# Patient Record
Sex: Female | Born: 1960
Health system: Southern US, Community
[De-identification: ages and names within clinical notes are randomized; demographics above are authoritative.]

## PROBLEM LIST (undated history)

## (undated) ENCOUNTER — Ambulatory Visit (HOSPITAL_COMMUNITY): Admission: EM | Payer: Medicare Other | Source: Home / Self Care

## (undated) DIAGNOSIS — N189 Chronic kidney disease, unspecified: Secondary | ICD-10-CM

## (undated) DIAGNOSIS — I509 Heart failure, unspecified: Secondary | ICD-10-CM

## (undated) DIAGNOSIS — I209 Angina pectoris, unspecified: Secondary | ICD-10-CM

## (undated) DIAGNOSIS — K219 Gastro-esophageal reflux disease without esophagitis: Secondary | ICD-10-CM

## (undated) DIAGNOSIS — A412 Sepsis due to unspecified staphylococcus: Secondary | ICD-10-CM

## (undated) DIAGNOSIS — R194 Change in bowel habit: Secondary | ICD-10-CM

## (undated) DIAGNOSIS — R768 Other specified abnormal immunological findings in serum: Secondary | ICD-10-CM

## (undated) DIAGNOSIS — R0600 Dyspnea, unspecified: Secondary | ICD-10-CM

## (undated) DIAGNOSIS — I4891 Unspecified atrial fibrillation: Secondary | ICD-10-CM

## (undated) DIAGNOSIS — J45909 Unspecified asthma, uncomplicated: Secondary | ICD-10-CM

## (undated) DIAGNOSIS — I10 Essential (primary) hypertension: Secondary | ICD-10-CM

## (undated) HISTORY — PX: ABDOMINAL HYSTERECTOMY: SHX81

---

## 2007-06-05 ENCOUNTER — Emergency Department (HOSPITAL_COMMUNITY): Admission: EM | Admit: 2007-06-05 | Discharge: 2007-06-05 | Payer: Self-pay | Admitting: *Deleted

## 2008-03-02 ENCOUNTER — Emergency Department (HOSPITAL_COMMUNITY): Admission: EM | Admit: 2008-03-02 | Discharge: 2008-03-02 | Payer: Self-pay | Admitting: Emergency Medicine

## 2008-03-07 ENCOUNTER — Emergency Department (HOSPITAL_COMMUNITY): Admission: EM | Admit: 2008-03-07 | Discharge: 2008-03-07 | Payer: Self-pay | Admitting: Emergency Medicine

## 2009-11-13 ENCOUNTER — Inpatient Hospital Stay (HOSPITAL_COMMUNITY): Admission: EM | Admit: 2009-11-13 | Discharge: 2009-11-16 | Payer: Self-pay | Admitting: Emergency Medicine

## 2010-08-16 LAB — CBC
HCT: 36 % (ref 36.0–46.0)
HCT: 37.2 % (ref 36.0–46.0)
HCT: 42.5 % (ref 36.0–46.0)
Hemoglobin: 12.5 g/dL (ref 12.0–15.0)
Hemoglobin: 12.6 g/dL (ref 12.0–15.0)
Hemoglobin: 14.6 g/dL (ref 12.0–15.0)
MCHC: 33.8 g/dL (ref 30.0–36.0)
MCHC: 34.3 g/dL (ref 30.0–36.0)
MCHC: 34.7 g/dL (ref 30.0–36.0)
MCV: 100.5 fL — ABNORMAL HIGH (ref 78.0–100.0)
MCV: 101.5 fL — ABNORMAL HIGH (ref 78.0–100.0)
MCV: 102.5 fL — ABNORMAL HIGH (ref 78.0–100.0)
Platelets: 102 10*3/uL — ABNORMAL LOW (ref 150–400)
Platelets: 107 10*3/uL — ABNORMAL LOW (ref 150–400)
Platelets: 99 10*3/uL — ABNORMAL LOW (ref 150–400)
RBC: 3.54 MIL/uL — ABNORMAL LOW (ref 3.87–5.11)
RBC: 3.63 MIL/uL — ABNORMAL LOW (ref 3.87–5.11)
RBC: 4.23 MIL/uL (ref 3.87–5.11)
RDW: 13 % (ref 11.5–15.5)
RDW: 13.1 % (ref 11.5–15.5)
RDW: 13.3 % (ref 11.5–15.5)
WBC: 10.6 10*3/uL — ABNORMAL HIGH (ref 4.0–10.5)
WBC: 10.6 10*3/uL — ABNORMAL HIGH (ref 4.0–10.5)
WBC: 8.9 10*3/uL (ref 4.0–10.5)

## 2010-08-16 LAB — COMPREHENSIVE METABOLIC PANEL
ALT: 81 U/L — ABNORMAL HIGH (ref 0–35)
AST: 65 U/L — ABNORMAL HIGH (ref 0–37)
Albumin: 2.8 g/dL — ABNORMAL LOW (ref 3.5–5.2)
Alkaline Phosphatase: 60 U/L (ref 39–117)
BUN: 5 mg/dL — ABNORMAL LOW (ref 6–23)
CO2: 22 mEq/L (ref 19–32)
Calcium: 8.1 mg/dL — ABNORMAL LOW (ref 8.4–10.5)
Chloride: 105 mEq/L (ref 96–112)
Creatinine, Ser: 0.9 mg/dL (ref 0.4–1.2)
GFR calc Af Amer: >60 mL/min (ref >60)
GFR calc non Af Amer: >60 mL/min (ref >60)
Glucose, Bld: 150 mg/dL — ABNORMAL HIGH (ref 70–99)
Potassium: 3.5 mEq/L (ref 3.5–5.1)
Sodium: 134 mEq/L — ABNORMAL LOW (ref 135–145)
Total Bilirubin: 0.8 mg/dL (ref 0.3–1.2)
Total Protein: 6 g/dL (ref 6.0–8.3)

## 2010-08-16 LAB — URINE MICROSCOPIC-ADD ON

## 2010-08-16 LAB — BASIC METABOLIC PANEL
BUN: 3 mg/dL — ABNORMAL LOW (ref 6–23)
CO2: 23 mEq/L (ref 19–32)
Calcium: 7.9 mg/dL — ABNORMAL LOW (ref 8.4–10.5)
Chloride: 106 mEq/L (ref 96–112)
Creatinine, Ser: 0.72 mg/dL (ref 0.4–1.2)
GFR calc Af Amer: >60 mL/min (ref >60)
GFR calc non Af Amer: >60 mL/min (ref >60)
Glucose, Bld: 92 mg/dL (ref 70–99)
Potassium: 3.7 mEq/L (ref 3.5–5.1)
Sodium: 132 mEq/L — ABNORMAL LOW (ref 135–145)

## 2010-08-16 LAB — URINALYSIS, ROUTINE W REFLEX MICROSCOPIC
Bilirubin Urine: NEGATIVE
Glucose, UA: NEGATIVE mg/dL
Ketones, ur: 15 mg/dL — AB
Nitrite: POSITIVE — AB
Protein, ur: 100 mg/dL — AB
Specific Gravity, Urine: 1.012 (ref 1.005–1.030)
Urobilinogen, UA: 1 mg/dL (ref 0.0–1.0)
pH: 6 (ref 5.0–8.0)

## 2010-08-16 LAB — URINE CULTURE: Colony Count: >=100000

## 2010-08-16 LAB — POCT I-STAT, CHEM 8
BUN: 4 mg/dL — ABNORMAL LOW (ref 6–23)
Calcium, Ion: 1.09 mmol/L — ABNORMAL LOW (ref 1.12–1.32)
Chloride: 102 mEq/L (ref 96–112)
Creatinine, Ser: 0.9 mg/dL (ref 0.4–1.2)
Glucose, Bld: 108 mg/dL — ABNORMAL HIGH (ref 70–99)
HCT: 45 % (ref 36.0–46.0)
Hemoglobin: 15.3 g/dL — ABNORMAL HIGH (ref 12.0–15.0)
Potassium: 3.1 mEq/L — ABNORMAL LOW (ref 3.5–5.1)
Sodium: 138 mEq/L (ref 135–145)
TCO2: 26 mmol/L (ref 0–100)

## 2010-08-16 LAB — CULTURE, BLOOD (ROUTINE X 2)
Culture: NO GROWTH
Culture: NO GROWTH

## 2010-08-16 LAB — HEPATITIS PANEL, ACUTE
HCV Ab: REACTIVE — AB
Hep A IgM: NEGATIVE
Hep B C IgM: NEGATIVE
Hepatitis B Surface Ag: NEGATIVE

## 2010-08-16 LAB — HEPATIC FUNCTION PANEL
ALT: 104 U/L — ABNORMAL HIGH (ref 0–35)
AST: 90 U/L — ABNORMAL HIGH (ref 0–37)
Albumin: 3.5 g/dL (ref 3.5–5.2)
Alkaline Phosphatase: 65 U/L (ref 39–117)
Bilirubin, Direct: 0.3 mg/dL (ref 0.0–0.3)
Indirect Bilirubin: 0.8 mg/dL (ref 0.3–0.9)
Total Bilirubin: 1.1 mg/dL (ref 0.3–1.2)
Total Protein: 7.2 g/dL (ref 6.0–8.3)

## 2010-08-16 LAB — DIFFERENTIAL
Basophils Absolute: 0 10*3/uL (ref 0.0–0.1)
Basophils Relative: 0 % (ref 0–1)
Eosinophils Absolute: 0 10*3/uL (ref 0.0–0.7)
Eosinophils Relative: 0 % (ref 0–5)
Lymphocytes Relative: 10 % — ABNORMAL LOW (ref 12–46)
Lymphs Abs: 1.1 10*3/uL (ref 0.7–4.0)
Monocytes Absolute: 0.9 10*3/uL (ref 0.1–1.0)
Monocytes Relative: 9 % (ref 3–12)
Neutro Abs: 8.5 10*3/uL — ABNORMAL HIGH (ref 1.7–7.7)
Neutrophils Relative %: 81 % — ABNORMAL HIGH (ref 43–77)

## 2010-08-16 LAB — PROTIME-INR
INR: 0.97 (ref 0.00–1.49)
Prothrombin Time: 12.8 seconds (ref 11.6–15.2)

## 2010-08-16 LAB — MAGNESIUM: Magnesium: 1.5 mg/dL (ref 1.5–2.5)

## 2010-08-16 LAB — LIPASE, BLOOD: Lipase: 20 U/L (ref 11–59)

## 2011-02-18 LAB — URINE MICROSCOPIC-ADD ON

## 2011-02-18 LAB — POCT PREGNANCY, URINE
Operator id: 26520
Preg Test, Ur: NEGATIVE

## 2011-02-18 LAB — URINALYSIS, ROUTINE W REFLEX MICROSCOPIC
Bilirubin Urine: NEGATIVE
Glucose, UA: NEGATIVE
Hgb urine dipstick: NEGATIVE
Ketones, ur: NEGATIVE
Leukocytes, UA: NEGATIVE
Nitrite: NEGATIVE
Protein, ur: 30 — AB
Specific Gravity, Urine: 1.013
Urobilinogen, UA: 1
pH: 6.5

## 2012-11-03 ENCOUNTER — Encounter (HOSPITAL_COMMUNITY): Payer: Self-pay | Admitting: *Deleted

## 2012-11-03 ENCOUNTER — Inpatient Hospital Stay (HOSPITAL_COMMUNITY): Payer: BC Managed Care – PPO

## 2012-11-03 ENCOUNTER — Inpatient Hospital Stay (HOSPITAL_COMMUNITY)
Admission: EM | Admit: 2012-11-03 | Discharge: 2012-11-05 | DRG: 551 | Disposition: A | Discharge status: Home / Self Care | Payer: BC Managed Care – PPO | Attending: Internal Medicine | Admitting: Internal Medicine

## 2012-11-03 ENCOUNTER — Emergency Department (HOSPITAL_COMMUNITY): Payer: BC Managed Care – PPO

## 2012-11-03 DIAGNOSIS — D696 Thrombocytopenia, unspecified: Secondary | ICD-10-CM | POA: Diagnosis present

## 2012-11-03 DIAGNOSIS — R894 Abnormal immunological findings in specimens from other organs, systems and tissues: Secondary | ICD-10-CM | POA: Diagnosis present

## 2012-11-03 DIAGNOSIS — D649 Anemia, unspecified: Secondary | ICD-10-CM

## 2012-11-03 DIAGNOSIS — K921 Melena: Secondary | ICD-10-CM

## 2012-11-03 DIAGNOSIS — K319 Disease of stomach and duodenum, unspecified: Secondary | ICD-10-CM | POA: Diagnosis present

## 2012-11-03 DIAGNOSIS — E860 Dehydration: Secondary | ICD-10-CM | POA: Diagnosis present

## 2012-11-03 DIAGNOSIS — N281 Cyst of kidney, acquired: Secondary | ICD-10-CM | POA: Diagnosis present

## 2012-11-03 DIAGNOSIS — R079 Chest pain, unspecified: Secondary | ICD-10-CM | POA: Diagnosis present

## 2012-11-03 DIAGNOSIS — I10 Essential (primary) hypertension: Secondary | ICD-10-CM

## 2012-11-03 DIAGNOSIS — K279 Peptic ulcer, site unspecified, unspecified as acute or chronic, without hemorrhage or perforation: Secondary | ICD-10-CM | POA: Diagnosis present

## 2012-11-03 DIAGNOSIS — K859 Acute pancreatitis without necrosis or infection, unspecified: Secondary | ICD-10-CM

## 2012-11-03 DIAGNOSIS — D500 Iron deficiency anemia secondary to blood loss (chronic): Secondary | ICD-10-CM | POA: Diagnosis present

## 2012-11-03 DIAGNOSIS — F17200 Nicotine dependence, unspecified, uncomplicated: Secondary | ICD-10-CM | POA: Diagnosis present

## 2012-11-03 DIAGNOSIS — K922 Gastrointestinal hemorrhage, unspecified: Secondary | ICD-10-CM

## 2012-11-03 DIAGNOSIS — R109 Unspecified abdominal pain: Secondary | ICD-10-CM

## 2012-11-03 DIAGNOSIS — R740 Nonspecific elevation of levels of transaminase and lactic acid dehydrogenase [LDH]: Secondary | ICD-10-CM

## 2012-11-03 DIAGNOSIS — K7460 Unspecified cirrhosis of liver: Secondary | ICD-10-CM

## 2012-11-03 DIAGNOSIS — K766 Portal hypertension: Secondary | ICD-10-CM | POA: Diagnosis present

## 2012-11-03 DIAGNOSIS — A498 Other bacterial infections of unspecified site: Secondary | ICD-10-CM | POA: Diagnosis present

## 2012-11-03 DIAGNOSIS — N179 Acute kidney failure, unspecified: Secondary | ICD-10-CM

## 2012-11-03 DIAGNOSIS — R7401 Elevation of levels of liver transaminase levels: Secondary | ICD-10-CM

## 2012-11-03 DIAGNOSIS — R7689 Other specified abnormal immunological findings in serum: Secondary | ICD-10-CM | POA: Diagnosis present

## 2012-11-03 DIAGNOSIS — D638 Anemia in other chronic diseases classified elsewhere: Secondary | ICD-10-CM | POA: Diagnosis present

## 2012-11-03 DIAGNOSIS — B1920 Unspecified viral hepatitis C without hepatic coma: Secondary | ICD-10-CM | POA: Diagnosis present

## 2012-11-03 DIAGNOSIS — R7402 Elevation of levels of lactic acid dehydrogenase (LDH): Secondary | ICD-10-CM | POA: Diagnosis present

## 2012-11-03 DIAGNOSIS — R768 Other specified abnormal immunological findings in serum: Secondary | ICD-10-CM | POA: Diagnosis present

## 2012-11-03 DIAGNOSIS — N390 Urinary tract infection, site not specified: Secondary | ICD-10-CM | POA: Diagnosis present

## 2012-11-03 HISTORY — DX: Angina pectoris, unspecified: I20.9

## 2012-11-03 HISTORY — DX: Unspecified asthma, uncomplicated: J45.909

## 2012-11-03 HISTORY — DX: Essential (primary) hypertension: I10

## 2012-11-03 HISTORY — DX: Other specified abnormal immunological findings in serum: R76.8

## 2012-11-03 LAB — COMPREHENSIVE METABOLIC PANEL
ALT: 113 U/L — ABNORMAL HIGH (ref 0–35)
ALT: 108 U/L — ABNORMAL HIGH (ref 0–35)
AST: 126 U/L — ABNORMAL HIGH (ref 0–37)
AST: 109 U/L — ABNORMAL HIGH (ref 0–37)
Albumin: 3.1 g/dL — ABNORMAL LOW (ref 3.5–5.2)
Albumin: 3 g/dL — ABNORMAL LOW (ref 3.5–5.2)
Alkaline Phosphatase: 97 U/L (ref 39–117)
Alkaline Phosphatase: 97 U/L (ref 39–117)
BUN: 27 mg/dL — ABNORMAL HIGH (ref 6–23)
BUN: 24 mg/dL — ABNORMAL HIGH (ref 6–23)
CO2: 22 mEq/L (ref 19–32)
CO2: 23 mEq/L (ref 19–32)
Calcium: 9.3 mg/dL (ref 8.4–10.5)
Calcium: 9 mg/dL (ref 8.4–10.5)
Chloride: 105 mEq/L (ref 96–112)
Chloride: 106 mEq/L (ref 96–112)
Creatinine, Ser: 2.51 mg/dL — ABNORMAL HIGH (ref 0.50–1.10)
Creatinine, Ser: 2.29 mg/dL — ABNORMAL HIGH (ref 0.50–1.10)
GFR calc Af Amer: 24 mL/min — ABNORMAL LOW (ref >90)
GFR calc Af Amer: 27 mL/min — ABNORMAL LOW (ref >90)
GFR calc non Af Amer: 21 mL/min — ABNORMAL LOW (ref >90)
GFR calc non Af Amer: 23 mL/min — ABNORMAL LOW (ref >90)
Glucose, Bld: 93 mg/dL (ref 70–99)
Glucose, Bld: 84 mg/dL (ref 70–99)
Potassium: 3.7 mEq/L (ref 3.5–5.1)
Potassium: 3.7 mEq/L (ref 3.5–5.1)
Sodium: 136 mEq/L (ref 135–145)
Sodium: 138 mEq/L (ref 135–145)
Total Bilirubin: 0.5 mg/dL (ref 0.3–1.2)
Total Bilirubin: 0.4 mg/dL (ref 0.3–1.2)
Total Protein: 7.1 g/dL (ref 6.0–8.3)
Total Protein: 6.9 g/dL (ref 6.0–8.3)

## 2012-11-03 LAB — CBC WITH DIFFERENTIAL/PLATELET
Basophils Absolute: 0 10*3/uL (ref 0.0–0.1)
Basophils Absolute: 0 10*3/uL (ref 0.0–0.1)
Basophils Relative: 1 % (ref 0–1)
Basophils Relative: 0 % (ref 0–1)
Eosinophils Absolute: 0.3 10*3/uL (ref 0.0–0.7)
Eosinophils Absolute: 0.3 10*3/uL (ref 0.0–0.7)
Eosinophils Relative: 5 % (ref 0–5)
Eosinophils Relative: 5 % (ref 0–5)
HCT: 32.5 % — ABNORMAL LOW (ref 36.0–46.0)
HCT: 33.4 % — ABNORMAL LOW (ref 36.0–46.0)
Hemoglobin: 11.6 g/dL — ABNORMAL LOW (ref 12.0–15.0)
Hemoglobin: 11.7 g/dL — ABNORMAL LOW (ref 12.0–15.0)
Lymphocytes Relative: 33 % (ref 12–46)
Lymphocytes Relative: 32 % (ref 12–46)
Lymphs Abs: 2.3 10*3/uL (ref 0.7–4.0)
Lymphs Abs: 2 10*3/uL (ref 0.7–4.0)
MCH: 33.8 pg (ref 26.0–34.0)
MCH: 33.4 pg (ref 26.0–34.0)
MCHC: 35.7 g/dL (ref 30.0–36.0)
MCHC: 35 g/dL (ref 30.0–36.0)
MCV: 94.8 fL (ref 78.0–100.0)
MCV: 95.4 fL (ref 78.0–100.0)
Monocytes Absolute: 0.4 10*3/uL (ref 0.1–1.0)
Monocytes Absolute: 0.3 10*3/uL (ref 0.1–1.0)
Monocytes Relative: 6 % (ref 3–12)
Monocytes Relative: 5 % (ref 3–12)
Neutro Abs: 3.9 10*3/uL (ref 1.7–7.7)
Neutro Abs: 3.7 10*3/uL (ref 1.7–7.7)
Neutrophils Relative %: 56 % (ref 43–77)
Neutrophils Relative %: 58 % (ref 43–77)
Platelets: 118 10*3/uL — ABNORMAL LOW (ref 150–400)
Platelets: 121 10*3/uL — ABNORMAL LOW (ref 150–400)
RBC: 3.43 MIL/uL — ABNORMAL LOW (ref 3.87–5.11)
RBC: 3.5 MIL/uL — ABNORMAL LOW (ref 3.87–5.11)
RDW: 13.3 % (ref 11.5–15.5)
RDW: 13.4 % (ref 11.5–15.5)
WBC: 7 10*3/uL (ref 4.0–10.5)
WBC: 6.3 10*3/uL (ref 4.0–10.5)

## 2012-11-03 LAB — PROTEIN / CREATININE RATIO, URINE
Creatinine, Urine: 54.64 mg/dL
Protein Creatinine Ratio: 2.06 — ABNORMAL HIGH (ref 0.00–0.15)
Total Protein, Urine: 112.7 mg/dL

## 2012-11-03 LAB — PROTIME-INR
INR: 0.98 (ref 0.00–1.49)
Prothrombin Time: 12.9 seconds (ref 11.6–15.2)

## 2012-11-03 LAB — URINALYSIS, ROUTINE W REFLEX MICROSCOPIC
Bilirubin Urine: NEGATIVE
Glucose, UA: NEGATIVE mg/dL
Ketones, ur: NEGATIVE mg/dL
Nitrite: NEGATIVE
Protein, ur: 100 mg/dL — AB
Specific Gravity, Urine: 1.011 (ref 1.005–1.030)
Urobilinogen, UA: 0.2 mg/dL (ref 0.0–1.0)
pH: 5.5 (ref 5.0–8.0)

## 2012-11-03 LAB — URINE MICROSCOPIC-ADD ON

## 2012-11-03 LAB — TROPONIN I
Troponin I: <0.3 ng/mL (ref <0.30)
Troponin I: <0.3 ng/mL (ref <0.30)
Troponin I: <0.3 ng/mL (ref <0.30)

## 2012-11-03 LAB — ETHANOL: Alcohol, Ethyl (B): <11 mg/dL (ref 0–11)

## 2012-11-03 LAB — APTT: aPTT: 31 seconds (ref 24–37)

## 2012-11-03 LAB — MAGNESIUM: Magnesium: 1.8 mg/dL (ref 1.5–2.5)

## 2012-11-03 LAB — LIPASE, BLOOD: Lipase: 68 U/L — ABNORMAL HIGH (ref 11–59)

## 2012-11-03 LAB — POCT I-STAT TROPONIN I: Troponin i, poc: 0 ng/mL (ref 0.00–0.08)

## 2012-11-03 LAB — PHOSPHORUS: Phosphorus: 3.3 mg/dL (ref 2.3–4.6)

## 2012-11-03 LAB — HIV ANTIBODY (ROUTINE TESTING W REFLEX): HIV: NONREACTIVE

## 2012-11-03 MED ORDER — IOHEXOL 300 MG/ML  SOLN: 25.0000 mL | INTRAMUSCULAR | Status: AC

## 2012-11-03 MED ORDER — ACETAMINOPHEN 325 MG PO TABS: 650.0000 mg | ORAL_TABLET | Freq: Four times a day (QID) | ORAL | Status: DC | PRN

## 2012-11-03 MED ORDER — SODIUM CHLORIDE 0.9 % IV SOLN
INTRAVENOUS | Status: AC
  Administered 2012-11-03: 1000 mL via INTRAVENOUS

## 2012-11-03 MED ORDER — PANTOPRAZOLE SODIUM 40 MG IV SOLR
40.0000 mg | Freq: Two times a day (BID) | INTRAVENOUS | Status: DC
  Administered 2012-11-03 – 2012-11-04 (×4): 40 mg via INTRAVENOUS
  Filled 2012-11-03 (×6): qty 40

## 2012-11-03 MED ORDER — DEXTROSE 5 % IV SOLN
1.0000 g | Freq: Once | INTRAVENOUS | Status: AC
  Administered 2012-11-03: 1 g via INTRAVENOUS
  Filled 2012-11-03: qty 10

## 2012-11-03 MED ORDER — HEPARIN SODIUM (PORCINE) 5000 UNIT/ML IJ SOLN
5000.0000 [IU] | Freq: Three times a day (TID) | INTRAMUSCULAR | Status: DC
  Administered 2012-11-03 – 2012-11-04 (×3): 5000 [IU] via SUBCUTANEOUS
  Filled 2012-11-03 (×6): qty 1

## 2012-11-03 MED ORDER — HYDROMORPHONE HCL PF 1 MG/ML IJ SOLN
1.0000 mg | Freq: Once | INTRAMUSCULAR | Status: AC
  Administered 2012-11-03: 1 mg via INTRAVENOUS
  Filled 2012-11-03: qty 1

## 2012-11-03 MED ORDER — MORPHINE SULFATE 4 MG/ML IJ SOLN: 4.0000 mg | INTRAMUSCULAR | Status: DC | PRN

## 2012-11-03 MED ORDER — DEXTROSE 5 % IV SOLN
1.0000 g | INTRAVENOUS | Status: DC
  Administered 2012-11-04 – 2012-11-05 (×2): 1 g via INTRAVENOUS
  Filled 2012-11-03 (×2): qty 10

## 2012-11-03 MED ORDER — METOPROLOL TARTRATE 25 MG PO TABS
50.0000 mg | ORAL_TABLET | Freq: Once | ORAL | Status: AC
  Administered 2012-11-03: 50 mg via ORAL
  Filled 2012-11-03: qty 2

## 2012-11-03 MED ORDER — MORPHINE SULFATE 4 MG/ML IJ SOLN
4.0000 mg | Freq: Once | INTRAMUSCULAR | Status: AC
  Administered 2012-11-03: 4 mg via INTRAVENOUS
  Filled 2012-11-03: qty 1

## 2012-11-03 MED ORDER — ONDANSETRON HCL 4 MG PO TABS: 4.0000 mg | ORAL_TABLET | Freq: Four times a day (QID) | ORAL | Status: DC | PRN

## 2012-11-03 MED ORDER — ONDANSETRON HCL 4 MG/2ML IJ SOLN
4.0000 mg | Freq: Once | INTRAMUSCULAR | Status: AC
  Administered 2012-11-03: 4 mg via INTRAVENOUS
  Filled 2012-11-03: qty 2

## 2012-11-03 MED ORDER — SODIUM CHLORIDE 0.9 % IJ SOLN
3.0000 mL | Freq: Two times a day (BID) | INTRAMUSCULAR | Status: DC
  Administered 2012-11-03 – 2012-11-04 (×2): 3 mL via INTRAVENOUS

## 2012-11-03 MED ORDER — NITROGLYCERIN 0.4 MG SL SUBL
0.4000 mg | SUBLINGUAL_TABLET | SUBLINGUAL | Status: DC | PRN
Start: 2012-11-03 — End: 2012-11-05

## 2012-11-03 MED ORDER — ONDANSETRON HCL 4 MG/2ML IJ SOLN: 4.0000 mg | Freq: Four times a day (QID) | INTRAMUSCULAR | Status: DC | PRN

## 2012-11-03 MED ORDER — LISINOPRIL 20 MG PO TABS
20.0000 mg | ORAL_TABLET | Freq: Once | ORAL | Status: DC
  Filled 2012-11-03: qty 1

## 2012-11-03 MED ORDER — SUCRALFATE 1 GM/10ML PO SUSP
1.0000 g | Freq: Three times a day (TID) | ORAL | Status: DC
  Administered 2012-11-03 – 2012-11-05 (×7): 1 g via ORAL
  Filled 2012-11-03 (×11): qty 10

## 2012-11-03 MED ORDER — HYDROCHLOROTHIAZIDE 25 MG PO TABS
25.0000 mg | ORAL_TABLET | Freq: Once | ORAL | Status: DC
  Filled 2012-11-03: qty 1

## 2012-11-03 MED ORDER — LABETALOL HCL 200 MG PO TABS
200.0000 mg | ORAL_TABLET | Freq: Two times a day (BID) | ORAL | Status: DC
  Administered 2012-11-03 – 2012-11-04 (×4): 200 mg via ORAL
  Filled 2012-11-03 (×6): qty 1

## 2012-11-03 MED ORDER — METOPROLOL TARTRATE 50 MG PO TABS
50.0000 mg | ORAL_TABLET | Freq: Two times a day (BID) | ORAL | Status: DC
  Administered 2012-11-03: 50 mg via ORAL
  Filled 2012-11-03 (×2): qty 1

## 2012-11-03 MED ORDER — ACETAMINOPHEN 650 MG RE SUPP: 650.0000 mg | Freq: Four times a day (QID) | RECTAL | Status: DC | PRN

## 2012-11-03 MED ORDER — NITROGLYCERIN 0.4 MG SL SUBL
0.4000 mg | SUBLINGUAL_TABLET | Freq: Once | SUBLINGUAL | Status: AC
  Administered 2012-11-03: 0.4 mg via SUBLINGUAL
  Filled 2012-11-03: qty 25

## 2012-11-03 MED ORDER — HYDRALAZINE HCL 20 MG/ML IJ SOLN: 10.0000 mg | INTRAMUSCULAR | Status: DC | PRN

## 2012-11-03 MED ORDER — SODIUM CHLORIDE 0.9 % IV BOLUS (SEPSIS)
1000.0000 mL | Freq: Once | INTRAVENOUS | Status: AC
  Administered 2012-11-03: 1000 mL via INTRAVENOUS

## 2012-11-03 MED ORDER — HYDROCODONE-ACETAMINOPHEN 5-325 MG PO TABS
1.0000 | ORAL_TABLET | ORAL | Status: DC | PRN
  Administered 2012-11-03 – 2012-11-05 (×4): 2 via ORAL
  Filled 2012-11-03 (×4): qty 2

## 2012-11-03 NOTE — Progress Notes (Signed)
[facility] PROGRESS NOTE  Karen Morrow [id number] DOB: November 16, 1960 DOA: 11/03/2012 PCP: No primary provider on file.  Assessment/Plan  Abdominal pain, possibly due to acute pancreatitis however the lipase level was only minimally elevated.  CT scan demonstrates antero-pyloric wall thickening of the stomach suggestive of gastritis or peptic ulcer disease.  Fullness of the head of the pancreas may be suggestive of pancreatitis. There was no discrete mass demonstrated on CT or on ultrasound. -  Start PPI and Carafate -  Advance diet as tolerated -  antiemetics -  analgesia with morphine 4 mg Q 3 hours IV PRN   Chest pain, likely referred pain from abdominal pain; rule out ACS  - troponin neg x 1 - EKG pending - morphine for pain control  - nitroglycerin for pain control   Elevated transaminases trending down. May have been due to severe dehydration but patient may also have chronic hepatitis C. - ethanol level negative  -  Liver US demonstrated heterogeneous hepatic parenchyma and a slightly lobular contour suggestive of cirrhosis. -  Hepatitis C antibody test from 2011 was positive -  Send qualitative PCR for Hep C RNA -  Patient consented to HIV testing so will order for next blood draw  UTI suggested by 11-20 WBCs and many bacteria on urinalysis - follow up urine culture results  - continue rocephin 1 gm daily IV   Accelerated hypertension, with labile blood pressures ranging from 0000000 systolic -  Change metoprolol to labetalol and add when necessary hydralazine -  Control pain  Anemia, hemoglobin 11.6  - continue to monitor CBC   Acute renal failure, likely due to dehydration and improving with IV fluids.  Patient may also have some underlying chronic kidney disease as her last creatinine was done in 2011 and despite elevated creatinine she does not have any evidence of acidosis or hyperkalemia. - continue IV fluids -  Urine protein to creatinine ratio due to  elevated protein on UA  Thrombocytopenia, likely due to acute illness. Resolving - Repeat in the morning  Left renal cyst, stable from prior Right-sided vaginal prolapse, stable from prior  Diet:  Clear liquid, advance as tolerated Access:  PIV IVF:  Normal saline at 75 mL per hour Proph:  Heparin  Code Status: Full code Family Communication: Spoke with patient alone Disposition Plan: Pending improvement in blood pressure, creatinine   Consultants:  None  Procedures:  CT abdomen and pelvis  Ultrasound abdomen complete  Chest x-ray  Antibiotics:  None   HPI/Subjective:  The patient states that her periumbilical pain resolved but is now slowly returning.n  she is otherwise feeling well and would like to eat.  When asked if she had ever been diagnosed with hepatitis she said no. She agrees to testing for HIV. She states she drinks 2 or 3 beers occasionally on the weekends.  Objective: Filed Vitals:   11/03/12 0630 11/03/12 0645 11/03/12 0830 11/03/12 1000  BP: 139/102 154/113 168/113 182/93  Pulse: 70 69 66 72  Temp:    97.2 F (36.2 C)  TempSrc:    Oral  Resp: 15 14 17 18   Height:    5\' 7"  (1.702 m)  Weight:    61.8 kg (136 lb 3.9 oz)  SpO2: 98% 99% 98% 93%    Intake/Output Summary (Last 24 hours) at 11/03/12 1324 Last data filed at 11/03/12 0849  Gross per 24 hour  Intake   1000 ml  Output      0 ml  Net   1000 ml   Filed Weights   11/03/12 1000  Weight: 61.8 kg (136 lb 3.9 oz)    Exam:   General:  African American female,  No acute distress  HEENT:  NCAT, MMM  Cardiovascular:  RRR, nl S1, S2 no mrg, 2+ pulses, warm extremities  Respiratory:  CTAB, no increased WOB  Abdomen:  NABS, soft, NT/ND  MSK:   Normal tone and bulk, no LEE  Neuro:  Grossly intact  Data Reviewed: Basic Metabolic Panel:  Recent Labs Lab 11/03/12 0324 11/03/12 1028  NA 136 138  K 3.7 3.7  CL 105 106  CO2 22 23  GLUCOSE 93 84  BUN 27* 24*  CREATININE  2.51* 2.29*  CALCIUM 9.3 9.0  MG  --  1.8  PHOS  --  3.3   Liver Function Tests:  Recent Labs Lab 11/03/12 0324 11/03/12 1028  AST 126* 109*  ALT 113* 108*  ALKPHOS 97 97  BILITOT 0.5 0.4  PROT 7.1 6.9  ALBUMIN 3.1* 3.0*    Recent Labs Lab 11/03/12 0324  LIPASE 68*   No results found for this basename: AMMONIA,  in the last 168 hours CBC:  Recent Labs Lab 11/03/12 0324 11/03/12 1028  WBC 7.0 6.3  NEUTROABS 3.9 3.7  HGB 11.6* 11.7*  HCT 32.5* 33.4*  MCV 94.8 95.4  PLT 118* 121*   Cardiac Enzymes:  Recent Labs Lab 11/03/12 1028  TROPONINI <0.30   BNP (last 3 results) No results found for this basename: PROBNP,  in the last 8760 hours CBG: No results found for this basename: GLUCAP,  in the last 168 hours  No results found for this or any previous visit (from the past 240 hour(s)).   Studies: Ct Abdomen Pelvis Wo Contrast  11/03/2012   *RADIOLOGY REPORT*  Clinical Data: Left lower quadrant abdominal pain.  CT ABDOMEN AND PELVIS WITHOUT CONTRAST  Technique:  Multidetector CT imaging of the abdomen and pelvis was performed following the standard protocol without intravenous contrast.  Comparison: 11/13/2009.  Findings: The lung bases are clear except for dependent left basilar atelectasis.  The liver is unremarkable.  No focal hepatic lesions or intrahepatic ductal dilatation.  The spleen is normal.  The pancreas is unremarkable.  No biliary dilatation.  The gallbladder is normal. The adrenal glands and kidneys are unremarkable except for a hyperdense/hemorrhagic left renal cyst  The stomach demonstrates antral pyloric wall thickening.  Cannot exclude gastritis or peptic ulcer disease.  The duodenum, small bowel and colon are unremarkable.  No inflammatory changes or mass lesions.  The appendix is normal.  No mesenteric or retroperitoneal mass or adenopathy.  Advanced aortic calcifications are stable.  The bladder is unremarkable.  The uterus is surgically absent.  There appears to be  vaginal prolapse on the right versus a large Bartholin gland cyst, unchanged since 2011.  Moderate swelling and edema involving the labia are noted.  The bony structures are unremarkable.  Advanced progressive degenerative disc disease is noted at L5-S1 with discogenic sclerosis.  IMPRESSION:  1. Antropyloric wall thickening of the stomach could reflect changes of gastritis or peptic ulcer disease. 2.  Stable hyperdense/hemorrhagic left renal cyst. 3. Stable right-sided vaginal prolapse versus large Bartholin gland cyst. 4.  Marked soft tissue swelling/edema involving the labia. 5.  Significant and progressive degenerative disc disease at L5-S1.   Original Report Authenticated By: Marijo Sanes, M.D.   Dg Chest 2 View  11/03/2012   *RADIOLOGY REPORT*  Clinical Data:  Chest pain and epigastric abdominal pain.  History of asthma and smoking.  CHEST - 2 VIEW  Comparison: None.  Findings: The lungs are well-aerated and clear.  There is no evidence of focal opacification, pleural effusion or pneumothorax.  The heart is normal in size; the mediastinal contour is within normal limits.  No acute osseous abnormalities are seen.  IMPRESSION: No acute cardiopulmonary process seen.   Original Report Authenticated By: Santa Lighter, M.D.   US Abdomen Complete  11/03/2012   *RADIOLOGY REPORT*  Clinical Data:  History given of abnormal liver function tests. History of abdominal pain.  History of elevated creatinine level of 2.5.  ABDOMINAL ULTRASOUND COMPLETE  Comparison:  CT 11/13/2009.  Findings:  Gallbladder: No shadowing gallstones or echogenic sludge. No gallbladder wall thickening or pericholecystic fluid. The gallbladder wall thickness measured 1.9  mm. No sonographic Murphy's sign according to the ultrasound technologist. There is a 4 mm gallbladder polyp.  CBD: Normal in caliber measuring 2.4 mm. No choledocholithiasis is evident.  Liver:  Normal size cysts with mildly heterogeneous parenchymal  echotexture without evidence of hepatic mass.  There is slight surface lobular contour on some images. Portal vein is patent with hepatopetal flow.  Hepatic veins appear patent.  IVC:  Patent throughout its visualized course in the abdomen.  Pancreas:  Although the pancreas is difficult to visualize in its entirety, no focal pancreatic abnormality is identified.  There is slight fullness of the head of the pancreas without discrete mass by ultrasound.  Spleen:  Normal size and echotexture without focal abnormality. Length is 7 cm.  Right kidney:  No hydronephrosis.  Well-preserved cortex.  Slightly increased echogenicity of the renal parenchymal echotexture without focal abnormalities.  Right renal length is 10.2 cm.  Left kidney:  No hydronephrosis.  Well-preserved cortex. Increased echogenicity of the renal parenchymal echotexture. There is a hypoechoic area consistent with a small cyst which measures 1.8 x 1.4 x 1.4 cm.  It appears simple.  Left renal length is 9.6 cm.  Aorta:  Maximum diameter is 2.5 cm.  No aneurysm is evident.  Ascites:  None.  IMPRESSION: No cholelithiasis or evidence of cholecystitis is seen. Bile ducts appeared normal.  There is a small gallbladder polyp.  Hepatic parenchyma is somewhat heterogeneous.  No hepatic mass is evident.  There is a slightly lobular contour of the surface of the liver on some images.  This can be associated with cirrhosis.  Fullness of the head of the pancreas without discrete mass identified.  Increased echogenicity of the parenchyma of both kidneys.  This may be associated with chronic medical renal disease.  Left renal cyst.   Original Report Authenticated By: Shanon Brow Call    Scheduled Meds: . cefTRIAXone (ROCEPHIN)  IV  1 g Intravenous Q24H  . heparin subcutaneous  5,000 Units Subcutaneous Q8H  . labetalol  200 mg Oral BID  . pantoprazole (PROTONIX) IV  40 mg Intravenous Q12H  . sodium chloride  3 mL Intravenous Q12H  . sucralfate  1 g Oral TID WC & HS    Continuous Infusions: . 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 If 7PM-7AM, please contact night-coverage at [email or web address], password [facility] 11/03/2012, 1:24 PM  LOS: 0 days

## 2012-11-03 NOTE — Evaluation (Signed)
Physical Therapy Evaluation Patient Details Name: Karen Morrow MRN: [id number] DOB: 1961-02-06 Today's Date: 11/03/2012 Time: [id number] PT Time Calculation (min): 14 min  PT Assessment / Plan / Recommendation Clinical Impression  Patient is a 52 yo female admitted with abdominal and chest pain.  Patient is independent with all mobility and gait.  Patient with good balance.  No acute PT needs identified - PT will sign off.  Encouraged ambulation in hallway with nursing.    PT Assessment  Patent does not need any further PT services    Follow Up Recommendations  No PT follow up;Supervision - Intermittent    Does the patient have the potential to tolerate intense rehabilitation      Barriers to Discharge        Equipment Recommendations  None recommended by PT    Recommendations for Other Services     Frequency      Precautions / Restrictions Precautions Precautions: None Restrictions Weight Bearing Restrictions: No   Pertinent Vitals/Pain       Mobility  Bed Mobility Bed Mobility: Supine to Sit;Sit to Supine Supine to Sit: 7: Independent;HOB flat Sit to Supine: 7: Independent;HOB flat Details for Bed Mobility Assistance: No cues or assist needed Transfers Transfers: Sit to Stand;Stand to Sit Sit to Stand: 7: Independent;From bed Stand to Sit: 7: Independent;To bed Details for Transfer Assistance: No cues or assist needed. Ambulation/Gait Ambulation/Gait Assistance: 7: Independent Ambulation Distance (Feet): 220 Feet Assistive device: None Ambulation/Gait Assistance Details: No cues or assist needed.  Patient with good gait pattern and balance. Gait Pattern: Within Functional Limits Gait velocity: WFL      PT Goals  N/A  Visit Information  Last PT Received On: 11/03/12 Assistance Needed: +1    Subjective Data  Subjective: "I want to take a shower tonight" Patient Stated Goal: To go home soon   Prior [place] Lives With:  Spouse Available Help at Discharge: Family;Available 24 hours/day Type of Home: Apartment Home Access: Level entry Home Layout: One level Home Adaptive Equipment: None Prior Function Level of Independence: Independent Able to Take Stairs?: Yes Driving: No Vocation: Unemployed Communication Communication: No difficulties    Cognition  Cognition Arousal/Alertness: Awake/alert Behavior During Therapy: WFL for tasks assessed/performed Overall Cognitive Status: Within Functional Limits for tasks assessed (Decreased insight and safety - pta as well)    Extremity/Trunk Assessment Right Upper Extremity Assessment RUE ROM/Strength/Tone: WFL for tasks assessed RUE Sensation: WFL - Light Touch Left Upper Extremity Assessment LUE ROM/Strength/Tone: WFL for tasks assessed LUE Sensation: WFL - Light Touch Right Lower Extremity Assessment RLE ROM/Strength/Tone: WFL for tasks assessed RLE Sensation: WFL - Light Touch Left Lower Extremity Assessment LLE ROM/Strength/Tone: WFL for tasks assessed LLE Sensation: WFL - Light Touch Trunk Assessment Trunk Assessment: Normal   Balance Balance Balance Assessed: Yes High Level Balance High Level Balance Activites: Direction changes;Turns;Sudden stops;Head turns (Stepping over obstacles) High Level Balance Comments: No loss of balance with high level balance activities.  End of Session PT - End of Session Activity Tolerance: Patient tolerated treatment well Patient left: in bed;with call bell/phone within reach Nurse Communication: Mobility status (Encouraged ambulation with nursing in hallway)  GP     Despina Pole 11/03/2012, 6:59 PM  Carita Pian. Sanjuana Kava, [place] Pager [phone]

## 2012-11-03 NOTE — Discharge Summary (Signed)
Physician Discharge Summary  Karen Morrow [id number] DOB: Jan 16, 1961 DOA: 11/03/2012  PCP: No primary provider on file.  Admit date: 11/03/2012 Discharge date: 11/05/2012  Recommendations for Outpatient Follow-up:  1. Followup with [facility] gastroenterology within one week of discharge for ongoing melena. Please also followup on qualitative PCR for hepatitis C RNA.  2. Followup with primary care doctor within one week of discharge.   Please check blood pressure and repeat CBC and BMP to followup on anemia, thrombocytopenia, acute kidney injury now likely chronic kidney disease.    Discharge Diagnoses:  Principal Problem:   Abdominal pain Active Problems:   Chest pain   Accelerated hypertension   Anemia   Acute renal failure   Thrombocytopenia   UTI (lower urinary tract infection)   Elevated transaminase level   Hepatitis C antibody test positive   Cirrhosis of liver without mention of alcohol, suggested by abdominal US   Upper GI bleed   Melena   Discharge Condition: Stable, improved  Diet recommendation: Low-sodium healthy  Wt Readings from Last 3 Encounters:  11/05/12 61.689 kg (136 lb)    History of present illness:  52 year old female with on significant past medical history who presented to [facility] for evaluation of persistent mid abdominal pain for past few days prior to this admission, 7-8/10 in intensity, occasionally radiating to back. Patient reported no associated vomiting but has some nausea intermittently. She also had complaints of retrosternal chest pain for the same amount of time and about same intensity, 6-7/10 in intensity, non radiating. Pain was relieved with SL nitro given in ED. No complaints of shortness of breath, palpitations. No fever or chills. No diarrhea or constipation. No blood in stool or urine.   In ED, her vital signs were as follows: BP 148/104 then 207/124 which subsequently came down with PO metoprolol and SL nitro to 148/104. HR ranges 80 -83, O2  saturation is 98% while breathing ambient air. Tmax = 97.9 F. Her CBC revealed hemoglobin of 11.6 and thrombocytopenia of 118. BMP showed creatinine elevated at 2.51 ( prior values WNL).  Hospital Course:  Abdominal pain, probably due to gastritis or peptic ulcer disease although given the suggestion of cirrhosis on her abdominal ultrasound, she may have some portal hypertensive gastropathy.  Her CT of abdomen and pelvis demonstrated antropyloric wall thickening of the stomach suggestive of gastritis or peptic ulcer disease. Fullness of the head of the pancreas may be suggestive of pancreatitis however her lipase was not very elevated. There was no discrete mass demonstrated on CT or on ultrasound.  The possibility of gastritis or peptic ulcer disease is also supported by them mild to moderate amount of melanoma the patient has described over the last week.  Hemoccult was positive.  Her hemoglobin remains around 10-11 mg/dL.  With the addition of protonix and Carafate, her melena appeared to be decrease as did her abdominal pain.   She will need close followup with gastroenterology. I spoke with the on-call gastroenterologist from [facility] on 6/7 to see if she could get an early appointment for sometime this week.  The patient is aware to call the clinic on Monday to schedule an appointment for soon as possible.    Chest pain, likely referred pain from abdominal pain.  Troponin neg x 3, Telemetry:  NSR  Elevated transaminases trending down. May have been due to severe dehydration but patient may also have chronic hepatitis C.   -  Ethanol level negative  - Liver US  demonstrated heterogeneous hepatic parenchyma and a slightly lobular contour suggestive of cirrhosis.  - Hepatitis C antibody test from 2011 was positive  - Qualitative PCR for Hep C RNA  - HIV nonreactive   E. Coli UTI, completed 3 days of ceftriaxone.  Accelerated hypertension, with labile blood pressures ranging from 0000000 systolic.   Her blood pressures were well controlled on labetalol.  Anemia, likely due to iron deficiency from GI bleed.  Will defer initiation of iron supplementation for now until she follows up with gastroenterology.    Acute versus chronic renal failure.  Patient may also have some underlying chronic kidney disease as her last creatinine was done in 2011.  Despite elevated creatinine she does not have any evidence of acidosis or hyperkalemia and her creatinine did not improve with IV fluids.  She denies dehydration, but does use NSAIDs.  She may have HCV nephropathy.  She had an elevated protein to creatinine ratio of 2.06.   She only followup with her primary care doctor for referral to nephrology.  No evidence of uremia or hypervolemia.  Repeat BMP in one week.    Thrombocytopenia, likely due to acute illness versus cirrhosis. Resolving.  INR was 0.98.  Followup CBC as an outpatient in one week  Left renal cyst, stable from prior  Right-sided vaginal prolapse, stable from prior  Consultants:  None Procedures:  CT abdomen and pelvis  Ultrasound abdomen complete  Chest x-ray Antibiotics:  None    Discharge Exam: Filed Vitals:   11/05/12 0708  BP: 131/90  Pulse: 69  Temp: 97.4 F (36.3 C)  Resp: 18   Filed Vitals:   11/04/12 0639 11/04/12 1453 11/04/12 2046 11/05/12 0708  BP: 135/103 124/89 136/88 131/90  Pulse:  80 70 69  Temp:  97.9 F (36.6 C) 97 F (36.1 C) 97.4 F (36.3 C)  TempSrc:  Oral Oral Oral  Resp:  18 19 18   Height:      Weight:    61.689 kg (136 lb)  SpO2: 99% 100% 99% 96%   Patient states that her last stool was a mixture of greenish brown stool and black stool.  She denies abdominal pain and was able to eat well.  She denies lightheadedness, chest pain, shortness of breath  General: African American female, No acute distress  HEENT: NCAT, MMM  Cardiovascular: RRR, nl S1, S2 no mrg, 2+ pulses, warm extremities  Respiratory: CTAB, no increased WOB  Abdomen:  NABS, soft, NT/ND  MSK: Normal tone and bulk, no LEE  Neuro: Grossly intact   Discharge Instructions      Discharge Orders   Future Orders Complete By Expires     Call MD for:  difficulty breathing, headache or visual disturbances  As directed     Call MD for:  extreme fatigue  As directed     Call MD for:  hives  As directed     Call MD for:  persistant dizziness or light-headedness  As directed     Call MD for:  persistant nausea and vomiting  As directed     Call MD for:  severe uncontrolled pain  As directed     Call MD for:  temperature >100.4  As directed     Diet - low sodium heart healthy  As directed     Discharge instructions  As directed     Comments:      You were hospitalized with abdominal pain, and you were found to have probably gastritis or  peptic ulcer disease. Please stop taking ibuprofen, and Naprosyn, Aleve.  You may use rare doses of Tylenol as needed for pain until you followup with a gastroenterologist. Please use protonic twice a day and Carafate 4 times a day until you followup with gastroenterology. Please review the instructions regarding peptic ulcer disease and food appropriate for gastritis carefully.  You also had some evidence of mild kidney failure.  Despite hydration, and your kidney function did not appear to improve. He will need to followup with the kidney specialists, the nephrologists, within the next 2-4 weeks. Your primary care doctor may give you a referral, however I have included the phone number for [facility] in her discharge instructions.  Please stay hydrated and again avoid ibuprofen and the other medications listed above.  You had high blood pressure, and were started on a medication called labetalol. Please take this medication twice a day. Her primary care doctor may make adjustments to your blood pressure medications based on your blood pressures in clinic. You will need to have blood checked in one week by her primary care  doctor or by gastroenterology. There are some pending blood tests that will need to be followed up on.  Please bring your discharge paperwork to your followup appointments so the physicians know what blood tests we need to check.  You completed treatment for a urinary tract infection while in the hospital.    Increase activity slowly  As directed         Medication List    STOP taking these medications       ADVIL PM 200-38 MG Tabs  Generic drug:  Ibuprofen-Diphenhydramine Cit     ibuprofen 200 MG tablet  Commonly known as:  ADVIL,MOTRIN      TAKE these medications       labetalol 200 MG tablet  Commonly known as:  NORMODYNE  Take 1 tablet (200 mg total) by mouth 2 (two) times daily.     pantoprazole 40 MG tablet  Commonly known as:  PROTONIX  Take 1 tablet (40 mg total) by mouth 2 (two) times daily.     sucralfate 1 G tablet  Commonly known as:  CARAFATE  Take 1 tablet (1 g total) by mouth 4 (four) times daily.     TYLENOL PM EXTRA STRENGTH 25-500 MG Tabs  Generic drug:  diphenhydramine-acetaminophen  Take 2 tablets by mouth at bedtime as needed.       Follow-up Information   Follow up with Primary care doctor.      Follow up with [facility]. Schedule an appointment as soon as possible for a visit in 1 week.   Contact information:   [place] [address] [phone]      Follow up with [facility]. Schedule an appointment as soon as possible for a visit in 1 month.   Contact information:   [place] [phone]       The results of significant diagnostics from this hospitalization (including imaging, microbiology, ancillary and laboratory) are listed below for reference.    Significant Diagnostic Studies: Ct Abdomen Pelvis Wo Contrast  11/03/2012   *RADIOLOGY REPORT*  Clinical Data: Left lower quadrant abdominal pain.  CT ABDOMEN AND PELVIS WITHOUT CONTRAST  Technique:   Multidetector CT imaging of the abdomen and pelvis was performed following the standard protocol without intravenous contrast.  Comparison: 11/13/2009.  Findings: The lung bases are clear except for dependent left  basilar atelectasis.  The liver is unremarkable.  No focal hepatic lesions or intrahepatic ductal dilatation.  The spleen is normal.  The pancreas is unremarkable.  No biliary dilatation.  The gallbladder is normal. The adrenal glands and kidneys are unremarkable except for a hyperdense/hemorrhagic left renal cyst  The stomach demonstrates antral pyloric wall thickening.  Cannot exclude gastritis or peptic ulcer disease.  The duodenum, small bowel and colon are unremarkable.  No inflammatory changes or mass lesions.  The appendix is normal.  No mesenteric or retroperitoneal mass or adenopathy.  Advanced aortic calcifications are stable.  The bladder is unremarkable.  The uterus is surgically absent. There appears to be  vaginal prolapse on the right versus a large Bartholin gland cyst, unchanged since 2011.  Moderate swelling and edema involving the labia are noted.  The bony structures are unremarkable.  Advanced progressive degenerative disc disease is noted at L5-S1 with discogenic sclerosis.  IMPRESSION:  1. Antropyloric wall thickening of the stomach could reflect changes of gastritis or peptic ulcer disease. 2.  Stable hyperdense/hemorrhagic left renal cyst. 3. Stable right-sided vaginal prolapse versus large Bartholin gland cyst. 4.  Marked soft tissue swelling/edema involving the labia. 5.  Significant and progressive degenerative disc disease at L5-S1.   Original Report Authenticated By: Marijo Sanes, M.D.   Dg Chest 2 View  11/03/2012   *RADIOLOGY REPORT*  Clinical Data: Chest pain and epigastric abdominal pain.  History of asthma and smoking.  CHEST - 2 VIEW  Comparison: None.  Findings: The lungs are well-aerated and clear.  There is no evidence of focal opacification, pleural effusion or  pneumothorax.  The heart is normal in size; the mediastinal contour is within normal limits.  No acute osseous abnormalities are seen.  IMPRESSION: No acute cardiopulmonary process seen.   Original Report Authenticated By: Santa Lighter, M.D.   US Abdomen Complete  11/03/2012   *RADIOLOGY REPORT*  Clinical Data:  History given of abnormal liver function tests. History of abdominal pain.  History of elevated creatinine level of 2.5.  ABDOMINAL ULTRASOUND COMPLETE  Comparison:  CT 11/13/2009.  Findings:  Gallbladder: No shadowing gallstones or echogenic sludge. No gallbladder wall thickening or pericholecystic fluid. The gallbladder wall thickness measured 1.9  mm. No sonographic Murphy's sign according to the ultrasound technologist. There is a 4 mm gallbladder polyp.  CBD: Normal in caliber measuring 2.4 mm. No choledocholithiasis is evident.  Liver:  Normal size cysts with mildly heterogeneous parenchymal echotexture without evidence of hepatic mass.  There is slight surface lobular contour on some images. Portal vein is patent with hepatopetal flow.  Hepatic veins appear patent.  IVC:  Patent throughout its visualized course in the abdomen.  Pancreas:  Although the pancreas is difficult to visualize in its entirety, no focal pancreatic abnormality is identified.  There is slight fullness of the head of the pancreas without discrete mass by ultrasound.  Spleen:  Normal size and echotexture without focal abnormality. Length is 7 cm.  Right kidney:  No hydronephrosis.  Well-preserved cortex.  Slightly increased echogenicity of the renal parenchymal echotexture without focal abnormalities.  Right renal length is 10.2 cm.  Left kidney:  No hydronephrosis.  Well-preserved cortex. Increased echogenicity of the renal parenchymal echotexture. There is a hypoechoic area consistent with a small cyst which measures 1.8 x 1.4 x 1.4 cm.  It appears simple.  Left renal length is 9.6 cm.  Aorta:  Maximum diameter is 2.5 cm.  No  aneurysm is evident.  Ascites:  None.  IMPRESSION: No cholelithiasis or evidence of cholecystitis is seen. Bile ducts appeared normal.  There is a small gallbladder polyp.  Hepatic parenchyma is somewhat heterogeneous.  No hepatic mass is evident.  There is a slightly lobular contour of the surface of the liver on some images.  This can be associated with cirrhosis.  Fullness of the head of the pancreas without discrete mass identified.  Increased echogenicity of the parenchyma of both kidneys.  This may be associated with chronic medical renal disease.  Left renal cyst.   Original Report Authenticated By: Shanon Brow Call    Microbiology: Recent Results (from the past 240 hour(s))  URINE CULTURE     Status: None   Collection Time    11/03/12  3:25 AM      Result Value Range Status   Specimen Description URINE, CLEAN CATCH   Final   Special Requests CX ADDED AT 0411 ON [id number]   Final   Culture  Setup Time 11/03/2012 03:25   Final   Colony Count >=100,000 COLONIES/ML   Final   Culture ESCHERICHIA COLI   Final   Report Status 11/04/2012 FINAL   Final   Organism ID, Bacteria ESCHERICHIA COLI   Final     Labs: Basic Metabolic Panel:  Recent Labs Lab 11/03/12 0324 11/03/12 1028 11/04/12 0420  NA 136 138 139  K 3.7 3.7 3.7  CL 105 106 108  CO2 22 23 22   GLUCOSE 93 84 87  BUN 27* 24* 24*  CREATININE 2.51* 2.29* 2.69*  CALCIUM 9.3 9.0 8.9  MG  --  1.8  --   PHOS  --  3.3  --    Liver Function Tests:  Recent Labs Lab 11/03/12 0324 11/03/12 1028 11/04/12 0420  AST 126* 109* 111*  ALT 113* 108* 106*  ALKPHOS 97 97 90  BILITOT 0.5 0.4 0.3  PROT 7.1 6.9 6.2  ALBUMIN 3.1* 3.0* 2.7*    Recent Labs Lab 11/03/12 0324  LIPASE 68*   No results found for this basename: AMMONIA,  in the last 168 hours CBC:  Recent Labs Lab 11/03/12 0324 11/03/12 1028 11/04/12 0420 11/04/12 1353 11/05/12 0505  WBC 7.0 6.3 6.6 6.8 6.0  NEUTROABS 3.9 3.7  --   --   --   HGB 11.6* 11.7* 10.7*  11.6* 10.9*  HCT 32.5* 33.4* 30.1* 32.9* 29.8*  MCV 94.8 95.4 94.4 95.6 93.7  PLT 118* 121* 118* 118* 112*   Cardiac Enzymes:  Recent Labs Lab 11/03/12 1028 11/03/12 1509 11/03/12 2203  TROPONINI <0.30 <0.30 <0.30   BNP: BNP (last 3 results) No results found for this basename: PROBNP,  in the last 8760 hours CBG:  Recent Labs Lab 11/04/12 0630  GLUCAP 79    Time coordinating discharge: 45 minutes  Signed:  Marce Charlesworth  [facility] 11/05/2012, 9:22 AM

## 2012-11-03 NOTE — Progress Notes (Signed)
Utilization  Review completed.    [place] [facility].

## 2012-11-03 NOTE — H&P (Addendum)
[facility] History and Physical  Karen Morrow [id number] DOB: 07-01-1960 DOA: 11/03/2012  Referring physician: ER physician PCP: No primary provider on file.   Chief Complaint: abdominal pain 52 year old female with on significant past medical history who presented to [facility] for evaluation of persistent mid abdominal pain for past few days prior to this admission, 7-8/10 in intensity, occasionally radiating to back. Patient reported no associated vomiting but has some nausea intermittently. She also had complaints of retrosternal chest pain for the same amount of time and about same intensity, 6-7/10 in intensity, non radiating. Pain was relieved with SL nitro given in ED. No complaints of shortness of breath, palpitations. No fever or chills. No diarrhea or constipation. No blood in stool or urine. In ED, her vital signs were as follows: BP 148/104 then 207/124 which subsequently came down with PO metoprolol and SL nitro to 148/104. HR ranges 80 -83, O2 saturation is 98% while breathing ambient air. Tmax = 97.9 F. Her CBC revealed hemoglobin of 11.6 and thrombocytopenia of 118. BMP showed creatinine elevated at 2.51 ( prior values WNL).  Assessment and Plan:  Principal Problem:   Abdominal pain - possibly due to acute pancreatitis - lipase level is elevated at 68 on this admission - keep NPO for now and continue supportive care with IV fluids, antiemetics and analgesia with morphine 4 mg Q 3 hours IV PRN Active Problems:   Chest pain - likely referred pain from abdominal pain; rule out ACS - cycle cardiac enzyme; obtain 12 lead EKG - oxygen support via nasal canula as needed - morphine for pain control - nitroglycerin for pain control   UTI - follow up urine culture results - continue rocephin 1 gm daily IV   Acute pancreatitis - lipase elevated at 68; management as above with NPO, antiemetics and analgesia - continue IV fluids   Accelerated hypertension - now BP 159/96;  down with metoprolol 50 mg so we will continue this regimen, metoprolol 50 mg PO BID and nitroglycerin as needed to help with chest pain   Anemia - hemoglobin 11.6 - continue to monitor CBC   Acute renal failure - likely dehydration - continue IV fluids and follow up BMP in am   Thrombocytopenia - unclear etiology - no active bleed   Elevated transaminases - unclear etiology - obtain ethanol level and liver US  Karen Morrow [facility] [id number]  Review of Systems:  Constitutional: Negative for fever, chills and malaise/fatigue. Negative for diaphoresis.  HENT: Negative for hearing loss, ear pain, nosebleeds, congestion, sore throat, neck pain, tinnitus and ear discharge.   Eyes: Negative for blurred vision, double vision, photophobia, pain, discharge and redness.  Respiratory: Negative for cough, hemoptysis, sputum production, shortness of breath, wheezing and stridor.   Cardiovascular: positive for chest pain, no palpitations, orthopnea, claudication and leg swelling.  Gastrointestinal: Negative for nausea, vomiting and positive for abdominal pain. Negative for heartburn, constipation, blood in stool and melena.  Genitourinary: Negative for dysuria, urgency, frequency, hematuria and flank pain.  Musculoskeletal: Negative for myalgias, back pain, joint pain and falls.  Skin: Negative for itching and rash.  Neurological: Negative for dizziness and weakness. Negative for tingling, tremors, sensory change, speech change, focal weakness, loss of consciousness and headaches.  Endo/Heme/Allergies: Negative for environmental allergies and polydipsia. Does not bruise/bleed easily.  Psychiatric/Behavioral: Negative for suicidal ideas. The patient is not nervous/anxious.      Past Medical History  Diagnosis Date  . Asthma    Past Surgical  History  Procedure Laterality Date  . Abdominal hysterectomy     Social History:  reports that she has been smoking.  She does not have any smokeless tobacco  history on file. She reports that  drinks alcohol. She reports that she uses illicit drugs (Marijuana).  No Known Allergies  Family History: Family medical history significant for HTN, HLD   Prior to Admission medications   Medication Sig Start Date End Date Taking? Authorizing Provider  diphenhydramine-acetaminophen (TYLENOL PM EXTRA STRENGTH) 25-500 MG TABS Take 2 tablets by mouth at bedtime as needed.   Yes Historical Provider, MD  ibuprofen (ADVIL,MOTRIN) 200 MG tablet Take 400 mg by mouth once.   Yes Historical Provider, MD  Ibuprofen-Diphenhydramine Cit (ADVIL PM) 200-38 MG TABS Take 2 tablets by mouth at bedtime as needed (for sleep).   Yes Historical Provider, MD   Physical Exam: Filed Vitals:   11/03/12 0437 11/03/12 0445 11/03/12 0500 11/03/12 0515  BP: 176/117 184/117 151/110 148/104  Pulse: 83 81 81 82  Temp:      TempSrc:      Resp:  22 18 22   SpO2:  100% 99% 100%    Physical Exam  Constitutional: Appears well-developed and well-nourished. No distress.  HENT: Normocephalic. External right and left ear normal. Oropharynx is clear and moist.  Eyes: Conjunctivae and EOM are normal. PERRLA, no scleral icterus.  Neck: Normal ROM. Neck supple. No JVD. No tracheal deviation. No thyromegaly.  CVS: RRR, S1/S2 +, no murmurs, no gallops, no carotid bruit.  Pulmonary: Effort and breath sounds normal, no stridor, rhonchi, wheezes, rales.  Abdominal: Soft. BS +,  no distension, tenderness across mid abdomen,no  rebound or guarding.  Musculoskeletal: Normal range of motion. No edema and no tenderness.  Lymphadenopathy: No lymphadenopathy noted, cervical, inguinal. Neuro: Alert. Normal reflexes, muscle tone coordination. No cranial nerve deficit. Skin: Skin is warm and dry. No rash noted. Not diaphoretic. No erythema. No pallor.  Psychiatric: Normal mood and affect. Behavior, judgment, thought content normal.   Labs on Admission:  Basic Metabolic Panel:  Recent Labs Lab  11/03/12 0324  NA 136  K 3.7  CL 105  CO2 22  GLUCOSE 93  BUN 27*  CREATININE 2.51*  CALCIUM 9.3   Liver Function Tests:  Recent Labs Lab 11/03/12 0324  AST 126*  ALT 113*  ALKPHOS 97  BILITOT 0.5  PROT 7.1  ALBUMIN 3.1*    Recent Labs Lab 11/03/12 0324  LIPASE 68*   No results found for this basename: AMMONIA,  in the last 168 hours CBC:  Recent Labs Lab 11/03/12 0324  WBC 7.0  NEUTROABS 3.9  HGB 11.6*  HCT 32.5*  MCV 94.8  PLT 118*   Cardiac Enzymes: No results found for this basename: CKTOTAL, CKMB, CKMBINDEX, TROPONINI,  in the last 168 hours BNP: No components found with this basename: POCBNP,  CBG: No results found for this basename: GLUCAP,  in the last 168 hours  Radiological Exams on Admission: Dg Chest 2 View 11/03/2012   *  IMPRESSION: No acute cardiopulmonary process seen.   Original Report Authenticated By: Santa Lighter, M.D.    EKG: Normal sinus rhythm, no ST/T wave changes  Code Status: Full Family Communication: Pt at bedside Disposition Plan: Admit for further evaluation  Karen Lenz, MD  [facility] Pager [phone]  If 7PM-7AM, please contact night-coverage [email or web address] Password [email or web address] 11/03/2012, 6:05 AM

## 2012-11-03 NOTE — Progress Notes (Signed)
UR Completed.  

## 2012-11-03 NOTE — ED Notes (Signed)
Per [facility] pt c/o cp, abd pain X 3 days. En route BP 230/130, 2 nitro given 167/132. Pt reported symptom relief w/ nitro. No diaphoresis, no sob, pain does not radiate. 324 aspirin given en route. Per pt she has had cp between her breasts and upper quadrant abd pain intermitten for 1 week. Pt states pain occasionally radiates around rt side to her back. Denies n/v/d/sob.

## 2012-11-03 NOTE — ED Provider Notes (Addendum)
History     CSN: [id number]  Arrival date & time 11/03/12  0223   First MD Initiated Contact with Patient 11/03/12 [phone]      Chief Complaint  Patient presents with  . Chest Pain  . Abdominal Pain    (Consider location/radiation/quality/duration/timing/severity/associated sxs/prior treatment) The history is provided by the patient.  CLARINA MARCONE is a 52 y.o. female hx of asthma here with abdominal pain and chest pain. Left-sided abdominal pain for the last week or so that is intermittent. It occasionally radiates to her chest. Today she said that her abdominal pain and chest pain got worse. Denies any nausea or vomiting. He denies any dysuria but has some frequency. She has not seen a doctor for years. As per EMS she was hypertensive with BP 230/130. She was given nitro and ASA by EMS and chest pain improved.   Past Medical History  Diagnosis Date  . Asthma     Past Surgical History  Procedure Laterality Date  . Abdominal hysterectomy      No family history on file.  History  Substance Use Topics  . Smoking status: Current Every Day Smoker  . Smokeless tobacco: Not on file  . Alcohol Use: Yes    OB History   Grav Para Term Preterm Abortions TAB SAB Ect Mult Living                  Review of Systems  Cardiovascular: Positive for chest pain.  Gastrointestinal: Positive for abdominal pain.  All other systems reviewed and are negative.    Allergies  Review of patient's allergies indicates no known allergies.  Home Medications   Current Outpatient Rx  Name  Route  Sig  Dispense  Refill  . diphenhydramine-acetaminophen (TYLENOL PM EXTRA STRENGTH) 25-500 MG TABS   Oral   Take 2 tablets by mouth at bedtime as needed.         Marland Kitchen ibuprofen (ADVIL,MOTRIN) 200 MG tablet   Oral   Take 400 mg by mouth once.         . Ibuprofen-Diphenhydramine Cit (ADVIL PM) 200-38 MG TABS   Oral   Take 2 tablets by mouth at bedtime as needed (for sleep).           BP  148/104  Pulse 82  Temp(Src) 97.9 F (36.6 C) (Oral)  Resp 22  SpO2 100%  Physical Exam  Nursing note and vitals reviewed. Constitutional: She is oriented to person, place, and time. She appears well-developed and well-nourished.  HENT:  Head: Normocephalic.  Mouth/Throat: Oropharynx is clear and moist.  Eyes: Conjunctivae are normal. Pupils are equal, round, and reactive to light.  Neck: Normal range of motion. Neck supple.  Cardiovascular: Normal rate, regular rhythm and normal heart sounds.   Pulmonary/Chest: Effort normal and breath sounds normal. No respiratory distress. She has no wheezes. She has no rales.  Abdominal: Soft.  + mild LLQ tenderness   Musculoskeletal: Normal range of motion.  Neurological: She is alert and oriented to person, place, and time.  Skin: Skin is warm and dry.  Psychiatric: She has a normal mood and affect. Her behavior is normal. Judgment and thought content normal.    ED Course  Procedures (including critical care time)  Labs Reviewed  CBC WITH DIFFERENTIAL - Abnormal; Notable for the following:    RBC 3.43 (*)    Hemoglobin 11.6 (*)    HCT 32.5 (*)    Platelets 118 (*)    All other  components within normal limits  COMPREHENSIVE METABOLIC PANEL - Abnormal; Notable for the following:    BUN 27 (*)    Creatinine, Ser 2.51 (*)    Albumin 3.1 (*)    AST 126 (*)    ALT 113 (*)    GFR calc non Af Amer 21 (*)    GFR calc Af Amer 24 (*)    All other components within normal limits  LIPASE, BLOOD - Abnormal; Notable for the following:    Lipase 68 (*)    All other components within normal limits  URINALYSIS, ROUTINE W REFLEX MICROSCOPIC - Abnormal; Notable for the following:    APPearance CLOUDY (*)    Hgb urine dipstick TRACE (*)    Protein, ur 100 (*)    Leukocytes, UA TRACE (*)    All other components within normal limits  URINE MICROSCOPIC-ADD ON - Abnormal; Notable for the following:    Bacteria, UA MANY (*)    All other components  within normal limits  URINE CULTURE  POCT I-STAT TROPONIN I   Dg Chest 2 View  11/03/2012   *RADIOLOGY REPORT*  Clinical Data: Chest pain and epigastric abdominal pain.  History of asthma and smoking.  CHEST - 2 VIEW  Comparison: None.  Findings: The lungs are well-aerated and clear.  There is no evidence of focal opacification, pleural effusion or pneumothorax.  The heart is normal in size; the mediastinal contour is within normal limits.  No acute osseous abnormalities are seen.  IMPRESSION: No acute cardiopulmonary process seen.   Original Report Authenticated By: Santa Lighter, M.D.     No diagnosis found.   Date: 11/03/2012  Rate: 79  Rhythm: normal sinus rhythm  QRS Axis: normal  Intervals: normal  ST/T Wave abnormalities: J point elevation V4  Conduction Disutrbances:none  Narrative Interpretation:   Old EKG Reviewed: none available    MDM  DALASIA JARVI is a 52 y.o. female here with abdominal pain, chest pain. EKG showed J point, trop neg x 1. UA + UTI and she has acute renal failure and elevated LFTs and lipase. CT ordered. Her BP improved with pain meds and PO metoprolol. I think she may have pyelonephritis causing her acute renal failure. Will need admission to monitor kidney function.          Wandra Arthurs, MD 11/03/12 [id number]  Wandra Arthurs, MD 11/03/12 [phone]

## 2012-11-03 NOTE — ED Notes (Signed)
Patient transported to X-ray 

## 2012-11-03 NOTE — Progress Notes (Signed)
Pt arrived to unit from ED, alert and oriented, B/P elevated, Md aware, new orders given throughout the day for elevated blood pressure and blood pressure improved. Patient NPO at the beginning of admission, but throughout the day progressed from clear liquids to fat free diet due to abdominal pain and tolerated it well. Pain medicine given once today for abdominal pain, no further complaints afterwards. Walked with physical therapy and did very well per Physical therapy. Will continue to monitor.

## 2012-11-04 DIAGNOSIS — K921 Melena: Secondary | ICD-10-CM

## 2012-11-04 DIAGNOSIS — D649 Anemia, unspecified: Secondary | ICD-10-CM

## 2012-11-04 DIAGNOSIS — R894 Abnormal immunological findings in specimens from other organs, systems and tissues: Secondary | ICD-10-CM

## 2012-11-04 DIAGNOSIS — K7460 Unspecified cirrhosis of liver: Secondary | ICD-10-CM

## 2012-11-04 DIAGNOSIS — K922 Gastrointestinal hemorrhage, unspecified: Secondary | ICD-10-CM

## 2012-11-04 LAB — COMPREHENSIVE METABOLIC PANEL
ALT: 106 U/L — ABNORMAL HIGH (ref 0–35)
AST: 111 U/L — ABNORMAL HIGH (ref 0–37)
Albumin: 2.7 g/dL — ABNORMAL LOW (ref 3.5–5.2)
Alkaline Phosphatase: 90 U/L (ref 39–117)
BUN: 24 mg/dL — ABNORMAL HIGH (ref 6–23)
CO2: 22 mEq/L (ref 19–32)
Calcium: 8.9 mg/dL (ref 8.4–10.5)
Chloride: 108 mEq/L (ref 96–112)
Creatinine, Ser: 2.69 mg/dL — ABNORMAL HIGH (ref 0.50–1.10)
GFR calc Af Amer: 22 mL/min — ABNORMAL LOW (ref >90)
GFR calc non Af Amer: 19 mL/min — ABNORMAL LOW (ref >90)
Glucose, Bld: 87 mg/dL (ref 70–99)
Potassium: 3.7 mEq/L (ref 3.5–5.1)
Sodium: 139 mEq/L (ref 135–145)
Total Bilirubin: 0.3 mg/dL (ref 0.3–1.2)
Total Protein: 6.2 g/dL (ref 6.0–8.3)

## 2012-11-04 LAB — CBC
HCT: 30.1 % — ABNORMAL LOW (ref 36.0–46.0)
HCT: 32.9 % — ABNORMAL LOW (ref 36.0–46.0)
Hemoglobin: 10.7 g/dL — ABNORMAL LOW (ref 12.0–15.0)
Hemoglobin: 11.6 g/dL — ABNORMAL LOW (ref 12.0–15.0)
MCH: 33.5 pg (ref 26.0–34.0)
MCH: 33.7 pg (ref 26.0–34.0)
MCHC: 35.5 g/dL (ref 30.0–36.0)
MCHC: 35.3 g/dL (ref 30.0–36.0)
MCV: 94.4 fL (ref 78.0–100.0)
MCV: 95.6 fL (ref 78.0–100.0)
Platelets: 118 10*3/uL — ABNORMAL LOW (ref 150–400)
Platelets: 118 10*3/uL — ABNORMAL LOW (ref 150–400)
RBC: 3.19 MIL/uL — ABNORMAL LOW (ref 3.87–5.11)
RBC: 3.44 MIL/uL — ABNORMAL LOW (ref 3.87–5.11)
RDW: 13.1 % (ref 11.5–15.5)
RDW: 13.4 % (ref 11.5–15.5)
WBC: 6.6 10*3/uL (ref 4.0–10.5)
WBC: 6.8 10*3/uL (ref 4.0–10.5)

## 2012-11-04 LAB — URINE CULTURE: Colony Count: >=100000

## 2012-11-04 LAB — GLUCOSE, CAPILLARY: Glucose-Capillary: 79 mg/dL (ref 70–99)

## 2012-11-04 LAB — OCCULT BLOOD X 1 CARD TO LAB, STOOL: Fecal Occult Bld: POSITIVE — AB

## 2012-11-04 MED ORDER — NICOTINE POLACRILEX 2 MG MT GUM
2.0000 mg | CHEWING_GUM | OROMUCOSAL | Status: DC | PRN
  Filled 2012-11-04: qty 1

## 2012-11-04 MED ORDER — SODIUM CHLORIDE 0.9 % IV SOLN
INTRAVENOUS | Status: DC
  Administered 2012-11-04: 19:00:00 via INTRAVENOUS

## 2012-11-04 MED ORDER — LORAZEPAM 0.5 MG PO TABS
1.0000 mg | ORAL_TABLET | Freq: Four times a day (QID) | ORAL | Status: DC | PRN
  Administered 2012-11-04 – 2012-11-05 (×2): 1 mg via ORAL
  Filled 2012-11-04 (×2): qty 2

## 2012-11-04 MED ORDER — AMLODIPINE BESYLATE 5 MG PO TABS
5.0000 mg | ORAL_TABLET | Freq: Every day | ORAL | Status: DC
  Administered 2012-11-04: 5 mg via ORAL
  Filled 2012-11-04 (×2): qty 1

## 2012-11-04 NOTE — Progress Notes (Addendum)
[facility] PROGRESS NOTE  Karen Morrow [id number] DOB: 03-May-1961 DOA: 11/03/2012 PCP: No primary provider on file.  Assessment/Plan  Abdominal pain, probably due to gastritis or peptic ulcer disease although given the suggestion of cirrhosis on her abdominal ultrasound, she may have some portal hypertensive gastropathy.  Fullness of the head of the pancreas may be suggestive of pancreatitis however her lipase was not very elevated. There was no discrete mass demonstrated on CT or on ultrasound.  Patient endorses worsening melena this morning. -  Continue PPI and Carafate -  Clear liquid diet -  antiemetics -  analgesia with morphine 4 mg Q 3 hours IV PRN  -  Hemoccult stool:  Positive -  Repeat CBC:  hgb stable -   will repeat CBC tomorrow morning and if hgb stable and melena improving, patient may be discharged  -  Will need close GI followup -  I will touch base with the on-call gastroenterologists to make sure that it she can get a close follow up appointment  Chest pain, likely referred pain from abdominal pain; rule out ACS  - troponin neg x 1 - EKG pending - morphine for pain control  - nitroglycerin for pain control   Elevated transaminases trending down. May have been due to severe dehydration but patient may also have chronic hepatitis C. -  Ethanol level negative  -  Liver US demonstrated heterogeneous hepatic parenchyma and a slightly lobular contour suggestive of cirrhosis. -  Hepatitis C antibody test from 2011 was positive -  Qualitative PCR for Hep C RNA -  HIV nonreactive  E. Coli UTI  - continue rocephin 1 gm daily IV   Accelerated hypertension, with labile blood pressures ranging from 0000000 systolic -  Change metoprolol to labetalol and add when necessary hydralazine -  Control pain  Anemia, hemoglobin 11.6  - continue to monitor CBC   Acute renal failure, likely due to dehydration and improving with IV fluids.  Patient may also have some  underlying chronic kidney disease as her last creatinine was done in 2011 and despite elevated creatinine she does not have any evidence of acidosis or hyperkalemia. - continue IV fluids -  Urine protein to creatinine ratio due to elevated protein on UA  Thrombocytopenia, likely due to acute illness. Resolving - Repeat in the morning  Left renal cyst, stable from prior Right-sided vaginal prolapse, stable from prior  Diet:  Clear liquid Access:  PIV IVF:  Normal saline at 75 mL per hour Proph:  Heparin  Code Status: Full code Family Communication: Spoke with patient alone Disposition Plan: Pending ensuring that melena is improving and hemoglobin is stable, possibly tomorrow   Consultants:  None  Procedures:  CT abdomen and pelvis  Ultrasound abdomen complete  Chest x-ray  Antibiotics:  None   HPI/Subjective:  The patient states that her periumbilical pain resolved but is now slowly returning.n  she is otherwise feeling well and would like to eat.  When asked if she had ever been diagnosed with hepatitis she said no. She agrees to testing for HIV. She states she drinks 2 or 3 beers occasionally on the weekends.  Objective: Filed Vitals:   11/03/12 1917 11/03/12 2142 11/04/12 0636 11/04/12 0639  BP: 134/89 136/88 155/115 135/103  Pulse: 70 75 73   Temp:  97.8 F (36.6 C) 97.2 F (36.2 C)   TempSrc:  Oral Oral   Resp: 18 18 20    Height:      Weight:  61.6 kg (135 lb 12.9 oz)   SpO2: 100% 100% 100% 99%    Intake/Output Summary (Last 24 hours) at 11/04/12 0951 Last data filed at 11/03/12 2013  Gross per 24 hour  Intake 1527.5 ml  Output    720 ml  Net  807.5 ml   Filed Weights   11/03/12 1000 11/04/12 0636  Weight: 61.8 kg (136 lb 3.9 oz) 61.6 kg (135 lb 12.9 oz)    Exam:   General:  African American female,  No acute distress  HEENT:  NCAT, MMM  Cardiovascular:  RRR, nl S1, S2 no mrg, 2+ pulses, warm extremities  Respiratory:  CTAB, no  increased WOB  Abdomen:  NABS, soft, NT/ND  MSK:   Normal tone and bulk, no LEE  Neuro:  Grossly intact  Data Reviewed: Basic Metabolic Panel:  Recent Labs Lab 11/03/12 0324 11/03/12 1028 11/04/12 0420  NA 136 138 139  K 3.7 3.7 3.7  CL 105 106 108  CO2 22 23 22   GLUCOSE 93 84 87  BUN 27* 24* 24*  CREATININE 2.51* 2.29* 2.69*  CALCIUM 9.3 9.0 8.9  MG  --  1.8  --   PHOS  --  3.3  --    Liver Function Tests:  Recent Labs Lab 11/03/12 0324 11/03/12 1028 11/04/12 0420  AST 126* 109* 111*  ALT 113* 108* 106*  ALKPHOS 97 97 90  BILITOT 0.5 0.4 0.3  PROT 7.1 6.9 6.2  ALBUMIN 3.1* 3.0* 2.7*    Recent Labs Lab 11/03/12 0324  LIPASE 68*   No results found for this basename: AMMONIA,  in the last 168 hours CBC:  Recent Labs Lab 11/03/12 0324 11/03/12 1028 11/04/12 0420  WBC 7.0 6.3 6.6  NEUTROABS 3.9 3.7  --   HGB 11.6* 11.7* 10.7*  HCT 32.5* 33.4* 30.1*  MCV 94.8 95.4 94.4  PLT 118* 121* 118*   Cardiac Enzymes:  Recent Labs Lab 11/03/12 1028 11/03/12 1509 11/03/12 2203  TROPONINI <0.30 <0.30 <0.30   BNP (last 3 results) No results found for this basename: PROBNP,  in the last 8760 hours CBG: No results found for this basename: GLUCAP,  in the last 168 hours  Recent Results (from the past 240 hour(s))  URINE CULTURE     Status: None   Collection Time    11/03/12  3:25 AM      Result Value Range Status   Specimen Description URINE, CLEAN CATCH   Final   Special Requests CX ADDED AT 0411 ON [id number]   Final   Culture  Setup Time 11/03/2012 03:25   Final   Colony Count >=100,000 COLONIES/ML   Final   Culture ESCHERICHIA COLI   Final   Report Status PENDING   Incomplete     Studies: Ct Abdomen Pelvis Wo Contrast  11/03/2012   *RADIOLOGY REPORT*  Clinical Data: Left lower quadrant abdominal pain.  CT ABDOMEN AND PELVIS WITHOUT CONTRAST  Technique:  Multidetector CT imaging of the abdomen and pelvis was performed following the standard protocol  without intravenous contrast.  Comparison: 11/13/2009.  Findings: The lung bases are clear except for dependent left basilar atelectasis.  The liver is unremarkable.  No focal hepatic lesions or intrahepatic ductal dilatation.  The spleen is normal.  The pancreas is unremarkable.  No biliary dilatation.  The gallbladder is normal. The adrenal glands and kidneys are unremarkable except for a hyperdense/hemorrhagic left renal cyst  The stomach demonstrates antral pyloric wall thickening.  Cannot exclude gastritis  or peptic ulcer disease.  The duodenum, small bowel and colon are unremarkable.  No inflammatory changes or mass lesions.  The appendix is normal.  No mesenteric or retroperitoneal mass or adenopathy.  Advanced aortic calcifications are stable.  The bladder is unremarkable.  The uterus is surgically absent. There appears to be  vaginal prolapse on the right versus a large Bartholin gland cyst, unchanged since 2011.  Moderate swelling and edema involving the labia are noted.  The bony structures are unremarkable.  Advanced progressive degenerative disc disease is noted at L5-S1 with discogenic sclerosis.  IMPRESSION:  1. Antropyloric wall thickening of the stomach could reflect changes of gastritis or peptic ulcer disease. 2.  Stable hyperdense/hemorrhagic left renal cyst. 3. Stable right-sided vaginal prolapse versus large Bartholin gland cyst. 4.  Marked soft tissue swelling/edema involving the labia. 5.  Significant and progressive degenerative disc disease at L5-S1.   Original Report Authenticated By: Marijo Sanes, M.D.   Dg Chest 2 View  11/03/2012   *RADIOLOGY REPORT*  Clinical Data: Chest pain and epigastric abdominal pain.  History of asthma and smoking.  CHEST - 2 VIEW  Comparison: None.  Findings: The lungs are well-aerated and clear.  There is no evidence of focal opacification, pleural effusion or pneumothorax.  The heart is normal in size; the mediastinal contour is within normal limits.  No  acute osseous abnormalities are seen.  IMPRESSION: No acute cardiopulmonary process seen.   Original Report Authenticated By: Santa Lighter, M.D.   US Abdomen Complete  11/03/2012   *RADIOLOGY REPORT*  Clinical Data:  History given of abnormal liver function tests. History of abdominal pain.  History of elevated creatinine level of 2.5.  ABDOMINAL ULTRASOUND COMPLETE  Comparison:  CT 11/13/2009.  Findings:  Gallbladder: No shadowing gallstones or echogenic sludge. No gallbladder wall thickening or pericholecystic fluid. The gallbladder wall thickness measured 1.9  mm. No sonographic Murphy's sign according to the ultrasound technologist. There is a 4 mm gallbladder polyp.  CBD: Normal in caliber measuring 2.4 mm. No choledocholithiasis is evident.  Liver:  Normal size cysts with mildly heterogeneous parenchymal echotexture without evidence of hepatic mass.  There is slight surface lobular contour on some images. Portal vein is patent with hepatopetal flow.  Hepatic veins appear patent.  IVC:  Patent throughout its visualized course in the abdomen.  Pancreas:  Although the pancreas is difficult to visualize in its entirety, no focal pancreatic abnormality is identified.  There is slight fullness of the head of the pancreas without discrete mass by ultrasound.  Spleen:  Normal size and echotexture without focal abnormality. Length is 7 cm.  Right kidney:  No hydronephrosis.  Well-preserved cortex.  Slightly increased echogenicity of the renal parenchymal echotexture without focal abnormalities.  Right renal length is 10.2 cm.  Left kidney:  No hydronephrosis.  Well-preserved cortex. Increased echogenicity of the renal parenchymal echotexture. There is a hypoechoic area consistent with a small cyst which measures 1.8 x 1.4 x 1.4 cm.  It appears simple.  Left renal length is 9.6 cm.  Aorta:  Maximum diameter is 2.5 cm.  No aneurysm is evident.  Ascites:  None.  IMPRESSION: No cholelithiasis or evidence of  cholecystitis is seen. Bile ducts appeared normal.  There is a small gallbladder polyp.  Hepatic parenchyma is somewhat heterogeneous.  No hepatic mass is evident.  There is a slightly lobular contour of the surface of the liver on some images.  This can be associated with cirrhosis.  Fullness of the  head of the pancreas without discrete mass identified.  Increased echogenicity of the parenchyma of both kidneys.  This may be associated with chronic medical renal disease.  Left renal cyst.   Original Report Authenticated By: Shanon Brow Call    Scheduled Meds: . amLODipine  5 mg Oral Daily  . cefTRIAXone (ROCEPHIN)  IV  1 g Intravenous Q24H  . labetalol  200 mg Oral BID  . pantoprazole (PROTONIX) IV  40 mg Intravenous Q12H  . sodium chloride  3 mL Intravenous Q12H  . sucralfate  1 g Oral TID WC & HS   Continuous Infusions: . sodium chloride      Principal Problem:   Abdominal pain Active Problems:   Chest pain   Acute pancreatitis   Accelerated hypertension   Anemia   Acute renal failure   Thrombocytopenia   UTI (lower urinary tract infection)   Elevated transaminase level   Hepatitis C antibody test positive   Cirrhosis of liver without mention of alcohol, suggested by abdominal US    Time spent: 30 min    Sameria Morss, [facility] Pager [phone]. If 7PM-7AM, please contact night-coverage at [email or web address], password [facility] 11/04/2012, 9:51 AM  LOS: 1 day

## 2012-11-04 NOTE — Progress Notes (Signed)
Pt in bed resting comfortably watching TV, assessment done, no c/o pain at this time.

## 2012-11-04 NOTE — Progress Notes (Signed)
Pt resting quietly in bed no c/o noted .

## 2012-11-05 DIAGNOSIS — R7401 Elevation of levels of liver transaminase levels: Secondary | ICD-10-CM

## 2012-11-05 DIAGNOSIS — K921 Melena: Secondary | ICD-10-CM

## 2012-11-05 DIAGNOSIS — R7402 Elevation of levels of lactic acid dehydrogenase (LDH): Secondary | ICD-10-CM

## 2012-11-05 LAB — CBC
HCT: 29.8 % — ABNORMAL LOW (ref 36.0–46.0)
Hemoglobin: 10.9 g/dL — ABNORMAL LOW (ref 12.0–15.0)
MCH: 34.3 pg — ABNORMAL HIGH (ref 26.0–34.0)
MCHC: 36.6 g/dL — ABNORMAL HIGH (ref 30.0–36.0)
MCV: 93.7 fL (ref 78.0–100.0)
Platelets: 112 10*3/uL — ABNORMAL LOW (ref 150–400)
RBC: 3.18 MIL/uL — ABNORMAL LOW (ref 3.87–5.11)
RDW: 13 % (ref 11.5–15.5)
WBC: 6 10*3/uL (ref 4.0–10.5)

## 2012-11-05 MED ORDER — SUCRALFATE 1 G PO TABS: 1.0000 g | ORAL_TABLET | Freq: Four times a day (QID) | ORAL | Status: DC

## 2012-11-05 MED ORDER — PANTOPRAZOLE SODIUM 40 MG PO TBEC: 40.0000 mg | DELAYED_RELEASE_TABLET | Freq: Two times a day (BID) | ORAL | Status: DC

## 2012-11-05 MED ORDER — LABETALOL HCL 200 MG PO TABS: 200.0000 mg | ORAL_TABLET | Freq: Two times a day (BID) | ORAL | Status: DC

## 2012-11-06 LAB — GLUCOSE, CAPILLARY: Glucose-Capillary: 98 mg/dL (ref 70–99)

## 2012-11-06 LAB — HEPATITIS C VRS RNA DETECT BY PCR-QUAL: Hepatitis C Vrs RNA by PCR-Qual: POSITIVE — AB

## 2012-11-07 ENCOUNTER — Telehealth: Payer: Self-pay | Admitting: Internal Medicine

## 2012-11-07 NOTE — Telephone Encounter (Signed)
Patient has a positive viral load for hepatitis C so has chronic ongoing infection which likely explains her elevated LFTs.  Left message for her to call back. She was supposed to followup with gastroenterology and I want to verify the she has scheduled that appointment.

## 2012-11-15 ENCOUNTER — Telehealth: Payer: Self-pay | Admitting: Internal Medicine

## 2012-11-15 NOTE — Telephone Encounter (Signed)
Left message for patient to call back to the office;  

## 2013-01-25 ENCOUNTER — Encounter (HOSPITAL_COMMUNITY): Payer: Self-pay | Admitting: *Deleted

## 2013-01-25 ENCOUNTER — Emergency Department (HOSPITAL_COMMUNITY)
Admission: EM | Admit: 2013-01-25 | Discharge: 2013-01-25 | Disposition: A | Discharge status: Home / Self Care | Payer: Self-pay | Source: Home / Self Care

## 2013-01-25 ENCOUNTER — Emergency Department (INDEPENDENT_AMBULATORY_CARE_PROVIDER_SITE_OTHER): Payer: Self-pay

## 2013-01-25 DIAGNOSIS — M549 Dorsalgia, unspecified: Secondary | ICD-10-CM

## 2013-01-25 MED ORDER — TRAMADOL HCL 50 MG PO TABS: 50.0000 mg | ORAL_TABLET | Freq: Three times a day (TID) | ORAL | Status: DC | PRN

## 2013-01-25 NOTE — ED Notes (Signed)
Pt c/o back pain in the middle of her back x 3 weeks. She states she took OTC meds, ibuprofen and goody powder, with no relief. Jan Ranson, SMA

## 2013-01-25 NOTE — ED Notes (Signed)
Pt needs blood pressure med refilled. todays BP 168/102

## 2013-01-25 NOTE — ED Provider Notes (Signed)
Karen Morrow is a 52 y.o. female who presents to Urgent Care today for back pain for the last 3 weeks. 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 She notes considerable lumbar pain. The pain is full bilaterally and does not radiate. The pain is worse with standing and back extension. The pain is better with rest. She denies any radiating pain weakness or numbness difficulty walking or bowel or bladder dysfunction. She's tried multiple over-the-counter pain medications which have not been effective. No nausea vomiting diarrhea fevers or chill.    PMH reviewed. Hypertension and hepatitis C, history of upper GI bleed History  Substance Use Topics  . Smoking status: Current Every Day Smoker -- 0.50 packs/day for 20 years    Types: Cigarettes  . Smokeless tobacco: Never Used  . Alcohol Use: 27.6 oz/week    46 Cans of beer per week     Comment: 11/03/2012 "1-2 40oz beers/day maybe"   ROS as above Medications reviewed. No current facility-administered medications for this encounter.   Current Outpatient Prescriptions  Medication Sig Dispense Refill  . diphenhydramine-acetaminophen (TYLENOL PM EXTRA STRENGTH) 25-500 MG TABS Take 2 tablets by mouth at bedtime as needed.      . labetalol (NORMODYNE) 200 MG tablet Take 1 tablet (200 mg total) by mouth 2 (two) times daily.  60 tablet  0  . pantoprazole (PROTONIX) 40 MG tablet Take 1 tablet (40 mg total) by mouth 2 (two) times daily.  60 tablet  0  . sucralfate (CARAFATE) 1 G tablet Take 1 tablet (1 g total) by mouth 4 (four) times daily.  60 tablet  0  . traMADol (ULTRAM) 50 MG tablet Take 1 tablet (50 mg total) by mouth every 8 (eight) hours as needed for pain.  30 tablet  0    Exam:  BP 168/102  Temp(Src) 98.1 F (36.7 C) (Oral)  Resp 16  SpO2 100% Gen: Well NAD HEENT: EOMI,  MMM Lungs: CTABL Nl WOB Heart: RRR no MRG Abd: NABS, NT, ND Exts: Non edematous BL   LE, warm and well perfused.  MSK: Nontender to spinal midline. Tender bilateral lumbar paraspinal muscles  Normal flexion and extension however some pain with extension. Normal rotation range of motion. Negative straight leg raising Faber test.    No results found for this or any previous visit (from the past 24 hour(s)). Dg Lumbar Spine Complete  01/25/2013   *RADIOLOGY REPORT*  Clinical Data: Back pain  LUMBAR SPINE - COMPLETE 4+ VIEW  Comparison: 11/03/2012  Findings: Five lumbar type vertebral bodies are well visualized. Vertebral body height.  Mild facet hypertrophic changes are noted. Disc space narrowing is noted of a chronic nature of L5 S1. Vascular calcifications are noted within the aorta.  No aneurysmal dilatation is seen.  IMPRESSION: Degenerative change without acute abnormality.   Original Report Authenticated By: Inez Catalina, M.D.    Assessment and Plan: 52 y.o. female with lumbago likely due to combination of degenerative changes in lumbar spine associated with a demanding job.  Plan to treat pain temporarily with rest from work for 2 or 3 days followed by tramadol when not at work and Tylenol for pain. Avoid NSAIDs and aspirin due to history of upper GI bleed. Attempt to find a new job soon as possible Discussed warning signs or symptoms. Please see discharge instructions. Patient expresses understanding.      Gregor Hams, MD 01/25/13  1504 

## 2013-06-05 ENCOUNTER — Inpatient Hospital Stay (HOSPITAL_COMMUNITY)
Admission: EM | Admit: 2013-06-05 | Discharge: 2013-06-07 | DRG: 378 | Disposition: A | Discharge status: Home / Self Care | Payer: Self-pay | Attending: Family Medicine | Admitting: Family Medicine

## 2013-06-05 ENCOUNTER — Encounter (HOSPITAL_COMMUNITY)
Admission: EM | Disposition: A | Discharge status: Home / Self Care | Payer: Self-pay | Source: Home / Self Care | Attending: Family Medicine

## 2013-06-05 ENCOUNTER — Encounter (HOSPITAL_COMMUNITY): Payer: Self-pay | Admitting: Emergency Medicine

## 2013-06-05 ENCOUNTER — Ambulatory Visit (HOSPITAL_COMMUNITY): Admit: 2013-06-05 | Payer: Self-pay | Admitting: Gastroenterology

## 2013-06-05 DIAGNOSIS — K254 Chronic or unspecified gastric ulcer with hemorrhage: Principal | ICD-10-CM | POA: Diagnosis present

## 2013-06-05 DIAGNOSIS — K259 Gastric ulcer, unspecified as acute or chronic, without hemorrhage or perforation: Secondary | ICD-10-CM

## 2013-06-05 DIAGNOSIS — T3995XA Adverse effect of unspecified nonopioid analgesic, antipyretic and antirheumatic, initial encounter: Secondary | ICD-10-CM | POA: Diagnosis present

## 2013-06-05 DIAGNOSIS — B1920 Unspecified viral hepatitis C without hepatic coma: Secondary | ICD-10-CM | POA: Diagnosis present

## 2013-06-05 DIAGNOSIS — R894 Abnormal immunological findings in specimens from other organs, systems and tissues: Secondary | ICD-10-CM

## 2013-06-05 DIAGNOSIS — K5710 Diverticulosis of small intestine without perforation or abscess without bleeding: Secondary | ICD-10-CM | POA: Diagnosis present

## 2013-06-05 DIAGNOSIS — R740 Nonspecific elevation of levels of transaminase and lactic acid dehydrogenase [LDH]: Secondary | ICD-10-CM

## 2013-06-05 DIAGNOSIS — K922 Gastrointestinal hemorrhage, unspecified: Secondary | ICD-10-CM | POA: Diagnosis present

## 2013-06-05 DIAGNOSIS — F1210 Cannabis abuse, uncomplicated: Secondary | ICD-10-CM | POA: Diagnosis present

## 2013-06-05 DIAGNOSIS — R079 Chest pain, unspecified: Secondary | ICD-10-CM

## 2013-06-05 DIAGNOSIS — N390 Urinary tract infection, site not specified: Secondary | ICD-10-CM

## 2013-06-05 DIAGNOSIS — I10 Essential (primary) hypertension: Secondary | ICD-10-CM

## 2013-06-05 DIAGNOSIS — J45909 Unspecified asthma, uncomplicated: Secondary | ICD-10-CM | POA: Diagnosis present

## 2013-06-05 DIAGNOSIS — R7401 Elevation of levels of liver transaminase levels: Secondary | ICD-10-CM

## 2013-06-05 DIAGNOSIS — N179 Acute kidney failure, unspecified: Secondary | ICD-10-CM

## 2013-06-05 DIAGNOSIS — R768 Other specified abnormal immunological findings in serum: Secondary | ICD-10-CM

## 2013-06-05 DIAGNOSIS — D649 Anemia, unspecified: Secondary | ICD-10-CM

## 2013-06-05 DIAGNOSIS — D696 Thrombocytopenia, unspecified: Secondary | ICD-10-CM

## 2013-06-05 DIAGNOSIS — F17200 Nicotine dependence, unspecified, uncomplicated: Secondary | ICD-10-CM | POA: Diagnosis present

## 2013-06-05 DIAGNOSIS — K920 Hematemesis: Secondary | ICD-10-CM

## 2013-06-05 DIAGNOSIS — R000 Tachycardia, unspecified: Secondary | ICD-10-CM | POA: Diagnosis present

## 2013-06-05 DIAGNOSIS — K7460 Unspecified cirrhosis of liver: Secondary | ICD-10-CM

## 2013-06-05 DIAGNOSIS — R109 Unspecified abdominal pain: Secondary | ICD-10-CM

## 2013-06-05 DIAGNOSIS — Z791 Long term (current) use of non-steroidal anti-inflammatories (NSAID): Secondary | ICD-10-CM

## 2013-06-05 DIAGNOSIS — K921 Melena: Secondary | ICD-10-CM

## 2013-06-05 HISTORY — PX: ESOPHAGOGASTRODUODENOSCOPY: SHX5428

## 2013-06-05 LAB — CBC WITH DIFFERENTIAL/PLATELET
Basophils Absolute: 0 10*3/uL (ref 0.0–0.1)
Basophils Relative: 0 % (ref 0–1)
Eosinophils Absolute: 0.2 10*3/uL (ref 0.0–0.7)
Eosinophils Relative: 2 % (ref 0–5)
HCT: 29.7 % — ABNORMAL LOW (ref 36.0–46.0)
Hemoglobin: 10.4 g/dL — ABNORMAL LOW (ref 12.0–15.0)
Lymphocytes Relative: 21 % (ref 12–46)
Lymphs Abs: 2.6 10*3/uL (ref 0.7–4.0)
MCH: 32.6 pg (ref 26.0–34.0)
MCHC: 35 g/dL (ref 30.0–36.0)
MCV: 93.1 fL (ref 78.0–100.0)
Monocytes Absolute: 0.7 10*3/uL (ref 0.1–1.0)
Monocytes Relative: 6 % (ref 3–12)
Neutro Abs: 8.8 10*3/uL — ABNORMAL HIGH (ref 1.7–7.7)
Neutrophils Relative %: 71 % (ref 43–77)
Platelets: 195 10*3/uL (ref 150–400)
RBC: 3.19 MIL/uL — ABNORMAL LOW (ref 3.87–5.11)
RDW: 13.5 % (ref 11.5–15.5)
WBC: 12.4 10*3/uL — ABNORMAL HIGH (ref 4.0–10.5)

## 2013-06-05 LAB — COMPREHENSIVE METABOLIC PANEL
ALT: 49 U/L — ABNORMAL HIGH (ref 0–35)
AST: 50 U/L — ABNORMAL HIGH (ref 0–37)
Albumin: 3.3 g/dL — ABNORMAL LOW (ref 3.5–5.2)
Alkaline Phosphatase: 73 U/L (ref 39–117)
BUN: 49 mg/dL — ABNORMAL HIGH (ref 6–23)
CO2: 22 mEq/L (ref 19–32)
Calcium: 9.1 mg/dL (ref 8.4–10.5)
Chloride: 103 mEq/L (ref 96–112)
Creatinine, Ser: 2.96 mg/dL — ABNORMAL HIGH (ref 0.50–1.10)
GFR calc Af Amer: 20 mL/min — ABNORMAL LOW (ref >90)
GFR calc non Af Amer: 17 mL/min — ABNORMAL LOW (ref >90)
Glucose, Bld: 107 mg/dL — ABNORMAL HIGH (ref 70–99)
Potassium: 4.9 mEq/L (ref 3.7–5.3)
Sodium: 138 mEq/L (ref 137–147)
Total Bilirubin: 0.5 mg/dL (ref 0.3–1.2)
Total Protein: 7 g/dL (ref 6.0–8.3)

## 2013-06-05 LAB — PROTIME-INR
INR: 1.02 (ref 0.00–1.49)
Prothrombin Time: 13.2 seconds (ref 11.6–15.2)

## 2013-06-05 LAB — APTT: aPTT: 29 seconds (ref 24–37)

## 2013-06-05 LAB — OCCULT BLOOD, POC DEVICE: Fecal Occult Bld: POSITIVE — AB

## 2013-06-05 LAB — ABO/RH: ABO/RH(D): O POS

## 2013-06-05 SURGERY — EGD (ESOPHAGOGASTRODUODENOSCOPY)
Anesthesia: Moderate Sedation

## 2013-06-05 MED ORDER — MIDAZOLAM HCL 10 MG/2ML IJ SOLN
INTRAMUSCULAR | Status: DC | PRN
  Administered 2013-06-05 (×3): 2 mg via INTRAVENOUS

## 2013-06-05 MED ORDER — ONDANSETRON HCL 4 MG/2ML IJ SOLN
4.0000 mg | Freq: Once | INTRAMUSCULAR | Status: AC
  Administered 2013-06-05: 4 mg via INTRAVENOUS
  Filled 2013-06-05: qty 2

## 2013-06-05 MED ORDER — ONDANSETRON HCL 4 MG PO TABS: 4.0000 mg | ORAL_TABLET | Freq: Four times a day (QID) | ORAL | Status: DC | PRN

## 2013-06-05 MED ORDER — ONDANSETRON HCL 4 MG/2ML IJ SOLN: 4.0000 mg | Freq: Three times a day (TID) | INTRAMUSCULAR | Status: DC | PRN

## 2013-06-05 MED ORDER — MORPHINE SULFATE 2 MG/ML IJ SOLN: 1.0000 mg | INTRAMUSCULAR | Status: DC | PRN

## 2013-06-05 MED ORDER — FENTANYL CITRATE 0.05 MG/ML IJ SOLN
INTRAMUSCULAR | Status: DC | PRN
  Administered 2013-06-05 (×3): 25 ug via INTRAVENOUS

## 2013-06-05 MED ORDER — SODIUM CHLORIDE 0.9 % IJ SOLN
3.0000 mL | Freq: Two times a day (BID) | INTRAMUSCULAR | Status: DC
  Administered 2013-06-06: 3 mL via INTRAVENOUS

## 2013-06-05 MED ORDER — SODIUM CHLORIDE 0.9 % IV SOLN
Freq: Once | INTRAVENOUS | Status: AC
  Administered 2013-06-05: 09:00:00 via INTRAVENOUS

## 2013-06-05 MED ORDER — DIPHENHYDRAMINE HCL 50 MG/ML IJ SOLN
INTRAMUSCULAR | Status: DC | PRN
  Administered 2013-06-05: 12.5 mg via INTRAVENOUS

## 2013-06-05 MED ORDER — SODIUM CHLORIDE 0.9 % IV SOLN: INTRAVENOUS | Status: DC

## 2013-06-05 MED ORDER — ONDANSETRON HCL 4 MG/2ML IJ SOLN: 4.0000 mg | Freq: Four times a day (QID) | INTRAMUSCULAR | Status: DC | PRN

## 2013-06-05 MED ORDER — MIDAZOLAM HCL 10 MG/2ML IJ SOLN
INTRAMUSCULAR | Status: AC
  Filled 2013-06-05: qty 2

## 2013-06-05 MED ORDER — SODIUM CHLORIDE 0.9 % IV BOLUS (SEPSIS)
1000.0000 mL | Freq: Once | INTRAVENOUS | Status: AC
  Administered 2013-06-05: 1000 mL via INTRAVENOUS

## 2013-06-05 MED ORDER — PANTOPRAZOLE SODIUM 40 MG IV SOLR
8.0000 mg/h | INTRAVENOUS | Status: DC
  Administered 2013-06-05 – 2013-06-07 (×5): 8 mg/h via INTRAVENOUS
  Filled 2013-06-05 (×11): qty 80

## 2013-06-05 MED ORDER — FENTANYL CITRATE 0.05 MG/ML IJ SOLN
INTRAMUSCULAR | Status: AC
  Filled 2013-06-05: qty 2

## 2013-06-05 MED ORDER — PANTOPRAZOLE SODIUM 40 MG IV SOLR
80.0000 mg | Freq: Once | INTRAVENOUS | Status: AC
  Administered 2013-06-05: 80 mg via INTRAVENOUS
  Filled 2013-06-05: qty 80

## 2013-06-05 MED ORDER — BUTAMBEN-TETRACAINE-BENZOCAINE 2-2-14 % EX AERO
INHALATION_SPRAY | CUTANEOUS | Status: DC | PRN
  Administered 2013-06-05: 2 via TOPICAL

## 2013-06-05 MED ORDER — DIPHENHYDRAMINE HCL 50 MG/ML IJ SOLN
INTRAMUSCULAR | Status: AC
  Filled 2013-06-05: qty 1

## 2013-06-05 MED ORDER — KCL IN DEXTROSE-NACL 20-5-0.45 MEQ/L-%-% IV SOLN
INTRAVENOUS | Status: DC
  Administered 2013-06-05 – 2013-06-06 (×2): 100 mL/h via INTRAVENOUS
  Administered 2013-06-06 (×2): via INTRAVENOUS
  Filled 2013-06-05 (×6): qty 1000

## 2013-06-05 NOTE — ED Notes (Signed)
Pt c/o vomiting dark blood with large clots x3 since 4a, normal BM this am that was dark in color, states hasn't taken b/p meds in over 29months

## 2013-06-05 NOTE — Op Note (Signed)
[facility] [place], [id number]   ENDOSCOPY PROCEDURE REPORT  PATIENT: Karen, Morrow  MR#: [id number] BIRTHDATE: 02/02/1961 , 52  yrs. old GENDER: Female ENDOSCOPIST:Prabhjot Maddux Oletta Lamas, MD REFERRED BY:  ER. No PCP PROCEDURE DATE:  06/05/2013 PROCEDURE:    EGD and biopsy ASA CLASS:   class 2 INDICATIONS:  hematemesis MEDICATION:  Benadryl 25 mg, versed 6 mg, and fentanyl 75 mcg IV TOPICAL ANESTHETIC:    cetacaine spray  DESCRIPTION OF PROCEDURE: The Pentax adult scope was attempted to be passed both blindly as well  as under direct visualization. The patient was quite agitated and we were unable to successfully pass the scope. We then passed the Pentax pediatric endoscope with swallowing easily. The esophagus was examined. There was no active bleeding in no signs of esophageal varices, ulceration, or Barrett's esophagus. The stomach was entered. There was no active bleeding scene of the stomach. In the antrum was marked inflammation in a large deep gastric ulcer that was not actively bleeding. Biopsies around the ulcer were taken. The pyloric channel appeared normal as did the duodenal bulb. The patient had a relatively large duodenal diverticula with the ampulla on the edge of the diverticula making somewhat difficult to pass into the 2nd duodenum. The 2nd duodenum was seen well and did not reveal any abnormalities. The scope was withdrawn in the initial findings were confirmed. The patient tolerated procedure well after the scope had been passed.     COMPLICATIONS: None  ENDOSCOPIC IMPRESSION: 1. Large Gastric Ulcer. Not actively bleeding probably due to NSAIDs. Biopsies were obtained to evaluate for malignancy and  for H pylori. 2. Duodenal Diverticula. Moderate size seen the beginning of the 2nd duodenum.  RECOMMENDATIONS: 1.we'll check results of biopsies. 2. We will need to follow this ulcer with repeat EGD or upper G.I. to make  sure that is healed 3. Would treat with PPI 4. We'll began liquid diet    _______________________________ eSignedLaurence Spates, MD 06/05/2013 1:27 PM    PATIENT NAME:  Karen, Morrow MR#: [id number]

## 2013-06-05 NOTE — Interval H&P Note (Signed)
History and Physical Interval Note:  06/05/2013 12:47 PM  Karen Morrow  has presented today for surgery, with the diagnosis of hematemesis  The various methods of treatment have been discussed with the patient and family. After consideration of risks, benefits and other options for treatment, the patient has consented to  Procedure(s): ESOPHAGOGASTRODUODENOSCOPY (EGD) (N/A) as a surgical intervention .  The patient's history has been reviewed, patient examined, no change in status, stable for surgery.  I have reviewed the patient's chart and labs.  Questions were answered to the patient's satisfaction.     Kati Riggenbach JR,Heinz Eckert L

## 2013-06-05 NOTE — Consult Note (Signed)
[facility] CON[facility] Reason for consult: Hematemesis Referring Physician: ER. No PCP. Patient is unassigned.  Karen Morrow is an 53 y.o. female.  HPI: she denies any history of prior G.I. bleeding. She denies history of liver disease and in spite of the fact that she carries a diagnosis of positive hepatitis C antibody denies ever having hepatitis C or knowledge of this positive antibody. She is not in any chronic medications. She does not see a doctor regularly. She has had previous partial hysterectomy and for the past 2 weeks has been having lower pelvic pain and was taking ibuprofen for this pain. Prior to this time, she denies taking any NSAIDs on a regular basis. She states that she took to ibuprofen at night to sleep because of this pain. The pain has improved. She woke up this morning with hematemesis of on a large quantity of clots and blood. She has had no indigestion heartburn or upper pain. She has had melenic stools for several days. Her hemoglobin in the ER is 10.4  Past Medical History  Diagnosis Date  . Asthma   . Hepatitis C antibody test positive   . Hypertension     "just dx'd today" (11/03/2012)  . Anginal pain     Past Surgical History  Procedure Laterality Date  . Abdominal hysterectomy      "partial" (11/03/2012)    No family history on file.  Social History:  reports that she has been smoking Cigarettes.  She has a 10 pack-year smoking history. She has never used smokeless tobacco. She reports that she drinks about 27.6 ounces of alcohol per week. She reports that she uses illicit drugs (Marijuana).  Allergies: No Known Allergies  Medications; Prior to Admission medications   Medication Sig Start Date End Date Taking? Authorizing Provider  Ibuprofen-Diphenhydramine HCl (ADVIL PM) 200-25 MG CAPS Take 2 capsules by mouth at bedtime as needed (sleep/pain).   Yes Historical Provider, MD     PRN Meds  Results for orders placed during the hospital  encounter of 06/05/13 (from the past 48 hour(s))  CBC WITH DIFFERENTIAL     Status: Abnormal   Collection Time    06/05/13  8:10 AM      Result Value Range   WBC 12.4 (*) 4.0 - 10.5 K/uL   RBC 3.19 (*) 3.87 - 5.11 MIL/uL   Hemoglobin 10.4 (*) 12.0 - 15.0 g/dL   HCT 29.7 (*) 36.0 - 46.0 %   MCV 93.1  78.0 - 100.0 fL   MCH 32.6  26.0 - 34.0 pg   MCHC 35.0  30.0 - 36.0 g/dL   RDW 13.5  11.5 - 15.5 %   Platelets 195  150 - 400 K/uL   Neutrophils Relative % 71  43 - 77 %   Neutro Abs 8.8 (*) 1.7 - 7.7 K/uL   Lymphocytes Relative 21  12 - 46 %   Lymphs Abs 2.6  0.7 - 4.0 K/uL   Monocytes Relative 6  3 - 12 %   Monocytes Absolute 0.7  0.1 - 1.0 K/uL   Eosinophils Relative 2  0 - 5 %   Eosinophils Absolute 0.2  0.0 - 0.7 K/uL   Basophils Relative 0  0 - 1 %   Basophils Absolute 0.0  0.0 - 0.1 K/uL  COMPREHENSIVE METABOLIC PANEL     Status: Abnormal   Collection Time    06/05/13  8:10 AM      Result Value Range   Sodium 138  137 - 147 mEq/L   Potassium 4.9  3.7 - 5.3 mEq/L   Chloride 103  96 - 112 mEq/L   CO2 22  19 - 32 mEq/L   Glucose, Bld 107 (*) 70 - 99 mg/dL   BUN 49 (*) 6 - 23 mg/dL   Creatinine, Ser 2.96 (*) 0.50 - 1.10 mg/dL   Calcium 9.1  8.4 - 10.5 mg/dL   Total Protein 7.0  6.0 - 8.3 g/dL   Albumin 3.3 (*) 3.5 - 5.2 g/dL   AST 50 (*) 0 - 37 U/L   ALT 49 (*) 0 - 35 U/L   Alkaline Phosphatase 73  39 - 117 U/L   Total Bilirubin 0.5  0.3 - 1.2 mg/dL   GFR calc non Af Amer 17 (*) >90 mL/min   GFR calc Af Amer 20 (*) >90 mL/min   Comment: (NOTE)     The eGFR has been calculated using the CKD EPI equation.     This calculation has not been validated in all clinical situations.     eGFR's persistently <90 mL/min signify possible Chronic Kidney     Disease.  PROTIME-INR     Status: None   Collection Time    06/05/13  8:10 AM      Result Value Range   Prothrombin Time 13.2  11.6 - 15.2 seconds   INR 1.02  0.00 - 1.49  APTT     Status: None   Collection Time     06/05/13  8:10 AM      Result Value Range   aPTT 29  24 - 37 seconds  TYPE AND SCREEN     Status: None   Collection Time    06/05/13  8:10 AM      Result Value Range   ABO/RH(D) O POS     Antibody Screen NEG     Sample Expiration 06/08/2013    ABO/RH     Status: None   Collection Time    06/05/13  8:10 AM      Result Value Range   ABO/RH(D) O POS      No results found.             Blood pressure 116/82, pulse 115, temperature 98.4 F (36.9 C), temperature source Oral, resp. rate 16, SpO2 100.00%.  Physical exam:   General-- an African-American female in no acute distress. She is nonicteric. No stigmata of chronic liver disease. Heart-- regular rate and rhythm without murmurs are gallops Lungs--clear Abdomen-- not distended soft and nontender. Unable to palpate liver. Specifically there's no significant tenderness in the lower abdomen.   Assessment: 1. Hematemesis. The patient's liver tests are normal and she has been taking ibuprofen. Hopefully this is just an NSAID induced ulcer. 2. Positive hepatitis C antibody. This apparently was discovered during her partial hysterectomy 6/14. HIV antibody was negative at that time. The patient does not clearly have any signs of chronic liver disease but we probably should obtain hepatitis C quantitative RNA in genotype.  Plan: 1. Will proceed with EGD later this afternoon. We have discuss that with the patient and she is agreeable. 2. We will go ahead and obtain quantitative hepatitis C in genotype: the hospital.   Deaunte Dente JR,Pennie Vanblarcom L 06/05/2013, 10:12 AM

## 2013-06-05 NOTE — Progress Notes (Signed)
UR completed 

## 2013-06-05 NOTE — H&P (Addendum)
[facility] History and Physical  Karen Morrow [id number] DOB: September 27, 1960 DOA: 06/05/2013  Referring physician: Dr. Kathrynn Humble PCP: No PCP Per Patient   Chief Complaint: hematemesis this am and dark stools  HPI: Karen Morrow is a 53 y.o. female  With history of positive hepatitis C antibodies but otherwise generally healthy per patient. She presents to the ED complaining of the above complaints. The dark stools started per patient 2 days ago. She had some abdominal discomfort and reports taking ibuprofen and yesterday but otherwise denies chronic use. She had 3 bouts of bloody emesis this morning and as a result presented to the ED for further evaluation. Patient denies any fevers, diarrhea, or any sick contacts. Currently denies any nausea or abdominal discomfort.  In the ED gastroenterology was consult at and plans have been set for EGD this afternoon. Protonix drip started while in the ED at initial hemoglobin level 10.4.   Review of Systems:  Constitutional:  No weight loss, night sweats, Fevers, chills, fatigue.  HEENT:  No headaches, Difficulty swallowing,Tooth/dental problems,Sore throat,  No sneezing, itching, ear ache, nasal congestion, post nasal drip,  Cardio-vascular:  No chest pain, Orthopnea, PND, swelling in lower extremities, anasarca, dizziness, palpitations  GI:  No heartburn, indigestion, abdominal pain (non currently), nausea, vomiting, diarrhea, change in bowel habits, loss of appetite, + dark stools recently Resp:  No shortness of breath with exertion or at rest. No excess mucus, no productive cough, No non-productive cough, No coughing up of blood.No change in color of mucus.No wheezing.No chest wall deformity  Skin:  no rash or lesions.  GU:  no dysuria, change in color of urine, no urgency or frequency. No flank pain.  Musculoskeletal:  No joint pain or swelling. No decreased range of motion. No back pain.  Psych:  No change in mood or  affect. No depression or anxiety. No memory loss.   Past Medical History  Diagnosis Date  . Asthma   . Hepatitis C antibody test positive   . Hypertension     "just dx'd today" (11/03/2012)  . Anginal pain    Past Surgical History  Procedure Laterality Date  . Abdominal hysterectomy      "partial" (11/03/2012)   Social History:  reports that she has been smoking Cigarettes.  She has a 10 pack-year smoking history. She has never used smokeless tobacco. She reports that she drinks about 27.6 ounces of alcohol per week. She reports that she uses illicit drugs (Marijuana).  No Known Allergies  History reviewed. No pertinent family history.   Prior to Admission medications   Medication Sig Start Date End Date Taking? Authorizing Provider  Ibuprofen-Diphenhydramine HCl (ADVIL PM) 200-25 MG CAPS Take 2 capsules by mouth at bedtime as needed (sleep/pain).   Yes Historical Provider, MD   Physical Exam: Filed Vitals:   06/05/13 0845  BP: 116/82  Pulse: 115  Temp:   Resp:     BP 116/82  Pulse 115  Temp(Src) 98.4 F (36.9 C) (Oral)  Resp 16  SpO2 100%  General:  Appears calm and comfortable Eyes: PERRL, normal lids, irises & conjunctiva ENT: grossly normal hearing, lips & tongue, dry mucous membranes Neck: no LAD, masses or thyromegaly Cardiovascular: RRR, no m/r/g. No LE edema. Telemetry: SR, no arrhythmias  Respiratory: CTA bilaterally, no w/r/r. Normal respiratory effort. Abdomen: soft, nt, nd Skin: no rash or induration seen on limited exam Musculoskeletal: grossly normal tone BUE/BLE Psychiatric: grossly normal mood and affect, speech fluent and appropriate  Neurologic: grossly non-focal.          Labs on Admission:  Basic Metabolic Panel:  Recent Labs Lab 06/05/13 0810  NA 138  K 4.9  CL 103  CO2 22  GLUCOSE 107*  BUN 49*  CREATININE 2.96*  CALCIUM 9.1   Liver Function Tests:  Recent Labs Lab 06/05/13 0810  AST 50*  ALT 49*  ALKPHOS 73  BILITOT 0.5   PROT 7.0  ALBUMIN 3.3*   No results found for this basename: LIPASE, AMYLASE,  in the last 168 hours No results found for this basename: AMMONIA,  in the last 168 hours CBC:  Recent Labs Lab 06/05/13 0810  WBC 12.4*  NEUTROABS 8.8*  HGB 10.4*  HCT 29.7*  MCV 93.1  PLT 195   Cardiac Enzymes: No results found for this basename: CKTOTAL, CKMB, CKMBINDEX, TROPONINI,  in the last 168 hours  BNP (last 3 results) No results found for this basename: PROBNP,  in the last 8760 hours CBG: No results found for this basename: GLUCAP,  in the last 168 hours  Radiological Exams on Admission: No results found.   Assessment/Plan Active Problems:   Upper GI bleeding   Hematemesis Tachycardia Positive hepatitis C antibody Acute renal failure   1. Upper GI bleed/Hematemesis - Will continue protonic drip - Gastroenterology consult by emergency room physician and plans are for her EGD this afternoon - Maintain patient n.p.o. we'll defer diet advancement to GI - Telemetry monitoring and CBC next a.m. should patient's hemoglobin level dropped low enough she is okay with getting transfusion of packed red blood cells  2. Tachycardia - most likely due to poor oral intake 2ary to # 1 as well as intermittent abdominal discomfort. - will monitor in telemetry - Place on MIVF's  Addendum  3. Positive Hepatitis C antibody - Plans are to check quantitative hepatitis C and genotype while in the hospital  4. Acute renal failure -Most likely due to prerenal causes given #1 - At this point we'll treat with maintenance IV fluids and reassess serum creatinine next a.m. should serum creatinine remaining elevated next a.m. we'll we'll plan on working patient up further.  Code Status: full code Family Communication: Discussed with husband Disposition Plan: Pending further recommendations from gastroenterologist after EGD evaluation and cessation of hematemesis and/or dark stools  Time spent: >  55 minutes  Velvet Bathe [facility] Pager [phone]

## 2013-06-05 NOTE — ED Notes (Signed)
Bed: [id number] Expected date:  Expected time:  Means of arrival:  Comments: Noreen- pt in endo, waiting for admit bed

## 2013-06-05 NOTE — Progress Notes (Signed)
[facility] provided pt with a list of primary care resources, [employer], and [facility] information.

## 2013-06-05 NOTE — ED Provider Notes (Addendum)
CSN: [id number]     Arrival date & time 06/05/13  0744 History   First MD Initiated Contact with Patient 06/05/13 [phone]     Chief Complaint  Patient presents with  . GI Bleeding   (Consider location/radiation/quality/duration/timing/severity/associated sxs/prior Treatment) HPI Comments: Pt with hx of HTN, not on regular ASAS, NSAIDS, anticoagulants comes in with cc of hematemesis. Pt has had 3 episodes of hematemesis, since 4 am, with passage of clots. Pt also has had dark stools x 2 since yday. No BRBPR. No abd pain or discomfort and no hx of GERD. No chest pain, dyspnea, or dizziness.   The history is provided by the patient.    Past Medical History  Diagnosis Date  . Asthma   . Hepatitis C antibody test positive   . Hypertension     "just dx'd today" (11/03/2012)  . Anginal pain    Past Surgical History  Procedure Laterality Date  . Abdominal hysterectomy      "partial" (11/03/2012)   No family history on file. History  Substance Use Topics  . Smoking status: Current Every Day Smoker -- 0.50 packs/day for 20 years    Types: Cigarettes  . Smokeless tobacco: Never Used  . Alcohol Use: 27.6 oz/week    46 Cans of beer per week     Comment: 11/03/2012 "1-2 40oz beers/day maybe"   OB History   Grav Para Term Preterm Abortions TAB SAB Ect Mult Living                 Review of Systems  Constitutional: Negative for activity change.  HENT: Negative for trouble swallowing.   Respiratory: Negative for cough, shortness of breath and wheezing.   Cardiovascular: Negative for chest pain.  Gastrointestinal: Positive for vomiting and blood in stool. Negative for nausea, abdominal pain, diarrhea, constipation and abdominal distention.  Genitourinary: Negative for hematuria and difficulty urinating.  Musculoskeletal: Negative for neck pain.  Skin: Negative for color change.  Neurological: Negative for dizziness and speech difficulty.  Hematological: Does not bruise/bleed easily.   Psychiatric/Behavioral: Negative for confusion.    Allergies  Review of patient's allergies indicates no known allergies.  Home Medications   Current Outpatient Rx  Name  Route  Sig  Dispense  Refill  . Ibuprofen-Diphenhydramine HCl (ADVIL PM) 200-25 MG CAPS   Oral   Take 2 capsules by mouth at bedtime as needed (sleep/pain).          BP 116/82  Pulse 115  Temp(Src) 98.4 F (36.9 C) (Oral)  Resp 16  SpO2 100% Physical Exam  Nursing note and vitals reviewed. Constitutional: She is oriented to person, place, and time. She appears well-developed and well-nourished.  HENT:  Head: Normocephalic and atraumatic.  Eyes: EOM are normal.  Neck: Neck supple.  Cardiovascular: Normal rate, regular rhythm and normal heart sounds.   No murmur heard. Pulmonary/Chest: Effort normal. No respiratory distress.  Abdominal: Soft. She exhibits no distension. There is no tenderness. There is no rebound and no guarding.  Neurological: She is alert and oriented to person, place, and time.  Skin: Skin is warm and dry.    ED Course  Procedures (including critical care time) Labs Review Labs Reviewed  CBC WITH DIFFERENTIAL - Abnormal; Notable for the following:    WBC 12.4 (*)    RBC 3.19 (*)    Hemoglobin 10.4 (*)    HCT 29.7 (*)    Neutro Abs 8.8 (*)    All other components within normal limits  PROTIME-INR  APTT  COMPREHENSIVE METABOLIC PANEL  TYPE AND SCREEN   Imaging Review No results found.  EKG Interpretation   None       MDM  No diagnosis found.  DDx includes: Esophagitis Mallory Weiss tear Variceal bleeding PUD/Gastritis/ulcers   Pt comes in with cc of hematemesis. 3 episodes so far, with dark tarry stools that are guaic positive in the ED.  Hb is 10.4, she has no signs of hypovolemia - except tachycardic (arrival - HR was 90, now 115). Likely at the early stages of bleeding - so no transfusion, we will type and screen and start protonix bolus and iv drip, as  UGIB suspected.  Pt has no liver dz, heavy nsaids use, asa use.  Spoke with Dr. Oletta Lamas, GI - he wants patient to stay npo, he will likely get an endoscopy at 1 pm. Medicine admission requested - spoke with Dr. Wendee Beavers, who wants patient to be admitted to tele. He is aware of tachycardia, no other hemodynamic instability.    CRITICAL CARE Performed by: Varney Biles   Total critical care time: 45 minutes - SUSPECTED Upper GI bleed with protonix drip and urgent endoscopy  Critical care time was exclusive of separately billable procedures and treating other patients.  Critical care was necessary to treat or prevent imminent or life-threatening deterioration.  Critical care was time spent personally by me on the following activities: development of treatment plan with patient and/or surrogate as well as nursing, discussions with consultants, evaluation of patient's response to treatment, examination of patient, obtaining history from patient or surrogate, ordering and performing treatments and interventions, ordering and review of laboratory studies, ordering and review of radiographic studies, pulse oximetry and re-evaluation of patient's condition.      Varney Biles, MD 06/05/13 [id number]  Varney Biles, MD 06/05/13 [phone]

## 2013-06-05 NOTE — H&P (View-Only) (Signed)
[facility] Reason for consult: Hematemesis Referring Physician: ER. No PCP. Patient is unassigned.  Karen Morrow is an 53 y.o. female.  HPI: she denies any history of prior G.I. bleeding. She denies history of liver disease and in spite of the fact that she carries a diagnosis of positive hepatitis C antibody denies ever having hepatitis C or knowledge of this positive antibody. She is not in any chronic medications. She does not see a doctor regularly. She has had previous partial hysterectomy and for the past 2 weeks has been having lower pelvic pain and was taking ibuprofen for this pain. Prior to this time, she denies taking any NSAIDs on a regular basis. She states that she took to ibuprofen at night to sleep because of this pain. The pain has improved. She woke up this morning with hematemesis of on a large quantity of clots and blood. She has had no indigestion heartburn or upper pain. She has had melenic stools for several days. Her hemoglobin in the ER is 10.4  Past Medical History  Diagnosis Date  . Asthma   . Hepatitis C antibody test positive   . Hypertension     "just dx'd today" (11/03/2012)  . Anginal pain     Past Surgical History  Procedure Laterality Date  . Abdominal hysterectomy      "partial" (11/03/2012)    No family history on file.  Social History:  reports that she has been smoking Cigarettes.  She has a 10 pack-year smoking history. She has never used smokeless tobacco. She reports that she drinks about 27.6 ounces of alcohol per week. She reports that she uses illicit drugs (Marijuana).  Allergies: No Known Allergies  Medications; Prior to Admission medications   Medication Sig Start Date End Date Taking? Authorizing Provider  Ibuprofen-Diphenhydramine HCl (ADVIL PM) 200-25 MG CAPS Take 2 capsules by mouth at bedtime as needed (sleep/pain).   Yes Historical Provider, MD     PRN Meds  Results for orders placed during the hospital  encounter of 06/05/13 (from the past 48 hour(s))  CBC WITH DIFFERENTIAL     Status: Abnormal   Collection Time    06/05/13  8:10 AM      Result Value Range   WBC 12.4 (*) 4.0 - 10.5 K/uL   RBC 3.19 (*) 3.87 - 5.11 MIL/uL   Hemoglobin 10.4 (*) 12.0 - 15.0 g/dL   HCT 29.7 (*) 36.0 - 46.0 %   MCV 93.1  78.0 - 100.0 fL   MCH 32.6  26.0 - 34.0 pg   MCHC 35.0  30.0 - 36.0 g/dL   RDW 13.5  11.5 - 15.5 %   Platelets 195  150 - 400 K/uL   Neutrophils Relative % 71  43 - 77 %   Neutro Abs 8.8 (*) 1.7 - 7.7 K/uL   Lymphocytes Relative 21  12 - 46 %   Lymphs Abs 2.6  0.7 - 4.0 K/uL   Monocytes Relative 6  3 - 12 %   Monocytes Absolute 0.7  0.1 - 1.0 K/uL   Eosinophils Relative 2  0 - 5 %   Eosinophils Absolute 0.2  0.0 - 0.7 K/uL   Basophils Relative 0  0 - 1 %   Basophils Absolute 0.0  0.0 - 0.1 K/uL  COMPREHENSIVE METABOLIC PANEL     Status: Abnormal   Collection Time    06/05/13  8:10 AM      Result Value Range   Sodium 138    137 - 147 mEq/L   Potassium 4.9  3.7 - 5.3 mEq/L   Chloride 103  96 - 112 mEq/L   CO2 22  19 - 32 mEq/L   Glucose, Bld 107 (*) 70 - 99 mg/dL   BUN 49 (*) 6 - 23 mg/dL   Creatinine, Ser 2.96 (*) 0.50 - 1.10 mg/dL   Calcium 9.1  8.4 - 10.5 mg/dL   Total Protein 7.0  6.0 - 8.3 g/dL   Albumin 3.3 (*) 3.5 - 5.2 g/dL   AST 50 (*) 0 - 37 U/L   ALT 49 (*) 0 - 35 U/L   Alkaline Phosphatase 73  39 - 117 U/L   Total Bilirubin 0.5  0.3 - 1.2 mg/dL   GFR calc non Af Amer 17 (*) >90 mL/min   GFR calc Af Amer 20 (*) >90 mL/min   Comment: (NOTE)     The eGFR has been calculated using the CKD EPI equation.     This calculation has not been validated in all clinical situations.     eGFR's persistently <90 mL/min signify possible Chronic Kidney     Disease.  PROTIME-INR     Status: None   Collection Time    06/05/13  8:10 AM      Result Value Range   Prothrombin Time 13.2  11.6 - 15.2 seconds   INR 1.02  0.00 - 1.49  APTT     Status: None   Collection Time     06/05/13  8:10 AM      Result Value Range   aPTT 29  24 - 37 seconds  TYPE AND SCREEN     Status: None   Collection Time    06/05/13  8:10 AM      Result Value Range   ABO/RH(D) O POS     Antibody Screen NEG     Sample Expiration 06/08/2013    ABO/RH     Status: None   Collection Time    06/05/13  8:10 AM      Result Value Range   ABO/RH(D) O POS      No results found.             Blood pressure 116/82, pulse 115, temperature 98.4 F (36.9 C), temperature source Oral, resp. rate 16, SpO2 100.00%.  Physical exam:   General-- an African-American female in no acute distress. She is nonicteric. No stigmata of chronic liver disease. Heart-- regular rate and rhythm without murmurs are gallops Lungs--clear Abdomen-- not distended soft and nontender. Unable to palpate liver. Specifically there's no significant tenderness in the lower abdomen.   Assessment: 1. Hematemesis. The patient's liver tests are normal and she has been taking ibuprofen. Hopefully this is just an NSAID induced ulcer. 2. Positive hepatitis C antibody. This apparently was discovered during her partial hysterectomy 6/14. HIV antibody was negative at that time. The patient does not clearly have any signs of chronic liver disease but we probably should obtain hepatitis C quantitative RNA in genotype.  Plan: 1. Will proceed with EGD later this afternoon. We have discuss that with the patient and she is agreeable. 2. We will go ahead and obtain quantitative hepatitis C in genotype: the hospital.   Neldon Shepard JR,Jonetta Dagley L 06/05/2013, 10:12 AM      

## 2013-06-05 NOTE — ED Notes (Signed)
Bed: [id number] Expected date:  Expected time:  Means of arrival:  Comments: EMS GI Bleed

## 2013-06-06 ENCOUNTER — Encounter (HOSPITAL_COMMUNITY): Payer: Self-pay | Admitting: Gastroenterology

## 2013-06-06 DIAGNOSIS — I10 Essential (primary) hypertension: Secondary | ICD-10-CM

## 2013-06-06 LAB — CBC
HCT: 22.7 % — ABNORMAL LOW (ref 36.0–46.0)
HCT: 23.7 % — ABNORMAL LOW (ref 36.0–46.0)
Hemoglobin: 7.9 g/dL — ABNORMAL LOW (ref 12.0–15.0)
Hemoglobin: 8.5 g/dL — ABNORMAL LOW (ref 12.0–15.0)
MCH: 32.5 pg (ref 26.0–34.0)
MCH: 32.1 pg (ref 26.0–34.0)
MCHC: 34.8 g/dL (ref 30.0–36.0)
MCHC: 35.9 g/dL (ref 30.0–36.0)
MCV: 93.4 fL (ref 78.0–100.0)
MCV: 89.4 fL (ref 78.0–100.0)
Platelets: 137 10*3/uL — ABNORMAL LOW (ref 150–400)
Platelets: 117 10*3/uL — ABNORMAL LOW (ref 150–400)
RBC: 2.43 MIL/uL — ABNORMAL LOW (ref 3.87–5.11)
RBC: 2.65 MIL/uL — ABNORMAL LOW (ref 3.87–5.11)
RDW: 13.8 % (ref 11.5–15.5)
RDW: 15.4 % (ref 11.5–15.5)
WBC: 9 10*3/uL (ref 4.0–10.5)
WBC: 6.8 10*3/uL (ref 4.0–10.5)

## 2013-06-06 LAB — BASIC METABOLIC PANEL
BUN: 45 mg/dL — ABNORMAL HIGH (ref 6–23)
CO2: 19 mEq/L (ref 19–32)
Calcium: 8.7 mg/dL (ref 8.4–10.5)
Chloride: 107 mEq/L (ref 96–112)
Creatinine, Ser: 2.93 mg/dL — ABNORMAL HIGH (ref 0.50–1.10)
GFR calc Af Amer: 20 mL/min — ABNORMAL LOW (ref >90)
GFR calc non Af Amer: 17 mL/min — ABNORMAL LOW (ref >90)
Glucose, Bld: 100 mg/dL — ABNORMAL HIGH (ref 70–99)
Potassium: 4.2 mEq/L (ref 3.7–5.3)
Sodium: 137 mEq/L (ref 137–147)

## 2013-06-06 LAB — PREPARE RBC (CROSSMATCH)

## 2013-06-06 MED ORDER — LORAZEPAM 0.5 MG PO TABS
0.5000 mg | ORAL_TABLET | Freq: Once | ORAL | Status: AC
  Administered 2013-06-06: 0.5 mg via ORAL
  Filled 2013-06-06: qty 1

## 2013-06-06 NOTE — Progress Notes (Signed)
Pt c/o SOB, chest tightness, and HR went up to 172 bpm after walking from bathroom back to bed. Attempted to place 2 L of Oxygen via nasal cannula for SOB, but she refused. After 5 min of laying down in bed, Pt stated that her symptoms went away. Patient HR is now in NSR. According to H&P patient has a history of anginal pain. NP on call notified via text page. No response to text page. No new orders placed. Will continue to monitor.

## 2013-06-06 NOTE — Care Management Note (Addendum)
    Page 1 of 1   06/07/2013     12:13:06 PM   CARE MANAGEMENT NOTE 06/07/2013  Patient:  Karen Morrow, Karen Morrow   Account Number:  [id number]  Date Initiated:  06/06/2013  Documentation initiated by:  Dessa Phi  Subjective/Objective Assessment:   53 Y/O F ADMITTED W/HEMATEMESIS.     Action/Plan:   FROM HOME.NO INSURANCE.NO PCP.   Anticipated DC Date:  06/07/2013   Anticipated DC Plan:  HOME/SELF CARE  In-house referral  [facility]  Medication Assistance      Choice offered to / List presented to:             Status of service:  Completed, signed off Medicare Important Message given?   (If response is "NO", the following Medicare IM given date fields will be blank) Date Medicare IM given:   Date Additional Medicare IM given:    Discharge Disposition:  HOME/SELF CARE  Per UR Regulation:  Reviewed for med. necessity/level of care/duration of stay  If discussed at [place] of Stay Meetings, dates discussed:    Comments:  06/07/13 Demetreus Lothamer RN,BSN NCM [place] $14.NO MED ASSIST NEEDED.PCP APPT SET W/COMMUNITY & [place], SEE D/C F/U SECTION.  06/05/13 Zamire Whitehurst RN,BSN NCM [phone] PROVIDED W/PCP [place].WILL SET UP APPT.$4 [facility] MED LIST GIVEN.CAN AFFORD $4 MEDS  IF ON [facility] MED LIST.OTHER COMMUNITY RESOURCES GIVEN.

## 2013-06-06 NOTE — Progress Notes (Signed)
[facility] PROGRESS NOTE Subjective No gross bleeding getting blood Hg 7.9 today ? dilutional  Objective: Vital signs in last 24 hours: Temp:  [97.8 F (36.6 C)-98.7 F (37.1 C)] 98.3 F (36.8 C) (01/07 1130) Pulse Rate:  [74-120] 74 (01/07 1130) Resp:  [12-71] 18 (01/07 1130) BP: (101-190)/(57-104) 156/100 mmHg (01/07 1130) SpO2:  [97 %-100 %] 100 % (01/07 1130) Weight:  [58.7 kg (129 lb 6.6 oz)] 58.7 kg (129 lb 6.6 oz) (01/06 1549) Last BM Date: 06/05/13  Intake/Output from previous day: 01/06 0701 - 01/07 0700 In: 2173.8 [P.O.:240; I.V.:1933.8] Out: -  Intake/Output this shift: Total I/O In: 232.5 [P.O.:120; Blood:112.5] Out: -   PE: - Abdomen--soft nontender  Lab Results:  Recent Labs  06/05/13 0810 06/06/13 0530  WBC 12.4* 9.0  HGB 10.4* 7.9*  HCT 29.7* 22.7*  PLT 195 137*   BMET  Recent Labs  06/05/13 0810 06/06/13 0530  NA 138 137  K 4.9 4.2  CL 103 107  CO2 22 19  CREATININE 2.96* 2.93*   LFT  Recent Labs  06/05/13 0810  PROT 7.0  AST 50*  ALT 49*  ALKPHOS 73  BILITOT 0.5   PT/INR  Recent Labs  06/05/13 0810  LABPROT 13.2  INR 1.02   PANCREAS No results found for this basename: LIPASE,  in the last 72 hours       Studies/Results: No results found.  Medications: I have reviewed the patient's current medications.  Assessment/Plan: 1. Gastric Ulcer . Grossly stable will try soft foods   Sashia Campas JR,Lott Seelbach L 06/06/2013, 12:02 PM

## 2013-06-07 DIAGNOSIS — D649 Anemia, unspecified: Secondary | ICD-10-CM

## 2013-06-07 DIAGNOSIS — K7460 Unspecified cirrhosis of liver: Secondary | ICD-10-CM

## 2013-06-07 DIAGNOSIS — K259 Gastric ulcer, unspecified as acute or chronic, without hemorrhage or perforation: Secondary | ICD-10-CM

## 2013-06-07 DIAGNOSIS — K920 Hematemesis: Secondary | ICD-10-CM

## 2013-06-07 LAB — BASIC METABOLIC PANEL
BUN: 31 mg/dL — ABNORMAL HIGH (ref 6–23)
CO2: 19 mEq/L (ref 19–32)
Calcium: 9 mg/dL (ref 8.4–10.5)
Chloride: 106 mEq/L (ref 96–112)
Creatinine, Ser: 2.73 mg/dL — ABNORMAL HIGH (ref 0.50–1.10)
GFR calc Af Amer: 22 mL/min — ABNORMAL LOW (ref >90)
GFR calc non Af Amer: 19 mL/min — ABNORMAL LOW (ref >90)
Glucose, Bld: 101 mg/dL — ABNORMAL HIGH (ref 70–99)
Potassium: 4.5 mEq/L (ref 3.7–5.3)
Sodium: 137 mEq/L (ref 137–147)

## 2013-06-07 LAB — TYPE AND SCREEN
ABO/RH(D): O POS
Antibody Screen: NEGATIVE
Unit division: 0

## 2013-06-07 LAB — CBC
HCT: 27.1 % — ABNORMAL LOW (ref 36.0–46.0)
Hemoglobin: 9.2 g/dL — ABNORMAL LOW (ref 12.0–15.0)
MCH: 30.8 pg (ref 26.0–34.0)
MCHC: 33.9 g/dL (ref 30.0–36.0)
MCV: 90.6 fL (ref 78.0–100.0)
Platelets: 119 10*3/uL — ABNORMAL LOW (ref 150–400)
RBC: 2.99 MIL/uL — ABNORMAL LOW (ref 3.87–5.11)
RDW: 16.1 % — ABNORMAL HIGH (ref 11.5–15.5)
WBC: 6.4 10*3/uL (ref 4.0–10.5)

## 2013-06-07 LAB — HCV RNA QUANT RFLX ULTRA OR GENOTYP
HCV Quantitative Log: 6.19 {Log} — ABNORMAL HIGH (ref <1.18)
HCV Quantitative: 1559574 IU/mL — ABNORMAL HIGH (ref <15)

## 2013-06-07 MED ORDER — HYDRALAZINE HCL 20 MG/ML IJ SOLN
5.0000 mg | INTRAMUSCULAR | Status: DC | PRN
  Administered 2013-06-07: 5 mg via INTRAVENOUS
  Filled 2013-06-07: qty 1

## 2013-06-07 MED ORDER — PANTOPRAZOLE SODIUM 40 MG PO TBEC: 40.0000 mg | DELAYED_RELEASE_TABLET | Freq: Two times a day (BID) | ORAL | Status: DC

## 2013-06-07 NOTE — Progress Notes (Signed)
[facility] PROGRESS NOTE Subjective No gross bleeding   Objective: Vital signs in last 24 hours: Temp:  [97.7 F (36.5 C)-98.4 F (36.9 C)] 97.7 F (36.5 C) (01/07 2104) Pulse Rate:  [74-84] 75 (01/07 2104) Resp:  [18-20] 18 (01/07 2104) BP: (126-171)/(87-115) 148/94 mmHg (01/08 0604) SpO2:  [100 %] 100 % (01/07 2104) Last BM Date: 06/05/13  Intake/Output from previous day: 01/07 0701 - 01/08 0700 In: 4224.6 [P.O.:1020; I.V.:2992.1; Blood:212.5] Out: -  Intake/Output this shift: Total I/O In: 120 [P.O.:120] Out: -   PE:  Abdomen--soft nontender  Lab Results:  Recent Labs  06/05/13 0810 06/06/13 0530 06/06/13 1810 06/07/13 0456  WBC 12.4* 9.0 6.8 6.4  HGB 10.4* 7.9* 8.5* 9.2*  HCT 29.7* 22.7* 23.7* 27.1*  PLT 195 137* 117* 119*   BMET  Recent Labs  06/05/13 0810 06/06/13 0530  NA 138 137  K 4.9 4.2  CL 103 107  CO2 22 19  CREATININE 2.96* 2.93*   LFT  Recent Labs  06/05/13 0810  PROT 7.0  AST 50*  ALT 49*  ALKPHOS 73  BILITOT 0.5   PT/INR  Recent Labs  06/05/13 0810  LABPROT 13.2  INR 1.02   PANCREAS No results found for this basename: LIPASE,  in the last 72 hours       Studies/Results: No results found.  Medications: I have reviewed the patient's current medications.  Assessment/Plan: 1. UGI Bleed due to GUs. Stable ok to discharge home on PPI BID and follow up with me in 3-4 weeks no NASAIDS   Fritz Cauthon JR,Dorsel Flinn L 06/07/2013, 9:07 AM

## 2013-06-07 NOTE — Discharge Summary (Signed)
Physician Discharge Summary  Karen Morrow [id number] DOB: [birth year] DOA: 06/05/2013  PCP: No PCP Per Patient  Admit date: 06/05/2013 Discharge date: 06/07/2013  Time spent: > 35  minutes  Recommendations for Outpatient Follow-up:  1. Continue to encourage tobacco cessation 2. Patient will need followup with gastroenterologist in 3-4 weeks from hospital discharge. 3. Patient is to avoid nonsteroidal anti-inflammatories. 4. Reassess serum creatinine as outpatient to continue to ensure that it is trending down 5. If it has not already been done please work patient up for elevated serum creatinine which has been a chronic problem based on review of EMR  Discharge Diagnoses:  Principal Problem:   Upper GI bleeding Active Problems:   Acute renal failure   Hepatitis C antibody test positive   Hematemesis   Tachycardia   Gastric ulcer   Discharge Condition: Stable  Diet recommendation: Regular diet  Filed Weights   06/05/13 1132 06/05/13 1549  Weight: 58.968 kg (130 lb) 58.7 kg (129 lb 6.6 oz)    History of present illness:  Patient is a 53 year old African American female with positive hepatitis C antibodies. Who presented with hematemesis and melena. On evaluation was found to have a gastric ulcer.  Hospital Course:  Principal Problem:  Upper GI bleeding  - EGD would reveal gastric ulcer as cause. Next currently bleeding and biopsies obtained for etiology. At this point suspect NSAIDs as contributing to her problem as such patient is to avoid nonsteroidal anti-inflammatories and I have discussed this with her. She verbalizes agreement and understanding.  Gastric ulcer - As mentioned above workup and biopsies obtained. Patient is to avoid NSAIDs as well as alcohol or tobacco  - Gastroenterologist recommended followup within 3-4 weeks  Active Problems:  Acute renal failure  - Most likely due to poor oral intake initially we'll plan on reassessing serum creatinine prior to  discharge  - Patient denies any problems with voiding and upon review of chart patient's serum creatinine level at baseline elevated with baseline near 2.5  Hepatitis C antibody test positive  - Gastroenterologist on board and following. Plan will be for patient to followup with gastroenterologist as outpatient for further evaluation and recommendations.   Hematemesis  - Resolved further workup underway by gastroenterologist  -  Discharge date patient denies any nausea   Procedures:  Upper EGD  Consultations:  GI: Dr. Oletta Lamas  Discharge Exam: Filed Vitals:   06/07/13 0604  BP: 148/94  Pulse:   Temp:   Resp:     General: Pt in NAD, alert and awake Cardiovascular: RRR, no MRG Respiratory: CTA BL, no wheezes Abdomen: soft, NT, ND  Discharge Instructions  Discharge Orders   Future Orders Complete By Expires   Call MD for:  persistant dizziness or light-headedness  As directed    Call MD for:  severe uncontrolled pain  As directed    Call MD for:  temperature >100.4  As directed    Diet - low sodium heart healthy  As directed    Discharge instructions  As directed    Comments:     Please be sure to avoid ibuprofen, Motrin, Advil, or any nonsteroidal anti-inflammatories. Also should avoid smoking tobacco or alcohol intake. You're to follow up with your gastroenterologist in 3-4 weeks.   Increase activity slowly  As directed        Medication List    STOP taking these medications       ADVIL PM 200-25 MG Caps  Generic drug:  Ibuprofen-Diphenhydramine HCl  TAKE these medications       pantoprazole 40 MG tablet  Commonly known as:  PROTONIX  Take 1 tablet (40 mg total) by mouth 2 (two) times daily.       No Known Allergies    The results of significant diagnostics from this hospitalization (including imaging, microbiology, ancillary and laboratory) are listed below for reference.    Significant Diagnostic Studies: No results  found.  Microbiology: No results found for this or any previous visit (from the past 240 hour(s)).   Labs: Basic Metabolic Panel:  Recent Labs Lab 06/05/13 0810 06/06/13 0530  NA 138 137  K 4.9 4.2  CL 103 107  CO2 22 19  GLUCOSE 107* 100*  BUN 49* 45*  CREATININE 2.96* 2.93*  CALCIUM 9.1 8.7   Liver Function Tests:  Recent Labs Lab 06/05/13 0810  AST 50*  ALT 49*  ALKPHOS 73  BILITOT 0.5  PROT 7.0  ALBUMIN 3.3*   No results found for this basename: LIPASE, AMYLASE,  in the last 168 hours No results found for this basename: AMMONIA,  in the last 168 hours CBC:  Recent Labs Lab 06/05/13 0810 06/06/13 0530 06/06/13 1810 06/07/13 0456  WBC 12.4* 9.0 6.8 6.4  NEUTROABS 8.8*  --   --   --   HGB 10.4* 7.9* 8.5* 9.2*  HCT 29.7* 22.7* 23.7* 27.1*  MCV 93.1 93.4 89.4 90.6  PLT 195 137* 117* 119*   Cardiac Enzymes: No results found for this basename: CKTOTAL, CKMB, CKMBINDEX, TROPONINI,  in the last 168 hours BNP: BNP (last 3 results) No results found for this basename: PROBNP,  in the last 8760 hours CBG: No results found for this basename: GLUCAP,  in the last 168 hours     Signed:  Velvet Bathe  [facility] 06/07/2013, 10:44 AM

## 2013-06-07 NOTE — Progress Notes (Signed)
[facility] PROGRESS NOTE  Karen Morrow [id number] DOB: 11/02/1960 DOA: 06/05/2013 PCP: No PCP Per Patient  Assessment/Plan: Principal Problem:   Upper GI bleeding - Workup underway patient to be evaluated by gastroenterologist with plans for EGD  Active Problems:   Acute renal failure - Most likely due to poor oral intake initially we'll plan on reassessing serum creatinine prior to discharge    Hepatitis C antibody test positive - Gastroenterologist on board and following. Plan will be for patient to followup with gastroenterologist as outpatient for further evaluation and recommendations.    Hematemesis - Resolved further workup underway by gastroenterologist    Tachycardia - has resolved and most likely secondary to principal problem and low intravascular volume secondary to poor oral intake   Code Status: Full Family Communication: No family at bedside  Disposition Plan: Pending further evaluation by gastroenterologist.   Consultants:  Gastroenterology: Dr. Oletta Lamas  Procedures:  Upper EGD  Antibiotics:  None  HPI/Subjective: Patient denies any abdominal discomfort or any nausea or hematemesis  Objective: Filed Vitals:   06/07/13 0604  BP: 148/94  Pulse:   Temp:   Resp:     Intake/Output Summary (Last 24 hours) at 06/07/13 1052 Last data filed at 06/07/13 0825  Gross per 24 hour  Intake 4224.58 ml  Output      0 ml  Net 4224.58 ml   Filed Weights   06/05/13 1132 06/05/13 1549  Weight: 58.968 kg (130 lb) 58.7 kg (129 lb 6.6 oz)    Exam:   General:  Pt in nAD, alert and awake  Cardiovascular: RRR, no MRG  Respiratory: CTA BL, no wheezes  Abdomen: soft, NT, ND  Musculoskeletal: no cyanosis or clubbing   Data Reviewed: Basic Metabolic Panel:  Recent Labs Lab 06/05/13 0810 06/06/13 0530  NA 138 137  K 4.9 4.2  CL 103 107  CO2 22 19  GLUCOSE 107* 100*  BUN 49* 45*  CREATININE 2.96* 2.93*  CALCIUM 9.1 8.7   Liver  Function Tests:  Recent Labs Lab 06/05/13 0810  AST 50*  ALT 49*  ALKPHOS 73  BILITOT 0.5  PROT 7.0  ALBUMIN 3.3*   No results found for this basename: LIPASE, AMYLASE,  in the last 168 hours No results found for this basename: AMMONIA,  in the last 168 hours CBC:  Recent Labs Lab 06/05/13 0810 06/06/13 0530 06/06/13 1810 06/07/13 0456  WBC 12.4* 9.0 6.8 6.4  NEUTROABS 8.8*  --   --   --   HGB 10.4* 7.9* 8.5* 9.2*  HCT 29.7* 22.7* 23.7* 27.1*  MCV 93.1 93.4 89.4 90.6  PLT 195 137* 117* 119*   Cardiac Enzymes: No results found for this basename: CKTOTAL, CKMB, CKMBINDEX, TROPONINI,  in the last 168 hours BNP (last 3 results) No results found for this basename: PROBNP,  in the last 8760 hours CBG: No results found for this basename: GLUCAP,  in the last 168 hours  No results found for this or any previous visit (from the past 240 hour(s)).   Studies: No results found.  Scheduled Meds: . sodium chloride  3 mL Intravenous Q12H   Continuous Infusions: . dextrose 5 % and 0.45 % NaCl with KCl 20 mEq/L 100 mL/hr at 06/06/13 2300  . pantoprozole (PROTONIX) infusion 8 mg/hr (06/07/13 0641)     Time spent: > 35 minutes    Velvet Bathe  [facility] Pager [phone] If 7PM-7AM, please contact night-coverage at [email or web address], password [facility] 06/07/2013, 10:52 AM  LOS: 2 days

## 2013-06-08 LAB — HEPATITIS C GENOTYPE

## 2013-07-12 ENCOUNTER — Inpatient Hospital Stay: Payer: Self-pay | Admitting: Internal Medicine

## 2013-07-27 ENCOUNTER — Inpatient Hospital Stay: Payer: Self-pay

## 2013-08-03 ENCOUNTER — Inpatient Hospital Stay: Payer: Self-pay

## 2013-08-09 ENCOUNTER — Encounter: Payer: Self-pay | Admitting: Internal Medicine

## 2013-08-09 ENCOUNTER — Ambulatory Visit: Payer: Self-pay | Attending: Internal Medicine | Admitting: Internal Medicine

## 2013-08-09 VITALS — BP 188/119 | HR 86 | Temp 98.4°F | Resp 18 | Ht 67.0 in | Wt 138.0 lb

## 2013-08-09 DIAGNOSIS — I129 Hypertensive chronic kidney disease with stage 1 through stage 4 chronic kidney disease, or unspecified chronic kidney disease: Secondary | ICD-10-CM | POA: Insufficient documentation

## 2013-08-09 DIAGNOSIS — N183 Chronic kidney disease, stage 3 unspecified: Secondary | ICD-10-CM | POA: Insufficient documentation

## 2013-08-09 DIAGNOSIS — Z09 Encounter for follow-up examination after completed treatment for conditions other than malignant neoplasm: Secondary | ICD-10-CM | POA: Insufficient documentation

## 2013-08-09 DIAGNOSIS — F17200 Nicotine dependence, unspecified, uncomplicated: Secondary | ICD-10-CM | POA: Insufficient documentation

## 2013-08-09 DIAGNOSIS — I10 Essential (primary) hypertension: Secondary | ICD-10-CM

## 2013-08-09 LAB — BASIC METABOLIC PANEL
BUN: 33 mg/dL — ABNORMAL HIGH (ref 6–23)
CO2: 20 mEq/L (ref 19–32)
Calcium: 8.6 mg/dL (ref 8.4–10.5)
Chloride: 110 mEq/L (ref 96–112)
Creat: 3.61 mg/dL — ABNORMAL HIGH (ref 0.50–1.10)
Glucose, Bld: 93 mg/dL (ref 70–99)
Potassium: 4 mEq/L (ref 3.5–5.3)
Sodium: 138 mEq/L (ref 135–145)

## 2013-08-09 LAB — CBC WITH DIFFERENTIAL/PLATELET
Basophils Absolute: 0.1 10*3/uL (ref 0.0–0.1)
Basophils Relative: 1 % (ref 0–1)
Eosinophils Absolute: 0.3 10*3/uL (ref 0.0–0.7)
Eosinophils Relative: 4 % (ref 0–5)
HCT: 32.3 % — ABNORMAL LOW (ref 36.0–46.0)
Hemoglobin: 11.1 g/dL — ABNORMAL LOW (ref 12.0–15.0)
Lymphocytes Relative: 30 % (ref 12–46)
Lymphs Abs: 2 10*3/uL (ref 0.7–4.0)
MCH: 32.3 pg (ref 26.0–34.0)
MCHC: 34.4 g/dL (ref 30.0–36.0)
MCV: 93.9 fL (ref 78.0–100.0)
Monocytes Absolute: 0.5 10*3/uL (ref 0.1–1.0)
Monocytes Relative: 7 % (ref 3–12)
Neutro Abs: 3.9 10*3/uL (ref 1.7–7.7)
Neutrophils Relative %: 58 % (ref 43–77)
Platelets: 179 10*3/uL (ref 150–400)
RBC: 3.44 MIL/uL — ABNORMAL LOW (ref 3.87–5.11)
RDW: 14 % (ref 11.5–15.5)
WBC: 6.8 10*3/uL (ref 4.0–10.5)

## 2013-08-09 LAB — LIPID PANEL
Cholesterol: 149 mg/dL (ref 0–200)
HDL: 70 mg/dL (ref >39)
LDL Cholesterol: 62 mg/dL (ref 0–99)
Total CHOL/HDL Ratio: 2.1 Ratio
Triglycerides: 84 mg/dL (ref <150)
VLDL: 17 mg/dL (ref 0–40)

## 2013-08-09 LAB — TSH: TSH: 1.132 u[IU]/mL (ref 0.350–4.500)

## 2013-08-09 MED ORDER — ZOLPIDEM TARTRATE 10 MG PO TABS: 10.0000 mg | ORAL_TABLET | Freq: Every evening | ORAL | Status: DC | PRN

## 2013-08-09 MED ORDER — PANTOPRAZOLE SODIUM 40 MG PO TBEC: 40.0000 mg | DELAYED_RELEASE_TABLET | Freq: Two times a day (BID) | ORAL | Status: DC

## 2013-08-09 MED ORDER — HYDRALAZINE HCL 25 MG PO TABS: 25.0000 mg | ORAL_TABLET | Freq: Three times a day (TID) | ORAL | Status: DC

## 2013-08-09 MED ORDER — CLONIDINE HCL 0.1 MG PO TABS
0.2000 mg | ORAL_TABLET | Freq: Once | ORAL | Status: AC
  Administered 2013-08-09: 0.2 mg via ORAL

## 2013-08-09 NOTE — Patient Instructions (Signed)

## 2013-08-09 NOTE — Progress Notes (Signed)
HFU- s/p upper gi bleed with GI referral HTN- BP 188/119 86 Clonidine 0.2 mg given Denies bleeding this visit

## 2013-08-09 NOTE — Progress Notes (Signed)
Patient ID: Karen Morrow, female   DOB: 02-03-1961, 53 y.o.   MRN: [id number]   CC: follow up and BP check   HPI: Patient is 53 year old female who presents for followup. She was recently hospitalized and found to have gastric ulcer secondary to NSAID use. She says she's feeling better, denies any abdominal or urinary concerns, no chest pain. She has been checking her blood pressure and it is consistently greater than 140/90 but she does not have paper where she documented the numbers.  No Known Allergies Past Medical History  Diagnosis Date  . Asthma   . Hepatitis C antibody test positive   . Hypertension     "just dx'd today" (11/03/2012)  . Anginal pain    No current outpatient prescriptions on file prior to visit.   No current facility-administered medications on file prior to visit.   No known family medical history History   Social History  . Marital Status: Married    Spouse Name: N/A    Number of Children: N/A  . Years of Education: N/A   Occupational History  . Not on file.   Social History Main Topics  . Smoking status: Current Every Day Smoker -- 0.50 packs/day for 20 years    Types: Cigarettes  . Smokeless tobacco: Never Used  . Alcohol Use: 27.6 oz/week    46 Cans of beer per week     Comment: 11/03/2012 "1-2 40oz beers/day maybe"  . Drug Use: Yes    Special: Marijuana     Comment: 11/03/2012 "maybe once or twice/wk"  . Sexual Activity: Not Currently   Other Topics Concern  . Not on file   Social History Narrative  . No narrative on file    Review of Systems  Constitutional: Negative for fever, chills, diaphoresis, activity change, appetite change and fatigue.  HENT: Negative for ear pain, nosebleeds, congestion, facial swelling, rhinorrhea, neck pain, neck stiffness and ear discharge.   Eyes: Negative for pain, discharge, redness, itching and visual disturbance.  Respiratory: Negative for cough, choking, chest tightness, shortness of breath, wheezing  and stridor.   Cardiovascular: Negative for chest pain, palpitations and leg swelling.  Gastrointestinal: Negative for abdominal distention.  Genitourinary: Negative for dysuria, urgency, frequency, hematuria, flank pain, decreased urine volume, difficulty urinating and dyspareunia.  Musculoskeletal: Negative for back pain, joint swelling, arthralgias and gait problem.  Neurological: Negative for dizziness, tremors, seizures, syncope, facial asymmetry, speech difficulty, weakness, light-headedness, numbness and headaches.  Hematological: Negative for adenopathy. Does not bruise/bleed easily.  Psychiatric/Behavioral: Negative for hallucinations, behavioral problems, confusion, dysphoric mood, decreased concentration and agitation.    Objective:   Filed Vitals:   08/09/13 1509  BP: 188/119  Pulse: 86  Temp: 98.4 F (36.9 C)  Resp: 18    Physical Exam  Constitutional: Appears well-developed and well-nourished. No distress.  HENT: Normocephalic. External right and left ear normal. Oropharynx is clear and moist.  Eyes: Conjunctivae and EOM are normal. PERRLA, no scleral icterus.  Neck: Normal ROM. Neck supple. No JVD. No tracheal deviation. No thyromegaly.  CVS: RRR, S1/S2 +, no murmurs, no gallops, no carotid bruit.  Pulmonary: Effort and breath sounds normal, no stridor, rhonchi, wheezes, rales.  Abdominal: Soft. BS +,  no distension, tenderness, rebound or guarding.  Musculoskeletal: Normal range of motion. No edema and no tenderness.  Lymphadenopathy: No lymphadenopathy noted, cervical, inguinal. Neuro: Alert. Normal reflexes, muscle tone coordination. No cranial nerve deficit. Skin: Skin is warm and dry. No rash noted. Not diaphoretic.  No erythema. No pallor.  Psychiatric: Normal mood and affect. Behavior, judgment, thought content normal.   Lab Results  Component Value Date   WBC 6.4 06/07/2013   HGB 9.2* 06/07/2013   HCT 27.1* 06/07/2013   MCV 90.6 06/07/2013   PLT 119* 06/07/2013    Lab Results  Component Value Date   CREATININE 2.73* 06/07/2013   BUN 31* 06/07/2013   NA 137 06/07/2013   K 4.5 06/07/2013   CL 106 06/07/2013   CO2 19 06/07/2013    No results found for this basename: HGBA1C   Lipid Panel  No results found for this basename: chol, trig, hdl, cholhdl, vldl, ldlcalc   Assessment and plan:   Patient Active Problem List   Diagnosis Date Noted  . Gastric ulcer - stable, continue protonic 40 mg by mouth twice a day  06/07/2013  . Upper GI bleeding - secondary to gastric ulcer in the setting of NSAID use, discussed complete avoidance of NSAIDs. Biopsy negative for malignancy.  06/05/2013  .  accelerated hypertension  - will start hydralazine 25 mg by mouth 3 times a day. Avoid ACE inhibitors/ARB's due to chronic renal failure stage III. Repeat electrolyte panel today, check lipid panel, CBC, TSH. Patient advised to check her blood pressure regularly and to call us back if the numbers are persistently higher than 140/90. She needs to come back in one week for reevaluation.

## 2013-09-11 ENCOUNTER — Ambulatory Visit: Payer: Self-pay | Admitting: Internal Medicine

## 2013-09-13 ENCOUNTER — Emergency Department (HOSPITAL_COMMUNITY)
Admission: EM | Admit: 2013-09-13 | Discharge: 2013-09-13 | Disposition: A | Discharge status: Home / Self Care | Payer: Medicaid Other | Attending: Emergency Medicine | Admitting: Emergency Medicine

## 2013-09-13 ENCOUNTER — Encounter (HOSPITAL_COMMUNITY): Payer: Self-pay | Admitting: Emergency Medicine

## 2013-09-13 DIAGNOSIS — F17200 Nicotine dependence, unspecified, uncomplicated: Secondary | ICD-10-CM | POA: Insufficient documentation

## 2013-09-13 DIAGNOSIS — Z79899 Other long term (current) drug therapy: Secondary | ICD-10-CM | POA: Insufficient documentation

## 2013-09-13 DIAGNOSIS — R21 Rash and other nonspecific skin eruption: Secondary | ICD-10-CM | POA: Insufficient documentation

## 2013-09-13 DIAGNOSIS — J45909 Unspecified asthma, uncomplicated: Secondary | ICD-10-CM | POA: Insufficient documentation

## 2013-09-13 DIAGNOSIS — Z8619 Personal history of other infectious and parasitic diseases: Secondary | ICD-10-CM | POA: Insufficient documentation

## 2013-09-13 DIAGNOSIS — I10 Essential (primary) hypertension: Secondary | ICD-10-CM | POA: Insufficient documentation

## 2013-09-13 MED ORDER — PERMETHRIN 5 % EX CREA: TOPICAL_CREAM | CUTANEOUS | Status: DC

## 2013-09-13 MED ORDER — HYDROXYZINE HCL 25 MG PO TABS: 25.0000 mg | ORAL_TABLET | Freq: Four times a day (QID) | ORAL | Status: DC

## 2013-09-13 NOTE — ED Notes (Signed)
Started 2 weeks ago with "small bites" to arms which then increased to itching rash entire body. States was living in a motel at the time.

## 2013-09-13 NOTE — Discharge Instructions (Signed)
Use permethrin cream as directed. Take Atarax as needed for itching. Refer to attached documents for more information. Return to the ED with worsening or concerning symptoms.

## 2013-09-13 NOTE — ED Provider Notes (Signed)
CSN: [id number]     Arrival date & time 09/13/13  [id number] History  This chart was scribed for non-physician practitioner, Alvina Chou, PA-C working with Alfonzo Feller, DO by Frederich Balding, ED scribe. This patient was seen in room TR08C/TR08C and the patient's care was started at 9:54 AM.    Chief Complaint  Patient presents with  . Rash   The history is provided by the patient. No language interpreter was used.   HPI Comments: Karen Morrow is a 53 y.o. female who presents to the Emergency Department complaining of an itchy rash to her arms, legs, back and torso that started 2 weeks ago. Pt states no one at home has a similar rash. She recently stayed in a hotel for 9 months and started noticing bites towards the end of her stay. Denies history of psoriasis or eczema.   Past Medical History  Diagnosis Date  . Asthma   . Hepatitis C antibody test positive   . Hypertension     "just dx'd today" (11/03/2012)  . Anginal pain    Past Surgical History  Procedure Laterality Date  . Abdominal hysterectomy      "partial" (11/03/2012)  . Esophagogastroduodenoscopy N/A 06/05/2013    Procedure: ESOPHAGOGASTRODUODENOSCOPY (EGD);  Surgeon: Winfield Cunas., MD;  Location: [facility];  Service: Endoscopy;  Laterality: N/A;   No family history on file. History  Substance Use Topics  . Smoking status: Current Every Day Smoker -- 0.50 packs/day for 20 years    Types: Cigarettes  . Smokeless tobacco: Never Used  . Alcohol Use: 27.6 oz/week    46 Cans of beer per week     Comment: 11/03/2012 "1-2 40oz beers/day maybe"   OB History   Grav Para Term Preterm Abortions TAB SAB Ect Mult Living                 Review of Systems  Skin: Positive for rash.  All other systems reviewed and are negative.  Allergies  Review of patient's allergies indicates no known allergies.  Home Medications   Prior to Admission medications   Medication Sig Start Date End Date Taking? Authorizing  Provider  hydrALAZINE (APRESOLINE) 25 MG tablet Take 1 tablet (25 mg total) by mouth 3 (three) times daily. 08/09/13   Theodis Blaze, MD  pantoprazole (PROTONIX) 40 MG tablet Take 1 tablet (40 mg total) by mouth 2 (two) times daily. 08/09/13   Theodis Blaze, MD  zolpidem (AMBIEN) 10 MG tablet Take 1 tablet (10 mg total) by mouth at bedtime as needed for sleep. 08/09/13 09/08/13  Theodis Blaze, MD   BP 157/112  Pulse 96  Temp(Src) 97.2 F (36.2 C) (Oral)  SpO2 100%  Physical Exam  Nursing note and vitals reviewed. Constitutional: She is oriented to person, place, and time. She appears well-developed and well-nourished. No distress.  HENT:  Head: Normocephalic and atraumatic.  Eyes: EOM are normal.  Neck: Neck supple. No tracheal deviation present.  Cardiovascular: Normal rate.   Pulmonary/Chest: Effort normal. No respiratory distress.  Musculoskeletal: Normal range of motion.  Neurological: She is alert and oriented to person, place, and time.  Skin: Skin is warm and dry.  Scattered papules to flexor surfaces of arms, bilateral wrists, generalized torso and back and generalized legs with overlying excoriations.   Psychiatric: She has a normal mood and affect. Her behavior is normal.    ED Course  Procedures (including critical care time)  DIAGNOSTIC STUDIES: Oxygen Saturation  is 100% on RA, normal by my interpretation.    COORDINATION OF CARE: 9:56 AM-Discussed treatment plan which includes atarax and prometherin cream with pt at bedside and pt agreed to plan.   Labs Review Labs Reviewed - No data to display  Imaging Review No results found.   EKG Interpretation None      MDM   Final diagnoses:  Rash    10:01 AM Patient will be treated for bed bugs with permethrin cream given her history. Patient will have atarax as needed itching. Vitals stable and patient afebrile. Patient instructed to return to the ED with worsening or concerning symptoms.   I personally  performed the services described in this documentation, which was scribed in my presence. The recorded information has been reviewed and is accurate.  Alvina Chou, PA-C 09/13/13 1003

## 2013-09-14 ENCOUNTER — Ambulatory Visit: Payer: Self-pay

## 2013-09-14 NOTE — ED Provider Notes (Signed)
Medical screening examination/treatment/procedure(s) were performed by non-physician practitioner and as supervising physician I was immediately available for consultation/collaboration.   EKG Interpretation None        Alfonzo Feller, DO 09/14/13 [phone]

## 2013-09-26 ENCOUNTER — Ambulatory Visit: Payer: Self-pay

## 2013-11-26 ENCOUNTER — Inpatient Hospital Stay (HOSPITAL_COMMUNITY): Payer: Medicaid Other

## 2013-11-26 ENCOUNTER — Emergency Department (HOSPITAL_COMMUNITY): Payer: Medicaid Other

## 2013-11-26 ENCOUNTER — Inpatient Hospital Stay (HOSPITAL_COMMUNITY)
Admission: EM | Admit: 2013-11-26 | Discharge: 2013-12-07 | DRG: 673 | Disposition: A | Discharge status: Home / Self Care | Payer: Medicaid Other | Attending: Internal Medicine | Admitting: Internal Medicine

## 2013-11-26 ENCOUNTER — Encounter (HOSPITAL_COMMUNITY): Payer: Self-pay | Admitting: Emergency Medicine

## 2013-11-26 DIAGNOSIS — R7401 Elevation of levels of liver transaminase levels: Secondary | ICD-10-CM

## 2013-11-26 DIAGNOSIS — E872 Acidosis, unspecified: Secondary | ICD-10-CM | POA: Diagnosis present

## 2013-11-26 DIAGNOSIS — K219 Gastro-esophageal reflux disease without esophagitis: Secondary | ICD-10-CM | POA: Diagnosis present

## 2013-11-26 DIAGNOSIS — N2581 Secondary hyperparathyroidism of renal origin: Secondary | ICD-10-CM | POA: Diagnosis present

## 2013-11-26 DIAGNOSIS — D649 Anemia, unspecified: Secondary | ICD-10-CM

## 2013-11-26 DIAGNOSIS — Z79899 Other long term (current) drug therapy: Secondary | ICD-10-CM

## 2013-11-26 DIAGNOSIS — D631 Anemia in chronic kidney disease: Secondary | ICD-10-CM | POA: Diagnosis present

## 2013-11-26 DIAGNOSIS — F17200 Nicotine dependence, unspecified, uncomplicated: Secondary | ICD-10-CM | POA: Diagnosis present

## 2013-11-26 DIAGNOSIS — K254 Chronic or unspecified gastric ulcer with hemorrhage: Secondary | ICD-10-CM | POA: Diagnosis present

## 2013-11-26 DIAGNOSIS — R630 Anorexia: Secondary | ICD-10-CM | POA: Diagnosis present

## 2013-11-26 DIAGNOSIS — D696 Thrombocytopenia, unspecified: Secondary | ICD-10-CM | POA: Diagnosis present

## 2013-11-26 DIAGNOSIS — R1110 Vomiting, unspecified: Secondary | ICD-10-CM

## 2013-11-26 DIAGNOSIS — E440 Moderate protein-calorie malnutrition: Secondary | ICD-10-CM | POA: Diagnosis present

## 2013-11-26 DIAGNOSIS — I10 Essential (primary) hypertension: Secondary | ICD-10-CM | POA: Diagnosis present

## 2013-11-26 DIAGNOSIS — E8729 Other acidosis: Secondary | ICD-10-CM

## 2013-11-26 DIAGNOSIS — K7460 Unspecified cirrhosis of liver: Secondary | ICD-10-CM | POA: Diagnosis present

## 2013-11-26 DIAGNOSIS — N039 Chronic nephritic syndrome with unspecified morphologic changes: Secondary | ICD-10-CM | POA: Diagnosis present

## 2013-11-26 DIAGNOSIS — N179 Acute kidney failure, unspecified: Principal | ICD-10-CM | POA: Diagnosis present

## 2013-11-26 DIAGNOSIS — F1210 Cannabis abuse, uncomplicated: Secondary | ICD-10-CM | POA: Diagnosis present

## 2013-11-26 DIAGNOSIS — N390 Urinary tract infection, site not specified: Secondary | ICD-10-CM

## 2013-11-26 DIAGNOSIS — N19 Unspecified kidney failure: Secondary | ICD-10-CM

## 2013-11-26 DIAGNOSIS — K5900 Constipation, unspecified: Secondary | ICD-10-CM | POA: Diagnosis not present

## 2013-11-26 DIAGNOSIS — F1410 Cocaine abuse, uncomplicated: Secondary | ICD-10-CM | POA: Diagnosis present

## 2013-11-26 DIAGNOSIS — K257 Chronic gastric ulcer without hemorrhage or perforation: Secondary | ICD-10-CM

## 2013-11-26 DIAGNOSIS — I120 Hypertensive chronic kidney disease with stage 5 chronic kidney disease or end stage renal disease: Secondary | ICD-10-CM | POA: Diagnosis present

## 2013-11-26 DIAGNOSIS — N186 End stage renal disease: Secondary | ICD-10-CM | POA: Diagnosis present

## 2013-11-26 DIAGNOSIS — B1920 Unspecified viral hepatitis C without hepatic coma: Secondary | ICD-10-CM | POA: Diagnosis present

## 2013-11-26 DIAGNOSIS — E8779 Other fluid overload: Secondary | ICD-10-CM | POA: Diagnosis present

## 2013-11-26 DIAGNOSIS — IMO0002 Reserved for concepts with insufficient information to code with codable children: Secondary | ICD-10-CM | POA: Diagnosis not present

## 2013-11-26 DIAGNOSIS — R000 Tachycardia, unspecified: Secondary | ICD-10-CM | POA: Diagnosis not present

## 2013-11-26 DIAGNOSIS — R21 Rash and other nonspecific skin eruption: Secondary | ICD-10-CM | POA: Diagnosis present

## 2013-11-26 DIAGNOSIS — R768 Other specified abnormal immunological findings in serum: Secondary | ICD-10-CM | POA: Diagnosis present

## 2013-11-26 DIAGNOSIS — J438 Other emphysema: Secondary | ICD-10-CM | POA: Diagnosis present

## 2013-11-26 DIAGNOSIS — R197 Diarrhea, unspecified: Secondary | ICD-10-CM

## 2013-11-26 DIAGNOSIS — R740 Nonspecific elevation of levels of transaminase and lactic acid dehydrogenase [LDH]: Secondary | ICD-10-CM

## 2013-11-26 DIAGNOSIS — D638 Anemia in other chronic diseases classified elsewhere: Secondary | ICD-10-CM | POA: Diagnosis present

## 2013-11-26 DIAGNOSIS — K922 Gastrointestinal hemorrhage, unspecified: Secondary | ICD-10-CM

## 2013-11-26 LAB — CBC
HCT: 20.8 % — ABNORMAL LOW (ref 36.0–46.0)
Hemoglobin: 7.3 g/dL — ABNORMAL LOW (ref 12.0–15.0)
MCH: 30.9 pg (ref 26.0–34.0)
MCHC: 35.1 g/dL (ref 30.0–36.0)
MCV: 88.1 fL (ref 78.0–100.0)
Platelets: 205 10*3/uL (ref 150–400)
RBC: 2.36 MIL/uL — ABNORMAL LOW (ref 3.87–5.11)
RDW: 14.5 % (ref 11.5–15.5)
WBC: 9.7 10*3/uL (ref 4.0–10.5)

## 2013-11-26 LAB — HEPATIC FUNCTION PANEL
ALT: 16 U/L (ref 0–35)
AST: 19 U/L (ref 0–37)
Albumin: 2.5 g/dL — ABNORMAL LOW (ref 3.5–5.2)
Alkaline Phosphatase: 81 U/L (ref 39–117)
Bilirubin, Direct: <0.2 mg/dL (ref 0.0–0.3)
Total Bilirubin: 0.2 mg/dL — ABNORMAL LOW (ref 0.3–1.2)
Total Protein: 6.8 g/dL (ref 6.0–8.3)

## 2013-11-26 LAB — I-STAT ARTERIAL BLOOD GAS, ED
Acid-base deficit: 17 mmol/L — ABNORMAL HIGH (ref 0.0–2.0)
Bicarbonate: 8.9 mEq/L — ABNORMAL LOW (ref 20.0–24.0)
O2 Saturation: 98 %
Patient temperature: 97.6
TCO2: 10 mmol/L (ref 0–100)
pCO2 arterial: 20.2 mmHg — ABNORMAL LOW (ref 35.0–45.0)
pH, Arterial: 7.25 — ABNORMAL LOW (ref 7.350–7.450)
pO2, Arterial: 112 mmHg — ABNORMAL HIGH (ref 80.0–100.0)

## 2013-11-26 LAB — PRO B NATRIURETIC PEPTIDE: Pro B Natriuretic peptide (BNP): 11948 pg/mL — ABNORMAL HIGH (ref 0–125)

## 2013-11-26 LAB — BASIC METABOLIC PANEL
BUN: 82 mg/dL — ABNORMAL HIGH (ref 6–23)
CO2: 9 mEq/L — CL (ref 19–32)
Calcium: 7.1 mg/dL — ABNORMAL LOW (ref 8.4–10.5)
Chloride: 104 mEq/L (ref 96–112)
Creatinine, Ser: 14.19 mg/dL — ABNORMAL HIGH (ref 0.50–1.10)
GFR calc Af Amer: 3 mL/min — ABNORMAL LOW (ref >90)
GFR calc non Af Amer: 3 mL/min — ABNORMAL LOW (ref >90)
Glucose, Bld: 78 mg/dL (ref 70–99)
Potassium: 4 mEq/L (ref 3.7–5.3)
Sodium: 138 mEq/L (ref 137–147)

## 2013-11-26 LAB — SALICYLATE LEVEL: Salicylate Lvl: <2 mg/dL — ABNORMAL LOW (ref 2.8–20.0)

## 2013-11-26 LAB — I-STAT TROPONIN, ED: Troponin i, poc: 0 ng/mL (ref 0.00–0.08)

## 2013-11-26 LAB — URINE MICROSCOPIC-ADD ON

## 2013-11-26 LAB — RAPID URINE DRUG SCREEN, HOSP PERFORMED
Amphetamines: NOT DETECTED
Barbiturates: NOT DETECTED
Benzodiazepines: NOT DETECTED
Cocaine: NOT DETECTED
Opiates: NOT DETECTED
Tetrahydrocannabinol: POSITIVE — AB

## 2013-11-26 LAB — SEDIMENTATION RATE: Sed Rate: 74 mm/hr — ABNORMAL HIGH (ref 0–22)

## 2013-11-26 LAB — URINALYSIS, ROUTINE W REFLEX MICROSCOPIC
Bilirubin Urine: NEGATIVE
Glucose, UA: NEGATIVE mg/dL
Ketones, ur: NEGATIVE mg/dL
Nitrite: POSITIVE — AB
Protein, ur: >300 mg/dL — AB
Specific Gravity, Urine: 1.021 (ref 1.005–1.030)
Urobilinogen, UA: 0.2 mg/dL (ref 0.0–1.0)
pH: 5.5 (ref 5.0–8.0)

## 2013-11-26 LAB — TSH: TSH: 1.9 u[IU]/mL (ref 0.350–4.500)

## 2013-11-26 LAB — GLUCOSE, CAPILLARY: Glucose-Capillary: 93 mg/dL (ref 70–99)

## 2013-11-26 LAB — TROPONIN I: Troponin I: <0.3 ng/mL (ref <0.30)

## 2013-11-26 LAB — LACTIC ACID, PLASMA: Lactic Acid, Venous: 0.5 mmol/L (ref 0.5–2.2)

## 2013-11-26 MED ORDER — HYDRALAZINE HCL 25 MG PO TABS
25.0000 mg | ORAL_TABLET | Freq: Three times a day (TID) | ORAL | Status: DC
  Administered 2013-11-26 – 2013-11-28 (×5): 25 mg via ORAL
  Filled 2013-11-26 (×10): qty 1

## 2013-11-26 MED ORDER — PANTOPRAZOLE SODIUM 40 MG IV SOLR
40.0000 mg | Freq: Two times a day (BID) | INTRAVENOUS | Status: DC
  Administered 2013-11-26 – 2013-11-29 (×7): 40 mg via INTRAVENOUS
  Filled 2013-11-26 (×11): qty 40

## 2013-11-26 MED ORDER — HYDROXYZINE HCL 10 MG PO TABS
10.0000 mg | ORAL_TABLET | ORAL | Status: DC | PRN
  Administered 2013-11-26 – 2013-11-28 (×3): 10 mg via ORAL
  Filled 2013-11-26 (×3): qty 1

## 2013-11-26 MED ORDER — PANTOPRAZOLE SODIUM 40 MG PO TBEC
40.0000 mg | DELAYED_RELEASE_TABLET | Freq: Two times a day (BID) | ORAL | Status: DC
Start: 2013-11-26 — End: 2013-11-26

## 2013-11-26 MED ORDER — SODIUM CHLORIDE 0.9 % IV BOLUS (SEPSIS)
1000.0000 mL | Freq: Once | INTRAVENOUS | Status: AC
  Administered 2013-11-26: 1000 mL via INTRAVENOUS

## 2013-11-26 MED ORDER — SODIUM CHLORIDE 0.9 % IJ SOLN
3.0000 mL | Freq: Two times a day (BID) | INTRAMUSCULAR | Status: DC
  Administered 2013-11-27 – 2013-12-06 (×16): 3 mL via INTRAVENOUS

## 2013-11-26 MED ORDER — ZOLPIDEM TARTRATE 5 MG PO TABS
10.0000 mg | ORAL_TABLET | Freq: Every evening | ORAL | Status: DC | PRN
  Administered 2013-11-26 – 2013-12-05 (×7): 10 mg via ORAL
  Filled 2013-11-26 (×7): qty 2

## 2013-11-26 MED ORDER — METHYLPREDNISOLONE SODIUM SUCC 125 MG IJ SOLR
125.0000 mg | Freq: Two times a day (BID) | INTRAMUSCULAR | Status: DC
  Administered 2013-11-26: 125 mg via INTRAVENOUS
  Filled 2013-11-26 (×4): qty 2

## 2013-11-26 NOTE — H&P (Addendum)
Hospitalist Admission History and Physical  Patient name: Karen Morrow Medical record number: [id number] Date of birth: 1960-08-13 Age: 53 y.o. Gender: female  Primary Care Provider: No PCP Per Patient  Chief Complaint: AKI, diarrhea, dyspnea, swelling, anemia   History of Present Illness:This is a 53 y.o. year old femaleemale with known prior history of upper GI bleeding, gastric ulcer, chronic kidney disease w/ baseline Cr around 2, tobacco abuse, remote history of drug abuse presenting with acute kidney injury, and I doubt metabolic acidosis, diarrhea, dyspnea, swelling, anemia. Patient states she still generally sick over the past 2 or 3 months. However over the past month she's had progressive diarrhea as well as dyspnea. Has also developed a diffuse pruritic rash over the skin. Has been using Benadryl for this with minimal to mild improvement. Denies any fevers or chills. Patient states she has at least 4-5 bowel movements per day that are runny. Sometimes melanotic. Noted to have been dated back in January for an upper GI bleed associated with NSAID use. Patient denies taking any ibuprofen, Motrin, Aleve, ASA,Goody powders. Remote history of cocaine abuse greater than 15 years ago. Patient denies using. States she's had progressive dyspnea on exertion with wheezing. No chest pains or diaphoresis. Has also noticed worsening lower extremity swelling. Is still making urine. On presentation to the ER, hemodynamically stable. Blood pressures in the 90s to 160s. 7 GERD 100% on room air. Heart rate in the 90s to 130s, but mainly in the 90s. White blood cell count 9.7, hemoglobin 7.3, creatinine 14.19, BUN 82, bicarbonate of 9, calcium of 7. ABG pending. Hemoccult pending. Chest x-ray does not show any acute infiltrate but does show borderline heart size as well as COPD and emphysematous changes. Renal consult pending.  Assessment and Plan: Karen Morrow is a 53 y.o. year old femaleemale presenting with  acute kidney injury, metabolic acidosis, dyspnea, anemia, rash  AKI: Noted markedly elevated creatinine with metabolic acidosis. Suspect progression of CKD. ? Prerenal etiology in setting of diarrhea. Renal consult pending. Does seem to be mildly dry on presentation. Defer IV fluids and IV bicarbonate to renal recommendations. Renal ultrasound. Followup on nephrology recommendations.  Metabolic acidosis: Anion gap of around 25 on presentation. Suspect uremic acidosis in the setting of #1. Check lactate salicylate level. Check alcohol level. ABG pending in the setting of COPD. May benefit from bicarbonate. Defer to renal. Continue to follow.  Dyspnea: Depression diagnosis includes cardiopulmonary and renal sources. Note baseline renal insufficiency. Also with COPD this likely undiagnosed. Check 2-D echo. Does not seem to be clinically volume overloaded on presentation. Start on IV Solu-Medrol. When necessary albuterol. Satting well on room air currently.  Anemia: Suspect acute blood loss anemia in the setting of subjective melanotic diarrhea and history of upper GI bleeding. We'll type and screen patient. Step down bed. Avoid anticoagulation and antiplatelet. High dose PPI. GI consult in the morning.  Rash: Fairly broad differential her symptoms in the setting of current presentation. Differential diagnosis includes infectious and autoimmune sources. Check sedimentation rate. IV Solu-Medrol. Should help with pruritic component. When necessary Atarax.  Diarrhea: History of GI bleeding. Hemoccult pending. Check stool studies including C. difficile, stool culture, fecal lactoferrin, ova and parasites. No leukocytosis or fever on presentation. Followup one Hemoccult. If negative consider start IV Levaquin and IV Flagyl.   Hypertension: Poorly controlled outpatient. Continue outpatient hydralazine. Titrate as clinically indicated.  FEN/GI: N.p.o. after midnight Prophylaxis: SCDs Disposition: pendnig  further evaluation  Code Status:Full Code  Patient Active Problem List   Diagnosis Date Noted  . HTN (hypertension) 11/26/2013  . AKI (acute kidney injury) 11/26/2013  . Gastric ulcer 06/07/2013  . Upper GI bleed 11/04/2012  . Anemia 11/03/2012  . Thrombocytopenia 11/03/2012  . UTI (lower urinary tract infection) 11/03/2012  . Elevated transaminase level 11/03/2012  . 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 Hepatitis C antibody test positive    Past Medical History: Past Medical History  Diagnosis Date  . Asthma   . Hepatitis C antibody test positive   . Hypertension     "just dx'd today" (11/03/2012)  . Anginal pain     Past Surgical History: Past Surgical History  Procedure Laterality Date  . Abdominal hysterectomy      "partial" (11/03/2012)  . Esophagogastroduodenoscopy N/A 06/05/2013    Procedure: ESOPHAGOGASTRODUODENOSCOPY (EGD);  Surgeon: Winfield Cunas., MD;  Location: [facility];  Service: Endoscopy;  Laterality: N/A;    Social History: History   Social History  . Marital Status: Married    Spouse Name: N/A    Number of Children: N/A  . Years of Education: N/A   Social History Main Topics  . Smoking status: Current Every Day Smoker -- 0.50 packs/day for 20 years    Types: Cigarettes  . Smokeless tobacco: Never Used  . Alcohol Use: 27.6 oz/week    46 Cans of beer per week     Comment: 11/03/2012 "1-2 40oz beers/day maybe"  . Drug Use: Yes    Special: Marijuana     Comment: 11/03/2012 "maybe once or twice/wk"  . Sexual Activity: Not Currently   Other Topics Concern  . None   Social History Narrative  . None    Family History: History reviewed. No pertinent family history.  Allergies: No Known Allergies  Current Facility-Administered Medications  Medication Dose Route Frequency Provider Last Rate Last Dose  . hydrALAZINE (APRESOLINE) tablet 25 mg  25 mg Oral TID Shanda Howells, MD      .  methylPREDNISolone sodium succinate (SOLU-MEDROL) 125 mg/2 mL injection 125 mg  125 mg Intravenous Q12H Shanda Howells, MD      . pantoprazole (PROTONIX) EC tablet 40 mg  40 mg Oral BID Shanda Howells, MD      . sodium chloride 0.9 % bolus 1,000 mL  1,000 mL Intravenous Once Kristen N Ward, DO      . sodium chloride 0.9 % injection 3 mL  3 mL Intravenous Q12H Shanda Howells, MD      . zolpidem (AMBIEN) tablet 10 mg  10 mg Oral QHS PRN Shanda Howells, MD       Current Outpatient Prescriptions  Medication Sig Dispense Refill  . diphenhydrAMINE (SOMINEX) 25 MG tablet Take 25 mg by mouth at bedtime as needed for itching or sleep.      . hydrALAZINE (APRESOLINE) 25 MG tablet Take 1 tablet (25 mg total) by mouth 3 (three) times daily.  90 tablet  5  . pantoprazole (PROTONIX) 40 MG tablet Take 1 tablet (40 mg total) by mouth 2 (two) times daily.  60 tablet  5  . zolpidem (AMBIEN) 10 MG tablet Take 1 tablet (10 mg total) by mouth at bedtime as needed for sleep.  30 tablet  3   Review Of Systems: 12 point ROS negative except as noted above in HPI.  Physical Exam: Filed Vitals:   11/26/13 1745  BP: 95/53  Pulse: 96  Temp:   Resp: 15  General: alert and cooperative HEENT: PERRLA and extra ocular movement intact Heart: S1, S2 normal, no murmur, rub or gallop, regular rate and rhythm Lungs: clear to auscultation, no wheezes or rales and unlabored breathing Abdomen: abdomen is soft without significant tenderness, masses, organomegaly or guarding Extremities: 2+ peripheral pulses, trace edema bilaterally Skin: Positive generalized excoriations on skin diffusely Neurology: normal without focal findings  Labs and Imaging: Lab Results  Component Value Date/Time   NA 138 11/26/2013  2:52 PM   K 4.0 11/26/2013  2:52 PM   CL 104 11/26/2013  2:52 PM   CO2 9* 11/26/2013  2:52 PM   BUN 82* 11/26/2013  2:52 PM   CREATININE 14.19* 11/26/2013  2:52 PM   CREATININE 3.61* 08/09/2013  3:28 PM   GLUCOSE 78  11/26/2013  2:52 PM   Lab Results  Component Value Date   WBC 9.7 11/26/2013   HGB 7.3* 11/26/2013   HCT 20.8* 11/26/2013   MCV 88.1 11/26/2013   PLT 205 11/26/2013    Dg Chest 2 View  11/26/2013   CLINICAL DATA:  Two week history of shortness of breath, rash, and swelling of the legs and face. Smoker with current history of hypertension.  EXAM: CHEST  2 VIEW  COMPARISON:  11/03/2012.  FINDINGS: Cardiac silhouette upper normal in size to slightly enlarged. Hilar and mediastinal contours otherwise unremarkable. Moderate to marked hyperinflation, more so than on the prior examination, now with likely air cysts or blebs in the right middle lobe. Lungs clear. Bronchovascular markings normal. Pulmonary vascularity normal. No visible pleural effusions. No pneumothorax. Visualized bony thorax intact. Nipple shadows account for apparent nodular opacities in the lung bases on the AP view.  IMPRESSION: Borderline heart size. COPD/emphysema. No acute cardiopulmonary disease.   Electronically Signed   By: Evangeline Dakin M.D.   On: 11/26/2013 15:32           Shanda Howells MD  Pager: [phone]

## 2013-11-26 NOTE — ED Notes (Signed)
Water given to pt per K. Ward

## 2013-11-26 NOTE — ED Provider Notes (Signed)
TIME SEEN: 5:30 PM  CHIEF COMPLAINT: Swelling, shortness of breath, vomiting and diarrhea  HPI: Patient is a 54 y.o. F with history of CKD on on HD, hypertension does poorly controlled on hydralazine, asthma, hepatitis C who presents to the emergency department with 2 weeks of intermittent episodes of vomiting and diarrhea. She states that she is also had swelling in her legs and face for the past several days. She states she's also had a diffuse rash that itches for several days. No new exposures. No fevers, chest pain. She has had shortness of breath with exertion. No abdominal pain. She denies any bloody stool or melena. No vaginal bleeding.  She states she does not have a primary care physician and that she gets her medications refilled by urgent care.  ROS: See HPI Constitutional: no fever  Eyes: no drainage  ENT: no runny nose   Cardiovascular:  no chest pain  Resp:  SOB  GI:  vomiting GU: no dysuria Integumentary: no rash  Allergy: no hives  Musculoskeletal: no leg swelling  Neurological: no slurred speech ROS otherwise negative  PAST MEDICAL HISTORY/PAST SURGICAL HISTORY:  Past Medical History  Diagnosis Date  . Asthma   . Hepatitis C antibody test positive   . Hypertension     "just dx'd today" (11/03/2012)  . Anginal pain     MEDICATIONS:  Prior to Admission medications   Medication Sig Start Date End Date Taking? Authorizing Provider  diphenhydrAMINE (SOMINEX) 25 MG tablet Take 25 mg by mouth at bedtime as needed for itching or sleep.   Yes Historical Provider, MD  hydrALAZINE (APRESOLINE) 25 MG tablet Take 1 tablet (25 mg total) by mouth 3 (three) times daily. 08/09/13  Yes Theodis Blaze, MD  pantoprazole (PROTONIX) 40 MG tablet Take 1 tablet (40 mg total) by mouth 2 (two) times daily. 08/09/13  Yes Theodis Blaze, MD  zolpidem (AMBIEN) 10 MG tablet Take 1 tablet (10 mg total) by mouth at bedtime as needed for sleep. 08/09/13 09/08/13  Theodis Blaze, MD    ALLERGIES:   No Known Allergies  SOCIAL HISTORY:  History  Substance Use Topics  . Smoking status: Current Every Day Smoker -- 0.50 packs/day for 20 years    Types: Cigarettes  . Smokeless tobacco: Never Used  . Alcohol Use: 27.6 oz/week    46 Cans of beer per week     Comment: 11/03/2012 "1-2 40oz beers/day maybe"    FAMILY HISTORY: History reviewed. No pertinent family history.  EXAM: BP 167/114  Pulse 130  Temp(Src) 97.6 F (36.4 C) (Oral)  Resp 18  Ht 5\' 7"  (1.702 m)  SpO2 100% CONSTITUTIONAL: Alert and oriented and responds appropriately to questions. Well-appearing; well-nourished, patient is nontoxic appearing and in no distress HEAD: Normocephalic EYES: Conjunctivae clear, PERRL ENT: normal nose; no rhinorrhea; dry mucous membranes; pharynx without lesions noted NECK: Supple, no meningismus, no LAD  CARD: Regular and tachycardic; S1 and S2 appreciated; no murmurs, no clicks, no rubs, no gallops RESP: Normal chest excursion without splinting or tachypnea; breath sounds clear and equal bilaterally; no wheezes, no rhonchi, no rales, no hypoxia or respiratory distress ABD/GI: Normal bowel sounds; non-distended; soft, non-tender, no rebound, no guarding BACK:  The back appears normal and is non-tender to palpation, there is no CVA tenderness EXT: Normal ROM in all joints; non-tender to palpation; no edema; normal capillary refill; no cyanosis    SKIN: Normal color for age and race; warm, diffuse macular rash is not  erythematous or warm and without drainage to her extremities and trunk NEURO: Moves all extremities equally sensation to light touch intact diffusely, cranial nerves II through XII intact PSYCH: The patient's mood and manner are appropriate. Grooming and personal hygiene are appropriate.  MEDICAL DECISION MAKING: Patient here with vomiting and diarrhea for the past 2 weeks and now with swelling and signs of acute renal failure. Her creatinine is normally between 2 and 3. She  does not have a nephrologist. Today her creatinine is greater than 14 she is uremic. Her uremia is causing a anion gap metabolic acidosis. Discussed with Dr. Jonnie Finner with nephrology who will see the patient in consult. He recommends giving patient IV fluids given her history of vomiting, diarrhea and tachycardia. Discussed with hospitalist service for admission. She does not have a primary care physician.  Chest x-ray is clear. No hypoxia. No signs of volume overload currently. She still states she is making urine normally.  Given her history of diarrhea, will obtain C diff PCR, stool cultures. We'll also obtain Hemoccult given her anemia but this is likely due to renal failure.  CRITICAL CARE Performed by: Nyra Jabs   Total critical care time: 35 minutes  Critical care time was exclusive of separately billable procedures and treating other patients.  Critical care was necessary to treat or prevent imminent or life-threatening deterioration.  Critical care was time spent personally by me on the following activities: development of treatment plan with patient and/or surrogate as well as nursing, discussions with consultants, evaluation of patient's response to treatment, examination of patient, obtaining history from patient or surrogate, ordering and performing treatments and interventions, ordering and review of laboratory studies, ordering and review of radiographic studies, pulse oximetry and re-evaluation of patient's condition.       EKG Interpretation  Date/Time:  Monday November 26 2013 14:43:08 EDT Ventricular Rate:  124 PR Interval:    QRS Duration: 76 QT Interval:  336 QTC Calculation: 482 R Axis:   31 Text Interpretation:  Sinus tachycardia Moderate voltage criteria for LVH, may be normal variant Borderline ECG Confirmed by WARD,  DO, KRISTEN [id number]) on 11/26/2013 5:30:38 PM          [place], DO 11/26/13 2313

## 2013-11-26 NOTE — Consult Note (Addendum)
Renal Service Consult Note [facility]  Karen Morrow 11/26/2013 [place] Requesting Physician:  Dr Leonides Schanz  Reason for Consult:  Acute on CRF HPI: The patient is a 53 y.o. year-old patient with hx of CKD, hep C and HTN. She presents with progressive anorexia, loss of taste, leg and facial swelling, loss of energy, cold "all the time" and diarrhea over the past several months.  She went to an urgent care recently and the ER once but does not have insurance or a PCP and otherwise hasn't seen a doctor in "a long time".   She says she knows that something is wrong but was afraid to find out.  In ED today creat is 14. In 2011 it was 0.72.  In June last year is was 2.5, in Jan this year creat was 2.96 and in March creat was up to 3.61.  She denies use of NSAID's. On BP meds for about a year, taking hydralazine.  Has never seen a kidney specialist.   Date  Creat    eGFR  CKD 2011  0.7- 0.9  >60 June 2014 2.29- 2.69  19- 21  Stage IV Jan 2015 2.73- 2.96   17- 19  Stage IV Mar 2015 3.61   Today  14.19   3  Stage V  Chart review: Jun 16-19, 2011-- admitted with fever, chills and R flank pain.  US showed swollen R kidney, EColi grew in urine and bc's were negative.  Treated with Rocephin then po Cipro.  AST/ALT were up and platelets low-- hepatitis panel showed +hep C with high viral load. Pt was d/c'd before hep C result came back and primary MD was going to contact pt to let her know.  Jun 6-8, 2014-- admitted with abd pain x a few days.  CT abd showed liver suggestive of cirrhosis, and thickening of stomach wall suggesting gastritis or PUD.   Rx'd w PPI and Carafate.  She had melena also which improved.  Not seen by GI, but OP GI appt was made. Ecoli UTI treated with abx.  Low plts d/t cirrhosis vs acute illness Jan 6-8, 2015-- hematemesis from nsaids and or portal gastropathy from hep- C related cirrhosis. Seen by GI and quant hep C testing was done. EGD showed gastric ulcer,  bleeding stopped. Acute vs CKD with creat 2.9  ROS  no voiding problems or hematuria  no fevers  emesis last 1 week  no abd pain  no jt pain  rash upper chest and back  Past Medical History  Past Medical History  Diagnosis Date  . Asthma   . Hepatitis C antibody test positive   . Hypertension     "just dx'd today" (11/03/2012)  . Anginal pain    Past Surgical History  Past Surgical History  Procedure Laterality Date  . Abdominal hysterectomy      "partial" (11/03/2012)  . Esophagogastroduodenoscopy N/A 06/05/2013    Procedure: ESOPHAGOGASTRODUODENOSCOPY (EGD);  Surgeon: Winfield Cunas., MD;  Location: [facility];  Service: Endoscopy;  Laterality: N/A;   Family History History reviewed. No pertinent family history. Social History  reports that she has been smoking Cigarettes.  She has a 10 pack-year smoking history. She has never used smokeless tobacco. She reports that she drinks about 27.6 ounces of alcohol per week. She reports that she uses illicit drugs (Marijuana). Allergies No Known Allergies Home medications Prior to Admission medications   Medication Sig Start Date End Date Taking? Authorizing Randale Carvalho  diphenhydrAMINE (  SOMINEX) 25 MG tablet Take 25 mg by mouth at bedtime as needed for itching or sleep.   Yes Historical Lexton Hidalgo, MD  hydrALAZINE (APRESOLINE) 25 MG tablet Take 1 tablet (25 mg total) by mouth 3 (three) times daily. 08/09/13  Yes Theodis Blaze, MD  pantoprazole (PROTONIX) 40 MG tablet Take 1 tablet (40 mg total) by mouth 2 (two) times daily. 08/09/13  Yes Theodis Blaze, MD  zolpidem (AMBIEN) 10 MG tablet Take 1 tablet (10 mg total) by mouth at bedtime as needed for sleep. 08/09/13 09/08/13  Theodis Blaze, MD   Liver Function Tests No results found for this basename: AST, ALT, ALKPHOS, BILITOT, PROT, ALBUMIN,  in the last 168 hours No results found for this basename: LIPASE, AMYLASE,  in the last 168 hours CBC  Recent Labs Lab 11/26/13 1452  WBC 9.7   HGB 7.3*  HCT 20.8*  MCV 88.1  PLT 233   Basic Metabolic Panel  Recent Labs Lab 11/26/13 1452  NA 138  K 4.0  CL 104  CO2 9*  GLUCOSE 78  BUN 82*  CREATININE 14.19*  CALCIUM 7.1*    Filed Vitals:   11/26/13 1439 11/26/13 1745  BP: 167/114 95/53  Pulse: 130 96  Temp: 97.6 F (36.4 C)   TempSrc: Oral   Resp: 18 15  Height: '5\' 7"'  (1.702 m)   SpO2: 100% 100%   Exam Thin AAF no distress No rash, cyanosis or gangrene Sclera anicteric, throat clear Mild JVD Chest fine basilar crackles 1/3 up on R, L mostly clear RRR 2/6 SEM RUSB Abd soft, NTND, no mass, no ascites Ext 1-2+ pitting pretib edema bilat, symmetrical Neuro is nf, Ox 3  UA  >300 protein, tntc WBC, 3-6 rbc, many bact CXR COPD , no acute disease  Assessment: 1 CKD stage V- pt has end-stage renal failure with severe uremic symptoms for several months.  I doubt very much she has any recoverable renal function.  She was at stage IV CKD last summer and in Jan this year.  She may have hep C related renal failure, she had very active hep C disease when tested this year in January and has not had any Rx for this that I can see.  Will get a renal US in the morning. She is volume overloaded and BP is high and no neprhrotoxins , ACEi , nsaid's are implicated.  I think this is ESRD.  Will prob need HD in the next day or two.  I have d/w patient who agrees to whatever needs to be done.   2 HTN on hydralazine x 1 year 3 Hep C diagnosed in 2011 4 Hx GIB x 2 (2014, Jan 2015) 5 Tobacco / COPD by cxr 6 Volume excess 7 Diarrhea- suspect this is uremic GI dysfunction 8 Metabolic acidosis- she is vol overloaded so would avoid bicarb IVF's for now, this is from advance CKD and will prob need HD to correct, she is tolerating it well though for now   Plan- renal US in am, if no obstruction will order placement of tunneled HD cath for initiation of HD.    Kelly Splinter MD (pgr) [phone]    ([phone] 11/26/2013, 6:08  PM

## 2013-11-26 NOTE — ED Notes (Signed)
Pt has multiple complaints. Reports bilateral ankle swelling, numbness sensation to both legs for extended amount of time. Having frequent stools and no appetite, swelling to face. Also has itching and rash to body.

## 2013-11-27 ENCOUNTER — Inpatient Hospital Stay (HOSPITAL_COMMUNITY): Payer: Medicaid Other

## 2013-11-27 ENCOUNTER — Encounter (HOSPITAL_COMMUNITY): Payer: Self-pay | Admitting: Radiology

## 2013-11-27 DIAGNOSIS — E872 Acidosis, unspecified: Secondary | ICD-10-CM

## 2013-11-27 DIAGNOSIS — D696 Thrombocytopenia, unspecified: Secondary | ICD-10-CM

## 2013-11-27 DIAGNOSIS — R894 Abnormal immunological findings in specimens from other organs, systems and tissues: Secondary | ICD-10-CM

## 2013-11-27 DIAGNOSIS — K7460 Unspecified cirrhosis of liver: Secondary | ICD-10-CM

## 2013-11-27 DIAGNOSIS — D638 Anemia in other chronic diseases classified elsewhere: Secondary | ICD-10-CM

## 2013-11-27 DIAGNOSIS — I10 Essential (primary) hypertension: Secondary | ICD-10-CM

## 2013-11-27 DIAGNOSIS — I517 Cardiomegaly: Secondary | ICD-10-CM

## 2013-11-27 LAB — CBC
HCT: 19.6 % — ABNORMAL LOW (ref 36.0–46.0)
HCT: 20.9 % — ABNORMAL LOW (ref 36.0–46.0)
Hemoglobin: 6.9 g/dL — CL (ref 12.0–15.0)
Hemoglobin: 7.3 g/dL — ABNORMAL LOW (ref 12.0–15.0)
MCH: 31.4 pg (ref 26.0–34.0)
MCH: 31.3 pg (ref 26.0–34.0)
MCHC: 35.2 g/dL (ref 30.0–36.0)
MCHC: 34.9 g/dL (ref 30.0–36.0)
MCV: 89.1 fL (ref 78.0–100.0)
MCV: 89.7 fL (ref 78.0–100.0)
Platelets: 164 10*3/uL (ref 150–400)
Platelets: 171 10*3/uL (ref 150–400)
RBC: 2.2 MIL/uL — ABNORMAL LOW (ref 3.87–5.11)
RBC: 2.33 MIL/uL — ABNORMAL LOW (ref 3.87–5.11)
RDW: 14.8 % (ref 11.5–15.5)
RDW: 14.9 % (ref 11.5–15.5)
WBC: 7.9 10*3/uL (ref 4.0–10.5)
WBC: 10.2 10*3/uL (ref 4.0–10.5)

## 2013-11-27 LAB — COMPREHENSIVE METABOLIC PANEL
ALT: 15 U/L (ref 0–35)
AST: 17 U/L (ref 0–37)
Albumin: 2.4 g/dL — ABNORMAL LOW (ref 3.5–5.2)
Alkaline Phosphatase: 109 U/L (ref 39–117)
BUN: 82 mg/dL — ABNORMAL HIGH (ref 6–23)
CO2: 9 mEq/L — CL (ref 19–32)
Calcium: 6.7 mg/dL — ABNORMAL LOW (ref 8.4–10.5)
Chloride: 109 mEq/L (ref 96–112)
Creatinine, Ser: 14.16 mg/dL — ABNORMAL HIGH (ref 0.50–1.10)
GFR calc Af Amer: 3 mL/min — ABNORMAL LOW (ref >90)
GFR calc non Af Amer: 3 mL/min — ABNORMAL LOW (ref >90)
Glucose, Bld: 122 mg/dL — ABNORMAL HIGH (ref 70–99)
Potassium: 4.5 mEq/L (ref 3.7–5.3)
Sodium: 141 mEq/L (ref 137–147)
Total Bilirubin: 0.2 mg/dL — ABNORMAL LOW (ref 0.3–1.2)
Total Protein: 6.2 g/dL (ref 6.0–8.3)

## 2013-11-27 LAB — PHOSPHORUS: Phosphorus: 9.9 mg/dL — ABNORMAL HIGH (ref 2.3–4.6)

## 2013-11-27 LAB — PROTIME-INR
INR: 1.02 (ref 0.00–1.49)
Prothrombin Time: 13.4 seconds (ref 11.6–15.2)

## 2013-11-27 LAB — HEMOGLOBIN A1C
Hgb A1c MFr Bld: 4.7 % (ref <5.7)
Mean Plasma Glucose: 88 mg/dL (ref <117)

## 2013-11-27 LAB — PROTEIN / CREATININE RATIO, URINE
Creatinine, Urine: 96.85 mg/dL
Protein Creatinine Ratio: 11.98 — ABNORMAL HIGH (ref 0.00–0.15)
Total Protein, Urine: 1160 mg/dL

## 2013-11-27 LAB — TROPONIN I: Troponin I: <0.3 ng/mL (ref <0.30)

## 2013-11-27 LAB — MRSA PCR SCREENING: MRSA by PCR: NEGATIVE

## 2013-11-27 LAB — ABO/RH: ABO/RH(D): O POS

## 2013-11-27 MED ORDER — PENTAFLUOROPROP-TETRAFLUOROETH EX AERO: 1.0000 "application " | INHALATION_SPRAY | CUTANEOUS | Status: DC | PRN

## 2013-11-27 MED ORDER — GELATIN ABSORBABLE 12-7 MM EX MISC
CUTANEOUS | Status: AC
  Filled 2013-11-27: qty 1

## 2013-11-27 MED ORDER — ALTEPLASE 2 MG IJ SOLR
2.0000 mg | Freq: Once | INTRAMUSCULAR | Status: DC | PRN
Start: 2013-11-27 — End: 2013-11-27
  Filled 2013-11-27: qty 2

## 2013-11-27 MED ORDER — ACETAMINOPHEN 325 MG PO TABS
ORAL_TABLET | ORAL | Status: AC
  Filled 2013-11-27: qty 2

## 2013-11-27 MED ORDER — MIDAZOLAM HCL 2 MG/2ML IJ SOLN
INTRAMUSCULAR | Status: AC
  Filled 2013-11-27: qty 4

## 2013-11-27 MED ORDER — HEPARIN SODIUM (PORCINE) 1000 UNIT/ML DIALYSIS: 1000.0000 [IU] | INTRAMUSCULAR | Status: DC | PRN

## 2013-11-27 MED ORDER — ACETAMINOPHEN 325 MG PO TABS
650.0000 mg | ORAL_TABLET | Freq: Four times a day (QID) | ORAL | Status: DC | PRN
  Administered 2013-11-27: 650 mg via ORAL

## 2013-11-27 MED ORDER — MIDAZOLAM HCL 2 MG/2ML IJ SOLN
INTRAMUSCULAR | Status: AC | PRN
  Administered 2013-11-27: 2 mg via INTRAVENOUS

## 2013-11-27 MED ORDER — OXYCODONE HCL 5 MG PO TABS
5.0000 mg | ORAL_TABLET | ORAL | Status: AC | PRN
  Administered 2013-11-27 – 2013-11-29 (×4): 5 mg via ORAL
  Filled 2013-11-27 (×3): qty 1

## 2013-11-27 MED ORDER — SODIUM CHLORIDE 0.9 % IV SOLN: 100.0000 mL | INTRAVENOUS | Status: DC | PRN

## 2013-11-27 MED ORDER — CEFAZOLIN (ANCEF) 1 G IV SOLR
2.0000 g | INTRAVENOUS | Status: AC
  Administered 2013-11-27: 2 g

## 2013-11-27 MED ORDER — LIDOCAINE-PRILOCAINE 2.5-2.5 % EX CREA
1.0000 | TOPICAL_CREAM | CUTANEOUS | Status: DC | PRN
Start: 2013-11-27 — End: 2013-11-27
  Filled 2013-11-27: qty 5

## 2013-11-27 MED ORDER — LIDOCAINE HCL (PF) 1 % IJ SOLN: 5.0000 mL | INTRAMUSCULAR | Status: DC | PRN

## 2013-11-27 MED ORDER — NEPRO/CARBSTEADY PO LIQD
237.0000 mL | ORAL | Status: DC | PRN
  Filled 2013-11-27: qty 237

## 2013-11-27 MED ORDER — FENTANYL CITRATE 0.05 MG/ML IJ SOLN
INTRAMUSCULAR | Status: AC
  Filled 2013-11-27: qty 2

## 2013-11-27 MED ORDER — HEPARIN SODIUM (PORCINE) 1000 UNIT/ML DIALYSIS: 2000.0000 [IU] | INTRAMUSCULAR | Status: DC | PRN

## 2013-11-27 MED ORDER — CEFAZOLIN SODIUM-DEXTROSE 2-3 GM-% IV SOLR
INTRAVENOUS | Status: AC
  Filled 2013-11-27: qty 50

## 2013-11-27 MED ORDER — FENTANYL CITRATE 0.05 MG/ML IJ SOLN
INTRAMUSCULAR | Status: AC | PRN
  Administered 2013-11-27 (×3): 50 ug via INTRAVENOUS

## 2013-11-27 MED ORDER — HEPARIN SODIUM (PORCINE) 1000 UNIT/ML IJ SOLN
INTRAMUSCULAR | Status: AC
  Filled 2013-11-27: qty 1

## 2013-11-27 MED ORDER — NEPRO/CARBSTEADY PO LIQD
237.0000 mL | Freq: Two times a day (BID) | ORAL | Status: DC
  Administered 2013-11-28 – 2013-12-03 (×5): 237 mL via ORAL

## 2013-11-27 MED ORDER — LIDOCAINE-PRILOCAINE 2.5-2.5 % EX CREA
1.0000 "application " | TOPICAL_CREAM | CUTANEOUS | Status: DC | PRN
  Filled 2013-11-27: qty 5

## 2013-11-27 MED ORDER — ALTEPLASE 2 MG IJ SOLR
2.0000 mg | Freq: Once | INTRAMUSCULAR | Status: DC | PRN
  Filled 2013-11-27: qty 2

## 2013-11-27 MED ORDER — MORPHINE SULFATE 4 MG/ML IJ SOLN
INTRAMUSCULAR | Status: AC
  Administered 2013-11-27: 4 mg
  Filled 2013-11-27: qty 1

## 2013-11-27 NOTE — Procedures (Signed)
I was present at this dialysis session, have reviewed the session itself and made  appropriate changes  Kelly Splinter MD (pgr) [phone]    ([phone] 11/27/2013, 4:15 PM

## 2013-11-27 NOTE — Sedation Documentation (Signed)
Notified Dr. Anselm Pancoast of change in heart rate from 90s to 135-141 sustained regular appearing rhythm.  Pt responded to her name w/ no complaints.

## 2013-11-27 NOTE — Progress Notes (Signed)
CRITICAL VALUE ALERT  Critical value received:  CO2 9  Date of notification:  11/27/13  Time of notification:  [id number]  Critical value read back:Yes.     Nurse who received alert: Tasia Catchings  MD notified (1st page):  Kathaleen Bury  Time of first page:  [phone]  MD notified (2nd page):Kirby, NP  Time of second page: [phone]  Responding MD:  Baltazar Najjar, NP  Time MD responded:  0500

## 2013-11-27 NOTE — Progress Notes (Signed)
[facility] PROGRESS NOTE Interim History: 53 y.o. year-old patient with hx of CKD, hep C and HTN. She presents with progressive anorexia, loss of taste, leg and facial swelling, loss of energy,ED today creat is 14. In 2011 it was 0.72. In June last year is was 2.5, in Jan this year creat was 2.96 and in March creat was up to 3.6, no NSAID's due to PUD. Renal US showed Chronic medical renal disease. no neprhrotoxins , ACEi or contrast.  Assessment/Plan: AKI (acute kidney injury) - renal on board, IR to place Diatek cath. Will need HD   - Unclear etiology of AKI there are no nephrotoxin drugs, ? Due Hep C. -   HTN (hypertension): - Bp high, cont hydralazine.  Anemia of chronic disease: - type and screen, transfuse 2 units.  Cirrhosis of liver without mention of alcohol, suggested by abdominal US/ Hepatitis C antibody test positive - not on treatment.   Rash/itching: - d/c solumedrol, cont atarax. - cont atarax for itching, rash most likely from scrathing.  Code Status: full Family Communication: none  Disposition Plan: inpatient   Consultants:  Renal  Procedures:  Hd cath 6.30.2015  Antibiotics:  None  HPI/Subjective: Tired sleepy  Objective: Filed Vitals:   11/26/13 2115 11/26/13 2354 11/27/13 0403 11/27/13 0722  BP: 136/59 159/89 118/86 136/89  Pulse: 85 80  96  Temp:  97.5 F (36.4 C) 97.5 F (36.4 C) 98.1 F (36.7 C)  TempSrc:  Oral Oral Oral  Resp: 17 16  17   Height:      Weight:   58.3 kg (128 lb 8.5 oz)   SpO2: 100% 100%  100%   No intake or output data in the 24 hours ending 11/27/13 0735 Filed Weights   11/26/13 2057 11/27/13 0403  Weight: 58.3 kg (128 lb 8.5 oz) 58.3 kg (128 lb 8.5 oz)    Exam:  General: Alert, awake, oriented x3, in no acute distress.  HEENT: No bruits, no goiter. +JVD Heart: Regular rate and rhythm,  Lungs: Good air movement,clear.  Abdomen: Soft, nontender, nondistended, positive bowel sounds.  Neuro:  Grossly intact, nonfocal.   Data Reviewed: Basic Metabolic Panel:  Recent Labs Lab 11/26/13 1452 11/27/13 0300  NA 138 141  K 4.0 4.5  CL 104 109  CO2 9* 9*  GLUCOSE 78 122*  BUN 82* 82*  CREATININE 14.19* 14.16*  CALCIUM 7.1* 6.7*  PHOS  --  9.9*   Liver Function Tests:  Recent Labs Lab 11/26/13 1452 11/27/13 0300  AST 19 17  ALT 16 15  ALKPHOS 81 109  BILITOT 0.2* 0.2*  PROT 6.8 6.2  ALBUMIN 2.5* 2.4*   No results found for this basename: LIPASE, AMYLASE,  in the last 168 hours No results found for this basename: AMMONIA,  in the last 168 hours CBC:  Recent Labs Lab 11/26/13 1452 11/27/13 0300  WBC 9.7 7.9  HGB 7.3* 6.9*  HCT 20.8* 19.6*  MCV 88.1 89.1  PLT 205 164   Cardiac Enzymes:  Recent Labs Lab 11/26/13 2005  TROPONINI <0.30   BNP (last 3 results)  Recent Labs  11/26/13 1732  PROBNP 11948.0*   CBG:  Recent Labs Lab 11/26/13 2056  GLUCAP 93    Recent Results (from the past 240 hour(s))  MRSA PCR SCREENING     Status: None   Collection Time    11/26/13  9:20 PM      Result Value Ref Range Status   MRSA by PCR NEGATIVE  NEGATIVE Final   Comment:            The GeneXpert MRSA Assay (FDA     approved for NASAL specimens     only), is one component of a     comprehensive MRSA colonization     surveillance program. It is not     intended to diagnose MRSA     infection nor to guide or     monitor treatment for     MRSA infections.     Studies: Dg Chest 2 View  11/26/2013   CLINICAL DATA:  Two week history of shortness of breath, rash, and swelling of the legs and face. Smoker with current history of hypertension.  EXAM: CHEST  2 VIEW  COMPARISON:  11/03/2012.  FINDINGS: Cardiac silhouette upper normal in size to slightly enlarged. Hilar and mediastinal contours otherwise unremarkable. Moderate to marked hyperinflation, more so than on the prior examination, now with likely air cysts or blebs in the right middle lobe. Lungs  clear. Bronchovascular markings normal. Pulmonary vascularity normal. No visible pleural effusions. No pneumothorax. Visualized bony thorax intact. Nipple shadows account for apparent nodular opacities in the lung bases on the AP view.  IMPRESSION: Borderline heart size. COPD/emphysema. No acute cardiopulmonary disease.   Electronically Signed   By: Evangeline Dakin M.D.   On: 11/26/2013 15:32   US Renal  11/27/2013   CLINICAL DATA:  Severe renal failure.  EXAM: RENAL/URINARY TRACT ULTRASOUND COMPLETE  COMPARISON:  11/03/2012  FINDINGS: Right Kidney:  Length: 9 cm. There is markedly increased echogenicity of the cortex, similar to prior. No hydronephrosis or solid mass. There may be trace edema around the right kidney, versus trace hepatorenal fossa ascites.  Left Kidney:  Length: 9 cm. Markedly increased echogenicity of the cortex, similar to prior. No hydronephrosis. There is an 18 mm simple appearing cyst in the interpolar region.  Bladder:  Appears normal for degree of bladder distention. Bilateral ureteral jets are noted.  IMPRESSION: Chronic medical renal disease.  No hydronephrosis.   Electronically Signed   By: Jorje Guild M.D.   On: 11/27/2013 05:35    Scheduled Meds: . hydrALAZINE  25 mg Oral TID  . methylPREDNISolone (SOLU-MEDROL) injection  125 mg Intravenous Q12H  . pantoprazole (PROTONIX) IV  40 mg Intravenous Q12H  . sodium chloride  3 mL Intravenous Q12H   Continuous Infusions:    Charlynne Cousins  [facility] Pager [phone]. If 8PM-8AM, please contact night-coverage at [email or web address], password [facility] 11/27/2013, 7:35 AM  LOS: 1 day      **Disclaimer: This note may have been dictated with voice recognition software. Similar sounding words can inadvertently be transcribed and this note may contain transcription errors which may not have been corrected upon publication of note.**

## 2013-11-27 NOTE — Progress Notes (Signed)
INITIAL NUTRITION ASSESSMENT  DOCUMENTATION CODES Per approved criteria  -Non-severe (moderate) malnutrition in the context of chronic illness   INTERVENTION: Nepro Shake po BID, each supplement provides 425 kcal and 19 grams protein RD to follow for nutrition care plan  NUTRITION DIAGNOSIS: Increased nutrient needs related to CKD, need for HD as evidenced by estimated nutrition needs  Goal: Pt to meet >/= 90% of their estimated nutrition needs   Monitor:  PO & supplemental intake, weight, labs, I/O's  Reason for Assessment: Malnutrition Screening Tool Report  53 y.o. female  Admitting Dx: AKI, swelling, anemia  ASSESSMENT: 53 y.o. year-old patient with hx of CKD, hep C and HTN. She presents with progressive anorexia, loss of taste, leg and facial swelling, loss of energy, cold "all the time" and diarrhea over the past several months; found to be in end-stage renal failure.  Patient s/p procedure 6/30: IR FLUORO GUIDE CV LINE RIGHT   Patient reports she's had a poor appetite for the past several weeks and hasn't had a "taste" for any food; also endorses a 40 lb weight loss, however, per wt readings, pt has had a 10 lb weight loss since March 2015 -- significant for time frame; amenable to oral nutrition supplements -- RD to order.  No muscle or subcutaneous fat depletion noticed.  Patient meets criteria for non-severe (moderate) malnutrition in the context of chronic illness as evidenced by < 75% intake of estimated energy requirement for > 1 month and 7% weight loss x 3 months.  Height: Ht Readings from Last 1 Encounters:  11/26/13 5\' 7"  (1.702 m)    Weight: Wt Readings from Last 1 Encounters:  11/27/13 128 lb 8.5 oz (58.3 kg)    Ideal Body Weight: 135 lb  % Ideal Body Weight: 95%  Wt Readings from Last 20 Encounters:  11/27/13 128 lb 8.5 oz (58.3 kg)  08/09/13 138 lb (62.596 kg)  06/05/13 129 lb 6.6 oz (58.7 kg)  06/05/13 129 lb 6.6 oz (58.7 kg)  06/05/13 129  lb 6.6 oz (58.7 kg)  11/05/12 136 lb (61.689 kg)    Usual Body Weight: 138 lb -- March 2015  % Usual Body Weight: 93%  BMI:  Body mass index is 20.13 kg/(m^2).  Estimated Nutritional Needs: Kcal: 1900-2100 Protein: 90-100 gm  Fluid: 1200 ml  Skin: Intact  Diet Order: Renal/Carbohydrate Modified with 1200 ml fluid restriction   EDUCATION NEEDS: -No education needs identified at this time   Intake/Output Summary (Last 24 hours) at 11/27/13 1433 Last data filed at 11/27/13 1423  Gross per 24 hour  Intake    240 ml  Output      0 ml  Net    240 ml    Labs:   Recent Labs Lab 11/26/13 1452 11/27/13 0300  NA 138 141  K 4.0 4.5  CL 104 109  CO2 9* 9*  BUN 82* 82*  CREATININE 14.19* 14.16*  CALCIUM 7.1* 6.7*  PHOS  --  9.9*  GLUCOSE 78 122*    CBG (last 3)   Recent Labs  11/26/13 2056  GLUCAP 93    Scheduled Meds: . ceFAZolin      . fentaNYL      . gelatin adsorbable      . heparin      . hydrALAZINE  25 mg Oral TID  . midazolam      . pantoprazole (PROTONIX) IV  40 mg Intravenous Q12H  . sodium chloride  3 mL Intravenous Q12H  Continuous Infusions:   Past Medical History  Diagnosis Date  . Asthma   . Hepatitis C antibody test positive   . Hypertension     "just dx'd today" (11/03/2012)  . Anginal pain     Past Surgical History  Procedure Laterality Date  . Abdominal hysterectomy      "partial" (11/03/2012)  . Esophagogastroduodenoscopy N/A 06/05/2013    Procedure: ESOPHAGOGASTRODUODENOSCOPY (EGD);  Surgeon: Winfield Cunas., MD;  Location: [facility];  Service: Endoscopy;  Laterality: N/A;    Arthur Holms, RD, LDN Pager #: [phone] After-Hours Pager #: [phone]

## 2013-11-27 NOTE — H&P (Signed)
Referring Physician: Dr. Jonnie Finner HPI: Karen Morrow is an 53 y.o. female with PMHx of CKD, hepatitis C, HTN who presented with loss of appetite, loss of energy, facial and LE swelling. Cr 14 and IR received request for tunneled HD catheter placement for dialysis today followed by blood transfusion. She denies any active bleeding, she does admit to some bright blood on the toilet paper with BM, but denies any dark or tarry stools. She denies any chest pain or palpitations. She denies any recent fever or chills. The patient denies any history of sleep apnea or chronic oxygen use. She denies any known complications to sedation.    Past Medical History:  Past Medical History  Diagnosis Date  . Asthma   . Hepatitis C antibody test positive   . Hypertension     "just dx'd today" (11/03/2012)  . Anginal pain     Past Surgical History:  Past Surgical History  Procedure Laterality Date  . Abdominal hysterectomy      "partial" (11/03/2012)  . Esophagogastroduodenoscopy N/A 06/05/2013    Procedure: ESOPHAGOGASTRODUODENOSCOPY (EGD);  Surgeon: Winfield Cunas., MD;  Location: [facility];  Service: Endoscopy;  Laterality: N/A;    Family History: History reviewed. No pertinent family history.  Social History:  reports that she has been smoking Cigarettes.  She has a 10 pack-year smoking history. She has never used smokeless tobacco. She reports that she drinks about 27.6 ounces of alcohol per week. She reports that she uses illicit drugs (Marijuana).  Allergies: No Known Allergies  Medications:   Medication List    ASK your doctor about these medications       diphenhydrAMINE 25 MG tablet  Commonly known as:  SOMINEX  Take 25 mg by mouth at bedtime as needed for itching or sleep.     hydrALAZINE 25 MG tablet  Commonly known as:  APRESOLINE  Take 1 tablet (25 mg total) by mouth 3 (three) times daily.     pantoprazole 40 MG tablet  Commonly known as:  PROTONIX  Take 1 tablet (40 mg  total) by mouth 2 (two) times daily.     zolpidem 10 MG tablet  Commonly known as:  AMBIEN  Take 1 tablet (10 mg total) by mouth at bedtime as needed for sleep.       Please HPI for pertinent positives, otherwise complete 10 system ROS negative.  Physical Exam: BP 136/89  Pulse 96  Temp(Src) 98.1 F (36.7 C) (Oral)  Resp 17  Ht '5\' 7"'  (1.702 m)  Wt 128 lb 8.5 oz (58.3 kg)  BMI 20.13 kg/m2  SpO2 100% Body mass index is 20.13 kg/(m^2).  General Appearance:  Alert, cooperative, no distress, facial edema  Head:  Normocephalic, without obvious abnormality, atraumatic  Neck: Supple, symmetrical, trachea midline  Lungs:   Clear to auscultation bilaterally, no w/r/r, respirations unlabored without use of accessory muscles.  Chest Wall:  No tenderness or deformity  Heart:  Regular rate and rhythm, S1, S2 normal, no murmur, rub or gallop.  Abdomen:   Soft, non-tender, non distended.  Extremities: Extremities normal, atraumatic, trace edema B/L  Neurologic: Normal affect, no gross deficits.   Results for orders placed during the hospital encounter of 11/26/13 (from the past 48 hour(s))  CBC     Status: Abnormal   Collection Time    11/26/13  2:52 PM      Result Value Ref Range   WBC 9.7  4.0 - 10.5 K/uL   RBC 2.36 (*)  3.87 - 5.11 MIL/uL   Hemoglobin 7.3 (*) 12.0 - 15.0 g/dL   HCT 20.8 (*) 36.0 - 46.0 %   MCV 88.1  78.0 - 100.0 fL   MCH 30.9  26.0 - 34.0 pg   MCHC 35.1  30.0 - 36.0 g/dL   RDW 14.5  11.5 - 15.5 %   Platelets 205  150 - 400 K/uL  BASIC METABOLIC PANEL     Status: Abnormal   Collection Time    11/26/13  2:52 PM      Result Value Ref Range   Sodium 138  137 - 147 mEq/L   Potassium 4.0  3.7 - 5.3 mEq/L   Chloride 104  96 - 112 mEq/L   CO2 9 (*) 19 - 32 mEq/L   Comment: CRITICAL RESULT CALLED TO, READ BACK BY AND VERIFIED WITH:     EMILY BIVENS,RN 11/26/13 1612 SHIPMAN M   Glucose, Bld 78  70 - 99 mg/dL   BUN 82 (*) 6 - 23 mg/dL   Creatinine, Ser 14.19 (*) 0.50  - 1.10 mg/dL   Calcium 7.1 (*) 8.4 - 10.5 mg/dL   GFR calc non Af Amer 3 (*) >90 mL/min   GFR calc Af Amer 3 (*) >90 mL/min   Comment: (NOTE)     The eGFR has been calculated using the CKD EPI equation.     This calculation has not been validated in all clinical situations.     eGFR's persistently <90 mL/min signify possible Chronic Kidney     Disease.  HEPATIC FUNCTION PANEL     Status: Abnormal   Collection Time    11/26/13  2:52 PM      Result Value Ref Range   Total Protein 6.8  6.0 - 8.3 g/dL   Albumin 2.5 (*) 3.5 - 5.2 g/dL   AST 19  0 - 37 U/L   ALT 16  0 - 35 U/L   Alkaline Phosphatase 81  39 - 117 U/L   Total Bilirubin 0.2 (*) 0.3 - 1.2 mg/dL   Bilirubin, Direct <0.2  0.0 - 0.3 mg/dL   Indirect Bilirubin NOT CALCULATED  0.3 - 0.9 mg/dL  Randolm Idol, ED     Status: None   Collection Time    11/26/13  3:03 PM      Result Value Ref Range   Troponin i, poc 0.00  0.00 - 0.08 ng/mL   Comment 3            Comment: Due to the release kinetics of cTnI,     a negative result within the first hours     of the onset of symptoms does not rule out     myocardial infarction with certainty.     If myocardial infarction is still suspected,     repeat the test at appropriate intervals.  PRO B NATRIURETIC PEPTIDE     Status: Abnormal   Collection Time    11/26/13  5:32 PM      Result Value Ref Range   Pro B Natriuretic peptide (BNP) 11948.0 (*) 0 - 125 pg/mL  URINALYSIS, ROUTINE W REFLEX MICROSCOPIC     Status: Abnormal   Collection Time    11/26/13  6:19 PM      Result Value Ref Range   Color, Urine YELLOW  YELLOW   APPearance TURBID (*) CLEAR   Specific Gravity, Urine 1.021  1.005 - 1.030   pH 5.5  5.0 - 8.0   Glucose, UA  NEGATIVE  NEGATIVE mg/dL   Hgb urine dipstick SMALL (*) NEGATIVE   Bilirubin Urine NEGATIVE  NEGATIVE   Ketones, ur NEGATIVE  NEGATIVE mg/dL   Protein, ur >300 (*) NEGATIVE mg/dL   Urobilinogen, UA 0.2  0.0 - 1.0 mg/dL   Nitrite POSITIVE (*) NEGATIVE    Leukocytes, UA MODERATE (*) NEGATIVE  URINE MICROSCOPIC-ADD ON     Status: Abnormal   Collection Time    11/26/13  6:19 PM      Result Value Ref Range   Squamous Epithelial / LPF FEW (*) RARE   WBC, UA TOO NUMEROUS TO COUNT  <3 WBC/hpf   RBC / HPF 3-6  <3 RBC/hpf   Bacteria, UA MANY (*) RARE  I-STAT ARTERIAL BLOOD GAS, ED     Status: Abnormal   Collection Time    11/26/13  6:43 PM      Result Value Ref Range   pH, Arterial 7.250 (*) 7.350 - 7.450   pCO2 arterial 20.2 (*) 35.0 - 45.0 mmHg   pO2, Arterial 112.0 (*) 80.0 - 100.0 mmHg   Bicarbonate 8.9 (*) 20.0 - 24.0 mEq/L   TCO2 10  0 - 100 mmol/L   O2 Saturation 98.0     Acid-base deficit 17.0 (*) 0.0 - 2.0 mmol/L   Patient temperature 97.6 F     Collection site RADIAL, ALLEN'S TEST ACCEPTABLE     Drawn by Operator     Sample type ARTERIAL     Comment NOTIFIED PHYSICIAN    TROPONIN I     Status: None   Collection Time    11/26/13  8:05 PM      Result Value Ref Range   Troponin I <0.30  <0.30 ng/mL   Comment:            Due to the release kinetics of cTnI,     a negative result within the first hours     of the onset of symptoms does not rule out     myocardial infarction with certainty.     If myocardial infarction is still suspected,     repeat the test at appropriate intervals.  TSH     Status: None   Collection Time    11/26/13  8:05 PM      Result Value Ref Range   TSH 1.900  0.350 - 4.500 uIU/mL  SEDIMENTATION RATE     Status: Abnormal   Collection Time    11/26/13  8:05 PM      Result Value Ref Range   Sed Rate 74 (*) 0 - 22 mm/hr  SALICYLATE LEVEL     Status: Abnormal   Collection Time    11/26/13  8:05 PM      Result Value Ref Range   Salicylate Lvl <1.6 (*) 2.8 - 20.0 mg/dL  TYPE AND SCREEN     Status: None   Collection Time    11/26/13  8:05 PM      Result Value Ref Range   ABO/RH(D) O POS     Antibody Screen NEG     Sample Expiration 11/29/2013    ABO/RH     Status: None   Collection Time     11/26/13  8:05 PM      Result Value Ref Range   ABO/RH(D) O POS    URINE RAPID DRUG SCREEN (HOSP PERFORMED)     Status: Abnormal   Collection Time    11/26/13  8:21 PM  Result Value Ref Range   Opiates NONE DETECTED  NONE DETECTED   Cocaine NONE DETECTED  NONE DETECTED   Benzodiazepines NONE DETECTED  NONE DETECTED   Amphetamines NONE DETECTED  NONE DETECTED   Tetrahydrocannabinol POSITIVE (*) NONE DETECTED   Barbiturates NONE DETECTED  NONE DETECTED   Comment:            DRUG SCREEN FOR MEDICAL PURPOSES     ONLY.  IF CONFIRMATION IS NEEDED     FOR ANY PURPOSE, NOTIFY LAB     WITHIN 5 DAYS.                LOWEST DETECTABLE LIMITS     FOR URINE DRUG SCREEN     Drug Class       Cutoff (ng/mL)     Amphetamine      1000     Barbiturate      200     Benzodiazepine   732     Tricyclics       202     Opiates          300     Cocaine          300     THC              50  GLUCOSE, CAPILLARY     Status: None   Collection Time    11/26/13  8:56 PM      Result Value Ref Range   Glucose-Capillary 93  70 - 99 mg/dL  MRSA PCR SCREENING     Status: None   Collection Time    11/26/13  9:20 PM      Result Value Ref Range   MRSA by PCR NEGATIVE  NEGATIVE   Comment:            The GeneXpert MRSA Assay (FDA     approved for NASAL specimens     only), is one component of a     comprehensive MRSA colonization     surveillance program. It is not     intended to diagnose MRSA     infection nor to guide or     monitor treatment for     MRSA infections.  LACTIC ACID, PLASMA     Status: None   Collection Time    11/26/13 10:04 PM      Result Value Ref Range   Lactic Acid, Venous 0.5  0.5 - 2.2 mmol/L  COMPREHENSIVE METABOLIC PANEL     Status: Abnormal   Collection Time    11/27/13  3:00 AM      Result Value Ref Range   Sodium 141  137 - 147 mEq/L   Potassium 4.5  3.7 - 5.3 mEq/L   Chloride 109  96 - 112 mEq/L   CO2 9 (*) 19 - 32 mEq/L   Comment: CRITICAL RESULT CALLED TO, READ  BACK BY AND VERIFIED WITH:     TChriss Czar RN [phone] 0414 GREEN R   Glucose, Bld 122 (*) 70 - 99 mg/dL   BUN 82 (*) 6 - 23 mg/dL   Creatinine, Ser 14.16 (*) 0.50 - 1.10 mg/dL   Calcium 6.7 (*) 8.4 - 10.5 mg/dL   Total Protein 6.2  6.0 - 8.3 g/dL   Albumin 2.4 (*) 3.5 - 5.2 g/dL   AST 17  0 - 37 U/L   ALT 15  0 - 35 U/L   Alkaline Phosphatase 109  39 -  117 U/L   Total Bilirubin 0.2 (*) 0.3 - 1.2 mg/dL   GFR calc non Af Amer 3 (*) >90 mL/min   GFR calc Af Amer 3 (*) >90 mL/min   Comment: (NOTE)     The eGFR has been calculated using the CKD EPI equation.     This calculation has not been validated in all clinical situations.     eGFR's persistently <90 mL/min signify possible Chronic Kidney     Disease.  PROTIME-INR     Status: None   Collection Time    11/27/13  3:00 AM      Result Value Ref Range   Prothrombin Time 13.4  11.6 - 15.2 seconds   INR 1.02  0.00 - 1.49  PHOSPHORUS     Status: Abnormal   Collection Time    11/27/13  3:00 AM      Result Value Ref Range   Phosphorus 9.9 (*) 2.3 - 4.6 mg/dL  CBC     Status: Abnormal   Collection Time    11/27/13  3:00 AM      Result Value Ref Range   WBC 7.9  4.0 - 10.5 K/uL   RBC 2.20 (*) 3.87 - 5.11 MIL/uL   Hemoglobin 6.9 (*) 12.0 - 15.0 g/dL   Comment: CRITICAL RESULT CALLED TO, READ BACK BY AND VERIFIED WITH:     REPEATED TO VERIFY     E.OFORI,RN 0347 11/27/13 M.CAMPBELL   HCT 19.6 (*) 36.0 - 46.0 %   MCV 89.1  78.0 - 100.0 fL   MCH 31.4  26.0 - 34.0 pg   MCHC 35.2  30.0 - 36.0 g/dL   RDW 14.8  11.5 - 15.5 %   Platelets 164  150 - 400 K/uL  TROPONIN I     Status: None   Collection Time    11/27/13  6:54 AM      Result Value Ref Range   Troponin I <0.30  <0.30 ng/mL   Comment:            Due to the release kinetics of cTnI,     a negative result within the first hours     of the onset of symptoms does not rule out     myocardial infarction with certainty.     If myocardial infarction is still suspected,     repeat  the test at appropriate intervals.   Dg Chest 2 View  11/26/2013   CLINICAL DATA:  Two week history of shortness of breath, rash, and swelling of the legs and face. Smoker with current history of hypertension.  EXAM: CHEST  2 VIEW  COMPARISON:  11/03/2012.  FINDINGS: Cardiac silhouette upper normal in size to slightly enlarged. Hilar and mediastinal contours otherwise unremarkable. Moderate to marked hyperinflation, more so than on the prior examination, now with likely air cysts or blebs in the right middle lobe. Lungs clear. Bronchovascular markings normal. Pulmonary vascularity normal. No visible pleural effusions. No pneumothorax. Visualized bony thorax intact. Nipple shadows account for apparent nodular opacities in the lung bases on the AP view.  IMPRESSION: Borderline heart size. COPD/emphysema. No acute cardiopulmonary disease.   Electronically Signed   By: Evangeline Dakin M.D.   On: 11/26/2013 15:32   US Renal  11/27/2013   CLINICAL DATA:  Severe renal failure.  EXAM: RENAL/URINARY TRACT ULTRASOUND COMPLETE  COMPARISON:  11/03/2012  FINDINGS: Right Kidney:  Length: 9 cm. There is markedly increased echogenicity of the cortex, similar to prior. No  hydronephrosis or solid mass. There may be trace edema around the right kidney, versus trace hepatorenal fossa ascites.  Left Kidney:  Length: 9 cm. Markedly increased echogenicity of the cortex, similar to prior. No hydronephrosis. There is an 18 mm simple appearing cyst in the interpolar region.  Bladder:  Appears normal for degree of bladder distention. Bilateral ureteral jets are noted.  IMPRESSION: Chronic medical renal disease.  No hydronephrosis.   Electronically Signed   By: Jorje Guild M.D.   On: 11/27/2013 05:35    Assessment/Plan Acute on chronic kidney disease Hepatitis C HTN History of GI bleed Request for image guided tunneled hemodialysis catheter with moderate sedation Anemia, plan to transfuse with dialysis today Patient has  been NPO, no blood thinners given, afebrile, wbc wnl, labs reviewed. Risks and Benefits discussed with the patient. All of the patient's questions were answered, patient is agreeable to proceed. Consent signed and in chart.   Tsosie Billing D PA-C 11/27/2013, 9:17 AM

## 2013-11-27 NOTE — Progress Notes (Signed)
Echocardiogram 2D Echocardiogram has been performed.  Karen Morrow 11/27/2013, 2:38 PM

## 2013-11-27 NOTE — Progress Notes (Signed)
  [facility] Progress Note   Subjective: no new problems, US shows severely echogenic kidneys  Filed Vitals:   11/27/13 1115 11/27/13 1125 11/27/13 1200 11/27/13 1230  BP: 126/80 126/80 154/89 141/112  Pulse: 95 94 83 82  Temp:      TempSrc:      Resp: 15 15 17 14   Height:      Weight:      SpO2: 100% 100% 100% 100%   Exam: Thin AAF no distress  Mild JVD  Chest fine basilar crackles 1/3 up on R, L mostly clear  RRR 2/6 SEM RUSB  Abd soft, NTND, no mass, no ascites  Ext 1-2+ pitting pretib edema bilat Neuro is nf, Ox 3   UA >300 protein, tntc WBC, 3-6 rbc, many bact  CXR COPD , no acute disease Renal US 9 cm kidneys, markedly echogenic  Assessment:  1 CKD stage V- severe uremic symptoms and end-stage kidneys by [place].  HD cath is in and planning first HD today. Prob has hep C related renal disease.   2 HTN on hydralazine 3 Hep C diagnosed in 2011  4 Hx GIB  5 Tobacco / COPD by cxr  6 Volume excess 8 Metabolic acidosis  Plan- HD today and tomorrow, dialysis education    Kelly Splinter MD  pager [phone]    cell [phone]  11/27/2013, 2:38 PM     Recent Labs Lab 11/26/13 1452 11/27/13 0300  NA 138 141  K 4.0 4.5  CL 104 109  CO2 9* 9*  GLUCOSE 78 122*  BUN 82* 82*  CREATININE 14.19* 14.16*  CALCIUM 7.1* 6.7*  PHOS  --  9.9*    Recent Labs Lab 11/26/13 1452 11/27/13 0300  AST 19 17  ALT 16 15  ALKPHOS 81 109  BILITOT 0.2* 0.2*  PROT 6.8 6.2  ALBUMIN 2.5* 2.4*    Recent Labs Lab 11/26/13 1452 11/27/13 0300 11/27/13 1353  WBC 9.7 7.9 10.2  HGB 7.3* 6.9* 7.3*  HCT 20.8* 19.6* 20.9*  MCV 88.1 89.1 89.7  PLT 205 164 171   . ceFAZolin      . fentaNYL      . gelatin adsorbable      . heparin      . hydrALAZINE  25 mg Oral TID  . midazolam      . pantoprazole (PROTONIX) IV  40 mg Intravenous Q12H  . sodium chloride  3 mL Intravenous Q12H     hydrOXYzine, zolpidem

## 2013-11-27 NOTE — Sedation Documentation (Signed)
Pt spontaneously reverted back to NSR w/ no ectopy noted.  Other vitals stable, pt awakens to name.

## 2013-11-27 NOTE — Procedures (Signed)
Placement of right jugular Perm Cath.  Tip in lower SVC and ready to use.  No immediate complication.

## 2013-11-28 DIAGNOSIS — Z992 Dependence on renal dialysis: Secondary | ICD-10-CM

## 2013-11-28 DIAGNOSIS — E440 Moderate protein-calorie malnutrition: Secondary | ICD-10-CM | POA: Insufficient documentation

## 2013-11-28 DIAGNOSIS — N185 Chronic kidney disease, stage 5: Secondary | ICD-10-CM

## 2013-11-28 LAB — HEPATITIS B SURFACE ANTIGEN: Hepatitis B Surface Ag: NEGATIVE

## 2013-11-28 LAB — CBC
HCT: 17.9 % — ABNORMAL LOW (ref 36.0–46.0)
HCT: 25.2 % — ABNORMAL LOW (ref 36.0–46.0)
Hemoglobin: 6.2 g/dL — CL (ref 12.0–15.0)
Hemoglobin: 9 g/dL — ABNORMAL LOW (ref 12.0–15.0)
MCH: 30.4 pg (ref 26.0–34.0)
MCH: 31.4 pg (ref 26.0–34.0)
MCHC: 34.6 g/dL (ref 30.0–36.0)
MCHC: 35.7 g/dL (ref 30.0–36.0)
MCV: 87.7 fL (ref 78.0–100.0)
MCV: 87.8 fL (ref 78.0–100.0)
Platelets: 81 10*3/uL — ABNORMAL LOW (ref 150–400)
Platelets: 30 10*3/uL — ABNORMAL LOW (ref 150–400)
RBC: 2.04 MIL/uL — ABNORMAL LOW (ref 3.87–5.11)
RBC: 2.87 MIL/uL — ABNORMAL LOW (ref 3.87–5.11)
RDW: 14.4 % (ref 11.5–15.5)
RDW: 14 % (ref 11.5–15.5)
WBC: 10.4 10*3/uL (ref 4.0–10.5)
WBC: 12.7 10*3/uL — ABNORMAL HIGH (ref 4.0–10.5)

## 2013-11-28 LAB — COMPREHENSIVE METABOLIC PANEL
ALT: 10 U/L (ref 0–35)
AST: 12 U/L (ref 0–37)
Albumin: 2 g/dL — ABNORMAL LOW (ref 3.5–5.2)
Alkaline Phosphatase: 64 U/L (ref 39–117)
BUN: 56 mg/dL — ABNORMAL HIGH (ref 6–23)
CO2: 17 mEq/L — ABNORMAL LOW (ref 19–32)
Calcium: 6.8 mg/dL — ABNORMAL LOW (ref 8.4–10.5)
Chloride: 101 mEq/L (ref 96–112)
Creatinine, Ser: 9.8 mg/dL — ABNORMAL HIGH (ref 0.50–1.10)
GFR calc Af Amer: 5 mL/min — ABNORMAL LOW (ref >90)
GFR calc non Af Amer: 4 mL/min — ABNORMAL LOW (ref >90)
Glucose, Bld: 101 mg/dL — ABNORMAL HIGH (ref 70–99)
Potassium: 3.2 mEq/L — ABNORMAL LOW (ref 3.7–5.3)
Sodium: 138 mEq/L (ref 137–147)
Total Bilirubin: 0.2 mg/dL — ABNORMAL LOW (ref 0.3–1.2)
Total Protein: 5.5 g/dL — ABNORMAL LOW (ref 6.0–8.3)

## 2013-11-28 LAB — RETICULOCYTES
RBC.: 2.9 MIL/uL — ABNORMAL LOW (ref 3.87–5.11)
Retic Count, Absolute: 46.4 10*3/uL (ref 19.0–186.0)
Retic Ct Pct: 1.6 % (ref 0.4–3.1)

## 2013-11-28 LAB — HEPATITIS B SURFACE ANTIBODY,QUALITATIVE: Hep B S Ab: POSITIVE — AB

## 2013-11-28 LAB — PREPARE RBC (CROSSMATCH)

## 2013-11-28 LAB — HEPATITIS B CORE ANTIBODY, TOTAL: Hep B Core Total Ab: NONREACTIVE

## 2013-11-28 MED ORDER — METOPROLOL TARTRATE 1 MG/ML IV SOLN
5.0000 mg | INTRAVENOUS | Status: DC | PRN
Start: 2013-11-28 — End: 2013-12-07
  Filled 2013-11-28: qty 5

## 2013-11-28 MED ORDER — DARBEPOETIN ALFA-POLYSORBATE 40 MCG/0.4ML IJ SOLN
40.0000 ug | INTRAMUSCULAR | Status: DC
  Filled 2013-11-28: qty 0.4

## 2013-11-28 MED ORDER — HEPARIN SODIUM (PORCINE) 1000 UNIT/ML DIALYSIS: 2000.0000 [IU] | INTRAMUSCULAR | Status: DC | PRN

## 2013-11-28 MED ORDER — ONDANSETRON HCL 4 MG/2ML IJ SOLN
4.0000 mg | Freq: Four times a day (QID) | INTRAMUSCULAR | Status: DC | PRN
  Administered 2013-11-28: 4 mg via INTRAVENOUS
  Filled 2013-11-28: qty 2

## 2013-11-28 MED ORDER — SODIUM CHLORIDE 0.9 % IV SOLN: 100.0000 mL | INTRAVENOUS | Status: DC | PRN

## 2013-11-28 MED ORDER — NEPRO/CARBSTEADY PO LIQD
237.0000 mL | ORAL | Status: DC | PRN
  Filled 2013-11-28: qty 237

## 2013-11-28 MED ORDER — POTASSIUM CHLORIDE CRYS ER 20 MEQ PO TBCR
40.0000 meq | EXTENDED_RELEASE_TABLET | Freq: Once | ORAL | Status: AC
  Administered 2013-11-28: 40 meq via ORAL
  Filled 2013-11-28: qty 2

## 2013-11-28 MED ORDER — BISACODYL 10 MG RE SUPP
10.0000 mg | Freq: Every day | RECTAL | Status: DC
  Administered 2013-11-29: 10 mg via RECTAL
  Filled 2013-11-28 (×2): qty 1

## 2013-11-28 MED ORDER — PENTAFLUOROPROP-TETRAFLUOROETH EX AERO: 1.0000 "application " | INHALATION_SPRAY | CUTANEOUS | Status: DC | PRN

## 2013-11-28 MED ORDER — HEPARIN SODIUM (PORCINE) 1000 UNIT/ML DIALYSIS: 1000.0000 [IU] | INTRAMUSCULAR | Status: DC | PRN

## 2013-11-28 MED ORDER — ALTEPLASE 2 MG IJ SOLR
2.0000 mg | Freq: Once | INTRAMUSCULAR | Status: DC | PRN
  Filled 2013-11-28: qty 2

## 2013-11-28 MED ORDER — DOCUSATE SODIUM 100 MG PO CAPS
200.0000 mg | ORAL_CAPSULE | Freq: Two times a day (BID) | ORAL | Status: DC
  Administered 2013-11-28 – 2013-11-29 (×3): 200 mg via ORAL
  Filled 2013-11-28 (×6): qty 2

## 2013-11-28 MED ORDER — LIDOCAINE HCL (PF) 1 % IJ SOLN: 5.0000 mL | INTRAMUSCULAR | Status: DC | PRN

## 2013-11-28 MED ORDER — LIDOCAINE-PRILOCAINE 2.5-2.5 % EX CREA
1.0000 "application " | TOPICAL_CREAM | CUTANEOUS | Status: DC | PRN
  Filled 2013-11-28: qty 5

## 2013-11-28 MED ORDER — POLYETHYLENE GLYCOL 3350 17 G PO PACK
17.0000 g | PACK | Freq: Two times a day (BID) | ORAL | Status: DC
  Administered 2013-11-28 – 2013-11-29 (×3): 17 g via ORAL
  Filled 2013-11-28 (×6): qty 1

## 2013-11-28 NOTE — Progress Notes (Addendum)
[facility] PROGRESS NOTE  Interim History: 53 y.o. year-old patient with hx of CKD, hep C and HTN. She presents with progressive anorexia, loss of taste, leg and facial swelling, loss of energy,ED today creat is 14. In 2011 it was 0.72. In June last year is was 2.5, in Jan this year creat was 2.96 and in March creat was up to 3.6, no NSAID's due to PUD. Renal US showed Chronic medical renal disease. no neprhrotoxins , ACEi or contrast.     Assessment/Plan:   AKI (acute kidney injury) - renal on board, has right subclavian dialysis catheter placed by IR on 11/27/2013, started on dialysis by renal. - Unclear etiology of AKI there are no nephrotoxin drugs, ? Due Hep C. - Renal following.    HTN (hypertension): - Bp high, cont hydralazine.     Anemia of chronic disease:  - type and screen, transfuse 2 units on 11-28-13, she refused transfusion last night has been counseled, An.Panel, monitor H&H, place on twice a day PPI and check occult blood in stool.     Cirrhosis of liver without mention of alcohol, suggested by abdominal US/ Hepatitis C antibody test positive - not on treatment.      Rash/itching: - d/c solumedrol, cont atarax. - cont atarax for itching, rash most likely from scrathing.    Persistent tachycardia.  Due to severe anemia, patient refused transfusion counseled. Receiving transition now. We'll monitor. No chest pain or shortness of breath. TSH stable.    Constipation.  Commence bowel regimen.     Code Status: full Family Communication: none  Disposition Plan: inpatient   Consultants:  Renal  Procedures:  [place] cath 6.30.2015, echogram with EF 60% and LVH.   Echo  - Mild LVH with LVEF [id number], grade 1 diastolic dysfunction. Trivial mitral and aortic regurgitation. Mildly dilated left atrium. Unable to assess PASP.    Antibiotics:  None  HPI/Subjective: Tired sleepy  Objective: Filed Vitals:   11/28/13 0800  11/28/13 0830 11/28/13 0833 11/28/13 0847  BP: 119/90 128/96  127/94  Pulse: 92 102 100 123  Temp:   98 F (36.7 C) 98.2 F (36.8 C)  TempSrc:   Oral   Resp: 13 15 15 15   Height:      Weight:      SpO2: 100% 100% 100% 100%    Intake/Output Summary (Last 24 hours) at 11/28/13 0857 Last data filed at 11/28/13 0847  Gross per 24 hour  Intake  372.5 ml  Output   2300 ml  Net -1927.5 ml   Filed Weights   11/27/13 1547 11/27/13 1816 11/28/13 0405  Weight: 58.6 kg (129 lb 3 oz) 56.7 kg (125 lb) 56.7 kg (125 lb)    Exam:  General: Alert, awake, oriented x3, in no acute distress.  HEENT: No bruits, no goiter. +JVD Heart: Regular rate and rhythm,  Lungs: Good air movement,clear.  Abdomen: Soft, nontender, nondistended, positive bowel sounds.  Neuro: Grossly intact, nonfocal.   Data Reviewed: Basic Metabolic Panel:  Recent Labs Lab 11/26/13 1452 11/27/13 0300 11/28/13 0135  NA 138 141 138  K 4.0 4.5 3.2*  CL 104 109 101  CO2 9* 9* 17*  GLUCOSE 78 122* 101*  BUN 82* 82* 56*  CREATININE 14.19* 14.16* 9.80*  CALCIUM 7.1* 6.7* 6.8*  PHOS  --  9.9*  --    Liver Function Tests:  Recent Labs Lab 11/26/13 1452 11/27/13 0300 11/28/13 0135  AST 19 17 12   ALT 16 15 10  ALKPHOS 81 109 64  BILITOT 0.2* 0.2* 0.2*  PROT 6.8 6.2 5.5*  ALBUMIN 2.5* 2.4* 2.0*   No results found for this basename: LIPASE, AMYLASE,  in the last 168 hours No results found for this basename: AMMONIA,  in the last 168 hours CBC:  Recent Labs Lab 11/26/13 1452 11/27/13 0300 11/27/13 1353 11/28/13 0135  WBC 9.7 7.9 10.2 10.4  HGB 7.3* 6.9* 7.3* 6.2*  HCT 20.8* 19.6* 20.9* 17.9*  MCV 88.1 89.1 89.7 87.7  PLT 205 164 171 81*   Cardiac Enzymes:  Recent Labs Lab 11/26/13 2005 11/27/13 0654  TROPONINI <0.30 <0.30   BNP (last 3 results)  Recent Labs  11/26/13 1732  PROBNP 11948.0*   CBG:  Recent Labs Lab 11/26/13 2056  GLUCAP 93    Recent Results (from the past 240  hour(s))  MRSA PCR SCREENING     Status: None   Collection Time    11/26/13  9:20 PM      Result Value Ref Range Status   MRSA by PCR NEGATIVE  NEGATIVE Final   Comment:            The GeneXpert MRSA Assay (FDA     approved for NASAL specimens     only), is one component of a     comprehensive MRSA colonization     surveillance program. It is not     intended to diagnose MRSA     infection nor to guide or     monitor treatment for     MRSA infections.     Studies: Dg Chest 2 View  11/26/2013   CLINICAL DATA:  Two week history of shortness of breath, rash, and swelling of the legs and face. Smoker with current history of hypertension.  EXAM: CHEST  2 VIEW  COMPARISON:  11/03/2012.  FINDINGS: Cardiac silhouette upper normal in size to slightly enlarged. Hilar and mediastinal contours otherwise unremarkable. Moderate to marked hyperinflation, more so than on the prior examination, now with likely air cysts or blebs in the right middle lobe. Lungs clear. Bronchovascular markings normal. Pulmonary vascularity normal. No visible pleural effusions. No pneumothorax. Visualized bony thorax intact. Nipple shadows account for apparent nodular opacities in the lung bases on the AP view.  IMPRESSION: Borderline heart size. COPD/emphysema. No acute cardiopulmonary disease.   Electronically Signed   By: Evangeline Dakin M.D.   On: 11/26/2013 15:32   US Renal  11/27/2013   CLINICAL DATA:  Severe renal failure.  EXAM: RENAL/URINARY TRACT ULTRASOUND COMPLETE  COMPARISON:  11/03/2012  FINDINGS: Right Kidney:  Length: 9 cm. There is markedly increased echogenicity of the cortex, similar to prior. No hydronephrosis or solid mass. There may be trace edema around the right kidney, versus trace hepatorenal fossa ascites.  Left Kidney:  Length: 9 cm. Markedly increased echogenicity of the cortex, similar to prior. No hydronephrosis. There is an 18 mm simple appearing cyst in the interpolar region.  Bladder:  Appears  normal for degree of bladder distention. Bilateral ureteral jets are noted.  IMPRESSION: Chronic medical renal disease.  No hydronephrosis.   Electronically Signed   By: Jorje Guild M.D.   On: 11/27/2013 05:35   Ir Fluoro Guide Cv Line Right  11/27/2013   INDICATION: Acute on chronic kidney disease.  Patient is starting dialysis.  EXAM: FLUOROSCOPIC AND ULTRASOUND GUIDED PLACEMENT OF A TUNNELED DIALYSIS CATHETER  Physician: Stephan Minister. Henn, MD  FLUOROSCOPY TIME:  36 seconds  MEDICATIONS: 2 g Ancef, 2  mg versed, 100 mcg fentanyl. A radiology nurse monitored the patient for moderate sedation. As antibiotic prophylaxis, Ancef was ordered pre-procedure and administered intravenously within one hour of incision.  ANESTHESIA/SEDATION: Moderate sedation time: 25 minutes  PROCEDURE: Informed consent was obtained for placement of a tunneled dialysis catheter. The patient was placed supine on the interventional table. Ultrasound confirmed a patent right internal jugularvein. Ultrasound images were obtained for documentation. The right side of the neck was prepped and draped in a sterile fashion. The right side of the neck was anesthetized with 1% lidocaine. Maximal barrier sterile technique was utilized including caps, mask, sterile gowns, sterile gloves, sterile drape, hand hygiene and skin antiseptic. A small incision was made with #11 blade scalpel. A 21 gauge needle directed into the right internal jugular vein with ultrasound guidance. A micropuncture dilator set was placed. A 19 cm tip to cuff HemoSplit catheter was selected. The skin below the right clavicle was anesthetized and a small incision was made with an #11 blade scalpel. A subcutaneous tunnel was formed to the vein dermatotomy site. The catheter was brought through the tunnel. The vein dermatotomy site was dilated to accommodate a peel-away sheath. The catheter was placed through the peel-away sheath and directed into the central venous structures. The  tip of the catheter was placed in the lower SVC with fluoroscopy. Fluoroscopic images were obtained for documentation. Both lumens were found to aspirate and flush well. The proper amount of heparin was flushed in both lumens. The vein dermatotomy site was closed using a single layer of absorbable suture and Dermabond. The catheter was secured to the skin using Prolene suture. Gel-Foam was placed within the subcutaneous tract.  FINDINGS: Catheter tip in the lower SVC. Patient developed transient tachycardia following the catheter placement. The heart rhythm appeared to be regular with a rate between 130-140 bpm. The tachycardia lasted a couple of minutes then the heart rate returned to baseline. Patient was asymptomatic during this transient episode of tachycardia.  COMPLICATIONS: None  IMPRESSION: Successful placement of a right internal jugular tunneled dialysis catheter using ultrasound and fluoroscopic guidance.   Electronically Signed   By: Markus Daft M.D.   On: 11/27/2013 11:46   Ir US Guide Vasc Access Right  11/27/2013   INDICATION: Acute on chronic kidney disease.  Patient is starting dialysis.  EXAM: FLUOROSCOPIC AND ULTRASOUND GUIDED PLACEMENT OF A TUNNELED DIALYSIS CATHETER  Physician: Stephan Minister. Henn, MD  FLUOROSCOPY TIME:  36 seconds  MEDICATIONS: 2 g Ancef, 2 mg versed, 100 mcg fentanyl. A radiology nurse monitored the patient for moderate sedation. As antibiotic prophylaxis, Ancef was ordered pre-procedure and administered intravenously within one hour of incision.  ANESTHESIA/SEDATION: Moderate sedation time: 25 minutes  PROCEDURE: Informed consent was obtained for placement of a tunneled dialysis catheter. The patient was placed supine on the interventional table. Ultrasound confirmed a patent right internal jugularvein. Ultrasound images were obtained for documentation. The right side of the neck was prepped and draped in a sterile fashion. The right side of the neck was anesthetized with 1%  lidocaine. Maximal barrier sterile technique was utilized including caps, mask, sterile gowns, sterile gloves, sterile drape, hand hygiene and skin antiseptic. A small incision was made with #11 blade scalpel. A 21 gauge needle directed into the right internal jugular vein with ultrasound guidance. A micropuncture dilator set was placed. A 19 cm tip to cuff HemoSplit catheter was selected. The skin below the right clavicle was anesthetized and a small incision was made with  an #11 blade scalpel. A subcutaneous tunnel was formed to the vein dermatotomy site. The catheter was brought through the tunnel. The vein dermatotomy site was dilated to accommodate a peel-away sheath. The catheter was placed through the peel-away sheath and directed into the central venous structures. The tip of the catheter was placed in the lower SVC with fluoroscopy. Fluoroscopic images were obtained for documentation. Both lumens were found to aspirate and flush well. The proper amount of heparin was flushed in both lumens. The vein dermatotomy site was closed using a single layer of absorbable suture and Dermabond. The catheter was secured to the skin using Prolene suture. Gel-Foam was placed within the subcutaneous tract.  FINDINGS: Catheter tip in the lower SVC. Patient developed transient tachycardia following the catheter placement. The heart rhythm appeared to be regular with a rate between 130-140 bpm. The tachycardia lasted a couple of minutes then the heart rate returned to baseline. Patient was asymptomatic during this transient episode of tachycardia.  COMPLICATIONS: None  IMPRESSION: Successful placement of a right internal jugular tunneled dialysis catheter using ultrasound and fluoroscopic guidance.   Electronically Signed   By: Markus Daft M.D.   On: 11/27/2013 11:46    Scheduled Meds: . bisacodyl  10 mg Rectal Daily  . docusate sodium  200 mg Oral BID  . feeding supplement (NEPRO CARB STEADY)  237 mL Oral BID BM  .  hydrALAZINE  25 mg Oral TID  . pantoprazole (PROTONIX) IV  40 mg Intravenous Q12H  . polyethylene glycol  17 g Oral BID  . sodium chloride  3 mL Intravenous Q12H   Continuous Infusions:    [facility] Pager [phone]. If 8PM-8AM, please contact night-coverage at [email or web address], password [facility] 11/28/2013, 8:57 AM  LOS: 2 days      **Disclaimer: This note may have been dictated with voice recognition software. Similar sounding words can inadvertently be transcribed and this note may contain transcription errors which may not have been corrected upon publication of note.**

## 2013-11-28 NOTE — Progress Notes (Signed)
  [facility] Progress Note   Subjective: HD yesterday with ^'d HR, BP's soft, 2kg removed. No new complaints  Filed Vitals:   11/28/13 0830 11/28/13 0833 11/28/13 0847 11/28/13 0900  BP: 128/96  127/94 145/93  Pulse: 102 100 123 87  Temp:  98 F (36.7 C) 98.2 F (36.8 C)   TempSrc:  Oral    Resp: 15 15 15    Height:      Weight:      SpO2: 100% 100% 100% 100%   Exam: Thin AAF no distress  Mild JVD  Chest is clear bilat RRR 2/6 SEM RUSB  Abd soft, NTND, no mass, no ascites  1+ LE edema bilat Neuro is nf, Ox 3   UA >300 protein, tntc WBC, 3-6 rbc, many bact  CXR COPD , no acute disease Renal US 9 cm kidneys, markedly echogenic  Assessment:  1 CKD stage V- prob due to hepatitis C. 2nd HD today   2 HTN on hydralazine 3 Hep C diagnosed in 2011  4 Hx GIBs  5 Tobacco / COPD by cxr 6 Metabolic acidosis- improving 7 Anemia getting transfusion today, start aranesp, check Fe / tibc  Plan- HD today and tomorrow, dialysis education, aranesp, consult VVS for perm access, vein map ordered, move IV L arm    Kelly Splinter MD  pager [phone]    cell [phone]  11/28/2013, 10:45 AM     Recent Labs Lab 11/26/13 1452 11/27/13 0300 11/28/13 0135  NA 138 141 138  K 4.0 4.5 3.2*  CL 104 109 101  CO2 9* 9* 17*  GLUCOSE 78 122* 101*  BUN 82* 82* 56*  CREATININE 14.19* 14.16* 9.80*  CALCIUM 7.1* 6.7* 6.8*  PHOS  --  9.9*  --     Recent Labs Lab 11/26/13 1452 11/27/13 0300 11/28/13 0135  AST 19 17 12   ALT 16 15 10   ALKPHOS 81 109 64  BILITOT 0.2* 0.2* 0.2*  PROT 6.8 6.2 5.5*  ALBUMIN 2.5* 2.4* 2.0*    Recent Labs Lab 11/27/13 0300 11/27/13 1353 11/28/13 0135  WBC 7.9 10.2 10.4  HGB 6.9* 7.3* 6.2*  HCT 19.6* 20.9* 17.9*  MCV 89.1 89.7 87.7  PLT 164 171 81*   . bisacodyl  10 mg Rectal Daily  . docusate sodium  200 mg Oral BID  . feeding supplement (NEPRO CARB STEADY)  237 mL Oral BID BM  . hydrALAZINE  25 mg Oral TID  . pantoprazole  (PROTONIX) IV  40 mg Intravenous Q12H  . polyethylene glycol  17 g Oral BID  . sodium chloride  3 mL Intravenous Q12H     acetaminophen, hydrOXYzine, metoprolol, ondansetron (ZOFRAN) IV, oxyCODONE, zolpidem

## 2013-11-28 NOTE — Progress Notes (Signed)
CRITICAL VALUE ALERT  Critical value received: HGB 6.2  Date of notification:  11/28/13  Time of notification: 0256  Critical value read back: yes  Nurse who received alert: Leary Roca, RN  MD notified (1st page): Baltazar Najjar, NP  Time of first page:  0320  MD notified (2nd page):  Time of second page:  Responding MD: Baltazar Najjar, NP  Time MD responded:  [phone]

## 2013-11-28 NOTE — Consult Note (Signed)
[facility] NOTE   MRN : [id number]  Reason for Consult: CKD stage 5 Referring Physician: Sol Blazing, MD   History of Present Illness: 53 y/o female with hx of CKD, hep C and HTN.  At admission her creatinine 14.19, BUN 82  She has a temporary HD cath placed by IR on 11/27/2013.  We are consulted to provide long term dialysis access.  She is not on beta blockers, statins, or Asprin.  She has not seen a physician in a long time.       Current Facility-Administered Medications  Medication Dose Route Frequency Provider Last Rate Last Dose  . acetaminophen (TYLENOL) tablet 650 mg  650 mg Oral Q6H PRN Sol Blazing, MD   650 mg at 11/27/13 1751  . bisacodyl (DULCOLAX) suppository 10 mg  10 mg Rectal Daily Thurnell Lose, MD      . darbepoetin (ARANESP) injection 40 mcg  40 mcg Intravenous Q Wed-HD Sol Blazing, MD      . docusate sodium (COLACE) capsule 200 mg  200 mg Oral BID Thurnell Lose, MD   200 mg at 11/28/13 1019  . feeding supplement (NEPRO CARB STEADY) liquid 237 mL  237 mL Oral BID BM Rogue Bussing, RD   237 mL at 11/28/13 1025  . hydrALAZINE (APRESOLINE) tablet 25 mg  25 mg Oral TID Shanda Howells, MD   25 mg at 11/28/13 1019  . hydrOXYzine (ATARAX/VISTARIL) tablet 10 mg  10 mg Oral Q4H PRN Shanda Howells, MD   10 mg at 11/27/13 2251  . metoprolol (LOPRESSOR) injection 5 mg  5 mg Intravenous Q4H PRN Thurnell Lose, MD      . ondansetron [facility]) injection 4 mg  4 mg Intravenous Q6H PRN Barton Dubois, MD   4 mg at 11/28/13 0841  . oxyCODONE (Oxy IR/ROXICODONE) immediate release tablet 5 mg  5 mg Oral Q4H PRN Gardiner Barefoot, NP   5 mg at 11/28/13 0841  . pantoprazole (PROTONIX) injection 40 mg  40 mg Intravenous Q12H Shanda Howells, MD   40 mg at 11/28/13 1017  . polyethylene glycol (MIRALAX / GLYCOLAX) packet 17 g  17 g Oral BID Thurnell Lose, MD   17 g at 11/28/13 1018  . sodium chloride 0.9 % injection 3 mL  3  mL Intravenous Q12H Shanda Howells, MD   3 mL at 11/27/13 2155  . zolpidem (AMBIEN) tablet 10 mg  10 mg Oral QHS PRN Shanda Howells, MD   10 mg at 11/26/13 2313    Pt meds include: Statin :No Betablocker: No ASA: No Other anticoagulants/antiplatelets:   Past Medical History  Diagnosis Date  . Asthma   . Hepatitis C antibody test positive   . Hypertension     "just dx'd today" (11/03/2012)  . Anginal pain     Past Surgical History  Procedure Laterality Date  . Abdominal hysterectomy      "partial" (11/03/2012)  . Esophagogastroduodenoscopy N/A 06/05/2013    Procedure: ESOPHAGOGASTRODUODENOSCOPY (EGD);  Surgeon: Winfield Cunas., MD;  Location: [facility];  Service: Endoscopy;  Laterality: N/A;    Social History History  Substance Use Topics  . Smoking status: Current Every Day Smoker -- 0.50 packs/day for 20 years    Types: Cigarettes  . Smokeless tobacco: Never Used  . Alcohol Use: 27.6 oz/week    46 Cans of beer per week     Comment: 11/03/2012 "1-2 40oz beers/day maybe"  Family History Both parents DM, HTN  No Known Allergies   REVIEW OF SYSTEMS  General: [ ]  Weight loss, [ ]  Fever, [ ]  chills Neurologic: [ ]  Dizziness, [ ]  Blackouts, [ ]  Seizure [ ]  Stroke, [ ]  "Mini stroke", [ ]  Slurred speech, [ ]  Temporary blindness; [ ]  weakness in arms or legs, [ ]  Hoarseness [ ]  Dysphagia Cardiac: [ ]  Chest pain/pressure, [ ]  Shortness of breath at rest [x ] Shortness of breath with exertion, [ ]  Atrial fibrillation or irregular heartbeat  Vascular: [ ]  Pain in legs with walking, [ ]  Pain in legs at rest, [ ]  Pain in legs at night,  [ ]  Non-healing ulcer, [ ]  Blood clot in vein/DVT,   Pulmonary: [ ]  Home oxygen, [ ]  Productive cough, [ ]  Coughing up blood, [ ]  Asthma,  [ ]  Wheezing [ ]  COPD Musculoskeletal:  [ ]  Arthritis, [ ]  Low back pain, [ ]  Joint pain Hematologic: [ ]  Easy Bruising, [x ] Anemia; [ ]  Hepatitis Gastrointestinal: [ ]  Blood in stool, [ ]   Gastroesophageal Reflux/heartburn, Urinary: [x ] chronic Kidney disease, [ ]  on HD - [ ]  MWF or [ ]  TTHS, [ ]  Burning with urination, [ ]  Difficulty urinating Skin: [ ]  Rashes, [ ]  Wounds Psychological: [ ]  Anxiety, [ ]  Depression  Physical Examination Filed Vitals:   11/28/13 1000 11/28/13 1030 11/28/13 1100 11/28/13 1130  BP: 131/91 127/81 126/80 127/78  Pulse: 125 84 84 136  Temp:  98.1 F (36.7 C)  98.1 F (36.7 C)  TempSrc:      Resp: 10 14 13 13   Height:      Weight:      SpO2: 100% 100% 100% 100%   Body mass index is 19.57 kg/(m^2).  General:  WDWN in NAD HENT: WNL Eyes: Pupils equal Pulmonary: normal non-labored breathing , without Rales, rhonchi,  wheezing Cardiac: RRR, without  Murmurs, rubs or gallops; No carotid bruits Abdomen: soft, NT, no masses Skin: no rashes, ulcers noted;  no Gangrene , no cellulitis; no open wounds;   Vascular Exam/Pulses:palpable radial pulses bil.  Right has IV access at antecubital space, left brachial palpable.  She is left handed.   Musculoskeletal: no muscle wasting or atrophy; no edema  Neurologic: A&O X 3; Appropriate Affect ;  SENSATION: normal; MOTOR FUNCTION: 5/5 Symmetric Speech is fluent/normal   Significant Diagnostic Studies: CBC Lab Results  Component Value Date   WBC 10.4 11/28/2013   HGB 6.2* 11/28/2013   HCT 17.9* 11/28/2013   MCV 87.7 11/28/2013   PLT 81* 11/28/2013    BMET    Component Value Date/Time   NA 138 11/28/2013 0135   K 3.2* 11/28/2013 0135   CL 101 11/28/2013 0135   CO2 17* 11/28/2013 0135   GLUCOSE 101* 11/28/2013 0135   BUN 56* 11/28/2013 0135   CREATININE 9.80* 11/28/2013 0135   CREATININE 3.61* 08/09/2013 1528   CALCIUM 6.8* 11/28/2013 0135   GFRNONAA 4* 11/28/2013 0135   GFRAA 5* 11/28/2013 0135   Estimated Creatinine Clearance: 5.9 ml/min (by C-G formula based on Cr of 9.8).  COAG Lab Results  Component Value Date   INR 1.02 11/27/2013   INR 1.02 06/05/2013   INR 0.98 11/03/2012     Non-Invasive  Vascular Imaging: Pending vein mapping   ASSESSMENT: AKI on CKD Metabolic acidosis Anemia due to CKD Hypertension poorly controlled in the past  PLAN: AV fistula verses graft pending vein mapping Exchange temporary IJ catheter  for diatek tunnel catheter.  Laurence Slate [facility] 11/28/2013 12:18 PM    Agree with above.  Pt has tunneled catheter right IJ placed by IR several days ago She is left handed and would prefer right arm access Vein map shows poor quality veins bilaterally 2+ brachial and radial pulse bilaterally  Most likely right arm graft Will schedule for Monday July 6 with my partner Dr Donnetta Hutching Procedure details risk and benefit discussed with pt Need all IVs removed from right arm Restricted arm band right arm  Ruta Hinds, MD [facility] [place]: [phone] Pager: [phone]

## 2013-11-28 NOTE — Progress Notes (Signed)
Pt is refusing to receive blood at this time. Pt stated "It's to early, let me go back to sleep and I'll do it at 7:00 in the morning". Pt is A&Ox4 and understands the importance of receiving blood. Will report to day shift RN. Will continue to monitor.

## 2013-11-28 NOTE — Procedures (Signed)
I was present at this dialysis session, have reviewed the session itself and made  appropriate changes  Kelly Splinter MD (pgr) [phone]    ([phone] 11/28/2013, 2:46 PM

## 2013-11-28 NOTE — Progress Notes (Signed)
VASCULAR LAB PRELIMINARY  PRELIMINARY  PRELIMINARY  PRELIMINARY  Right  Upper Extremity Vein Map    Cephalic  Segment Diameter Depth Comment  1. Axilla 0.96 mm mm   2. Mid upper arm 0.41 mm mm   3. Above AC 0.53 mm mm   4. In AC mm mm IV in AC  5. Below AC mm mm   6. Mid forearm 0.53 mm mm   7. Wrist 0.68 mm mm    mm mm    mm mm    mm mm    Basilic  Segment Diameter Depth Comment  1. Axilla mm mm   2. Mid upper arm 2.83 mm 7.7 mm   3. Above AC mm mm   4. In AC 1.64 mm 5.9 mm   5. Below AC mm mm   6. Mid forearm 0.71 mm mm   7. Wrist mm mm    mm mm    mm mm    mm mm    Left Upper Extremity Vein Map    Cephalic  Segment Diameter Depth Comment  1. Axilla mm mm   2. Mid upper arm 0.62 mm mm   3. Above AC 1.19 mm 6.44 mm Thrombosis   4. In [facility] 1 mm mm Thrombosis  5. Below AC 0.69mm mm   6. Mid forearm mm mm   7. Wrist mm mm    mm mm    mm mm    mm mm    Basilic  Segment Diameter Depth Comment  1. Axilla mm mm   2. Mid upper arm  mm mm   3. Above AC 1.19 mm 6.44 mm   4. In AC mm mm   5. Below AC  mm mm   6. Mid forearm 0.56 mm mm   7. Wrist mm mm    mm mm    mm mm    mm mm     Quaran Kedzierski, RVT 11/28/2013, 12:18 PM

## 2013-11-29 DIAGNOSIS — B1920 Unspecified viral hepatitis C without hepatic coma: Secondary | ICD-10-CM

## 2013-11-29 LAB — CBC
HCT: 22.9 % — ABNORMAL LOW (ref 36.0–46.0)
HCT: 23.3 % — ABNORMAL LOW (ref 36.0–46.0)
HCT: 26.3 % — ABNORMAL LOW (ref 36.0–46.0)
Hemoglobin: 8.2 g/dL — ABNORMAL LOW (ref 12.0–15.0)
Hemoglobin: 8.3 g/dL — ABNORMAL LOW (ref 12.0–15.0)
Hemoglobin: 9.2 g/dL — ABNORMAL LOW (ref 12.0–15.0)
MCH: 30.8 pg (ref 26.0–34.0)
MCH: 30.6 pg (ref 26.0–34.0)
MCH: 30.3 pg (ref 26.0–34.0)
MCHC: 35.8 g/dL (ref 30.0–36.0)
MCHC: 35.6 g/dL (ref 30.0–36.0)
MCHC: 35 g/dL (ref 30.0–36.0)
MCV: 86.1 fL (ref 78.0–100.0)
MCV: 86 fL (ref 78.0–100.0)
MCV: 86.5 fL (ref 78.0–100.0)
Platelets: 29 10*3/uL — CL (ref 150–400)
Platelets: 12 10*3/uL — CL (ref 150–400)
Platelets: 15 10*3/uL — CL (ref 150–400)
RBC: 2.66 MIL/uL — ABNORMAL LOW (ref 3.87–5.11)
RBC: 2.71 MIL/uL — ABNORMAL LOW (ref 3.87–5.11)
RBC: 3.04 MIL/uL — ABNORMAL LOW (ref 3.87–5.11)
RDW: 13.8 % (ref 11.5–15.5)
RDW: 14.2 % (ref 11.5–15.5)
RDW: 14.3 % (ref 11.5–15.5)
WBC: 11.8 10*3/uL — ABNORMAL HIGH (ref 4.0–10.5)
WBC: 9.3 10*3/uL (ref 4.0–10.5)
WBC: 15.4 10*3/uL — ABNORMAL HIGH (ref 4.0–10.5)

## 2013-11-29 LAB — IRON AND TIBC
Iron: 61 ug/dL (ref 42–135)
Saturation Ratios: 29 % (ref 20–55)
TIBC: 211 ug/dL — ABNORMAL LOW (ref 250–470)
UIBC: 150 ug/dL (ref 125–400)

## 2013-11-29 LAB — FERRITIN: Ferritin: 357 ng/mL — ABNORMAL HIGH (ref 10–291)

## 2013-11-29 LAB — HEMOGLOBIN AND HEMATOCRIT, BLOOD
HCT: 26.6 % — ABNORMAL LOW (ref 36.0–46.0)
Hemoglobin: 9.3 g/dL — ABNORMAL LOW (ref 12.0–15.0)

## 2013-11-29 LAB — OCCULT BLOOD X 1 CARD TO LAB, STOOL: Fecal Occult Bld: NEGATIVE

## 2013-11-29 LAB — TYPE AND SCREEN
ABO/RH(D): O POS
Antibody Screen: NEGATIVE
Unit division: 0
Unit division: 0

## 2013-11-29 LAB — COMPREHENSIVE METABOLIC PANEL
ALT: 10 U/L (ref 0–35)
AST: 19 U/L (ref 0–37)
Albumin: 2.2 g/dL — ABNORMAL LOW (ref 3.5–5.2)
Alkaline Phosphatase: 66 U/L (ref 39–117)
Anion gap: 17 — ABNORMAL HIGH (ref 5–15)
BUN: 35 mg/dL — ABNORMAL HIGH (ref 6–23)
CO2: 21 mEq/L (ref 19–32)
Calcium: 7.1 mg/dL — ABNORMAL LOW (ref 8.4–10.5)
Chloride: 101 mEq/L (ref 96–112)
Creatinine, Ser: 6.62 mg/dL — ABNORMAL HIGH (ref 0.50–1.10)
GFR calc Af Amer: 7 mL/min — ABNORMAL LOW (ref >90)
GFR calc non Af Amer: 6 mL/min — ABNORMAL LOW (ref >90)
Glucose, Bld: 91 mg/dL (ref 70–99)
Potassium: 4.2 mEq/L (ref 3.7–5.3)
Sodium: 139 mEq/L (ref 137–147)
Total Bilirubin: 0.4 mg/dL (ref 0.3–1.2)
Total Protein: 6.1 g/dL (ref 6.0–8.3)

## 2013-11-29 LAB — LACTATE DEHYDROGENASE: LDH: 251 U/L — ABNORMAL HIGH (ref 94–250)

## 2013-11-29 LAB — FOLATE: Folate: 4.4 ng/mL

## 2013-11-29 LAB — VITAMIN B12: Vitamin B-12: 314 pg/mL (ref 211–911)

## 2013-11-29 MED ORDER — HEPARIN SODIUM (PORCINE) 1000 UNIT/ML IJ SOLN
2000.0000 [IU] | Freq: Once | INTRAMUSCULAR | Status: AC
  Administered 2013-11-29: 2000 [IU] via INTRAVENOUS

## 2013-11-29 MED ORDER — OXYCODONE HCL 5 MG PO TABS
ORAL_TABLET | ORAL | Status: AC
  Filled 2013-11-29: qty 1

## 2013-11-29 MED ORDER — ANTICOAGULANT SODIUM CITRATE 4% (200MG/5ML) IV SOLN
5.0000 mL | Freq: Once | Status: AC
  Administered 2013-12-04: 5 mL via INTRAVENOUS
  Filled 2013-11-29 (×2): qty 250

## 2013-11-29 MED ORDER — DEXTROSE 5 % IV SOLN
1.5000 g | INTRAVENOUS | Status: AC
  Administered 2013-12-03: 1.5 g via INTRAVENOUS
  Filled 2013-11-29 (×2): qty 1.5

## 2013-11-29 MED ORDER — DARBEPOETIN ALFA-POLYSORBATE 40 MCG/0.4ML IJ SOLN
40.0000 ug | INTRAMUSCULAR | Status: DC
  Administered 2013-12-01: 40 ug via INTRAVENOUS
  Filled 2013-11-29: qty 0.4

## 2013-11-29 MED ORDER — METHYLPREDNISOLONE SODIUM SUCC 125 MG IJ SOLR
60.0000 mg | Freq: Every day | INTRAMUSCULAR | Status: DC
  Administered 2013-11-29 – 2013-12-02 (×4): 60 mg via INTRAVENOUS
  Filled 2013-11-29 (×5): qty 0.96
  Filled 2013-11-29: qty 2
  Filled 2013-11-29 (×2): qty 0.96

## 2013-11-29 NOTE — Progress Notes (Addendum)
[facility] PROGRESS NOTE  Interim History: 53 y.o. year-old patient with hx of CKD, hep C and HTN. She presents with progressive anorexia, loss of taste, leg and facial swelling, loss of energy,ED today creat is 14. In 2011 it was 0.72. In June last year is was 2.5, in Jan this year creat was 2.96 and in March creat was up to 3.6, no NSAID's due to PUD. Renal US showed Chronic medical renal disease. no neprhrotoxins , ACEi or contrast.     Assessment/Plan:   AKI (acute kidney injury) - renal on board, has right subclavian dialysis catheter placed by IR on 11/27/2013, started on dialysis by renal. - Unclear etiology of AKI there are no nephrotoxin drugs, ? Due Hep C. - Renal following.     HTN (hypertension): - Stable off meds monitor     Severe thrombocytopenia happened early morning 11/29/2013 baseline platelet level was above 182 two days ago.   Etiology unclear, not on any heparin/Lovenox for prophylaxis, clinical picture does not look consistent with TTP or H. [place], however cannot completely rule out, has some heparin exposure possibly during dialysis for the last 2 days hence have ordered HIT panel although too soon for that, have ordered LDH-heptoglobin and peripheral smear. Will request hematology to evaluate. Currently no signs of bleeding.      Anemia of chronic disease:  No signs of overt bleeding, stool occult blood pending as patient is constipated without any p.m., anemia panel is inconclusive, she status post 2 units of packed RBC transfusion on 11/28/2013 with stable H&H post transfusion. Have also ordered LDH, haptoglobin, peripheral smear. We'll continue to monitor H&H. On PPI.       Cirrhosis of liver without mention of alcohol, suggested by abdominal US/ Hepatitis C antibody test positive - not on treatment. We'll have her follow with GI and ID post discharge.      Rash/itching: - Likely due to uremia much improved with supportive  care. - cont atarax for itching, rash most likely from scrathing.    Persistent tachycardia.  Due to severe anemia, TSH stable, no shortness of breath, tachycardia resolved after transfusion.    Constipation (had diarrhea upon admission which is completely resolved)  On bowel regimen.     Code Status: full Family Communication: none  Disposition Plan: inpatient   Consultants:  Renal, hematology  Procedures:  Hd cath 6.30.2015, echogram with EF 60% and LVH.   Echo  - Mild LVH with LVEF [id number], grade 1 diastolic dysfunction. Trivial mitral and aortic regurgitation. Mildly dilated left atrium. Unable to assess PASP.    Antibiotics:  None  HPI/Subjective: Tired sleepy  Objective: Filed Vitals:   11/29/13 0730 11/29/13 0800 11/29/13 0810 11/29/13 0830  BP: 120/92 89/60 139/58 96/66  Pulse: 102 110 110 94  Temp:      TempSrc:      Resp:      Height:      Weight:      SpO2:        Intake/Output Summary (Last 24 hours) at 11/29/13 0857 Last data filed at 11/28/13 2030  Gross per 24 hour  Intake 1442.5 ml  Output   -148 ml  Net 1590.5 ml   Filed Weights   11/28/13 1350 11/29/13 0416 11/29/13 0707  Weight: 55.4 kg (122 lb 2.2 oz) 56.2 kg (123 lb 14.4 oz) 57.1 kg (125 lb 14.1 oz)    Exam:  General: Alert, awake, oriented x3, in no acute distress.  HEENT: No  bruits, no goiter. +JVD Heart: Regular rate and rhythm,  Lungs: Good air movement,clear.  Abdomen: Soft, nontender, nondistended, positive bowel sounds.  Neuro: Grossly intact, nonfocal.   Data Reviewed: Basic Metabolic Panel:  Recent Labs Lab 11/26/13 1452 11/27/13 0300 11/28/13 0135 11/29/13 0300  NA 138 141 138 139  K 4.0 4.5 3.2* 4.2  CL 104 109 101 101  CO2 9* 9* 17* 21  GLUCOSE 78 122* 101* 91  BUN 82* 82* 56* 35*  CREATININE 14.19* 14.16* 9.80* 6.62*  CALCIUM 7.1* 6.7* 6.8* 7.1*  PHOS  --  9.9*  --   --    Liver Function Tests:  Recent Labs Lab 11/26/13 1452  11/27/13 0300 11/28/13 0135 11/29/13 0300  AST 19 17 12 19   ALT 16 15 10 10   ALKPHOS 81 109 64 66  BILITOT 0.2* 0.2* 0.2* 0.4  PROT 6.8 6.2 5.5* 6.1  ALBUMIN 2.5* 2.4* 2.0* 2.2*   No results found for this basename: LIPASE, AMYLASE,  in the last 168 hours No results found for this basename: AMMONIA,  in the last 168 hours CBC:  Recent Labs Lab 11/27/13 0300 11/27/13 1353 11/28/13 0135 11/28/13 2200 11/29/13 0110 11/29/13 0300  WBC 7.9 10.2 10.4 12.7* 11.8*  --   HGB 6.9* 7.3* 6.2* 9.0* 8.2* 9.3*  HCT 19.6* 20.9* 17.9* 25.2* 22.9* 26.6*  MCV 89.1 89.7 87.7 87.8 86.1  --   PLT 164 171 81* 30* 29*  --    Cardiac Enzymes:  Recent Labs Lab 11/26/13 2005 11/27/13 0654  TROPONINI <0.30 <0.30   BNP (last 3 results)  Recent Labs  11/26/13 1732  PROBNP 11948.0*   CBG:  Recent Labs Lab 11/26/13 2056  GLUCAP 93    Recent Results (from the past 240 hour(s))  MRSA PCR SCREENING     Status: None   Collection Time    11/26/13  9:20 PM      Result Value Ref Range Status   MRSA by PCR NEGATIVE  NEGATIVE Final   Comment:            The GeneXpert MRSA Assay (FDA     approved for NASAL specimens     only), is one component of a     comprehensive MRSA colonization     surveillance program. It is not     intended to diagnose MRSA     infection nor to guide or     monitor treatment for     MRSA infections.     Studies: Ir Fluoro Guide Cv Line Right  11/27/2013   INDICATION: Acute on chronic kidney disease.  Patient is starting dialysis.  EXAM: FLUOROSCOPIC AND ULTRASOUND GUIDED PLACEMENT OF A TUNNELED DIALYSIS CATHETER  Physician: Stephan Minister. Henn, MD  FLUOROSCOPY TIME:  36 seconds  MEDICATIONS: 2 g Ancef, 2 mg versed, 100 mcg fentanyl. A radiology nurse monitored the patient for moderate sedation. As antibiotic prophylaxis, Ancef was ordered pre-procedure and administered intravenously within one hour of incision.  ANESTHESIA/SEDATION: Moderate sedation time: 25 minutes   PROCEDURE: Informed consent was obtained for placement of a tunneled dialysis catheter. The patient was placed supine on the interventional table. Ultrasound confirmed a patent right internal jugularvein. Ultrasound images were obtained for documentation. The right side of the neck was prepped and draped in a sterile fashion. The right side of the neck was anesthetized with 1% lidocaine. Maximal barrier sterile technique was utilized including caps, mask, sterile gowns, sterile gloves, sterile drape, hand hygiene and skin  antiseptic. A small incision was made with #11 blade scalpel. A 21 gauge needle directed into the right internal jugular vein with ultrasound guidance. A micropuncture dilator set was placed. A 19 cm tip to cuff HemoSplit catheter was selected. The skin below the right clavicle was anesthetized and a small incision was made with an #11 blade scalpel. A subcutaneous tunnel was formed to the vein dermatotomy site. The catheter was brought through the tunnel. The vein dermatotomy site was dilated to accommodate a peel-away sheath. The catheter was placed through the peel-away sheath and directed into the central venous structures. The tip of the catheter was placed in the lower SVC with fluoroscopy. Fluoroscopic images were obtained for documentation. Both lumens were found to aspirate and flush well. The proper amount of heparin was flushed in both lumens. The vein dermatotomy site was closed using a single layer of absorbable suture and Dermabond. The catheter was secured to the skin using Prolene suture. Gel-Foam was placed within the subcutaneous tract.  FINDINGS: Catheter tip in the lower SVC. Patient developed transient tachycardia following the catheter placement. The heart rhythm appeared to be regular with a rate between 130-140 bpm. The tachycardia lasted a couple of minutes then the heart rate returned to baseline. Patient was asymptomatic during this transient episode of tachycardia.   COMPLICATIONS: None  IMPRESSION: Successful placement of a right internal jugular tunneled dialysis catheter using ultrasound and fluoroscopic guidance.   Electronically Signed   By: Markus Daft M.D.   On: 11/27/2013 11:46   Ir US Guide Vasc Access Right  11/27/2013   INDICATION: Acute on chronic kidney disease.  Patient is starting dialysis.  EXAM: FLUOROSCOPIC AND ULTRASOUND GUIDED PLACEMENT OF A TUNNELED DIALYSIS CATHETER  Physician: Stephan Minister. Henn, MD  FLUOROSCOPY TIME:  36 seconds  MEDICATIONS: 2 g Ancef, 2 mg versed, 100 mcg fentanyl. A radiology nurse monitored the patient for moderate sedation. As antibiotic prophylaxis, Ancef was ordered pre-procedure and administered intravenously within one hour of incision.  ANESTHESIA/SEDATION: Moderate sedation time: 25 minutes  PROCEDURE: Informed consent was obtained for placement of a tunneled dialysis catheter. The patient was placed supine on the interventional table. Ultrasound confirmed a patent right internal jugularvein. Ultrasound images were obtained for documentation. The right side of the neck was prepped and draped in a sterile fashion. The right side of the neck was anesthetized with 1% lidocaine. Maximal barrier sterile technique was utilized including caps, mask, sterile gowns, sterile gloves, sterile drape, hand hygiene and skin antiseptic. A small incision was made with #11 blade scalpel. A 21 gauge needle directed into the right internal jugular vein with ultrasound guidance. A micropuncture dilator set was placed. A 19 cm tip to cuff HemoSplit catheter was selected. The skin below the right clavicle was anesthetized and a small incision was made with an #11 blade scalpel. A subcutaneous tunnel was formed to the vein dermatotomy site. The catheter was brought through the tunnel. The vein dermatotomy site was dilated to accommodate a peel-away sheath. The catheter was placed through the peel-away sheath and directed into the central venous  structures. The tip of the catheter was placed in the lower SVC with fluoroscopy. Fluoroscopic images were obtained for documentation. Both lumens were found to aspirate and flush well. The proper amount of heparin was flushed in both lumens. The vein dermatotomy site was closed using a single layer of absorbable suture and Dermabond. The catheter was secured to the skin using Prolene suture. Gel-Foam was placed within the  subcutaneous tract.  FINDINGS: Catheter tip in the lower SVC. Patient developed transient tachycardia following the catheter placement. The heart rhythm appeared to be regular with a rate between 130-140 bpm. The tachycardia lasted a couple of minutes then the heart rate returned to baseline. Patient was asymptomatic during this transient episode of tachycardia.  COMPLICATIONS: None  IMPRESSION: Successful placement of a right internal jugular tunneled dialysis catheter using ultrasound and fluoroscopic guidance.   Electronically Signed   By: Markus Daft M.D.   On: 11/27/2013 11:46    Scheduled Meds: . bisacodyl  10 mg Rectal Daily  . darbepoetin (ARANESP) injection - DIALYSIS  40 mcg Intravenous Q Wed-HD  . docusate sodium  200 mg Oral BID  . feeding supplement (NEPRO CARB STEADY)  237 mL Oral BID BM  . pantoprazole (PROTONIX) IV  40 mg Intravenous Q12H  . polyethylene glycol  17 g Oral BID  . sodium chloride  3 mL Intravenous Q12H   Continuous Infusions:    Thurnell Lose  [facility] Pager [phone]. If 8PM-8AM, please contact night-coverage at [email or web address], password [facility] 11/29/2013, 8:57 AM  LOS: 3 days      **Disclaimer: This note may have been dictated with voice recognition software. Similar sounding words can inadvertently be transcribed and this note may contain transcription errors which may not have been corrected upon publication of note.**

## 2013-11-29 NOTE — Progress Notes (Signed)
Platelet count is dropping rapidly.  Patient did receive IV heparin 2000 units with HD on 6/30 and 7/1, unfortunately this is not recorded on the [facility] due to a procedural issue which we will correct.    Kelly Splinter MD (pgr) [phone]    ([phone] 11/29/2013, 9:04 AM

## 2013-11-29 NOTE — Progress Notes (Signed)
CCMD notified RN regarding pt's HR.  HR in 130's to 140's for about a minute.  No complaints of chest pain, no shortness of breath.  Notified MD on call.  No new orders.  Will continue to monitor.

## 2013-11-29 NOTE — Progress Notes (Signed)
Pt had a Platelet count of 29. Provider on call notified. Awaiting response. Day Shift RN aware. Will to monitor.

## 2013-11-29 NOTE — Progress Notes (Signed)
[facility] Progress Note   Subjective: No fluid off w HD today, did not tolerate.  Sharp drop in platelet count, seen by Hematology. No bleeding. States LE and facial edema improving.  Filed Vitals:   11/29/13 0930 11/29/13 1000 11/29/13 1010 11/29/13 1200  BP: 92/64 107/73 104/75 124/93  Pulse: 84 95 86 115  Temp:   98 F (36.7 C) 98.1 F (36.7 C)  TempSrc:   Oral Oral  Resp:   18 17  Height:      Weight:   57 kg (125 lb 10.6 oz)   SpO2:   100% 100%   Exam: Thin AAF no distress  Mild JVD  Coarse rhonchi L >R RRR 2/6 SEM RUSB  Abd soft, NTND, no mass, no ascites  SCDs present. 2+ pitting edema to LLE/ 1+ on right Neuro is nf, Ox 3   UA >300 protein, tntc WBC, 3-6 rbc, many bact  CXR COPD , no acute disease Renal US 9 cm kidneys, markedly echogenic  Assessment:  1. 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 Next HD Saturday. 2. Thrombocytopenia - acute process, unclear cause; will hold all heparin products and remove heparin block from catheter and replace with citrate. Hematology has seen. Avoiding unnecessary medications. HIT panel and platelet transfusion pending. 3. HTN/Volume - SBPs 110s-120s on hydralazine. LE edema and rhonchi on exam. Monitor volume status 4. Anemia - Hgb 9.2. Aranesp 40 ordered q Sat. Tsat 29%. Ferritin 357. Transfusion planned 5. Sec HPT - Ca 7.1 (8.5 corrected). Phos 9.9. Check iPTH next HD. 6. Hep C diagnosed in 2011  7. Hx GIBs  8. Tobacco / COPD by cxr 9. HD access - Using Rt TDC. Vein mapping complete per VVS. Poor UE veins bilaterally.  Plan Rt AVG on 7/6. 10. Dispo - CLIP process initiated. No heparin HD and no heparin cath block for now.   Sonnie Alamo PA-C [facility]   11/29/2013, 1:34 PM   Pt seen, examined and agree w A/P as above.  Kelly Splinter MD pager [phone]    cell [phone] 11/29/2013, 5:04 PM     Recent Labs Lab 11/27/13 0300  11/28/13 0135 11/29/13 0300  NA 141 138 139  K 4.5 3.2* 4.2  CL 109 101 101  CO2 9* 17* 21  GLUCOSE 122* 101* 91  BUN 82* 56* 35*  CREATININE 14.16* 9.80* 6.62*  CALCIUM 6.7* 6.8* 7.1*  PHOS 9.9*  --   --     Recent Labs Lab 11/27/13 0300 11/28/13 0135 11/29/13 0300  AST 17 12 19   ALT 15 10 10   ALKPHOS 109 64 66  BILITOT 0.2* 0.2* 0.4  PROT 6.2 5.5* 6.1  ALBUMIN 2.4* 2.0* 2.2*    Recent Labs Lab 11/29/13 0110 11/29/13 0300 11/29/13 0847 11/29/13 1245  WBC 11.8*  --  9.3 15.4*  HGB 8.2* 9.3* 8.3* 9.2*  HCT 22.9* 26.6* 23.3* 26.3*  MCV 86.1  --  86.0 86.5  PLT 29*  --  12* 15*   . bisacodyl  10 mg Rectal Daily  . darbepoetin (ARANESP) injection - DIALYSIS  40 mcg Intravenous Q Wed-HD  . docusate sodium  200 mg Oral BID  . feeding supplement (NEPRO CARB STEADY)  237 mL Oral BID BM  . methylPREDNISolone (SOLU-MEDROL) injection  60 mg Intravenous Daily  . pantoprazole (PROTONIX) IV  40 mg Intravenous Q12H  . polyethylene glycol  17 g Oral BID  .  sodium chloride  3 mL Intravenous Q12H     acetaminophen, hydrOXYzine, metoprolol, ondansetron (ZOFRAN) IV, zolpidem

## 2013-11-29 NOTE — Consult Note (Signed)
Referring MD:   PCP:  No PCP Per Patient   Reason for Referral: Rapidly progressive thrombocytopenia   Chief Complaint  Patient presents with  . Rash  . Leg Swelling  . Facial Swelling    HPI: Pleasant 53 year old woman with essential hypertension who has been on minimal medications. She moved here from [place] a few years ago. She has been hospitalized 3 times prior to the current admission. Initial admission in June 2011 for an Escherichia coli urinary tract infection treated with Rocephin and then oral Cipro. Hemoglobin 14.6, MCV 100, post hydration hemoglobin 12.5. Platelet count recorded as low as 99,000 serum creatinine 0.7-0.9. She was hospitalized again in June of 2014 with acute abdominal pain. Rule out pancreatitis. CT scan of the abdomen showed changes in the liver compatible with cirrhosis. Gastric wall thickening. Platelet count as low as 99,000. Diagnosis of hepatitis C genotype 1B made at that time with high viral load 1.6 million. She was admitted again in January of this year with hematemesis and findings of a gastric ulcer felt to be related to nonsteroidal anti-inflammatory use. Creatinine 2.9. Hemoglobin 11.1. Lowest platelet count 119,000 on January 8. Recovered to 179,000  recorded on 08/09/2013.  She was started on hydralazine to control blood pressure. She was specifically told to avoid nonsteroidal anti-inflammatories and ACE inhibitors back in January.  She now presents with a one month history of nonbloody diarrhea up to 5 times daily without any abdominal pain, nausea, vomiting, or fever. No dysuria, urgency, frequency, or hematuria. Over the last 2 weeks she has developed progressive weakness, dyspnea on mild exertion, facial and leg swelling, and a pruritic rash on her anterior and posterior chest. She presented to the emergency department on June 29 with these complaints and is now found to have marked deterioration of her renal function with a creatinine  of 14, BUN 82, bicarbonate 9, anion gap 15. Not unexpectedly, hemoglobin decreased to 7.3 g. Initial platelet count 171,000. There has been a rapid fall over the last 48 hours down to current value of 12,000.  She has been afebrile. She has received minimal in the way of medications. She received a dose of cefazolin following placement of a vascular dialysis catheter, a dose of Aranesp, one dose of hydralazine which has been stopped, one dose of Solu-Medrol, some sedatives at time of vascular catheter placement fentanyl and Versed, Atarax for pruritus. She is currently on Lopressor, and Protonix. She received a few small doses of unfractionated heparin. She has had no recent heparin exposure.  She denies any prior blood transfusions, she denied any intravenous drug use or tattoos. She was told about 10 years ago that she might have lupus. Her mother had lupus. Her father died at age 68 of an MI. No other family members with known blood disorder. She has 2 sisters and a brother who are healthy. 2 daughters and a son who are healthy. She has had no recent infectious exposures. She started no new medications.         Past Medical History  Diagnosis Date  . Asthma   . Hepatitis C antibody test positive   . Hypertension     "just dx'd today" (11/03/2012)  . Anginal pain   : gastric ulcer nonsteroidal related January 2015. No diabetes. History of a positive PPD. Negative chest x-ray. No clinical hepatitis. She denies yellow jaundice or mononucleosis. No history of seizure, stroke, thyroid disease. She has small joint arthritis in her hands.   Past  Surgical History  Procedure Laterality Date  . Abdominal hysterectomy      "partial" (11/03/2012)  . Esophagogastroduodenoscopy N/A 06/05/2013    Procedure: ESOPHAGOGASTRODUODENOSCOPY (EGD);  Surgeon: Winfield Cunas., MD;  Location: [facility];  Service: Endoscopy;  Laterality: N/A;  :  . bisacodyl  10 mg Rectal Daily  . darbepoetin (ARANESP)  injection - DIALYSIS  40 mcg Intravenous Q Wed-HD  . docusate sodium  200 mg Oral BID  . feeding supplement (NEPRO CARB STEADY)  237 mL Oral BID BM  . pantoprazole (PROTONIX) IV  40 mg Intravenous Q12H  . polyethylene glycol  17 g Oral BID  . sodium chloride  3 mL Intravenous Q12H  :  No Known Allergies:   family history. See history of present illness.:  History   Social History  . Marital Status: Married    Spouse Name: N/A    Number of Children:  3   . Years of Education: N/A   Occupational History  . Not on file.   Social History Main Topics  . Smoking status: Current Every Day Smoker -- 0.50 packs/day for 20 years    Types: Cigarettes  . Smokeless tobacco: Never Used  . Alcohol Use: 27.6 oz/week    46 Cans of beer per week     Comment: 11/03/2012 "1-2 40oz beers/day maybe"  . Drug Use: Yes    Special: Marijuana     Comment: 11/03/2012 "maybe once or twice/wk"  . Sexual Activity:    Other Topics Concern  . Not on file   Social History Narrative  . No narrative on file  :  ROS: Eyes:She denies any double vision or blurred vision  Throat: No sore throat  Neck:No stiff neck  Resp:  No cough.  Cardio: Positive dyspnea on mild exertion. Intermittent chest pain and palpitations.  GI:No abdominal pain. Diarrhea as noted above  Extremities: Extremity edema as noted above  Lymph nodes: No swollen glands  Neurologic:  No headache. She does get some intermittent paresthesias of her hands.  Skin: .She's noted some bumps on the posterior scalp  Genitourinary:  see above  Vitals: Filed Vitals:   11/29/13 1200  BP: 124/93  Pulse: 115  Temp: 98.1 F (36.7 C)  Resp: 17    PHYSICAL EXAM: General appearance:Well-nourished African American woman  HEENT:Pharynx no erythema or exudate. Poor dentition multiple rotten teeth broken off at the bases. No oral ulcers or mass.  Lymph Nodes:No cervical, supraclavicular, or axillary adenopathy.  Resp: Lungs clear to auscultation  and resonant to percussion throughout  Cardio: Regular rhythm 3/6 systolic murmur at the left sternal border, no gallop or rub  Vascular:Dialysis catheter right subclavian position; dorsalis pedis pulses 2+ symmetric. Carotids 1+ symmetric. No bruits.  Breasts:Not examined  GI: Abdomen soft, nontender, no mass, no organomegaly  GU: Extremities:1+ pedal edema. Toenails are painted. Neurologic: She is awake alert and oriented, arcus senilis, pupils round reactive to light. Cranial nerves grossly normal. Motor strength 5 over 5. Reflexes 2+ symmetric not tested at the right biceps due to an intravenous line in the antecubital fossa.  Skin:Psoriatic dermatitis on the scalp with possible  Small subcutaneous abscess ease. Very fine sandpaper quality rash on the anterior chest and back.   Labs:   Recent Labs  11/29/13 0110 11/29/13 0300 11/29/13 0847  WBC 11.8*  --  9.3  HGB 8.2* 9.3* 8.3*  HCT 22.9* 26.6* 23.3*  PLT 29*  --  12*    Recent Labs  11/28/13 0135 11/29/13 0300  NA 138 139  K 3.2* 4.2  CL 101 101  CO2 17* 21  GLUCOSE 101* 91  BUN 56* 35*  CREATININE 9.80* 6.62*  CALCIUM 6.8* 7.1*   LDH 251, bilirubin 0.4, reticulocyte count 1.6%  Blood smear review: Macrocytic red cells. I viewed over 30 high powered fields and saw only a single schistocytes. There is 1+ polychromasia. No spherocytes. No Red cell inclusions. Neutrophils and platelets appear mature. Platelets markedly decreased one per high power field.   Assessment: Active Problems:   Anemia of chronic disease   Hepatitis C antibody test positive   Cirrhosis of liver without mention of alcohol, suggested by abdominal US   HTN (hypertension)   AKI (acute kidney injury)   Malnutrition of moderate degree   Thrombocytopenia  Impression: Unexplained rapid fall in platelet count occurring within 24 hours of hospital admission with baseline normal count. Intermittent low platelet counts in the past at time of acute  illness but never below 99,000. She has received minimal medications during this admission. There is no evidence for a microangiopathic process based on my review of the peripheral blood film and ancillary laboratory studies. (No evidence for TTP or HUS). Although  an occasional patient can have a rapid fall in platelet count following heparin administration if previously sensitized, the rapid, and dramatic fall in her platelet count over such a short interval next the pretest probability of heparin-induced thrombocytopenia unlikely.  Recommendation: Diagnosis discussed with the patient and her husband at length.  In view of acute renal failure and likelihood of associated uremic platelet dysfunction and current platelet count down to 12,000, I would recommend a platelet transfusion. She is agreeable to a transfusion. I agree with withholding all heparin products including flushes. In view of the diarrhea, I agree with stool cultures and also checking stool for the Escherichia coli 0 :157 strain that is associated with HUS although as noted above, there is no evidence at this point that she has a microangiopathic process. I would stop all nonessential medications. I can't rule out an atypical reaction to cefazolin or Protonix. She only received one dose of the cefazolin. She has been on a PPI for a number of months now without any problem and a normal platelet count on admission making this a less likely offender. I do not think it is unreasonable to continue parenteral Solu-Medrol until we have more information. We should not need more than 1 mg per kilogram. I do not think that we need to start her on a direct thrombin inhibitor in view of low pretest probability that this is a heparin-related event.    Recommendation:    Annia Belt 11/29/2013, 12:42 PM

## 2013-11-29 NOTE — Progress Notes (Signed)
CRITICAL VALUE ALERT  Critical value received:  Platelet Count 29  Date of notification:  11/29/13  Time of notification:  0132  Critical value read back: yes  Nurse who received alert:  Leary Roca, RN  MD notified (1st page): Tylene Fantasia, NP  Time of first page: [phone]  MD notified (2nd page):  Time of second page: [phone]  Responding MD:    Time MD responded:

## 2013-11-30 LAB — PREPARE PLATELET PHERESIS: Unit division: 0

## 2013-11-30 LAB — FECAL LACTOFERRIN, QUANT: Fecal Lactoferrin: NEGATIVE

## 2013-11-30 LAB — CBC WITH DIFFERENTIAL/PLATELET
Basophils Absolute: 0 10*3/uL (ref 0.0–0.1)
Basophils Relative: 0 % (ref 0–1)
Eosinophils Absolute: 0 10*3/uL (ref 0.0–0.7)
Eosinophils Relative: 0 % (ref 0–5)
HCT: 25.2 % — ABNORMAL LOW (ref 36.0–46.0)
Hemoglobin: 8.7 g/dL — ABNORMAL LOW (ref 12.0–15.0)
Lymphocytes Relative: 13 % (ref 12–46)
Lymphs Abs: 1.3 10*3/uL (ref 0.7–4.0)
MCH: 31.1 pg (ref 26.0–34.0)
MCHC: 34.5 g/dL (ref 30.0–36.0)
MCV: 90 fL (ref 78.0–100.0)
Monocytes Absolute: 0.3 10*3/uL (ref 0.1–1.0)
Monocytes Relative: 3 % (ref 3–12)
Neutro Abs: 8.3 10*3/uL — ABNORMAL HIGH (ref 1.7–7.7)
Neutrophils Relative %: 84 % — ABNORMAL HIGH (ref 43–77)
Platelets: 26 10*3/uL — CL (ref 150–400)
RBC: 2.8 MIL/uL — ABNORMAL LOW (ref 3.87–5.11)
RDW: 14.6 % (ref 11.5–15.5)
WBC: 9.9 10*3/uL (ref 4.0–10.5)

## 2013-11-30 LAB — CLOSTRIDIUM DIFFICILE BY PCR: Toxigenic C. Difficile by PCR: NEGATIVE

## 2013-11-30 LAB — CBC
HCT: 22.4 % — ABNORMAL LOW (ref 36.0–46.0)
Hemoglobin: 7.8 g/dL — ABNORMAL LOW (ref 12.0–15.0)
MCH: 30.4 pg (ref 26.0–34.0)
MCHC: 34.8 g/dL (ref 30.0–36.0)
MCV: 87.2 fL (ref 78.0–100.0)
Platelets: 25 10*3/uL — CL (ref 150–400)
RBC: 2.57 MIL/uL — ABNORMAL LOW (ref 3.87–5.11)
RDW: 14.3 % (ref 11.5–15.5)
WBC: 9.6 10*3/uL (ref 4.0–10.5)

## 2013-11-30 LAB — HAPTOGLOBIN: Haptoglobin: <25 mg/dL — ABNORMAL LOW (ref 45–215)

## 2013-11-30 LAB — FOLATE RBC: RBC Folate: 602 ng/mL — ABNORMAL HIGH (ref >280)

## 2013-11-30 MED ORDER — AMLODIPINE BESYLATE 10 MG PO TABS
10.0000 mg | ORAL_TABLET | Freq: Every day | ORAL | Status: DC
  Administered 2013-11-30 – 2013-12-06 (×5): 10 mg via ORAL
  Filled 2013-11-30 (×8): qty 1

## 2013-11-30 MED ORDER — METOPROLOL TARTRATE 50 MG PO TABS
50.0000 mg | ORAL_TABLET | Freq: Two times a day (BID) | ORAL | Status: DC
  Administered 2013-11-30 – 2013-12-04 (×7): 50 mg via ORAL
  Filled 2013-11-30 (×11): qty 1

## 2013-11-30 MED ORDER — METOPROLOL TARTRATE 25 MG PO TABS
25.0000 mg | ORAL_TABLET | Freq: Two times a day (BID) | ORAL | Status: DC
  Administered 2013-11-30: 25 mg via ORAL
  Filled 2013-11-30 (×2): qty 1

## 2013-11-30 NOTE — Progress Notes (Signed)
Minimal improvement in platelet count with a platelet transfusion rising from 12,000 to 25,000. Hemoglobin stable following initial 2 unit transfusion given on admission. 8.7 g today. No active bleeding. Subjectively she feels better on dialysis. No longer uremic. Haptoglobin is low but may not be accurate in somebody with liver disease. Other parameters do not suggest a hemolytic process. See my consult note from July 2. Impression: Idiopathic, rapid, a drop in platelet count Holding heparin and non essential medications. Waiting for results on heparin thrombocytopenia assay but low pretest probability that this is in fact heparin induced thrombocytopenia. Rec: I would continue to check daily blood counts. Transfuse platelets if count falls below 20,000 or any active bleeding. Discussed with hospitalist. I can be reached at [phone] for any questions or concerns.

## 2013-11-30 NOTE — Progress Notes (Signed)
Utilization review completed. Verdie Barrows, RN, BSN. 

## 2013-11-30 NOTE — Progress Notes (Addendum)
[facility] PROGRESS NOTE  Interim History: 53 y.o. year-old patient with hx of CKD, hep C and HTN. She presents with progressive anorexia, loss of taste, leg and facial swelling, loss of energy,ED today creat is 14. In 2011 it was 0.72. In June last year is was 2.5, in Jan this year creat was 2.96 and in March creat was up to 3.6, no NSAID's due to PUD. Renal US showed Chronic medical renal disease. no neprhrotoxins , ACEi or contrast.     Assessment/Plan:   AKI (acute kidney injury) - renal on board, has right subclavian dialysis catheter placed by IR on 11/27/2013, started on dialysis by renal. - Unclear etiology of AKI there are no nephrotoxin drugs, ? Due Hep C. renal following.     Severe thrombocytopenia happened early morning 11/29/2013 baseline platelet level was above 182 two days ago.   Etiology unclear, not on any heparin/Lovenox for prophylaxis, clinical picture does not look consistent with TTP or HUS, however cannot completely rule out, has some heparin exposure possibly during dialysis during her first 2 days hence have ordered HIT panel , noted LDH-heptoglobin and peripheral smear. No signs of bleeding.  Discussed her case with Dr. Beryle Beams who has provided hematology consultation, continue supportive care with IV Solu-Medrol, she status post one pack platelet transfusion on 11-2013 goal to keep her platelets above 20,000. Per hematology not consistent with ITP, TTP or HUS. Likely combination of acute renal failure, hepatitis C and possibly some viral infection. Minimize Meds.      Anemia of chronic disease:  No signs of overt bleeding, stool occult blood pending as patient is constipated without any p.m., anemia panel is inconclusive, she status post 2 units of packed RBC transfusion on 11/28/2013 with stable H&H post transfusion. Have also ordered LDH, haptoglobin, peripheral smear. We'll continue to monitor H&H. On PPI.   Occult Blood -ve, DC  PPI.     Cirrhosis of liver without mention of alcohol, suggested by abdominal US/ Hepatitis C antibody test positive - not on treatment. We'll have her follow with GI and ID post discharge. -Discussed her case with ID physician Dr. Linus Salmons no indication for acute hep C treatment      Rash/itching: - Likely due to uremia now resolved.     HTN (hypertension): - Stable , placed on low-dose beta blocker.      Persistent tachycardia.  Due to severe anemia, TSH stable, no shortness of breath, tachycardia resolved after transfusion.       Constipation (had diarrhea upon admission which is completely resolved)  Resolved after bowel regimen.       Code Status: full Family Communication: none  Disposition Plan: inpatient   Consultants:  Renal, hematology  Procedures:  Hd cath 6.30.2015, echogram with EF 60% and LVH.   Echo  - Mild LVH with LVEF [id number], grade 1 diastolic dysfunction. Trivial mitral and aortic regurgitation. Mildly dilated left atrium. Unable to assess PASP.    Antibiotics:  None  HPI/Subjective: Tired sleepy  Objective: Filed Vitals:   11/29/13 1721 11/29/13 1831 11/29/13 2136 11/30/13 0535  BP: 134/89 119/79 130/88 130/89  Pulse: 97 101 86 102  Temp: 98.3 F (36.8 C) 98.5 F (36.9 C) 98.3 F (36.8 C) 98.6 F (37 C)  TempSrc: Oral Oral Oral Oral  Resp: 12 12 16 18   Height:      Weight:   58.3 kg (128 lb 8.5 oz) 58.9 kg (129 lb 13.6 oz)  SpO2: 97% 98% 98% 100%  Intake/Output Summary (Last 24 hours) at 11/30/13 1002 Last data filed at 11/30/13 0900  Gross per 24 hour  Intake  984.5 ml  Output    300 ml  Net  684.5 ml   Filed Weights   11/29/13 1010 11/29/13 2136 11/30/13 0535  Weight: 57 kg (125 lb 10.6 oz) 58.3 kg (128 lb 8.5 oz) 58.9 kg (129 lb 13.6 oz)    Exam:  General: Alert, awake, oriented x3, in no acute distress.  HEENT: No bruits, no goiter. +JVD Heart: Regular rate and rhythm,  Lungs: Good air  movement,clear.  Abdomen: Soft, nontender, nondistended, positive bowel sounds.  Neuro: Grossly intact, nonfocal.   Data Reviewed: Basic Metabolic Panel:  Recent Labs Lab 11/26/13 1452 11/27/13 0300 11/28/13 0135 11/29/13 0300  NA 138 141 138 139  K 4.0 4.5 3.2* 4.2  CL 104 109 101 101  CO2 9* 9* 17* 21  GLUCOSE 78 122* 101* 91  BUN 82* 82* 56* 35*  CREATININE 14.19* 14.16* 9.80* 6.62*  CALCIUM 7.1* 6.7* 6.8* 7.1*  PHOS  --  9.9*  --   --    Liver Function Tests:  Recent Labs Lab 11/26/13 1452 11/27/13 0300 11/28/13 0135 11/29/13 0300  AST 19 17 12 19   ALT 16 15 10 10   ALKPHOS 81 109 64 66  BILITOT 0.2* 0.2* 0.2* 0.4  PROT 6.8 6.2 5.5* 6.1  ALBUMIN 2.5* 2.4* 2.0* 2.2*   No results found for this basename: LIPASE, AMYLASE,  in the last 168 hours No results found for this basename: AMMONIA,  in the last 168 hours CBC:  Recent Labs Lab 11/29/13 0110 11/29/13 0300 11/29/13 0847 11/29/13 1245 11/30/13 0200 11/30/13 0830  WBC 11.8*  --  9.3 15.4* 9.6 9.9  NEUTROABS  --   --   --   --   --  8.3*  HGB 8.2* 9.3* 8.3* 9.2* 7.8* 8.7*  HCT 22.9* 26.6* 23.3* 26.3* 22.4* 25.2*  MCV 86.1  --  86.0 86.5 87.2 90.0  PLT 29*  --  12* 15* 25* 26*   Cardiac Enzymes:  Recent Labs Lab 11/26/13 2005 11/27/13 0654  TROPONINI <0.30 <0.30   BNP (last 3 results)  Recent Labs  11/26/13 1732  PROBNP 11948.0*   CBG:  Recent Labs Lab 11/26/13 2056  GLUCAP 93    Recent Results (from the past 240 hour(s))  MRSA PCR SCREENING     Status: None   Collection Time    11/26/13  9:20 PM      Result Value Ref Range Status   MRSA by PCR NEGATIVE  NEGATIVE Final   Comment:            The GeneXpert MRSA Assay (FDA     approved for NASAL specimens     only), is one component of a     comprehensive MRSA colonization     surveillance program. It is not     intended to diagnose MRSA     infection nor to guide or     monitor treatment for     MRSA infections.    CLOSTRIDIUM DIFFICILE BY PCR     Status: None   Collection Time    11/29/13 10:40 PM      Result Value Ref Range Status   C difficile by pcr NEGATIVE  NEGATIVE Final     Studies: No results found.  Scheduled Meds: . anticoagulant sodium citrate  5 mL Intravenous Once  . bisacodyl  10 mg  Rectal Daily  . [START ON 12/03/2013] cefUROXime (ZINACEF)  IV  1.5 g Intravenous On Call to OR  . [START ON 12/01/2013] darbepoetin (ARANESP) injection - DIALYSIS  40 mcg Intravenous Q Sat-HD  . docusate sodium  200 mg Oral BID  . feeding supplement (NEPRO CARB STEADY)  237 mL Oral BID BM  . methylPREDNISolone (SOLU-MEDROL) injection  60 mg Intravenous Daily  . metoprolol tartrate  25 mg Oral BID  . pantoprazole (PROTONIX) IV  40 mg Intravenous Q12H  . polyethylene glycol  17 g Oral BID  . sodium chloride  3 mL Intravenous Q12H   Continuous Infusions:    [facility] Pager [phone]. If 8PM-8AM, please contact night-coverage at [email or web address], password [facility] 11/30/2013, 10:02 AM  LOS: 4 days      **Disclaimer: This note may have been dictated with voice recognition software. Similar sounding words can inadvertently be transcribed and this note may contain transcription errors which may not have been corrected upon publication of note.**

## 2013-11-30 NOTE — Progress Notes (Signed)
  [facility] Progress Note   Subjective: Getting back her appetite, taste and energy.  Plts up to 25k today, she did get a packed of plts IV yesterday  Filed Vitals:   11/29/13 1831 11/29/13 2136 11/30/13 0535 11/30/13 1030  BP: 119/79 130/88 130/89 150/104  Pulse: 101 86 102 88  Temp: 98.5 F (36.9 C) 98.3 F (36.8 C) 98.6 F (37 C) 98.6 F (37 C)  TempSrc: Oral Oral Oral Oral  Resp: 12 16 18 19   Height:      Weight:  58.3 kg (128 lb 8.5 oz) 58.9 kg (129 lb 13.6 oz)   SpO2: 98% 98% 100% 100%   Exam: Thin AAF no distress, pleasant Mild JVD  Chest clear bilat RRR 2/6 SEM RUSB  Abd soft, NTND, no mass, no ascites  No LE edema or UE edema Neuro is nf, Ox 3   UA >300 protein, tntc WBC, 3-6 rbc, many bact  CXR COPD , no acute disease Renal US 9 cm kidneys, markedly echogenic  Assessment:  1. CKD stage V- new start to dialysis, s/p HD x 3. CLIP process started, [facility] consulted for access, see below 2. Thrombocytopenia - acute process, unclear cause; will hold all heparin products and remove heparin block from catheter and replace with citrate. Hematology has seen. Avoiding unnecessary medications. HIT panel and platelet transfusion pending. Transfused plts on 7/2 3. HTN/Volume - SBPs 110s-120s on hydralazine. Not tolerating attempts to remove fluid.  4. Anemia - Hgb 9.2. Aranesp 40 ordered q Sat. Tsat 29%. Ferritin 357. Transfused 2u prbc's.  5. Sec HPT - Ca 7.1 (8.5 corrected). Phos 9.9. Check iPTH next HD. 6. Hep C diagnosed in 2011  7. Hx GIBs  8. Tobacco / COPD by cxr 9. HD access - Using Rt TDC. Vein mapping complete per VVS. Poor UE veins bilaterally.  Plan Rt AVG on 7/6    Plan-- HD tomorrow  Kelly Splinter MD pager [phone]    cell [phone] 11/29/2013, 5:04 PM     Recent Labs Lab 11/27/13 0300 11/28/13 0135 11/29/13 0300  NA 141 138 139  K 4.5 3.2* 4.2  CL 109 101 101  CO2 9* 17* 21  GLUCOSE 122* 101* 91  BUN 82* 56* 35*  CREATININE  14.16* 9.80* 6.62*  CALCIUM 6.7* 6.8* 7.1*  PHOS 9.9*  --   --     Recent Labs Lab 11/27/13 0300 11/28/13 0135 11/29/13 0300  AST 17 12 19   ALT 15 10 10   ALKPHOS 109 64 66  BILITOT 0.2* 0.2* 0.4  PROT 6.2 5.5* 6.1  ALBUMIN 2.4* 2.0* 2.2*    Recent Labs Lab 11/29/13 1245 11/30/13 0200 11/30/13 0830  WBC 15.4* 9.6 9.9  NEUTROABS  --   --  8.3*  HGB 9.2* 7.8* 8.7*  HCT 26.3* 22.4* 25.2*  MCV 86.5 87.2 90.0  PLT 15* 25* 26*   . anticoagulant sodium citrate  5 mL Intravenous Once  . [START ON 12/03/2013] cefUROXime (ZINACEF)  IV  1.5 g Intravenous On Call to OR  . [START ON 12/01/2013] darbepoetin (ARANESP) injection - DIALYSIS  40 mcg Intravenous Q Sat-HD  . feeding supplement (NEPRO CARB STEADY)  237 mL Oral BID BM  . methylPREDNISolone (SOLU-MEDROL) injection  60 mg Intravenous Daily  . metoprolol tartrate  25 mg Oral BID  . sodium chloride  3 mL Intravenous Q12H     acetaminophen, metoprolol, zolpidem

## 2013-12-01 LAB — CBC WITH DIFFERENTIAL/PLATELET
Basophils Absolute: 0 10*3/uL (ref 0.0–0.1)
Basophils Relative: 0 % (ref 0–1)
Eosinophils Absolute: 0 10*3/uL (ref 0.0–0.7)
Eosinophils Relative: 0 % (ref 0–5)
HCT: 22.1 % — ABNORMAL LOW (ref 36.0–46.0)
Hemoglobin: 7.6 g/dL — ABNORMAL LOW (ref 12.0–15.0)
Lymphocytes Relative: 15 % (ref 12–46)
Lymphs Abs: 1.8 10*3/uL (ref 0.7–4.0)
MCH: 30.6 pg (ref 26.0–34.0)
MCHC: 34.4 g/dL (ref 30.0–36.0)
MCV: 89.1 fL (ref 78.0–100.0)
Monocytes Absolute: 0.5 10*3/uL (ref 0.1–1.0)
Monocytes Relative: 4 % (ref 3–12)
Neutro Abs: 9.5 10*3/uL — ABNORMAL HIGH (ref 1.7–7.7)
Neutrophils Relative %: 80 % — ABNORMAL HIGH (ref 43–77)
Platelets: 48 10*3/uL — ABNORMAL LOW (ref 150–400)
RBC: 2.48 MIL/uL — ABNORMAL LOW (ref 3.87–5.11)
RDW: 14.3 % (ref 11.5–15.5)
WBC: 11.9 10*3/uL — ABNORMAL HIGH (ref 4.0–10.5)

## 2013-12-01 LAB — COMPREHENSIVE METABOLIC PANEL
ALT: 9 U/L (ref 0–35)
AST: 16 U/L (ref 0–37)
Albumin: 2.2 g/dL — ABNORMAL LOW (ref 3.5–5.2)
Alkaline Phosphatase: 57 U/L (ref 39–117)
Anion gap: 12 (ref 5–15)
BUN: 38 mg/dL — ABNORMAL HIGH (ref 6–23)
CO2: 25 mEq/L (ref 19–32)
Calcium: 7 mg/dL — ABNORMAL LOW (ref 8.4–10.5)
Chloride: 99 mEq/L (ref 96–112)
Creatinine, Ser: 6 mg/dL — ABNORMAL HIGH (ref 0.50–1.10)
GFR calc Af Amer: 8 mL/min — ABNORMAL LOW (ref >90)
GFR calc non Af Amer: 7 mL/min — ABNORMAL LOW (ref >90)
Glucose, Bld: 93 mg/dL (ref 70–99)
Potassium: 4.1 mEq/L (ref 3.7–5.3)
Sodium: 136 mEq/L — ABNORMAL LOW (ref 137–147)
Total Bilirubin: <0.2 mg/dL — ABNORMAL LOW (ref 0.3–1.2)
Total Protein: 5.5 g/dL — ABNORMAL LOW (ref 6.0–8.3)

## 2013-12-01 LAB — GLUCOSE, CAPILLARY: Glucose-Capillary: 88 mg/dL (ref 70–99)

## 2013-12-01 MED ORDER — DARBEPOETIN ALFA-POLYSORBATE 40 MCG/0.4ML IJ SOLN
INTRAMUSCULAR | Status: AC
  Filled 2013-12-01: qty 0.4

## 2013-12-01 MED ORDER — ANTICOAGULANT SODIUM CITRATE 4% (200MG/5ML) IV SOLN
5.0000 mL | Freq: Once | Status: AC
  Administered 2013-12-01: 5 mL via INTRAVENOUS
  Filled 2013-12-01: qty 250

## 2013-12-01 NOTE — Procedures (Signed)
I was present at this dialysis session, have reviewed the session itself and made  appropriate changes  Kelly Splinter MD (pgr) [phone]    ([phone] 12/01/2013, 12:19 PM

## 2013-12-01 NOTE — Progress Notes (Signed)
[facility] PROGRESS NOTE  Interim History: 53 y.o. year-old patient with hx of CKD, hep C and HTN. She presents with progressive anorexia, loss of taste, leg and facial swelling, loss of energy,ED today creat is 14. In 2011 it was 0.72. In June last year is was 2.5, in Jan this year creat was 2.96 and in March creat was up to 3.6, no NSAID's due to PUD. Renal US showed Chronic medical renal disease. no neprhrotoxins , ACEi or contrast.     HPI/Subjective:   In bed, no headache, no chest-Abd pain, no SOB, no focal weakness, itching resolved, no bleeding or bruising     Assessment/Plan:    AKI (acute kidney injury) - renal on board, has right subclavian dialysis catheter placed by IR on 11/27/2013, started on dialysis by renal. - Unclear etiology of AKI there are no nephrotoxin drugs, ? Due Hep C. renal following.     Severe thrombocytopenia happened early morning 11/29/2013 baseline platelet level was above 182 two days ago.   Etiology unclear, not on any heparin/Lovenox for prophylaxis, clinical picture does not look consistent with TTP or HUS, however cannot completely rule out, has some heparin exposure possibly during dialysis during her first 2 days hence have ordered HIT panel , noted LDH-heptoglobin and peripheral smear. No signs of bleeding.  Discussed her case with Dr. Beryle Beams who has provided hematology consultation, continue supportive care with IV Solu-Medrol, she status post one pack platelet transfusion on 11-2013 goal to keep her platelets above 20,000. Per hematology not consistent with ITP, TTP or HUS. Likely combination of acute renal failure, hepatitis C and possibly some viral infection. Minimized Meds. Counts improving.      Anemia of chronic disease:  No signs of overt bleeding, stool occult blood pending as patient is constipated without any p.m., anemia panel is inconclusive, she status post 2 units of packed RBC transfusion on 11/28/2013  with stable H&H post transfusion. Have also ordered LDH, haptoglobin, peripheral smear. We'll continue to monitor H&H. On PPI.   Occult Blood -ve, DC'd PPI.     Cirrhosis of liver without mention of alcohol, suggested by abdominal US/ Hepatitis C antibody test positive - not on treatment. We'll have her follow with GI and ID post discharge. -Discussed her case with ID physician Dr. Linus Salmons no indication for acute hep C treatment      Rash/itching: - Likely due to uremia now resolved.     HTN (hypertension): - Stable , placed on low-dose beta blocker.     Persistent tachycardia.  Due to severe anemia, TSH stable, no shortness of breath, tachycardia resolved after transfusion.       Constipation (had diarrhea upon admission which is completely resolved)  Resolved after bowel regimen.       Code Status: full Family Communication: none  Disposition Plan: inpatient   Consultants:  Renal, hematology  Procedures:    HD cath 6.30.2015 Rt IJ. TDC, Echogram with EF 60% and LVH.        Echo  -  Mild LVH with LVEF Q000111Q, grade 1 diastolic dysfunction. Trivial mitral and aortic regurgitation. Mildly dilated left atrium. Unable to assess PASP.    Antibiotics:  None     Objective: Filed Vitals:   11/30/13 1030 11/30/13 1850 11/30/13 2214 12/01/13 0500  BP: 150/104 158/95 140/92 121/82  Pulse: 88 87 72 80  Temp: 98.6 F (37 C) 98.6 F (37 C) 98.4 F (36.9 C) 98.3 F (36.8 C)  TempSrc: Oral  Oral Oral Oral  Resp: 19 16 16 18   Height:      Weight:    62.9 kg (138 lb 10.7 oz)  SpO2: 100% 98% 98% 98%    Intake/Output Summary (Last 24 hours) at 12/01/13 0744 Last data filed at 12/01/13 [id number]  Gross per 24 hour  Intake    723 ml  Output      0 ml  Net    723 ml   Filed Weights   11/29/13 2136 11/30/13 0535 12/01/13 0500  Weight: 58.3 kg (128 lb 8.5 oz) 58.9 kg (129 lb 13.6 oz) 62.9 kg (138 lb 10.7 oz)    Exam:  General: Alert, awake,  oriented x3, in no acute distress.  HEENT: No bruits, no goiter. +JVD Heart: Regular rate and rhythm,  Lungs: Good air movement,clear.  Abdomen: Soft, nontender, nondistended, positive bowel sounds.  Neuro: Grossly intact, nonfocal.   Data Reviewed: Basic Metabolic Panel:  Recent Labs Lab 11/26/13 1452 11/27/13 0300 11/28/13 0135 11/29/13 0300 12/01/13 0455  NA 138 141 138 139 136*  K 4.0 4.5 3.2* 4.2 4.1  CL 104 109 101 101 99  CO2 9* 9* 17* 21 25  GLUCOSE 78 122* 101* 91 93  BUN 82* 82* 56* 35* 38*  CREATININE 14.19* 14.16* 9.80* 6.62* 6.00*  CALCIUM 7.1* 6.7* 6.8* 7.1* 7.0*  PHOS  --  9.9*  --   --   --    Liver Function Tests:  Recent Labs Lab 11/26/13 1452 11/27/13 0300 11/28/13 0135 11/29/13 0300 12/01/13 0455  AST 19 17 12 19 16   ALT 16 15 10 10 9   ALKPHOS 81 109 64 66 57  BILITOT 0.2* 0.2* 0.2* 0.4 <0.2*  PROT 6.8 6.2 5.5* 6.1 5.5*  ALBUMIN 2.5* 2.4* 2.0* 2.2* 2.2*   No results found for this basename: LIPASE, AMYLASE,  in the last 168 hours No results found for this basename: AMMONIA,  in the last 168 hours CBC:  Recent Labs Lab 11/29/13 0847 11/29/13 1245 11/30/13 0200 11/30/13 0830 12/01/13 0455  WBC 9.3 15.4* 9.6 9.9 11.9*  NEUTROABS  --   --   --  8.3* 9.5*  HGB 8.3* 9.2* 7.8* 8.7* 7.6*  HCT 23.3* 26.3* 22.4* 25.2* 22.1*  MCV 86.0 86.5 87.2 90.0 89.1  PLT 12* 15* 25* 26* 48*   Cardiac Enzymes:  Recent Labs Lab 11/26/13 2005 11/27/13 0654  TROPONINI <0.30 <0.30   BNP (last 3 results)  Recent Labs  11/26/13 1732  PROBNP 11948.0*   CBG:  Recent Labs Lab 11/26/13 2056  GLUCAP 93    Recent Results (from the past 240 hour(s))  MRSA PCR SCREENING     Status: None   Collection Time    11/26/13  9:20 PM      Result Value Ref Range Status   MRSA by PCR NEGATIVE  NEGATIVE Final   Comment:            The GeneXpert MRSA Assay (FDA     approved for NASAL specimens     only), is one component of a     comprehensive MRSA  colonization     surveillance program. It is not     intended to diagnose MRSA     infection nor to guide or     monitor treatment for     MRSA infections.  CLOSTRIDIUM DIFFICILE BY PCR     Status: None   Collection Time    11/29/13 10:40 PM  Result Value Ref Range Status   C difficile by pcr NEGATIVE  NEGATIVE Final     Studies: No results found.  Scheduled Meds: . amLODipine  10 mg Oral Daily  . anticoagulant sodium citrate  5 mL Intravenous Once  . [START ON 12/03/2013] cefUROXime (ZINACEF)  IV  1.5 g Intravenous On Call to OR  . darbepoetin (ARANESP) injection - DIALYSIS  40 mcg Intravenous Q Sat-HD  . feeding supplement (NEPRO CARB STEADY)  237 mL Oral BID BM  . methylPREDNISolone (SOLU-MEDROL) injection  60 mg Intravenous Daily  . metoprolol tartrate  50 mg Oral BID  . sodium chloride  3 mL Intravenous Q12H   Continuous Infusions:    Thurnell Lose  [facility] Pager [phone]. If 8PM-8AM, please contact night-coverage at [email or web address], password [facility] 12/01/2013, 7:44 AM  LOS: 5 days      **Disclaimer: This note may have been dictated with voice recognition software. Similar sounding words can inadvertently be transcribed and this note may contain transcription errors which may not have been corrected upon publication of note.**

## 2013-12-01 NOTE — Progress Notes (Signed)
  [facility] Progress Note   Subjective: No new complaints, feeling better.  BP's high normal  Filed Vitals:   11/30/13 1850 11/30/13 2214 12/01/13 0500 12/01/13 1018  BP: 158/95 140/92 121/82 176/106  Pulse: 87 72 80 79  Temp: 98.6 F (37 C) 98.4 F (36.9 C) 98.3 F (36.8 C) 98.1 F (36.7 C)  TempSrc: Oral Oral Oral Oral  Resp: 16 16 18 20   Height:      Weight:   62.9 kg (138 lb 10.7 oz)   SpO2: 98% 98% 98% 100%   Exam: Thin AAF no distress, pleasant Mild JVD  Chest clear bilat RRR 2/6 SEM RUSB  Abd soft, NTND, no mass, no ascites  No LE edema or UE edema Neuro is nf, Ox 3   UA >300 protein, tntc WBC, 3-6 rbc, many bact  CXR COPD , no acute disease Renal US 9 cm kidneys, markedly echogenic  Assessment:  1. CKD stage V- new start to dialysis, s/p HD x 3. CLIP process started, [facility] consulted for access, see below 2. Thrombocytopenia - acute process, unclear cause, hx cirrhosis but plts were normal on admission. HIT panel pending. Transfused plts on 7/2. Hematology following. 3. HTN/Volume - BP high normal.  Did not tolerate attempts at fluid removal with tachycardia and hypotension initially, so her BP is being treated now w norvasc and MTP. Wt / volume are stable. 4. Anemia - Hgb 9.2. Aranesp 40 ordered q Sat. Tsat 29%. Ferritin 357. Transfused 2u prbc's.  5. Sec HPT - Ca 7.1 (8.5 corrected). Phos 9.9. Check iPTH next HD. 6. Hep C diagnosed in 2011  7. Hx GIBs  8. Tobacco / COPD by cxr 9. HD access - Using Rt TDC. Vein mapping complete per VVS. Poor UE veins bilaterally.  Plan Rt AVG on 7/6    Plan-- HD today  Kelly Splinter MD pager [phone]    cell [phone] 11/29/2013, 5:04 PM     Recent Labs Lab 11/27/13 0300 11/28/13 0135 11/29/13 0300 12/01/13 0455  NA 141 138 139 136*  K 4.5 3.2* 4.2 4.1  CL 109 101 101 99  CO2 9* 17* 21 25  GLUCOSE 122* 101* 91 93  BUN 82* 56* 35* 38*  CREATININE 14.16* 9.80* 6.62* 6.00*  CALCIUM 6.7* 6.8* 7.1*  7.0*  PHOS 9.9*  --   --   --     Recent Labs Lab 11/28/13 0135 11/29/13 0300 12/01/13 0455  AST 12 19 16   ALT 10 10 9   ALKPHOS 64 66 57  BILITOT 0.2* 0.4 <0.2*  PROT 5.5* 6.1 5.5*  ALBUMIN 2.0* 2.2* 2.2*    Recent Labs Lab 11/30/13 0200 11/30/13 0830 12/01/13 0455  WBC 9.6 9.9 11.9*  NEUTROABS  --  8.3* 9.5*  HGB 7.8* 8.7* 7.6*  HCT 22.4* 25.2* 22.1*  MCV 87.2 90.0 89.1  PLT 25* 26* 48*   . amLODipine  10 mg Oral Daily  . anticoagulant sodium citrate  5 mL Intravenous Once  . [START ON 12/03/2013] cefUROXime (ZINACEF)  IV  1.5 g Intravenous On Call to OR  . darbepoetin (ARANESP) injection - DIALYSIS  40 mcg Intravenous Q Sat-HD  . feeding supplement (NEPRO CARB STEADY)  237 mL Oral BID BM  . methylPREDNISolone (SOLU-MEDROL) injection  60 mg Intravenous Daily  . metoprolol tartrate  50 mg Oral BID  . sodium chloride  3 mL Intravenous Q12H     acetaminophen, metoprolol, zolpidem

## 2013-12-02 LAB — COMPREHENSIVE METABOLIC PANEL
ALT: 13 U/L (ref 0–35)
AST: 26 U/L (ref 0–37)
Albumin: 2 g/dL — ABNORMAL LOW (ref 3.5–5.2)
Alkaline Phosphatase: 64 U/L (ref 39–117)
Anion gap: 12 (ref 5–15)
BUN: 25 mg/dL — ABNORMAL HIGH (ref 6–23)
CO2: 28 mEq/L (ref 19–32)
Calcium: 7 mg/dL — ABNORMAL LOW (ref 8.4–10.5)
Chloride: 99 mEq/L (ref 96–112)
Creatinine, Ser: 3.76 mg/dL — ABNORMAL HIGH (ref 0.50–1.10)
GFR calc Af Amer: 15 mL/min — ABNORMAL LOW (ref >90)
GFR calc non Af Amer: 13 mL/min — ABNORMAL LOW (ref >90)
Glucose, Bld: 73 mg/dL (ref 70–99)
Potassium: 3.9 mEq/L (ref 3.7–5.3)
Sodium: 139 mEq/L (ref 137–147)
Total Bilirubin: <0.2 mg/dL — ABNORMAL LOW (ref 0.3–1.2)
Total Protein: 5.3 g/dL — ABNORMAL LOW (ref 6.0–8.3)

## 2013-12-02 LAB — GLUCOSE, CAPILLARY: Glucose-Capillary: 82 mg/dL (ref 70–99)

## 2013-12-02 LAB — CBC WITH DIFFERENTIAL/PLATELET
Basophils Absolute: 0 10*3/uL (ref 0.0–0.1)
Basophils Relative: 0 % (ref 0–1)
Eosinophils Absolute: 0.1 10*3/uL (ref 0.0–0.7)
Eosinophils Relative: 1 % (ref 0–5)
HCT: 22.9 % — ABNORMAL LOW (ref 36.0–46.0)
Hemoglobin: 7.8 g/dL — ABNORMAL LOW (ref 12.0–15.0)
Lymphocytes Relative: 23 % (ref 12–46)
Lymphs Abs: 2.4 10*3/uL (ref 0.7–4.0)
MCH: 30.8 pg (ref 26.0–34.0)
MCHC: 34.1 g/dL (ref 30.0–36.0)
MCV: 90.5 fL (ref 78.0–100.0)
Monocytes Absolute: 0.6 10*3/uL (ref 0.1–1.0)
Monocytes Relative: 5 % (ref 3–12)
Neutro Abs: 7.5 10*3/uL (ref 1.7–7.7)
Neutrophils Relative %: 71 % (ref 43–77)
Platelets: 47 10*3/uL — ABNORMAL LOW (ref 150–400)
RBC: 2.53 MIL/uL — ABNORMAL LOW (ref 3.87–5.11)
RDW: 14.2 % (ref 11.5–15.5)
WBC: 10.6 10*3/uL — ABNORMAL HIGH (ref 4.0–10.5)

## 2013-12-02 NOTE — Progress Notes (Signed)
   Case d/w Dr. Jonnie Finner.  Pt's thrombocytopenia of unknown etiology.  In Dr. Barkley Bruns opinion, he feels she need access placement this admission as she is now ESRD.  As her thrombocytopenia has been stable since her transfusion, he feels the active process has ceased.      CBC    Component Value Date/Time   WBC 10.6* 12/02/2013 0432   RBC 2.53* 12/02/2013 0432   RBC 2.90* 11/28/2013 2200   HGB 7.8* 12/02/2013 0432   HCT 22.9* 12/02/2013 0432   PLT 47* 12/02/2013 0432   MCV 90.5 12/02/2013 0432   MCH 30.8 12/02/2013 0432   MCHC 34.1 12/02/2013 0432   RDW 14.2 12/02/2013 0432   LYMPHSABS 2.4 12/02/2013 0432   MONOABS 0.6 12/02/2013 0432   EOSABS 0.1 12/02/2013 0432   BASOSABS 0.0 12/02/2013 0432   - to facilitate procedure tomorrow, I am going to transfused 2 u Plt (2 6-pk) tonight - as long as plt count remains >60K, will proceed with AVF/AVG placement tomorrow  Adele Barthel, MD [facility] [place] Office: [phone] Pager: [phone]  12/02/2013, 5:48 PM

## 2013-12-02 NOTE — Progress Notes (Signed)
[facility] PROGRESS NOTE  Interim History: 53 y.o. year-old patient with hx of CKD, hep C and HTN. She presents with progressive anorexia, loss of taste, leg and facial swelling, loss of energy,ED today creat is 14. In 2011 it was 0.72. In June last year is was 2.5, in Jan this year creat was 2.96 and in March creat was up to 3.6, no NSAID's due to PUD. Renal US showed Chronic medical renal disease. no neprhrotoxins , ACEi or contrast.  During her hospital stay patient was initiated on dialysis, she is close to being ESRD, she required packed RBC transfusions for her anemia which appeared to be anemia of chronic disease, she also developed severe thrombocytopenia requiring platelet transfusion x1, etiology of thrombocytopenia is unclear, hematology following the patient she seems to be responding to IV steroids. Clinically TTP, ITP, HUS have been ruled out.       HPI/Subjective:   In bed, no headache, no chest-Abd pain, no SOB, no focal weakness,  itching resolved, no bleeding or bruising     Assessment/Plan:    AKI (acute kidney injury) - renal on board, has right subclavian dialysis catheter placed by IR on 11/27/2013, started on dialysis by renal. - Unclear etiology of AKI there are no nephrotoxin drugs, ? Due Hep C. renal following.     Severe thrombocytopenia happened early morning 11/29/2013 baseline platelet level was above 182 two days ago.   Etiology unclear, not on any heparin/Lovenox for prophylaxis, clinical picture does not look consistent with TTP or HUS, however cannot completely rule out, has some heparin exposure possibly during dialysis during her first 2 days hence have ordered HIT panel , noted LDH-heptoglobin and peripheral smear. No signs of bleeding.  Discussed her case with Dr. Beryle Beams who has provided hematology consultation, continue supportive care with IV Solu-Medrol, she status post one pack platelet transfusion on 11-2013 goal to keep her  platelets above 20,000. Per hematology not consistent with ITP, TTP or HUS. Likely combination of acute renal failure, hepatitis C and possibly some viral infection. Minimized Meds. Counts improving.      Anemia of chronic disease:  No signs of overt bleeding, stool occult blood pending as patient is constipated without any p.m., anemia panel is inconclusive, she status post 2 units of packed RBC transfusion on 11/28/2013 with stable H&H post transfusion. Have also ordered LDH, haptoglobin, peripheral smear. We'll continue to monitor H&H. On PPI.   Occult Blood -ve, DC'd PPI.     Cirrhosis of liver without mention of alcohol, suggested by abdominal US/ Hepatitis C antibody test positive - not on treatment. We'll have her follow with GI and ID post discharge. Discussed her case with ID physician Dr. Linus Salmons no indication for acute hep C treatment      Rash/itching: - Likely due to uremia now resolved.     HTN (hypertension): - Stable , placed on low-dose beta blocker.    Intermittent tachycardia.  Initially was persistent due to severe anemia, now resting tachycardia has resolved, TSH stable, echogram unremarkable, no shortness of breath.  Still gets episodes of tachycardia upon exertion, likely due to deconditioning, will have her sit in the chair and daytime and increase her ambulation with assistance. Monitor on telemetry.     Constipation (had diarrhea upon admission which is completely resolved)  Resolved after bowel regimen.       Code Status: full Family Communication: none  Disposition Plan: inpatient     Consultants:  Renal, hematology  Procedures:    HD cath 6.30.2015 Rt IJ. TDC, Echogram with EF 60% and LVH.   Echo  -  Mild LVH with LVEF Q000111Q, grade 1 diastolic dysfunction. Trivial mitral and aortic regurgitation. Mildly dilated left atrium. Unable to assess PASP.    Antibiotics:  None     Objective: Filed Vitals:   12/01/13  1530 12/01/13 1623 12/01/13 2100 12/02/13 0500  BP: 120/88 151/84 126/75 133/95  Pulse: 58 87 80 90  Temp: 98.4 F (36.9 C) 98.1 F (36.7 C) 98.9 F (37.2 C) 98.8 F (37.1 C)  TempSrc: Oral Oral Oral Oral  Resp: 19 20 18 18   Height:      Weight: 59.4 kg (130 lb 15.3 oz)  59 kg (130 lb 1.1 oz)   SpO2:  96% 98% 100%    Intake/Output Summary (Last 24 hours) at 12/02/13 0906 Last data filed at 12/01/13 1838  Gross per 24 hour  Intake    240 ml  Output      0 ml  Net    240 ml   Filed Weights   12/01/13 1157 12/01/13 1530 12/01/13 2100  Weight: 58.6 kg (129 lb 3 oz) 59.4 kg (130 lb 15.3 oz) 59 kg (130 lb 1.1 oz)    Exam:  General: Alert, awake, oriented x3, in no acute distress.  HEENT: No bruits, no goiter. +JVD Heart: Regular rate and rhythm,  Lungs: Good air movement,clear.  Abdomen: Soft, nontender, nondistended, positive bowel sounds.  Neuro: Grossly intact, nonfocal.   Data Reviewed: Basic Metabolic Panel:  Recent Labs Lab 11/27/13 0300 11/28/13 0135 11/29/13 0300 12/01/13 0455 12/02/13 0432  NA 141 138 139 136* 139  K 4.5 3.2* 4.2 4.1 3.9  CL 109 101 101 99 99  CO2 9* 17* 21 25 28   GLUCOSE 122* 101* 91 93 73  BUN 82* 56* 35* 38* 25*  CREATININE 14.16* 9.80* 6.62* 6.00* 3.76*  CALCIUM 6.7* 6.8* 7.1* 7.0* 7.0*  PHOS 9.9*  --   --   --   --    Liver Function Tests:  Recent Labs Lab 11/27/13 0300 11/28/13 0135 11/29/13 0300 12/01/13 0455 12/02/13 0432  AST 17 12 19 16 26   ALT 15 10 10 9 13   ALKPHOS 109 64 66 57 64  BILITOT 0.2* 0.2* 0.4 <0.2* <0.2*  PROT 6.2 5.5* 6.1 5.5* 5.3*  ALBUMIN 2.4* 2.0* 2.2* 2.2* 2.0*   No results found for this basename: LIPASE, AMYLASE,  in the last 168 hours No results found for this basename: AMMONIA,  in the last 168 hours CBC:  Recent Labs Lab 11/29/13 1245 11/30/13 0200 11/30/13 0830 12/01/13 0455 12/02/13 0432  WBC 15.4* 9.6 9.9 11.9* 10.6*  NEUTROABS  --   --  8.3* 9.5* 7.5  HGB 9.2* 7.8* 8.7* 7.6*  7.8*  HCT 26.3* 22.4* 25.2* 22.1* 22.9*  MCV 86.5 87.2 90.0 89.1 90.5  PLT 15* 25* 26* 48* 47*   Cardiac Enzymes:  Recent Labs Lab 11/26/13 2005 11/27/13 0654  TROPONINI <0.30 <0.30   BNP (last 3 results)  Recent Labs  11/26/13 1732  PROBNP 11948.0*   CBG:  Recent Labs Lab 11/26/13 2056 12/01/13 1620  GLUCAP 93 88    Recent Results (from the past 240 hour(s))  MRSA PCR SCREENING     Status: None   Collection Time    11/26/13  9:20 PM      Result Value Ref Range Status   MRSA by PCR NEGATIVE  NEGATIVE Final  Comment:            The GeneXpert MRSA Assay (FDA     approved for NASAL specimens     only), is one component of a     comprehensive MRSA colonization     surveillance program. It is not     intended to diagnose MRSA     infection nor to guide or     monitor treatment for     MRSA infections.  CLOSTRIDIUM DIFFICILE BY PCR     Status: None   Collection Time    11/29/13 10:40 PM      Result Value Ref Range Status   C difficile by pcr NEGATIVE  NEGATIVE Final  STOOL CULTURE     Status: None   Collection Time    11/29/13 10:40 PM      Result Value Ref Range Status   Specimen Description STOOL   Final   Special Requests NONE   Final   Culture     Final   Value: NO SUSPICIOUS COLONIES, CONTINUING TO HOLD     Performed at [facility]   Report Status PENDING   Incomplete     Studies: No results found.  Scheduled Meds: . amLODipine  10 mg Oral Daily  . anticoagulant sodium citrate  5 mL Intravenous Once  . [START ON 12/03/2013] cefUROXime (ZINACEF)  IV  1.5 g Intravenous On Call to OR  . darbepoetin (ARANESP) injection - DIALYSIS  40 mcg Intravenous Q Sat-HD  . feeding supplement (NEPRO CARB STEADY)  237 mL Oral BID BM  . methylPREDNISolone (SOLU-MEDROL) injection  60 mg Intravenous Daily  . metoprolol tartrate  50 mg Oral BID  . sodium chloride  3 mL Intravenous Q12H   Continuous Infusions:    Thurnell Lose  [facility] Pager [phone]. If 8PM-8AM, please contact night-coverage at [email or web address], password [facility] 12/02/2013, 9:06 AM  LOS: 6 days      **Disclaimer: This note may have been dictated with voice recognition software. Similar sounding words can inadvertently be transcribed and this note may contain transcription errors which may not have been corrected upon publication of note.**

## 2013-12-02 NOTE — Progress Notes (Signed)
Patient ambulated length of hallway with no assistive devices.  Patient denied shortness of breath or dizziness.  Patient HR maintained between 80-95 bpm.  Will continue to monitor.

## 2013-12-02 NOTE — Progress Notes (Signed)
[facility] Progress Note   Subjective: No new complaints. Plts at 47k today.   Filed Vitals:   12/01/13 1623 12/01/13 2100 12/02/13 0500 12/02/13 0940  BP: 151/84 126/75 133/95 149/95  Pulse: 87 80 90 88  Temp: 98.1 F (36.7 C) 98.9 F (37.2 C) 98.8 F (37.1 C) 98.3 F (36.8 C)  TempSrc: Oral Oral Oral Oral  Resp: 20 18 18 19   Height:      Weight:  59 kg (130 lb 1.1 oz)    SpO2: 96% 98% 100% 96%   Exam: Thin AAF no distress, pleasant Mild JVD  Chest clear bilat RRR 2/6 SEM RUSB  Abd soft, NTND, no mass, no ascites  No LE edema or UE edema Neuro is nf, Ox 3   UA >300 protein, tntc WBC, 3-6 rbc, many bact  CXR COPD , no acute disease Renal US 9 cm kidneys, markedly echogenic  Assessment:  1. CKD stage V- new start to dialysis, s/p HD x 4. CLIP process started, [facility] consulted for access, see below 2. Thrombocytopenia - acute process, unclear cause, hx cirrhosis but plts were normal on admission. HIT panel pending. Transfused plts on 7/2. Hematology following. 3. HTN/Volume - BP high normal.  Did not tolerate attempts at fluid removal initially with tachycardia and hypotension so her HTN is being treated w medication. Wt / volume are stable. 4. Anemia - Hgb 9.2. Aranesp 40 ordered q Sat. Tsat 29%. Ferritin 357. Transfused 2u prbc's.  5. Sec HPT - Ca 7.1 (8.5 corrected). Phos 9.9. Check iPTH next HD. 6. Hep C diagnosed in 2011 7. Tobacco / COPD by cxr 8. HD access - Using Rt TDC. Vein mapping complete per [facility]VS. Poor UE veins bilaterally.  Plan Rt AVG on 7/6 9. Hx GIB x 2    Plan-- is on schedule for perm access tomorrow, plan next HD on Tuesday, awaiting CLIP, otherwise ready to go after access in and platelet issue stabilizing.   Kelly Splinter MD pager [phone]    cell [phone] 11/29/2013, 5:04 PM     Recent Labs Lab 11/27/13 0300  11/29/13 0300 12/01/13 0455 12/02/13 0432  NA 141  < > 139 136* 139  K 4.5  < > 4.2 4.1 3.9  CL 109  < > 101 99  99  CO2 9*  < > 21 25 28   GLUCOSE 122*  < > 91 93 73  BUN 82*  < > 35* 38* 25*  CREATININE 14.16*  < > 6.62* 6.00* 3.76*  CALCIUM 6.7*  < > 7.1* 7.0* 7.0*  PHOS 9.9*  --   --   --   --   < > = values in this interval not displayed.  Recent Labs Lab 11/29/13 0300 12/01/13 0455 12/02/13 0432  AST 19 16 26   ALT 10 9 13   ALKPHOS 66 57 64  BILITOT 0.4 <0.2* <0.2*  PROT 6.1 5.5* 5.3*  ALBUMIN 2.2* 2.2* 2.0*    Recent Labs Lab 11/30/13 0830 12/01/13 0455 12/02/13 0432  WBC 9.9 11.9* 10.6*  NEUTROABS 8.3* 9.5* 7.5  HGB 8.7* 7.6* 7.8*  HCT 25.2* 22.1* 22.9*  MCV 90.0 89.1 90.5  PLT 26* 48* 47*   . amLODipine  10 mg Oral Daily  . anticoagulant sodium citrate  5 mL Intravenous Once  . [START ON 12/03/2013] cefUROXime (ZINACEF)  IV  1.5 g Intravenous On Call to OR  . darbepoetin (ARANESP) injection - DIALYSIS  40 mcg Intravenous Q Sat-HD  . feeding  supplement (NEPRO CARB STEADY)  237 mL Oral BID BM  . methylPREDNISolone (SOLU-MEDROL) injection  60 mg Intravenous Daily  . metoprolol tartrate  50 mg Oral BID  . sodium chloride  3 mL Intravenous Q12H     acetaminophen, metoprolol, zolpidem

## 2013-12-03 ENCOUNTER — Encounter (HOSPITAL_COMMUNITY)
Admission: EM | Disposition: A | Discharge status: Home / Self Care | Payer: Self-pay | Source: Home / Self Care | Attending: Internal Medicine

## 2013-12-03 ENCOUNTER — Inpatient Hospital Stay (HOSPITAL_COMMUNITY): Payer: Medicaid Other | Admitting: Anesthesiology

## 2013-12-03 ENCOUNTER — Ambulatory Visit: Payer: Self-pay | Admitting: Internal Medicine

## 2013-12-03 ENCOUNTER — Other Ambulatory Visit: Payer: Self-pay

## 2013-12-03 ENCOUNTER — Telehealth: Payer: Self-pay | Admitting: Vascular Surgery

## 2013-12-03 ENCOUNTER — Encounter (HOSPITAL_COMMUNITY): Payer: Self-pay | Admitting: Anesthesiology

## 2013-12-03 ENCOUNTER — Encounter (HOSPITAL_COMMUNITY): Payer: Medicaid Other | Admitting: Anesthesiology

## 2013-12-03 DIAGNOSIS — N186 End stage renal disease: Secondary | ICD-10-CM

## 2013-12-03 DIAGNOSIS — Z48812 Encounter for surgical aftercare following surgery on the circulatory system: Secondary | ICD-10-CM

## 2013-12-03 HISTORY — PX: AV FISTULA PLACEMENT: SHX1204

## 2013-12-03 LAB — COMPREHENSIVE METABOLIC PANEL
ALT: 15 U/L (ref 0–35)
AST: 21 U/L (ref 0–37)
Albumin: 2.3 g/dL — ABNORMAL LOW (ref 3.5–5.2)
Alkaline Phosphatase: 61 U/L (ref 39–117)
Anion gap: 15 (ref 5–15)
BUN: 35 mg/dL — ABNORMAL HIGH (ref 6–23)
CO2: 26 mEq/L (ref 19–32)
Calcium: 7 mg/dL — ABNORMAL LOW (ref 8.4–10.5)
Chloride: 98 mEq/L (ref 96–112)
Creatinine, Ser: 5.27 mg/dL — ABNORMAL HIGH (ref 0.50–1.10)
GFR calc Af Amer: 10 mL/min — ABNORMAL LOW (ref >90)
GFR calc non Af Amer: 8 mL/min — ABNORMAL LOW (ref >90)
Glucose, Bld: 80 mg/dL (ref 70–99)
Potassium: 3.5 mEq/L — ABNORMAL LOW (ref 3.7–5.3)
Sodium: 139 mEq/L (ref 137–147)
Total Bilirubin: 0.3 mg/dL (ref 0.3–1.2)
Total Protein: 5.6 g/dL — ABNORMAL LOW (ref 6.0–8.3)

## 2013-12-03 LAB — CBC WITH DIFFERENTIAL/PLATELET
Basophils Absolute: 0 10*3/uL (ref 0.0–0.1)
Basophils Relative: 0 % (ref 0–1)
Eosinophils Absolute: 0.2 10*3/uL (ref 0.0–0.7)
Eosinophils Relative: 2 % (ref 0–5)
HCT: 22.9 % — ABNORMAL LOW (ref 36.0–46.0)
Hemoglobin: 7.7 g/dL — ABNORMAL LOW (ref 12.0–15.0)
Lymphocytes Relative: 20 % (ref 12–46)
Lymphs Abs: 2 10*3/uL (ref 0.7–4.0)
MCH: 30.7 pg (ref 26.0–34.0)
MCHC: 33.6 g/dL (ref 30.0–36.0)
MCV: 91.2 fL (ref 78.0–100.0)
Monocytes Absolute: 0.5 10*3/uL (ref 0.1–1.0)
Monocytes Relative: 5 % (ref 3–12)
Neutro Abs: 7.4 10*3/uL (ref 1.7–7.7)
Neutrophils Relative %: 73 % (ref 43–77)
Platelets: 87 10*3/uL — ABNORMAL LOW (ref 150–400)
RBC: 2.51 MIL/uL — ABNORMAL LOW (ref 3.87–5.11)
RDW: 14.2 % (ref 11.5–15.5)
WBC: 10.1 10*3/uL (ref 4.0–10.5)

## 2013-12-03 LAB — PATHOLOGIST SMEAR REVIEW

## 2013-12-03 LAB — SURGICAL PCR SCREEN
MRSA, PCR: NEGATIVE
Staphylococcus aureus: POSITIVE — AB

## 2013-12-03 LAB — HEPARIN INDUCED THROMBOCYTOPENIA PNL
Heparin Induced Plt Ab: NEGATIVE
Patient O.D.: 0.025
UFH High Dose UFH H: 0 % Release
UFH Low Dose 0.1 IU/mL: 0 % Release
UFH Low Dose 0.5 IU/mL: 0 % Release
UFH SRA Result: NEGATIVE

## 2013-12-03 LAB — STOOL CULTURE

## 2013-12-03 LAB — GLUCOSE, CAPILLARY
Glucose-Capillary: 96 mg/dL (ref 70–99)
Glucose-Capillary: 94 mg/dL (ref 70–99)
Glucose-Capillary: 102 mg/dL — ABNORMAL HIGH (ref 70–99)

## 2013-12-03 LAB — PARATHYROID HORMONE, INTACT (NO CA): PTH: 691.8 pg/mL — ABNORMAL HIGH (ref 14.0–72.0)

## 2013-12-03 SURGERY — ARTERIOVENOUS (AV) FISTULA CREATION
Anesthesia: Monitor Anesthesia Care | Site: Arm Upper | Laterality: Right

## 2013-12-03 MED ORDER — SEVELAMER CARBONATE 800 MG PO TABS
2400.0000 mg | ORAL_TABLET | Freq: Three times a day (TID) | ORAL | Status: DC
  Administered 2013-12-03 – 2013-12-06 (×9): 2400 mg via ORAL
  Filled 2013-12-03 (×14): qty 3

## 2013-12-03 MED ORDER — FENTANYL CITRATE 0.05 MG/ML IJ SOLN
INTRAMUSCULAR | Status: AC
  Filled 2013-12-03: qty 5

## 2013-12-03 MED ORDER — SODIUM CHLORIDE 0.9 % IV SOLN
INTRAVENOUS | Status: DC | PRN
  Administered 2013-12-03: 09:00:00 via INTRAVENOUS

## 2013-12-03 MED ORDER — 0.9 % SODIUM CHLORIDE (POUR BTL) OPTIME
TOPICAL | Status: DC | PRN
  Administered 2013-12-03: 1000 mL

## 2013-12-03 MED ORDER — MIDAZOLAM HCL 2 MG/2ML IJ SOLN
INTRAMUSCULAR | Status: AC
  Filled 2013-12-03: qty 2

## 2013-12-03 MED ORDER — ONDANSETRON HCL 4 MG/2ML IJ SOLN
INTRAMUSCULAR | Status: DC | PRN
  Administered 2013-12-03: 4 mg via INTRAVENOUS

## 2013-12-03 MED ORDER — PROPOFOL 10 MG/ML IV BOLUS
INTRAVENOUS | Status: AC
  Filled 2013-12-03: qty 20

## 2013-12-03 MED ORDER — MIDAZOLAM HCL 5 MG/5ML IJ SOLN
INTRAMUSCULAR | Status: DC | PRN
  Administered 2013-12-03 (×2): 1 mg via INTRAVENOUS

## 2013-12-03 MED ORDER — PROPOFOL INFUSION 10 MG/ML OPTIME
INTRAVENOUS | Status: DC | PRN
  Administered 2013-12-03: 75 ug/kg/min via INTRAVENOUS

## 2013-12-03 MED ORDER — OXYCODONE HCL 5 MG PO TABS: 5.0000 mg | ORAL_TABLET | Freq: Once | ORAL | Status: AC | PRN

## 2013-12-03 MED ORDER — OXYCODONE HCL 5 MG/5ML PO SOLN: 5.0000 mg | Freq: Once | ORAL | Status: AC | PRN

## 2013-12-03 MED ORDER — LIDOCAINE-EPINEPHRINE 0.5 %-1:200000 IJ SOLN
INTRAMUSCULAR | Status: AC
  Filled 2013-12-03: qty 1

## 2013-12-03 MED ORDER — SODIUM CHLORIDE 0.9 % IV SOLN
INTRAVENOUS | Status: DC
  Administered 2013-12-03: 09:00:00 via INTRAVENOUS

## 2013-12-03 MED ORDER — METOCLOPRAMIDE HCL 5 MG/ML IJ SOLN: 10.0000 mg | Freq: Once | INTRAMUSCULAR | Status: AC | PRN

## 2013-12-03 MED ORDER — ONDANSETRON HCL 4 MG/2ML IJ SOLN
INTRAMUSCULAR | Status: AC
  Filled 2013-12-03: qty 2

## 2013-12-03 MED ORDER — LIDOCAINE-EPINEPHRINE 0.5 %-1:200000 IJ SOLN
INTRAMUSCULAR | Status: DC | PRN
  Administered 2013-12-03: 50 mL

## 2013-12-03 MED ORDER — FENTANYL CITRATE 0.05 MG/ML IJ SOLN: 25.0000 ug | INTRAMUSCULAR | Status: DC | PRN

## 2013-12-03 MED ORDER — HYDROCODONE-ACETAMINOPHEN 5-325 MG PO TABS
1.0000 | ORAL_TABLET | Freq: Four times a day (QID) | ORAL | Status: DC | PRN
  Administered 2013-12-03 – 2013-12-04 (×3): 1 via ORAL
  Filled 2013-12-03 (×3): qty 1

## 2013-12-03 MED ORDER — SODIUM CHLORIDE 0.9 % IR SOLN
Status: DC | PRN
  Administered 2013-12-03: 10:00:00

## 2013-12-03 MED ORDER — FENTANYL CITRATE 0.05 MG/ML IJ SOLN
INTRAMUSCULAR | Status: DC | PRN
  Administered 2013-12-03: 50 ug via INTRAVENOUS
  Administered 2013-12-03 (×2): 25 ug via INTRAVENOUS

## 2013-12-03 SURGICAL SUPPLY — 41 items
ARMBAND PINK RESTRICT EXTREMIT (MISCELLANEOUS) ×2 IMPLANT
BENZOIN TINCTURE PRP APPL 2/3 (GAUZE/BANDAGES/DRESSINGS) ×2 IMPLANT
BLADE 10 SAFETY STRL DISP (BLADE) ×2 IMPLANT
CANISTER SUCTION 2500CC (MISCELLANEOUS) ×2 IMPLANT
CLIP LIGATING EXTRA MED SLVR (CLIP) ×2 IMPLANT
CLIP LIGATING EXTRA SM BLUE (MISCELLANEOUS) ×2 IMPLANT
COVER PROBE W GEL 5X96 (DRAPES) ×2 IMPLANT
COVER SURGICAL LIGHT HANDLE (MISCELLANEOUS) ×2 IMPLANT
DECANTER SPIKE VIAL GLASS SM (MISCELLANEOUS) ×2 IMPLANT
ELECT REM PT RETURN 9FT ADLT (ELECTROSURGICAL) ×2
ELECTRODE REM PT RTRN 9FT ADLT (ELECTROSURGICAL) ×1 IMPLANT
GAUZE SPONGE 2X2 8PLY STRL LF (GAUZE/BANDAGES/DRESSINGS) ×1 IMPLANT
GEL ULTRASOUND 20GR AQUASONIC (MISCELLANEOUS) IMPLANT
GLOVE BIOGEL PI IND STRL 7.0 (GLOVE) ×1 IMPLANT
GLOVE BIOGEL PI IND STRL 7.5 (GLOVE) ×1 IMPLANT
GLOVE BIOGEL PI INDICATOR 7.0 (GLOVE) ×1
GLOVE BIOGEL PI INDICATOR 7.5 (GLOVE) ×1
GLOVE SS BIOGEL STRL SZ 7 (GLOVE) ×1 IMPLANT
GLOVE SS BIOGEL STRL SZ 7.5 (GLOVE) ×1 IMPLANT
GLOVE SUPERSENSE BIOGEL SZ 7 (GLOVE) ×1
GLOVE SUPERSENSE BIOGEL SZ 7.5 (GLOVE) ×1
GLOVE SURG SS PI 7.0 STRL IVOR (GLOVE) ×2 IMPLANT
GOWN STRL REUS W/ TWL LRG LVL3 (GOWN DISPOSABLE) ×3 IMPLANT
GOWN STRL REUS W/TWL LRG LVL3 (GOWN DISPOSABLE) ×3
KIT BASIN OR (CUSTOM PROCEDURE TRAY) ×2 IMPLANT
KIT ROOM TURNOVER OR (KITS) ×2 IMPLANT
NS IRRIG 1000ML POUR BTL (IV SOLUTION) ×2 IMPLANT
PACK CV ACCESS (CUSTOM PROCEDURE TRAY) ×2 IMPLANT
PAD ARMBOARD 7.5X6 YLW CONV (MISCELLANEOUS) ×4 IMPLANT
SLEEVE SURGEON STRL (DRAPES) ×2 IMPLANT
SPONGE GAUZE 2X2 STER 10/PKG (GAUZE/BANDAGES/DRESSINGS) ×1
SPONGE GAUZE 4X4 12PLY (GAUZE/BANDAGES/DRESSINGS) ×2 IMPLANT
STRIP CLOSURE SKIN 1/2X4 (GAUZE/BANDAGES/DRESSINGS) ×2 IMPLANT
SUT PROLENE 6 0 CC (SUTURE) ×2 IMPLANT
SUT VIC AB 3-0 SH 27 (SUTURE) ×2
SUT VIC AB 3-0 SH 27X BRD (SUTURE) ×2 IMPLANT
TAPE CLOTH SURG 4X10 WHT LF (GAUZE/BANDAGES/DRESSINGS) ×2 IMPLANT
TOWEL OR 17X24 6PK STRL BLUE (TOWEL DISPOSABLE) ×2 IMPLANT
TOWEL OR 17X26 10 PK STRL BLUE (TOWEL DISPOSABLE) ×2 IMPLANT
UNDERPAD 30X30 INCONTINENT (UNDERPADS AND DIAPERS) ×2 IMPLANT
WATER STERILE IRR 1000ML POUR (IV SOLUTION) ×2 IMPLANT

## 2013-12-03 NOTE — H&P (View-Only) (Signed)
   Case d/w Dr. Jonnie Finner.  Pt's thrombocytopenia of unknown etiology.  In Dr. Barkley Bruns opinion, he feels she need access placement this admission as she is now ESRD.  As her thrombocytopenia has been stable since her transfusion, he feels the active process has ceased.      CBC    Component Value Date/Time   WBC 10.6* 12/02/2013 0432   RBC 2.53* 12/02/2013 0432   RBC 2.90* 11/28/2013 2200   HGB 7.8* 12/02/2013 0432   HCT 22.9* 12/02/2013 0432   PLT 47* 12/02/2013 0432   MCV 90.5 12/02/2013 0432   MCH 30.8 12/02/2013 0432   MCHC 34.1 12/02/2013 0432   RDW 14.2 12/02/2013 0432   LYMPHSABS 2.4 12/02/2013 0432   MONOABS 0.6 12/02/2013 0432   EOSABS 0.1 12/02/2013 0432   BASOSABS 0.0 12/02/2013 0432   - to facilitate procedure tomorrow, I am going to transfused 2 u Plt (2 6-pk) tonight - as long as plt count remains >60K, will proceed with AVF/AVG placement tomorrow  Adele Barthel, MD [facility] Office: [phone] Pager: [phone]  12/02/2013, 5:48 PM

## 2013-12-03 NOTE — Telephone Encounter (Addendum)
Message copied by Gena Fray on Mon Dec 03, 2013  2:51 PM ------      Message from: Denman George      Created: Mon Dec 03, 2013 11:56 AM      Regarding: 4 wk vasc appt and f/u with TFE                    ----- Message -----         From: Ulyses Amor, PA-C         Sent: 12/03/2013  11:03 AM           To: Vvs Charge Pool            F/u for fistula duplex in 4 weeks Dr. Donnetta Hutching thx mc ------  12/03/13: left detailed msg for pt on home #, dm

## 2013-12-03 NOTE — Interval H&P Note (Signed)
History and Physical Interval Note:  12/03/2013 8:58 AM  Karen Morrow  has presented today for surgery, with the diagnosis of End Stage Renal Disease  The various methods of treatment have been discussed with the patient and family. After consideration of risks, benefits and other options for treatment, the patient has consented to  Procedure(s): ARTERIOVENOUS (AV) FISTULA CREATION OF RIGHT ARM VS INSERTION OF RIGHT ARM ARTERIOVENOUS GORTEX GRAFT (Right) as a surgical intervention .  The patient's history has been reviewed, patient examined, no change in status, stable for surgery.  I have reviewed the patient's chart and labs.  Questions were answered to the patient's satisfaction.     Asucena Galer

## 2013-12-03 NOTE — Op Note (Signed)
    OPERATIVE REPORT  DATE OF SURGERY: 12/03/2013  PATIENT: Karen Morrow, 53 y.o. female MRN: [id number]  DOB: [birth year]  PRE-OPERATIVE DIAGNOSIS: End-stage renal disease  POST-OPERATIVE DIAGNOSIS:  Same  PROCEDURE: Right basilic vein transposition, first stage  SURGEON:  Curt Jews, M.D.  PHYSICIAN ASSISTANT: Collin  ANESTHESIA:  Local with sedation  EBL: Minimal ml  Total I/O In: 250 [I.V.:250] Out: -   BLOOD ADMINISTERED: None  DRAINS: None  SPECIMEN: None  COUNTS CORRECT:  YES  PLAN OF CARE: PACU   PATIENT DISPOSITION:  PACU - hemodynamically stable  PROCEDURE DETAILS: The patient was taken up replacing with air the right arm for a sterile fashion. Preoperative vein map showed good size cephalic vein throughout its course. SonoSite visualization with a small cephalic and forearm and thickening of the cephalic vein at the antecubital space but good caliber. Using local anesthesia incision was made at the antecubital space and carried down to isolate the brachial artery which was of excellent size and caliber. It cephalic vein at the antecubital space was also of good size of was thickened. The cephalic vein was mobilized and tributary branches were ligated with 304 surpassed divided. The vein was ligated distally. The vein was dilated with heparinized saline and felt occlusive in the upper arm. A 3 dilator passed with resistance in the vein an above elbow position. This was then heparinized flush the prostate and obviously had been disrupted with subtotal occlusion of the cephalic vein at this area. For this reason further attempts at cephalic vein fistula were abandoned. The basilic vein was imaged with the SonoSite ultrasound was found to be of adequate caliber for attempted first stage of basilic vein fistula. The vein incision was made over the vein using local anesthesia and the basilic vein branches were ligated with 30 and 4 surpassed divided. The vein was dilated  with her concerning after ligation and division distally. Some of adequate size. The vein was brought through a subcutaneous tunnel to the level of the brachial artery. The brachial artery was occluded proximal and distally was opened with an 11 blade and symmetry Potts scissors. The vein was cut to appropriate length and was spatulated and sewn end to side to the brachial vein with a running 6-0 Prolene suture. Clamps removed and the dura was noted. The wound irrigated with saline. Hemostasis tablet cautery. Wounds were closed with 3-0 Vicryl in the subcutaneous and subcuticular tissue. Benzoin Steri-Strips were applied   Curt Jews, M.D. 12/03/2013 11:07 AM

## 2013-12-03 NOTE — Anesthesia Preprocedure Evaluation (Signed)
Anesthesia Evaluation  Patient identified by MRN, date of birth, ID band Patient awake    Reviewed: Allergy & Precautions, H&P , NPO status , Patient's Chart, lab work & pertinent test results, reviewed documented beta blocker date and time   Airway Mallampati: II TM Distance: >3 FB Neck ROM: full    Dental   Pulmonary asthma , Current Smoker,  breath sounds clear to auscultation        Cardiovascular hypertension, On Medications + angina Rhythm:regular     Neuro/Psych negative neurological ROS  negative psych ROS   GI/Hepatic PUD, (+) Cirrhosis -      , C  Endo/Other  negative endocrine ROS  Renal/GU Dialysis and ESRFRenal disease  negative genitourinary   Musculoskeletal   Abdominal   Peds  Hematology  (+) anemia ,   Anesthesia Other Findings See surgeon's H&P   Reproductive/Obstetrics negative OB ROS                           Anesthesia Physical Anesthesia Plan  ASA: III  Anesthesia Plan: MAC   Post-op Pain Management:    Induction: Intravenous  Airway Management Planned: Simple Face Mask  Additional Equipment:   Intra-op Plan:   Post-operative Plan:   Informed Consent: I have reviewed the patients History and Physical, chart, labs and discussed the procedure including the risks, benefits and alternatives for the proposed anesthesia with the patient or authorized representative who has indicated his/her understanding and acceptance.   Dental Advisory Given  Plan Discussed with: CRNA and Surgeon  Anesthesia Plan Comments:         Anesthesia Quick Evaluation

## 2013-12-03 NOTE — Progress Notes (Signed)
[facility]  PROGRESS NOTE     Interim History:   53 y.o. year-old patient with hx of CKD, hep C and HTN. She presents with progressive anorexia, loss of taste, leg and facial swelling, loss of energy,ED today creat is 14. In 2011 it was 0.72. In June last year is was 2.5, in Jan this year creat was 2.96 and in March creat was up to 3.6, no NSAID's due to PUD. Renal US showed Chronic medical renal disease. no neprhrotoxins , ACEi or contrast.  During her hospital stay patient was initiated on dialysis, she is close to being ESRD, she required packed RBC transfusions for her anemia which appeared to be anemia of chronic disease, she also developed severe thrombocytopenia requiring platelet transfusion x1, etiology of thrombocytopenia is unclear, hematology following the patient she seems to be responding to IV steroids. Clinically TTP, ITP, HUS have been ruled out.    HPI/Subjective:   In bed, no headache, no chest-Abd pain, no SOB, no focal weakness,  itching resolved, no bleeding or bruising. Overall feels much better.     Assessment/Plan:    AKI (acute kidney injury) - renal on board, has right subclavian dialysis catheter placed by IR on 11/27/2013, started on dialysis by renal. - Unclear etiology of AKI there are no nephrotoxin drugs, ? Due Hep C. renal following. -Due for right arm fistula placement by vascular surgery on 12/03/2013     Severe thrombocytopenia happened early morning 11/29/2013 baseline platelet level was above 182 two days ago.   Etiology unclear, not on any heparin/Lovenox for prophylaxis, clinical picture does not look consistent with TTP or HUS, however cannot completely rule out, has some heparin exposure possibly during dialysis during her first 2 days hence have ordered HIT panel , noted LDH-heptoglobin and peripheral smear. No signs of bleeding.  Discussed her case with Dr. Beryle Beams who has provided hematology consultation, continue  supportive care with IV Solu-Medrol, she status post one pack platelet transfusion on 11-29-2013 goal to keep her platelets above 20,000.  She received another 2 units of platelets on 12/02/2013 evening per vascular surgery to facilitate right arm fistula placement.   Hematology following her for thrombocytopenia, Per hematology not consistent with ITP, TTP or HUS. Likely combination of acute renal failure, hepatitis C and possibly some viral infection. Minimized Meds. Lately count stable however will need further monitoring especially after she received 2 units of platelets on 12/02/2013 to document continued stability.      Anemia of chronic disease:  No signs of overt bleeding, stool occult blood pending as patient is constipated without any p.m., anemia panel is inconclusive, she status post 2 units of packed RBC transfusion on 11/28/2013 with stable H&H post transfusion. Have also ordered LDH, haptoglobin, peripheral smear. We'll continue to monitor H&H. On PPI.   Occult Blood -ve, DC'd PPI.     Cirrhosis of liver without mention of alcohol, suggested by abdominal US/ Hepatitis C antibody test positive - not on treatment. We'll have her follow with GI and ID post discharge. Discussed her case with ID physician Dr. Linus Salmons no indication for acute hep C treatment      Rash/itching: - Likely due to uremia now resolved.     HTN (hypertension): - Stable , placed on low-dose beta blocker.    Intermittent tachycardia.  Initially was persistent due to severe anemia, now resting tachycardia has resolved, TSH stable, echogram unremarkable, no shortness of breath.  Still gets episodes of tachycardia upon exertion, likely  due to deconditioning, will have her sit in the chair and daytime and increase her ambulation with assistance. Monitor on telemetry.     Constipation (had diarrhea upon admission which is completely resolved)  Resolved after bowel regimen.       Code  Status: full Family Communication: none  Disposition Plan: inpatient     Consultants:  Renal, hematology   Procedures:    HD cath 6.30.2015 Rt IJ. TDC, Echogram with EF 60% and LVH.   Echo  -  Mild LVH with LVEF [id number], grade 1 diastolic dysfunction. Trivial mitral and aortic regurgitation. Mildly dilated left atrium. Unable to assess PASP.    Antibiotics:  None     Objective: Filed Vitals:   12/03/13 0040 12/03/13 0119 12/03/13 0219 12/03/13 0535  BP: 144/86 126/77 145/88 141/91  Pulse: 74 75 89 85  Temp: 98.8 F (37.1 C) 98.7 F (37.1 C) 98.5 F (36.9 C) 97.5 F (36.4 C)  TempSrc: Oral Oral Oral Oral  Resp: 16 16 16 16   Height:      Weight:      SpO2: 97% 95% 98% 98%    Intake/Output Summary (Last 24 hours) at 12/03/13 0803 Last data filed at 12/03/13 0104  Gross per 24 hour  Intake    705 ml  Output      1 ml  Net    704 ml   Filed Weights   12/01/13 1530 12/01/13 2100 12/02/13 2125  Weight: 59.4 kg (130 lb 15.3 oz) 59 kg (130 lb 1.1 oz) 60.2 kg (132 lb 11.5 oz)    Exam:  General: Alert, awake, oriented x3, in no acute distress.  HEENT: No bruits, no goiter. +JVD Heart: Regular rate and rhythm,  Lungs: Good air movement,clear.  Abdomen: Soft, nontender, nondistended, positive bowel sounds.  Neuro: Grossly intact, nonfocal.   Data Reviewed: Basic Metabolic Panel:  Recent Labs Lab 11/27/13 0300 11/28/13 0135 11/29/13 0300 12/01/13 0455 12/02/13 0432 12/03/13 0402  NA 141 138 139 136* 139 139  K 4.5 3.2* 4.2 4.1 3.9 3.5*  CL 109 101 101 99 99 98  CO2 9* 17* 21 25 28 26   GLUCOSE 122* 101* 91 93 73 80  BUN 82* 56* 35* 38* 25* 35*  CREATININE 14.16* 9.80* 6.62* 6.00* 3.76* 5.27*  CALCIUM 6.7* 6.8* 7.1* 7.0* 7.0* 7.0*  PHOS 9.9*  --   --   --   --   --    Liver Function Tests:  Recent Labs Lab 11/28/13 0135 11/29/13 0300 12/01/13 0455 12/02/13 0432 12/03/13 0402  AST 12 19 16 26 21   ALT 10 10 9 13 15   ALKPHOS 64 66 57 64  61  BILITOT 0.2* 0.4 <0.2* <0.2* 0.3  PROT 5.5* 6.1 5.5* 5.3* 5.6*  ALBUMIN 2.0* 2.2* 2.2* 2.0* 2.3*   No results found for this basename: LIPASE, AMYLASE,  in the last 168 hours No results found for this basename: AMMONIA,  in the last 168 hours CBC:  Recent Labs Lab 11/30/13 0200 11/30/13 0830 12/01/13 0455 12/02/13 0432 12/03/13 0402  WBC 9.6 9.9 11.9* 10.6* 10.1  NEUTROABS  --  8.3* 9.5* 7.5 7.4  HGB 7.8* 8.7* 7.6* 7.8* 7.7*  HCT 22.4* 25.2* 22.1* 22.9* 22.9*  MCV 87.2 90.0 89.1 90.5 91.2  PLT 25* 26* 48* 47* 87*   Cardiac Enzymes:  Recent Labs Lab 11/26/13 2005 11/27/13 0654  TROPONINI <0.30 <0.30   BNP (last 3 results)  Recent Labs  11/26/13 1732  PROBNP 11948.0*  CBG:  Recent Labs Lab 11/26/13 2056 12/01/13 1620 12/02/13 0844  GLUCAP 93 88 82    Recent Results (from the past 240 hour(s))  MRSA PCR SCREENING     Status: None   Collection Time    11/26/13  9:20 PM      Result Value Ref Range Status   MRSA by PCR NEGATIVE  NEGATIVE Final   Comment:            The GeneXpert MRSA Assay (FDA     approved for NASAL specimens     only), is one component of a     comprehensive MRSA colonization     surveillance program. It is not     intended to diagnose MRSA     infection nor to guide or     monitor treatment for     MRSA infections.  CLOSTRIDIUM DIFFICILE BY PCR     Status: None   Collection Time    11/29/13 10:40 PM      Result Value Ref Range Status   C difficile by pcr NEGATIVE  NEGATIVE Final  STOOL CULTURE     Status: None   Collection Time    11/29/13 10:40 PM      Result Value Ref Range Status   Specimen Description STOOL   Final   Special Requests NONE   Final   Culture     Final   Value: NO SUSPICIOUS COLONIES, CONTINUING TO HOLD     Performed at [facility]   Report Status PENDING   Incomplete  SURGICAL PCR SCREEN     Status: Abnormal   Collection Time    12/03/13  2:27 AM      Result Value Ref Range Status   MRSA,  PCR NEGATIVE  NEGATIVE Final   Staphylococcus aureus POSITIVE (*) NEGATIVE Final   Comment:            The Xpert SA Assay (FDA     approved for NASAL specimens     in patients over 25 years of age),     is one component of     a comprehensive surveillance     program.  Test performance has     been validated by [employer] for patients greater     than or equal to 4 year old.     It is not intended     to diagnose infection nor to     guide or monitor treatment.     Studies: No results found.  Scheduled Meds: . amLODipine  10 mg Oral Daily  . anticoagulant sodium citrate  5 mL Intravenous Once  . cefUROXime (ZINACEF)  IV  1.5 g Intravenous On Call to OR  . darbepoetin (ARANESP) injection - DIALYSIS  40 mcg Intravenous Q Sat-HD  . feeding supplement (NEPRO CARB STEADY)  237 mL Oral BID BM  . methylPREDNISolone (SOLU-MEDROL) injection  60 mg Intravenous Daily  . metoprolol tartrate  50 mg Oral BID  . sodium chloride  3 mL Intravenous Q12H   Continuous Infusions:    Thurnell Lose  [facility] Pager [phone]. If 8PM-8AM, please contact night-coverage at [email or web address], password [facility] 12/03/2013, 8:03 AM  LOS: 7 days      **Disclaimer: This note may have been dictated with voice recognition software. Similar sounding words can inadvertently be transcribed and this note may contain transcription errors which may not have been corrected upon publication of note.**

## 2013-12-03 NOTE — Progress Notes (Signed)
Patient ID: Karen Morrow, female   DOB: 1961/04/26, 53 y.o.   MRN: [id number] Partial recovery of platelet count to 48,000 stable x 24 hours. She was given a platelet transfusion this AM in anticipation of a vascular access procedure; post transfusion count 87,000. No new data of interest. Stool cultures and C difficile negative  Heparin antibody test still not back Bilirubin remains low normal Still getting solumedrol 60 mg IV daily If platelet count stable tomorrow - I would stop all steroids Etiology of abrupt fall in platelet count remains unclear. Discussed with Dr Candiss Norse

## 2013-12-03 NOTE — Progress Notes (Signed)
12/03/2013 4:16 PM Hemodialysis update, Financial counselor [name], informed me that the patient will have an application for Medicaid taken probably Wednesday. Karen Morrow

## 2013-12-03 NOTE — Transfer of Care (Signed)
Immediate Anesthesia Transfer of Care Note  Patient: Karen Morrow  Procedure(s) Performed: Procedure(s): CREATION OF ARTERIOVENOUS (AV) FISTULA RIGHT ARM  (Right)  Patient Location: PACU  Anesthesia Type:MAC  Level of Consciousness: awake, alert  and oriented  Airway & Oxygen Therapy: Patient Spontanous Breathing  Post-op Assessment: Report given to PACU RN and Post -op Vital signs reviewed and stable  Post vital signs: Reviewed and stable  Complications: No apparent anesthesia complications

## 2013-12-03 NOTE — Anesthesia Procedure Notes (Signed)
Procedure Name: MAC Date/Time: 12/03/2013 9:35 AM Performed by: Susa Loffler Pre-anesthesia Checklist: Patient identified, Timeout performed, Emergency Drugs available, Suction available and Patient being monitored Patient Re-evaluated:Patient Re-evaluated prior to inductionOxygen Delivery Method: Simple face mask Preoxygenation: Pre-oxygenation with 100% oxygen Dental Injury: Teeth and Oropharynx as per pre-operative assessment

## 2013-12-03 NOTE — Anesthesia Postprocedure Evaluation (Signed)
Anesthesia Post Note  Patient: Karen Morrow  Procedure(s) Performed: Procedure(s) (LRB): CREATION OF ARTERIOVENOUS (AV) FISTULA RIGHT ARM  (Right)  Anesthesia type: MAC  Patient location: PACU  Post pain: Pain level controlled  Post assessment: Patient's Cardiovascular Status Stable  Last Vitals:  Filed Vitals:   12/03/13 1129  BP: 120/74  Pulse: 76  Temp: 36.3 C  Resp: 19    Post vital signs: Reviewed and stable  Level of consciousness: alert  Complications: No apparent anesthesia complications

## 2013-12-03 NOTE — Progress Notes (Signed)
Patient complaining of drainage at incision site from right [facility] fistula placement this morning.  RN noticed that several steri-strips were missing.   Serous drainage from incision site.  Applied pressure and site redressed.  No complaints of pain.  Mild swelling noted with palpable radial pulse.  Dr. Donnetta Hutching notified.  Will continue to monitor.

## 2013-12-03 NOTE — Progress Notes (Signed)
Assessment:  1. CKD stage V- new start to dialysis, s/p HD x 4. CLIP process started, S/P BVT RUE today 2. Thrombocytopenia - acute process, unclear cause, hx cirrhosis but plts were normal on admission. HIT panel pending. Transfused plts on 7/2. Hematology following. 3. HTN/Volume - BP high normal. Did not tolerate attempts at fluid removal initially with tachycardia and hypotension so her HTN is being treated w medication. Wt / volume are stable. 4. Anemia - Hgb 9.2. Aranesp 40 ordered q Sat. Tsat 29%. Ferritin 357. Transfused 2u prbc's.  5. Sec HPT - Ca 7.1 (8.5 corrected). Phos 9.9. Check iPTH next HD. 6. Hep C diagnosed in 2011 7. Tobacco / COPD by cxr 8. Hx GIB x 2 9. Secondary hyperparathyroism and hyperphos Plan Begin Renvela; HD in AM, CLIP  Subjective: Interval History: S/P BVT  Objective: Vital signs in last 24 hours: Temp:  [97.4 F (36.3 C)-98.8 F (37.1 C)] 98.2 F (36.8 C) (07/06 1228) Pulse Rate:  [73-89] 83 (07/06 1228) Resp:  [12-19] 17 (07/06 1228) BP: (113-145)/(74-98) 144/98 mmHg (07/06 1228) SpO2:  [94 %-100 %] 96 % (07/06 1228) Weight:  [60.2 kg (132 lb 11.5 oz)] 60.2 kg (132 lb 11.5 oz) (07/05 2125) Weight change: 1.6 kg (3 lb 8.4 oz)  Intake/Output from previous day: 07/05 0701 - 07/06 0700 In: 705 [P.O.:680; Blood:25] Out: 1 [Stool:1] Intake/Output this shift: Total I/O In: 250 [I.V.:250] Out: -   General appearance: alert and cooperative Head: Normocephalic, without obvious abnormality, atraumatic Resp: clear to auscultation bilaterally Chest wall: no tenderness Cardio: regular rate and rhythm, S1, S2 normal, no murmur, click, rub or gallop Extremities: edema 1-2+ bilat  Lab Results:  Recent Labs  12/02/13 0432 12/03/13 0402  WBC 10.6* 10.1  HGB 7.8* 7.7*  HCT 22.9* 22.9*  PLT 47* 87*   BMET:  Recent Labs  12/02/13 0432 12/03/13 0402  NA 139 139  K 3.9 3.5*  CL 99 98  CO2 28 26  GLUCOSE 73 80  BUN 25* 35*  CREATININE 3.76*  5.27*  CALCIUM 7.0* 7.0*    Recent Labs  12/01/13 1220  PTH 691.8*   Iron Studies: No results found for this basename: IRON, TIBC, TRANSFERRIN, FERRITIN,  in the last 72 hours Studies/Results: No results found.  Scheduled: . amLODipine  10 mg Oral Daily  . anticoagulant sodium citrate  5 mL Intravenous Once  . darbepoetin (ARANESP) injection - DIALYSIS  40 mcg Intravenous Q Sat-HD  . feeding supplement (NEPRO CARB STEADY)  237 mL Oral BID BM  . methylPREDNISolone (SOLU-MEDROL) injection  60 mg Intravenous Daily  . metoprolol tartrate  50 mg Oral BID  . sodium chloride  3 mL Intravenous Q12H     LOS: 7 days   Sedra Morfin C 12/03/2013,1:46 PM

## 2013-12-03 NOTE — Progress Notes (Signed)
BP elevated this AM at 147/91. Pt is asymptomatic. Per Ernestina Patches MD, monitor the blood pressure. No new orders at this time. Velora Mediate

## 2013-12-04 LAB — CBC WITH DIFFERENTIAL/PLATELET
Basophils Absolute: 0 10*3/uL (ref 0.0–0.1)
Basophils Relative: 0 % (ref 0–1)
Eosinophils Absolute: 0.3 10*3/uL (ref 0.0–0.7)
Eosinophils Relative: 3 % (ref 0–5)
HCT: 23 % — ABNORMAL LOW (ref 36.0–46.0)
Hemoglobin: 7.7 g/dL — ABNORMAL LOW (ref 12.0–15.0)
Lymphocytes Relative: 21 % (ref 12–46)
Lymphs Abs: 2 10*3/uL (ref 0.7–4.0)
MCH: 30.3 pg (ref 26.0–34.0)
MCHC: 33.5 g/dL (ref 30.0–36.0)
MCV: 90.6 fL (ref 78.0–100.0)
Monocytes Absolute: 0.4 10*3/uL (ref 0.1–1.0)
Monocytes Relative: 4 % (ref 3–12)
Neutro Abs: 6.7 10*3/uL (ref 1.7–7.7)
Neutrophils Relative %: 71 % (ref 43–77)
Platelets: 88 10*3/uL — ABNORMAL LOW (ref 150–400)
RBC: 2.54 MIL/uL — ABNORMAL LOW (ref 3.87–5.11)
RDW: 13.9 % (ref 11.5–15.5)
WBC: 9.4 10*3/uL (ref 4.0–10.5)

## 2013-12-04 LAB — COMPREHENSIVE METABOLIC PANEL
ALT: 14 U/L (ref 0–35)
AST: 22 U/L (ref 0–37)
Albumin: 2.1 g/dL — ABNORMAL LOW (ref 3.5–5.2)
Alkaline Phosphatase: 73 U/L (ref 39–117)
Anion gap: 16 — ABNORMAL HIGH (ref 5–15)
BUN: 48 mg/dL — ABNORMAL HIGH (ref 6–23)
CO2: 24 mEq/L (ref 19–32)
Calcium: 6.9 mg/dL — ABNORMAL LOW (ref 8.4–10.5)
Chloride: 95 mEq/L — ABNORMAL LOW (ref 96–112)
Creatinine, Ser: 6.18 mg/dL — ABNORMAL HIGH (ref 0.50–1.10)
GFR calc Af Amer: 8 mL/min — ABNORMAL LOW (ref >90)
GFR calc non Af Amer: 7 mL/min — ABNORMAL LOW (ref >90)
Glucose, Bld: 94 mg/dL (ref 70–99)
Potassium: 3.9 mEq/L (ref 3.7–5.3)
Sodium: 135 mEq/L — ABNORMAL LOW (ref 137–147)
Total Bilirubin: <0.2 mg/dL — ABNORMAL LOW (ref 0.3–1.2)
Total Protein: 5.5 g/dL — ABNORMAL LOW (ref 6.0–8.3)

## 2013-12-04 LAB — OVA AND PARASITE EXAMINATION: Ova and parasites: NONE SEEN

## 2013-12-04 LAB — PREPARE PLATELET PHERESIS
Unit division: 0
Unit division: 0
Unit division: 0

## 2013-12-04 LAB — GLUCOSE, CAPILLARY
Glucose-Capillary: 71 mg/dL (ref 70–99)
Glucose-Capillary: 114 mg/dL — ABNORMAL HIGH (ref 70–99)
Glucose-Capillary: 109 mg/dL — ABNORMAL HIGH (ref 70–99)

## 2013-12-04 MED ORDER — ALTEPLASE 2 MG IJ SOLR
2.0000 mg | Freq: Once | INTRAMUSCULAR | Status: DC | PRN
  Filled 2013-12-04: qty 2

## 2013-12-04 MED ORDER — LIDOCAINE HCL (PF) 1 % IJ SOLN: 5.0000 mL | INTRAMUSCULAR | Status: DC | PRN

## 2013-12-04 MED ORDER — PENTAFLUOROPROP-TETRAFLUOROETH EX AERO: 1.0000 "application " | INHALATION_SPRAY | CUTANEOUS | Status: DC | PRN

## 2013-12-04 MED ORDER — NEPRO/CARBSTEADY PO LIQD: 237.0000 mL | ORAL | Status: DC | PRN

## 2013-12-04 MED ORDER — LIDOCAINE-PRILOCAINE 2.5-2.5 % EX CREA: 1.0000 "application " | TOPICAL_CREAM | CUTANEOUS | Status: DC | PRN

## 2013-12-04 MED ORDER — SODIUM CHLORIDE 0.9 % IV SOLN: 100.0000 mL | INTRAVENOUS | Status: DC | PRN

## 2013-12-04 NOTE — Progress Notes (Signed)
Assessment:  1. CKD stage V- new start to dialysis, s/p HD x 4, S/P BVT RUE, await CLIP 2. Thrombocytopenia - acute process, hx cirrhosis but plts were normal on admission. Hematology following. HIT negative 3. HTN/Volume -improved  4. Anemia - s/p Transfused 2u prbc's, on ESA rx.  5. Sec HPT - and hyper phos.  Add vita D after phos better 6. Hep C diagnosed in 2011 7. Tobacco / COPD by cxr 8. Hx GIB x 2  Plan Begin Renvela; HD in AM, CLIP  Subjective: Interval History: Feels better  Objective: Vital signs in last 24 hours: Temp:  [97.8 F (36.6 C)-98.7 F (37.1 C)] 98 F (36.7 C) (07/07 0900) Pulse Rate:  [83-95] 95 (07/07 0900) Resp:  [17-21] 17 (07/07 0900) BP: (115-144)/(79-98) 142/92 mmHg (07/07 0900) SpO2:  [94 %-100 %] 100 % (07/07 0900) Weight:  [61.4 kg (135 lb 5.8 oz)] 61.4 kg (135 lb 5.8 oz) (07/06 2123) Weight change: 1.2 kg (2 lb 10.3 oz)  Intake/Output from previous day: 07/06 0701 - 07/07 0700 In: 970 [P.O.:720; I.V.:250] Out: -  Intake/Output this shift: Total I/O In: 360 [P.O.:360] Out: -   General appearance: alert and cooperative Resp: clear to auscultation bilaterally Chest wall: no tenderness Cardio: regular rate and rhythm, S1, S2 normal, no murmur, click, rub or gallop Extremities: edema 1-2+ bilat, RUE AVF with thrill  Lab Results:  Recent Labs  12/03/13 0402 12/04/13 0500  WBC 10.1 9.4  HGB 7.7* 7.7*  HCT 22.9* 23.0*  PLT 87* 88*   BMET:  Recent Labs  12/03/13 0402 12/04/13 0500  NA 139 135*  K 3.5* 3.9  CL 98 95*  CO2 26 24  GLUCOSE 80 94  BUN 35* 48*  CREATININE 5.27* 6.18*  CALCIUM 7.0* 6.9*    Recent Labs  12/01/13 1220  PTH 691.8*   Iron Studies: No results found for this basename: IRON, TIBC, TRANSFERRIN, FERRITIN,  in the last 72 hours Studies/Results: No results found.  Scheduled: . amLODipine  10 mg Oral Daily  . anticoagulant sodium citrate  5 mL Intravenous Once  . darbepoetin (ARANESP) injection -  DIALYSIS  40 mcg Intravenous Q Sat-HD  . feeding supplement (NEPRO CARB STEADY)  237 mL Oral BID BM  . metoprolol tartrate  50 mg Oral BID  . sevelamer carbonate  2,400 mg Oral TID WC  . sodium chloride  3 mL Intravenous Q12H     LOS: 8 days   Karen Morrow C 12/04/2013,12:01 PM

## 2013-12-04 NOTE — Progress Notes (Signed)
Patient ID: Karen Morrow, female   DOB: 03-21-1961, 53 y.o.   MRN: [id number] Comfortable. Up walking around the room. Asking when she can be discharged. Right antecubital incisions look good. No hematoma. Slight wound separation that the lower pole of the brachial incision. We'll replace dressing. Excellent thrill and basilic vein. Discussed plan with the patient. We'll see her in one month in the office and as long as she is having dilatation of the basilic vein will have an outpatient second stage of basilic vein transposition. Will not follow actively while in hospital. Please call us for vascular concerns and prior to discharge

## 2013-12-04 NOTE — Progress Notes (Signed)
[facility]  PROGRESS NOTE     Interim History:   53 y.o. year-old patient with hx of CKD, hep C and HTN. She presents with progressive anorexia, loss of taste, leg and facial swelling, loss of energy,ED today creat is 14. In 2011 it was 0.72. In June last year is was 2.5, in Jan this year creat was 2.96 and in March creat was up to 3.6, no NSAID's due to PUD. Renal US showed Chronic medical renal disease. no neprhrotoxins , ACEi or contrast.  During her hospital stay patient was initiated on dialysis, she is close to being ESRD, she required packed RBC transfusions for her anemia which appeared to be anemia of chronic disease, she also developed severe thrombocytopenia requiring platelet transfusion x1, etiology of thrombocytopenia is unclear, hematology following the patient she seems to be responding to IV steroids. Clinically TTP, ITP, HUS have been ruled out.    HPI/Subjective:   In bed, no headache, no chest-Abd pain, no SOB, no focal weakness,  itching resolved, no bleeding or bruising. Overall feels much better.      Assessment/Plan:    AKI (acute kidney injury) - renal on board, has right subclavian dialysis catheter placed by IR on 11/27/2013, started on dialysis by renal. - Unclear etiology of AKI there are no nephrotoxin drugs, ? Due Hep C. renal following. -Due for right arm fistula placement by vascular surgery on 12/03/2013     Severe thrombocytopenia happened early morning 11/29/2013 baseline platelet level was above 182 two days ago.   Etiology unclear, not on any heparin/Lovenox for prophylaxis, clinical picture does not look consistent with TTP or HUS, however cannot completely rule out, has some heparin exposure possibly during dialysis during her first 2 days hence have ordered HIT panel , noted LDH-heptoglobin and peripheral smear. No signs of bleeding.  Discussed her case with Dr. Beryle Beams who has provided hematology consultation, continue  supportive care with IV Solu-Medrol, she status post one pack platelet transfusion on 11-29-2013 and another 2 units of platelets on 12/02/2013 evening per vascular surgery to facilitate right arm fistula placement.   Hematology following her for thrombocytopenia, Per hematology not consistent with ITP, TTP or HUS. Likely combination of acute renal failure, hepatitis C and possibly some viral infection. Minimized Meds. Ounce are now stable discussed the case with Dr. Beryle Beams on 12/04/2013. Stop steroids and monitor.      Anemia of chronic disease:  No signs of overt bleeding, stool occult blood pending as patient is constipated without any p.m., anemia panel is inconclusive, she status post 2 units of packed RBC transfusion on 11/28/2013 with stable H&H post transfusion. Have also ordered LDH, haptoglobin, peripheral smear. We'll continue to monitor H&H. On PPI.   Occult Blood -ve, DC'd PPI.     Cirrhosis of liver without mention of alcohol, suggested by abdominal US/ Hepatitis C antibody test positive - not on treatment. We'll have her follow with GI and ID post discharge. Discussed her case with ID physician Dr. Linus Salmons no indication for acute hep C treatment      Rash/itching: - Likely due to uremia now resolved.     HTN (hypertension): - Stable , placed on low-dose beta blocker.    Intermittent tachycardia.  Initially was persistent due to severe anemia, now resting tachycardia has resolved, TSH stable, echogram unremarkable, no shortness of breath.  Still gets episodes of tachycardia upon exertion, likely due to deconditioning, will have her sit in the chair and daytime and increase  her ambulation with assistance. Monitor on telemetry.     Constipation (had diarrhea upon admission which is completely resolved)  Resolved after bowel regimen.       Code Status: full Family Communication: none  Disposition Plan: Discharge once social worker has arranged for  outpatient dialysis     Consultants:  Renal, hematology   Procedures:   HD cath 6.30.2015 Rt IJ. TDC, Echogram with EF 60% and LVH.   Echo  -  Mild LVH with LVEF [id number], grade 1 diastolic dysfunction. Trivial mitral and aortic regurgitation. Mildly dilated left atrium. Unable to assess PASP.   Right arm AV fistula placement by Dr.Early. On 12/03/2013     Antibiotics:  None     Objective: Filed Vitals:   12/03/13 1228 12/03/13 1700 12/03/13 2123 12/04/13 0534  BP: 144/98 115/79 127/88 124/85  Pulse: 83 92 91 86  Temp: 98.2 F (36.8 C) 98.4 F (36.9 C) 98.7 F (37.1 C) 97.8 F (36.6 C)  TempSrc: Oral Oral Oral Oral  Resp: 17 21 18 18   Height:      Weight:   61.4 kg (135 lb 5.8 oz)   SpO2: 96% 94% 100% 96%    Intake/Output Summary (Last 24 hours) at 12/04/13 1006 Last data filed at 12/03/13 1700  Gross per 24 hour  Intake    970 ml  Output      0 ml  Net    970 ml   Filed Weights   12/01/13 2100 12/02/13 2125 12/03/13 2123  Weight: 59 kg (130 lb 1.1 oz) 60.2 kg (132 lb 11.5 oz) 61.4 kg (135 lb 5.8 oz)    Exam:  General: Alert, awake, oriented x3, in no acute distress.  HEENT: No bruits, no goiter. +JVD Heart: Regular rate and rhythm,  Lungs: Good air movement,clear.  Abdomen: Soft, nontender, nondistended, positive bowel sounds.  Neuro: Grossly intact, nonfocal. Extremity and skin. Right arm fistula site appears stable. Has a right IJ tunneled dialysis catheter in place.   Data Reviewed: Basic Metabolic Panel:  Recent Labs Lab 11/29/13 0300 12/01/13 0455 12/02/13 0432 12/03/13 0402 12/04/13 0500  NA 139 136* 139 139 135*  K 4.2 4.1 3.9 3.5* 3.9  CL 101 99 99 98 95*  CO2 21 25 28 26 24   GLUCOSE 91 93 73 80 94  BUN 35* 38* 25* 35* 48*  CREATININE 6.62* 6.00* 3.76* 5.27* 6.18*  CALCIUM 7.1* 7.0* 7.0* 7.0* 6.9*   Liver Function Tests:  Recent Labs Lab 11/29/13 0300 12/01/13 0455 12/02/13 0432 12/03/13 0402 12/04/13 0500  AST  19 16 26 21 22   ALT 10 9 13 15 14   ALKPHOS 66 57 64 61 73  BILITOT 0.4 <0.2* <0.2* 0.3 <0.2*  PROT 6.1 5.5* 5.3* 5.6* 5.5*  ALBUMIN 2.2* 2.2* 2.0* 2.3* 2.1*   No results found for this basename: LIPASE, AMYLASE,  in the last 168 hours No results found for this basename: AMMONIA,  in the last 168 hours CBC:  Recent Labs Lab 11/30/13 0830 12/01/13 0455 12/02/13 0432 12/03/13 0402 12/04/13 0500  WBC 9.9 11.9* 10.6* 10.1 9.4  NEUTROABS 8.3* 9.5* 7.5 7.4 6.7  HGB 8.7* 7.6* 7.8* 7.7* 7.7*  HCT 25.2* 22.1* 22.9* 22.9* 23.0*  MCV 90.0 89.1 90.5 91.2 90.6  PLT 26* 48* 47* 87* 88*   Cardiac Enzymes: No results found for this basename: CKTOTAL, CKMB, CKMBINDEX, TROPONINI,  in the last 168 hours BNP (last 3 results)  Recent Labs  11/26/13 1732  PROBNP 11948.0*  CBG:  Recent Labs Lab 12/02/13 0844 12/03/13 0744 12/03/13 1211 12/03/13 1712 12/04/13 0800  GLUCAP 82 96 94 102* 71    Recent Results (from the past 240 hour(s))  MRSA PCR SCREENING     Status: None   Collection Time    11/26/13  9:20 PM      Result Value Ref Range Status   MRSA by PCR NEGATIVE  NEGATIVE Final   Comment:            The GeneXpert MRSA Assay (FDA     approved for NASAL specimens     only), is one component of a     comprehensive MRSA colonization     surveillance program. It is not     intended to diagnose MRSA     infection nor to guide or     monitor treatment for     MRSA infections.  CLOSTRIDIUM DIFFICILE BY PCR     Status: None   Collection Time    11/29/13 10:40 PM      Result Value Ref Range Status   C difficile by pcr NEGATIVE  NEGATIVE Final  STOOL CULTURE     Status: None   Collection Time    11/29/13 10:40 PM      Result Value Ref Range Status   Specimen Description STOOL   Final   Special Requests NONE   Final   Culture     Final   Value: NO SALMONELLA, SHIGELLA, CAMPYLOBACTER, YERSINIA, OR E.COLI 0157:H7 ISOLATED     Performed at [facility]   Report Status  12/03/2013 FINAL   Final  SURGICAL PCR SCREEN     Status: Abnormal   Collection Time    12/03/13  2:27 AM      Result Value Ref Range Status   MRSA, PCR NEGATIVE  NEGATIVE Final   Staphylococcus aureus POSITIVE (*) NEGATIVE Final   Comment:            The Xpert SA Assay (FDA     approved for NASAL specimens     in patients over 61 years of age),     is one component of     a comprehensive surveillance     program.  Test performance has     been validated by [employer] for patients greater     than or equal to 102 year old.     It is not intended     to diagnose infection nor to     guide or monitor treatment.     Studies: No results found.  Scheduled Meds: . amLODipine  10 mg Oral Daily  . anticoagulant sodium citrate  5 mL Intravenous Once  . darbepoetin (ARANESP) injection - DIALYSIS  40 mcg Intravenous Q Sat-HD  . feeding supplement (NEPRO CARB STEADY)  237 mL Oral BID BM  . methylPREDNISolone (SOLU-MEDROL) injection  60 mg Intravenous Daily  . metoprolol tartrate  50 mg Oral BID  . sevelamer carbonate  2,400 mg Oral TID WC  . sodium chloride  3 mL Intravenous Q12H   Continuous Infusions:    Thurnell Lose  [facility] Pager [phone]. If 8PM-8AM, please contact night-coverage at [email or web address], password [facility] 12/04/2013, 10:06 AM  LOS: 8 days      **Disclaimer: This note may have been dictated with voice recognition software. Similar sounding words can inadvertently be transcribed and this note may contain transcription errors which may not have been corrected upon  publication of note.**

## 2013-12-04 NOTE — Progress Notes (Signed)
Had hemodialysis Tx. Had some drops in BP accompanied by chest tightness that would resolve when BP came up. Dr. Florene Glen notified, no further intervention

## 2013-12-04 NOTE — Procedures (Signed)
On dialysis. Stable. Has OP spot at [facility]. TTS 2nd shift Not evidence of HIT, possible drug induced thrombocytopenia from cefazolin or protonix per hematology.  Also Hep C pos with high viral load and will need to go to ID clinic for eval & tx. Karen Morrow C

## 2013-12-04 NOTE — Progress Notes (Signed)
NUTRITION FOLLOW-UP  DOCUMENTATION CODES Per approved criteria  -Non-severe (moderate) malnutrition in the context of chronic illness   Patient meets criteria for non-severe (moderate) malnutrition in the context of chronic illness as evidenced by < 75% intake of estimated energy requirement for > 1 month and 7% weight loss x 3 months.  INTERVENTION: Nepro Shake po BID, each supplement provides 425 kcal and 19 grams protein RD provided diet education materials during this visit, however patient opted to review on own RD to follow for nutrition care plan  NUTRITION DIAGNOSIS: Increased nutrient needs related to CKD, need for HD as evidenced by estimated nutrition needs. Ongoing.  Goal: Pt to meet >/= 90% of their estimated nutrition needs   Monitor:  PO & supplemental intake, weight, labs, I/O's  ASSESSMENT: 53 y.o. year-old patient with hx of CKD, hep C and HTN. She presents with progressive anorexia, loss of taste, leg and facial swelling, loss of energy, cold "all the time" and diarrhea over the past several months; found to be in end-stage renal failure.  Patient s/p procedure 6/30: IR FLUORO GUIDE CV LINE RIGHT   Patient s/p procedure 7/6: ARTERIOVENOUS (AV) FISTULA CREATION OF RIGHT ARM VS INSERTION OF RIGHT ARM ARTERIOVENOUS GORTEX GRAFT (R)  Pt noted to have rapidly progressive thrombocytopenia. Pt received transfusion and her thrombocytopenia is now stable.  Pt has been CLIPPED and is now undergoing HD. She states that her appetite has improved. She is eating 100% of her meals. Provided her with nutritional recommendations via the "Choose a Meal" booklet and information about renal nutrition while eating out.  Potassium WNL   Height: Ht Readings from Last 1 Encounters:  11/26/13 5\' 7"  (1.702 m)    Weight: Wt Readings from Last 1 Encounters:  12/04/13 135 lb 12.9 oz (61.6 kg)   BMI:  Body mass index is 21.26 kg/(m^2).  Estimated Nutritional Needs: Kcal:  1900-2100 Protein: 90-100 gm  Fluid: 1200 ml  Skin: Intact  Diet Order: Renal/Carbohydrate Modified with 1200 ml fluid restriction     Intake/Output Summary (Last 24 hours) at 12/04/13 1627 Last data filed at 12/04/13 1602  Gross per 24 hour  Intake    840 ml  Output   2568 ml  Net  -1728 ml    Last BM: 7/6  Labs:   Recent Labs Lab 12/02/13 0432 12/03/13 0402 12/04/13 0500  NA 139 139 135*  K 3.9 3.5* 3.9  CL 99 98 95*  CO2 28 26 24   BUN 25* 35* 48*  CREATININE 3.76* 5.27* 6.18*  CALCIUM 7.0* 7.0* 6.9*  GLUCOSE 73 80 94    CBG (last 3)   Recent Labs  12/03/13 1712 12/04/13 0800 12/04/13 1154  GLUCAP 102* 71 114*    Scheduled Meds: . amLODipine  10 mg Oral Daily  . darbepoetin (ARANESP) injection - DIALYSIS  40 mcg Intravenous Q Sat-HD  . feeding supplement (NEPRO CARB STEADY)  237 mL Oral BID BM  . metoprolol tartrate  50 mg Oral BID  . sevelamer carbonate  2,400 mg Oral TID WC  . sodium chloride  3 mL Intravenous Q12H    Continuous Infusions:   Inda Coke MS, RD, LDN Inpatient Registered Dietitian Pager: [phone] After-hours pager: [phone]

## 2013-12-04 NOTE — Progress Notes (Signed)
Patient ID: Karen Morrow, female   DOB: 03-Aug-1960, 53 y.o.   MRN: [id number] She did well with surgery Wound dressing right forearm dry Platelet count 88,000 this AM Heparin Thrombocytopenia assay finally back: negative as expected I would consider protonix or cefazolin as possible offending drugs; note: she got a peri-op dose of cefazolin again yesterday I can follow up platelets as outpatient call.[facility] [phone] at time of D/C for appt She has high hepatitis C viral load:  She would benefit from referral to hepatitis C clinic to see Dr Linus Salmons.

## 2013-12-04 NOTE — Progress Notes (Signed)
12/04/2013 3:03 PM Hemodialysis Outpatient Note; this patient has been accepted at the [facility] dialysis center on a Tuesday, Thursday and Saturday 2nd shift schedule. The center can begin treatment Thursday July 9th. Financial advisor will be meeting with patient tomorrow to file an application for medicaid. Thank you. Gordy Savers

## 2013-12-04 NOTE — Plan of Care (Signed)
Problem: Phase III Progression Outcomes Goal: Tol increased activity, up in chair for at least 4 hrs/HD pt Outcome: Completed/Met Date Met:  12/04/13 Ambulating in room and in hall. MD advised pt to be up oob as much as possible in order to build up her strength.

## 2013-12-05 ENCOUNTER — Encounter (HOSPITAL_COMMUNITY): Payer: Self-pay | Admitting: Vascular Surgery

## 2013-12-05 LAB — GLUCOSE, CAPILLARY
Glucose-Capillary: 101 mg/dL — ABNORMAL HIGH (ref 70–99)
Glucose-Capillary: 98 mg/dL (ref 70–99)
Glucose-Capillary: 92 mg/dL (ref 70–99)
Glucose-Capillary: 143 mg/dL — ABNORMAL HIGH (ref 70–99)

## 2013-12-05 LAB — CBC WITH DIFFERENTIAL/PLATELET
Basophils Absolute: 0 10*3/uL (ref 0.0–0.1)
Basophils Relative: 0 % (ref 0–1)
Eosinophils Absolute: 0.2 10*3/uL (ref 0.0–0.7)
Eosinophils Relative: 3 % (ref 0–5)
HCT: 22.9 % — ABNORMAL LOW (ref 36.0–46.0)
Hemoglobin: 7.6 g/dL — ABNORMAL LOW (ref 12.0–15.0)
Lymphocytes Relative: 22 % (ref 12–46)
Lymphs Abs: 1.8 10*3/uL (ref 0.7–4.0)
MCH: 31 pg (ref 26.0–34.0)
MCHC: 33.2 g/dL (ref 30.0–36.0)
MCV: 93.5 fL (ref 78.0–100.0)
Monocytes Absolute: 0.5 10*3/uL (ref 0.1–1.0)
Monocytes Relative: 6 % (ref 3–12)
Neutro Abs: 5.6 10*3/uL (ref 1.7–7.7)
Neutrophils Relative %: 69 % (ref 43–77)
Platelets: 27 10*3/uL — CL (ref 150–400)
RBC: 2.45 MIL/uL — ABNORMAL LOW (ref 3.87–5.11)
RDW: 14.3 % (ref 11.5–15.5)
WBC: 8 10*3/uL (ref 4.0–10.5)

## 2013-12-05 LAB — COMPREHENSIVE METABOLIC PANEL
ALT: 13 U/L (ref 0–35)
AST: 17 U/L (ref 0–37)
Albumin: 2.1 g/dL — ABNORMAL LOW (ref 3.5–5.2)
Alkaline Phosphatase: 63 U/L (ref 39–117)
Anion gap: 12 (ref 5–15)
BUN: 29 mg/dL — ABNORMAL HIGH (ref 6–23)
CO2: 28 mEq/L (ref 19–32)
Calcium: 7.4 mg/dL — ABNORMAL LOW (ref 8.4–10.5)
Chloride: 98 mEq/L (ref 96–112)
Creatinine, Ser: 4.6 mg/dL — ABNORMAL HIGH (ref 0.50–1.10)
GFR calc Af Amer: 12 mL/min — ABNORMAL LOW (ref >90)
GFR calc non Af Amer: 10 mL/min — ABNORMAL LOW (ref >90)
Glucose, Bld: 83 mg/dL (ref 70–99)
Potassium: 3.5 mEq/L — ABNORMAL LOW (ref 3.7–5.3)
Sodium: 138 mEq/L (ref 137–147)
Total Bilirubin: 0.2 mg/dL — ABNORMAL LOW (ref 0.3–1.2)
Total Protein: 5.5 g/dL — ABNORMAL LOW (ref 6.0–8.3)

## 2013-12-05 MED ORDER — METOPROLOL TARTRATE 25 MG PO TABS
25.0000 mg | ORAL_TABLET | Freq: Two times a day (BID) | ORAL | Status: DC
  Administered 2013-12-05 – 2013-12-06 (×3): 25 mg via ORAL
  Filled 2013-12-05 (×5): qty 1

## 2013-12-05 MED ORDER — CAMPHOR-MENTHOL 0.5-0.5 % EX LOTN
TOPICAL_LOTION | CUTANEOUS | Status: DC | PRN
Start: 2013-12-05 — End: 2013-12-07
  Administered 2013-12-05: 11:00:00 via TOPICAL
  Filled 2013-12-05: qty 222

## 2013-12-05 MED ORDER — METHYLPREDNISOLONE SODIUM SUCC 125 MG IJ SOLR
60.0000 mg | INTRAMUSCULAR | Status: DC
  Administered 2013-12-05: 60 mg via INTRAVENOUS
  Filled 2013-12-05 (×3): qty 0.96

## 2013-12-05 MED ORDER — DARBEPOETIN ALFA-POLYSORBATE 100 MCG/0.5ML IJ SOLN: 100.0000 ug | INTRAMUSCULAR | Status: DC

## 2013-12-05 NOTE — Progress Notes (Signed)
Assessment:  1. CKD stage V- new start to dialysis, s/p HD x 4, S/P BVT RUE, await CLIP 2. Thrombocytopenia - recurrent, felt allergic per Hematology (Ancef) 3. HTN/Volume -improved  4. Anemia - s/p Transfused 2u prbc's, on ESA rx.  5. Sec HPT - and hyper phos. Add vita D after phos better 6. Hep C diagnosed in 2011 7. Tobacco / COPD by cxr 8. Hx GIB x 2 Plan HD AM, outpt HD dispo planning  Subjective: Interval History: Plts fell again  Objective: Vital signs in last 24 hours: Temp:  [97.4 F (36.3 C)-99.1 F (37.3 C)] 98.7 F (37.1 C) (07/08 0411) Pulse Rate:  [66-97] 78 (07/08 0411) Resp:  [14-20] 17 (07/08 0411) BP: (81-134)/(56-91) 123/69 mmHg (07/08 0411) SpO2:  [92 %-100 %] 97 % (07/08 0411) Weight:  [58.4 kg (128 lb 12 oz)-61.6 kg (135 lb 12.9 oz)] 59.875 kg (132 lb) (07/08 0315) Weight change: 0.2 kg (7.1 oz)  Intake/Output from previous day: 07/07 0701 - 07/08 0700 In: 720 [P.O.:720] Out: 2568  Intake/Output this shift:    General appearance: alert and cooperative Resp: clear to auscultation bilaterally Cardio: regular rate and rhythm, S1, S2 normal, no murmur, click, rub or gallop GI: soft, non-tender; bowel sounds normal; no masses,  no organomegaly Extremities: edema 1+  Lab Results:  Recent Labs  12/04/13 0500 12/05/13 0400  WBC 9.4 8.0  HGB 7.7* 7.6*  HCT 23.0* 22.9*  PLT 88* 27*   BMET:  Recent Labs  12/04/13 0500 12/05/13 0400  NA 135* 138  K 3.9 3.5*  CL 95* 98  CO2 24 28  GLUCOSE 94 83  BUN 48* 29*  CREATININE 6.18* 4.60*  CALCIUM 6.9* 7.4*   No results found for this basename: PTH,  in the last 72 hours Iron Studies: No results found for this basename: IRON, TIBC, TRANSFERRIN, FERRITIN,  in the last 72 hours Studies/Results: No results found.  Scheduled: . amLODipine  10 mg Oral Daily  . darbepoetin (ARANESP) injection - DIALYSIS  40 mcg Intravenous Q Sat-HD  . feeding supplement (NEPRO CARB STEADY)  237 mL Oral BID BM  .  methylPREDNISolone (SOLU-MEDROL) injection  60 mg Intravenous Q24H  . metoprolol tartrate  50 mg Oral BID  . sevelamer carbonate  2,400 mg Oral TID WC  . sodium chloride  3 mL Intravenous Q12H     LOS: 9 days   Angel Weedon C 12/05/2013,10:06 AM

## 2013-12-05 NOTE — Progress Notes (Signed)
Critical platelet count of 27,000 this am. No overt bleeding. Placed on bleeding precautions. Reviewed chart, read notes, including hemoc. Tests are pending. Will defer to morning team as to transfuse platelets.  Baltazar Najjar, NP

## 2013-12-05 NOTE — Progress Notes (Signed)
CRITICAL VALUE ALERT  Critical value received:  Platelets 2  Date of notification:  12/04/2013  Time of notification:  [id number]  Critical value read back:Yes.    Nurse who received alert:  Hadassah Pais RN  MD notified (1st page):  Forrest Moron NP  Time of first page:  [phone]  MD notified (2nd page):  Time of second page:  Responding MD:  Forrest Moron NP  Time MD responded:  Put new order in

## 2013-12-05 NOTE — Progress Notes (Signed)
Patient ID: Karen Morrow, female   DOB: [birth year], 53 y.o.   MRN: [id number] Platelet count has fallen to 27,000. No active bleeding. I would not transfuse platelets at this time. My concern is that we are seeing cefazolin toxicity - initial partial platelet recovery then she was re-challenged with this drug peri-operative for vascular graft placement. I would list cefazolin as a drug allergy. Value of continuing steroids is questionable under these circumstances. She should stay in hospital until platelets start to recover again.

## 2013-12-05 NOTE — Progress Notes (Signed)
[facility]  PROGRESS NOTE     Interim History:   53 y.o. year-old patient with hx of CKD, hep C and HTN. She presents with progressive anorexia, loss of taste, leg and facial swelling, loss of energy,ED today creat is 14. In 2011 it was 0.72. In June last year is was 2.5, in Jan this year creat was 2.96 and in March creat was up to 3.6, no NSAID's due to PUD. Renal US showed Chronic medical renal disease. no neprhrotoxins , ACEi or contrast.  During her hospital stay patient was initiated on dialysis, she is close to being ESRD, she required packed RBC transfusions for her anemia which appeared to be anemia of chronic disease, she also developed severe thrombocytopenia requiring platelet transfusion x1, etiology of thrombocytopenia is unclear, hematology following the patient she seems to be responding to IV steroids. Clinically TTP, ITP, HUS have been ruled out.     HPI/Subjective:   In bed, no headache, no chest-Abd pain, no SOB, no focal weakness,  itching resolved, no bleeding or bruising. She is clinically feeling better and wants to go home.      Assessment/Plan:    AKI (acute kidney injury) - renal on board, has right subclavian dialysis catheter placed by IR on 11/27/2013, started on dialysis by renal. - Unclear etiology of AKI there are no nephrotoxin drugs, ? Due Hep C. renal following. -Post right arm fistula placement by vascular surgery on 12/03/2013     Severe thrombocytopenia happened early morning 11/29/2013 baseline platelet level was above 182 two days ago.   Etiology unclear, not on any heparin/Lovenox for prophylaxis, clinical picture does not look consistent with TTP or HUS, however cannot completely rule out, has some heparin exposure possibly during dialysis during her first 2 days hence have ordered HIT panel , noted LDH-heptoglobin and peripheral smear. No signs of bleeding.  Hematology consulted and following, she status post one pack  platelet transfusion on 11-29-2013 and another 2 units of platelets on 12/02/2013 evening per vascular surgery to facilitate right arm fistula placement.   Per hematology not consistent with ITP, TTP or HUS. Likely combination of acute renal failure, hepatitis C and possibly some viral infection. Minimized Meds. Platelet counts initially recovered in steroids were stopped, however they dropped again on 12/05/2013 after she received preop cefazolin. Question if cefazolin is the cause of thrombocytopenia. Listed as allergy. Based a gain On Solu-Medrol as she had responded to them last time, will monitor counts.      Anemia of chronic disease:  No signs of overt bleeding, stool occult blood pending as patient is constipated without any p.m., anemia panel is inconclusive, she status post 2 units of packed RBC transfusion on 11/28/2013 with stable H&H post transfusion. Have also ordered LDH, haptoglobin, peripheral smear. We'll continue to monitor H&H. On PPI.   Occult Blood -ve, DC'd PPI.     Cirrhosis of liver without mention of alcohol, suggested by abdominal US/ Hepatitis C antibody test positive - not on treatment. We'll have her follow with GI and ID post discharge. Discussed her case with ID physician Dr. Linus Salmons no indication for acute hep C treatment      Rash/itching: - Likely due to uremia now resolved.     HTN (hypertension): - Stable , placed on low-dose beta blocker.    Intermittent tachycardia.  Initially was persistent due to severe anemia, now resting tachycardia has resolved, TSH stable, echogram unremarkable, no shortness of breath. Was likely due to deconditioning  has resolved now.     Diarrhea upon admission.   Completely resolved.     Code Status: full Family Communication: none  Disposition Plan: Discharge once social worker has arranged for outpatient dialysis     Consultants:  Renal, hematology   Procedures:   HD cath 6.30.2015 Rt IJ. TDC,  Echogram with EF 60% and LVH.   Echo  -  Mild LVH with LVEF Q000111Q, grade 1 diastolic dysfunction. Trivial mitral and aortic regurgitation. Mildly dilated left atrium. Unable to assess PASP.   Right arm AV fistula placement by Dr.Early. On 12/03/2013     Antibiotics:  None     Objective: Filed Vitals:   12/04/13 1700 12/04/13 2112 12/05/13 0315 12/05/13 0411  BP: 120/80 116/77  123/69  Pulse: 86 91  78  Temp: 97.8 F (36.6 C) 99.1 F (37.3 C)  98.7 F (37.1 C)  TempSrc: Oral Oral  Oral  Resp: 17   17  Height:      Weight:   59.875 kg (132 lb)   SpO2: 93% 95%  97%    Intake/Output Summary (Last 24 hours) at 12/05/13 0850 Last data filed at 12/05/13 0222  Gross per 24 hour  Intake    720 ml  Output   2568 ml  Net  -1848 ml   Filed Weights   12/04/13 1245 12/04/13 1605 12/05/13 0315  Weight: 61.6 kg (135 lb 12.9 oz) 58.4 kg (128 lb 12 oz) 59.875 kg (132 lb)    Exam:  General: Alert, awake, oriented x3, in no acute distress.  HEENT: No bruits, no goiter. +JVD Heart: Regular rate and rhythm,  Lungs: Good air movement,clear.  Abdomen: Soft, nontender, nondistended, positive bowel sounds.  Neuro: Grossly intact, nonfocal. Extremity and skin. Right arm fistula site appears stable. Has a right IJ tunneled dialysis catheter in place.   Data Reviewed: Basic Metabolic Panel:  Recent Labs Lab 12/01/13 0455 12/02/13 0432 12/03/13 0402 12/04/13 0500 12/05/13 0400  NA 136* 139 139 135* 138  K 4.1 3.9 3.5* 3.9 3.5*  CL 99 99 98 95* 98  CO2 25 28 26 24 28   GLUCOSE 93 73 80 94 83  BUN 38* 25* 35* 48* 29*  CREATININE 6.00* 3.76* 5.27* 6.18* 4.60*  CALCIUM 7.0* 7.0* 7.0* 6.9* 7.4*   Liver Function Tests:  Recent Labs Lab 12/01/13 0455 12/02/13 0432 12/03/13 0402 12/04/13 0500 12/05/13 0400  AST 16 26 21 22 17   ALT 9 13 15 14 13   ALKPHOS 57 64 61 73 63  BILITOT <0.2* <0.2* 0.3 <0.2* 0.2*  PROT 5.5* 5.3* 5.6* 5.5* 5.5*  ALBUMIN 2.2* 2.0* 2.3* 2.1*  2.1*   No results found for this basename: LIPASE, AMYLASE,  in the last 168 hours No results found for this basename: AMMONIA,  in the last 168 hours CBC:  Recent Labs Lab 12/01/13 0455 12/02/13 0432 12/03/13 0402 12/04/13 0500 12/05/13 0400  WBC 11.9* 10.6* 10.1 9.4 8.0  NEUTROABS 9.5* 7.5 7.4 6.7 5.6  HGB 7.6* 7.8* 7.7* 7.7* 7.6*  HCT 22.1* 22.9* 22.9* 23.0* 22.9*  MCV 89.1 90.5 91.2 90.6 93.5  PLT 48* 47* 87* 88* 27*   Cardiac Enzymes: No results found for this basename: CKTOTAL, CKMB, CKMBINDEX, TROPONINI,  in the last 168 hours BNP (last 3 results)  Recent Labs  11/26/13 1732  PROBNP 11948.0*   CBG:  Recent Labs Lab 12/04/13 0800 12/04/13 1154 12/04/13 1706 12/04/13 2119 12/05/13 0734  GLUCAP 71 114* 109* 101* 98  Recent Results (from the past 240 hour(s))  MRSA PCR SCREENING     Status: None   Collection Time    11/26/13  9:20 PM      Result Value Ref Range Status   MRSA by PCR NEGATIVE  NEGATIVE Final   Comment:            The GeneXpert MRSA Assay (FDA     approved for NASAL specimens     only), is one component of a     comprehensive MRSA colonization     surveillance program. It is not     intended to diagnose MRSA     infection nor to guide or     monitor treatment for     MRSA infections.  CLOSTRIDIUM DIFFICILE BY PCR     Status: None   Collection Time    11/29/13 10:40 PM      Result Value Ref Range Status   C difficile by pcr NEGATIVE  NEGATIVE Final  STOOL CULTURE     Status: None   Collection Time    11/29/13 10:40 PM      Result Value Ref Range Status   Specimen Description STOOL   Final   Special Requests NONE   Final   Culture     Final   Value: NO SALMONELLA, SHIGELLA, CAMPYLOBACTER, YERSINIA, OR E.COLI 0157:H7 ISOLATED     Performed at [facility]   Report Status 12/03/2013 FINAL   Final  OVA AND PARASITE EXAMINATION     Status: None   Collection Time    11/29/13 10:40 PM      Result Value Ref Range Status    Specimen Description STOOL   Final   Special Requests NONE   Final   Ova and parasites     Final   Value: NO OVA OR PARASITES SEEN     Performed at [facility]   Report Status 12/04/2013 FINAL   Final  SURGICAL PCR SCREEN     Status: Abnormal   Collection Time    12/03/13  2:27 AM      Result Value Ref Range Status   MRSA, PCR NEGATIVE  NEGATIVE Final   Staphylococcus aureus POSITIVE (*) NEGATIVE Final   Comment:            The Xpert SA Assay (FDA     approved for NASAL specimens     in patients over 42 years of age),     is one component of     a comprehensive surveillance     program.  Test performance has     been validated by [employer] for patients greater     than or equal to 83 year old.     It is not intended     to diagnose infection nor to     guide or monitor treatment.     Studies: No results found.  Scheduled Meds: . amLODipine  10 mg Oral Daily  . darbepoetin (ARANESP) injection - DIALYSIS  40 mcg Intravenous Q Sat-HD  . feeding supplement (NEPRO CARB STEADY)  237 mL Oral BID BM  . methylPREDNISolone (SOLU-MEDROL) injection  60 mg Intravenous Q24H  . metoprolol tartrate  50 mg Oral BID  . sevelamer carbonate  2,400 mg Oral TID WC  . sodium chloride  3 mL Intravenous Q12H   Continuous Infusions:    Thurnell Lose  [facility] Pager [phone]. If 8PM-8AM, please contact night-coverage  at [email or web address], password [facility] 12/05/2013, 8:50 AM  LOS: 9 days      **Disclaimer: This note may have been dictated with voice recognition software. Similar sounding words can inadvertently be transcribed and this note may contain transcription errors which may not have been corrected upon publication of note.**

## 2013-12-06 LAB — RENAL FUNCTION PANEL
Albumin: 2.5 g/dL — ABNORMAL LOW (ref 3.5–5.2)
Anion gap: 17 — ABNORMAL HIGH (ref 5–15)
BUN: 39 mg/dL — ABNORMAL HIGH (ref 6–23)
CO2: 23 mEq/L (ref 19–32)
Calcium: 7.7 mg/dL — ABNORMAL LOW (ref 8.4–10.5)
Chloride: 96 mEq/L (ref 96–112)
Creatinine, Ser: 5.72 mg/dL — ABNORMAL HIGH (ref 0.50–1.10)
GFR calc Af Amer: 9 mL/min — ABNORMAL LOW (ref >90)
GFR calc non Af Amer: 8 mL/min — ABNORMAL LOW (ref >90)
Glucose, Bld: 171 mg/dL — ABNORMAL HIGH (ref 70–99)
Phosphorus: 3.3 mg/dL (ref 2.3–4.6)
Potassium: 3.6 mEq/L — ABNORMAL LOW (ref 3.7–5.3)
Sodium: 136 mEq/L — ABNORMAL LOW (ref 137–147)

## 2013-12-06 LAB — CBC WITH DIFFERENTIAL/PLATELET
Basophils Absolute: 0 10*3/uL (ref 0.0–0.1)
Basophils Relative: 0 % (ref 0–1)
Eosinophils Absolute: 0.2 10*3/uL (ref 0.0–0.7)
Eosinophils Relative: 2 % (ref 0–5)
HCT: 24.9 % — ABNORMAL LOW (ref 36.0–46.0)
Hemoglobin: 8.1 g/dL — ABNORMAL LOW (ref 12.0–15.0)
Lymphocytes Relative: 19 % (ref 12–46)
Lymphs Abs: 2.1 10*3/uL (ref 0.7–4.0)
MCH: 30.1 pg (ref 26.0–34.0)
MCHC: 32.5 g/dL (ref 30.0–36.0)
MCV: 92.6 fL (ref 78.0–100.0)
Monocytes Absolute: 0.5 10*3/uL (ref 0.1–1.0)
Monocytes Relative: 5 % (ref 3–12)
Neutro Abs: 8.5 10*3/uL — ABNORMAL HIGH (ref 1.7–7.7)
Neutrophils Relative %: 75 % (ref 43–77)
Platelets: 70 10*3/uL — ABNORMAL LOW (ref 150–400)
RBC: 2.69 MIL/uL — ABNORMAL LOW (ref 3.87–5.11)
RDW: 14.2 % (ref 11.5–15.5)
WBC: 11.4 10*3/uL — ABNORMAL HIGH (ref 4.0–10.5)

## 2013-12-06 LAB — CBC
HCT: 25 % — ABNORMAL LOW (ref 36.0–46.0)
Hemoglobin: 8.3 g/dL — ABNORMAL LOW (ref 12.0–15.0)
MCH: 31.1 pg (ref 26.0–34.0)
MCHC: 33.2 g/dL (ref 30.0–36.0)
MCV: 93.6 fL (ref 78.0–100.0)
Platelets: 63 10*3/uL — ABNORMAL LOW (ref 150–400)
RBC: 2.67 MIL/uL — ABNORMAL LOW (ref 3.87–5.11)
RDW: 14.4 % (ref 11.5–15.5)
WBC: 10 10*3/uL (ref 4.0–10.5)

## 2013-12-06 MED ORDER — SODIUM CHLORIDE 0.9 % IV SOLN: 100.0000 mL | INTRAVENOUS | Status: DC | PRN

## 2013-12-06 MED ORDER — LIDOCAINE-PRILOCAINE 2.5-2.5 % EX CREA: 1.0000 "application " | TOPICAL_CREAM | CUTANEOUS | Status: DC | PRN

## 2013-12-06 MED ORDER — RENA-VITE PO TABS
1.0000 | ORAL_TABLET | Freq: Every day | ORAL | Status: DC
  Administered 2013-12-06: 1 via ORAL
  Filled 2013-12-06: qty 1

## 2013-12-06 MED ORDER — PENTAFLUOROPROP-TETRAFLUOROETH EX AERO: 1.0000 "application " | INHALATION_SPRAY | CUTANEOUS | Status: DC | PRN

## 2013-12-06 MED ORDER — NEPRO/CARBSTEADY PO LIQD: 237.0000 mL | ORAL | Status: DC | PRN

## 2013-12-06 MED ORDER — ALTEPLASE 2 MG IJ SOLR: 2.0000 mg | Freq: Once | INTRAMUSCULAR | Status: AC | PRN

## 2013-12-06 MED ORDER — HEPARIN SODIUM (PORCINE) 1000 UNIT/ML DIALYSIS
1000.0000 [IU] | INTRAMUSCULAR | Status: DC | PRN
  Filled 2013-12-06: qty 1

## 2013-12-06 MED ORDER — HEPARIN SODIUM (PORCINE) 1000 UNIT/ML DIALYSIS: 20.0000 [IU]/kg | INTRAMUSCULAR | Status: DC | PRN

## 2013-12-06 MED ORDER — LIDOCAINE HCL (PF) 1 % IJ SOLN: 5.0000 mL | INTRAMUSCULAR | Status: DC | PRN

## 2013-12-06 NOTE — Progress Notes (Signed)
[facility] Progress Note  Subjective:   No emerging complaints. Anxious to go home.  Objective Filed Vitals:   12/05/13 0900 12/05/13 1754 12/05/13 2052 12/06/13 0441  BP: 146/97 118/78 134/80 131/84  Pulse: 81 93 88 72  Temp: 98.4 F (36.9 C) 98.7 F (37.1 C) 98.3 F (36.8 C) 98.4 F (36.9 C)  TempSrc: Tympanic Oral Oral Oral  Resp: 18 19 18 19   Height:      Weight:   60.646 kg (133 lb 11.2 oz)   SpO2: 98% 96% 98% 97%   Physical Exam General: Alsrt, cooperative, NAD Heart: RRR Lungs: Coarse rhonchi on right. Breathing unlabored Abdomen: Soft, NT, + BS Extremities: +LE pitting edema bilat.  Dialysis Access: Rt TDC, Maturing RUE AVF + bruit/ clean, intact dressing  Assessment/Plan: 1. ESRD- new start to dialysis, S/P BVT RUE, clipped to [facility] on TTS schedule. HD today. 2. Thrombocytopenia - Platelets 63, significant improvement. Recurrent, felt allergic per Hematology (Ancef) 3. HTN/Volume - SBPs 130s, BP dropping on HD. Still with some volume excess but improving. Metoprolol dose lowered to 25mg  BID. UF as BP tolerates/ 4. Anemia - Hgb 8.3 s/p Transfused 2u prbc's, Continue Aranesp 200. Tsat 29%/Ferritin 357. Will start weekly Fe at discharge. 5. Sec HPT - Ca 7.4 (8.9 corrected). Phos up, Renvela started this admit. PTH  691.8. Will add Vit D once phos improves. Renal panel. 6. Nutrition - Alb 2.1. Renal diet, multivit, supplements 7. Hep C diagnosed in 2011 8. Tobacco / COPD by cxr 9. Hx GIB x 2 10. Dispo - per primary. ? Soon. Platelets improving. Has outpatient HD chair.  Collene Leyden. Cletus Gash, PA-C [facility] Pager [phone] 12/06/2013,10:08 AM  LOS: 10 days   Renal Attending: She is doing well with dialysis.  Thrombocytopenia felt allergic.  On outpt HD schedule.  Cove Haydon C    Additional Objective Labs: Basic Metabolic Panel:  Recent Labs Lab 12/03/13 0402 12/04/13 0500 12/05/13 0400  NA 139 135* 138  K 3.5* 3.9 3.5*  CL  98 95* 98  CO2 26 24 28   GLUCOSE 80 94 83  BUN 35* 48* 29*  CREATININE 5.27* 6.18* 4.60*  CALCIUM 7.0* 6.9* 7.4*   Liver Function Tests:  Recent Labs Lab 12/03/13 0402 12/04/13 0500 12/05/13 0400  AST 21 22 17   ALT 15 14 13   ALKPHOS 61 73 63  BILITOT 0.3 <0.2* 0.2*  PROT 5.6* 5.5* 5.5*  ALBUMIN 2.3* 2.1* 2.1*   No results found for this basename: LIPASE, AMYLASE,  in the last 168 hours CBC:  Recent Labs Lab 12/02/13 0432 12/03/13 0402 12/04/13 0500 12/05/13 0400 12/06/13 0826  WBC 10.6* 10.1 9.4 8.0 10.0  NEUTROABS 7.5 7.4 6.7 5.6  --   HGB 7.8* 7.7* 7.7* 7.6* 8.3*  HCT 22.9* 22.9* 23.0* 22.9* 25.0*  MCV 90.5 91.2 90.6 93.5 93.6  PLT 47* 87* 88* 27* 63*   Blood Culture    Component Value Date/Time   SDES STOOL 11/29/2013 2240   SDES STOOL 11/29/2013 2240   SDES STOOL 11/29/2013 2240   SPECREQUEST NONE 11/29/2013 2240   SPECREQUEST NONE 11/29/2013 2240   SPECREQUEST NONE 11/29/2013 2240   CULT  Value: NO SALMONELLA, SHIGELLA, CAMPYLOBACTER, YERSINIA, OR E.COLI 0157:H7 ISOLATED Performed at [facility] 11/29/2013 2240   REPTSTATUS 11/30/2013 FINAL 11/29/2013 2240   REPTSTATUS 12/03/2013 FINAL 11/29/2013 2240   REPTSTATUS 12/04/2013 FINAL 11/29/2013 2240     No results found for this basename: CKTOTAL, CKMB, CKMBINDEX, TROPONINI,  in the last 168 hours CBG:  Recent Labs Lab 12/04/13 1706 12/04/13 2119 12/05/13 0734 12/05/13 1124 12/05/13 1653  GLUCAP 109* 101* 98 92 143*   Studies/Results: No results found. Medications:   . amLODipine  10 mg Oral Daily  . [START ON 12/08/2013] darbepoetin (ARANESP) injection - DIALYSIS  100 mcg Intravenous Q Sat-HD  . feeding supplement (NEPRO CARB STEADY)  237 mL Oral BID BM  . metoprolol tartrate  25 mg Oral BID  . multivitamin  1 tablet Oral QHS  . sevelamer carbonate  2,400 mg Oral TID WC  . sodium chloride  3 mL Intravenous Q12H

## 2013-12-06 NOTE — Progress Notes (Signed)
[facility]  PROGRESS NOTE     Interim History:   53 y.o. year-old patient with hx of CKD, hep C and HTN. She presents with progressive anorexia, loss of taste, leg and facial swelling, loss of energy,ED today creat is 14. In 2011 it was 0.72. In June last year is was 2.5, in Jan this year creat was 2.96 and in March creat was up to 3.6, no NSAID's due to PUD. Renal US showed Chronic medical renal disease. no neprhrotoxins , ACEi or contrast.  During her hospital stay patient was initiated on dialysis, she is close to being ESRD, she required packed RBC transfusions for her anemia which appeared to be anemia of chronic disease, she also developed severe thrombocytopenia requiring platelet transfusion x1, etiology of thrombocytopenia is unclear, hematology following the patient she seems to be responding to IV steroids. Clinically TTP, ITP, HUS have been ruled out.     HPI/Subjective:   In bed, no headache, no chest-Abd pain, no SOB, no focal weakness,  itching resolved, no bleeding or bruising. She is clinically feeling better and wants to go home.    Assessment/Plan:    AKI (acute kidney injury) - renal on board, has right subclavian dialysis catheter placed by IR on 11/27/2013, started on dialysis by renal. She is post right arm fistula placement by vascular surgery on 12/03/2013. - Unclear etiology of AKI there are no nephrotoxin drugs, ? Due Hep C. renal following.       Severe thrombocytopenia happened early morning 11/29/2013 baseline platelet level was above 182 two days ago.   Etiology unclear, not on any heparin/Lovenox for prophylaxis, clinical picture does not look consistent with TTP or HUS, however cannot completely rule out, has some heparin exposure possibly during dialysis during her first 2 days hence have ordered HIT panel , noted LDH-heptoglobin and peripheral smear. No signs of bleeding.  Hematology consulted and following, she status post one pack  platelet transfusion on 11-29-2013 and another 2 units of platelets on 12/02/2013 evening per vascular surgery to facilitate right arm fistula placement.   Per hematology not consistent with ITP, TTP or HUS. Likely combination of acute renal failure, hepatitis C and possibly some viral infection. Minimized Meds. Platelet counts initially recovered in steroids were stopped, however they dropped again on 12/05/2013 after she received preop cefazolin. Question if cefazolin is the cause of thrombocytopenia. I have Listed cefazolin as allergy.   Had given a dose of Solu-Medrol on 12/05/2013 and 12/06/2013 as she had benefited from Solu-Medrol in the past, platelet counts on 12/06/2013 seem to have improved. We'll hold Solu-Medrol repeat platelet count again tomorrow. If count stable she will be discharged tomorrow on 12/07/2013.      Anemia of chronic disease:  No signs of overt bleeding, stool occult blood pending as patient is constipated without any p.m., anemia panel is inconclusive, she status post 2 units of packed RBC transfusion on 11/28/2013 with stable H&H post transfusion. Have also ordered LDH, haptoglobin, peripheral smear. We'll continue to monitor H&H. On PPI.   Occult Blood -ve, DC'd PPI.     Cirrhosis of liver without mention of alcohol, suggested by abdominal US/ Hepatitis C antibody test positive - not on treatment. We'll have her follow with GI and ID post discharge. Discussed her case with ID physician Dr. Linus Salmons no indication for acute hep C treatment      Rash/itching: - Likely due to uremia now resolved.     HTN (hypertension): - Stable ,  placed on low-dose beta blocker.    Intermittent tachycardia.  Initially was persistent due to severe anemia, now resting tachycardia has resolved, TSH stable, echogram unremarkable, no shortness of breath. Was likely due to deconditioning has resolved now.     Diarrhea upon admission.   Completely resolved.     Code  Status: full Family Communication: none  Disposition Plan: Discharge once social worker has arranged for outpatient dialysis     Consultants:  Renal, hematology   Procedures:   HD cath 6.30.2015 Rt IJ. TDC, Echogram with EF 60% and LVH.   Echo  -  Mild LVH with LVEF Q000111Q, grade 1 diastolic dysfunction. Trivial mitral and aortic regurgitation. Mildly dilated left atrium. Unable to assess PASP.   Right arm AV fistula placement by Dr.Early. On 12/03/2013     Antibiotics:  None     Objective: Filed Vitals:   12/05/13 0900 12/05/13 1754 12/05/13 2052 12/06/13 0441  BP: 146/97 118/78 134/80 131/84  Pulse: 81 93 88 72  Temp: 98.4 F (36.9 C) 98.7 F (37.1 C) 98.3 F (36.8 C) 98.4 F (36.9 C)  TempSrc: Tympanic Oral Oral Oral  Resp: 18 19 18 19   Height:      Weight:   60.646 kg (133 lb 11.2 oz)   SpO2: 98% 96% 98% 97%    Intake/Output Summary (Last 24 hours) at 12/06/13 0931 Last data filed at 12/06/13 0217  Gross per 24 hour  Intake    600 ml  Output      0 ml  Net    600 ml   Filed Weights   12/04/13 1605 12/05/13 0315 12/05/13 2052  Weight: 58.4 kg (128 lb 12 oz) 59.875 kg (132 lb) 60.646 kg (133 lb 11.2 oz)    Exam:  General: Alert, awake, oriented x3, in no acute distress.  HEENT: No bruits, no goiter. +JVD Heart: Regular rate and rhythm,  Lungs: Good air movement,clear.  Abdomen: Soft, nontender, nondistended, positive bowel sounds.  Neuro: Grossly intact, nonfocal. Extremity and skin. Right arm fistula site appears stable. Has a right IJ tunneled dialysis catheter in place.   Data Reviewed: Basic Metabolic Panel:  Recent Labs Lab 12/01/13 0455 12/02/13 0432 12/03/13 0402 12/04/13 0500 12/05/13 0400  NA 136* 139 139 135* 138  K 4.1 3.9 3.5* 3.9 3.5*  CL 99 99 98 95* 98  CO2 25 28 26 24 28   GLUCOSE 93 73 80 94 83  BUN 38* 25* 35* 48* 29*  CREATININE 6.00* 3.76* 5.27* 6.18* 4.60*  CALCIUM 7.0* 7.0* 7.0* 6.9* 7.4*   Liver  Function Tests:  Recent Labs Lab 12/01/13 0455 12/02/13 0432 12/03/13 0402 12/04/13 0500 12/05/13 0400  AST 16 26 21 22 17   ALT 9 13 15 14 13   ALKPHOS 57 64 61 73 63  BILITOT <0.2* <0.2* 0.3 <0.2* 0.2*  PROT 5.5* 5.3* 5.6* 5.5* 5.5*  ALBUMIN 2.2* 2.0* 2.3* 2.1* 2.1*   No results found for this basename: LIPASE, AMYLASE,  in the last 168 hours No results found for this basename: AMMONIA,  in the last 168 hours CBC:  Recent Labs Lab 12/01/13 0455 12/02/13 0432 12/03/13 0402 12/04/13 0500 12/05/13 0400 12/06/13 0826  WBC 11.9* 10.6* 10.1 9.4 8.0 10.0  NEUTROABS 9.5* 7.5 7.4 6.7 5.6  --   HGB 7.6* 7.8* 7.7* 7.7* 7.6* 8.3*  HCT 22.1* 22.9* 22.9* 23.0* 22.9* 25.0*  MCV 89.1 90.5 91.2 90.6 93.5 93.6  PLT 48* 47* 87* 88* 27* 63*  Cardiac Enzymes: No results found for this basename: CKTOTAL, CKMB, CKMBINDEX, TROPONINI,  in the last 168 hours BNP (last 3 results)  Recent Labs  11/26/13 1732  PROBNP 11948.0*   CBG:  Recent Labs Lab 12/04/13 1706 12/04/13 2119 12/05/13 0734 12/05/13 1124 12/05/13 1653  GLUCAP 109* 101* 98 92 143*    Recent Results (from the past 240 hour(s))  MRSA PCR SCREENING     Status: None   Collection Time    11/26/13  9:20 PM      Result Value Ref Range Status   MRSA by PCR NEGATIVE  NEGATIVE Final   Comment:            The GeneXpert MRSA Assay (FDA     approved for NASAL specimens     only), is one component of a     comprehensive MRSA colonization     surveillance program. It is not     intended to diagnose MRSA     infection nor to guide or     monitor treatment for     MRSA infections.  CLOSTRIDIUM DIFFICILE BY PCR     Status: None   Collection Time    11/29/13 10:40 PM      Result Value Ref Range Status   C difficile by pcr NEGATIVE  NEGATIVE Final  STOOL CULTURE     Status: None   Collection Time    11/29/13 10:40 PM      Result Value Ref Range Status   Specimen Description STOOL   Final   Special Requests NONE    Final   Culture     Final   Value: NO SALMONELLA, SHIGELLA, CAMPYLOBACTER, YERSINIA, OR E.COLI 0157:H7 ISOLATED     Performed at [facility]   Report Status 12/03/2013 FINAL   Final  OVA AND PARASITE EXAMINATION     Status: None   Collection Time    11/29/13 10:40 PM      Result Value Ref Range Status   Specimen Description STOOL   Final   Special Requests NONE   Final   Ova and parasites     Final   Value: NO OVA OR PARASITES SEEN     Performed at [facility]   Report Status 12/04/2013 FINAL   Final  SURGICAL PCR SCREEN     Status: Abnormal   Collection Time    12/03/13  2:27 AM      Result Value Ref Range Status   MRSA, PCR NEGATIVE  NEGATIVE Final   Staphylococcus aureus POSITIVE (*) NEGATIVE Final   Comment:            The Xpert SA Assay (FDA     approved for NASAL specimens     in patients over 66 years of age),     is one component of     a comprehensive surveillance     program.  Test performance has     been validated by [employer] for patients greater     than or equal to 74 year old.     It is not intended     to diagnose infection nor to     guide or monitor treatment.     Studies: No results found.  Scheduled Meds: . amLODipine  10 mg Oral Daily  . [START ON 12/08/2013] darbepoetin (ARANESP) injection - DIALYSIS  100 mcg Intravenous Q Sat-HD  . feeding supplement (NEPRO CARB STEADY)  237  mL Oral BID BM  . metoprolol tartrate  25 mg Oral BID  . sevelamer carbonate  2,400 mg Oral TID WC  . sodium chloride  3 mL Intravenous Q12H   Continuous Infusions:    Thurnell Lose  [facility] Pager [phone]. If 8PM-8AM, please contact night-coverage at [email or web address], password [facility] 12/06/2013, 9:31 AM  LOS: 10 days      **Disclaimer: This note may have been dictated with voice recognition software. Similar sounding words can inadvertently be transcribed and this note may contain transcription errors which may not have been corrected  upon publication of note.**

## 2013-12-06 NOTE — Procedures (Signed)
I was present at this session.  I have reviewed the session itself and made appropriate changes.  Tachycardia and low bp, called about.  Appears close to dry.  Will keep even now, has 1 liter off. JD  Tabytha Gradillas L 7/9/20152:14 PM

## 2013-12-06 NOTE — Progress Notes (Addendum)
Patient ID: Karen Morrow, female   DOB: May 05, 1961, 53 y.o.   MRN: [id number] Platelet count now recovering. No active bleeding. Stable for discharge from hematology point of view She can call [phone] for followup hematology appointment. I wrote down the cefazolin and Keflex on a card for her to list as possible drug allergies.

## 2013-12-06 NOTE — Care Management Note (Addendum)
CARE MANAGEMENT NOTE 12/06/2013  Patient:  Karen Morrow,Karen Morrow   Account Number:  [id number]  Date Initiated:  12/04/2013  Documentation initiated by:  ,Eduardo  Subjective/Objective Assessment:     Action/Plan:   New start HD, application for medicaid in process and pt will d/c to home. Medicaid and disability applications completed on 12/05/2013.  12/06/2013 Met with pt and MATCH letter explained and placed on shadow chart for RN to give at d/c to assist with med. Explained to pt RN, Jay.   Anticipated DC Date:  12/07/2013   Anticipated DC Plan:  HOME/SELF CARE         Choice offered to / List presented to:             Status of service:  Completed, signed off Medicare Important Message given?  NO (If response is "NO", the following Medicare IM given date fields will be blank) Date Medicare IM given:   Medicare IM given by:   Date Additional Medicare IM given:   Additional Medicare IM given by:    Discharge Disposition:  HOME/SELF CARE  Per UR Regulation:    If discussed at Long Length of Stay Meetings, dates discussed:   12/03/2013  12/06/2013    Comments:    

## 2013-12-07 LAB — CBC WITH DIFFERENTIAL/PLATELET
Basophils Absolute: 0 10*3/uL (ref 0.0–0.1)
Basophils Relative: 0 % (ref 0–1)
Eosinophils Absolute: 0.2 10*3/uL (ref 0.0–0.7)
Eosinophils Relative: 3 % (ref 0–5)
HCT: 25.5 % — ABNORMAL LOW (ref 36.0–46.0)
Hemoglobin: 8.3 g/dL — ABNORMAL LOW (ref 12.0–15.0)
Lymphocytes Relative: 27 % (ref 12–46)
Lymphs Abs: 2.2 10*3/uL (ref 0.7–4.0)
MCH: 30.7 pg (ref 26.0–34.0)
MCHC: 32.5 g/dL (ref 30.0–36.0)
MCV: 94.4 fL (ref 78.0–100.0)
Monocytes Absolute: 0.5 10*3/uL (ref 0.1–1.0)
Monocytes Relative: 6 % (ref 3–12)
Neutro Abs: 5.1 10*3/uL (ref 1.7–7.7)
Neutrophils Relative %: 64 % (ref 43–77)
Platelets: 37 10*3/uL — ABNORMAL LOW (ref 150–400)
RBC: 2.7 MIL/uL — ABNORMAL LOW (ref 3.87–5.11)
RDW: 14.5 % (ref 11.5–15.5)
WBC: 8.1 10*3/uL (ref 4.0–10.5)

## 2013-12-07 LAB — RENAL FUNCTION PANEL
Albumin: 2.4 g/dL — ABNORMAL LOW (ref 3.5–5.2)
Anion gap: 14 (ref 5–15)
BUN: 20 mg/dL (ref 6–23)
CO2: 25 mEq/L (ref 19–32)
Calcium: 8 mg/dL — ABNORMAL LOW (ref 8.4–10.5)
Chloride: 102 mEq/L (ref 96–112)
Creatinine, Ser: 3.88 mg/dL — ABNORMAL HIGH (ref 0.50–1.10)
GFR calc Af Amer: 14 mL/min — ABNORMAL LOW (ref >90)
GFR calc non Af Amer: 12 mL/min — ABNORMAL LOW (ref >90)
Glucose, Bld: 79 mg/dL (ref 70–99)
Phosphorus: 2.8 mg/dL (ref 2.3–4.6)
Potassium: 4.4 mEq/L (ref 3.7–5.3)
Sodium: 141 mEq/L (ref 137–147)

## 2013-12-07 MED ORDER — ZOLPIDEM TARTRATE 10 MG PO TABS: 10.0000 mg | ORAL_TABLET | Freq: Every evening | ORAL | Status: DC | PRN

## 2013-12-07 MED ORDER — PREDNISONE 20 MG PO TABS: 20.0000 mg | ORAL_TABLET | Freq: Every day | ORAL | Status: DC

## 2013-12-07 MED ORDER — SEVELAMER CARBONATE 800 MG PO TABS: 2400.0000 mg | ORAL_TABLET | Freq: Three times a day (TID) | ORAL | Status: DC

## 2013-12-07 MED ORDER — AMLODIPINE BESYLATE 10 MG PO TABS: 10.0000 mg | ORAL_TABLET | Freq: Every day | ORAL | Status: DC

## 2013-12-07 MED ORDER — METOPROLOL TARTRATE 25 MG PO TABS: 25.0000 mg | ORAL_TABLET | Freq: Two times a day (BID) | ORAL | Status: DC

## 2013-12-07 NOTE — Discharge Summary (Addendum)
Karen Morrow, is a 53 y.o. female  DOB [birth year]  MRN [id number].  Admission date:  11/26/2013  Admitting Physician  Shanda Howells, MD  Discharge Date:  12/07/2013   Primary MD  No PCP Per Patient  Recommendations for primary care physician for things to follow:   Follow CBC, BMP and a 2 view chest x-ray in 3 days.   Admission Diagnosis  Uremia [586] Vomiting and diarrhea XX123456, XX123456 Metabolic acidosis, increased anion gap [276.2] Acute renal failure, unspecified acute renal failure type [584.9] Anemia, unspecified anemia type [285.9]   Discharge Diagnosis  Uremia [586] Vomiting and diarrhea XX123456, XX123456 Metabolic acidosis, increased anion gap [276.2] Acute renal failure, unspecified acute renal failure type [584.9] Anemia, unspecified anemia type [285.9]    Renal failure now ESRD, anemia of chronic disease, thrombocytopenia severe of unclear etiology.   Principal Problem:   AKI (acute kidney injury) Active Problems:   Anemia of chronic disease   Thrombocytopenia   Hepatitis C antibody test positive   Cirrhosis of liver without mention of alcohol, suggested by abdominal US   HTN (hypertension)   Malnutrition of moderate degree      Past Medical History  Diagnosis Date  . Asthma   . Hepatitis C antibody test positive   . Hypertension     "just dx'd today" (11/03/2012)  . Anginal pain     Past Surgical History  Procedure Laterality Date  . Abdominal hysterectomy      "partial" (11/03/2012)  . Esophagogastroduodenoscopy N/A 06/05/2013    Procedure: ESOPHAGOGASTRODUODENOSCOPY (EGD);  Surgeon: Winfield Cunas., MD;  Location: [facility];  Service: Endoscopy;  Laterality: N/A;  . Av fistula placement Right 12/03/2013    Procedure: CREATION OF ARTERIOVENOUS (AV) FISTULA RIGHT ARM ;  Surgeon: Rosetta Posner, MD;  Location: [facility];  Service: Vascular;  Laterality: Right;       History of present illness and  Hospital Course:     Kindly see H&P for history of present illness and admission details, please review complete Labs, Consult reports and Test reports for all details in brief  HPI  from the history and physical done on the day of admission  This is a 53 y.o. year old female with known prior history of upper GI bleeding, gastric ulcer, chronic kidney disease w/ baseline Cr around 2, tobacco abuse, remote history of drug abuse presenting with acute kidney injury, and I doubt metabolic acidosis, diarrhea, dyspnea, swelling, anemia. Patient states she still generally sick over the past 2 or 3 months. However over the past month she's had progressive diarrhea as well as dyspnea. Has also developed a diffuse pruritic rash over the skin. Has been using Benadryl for this with minimal to mild improvement. Denies any fevers or chills. Patient states she has at least 4-5 bowel movements per day that are runny. Sometimes melanotic. Noted to have been dated back in January for an upper GI bleed associated with NSAID use. Patient denies taking any ibuprofen, Motrin, Aleve, ASA,Goody powders.  Remote history of cocaine abuse greater than 15 years ago. Patient denies using. States she's had progressive dyspnea on exertion with wheezing. No chest pains or diaphoresis. Has also noticed worsening lower extremity swelling. Is still making urine.   On presentation to the ER, hemodynamically stable. Blood pressures in the 90s to 160s. 7 GERD 100% on room air. Heart rate in the 90s to 130s, but mainly in the 90s. White blood cell count 9.7, hemoglobin 7.3, creatinine 14.19, BUN 82, bicarbonate of 9, calcium of 7. ABG pending. Hemoccult pending. Chest x-ray does not show any acute infiltrate but does show borderline heart size as well as COPD and emphysematous changes. Renal consult pending.    Hospital Course    During her hospital stay patient was initiated on dialysis, she is close to being ESRD, she required packed RBC transfusions for her anemia which appeared to be anemia of chronic disease, she also developed severe thrombocytopenia requiring platelet transfusion x1, etiology of thrombocytopenia is unclear, hematology following the patient she seems to be responding to IV steroids. Clinically TTP, ITP, HUS have been ruled out.     AKI (acute kidney injury)  - renal on board, has right subclavian dialysis catheter placed by IR on 11/27/2013, started on dialysis by renal. She is post right arm fistula placement by vascular surgery on 12/03/2013.  - Unclear etiology of AKI there are no nephrotoxin drugs, ? Due Hep C.   -He will follow with renal outpatient, has been set up with outpatient dialysis center by social work. Renal medications will defer to nephrology.    Severe thrombocytopenia happened early morning 11/29/2013 baseline platelet level was above 182 two days ago.  Etiology unclear, HIV panel was negative a she was seen by hematology and workup was not consistent with ITP, TTP or HUS. There was a question of cefazolin allergy, surprisingly she had some response to Solu-Medrol. She overall received platelet transfusions twice.  I discussed her case with Dr. Beryle Beams on the day of discharge, he knows the patient well, etiology of thrombocytopenia remains unclear however he thinks patient is stable for home discharge from thrombocytopenia standpoint. She will be placed on 40 mg of prednisone and she will follow in his clinic coming Monday. Patient has been provided with instructions to follow with Dr. Beryle Beams and contact precautions/fall precautions.    Anemia of chronic disease:  No signs of overt bleeding, stool occult blood pending as patient is constipated without any p.m., anemia panel is inconclusive, she status post 2 units of packed RBC transfusion on 11/28/2013 with stable  H&H post transfusion. Have also ordered LDH, haptoglobin, peripheral smear. H&H stable. Occult blood negative. PPI was stopped due to thrombus cytopenia.    Cirrhosis of liver without mention of alcohol, suggested by abdominal US/ Hepatitis C antibody test positive  - not on treatment. We'll have her follow with GI and ID post discharge. Discussed her case with ID physician Dr. Linus Salmons no indication for acute hep C treatment.   Rash/itching:  - Likely due to uremia now resolved.    HTN (hypertension):  - Stable , placed on low-dose beta blocker on with Norvasc.   Intermittent tachycardia.  Initially was persistent due to severe anemia, now resting tachycardia has resolved, TSH stable, echogram unremarkable, no shortness of breath. Was likely due to deconditioning has resolved now.    Diarrhea upon admission. Completely resolved. Stool studies unremarkable.      Discharge Condition: stable   Follow UP  Follow-up Information  Follow up with EARLY, TODD, MD In 4 weeks. (sent message to office)    Specialty:  Vascular Surgery   Contact information:   [address] [place] [address] [phone]       Follow up with Ulla Potash., MD. Schedule an appointment as soon as possible for a visit in 3 days.   Specialty:  Nephrology   Contact information:   [place] [address] [phone]       Follow up with Dr. Beryle Beams at hematology clinic. Schedule an appointment as soon as possible for a visit in 3 days. (Call [phone])       Follow up with Scharlene Gloss, MD. Schedule an appointment as soon as possible for a visit in 1 week. (For hepatitis C)    Specialty:  Infectious Diseases   Contact information:   [address] [address] [phone]       Follow up with [facility]    . Schedule an appointment as soon as possible for a visit in 1 week.   Contact information:   [address]  [place] [address] [phone]        Discharge Instructions  and  Discharge Medications        Discharge Instructions   Discharge instructions    Complete by:  As directed   Follow with Primary MD  in 3 days   Get CBC, CMP, 2 view Chest X ray checked  by Primary MD next visit.    Activity: As tolerated with Full fall precautions use walker/cane & assistance as needed   Disposition Home     Diet: Reanl. 1.5lit/day total fluids.   Check your Weight same time everyday, if you gain over 2 pounds, or you develop in leg swelling, experience more shortness of breath or chest pain, call your Primary MD immediately. Follow Cardiac Low Salt Diet and 1.5 lit/day fluid restriction.   On your next visit with her primary care physician please Get Medicines reviewed and adjusted.  Please request your Prim.MD to go over all Hospital Tests and Procedure/Radiological results at the follow up, please get all Hospital records sent to your Prim MD by signing hospital release before you go home.   If you experience worsening of your admission symptoms, develop shortness of breath, life threatening emergency, suicidal or homicidal thoughts you must seek medical attention immediately by calling 911 or calling your MD immediately  if symptoms less severe.  You Must read complete instructions/literature along with all the possible adverse reactions/side effects for all the Medicines you take and that have been prescribed to you. Take any new Medicines after you have completely understood and accpet all the possible adverse reactions/side effects.   Do not drive, operating heavy machinery, perform activities at heights, swimming or participation in water activities or provide baby sitting services if your were admitted for syncope or siezures until you have seen by Primary MD or a Neurologist and advised to do so again.  Do not drive when taking Pain medications.    Do not take more than prescribed  Pain, Sleep and Anxiety Medications  Special Instructions: If you have smoked or chewed Tobacco  in the last 2 yrs please stop smoking, stop any regular Alcohol  and or any Recreational drug use.  Wear Seat belts while driving.   Please note  You were cared for by a hospitalist during your hospital stay. If you have any questions  about your discharge medications or the care you received while you were in the hospital after you are discharged, you can call the unit and asked to speak with the hospitalist on call if the hospitalist that took care of you is not available. Once you are discharged, your primary care physician will handle any further medical issues. Please note that NO REFILLS for any discharge medications will be authorized once you are discharged, as it is imperative that you return to your primary care physician (or establish a relationship with a primary care physician if you do not have one) for your aftercare needs so that they can reassess your need for medications and monitor your lab values.  Follow with Primary MD  in 3 days   Get CBC, CMP, 2 view Chest X ray checked  by Primary MD next visit.    Activity: As tolerated with Full fall precautions use walker/cane & assistance as needed   Disposition Home     Diet: Reanl. 1.5lit/day total fluids.   Check your Weight same time everyday, if you gain over 2 pounds, or you develop in leg swelling, experience more shortness of breath or chest pain, call your Primary MD immediately. Follow Cardiac Low Salt Diet and 1.5 lit/day fluid restriction.   On your next visit with her primary care physician please Get Medicines reviewed and adjusted.  Please request your Prim.MD to go over all Hospital Tests and Procedure/Radiological results at the follow up, please get all Hospital records sent to your Prim MD by signing hospital release before you go home.   If you experience worsening of your admission symptoms, develop shortness  of breath, life threatening emergency, suicidal or homicidal thoughts you must seek medical attention immediately by calling 911 or calling your MD immediately  if symptoms less severe.  You Must read complete instructions/literature along with all the possible adverse reactions/side effects for all the Medicines you take and that have been prescribed to you. Take any new Medicines after you have completely understood and accpet all the possible adverse reactions/side effects.   Do not drive, operating heavy machinery, perform activities at heights, swimming or participation in water activities or provide baby sitting services if your were admitted for syncope or siezures until you have seen by Primary MD or a Neurologist and advised to do so again.  Do not drive when taking Pain medications.    Do not take more than prescribed Pain, Sleep and Anxiety Medications  Special Instructions: If you have smoked or chewed Tobacco  in the last 2 yrs please stop smoking, stop any regular Alcohol  and or any Recreational drug use.  Wear Seat belts while driving.   Please note  You were cared for by a hospitalist during your hospital stay. If you have any questions about your discharge medications or the care you received while you were in the hospital after you are discharged, you can call the unit and asked to speak with the hospitalist on call if the hospitalist that took care of you is not available. Once you are discharged, your primary care physician will handle any further medical issues. Please note that NO REFILLS for any discharge medications will be authorized once you are discharged, as it is imperative that you return to your primary care physician (or establish a relationship with a primary care physician if you do not have one) for your aftercare needs so that they can reassess your need for medications and monitor your lab values.  Increase activity slowly    Complete by:  As directed              Medication List    STOP taking these medications       diphenhydrAMINE 25 MG tablet  Commonly known as:  SOMINEX     pantoprazole 40 MG tablet  Commonly known as:  PROTONIX      TAKE these medications       amLODipine 10 MG tablet  Commonly known as:  NORVASC  Take 1 tablet (10 mg total) by mouth daily.     hydrALAZINE 25 MG tablet  Commonly known as:  APRESOLINE  Take 1 tablet (25 mg total) by mouth 3 (three) times daily.     metoprolol tartrate 25 MG tablet  Commonly known as:  LOPRESSOR  Take 1 tablet (25 mg total) by mouth 2 (two) times daily.     predniSONE 20 MG tablet  Commonly known as:  DELTASONE  Take 1 tablet (20 mg total) by mouth daily with breakfast. Take 2 pills daily, then see Dr. Beryle Beams and decide about future dose.     sevelamer carbonate 800 MG tablet  Commonly known as:  RENVELA  Take 3 tablets (2,400 mg total) by mouth 3 (three) times daily with meals.     zolpidem 10 MG tablet  Commonly known as:  AMBIEN  Take 1 tablet (10 mg total) by mouth at bedtime as needed for sleep.          Diet and Activity recommendation: See Discharge Instructions above   Consults obtained - Renal, Haematology, Vas Surg   Major procedures and Radiology Reports - PLEASE review detailed and final reports for all details, in brief -     HD cath 6.30.2015 Rt IJ. TDC, Echogram with EF 60% and LVH.    Echo - Mild LVH with LVEF [id number], grade 1 diastolic dysfunction. Trivial mitral and aortic regurgitation. Mildly dilated left atrium. Unable to assess PASP.    Right arm AV fistula placement by Dr.Early. On 12/03/2013     Dg Chest 2 View  11/26/2013   CLINICAL DATA:  Two week history of shortness of breath, rash, and swelling of the legs and face. Smoker with current history of hypertension.  EXAM: CHEST  2 VIEW  COMPARISON:  11/03/2012.  FINDINGS: Cardiac silhouette upper normal in size to slightly enlarged. Hilar and mediastinal contours  otherwise unremarkable. Moderate to marked hyperinflation, more so than on the prior examination, now with likely air cysts or blebs in the right middle lobe. Lungs clear. Bronchovascular markings normal. Pulmonary vascularity normal. No visible pleural effusions. No pneumothorax. Visualized bony thorax intact. Nipple shadows account for apparent nodular opacities in the lung bases on the AP view.  IMPRESSION: Borderline heart size. COPD/emphysema. No acute cardiopulmonary disease.   Electronically Signed   By: Evangeline Dakin M.D.   On: 11/26/2013 15:32   US Renal  11/27/2013   CLINICAL DATA:  Severe renal failure.  EXAM: RENAL/URINARY TRACT ULTRASOUND COMPLETE  COMPARISON:  11/03/2012  FINDINGS: Right Kidney:  Length: 9 cm. There is markedly increased echogenicity of the cortex, similar to prior. No hydronephrosis or solid mass. There may be trace edema around the right kidney, versus trace hepatorenal fossa ascites.  Left Kidney:  Length: 9 cm. Markedly increased echogenicity of the cortex, similar to prior. No hydronephrosis. There is an 18 mm simple appearing cyst in the interpolar region.  Bladder:  Appears normal for degree of bladder distention. Bilateral  ureteral jets are noted.  IMPRESSION: Chronic medical renal disease.  No hydronephrosis.   Electronically Signed   By: Jorje Guild M.D.   On: 11/27/2013 05:35   Ir Fluoro Guide Cv Line Right  11/27/2013   INDICATION: Acute on chronic kidney disease.  Patient is starting dialysis.  EXAM: FLUOROSCOPIC AND ULTRASOUND GUIDED PLACEMENT OF A TUNNELED DIALYSIS CATHETER  Physician: Stephan Minister. Henn, MD  FLUOROSCOPY TIME:  36 seconds  MEDICATIONS: 2 g Ancef, 2 mg versed, 100 mcg fentanyl. A radiology nurse monitored the patient for moderate sedation. As antibiotic prophylaxis, Ancef was ordered pre-procedure and administered intravenously within one hour of incision.  ANESTHESIA/SEDATION: Moderate sedation time: 25 minutes  PROCEDURE: Informed consent was  obtained for placement of a tunneled dialysis catheter. The patient was placed supine on the interventional table. Ultrasound confirmed a patent right internal jugularvein. Ultrasound images were obtained for documentation. The right side of the neck was prepped and draped in a sterile fashion. The right side of the neck was anesthetized with 1% lidocaine. Maximal barrier sterile technique was utilized including caps, mask, sterile gowns, sterile gloves, sterile drape, hand hygiene and skin antiseptic. A small incision was made with #11 blade scalpel. A 21 gauge needle directed into the right internal jugular vein with ultrasound guidance. A micropuncture dilator set was placed. A 19 cm tip to cuff HemoSplit catheter was selected. The skin below the right clavicle was anesthetized and a small incision was made with an #11 blade scalpel. A subcutaneous tunnel was formed to the vein dermatotomy site. The catheter was brought through the tunnel. The vein dermatotomy site was dilated to accommodate a peel-away sheath. The catheter was placed through the peel-away sheath and directed into the central venous structures. The tip of the catheter was placed in the lower SVC with fluoroscopy. Fluoroscopic images were obtained for documentation. Both lumens were found to aspirate and flush well. The proper amount of heparin was flushed in both lumens. The vein dermatotomy site was closed using a single layer of absorbable suture and Dermabond. The catheter was secured to the skin using Prolene suture. Gel-Foam was placed within the subcutaneous tract.  FINDINGS: Catheter tip in the lower SVC. Patient developed transient tachycardia following the catheter placement. The heart rhythm appeared to be regular with a rate between 130-140 bpm. The tachycardia lasted a couple of minutes then the heart rate returned to baseline. Patient was asymptomatic during this transient episode of tachycardia.  COMPLICATIONS: None  IMPRESSION:  Successful placement of a right internal jugular tunneled dialysis catheter using ultrasound and fluoroscopic guidance.   Electronically Signed   By: Markus Daft M.D.   On: 11/27/2013 11:46   Ir US Guide Vasc Access Right  11/27/2013   INDICATION: Acute on chronic kidney disease.  Patient is starting dialysis.  EXAM: FLUOROSCOPIC AND ULTRASOUND GUIDED PLACEMENT OF A TUNNELED DIALYSIS CATHETER  Physician: Stephan Minister. Henn, MD  FLUOROSCOPY TIME:  36 seconds  MEDICATIONS: 2 g Ancef, 2 mg versed, 100 mcg fentanyl. A radiology nurse monitored the patient for moderate sedation. As antibiotic prophylaxis, Ancef was ordered pre-procedure and administered intravenously within one hour of incision.  ANESTHESIA/SEDATION: Moderate sedation time: 25 minutes  PROCEDURE: Informed consent was obtained for placement of a tunneled dialysis catheter. The patient was placed supine on the interventional table. Ultrasound confirmed a patent right internal jugularvein. Ultrasound images were obtained for documentation. The right side of the neck was prepped and draped in a sterile fashion. The right side  of the neck was anesthetized with 1% lidocaine. Maximal barrier sterile technique was utilized including caps, mask, sterile gowns, sterile gloves, sterile drape, hand hygiene and skin antiseptic. A small incision was made with #11 blade scalpel. A 21 gauge needle directed into the right internal jugular vein with ultrasound guidance. A micropuncture dilator set was placed. A 19 cm tip to cuff HemoSplit catheter was selected. The skin below the right clavicle was anesthetized and a small incision was made with an #11 blade scalpel. A subcutaneous tunnel was formed to the vein dermatotomy site. The catheter was brought through the tunnel. The vein dermatotomy site was dilated to accommodate a peel-away sheath. The catheter was placed through the peel-away sheath and directed into the central venous structures. The tip of the catheter was  placed in the lower SVC with fluoroscopy. Fluoroscopic images were obtained for documentation. Both lumens were found to aspirate and flush well. The proper amount of heparin was flushed in both lumens. The vein dermatotomy site was closed using a single layer of absorbable suture and Dermabond. The catheter was secured to the skin using Prolene suture. Gel-Foam was placed within the subcutaneous tract.  FINDINGS: Catheter tip in the lower SVC. Patient developed transient tachycardia following the catheter placement. The heart rhythm appeared to be regular with a rate between 130-140 bpm. The tachycardia lasted a couple of minutes then the heart rate returned to baseline. Patient was asymptomatic during this transient episode of tachycardia.  COMPLICATIONS: None  IMPRESSION: Successful placement of a right internal jugular tunneled dialysis catheter using ultrasound and fluoroscopic guidance.   Electronically Signed   By: Markus Daft M.D.   On: 11/27/2013 11:46    Micro Results     Recent Results (from the past 240 hour(s))  CLOSTRIDIUM DIFFICILE BY PCR     Status: None   Collection Time    11/29/13 10:40 PM      Result Value Ref Range Status   C difficile by pcr NEGATIVE  NEGATIVE Final  STOOL CULTURE     Status: None   Collection Time    11/29/13 10:40 PM      Result Value Ref Range Status   Specimen Description STOOL   Final   Special Requests NONE   Final   Culture     Final   Value: NO SALMONELLA, SHIGELLA, CAMPYLOBACTER, YERSINIA, OR E.COLI 0157:H7 ISOLATED     Performed at [facility]   Report Status 12/03/2013 FINAL   Final  OVA AND PARASITE EXAMINATION     Status: None   Collection Time    11/29/13 10:40 PM      Result Value Ref Range Status   Specimen Description STOOL   Final   Special Requests NONE   Final   Ova and parasites     Final   Value: NO OVA OR PARASITES SEEN     Performed at [facility]   Report Status 12/04/2013 FINAL   Final  SURGICAL PCR  SCREEN     Status: Abnormal   Collection Time    12/03/13  2:27 AM      Result Value Ref Range Status   MRSA, PCR NEGATIVE  NEGATIVE Final   Staphylococcus aureus POSITIVE (*) NEGATIVE Final   Comment:            The Xpert SA Assay (FDA     approved for NASAL specimens     in patients over 70 years of age),  is one component of     a comprehensive surveillance     program.  Test performance has     been validated by [facility] for patients greater     than or equal to 94 year old.     It is not intended     to diagnose infection nor to     guide or monitor treatment.       Today   Subjective:   Karen Morrow today has no headache,no chest abdominal pain,no new weakness tingling or numbness, feels much better wants to go home today.   Objective:   Blood pressure 119/93, pulse 90, temperature 98.9 F (37.2 C), temperature source Oral, resp. rate 16, height 5\' 7"  (1.702 m), weight 60.6 kg (133 lb 9.6 oz), SpO2 100.00%.   Intake/Output Summary (Last 24 hours) at 12/07/13 0856 Last data filed at 12/06/13 1830  Gross per 24 hour  Intake    720 ml  Output    891 ml  Net   -171 ml    Exam Awake Alert, Oriented x 3, No new F.N deficits, Normal affect [place].AT,PERRAL Supple Neck,No JVD, No cervical lymphadenopathy appriciated.  Symmetrical Chest wall movement, Good air movement bilaterally, CTAB RRR,No Gallops,Rubs or new Murmurs, No Parasternal Heave +ve B.Sounds, Abd Soft, Non tender, No organomegaly appriciated, No rebound -guarding or rigidity. No Cyanosis, Clubbing or edema, No new Rash or bruise, R IJ dialysis catheter, right arm dialysis fistula site stable  Data Review   CBC w Diff: Lab Results  Component Value Date   WBC 8.1 12/07/2013   HGB 8.3* 12/07/2013   HCT 25.5* 12/07/2013   PLT 37* 12/07/2013   LYMPHOPCT 27 12/07/2013   MONOPCT 6 12/07/2013   EOSPCT 3 12/07/2013   BASOPCT 0 12/07/2013    CMP: Lab Results  Component Value Date   NA 141  12/07/2013   K 4.4 12/07/2013   CL 102 12/07/2013   CO2 25 12/07/2013   BUN 20 12/07/2013   CREATININE 3.88* 12/07/2013   CREATININE 3.61* 08/09/2013   PROT 5.5* 12/05/2013   ALBUMIN 2.4* 12/07/2013   BILITOT 0.2* 12/05/2013   ALKPHOS 63 12/05/2013   AST 17 12/05/2013   ALT 13 12/05/2013  .   Total Time in preparing paper work, data evaluation and todays exam - 35 minutes  Thurnell Lose M.D on 12/07/2013 at 8:56 AM  [facility] Office  [phone]   **Disclaimer: This note may have been dictated with voice recognition software. Similar sounding words can inadvertently be transcribed and this note may contain transcription errors which may not have been corrected upon publication of note.**

## 2013-12-07 NOTE — Discharge Instructions (Signed)
Follow with Primary MD  in 3 days   Get CBC, CMP, 2 view Chest X ray checked  by Primary MD next visit.    Activity: As tolerated with Full fall precautions use walker/cane & assistance as needed   Disposition Home     Diet: Reanl. 1.5lit/day total fluids.   Check your Weight same time everyday, if you gain over 2 pounds, or you develop in leg swelling, experience more shortness of breath or chest pain, call your Primary MD immediately. Follow Cardiac Low Salt Diet and 1.5 lit/day fluid restriction.   On your next visit with her primary care physician please Get Medicines reviewed and adjusted.  Please request your Prim.MD to go over all Hospital Tests and Procedure/Radiological results at the follow up, please get all Hospital records sent to your Prim MD by signing hospital release before you go home.   If you experience worsening of your admission symptoms, develop shortness of breath, life threatening emergency, suicidal or homicidal thoughts you must seek medical attention immediately by calling 911 or calling your MD immediately  if symptoms less severe.  You Must read complete instructions/literature along with all the possible adverse reactions/side effects for all the Medicines you take and that have been prescribed to you. Take any new Medicines after you have completely understood and accpet all the possible adverse reactions/side effects.   Do not drive, operating heavy machinery, perform activities at heights, swimming or participation in water activities or provide baby sitting services if your were admitted for syncope or siezures until you have seen by Primary MD or a Neurologist and advised to do so again.  Do not drive when taking Pain medications.    Do not take more than prescribed Pain, Sleep and Anxiety Medications  Special Instructions: If you have smoked or chewed Tobacco  in the last 2 yrs please stop smoking, stop any regular Alcohol  and or any Recreational  drug use.  Wear Seat belts while driving.   Please note  You were cared for by a hospitalist during your hospital stay. If you have any questions about your discharge medications or the care you received while you were in the hospital after you are discharged, you can call the unit and asked to speak with the hospitalist on call if the hospitalist that took care of you is not available. Once you are discharged, your primary care physician will handle any further medical issues. Please note that NO REFILLS for any discharge medications will be authorized once you are discharged, as it is imperative that you return to your primary care physician (or establish a relationship with a primary care physician if you do not have one) for your aftercare needs so that they can reassess your need for medications and monitor your lab values.

## 2013-12-07 NOTE — Progress Notes (Signed)
Patient Discharge: Patient discharged to home via w/c accompanied by the porter.  Hemodynamically stable with no pain or any distress noted. Patient was given given information about outpt dialysis and also given document of match medication assistance card.   Education:  Nurse educated about fluid overload and educated about her discharge handouts.   IV: Removed by the patient herself, before the discharge.  (without notifying the nurse) Telemetry: Removed by the nurse and informed CCMD before discharge Follow-up appointments: Reviewed with the patient. Prescriptions: Reviewed with the patient Belongings: Patient took all her belongings with her.

## 2014-01-01 ENCOUNTER — Other Ambulatory Visit (HOSPITAL_COMMUNITY): Payer: Self-pay

## 2014-01-07 ENCOUNTER — Encounter: Payer: Self-pay | Admitting: Vascular Surgery

## 2014-01-08 ENCOUNTER — Encounter: Payer: Self-pay | Admitting: Vascular Surgery

## 2014-01-09 ENCOUNTER — Other Ambulatory Visit: Payer: Self-pay

## 2014-01-29 DIAGNOSIS — N186 End stage renal disease: Secondary | ICD-10-CM | POA: Diagnosis not present

## 2014-01-29 DIAGNOSIS — D509 Iron deficiency anemia, unspecified: Secondary | ICD-10-CM | POA: Diagnosis not present

## 2014-01-29 DIAGNOSIS — Z23 Encounter for immunization: Secondary | ICD-10-CM | POA: Diagnosis not present

## 2014-01-29 DIAGNOSIS — R509 Fever, unspecified: Secondary | ICD-10-CM | POA: Diagnosis not present

## 2014-01-29 DIAGNOSIS — N2581 Secondary hyperparathyroidism of renal origin: Secondary | ICD-10-CM | POA: Diagnosis not present

## 2014-01-29 DIAGNOSIS — D631 Anemia in chronic kidney disease: Secondary | ICD-10-CM | POA: Diagnosis not present

## 2014-02-22 ENCOUNTER — Encounter: Payer: Self-pay | Admitting: Surgery

## 2014-02-25 ENCOUNTER — Ambulatory Visit (HOSPITAL_COMMUNITY)
Admission: RE | Admit: 2014-02-25 | Discharge: 2014-02-25 | Disposition: A | Discharge status: Home / Self Care | Payer: Medicare Other | Source: Ambulatory Visit | Attending: Surgery | Admitting: Surgery

## 2014-02-25 ENCOUNTER — Ambulatory Visit (INDEPENDENT_AMBULATORY_CARE_PROVIDER_SITE_OTHER): Payer: Medicare Other | Admitting: Surgery

## 2014-02-25 ENCOUNTER — Other Ambulatory Visit: Payer: Self-pay

## 2014-02-25 ENCOUNTER — Encounter: Payer: Self-pay | Admitting: Surgery

## 2014-02-25 VITALS — BP 204/125 | HR 80 | Ht 67.0 in | Wt 135.0 lb

## 2014-02-25 DIAGNOSIS — N186 End stage renal disease: Secondary | ICD-10-CM

## 2014-02-25 DIAGNOSIS — Z48812 Encounter for surgical aftercare following surgery on the circulatory system: Secondary | ICD-10-CM | POA: Diagnosis not present

## 2014-02-25 DIAGNOSIS — Z992 Dependence on renal dialysis: Secondary | ICD-10-CM | POA: Insufficient documentation

## 2014-02-25 NOTE — Progress Notes (Signed)
Patient name: Karen Morrow MRN: [id number] DOB: Jan 27, 1961 Sex: female     Chief Complaint  Patient presents with  . Re-evaluation    4 wk f/u     HISTORY OF PRESENT ILLNESS:  patient comes back today for followup.  On 12/03/2013, Dr. early performed a first stage right basilic vein transposition.  The patient states she will occasionally get numbness in her right hand but there exercises gets better.  She currently has a right-sided catheter for dialysis.  Past Medical History  Diagnosis Date  . Asthma   . Hepatitis C antibody test positive   . Hypertension     "just dx'd today" (11/03/2012)  . Anginal pain     Past Surgical History  Procedure Laterality Date  . Abdominal hysterectomy      "partial" (11/03/2012)  . Esophagogastroduodenoscopy N/A 06/05/2013    Procedure: ESOPHAGOGASTRODUODENOSCOPY (EGD);  Surgeon: Winfield Cunas., MD;  Location: [facility];  Service: Endoscopy;  Laterality: N/A;  . Av fistula placement Right 12/03/2013    Procedure: CREATION OF ARTERIOVENOUS (AV) FISTULA RIGHT ARM ;  Surgeon: Rosetta Posner, MD;  Location: [facility];  Service: Vascular;  Laterality: Right;    History   Social History  . Marital Status: Married    Spouse Name: N/A    Number of Children: N/A  . Years of Education: N/A   Occupational History  . Not on file.   Social History Main Topics  . Smoking status: Current Every Day Smoker -- 0.50 packs/day for 20 years    Types: Cigarettes  . Smokeless tobacco: Never Used  . Alcohol Use: 27.6 oz/week    46 Cans of beer per week     Comment: 11/03/2012 "1-2 40oz beers/day maybe"  . Drug Use: Yes    Special: Marijuana     Comment: 11/03/2012 "maybe once or twice/wk"  . Sexual Activity: Not Currently   Other Topics Concern  . Not on file   Social History Narrative  . No narrative on file    No family history on file.  Allergies as of 02/25/2014 - Review Complete 02/25/2014  Allergen Reaction Noted  . Cefazolin   12/05/2013    Current Outpatient Prescriptions on File Prior to Visit  Medication Sig Dispense Refill  . hydrALAZINE (APRESOLINE) 25 MG tablet Take 1 tablet (25 mg total) by mouth 3 (three) times daily.  90 tablet  5  . sevelamer carbonate (RENVELA) 800 MG tablet Take 3 tablets (2,400 mg total) by mouth 3 (three) times daily with meals.      Marland Kitchen amLODipine (NORVASC) 10 MG tablet Take 1 tablet (10 mg total) by mouth daily.  30 tablet  0  . metoprolol tartrate (LOPRESSOR) 25 MG tablet Take 1 tablet (25 mg total) by mouth 2 (two) times daily.  60 tablet  0  . predniSONE (DELTASONE) 20 MG tablet Take 1 tablet (20 mg total) by mouth daily with breakfast. Take 2 pills daily, then see Dr. Beryle Beams and decide about future dose.  30 tablet  0  . zolpidem (AMBIEN) 10 MG tablet Take 1 tablet (10 mg total) by mouth at bedtime as needed for sleep.  30 tablet  3  . zolpidem (AMBIEN) 10 MG tablet Take 1 tablet (10 mg total) by mouth at bedtime as needed for sleep.  20 tablet  0   No current facility-administered medications on file prior to visit.     REVIEW OF SYSTEMS: Cardiovascular: No chest pain,  chest pressure, palpitations, orthopnea, or dyspnea on exertion.  Integumentary: No rashes or ulcers. Constitutional: No fever or chills.  PHYSICAL EXAMINATION:   Vital signs are BP 204/125  Pulse 80  Ht 5\' 7"  (1.702 m)  Wt 135 lb (61.236 kg)  BMI 21.14 kg/m2  SpO2 100% General: The patient appears their stated age. HEENT:  No gross abnormalities Pulmonary:  Non labored breathing Musculoskeletal: There are no major deformities. Neurologic: No focal weakness or paresthesias are detected, Skin: There are no ulcer or rashes noted. Psychiatric: The patient has normal affect. Cardiovascular: There is a regular rate and rhythm without significant murmur appreciated.   Diagnostic Studies Ultrasound was reviewed today.  This shows a basilic vein with excellent diameters, all greater than 0.55 cm.   There are multiple branches  Assessment: End-stage renal disease Plan: The patient has completed a successful first stage basilic vein transposition.  I discussed proceeding with the second stage of the procedure for elevation of the fistula.  This will be scheduled for Dr. early on nondialysis day.  Eldridge Abrahams, M.D. [facility] Office: [phone] Pager:  [phone]

## 2014-02-27 DIAGNOSIS — N186 End stage renal disease: Secondary | ICD-10-CM | POA: Diagnosis not present

## 2014-02-28 DIAGNOSIS — Z23 Encounter for immunization: Secondary | ICD-10-CM | POA: Diagnosis not present

## 2014-02-28 DIAGNOSIS — N2581 Secondary hyperparathyroidism of renal origin: Secondary | ICD-10-CM | POA: Diagnosis not present

## 2014-02-28 DIAGNOSIS — D509 Iron deficiency anemia, unspecified: Secondary | ICD-10-CM | POA: Diagnosis not present

## 2014-02-28 DIAGNOSIS — D631 Anemia in chronic kidney disease: Secondary | ICD-10-CM | POA: Diagnosis not present

## 2014-02-28 DIAGNOSIS — N186 End stage renal disease: Secondary | ICD-10-CM | POA: Diagnosis not present

## 2014-03-02 DIAGNOSIS — N186 End stage renal disease: Secondary | ICD-10-CM | POA: Diagnosis not present

## 2014-03-02 DIAGNOSIS — Z23 Encounter for immunization: Secondary | ICD-10-CM | POA: Diagnosis not present

## 2014-03-02 DIAGNOSIS — N2581 Secondary hyperparathyroidism of renal origin: Secondary | ICD-10-CM | POA: Diagnosis not present

## 2014-03-02 DIAGNOSIS — D631 Anemia in chronic kidney disease: Secondary | ICD-10-CM | POA: Diagnosis not present

## 2014-03-02 DIAGNOSIS — D509 Iron deficiency anemia, unspecified: Secondary | ICD-10-CM | POA: Diagnosis not present

## 2014-03-05 ENCOUNTER — Encounter (HOSPITAL_COMMUNITY): Payer: Self-pay | Admitting: Pharmacy Technician

## 2014-03-05 DIAGNOSIS — D631 Anemia in chronic kidney disease: Secondary | ICD-10-CM | POA: Diagnosis not present

## 2014-03-05 DIAGNOSIS — Z23 Encounter for immunization: Secondary | ICD-10-CM | POA: Diagnosis not present

## 2014-03-05 DIAGNOSIS — D509 Iron deficiency anemia, unspecified: Secondary | ICD-10-CM | POA: Diagnosis not present

## 2014-03-05 DIAGNOSIS — N2581 Secondary hyperparathyroidism of renal origin: Secondary | ICD-10-CM | POA: Diagnosis not present

## 2014-03-05 DIAGNOSIS — N186 End stage renal disease: Secondary | ICD-10-CM | POA: Diagnosis not present

## 2014-03-05 MED ORDER — SODIUM CHLORIDE 0.9 % IV SOLN: INTRAVENOUS | Status: DC

## 2014-03-05 MED ORDER — CHLORHEXIDINE GLUCONATE CLOTH 2 % EX PADS: 6.0000 | MEDICATED_PAD | Freq: Once | CUTANEOUS | Status: DC

## 2014-03-05 MED ORDER — VANCOMYCIN HCL IN DEXTROSE 1-5 GM/200ML-% IV SOLN
1000.0000 mg | INTRAVENOUS | Status: AC
  Administered 2014-03-06: 1000 mg via INTRAVENOUS
  Filled 2014-03-05: qty 200

## 2014-03-05 NOTE — Progress Notes (Signed)
Mrs Kesner cut SDW call short, "just tell me what you have to tell me, Im on the [employer] bus and I don't get good service at home."  I instructed patient on arrival time of 0630, "they told me earlier, and I told the [employer] driver when to pick me up."  I explained to patient that we open at [place].

## 2014-03-05 NOTE — Anesthesia Preprocedure Evaluation (Addendum)
Anesthesia Evaluation  Patient identified by MRN, date of birth, ID band Patient awake    Reviewed: Allergy & Precautions, H&P , NPO status   Airway Mallampati: II      Dental  (+) Teeth Intact   Pulmonary asthma , Current Smoker,  breath sounds clear to auscultation        Cardiovascular hypertension, Pt. on medications Rhythm:Regular Rate:Normal  ECHO 10/2013 EF 65%   Neuro/Psych    GI/Hepatic   Endo/Other    Renal/GU ESRF and DialysisRenal disease     Musculoskeletal   Abdominal (+)  Abdomen: soft.    Peds  Hematology  (+) anemia , Chronic anemia,    Anesthesia Other Findings   Reproductive/Obstetrics                         Anesthesia Physical Anesthesia Plan  ASA: III  Anesthesia Plan: MAC   Post-op Pain Management:    Induction: Intravenous  Airway Management Planned: Nasal Cannula  Additional Equipment:   Intra-op Plan:   Post-operative Plan:   Informed Consent: I have reviewed the patients History and Physical, chart, labs and discussed the procedure including the risks, benefits and alternatives for the proposed anesthesia with the patient or authorized representative who has indicated his/her understanding and acceptance.     Plan Discussed with: Anesthesiologist and Surgeon  Anesthesia Plan Comments: (Need to check resent labs, chronic anemia, smoker, Hepatitis C, Cirrhosis, ESRD, had MAC 7/15 fent, versed, propofol)       Anesthesia Quick Evaluation

## 2014-03-06 ENCOUNTER — Ambulatory Visit (HOSPITAL_COMMUNITY): Payer: Medicare Other | Admitting: Anesthesiology

## 2014-03-06 ENCOUNTER — Encounter (HOSPITAL_COMMUNITY): Payer: Medicare Other | Admitting: Anesthesiology

## 2014-03-06 ENCOUNTER — Ambulatory Visit (HOSPITAL_COMMUNITY)
Admission: RE | Admit: 2014-03-06 | Discharge: 2014-03-06 | Disposition: A | Discharge status: Home / Self Care | Payer: Medicare Other | Source: Ambulatory Visit | Attending: Vascular Surgery | Admitting: Vascular Surgery

## 2014-03-06 ENCOUNTER — Telehealth: Payer: Self-pay | Admitting: Vascular Surgery

## 2014-03-06 ENCOUNTER — Encounter (HOSPITAL_COMMUNITY)
Admission: RE | Disposition: A | Discharge status: Home / Self Care | Payer: Self-pay | Source: Ambulatory Visit | Attending: Vascular Surgery

## 2014-03-06 ENCOUNTER — Encounter (HOSPITAL_COMMUNITY): Payer: Self-pay | Admitting: *Deleted

## 2014-03-06 DIAGNOSIS — Z881 Allergy status to other antibiotic agents status: Secondary | ICD-10-CM | POA: Insufficient documentation

## 2014-03-06 DIAGNOSIS — N186 End stage renal disease: Secondary | ICD-10-CM | POA: Diagnosis not present

## 2014-03-06 DIAGNOSIS — J45909 Unspecified asthma, uncomplicated: Secondary | ICD-10-CM | POA: Insufficient documentation

## 2014-03-06 DIAGNOSIS — I120 Hypertensive chronic kidney disease with stage 5 chronic kidney disease or end stage renal disease: Secondary | ICD-10-CM | POA: Diagnosis not present

## 2014-03-06 DIAGNOSIS — D649 Anemia, unspecified: Secondary | ICD-10-CM | POA: Diagnosis not present

## 2014-03-06 DIAGNOSIS — Z720 Tobacco use: Secondary | ICD-10-CM | POA: Diagnosis not present

## 2014-03-06 HISTORY — DX: Chronic kidney disease, unspecified: N18.9

## 2014-03-06 HISTORY — PX: BASCILIC VEIN TRANSPOSITION: SHX5742

## 2014-03-06 LAB — POCT I-STAT 4, (NA,K, GLUC, HGB,HCT)
Glucose, Bld: 85 mg/dL (ref 70–99)
HCT: 39 % (ref 36.0–46.0)
Hemoglobin: 13.3 g/dL (ref 12.0–15.0)
Potassium: 4 mEq/L (ref 3.7–5.3)
Sodium: 139 mEq/L (ref 137–147)

## 2014-03-06 SURGERY — TRANSPOSITION, VEIN, BASILIC
Anesthesia: Monitor Anesthesia Care | Laterality: Right

## 2014-03-06 MED ORDER — PROPOFOL 10 MG/ML IV BOLUS
INTRAVENOUS | Status: AC
  Filled 2014-03-06: qty 20

## 2014-03-06 MED ORDER — 0.9 % SODIUM CHLORIDE (POUR BTL) OPTIME
TOPICAL | Status: DC | PRN
  Administered 2014-03-06: 1000 mL

## 2014-03-06 MED ORDER — SODIUM CHLORIDE 0.9 % IV SOLN
INTRAVENOUS | Status: DC | PRN
  Administered 2014-03-06: 08:00:00 via INTRAVENOUS

## 2014-03-06 MED ORDER — FENTANYL CITRATE 0.05 MG/ML IJ SOLN
INTRAMUSCULAR | Status: DC | PRN
  Administered 2014-03-06 (×8): 25 ug via INTRAVENOUS
  Administered 2014-03-06: 50 ug via INTRAVENOUS

## 2014-03-06 MED ORDER — FENTANYL CITRATE 0.05 MG/ML IJ SOLN
25.0000 ug | INTRAMUSCULAR | Status: DC | PRN
  Administered 2014-03-06: 25 ug via INTRAVENOUS

## 2014-03-06 MED ORDER — MIDAZOLAM HCL 2 MG/2ML IJ SOLN
INTRAMUSCULAR | Status: AC
  Filled 2014-03-06: qty 2

## 2014-03-06 MED ORDER — PROMETHAZINE HCL 25 MG/ML IJ SOLN
6.2500 mg | INTRAMUSCULAR | Status: DC | PRN
Start: 2014-03-06 — End: 2014-03-06

## 2014-03-06 MED ORDER — LIDOCAINE-EPINEPHRINE 0.5 %-1:200000 IJ SOLN
INTRAMUSCULAR | Status: DC | PRN
  Administered 2014-03-06: 20 mL

## 2014-03-06 MED ORDER — LIDOCAINE-EPINEPHRINE 0.5 %-1:200000 IJ SOLN
INTRAMUSCULAR | Status: AC
  Filled 2014-03-06: qty 1

## 2014-03-06 MED ORDER — FENTANYL CITRATE 0.05 MG/ML IJ SOLN
INTRAMUSCULAR | Status: AC
  Filled 2014-03-06: qty 5

## 2014-03-06 MED ORDER — PROPOFOL INFUSION 10 MG/ML OPTIME
INTRAVENOUS | Status: DC | PRN
  Administered 2014-03-06: 75 ug/kg/min via INTRAVENOUS

## 2014-03-06 MED ORDER — SODIUM CHLORIDE 0.9 % IR SOLN
Status: DC | PRN
  Administered 2014-03-06: 09:00:00

## 2014-03-06 MED ORDER — PROPOFOL 10 MG/ML IV BOLUS
INTRAVENOUS | Status: AC
Start: 2014-03-06 — End: 2014-03-06
  Filled 2014-03-06: qty 20

## 2014-03-06 MED ORDER — MEPERIDINE HCL 25 MG/ML IJ SOLN
6.2500 mg | INTRAMUSCULAR | Status: DC | PRN
Start: 2014-03-06 — End: 2014-03-06

## 2014-03-06 MED ORDER — ONDANSETRON HCL 4 MG/2ML IJ SOLN
INTRAMUSCULAR | Status: DC | PRN
  Administered 2014-03-06: 4 mg via INTRAVENOUS

## 2014-03-06 MED ORDER — MIDAZOLAM HCL 5 MG/5ML IJ SOLN
INTRAMUSCULAR | Status: DC | PRN
  Administered 2014-03-06: 2 mg via INTRAVENOUS

## 2014-03-06 MED ORDER — LACTATED RINGERS IV SOLN: INTRAVENOUS | Status: DC | PRN

## 2014-03-06 MED ORDER — FENTANYL CITRATE 0.05 MG/ML IJ SOLN
INTRAMUSCULAR | Status: AC
  Filled 2014-03-06: qty 2

## 2014-03-06 MED ORDER — OXYCODONE HCL 5 MG PO TABS: 5.0000 mg | ORAL_TABLET | Freq: Four times a day (QID) | ORAL | Status: DC | PRN

## 2014-03-06 SURGICAL SUPPLY — 44 items
BENZOIN TINCTURE PRP APPL 2/3 (GAUZE/BANDAGES/DRESSINGS) ×3 IMPLANT
CANISTER SUCTION 2500CC (MISCELLANEOUS) ×3 IMPLANT
CLIP LIGATING EXTRA MED SLVR (CLIP) ×3 IMPLANT
CLIP LIGATING EXTRA SM BLUE (MISCELLANEOUS) ×3 IMPLANT
CLOSURE STERI-STRIP 1/2X4 (GAUZE/BANDAGES/DRESSINGS) ×1
CLOSURE WOUND 1/2 X4 (GAUZE/BANDAGES/DRESSINGS) ×1
CLSR STERI-STRIP ANTIMIC 1/2X4 (GAUZE/BANDAGES/DRESSINGS) ×2 IMPLANT
COVER PROBE W GEL 5X96 (DRAPES) ×3 IMPLANT
COVER SURGICAL LIGHT HANDLE (MISCELLANEOUS) ×3 IMPLANT
DECANTER SPIKE VIAL GLASS SM (MISCELLANEOUS) ×3 IMPLANT
ELECT REM PT RETURN 9FT ADLT (ELECTROSURGICAL) ×3
ELECTRODE REM PT RTRN 9FT ADLT (ELECTROSURGICAL) ×1 IMPLANT
GAUZE SPONGE 4X4 12PLY STRL (GAUZE/BANDAGES/DRESSINGS) ×3 IMPLANT
GEL ULTRASOUND 20GR AQUASONIC (MISCELLANEOUS) IMPLANT
GLOVE BIOGEL M 6.5 STRL (GLOVE) ×3 IMPLANT
GLOVE BIOGEL PI IND STRL 6.5 (GLOVE) ×2 IMPLANT
GLOVE BIOGEL PI IND STRL 7.0 (GLOVE) ×1 IMPLANT
GLOVE BIOGEL PI IND STRL 8 (GLOVE) ×1 IMPLANT
GLOVE BIOGEL PI INDICATOR 6.5 (GLOVE) ×4
GLOVE BIOGEL PI INDICATOR 7.0 (GLOVE) ×2
GLOVE BIOGEL PI INDICATOR 8 (GLOVE) ×2
GLOVE SS BIOGEL STRL SZ 7.5 (GLOVE) ×1 IMPLANT
GLOVE SUPERSENSE BIOGEL SZ 7.5 (GLOVE) ×2
GLOVE SURG SS PI 7.0 STRL IVOR (GLOVE) ×6 IMPLANT
GOWN STRL REUS W/ TWL LRG LVL3 (GOWN DISPOSABLE) ×4 IMPLANT
GOWN STRL REUS W/ TWL XL LVL3 (GOWN DISPOSABLE) ×2 IMPLANT
GOWN STRL REUS W/TWL LRG LVL3 (GOWN DISPOSABLE) ×8
GOWN STRL REUS W/TWL XL LVL3 (GOWN DISPOSABLE) ×4
KIT BASIN OR (CUSTOM PROCEDURE TRAY) ×3 IMPLANT
KIT ROOM TURNOVER OR (KITS) ×3 IMPLANT
NS IRRIG 1000ML POUR BTL (IV SOLUTION) ×3 IMPLANT
PACK CV ACCESS (CUSTOM PROCEDURE TRAY) ×3 IMPLANT
PAD ARMBOARD 7.5X6 YLW CONV (MISCELLANEOUS) ×6 IMPLANT
SPONGE GAUZE 4X4 12PLY STER LF (GAUZE/BANDAGES/DRESSINGS) ×3 IMPLANT
STRIP CLOSURE SKIN 1/2X4 (GAUZE/BANDAGES/DRESSINGS) ×2 IMPLANT
SUT PROLENE 6 0 CC (SUTURE) ×12 IMPLANT
SUT SILK 2 0 SH (SUTURE) ×6 IMPLANT
SUT VIC AB 3-0 SH 27 (SUTURE) ×2
SUT VIC AB 3-0 SH 27X BRD (SUTURE) ×1 IMPLANT
TAPE CLOTH SURG 4X10 WHT LF (GAUZE/BANDAGES/DRESSINGS) ×3 IMPLANT
TOWEL OR 17X24 6PK STRL BLUE (TOWEL DISPOSABLE) ×3 IMPLANT
TOWEL OR 17X26 10 PK STRL BLUE (TOWEL DISPOSABLE) ×3 IMPLANT
UNDERPAD 30X30 INCONTINENT (UNDERPADS AND DIAPERS) ×3 IMPLANT
WATER STERILE IRR 1000ML POUR (IV SOLUTION) ×3 IMPLANT

## 2014-03-06 NOTE — Anesthesia Postprocedure Evaluation (Signed)
  Anesthesia Post-op Note  Patient: Karen Morrow  Procedure(s) Performed: Procedure(s): SECOND STAGE BASILIC VEIN TRANSPOSITION (Right)  Patient Location: PACU  Anesthesia Type:General  Level of Consciousness: awake, alert  and oriented  Airway and Oxygen Therapy: Patient Spontanous Breathing  Post-op Pain: none  Post-op Assessment: Post-op Vital signs reviewed  Post-op Vital Signs: Reviewed and stable  Last Vitals:  Filed Vitals:   03/06/14 1130  BP: 148/87  Pulse: 90  Temp:   Resp: 15    Complications: No apparent anesthesia complications

## 2014-03-06 NOTE — Interval H&P Note (Signed)
History and Physical Interval Note:  03/06/2014 7:45 AM  Karen Morrow  has presented today for surgery, with the diagnosis of End stage renal disease   The various methods of treatment have been discussed with the patient and family. After consideration of risks, benefits and other options for treatment, the patient has consented to  Procedure(s): SECOND STAGE BASILIC VEIN TRANSPOSITION (Right) as a surgical intervention .  The patient's history has been reviewed, patient examined, no change in status, stable for surgery.  I have reviewed the patient's chart and labs.  Questions were answered to the patient's satisfaction.     Dayon Witt

## 2014-03-06 NOTE — Telephone Encounter (Addendum)
Message copied by Doristine Section on Wed Mar 06, 2014  2:49 PM ------      Message from: Michaele Offer S      Created: Wed Mar 06, 2014 11:49 AM      Regarding: 4 week f/u w/ TFE                   ----- Message -----         From: Alvia Grove, PA-C         Sent: 03/06/2014  11:00 AM           To: [facility] Charge Pool            S/p second stage BVT 03/06/14            F/u with Dr. Donnetta Hutching in 4 weeks. No duplex.            Thanks,      Kim ------  notified patient of post op appt. on 04-09-14 at 8:45 with dr. early

## 2014-03-06 NOTE — H&P (View-Only) (Signed)
Patient name: Karen Morrow MRN: [id number] DOB: 1961/02/24 Sex: female     Chief Complaint  Patient presents with  . Re-evaluation    4 wk f/u     HISTORY OF PRESENT ILLNESS:  patient comes back today for followup.  On 12/03/2013, Dr. early performed a first stage right basilic vein transposition.  The patient states she will occasionally get numbness in her right hand but there exercises gets better.  She currently has a right-sided catheter for dialysis.  Past Medical History  Diagnosis Date  . Asthma   . Hepatitis C antibody test positive   . Hypertension     "just dx'd today" (11/03/2012)  . Anginal pain     Past Surgical History  Procedure Laterality Date  . Abdominal hysterectomy      "partial" (11/03/2012)  . Esophagogastroduodenoscopy N/A 06/05/2013    Procedure: ESOPHAGOGASTRODUODENOSCOPY (EGD);  Surgeon: Winfield Cunas., MD;  Location: [facility];  Service: Endoscopy;  Laterality: N/A;  . Av fistula placement Right 12/03/2013    Procedure: CREATION OF ARTERIOVENOUS (AV) FISTULA RIGHT ARM ;  Surgeon: Rosetta Posner, MD;  Location: [facility];  Service: Vascular;  Laterality: Right;    History   Social History  . Marital Status: Married    Spouse Name: N/A    Number of Children: N/A  . Years of Education: N/A   Occupational History  . Not on file.   Social History Main Topics  . Smoking status: Current Every Day Smoker -- 0.50 packs/day for 20 years    Types: Cigarettes  . Smokeless tobacco: Never Used  . Alcohol Use: 27.6 oz/week    46 Cans of beer per week     Comment: 11/03/2012 "1-2 40oz beers/day maybe"  . Drug Use: Yes    Special: Marijuana     Comment: 11/03/2012 "maybe once or twice/wk"  . Sexual Activity: Not Currently   Other Topics Concern  . Not on file   Social History Narrative  . No narrative on file    No family history on file.  Allergies as of 02/25/2014 - Review Complete 02/25/2014  Allergen Reaction Noted  . Cefazolin   12/05/2013    Current Outpatient Prescriptions on File Prior to Visit  Medication Sig Dispense Refill  . hydrALAZINE (APRESOLINE) 25 MG tablet Take 1 tablet (25 mg total) by mouth 3 (three) times daily.  90 tablet  5  . sevelamer carbonate (RENVELA) 800 MG tablet Take 3 tablets (2,400 mg total) by mouth 3 (three) times daily with meals.      Marland Kitchen amLODipine (NORVASC) 10 MG tablet Take 1 tablet (10 mg total) by mouth daily.  30 tablet  0  . metoprolol tartrate (LOPRESSOR) 25 MG tablet Take 1 tablet (25 mg total) by mouth 2 (two) times daily.  60 tablet  0  . predniSONE (DELTASONE) 20 MG tablet Take 1 tablet (20 mg total) by mouth daily with breakfast. Take 2 pills daily, then see Dr. Beryle Beams and decide about future dose.  30 tablet  0  . zolpidem (AMBIEN) 10 MG tablet Take 1 tablet (10 mg total) by mouth at bedtime as needed for sleep.  30 tablet  3  . zolpidem (AMBIEN) 10 MG tablet Take 1 tablet (10 mg total) by mouth at bedtime as needed for sleep.  20 tablet  0   No current facility-administered medications on file prior to visit.     REVIEW OF SYSTEMS: Cardiovascular: No chest pain,  chest pressure, palpitations, orthopnea, or dyspnea on exertion.  Integumentary: No rashes or ulcers. Constitutional: No fever or chills.  PHYSICAL EXAMINATION:   Vital signs are BP 204/125  Pulse 80  Ht 5\' 7"  (1.702 m)  Wt 135 lb (61.236 kg)  BMI 21.14 kg/m2  SpO2 100% General: The patient appears their stated age. HEENT:  No gross abnormalities Pulmonary:  Non labored breathing Musculoskeletal: There are no major deformities. Neurologic: No focal weakness or paresthesias are detected, Skin: There are no ulcer or rashes noted. Psychiatric: The patient has normal affect. Cardiovascular: There is a regular rate and rhythm without significant murmur appreciated.   Diagnostic Studies Ultrasound was reviewed today.  This shows a basilic vein with excellent diameters, all greater than 0.55 cm.   There are multiple branches  Assessment: End-stage renal disease Plan: The patient has completed a successful first stage basilic vein transposition.  I discussed proceeding with the second stage of the procedure for elevation of the fistula.  This will be scheduled for Dr. early on nondialysis day.  Eldridge Abrahams, M.D. [facility] Office: [phone] Pager:  [phone]

## 2014-03-06 NOTE — Transfer of Care (Signed)
Immediate Anesthesia Transfer of Care Note  Patient: Karen Morrow  Procedure(s) Performed: Procedure(s): SECOND STAGE BASILIC VEIN TRANSPOSITION (Right)  Patient Location: PACU  Anesthesia Type:MAC  Level of Consciousness: awake, alert  and oriented  Airway & Oxygen Therapy: Patient Spontanous Breathing  Post-op Assessment: Report given to PACU RN  Post vital signs: Reviewed and stable  Complications: No apparent anesthesia complications

## 2014-03-06 NOTE — Op Note (Signed)
    OPERATIVE REPORT  DATE OF SURGERY: 03/06/2014  PATIENT: Karen Morrow, 53 y.o. female MRN: [id number]  DOB: 1960-10-11  PRE-OPERATIVE DIAGNOSIS: End stage renal disease  POST-OPERATIVE DIAGNOSIS:  Same  PROCEDURE: Second stage basilic vein transposition right upper arm  SURGEON:  Curt Jews, M.D.  PHYSICIAN ASSISTANT: Trinh  ANESTHESIA:  Local with sedation  EBL: 100 ml  Total I/O In: 400 [I.V.:400] Out: 100 [Blood:100]  BLOOD ADMINISTERED: None  DRAINS: None  SPECIMEN: None  COUNTS CORRECT:  YES  PLAN OF CARE: PACU   PATIENT DISPOSITION:  PACU - hemodynamically stable  PROCEDURE DETAILS: Patient was taken to the operating placed supine position the area of the right arm was prepped and draped in usual sterile fashion. Incision was made over the prior antecubital scar and carried down to isolate the radial artery anastomosis. Brachial artery was occluded proximally and distally and a basilic vein was taken down. The brachial artery was temporary closed with a running 5-0 Prolene suture. The basilic vein was mobilized using the small incisions in the upper arm. Curvature branches were ligated with 3 or 4-0 silk ties and divided. The vein was mobilized somewhat and the vein was marked to prevent twisting. The vein was brought out through the incision at the axilla. The vein was gently dilated with heparinized saline was of excellent caliber. A tunnel was capable biceps up to the axillary incision. The vein was brought through this tunnel taking care not to twist the vein. The vein was gently dilated again and laid very nicely in the tunnel. The brachial artery was reoccluded proximally and distally in the arteriotomy was opened by removing the 6-0 Prolene suture. The vein was cut to appropriate length and was sewn end-to-side to the artery with a running 6-0 Prolene suture. Clamps were removed and excellent thrill was noted. The wound irrigated with saline. Hemostasis was  obtained with electrocautery. The wounds were closed with 3-0 Vicryl the subcutaneous and subcuticular tissue. Benzoin and Steri-Strips were applied. The patient transferred to the recovery was stable condition   Curt Jews, M.D. 03/06/2014 11:14 AM

## 2014-03-07 ENCOUNTER — Encounter (HOSPITAL_COMMUNITY): Payer: Self-pay | Admitting: Vascular Surgery

## 2014-03-09 DIAGNOSIS — Z23 Encounter for immunization: Secondary | ICD-10-CM | POA: Diagnosis not present

## 2014-03-09 DIAGNOSIS — N2581 Secondary hyperparathyroidism of renal origin: Secondary | ICD-10-CM | POA: Diagnosis not present

## 2014-03-09 DIAGNOSIS — D509 Iron deficiency anemia, unspecified: Secondary | ICD-10-CM | POA: Diagnosis not present

## 2014-03-09 DIAGNOSIS — N186 End stage renal disease: Secondary | ICD-10-CM | POA: Diagnosis not present

## 2014-03-09 DIAGNOSIS — D631 Anemia in chronic kidney disease: Secondary | ICD-10-CM | POA: Diagnosis not present

## 2014-03-12 ENCOUNTER — Telehealth: Payer: Self-pay

## 2014-03-12 DIAGNOSIS — Z23 Encounter for immunization: Secondary | ICD-10-CM | POA: Diagnosis not present

## 2014-03-12 DIAGNOSIS — D509 Iron deficiency anemia, unspecified: Secondary | ICD-10-CM | POA: Diagnosis not present

## 2014-03-12 DIAGNOSIS — N186 End stage renal disease: Secondary | ICD-10-CM | POA: Diagnosis not present

## 2014-03-12 DIAGNOSIS — D631 Anemia in chronic kidney disease: Secondary | ICD-10-CM | POA: Diagnosis not present

## 2014-03-12 DIAGNOSIS — N2581 Secondary hyperparathyroidism of renal origin: Secondary | ICD-10-CM | POA: Diagnosis not present

## 2014-03-12 NOTE — Telephone Encounter (Signed)
Phone call from pt.  Questioned about when she will have an appt. To get her dressing changed on her right arm.  Stated no one at the hospital told her anything about taking care of the incision.  Advised pt. that after the 1st 48 hrs. following surgery, the recommendation is to cleanse the incisional area with dial soap and rinse well; cover with clean dry gauze bandage, change as needed.  Pt. stated "I don't feel comfortable changing the bandage for the 1st time; I would feel better if a doctor would look at it and tell me if it looks alright; I don't know what it is supposed to look like."   Appt. given for incision check 10/14 @ 9:00 AM.   Agrees with plan.

## 2014-03-13 ENCOUNTER — Ambulatory Visit (INDEPENDENT_AMBULATORY_CARE_PROVIDER_SITE_OTHER): Payer: Self-pay | Admitting: Family

## 2014-03-13 ENCOUNTER — Encounter: Payer: Self-pay | Admitting: Family

## 2014-03-13 VITALS — BP 172/106 | HR 82 | Temp 97.1°F | Resp 16 | Ht 67.0 in | Wt 131.0 lb

## 2014-03-13 DIAGNOSIS — Z5189 Encounter for other specified aftercare: Secondary | ICD-10-CM

## 2014-03-13 DIAGNOSIS — M79601 Pain in right arm: Secondary | ICD-10-CM

## 2014-03-13 DIAGNOSIS — N186 End stage renal disease: Secondary | ICD-10-CM

## 2014-03-13 MED ORDER — OXYCODONE HCL 5 MG PO TABS: 5.0000 mg | ORAL_TABLET | Freq: Four times a day (QID) | ORAL | Status: DC | PRN

## 2014-03-13 NOTE — Progress Notes (Signed)
Post op Dialysis Access  History of Present Illness  Karen Morrow is a 53 y.o. (07-31-1960) female patient of Dr. Donnetta Hutching who is S/P 2nd stage BVT 03-06-14 by Dr. Donnetta Hutching. She comes in today for dressing change to right upper arm, she also c/o swelling, and pain in right upper arm. She used her last analgesic tablet today and is requesting a refill. Patient denies fever or chills, denies tingling, numbness, or pain in right hand. She dialyzes T-TH-Sat using a temporary catheter in the right side of her chest.   Past Medical History  Diagnosis Date  . Asthma   . Hepatitis C antibody test positive   . Hypertension     "just dx'd today" (11/03/2012)  . Anginal pain   . Chronic kidney disease     dialysis 3x wk    Social History History  Substance Use Topics  . Smoking status: Light Tobacco Smoker -- 0.50 packs/day for 20 years    Types: Cigarettes  . Smokeless tobacco: Never Used  . Alcohol Use: 27.6 oz/week    46 Cans of beer per week     Comment: 11/03/2012 "1-2 40oz beers/day maybe"    Family History Family History  Problem Relation Age of Onset  . Lupus Mother   . Diabetes Father   . Heart attack Father     Surgical History Past Surgical History  Procedure Laterality Date  . Abdominal hysterectomy      "partial" (11/03/2012)  . Esophagogastroduodenoscopy N/A 06/05/2013    Procedure: ESOPHAGOGASTRODUODENOSCOPY (EGD);  Surgeon: Winfield Cunas., MD;  Location: [facility];  Service: Endoscopy;  Laterality: N/A;  . Av fistula placement Right 12/03/2013    Procedure: CREATION OF ARTERIOVENOUS (AV) FISTULA RIGHT ARM ;  Surgeon: Rosetta Posner, MD;  Location: [facility];  Service: Vascular;  Laterality: Right;  . Bascilic vein transposition Right 03/06/2014    Procedure: SECOND STAGE BASILIC VEIN TRANSPOSITION;  Surgeon: Rosetta Posner, MD;  Location: [place];  Service: Vascular;  Laterality: Right;    Allergies  Allergen Reactions  . Cefazolin     Severe thrombocytopenia     Current Outpatient Prescriptions  Medication Sig Dispense Refill  . amLODipine (NORVASC) 10 MG tablet Take 1 tablet (10 mg total) by mouth daily.  30 tablet  0  . hydrALAZINE (APRESOLINE) 25 MG tablet Take 1 tablet (25 mg total) by mouth 3 (three) times daily.  90 tablet  5  . metoprolol tartrate (LOPRESSOR) 25 MG tablet Take 1 tablet (25 mg total) by mouth 2 (two) times daily.  60 tablet  0  . predniSONE (DELTASONE) 20 MG tablet Take 1 tablet (20 mg total) by mouth daily with breakfast. Take 2 pills daily, then see Dr. Beryle Beams and decide about future dose.  30 tablet  0  . sevelamer carbonate (RENVELA) 800 MG tablet Take 3 tablets (2,400 mg total) by mouth 3 (three) times daily with meals.      Marland Kitchen oxyCODONE (ROXICODONE) 5 MG immediate release tablet Take 1 tablet (5 mg total) by mouth every 6 (six) hours as needed for severe pain.  30 tablet  0  . zolpidem (AMBIEN) 10 MG tablet Take 1 tablet (10 mg total) by mouth at bedtime as needed for sleep.  20 tablet  0   No current facility-administered medications for this visit.     REVIEW OF SYSTEMS: see HPI for pertinent positives and negatives    PHYSICAL EXAMINATION:  Filed Vitals:   03/13/14  0915  BP: 172/106  Pulse: 82  Temp: 97.1 F (36.2 C)  TempSrc: Oral  Resp: 16  Height: 5\' 7"  (1.702 m)  Weight: 131 lb (59.421 kg)  SpO2: 100%   Body mass index is 20.51 kg/(m^2).  General: The patient appears their stated age.   HEENT:  No gross abnormalities Pulmonary: Respirations are non-labored Musculoskeletal: There are no major deformities. Mild/moderate amount of expected swelling and resolving bruises in right upper arm.  Neurologic: No focal weakness or paresthesias are detected. Skin: There are no ulcer or rashes noted. Psychiatric: The patient has normal affect. Cardiovascular: There is a regular rate and rhythm. Palpable thrill over right BVT. Bilateral radial pulses are 2+ palpable.    Medical Decision  Making  Karen Morrow is a 53 y.o. (10-30-1960) female patient of Dr. Donnetta Hutching who is S/P 2nd stage BVT 03-06-14 by Dr. Donnetta Hutching. She comes in today for dressing change to right upper arm, she also c/o swelling, and pain in right upper arm. She used her last analgesic tablet today and is requesting a refill. Patient denies fever or chills, denies tingling, numbness, or pain in right hand. She dialyzes T-TH-Sat using a temporary catheter in the right side of her chest.   Right axillary and medial aspect upper right arm incisions healing well without erythema, with scant bloody drainage from incisions, with resolving bruising, with minimal swelling, is afebrile. Keep right upper arm surgical incisions clean and dry, may protect incisions from clothes and friction with light dry gauze daily dressing changes. Oxycodone 5 mg, 1 tablet po every 6 hours prn pain reordered, disp #20, 0 refills. Return as scheduled April 09, 2014, Dr. Donnetta Hutching.   [name], [name], RN, MSN, FNP-C [facility] Office: [phone]  03/13/2014, 9:15 AM  Clinic [id number]

## 2014-03-13 NOTE — Patient Instructions (Addendum)
Vascular Access for Hemodialysis A vascular access is a connection between two blood vessels that allows blood to be easily removed from the body and returned to the body during hemodialysis. Hemodialysis is a procedure in which a machine outside of the body filters the blood. There are three types of vascular accesses:   Arteriovenous fistula. This is a connection between an artery and a vein (usually in the arm) that is made by sewing them together. Blood in the artery flows directly into the vein, causing it to get larger over time. This makes it easier for the vein to be used for hemodialysis. An arteriovenous fistula takes 1-6 months to develop after surgery.   Arteriovenous graft. This is a connection between an artery and a vein in the arm that is made with a tube. An arteriovenous graft can be used within 2-3 weeks of surgery.   Venous catheter. This is a thin, flexible tube that is placed in a large vein (usually in the neck, chest, or groin). A venous catheter for hemodialysis contains two tubes that come out of the skin. A venous catheter can be used right away. It is usually used as a temporary access if you need hemodialysis before a fistula or graft has developed. It may also be used as a permanent access if a fistula or graft cannot be created. WHICH TYPE OF ACCESS IS BEST FOR ME? The type of access that is best for you depends on the size and strength of your veins.  A fistula is usually the preferred type of access. It can last several years and is less likely than the other types of accesses to become infected or to cause blood clots within a blood vessel (thrombosis). However, a fistula is not an option for everyone. If your veins are not the right size, a graft may be used instead. Grafts require you to have strong veins. If your veins are not strong enough for a graft, a catheter may be used. Catheters are more likely than fistulas and grafts to become infected or to have thrombosis.   Sometimes, only one type of access is an option. Your health care provider will help you determine which type of access is best for you.  HOW IS A VASCULAR ACCESS USED? The way the access is used depends on the type of access:   If the access is a fistula or graft, two needles are inserted through the skin into the access before each hemodialysis session. Blood leaves the body through one of the needles and travels through a tube to the hemodialysis machine (dialyzer). It then flows through another tube and returns to the body through the second needle.   If the access is a catheter, one tube is connected directly to the tube that leads to the dialyzer and the other is connected to a tube that leads away from the dialyzer. Blood leaves the body through one tube and returns to the body through the other.  WHAT KIND OF PROBLEMS CAN OCCUR WITH VASCULAR ACCESSES?  Blood clots within a blood vessel (thrombosis). Thrombosis can lead to a narrowing of a blood vessel or tube (stenosis). If thrombosis occurs frequently, another access site may be created as a backup.   Infection.  These problems are most likely to occur with a venous catheter and least likely to occur with an arteriovenous fistula.  HOW DO I CARE FOR MY VASCULAR ACCESS? Wear a medical alert bracelet. This tells health care providers that you are   a dialysis patient in the case of an emergency and allows them to care for your veins appropriately. If you have a graft or fistula:   A "bruit" is a noise that is heard with a stethoscope and a "thrill" is a vibration felt over the graft or fistula. The presence of the bruit and thrill indicates that the access is working. You will be taught to feel for the thrill each day. If this is not felt, the access may be clotted. Call your health care provider.   You may use the arm where your vascular access is located freely after the site heals. Keep the following in mind:   Avoid pressure on  the arm.   Avoid lifting heavy objects with the arm.   Avoid sleeping on the arm.   Avoid wearing tight-sleeved shirts or jewelry around the graft or fistula.   Do not allow blood pressure monitoring or needle punctures on the side where the graft or fistula is located.   With permission from your health care provider, you may do exercises to help with blood flow through a fistula. These exercises involve squeezing a rubber ball or other soft objects as instructed. SEEK MEDICAL CARE IF:   Chills develop.   You have an oral temperature above 102 F (38.9 C).  Swelling around the graft or fistula gets worse.   New pain develops.   Pus or other fluid (drainage) is seen at the vascular access site.   Skin redness or red streaking is seen on the skin around, above, or below the vascular access. SEEK IMMEDIATE MEDICAL CARE IF:   Pain, numbness, or an unusual pale skin color develops in the hand on the side of your fistula.   Dizziness or weakness develops that you have not had before.   The vascular access has bleeding that cannot be easily controlled. Document Released: 08/07/2002 Document Revised: 10/01/2013 Document Reviewed: 10/03/2012 [facility] Patient Information 2015 [place], [place]. This information is not intended to replace advice given to you by your health care provider. Make sure you discuss any questions you have with your health care provider.   Smoking Cessation Quitting smoking is important to your health and has many advantages. However, it is not always easy to quit since nicotine is a very addictive drug. Oftentimes, people try 3 times or more before being able to quit. This document explains the best ways for you to prepare to quit smoking. Quitting takes hard work and a lot of effort, but you can do it. ADVANTAGES OF QUITTING SMOKING  You will live longer, feel better, and live better.  Your body will feel the impact of quitting smoking almost  immediately.  Within 20 minutes, blood pressure decreases. Your pulse returns to its normal level.  After 8 hours, carbon monoxide levels in the blood return to normal. Your oxygen level increases.  After 24 hours, the chance of having a heart attack starts to decrease. Your breath, hair, and body stop smelling like smoke.  After 48 hours, damaged nerve endings begin to recover. Your sense of taste and smell improve.  After 72 hours, the body is virtually free of nicotine. Your bronchial tubes relax and breathing becomes easier.  After 2 to 12 weeks, lungs can hold more air. Exercise becomes easier and circulation improves.  The risk of having a heart attack, stroke, cancer, or lung disease is greatly reduced.  After 1 year, the risk of coronary heart disease is cut in half.  After 5  years, the risk of stroke falls to the same as a nonsmoker.  After 10 years, the risk of lung cancer is cut in half and the risk of other cancers decreases significantly.  After 15 years, the risk of coronary heart disease drops, usually to the level of a nonsmoker.  If you are pregnant, quitting smoking will improve your chances of having a healthy baby.  The people you live with, especially any children, will be healthier.  You will have extra money to spend on things other than cigarettes. QUESTIONS TO THINK ABOUT BEFORE ATTEMPTING TO QUIT You may want to talk about your answers with your health care provider.  Why do you want to quit?  If you tried to quit in the past, what helped and what did not?  What will be the most difficult situations for you after you quit? How will you plan to handle them?  Who can help you through the tough times? Your family? Friends? A health care provider?  What pleasures do you get from smoking? What ways can you still get pleasure if you quit? Here are some questions to ask your health care provider:  How can you help me to be successful at quitting?  What  medicine do you think would be best for me and how should I take it?  What should I do if I need more help?  What is smoking withdrawal like? How can I get information on withdrawal? GET READY  Set a quit date.  Change your environment by getting rid of all cigarettes, ashtrays, matches, and lighters in your home, car, or work. Do not let people smoke in your home.  Review your past attempts to quit. Think about what worked and what did not. GET SUPPORT AND ENCOURAGEMENT You have a better chance of being successful if you have help. You can get support in many ways.  Tell your family, friends, and coworkers that you are going to quit and need their support. Ask them not to smoke around you.  Get individual, group, or telephone counseling and support. Programs are available at [employer] and health centers. Call your local health department for information about programs in your area.  Spiritual beliefs and practices may help some smokers quit.  Download a "quit meter" on your computer to keep track of quit statistics, such as how long you have gone without smoking, cigarettes not smoked, and money saved.  Get a self-help book about quitting smoking and staying off tobacco. [place] yourself from urges to smoke. Talk to someone, go for a walk, or occupy your time with a task.  Change your normal routine. Take a different route to work. Drink tea instead of coffee. Eat breakfast in a different place.  Reduce your stress. Take a hot bath, exercise, or read a book.  Plan something enjoyable to do every day. Reward yourself for not smoking.  Explore interactive web-based programs that specialize in helping you quit. GET MEDICINE AND USE IT CORRECTLY Medicines can help you stop smoking and decrease the urge to smoke. Combining medicine with the above behavioral methods and support can greatly increase your chances of successfully quitting  smoking.  Nicotine replacement therapy helps deliver nicotine to your body without the negative effects and risks of smoking. Nicotine replacement therapy includes nicotine gum, lozenges, inhalers, nasal sprays, and skin patches. Some may be available over-the-counter and others require a prescription.  Antidepressant medicine helps people abstain from smoking,  but how this works is unknown. This medicine is available by prescription.  Nicotinic receptor partial agonist medicine simulates the effect of nicotine in your brain. This medicine is available by prescription. Ask your health care provider for advice about which medicines to use and how to use them based on your health history. Your health care provider will tell you what side effects to look out for if you choose to be on a medicine or therapy. Carefully read the information on the package. Do not use any other product containing nicotine while using a nicotine replacement product.  RELAPSE OR DIFFICULT SITUATIONS Most relapses occur within the first 3 months after quitting. Do not be discouraged if you start smoking again. Remember, most people try several times before finally quitting. You may have symptoms of withdrawal because your body is used to nicotine. You may crave cigarettes, be irritable, feel very hungry, cough often, get headaches, or have difficulty concentrating. The withdrawal symptoms are only temporary. They are strongest when you first quit, but they will go away within 10-14 days. To reduce the chances of relapse, try to:  Avoid drinking alcohol. Drinking lowers your chances of successfully quitting.  Reduce the amount of caffeine you consume. Once you quit smoking, the amount of caffeine in your body increases and can give you symptoms, such as a rapid heartbeat, sweating, and anxiety.  Avoid smokers because they can make you want to smoke.  Do not let weight gain distract you. Many smokers will gain weight when they  quit, usually less than 10 pounds. Eat a healthy diet and stay active. You can always lose the weight gained after you quit.  Find ways to improve your mood other than smoking. FOR MORE INFORMATION  www.smokefree.gov  Document Released: 05/11/2001 Document Revised: 10/01/2013 Document Reviewed: 08/26/2011 [facility] Patient Information 2015 [place], [place]. This information is not intended to replace advice given to you by your health care provider. Make sure you discuss any questions you have with your health care provider.

## 2014-03-14 DIAGNOSIS — D509 Iron deficiency anemia, unspecified: Secondary | ICD-10-CM | POA: Diagnosis not present

## 2014-03-14 DIAGNOSIS — Z23 Encounter for immunization: Secondary | ICD-10-CM | POA: Diagnosis not present

## 2014-03-14 DIAGNOSIS — N2581 Secondary hyperparathyroidism of renal origin: Secondary | ICD-10-CM | POA: Diagnosis not present

## 2014-03-14 DIAGNOSIS — D631 Anemia in chronic kidney disease: Secondary | ICD-10-CM | POA: Diagnosis not present

## 2014-03-14 DIAGNOSIS — N186 End stage renal disease: Secondary | ICD-10-CM | POA: Diagnosis not present

## 2014-03-16 DIAGNOSIS — Z23 Encounter for immunization: Secondary | ICD-10-CM | POA: Diagnosis not present

## 2014-03-16 DIAGNOSIS — D509 Iron deficiency anemia, unspecified: Secondary | ICD-10-CM | POA: Diagnosis not present

## 2014-03-16 DIAGNOSIS — N186 End stage renal disease: Secondary | ICD-10-CM | POA: Diagnosis not present

## 2014-03-16 DIAGNOSIS — D631 Anemia in chronic kidney disease: Secondary | ICD-10-CM | POA: Diagnosis not present

## 2014-03-16 DIAGNOSIS — N2581 Secondary hyperparathyroidism of renal origin: Secondary | ICD-10-CM | POA: Diagnosis not present

## 2014-03-19 DIAGNOSIS — D631 Anemia in chronic kidney disease: Secondary | ICD-10-CM | POA: Diagnosis not present

## 2014-03-19 DIAGNOSIS — Z23 Encounter for immunization: Secondary | ICD-10-CM | POA: Diagnosis not present

## 2014-03-19 DIAGNOSIS — N186 End stage renal disease: Secondary | ICD-10-CM | POA: Diagnosis not present

## 2014-03-19 DIAGNOSIS — D509 Iron deficiency anemia, unspecified: Secondary | ICD-10-CM | POA: Diagnosis not present

## 2014-03-19 DIAGNOSIS — N2581 Secondary hyperparathyroidism of renal origin: Secondary | ICD-10-CM | POA: Diagnosis not present

## 2014-03-23 DIAGNOSIS — N186 End stage renal disease: Secondary | ICD-10-CM | POA: Diagnosis not present

## 2014-03-23 DIAGNOSIS — N2581 Secondary hyperparathyroidism of renal origin: Secondary | ICD-10-CM | POA: Diagnosis not present

## 2014-03-23 DIAGNOSIS — D631 Anemia in chronic kidney disease: Secondary | ICD-10-CM | POA: Diagnosis not present

## 2014-03-23 DIAGNOSIS — Z23 Encounter for immunization: Secondary | ICD-10-CM | POA: Diagnosis not present

## 2014-03-23 DIAGNOSIS — D509 Iron deficiency anemia, unspecified: Secondary | ICD-10-CM | POA: Diagnosis not present

## 2014-03-25 ENCOUNTER — Telehealth: Payer: Self-pay

## 2014-03-25 NOTE — Telephone Encounter (Signed)
PC from pt.  Requested refill on pain medication.  Stated most of the pain is at night when she tries to sleep.  Reported there is a small amt. Of swelling in the right arm; stated it is getting better.  Reported incision is intact; steri strips are curling up and falling off.  Denied any incisional drainage.  Denied fever.  Advised to wash incision with dial soap/ water and continue to keep clean/ dry.  Encouraged to take Tylenol ES for pain, since she is nearly 3 wks. post-op.  Advised to follow the package directions re: the 24 hr. dosing limit with Tylenol.  Verb. understanding.

## 2014-03-26 DIAGNOSIS — D631 Anemia in chronic kidney disease: Secondary | ICD-10-CM | POA: Diagnosis not present

## 2014-03-26 DIAGNOSIS — D509 Iron deficiency anemia, unspecified: Secondary | ICD-10-CM | POA: Diagnosis not present

## 2014-03-26 DIAGNOSIS — N2581 Secondary hyperparathyroidism of renal origin: Secondary | ICD-10-CM | POA: Diagnosis not present

## 2014-03-26 DIAGNOSIS — Z23 Encounter for immunization: Secondary | ICD-10-CM | POA: Diagnosis not present

## 2014-03-26 DIAGNOSIS — N186 End stage renal disease: Secondary | ICD-10-CM | POA: Diagnosis not present

## 2014-03-27 ENCOUNTER — Ambulatory Visit: Payer: Medicare Other | Admitting: Family Medicine

## 2014-03-28 DIAGNOSIS — N186 End stage renal disease: Secondary | ICD-10-CM | POA: Diagnosis not present

## 2014-03-28 DIAGNOSIS — D631 Anemia in chronic kidney disease: Secondary | ICD-10-CM | POA: Diagnosis not present

## 2014-03-28 DIAGNOSIS — N2581 Secondary hyperparathyroidism of renal origin: Secondary | ICD-10-CM | POA: Diagnosis not present

## 2014-03-28 DIAGNOSIS — D509 Iron deficiency anemia, unspecified: Secondary | ICD-10-CM | POA: Diagnosis not present

## 2014-03-28 DIAGNOSIS — Z23 Encounter for immunization: Secondary | ICD-10-CM | POA: Diagnosis not present

## 2014-03-30 DIAGNOSIS — Z992 Dependence on renal dialysis: Secondary | ICD-10-CM | POA: Diagnosis not present

## 2014-03-30 DIAGNOSIS — N186 End stage renal disease: Secondary | ICD-10-CM | POA: Diagnosis not present

## 2014-04-02 DIAGNOSIS — N2581 Secondary hyperparathyroidism of renal origin: Secondary | ICD-10-CM | POA: Diagnosis not present

## 2014-04-02 DIAGNOSIS — D509 Iron deficiency anemia, unspecified: Secondary | ICD-10-CM | POA: Diagnosis not present

## 2014-04-02 DIAGNOSIS — T1490 Injury, unspecified: Secondary | ICD-10-CM | POA: Diagnosis not present

## 2014-04-02 DIAGNOSIS — D631 Anemia in chronic kidney disease: Secondary | ICD-10-CM | POA: Diagnosis not present

## 2014-04-02 DIAGNOSIS — N186 End stage renal disease: Secondary | ICD-10-CM | POA: Diagnosis not present

## 2014-04-02 DIAGNOSIS — Z23 Encounter for immunization: Secondary | ICD-10-CM | POA: Diagnosis not present

## 2014-04-08 ENCOUNTER — Encounter: Payer: Self-pay | Admitting: Vascular Surgery

## 2014-04-09 ENCOUNTER — Ambulatory Visit (INDEPENDENT_AMBULATORY_CARE_PROVIDER_SITE_OTHER): Payer: Medicare Other | Admitting: Vascular Surgery

## 2014-04-09 ENCOUNTER — Encounter: Payer: Self-pay | Admitting: Vascular Surgery

## 2014-04-09 VITALS — BP 166/102 | HR 90 | Resp 14 | Ht 67.0 in | Wt 135.0 lb

## 2014-04-09 DIAGNOSIS — N186 End stage renal disease: Secondary | ICD-10-CM

## 2014-04-09 NOTE — Progress Notes (Signed)
Here today for follow-up of her silicone vein transposition on 03/06/2014. This was the second stage. This is in her right arm. She reports no discomfort associated with this. She does report some tenderness around her right IJ hemodialysis catheter  On physical exam her catheter site looks quite good with no erythema around the tunnel or drainage. Her right upper arm basilic vein transposition looks quite good. She has very good size maturation. The pain is very superficial and very straight in its course.  Impression and plan a very good Karen Morrow result one month out from right upper arm basilic vein transposition. I feel that she needs to wait at least 1 more month prior to December accessed to allow for ongoing maturation. I discussed this with the patient understands. I would wait until at least December 7 and then should be able to use her right upper arm basilic vein transposition fistula

## 2014-04-15 ENCOUNTER — Other Ambulatory Visit: Payer: Self-pay | Admitting: Internal Medicine

## 2014-04-29 DIAGNOSIS — N186 End stage renal disease: Secondary | ICD-10-CM | POA: Diagnosis not present

## 2014-04-29 DIAGNOSIS — Z992 Dependence on renal dialysis: Secondary | ICD-10-CM | POA: Diagnosis not present

## 2014-04-30 DIAGNOSIS — Z23 Encounter for immunization: Secondary | ICD-10-CM | POA: Diagnosis not present

## 2014-04-30 DIAGNOSIS — D631 Anemia in chronic kidney disease: Secondary | ICD-10-CM | POA: Diagnosis not present

## 2014-04-30 DIAGNOSIS — R509 Fever, unspecified: Secondary | ICD-10-CM | POA: Diagnosis not present

## 2014-04-30 DIAGNOSIS — D509 Iron deficiency anemia, unspecified: Secondary | ICD-10-CM | POA: Diagnosis not present

## 2014-04-30 DIAGNOSIS — N2581 Secondary hyperparathyroidism of renal origin: Secondary | ICD-10-CM | POA: Diagnosis not present

## 2014-04-30 DIAGNOSIS — N186 End stage renal disease: Secondary | ICD-10-CM | POA: Diagnosis not present

## 2014-05-22 DIAGNOSIS — Z452 Encounter for adjustment and management of vascular access device: Secondary | ICD-10-CM | POA: Diagnosis not present

## 2014-05-22 DIAGNOSIS — Z992 Dependence on renal dialysis: Secondary | ICD-10-CM | POA: Diagnosis not present

## 2014-05-22 DIAGNOSIS — N186 End stage renal disease: Secondary | ICD-10-CM | POA: Diagnosis not present

## 2014-05-30 DIAGNOSIS — N186 End stage renal disease: Secondary | ICD-10-CM | POA: Diagnosis not present

## 2014-05-30 DIAGNOSIS — Z992 Dependence on renal dialysis: Secondary | ICD-10-CM | POA: Diagnosis not present

## 2014-06-02 DIAGNOSIS — D631 Anemia in chronic kidney disease: Secondary | ICD-10-CM | POA: Diagnosis not present

## 2014-06-02 DIAGNOSIS — N186 End stage renal disease: Secondary | ICD-10-CM | POA: Diagnosis not present

## 2014-06-02 DIAGNOSIS — D509 Iron deficiency anemia, unspecified: Secondary | ICD-10-CM | POA: Diagnosis not present

## 2014-06-04 DIAGNOSIS — D509 Iron deficiency anemia, unspecified: Secondary | ICD-10-CM | POA: Diagnosis not present

## 2014-06-04 DIAGNOSIS — N186 End stage renal disease: Secondary | ICD-10-CM | POA: Diagnosis not present

## 2014-06-04 DIAGNOSIS — D631 Anemia in chronic kidney disease: Secondary | ICD-10-CM | POA: Diagnosis not present

## 2014-06-08 DIAGNOSIS — D509 Iron deficiency anemia, unspecified: Secondary | ICD-10-CM | POA: Diagnosis not present

## 2014-06-08 DIAGNOSIS — D631 Anemia in chronic kidney disease: Secondary | ICD-10-CM | POA: Diagnosis not present

## 2014-06-08 DIAGNOSIS — N186 End stage renal disease: Secondary | ICD-10-CM | POA: Diagnosis not present

## 2014-06-11 DIAGNOSIS — D631 Anemia in chronic kidney disease: Secondary | ICD-10-CM | POA: Diagnosis not present

## 2014-06-11 DIAGNOSIS — D509 Iron deficiency anemia, unspecified: Secondary | ICD-10-CM | POA: Diagnosis not present

## 2014-06-11 DIAGNOSIS — N186 End stage renal disease: Secondary | ICD-10-CM | POA: Diagnosis not present

## 2014-06-13 DIAGNOSIS — D631 Anemia in chronic kidney disease: Secondary | ICD-10-CM | POA: Diagnosis not present

## 2014-06-13 DIAGNOSIS — D509 Iron deficiency anemia, unspecified: Secondary | ICD-10-CM | POA: Diagnosis not present

## 2014-06-13 DIAGNOSIS — N186 End stage renal disease: Secondary | ICD-10-CM | POA: Diagnosis not present

## 2014-06-15 DIAGNOSIS — N186 End stage renal disease: Secondary | ICD-10-CM | POA: Diagnosis not present

## 2014-06-15 DIAGNOSIS — D631 Anemia in chronic kidney disease: Secondary | ICD-10-CM | POA: Diagnosis not present

## 2014-06-15 DIAGNOSIS — D509 Iron deficiency anemia, unspecified: Secondary | ICD-10-CM | POA: Diagnosis not present

## 2014-06-18 DIAGNOSIS — N186 End stage renal disease: Secondary | ICD-10-CM | POA: Diagnosis not present

## 2014-06-18 DIAGNOSIS — D631 Anemia in chronic kidney disease: Secondary | ICD-10-CM | POA: Diagnosis not present

## 2014-06-18 DIAGNOSIS — D509 Iron deficiency anemia, unspecified: Secondary | ICD-10-CM | POA: Diagnosis not present

## 2014-06-25 DIAGNOSIS — D509 Iron deficiency anemia, unspecified: Secondary | ICD-10-CM | POA: Diagnosis not present

## 2014-06-25 DIAGNOSIS — N186 End stage renal disease: Secondary | ICD-10-CM | POA: Diagnosis not present

## 2014-06-25 DIAGNOSIS — D631 Anemia in chronic kidney disease: Secondary | ICD-10-CM | POA: Diagnosis not present

## 2014-06-27 DIAGNOSIS — D509 Iron deficiency anemia, unspecified: Secondary | ICD-10-CM | POA: Diagnosis not present

## 2014-06-27 DIAGNOSIS — D631 Anemia in chronic kidney disease: Secondary | ICD-10-CM | POA: Diagnosis not present

## 2014-06-27 DIAGNOSIS — N186 End stage renal disease: Secondary | ICD-10-CM | POA: Diagnosis not present

## 2014-06-29 DIAGNOSIS — D631 Anemia in chronic kidney disease: Secondary | ICD-10-CM | POA: Diagnosis not present

## 2014-06-29 DIAGNOSIS — D509 Iron deficiency anemia, unspecified: Secondary | ICD-10-CM | POA: Diagnosis not present

## 2014-06-29 DIAGNOSIS — N186 End stage renal disease: Secondary | ICD-10-CM | POA: Diagnosis not present

## 2014-06-30 DIAGNOSIS — Z992 Dependence on renal dialysis: Secondary | ICD-10-CM | POA: Diagnosis not present

## 2014-06-30 DIAGNOSIS — N186 End stage renal disease: Secondary | ICD-10-CM | POA: Diagnosis not present

## 2014-07-04 DIAGNOSIS — D509 Iron deficiency anemia, unspecified: Secondary | ICD-10-CM | POA: Diagnosis not present

## 2014-07-04 DIAGNOSIS — R509 Fever, unspecified: Secondary | ICD-10-CM | POA: Diagnosis not present

## 2014-07-04 DIAGNOSIS — D631 Anemia in chronic kidney disease: Secondary | ICD-10-CM | POA: Diagnosis not present

## 2014-07-04 DIAGNOSIS — N186 End stage renal disease: Secondary | ICD-10-CM | POA: Diagnosis not present

## 2014-07-06 DIAGNOSIS — D509 Iron deficiency anemia, unspecified: Secondary | ICD-10-CM | POA: Diagnosis not present

## 2014-07-06 DIAGNOSIS — N186 End stage renal disease: Secondary | ICD-10-CM | POA: Diagnosis not present

## 2014-07-06 DIAGNOSIS — R509 Fever, unspecified: Secondary | ICD-10-CM | POA: Diagnosis not present

## 2014-07-06 DIAGNOSIS — D631 Anemia in chronic kidney disease: Secondary | ICD-10-CM | POA: Diagnosis not present

## 2014-07-09 DIAGNOSIS — D509 Iron deficiency anemia, unspecified: Secondary | ICD-10-CM | POA: Diagnosis not present

## 2014-07-09 DIAGNOSIS — N186 End stage renal disease: Secondary | ICD-10-CM | POA: Diagnosis not present

## 2014-07-09 DIAGNOSIS — R509 Fever, unspecified: Secondary | ICD-10-CM | POA: Diagnosis not present

## 2014-07-09 DIAGNOSIS — D631 Anemia in chronic kidney disease: Secondary | ICD-10-CM | POA: Diagnosis not present

## 2014-07-11 DIAGNOSIS — N186 End stage renal disease: Secondary | ICD-10-CM | POA: Diagnosis not present

## 2014-07-11 DIAGNOSIS — D509 Iron deficiency anemia, unspecified: Secondary | ICD-10-CM | POA: Diagnosis not present

## 2014-07-11 DIAGNOSIS — R509 Fever, unspecified: Secondary | ICD-10-CM | POA: Diagnosis not present

## 2014-07-11 DIAGNOSIS — D631 Anemia in chronic kidney disease: Secondary | ICD-10-CM | POA: Diagnosis not present

## 2014-07-13 DIAGNOSIS — D631 Anemia in chronic kidney disease: Secondary | ICD-10-CM | POA: Diagnosis not present

## 2014-07-13 DIAGNOSIS — D509 Iron deficiency anemia, unspecified: Secondary | ICD-10-CM | POA: Diagnosis not present

## 2014-07-13 DIAGNOSIS — R509 Fever, unspecified: Secondary | ICD-10-CM | POA: Diagnosis not present

## 2014-07-13 DIAGNOSIS — N186 End stage renal disease: Secondary | ICD-10-CM | POA: Diagnosis not present

## 2014-07-16 DIAGNOSIS — D509 Iron deficiency anemia, unspecified: Secondary | ICD-10-CM | POA: Diagnosis not present

## 2014-07-16 DIAGNOSIS — R509 Fever, unspecified: Secondary | ICD-10-CM | POA: Diagnosis not present

## 2014-07-16 DIAGNOSIS — N186 End stage renal disease: Secondary | ICD-10-CM | POA: Diagnosis not present

## 2014-07-16 DIAGNOSIS — D631 Anemia in chronic kidney disease: Secondary | ICD-10-CM | POA: Diagnosis not present

## 2014-07-17 ENCOUNTER — Other Ambulatory Visit: Payer: Self-pay | Admitting: Dermatology

## 2014-07-17 DIAGNOSIS — L308 Other specified dermatitis: Secondary | ICD-10-CM | POA: Diagnosis not present

## 2014-07-17 DIAGNOSIS — L408 Other psoriasis: Secondary | ICD-10-CM | POA: Diagnosis not present

## 2014-07-18 DIAGNOSIS — R509 Fever, unspecified: Secondary | ICD-10-CM | POA: Diagnosis not present

## 2014-07-18 DIAGNOSIS — D509 Iron deficiency anemia, unspecified: Secondary | ICD-10-CM | POA: Diagnosis not present

## 2014-07-18 DIAGNOSIS — D631 Anemia in chronic kidney disease: Secondary | ICD-10-CM | POA: Diagnosis not present

## 2014-07-18 DIAGNOSIS — N186 End stage renal disease: Secondary | ICD-10-CM | POA: Diagnosis not present

## 2014-07-23 DIAGNOSIS — R509 Fever, unspecified: Secondary | ICD-10-CM | POA: Diagnosis not present

## 2014-07-23 DIAGNOSIS — D509 Iron deficiency anemia, unspecified: Secondary | ICD-10-CM | POA: Diagnosis not present

## 2014-07-23 DIAGNOSIS — D631 Anemia in chronic kidney disease: Secondary | ICD-10-CM | POA: Diagnosis not present

## 2014-07-23 DIAGNOSIS — N186 End stage renal disease: Secondary | ICD-10-CM | POA: Diagnosis not present

## 2014-07-25 DIAGNOSIS — N186 End stage renal disease: Secondary | ICD-10-CM | POA: Diagnosis not present

## 2014-07-25 DIAGNOSIS — D509 Iron deficiency anemia, unspecified: Secondary | ICD-10-CM | POA: Diagnosis not present

## 2014-07-25 DIAGNOSIS — R509 Fever, unspecified: Secondary | ICD-10-CM | POA: Diagnosis not present

## 2014-07-25 DIAGNOSIS — D631 Anemia in chronic kidney disease: Secondary | ICD-10-CM | POA: Diagnosis not present

## 2014-07-27 DIAGNOSIS — N186 End stage renal disease: Secondary | ICD-10-CM | POA: Diagnosis not present

## 2014-07-27 DIAGNOSIS — R509 Fever, unspecified: Secondary | ICD-10-CM | POA: Diagnosis not present

## 2014-07-27 DIAGNOSIS — D509 Iron deficiency anemia, unspecified: Secondary | ICD-10-CM | POA: Diagnosis not present

## 2014-07-27 DIAGNOSIS — D631 Anemia in chronic kidney disease: Secondary | ICD-10-CM | POA: Diagnosis not present

## 2014-07-29 DIAGNOSIS — Z992 Dependence on renal dialysis: Secondary | ICD-10-CM | POA: Diagnosis not present

## 2014-07-29 DIAGNOSIS — N186 End stage renal disease: Secondary | ICD-10-CM | POA: Diagnosis not present

## 2014-08-01 DIAGNOSIS — D509 Iron deficiency anemia, unspecified: Secondary | ICD-10-CM | POA: Diagnosis not present

## 2014-08-01 DIAGNOSIS — N186 End stage renal disease: Secondary | ICD-10-CM | POA: Diagnosis not present

## 2014-08-01 DIAGNOSIS — Z23 Encounter for immunization: Secondary | ICD-10-CM | POA: Diagnosis not present

## 2014-08-01 DIAGNOSIS — D631 Anemia in chronic kidney disease: Secondary | ICD-10-CM | POA: Diagnosis not present

## 2014-08-06 DIAGNOSIS — D631 Anemia in chronic kidney disease: Secondary | ICD-10-CM | POA: Diagnosis not present

## 2014-08-06 DIAGNOSIS — D509 Iron deficiency anemia, unspecified: Secondary | ICD-10-CM | POA: Diagnosis not present

## 2014-08-06 DIAGNOSIS — N186 End stage renal disease: Secondary | ICD-10-CM | POA: Diagnosis not present

## 2014-08-06 DIAGNOSIS — Z23 Encounter for immunization: Secondary | ICD-10-CM | POA: Diagnosis not present

## 2014-08-08 DIAGNOSIS — D631 Anemia in chronic kidney disease: Secondary | ICD-10-CM | POA: Diagnosis not present

## 2014-08-08 DIAGNOSIS — N186 End stage renal disease: Secondary | ICD-10-CM | POA: Diagnosis not present

## 2014-08-08 DIAGNOSIS — D509 Iron deficiency anemia, unspecified: Secondary | ICD-10-CM | POA: Diagnosis not present

## 2014-08-08 DIAGNOSIS — Z23 Encounter for immunization: Secondary | ICD-10-CM | POA: Diagnosis not present

## 2014-08-10 DIAGNOSIS — Z23 Encounter for immunization: Secondary | ICD-10-CM | POA: Diagnosis not present

## 2014-08-10 DIAGNOSIS — D509 Iron deficiency anemia, unspecified: Secondary | ICD-10-CM | POA: Diagnosis not present

## 2014-08-10 DIAGNOSIS — N186 End stage renal disease: Secondary | ICD-10-CM | POA: Diagnosis not present

## 2014-08-10 DIAGNOSIS — D631 Anemia in chronic kidney disease: Secondary | ICD-10-CM | POA: Diagnosis not present

## 2014-08-13 DIAGNOSIS — Z23 Encounter for immunization: Secondary | ICD-10-CM | POA: Diagnosis not present

## 2014-08-13 DIAGNOSIS — N186 End stage renal disease: Secondary | ICD-10-CM | POA: Diagnosis not present

## 2014-08-13 DIAGNOSIS — D631 Anemia in chronic kidney disease: Secondary | ICD-10-CM | POA: Diagnosis not present

## 2014-08-13 DIAGNOSIS — D509 Iron deficiency anemia, unspecified: Secondary | ICD-10-CM | POA: Diagnosis not present

## 2014-08-17 DIAGNOSIS — N186 End stage renal disease: Secondary | ICD-10-CM | POA: Diagnosis not present

## 2014-08-17 DIAGNOSIS — D509 Iron deficiency anemia, unspecified: Secondary | ICD-10-CM | POA: Diagnosis not present

## 2014-08-17 DIAGNOSIS — D631 Anemia in chronic kidney disease: Secondary | ICD-10-CM | POA: Diagnosis not present

## 2014-08-17 DIAGNOSIS — Z23 Encounter for immunization: Secondary | ICD-10-CM | POA: Diagnosis not present

## 2014-08-20 DIAGNOSIS — Z23 Encounter for immunization: Secondary | ICD-10-CM | POA: Diagnosis not present

## 2014-08-20 DIAGNOSIS — D509 Iron deficiency anemia, unspecified: Secondary | ICD-10-CM | POA: Diagnosis not present

## 2014-08-20 DIAGNOSIS — N186 End stage renal disease: Secondary | ICD-10-CM | POA: Diagnosis not present

## 2014-08-20 DIAGNOSIS — D631 Anemia in chronic kidney disease: Secondary | ICD-10-CM | POA: Diagnosis not present

## 2014-08-21 ENCOUNTER — Other Ambulatory Visit: Payer: Self-pay | Admitting: Internal Medicine

## 2014-08-22 DIAGNOSIS — N186 End stage renal disease: Secondary | ICD-10-CM | POA: Diagnosis not present

## 2014-08-22 DIAGNOSIS — D509 Iron deficiency anemia, unspecified: Secondary | ICD-10-CM | POA: Diagnosis not present

## 2014-08-22 DIAGNOSIS — Z23 Encounter for immunization: Secondary | ICD-10-CM | POA: Diagnosis not present

## 2014-08-22 DIAGNOSIS — D631 Anemia in chronic kidney disease: Secondary | ICD-10-CM | POA: Diagnosis not present

## 2014-08-24 DIAGNOSIS — N186 End stage renal disease: Secondary | ICD-10-CM | POA: Diagnosis not present

## 2014-08-24 DIAGNOSIS — Z23 Encounter for immunization: Secondary | ICD-10-CM | POA: Diagnosis not present

## 2014-08-24 DIAGNOSIS — D631 Anemia in chronic kidney disease: Secondary | ICD-10-CM | POA: Diagnosis not present

## 2014-08-24 DIAGNOSIS — D509 Iron deficiency anemia, unspecified: Secondary | ICD-10-CM | POA: Diagnosis not present

## 2014-08-29 ENCOUNTER — Other Ambulatory Visit: Payer: Self-pay | Admitting: Internal Medicine

## 2014-08-29 DIAGNOSIS — D509 Iron deficiency anemia, unspecified: Secondary | ICD-10-CM | POA: Diagnosis not present

## 2014-08-29 DIAGNOSIS — Z992 Dependence on renal dialysis: Secondary | ICD-10-CM | POA: Diagnosis not present

## 2014-08-29 DIAGNOSIS — N186 End stage renal disease: Secondary | ICD-10-CM | POA: Diagnosis not present

## 2014-08-29 DIAGNOSIS — Z23 Encounter for immunization: Secondary | ICD-10-CM | POA: Diagnosis not present

## 2014-08-29 DIAGNOSIS — D631 Anemia in chronic kidney disease: Secondary | ICD-10-CM | POA: Diagnosis not present

## 2014-09-03 ENCOUNTER — Other Ambulatory Visit: Payer: Self-pay | Admitting: Internal Medicine

## 2014-09-03 DIAGNOSIS — N186 End stage renal disease: Secondary | ICD-10-CM | POA: Diagnosis not present

## 2014-09-03 DIAGNOSIS — D631 Anemia in chronic kidney disease: Secondary | ICD-10-CM | POA: Diagnosis not present

## 2014-09-04 DIAGNOSIS — D631 Anemia in chronic kidney disease: Secondary | ICD-10-CM | POA: Diagnosis not present

## 2014-09-04 DIAGNOSIS — N186 End stage renal disease: Secondary | ICD-10-CM | POA: Diagnosis not present

## 2014-09-07 DIAGNOSIS — N186 End stage renal disease: Secondary | ICD-10-CM | POA: Diagnosis not present

## 2014-09-07 DIAGNOSIS — D631 Anemia in chronic kidney disease: Secondary | ICD-10-CM | POA: Diagnosis not present

## 2014-09-10 DIAGNOSIS — N186 End stage renal disease: Secondary | ICD-10-CM | POA: Diagnosis not present

## 2014-09-10 DIAGNOSIS — D631 Anemia in chronic kidney disease: Secondary | ICD-10-CM | POA: Diagnosis not present

## 2014-09-12 DIAGNOSIS — N186 End stage renal disease: Secondary | ICD-10-CM | POA: Diagnosis not present

## 2014-09-12 DIAGNOSIS — D631 Anemia in chronic kidney disease: Secondary | ICD-10-CM | POA: Diagnosis not present

## 2014-09-13 ENCOUNTER — Other Ambulatory Visit: Payer: Self-pay | Admitting: Internal Medicine

## 2014-09-17 DIAGNOSIS — N186 End stage renal disease: Secondary | ICD-10-CM | POA: Diagnosis not present

## 2014-09-17 DIAGNOSIS — D631 Anemia in chronic kidney disease: Secondary | ICD-10-CM | POA: Diagnosis not present

## 2014-09-19 DIAGNOSIS — D631 Anemia in chronic kidney disease: Secondary | ICD-10-CM | POA: Diagnosis not present

## 2014-09-19 DIAGNOSIS — N186 End stage renal disease: Secondary | ICD-10-CM | POA: Diagnosis not present

## 2014-09-21 DIAGNOSIS — N186 End stage renal disease: Secondary | ICD-10-CM | POA: Diagnosis not present

## 2014-09-21 DIAGNOSIS — D631 Anemia in chronic kidney disease: Secondary | ICD-10-CM | POA: Diagnosis not present

## 2014-09-24 DIAGNOSIS — N186 End stage renal disease: Secondary | ICD-10-CM | POA: Diagnosis not present

## 2014-09-24 DIAGNOSIS — D631 Anemia in chronic kidney disease: Secondary | ICD-10-CM | POA: Diagnosis not present

## 2014-09-25 DIAGNOSIS — T82858D Stenosis of vascular prosthetic devices, implants and grafts, subsequent encounter: Secondary | ICD-10-CM | POA: Diagnosis not present

## 2014-09-25 DIAGNOSIS — I871 Compression of vein: Secondary | ICD-10-CM | POA: Diagnosis not present

## 2014-09-25 DIAGNOSIS — Z992 Dependence on renal dialysis: Secondary | ICD-10-CM | POA: Diagnosis not present

## 2014-09-25 DIAGNOSIS — N186 End stage renal disease: Secondary | ICD-10-CM | POA: Diagnosis not present

## 2014-09-28 DIAGNOSIS — N186 End stage renal disease: Secondary | ICD-10-CM | POA: Diagnosis not present

## 2014-09-28 DIAGNOSIS — D631 Anemia in chronic kidney disease: Secondary | ICD-10-CM | POA: Diagnosis not present

## 2014-09-28 DIAGNOSIS — I120 Hypertensive chronic kidney disease with stage 5 chronic kidney disease or end stage renal disease: Secondary | ICD-10-CM | POA: Diagnosis not present

## 2014-09-28 DIAGNOSIS — Z992 Dependence on renal dialysis: Secondary | ICD-10-CM | POA: Diagnosis not present

## 2014-10-01 DIAGNOSIS — D631 Anemia in chronic kidney disease: Secondary | ICD-10-CM | POA: Diagnosis not present

## 2014-10-01 DIAGNOSIS — N186 End stage renal disease: Secondary | ICD-10-CM | POA: Diagnosis not present

## 2014-10-01 DIAGNOSIS — K7460 Unspecified cirrhosis of liver: Secondary | ICD-10-CM | POA: Diagnosis not present

## 2014-10-03 DIAGNOSIS — N186 End stage renal disease: Secondary | ICD-10-CM | POA: Diagnosis not present

## 2014-10-03 DIAGNOSIS — D631 Anemia in chronic kidney disease: Secondary | ICD-10-CM | POA: Diagnosis not present

## 2014-10-03 DIAGNOSIS — K7460 Unspecified cirrhosis of liver: Secondary | ICD-10-CM | POA: Diagnosis not present

## 2014-10-05 DIAGNOSIS — D631 Anemia in chronic kidney disease: Secondary | ICD-10-CM | POA: Diagnosis not present

## 2014-10-05 DIAGNOSIS — K7460 Unspecified cirrhosis of liver: Secondary | ICD-10-CM | POA: Diagnosis not present

## 2014-10-05 DIAGNOSIS — N186 End stage renal disease: Secondary | ICD-10-CM | POA: Diagnosis not present

## 2014-10-08 DIAGNOSIS — D631 Anemia in chronic kidney disease: Secondary | ICD-10-CM | POA: Diagnosis not present

## 2014-10-08 DIAGNOSIS — N186 End stage renal disease: Secondary | ICD-10-CM | POA: Diagnosis not present

## 2014-10-08 DIAGNOSIS — K7460 Unspecified cirrhosis of liver: Secondary | ICD-10-CM | POA: Diagnosis not present

## 2014-10-11 ENCOUNTER — Other Ambulatory Visit: Payer: Self-pay | Admitting: Internal Medicine

## 2014-10-12 DIAGNOSIS — N186 End stage renal disease: Secondary | ICD-10-CM | POA: Diagnosis not present

## 2014-10-12 DIAGNOSIS — K7460 Unspecified cirrhosis of liver: Secondary | ICD-10-CM | POA: Diagnosis not present

## 2014-10-12 DIAGNOSIS — D631 Anemia in chronic kidney disease: Secondary | ICD-10-CM | POA: Diagnosis not present

## 2014-10-15 DIAGNOSIS — D631 Anemia in chronic kidney disease: Secondary | ICD-10-CM | POA: Diagnosis not present

## 2014-10-15 DIAGNOSIS — N186 End stage renal disease: Secondary | ICD-10-CM | POA: Diagnosis not present

## 2014-10-15 DIAGNOSIS — K7460 Unspecified cirrhosis of liver: Secondary | ICD-10-CM | POA: Diagnosis not present

## 2014-10-17 DIAGNOSIS — K7460 Unspecified cirrhosis of liver: Secondary | ICD-10-CM | POA: Diagnosis not present

## 2014-10-17 DIAGNOSIS — D631 Anemia in chronic kidney disease: Secondary | ICD-10-CM | POA: Diagnosis not present

## 2014-10-17 DIAGNOSIS — N186 End stage renal disease: Secondary | ICD-10-CM | POA: Diagnosis not present

## 2014-10-22 DIAGNOSIS — K7460 Unspecified cirrhosis of liver: Secondary | ICD-10-CM | POA: Diagnosis not present

## 2014-10-22 DIAGNOSIS — D631 Anemia in chronic kidney disease: Secondary | ICD-10-CM | POA: Diagnosis not present

## 2014-10-22 DIAGNOSIS — N186 End stage renal disease: Secondary | ICD-10-CM | POA: Diagnosis not present

## 2014-10-24 DIAGNOSIS — K7460 Unspecified cirrhosis of liver: Secondary | ICD-10-CM | POA: Diagnosis not present

## 2014-10-24 DIAGNOSIS — N186 End stage renal disease: Secondary | ICD-10-CM | POA: Diagnosis not present

## 2014-10-24 DIAGNOSIS — D631 Anemia in chronic kidney disease: Secondary | ICD-10-CM | POA: Diagnosis not present

## 2014-10-26 DIAGNOSIS — N186 End stage renal disease: Secondary | ICD-10-CM | POA: Diagnosis not present

## 2014-10-26 DIAGNOSIS — D631 Anemia in chronic kidney disease: Secondary | ICD-10-CM | POA: Diagnosis not present

## 2014-10-26 DIAGNOSIS — K7460 Unspecified cirrhosis of liver: Secondary | ICD-10-CM | POA: Diagnosis not present

## 2014-10-29 DIAGNOSIS — K7460 Unspecified cirrhosis of liver: Secondary | ICD-10-CM | POA: Diagnosis not present

## 2014-10-29 DIAGNOSIS — D631 Anemia in chronic kidney disease: Secondary | ICD-10-CM | POA: Diagnosis not present

## 2014-10-29 DIAGNOSIS — I120 Hypertensive chronic kidney disease with stage 5 chronic kidney disease or end stage renal disease: Secondary | ICD-10-CM | POA: Diagnosis not present

## 2014-10-29 DIAGNOSIS — N186 End stage renal disease: Secondary | ICD-10-CM | POA: Diagnosis not present

## 2014-10-29 DIAGNOSIS — Z992 Dependence on renal dialysis: Secondary | ICD-10-CM | POA: Diagnosis not present

## 2014-10-30 ENCOUNTER — Ambulatory Visit: Payer: Medicare Other | Attending: Family Medicine | Admitting: Family Medicine

## 2014-10-30 ENCOUNTER — Encounter: Payer: Self-pay | Admitting: Family Medicine

## 2014-10-30 VITALS — BP 106/66 | HR 81 | Temp 98.6°F | Resp 16 | Ht 67.0 in | Wt 138.0 lb

## 2014-10-30 DIAGNOSIS — L309 Dermatitis, unspecified: Secondary | ICD-10-CM | POA: Diagnosis not present

## 2014-10-30 DIAGNOSIS — K257 Chronic gastric ulcer without hemorrhage or perforation: Secondary | ICD-10-CM

## 2014-10-30 DIAGNOSIS — Z90710 Acquired absence of both cervix and uterus: Secondary | ICD-10-CM | POA: Diagnosis not present

## 2014-10-30 DIAGNOSIS — I10 Essential (primary) hypertension: Secondary | ICD-10-CM | POA: Diagnosis not present

## 2014-10-30 DIAGNOSIS — Z23 Encounter for immunization: Secondary | ICD-10-CM | POA: Diagnosis not present

## 2014-10-30 DIAGNOSIS — Z0000 Encounter for general adult medical examination without abnormal findings: Secondary | ICD-10-CM | POA: Diagnosis not present

## 2014-10-30 MED ORDER — SEVELAMER CARBONATE 800 MG PO TABS: 2400.0000 mg | ORAL_TABLET | Freq: Three times a day (TID) | ORAL | Status: DC

## 2014-10-30 MED ORDER — PANTOPRAZOLE SODIUM 40 MG PO TBEC: 40.0000 mg | DELAYED_RELEASE_TABLET | Freq: Two times a day (BID) | ORAL | Status: DC

## 2014-10-30 MED ORDER — METOPROLOL TARTRATE 50 MG PO TABS: 50.0000 mg | ORAL_TABLET | Freq: Two times a day (BID) | ORAL | Status: DC

## 2014-10-30 MED ORDER — AMLODIPINE BESYLATE 5 MG PO TABS: 5.0000 mg | ORAL_TABLET | Freq: Every day | ORAL | Status: DC

## 2014-10-30 NOTE — Assessment & Plan Note (Signed)
A: Skin itching consistent with dermatitis P: Shower in warm water, avoid hot water  Pat dry Apply moisturizer- eucerin, cerave, lac hydrin

## 2014-10-30 NOTE — Assessment & Plan Note (Signed)
Healthcare maintenance: Ordered mammogram, colonoscopy, due for Tdap

## 2014-10-30 NOTE — Assessment & Plan Note (Signed)
1. HTN: overtreatd with low BP Decrease norvasc to 5 mg once daily  Continue toprol 50 mg BID with plan to decrease if needed to 25 mg BID at f/u

## 2014-10-30 NOTE — Patient Instructions (Signed)
Karen Morrow   1. HTN: Decrease norvasc to 5 mg once daily   2. Skin rash with itching: Shower in warm water, avoid hot Pat dry Apply moisturizer- eucerin, cerave, lac hydrin   3. Healthcare maintenance: Ordered mammogram, colonoscopy, due for Tdap  F/u in 4 weeks with RN for BP check  F/u with me in 3 months for hypertension  Dr. Adrian Blackwater   Dr. Adrian Blackwater

## 2014-10-30 NOTE — Progress Notes (Signed)
   Subjective:    Patient ID: Karen Morrow, female    DOB: Dec 06, 1960, 54 y.o.   MRN: [id number] CC: establish care, HTN HPI  1. HTN: taking norvasc and metoprolol. Has low BP before and during HD. HD is TTS. 3 hrs and 45 mins. No high BPs. Feels tired during HD.   2. Skin issue: itching in legs x 1 yrs since starting HD. Worse at night. Benadryl does not help. No close contacts with rash.   3. HM: due for tdap, had hysterectomy for fibroids while in [place]. Due for mammogram and screening colonoscopy.   Soc Hx: light smoker  Review of Systems  Constitutional: Negative.  Negative for fever and chills.  Cardiovascular: Negative.  Negative for chest pain, palpitations and leg swelling.  Gastrointestinal: Negative for nausea, vomiting and abdominal pain.      Objective:   Physical Exam BP 106/66 mmHg  Pulse 81  Temp(Src) 98.6 F (37 C) (Oral)  Resp 16  Ht 5\' 7"  (1.702 m)  Wt 138 lb (62.596 kg)  BMI 21.61 kg/m2  SpO2 100% General appearance: alert, cooperative and no distress , thin, A-A female Lungs: clear to auscultation bilaterally Heart: regular rate and rhythm, S1, S2 normal and systolic murmur: early systolic 3/6, blowing throughout the precordium Extremities: extremities normal, atraumatic, no cyanosis or edema, R arm AV fistula with palpable thrill Skin: evidence of excoriation on legs, no rash       Assessment & Plan:

## 2014-10-30 NOTE — Progress Notes (Signed)
Establish Care HX HTN Unsure of medications name

## 2014-11-02 DIAGNOSIS — D631 Anemia in chronic kidney disease: Secondary | ICD-10-CM | POA: Diagnosis not present

## 2014-11-02 DIAGNOSIS — N186 End stage renal disease: Secondary | ICD-10-CM | POA: Diagnosis not present

## 2014-11-02 DIAGNOSIS — A498 Other bacterial infections of unspecified site: Secondary | ICD-10-CM | POA: Diagnosis not present

## 2014-11-05 DIAGNOSIS — D631 Anemia in chronic kidney disease: Secondary | ICD-10-CM | POA: Diagnosis not present

## 2014-11-05 DIAGNOSIS — A498 Other bacterial infections of unspecified site: Secondary | ICD-10-CM | POA: Diagnosis not present

## 2014-11-05 DIAGNOSIS — N186 End stage renal disease: Secondary | ICD-10-CM | POA: Diagnosis not present

## 2014-11-07 DIAGNOSIS — N186 End stage renal disease: Secondary | ICD-10-CM | POA: Diagnosis not present

## 2014-11-07 DIAGNOSIS — D631 Anemia in chronic kidney disease: Secondary | ICD-10-CM | POA: Diagnosis not present

## 2014-11-07 DIAGNOSIS — A498 Other bacterial infections of unspecified site: Secondary | ICD-10-CM | POA: Diagnosis not present

## 2014-11-09 DIAGNOSIS — D631 Anemia in chronic kidney disease: Secondary | ICD-10-CM | POA: Diagnosis not present

## 2014-11-09 DIAGNOSIS — N186 End stage renal disease: Secondary | ICD-10-CM | POA: Diagnosis not present

## 2014-11-09 DIAGNOSIS — A498 Other bacterial infections of unspecified site: Secondary | ICD-10-CM | POA: Diagnosis not present

## 2014-11-12 DIAGNOSIS — A498 Other bacterial infections of unspecified site: Secondary | ICD-10-CM | POA: Diagnosis not present

## 2014-11-12 DIAGNOSIS — N186 End stage renal disease: Secondary | ICD-10-CM | POA: Diagnosis not present

## 2014-11-12 DIAGNOSIS — D631 Anemia in chronic kidney disease: Secondary | ICD-10-CM | POA: Diagnosis not present

## 2014-11-14 DIAGNOSIS — N186 End stage renal disease: Secondary | ICD-10-CM | POA: Diagnosis not present

## 2014-11-14 DIAGNOSIS — D631 Anemia in chronic kidney disease: Secondary | ICD-10-CM | POA: Diagnosis not present

## 2014-11-14 DIAGNOSIS — A498 Other bacterial infections of unspecified site: Secondary | ICD-10-CM | POA: Diagnosis not present

## 2014-11-19 DIAGNOSIS — D631 Anemia in chronic kidney disease: Secondary | ICD-10-CM | POA: Diagnosis not present

## 2014-11-19 DIAGNOSIS — N186 End stage renal disease: Secondary | ICD-10-CM | POA: Diagnosis not present

## 2014-11-19 DIAGNOSIS — A498 Other bacterial infections of unspecified site: Secondary | ICD-10-CM | POA: Diagnosis not present

## 2014-11-21 ENCOUNTER — Encounter: Payer: Self-pay | Admitting: Family

## 2014-11-21 DIAGNOSIS — D631 Anemia in chronic kidney disease: Secondary | ICD-10-CM | POA: Diagnosis not present

## 2014-11-21 DIAGNOSIS — N186 End stage renal disease: Secondary | ICD-10-CM | POA: Diagnosis not present

## 2014-11-21 DIAGNOSIS — A498 Other bacterial infections of unspecified site: Secondary | ICD-10-CM | POA: Diagnosis not present

## 2014-11-22 ENCOUNTER — Ambulatory Visit: Payer: Medicare Other | Admitting: Family

## 2014-11-23 DIAGNOSIS — A498 Other bacterial infections of unspecified site: Secondary | ICD-10-CM | POA: Diagnosis not present

## 2014-11-23 DIAGNOSIS — N186 End stage renal disease: Secondary | ICD-10-CM | POA: Diagnosis not present

## 2014-11-23 DIAGNOSIS — D631 Anemia in chronic kidney disease: Secondary | ICD-10-CM | POA: Diagnosis not present

## 2014-11-26 DIAGNOSIS — D631 Anemia in chronic kidney disease: Secondary | ICD-10-CM | POA: Diagnosis not present

## 2014-11-26 DIAGNOSIS — N186 End stage renal disease: Secondary | ICD-10-CM | POA: Diagnosis not present

## 2014-11-26 DIAGNOSIS — A498 Other bacterial infections of unspecified site: Secondary | ICD-10-CM | POA: Diagnosis not present

## 2014-11-28 DIAGNOSIS — A498 Other bacterial infections of unspecified site: Secondary | ICD-10-CM | POA: Diagnosis not present

## 2014-11-28 DIAGNOSIS — N186 End stage renal disease: Secondary | ICD-10-CM | POA: Diagnosis not present

## 2014-11-28 DIAGNOSIS — D631 Anemia in chronic kidney disease: Secondary | ICD-10-CM | POA: Diagnosis not present

## 2014-11-28 DIAGNOSIS — Z992 Dependence on renal dialysis: Secondary | ICD-10-CM | POA: Diagnosis not present

## 2014-11-28 DIAGNOSIS — I120 Hypertensive chronic kidney disease with stage 5 chronic kidney disease or end stage renal disease: Secondary | ICD-10-CM | POA: Diagnosis not present

## 2014-11-29 ENCOUNTER — Telehealth: Payer: Self-pay | Admitting: Vascular Surgery

## 2014-11-29 NOTE — Telephone Encounter (Signed)
Phone call to pt.  Stated she couldn't get to her appt. on 11/22/14, and has been trying to reschedule.  Reported her right upper arm fistula site "is puffy and has a little pus-like drainage."  Stated the Dialysis center started her on an antibiotic with her treatment.  Denied fever/ chills.  Stated she dialyzes on T-Th-Sat.  Offered an appt. on AM of 7/7, but stated she cannot get here on a Thursday, due to the time of her transportation to the dialysis center.  Gave appt. @ 9:30 AM 12/06/14 with NP.  Advised to go to the ER if symptoms of increased redness/ inflammation, increased pain, fever/ chills, or worsening drainage.  Pt verb. understanding; agrees with appt. on 12/06/14.

## 2014-11-29 NOTE — Telephone Encounter (Signed)
Pt called in at 447 pm. She states she has been calling our office since the 26 th of this month. The reschedule her appointment. She states something about her line needs to be evaluated. She had dialysis on the same day of her schedule appointment here in the office. Per patient no one has tried to call her and she does not want to continue to leave message on the voice mail. Also she was told that a message will be given directly to the nurse twice. Pt states forget it she will just come up here on Wednesday December 04, 2014 to see if she speak with someone.

## 2014-12-03 ENCOUNTER — Encounter: Payer: Self-pay | Admitting: Family

## 2014-12-03 DIAGNOSIS — N186 End stage renal disease: Secondary | ICD-10-CM | POA: Diagnosis not present

## 2014-12-03 DIAGNOSIS — D631 Anemia in chronic kidney disease: Secondary | ICD-10-CM | POA: Diagnosis not present

## 2014-12-03 DIAGNOSIS — A498 Other bacterial infections of unspecified site: Secondary | ICD-10-CM | POA: Diagnosis not present

## 2014-12-03 DIAGNOSIS — K7460 Unspecified cirrhosis of liver: Secondary | ICD-10-CM | POA: Diagnosis not present

## 2014-12-05 DIAGNOSIS — K7460 Unspecified cirrhosis of liver: Secondary | ICD-10-CM | POA: Diagnosis not present

## 2014-12-05 DIAGNOSIS — N186 End stage renal disease: Secondary | ICD-10-CM | POA: Diagnosis not present

## 2014-12-05 DIAGNOSIS — D631 Anemia in chronic kidney disease: Secondary | ICD-10-CM | POA: Diagnosis not present

## 2014-12-05 DIAGNOSIS — A498 Other bacterial infections of unspecified site: Secondary | ICD-10-CM | POA: Diagnosis not present

## 2014-12-06 ENCOUNTER — Ambulatory Visit (INDEPENDENT_AMBULATORY_CARE_PROVIDER_SITE_OTHER): Payer: Medicare Other | Admitting: Family

## 2014-12-06 ENCOUNTER — Encounter: Payer: Self-pay | Admitting: Family

## 2014-12-06 VITALS — BP 100/66 | HR 75 | Resp 14 | Ht 67.0 in | Wt 142.0 lb

## 2014-12-06 DIAGNOSIS — N186 End stage renal disease: Secondary | ICD-10-CM | POA: Diagnosis not present

## 2014-12-06 DIAGNOSIS — I770 Arteriovenous fistula, acquired: Secondary | ICD-10-CM | POA: Diagnosis not present

## 2014-12-06 NOTE — Progress Notes (Signed)
Established Dialysis Access  History of Present Illness  Karen Morrow is a 54 y.o. (Dec 21, 1960) female  patient of Dr. Donnetta Hutching who is S/P 2nd stage BVT 03-06-14 by Dr. Donnetta Hutching. She comes in today with c/o scant pus like drainage from a previous accessed spot on her AVF before dialysis last week.  No problems with accessing the fistula per pt. She was given antibx during dialysis yesterday. She seeks reassurance that her AVF is OK as it feels OK.  She denies pain. Patient denies fever or chills, denies tingling, numbness, cold sensation, or pain in right hand. She dialyzes T-TH-Sat.    Past Medical History  Diagnosis Date  . Asthma   . Hepatitis C antibody test positive   . Hypertension     "just dx'd today" (11/03/2012)  . Anginal pain   . Chronic kidney disease     dialysis 3x wk    Social History History  Substance Use Topics  . Smoking status: Light Tobacco Smoker -- 0.50 packs/day for 20 years    Types: Cigarettes  . Smokeless tobacco: Never Used  . Alcohol Use: 27.6 oz/week    46 Cans of beer per week     Comment: 11/03/2012 "1-2 40oz beers/day maybe"    Family History Family History  Problem Relation Age of Onset  . Lupus Mother   . Diabetes Father   . Heart attack Father     Surgical History Past Surgical History  Procedure Laterality Date  . Abdominal hysterectomy      "partial" (11/03/2012)  . Esophagogastroduodenoscopy N/A 06/05/2013    Procedure: ESOPHAGOGASTRODUODENOSCOPY (EGD);  Surgeon: Winfield Cunas., MD;  Location: [facility];  Service: Endoscopy;  Laterality: N/A;  . Av fistula placement Right 12/03/2013    Procedure: CREATION OF ARTERIOVENOUS (AV) FISTULA RIGHT ARM ;  Surgeon: Rosetta Posner, MD;  Location: [facility];  Service: Vascular;  Laterality: Right;  . Bascilic vein transposition Right 03/06/2014    Procedure: SECOND STAGE BASILIC VEIN TRANSPOSITION;  Surgeon: Rosetta Posner, MD;  Location: [facility];  Service: Vascular;  Laterality: Right;     Allergies  Allergen Reactions  . Cefazolin     Severe thrombocytopenia    Current Outpatient Prescriptions  Medication Sig Dispense Refill  . amLODipine (NORVASC) 5 MG tablet Take 1 tablet (5 mg total) by mouth daily. 30 tablet 5  . metoprolol (LOPRESSOR) 50 MG tablet Take 1 tablet (50 mg total) by mouth 2 (two) times daily. 60 tablet 3  . pantoprazole (PROTONIX) 40 MG tablet Take 1 tablet (40 mg total) by mouth 2 (two) times daily. 60 tablet 0  . sevelamer carbonate (RENVELA) 800 MG tablet Take 3 tablets (2,400 mg total) by mouth 3 (three) times daily with meals. 90 tablet 5   No current facility-administered medications for this visit.     REVIEW OF SYSTEMS: see HPI for pertinent positives and negatives    PHYSICAL EXAMINATION:  Filed Vitals:   12/06/14 0908  BP: 100/66  Pulse: 75  Resp: 14  Height: 5\' 7"  (1.702 m)  Weight: 142 lb (64.411 kg)   Body mass index is 22.24 kg/(m^2).  General: The patient appears their stated age.   HEENT:  No gross abnormalities Pulmonary: Respirations are non-labored Abdomen: Soft and non-tender. Musculoskeletal: There are no major deformities.   Neurologic: No focal weakness or paresthesias are detected, Skin: There are no ulcer or rashes noted. Psychiatric: The patient has normal affect. Cardiovascular: There is a regular  rate and rhythm. Palpable thrill and audible bruit over right upper arm BVT. Bilateral radial pulses are faintly palpable.    Medical Decision Making  Karen Morrow is a 54 y.o. female who is S/P 2nd stage BVT 03-06-14 by Dr. Donnetta Hutching. She comes in today with c/o scant pus like drainage from a previous accessed spot on her AVF before dialysis last week.  No problems with accessing the fistula per pt. She was given antibx during dialysis yesterday. She seeks reassurance that her AVF is OK as it feels OK. She denies pain. Patient denies fever or chills, denies tingling, numbness, cold sensation, or pain in right  hand. She dialyzes T-TH-Sat. Dr. Bridgett Larsson spoke with and examined pt; no intervention required at this time. May continue to access AVF for HD Pt advised to call our office should she have concerns about her AVF; otherwise, return as needed.   Danaly Bari, [name], RN, MSN, FNP-C [facility] Office: [phone]  12/06/2014, 9:27 AM  Clinic MD: Bridgett Larsson

## 2014-12-07 DIAGNOSIS — A498 Other bacterial infections of unspecified site: Secondary | ICD-10-CM | POA: Diagnosis not present

## 2014-12-07 DIAGNOSIS — D631 Anemia in chronic kidney disease: Secondary | ICD-10-CM | POA: Diagnosis not present

## 2014-12-07 DIAGNOSIS — K7460 Unspecified cirrhosis of liver: Secondary | ICD-10-CM | POA: Diagnosis not present

## 2014-12-07 DIAGNOSIS — N186 End stage renal disease: Secondary | ICD-10-CM | POA: Diagnosis not present

## 2014-12-10 DIAGNOSIS — A498 Other bacterial infections of unspecified site: Secondary | ICD-10-CM | POA: Diagnosis not present

## 2014-12-10 DIAGNOSIS — K7460 Unspecified cirrhosis of liver: Secondary | ICD-10-CM | POA: Diagnosis not present

## 2014-12-10 DIAGNOSIS — N186 End stage renal disease: Secondary | ICD-10-CM | POA: Diagnosis not present

## 2014-12-10 DIAGNOSIS — D631 Anemia in chronic kidney disease: Secondary | ICD-10-CM | POA: Diagnosis not present

## 2014-12-11 ENCOUNTER — Ambulatory Visit: Payer: Medicare Other | Attending: Family Medicine | Admitting: Family Medicine

## 2014-12-11 ENCOUNTER — Encounter: Payer: Self-pay | Admitting: Family Medicine

## 2014-12-11 VITALS — BP 92/62 | HR 80 | Temp 97.8°F | Resp 16 | Ht 67.0 in | Wt 144.0 lb

## 2014-12-11 DIAGNOSIS — H00019 Hordeolum externum unspecified eye, unspecified eyelid: Secondary | ICD-10-CM | POA: Insufficient documentation

## 2014-12-11 DIAGNOSIS — L298 Other pruritus: Secondary | ICD-10-CM | POA: Insufficient documentation

## 2014-12-11 DIAGNOSIS — F17200 Nicotine dependence, unspecified, uncomplicated: Secondary | ICD-10-CM | POA: Diagnosis not present

## 2014-12-11 DIAGNOSIS — H00016 Hordeolum externum left eye, unspecified eyelid: Secondary | ICD-10-CM | POA: Diagnosis not present

## 2014-12-11 DIAGNOSIS — Z992 Dependence on renal dialysis: Secondary | ICD-10-CM | POA: Insufficient documentation

## 2014-12-11 DIAGNOSIS — L299 Pruritus, unspecified: Secondary | ICD-10-CM | POA: Diagnosis not present

## 2014-12-11 DIAGNOSIS — E782 Mixed hyperlipidemia: Secondary | ICD-10-CM | POA: Diagnosis not present

## 2014-12-11 DIAGNOSIS — H0261 Xanthelasma of right upper eyelid: Secondary | ICD-10-CM | POA: Diagnosis not present

## 2014-12-11 DIAGNOSIS — I10 Essential (primary) hypertension: Secondary | ICD-10-CM | POA: Diagnosis not present

## 2014-12-11 LAB — LIPID PANEL
Cholesterol: 127 mg/dL (ref 0–200)
HDL: 57 mg/dL (ref >=46)
LDL Cholesterol: 46 mg/dL (ref 0–99)
Total CHOL/HDL Ratio: 2.2 Ratio
Triglycerides: 122 mg/dL (ref <150)
VLDL: 24 mg/dL (ref 0–40)

## 2014-12-11 MED ORDER — AMLODIPINE BESYLATE 2.5 MG PO TABS: 2.5000 mg | ORAL_TABLET | Freq: Every day | ORAL | Status: DC

## 2014-12-11 MED ORDER — HYDROXYZINE PAMOATE 25 MG PO CAPS: 25.0000 mg | ORAL_CAPSULE | Freq: Three times a day (TID) | ORAL | Status: DC | PRN

## 2014-12-11 NOTE — Assessment & Plan Note (Signed)
1. HTN: BP at goal and low often Decrease norvasc to 2.5 mg daily, new rx sent in

## 2014-12-11 NOTE — Progress Notes (Signed)
   Subjective:    Patient ID: Karen Morrow, female    DOB: 23-Aug-1960, 54 y.o.   MRN: [id number] CC: eye problem, f/u HTN  HPI  1. CHRONIC HYPERTENSION  Disease Monitoring  Blood pressure range: normal but low on HD days   Chest pain: no   Dyspnea: no   Claudication: no   Medication compliance: yes  Medication Side Effects  Lightheadedness: yes, on HD days    Urinary frequency: no   Edema: no      2. Eye problem: has bump on both eyelids with mild irritation. L worse than R. R lid bump comes and goes. No eye trauma.   3. Poor sleeping: poor sleep associated with itching especially after dialysis. Has tried hydroxyzine in the past which helped. Denies depressed mood.   Soc Hx: current smoker  Review of Systems  Eyes: Positive for itching.  Cardiovascular: Negative for chest pain, palpitations and leg swelling.  Neurological: Positive for light-headedness.       Objective:   Physical Exam BP 92/62 mmHg  Pulse 80  Temp(Src) 97.8 F (36.6 C) (Oral)  Resp 16  Ht 5\' 7"  (1.702 m)  Wt 144 lb (65.318 kg)  BMI 22.55 kg/m2  SpO2 100% General appearance: alert, cooperative and no distress  Eyes: yellow tinged fleshy papule on R upper eyelid, skin colored papule on L upper eye lid adjacent to lash line  Lungs: clear to auscultation bilaterally Heart: regular rate and rhythm, S1, S2 normal, no murmur, click, rub or gallop Extremities: extremities normal, atraumatic, no cyanosis or edema       Assessment & Plan:

## 2014-12-11 NOTE — Assessment & Plan Note (Signed)
Poor sleep with itching: hydroxyzine sent in.

## 2014-12-11 NOTE — Progress Notes (Signed)
Establish Care with PCP Pt concern BP running low after dialysis Complaining of bump on both eyes  Stated need Rx for sleep medication and itching   Hx tobacco -2- 5 cigarette per day

## 2014-12-11 NOTE — Assessment & Plan Note (Signed)
  2. Stye on L lid- ophthalmology referral  Use warm pack on L eye lid, hot wash cloth or rice warmed in a sack  Avoid eye make up and gently wash lids once daily with warm water with a little bit of soap.

## 2014-12-11 NOTE — Assessment & Plan Note (Signed)
  3. Xanthelasma on R lid-ophthalmology referral  Xanthelasma are harmless, yellow bumps that occur when certain fats build up under the skin. These bumps tend to appear in older adults. In some cases, they indicate high cholesterol levels Checking cholesterol levels today

## 2014-12-11 NOTE — Patient Instructions (Signed)
Karen Morrow,  Thank you for coming in today  1. HTN: BP at goal and low often Decrease norvasc to 2.5 mg daily, new rx sent in  2. Stye on L lid- ophthalmology referral  Use warm pack on L eye lid, hot wash cloth or rice warmed in a sack  Avoid eye make up and gently wash lids once daily with warm water with a little bit of soap.   3. Xanthelasma on R lid-ophthalmology referral  Xanthelasma are harmless, yellow bumps that occur when certain fats build up under the skin. These bumps tend to appear in older adults. In some cases, they indicate high cholesterol levels Checking cholesterol levels today   4. Poor sleep with itching: hydroxyzine sent in.   F/u with RN in 3-4 weeks for BP check F/u with me in 3 months for HTN  Dr. Adrian Blackwater   Sty A sty (hordeolum) is an infection of a gland in the eyelid located at the base of the eyelash. A sty may develop a white or yellow head of pus. It can be puffy (swollen). Usually, the sty will burst and pus will come out on its own. They do not leave lumps in the eyelid once they drain. A sty is often confused with another form of cyst of the eyelid called a chalazion. Chalazions occur within the eyelid and not on the edge where the bases of the eyelashes are. They often are red, sore and then form firm lumps in the eyelid. CAUSES   Germs (bacteria).  Lasting (chronic) eyelid inflammation. SYMPTOMS   Tenderness, redness and swelling along the edge of the eyelid at the base of the eyelashes.  Sometimes, there is a white or yellow head of pus. It may or may not drain. DIAGNOSIS  An ophthalmologist will be able to distinguish between a sty and a chalazion and treat the condition appropriately.  TREATMENT   Styes are typically treated with warm packs (compresses) until drainage occurs.  In rare cases, medicines that kill germs (antibiotics) may be prescribed. These antibiotics may be in the form of drops, cream or pills.  If a hard lump has  formed, it is generally necessary to do a small incision and remove the hardened contents of the cyst in a minor surgical procedure done in the office.  In suspicious cases, your caregiver may send the contents of the cyst to the lab to be certain that it is not a rare, but dangerous form of cancer of the glands of the eyelid. HOME CARE INSTRUCTIONS   Wash your hands often and dry them with a clean towel. Avoid touching your eyelid. This may spread the infection to other parts of the eye.  Apply heat to your eyelid for 10 to 20 minutes, several times a day, to ease pain and help to heal it faster.  Do not squeeze the sty. Allow it to drain on its own. Wash your eyelid carefully 3 to 4 times per day to remove any pus. SEEK IMMEDIATE MEDICAL CARE IF:   Your eye becomes painful or puffy (swollen).  Your vision changes.  Your sty does not drain by itself within 3 days.  Your sty comes back within a short period of time, even with treatment.  You have redness (inflammation) around the eye.  You have a fever. Document Released: 02/24/2005 Document Revised: 08/09/2011 Document Reviewed: 08/31/2013 [facility] Patient Information 2015 [place], [place]. This information is not intended to replace advice given to you by your health care  provider. Make sure you discuss any questions you have with your health care provider.

## 2014-12-12 DIAGNOSIS — K7460 Unspecified cirrhosis of liver: Secondary | ICD-10-CM | POA: Diagnosis not present

## 2014-12-12 DIAGNOSIS — A498 Other bacterial infections of unspecified site: Secondary | ICD-10-CM | POA: Diagnosis not present

## 2014-12-12 DIAGNOSIS — N186 End stage renal disease: Secondary | ICD-10-CM | POA: Diagnosis not present

## 2014-12-12 DIAGNOSIS — D631 Anemia in chronic kidney disease: Secondary | ICD-10-CM | POA: Diagnosis not present

## 2014-12-13 ENCOUNTER — Telehealth: Payer: Self-pay | Admitting: *Deleted

## 2014-12-13 NOTE — Telephone Encounter (Signed)
LVM to return call.

## 2014-12-13 NOTE — Telephone Encounter (Signed)
-----   Message from Boykin Nearing, MD sent at 12/13/2014  8:41 AM EDT ----- Normal lipids

## 2014-12-17 DIAGNOSIS — K7460 Unspecified cirrhosis of liver: Secondary | ICD-10-CM | POA: Diagnosis not present

## 2014-12-17 DIAGNOSIS — A498 Other bacterial infections of unspecified site: Secondary | ICD-10-CM | POA: Diagnosis not present

## 2014-12-17 DIAGNOSIS — N186 End stage renal disease: Secondary | ICD-10-CM | POA: Diagnosis not present

## 2014-12-17 DIAGNOSIS — D631 Anemia in chronic kidney disease: Secondary | ICD-10-CM | POA: Diagnosis not present

## 2014-12-19 DIAGNOSIS — D631 Anemia in chronic kidney disease: Secondary | ICD-10-CM | POA: Diagnosis not present

## 2014-12-19 DIAGNOSIS — A498 Other bacterial infections of unspecified site: Secondary | ICD-10-CM | POA: Diagnosis not present

## 2014-12-19 DIAGNOSIS — N186 End stage renal disease: Secondary | ICD-10-CM | POA: Diagnosis not present

## 2014-12-19 DIAGNOSIS — K7460 Unspecified cirrhosis of liver: Secondary | ICD-10-CM | POA: Diagnosis not present

## 2014-12-21 DIAGNOSIS — A498 Other bacterial infections of unspecified site: Secondary | ICD-10-CM | POA: Diagnosis not present

## 2014-12-21 DIAGNOSIS — N186 End stage renal disease: Secondary | ICD-10-CM | POA: Diagnosis not present

## 2014-12-21 DIAGNOSIS — K7460 Unspecified cirrhosis of liver: Secondary | ICD-10-CM | POA: Diagnosis not present

## 2014-12-21 DIAGNOSIS — D631 Anemia in chronic kidney disease: Secondary | ICD-10-CM | POA: Diagnosis not present

## 2014-12-24 DIAGNOSIS — K7460 Unspecified cirrhosis of liver: Secondary | ICD-10-CM | POA: Diagnosis not present

## 2014-12-24 DIAGNOSIS — D631 Anemia in chronic kidney disease: Secondary | ICD-10-CM | POA: Diagnosis not present

## 2014-12-24 DIAGNOSIS — A498 Other bacterial infections of unspecified site: Secondary | ICD-10-CM | POA: Diagnosis not present

## 2014-12-24 DIAGNOSIS — N186 End stage renal disease: Secondary | ICD-10-CM | POA: Diagnosis not present

## 2014-12-26 DIAGNOSIS — K7460 Unspecified cirrhosis of liver: Secondary | ICD-10-CM | POA: Diagnosis not present

## 2014-12-26 DIAGNOSIS — D631 Anemia in chronic kidney disease: Secondary | ICD-10-CM | POA: Diagnosis not present

## 2014-12-26 DIAGNOSIS — A498 Other bacterial infections of unspecified site: Secondary | ICD-10-CM | POA: Diagnosis not present

## 2014-12-26 DIAGNOSIS — N186 End stage renal disease: Secondary | ICD-10-CM | POA: Diagnosis not present

## 2014-12-29 DIAGNOSIS — Z992 Dependence on renal dialysis: Secondary | ICD-10-CM | POA: Diagnosis not present

## 2014-12-29 DIAGNOSIS — N186 End stage renal disease: Secondary | ICD-10-CM | POA: Diagnosis not present

## 2014-12-29 DIAGNOSIS — I120 Hypertensive chronic kidney disease with stage 5 chronic kidney disease or end stage renal disease: Secondary | ICD-10-CM | POA: Diagnosis not present

## 2014-12-31 DIAGNOSIS — N186 End stage renal disease: Secondary | ICD-10-CM | POA: Diagnosis not present

## 2014-12-31 DIAGNOSIS — D631 Anemia in chronic kidney disease: Secondary | ICD-10-CM | POA: Diagnosis not present

## 2014-12-31 DIAGNOSIS — D509 Iron deficiency anemia, unspecified: Secondary | ICD-10-CM | POA: Diagnosis not present

## 2014-12-31 DIAGNOSIS — N2581 Secondary hyperparathyroidism of renal origin: Secondary | ICD-10-CM | POA: Diagnosis not present

## 2015-01-02 DIAGNOSIS — N2581 Secondary hyperparathyroidism of renal origin: Secondary | ICD-10-CM | POA: Diagnosis not present

## 2015-01-02 DIAGNOSIS — D509 Iron deficiency anemia, unspecified: Secondary | ICD-10-CM | POA: Diagnosis not present

## 2015-01-02 DIAGNOSIS — N186 End stage renal disease: Secondary | ICD-10-CM | POA: Diagnosis not present

## 2015-01-02 DIAGNOSIS — D631 Anemia in chronic kidney disease: Secondary | ICD-10-CM | POA: Diagnosis not present

## 2015-01-03 ENCOUNTER — Other Ambulatory Visit: Payer: Self-pay | Admitting: Family Medicine

## 2015-01-03 DIAGNOSIS — K257 Chronic gastric ulcer without hemorrhage or perforation: Secondary | ICD-10-CM

## 2015-01-03 MED ORDER — PANTOPRAZOLE SODIUM 40 MG PO TBEC: 40.0000 mg | DELAYED_RELEASE_TABLET | Freq: Two times a day (BID) | ORAL | Status: DC

## 2015-01-04 DIAGNOSIS — D631 Anemia in chronic kidney disease: Secondary | ICD-10-CM | POA: Diagnosis not present

## 2015-01-04 DIAGNOSIS — D509 Iron deficiency anemia, unspecified: Secondary | ICD-10-CM | POA: Diagnosis not present

## 2015-01-04 DIAGNOSIS — N186 End stage renal disease: Secondary | ICD-10-CM | POA: Diagnosis not present

## 2015-01-04 DIAGNOSIS — N2581 Secondary hyperparathyroidism of renal origin: Secondary | ICD-10-CM | POA: Diagnosis not present

## 2015-01-07 DIAGNOSIS — N2581 Secondary hyperparathyroidism of renal origin: Secondary | ICD-10-CM | POA: Diagnosis not present

## 2015-01-07 DIAGNOSIS — D509 Iron deficiency anemia, unspecified: Secondary | ICD-10-CM | POA: Diagnosis not present

## 2015-01-07 DIAGNOSIS — N186 End stage renal disease: Secondary | ICD-10-CM | POA: Diagnosis not present

## 2015-01-07 DIAGNOSIS — D631 Anemia in chronic kidney disease: Secondary | ICD-10-CM | POA: Diagnosis not present

## 2015-01-09 DIAGNOSIS — D509 Iron deficiency anemia, unspecified: Secondary | ICD-10-CM | POA: Diagnosis not present

## 2015-01-09 DIAGNOSIS — N186 End stage renal disease: Secondary | ICD-10-CM | POA: Diagnosis not present

## 2015-01-09 DIAGNOSIS — N2581 Secondary hyperparathyroidism of renal origin: Secondary | ICD-10-CM | POA: Diagnosis not present

## 2015-01-09 DIAGNOSIS — D631 Anemia in chronic kidney disease: Secondary | ICD-10-CM | POA: Diagnosis not present

## 2015-01-14 DIAGNOSIS — N186 End stage renal disease: Secondary | ICD-10-CM | POA: Diagnosis not present

## 2015-01-14 DIAGNOSIS — D631 Anemia in chronic kidney disease: Secondary | ICD-10-CM | POA: Diagnosis not present

## 2015-01-14 DIAGNOSIS — N2581 Secondary hyperparathyroidism of renal origin: Secondary | ICD-10-CM | POA: Diagnosis not present

## 2015-01-14 DIAGNOSIS — D509 Iron deficiency anemia, unspecified: Secondary | ICD-10-CM | POA: Diagnosis not present

## 2015-01-16 DIAGNOSIS — N2581 Secondary hyperparathyroidism of renal origin: Secondary | ICD-10-CM | POA: Diagnosis not present

## 2015-01-16 DIAGNOSIS — D631 Anemia in chronic kidney disease: Secondary | ICD-10-CM | POA: Diagnosis not present

## 2015-01-16 DIAGNOSIS — N186 End stage renal disease: Secondary | ICD-10-CM | POA: Diagnosis not present

## 2015-01-16 DIAGNOSIS — D509 Iron deficiency anemia, unspecified: Secondary | ICD-10-CM | POA: Diagnosis not present

## 2015-01-18 DIAGNOSIS — D631 Anemia in chronic kidney disease: Secondary | ICD-10-CM | POA: Diagnosis not present

## 2015-01-18 DIAGNOSIS — D509 Iron deficiency anemia, unspecified: Secondary | ICD-10-CM | POA: Diagnosis not present

## 2015-01-18 DIAGNOSIS — N2581 Secondary hyperparathyroidism of renal origin: Secondary | ICD-10-CM | POA: Diagnosis not present

## 2015-01-18 DIAGNOSIS — N186 End stage renal disease: Secondary | ICD-10-CM | POA: Diagnosis not present

## 2015-01-21 DIAGNOSIS — N186 End stage renal disease: Secondary | ICD-10-CM | POA: Diagnosis not present

## 2015-01-21 DIAGNOSIS — D631 Anemia in chronic kidney disease: Secondary | ICD-10-CM | POA: Diagnosis not present

## 2015-01-21 DIAGNOSIS — N2581 Secondary hyperparathyroidism of renal origin: Secondary | ICD-10-CM | POA: Diagnosis not present

## 2015-01-21 DIAGNOSIS — D509 Iron deficiency anemia, unspecified: Secondary | ICD-10-CM | POA: Diagnosis not present

## 2015-01-23 DIAGNOSIS — N2581 Secondary hyperparathyroidism of renal origin: Secondary | ICD-10-CM | POA: Diagnosis not present

## 2015-01-23 DIAGNOSIS — D631 Anemia in chronic kidney disease: Secondary | ICD-10-CM | POA: Diagnosis not present

## 2015-01-23 DIAGNOSIS — D509 Iron deficiency anemia, unspecified: Secondary | ICD-10-CM | POA: Diagnosis not present

## 2015-01-23 DIAGNOSIS — N186 End stage renal disease: Secondary | ICD-10-CM | POA: Diagnosis not present

## 2015-01-28 DIAGNOSIS — D509 Iron deficiency anemia, unspecified: Secondary | ICD-10-CM | POA: Diagnosis not present

## 2015-01-28 DIAGNOSIS — N186 End stage renal disease: Secondary | ICD-10-CM | POA: Diagnosis not present

## 2015-01-28 DIAGNOSIS — N2581 Secondary hyperparathyroidism of renal origin: Secondary | ICD-10-CM | POA: Diagnosis not present

## 2015-01-28 DIAGNOSIS — D631 Anemia in chronic kidney disease: Secondary | ICD-10-CM | POA: Diagnosis not present

## 2015-01-29 DIAGNOSIS — N186 End stage renal disease: Secondary | ICD-10-CM | POA: Diagnosis not present

## 2015-01-29 DIAGNOSIS — I120 Hypertensive chronic kidney disease with stage 5 chronic kidney disease or end stage renal disease: Secondary | ICD-10-CM | POA: Diagnosis not present

## 2015-01-29 DIAGNOSIS — Z992 Dependence on renal dialysis: Secondary | ICD-10-CM | POA: Diagnosis not present

## 2015-01-30 DIAGNOSIS — Z23 Encounter for immunization: Secondary | ICD-10-CM | POA: Diagnosis not present

## 2015-01-30 DIAGNOSIS — N186 End stage renal disease: Secondary | ICD-10-CM | POA: Diagnosis not present

## 2015-01-30 DIAGNOSIS — D631 Anemia in chronic kidney disease: Secondary | ICD-10-CM | POA: Diagnosis not present

## 2015-01-30 DIAGNOSIS — N2581 Secondary hyperparathyroidism of renal origin: Secondary | ICD-10-CM | POA: Diagnosis not present

## 2015-02-01 DIAGNOSIS — Z23 Encounter for immunization: Secondary | ICD-10-CM | POA: Diagnosis not present

## 2015-02-01 DIAGNOSIS — N186 End stage renal disease: Secondary | ICD-10-CM | POA: Diagnosis not present

## 2015-02-01 DIAGNOSIS — D631 Anemia in chronic kidney disease: Secondary | ICD-10-CM | POA: Diagnosis not present

## 2015-02-01 DIAGNOSIS — N2581 Secondary hyperparathyroidism of renal origin: Secondary | ICD-10-CM | POA: Diagnosis not present

## 2015-02-04 DIAGNOSIS — N2581 Secondary hyperparathyroidism of renal origin: Secondary | ICD-10-CM | POA: Diagnosis not present

## 2015-02-04 DIAGNOSIS — D631 Anemia in chronic kidney disease: Secondary | ICD-10-CM | POA: Diagnosis not present

## 2015-02-04 DIAGNOSIS — Z23 Encounter for immunization: Secondary | ICD-10-CM | POA: Diagnosis not present

## 2015-02-04 DIAGNOSIS — N186 End stage renal disease: Secondary | ICD-10-CM | POA: Diagnosis not present

## 2015-02-08 DIAGNOSIS — D631 Anemia in chronic kidney disease: Secondary | ICD-10-CM | POA: Diagnosis not present

## 2015-02-08 DIAGNOSIS — Z23 Encounter for immunization: Secondary | ICD-10-CM | POA: Diagnosis not present

## 2015-02-08 DIAGNOSIS — N2581 Secondary hyperparathyroidism of renal origin: Secondary | ICD-10-CM | POA: Diagnosis not present

## 2015-02-08 DIAGNOSIS — N186 End stage renal disease: Secondary | ICD-10-CM | POA: Diagnosis not present

## 2015-02-11 DIAGNOSIS — Z23 Encounter for immunization: Secondary | ICD-10-CM | POA: Diagnosis not present

## 2015-02-11 DIAGNOSIS — N2581 Secondary hyperparathyroidism of renal origin: Secondary | ICD-10-CM | POA: Diagnosis not present

## 2015-02-11 DIAGNOSIS — D631 Anemia in chronic kidney disease: Secondary | ICD-10-CM | POA: Diagnosis not present

## 2015-02-11 DIAGNOSIS — N186 End stage renal disease: Secondary | ICD-10-CM | POA: Diagnosis not present

## 2015-02-13 DIAGNOSIS — N2581 Secondary hyperparathyroidism of renal origin: Secondary | ICD-10-CM | POA: Diagnosis not present

## 2015-02-13 DIAGNOSIS — Z23 Encounter for immunization: Secondary | ICD-10-CM | POA: Diagnosis not present

## 2015-02-13 DIAGNOSIS — D631 Anemia in chronic kidney disease: Secondary | ICD-10-CM | POA: Diagnosis not present

## 2015-02-13 DIAGNOSIS — N186 End stage renal disease: Secondary | ICD-10-CM | POA: Diagnosis not present

## 2015-02-18 DIAGNOSIS — Z23 Encounter for immunization: Secondary | ICD-10-CM | POA: Diagnosis not present

## 2015-02-18 DIAGNOSIS — N2581 Secondary hyperparathyroidism of renal origin: Secondary | ICD-10-CM | POA: Diagnosis not present

## 2015-02-18 DIAGNOSIS — N186 End stage renal disease: Secondary | ICD-10-CM | POA: Diagnosis not present

## 2015-02-18 DIAGNOSIS — D631 Anemia in chronic kidney disease: Secondary | ICD-10-CM | POA: Diagnosis not present

## 2015-02-22 DIAGNOSIS — Z23 Encounter for immunization: Secondary | ICD-10-CM | POA: Diagnosis not present

## 2015-02-22 DIAGNOSIS — D631 Anemia in chronic kidney disease: Secondary | ICD-10-CM | POA: Diagnosis not present

## 2015-02-22 DIAGNOSIS — N186 End stage renal disease: Secondary | ICD-10-CM | POA: Diagnosis not present

## 2015-02-22 DIAGNOSIS — N2581 Secondary hyperparathyroidism of renal origin: Secondary | ICD-10-CM | POA: Diagnosis not present

## 2015-02-25 DIAGNOSIS — Z23 Encounter for immunization: Secondary | ICD-10-CM | POA: Diagnosis not present

## 2015-02-25 DIAGNOSIS — D631 Anemia in chronic kidney disease: Secondary | ICD-10-CM | POA: Diagnosis not present

## 2015-02-25 DIAGNOSIS — N186 End stage renal disease: Secondary | ICD-10-CM | POA: Diagnosis not present

## 2015-02-25 DIAGNOSIS — N2581 Secondary hyperparathyroidism of renal origin: Secondary | ICD-10-CM | POA: Diagnosis not present

## 2015-02-27 ENCOUNTER — Emergency Department (HOSPITAL_COMMUNITY)
Admission: EM | Admit: 2015-02-27 | Discharge: 2015-02-27 | Disposition: A | Discharge status: Home / Self Care | Payer: Medicare Other | Attending: Emergency Medicine | Admitting: Emergency Medicine

## 2015-02-27 DIAGNOSIS — F410 Panic disorder [episodic paroxysmal anxiety] without agoraphobia: Secondary | ICD-10-CM | POA: Diagnosis not present

## 2015-02-27 DIAGNOSIS — I129 Hypertensive chronic kidney disease with stage 1 through stage 4 chronic kidney disease, or unspecified chronic kidney disease: Secondary | ICD-10-CM | POA: Insufficient documentation

## 2015-02-27 DIAGNOSIS — N186 End stage renal disease: Secondary | ICD-10-CM | POA: Diagnosis not present

## 2015-02-27 DIAGNOSIS — Z79899 Other long term (current) drug therapy: Secondary | ICD-10-CM | POA: Insufficient documentation

## 2015-02-27 DIAGNOSIS — Z720 Tobacco use: Secondary | ICD-10-CM | POA: Diagnosis not present

## 2015-02-27 DIAGNOSIS — I209 Angina pectoris, unspecified: Secondary | ICD-10-CM | POA: Diagnosis not present

## 2015-02-27 DIAGNOSIS — D631 Anemia in chronic kidney disease: Secondary | ICD-10-CM | POA: Diagnosis not present

## 2015-02-27 DIAGNOSIS — N189 Chronic kidney disease, unspecified: Secondary | ICD-10-CM | POA: Insufficient documentation

## 2015-02-27 DIAGNOSIS — N2581 Secondary hyperparathyroidism of renal origin: Secondary | ICD-10-CM | POA: Diagnosis not present

## 2015-02-27 DIAGNOSIS — S59919A Unspecified injury of unspecified forearm, initial encounter: Secondary | ICD-10-CM | POA: Diagnosis not present

## 2015-02-27 DIAGNOSIS — J45909 Unspecified asthma, uncomplicated: Secondary | ICD-10-CM | POA: Diagnosis not present

## 2015-02-27 DIAGNOSIS — Z23 Encounter for immunization: Secondary | ICD-10-CM | POA: Diagnosis not present

## 2015-02-27 NOTE — Discharge Instructions (Signed)

## 2015-02-27 NOTE — ED Notes (Signed)
Patient states that she had a panic attack this PM. States that she panicked when her graft began bleeding.  States that it was bleeding a lot. States that she put pressure on it and the bleeding stopped.

## 2015-02-27 NOTE — ED Provider Notes (Signed)
CSN: [id number]     Arrival date & time 02/27/15  1815 History   First MD Initiated Contact with Patient 02/27/15 1816     Chief Complaint  Patient presents with  . Panic Attack     (Consider location/radiation/quality/duration/timing/severity/associated sxs/prior Treatment) HPI Comments: 54 year old female with a past medical history of asthma, CKD on dialysis (Tu, Th, Sa), hep C and HTN presenting via EMS from home after having a panic attack when returning home from dialysis today around 4PM. Pt completed her dialysis and states she "didn't apply enough pressure", and when she got home her fistula started to bleed which has never happened in the past causing her to panic. States she got "scared and scared and scared I panicked" and developed rapid breathing and tightness in her chest until EMS was able to control the bleeding with pressure after a few minutes. Currently denies any symptoms. Denies CP, SOB, n/v, fever, arm pain.  The history is provided by the patient and the EMS personnel.    Past Medical History  Diagnosis Date  . Asthma   . Hepatitis C antibody test positive   . Hypertension     "just dx'd today" (11/03/2012)  . Anginal pain   . Chronic kidney disease     dialysis 3x wk   Past Surgical History  Procedure Laterality Date  . Abdominal hysterectomy      "partial" (11/03/2012)  . Esophagogastroduodenoscopy N/A 06/05/2013    Procedure: ESOPHAGOGASTRODUODENOSCOPY (EGD);  Surgeon: Winfield Cunas., MD;  Location: [facility];  Service: Endoscopy;  Laterality: N/A;  . Av fistula placement Right 12/03/2013    Procedure: CREATION OF ARTERIOVENOUS (AV) FISTULA RIGHT ARM ;  Surgeon: Rosetta Posner, MD;  Location: [facility];  Service: Vascular;  Laterality: Right;  . Bascilic vein transposition Right 03/06/2014    Procedure: SECOND STAGE BASILIC VEIN TRANSPOSITION;  Surgeon: Rosetta Posner, MD;  Location: [facility];  Service: Vascular;  Laterality: Right;   Family History  Problem  Relation Age of Onset  . Lupus Mother   . Diabetes Father   . Heart attack Father    Social History  Substance Use Topics  . Smoking status: Light Tobacco Smoker -- 0.50 packs/day for 20 years    Types: Cigarettes  . Smokeless tobacco: Never Used  . Alcohol Use: 27.6 oz/week    46 Cans of beer per week     Comment: 11/03/2012 "1-2 40oz beers/day maybe"   OB History    No data available     Review of Systems  Respiratory: Positive for chest tightness (subsided).   Skin:       + Bleeding fistula, resolved.  Psychiatric/Behavioral: The patient is nervous/anxious.   All other systems reviewed and are negative.     Allergies  Cefazolin  Home Medications   Prior to Admission medications   Medication Sig Start Date End Date Taking? Authorizing Provider  amLODipine (NORVASC) 2.5 MG tablet Take 1 tablet (2.5 mg total) by mouth daily. 12/11/14   Boykin Nearing, MD  hydrOXYzine (VISTARIL) 25 MG capsule Take 1 capsule (25 mg total) by mouth 3 (three) times daily as needed for itching. 12/11/14   Josalyn Funches, MD  metoprolol (LOPRESSOR) 50 MG tablet Take 1 tablet (50 mg total) by mouth 2 (two) times daily. 10/30/14   Josalyn Funches, MD  pantoprazole (PROTONIX) 40 MG tablet Take 1 tablet (40 mg total) by mouth 2 (two) times daily. 01/03/15   Boykin Nearing, MD  sevelamer carbonate (  RENVELA) 800 MG tablet Take 3 tablets (2,400 mg total) by mouth 3 (three) times daily with meals. 10/30/14   Josalyn Funches, MD   BP 94/64 mmHg  Pulse 90  Temp(Src) 98.2 F (36.8 C) (Oral)  Resp 18  SpO2 100% Physical Exam  Constitutional: She is oriented to person, place, and time. She appears well-developed and well-nourished. No distress.  HENT:  Head: Normocephalic and atraumatic.  Mouth/Throat: Oropharynx is clear and moist.  Eyes: Conjunctivae and EOM are normal. Pupils are equal, round, and reactive to light.  Neck: Normal range of motion. Neck supple. No JVD present.  Cardiovascular: Normal  rate, regular rhythm, normal heart sounds and intact distal pulses.   No extremity edema.  Pulmonary/Chest: Effort normal and breath sounds normal. No respiratory distress.  Abdominal: Soft. Bowel sounds are normal. There is no tenderness.  Musculoskeletal: Normal range of motion. She exhibits no edema.  Neurological: She is alert and oriented to person, place, and time. She has normal strength. No sensory deficit.  Speech fluent, goal oriented. Moves extremities without ataxia. Equal grip strength bilateral.  Skin: Skin is warm and dry. She is not diaphoretic.  Fistula in RUE with palpable thrill. No erythema, edema, warmth, ecchymosis or bleeding.  Psychiatric: She has a normal mood and affect. Her behavior is normal.  Nursing note and vitals reviewed.   ED Course  Procedures (including critical care time) Labs Review Labs Reviewed - No data to display  Imaging Review No results found. I have personally reviewed and evaluated these images and lab results as part of my medical decision-making.   EKG Interpretation None      MDM   Final diagnoses:  Panic attack   Non-toxic appearing, NAD. AFVSS. Asymptomatic. No CP. Had panic attack due to bleeding fistula which is not currently bleeding. No signs of infection. I do not feel workup is necessary at this time given lack of symptoms. EKG without acute changes. Stable for d/c. Return precautions given. Patient states understanding of treatment care plan and is agreeable.  Discussed with attending Dr. Darl Householder who agrees with plan of care.  Carman Ching, PA-C 02/27/15 1849  Wandra Arthurs, MD 02/27/15 2224

## 2015-02-27 NOTE — ED Notes (Signed)
Per EMS - patient C/O chest tightness that began at 4 pm today. States that patient had a panic attach after HD when her fistula began bleeding.

## 2015-02-28 DIAGNOSIS — Z992 Dependence on renal dialysis: Secondary | ICD-10-CM | POA: Diagnosis not present

## 2015-02-28 DIAGNOSIS — N186 End stage renal disease: Secondary | ICD-10-CM | POA: Diagnosis not present

## 2015-02-28 DIAGNOSIS — I120 Hypertensive chronic kidney disease with stage 5 chronic kidney disease or end stage renal disease: Secondary | ICD-10-CM | POA: Diagnosis not present

## 2015-03-04 DIAGNOSIS — N186 End stage renal disease: Secondary | ICD-10-CM | POA: Diagnosis not present

## 2015-03-04 DIAGNOSIS — D509 Iron deficiency anemia, unspecified: Secondary | ICD-10-CM | POA: Diagnosis not present

## 2015-03-04 DIAGNOSIS — D631 Anemia in chronic kidney disease: Secondary | ICD-10-CM | POA: Diagnosis not present

## 2015-03-04 DIAGNOSIS — N2581 Secondary hyperparathyroidism of renal origin: Secondary | ICD-10-CM | POA: Diagnosis not present

## 2015-03-04 DIAGNOSIS — K7460 Unspecified cirrhosis of liver: Secondary | ICD-10-CM | POA: Diagnosis not present

## 2015-03-06 DIAGNOSIS — K7460 Unspecified cirrhosis of liver: Secondary | ICD-10-CM | POA: Diagnosis not present

## 2015-03-06 DIAGNOSIS — N186 End stage renal disease: Secondary | ICD-10-CM | POA: Diagnosis not present

## 2015-03-06 DIAGNOSIS — N2581 Secondary hyperparathyroidism of renal origin: Secondary | ICD-10-CM | POA: Diagnosis not present

## 2015-03-06 DIAGNOSIS — D509 Iron deficiency anemia, unspecified: Secondary | ICD-10-CM | POA: Diagnosis not present

## 2015-03-06 DIAGNOSIS — D631 Anemia in chronic kidney disease: Secondary | ICD-10-CM | POA: Diagnosis not present

## 2015-03-08 DIAGNOSIS — N186 End stage renal disease: Secondary | ICD-10-CM | POA: Diagnosis not present

## 2015-03-08 DIAGNOSIS — K7460 Unspecified cirrhosis of liver: Secondary | ICD-10-CM | POA: Diagnosis not present

## 2015-03-08 DIAGNOSIS — D631 Anemia in chronic kidney disease: Secondary | ICD-10-CM | POA: Diagnosis not present

## 2015-03-08 DIAGNOSIS — N2581 Secondary hyperparathyroidism of renal origin: Secondary | ICD-10-CM | POA: Diagnosis not present

## 2015-03-08 DIAGNOSIS — D509 Iron deficiency anemia, unspecified: Secondary | ICD-10-CM | POA: Diagnosis not present

## 2015-03-11 DIAGNOSIS — N2581 Secondary hyperparathyroidism of renal origin: Secondary | ICD-10-CM | POA: Diagnosis not present

## 2015-03-11 DIAGNOSIS — D509 Iron deficiency anemia, unspecified: Secondary | ICD-10-CM | POA: Diagnosis not present

## 2015-03-11 DIAGNOSIS — D631 Anemia in chronic kidney disease: Secondary | ICD-10-CM | POA: Diagnosis not present

## 2015-03-11 DIAGNOSIS — N186 End stage renal disease: Secondary | ICD-10-CM | POA: Diagnosis not present

## 2015-03-11 DIAGNOSIS — K7460 Unspecified cirrhosis of liver: Secondary | ICD-10-CM | POA: Diagnosis not present

## 2015-03-13 DIAGNOSIS — D631 Anemia in chronic kidney disease: Secondary | ICD-10-CM | POA: Diagnosis not present

## 2015-03-13 DIAGNOSIS — N2581 Secondary hyperparathyroidism of renal origin: Secondary | ICD-10-CM | POA: Diagnosis not present

## 2015-03-13 DIAGNOSIS — D509 Iron deficiency anemia, unspecified: Secondary | ICD-10-CM | POA: Diagnosis not present

## 2015-03-13 DIAGNOSIS — N186 End stage renal disease: Secondary | ICD-10-CM | POA: Diagnosis not present

## 2015-03-13 DIAGNOSIS — K7460 Unspecified cirrhosis of liver: Secondary | ICD-10-CM | POA: Diagnosis not present

## 2015-03-14 DIAGNOSIS — H40033 Anatomical narrow angle, bilateral: Secondary | ICD-10-CM | POA: Diagnosis not present

## 2015-03-14 DIAGNOSIS — H10413 Chronic giant papillary conjunctivitis, bilateral: Secondary | ICD-10-CM | POA: Diagnosis not present

## 2015-03-14 DIAGNOSIS — H0011 Chalazion right upper eyelid: Secondary | ICD-10-CM | POA: Diagnosis not present

## 2015-03-14 DIAGNOSIS — H04123 Dry eye syndrome of bilateral lacrimal glands: Secondary | ICD-10-CM | POA: Diagnosis not present

## 2015-03-14 DIAGNOSIS — H00024 Hordeolum internum left upper eyelid: Secondary | ICD-10-CM | POA: Diagnosis not present

## 2015-03-18 DIAGNOSIS — K7460 Unspecified cirrhosis of liver: Secondary | ICD-10-CM | POA: Diagnosis not present

## 2015-03-18 DIAGNOSIS — N2581 Secondary hyperparathyroidism of renal origin: Secondary | ICD-10-CM | POA: Diagnosis not present

## 2015-03-18 DIAGNOSIS — N186 End stage renal disease: Secondary | ICD-10-CM | POA: Diagnosis not present

## 2015-03-18 DIAGNOSIS — D509 Iron deficiency anemia, unspecified: Secondary | ICD-10-CM | POA: Diagnosis not present

## 2015-03-18 DIAGNOSIS — D631 Anemia in chronic kidney disease: Secondary | ICD-10-CM | POA: Diagnosis not present

## 2015-03-20 DIAGNOSIS — D631 Anemia in chronic kidney disease: Secondary | ICD-10-CM | POA: Diagnosis not present

## 2015-03-20 DIAGNOSIS — D509 Iron deficiency anemia, unspecified: Secondary | ICD-10-CM | POA: Diagnosis not present

## 2015-03-20 DIAGNOSIS — N186 End stage renal disease: Secondary | ICD-10-CM | POA: Diagnosis not present

## 2015-03-20 DIAGNOSIS — K7460 Unspecified cirrhosis of liver: Secondary | ICD-10-CM | POA: Diagnosis not present

## 2015-03-20 DIAGNOSIS — N2581 Secondary hyperparathyroidism of renal origin: Secondary | ICD-10-CM | POA: Diagnosis not present

## 2015-03-22 DIAGNOSIS — N2581 Secondary hyperparathyroidism of renal origin: Secondary | ICD-10-CM | POA: Diagnosis not present

## 2015-03-22 DIAGNOSIS — D509 Iron deficiency anemia, unspecified: Secondary | ICD-10-CM | POA: Diagnosis not present

## 2015-03-22 DIAGNOSIS — N186 End stage renal disease: Secondary | ICD-10-CM | POA: Diagnosis not present

## 2015-03-22 DIAGNOSIS — D631 Anemia in chronic kidney disease: Secondary | ICD-10-CM | POA: Diagnosis not present

## 2015-03-22 DIAGNOSIS — K7460 Unspecified cirrhosis of liver: Secondary | ICD-10-CM | POA: Diagnosis not present

## 2015-03-25 DIAGNOSIS — N186 End stage renal disease: Secondary | ICD-10-CM | POA: Diagnosis not present

## 2015-03-25 DIAGNOSIS — D509 Iron deficiency anemia, unspecified: Secondary | ICD-10-CM | POA: Diagnosis not present

## 2015-03-25 DIAGNOSIS — N2581 Secondary hyperparathyroidism of renal origin: Secondary | ICD-10-CM | POA: Diagnosis not present

## 2015-03-25 DIAGNOSIS — D631 Anemia in chronic kidney disease: Secondary | ICD-10-CM | POA: Diagnosis not present

## 2015-03-25 DIAGNOSIS — K7460 Unspecified cirrhosis of liver: Secondary | ICD-10-CM | POA: Diagnosis not present

## 2015-03-29 DIAGNOSIS — N186 End stage renal disease: Secondary | ICD-10-CM | POA: Diagnosis not present

## 2015-03-29 DIAGNOSIS — D631 Anemia in chronic kidney disease: Secondary | ICD-10-CM | POA: Diagnosis not present

## 2015-03-29 DIAGNOSIS — N2581 Secondary hyperparathyroidism of renal origin: Secondary | ICD-10-CM | POA: Diagnosis not present

## 2015-03-29 DIAGNOSIS — K7460 Unspecified cirrhosis of liver: Secondary | ICD-10-CM | POA: Diagnosis not present

## 2015-03-29 DIAGNOSIS — D509 Iron deficiency anemia, unspecified: Secondary | ICD-10-CM | POA: Diagnosis not present

## 2015-03-31 DIAGNOSIS — Z992 Dependence on renal dialysis: Secondary | ICD-10-CM | POA: Diagnosis not present

## 2015-03-31 DIAGNOSIS — N186 End stage renal disease: Secondary | ICD-10-CM | POA: Diagnosis not present

## 2015-03-31 DIAGNOSIS — I120 Hypertensive chronic kidney disease with stage 5 chronic kidney disease or end stage renal disease: Secondary | ICD-10-CM | POA: Diagnosis not present

## 2015-04-01 DIAGNOSIS — D509 Iron deficiency anemia, unspecified: Secondary | ICD-10-CM | POA: Diagnosis not present

## 2015-04-01 DIAGNOSIS — D631 Anemia in chronic kidney disease: Secondary | ICD-10-CM | POA: Diagnosis not present

## 2015-04-01 DIAGNOSIS — N186 End stage renal disease: Secondary | ICD-10-CM | POA: Diagnosis not present

## 2015-04-01 DIAGNOSIS — N2581 Secondary hyperparathyroidism of renal origin: Secondary | ICD-10-CM | POA: Diagnosis not present

## 2015-04-05 DIAGNOSIS — N2581 Secondary hyperparathyroidism of renal origin: Secondary | ICD-10-CM | POA: Diagnosis not present

## 2015-04-05 DIAGNOSIS — D509 Iron deficiency anemia, unspecified: Secondary | ICD-10-CM | POA: Diagnosis not present

## 2015-04-05 DIAGNOSIS — D631 Anemia in chronic kidney disease: Secondary | ICD-10-CM | POA: Diagnosis not present

## 2015-04-05 DIAGNOSIS — N186 End stage renal disease: Secondary | ICD-10-CM | POA: Diagnosis not present

## 2015-04-08 DIAGNOSIS — N2581 Secondary hyperparathyroidism of renal origin: Secondary | ICD-10-CM | POA: Diagnosis not present

## 2015-04-08 DIAGNOSIS — D509 Iron deficiency anemia, unspecified: Secondary | ICD-10-CM | POA: Diagnosis not present

## 2015-04-08 DIAGNOSIS — D631 Anemia in chronic kidney disease: Secondary | ICD-10-CM | POA: Diagnosis not present

## 2015-04-08 DIAGNOSIS — N186 End stage renal disease: Secondary | ICD-10-CM | POA: Diagnosis not present

## 2015-04-10 DIAGNOSIS — D509 Iron deficiency anemia, unspecified: Secondary | ICD-10-CM | POA: Diagnosis not present

## 2015-04-10 DIAGNOSIS — N2581 Secondary hyperparathyroidism of renal origin: Secondary | ICD-10-CM | POA: Diagnosis not present

## 2015-04-10 DIAGNOSIS — D631 Anemia in chronic kidney disease: Secondary | ICD-10-CM | POA: Diagnosis not present

## 2015-04-10 DIAGNOSIS — N186 End stage renal disease: Secondary | ICD-10-CM | POA: Diagnosis not present

## 2015-04-15 DIAGNOSIS — D631 Anemia in chronic kidney disease: Secondary | ICD-10-CM | POA: Diagnosis not present

## 2015-04-15 DIAGNOSIS — N2581 Secondary hyperparathyroidism of renal origin: Secondary | ICD-10-CM | POA: Diagnosis not present

## 2015-04-15 DIAGNOSIS — D509 Iron deficiency anemia, unspecified: Secondary | ICD-10-CM | POA: Diagnosis not present

## 2015-04-15 DIAGNOSIS — N186 End stage renal disease: Secondary | ICD-10-CM | POA: Diagnosis not present

## 2015-04-17 DIAGNOSIS — D509 Iron deficiency anemia, unspecified: Secondary | ICD-10-CM | POA: Diagnosis not present

## 2015-04-17 DIAGNOSIS — D631 Anemia in chronic kidney disease: Secondary | ICD-10-CM | POA: Diagnosis not present

## 2015-04-17 DIAGNOSIS — N2581 Secondary hyperparathyroidism of renal origin: Secondary | ICD-10-CM | POA: Diagnosis not present

## 2015-04-17 DIAGNOSIS — N186 End stage renal disease: Secondary | ICD-10-CM | POA: Diagnosis not present

## 2015-04-19 DIAGNOSIS — N186 End stage renal disease: Secondary | ICD-10-CM | POA: Diagnosis not present

## 2015-04-19 DIAGNOSIS — N2581 Secondary hyperparathyroidism of renal origin: Secondary | ICD-10-CM | POA: Diagnosis not present

## 2015-04-19 DIAGNOSIS — D509 Iron deficiency anemia, unspecified: Secondary | ICD-10-CM | POA: Diagnosis not present

## 2015-04-19 DIAGNOSIS — D631 Anemia in chronic kidney disease: Secondary | ICD-10-CM | POA: Diagnosis not present

## 2015-04-22 DIAGNOSIS — D509 Iron deficiency anemia, unspecified: Secondary | ICD-10-CM | POA: Diagnosis not present

## 2015-04-22 DIAGNOSIS — N2581 Secondary hyperparathyroidism of renal origin: Secondary | ICD-10-CM | POA: Diagnosis not present

## 2015-04-22 DIAGNOSIS — D631 Anemia in chronic kidney disease: Secondary | ICD-10-CM | POA: Diagnosis not present

## 2015-04-22 DIAGNOSIS — N186 End stage renal disease: Secondary | ICD-10-CM | POA: Diagnosis not present

## 2015-04-25 DIAGNOSIS — N186 End stage renal disease: Secondary | ICD-10-CM | POA: Diagnosis not present

## 2015-04-25 DIAGNOSIS — D631 Anemia in chronic kidney disease: Secondary | ICD-10-CM | POA: Diagnosis not present

## 2015-04-25 DIAGNOSIS — N2581 Secondary hyperparathyroidism of renal origin: Secondary | ICD-10-CM | POA: Diagnosis not present

## 2015-04-25 DIAGNOSIS — D509 Iron deficiency anemia, unspecified: Secondary | ICD-10-CM | POA: Diagnosis not present

## 2015-04-29 DIAGNOSIS — D631 Anemia in chronic kidney disease: Secondary | ICD-10-CM | POA: Diagnosis not present

## 2015-04-29 DIAGNOSIS — N186 End stage renal disease: Secondary | ICD-10-CM | POA: Diagnosis not present

## 2015-04-29 DIAGNOSIS — D509 Iron deficiency anemia, unspecified: Secondary | ICD-10-CM | POA: Diagnosis not present

## 2015-04-29 DIAGNOSIS — N2581 Secondary hyperparathyroidism of renal origin: Secondary | ICD-10-CM | POA: Diagnosis not present

## 2015-04-30 DIAGNOSIS — N186 End stage renal disease: Secondary | ICD-10-CM | POA: Diagnosis not present

## 2015-04-30 DIAGNOSIS — Z992 Dependence on renal dialysis: Secondary | ICD-10-CM | POA: Diagnosis not present

## 2015-04-30 DIAGNOSIS — I120 Hypertensive chronic kidney disease with stage 5 chronic kidney disease or end stage renal disease: Secondary | ICD-10-CM | POA: Diagnosis not present

## 2015-05-01 DIAGNOSIS — D631 Anemia in chronic kidney disease: Secondary | ICD-10-CM | POA: Diagnosis not present

## 2015-05-01 DIAGNOSIS — N186 End stage renal disease: Secondary | ICD-10-CM | POA: Diagnosis not present

## 2015-05-01 DIAGNOSIS — N2581 Secondary hyperparathyroidism of renal origin: Secondary | ICD-10-CM | POA: Diagnosis not present

## 2015-05-06 DIAGNOSIS — D631 Anemia in chronic kidney disease: Secondary | ICD-10-CM | POA: Diagnosis not present

## 2015-05-06 DIAGNOSIS — N2581 Secondary hyperparathyroidism of renal origin: Secondary | ICD-10-CM | POA: Diagnosis not present

## 2015-05-06 DIAGNOSIS — N186 End stage renal disease: Secondary | ICD-10-CM | POA: Diagnosis not present

## 2015-05-08 DIAGNOSIS — N2581 Secondary hyperparathyroidism of renal origin: Secondary | ICD-10-CM | POA: Diagnosis not present

## 2015-05-08 DIAGNOSIS — D631 Anemia in chronic kidney disease: Secondary | ICD-10-CM | POA: Diagnosis not present

## 2015-05-08 DIAGNOSIS — N186 End stage renal disease: Secondary | ICD-10-CM | POA: Diagnosis not present

## 2015-05-13 DIAGNOSIS — N186 End stage renal disease: Secondary | ICD-10-CM | POA: Diagnosis not present

## 2015-05-13 DIAGNOSIS — D631 Anemia in chronic kidney disease: Secondary | ICD-10-CM | POA: Diagnosis not present

## 2015-05-13 DIAGNOSIS — N2581 Secondary hyperparathyroidism of renal origin: Secondary | ICD-10-CM | POA: Diagnosis not present

## 2015-05-15 DIAGNOSIS — N186 End stage renal disease: Secondary | ICD-10-CM | POA: Diagnosis not present

## 2015-05-15 DIAGNOSIS — D631 Anemia in chronic kidney disease: Secondary | ICD-10-CM | POA: Diagnosis not present

## 2015-05-15 DIAGNOSIS — N2581 Secondary hyperparathyroidism of renal origin: Secondary | ICD-10-CM | POA: Diagnosis not present

## 2015-05-17 DIAGNOSIS — D631 Anemia in chronic kidney disease: Secondary | ICD-10-CM | POA: Diagnosis not present

## 2015-05-17 DIAGNOSIS — N2581 Secondary hyperparathyroidism of renal origin: Secondary | ICD-10-CM | POA: Diagnosis not present

## 2015-05-17 DIAGNOSIS — N186 End stage renal disease: Secondary | ICD-10-CM | POA: Diagnosis not present

## 2015-05-20 DIAGNOSIS — N2581 Secondary hyperparathyroidism of renal origin: Secondary | ICD-10-CM | POA: Diagnosis not present

## 2015-05-20 DIAGNOSIS — N186 End stage renal disease: Secondary | ICD-10-CM | POA: Diagnosis not present

## 2015-05-20 DIAGNOSIS — D631 Anemia in chronic kidney disease: Secondary | ICD-10-CM | POA: Diagnosis not present

## 2015-05-22 DIAGNOSIS — N186 End stage renal disease: Secondary | ICD-10-CM | POA: Diagnosis not present

## 2015-05-22 DIAGNOSIS — N2581 Secondary hyperparathyroidism of renal origin: Secondary | ICD-10-CM | POA: Diagnosis not present

## 2015-05-22 DIAGNOSIS — D631 Anemia in chronic kidney disease: Secondary | ICD-10-CM | POA: Diagnosis not present

## 2015-05-23 DIAGNOSIS — L720 Epidermal cyst: Secondary | ICD-10-CM | POA: Diagnosis not present

## 2015-05-23 DIAGNOSIS — D485 Neoplasm of uncertain behavior of skin: Secondary | ICD-10-CM | POA: Diagnosis not present

## 2015-05-27 DIAGNOSIS — D631 Anemia in chronic kidney disease: Secondary | ICD-10-CM | POA: Diagnosis not present

## 2015-05-27 DIAGNOSIS — N186 End stage renal disease: Secondary | ICD-10-CM | POA: Diagnosis not present

## 2015-05-27 DIAGNOSIS — N2581 Secondary hyperparathyroidism of renal origin: Secondary | ICD-10-CM | POA: Diagnosis not present

## 2015-05-29 DIAGNOSIS — N186 End stage renal disease: Secondary | ICD-10-CM | POA: Diagnosis not present

## 2015-05-29 DIAGNOSIS — D631 Anemia in chronic kidney disease: Secondary | ICD-10-CM | POA: Diagnosis not present

## 2015-05-29 DIAGNOSIS — N2581 Secondary hyperparathyroidism of renal origin: Secondary | ICD-10-CM | POA: Diagnosis not present

## 2015-05-31 DIAGNOSIS — D631 Anemia in chronic kidney disease: Secondary | ICD-10-CM | POA: Diagnosis not present

## 2015-05-31 DIAGNOSIS — N2581 Secondary hyperparathyroidism of renal origin: Secondary | ICD-10-CM | POA: Diagnosis not present

## 2015-05-31 DIAGNOSIS — I120 Hypertensive chronic kidney disease with stage 5 chronic kidney disease or end stage renal disease: Secondary | ICD-10-CM | POA: Diagnosis not present

## 2015-05-31 DIAGNOSIS — Z992 Dependence on renal dialysis: Secondary | ICD-10-CM | POA: Diagnosis not present

## 2015-05-31 DIAGNOSIS — N186 End stage renal disease: Secondary | ICD-10-CM | POA: Diagnosis not present

## 2015-06-03 MED FILL — PANTOPRAZOLE SOD DR 40 MG T: 40 | 30 days supply | Qty: 60 | Fill #3

## 2015-06-05 DIAGNOSIS — D509 Iron deficiency anemia, unspecified: Secondary | ICD-10-CM | POA: Diagnosis not present

## 2015-06-05 DIAGNOSIS — N2581 Secondary hyperparathyroidism of renal origin: Secondary | ICD-10-CM | POA: Diagnosis not present

## 2015-06-05 DIAGNOSIS — D631 Anemia in chronic kidney disease: Secondary | ICD-10-CM | POA: Diagnosis not present

## 2015-06-05 DIAGNOSIS — K7460 Unspecified cirrhosis of liver: Secondary | ICD-10-CM | POA: Diagnosis not present

## 2015-06-05 DIAGNOSIS — N186 End stage renal disease: Secondary | ICD-10-CM | POA: Diagnosis not present

## 2015-06-10 DIAGNOSIS — N186 End stage renal disease: Secondary | ICD-10-CM | POA: Diagnosis not present

## 2015-06-10 DIAGNOSIS — K7460 Unspecified cirrhosis of liver: Secondary | ICD-10-CM | POA: Diagnosis not present

## 2015-06-10 DIAGNOSIS — N2581 Secondary hyperparathyroidism of renal origin: Secondary | ICD-10-CM | POA: Diagnosis not present

## 2015-06-10 DIAGNOSIS — D509 Iron deficiency anemia, unspecified: Secondary | ICD-10-CM | POA: Diagnosis not present

## 2015-06-10 DIAGNOSIS — D631 Anemia in chronic kidney disease: Secondary | ICD-10-CM | POA: Diagnosis not present

## 2015-06-12 DIAGNOSIS — N186 End stage renal disease: Secondary | ICD-10-CM | POA: Diagnosis not present

## 2015-06-12 DIAGNOSIS — N2581 Secondary hyperparathyroidism of renal origin: Secondary | ICD-10-CM | POA: Diagnosis not present

## 2015-06-12 DIAGNOSIS — D509 Iron deficiency anemia, unspecified: Secondary | ICD-10-CM | POA: Diagnosis not present

## 2015-06-12 DIAGNOSIS — D631 Anemia in chronic kidney disease: Secondary | ICD-10-CM | POA: Diagnosis not present

## 2015-06-12 DIAGNOSIS — K7460 Unspecified cirrhosis of liver: Secondary | ICD-10-CM | POA: Diagnosis not present

## 2015-06-14 DIAGNOSIS — N186 End stage renal disease: Secondary | ICD-10-CM | POA: Diagnosis not present

## 2015-06-14 DIAGNOSIS — N2581 Secondary hyperparathyroidism of renal origin: Secondary | ICD-10-CM | POA: Diagnosis not present

## 2015-06-14 DIAGNOSIS — K7460 Unspecified cirrhosis of liver: Secondary | ICD-10-CM | POA: Diagnosis not present

## 2015-06-14 DIAGNOSIS — D631 Anemia in chronic kidney disease: Secondary | ICD-10-CM | POA: Diagnosis not present

## 2015-06-14 DIAGNOSIS — D509 Iron deficiency anemia, unspecified: Secondary | ICD-10-CM | POA: Diagnosis not present

## 2015-06-17 DIAGNOSIS — D631 Anemia in chronic kidney disease: Secondary | ICD-10-CM | POA: Diagnosis not present

## 2015-06-17 DIAGNOSIS — N2581 Secondary hyperparathyroidism of renal origin: Secondary | ICD-10-CM | POA: Diagnosis not present

## 2015-06-17 DIAGNOSIS — K7460 Unspecified cirrhosis of liver: Secondary | ICD-10-CM | POA: Diagnosis not present

## 2015-06-17 DIAGNOSIS — N186 End stage renal disease: Secondary | ICD-10-CM | POA: Diagnosis not present

## 2015-06-17 DIAGNOSIS — D509 Iron deficiency anemia, unspecified: Secondary | ICD-10-CM | POA: Diagnosis not present

## 2015-06-21 DIAGNOSIS — K7460 Unspecified cirrhosis of liver: Secondary | ICD-10-CM | POA: Diagnosis not present

## 2015-06-21 DIAGNOSIS — N186 End stage renal disease: Secondary | ICD-10-CM | POA: Diagnosis not present

## 2015-06-21 DIAGNOSIS — N2581 Secondary hyperparathyroidism of renal origin: Secondary | ICD-10-CM | POA: Diagnosis not present

## 2015-06-21 DIAGNOSIS — D631 Anemia in chronic kidney disease: Secondary | ICD-10-CM | POA: Diagnosis not present

## 2015-06-21 DIAGNOSIS — D509 Iron deficiency anemia, unspecified: Secondary | ICD-10-CM | POA: Diagnosis not present

## 2015-06-24 DIAGNOSIS — N186 End stage renal disease: Secondary | ICD-10-CM | POA: Diagnosis not present

## 2015-06-24 DIAGNOSIS — D509 Iron deficiency anemia, unspecified: Secondary | ICD-10-CM | POA: Diagnosis not present

## 2015-06-24 DIAGNOSIS — N2581 Secondary hyperparathyroidism of renal origin: Secondary | ICD-10-CM | POA: Diagnosis not present

## 2015-06-24 DIAGNOSIS — K7460 Unspecified cirrhosis of liver: Secondary | ICD-10-CM | POA: Diagnosis not present

## 2015-06-24 DIAGNOSIS — D631 Anemia in chronic kidney disease: Secondary | ICD-10-CM | POA: Diagnosis not present

## 2015-06-26 DIAGNOSIS — K7460 Unspecified cirrhosis of liver: Secondary | ICD-10-CM | POA: Diagnosis not present

## 2015-06-26 DIAGNOSIS — N186 End stage renal disease: Secondary | ICD-10-CM | POA: Diagnosis not present

## 2015-06-26 DIAGNOSIS — D509 Iron deficiency anemia, unspecified: Secondary | ICD-10-CM | POA: Diagnosis not present

## 2015-06-26 DIAGNOSIS — D631 Anemia in chronic kidney disease: Secondary | ICD-10-CM | POA: Diagnosis not present

## 2015-06-26 DIAGNOSIS — N2581 Secondary hyperparathyroidism of renal origin: Secondary | ICD-10-CM | POA: Diagnosis not present

## 2015-06-28 DIAGNOSIS — N2581 Secondary hyperparathyroidism of renal origin: Secondary | ICD-10-CM | POA: Diagnosis not present

## 2015-06-28 DIAGNOSIS — D631 Anemia in chronic kidney disease: Secondary | ICD-10-CM | POA: Diagnosis not present

## 2015-06-28 DIAGNOSIS — K7460 Unspecified cirrhosis of liver: Secondary | ICD-10-CM | POA: Diagnosis not present

## 2015-06-28 DIAGNOSIS — N186 End stage renal disease: Secondary | ICD-10-CM | POA: Diagnosis not present

## 2015-06-28 DIAGNOSIS — D509 Iron deficiency anemia, unspecified: Secondary | ICD-10-CM | POA: Diagnosis not present

## 2015-07-01 DIAGNOSIS — K7460 Unspecified cirrhosis of liver: Secondary | ICD-10-CM | POA: Diagnosis not present

## 2015-07-01 DIAGNOSIS — N2581 Secondary hyperparathyroidism of renal origin: Secondary | ICD-10-CM | POA: Diagnosis not present

## 2015-07-01 DIAGNOSIS — D509 Iron deficiency anemia, unspecified: Secondary | ICD-10-CM | POA: Diagnosis not present

## 2015-07-01 DIAGNOSIS — D631 Anemia in chronic kidney disease: Secondary | ICD-10-CM | POA: Diagnosis not present

## 2015-07-01 DIAGNOSIS — I120 Hypertensive chronic kidney disease with stage 5 chronic kidney disease or end stage renal disease: Secondary | ICD-10-CM | POA: Diagnosis not present

## 2015-07-01 DIAGNOSIS — N186 End stage renal disease: Secondary | ICD-10-CM | POA: Diagnosis not present

## 2015-07-01 DIAGNOSIS — Z992 Dependence on renal dialysis: Secondary | ICD-10-CM | POA: Diagnosis not present

## 2015-07-05 DIAGNOSIS — D509 Iron deficiency anemia, unspecified: Secondary | ICD-10-CM | POA: Diagnosis not present

## 2015-07-05 DIAGNOSIS — D631 Anemia in chronic kidney disease: Secondary | ICD-10-CM | POA: Diagnosis not present

## 2015-07-05 DIAGNOSIS — N2581 Secondary hyperparathyroidism of renal origin: Secondary | ICD-10-CM | POA: Diagnosis not present

## 2015-07-05 DIAGNOSIS — N186 End stage renal disease: Secondary | ICD-10-CM | POA: Diagnosis not present

## 2015-07-08 DIAGNOSIS — N2581 Secondary hyperparathyroidism of renal origin: Secondary | ICD-10-CM | POA: Diagnosis not present

## 2015-07-08 DIAGNOSIS — D631 Anemia in chronic kidney disease: Secondary | ICD-10-CM | POA: Diagnosis not present

## 2015-07-08 DIAGNOSIS — N186 End stage renal disease: Secondary | ICD-10-CM | POA: Diagnosis not present

## 2015-07-08 DIAGNOSIS — D509 Iron deficiency anemia, unspecified: Secondary | ICD-10-CM | POA: Diagnosis not present

## 2015-07-10 DIAGNOSIS — N186 End stage renal disease: Secondary | ICD-10-CM | POA: Diagnosis not present

## 2015-07-10 DIAGNOSIS — D631 Anemia in chronic kidney disease: Secondary | ICD-10-CM | POA: Diagnosis not present

## 2015-07-10 DIAGNOSIS — D509 Iron deficiency anemia, unspecified: Secondary | ICD-10-CM | POA: Diagnosis not present

## 2015-07-10 DIAGNOSIS — N2581 Secondary hyperparathyroidism of renal origin: Secondary | ICD-10-CM | POA: Diagnosis not present

## 2015-07-12 DIAGNOSIS — N186 End stage renal disease: Secondary | ICD-10-CM | POA: Diagnosis not present

## 2015-07-12 DIAGNOSIS — D509 Iron deficiency anemia, unspecified: Secondary | ICD-10-CM | POA: Diagnosis not present

## 2015-07-12 DIAGNOSIS — N2581 Secondary hyperparathyroidism of renal origin: Secondary | ICD-10-CM | POA: Diagnosis not present

## 2015-07-12 DIAGNOSIS — D631 Anemia in chronic kidney disease: Secondary | ICD-10-CM | POA: Diagnosis not present

## 2015-07-15 DIAGNOSIS — N186 End stage renal disease: Secondary | ICD-10-CM | POA: Diagnosis not present

## 2015-07-15 DIAGNOSIS — D631 Anemia in chronic kidney disease: Secondary | ICD-10-CM | POA: Diagnosis not present

## 2015-07-15 DIAGNOSIS — N2581 Secondary hyperparathyroidism of renal origin: Secondary | ICD-10-CM | POA: Diagnosis not present

## 2015-07-15 DIAGNOSIS — D509 Iron deficiency anemia, unspecified: Secondary | ICD-10-CM | POA: Diagnosis not present

## 2015-07-16 MED FILL — PANTOPRAZOLE SOD DR 40 MG T: 40 | 30 days supply | Qty: 60 | Fill #4

## 2015-07-19 DIAGNOSIS — N186 End stage renal disease: Secondary | ICD-10-CM | POA: Diagnosis not present

## 2015-07-19 DIAGNOSIS — D509 Iron deficiency anemia, unspecified: Secondary | ICD-10-CM | POA: Diagnosis not present

## 2015-07-19 DIAGNOSIS — N2581 Secondary hyperparathyroidism of renal origin: Secondary | ICD-10-CM | POA: Diagnosis not present

## 2015-07-19 DIAGNOSIS — D631 Anemia in chronic kidney disease: Secondary | ICD-10-CM | POA: Diagnosis not present

## 2015-07-22 DIAGNOSIS — D631 Anemia in chronic kidney disease: Secondary | ICD-10-CM | POA: Diagnosis not present

## 2015-07-22 DIAGNOSIS — N186 End stage renal disease: Secondary | ICD-10-CM | POA: Diagnosis not present

## 2015-07-22 DIAGNOSIS — D509 Iron deficiency anemia, unspecified: Secondary | ICD-10-CM | POA: Diagnosis not present

## 2015-07-22 DIAGNOSIS — N2581 Secondary hyperparathyroidism of renal origin: Secondary | ICD-10-CM | POA: Diagnosis not present

## 2015-07-24 DIAGNOSIS — N2581 Secondary hyperparathyroidism of renal origin: Secondary | ICD-10-CM | POA: Diagnosis not present

## 2015-07-24 DIAGNOSIS — D509 Iron deficiency anemia, unspecified: Secondary | ICD-10-CM | POA: Diagnosis not present

## 2015-07-24 DIAGNOSIS — D631 Anemia in chronic kidney disease: Secondary | ICD-10-CM | POA: Diagnosis not present

## 2015-07-24 DIAGNOSIS — N186 End stage renal disease: Secondary | ICD-10-CM | POA: Diagnosis not present

## 2015-07-29 DIAGNOSIS — D509 Iron deficiency anemia, unspecified: Secondary | ICD-10-CM | POA: Diagnosis not present

## 2015-07-29 DIAGNOSIS — Z992 Dependence on renal dialysis: Secondary | ICD-10-CM | POA: Diagnosis not present

## 2015-07-29 DIAGNOSIS — I120 Hypertensive chronic kidney disease with stage 5 chronic kidney disease or end stage renal disease: Secondary | ICD-10-CM | POA: Diagnosis not present

## 2015-07-29 DIAGNOSIS — N186 End stage renal disease: Secondary | ICD-10-CM | POA: Diagnosis not present

## 2015-07-29 DIAGNOSIS — D631 Anemia in chronic kidney disease: Secondary | ICD-10-CM | POA: Diagnosis not present

## 2015-07-29 DIAGNOSIS — N2581 Secondary hyperparathyroidism of renal origin: Secondary | ICD-10-CM | POA: Diagnosis not present

## 2015-07-31 DIAGNOSIS — N2581 Secondary hyperparathyroidism of renal origin: Secondary | ICD-10-CM | POA: Diagnosis not present

## 2015-07-31 DIAGNOSIS — D631 Anemia in chronic kidney disease: Secondary | ICD-10-CM | POA: Diagnosis not present

## 2015-07-31 DIAGNOSIS — N186 End stage renal disease: Secondary | ICD-10-CM | POA: Diagnosis not present

## 2015-07-31 DIAGNOSIS — D509 Iron deficiency anemia, unspecified: Secondary | ICD-10-CM | POA: Diagnosis not present

## 2015-08-02 DIAGNOSIS — D509 Iron deficiency anemia, unspecified: Secondary | ICD-10-CM | POA: Diagnosis not present

## 2015-08-02 DIAGNOSIS — N2581 Secondary hyperparathyroidism of renal origin: Secondary | ICD-10-CM | POA: Diagnosis not present

## 2015-08-02 DIAGNOSIS — D631 Anemia in chronic kidney disease: Secondary | ICD-10-CM | POA: Diagnosis not present

## 2015-08-02 DIAGNOSIS — N186 End stage renal disease: Secondary | ICD-10-CM | POA: Diagnosis not present

## 2015-08-05 DIAGNOSIS — N2581 Secondary hyperparathyroidism of renal origin: Secondary | ICD-10-CM | POA: Diagnosis not present

## 2015-08-05 DIAGNOSIS — D509 Iron deficiency anemia, unspecified: Secondary | ICD-10-CM | POA: Diagnosis not present

## 2015-08-05 DIAGNOSIS — N186 End stage renal disease: Secondary | ICD-10-CM | POA: Diagnosis not present

## 2015-08-05 DIAGNOSIS — D631 Anemia in chronic kidney disease: Secondary | ICD-10-CM | POA: Diagnosis not present

## 2015-08-07 DIAGNOSIS — D509 Iron deficiency anemia, unspecified: Secondary | ICD-10-CM | POA: Diagnosis not present

## 2015-08-07 DIAGNOSIS — N2581 Secondary hyperparathyroidism of renal origin: Secondary | ICD-10-CM | POA: Diagnosis not present

## 2015-08-07 DIAGNOSIS — N186 End stage renal disease: Secondary | ICD-10-CM | POA: Diagnosis not present

## 2015-08-07 DIAGNOSIS — D631 Anemia in chronic kidney disease: Secondary | ICD-10-CM | POA: Diagnosis not present

## 2015-08-09 DIAGNOSIS — N2581 Secondary hyperparathyroidism of renal origin: Secondary | ICD-10-CM | POA: Diagnosis not present

## 2015-08-09 DIAGNOSIS — N186 End stage renal disease: Secondary | ICD-10-CM | POA: Diagnosis not present

## 2015-08-09 DIAGNOSIS — D631 Anemia in chronic kidney disease: Secondary | ICD-10-CM | POA: Diagnosis not present

## 2015-08-09 DIAGNOSIS — D509 Iron deficiency anemia, unspecified: Secondary | ICD-10-CM | POA: Diagnosis not present

## 2015-08-12 DIAGNOSIS — D509 Iron deficiency anemia, unspecified: Secondary | ICD-10-CM | POA: Diagnosis not present

## 2015-08-12 DIAGNOSIS — D631 Anemia in chronic kidney disease: Secondary | ICD-10-CM | POA: Diagnosis not present

## 2015-08-12 DIAGNOSIS — N186 End stage renal disease: Secondary | ICD-10-CM | POA: Diagnosis not present

## 2015-08-12 DIAGNOSIS — N2581 Secondary hyperparathyroidism of renal origin: Secondary | ICD-10-CM | POA: Diagnosis not present

## 2015-08-14 DIAGNOSIS — N186 End stage renal disease: Secondary | ICD-10-CM | POA: Diagnosis not present

## 2015-08-14 DIAGNOSIS — D631 Anemia in chronic kidney disease: Secondary | ICD-10-CM | POA: Diagnosis not present

## 2015-08-14 DIAGNOSIS — N2581 Secondary hyperparathyroidism of renal origin: Secondary | ICD-10-CM | POA: Diagnosis not present

## 2015-08-14 DIAGNOSIS — D509 Iron deficiency anemia, unspecified: Secondary | ICD-10-CM | POA: Diagnosis not present

## 2015-08-15 MED FILL — PANTOPRAZOLE SOD DR 40 MG T: 40 | 30 days supply | Qty: 60 | Fill #5

## 2015-08-16 DIAGNOSIS — D631 Anemia in chronic kidney disease: Secondary | ICD-10-CM | POA: Diagnosis not present

## 2015-08-16 DIAGNOSIS — N186 End stage renal disease: Secondary | ICD-10-CM | POA: Diagnosis not present

## 2015-08-16 DIAGNOSIS — D509 Iron deficiency anemia, unspecified: Secondary | ICD-10-CM | POA: Diagnosis not present

## 2015-08-16 DIAGNOSIS — N2581 Secondary hyperparathyroidism of renal origin: Secondary | ICD-10-CM | POA: Diagnosis not present

## 2015-08-19 DIAGNOSIS — N2581 Secondary hyperparathyroidism of renal origin: Secondary | ICD-10-CM | POA: Diagnosis not present

## 2015-08-19 DIAGNOSIS — D509 Iron deficiency anemia, unspecified: Secondary | ICD-10-CM | POA: Diagnosis not present

## 2015-08-19 DIAGNOSIS — D631 Anemia in chronic kidney disease: Secondary | ICD-10-CM | POA: Diagnosis not present

## 2015-08-19 DIAGNOSIS — N186 End stage renal disease: Secondary | ICD-10-CM | POA: Diagnosis not present

## 2015-08-21 DIAGNOSIS — N2581 Secondary hyperparathyroidism of renal origin: Secondary | ICD-10-CM | POA: Diagnosis not present

## 2015-08-21 DIAGNOSIS — D509 Iron deficiency anemia, unspecified: Secondary | ICD-10-CM | POA: Diagnosis not present

## 2015-08-21 DIAGNOSIS — N186 End stage renal disease: Secondary | ICD-10-CM | POA: Diagnosis not present

## 2015-08-21 DIAGNOSIS — D631 Anemia in chronic kidney disease: Secondary | ICD-10-CM | POA: Diagnosis not present

## 2015-08-26 DIAGNOSIS — D631 Anemia in chronic kidney disease: Secondary | ICD-10-CM | POA: Diagnosis not present

## 2015-08-26 DIAGNOSIS — N186 End stage renal disease: Secondary | ICD-10-CM | POA: Diagnosis not present

## 2015-08-26 DIAGNOSIS — N2581 Secondary hyperparathyroidism of renal origin: Secondary | ICD-10-CM | POA: Diagnosis not present

## 2015-08-26 DIAGNOSIS — D509 Iron deficiency anemia, unspecified: Secondary | ICD-10-CM | POA: Diagnosis not present

## 2015-08-28 DIAGNOSIS — D631 Anemia in chronic kidney disease: Secondary | ICD-10-CM | POA: Diagnosis not present

## 2015-08-28 DIAGNOSIS — D509 Iron deficiency anemia, unspecified: Secondary | ICD-10-CM | POA: Diagnosis not present

## 2015-08-28 DIAGNOSIS — N2581 Secondary hyperparathyroidism of renal origin: Secondary | ICD-10-CM | POA: Diagnosis not present

## 2015-08-28 DIAGNOSIS — N186 End stage renal disease: Secondary | ICD-10-CM | POA: Diagnosis not present

## 2015-08-29 DIAGNOSIS — I120 Hypertensive chronic kidney disease with stage 5 chronic kidney disease or end stage renal disease: Secondary | ICD-10-CM | POA: Diagnosis not present

## 2015-08-29 DIAGNOSIS — Z992 Dependence on renal dialysis: Secondary | ICD-10-CM | POA: Diagnosis not present

## 2015-08-29 DIAGNOSIS — N186 End stage renal disease: Secondary | ICD-10-CM | POA: Diagnosis not present

## 2015-08-30 DIAGNOSIS — N2581 Secondary hyperparathyroidism of renal origin: Secondary | ICD-10-CM | POA: Diagnosis not present

## 2015-08-30 DIAGNOSIS — D509 Iron deficiency anemia, unspecified: Secondary | ICD-10-CM | POA: Diagnosis not present

## 2015-08-30 DIAGNOSIS — N186 End stage renal disease: Secondary | ICD-10-CM | POA: Diagnosis not present

## 2015-08-30 DIAGNOSIS — D631 Anemia in chronic kidney disease: Secondary | ICD-10-CM | POA: Diagnosis not present

## 2015-08-30 DIAGNOSIS — K7460 Unspecified cirrhosis of liver: Secondary | ICD-10-CM | POA: Diagnosis not present

## 2015-09-02 DIAGNOSIS — N2581 Secondary hyperparathyroidism of renal origin: Secondary | ICD-10-CM | POA: Diagnosis not present

## 2015-09-02 DIAGNOSIS — N186 End stage renal disease: Secondary | ICD-10-CM | POA: Diagnosis not present

## 2015-09-02 DIAGNOSIS — K7460 Unspecified cirrhosis of liver: Secondary | ICD-10-CM | POA: Diagnosis not present

## 2015-09-02 DIAGNOSIS — D509 Iron deficiency anemia, unspecified: Secondary | ICD-10-CM | POA: Diagnosis not present

## 2015-09-02 DIAGNOSIS — D631 Anemia in chronic kidney disease: Secondary | ICD-10-CM | POA: Diagnosis not present

## 2015-09-03 DIAGNOSIS — Z992 Dependence on renal dialysis: Secondary | ICD-10-CM | POA: Diagnosis not present

## 2015-09-03 DIAGNOSIS — T82858D Stenosis of vascular prosthetic devices, implants and grafts, subsequent encounter: Secondary | ICD-10-CM | POA: Diagnosis not present

## 2015-09-03 DIAGNOSIS — N186 End stage renal disease: Secondary | ICD-10-CM | POA: Diagnosis not present

## 2015-09-03 DIAGNOSIS — I871 Compression of vein: Secondary | ICD-10-CM | POA: Diagnosis not present

## 2015-09-04 DIAGNOSIS — D631 Anemia in chronic kidney disease: Secondary | ICD-10-CM | POA: Diagnosis not present

## 2015-09-04 DIAGNOSIS — N186 End stage renal disease: Secondary | ICD-10-CM | POA: Diagnosis not present

## 2015-09-04 DIAGNOSIS — N2581 Secondary hyperparathyroidism of renal origin: Secondary | ICD-10-CM | POA: Diagnosis not present

## 2015-09-04 DIAGNOSIS — D509 Iron deficiency anemia, unspecified: Secondary | ICD-10-CM | POA: Diagnosis not present

## 2015-09-04 DIAGNOSIS — K7460 Unspecified cirrhosis of liver: Secondary | ICD-10-CM | POA: Diagnosis not present

## 2015-09-06 DIAGNOSIS — K7460 Unspecified cirrhosis of liver: Secondary | ICD-10-CM | POA: Diagnosis not present

## 2015-09-06 DIAGNOSIS — D631 Anemia in chronic kidney disease: Secondary | ICD-10-CM | POA: Diagnosis not present

## 2015-09-06 DIAGNOSIS — N186 End stage renal disease: Secondary | ICD-10-CM | POA: Diagnosis not present

## 2015-09-06 DIAGNOSIS — N2581 Secondary hyperparathyroidism of renal origin: Secondary | ICD-10-CM | POA: Diagnosis not present

## 2015-09-06 DIAGNOSIS — D509 Iron deficiency anemia, unspecified: Secondary | ICD-10-CM | POA: Diagnosis not present

## 2015-09-09 DIAGNOSIS — K7460 Unspecified cirrhosis of liver: Secondary | ICD-10-CM | POA: Diagnosis not present

## 2015-09-09 DIAGNOSIS — N2581 Secondary hyperparathyroidism of renal origin: Secondary | ICD-10-CM | POA: Diagnosis not present

## 2015-09-09 DIAGNOSIS — N186 End stage renal disease: Secondary | ICD-10-CM | POA: Diagnosis not present

## 2015-09-09 DIAGNOSIS — D631 Anemia in chronic kidney disease: Secondary | ICD-10-CM | POA: Diagnosis not present

## 2015-09-09 DIAGNOSIS — D509 Iron deficiency anemia, unspecified: Secondary | ICD-10-CM | POA: Diagnosis not present

## 2015-09-13 DIAGNOSIS — N186 End stage renal disease: Secondary | ICD-10-CM | POA: Diagnosis not present

## 2015-09-13 DIAGNOSIS — D509 Iron deficiency anemia, unspecified: Secondary | ICD-10-CM | POA: Diagnosis not present

## 2015-09-13 DIAGNOSIS — K7460 Unspecified cirrhosis of liver: Secondary | ICD-10-CM | POA: Diagnosis not present

## 2015-09-13 DIAGNOSIS — N2581 Secondary hyperparathyroidism of renal origin: Secondary | ICD-10-CM | POA: Diagnosis not present

## 2015-09-13 DIAGNOSIS — D631 Anemia in chronic kidney disease: Secondary | ICD-10-CM | POA: Diagnosis not present

## 2015-09-16 DIAGNOSIS — N186 End stage renal disease: Secondary | ICD-10-CM | POA: Diagnosis not present

## 2015-09-16 DIAGNOSIS — K7460 Unspecified cirrhosis of liver: Secondary | ICD-10-CM | POA: Diagnosis not present

## 2015-09-16 DIAGNOSIS — D509 Iron deficiency anemia, unspecified: Secondary | ICD-10-CM | POA: Diagnosis not present

## 2015-09-16 DIAGNOSIS — D631 Anemia in chronic kidney disease: Secondary | ICD-10-CM | POA: Diagnosis not present

## 2015-09-16 DIAGNOSIS — N2581 Secondary hyperparathyroidism of renal origin: Secondary | ICD-10-CM | POA: Diagnosis not present

## 2015-09-18 DIAGNOSIS — N2581 Secondary hyperparathyroidism of renal origin: Secondary | ICD-10-CM | POA: Diagnosis not present

## 2015-09-18 DIAGNOSIS — D631 Anemia in chronic kidney disease: Secondary | ICD-10-CM | POA: Diagnosis not present

## 2015-09-18 DIAGNOSIS — K7460 Unspecified cirrhosis of liver: Secondary | ICD-10-CM | POA: Diagnosis not present

## 2015-09-18 DIAGNOSIS — N186 End stage renal disease: Secondary | ICD-10-CM | POA: Diagnosis not present

## 2015-09-18 DIAGNOSIS — D509 Iron deficiency anemia, unspecified: Secondary | ICD-10-CM | POA: Diagnosis not present

## 2015-09-20 DIAGNOSIS — N2581 Secondary hyperparathyroidism of renal origin: Secondary | ICD-10-CM | POA: Diagnosis not present

## 2015-09-20 DIAGNOSIS — D631 Anemia in chronic kidney disease: Secondary | ICD-10-CM | POA: Diagnosis not present

## 2015-09-20 DIAGNOSIS — N186 End stage renal disease: Secondary | ICD-10-CM | POA: Diagnosis not present

## 2015-09-20 DIAGNOSIS — K7460 Unspecified cirrhosis of liver: Secondary | ICD-10-CM | POA: Diagnosis not present

## 2015-09-20 DIAGNOSIS — D509 Iron deficiency anemia, unspecified: Secondary | ICD-10-CM | POA: Diagnosis not present

## 2015-09-23 DIAGNOSIS — D509 Iron deficiency anemia, unspecified: Secondary | ICD-10-CM | POA: Diagnosis not present

## 2015-09-23 DIAGNOSIS — D631 Anemia in chronic kidney disease: Secondary | ICD-10-CM | POA: Diagnosis not present

## 2015-09-23 DIAGNOSIS — N186 End stage renal disease: Secondary | ICD-10-CM | POA: Diagnosis not present

## 2015-09-23 DIAGNOSIS — N2581 Secondary hyperparathyroidism of renal origin: Secondary | ICD-10-CM | POA: Diagnosis not present

## 2015-09-23 DIAGNOSIS — K7460 Unspecified cirrhosis of liver: Secondary | ICD-10-CM | POA: Diagnosis not present

## 2015-09-25 DIAGNOSIS — D509 Iron deficiency anemia, unspecified: Secondary | ICD-10-CM | POA: Diagnosis not present

## 2015-09-25 DIAGNOSIS — N2581 Secondary hyperparathyroidism of renal origin: Secondary | ICD-10-CM | POA: Diagnosis not present

## 2015-09-25 DIAGNOSIS — N186 End stage renal disease: Secondary | ICD-10-CM | POA: Diagnosis not present

## 2015-09-25 DIAGNOSIS — D631 Anemia in chronic kidney disease: Secondary | ICD-10-CM | POA: Diagnosis not present

## 2015-09-25 DIAGNOSIS — K7460 Unspecified cirrhosis of liver: Secondary | ICD-10-CM | POA: Diagnosis not present

## 2015-09-27 DIAGNOSIS — N2581 Secondary hyperparathyroidism of renal origin: Secondary | ICD-10-CM | POA: Diagnosis not present

## 2015-09-27 DIAGNOSIS — K7460 Unspecified cirrhosis of liver: Secondary | ICD-10-CM | POA: Diagnosis not present

## 2015-09-27 DIAGNOSIS — N186 End stage renal disease: Secondary | ICD-10-CM | POA: Diagnosis not present

## 2015-09-27 DIAGNOSIS — D631 Anemia in chronic kidney disease: Secondary | ICD-10-CM | POA: Diagnosis not present

## 2015-09-27 DIAGNOSIS — D509 Iron deficiency anemia, unspecified: Secondary | ICD-10-CM | POA: Diagnosis not present

## 2015-09-28 DIAGNOSIS — Z992 Dependence on renal dialysis: Secondary | ICD-10-CM | POA: Diagnosis not present

## 2015-09-28 DIAGNOSIS — I120 Hypertensive chronic kidney disease with stage 5 chronic kidney disease or end stage renal disease: Secondary | ICD-10-CM | POA: Diagnosis not present

## 2015-09-28 DIAGNOSIS — N186 End stage renal disease: Secondary | ICD-10-CM | POA: Diagnosis not present

## 2015-09-30 DIAGNOSIS — D509 Iron deficiency anemia, unspecified: Secondary | ICD-10-CM | POA: Diagnosis not present

## 2015-09-30 DIAGNOSIS — N186 End stage renal disease: Secondary | ICD-10-CM | POA: Diagnosis not present

## 2015-09-30 DIAGNOSIS — N2581 Secondary hyperparathyroidism of renal origin: Secondary | ICD-10-CM | POA: Diagnosis not present

## 2015-09-30 DIAGNOSIS — D631 Anemia in chronic kidney disease: Secondary | ICD-10-CM | POA: Diagnosis not present

## 2015-10-01 MED FILL — PANTOPRAZOLE SOD DR 40 MG T: 40 | 30 days supply | Qty: 60 | Fill #6

## 2015-10-04 DIAGNOSIS — D509 Iron deficiency anemia, unspecified: Secondary | ICD-10-CM | POA: Diagnosis not present

## 2015-10-04 DIAGNOSIS — N2581 Secondary hyperparathyroidism of renal origin: Secondary | ICD-10-CM | POA: Diagnosis not present

## 2015-10-04 DIAGNOSIS — D631 Anemia in chronic kidney disease: Secondary | ICD-10-CM | POA: Diagnosis not present

## 2015-10-04 DIAGNOSIS — N186 End stage renal disease: Secondary | ICD-10-CM | POA: Diagnosis not present

## 2015-10-07 DIAGNOSIS — D509 Iron deficiency anemia, unspecified: Secondary | ICD-10-CM | POA: Diagnosis not present

## 2015-10-07 DIAGNOSIS — N186 End stage renal disease: Secondary | ICD-10-CM | POA: Diagnosis not present

## 2015-10-07 DIAGNOSIS — N2581 Secondary hyperparathyroidism of renal origin: Secondary | ICD-10-CM | POA: Diagnosis not present

## 2015-10-07 DIAGNOSIS — D631 Anemia in chronic kidney disease: Secondary | ICD-10-CM | POA: Diagnosis not present

## 2015-10-09 DIAGNOSIS — D509 Iron deficiency anemia, unspecified: Secondary | ICD-10-CM | POA: Diagnosis not present

## 2015-10-09 DIAGNOSIS — D631 Anemia in chronic kidney disease: Secondary | ICD-10-CM | POA: Diagnosis not present

## 2015-10-09 DIAGNOSIS — N186 End stage renal disease: Secondary | ICD-10-CM | POA: Diagnosis not present

## 2015-10-09 DIAGNOSIS — N2581 Secondary hyperparathyroidism of renal origin: Secondary | ICD-10-CM | POA: Diagnosis not present

## 2015-10-11 DIAGNOSIS — N186 End stage renal disease: Secondary | ICD-10-CM | POA: Diagnosis not present

## 2015-10-11 DIAGNOSIS — N2581 Secondary hyperparathyroidism of renal origin: Secondary | ICD-10-CM | POA: Diagnosis not present

## 2015-10-11 DIAGNOSIS — D509 Iron deficiency anemia, unspecified: Secondary | ICD-10-CM | POA: Diagnosis not present

## 2015-10-11 DIAGNOSIS — D631 Anemia in chronic kidney disease: Secondary | ICD-10-CM | POA: Diagnosis not present

## 2015-10-14 DIAGNOSIS — D509 Iron deficiency anemia, unspecified: Secondary | ICD-10-CM | POA: Diagnosis not present

## 2015-10-14 DIAGNOSIS — N186 End stage renal disease: Secondary | ICD-10-CM | POA: Diagnosis not present

## 2015-10-14 DIAGNOSIS — D631 Anemia in chronic kidney disease: Secondary | ICD-10-CM | POA: Diagnosis not present

## 2015-10-14 DIAGNOSIS — N2581 Secondary hyperparathyroidism of renal origin: Secondary | ICD-10-CM | POA: Diagnosis not present

## 2015-10-18 DIAGNOSIS — D509 Iron deficiency anemia, unspecified: Secondary | ICD-10-CM | POA: Diagnosis not present

## 2015-10-18 DIAGNOSIS — N2581 Secondary hyperparathyroidism of renal origin: Secondary | ICD-10-CM | POA: Diagnosis not present

## 2015-10-18 DIAGNOSIS — D631 Anemia in chronic kidney disease: Secondary | ICD-10-CM | POA: Diagnosis not present

## 2015-10-18 DIAGNOSIS — N186 End stage renal disease: Secondary | ICD-10-CM | POA: Diagnosis not present

## 2015-10-21 DIAGNOSIS — N186 End stage renal disease: Secondary | ICD-10-CM | POA: Diagnosis not present

## 2015-10-21 DIAGNOSIS — D509 Iron deficiency anemia, unspecified: Secondary | ICD-10-CM | POA: Diagnosis not present

## 2015-10-21 DIAGNOSIS — N2581 Secondary hyperparathyroidism of renal origin: Secondary | ICD-10-CM | POA: Diagnosis not present

## 2015-10-21 DIAGNOSIS — D631 Anemia in chronic kidney disease: Secondary | ICD-10-CM | POA: Diagnosis not present

## 2015-10-23 DIAGNOSIS — N2581 Secondary hyperparathyroidism of renal origin: Secondary | ICD-10-CM | POA: Diagnosis not present

## 2015-10-23 DIAGNOSIS — D509 Iron deficiency anemia, unspecified: Secondary | ICD-10-CM | POA: Diagnosis not present

## 2015-10-23 DIAGNOSIS — D631 Anemia in chronic kidney disease: Secondary | ICD-10-CM | POA: Diagnosis not present

## 2015-10-23 DIAGNOSIS — N186 End stage renal disease: Secondary | ICD-10-CM | POA: Diagnosis not present

## 2015-10-25 DIAGNOSIS — D631 Anemia in chronic kidney disease: Secondary | ICD-10-CM | POA: Diagnosis not present

## 2015-10-25 DIAGNOSIS — N186 End stage renal disease: Secondary | ICD-10-CM | POA: Diagnosis not present

## 2015-10-25 DIAGNOSIS — N2581 Secondary hyperparathyroidism of renal origin: Secondary | ICD-10-CM | POA: Diagnosis not present

## 2015-10-25 DIAGNOSIS — D509 Iron deficiency anemia, unspecified: Secondary | ICD-10-CM | POA: Diagnosis not present

## 2015-10-28 DIAGNOSIS — N186 End stage renal disease: Secondary | ICD-10-CM | POA: Diagnosis not present

## 2015-10-28 DIAGNOSIS — N2581 Secondary hyperparathyroidism of renal origin: Secondary | ICD-10-CM | POA: Diagnosis not present

## 2015-10-28 DIAGNOSIS — D631 Anemia in chronic kidney disease: Secondary | ICD-10-CM | POA: Diagnosis not present

## 2015-10-28 DIAGNOSIS — D509 Iron deficiency anemia, unspecified: Secondary | ICD-10-CM | POA: Diagnosis not present

## 2015-10-29 DIAGNOSIS — I120 Hypertensive chronic kidney disease with stage 5 chronic kidney disease or end stage renal disease: Secondary | ICD-10-CM | POA: Diagnosis not present

## 2015-10-29 DIAGNOSIS — Z992 Dependence on renal dialysis: Secondary | ICD-10-CM | POA: Diagnosis not present

## 2015-10-29 DIAGNOSIS — N186 End stage renal disease: Secondary | ICD-10-CM | POA: Diagnosis not present

## 2015-10-30 DIAGNOSIS — D509 Iron deficiency anemia, unspecified: Secondary | ICD-10-CM | POA: Diagnosis not present

## 2015-10-30 DIAGNOSIS — D631 Anemia in chronic kidney disease: Secondary | ICD-10-CM | POA: Diagnosis not present

## 2015-10-30 DIAGNOSIS — N186 End stage renal disease: Secondary | ICD-10-CM | POA: Diagnosis not present

## 2015-10-30 DIAGNOSIS — N2581 Secondary hyperparathyroidism of renal origin: Secondary | ICD-10-CM | POA: Diagnosis not present

## 2015-11-01 DIAGNOSIS — D509 Iron deficiency anemia, unspecified: Secondary | ICD-10-CM | POA: Diagnosis not present

## 2015-11-01 DIAGNOSIS — N186 End stage renal disease: Secondary | ICD-10-CM | POA: Diagnosis not present

## 2015-11-01 DIAGNOSIS — N2581 Secondary hyperparathyroidism of renal origin: Secondary | ICD-10-CM | POA: Diagnosis not present

## 2015-11-01 DIAGNOSIS — D631 Anemia in chronic kidney disease: Secondary | ICD-10-CM | POA: Diagnosis not present

## 2015-11-03 ENCOUNTER — Other Ambulatory Visit: Payer: Self-pay | Admitting: Family Medicine

## 2015-11-03 MED FILL — PANTOPRAZOLE SOD DR 40 MG T: 40 | 30 days supply | Qty: 60 | Fill #0

## 2015-11-04 DIAGNOSIS — D631 Anemia in chronic kidney disease: Secondary | ICD-10-CM | POA: Diagnosis not present

## 2015-11-04 DIAGNOSIS — N186 End stage renal disease: Secondary | ICD-10-CM | POA: Diagnosis not present

## 2015-11-04 DIAGNOSIS — D509 Iron deficiency anemia, unspecified: Secondary | ICD-10-CM | POA: Diagnosis not present

## 2015-11-04 DIAGNOSIS — N2581 Secondary hyperparathyroidism of renal origin: Secondary | ICD-10-CM | POA: Diagnosis not present

## 2015-11-06 DIAGNOSIS — D631 Anemia in chronic kidney disease: Secondary | ICD-10-CM | POA: Diagnosis not present

## 2015-11-06 DIAGNOSIS — N2581 Secondary hyperparathyroidism of renal origin: Secondary | ICD-10-CM | POA: Diagnosis not present

## 2015-11-06 DIAGNOSIS — D509 Iron deficiency anemia, unspecified: Secondary | ICD-10-CM | POA: Diagnosis not present

## 2015-11-06 DIAGNOSIS — N186 End stage renal disease: Secondary | ICD-10-CM | POA: Diagnosis not present

## 2015-11-08 DIAGNOSIS — D631 Anemia in chronic kidney disease: Secondary | ICD-10-CM | POA: Diagnosis not present

## 2015-11-08 DIAGNOSIS — D509 Iron deficiency anemia, unspecified: Secondary | ICD-10-CM | POA: Diagnosis not present

## 2015-11-08 DIAGNOSIS — N186 End stage renal disease: Secondary | ICD-10-CM | POA: Diagnosis not present

## 2015-11-08 DIAGNOSIS — N2581 Secondary hyperparathyroidism of renal origin: Secondary | ICD-10-CM | POA: Diagnosis not present

## 2015-11-11 DIAGNOSIS — N186 End stage renal disease: Secondary | ICD-10-CM | POA: Diagnosis not present

## 2015-11-11 DIAGNOSIS — N2581 Secondary hyperparathyroidism of renal origin: Secondary | ICD-10-CM | POA: Diagnosis not present

## 2015-11-11 DIAGNOSIS — D509 Iron deficiency anemia, unspecified: Secondary | ICD-10-CM | POA: Diagnosis not present

## 2015-11-11 DIAGNOSIS — D631 Anemia in chronic kidney disease: Secondary | ICD-10-CM | POA: Diagnosis not present

## 2015-11-13 DIAGNOSIS — D631 Anemia in chronic kidney disease: Secondary | ICD-10-CM | POA: Diagnosis not present

## 2015-11-13 DIAGNOSIS — N2581 Secondary hyperparathyroidism of renal origin: Secondary | ICD-10-CM | POA: Diagnosis not present

## 2015-11-13 DIAGNOSIS — N186 End stage renal disease: Secondary | ICD-10-CM | POA: Diagnosis not present

## 2015-11-13 DIAGNOSIS — D509 Iron deficiency anemia, unspecified: Secondary | ICD-10-CM | POA: Diagnosis not present

## 2015-11-15 DIAGNOSIS — N2581 Secondary hyperparathyroidism of renal origin: Secondary | ICD-10-CM | POA: Diagnosis not present

## 2015-11-15 DIAGNOSIS — D631 Anemia in chronic kidney disease: Secondary | ICD-10-CM | POA: Diagnosis not present

## 2015-11-15 DIAGNOSIS — N186 End stage renal disease: Secondary | ICD-10-CM | POA: Diagnosis not present

## 2015-11-15 DIAGNOSIS — D509 Iron deficiency anemia, unspecified: Secondary | ICD-10-CM | POA: Diagnosis not present

## 2015-11-17 ENCOUNTER — Emergency Department (HOSPITAL_COMMUNITY)
Admission: EM | Admit: 2015-11-17 | Discharge: 2015-11-17 | Disposition: A | Discharge status: Home / Self Care | Payer: Medicare Other | Attending: Emergency Medicine | Admitting: Emergency Medicine

## 2015-11-17 ENCOUNTER — Other Ambulatory Visit: Payer: Self-pay

## 2015-11-17 ENCOUNTER — Encounter (HOSPITAL_COMMUNITY): Payer: Self-pay | Admitting: Emergency Medicine

## 2015-11-17 ENCOUNTER — Emergency Department (HOSPITAL_COMMUNITY): Payer: Medicare Other

## 2015-11-17 DIAGNOSIS — R0602 Shortness of breath: Secondary | ICD-10-CM | POA: Diagnosis not present

## 2015-11-17 DIAGNOSIS — I129 Hypertensive chronic kidney disease with stage 1 through stage 4 chronic kidney disease, or unspecified chronic kidney disease: Secondary | ICD-10-CM | POA: Diagnosis not present

## 2015-11-17 DIAGNOSIS — J45909 Unspecified asthma, uncomplicated: Secondary | ICD-10-CM | POA: Insufficient documentation

## 2015-11-17 DIAGNOSIS — F17210 Nicotine dependence, cigarettes, uncomplicated: Secondary | ICD-10-CM | POA: Diagnosis not present

## 2015-11-17 DIAGNOSIS — Z79899 Other long term (current) drug therapy: Secondary | ICD-10-CM | POA: Insufficient documentation

## 2015-11-17 DIAGNOSIS — N189 Chronic kidney disease, unspecified: Secondary | ICD-10-CM | POA: Diagnosis not present

## 2015-11-17 LAB — BASIC METABOLIC PANEL
Anion gap: 13 (ref 5–15)
BUN: 31 mg/dL — ABNORMAL HIGH (ref 6–20)
CO2: 23 mmol/L (ref 22–32)
Calcium: 9.3 mg/dL (ref 8.9–10.3)
Chloride: 97 mmol/L — ABNORMAL LOW (ref 101–111)
Creatinine, Ser: 9.4 mg/dL — ABNORMAL HIGH (ref 0.44–1.00)
GFR calc Af Amer: 5 mL/min — ABNORMAL LOW (ref >60)
GFR calc non Af Amer: 4 mL/min — ABNORMAL LOW (ref >60)
Glucose, Bld: 109 mg/dL — ABNORMAL HIGH (ref 65–99)
Potassium: 4.6 mmol/L (ref 3.5–5.1)
Sodium: 133 mmol/L — ABNORMAL LOW (ref 135–145)

## 2015-11-17 LAB — CBC
HCT: 31.4 % — ABNORMAL LOW (ref 36.0–46.0)
Hemoglobin: 10.4 g/dL — ABNORMAL LOW (ref 12.0–15.0)
MCH: 31.3 pg (ref 26.0–34.0)
MCHC: 33.1 g/dL (ref 30.0–36.0)
MCV: 94.6 fL (ref 78.0–100.0)
Platelets: 138 10*3/uL — ABNORMAL LOW (ref 150–400)
RBC: 3.32 MIL/uL — ABNORMAL LOW (ref 3.87–5.11)
RDW: 15.8 % — ABNORMAL HIGH (ref 11.5–15.5)
WBC: 9.4 10*3/uL (ref 4.0–10.5)

## 2015-11-17 MED ORDER — IPRATROPIUM-ALBUTEROL 0.5-2.5 (3) MG/3ML IN SOLN
3.0000 mL | Freq: Once | RESPIRATORY_TRACT | Status: AC
  Administered 2015-11-17: 3 mL via RESPIRATORY_TRACT
  Filled 2015-11-17: qty 3

## 2015-11-17 MED ORDER — ALBUTEROL SULFATE (2.5 MG/3ML) 0.083% IN NEBU
5.0000 mg | INHALATION_SOLUTION | Freq: Once | RESPIRATORY_TRACT | Status: AC
  Administered 2015-11-17: 5 mg via RESPIRATORY_TRACT
  Filled 2015-11-17: qty 6

## 2015-11-17 NOTE — Discharge Instructions (Signed)

## 2015-11-17 NOTE — ED Provider Notes (Signed)
CSN: [id number]     Arrival date & time 11/17/15  1809 History   First MD Initiated Contact with Patient 11/17/15 1822     Chief Complaint  Patient presents with  . Shortness of Breath     (Consider location/radiation/quality/duration/timing/severity/associated sxs/prior Treatment) HPI Comments: Patient presents to the emergency department with chief complaint of shortness of breath. She has a history of asthma, hypertension, and CKD on dialysis. She states that she had significant shortness of breath on Saturday prior to dialysis, but felt much better afterward. She is concerned that she has increased fluid which is causing her shortness of breath. She denies any fevers, chills, productive cough. Denies any chest pain or abdominal pain.  She was given a nebulizer treatment by EMS, and states that she had some improvement with this. There are no other modifying factors.  The history is provided by the patient. No language interpreter was used.    Past Medical History  Diagnosis Date  . Asthma   . Hepatitis C antibody test positive   . Hypertension     "just dx'd today" (11/03/2012)  . Anginal pain ([place])   . Chronic kidney disease     dialysis 3x wk   Past Surgical History  Procedure Laterality Date  . Abdominal hysterectomy      "partial" (11/03/2012)  . Esophagogastroduodenoscopy N/A 06/05/2013    Procedure: ESOPHAGOGASTRODUODENOSCOPY (EGD);  Surgeon: Winfield Cunas., MD;  Location: [facility];  Service: Endoscopy;  Laterality: N/A;  . Av fistula placement Right 12/03/2013    Procedure: CREATION OF ARTERIOVENOUS (AV) FISTULA RIGHT ARM ;  Surgeon: Rosetta Posner, MD;  Location: [facility];  Service: Vascular;  Laterality: Right;  . Bascilic vein transposition Right 03/06/2014    Procedure: SECOND STAGE BASILIC VEIN TRANSPOSITION;  Surgeon: Rosetta Posner, MD;  Location: [facility] OR;  Service: Vascular;  Laterality: Right;   Family History  Problem Relation Age of Onset  . Lupus Mother   .  Diabetes Father   . Heart attack Father    Social History  Substance Use Topics  . Smoking status: Light Tobacco Smoker -- 0.50 packs/day for 20 years    Types: Cigarettes  . Smokeless tobacco: Never Used  . Alcohol Use: No     Comment: 11/03/2012 "1-2 40oz beers/day maybe"   11/17/15 "I don;t drink beer anymore since Ive been on dialysis"   OB History    No data available     Review of Systems  Constitutional: Negative for fever and chills.  Respiratory: Positive for shortness of breath.   Cardiovascular: Negative for chest pain.  Gastrointestinal: Negative for nausea, vomiting, diarrhea and constipation.  Genitourinary: Negative for dysuria.  All other systems reviewed and are negative.     Allergies  Cefazolin  Home Medications   Prior to Admission medications   Medication Sig Start Date End Date Taking? Authorizing Provider  amLODipine (NORVASC) 2.5 MG tablet Take 1 tablet (2.5 mg total) by mouth daily. 12/11/14   Boykin Nearing, MD  hydrOXYzine (VISTARIL) 25 MG capsule Take 1 capsule (25 mg total) by mouth 3 (three) times daily as needed for itching. 12/11/14   Josalyn Funches, MD  metoprolol (LOPRESSOR) 50 MG tablet Take 1 tablet (50 mg total) by mouth 2 (two) times daily. 10/30/14   Josalyn Funches, MD  pantoprazole (PROTONIX) 40 MG tablet TAKE 1 TABLET BY MOUTH 2 TIMES DAILY 11/03/15   Boykin Nearing, MD  sevelamer carbonate (RENVELA) 800 MG tablet Take 3 tablets (  2,400 mg total) by mouth 3 (three) times daily with meals. 10/30/14   Josalyn Funches, MD   BP 193/136 mmHg  Pulse 90  Temp(Src) 97.7 F (36.5 C) (Oral)  Resp 24  Ht 5\' 7"  (1.702 m)  Wt 65.772 kg  BMI 22.71 kg/m2  SpO2 100% Physical Exam  Constitutional: She is oriented to person, place, and time. She appears well-developed and well-nourished.  HENT:  Head: Normocephalic and atraumatic.  Eyes: Conjunctivae and EOM are normal. Pupils are equal, round, and reactive to light.  Neck: Normal range of motion.  Neck supple.  Cardiovascular: Normal rate and regular rhythm.  Exam reveals no gallop and no friction rub.   No murmur heard. Pulmonary/Chest: Breath sounds normal. She has no wheezes. She has no rales. She exhibits no tenderness.  CTAB Increased work of breathing Speaks in short sentences Positive accessory muscle use   Abdominal: Soft. Bowel sounds are normal. She exhibits no distension and no mass. There is no tenderness. There is no rebound and no guarding.  Musculoskeletal: Normal range of motion. She exhibits no edema or tenderness.  Neurological: She is alert and oriented to person, place, and time.  Skin: Skin is warm and dry.  Psychiatric: She has a normal mood and affect. Her behavior is normal. Judgment and thought content normal.  Nursing note and vitals reviewed.   ED Course  Procedures (including critical care time) Results for orders placed or performed during the hospital encounter of 11/17/15  CBC  Result Value Ref Range   WBC 9.4 4.0 - 10.5 K/uL   RBC 3.32 (L) 3.87 - 5.11 MIL/uL   Hemoglobin 10.4 (L) 12.0 - 15.0 g/dL   HCT 31.4 (L) 36.0 - 46.0 %   MCV 94.6 78.0 - 100.0 fL   MCH 31.3 26.0 - 34.0 pg   MCHC 33.1 30.0 - 36.0 g/dL   RDW 15.8 (H) 11.5 - 15.5 %   Platelets 138 (L) 150 - 400 K/uL  Basic metabolic panel  Result Value Ref Range   Sodium 133 (L) 135 - 145 mmol/L   Potassium 4.6 3.5 - 5.1 mmol/L   Chloride 97 (L) 101 - 111 mmol/L   CO2 23 22 - 32 mmol/L   Glucose, Bld 109 (H) 65 - 99 mg/dL   BUN 31 (H) 6 - 20 mg/dL   Creatinine, Ser 9.40 (H) 0.44 - 1.00 mg/dL   Calcium 9.3 8.9 - 10.3 mg/dL   GFR calc non Af Amer 4 (L) >60 mL/min   GFR calc Af Amer 5 (L) >60 mL/min   Anion gap 13 5 - 15   Dg Chest 2 View  11/17/2015  CLINICAL DATA:  Acute onset of shortness of breath and wheezing. Chest tightness. Initial encounter. EXAM: CHEST  2 VIEW COMPARISON:  Chest radiograph from 11/26/2013 FINDINGS: The lungs are well-aerated. Small bilateral pleural  effusions are noted, right greater than left. Right basilar airspace opacity raises concern for pneumonia. There is no evidence of pneumothorax. The heart is mildly enlarged. No acute osseous abnormalities are seen. IMPRESSION: Right basilar airspace opacity raises concern for pneumonia. Small bilateral pleural effusions, right greater than left. Mild cardiomegaly. Electronically Signed   By: Garald Balding M.D.   On: 11/17/2015 19:17    I have personally reviewed and evaluated these images and lab results as part of my medical decision-making.   MDM   Final diagnoses:  SOB (shortness of breath)    Patient on dialysis. Has had worsening shortness breath over  the weekend. Had a similar episode on Saturday, but this was improved after dialysis. She also states that she had some improvement with a breathing treatment by EMS. I will repeat this. However, I'm concerned about her blood pressure being quite high at 190s over 130s. Will check chest x-ray.  Patient seen by and discussed with Dr. Ashok Cordia.  Patient states that she is feeling much better. She is no longer short of breath. She ambulates maintaining greater than 94% pulse oxygenation. Her blood pressure has trended down to 170/94. She is requesting to be discharged. She states that she will go to dialysis tomorrow. I advised her that she may require admission, she refused this. Her chest x-ray was remarkable for opacities in the right side, however I doubt this is pneumonia given that the patient does not have a fever or cough. I suspect this is mostly related to potential fluid overload.  Because the patient is able to ambulate and her blood pressures come down all while maintaining normal pulse oxygenation and having no further shortness of breath, feel that it is reasonable for the patient to be discharged at this time with clear and strict return precautions. The patient will go to dialysis tomorrow. She understands and agrees the plan. She is  stable and ready for discharge.    Montine Circle, PA-C 11/17/15 2037  Lajean Saver, MD 11/17/15 [phone]

## 2015-11-17 NOTE — ED Notes (Signed)
Pt reports SOB starting last night, worsening today.  Pt reports last dialysis Saturday.  EMS reports giving 5mg  albuterol. Accessory muscle use noted. Pt able to speak in short sentences.

## 2015-11-17 NOTE — ED Notes (Signed)
PA at bedside.

## 2015-11-17 NOTE — ED Notes (Signed)
Patient able to ambulate independently  

## 2015-11-18 DIAGNOSIS — D631 Anemia in chronic kidney disease: Secondary | ICD-10-CM | POA: Diagnosis not present

## 2015-11-18 DIAGNOSIS — N186 End stage renal disease: Secondary | ICD-10-CM | POA: Diagnosis not present

## 2015-11-18 DIAGNOSIS — N2581 Secondary hyperparathyroidism of renal origin: Secondary | ICD-10-CM | POA: Diagnosis not present

## 2015-11-18 DIAGNOSIS — D509 Iron deficiency anemia, unspecified: Secondary | ICD-10-CM | POA: Diagnosis not present

## 2015-11-20 DIAGNOSIS — N2581 Secondary hyperparathyroidism of renal origin: Secondary | ICD-10-CM | POA: Diagnosis not present

## 2015-11-20 DIAGNOSIS — D509 Iron deficiency anemia, unspecified: Secondary | ICD-10-CM | POA: Diagnosis not present

## 2015-11-20 DIAGNOSIS — D631 Anemia in chronic kidney disease: Secondary | ICD-10-CM | POA: Diagnosis not present

## 2015-11-20 DIAGNOSIS — N186 End stage renal disease: Secondary | ICD-10-CM | POA: Diagnosis not present

## 2015-11-22 DIAGNOSIS — D509 Iron deficiency anemia, unspecified: Secondary | ICD-10-CM | POA: Diagnosis not present

## 2015-11-22 DIAGNOSIS — N2581 Secondary hyperparathyroidism of renal origin: Secondary | ICD-10-CM | POA: Diagnosis not present

## 2015-11-22 DIAGNOSIS — N186 End stage renal disease: Secondary | ICD-10-CM | POA: Diagnosis not present

## 2015-11-22 DIAGNOSIS — D631 Anemia in chronic kidney disease: Secondary | ICD-10-CM | POA: Diagnosis not present

## 2015-11-24 DIAGNOSIS — H0011 Chalazion right upper eyelid: Secondary | ICD-10-CM | POA: Diagnosis not present

## 2015-11-25 DIAGNOSIS — N186 End stage renal disease: Secondary | ICD-10-CM | POA: Diagnosis not present

## 2015-11-25 DIAGNOSIS — D631 Anemia in chronic kidney disease: Secondary | ICD-10-CM | POA: Diagnosis not present

## 2015-11-25 DIAGNOSIS — D509 Iron deficiency anemia, unspecified: Secondary | ICD-10-CM | POA: Diagnosis not present

## 2015-11-25 DIAGNOSIS — N2581 Secondary hyperparathyroidism of renal origin: Secondary | ICD-10-CM | POA: Diagnosis not present

## 2015-11-27 DIAGNOSIS — D509 Iron deficiency anemia, unspecified: Secondary | ICD-10-CM | POA: Diagnosis not present

## 2015-11-27 DIAGNOSIS — N186 End stage renal disease: Secondary | ICD-10-CM | POA: Diagnosis not present

## 2015-11-27 DIAGNOSIS — N2581 Secondary hyperparathyroidism of renal origin: Secondary | ICD-10-CM | POA: Diagnosis not present

## 2015-11-27 DIAGNOSIS — D631 Anemia in chronic kidney disease: Secondary | ICD-10-CM | POA: Diagnosis not present

## 2015-11-28 DIAGNOSIS — Z992 Dependence on renal dialysis: Secondary | ICD-10-CM | POA: Diagnosis not present

## 2015-11-28 DIAGNOSIS — N186 End stage renal disease: Secondary | ICD-10-CM | POA: Diagnosis not present

## 2015-11-28 DIAGNOSIS — I120 Hypertensive chronic kidney disease with stage 5 chronic kidney disease or end stage renal disease: Secondary | ICD-10-CM | POA: Diagnosis not present

## 2015-11-29 DIAGNOSIS — D509 Iron deficiency anemia, unspecified: Secondary | ICD-10-CM | POA: Diagnosis not present

## 2015-11-29 DIAGNOSIS — K7460 Unspecified cirrhosis of liver: Secondary | ICD-10-CM | POA: Diagnosis not present

## 2015-11-29 DIAGNOSIS — D631 Anemia in chronic kidney disease: Secondary | ICD-10-CM | POA: Diagnosis not present

## 2015-11-29 DIAGNOSIS — N2581 Secondary hyperparathyroidism of renal origin: Secondary | ICD-10-CM | POA: Diagnosis not present

## 2015-11-29 DIAGNOSIS — N186 End stage renal disease: Secondary | ICD-10-CM | POA: Diagnosis not present

## 2015-12-02 DIAGNOSIS — D631 Anemia in chronic kidney disease: Secondary | ICD-10-CM | POA: Diagnosis not present

## 2015-12-02 DIAGNOSIS — N186 End stage renal disease: Secondary | ICD-10-CM | POA: Diagnosis not present

## 2015-12-02 DIAGNOSIS — N2581 Secondary hyperparathyroidism of renal origin: Secondary | ICD-10-CM | POA: Diagnosis not present

## 2015-12-02 DIAGNOSIS — K7460 Unspecified cirrhosis of liver: Secondary | ICD-10-CM | POA: Diagnosis not present

## 2015-12-02 DIAGNOSIS — D509 Iron deficiency anemia, unspecified: Secondary | ICD-10-CM | POA: Diagnosis not present

## 2015-12-04 DIAGNOSIS — D509 Iron deficiency anemia, unspecified: Secondary | ICD-10-CM | POA: Diagnosis not present

## 2015-12-04 DIAGNOSIS — D631 Anemia in chronic kidney disease: Secondary | ICD-10-CM | POA: Diagnosis not present

## 2015-12-04 DIAGNOSIS — K7460 Unspecified cirrhosis of liver: Secondary | ICD-10-CM | POA: Diagnosis not present

## 2015-12-04 DIAGNOSIS — N2581 Secondary hyperparathyroidism of renal origin: Secondary | ICD-10-CM | POA: Diagnosis not present

## 2015-12-04 DIAGNOSIS — N186 End stage renal disease: Secondary | ICD-10-CM | POA: Diagnosis not present

## 2015-12-05 MED FILL — PANTOPRAZOLE SOD DR 40 MG T: 40 | 30 days supply | Qty: 60 | Fill #1

## 2015-12-06 DIAGNOSIS — N2581 Secondary hyperparathyroidism of renal origin: Secondary | ICD-10-CM | POA: Diagnosis not present

## 2015-12-06 DIAGNOSIS — D631 Anemia in chronic kidney disease: Secondary | ICD-10-CM | POA: Diagnosis not present

## 2015-12-06 DIAGNOSIS — D509 Iron deficiency anemia, unspecified: Secondary | ICD-10-CM | POA: Diagnosis not present

## 2015-12-06 DIAGNOSIS — N186 End stage renal disease: Secondary | ICD-10-CM | POA: Diagnosis not present

## 2015-12-06 DIAGNOSIS — K7460 Unspecified cirrhosis of liver: Secondary | ICD-10-CM | POA: Diagnosis not present

## 2015-12-09 DIAGNOSIS — K7460 Unspecified cirrhosis of liver: Secondary | ICD-10-CM | POA: Diagnosis not present

## 2015-12-09 DIAGNOSIS — D631 Anemia in chronic kidney disease: Secondary | ICD-10-CM | POA: Diagnosis not present

## 2015-12-09 DIAGNOSIS — N186 End stage renal disease: Secondary | ICD-10-CM | POA: Diagnosis not present

## 2015-12-09 DIAGNOSIS — D509 Iron deficiency anemia, unspecified: Secondary | ICD-10-CM | POA: Diagnosis not present

## 2015-12-09 DIAGNOSIS — N2581 Secondary hyperparathyroidism of renal origin: Secondary | ICD-10-CM | POA: Diagnosis not present

## 2015-12-11 DIAGNOSIS — D509 Iron deficiency anemia, unspecified: Secondary | ICD-10-CM | POA: Diagnosis not present

## 2015-12-11 DIAGNOSIS — N186 End stage renal disease: Secondary | ICD-10-CM | POA: Diagnosis not present

## 2015-12-11 DIAGNOSIS — N2581 Secondary hyperparathyroidism of renal origin: Secondary | ICD-10-CM | POA: Diagnosis not present

## 2015-12-11 DIAGNOSIS — D631 Anemia in chronic kidney disease: Secondary | ICD-10-CM | POA: Diagnosis not present

## 2015-12-11 DIAGNOSIS — K7460 Unspecified cirrhosis of liver: Secondary | ICD-10-CM | POA: Diagnosis not present

## 2015-12-13 DIAGNOSIS — D631 Anemia in chronic kidney disease: Secondary | ICD-10-CM | POA: Diagnosis not present

## 2015-12-13 DIAGNOSIS — D509 Iron deficiency anemia, unspecified: Secondary | ICD-10-CM | POA: Diagnosis not present

## 2015-12-13 DIAGNOSIS — K7460 Unspecified cirrhosis of liver: Secondary | ICD-10-CM | POA: Diagnosis not present

## 2015-12-13 DIAGNOSIS — N2581 Secondary hyperparathyroidism of renal origin: Secondary | ICD-10-CM | POA: Diagnosis not present

## 2015-12-13 DIAGNOSIS — N186 End stage renal disease: Secondary | ICD-10-CM | POA: Diagnosis not present

## 2015-12-16 DIAGNOSIS — D631 Anemia in chronic kidney disease: Secondary | ICD-10-CM | POA: Diagnosis not present

## 2015-12-16 DIAGNOSIS — D509 Iron deficiency anemia, unspecified: Secondary | ICD-10-CM | POA: Diagnosis not present

## 2015-12-16 DIAGNOSIS — N2581 Secondary hyperparathyroidism of renal origin: Secondary | ICD-10-CM | POA: Diagnosis not present

## 2015-12-16 DIAGNOSIS — K7460 Unspecified cirrhosis of liver: Secondary | ICD-10-CM | POA: Diagnosis not present

## 2015-12-16 DIAGNOSIS — N186 End stage renal disease: Secondary | ICD-10-CM | POA: Diagnosis not present

## 2015-12-18 DIAGNOSIS — D509 Iron deficiency anemia, unspecified: Secondary | ICD-10-CM | POA: Diagnosis not present

## 2015-12-18 DIAGNOSIS — N2581 Secondary hyperparathyroidism of renal origin: Secondary | ICD-10-CM | POA: Diagnosis not present

## 2015-12-18 DIAGNOSIS — K7460 Unspecified cirrhosis of liver: Secondary | ICD-10-CM | POA: Diagnosis not present

## 2015-12-18 DIAGNOSIS — D631 Anemia in chronic kidney disease: Secondary | ICD-10-CM | POA: Diagnosis not present

## 2015-12-18 DIAGNOSIS — N186 End stage renal disease: Secondary | ICD-10-CM | POA: Diagnosis not present

## 2015-12-20 DIAGNOSIS — N186 End stage renal disease: Secondary | ICD-10-CM | POA: Diagnosis not present

## 2015-12-20 DIAGNOSIS — D509 Iron deficiency anemia, unspecified: Secondary | ICD-10-CM | POA: Diagnosis not present

## 2015-12-20 DIAGNOSIS — N2581 Secondary hyperparathyroidism of renal origin: Secondary | ICD-10-CM | POA: Diagnosis not present

## 2015-12-20 DIAGNOSIS — D631 Anemia in chronic kidney disease: Secondary | ICD-10-CM | POA: Diagnosis not present

## 2015-12-20 DIAGNOSIS — K7460 Unspecified cirrhosis of liver: Secondary | ICD-10-CM | POA: Diagnosis not present

## 2015-12-23 DIAGNOSIS — K7460 Unspecified cirrhosis of liver: Secondary | ICD-10-CM | POA: Diagnosis not present

## 2015-12-23 DIAGNOSIS — D631 Anemia in chronic kidney disease: Secondary | ICD-10-CM | POA: Diagnosis not present

## 2015-12-23 DIAGNOSIS — D509 Iron deficiency anemia, unspecified: Secondary | ICD-10-CM | POA: Diagnosis not present

## 2015-12-23 DIAGNOSIS — N2581 Secondary hyperparathyroidism of renal origin: Secondary | ICD-10-CM | POA: Diagnosis not present

## 2015-12-23 DIAGNOSIS — N186 End stage renal disease: Secondary | ICD-10-CM | POA: Diagnosis not present

## 2015-12-25 DIAGNOSIS — K7460 Unspecified cirrhosis of liver: Secondary | ICD-10-CM | POA: Diagnosis not present

## 2015-12-25 DIAGNOSIS — N2581 Secondary hyperparathyroidism of renal origin: Secondary | ICD-10-CM | POA: Diagnosis not present

## 2015-12-25 DIAGNOSIS — D509 Iron deficiency anemia, unspecified: Secondary | ICD-10-CM | POA: Diagnosis not present

## 2015-12-25 DIAGNOSIS — D631 Anemia in chronic kidney disease: Secondary | ICD-10-CM | POA: Diagnosis not present

## 2015-12-25 DIAGNOSIS — N186 End stage renal disease: Secondary | ICD-10-CM | POA: Diagnosis not present

## 2015-12-27 DIAGNOSIS — D509 Iron deficiency anemia, unspecified: Secondary | ICD-10-CM | POA: Diagnosis not present

## 2015-12-27 DIAGNOSIS — N2581 Secondary hyperparathyroidism of renal origin: Secondary | ICD-10-CM | POA: Diagnosis not present

## 2015-12-27 DIAGNOSIS — K7460 Unspecified cirrhosis of liver: Secondary | ICD-10-CM | POA: Diagnosis not present

## 2015-12-27 DIAGNOSIS — D631 Anemia in chronic kidney disease: Secondary | ICD-10-CM | POA: Diagnosis not present

## 2015-12-27 DIAGNOSIS — N186 End stage renal disease: Secondary | ICD-10-CM | POA: Diagnosis not present

## 2015-12-29 DIAGNOSIS — N186 End stage renal disease: Secondary | ICD-10-CM | POA: Diagnosis not present

## 2015-12-29 DIAGNOSIS — Z992 Dependence on renal dialysis: Secondary | ICD-10-CM | POA: Diagnosis not present

## 2015-12-29 DIAGNOSIS — I120 Hypertensive chronic kidney disease with stage 5 chronic kidney disease or end stage renal disease: Secondary | ICD-10-CM | POA: Diagnosis not present

## 2015-12-30 DIAGNOSIS — D631 Anemia in chronic kidney disease: Secondary | ICD-10-CM | POA: Diagnosis not present

## 2015-12-30 DIAGNOSIS — D509 Iron deficiency anemia, unspecified: Secondary | ICD-10-CM | POA: Diagnosis not present

## 2015-12-30 DIAGNOSIS — N2581 Secondary hyperparathyroidism of renal origin: Secondary | ICD-10-CM | POA: Diagnosis not present

## 2015-12-30 DIAGNOSIS — N186 End stage renal disease: Secondary | ICD-10-CM | POA: Diagnosis not present

## 2016-01-01 DIAGNOSIS — D631 Anemia in chronic kidney disease: Secondary | ICD-10-CM | POA: Diagnosis not present

## 2016-01-01 DIAGNOSIS — N2581 Secondary hyperparathyroidism of renal origin: Secondary | ICD-10-CM | POA: Diagnosis not present

## 2016-01-01 DIAGNOSIS — D509 Iron deficiency anemia, unspecified: Secondary | ICD-10-CM | POA: Diagnosis not present

## 2016-01-01 DIAGNOSIS — N186 End stage renal disease: Secondary | ICD-10-CM | POA: Diagnosis not present

## 2016-01-03 DIAGNOSIS — N2581 Secondary hyperparathyroidism of renal origin: Secondary | ICD-10-CM | POA: Diagnosis not present

## 2016-01-03 DIAGNOSIS — D509 Iron deficiency anemia, unspecified: Secondary | ICD-10-CM | POA: Diagnosis not present

## 2016-01-03 DIAGNOSIS — D631 Anemia in chronic kidney disease: Secondary | ICD-10-CM | POA: Diagnosis not present

## 2016-01-03 DIAGNOSIS — N186 End stage renal disease: Secondary | ICD-10-CM | POA: Diagnosis not present

## 2016-01-06 DIAGNOSIS — N2581 Secondary hyperparathyroidism of renal origin: Secondary | ICD-10-CM | POA: Diagnosis not present

## 2016-01-06 DIAGNOSIS — N186 End stage renal disease: Secondary | ICD-10-CM | POA: Diagnosis not present

## 2016-01-06 DIAGNOSIS — D509 Iron deficiency anemia, unspecified: Secondary | ICD-10-CM | POA: Diagnosis not present

## 2016-01-06 DIAGNOSIS — D631 Anemia in chronic kidney disease: Secondary | ICD-10-CM | POA: Diagnosis not present

## 2016-01-08 DIAGNOSIS — N186 End stage renal disease: Secondary | ICD-10-CM | POA: Diagnosis not present

## 2016-01-08 DIAGNOSIS — N2581 Secondary hyperparathyroidism of renal origin: Secondary | ICD-10-CM | POA: Diagnosis not present

## 2016-01-08 DIAGNOSIS — D631 Anemia in chronic kidney disease: Secondary | ICD-10-CM | POA: Diagnosis not present

## 2016-01-08 DIAGNOSIS — D509 Iron deficiency anemia, unspecified: Secondary | ICD-10-CM | POA: Diagnosis not present

## 2016-01-10 DIAGNOSIS — D509 Iron deficiency anemia, unspecified: Secondary | ICD-10-CM | POA: Diagnosis not present

## 2016-01-10 DIAGNOSIS — D631 Anemia in chronic kidney disease: Secondary | ICD-10-CM | POA: Diagnosis not present

## 2016-01-10 DIAGNOSIS — N2581 Secondary hyperparathyroidism of renal origin: Secondary | ICD-10-CM | POA: Diagnosis not present

## 2016-01-10 DIAGNOSIS — N186 End stage renal disease: Secondary | ICD-10-CM | POA: Diagnosis not present

## 2016-01-13 DIAGNOSIS — D631 Anemia in chronic kidney disease: Secondary | ICD-10-CM | POA: Diagnosis not present

## 2016-01-13 DIAGNOSIS — N186 End stage renal disease: Secondary | ICD-10-CM | POA: Diagnosis not present

## 2016-01-13 DIAGNOSIS — N2581 Secondary hyperparathyroidism of renal origin: Secondary | ICD-10-CM | POA: Diagnosis not present

## 2016-01-13 DIAGNOSIS — D509 Iron deficiency anemia, unspecified: Secondary | ICD-10-CM | POA: Diagnosis not present

## 2016-01-14 MED FILL — PANTOPRAZOLE SOD DR 40 MG T: 40 | 30 days supply | Qty: 60 | Fill #2

## 2016-01-15 DIAGNOSIS — N186 End stage renal disease: Secondary | ICD-10-CM | POA: Diagnosis not present

## 2016-01-15 DIAGNOSIS — D509 Iron deficiency anemia, unspecified: Secondary | ICD-10-CM | POA: Diagnosis not present

## 2016-01-15 DIAGNOSIS — D631 Anemia in chronic kidney disease: Secondary | ICD-10-CM | POA: Diagnosis not present

## 2016-01-15 DIAGNOSIS — N2581 Secondary hyperparathyroidism of renal origin: Secondary | ICD-10-CM | POA: Diagnosis not present

## 2016-01-17 DIAGNOSIS — D509 Iron deficiency anemia, unspecified: Secondary | ICD-10-CM | POA: Diagnosis not present

## 2016-01-17 DIAGNOSIS — N2581 Secondary hyperparathyroidism of renal origin: Secondary | ICD-10-CM | POA: Diagnosis not present

## 2016-01-17 DIAGNOSIS — N186 End stage renal disease: Secondary | ICD-10-CM | POA: Diagnosis not present

## 2016-01-17 DIAGNOSIS — D631 Anemia in chronic kidney disease: Secondary | ICD-10-CM | POA: Diagnosis not present

## 2016-01-20 DIAGNOSIS — N2581 Secondary hyperparathyroidism of renal origin: Secondary | ICD-10-CM | POA: Diagnosis not present

## 2016-01-20 DIAGNOSIS — N186 End stage renal disease: Secondary | ICD-10-CM | POA: Diagnosis not present

## 2016-01-20 DIAGNOSIS — D509 Iron deficiency anemia, unspecified: Secondary | ICD-10-CM | POA: Diagnosis not present

## 2016-01-20 DIAGNOSIS — D631 Anemia in chronic kidney disease: Secondary | ICD-10-CM | POA: Diagnosis not present

## 2016-01-22 DIAGNOSIS — N2581 Secondary hyperparathyroidism of renal origin: Secondary | ICD-10-CM | POA: Diagnosis not present

## 2016-01-22 DIAGNOSIS — D631 Anemia in chronic kidney disease: Secondary | ICD-10-CM | POA: Diagnosis not present

## 2016-01-22 DIAGNOSIS — N186 End stage renal disease: Secondary | ICD-10-CM | POA: Diagnosis not present

## 2016-01-22 DIAGNOSIS — D509 Iron deficiency anemia, unspecified: Secondary | ICD-10-CM | POA: Diagnosis not present

## 2016-01-24 DIAGNOSIS — D631 Anemia in chronic kidney disease: Secondary | ICD-10-CM | POA: Diagnosis not present

## 2016-01-24 DIAGNOSIS — D509 Iron deficiency anemia, unspecified: Secondary | ICD-10-CM | POA: Diagnosis not present

## 2016-01-24 DIAGNOSIS — N2581 Secondary hyperparathyroidism of renal origin: Secondary | ICD-10-CM | POA: Diagnosis not present

## 2016-01-24 DIAGNOSIS — N186 End stage renal disease: Secondary | ICD-10-CM | POA: Diagnosis not present

## 2016-01-27 DIAGNOSIS — N186 End stage renal disease: Secondary | ICD-10-CM | POA: Diagnosis not present

## 2016-01-27 DIAGNOSIS — D631 Anemia in chronic kidney disease: Secondary | ICD-10-CM | POA: Diagnosis not present

## 2016-01-27 DIAGNOSIS — N2581 Secondary hyperparathyroidism of renal origin: Secondary | ICD-10-CM | POA: Diagnosis not present

## 2016-01-27 DIAGNOSIS — D509 Iron deficiency anemia, unspecified: Secondary | ICD-10-CM | POA: Diagnosis not present

## 2016-01-29 DIAGNOSIS — N2581 Secondary hyperparathyroidism of renal origin: Secondary | ICD-10-CM | POA: Diagnosis not present

## 2016-01-29 DIAGNOSIS — N186 End stage renal disease: Secondary | ICD-10-CM | POA: Diagnosis not present

## 2016-01-29 DIAGNOSIS — D509 Iron deficiency anemia, unspecified: Secondary | ICD-10-CM | POA: Diagnosis not present

## 2016-01-29 DIAGNOSIS — I120 Hypertensive chronic kidney disease with stage 5 chronic kidney disease or end stage renal disease: Secondary | ICD-10-CM | POA: Diagnosis not present

## 2016-01-29 DIAGNOSIS — D631 Anemia in chronic kidney disease: Secondary | ICD-10-CM | POA: Diagnosis not present

## 2016-01-29 DIAGNOSIS — Z992 Dependence on renal dialysis: Secondary | ICD-10-CM | POA: Diagnosis not present

## 2016-01-31 DIAGNOSIS — Z23 Encounter for immunization: Secondary | ICD-10-CM | POA: Diagnosis not present

## 2016-01-31 DIAGNOSIS — T8249XD Other complication of vascular dialysis catheter, subsequent encounter: Secondary | ICD-10-CM | POA: Diagnosis not present

## 2016-01-31 DIAGNOSIS — T82598A Other mechanical complication of other cardiac and vascular devices and implants, initial encounter: Secondary | ICD-10-CM | POA: Diagnosis not present

## 2016-01-31 DIAGNOSIS — D631 Anemia in chronic kidney disease: Secondary | ICD-10-CM | POA: Diagnosis not present

## 2016-01-31 DIAGNOSIS — N2581 Secondary hyperparathyroidism of renal origin: Secondary | ICD-10-CM | POA: Diagnosis not present

## 2016-01-31 DIAGNOSIS — N186 End stage renal disease: Secondary | ICD-10-CM | POA: Diagnosis not present

## 2016-01-31 DIAGNOSIS — D509 Iron deficiency anemia, unspecified: Secondary | ICD-10-CM | POA: Diagnosis not present

## 2016-02-03 DIAGNOSIS — Z23 Encounter for immunization: Secondary | ICD-10-CM | POA: Diagnosis not present

## 2016-02-03 DIAGNOSIS — D509 Iron deficiency anemia, unspecified: Secondary | ICD-10-CM | POA: Diagnosis not present

## 2016-02-03 DIAGNOSIS — T82598A Other mechanical complication of other cardiac and vascular devices and implants, initial encounter: Secondary | ICD-10-CM | POA: Diagnosis not present

## 2016-02-03 DIAGNOSIS — D631 Anemia in chronic kidney disease: Secondary | ICD-10-CM | POA: Diagnosis not present

## 2016-02-03 DIAGNOSIS — N186 End stage renal disease: Secondary | ICD-10-CM | POA: Diagnosis not present

## 2016-02-03 DIAGNOSIS — N2581 Secondary hyperparathyroidism of renal origin: Secondary | ICD-10-CM | POA: Diagnosis not present

## 2016-02-05 DIAGNOSIS — Z23 Encounter for immunization: Secondary | ICD-10-CM | POA: Diagnosis not present

## 2016-02-05 DIAGNOSIS — D509 Iron deficiency anemia, unspecified: Secondary | ICD-10-CM | POA: Diagnosis not present

## 2016-02-05 DIAGNOSIS — N2581 Secondary hyperparathyroidism of renal origin: Secondary | ICD-10-CM | POA: Diagnosis not present

## 2016-02-05 DIAGNOSIS — N186 End stage renal disease: Secondary | ICD-10-CM | POA: Diagnosis not present

## 2016-02-05 DIAGNOSIS — T82598A Other mechanical complication of other cardiac and vascular devices and implants, initial encounter: Secondary | ICD-10-CM | POA: Diagnosis not present

## 2016-02-05 DIAGNOSIS — D631 Anemia in chronic kidney disease: Secondary | ICD-10-CM | POA: Diagnosis not present

## 2016-02-07 DIAGNOSIS — T82598A Other mechanical complication of other cardiac and vascular devices and implants, initial encounter: Secondary | ICD-10-CM | POA: Diagnosis not present

## 2016-02-07 DIAGNOSIS — D631 Anemia in chronic kidney disease: Secondary | ICD-10-CM | POA: Diagnosis not present

## 2016-02-07 DIAGNOSIS — N2581 Secondary hyperparathyroidism of renal origin: Secondary | ICD-10-CM | POA: Diagnosis not present

## 2016-02-07 DIAGNOSIS — Z23 Encounter for immunization: Secondary | ICD-10-CM | POA: Diagnosis not present

## 2016-02-07 DIAGNOSIS — N186 End stage renal disease: Secondary | ICD-10-CM | POA: Diagnosis not present

## 2016-02-07 DIAGNOSIS — D509 Iron deficiency anemia, unspecified: Secondary | ICD-10-CM | POA: Diagnosis not present

## 2016-02-10 DIAGNOSIS — N186 End stage renal disease: Secondary | ICD-10-CM | POA: Diagnosis not present

## 2016-02-10 DIAGNOSIS — D509 Iron deficiency anemia, unspecified: Secondary | ICD-10-CM | POA: Diagnosis not present

## 2016-02-10 DIAGNOSIS — T82598A Other mechanical complication of other cardiac and vascular devices and implants, initial encounter: Secondary | ICD-10-CM | POA: Diagnosis not present

## 2016-02-10 DIAGNOSIS — N2581 Secondary hyperparathyroidism of renal origin: Secondary | ICD-10-CM | POA: Diagnosis not present

## 2016-02-10 DIAGNOSIS — Z23 Encounter for immunization: Secondary | ICD-10-CM | POA: Diagnosis not present

## 2016-02-10 DIAGNOSIS — D631 Anemia in chronic kidney disease: Secondary | ICD-10-CM | POA: Diagnosis not present

## 2016-02-12 DIAGNOSIS — D631 Anemia in chronic kidney disease: Secondary | ICD-10-CM | POA: Diagnosis not present

## 2016-02-12 DIAGNOSIS — N186 End stage renal disease: Secondary | ICD-10-CM | POA: Diagnosis not present

## 2016-02-12 DIAGNOSIS — N2581 Secondary hyperparathyroidism of renal origin: Secondary | ICD-10-CM | POA: Diagnosis not present

## 2016-02-12 DIAGNOSIS — Z23 Encounter for immunization: Secondary | ICD-10-CM | POA: Diagnosis not present

## 2016-02-12 DIAGNOSIS — T82598A Other mechanical complication of other cardiac and vascular devices and implants, initial encounter: Secondary | ICD-10-CM | POA: Diagnosis not present

## 2016-02-12 DIAGNOSIS — D509 Iron deficiency anemia, unspecified: Secondary | ICD-10-CM | POA: Diagnosis not present

## 2016-02-14 DIAGNOSIS — N186 End stage renal disease: Secondary | ICD-10-CM | POA: Diagnosis not present

## 2016-02-14 DIAGNOSIS — D509 Iron deficiency anemia, unspecified: Secondary | ICD-10-CM | POA: Diagnosis not present

## 2016-02-14 DIAGNOSIS — D631 Anemia in chronic kidney disease: Secondary | ICD-10-CM | POA: Diagnosis not present

## 2016-02-14 DIAGNOSIS — N2581 Secondary hyperparathyroidism of renal origin: Secondary | ICD-10-CM | POA: Diagnosis not present

## 2016-02-14 DIAGNOSIS — Z23 Encounter for immunization: Secondary | ICD-10-CM | POA: Diagnosis not present

## 2016-02-14 DIAGNOSIS — T82598A Other mechanical complication of other cardiac and vascular devices and implants, initial encounter: Secondary | ICD-10-CM | POA: Diagnosis not present

## 2016-02-17 DIAGNOSIS — N186 End stage renal disease: Secondary | ICD-10-CM | POA: Diagnosis not present

## 2016-02-17 DIAGNOSIS — Z23 Encounter for immunization: Secondary | ICD-10-CM | POA: Diagnosis not present

## 2016-02-17 DIAGNOSIS — N2581 Secondary hyperparathyroidism of renal origin: Secondary | ICD-10-CM | POA: Diagnosis not present

## 2016-02-17 DIAGNOSIS — T82598A Other mechanical complication of other cardiac and vascular devices and implants, initial encounter: Secondary | ICD-10-CM | POA: Diagnosis not present

## 2016-02-17 DIAGNOSIS — D631 Anemia in chronic kidney disease: Secondary | ICD-10-CM | POA: Diagnosis not present

## 2016-02-17 DIAGNOSIS — D509 Iron deficiency anemia, unspecified: Secondary | ICD-10-CM | POA: Diagnosis not present

## 2016-02-19 DIAGNOSIS — D631 Anemia in chronic kidney disease: Secondary | ICD-10-CM | POA: Diagnosis not present

## 2016-02-19 DIAGNOSIS — T82598A Other mechanical complication of other cardiac and vascular devices and implants, initial encounter: Secondary | ICD-10-CM | POA: Diagnosis not present

## 2016-02-19 DIAGNOSIS — N186 End stage renal disease: Secondary | ICD-10-CM | POA: Diagnosis not present

## 2016-02-19 DIAGNOSIS — Z23 Encounter for immunization: Secondary | ICD-10-CM | POA: Diagnosis not present

## 2016-02-19 DIAGNOSIS — D509 Iron deficiency anemia, unspecified: Secondary | ICD-10-CM | POA: Diagnosis not present

## 2016-02-19 DIAGNOSIS — N2581 Secondary hyperparathyroidism of renal origin: Secondary | ICD-10-CM | POA: Diagnosis not present

## 2016-02-21 DIAGNOSIS — D631 Anemia in chronic kidney disease: Secondary | ICD-10-CM | POA: Diagnosis not present

## 2016-02-21 DIAGNOSIS — N2581 Secondary hyperparathyroidism of renal origin: Secondary | ICD-10-CM | POA: Diagnosis not present

## 2016-02-21 DIAGNOSIS — Z23 Encounter for immunization: Secondary | ICD-10-CM | POA: Diagnosis not present

## 2016-02-21 DIAGNOSIS — T82598A Other mechanical complication of other cardiac and vascular devices and implants, initial encounter: Secondary | ICD-10-CM | POA: Diagnosis not present

## 2016-02-21 DIAGNOSIS — N186 End stage renal disease: Secondary | ICD-10-CM | POA: Diagnosis not present

## 2016-02-21 DIAGNOSIS — D509 Iron deficiency anemia, unspecified: Secondary | ICD-10-CM | POA: Diagnosis not present

## 2016-02-24 DIAGNOSIS — N186 End stage renal disease: Secondary | ICD-10-CM | POA: Diagnosis not present

## 2016-02-24 DIAGNOSIS — D631 Anemia in chronic kidney disease: Secondary | ICD-10-CM | POA: Diagnosis not present

## 2016-02-24 DIAGNOSIS — T82598A Other mechanical complication of other cardiac and vascular devices and implants, initial encounter: Secondary | ICD-10-CM | POA: Diagnosis not present

## 2016-02-24 DIAGNOSIS — D509 Iron deficiency anemia, unspecified: Secondary | ICD-10-CM | POA: Diagnosis not present

## 2016-02-24 DIAGNOSIS — Z23 Encounter for immunization: Secondary | ICD-10-CM | POA: Diagnosis not present

## 2016-02-24 DIAGNOSIS — N2581 Secondary hyperparathyroidism of renal origin: Secondary | ICD-10-CM | POA: Diagnosis not present

## 2016-02-26 ENCOUNTER — Other Ambulatory Visit: Payer: Self-pay | Admitting: Family Medicine

## 2016-02-26 DIAGNOSIS — D631 Anemia in chronic kidney disease: Secondary | ICD-10-CM | POA: Diagnosis not present

## 2016-02-26 DIAGNOSIS — Z23 Encounter for immunization: Secondary | ICD-10-CM | POA: Diagnosis not present

## 2016-02-26 DIAGNOSIS — N2581 Secondary hyperparathyroidism of renal origin: Secondary | ICD-10-CM | POA: Diagnosis not present

## 2016-02-26 DIAGNOSIS — D509 Iron deficiency anemia, unspecified: Secondary | ICD-10-CM | POA: Diagnosis not present

## 2016-02-26 DIAGNOSIS — T82598A Other mechanical complication of other cardiac and vascular devices and implants, initial encounter: Secondary | ICD-10-CM | POA: Diagnosis not present

## 2016-02-26 DIAGNOSIS — B182 Chronic viral hepatitis C: Secondary | ICD-10-CM | POA: Diagnosis not present

## 2016-02-26 DIAGNOSIS — N186 End stage renal disease: Secondary | ICD-10-CM | POA: Diagnosis not present

## 2016-02-28 DIAGNOSIS — D631 Anemia in chronic kidney disease: Secondary | ICD-10-CM | POA: Diagnosis not present

## 2016-02-28 DIAGNOSIS — Z992 Dependence on renal dialysis: Secondary | ICD-10-CM | POA: Diagnosis not present

## 2016-02-28 DIAGNOSIS — I120 Hypertensive chronic kidney disease with stage 5 chronic kidney disease or end stage renal disease: Secondary | ICD-10-CM | POA: Diagnosis not present

## 2016-02-28 DIAGNOSIS — D509 Iron deficiency anemia, unspecified: Secondary | ICD-10-CM | POA: Diagnosis not present

## 2016-02-28 DIAGNOSIS — N186 End stage renal disease: Secondary | ICD-10-CM | POA: Diagnosis not present

## 2016-02-28 DIAGNOSIS — Z23 Encounter for immunization: Secondary | ICD-10-CM | POA: Diagnosis not present

## 2016-02-28 DIAGNOSIS — T82598A Other mechanical complication of other cardiac and vascular devices and implants, initial encounter: Secondary | ICD-10-CM | POA: Diagnosis not present

## 2016-02-28 DIAGNOSIS — N2581 Secondary hyperparathyroidism of renal origin: Secondary | ICD-10-CM | POA: Diagnosis not present

## 2016-03-02 DIAGNOSIS — D631 Anemia in chronic kidney disease: Secondary | ICD-10-CM | POA: Diagnosis not present

## 2016-03-02 DIAGNOSIS — N2581 Secondary hyperparathyroidism of renal origin: Secondary | ICD-10-CM | POA: Diagnosis not present

## 2016-03-02 DIAGNOSIS — D509 Iron deficiency anemia, unspecified: Secondary | ICD-10-CM | POA: Diagnosis not present

## 2016-03-02 DIAGNOSIS — N186 End stage renal disease: Secondary | ICD-10-CM | POA: Diagnosis not present

## 2016-03-04 DIAGNOSIS — D631 Anemia in chronic kidney disease: Secondary | ICD-10-CM | POA: Diagnosis not present

## 2016-03-04 DIAGNOSIS — N186 End stage renal disease: Secondary | ICD-10-CM | POA: Diagnosis not present

## 2016-03-04 DIAGNOSIS — N2581 Secondary hyperparathyroidism of renal origin: Secondary | ICD-10-CM | POA: Diagnosis not present

## 2016-03-04 DIAGNOSIS — D509 Iron deficiency anemia, unspecified: Secondary | ICD-10-CM | POA: Diagnosis not present

## 2016-03-05 MED FILL — PANTOPRAZOLE SOD DR 40 MG T: 40 | 30 days supply | Qty: 60 | Fill #0

## 2016-03-07 ENCOUNTER — Encounter (HOSPITAL_COMMUNITY): Payer: Self-pay | Admitting: Emergency Medicine

## 2016-03-07 ENCOUNTER — Emergency Department (HOSPITAL_COMMUNITY): Payer: Medicare Other

## 2016-03-07 ENCOUNTER — Emergency Department (HOSPITAL_COMMUNITY)
Admission: EM | Admit: 2016-03-07 | Discharge: 2016-03-07 | Disposition: A | Discharge status: Home / Self Care | Payer: Medicare Other | Attending: Emergency Medicine | Admitting: Emergency Medicine

## 2016-03-07 DIAGNOSIS — J45901 Unspecified asthma with (acute) exacerbation: Secondary | ICD-10-CM | POA: Diagnosis not present

## 2016-03-07 DIAGNOSIS — D649 Anemia, unspecified: Secondary | ICD-10-CM | POA: Insufficient documentation

## 2016-03-07 DIAGNOSIS — I120 Hypertensive chronic kidney disease with stage 5 chronic kidney disease or end stage renal disease: Secondary | ICD-10-CM | POA: Diagnosis not present

## 2016-03-07 DIAGNOSIS — Z992 Dependence on renal dialysis: Secondary | ICD-10-CM | POA: Diagnosis not present

## 2016-03-07 DIAGNOSIS — N186 End stage renal disease: Secondary | ICD-10-CM | POA: Diagnosis not present

## 2016-03-07 DIAGNOSIS — I517 Cardiomegaly: Secondary | ICD-10-CM | POA: Diagnosis not present

## 2016-03-07 DIAGNOSIS — R0602 Shortness of breath: Secondary | ICD-10-CM | POA: Diagnosis not present

## 2016-03-07 DIAGNOSIS — F17210 Nicotine dependence, cigarettes, uncomplicated: Secondary | ICD-10-CM | POA: Insufficient documentation

## 2016-03-07 LAB — CBC WITH DIFFERENTIAL/PLATELET
Basophils Absolute: 0 10*3/uL (ref 0.0–0.1)
Basophils Relative: 1 %
Eosinophils Absolute: 0.3 10*3/uL (ref 0.0–0.7)
Eosinophils Relative: 4 %
HCT: 28.5 % — ABNORMAL LOW (ref 36.0–46.0)
Hemoglobin: 9.3 g/dL — ABNORMAL LOW (ref 12.0–15.0)
Lymphocytes Relative: 25 %
Lymphs Abs: 1.7 10*3/uL (ref 0.7–4.0)
MCH: 30.9 pg (ref 26.0–34.0)
MCHC: 32.6 g/dL (ref 30.0–36.0)
MCV: 94.7 fL (ref 78.0–100.0)
Monocytes Absolute: 0.3 10*3/uL (ref 0.1–1.0)
Monocytes Relative: 4 %
Neutro Abs: 4.3 10*3/uL (ref 1.7–7.7)
Neutrophils Relative %: 66 %
Platelets: 160 10*3/uL (ref 150–400)
RBC: 3.01 MIL/uL — ABNORMAL LOW (ref 3.87–5.11)
RDW: 15 % (ref 11.5–15.5)
WBC: 6.5 10*3/uL (ref 4.0–10.5)

## 2016-03-07 LAB — BASIC METABOLIC PANEL
Anion gap: 13 (ref 5–15)
BUN: 43 mg/dL — ABNORMAL HIGH (ref 6–20)
CO2: 20 mmol/L — ABNORMAL LOW (ref 22–32)
Calcium: 8.3 mg/dL — ABNORMAL LOW (ref 8.9–10.3)
Chloride: 102 mmol/L (ref 101–111)
Creatinine, Ser: 10.28 mg/dL — ABNORMAL HIGH (ref 0.44–1.00)
GFR calc Af Amer: 4 mL/min — ABNORMAL LOW (ref >60)
GFR calc non Af Amer: 4 mL/min — ABNORMAL LOW (ref >60)
Glucose, Bld: 93 mg/dL (ref 65–99)
Potassium: 5 mmol/L (ref 3.5–5.1)
Sodium: 135 mmol/L (ref 135–145)

## 2016-03-07 MED ORDER — PREDNISONE 20 MG PO TABS
60.0000 mg | ORAL_TABLET | Freq: Once | ORAL | Status: AC
  Administered 2016-03-07: 60 mg via ORAL
  Filled 2016-03-07: qty 3

## 2016-03-07 MED ORDER — ALBUTEROL SULFATE HFA 108 (90 BASE) MCG/ACT IN AERS
1.0000 | INHALATION_SPRAY | Freq: Once | RESPIRATORY_TRACT | Status: AC
  Administered 2016-03-07: 2 via RESPIRATORY_TRACT
  Filled 2016-03-07: qty 6.7

## 2016-03-07 MED ORDER — IPRATROPIUM-ALBUTEROL 0.5-2.5 (3) MG/3ML IN SOLN
3.0000 mL | Freq: Once | RESPIRATORY_TRACT | Status: AC
  Administered 2016-03-07: 3 mL via RESPIRATORY_TRACT
  Filled 2016-03-07: qty 3

## 2016-03-07 MED ORDER — PREDNISONE 20 MG PO TABS: 60.0000 mg | ORAL_TABLET | Freq: Every day | ORAL | 0 refills | Status: DC

## 2016-03-07 NOTE — ED Provider Notes (Signed)
[facility] DEPT Provider Note   CSN: [id number] Arrival date & time: 03/07/16  1631     History   Chief Complaint Chief Complaint  Patient presents with  . Shortness of Breath    HPI Karen Morrow is a 55 y.o. female with history of chronic kidney disease on dialysis, asthma who presents with a one-day history of shortness of breath that is worse on exertion. Patient missed her dialysis yesterday and began feeling shortness of breath last evening. She was unable to sleep last night because of her shortness of breath and has gotten progressively worse throughout today. She denies any chest pain. Patient states that she feels a lot better after EMS gave her breathing treatment. Patient states she has felt like this in the past when she has missed dialysis. Patient denies any abdominal pain, nausea, vomiting, urinary symptoms. Patient's dialysis schedule is Tuesday, Thursday, Saturday. He did not take any medications outside of her normal regimen for this.  HPI  Past Medical History:  Diagnosis Date  . Anginal pain ([place])   . Asthma   . Chronic kidney disease    dialysis 3x wk  . Hepatitis C antibody test positive   . Hypertension    "just dx'd today" (11/03/2012)    Patient Active Problem List   Diagnosis Date Noted  . Pruritus 12/11/2014  . Hordeolum externum (stye) 12/11/2014  . Xanthelasma of right upper eyelid 12/11/2014  . Healthcare maintenance 10/30/2014  . Dermatitis 10/30/2014  . End stage renal disease on dialysis ([place]) 02/25/2014  . Malnutrition of moderate degree ([place]) 11/28/2013  . HTN (hypertension) 11/26/2013  . Gastric ulcer 06/07/2013  . Anemia of chronic disease 11/03/2012  . Thrombocytopenia ([place]) 11/03/2012  . Elevated transaminase level 11/03/2012  . 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 Hepatitis C antibody test positive     Past Surgical History:  Procedure Laterality Date  . ABDOMINAL  HYSTERECTOMY     "partial" (11/03/2012)  . AV FISTULA PLACEMENT Right 12/03/2013   Procedure: CREATION OF ARTERIOVENOUS (AV) FISTULA RIGHT ARM ;  Surgeon: Rosetta Posner, MD;  Location: [facility];  Service: Vascular;  Laterality: Right;  . BASCILIC VEIN TRANSPOSITION Right 03/06/2014   Procedure: SECOND STAGE BASILIC VEIN TRANSPOSITION;  Surgeon: Rosetta Posner, MD;  Location: [facility] OR;  Service: Vascular;  Laterality: Right;  . ESOPHAGOGASTRODUODENOSCOPY N/A 06/05/2013   Procedure: ESOPHAGOGASTRODUODENOSCOPY (EGD);  Surgeon: Winfield Cunas., MD;  Location: [facility];  Service: Endoscopy;  Laterality: N/A;    OB History    No data available       Home Medications    Prior to Admission medications   Medication Sig Start Date End Date Taking? Authorizing Provider  amLODipine (NORVASC) 2.5 MG tablet Take 1 tablet (2.5 mg total) by mouth daily. 12/11/14   Boykin Nearing, MD  hydrOXYzine (VISTARIL) 25 MG capsule Take 1 capsule (25 mg total) by mouth 3 (three) times daily as needed for itching. 12/11/14   Josalyn Funches, MD  metoprolol (LOPRESSOR) 50 MG tablet Take 1 tablet (50 mg total) by mouth 2 (two) times daily. 10/30/14   Josalyn Funches, MD  pantoprazole (PROTONIX) 40 MG tablet TAKE 1 TABLET BY MOUTH 2 TIMES DAILY 02/26/16   Boykin Nearing, MD  predniSONE (DELTASONE) 20 MG tablet Take 3 tablets (60 mg total) by mouth daily. 03/07/16   Frederica Kuster, PA-C  sevelamer carbonate (RENVELA) 800 MG tablet Take 3 tablets (2,400 mg total)  by mouth 3 (three) times daily with meals. 10/30/14   Boykin Nearing, MD    Family History Family History  Problem Relation Age of Onset  . Lupus Mother   . Diabetes Father   . Heart attack Father     Social History Social History  Substance Use Topics  . Smoking status: Light Tobacco Smoker    Packs/day: 0.50    Years: 20.00    Types: Cigarettes  . Smokeless tobacco: Never Used  . Alcohol use No     Comment: 11/03/2012 "1-2 40oz beers/day maybe"   11/17/15 "I  don;t drink beer anymore since Ive been on dialysis"     Allergies   Cefazolin   Review of Systems Review of Systems  Constitutional: Negative for chills and fever.  HENT: Negative for facial swelling and sore throat.   Respiratory: Positive for shortness of breath. Negative for cough.   Cardiovascular: Negative for chest pain.  Gastrointestinal: Negative for abdominal pain, nausea and vomiting.  Genitourinary: Negative for dysuria.  Musculoskeletal: Negative for back pain.  Skin: Negative for rash and wound.  Neurological: Negative for headaches.  Psychiatric/Behavioral: The patient is not nervous/anxious.      Physical Exam Updated Vital Signs BP 167/96   Pulse 80   Temp 97.8 F (36.6 C) (Oral)   Resp 14   Ht 5\' 7"  (1.702 m)   Wt 61.2 kg   SpO2 98%   BMI 21.14 kg/m   Physical Exam  Constitutional: She appears well-developed and well-nourished. No distress.  HENT:  Head: Normocephalic and atraumatic.  Mouth/Throat: Oropharynx is clear and moist. No oropharyngeal exudate.  Eyes: Conjunctivae are normal. Pupils are equal, round, and reactive to light. Right eye exhibits no discharge. Left eye exhibits no discharge. No scleral icterus.  Neck: Normal range of motion. Neck supple. No thyromegaly present.  Cardiovascular: Normal rate, regular rhythm, normal heart sounds and intact distal pulses.  Exam reveals no gallop and no friction rub.   No murmur heard. Pulmonary/Chest: Effort normal. No stridor. No respiratory distress. She has wheezes (diffuse expiratory). She has no rales.  Abdominal: Soft. Bowel sounds are normal. She exhibits no distension. There is no tenderness. There is no rebound and no guarding.  Musculoskeletal: She exhibits no edema.  Lymphadenopathy:    She has no cervical adenopathy.  Neurological: She is alert. Coordination normal.  Skin: Skin is warm and dry. No rash noted. She is not diaphoretic. No pallor.  Psychiatric: She has a normal mood and  affect.  Nursing note and vitals reviewed.    ED Treatments / Results  Labs (all labs ordered are listed, but only abnormal results are displayed) Labs Reviewed  CBC WITH DIFFERENTIAL/PLATELET - Abnormal; Notable for the following:       Result Value   RBC 3.01 (*)    Hemoglobin 9.3 (*)    HCT 28.5 (*)    All other components within normal limits  BASIC METABOLIC PANEL - Abnormal; Notable for the following:    CO2 20 (*)    BUN 43 (*)    Creatinine, Ser 10.28 (*)    Calcium 8.3 (*)    GFR calc non Af Amer 4 (*)    GFR calc Af Amer 4 (*)    All other components within normal limits    EKG  EKG Interpretation  Date/Time:  Sunday March 07 2016 16:43:57 EDT Ventricular Rate:  83 PR Interval:    QRS Duration: 86 QT Interval:  415 QTC Calculation:  63 R Axis:   -8 Text Interpretation:  Sinus rhythm Probable left atrial enlargement RSR' in V1 or V2, right VCD or RVH LVH with secondary repolarization abnormality Borderline prolonged QT interval No significant change was found Confirmed by CAMPOS  MD, Lennette Bihari ([id number]) on 03/07/2016 7:23:38 PM       Radiology Dg Chest 2 View  Result Date: 03/07/2016 CLINICAL DATA:  Shortness of breath.  On dialysis. EXAM: CHEST  2 VIEW COMPARISON:  11/17/2015 FINDINGS: The cardiac silhouette remains enlarged, slightly more prominent than on the prior study. Aortic atherosclerosis is noted. Small bilateral pleural effusions are again seen, right larger than left and similar to the prior study. There is mild pulmonary vascular congestion. Mildly prominent interstitial markings compared to the prior study, mainly in the lung bases. Asymmetric patchy opacity in the right lung base slightly increased from prior. No pneumothorax. No acute osseous abnormality. IMPRESSION: 1. Cardiomegaly with pulmonary vascular congestion and possible early interstitial edema. 2. Small bilateral pleural effusions, right larger than left. 3. Asymmetric right basilar opacity  which may reflect atelectasis versus infection. Electronically Signed   By: Logan Bores M.D.   On: 03/07/2016 17:45    Procedures Procedures (including critical care time)  Medications Ordered in ED Medications  albuterol (PROVENTIL HFA;VENTOLIN HFA) 108 (90 Base) MCG/ACT inhaler 1-2 puff (not administered)  ipratropium-albuterol (DUONEB) 0.5-2.5 (3) MG/3ML nebulizer solution 3 mL (3 mLs Nebulization Given 03/07/16 1839)  predniSONE (DELTASONE) tablet 60 mg (60 mg Oral Given 03/07/16 1948)     Initial Impression / Assessment and Plan / ED Course  I have reviewed the triage vital signs and the nursing notes.  Pertinent labs & imaging results that were available during my care of the patient were reviewed by me and considered in my medical decision making (see chart for details).  Clinical Course    CBC, BMP, CXR ordered. DuoNeb ordered and plan for reevaluation following.  Patient feeling back to baseline after DuoNeb. Wheezes have resolved, repeat lung exam clear. CBC shows stable chronic anemia, hemoglobin 9.3. BMP shows stable electrolytes, including potassium, creatinine 10.28, calcium 8.3. CXR shows cardiomegaly with pulmonary vascular congestion and possible early interstitial edema; small bilateral pleural effusions, right larger than left; asymmetric right insular opacity which may reflect atelectasis versus infection. EKG shows Sinus rhythm, Probable left atrial enlargement RSR' in V1 or V2, right VCD or RVH LVH with secondary repolarization abnormality, Borderline prolonged QT interval. No significant change was found. Will discharge patient home with 5 day burst of prednisone and albuterol inhaler for most likely asthma exacerbation. Patient advised to go to her dialysis appointment on Tuesday. Patient also advised to follow-up with PCP this week. Patient understands and agrees with plan. Patient vitals stable throughout ED course and discharged in satisfactory condition. Patient  also evaluated by Dr. Venora Maples who agrees with plan.  Final Clinical Impressions(s) / ED Diagnoses   Final diagnoses:  Exacerbation of asthma, unspecified asthma severity, unspecified whether persistent    New Prescriptions New Prescriptions   PREDNISONE (DELTASONE) 20 MG TABLET    Take 3 tablets (60 mg total) by mouth daily.     Frederica Kuster, PA-C 03/07/16 [place], MD 03/08/16 [phone]

## 2016-03-07 NOTE — ED Notes (Signed)
Patient is finished with breathing treatment; patient stated "so I can go now? Because that's all I needed was that (pointing at breathing treatment apparatus)"; informed patient I would let her RN know that she was finished with her breathing treatment and she would inform the MD and we would go from there; patient stated "ok, well I know I don't need to be admitted. So ok. Thank you"; informed Maudie Mercury, RN and [place], [place]

## 2016-03-07 NOTE — ED Notes (Signed)
Patient physically moved from the hallway into room E46 now; patient undressed, in gown, on monitor, continuous pulse oximetry and blood pressure cuff

## 2016-03-07 NOTE — ED Triage Notes (Signed)
Pt st's she has been short of breath since yesterday.  St's did not go to dialysis yesterday because she wanted to go to home coming to get something to eat.  Pt st's she is going to dialysis tomorrow

## 2016-03-07 NOTE — ED Notes (Signed)
Pt to xray at this time.

## 2016-03-07 NOTE — ED Notes (Signed)
Pt st's she needs to be discharged because her sister is coming to get her.  Dr. Venora Maples at bedside to explain need for her to stay.  Pt voices understanding

## 2016-03-07 NOTE — Discharge Instructions (Signed)
Medications: Prednisone, albuterol inhaler  Treatment: Take prednisone for the next 5 days. Use albuterol inhaler every 4-6 hours as needed for shortness of breath. Make sure to go to dialysis appointment on Tuesday.  Follow-up: Please call your doctor for follow-up of today's visit and further evaluation and treatment of your symptoms. Please return to emergency department if you develop any new or worsening symptoms.

## 2016-03-09 DIAGNOSIS — D631 Anemia in chronic kidney disease: Secondary | ICD-10-CM | POA: Diagnosis not present

## 2016-03-09 DIAGNOSIS — N186 End stage renal disease: Secondary | ICD-10-CM | POA: Diagnosis not present

## 2016-03-09 DIAGNOSIS — D509 Iron deficiency anemia, unspecified: Secondary | ICD-10-CM | POA: Diagnosis not present

## 2016-03-09 DIAGNOSIS — N2581 Secondary hyperparathyroidism of renal origin: Secondary | ICD-10-CM | POA: Diagnosis not present

## 2016-03-11 DIAGNOSIS — N2581 Secondary hyperparathyroidism of renal origin: Secondary | ICD-10-CM | POA: Diagnosis not present

## 2016-03-11 DIAGNOSIS — N186 End stage renal disease: Secondary | ICD-10-CM | POA: Diagnosis not present

## 2016-03-11 DIAGNOSIS — D509 Iron deficiency anemia, unspecified: Secondary | ICD-10-CM | POA: Diagnosis not present

## 2016-03-11 DIAGNOSIS — D631 Anemia in chronic kidney disease: Secondary | ICD-10-CM | POA: Diagnosis not present

## 2016-03-13 DIAGNOSIS — N186 End stage renal disease: Secondary | ICD-10-CM | POA: Diagnosis not present

## 2016-03-13 DIAGNOSIS — D631 Anemia in chronic kidney disease: Secondary | ICD-10-CM | POA: Diagnosis not present

## 2016-03-13 DIAGNOSIS — N2581 Secondary hyperparathyroidism of renal origin: Secondary | ICD-10-CM | POA: Diagnosis not present

## 2016-03-13 DIAGNOSIS — D509 Iron deficiency anemia, unspecified: Secondary | ICD-10-CM | POA: Diagnosis not present

## 2016-03-16 DIAGNOSIS — N186 End stage renal disease: Secondary | ICD-10-CM | POA: Diagnosis not present

## 2016-03-16 DIAGNOSIS — N2581 Secondary hyperparathyroidism of renal origin: Secondary | ICD-10-CM | POA: Diagnosis not present

## 2016-03-16 DIAGNOSIS — D509 Iron deficiency anemia, unspecified: Secondary | ICD-10-CM | POA: Diagnosis not present

## 2016-03-16 DIAGNOSIS — D631 Anemia in chronic kidney disease: Secondary | ICD-10-CM | POA: Diagnosis not present

## 2016-03-18 DIAGNOSIS — D509 Iron deficiency anemia, unspecified: Secondary | ICD-10-CM | POA: Diagnosis not present

## 2016-03-18 DIAGNOSIS — N2581 Secondary hyperparathyroidism of renal origin: Secondary | ICD-10-CM | POA: Diagnosis not present

## 2016-03-18 DIAGNOSIS — D631 Anemia in chronic kidney disease: Secondary | ICD-10-CM | POA: Diagnosis not present

## 2016-03-18 DIAGNOSIS — N186 End stage renal disease: Secondary | ICD-10-CM | POA: Diagnosis not present

## 2016-03-20 DIAGNOSIS — N186 End stage renal disease: Secondary | ICD-10-CM | POA: Diagnosis not present

## 2016-03-20 DIAGNOSIS — D509 Iron deficiency anemia, unspecified: Secondary | ICD-10-CM | POA: Diagnosis not present

## 2016-03-20 DIAGNOSIS — D631 Anemia in chronic kidney disease: Secondary | ICD-10-CM | POA: Diagnosis not present

## 2016-03-20 DIAGNOSIS — N2581 Secondary hyperparathyroidism of renal origin: Secondary | ICD-10-CM | POA: Diagnosis not present

## 2016-03-23 DIAGNOSIS — D509 Iron deficiency anemia, unspecified: Secondary | ICD-10-CM | POA: Diagnosis not present

## 2016-03-23 DIAGNOSIS — N186 End stage renal disease: Secondary | ICD-10-CM | POA: Diagnosis not present

## 2016-03-23 DIAGNOSIS — N2581 Secondary hyperparathyroidism of renal origin: Secondary | ICD-10-CM | POA: Diagnosis not present

## 2016-03-23 DIAGNOSIS — D631 Anemia in chronic kidney disease: Secondary | ICD-10-CM | POA: Diagnosis not present

## 2016-03-25 DIAGNOSIS — N2581 Secondary hyperparathyroidism of renal origin: Secondary | ICD-10-CM | POA: Diagnosis not present

## 2016-03-25 DIAGNOSIS — D631 Anemia in chronic kidney disease: Secondary | ICD-10-CM | POA: Diagnosis not present

## 2016-03-25 DIAGNOSIS — D509 Iron deficiency anemia, unspecified: Secondary | ICD-10-CM | POA: Diagnosis not present

## 2016-03-25 DIAGNOSIS — N186 End stage renal disease: Secondary | ICD-10-CM | POA: Diagnosis not present

## 2016-03-27 DIAGNOSIS — D631 Anemia in chronic kidney disease: Secondary | ICD-10-CM | POA: Diagnosis not present

## 2016-03-27 DIAGNOSIS — N2581 Secondary hyperparathyroidism of renal origin: Secondary | ICD-10-CM | POA: Diagnosis not present

## 2016-03-27 DIAGNOSIS — N186 End stage renal disease: Secondary | ICD-10-CM | POA: Diagnosis not present

## 2016-03-27 DIAGNOSIS — D509 Iron deficiency anemia, unspecified: Secondary | ICD-10-CM | POA: Diagnosis not present

## 2016-03-30 DIAGNOSIS — N2581 Secondary hyperparathyroidism of renal origin: Secondary | ICD-10-CM | POA: Diagnosis not present

## 2016-03-30 DIAGNOSIS — I120 Hypertensive chronic kidney disease with stage 5 chronic kidney disease or end stage renal disease: Secondary | ICD-10-CM | POA: Diagnosis not present

## 2016-03-30 DIAGNOSIS — D509 Iron deficiency anemia, unspecified: Secondary | ICD-10-CM | POA: Diagnosis not present

## 2016-03-30 DIAGNOSIS — N186 End stage renal disease: Secondary | ICD-10-CM | POA: Diagnosis not present

## 2016-03-30 DIAGNOSIS — Z992 Dependence on renal dialysis: Secondary | ICD-10-CM | POA: Diagnosis not present

## 2016-03-30 DIAGNOSIS — D631 Anemia in chronic kidney disease: Secondary | ICD-10-CM | POA: Diagnosis not present

## 2016-04-01 DIAGNOSIS — N186 End stage renal disease: Secondary | ICD-10-CM | POA: Diagnosis not present

## 2016-04-01 DIAGNOSIS — N2581 Secondary hyperparathyroidism of renal origin: Secondary | ICD-10-CM | POA: Diagnosis not present

## 2016-04-01 DIAGNOSIS — D631 Anemia in chronic kidney disease: Secondary | ICD-10-CM | POA: Diagnosis not present

## 2016-04-01 DIAGNOSIS — D509 Iron deficiency anemia, unspecified: Secondary | ICD-10-CM | POA: Diagnosis not present

## 2016-04-03 DIAGNOSIS — D631 Anemia in chronic kidney disease: Secondary | ICD-10-CM | POA: Diagnosis not present

## 2016-04-03 DIAGNOSIS — D509 Iron deficiency anemia, unspecified: Secondary | ICD-10-CM | POA: Diagnosis not present

## 2016-04-03 DIAGNOSIS — N186 End stage renal disease: Secondary | ICD-10-CM | POA: Diagnosis not present

## 2016-04-03 DIAGNOSIS — N2581 Secondary hyperparathyroidism of renal origin: Secondary | ICD-10-CM | POA: Diagnosis not present

## 2016-04-05 ENCOUNTER — Other Ambulatory Visit: Payer: Self-pay | Admitting: Family Medicine

## 2016-04-06 DIAGNOSIS — D631 Anemia in chronic kidney disease: Secondary | ICD-10-CM | POA: Diagnosis not present

## 2016-04-06 DIAGNOSIS — N186 End stage renal disease: Secondary | ICD-10-CM | POA: Diagnosis not present

## 2016-04-06 DIAGNOSIS — D509 Iron deficiency anemia, unspecified: Secondary | ICD-10-CM | POA: Diagnosis not present

## 2016-04-06 DIAGNOSIS — N2581 Secondary hyperparathyroidism of renal origin: Secondary | ICD-10-CM | POA: Diagnosis not present

## 2016-04-08 DIAGNOSIS — D509 Iron deficiency anemia, unspecified: Secondary | ICD-10-CM | POA: Diagnosis not present

## 2016-04-08 DIAGNOSIS — N186 End stage renal disease: Secondary | ICD-10-CM | POA: Diagnosis not present

## 2016-04-08 DIAGNOSIS — D631 Anemia in chronic kidney disease: Secondary | ICD-10-CM | POA: Diagnosis not present

## 2016-04-08 DIAGNOSIS — N2581 Secondary hyperparathyroidism of renal origin: Secondary | ICD-10-CM | POA: Diagnosis not present

## 2016-04-10 DIAGNOSIS — N2581 Secondary hyperparathyroidism of renal origin: Secondary | ICD-10-CM | POA: Diagnosis not present

## 2016-04-10 DIAGNOSIS — N186 End stage renal disease: Secondary | ICD-10-CM | POA: Diagnosis not present

## 2016-04-10 DIAGNOSIS — D509 Iron deficiency anemia, unspecified: Secondary | ICD-10-CM | POA: Diagnosis not present

## 2016-04-10 DIAGNOSIS — D631 Anemia in chronic kidney disease: Secondary | ICD-10-CM | POA: Diagnosis not present

## 2016-04-13 DIAGNOSIS — D509 Iron deficiency anemia, unspecified: Secondary | ICD-10-CM | POA: Diagnosis not present

## 2016-04-13 DIAGNOSIS — N186 End stage renal disease: Secondary | ICD-10-CM | POA: Diagnosis not present

## 2016-04-13 DIAGNOSIS — N2581 Secondary hyperparathyroidism of renal origin: Secondary | ICD-10-CM | POA: Diagnosis not present

## 2016-04-13 DIAGNOSIS — D631 Anemia in chronic kidney disease: Secondary | ICD-10-CM | POA: Diagnosis not present

## 2016-04-15 DIAGNOSIS — N186 End stage renal disease: Secondary | ICD-10-CM | POA: Diagnosis not present

## 2016-04-15 DIAGNOSIS — N2581 Secondary hyperparathyroidism of renal origin: Secondary | ICD-10-CM | POA: Diagnosis not present

## 2016-04-15 DIAGNOSIS — D631 Anemia in chronic kidney disease: Secondary | ICD-10-CM | POA: Diagnosis not present

## 2016-04-15 DIAGNOSIS — D509 Iron deficiency anemia, unspecified: Secondary | ICD-10-CM | POA: Diagnosis not present

## 2016-04-16 ENCOUNTER — Encounter: Payer: Self-pay | Admitting: Family Medicine

## 2016-04-16 ENCOUNTER — Other Ambulatory Visit: Payer: Self-pay | Admitting: Family Medicine

## 2016-04-16 ENCOUNTER — Ambulatory Visit: Payer: Medicare Other | Attending: Family Medicine | Admitting: Family Medicine

## 2016-04-16 VITALS — BP 153/86 | HR 80 | Temp 98.1°F | Ht 67.0 in | Wt 135.8 lb

## 2016-04-16 DIAGNOSIS — R0602 Shortness of breath: Secondary | ICD-10-CM

## 2016-04-16 DIAGNOSIS — D631 Anemia in chronic kidney disease: Secondary | ICD-10-CM | POA: Diagnosis not present

## 2016-04-16 DIAGNOSIS — K219 Gastro-esophageal reflux disease without esophagitis: Secondary | ICD-10-CM | POA: Diagnosis not present

## 2016-04-16 DIAGNOSIS — Z79899 Other long term (current) drug therapy: Secondary | ICD-10-CM | POA: Diagnosis not present

## 2016-04-16 DIAGNOSIS — Z1231 Encounter for screening mammogram for malignant neoplasm of breast: Secondary | ICD-10-CM | POA: Diagnosis not present

## 2016-04-16 DIAGNOSIS — I120 Hypertensive chronic kidney disease with stage 5 chronic kidney disease or end stage renal disease: Secondary | ICD-10-CM | POA: Insufficient documentation

## 2016-04-16 DIAGNOSIS — Z0000 Encounter for general adult medical examination without abnormal findings: Secondary | ICD-10-CM | POA: Diagnosis not present

## 2016-04-16 DIAGNOSIS — F5101 Primary insomnia: Secondary | ICD-10-CM

## 2016-04-16 DIAGNOSIS — I10 Essential (primary) hypertension: Secondary | ICD-10-CM

## 2016-04-16 DIAGNOSIS — F17210 Nicotine dependence, cigarettes, uncomplicated: Secondary | ICD-10-CM | POA: Diagnosis not present

## 2016-04-16 DIAGNOSIS — N186 End stage renal disease: Secondary | ICD-10-CM

## 2016-04-16 DIAGNOSIS — Z992 Dependence on renal dialysis: Secondary | ICD-10-CM | POA: Diagnosis not present

## 2016-04-16 LAB — FERRITIN: Ferritin: 894 ng/mL — ABNORMAL HIGH (ref 10–232)

## 2016-04-16 MED ORDER — PANTOPRAZOLE SODIUM 20 MG PO TBEC: 40.0000 mg | DELAYED_RELEASE_TABLET | Freq: Two times a day (BID) | ORAL | 11 refills | Status: DC

## 2016-04-16 MED ORDER — MIRTAZAPINE 15 MG PO TABS: 15.0000 mg | ORAL_TABLET | Freq: Every day | ORAL | 2 refills | Status: DC

## 2016-04-16 MED ORDER — PANTOPRAZOLE SODIUM 20 MG PO TBEC: 20.0000 mg | DELAYED_RELEASE_TABLET | Freq: Two times a day (BID) | ORAL | 11 refills | Status: DC

## 2016-04-16 MED ORDER — ALBUTEROL SULFATE (2.5 MG/3ML) 0.083% IN NEBU: 2.5000 mg | INHALATION_SOLUTION | Freq: Four times a day (QID) | RESPIRATORY_TRACT | 3 refills | Status: DC | PRN

## 2016-04-16 MED ORDER — HYDRALAZINE HCL 10 MG PO TABS: 10.0000 mg | ORAL_TABLET | Freq: Three times a day (TID) | ORAL | 3 refills | Status: DC

## 2016-04-16 NOTE — Assessment & Plan Note (Signed)
Insomnia with moderate depression and weight loss  Add remeron

## 2016-04-16 NOTE — Patient Instructions (Addendum)
Karen Morrow was seen today for shortness of breath.  Diagnoses and all orders for this visit:  Shortness of breath -     Pulmonary function test; Future -     albuterol (PROVENTIL) (2.5 MG/3ML) 0.083% nebulizer solution; Take 3 mLs (2.5 mg total) by nebulization every 6 (six) hours as needed for wheezing or shortness of breath. -     Brain natriuretic peptide -     ECHOCARDIOGRAM COMPLETE; Future  Primary insomnia -     mirtazapine (REMERON) 15 MG tablet; Take 1 tablet (15 mg total) by mouth at bedtime.  Healthcare maintenance -     Ambulatory referral to Gastroenterology  Visit for screening mammogram -     MM DIGITAL SCREENING BILATERAL; Future  Anemia in chronic kidney disease, on chronic dialysis (HCC) -     Iron and TIBC -     Ferritin  Gastroesophageal reflux disease, esophagitis presence not specified -     Discontinue: pantoprazole (PROTONIX) 20 MG tablet; Take 2 tablets (40 mg total) by mouth 2 (two) times daily. -     pantoprazole (PROTONIX) 20 MG tablet; Take 1 tablet (20 mg total) by mouth 2 (two) times daily.  Essential hypertension -     hydrALAZINE (APRESOLINE) 10 MG tablet; Take 1 tablet (10 mg total) by mouth 3 (three) times daily.   Stop norvasc for HTN Start hydralazine 10 mg three times a day for HTN   refilled protonix, dose decreased to maintenance dose of 20 mg twice daily   F/u in 4 weeks for insomnia, HTN and shortness of breath   Dr. Adrian Blackwater

## 2016-04-16 NOTE — Assessment & Plan Note (Addendum)
SOB in setting of HTN, smoking, CKD Suspect COPD with possible CHF  Plan: Continue HD for volume control Treat HTN with hydralazine Neb machine provided Albuterol neb solution  ECHO PFTs Consider low dose CT chest

## 2016-04-16 NOTE — Assessment & Plan Note (Signed)
HTN on norvasc  Plan: D/c norvasc Start hydralazine 10 mg TID

## 2016-04-16 NOTE — Progress Notes (Signed)
Pt is here today for SOB. Pt states this has been going on for a couple of weeks. Pt is having trouble sleeping.  Pt already has flu shot.

## 2016-04-16 NOTE — Progress Notes (Signed)
Subjective:  Patient ID: Karen Morrow, female    DOB: [birth year]  Age: 55 y.o. MRN: [id number]  CC: Shortness of Breath   HPI Karen Morrow has HTN, CKD on dialysis, current smoker she presents for   1. Shortness of breath:  X 2-3 weeks. Shortness of breath with exertion. Frequent cough. Has sore throat. She is a current smoking. Smoking for past 24 years. Previously smoked 1 PPD, now smokes 1/2 PPD for past 2 months. Has unintentional weight loss. No fever or chills. She has been to the ED twice in the last 6 months for shortness of breath. She has also called EMS a few times for neb treatments.  DG chest 03/07/2016: IMPRESSION: 1. Cardiomegaly with pulmonary vascular congestion and possible early interstitial edema. 2. Small bilateral pleural effusions, right larger than left. 3. Asymmetric right basilar opacity which may reflect atelectasis versus infection.   Social History  Substance Use Topics  . Smoking status: Light Tobacco Smoker    Packs/day: 0.50    Years: 20.00    Types: Cigarettes  . Smokeless tobacco: Never Used  . Alcohol use No     Comment: 11/03/2012 "1-2 40oz beers/day maybe"   11/17/15 "I don;t drink beer anymore since Ive been on dialysis"    Outpatient Medications Prior to Visit  Medication Sig Dispense Refill  . amLODipine (NORVASC) 2.5 MG tablet Take 1 tablet (2.5 mg total) by mouth daily. 30 tablet 5  . hydrOXYzine (VISTARIL) 25 MG capsule Take 1 capsule (25 mg total) by mouth 3 (three) times daily as needed for itching. 30 capsule 2  . pantoprazole (PROTONIX) 40 MG tablet TAKE 1 TABLET BY MOUTH 2 TIMES DAILY 60 tablet 0  . metoprolol (LOPRESSOR) 50 MG tablet Take 1 tablet (50 mg total) by mouth 2 (two) times daily. (Patient not taking: Reported on 04/16/2016) 60 tablet 3  . predniSONE (DELTASONE) 20 MG tablet Take 3 tablets (60 mg total) by mouth daily. (Patient not taking: Reported on 04/16/2016) 15 tablet 0  . sevelamer carbonate (RENVELA) 800 MG  tablet Take 3 tablets (2,400 mg total) by mouth 3 (three) times daily with meals. (Patient not taking: Reported on 04/16/2016) 90 tablet 5   No facility-administered medications prior to visit.     ROS Review of Systems  Constitutional: Positive for unexpected weight change. Negative for chills and fever.  HENT: Positive for sore throat.   Eyes: Negative for visual disturbance.  Respiratory: Positive for cough and shortness of breath.   Cardiovascular: Negative for chest pain, palpitations and leg swelling.  Gastrointestinal: Negative for abdominal pain and blood in stool.  Musculoskeletal: Positive for myalgias (in legs ). Negative for arthralgias and back pain.  Skin: Negative for rash.  Allergic/Immunologic: Negative for immunocompromised state.  Neurological: Positive for dizziness (upon standing ).  Hematological: Negative for adenopathy. Does not bruise/bleed easily.  Psychiatric/Behavioral: Positive for sleep disturbance. Negative for dysphoric mood and suicidal ideas.    Objective:  BP (!) 153/86 (BP Location: Left Arm, Patient Position: Sitting, Cuff Size: Small)   Pulse 80   Temp 98.1 F (36.7 C) (Oral)   Ht 5\' 7"  (1.702 m)   Wt 135 lb 12.8 oz (61.6 kg)   SpO2 99%   BMI 21.27 kg/m   Ambulatory pulse Ox 97-100%  BP/Weight 04/16/2016 03/07/2016 7/67/3419  Systolic BP 379 024 097  Diastolic BP 86 96 94  Wt. (Lbs) 135.8 135 145  BMI 21.27 21.14 22.71   Wt Readings from Last  3 Encounters:  04/16/16 135 lb 12.8 oz (61.6 kg)  03/07/16 135 lb (61.2 kg)  11/17/15 145 lb (65.8 kg)    Orthostatic VS for the past 24 hrs:  BP- Lying Pulse- Lying BP- Sitting Pulse- Sitting BP- Standing at 0 minutes Pulse- Standing at 0 minutes  04/16/16 1114 169/89 79 (!) 178/92 90 (!) 187/98 79   Physical Exam  Constitutional: She is oriented to person, place, and time. She appears well-developed and well-nourished. No distress.  Thin   HENT:  Head: Normocephalic and atraumatic.    Cardiovascular: Normal rate, regular rhythm, normal heart sounds and intact distal pulses.   Pulmonary/Chest: Effort normal and breath sounds normal.  Musculoskeletal: She exhibits no edema.  Neurological: She is alert and oriented to person, place, and time.  Skin: Skin is warm and dry. No rash noted.  Psychiatric: She has a normal mood and affect.   Depression screen [facility] 2/9 04/16/2016 12/11/2014 10/30/2014  Decreased Interest 1 0 0  Down, Depressed, Hopeless 2 0 0  PHQ - 2 Score 3 0 0  Altered sleeping 3 - -  Tired, decreased energy 3 - -  Change in appetite 2 - -  Feeling bad or failure about yourself  0 - -  Trouble concentrating 0 - -  Moving slowly or fidgety/restless 2 - -  Suicidal thoughts 0 - -  PHQ-9 Score 13 - -   GAD 7 : Generalized Anxiety Score 04/16/2016  Nervous, Anxious, on Edge 0  Control/stop worrying 0  Worry too much - different things 1  Trouble relaxing 0  Restless 1  Easily annoyed or irritable 1  Afraid - awful might happen 0  Total GAD 7 Score 3      Assessment & Plan:  Malissia was seen today for shortness of breath.  Diagnoses and all orders for this visit:  Shortness of breath -     Pulmonary function test; Future -     albuterol (PROVENTIL) (2.5 MG/3ML) 0.083% nebulizer solution; Take 3 mLs (2.5 mg total) by nebulization every 6 (six) hours as needed for wheezing or shortness of breath. -     Brain natriuretic peptide -     ECHOCARDIOGRAM COMPLETE; Future  Primary insomnia -     mirtazapine (REMERON) 15 MG tablet; Take 1 tablet (15 mg total) by mouth at bedtime.  Healthcare maintenance -     Ambulatory referral to Gastroenterology  Visit for screening mammogram -     MM DIGITAL SCREENING BILATERAL; Future  Anemia in chronic kidney disease, on chronic dialysis (HCC) -     Iron and TIBC -     Ferritin  Gastroesophageal reflux disease, esophagitis presence not specified -     Discontinue: pantoprazole (PROTONIX) 20 MG tablet; Take 2  tablets (40 mg total) by mouth 2 (two) times daily. -     pantoprazole (PROTONIX) 20 MG tablet; Take 1 tablet (20 mg total) by mouth 2 (two) times daily.  Essential hypertension -     hydrALAZINE (APRESOLINE) 10 MG tablet; Take 1 tablet (10 mg total) by mouth 3 (three) times daily.   There are no diagnoses linked to this encounter.  No orders of the defined types were placed in this encounter.   Follow-up: Return in about 4 weeks (around 05/14/2016) for HTN, SOB, insomnia .   Boykin Nearing MD

## 2016-04-17 DIAGNOSIS — D509 Iron deficiency anemia, unspecified: Secondary | ICD-10-CM | POA: Diagnosis not present

## 2016-04-17 DIAGNOSIS — N186 End stage renal disease: Secondary | ICD-10-CM | POA: Diagnosis not present

## 2016-04-17 DIAGNOSIS — D631 Anemia in chronic kidney disease: Secondary | ICD-10-CM | POA: Diagnosis not present

## 2016-04-17 DIAGNOSIS — N2581 Secondary hyperparathyroidism of renal origin: Secondary | ICD-10-CM | POA: Diagnosis not present

## 2016-04-17 LAB — IRON AND TIBC
%SAT: 14 % (ref 11–50)
Iron: 45 ug/dL (ref 45–160)
TIBC: 318 ug/dL (ref 250–450)
UIBC: 273 ug/dL (ref 125–400)

## 2016-04-17 LAB — BRAIN NATRIURETIC PEPTIDE: Brain Natriuretic Peptide: >4200 pg/mL — ABNORMAL HIGH (ref <100)

## 2016-04-19 DIAGNOSIS — D631 Anemia in chronic kidney disease: Secondary | ICD-10-CM | POA: Diagnosis not present

## 2016-04-19 DIAGNOSIS — N186 End stage renal disease: Secondary | ICD-10-CM | POA: Diagnosis not present

## 2016-04-19 DIAGNOSIS — N2581 Secondary hyperparathyroidism of renal origin: Secondary | ICD-10-CM | POA: Diagnosis not present

## 2016-04-19 DIAGNOSIS — D509 Iron deficiency anemia, unspecified: Secondary | ICD-10-CM | POA: Diagnosis not present

## 2016-04-21 ENCOUNTER — Ambulatory Visit (INDEPENDENT_AMBULATORY_CARE_PROVIDER_SITE_OTHER): Payer: Medicare Other

## 2016-04-21 ENCOUNTER — Ambulatory Visit (HOSPITAL_COMMUNITY)
Admission: EM | Admit: 2016-04-21 | Discharge: 2016-04-21 | Disposition: A | Discharge status: Home / Self Care | Payer: Medicare Other | Attending: Emergency Medicine | Admitting: Emergency Medicine

## 2016-04-21 ENCOUNTER — Telehealth: Payer: Self-pay

## 2016-04-21 ENCOUNTER — Encounter (HOSPITAL_COMMUNITY): Payer: Self-pay | Admitting: Emergency Medicine

## 2016-04-21 DIAGNOSIS — N2581 Secondary hyperparathyroidism of renal origin: Secondary | ICD-10-CM | POA: Diagnosis not present

## 2016-04-21 DIAGNOSIS — J40 Bronchitis, not specified as acute or chronic: Secondary | ICD-10-CM

## 2016-04-21 DIAGNOSIS — D509 Iron deficiency anemia, unspecified: Secondary | ICD-10-CM | POA: Diagnosis not present

## 2016-04-21 DIAGNOSIS — R05 Cough: Secondary | ICD-10-CM | POA: Diagnosis not present

## 2016-04-21 DIAGNOSIS — N186 End stage renal disease: Secondary | ICD-10-CM | POA: Diagnosis not present

## 2016-04-21 DIAGNOSIS — D631 Anemia in chronic kidney disease: Secondary | ICD-10-CM | POA: Diagnosis not present

## 2016-04-21 MED ORDER — IPRATROPIUM-ALBUTEROL 0.5-2.5 (3) MG/3ML IN SOLN
3.0000 mL | Freq: Once | RESPIRATORY_TRACT | Status: AC
  Administered 2016-04-21: 3 mL via RESPIRATORY_TRACT

## 2016-04-21 MED ORDER — PREDNISONE 50 MG PO TABS: ORAL_TABLET | ORAL | 0 refills | Status: DC

## 2016-04-21 MED ORDER — AZITHROMYCIN 250 MG PO TABS: ORAL_TABLET | ORAL | 0 refills | Status: DC

## 2016-04-21 MED ORDER — IPRATROPIUM-ALBUTEROL 0.5-2.5 (3) MG/3ML IN SOLN
RESPIRATORY_TRACT | Status: AC
  Filled 2016-04-21: qty 3

## 2016-04-21 MED ORDER — HYDROCOD POLST-CPM POLST ER 10-8 MG/5ML PO SUER: 5.0000 mL | Freq: Two times a day (BID) | ORAL | 0 refills | Status: DC | PRN

## 2016-04-21 NOTE — ED Provider Notes (Signed)
[place]    CSN: [id number] Arrival date & time: 04/21/16  1520     History   Chief Complaint Chief Complaint  Patient presents with  . Shortness of Breath    HPI Karen Morrow is a 55 y.o. female.   HPI  She is a 55 year old woman here for evaluation of cough. Her symptoms started on Monday. She describes nasal congestion, runny nose, and cough at that time. She also had a fever of 102 on Monday associated with chills. She states the nasal symptoms have resolved, but she continues to have a cough. Cough is productive of green sputum. It is painful for her to cough. She also reports feeling some shortness of breath. She had to come off dialysis early today due to not feeling well. She has tried over-the-counter cough medicines without improvement.  Past Medical History:  Diagnosis Date  . Anginal pain ([place])   . Asthma   . Chronic kidney disease    dialysis 3x wk  . Hepatitis C antibody test positive   . Hypertension    "just dx'd today" (11/03/2012)    Patient Active Problem List   Diagnosis Date Noted  . Gastroesophageal reflux disease 04/16/2016  . Anemia in chronic kidney disease, on chronic dialysis ([place]) 04/16/2016  . Primary insomnia 04/16/2016  . Shortness of breath 04/16/2016  . Pruritus 12/11/2014  . Hordeolum externum (stye) 12/11/2014  . Xanthelasma of right upper eyelid 12/11/2014  . Dermatitis 10/30/2014  . End stage renal disease on dialysis ([place]) 02/25/2014  . Malnutrition of moderate degree ([place]) 11/28/2013  . HTN (hypertension) 11/26/2013  . Gastric ulcer 06/07/2013  . Thrombocytopenia ([place]) 11/03/2012  . Elevated transaminase level 11/03/2012  . 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 Hepatitis C antibody test positive     Past Surgical History:  Procedure Laterality Date  . ABDOMINAL HYSTERECTOMY     "partial" (11/03/2012)  . AV FISTULA PLACEMENT Right 12/03/2013   Procedure:  CREATION OF ARTERIOVENOUS (AV) FISTULA RIGHT ARM ;  Surgeon: Rosetta Posner, MD;  Location: [facility];  Service: Vascular;  Laterality: Right;  . BASCILIC VEIN TRANSPOSITION Right 03/06/2014   Procedure: SECOND STAGE BASILIC VEIN TRANSPOSITION;  Surgeon: Rosetta Posner, MD;  [facility];  Service: Vascular;  Laterality: Right;  . ESOPHAGOGASTRODUODENOSCOPY N/A 06/05/2013   Procedure: ESOPHAGOGASTRODUODENOSCOPY (EGD);  Surgeon: Winfield Cunas., MD;  Location: [facility];  Service: Endoscopy;  Laterality: N/A;    OB History    No data available       Home Medications    Prior to Admission medications   Medication Sig Start Date End Date Taking? Authorizing Provider  hydrALAZINE (APRESOLINE) 10 MG tablet Take 1 tablet (10 mg total) by mouth 3 (three) times daily. 04/16/16  Yes Josalyn Funches, MD  pantoprazole (PROTONIX) 20 MG tablet Take 1 tablet (20 mg total) by mouth 2 (two) times daily. 04/16/16  Yes Josalyn Funches, MD  albuterol (PROVENTIL) (2.5 MG/3ML) 0.083% nebulizer solution Take 3 mLs (2.5 mg total) by nebulization every 6 (six) hours as needed for wheezing or shortness of breath. 04/16/16   Josalyn Funches, MD  azithromycin (ZITHROMAX Z-PAK) 250 MG tablet Take 2 pills today, then 1 pill daily until gone. 04/21/16   Melony Overly, MD  chlorpheniramine-HYDROcodone (TUSSIONEX PENNKINETIC ER) 10-8 MG/5ML SUER Take 5 mLs by mouth every 12 (twelve) hours as needed for cough. 04/21/16   Melony Overly, MD  hydrOXYzine (VISTARIL) 25 MG capsule Take 1 capsule (25 mg total) by mouth 3 (three) times daily as needed for itching. 12/11/14   Josalyn Funches, MD  mirtazapine (REMERON) 15 MG tablet Take 1 tablet (15 mg total) by mouth at bedtime. 04/16/16   Josalyn Funches, MD  predniSONE (DELTASONE) 50 MG tablet Take 1 pill daily for 5 days. 04/21/16   Melony Overly, MD    Family History Family History  Problem Relation Age of Onset  . Lupus Mother   . Diabetes Father   . Heart attack Father      Social History Social History  Substance Use Topics  . Smoking status: Light Tobacco Smoker    Packs/day: 0.50    Years: 20.00    Types: Cigarettes  . Smokeless tobacco: Never Used  . Alcohol use No     Comment: 11/03/2012 "1-2 40oz beers/day maybe"   11/17/15 "I don;t drink beer anymore since Ive been on dialysis"     Allergies   Cefazolin   Review of Systems Review of Systems As in history of present illness  Physical Exam Triage Vital Signs ED Triage Vitals  Enc Vitals Group     BP 04/21/16 1535 (!) 174/108     Pulse Rate 04/21/16 1535 91     Resp 04/21/16 1535 22     Temp 04/21/16 1535 98.6 F (37 C)     Temp Source 04/21/16 1535 Oral     SpO2 04/21/16 1535 100 %     Weight --      Height --      Head Circumference --      Peak Flow --      Pain Score 04/21/16 1545 0     Pain Loc --      Pain Edu? --      Excl. in [place]? --    No data found.   Updated Vital Signs BP (!) 174/108 (BP Location: Left Arm)   Pulse 91   Temp 98.6 F (37 C) (Oral)   Resp 22   SpO2 100%   Visual Acuity Right Eye Distance:   Left Eye Distance:   Bilateral Distance:    Right Eye Near:   Left Eye Near:    Bilateral Near:     Physical Exam  Constitutional: She is oriented to person, place, and time. She appears well-developed and well-nourished. No distress.  Neck: Neck supple.  Cardiovascular: Normal rate, regular rhythm and normal heart sounds.   No murmur heard. Pulmonary/Chest: Effort normal. No respiratory distress. She has wheezes (end expiratory in left lung base, right lung base and right mid lung.). She has no rales.  Neurological: She is alert and oriented to person, place, and time.     UC Treatments / Results  Labs (all labs ordered are listed, but only abnormal results are displayed) Labs Reviewed - No data to display  EKG  EKG Interpretation None       Radiology Dg Chest 2 View  Result Date: 04/21/2016 CLINICAL DATA:  Productive cough,  fever. EXAM: CHEST  2 VIEW COMPARISON:  Radiographs of March 07, 2016. FINDINGS: Stable cardiomegaly is noted. Atherosclerosis of thoracic aorta is noted. No pneumothorax is noted. Left lung is clear. Mild right pleural effusion is noted. Bony thorax is unremarkable. IMPRESSION: Aortic atherosclerosis.  Mild right pleural effusion. Electronically Signed   By: Marijo Conception, M.D.   On: 04/21/2016 16:37    Procedures Procedures (including critical care time)  Medications  Ordered in UC Medications  ipratropium-albuterol (DUONEB) 0.5-2.5 (3) MG/3ML nebulizer solution 3 mL (3 mLs Nebulization Given 04/21/16 1610)     Initial Impression / Assessment and Plan / UC Course  I have reviewed the triage vital signs and the nursing notes.  Pertinent labs & imaging results that were available during my care of the patient were reviewed by me and considered in my medical decision making (see chart for details).  Clinical Course     Improved wheezing after DuoNeb. Persistent rhonchi. Treat with azithromycin and prednisone. Use home albuterol as needed for wheezing. Tussionex for cough. [place] database reviewed and no active prescriptions.  Final Clinical Impressions(s) / UC Diagnoses   Final diagnoses:  Bronchitis    New Prescriptions New Prescriptions   AZITHROMYCIN (ZITHROMAX Z-PAK) 250 MG TABLET    Take 2 pills today, then 1 pill daily until gone.   CHLORPHENIRAMINE-HYDROCODONE (TUSSIONEX PENNKINETIC ER) 10-8 MG/5ML SUER    Take 5 mLs by mouth every 12 (twelve) hours as needed for cough.   PREDNISONE (DELTASONE) 50 MG TABLET    Take 1 pill daily for 5 days.     Melony Overly, MD 04/21/16 [id number]

## 2016-04-21 NOTE — Telephone Encounter (Signed)
Pt was called and a VM was left informing pt to return phone call for lab results.   If pt call back inform her of lab results.  Normal iron studies  BNP elevated but patient has CKD not BNP is not as accurate to detect congestive heart failure  She is to continue to dialysis  Control BP  ECHO ordered to evaluate heart pumping

## 2016-04-21 NOTE — ED Triage Notes (Signed)
The patient presented to the [facility] with a complaint of shortness of breath and a cough for 5 days. The patient stated that she has had a productive cough that has produced a thick green phlegm as well as increased shortness of breath. The patient is a hemodialysis patient and stated that she came off of the machine an hour earlier today because she did not feel well.

## 2016-04-21 NOTE — Discharge Instructions (Signed)
You have bronchitis. Take azithromycin and prednisone as prescribed. Use tussionex as needed for cough. Use your albuterol every 4 hours as needed for wheezing or cough. You should see improvement in the next 3-5 days. If you develop fevers, difficulty breathing, or are just not getting better, please come back or go to the emergency room.

## 2016-04-24 DIAGNOSIS — D509 Iron deficiency anemia, unspecified: Secondary | ICD-10-CM | POA: Diagnosis not present

## 2016-04-24 DIAGNOSIS — N2581 Secondary hyperparathyroidism of renal origin: Secondary | ICD-10-CM | POA: Diagnosis not present

## 2016-04-24 DIAGNOSIS — D631 Anemia in chronic kidney disease: Secondary | ICD-10-CM | POA: Diagnosis not present

## 2016-04-24 DIAGNOSIS — N186 End stage renal disease: Secondary | ICD-10-CM | POA: Diagnosis not present

## 2016-04-27 DIAGNOSIS — D509 Iron deficiency anemia, unspecified: Secondary | ICD-10-CM | POA: Diagnosis not present

## 2016-04-27 DIAGNOSIS — N2581 Secondary hyperparathyroidism of renal origin: Secondary | ICD-10-CM | POA: Diagnosis not present

## 2016-04-27 DIAGNOSIS — D631 Anemia in chronic kidney disease: Secondary | ICD-10-CM | POA: Diagnosis not present

## 2016-04-27 DIAGNOSIS — N186 End stage renal disease: Secondary | ICD-10-CM | POA: Diagnosis not present

## 2016-04-29 DIAGNOSIS — D631 Anemia in chronic kidney disease: Secondary | ICD-10-CM | POA: Diagnosis not present

## 2016-04-29 DIAGNOSIS — N2581 Secondary hyperparathyroidism of renal origin: Secondary | ICD-10-CM | POA: Diagnosis not present

## 2016-04-29 DIAGNOSIS — D509 Iron deficiency anemia, unspecified: Secondary | ICD-10-CM | POA: Diagnosis not present

## 2016-04-29 DIAGNOSIS — Z992 Dependence on renal dialysis: Secondary | ICD-10-CM | POA: Diagnosis not present

## 2016-04-29 DIAGNOSIS — N186 End stage renal disease: Secondary | ICD-10-CM | POA: Diagnosis not present

## 2016-04-29 DIAGNOSIS — I120 Hypertensive chronic kidney disease with stage 5 chronic kidney disease or end stage renal disease: Secondary | ICD-10-CM | POA: Diagnosis not present

## 2016-04-30 ENCOUNTER — Other Ambulatory Visit: Payer: Self-pay | Admitting: Family Medicine

## 2016-04-30 DIAGNOSIS — K219 Gastro-esophageal reflux disease without esophagitis: Secondary | ICD-10-CM

## 2016-04-30 MED ORDER — PANTOPRAZOLE SODIUM 20 MG PO TBEC: 20.0000 mg | DELAYED_RELEASE_TABLET | Freq: Two times a day (BID) | ORAL | 11 refills | Status: DC

## 2016-05-01 DIAGNOSIS — N186 End stage renal disease: Secondary | ICD-10-CM | POA: Diagnosis not present

## 2016-05-01 DIAGNOSIS — D631 Anemia in chronic kidney disease: Secondary | ICD-10-CM | POA: Diagnosis not present

## 2016-05-01 DIAGNOSIS — N2581 Secondary hyperparathyroidism of renal origin: Secondary | ICD-10-CM | POA: Diagnosis not present

## 2016-05-01 DIAGNOSIS — D509 Iron deficiency anemia, unspecified: Secondary | ICD-10-CM | POA: Diagnosis not present

## 2016-05-03 ENCOUNTER — Ambulatory Visit (HOSPITAL_COMMUNITY): Payer: Medicare Other

## 2016-05-04 DIAGNOSIS — N186 End stage renal disease: Secondary | ICD-10-CM | POA: Diagnosis not present

## 2016-05-04 DIAGNOSIS — D509 Iron deficiency anemia, unspecified: Secondary | ICD-10-CM | POA: Diagnosis not present

## 2016-05-04 DIAGNOSIS — N2581 Secondary hyperparathyroidism of renal origin: Secondary | ICD-10-CM | POA: Diagnosis not present

## 2016-05-04 DIAGNOSIS — D631 Anemia in chronic kidney disease: Secondary | ICD-10-CM | POA: Diagnosis not present

## 2016-05-05 ENCOUNTER — Telehealth: Payer: Self-pay | Admitting: Family Medicine

## 2016-05-05 ENCOUNTER — Ambulatory Visit (HOSPITAL_COMMUNITY): Admission: RE | Admit: 2016-05-05 | Payer: Medicare Other | Source: Ambulatory Visit

## 2016-05-05 NOTE — Telephone Encounter (Signed)
Called to inform nurse and PCP that patient didn't show up to her appointment today.

## 2016-05-06 DIAGNOSIS — D509 Iron deficiency anemia, unspecified: Secondary | ICD-10-CM | POA: Diagnosis not present

## 2016-05-06 DIAGNOSIS — D631 Anemia in chronic kidney disease: Secondary | ICD-10-CM | POA: Diagnosis not present

## 2016-05-06 DIAGNOSIS — N186 End stage renal disease: Secondary | ICD-10-CM | POA: Diagnosis not present

## 2016-05-06 DIAGNOSIS — N2581 Secondary hyperparathyroidism of renal origin: Secondary | ICD-10-CM | POA: Diagnosis not present

## 2016-05-06 NOTE — Telephone Encounter (Signed)
Noted Please call patient to ask if there is another day better for her to have the ECHO done

## 2016-05-06 NOTE — Telephone Encounter (Signed)
Will route to PCP 

## 2016-05-07 NOTE — Telephone Encounter (Signed)
Pt was called and a VM was left informing pt to return phone call to schedule new ECHO

## 2016-05-08 DIAGNOSIS — D631 Anemia in chronic kidney disease: Secondary | ICD-10-CM | POA: Diagnosis not present

## 2016-05-08 DIAGNOSIS — D509 Iron deficiency anemia, unspecified: Secondary | ICD-10-CM | POA: Diagnosis not present

## 2016-05-08 DIAGNOSIS — N2581 Secondary hyperparathyroidism of renal origin: Secondary | ICD-10-CM | POA: Diagnosis not present

## 2016-05-08 DIAGNOSIS — N186 End stage renal disease: Secondary | ICD-10-CM | POA: Diagnosis not present

## 2016-05-11 DIAGNOSIS — D631 Anemia in chronic kidney disease: Secondary | ICD-10-CM | POA: Diagnosis not present

## 2016-05-11 DIAGNOSIS — N2581 Secondary hyperparathyroidism of renal origin: Secondary | ICD-10-CM | POA: Diagnosis not present

## 2016-05-11 DIAGNOSIS — N186 End stage renal disease: Secondary | ICD-10-CM | POA: Diagnosis not present

## 2016-05-11 DIAGNOSIS — D509 Iron deficiency anemia, unspecified: Secondary | ICD-10-CM | POA: Diagnosis not present

## 2016-05-13 DIAGNOSIS — N2581 Secondary hyperparathyroidism of renal origin: Secondary | ICD-10-CM | POA: Diagnosis not present

## 2016-05-13 DIAGNOSIS — D631 Anemia in chronic kidney disease: Secondary | ICD-10-CM | POA: Diagnosis not present

## 2016-05-13 DIAGNOSIS — D509 Iron deficiency anemia, unspecified: Secondary | ICD-10-CM | POA: Diagnosis not present

## 2016-05-13 DIAGNOSIS — N186 End stage renal disease: Secondary | ICD-10-CM | POA: Diagnosis not present

## 2016-05-15 DIAGNOSIS — D509 Iron deficiency anemia, unspecified: Secondary | ICD-10-CM | POA: Diagnosis not present

## 2016-05-15 DIAGNOSIS — N2581 Secondary hyperparathyroidism of renal origin: Secondary | ICD-10-CM | POA: Diagnosis not present

## 2016-05-15 DIAGNOSIS — N186 End stage renal disease: Secondary | ICD-10-CM | POA: Diagnosis not present

## 2016-05-15 DIAGNOSIS — D631 Anemia in chronic kidney disease: Secondary | ICD-10-CM | POA: Diagnosis not present

## 2016-05-17 ENCOUNTER — Other Ambulatory Visit: Payer: Self-pay | Admitting: Nephrology

## 2016-05-17 ENCOUNTER — Ambulatory Visit
Admission: RE | Admit: 2016-05-17 | Discharge: 2016-05-17 | Disposition: A | Discharge status: Home / Self Care | Payer: Medicare Other | Source: Ambulatory Visit | Attending: Nephrology | Admitting: Nephrology

## 2016-05-17 DIAGNOSIS — A150 Tuberculosis of lung: Secondary | ICD-10-CM

## 2016-05-17 DIAGNOSIS — R0602 Shortness of breath: Secondary | ICD-10-CM | POA: Diagnosis not present

## 2016-05-17 DIAGNOSIS — R7611 Nonspecific reaction to tuberculin skin test without active tuberculosis: Secondary | ICD-10-CM | POA: Diagnosis not present

## 2016-05-18 DIAGNOSIS — D631 Anemia in chronic kidney disease: Secondary | ICD-10-CM | POA: Diagnosis not present

## 2016-05-18 DIAGNOSIS — N2581 Secondary hyperparathyroidism of renal origin: Secondary | ICD-10-CM | POA: Diagnosis not present

## 2016-05-18 DIAGNOSIS — N186 End stage renal disease: Secondary | ICD-10-CM | POA: Diagnosis not present

## 2016-05-18 DIAGNOSIS — D509 Iron deficiency anemia, unspecified: Secondary | ICD-10-CM | POA: Diagnosis not present

## 2016-05-20 DIAGNOSIS — D509 Iron deficiency anemia, unspecified: Secondary | ICD-10-CM | POA: Diagnosis not present

## 2016-05-20 DIAGNOSIS — N186 End stage renal disease: Secondary | ICD-10-CM | POA: Diagnosis not present

## 2016-05-20 DIAGNOSIS — N2581 Secondary hyperparathyroidism of renal origin: Secondary | ICD-10-CM | POA: Diagnosis not present

## 2016-05-20 DIAGNOSIS — D631 Anemia in chronic kidney disease: Secondary | ICD-10-CM | POA: Diagnosis not present

## 2016-05-22 DIAGNOSIS — D631 Anemia in chronic kidney disease: Secondary | ICD-10-CM | POA: Diagnosis not present

## 2016-05-22 DIAGNOSIS — N186 End stage renal disease: Secondary | ICD-10-CM | POA: Diagnosis not present

## 2016-05-22 DIAGNOSIS — N2581 Secondary hyperparathyroidism of renal origin: Secondary | ICD-10-CM | POA: Diagnosis not present

## 2016-05-22 DIAGNOSIS — D509 Iron deficiency anemia, unspecified: Secondary | ICD-10-CM | POA: Diagnosis not present

## 2016-05-25 DIAGNOSIS — N2581 Secondary hyperparathyroidism of renal origin: Secondary | ICD-10-CM | POA: Diagnosis not present

## 2016-05-25 DIAGNOSIS — N186 End stage renal disease: Secondary | ICD-10-CM | POA: Diagnosis not present

## 2016-05-25 DIAGNOSIS — D631 Anemia in chronic kidney disease: Secondary | ICD-10-CM | POA: Diagnosis not present

## 2016-05-25 DIAGNOSIS — D509 Iron deficiency anemia, unspecified: Secondary | ICD-10-CM | POA: Diagnosis not present

## 2016-05-27 DIAGNOSIS — D509 Iron deficiency anemia, unspecified: Secondary | ICD-10-CM | POA: Diagnosis not present

## 2016-05-27 DIAGNOSIS — D631 Anemia in chronic kidney disease: Secondary | ICD-10-CM | POA: Diagnosis not present

## 2016-05-27 DIAGNOSIS — N186 End stage renal disease: Secondary | ICD-10-CM | POA: Diagnosis not present

## 2016-05-27 DIAGNOSIS — N2581 Secondary hyperparathyroidism of renal origin: Secondary | ICD-10-CM | POA: Diagnosis not present

## 2016-05-29 DIAGNOSIS — N2581 Secondary hyperparathyroidism of renal origin: Secondary | ICD-10-CM | POA: Diagnosis not present

## 2016-05-29 DIAGNOSIS — D631 Anemia in chronic kidney disease: Secondary | ICD-10-CM | POA: Diagnosis not present

## 2016-05-29 DIAGNOSIS — N186 End stage renal disease: Secondary | ICD-10-CM | POA: Diagnosis not present

## 2016-05-29 DIAGNOSIS — D509 Iron deficiency anemia, unspecified: Secondary | ICD-10-CM | POA: Diagnosis not present

## 2016-05-30 DIAGNOSIS — I120 Hypertensive chronic kidney disease with stage 5 chronic kidney disease or end stage renal disease: Secondary | ICD-10-CM | POA: Diagnosis not present

## 2016-05-30 DIAGNOSIS — N186 End stage renal disease: Secondary | ICD-10-CM | POA: Diagnosis not present

## 2016-05-30 DIAGNOSIS — Z992 Dependence on renal dialysis: Secondary | ICD-10-CM | POA: Diagnosis not present

## 2016-06-01 DIAGNOSIS — D631 Anemia in chronic kidney disease: Secondary | ICD-10-CM | POA: Diagnosis not present

## 2016-06-01 DIAGNOSIS — D509 Iron deficiency anemia, unspecified: Secondary | ICD-10-CM | POA: Diagnosis not present

## 2016-06-01 DIAGNOSIS — K7460 Unspecified cirrhosis of liver: Secondary | ICD-10-CM | POA: Diagnosis not present

## 2016-06-01 DIAGNOSIS — N186 End stage renal disease: Secondary | ICD-10-CM | POA: Diagnosis not present

## 2016-06-01 DIAGNOSIS — N2581 Secondary hyperparathyroidism of renal origin: Secondary | ICD-10-CM | POA: Diagnosis not present

## 2016-06-03 DIAGNOSIS — N186 End stage renal disease: Secondary | ICD-10-CM | POA: Diagnosis not present

## 2016-06-03 DIAGNOSIS — D509 Iron deficiency anemia, unspecified: Secondary | ICD-10-CM | POA: Diagnosis not present

## 2016-06-03 DIAGNOSIS — K7460 Unspecified cirrhosis of liver: Secondary | ICD-10-CM | POA: Diagnosis not present

## 2016-06-03 DIAGNOSIS — N2581 Secondary hyperparathyroidism of renal origin: Secondary | ICD-10-CM | POA: Diagnosis not present

## 2016-06-03 DIAGNOSIS — D631 Anemia in chronic kidney disease: Secondary | ICD-10-CM | POA: Diagnosis not present

## 2016-06-05 DIAGNOSIS — D631 Anemia in chronic kidney disease: Secondary | ICD-10-CM | POA: Diagnosis not present

## 2016-06-05 DIAGNOSIS — D509 Iron deficiency anemia, unspecified: Secondary | ICD-10-CM | POA: Diagnosis not present

## 2016-06-05 DIAGNOSIS — K7460 Unspecified cirrhosis of liver: Secondary | ICD-10-CM | POA: Diagnosis not present

## 2016-06-05 DIAGNOSIS — N186 End stage renal disease: Secondary | ICD-10-CM | POA: Diagnosis not present

## 2016-06-05 DIAGNOSIS — N2581 Secondary hyperparathyroidism of renal origin: Secondary | ICD-10-CM | POA: Diagnosis not present

## 2016-06-08 DIAGNOSIS — K7460 Unspecified cirrhosis of liver: Secondary | ICD-10-CM | POA: Diagnosis not present

## 2016-06-08 DIAGNOSIS — D509 Iron deficiency anemia, unspecified: Secondary | ICD-10-CM | POA: Diagnosis not present

## 2016-06-08 DIAGNOSIS — N186 End stage renal disease: Secondary | ICD-10-CM | POA: Diagnosis not present

## 2016-06-08 DIAGNOSIS — D631 Anemia in chronic kidney disease: Secondary | ICD-10-CM | POA: Diagnosis not present

## 2016-06-08 DIAGNOSIS — N2581 Secondary hyperparathyroidism of renal origin: Secondary | ICD-10-CM | POA: Diagnosis not present

## 2016-06-10 DIAGNOSIS — N2581 Secondary hyperparathyroidism of renal origin: Secondary | ICD-10-CM | POA: Diagnosis not present

## 2016-06-10 DIAGNOSIS — D631 Anemia in chronic kidney disease: Secondary | ICD-10-CM | POA: Diagnosis not present

## 2016-06-10 DIAGNOSIS — D509 Iron deficiency anemia, unspecified: Secondary | ICD-10-CM | POA: Diagnosis not present

## 2016-06-10 DIAGNOSIS — K7460 Unspecified cirrhosis of liver: Secondary | ICD-10-CM | POA: Diagnosis not present

## 2016-06-10 DIAGNOSIS — N186 End stage renal disease: Secondary | ICD-10-CM | POA: Diagnosis not present

## 2016-06-12 DIAGNOSIS — K7460 Unspecified cirrhosis of liver: Secondary | ICD-10-CM | POA: Diagnosis not present

## 2016-06-12 DIAGNOSIS — N186 End stage renal disease: Secondary | ICD-10-CM | POA: Diagnosis not present

## 2016-06-12 DIAGNOSIS — D631 Anemia in chronic kidney disease: Secondary | ICD-10-CM | POA: Diagnosis not present

## 2016-06-12 DIAGNOSIS — N2581 Secondary hyperparathyroidism of renal origin: Secondary | ICD-10-CM | POA: Diagnosis not present

## 2016-06-12 DIAGNOSIS — D509 Iron deficiency anemia, unspecified: Secondary | ICD-10-CM | POA: Diagnosis not present

## 2016-06-15 DIAGNOSIS — N2581 Secondary hyperparathyroidism of renal origin: Secondary | ICD-10-CM | POA: Diagnosis not present

## 2016-06-15 DIAGNOSIS — N186 End stage renal disease: Secondary | ICD-10-CM | POA: Diagnosis not present

## 2016-06-15 DIAGNOSIS — D631 Anemia in chronic kidney disease: Secondary | ICD-10-CM | POA: Diagnosis not present

## 2016-06-15 DIAGNOSIS — K7460 Unspecified cirrhosis of liver: Secondary | ICD-10-CM | POA: Diagnosis not present

## 2016-06-15 DIAGNOSIS — D509 Iron deficiency anemia, unspecified: Secondary | ICD-10-CM | POA: Diagnosis not present

## 2016-06-17 DIAGNOSIS — D631 Anemia in chronic kidney disease: Secondary | ICD-10-CM | POA: Diagnosis not present

## 2016-06-17 DIAGNOSIS — D509 Iron deficiency anemia, unspecified: Secondary | ICD-10-CM | POA: Diagnosis not present

## 2016-06-17 DIAGNOSIS — N186 End stage renal disease: Secondary | ICD-10-CM | POA: Diagnosis not present

## 2016-06-17 DIAGNOSIS — K7460 Unspecified cirrhosis of liver: Secondary | ICD-10-CM | POA: Diagnosis not present

## 2016-06-17 DIAGNOSIS — N2581 Secondary hyperparathyroidism of renal origin: Secondary | ICD-10-CM | POA: Diagnosis not present

## 2016-06-19 DIAGNOSIS — D631 Anemia in chronic kidney disease: Secondary | ICD-10-CM | POA: Diagnosis not present

## 2016-06-19 DIAGNOSIS — K7460 Unspecified cirrhosis of liver: Secondary | ICD-10-CM | POA: Diagnosis not present

## 2016-06-19 DIAGNOSIS — N186 End stage renal disease: Secondary | ICD-10-CM | POA: Diagnosis not present

## 2016-06-19 DIAGNOSIS — D509 Iron deficiency anemia, unspecified: Secondary | ICD-10-CM | POA: Diagnosis not present

## 2016-06-19 DIAGNOSIS — N2581 Secondary hyperparathyroidism of renal origin: Secondary | ICD-10-CM | POA: Diagnosis not present

## 2016-06-22 DIAGNOSIS — N2581 Secondary hyperparathyroidism of renal origin: Secondary | ICD-10-CM | POA: Diagnosis not present

## 2016-06-22 DIAGNOSIS — N186 End stage renal disease: Secondary | ICD-10-CM | POA: Diagnosis not present

## 2016-06-22 DIAGNOSIS — K7460 Unspecified cirrhosis of liver: Secondary | ICD-10-CM | POA: Diagnosis not present

## 2016-06-22 DIAGNOSIS — D631 Anemia in chronic kidney disease: Secondary | ICD-10-CM | POA: Diagnosis not present

## 2016-06-22 DIAGNOSIS — D509 Iron deficiency anemia, unspecified: Secondary | ICD-10-CM | POA: Diagnosis not present

## 2016-06-24 DIAGNOSIS — D509 Iron deficiency anemia, unspecified: Secondary | ICD-10-CM | POA: Diagnosis not present

## 2016-06-24 DIAGNOSIS — K7460 Unspecified cirrhosis of liver: Secondary | ICD-10-CM | POA: Diagnosis not present

## 2016-06-24 DIAGNOSIS — N186 End stage renal disease: Secondary | ICD-10-CM | POA: Diagnosis not present

## 2016-06-24 DIAGNOSIS — D631 Anemia in chronic kidney disease: Secondary | ICD-10-CM | POA: Diagnosis not present

## 2016-06-24 DIAGNOSIS — N2581 Secondary hyperparathyroidism of renal origin: Secondary | ICD-10-CM | POA: Diagnosis not present

## 2016-06-26 DIAGNOSIS — K7460 Unspecified cirrhosis of liver: Secondary | ICD-10-CM | POA: Diagnosis not present

## 2016-06-26 DIAGNOSIS — D631 Anemia in chronic kidney disease: Secondary | ICD-10-CM | POA: Diagnosis not present

## 2016-06-26 DIAGNOSIS — N186 End stage renal disease: Secondary | ICD-10-CM | POA: Diagnosis not present

## 2016-06-26 DIAGNOSIS — N2581 Secondary hyperparathyroidism of renal origin: Secondary | ICD-10-CM | POA: Diagnosis not present

## 2016-06-26 DIAGNOSIS — D509 Iron deficiency anemia, unspecified: Secondary | ICD-10-CM | POA: Diagnosis not present

## 2016-06-29 DIAGNOSIS — D631 Anemia in chronic kidney disease: Secondary | ICD-10-CM | POA: Diagnosis not present

## 2016-06-29 DIAGNOSIS — K7460 Unspecified cirrhosis of liver: Secondary | ICD-10-CM | POA: Diagnosis not present

## 2016-06-29 DIAGNOSIS — N2581 Secondary hyperparathyroidism of renal origin: Secondary | ICD-10-CM | POA: Diagnosis not present

## 2016-06-29 DIAGNOSIS — D509 Iron deficiency anemia, unspecified: Secondary | ICD-10-CM | POA: Diagnosis not present

## 2016-06-29 DIAGNOSIS — N186 End stage renal disease: Secondary | ICD-10-CM | POA: Diagnosis not present

## 2016-06-30 DIAGNOSIS — Z992 Dependence on renal dialysis: Secondary | ICD-10-CM | POA: Diagnosis not present

## 2016-06-30 DIAGNOSIS — N186 End stage renal disease: Secondary | ICD-10-CM | POA: Diagnosis not present

## 2016-06-30 DIAGNOSIS — I120 Hypertensive chronic kidney disease with stage 5 chronic kidney disease or end stage renal disease: Secondary | ICD-10-CM | POA: Diagnosis not present

## 2016-07-01 DIAGNOSIS — N186 End stage renal disease: Secondary | ICD-10-CM | POA: Diagnosis not present

## 2016-07-01 DIAGNOSIS — N2581 Secondary hyperparathyroidism of renal origin: Secondary | ICD-10-CM | POA: Diagnosis not present

## 2016-07-01 DIAGNOSIS — D509 Iron deficiency anemia, unspecified: Secondary | ICD-10-CM | POA: Diagnosis not present

## 2016-07-01 DIAGNOSIS — D631 Anemia in chronic kidney disease: Secondary | ICD-10-CM | POA: Diagnosis not present

## 2016-07-03 DIAGNOSIS — N2581 Secondary hyperparathyroidism of renal origin: Secondary | ICD-10-CM | POA: Diagnosis not present

## 2016-07-03 DIAGNOSIS — D509 Iron deficiency anemia, unspecified: Secondary | ICD-10-CM | POA: Diagnosis not present

## 2016-07-03 DIAGNOSIS — D631 Anemia in chronic kidney disease: Secondary | ICD-10-CM | POA: Diagnosis not present

## 2016-07-03 DIAGNOSIS — N186 End stage renal disease: Secondary | ICD-10-CM | POA: Diagnosis not present

## 2016-07-06 DIAGNOSIS — N2581 Secondary hyperparathyroidism of renal origin: Secondary | ICD-10-CM | POA: Diagnosis not present

## 2016-07-06 DIAGNOSIS — N186 End stage renal disease: Secondary | ICD-10-CM | POA: Diagnosis not present

## 2016-07-06 DIAGNOSIS — D509 Iron deficiency anemia, unspecified: Secondary | ICD-10-CM | POA: Diagnosis not present

## 2016-07-06 DIAGNOSIS — D631 Anemia in chronic kidney disease: Secondary | ICD-10-CM | POA: Diagnosis not present

## 2016-07-08 DIAGNOSIS — D631 Anemia in chronic kidney disease: Secondary | ICD-10-CM | POA: Diagnosis not present

## 2016-07-08 DIAGNOSIS — D509 Iron deficiency anemia, unspecified: Secondary | ICD-10-CM | POA: Diagnosis not present

## 2016-07-08 DIAGNOSIS — N2581 Secondary hyperparathyroidism of renal origin: Secondary | ICD-10-CM | POA: Diagnosis not present

## 2016-07-08 DIAGNOSIS — N186 End stage renal disease: Secondary | ICD-10-CM | POA: Diagnosis not present

## 2016-07-10 DIAGNOSIS — N186 End stage renal disease: Secondary | ICD-10-CM | POA: Diagnosis not present

## 2016-07-10 DIAGNOSIS — D631 Anemia in chronic kidney disease: Secondary | ICD-10-CM | POA: Diagnosis not present

## 2016-07-10 DIAGNOSIS — N2581 Secondary hyperparathyroidism of renal origin: Secondary | ICD-10-CM | POA: Diagnosis not present

## 2016-07-10 DIAGNOSIS — D509 Iron deficiency anemia, unspecified: Secondary | ICD-10-CM | POA: Diagnosis not present

## 2016-07-13 DIAGNOSIS — D631 Anemia in chronic kidney disease: Secondary | ICD-10-CM | POA: Diagnosis not present

## 2016-07-13 DIAGNOSIS — N2581 Secondary hyperparathyroidism of renal origin: Secondary | ICD-10-CM | POA: Diagnosis not present

## 2016-07-13 DIAGNOSIS — D509 Iron deficiency anemia, unspecified: Secondary | ICD-10-CM | POA: Diagnosis not present

## 2016-07-13 DIAGNOSIS — N186 End stage renal disease: Secondary | ICD-10-CM | POA: Diagnosis not present

## 2016-07-15 DIAGNOSIS — N2581 Secondary hyperparathyroidism of renal origin: Secondary | ICD-10-CM | POA: Diagnosis not present

## 2016-07-15 DIAGNOSIS — N186 End stage renal disease: Secondary | ICD-10-CM | POA: Diagnosis not present

## 2016-07-15 DIAGNOSIS — D631 Anemia in chronic kidney disease: Secondary | ICD-10-CM | POA: Diagnosis not present

## 2016-07-15 DIAGNOSIS — D509 Iron deficiency anemia, unspecified: Secondary | ICD-10-CM | POA: Diagnosis not present

## 2016-07-17 DIAGNOSIS — D631 Anemia in chronic kidney disease: Secondary | ICD-10-CM | POA: Diagnosis not present

## 2016-07-17 DIAGNOSIS — N2581 Secondary hyperparathyroidism of renal origin: Secondary | ICD-10-CM | POA: Diagnosis not present

## 2016-07-17 DIAGNOSIS — N186 End stage renal disease: Secondary | ICD-10-CM | POA: Diagnosis not present

## 2016-07-17 DIAGNOSIS — D509 Iron deficiency anemia, unspecified: Secondary | ICD-10-CM | POA: Diagnosis not present

## 2016-07-20 DIAGNOSIS — D509 Iron deficiency anemia, unspecified: Secondary | ICD-10-CM | POA: Diagnosis not present

## 2016-07-20 DIAGNOSIS — N2581 Secondary hyperparathyroidism of renal origin: Secondary | ICD-10-CM | POA: Diagnosis not present

## 2016-07-20 DIAGNOSIS — D631 Anemia in chronic kidney disease: Secondary | ICD-10-CM | POA: Diagnosis not present

## 2016-07-20 DIAGNOSIS — N186 End stage renal disease: Secondary | ICD-10-CM | POA: Diagnosis not present

## 2016-07-22 DIAGNOSIS — N186 End stage renal disease: Secondary | ICD-10-CM | POA: Diagnosis not present

## 2016-07-22 DIAGNOSIS — D509 Iron deficiency anemia, unspecified: Secondary | ICD-10-CM | POA: Diagnosis not present

## 2016-07-22 DIAGNOSIS — N2581 Secondary hyperparathyroidism of renal origin: Secondary | ICD-10-CM | POA: Diagnosis not present

## 2016-07-22 DIAGNOSIS — D631 Anemia in chronic kidney disease: Secondary | ICD-10-CM | POA: Diagnosis not present

## 2016-07-24 DIAGNOSIS — D631 Anemia in chronic kidney disease: Secondary | ICD-10-CM | POA: Diagnosis not present

## 2016-07-24 DIAGNOSIS — N2581 Secondary hyperparathyroidism of renal origin: Secondary | ICD-10-CM | POA: Diagnosis not present

## 2016-07-24 DIAGNOSIS — N186 End stage renal disease: Secondary | ICD-10-CM | POA: Diagnosis not present

## 2016-07-24 DIAGNOSIS — D509 Iron deficiency anemia, unspecified: Secondary | ICD-10-CM | POA: Diagnosis not present

## 2016-07-27 DIAGNOSIS — N186 End stage renal disease: Secondary | ICD-10-CM | POA: Diagnosis not present

## 2016-07-27 DIAGNOSIS — N2581 Secondary hyperparathyroidism of renal origin: Secondary | ICD-10-CM | POA: Diagnosis not present

## 2016-07-27 DIAGNOSIS — D509 Iron deficiency anemia, unspecified: Secondary | ICD-10-CM | POA: Diagnosis not present

## 2016-07-27 DIAGNOSIS — D631 Anemia in chronic kidney disease: Secondary | ICD-10-CM | POA: Diagnosis not present

## 2016-07-28 DIAGNOSIS — Z992 Dependence on renal dialysis: Secondary | ICD-10-CM | POA: Diagnosis not present

## 2016-07-28 DIAGNOSIS — I120 Hypertensive chronic kidney disease with stage 5 chronic kidney disease or end stage renal disease: Secondary | ICD-10-CM | POA: Diagnosis not present

## 2016-07-28 DIAGNOSIS — N186 End stage renal disease: Secondary | ICD-10-CM | POA: Diagnosis not present

## 2016-07-29 DIAGNOSIS — D631 Anemia in chronic kidney disease: Secondary | ICD-10-CM | POA: Diagnosis not present

## 2016-07-29 DIAGNOSIS — N2581 Secondary hyperparathyroidism of renal origin: Secondary | ICD-10-CM | POA: Diagnosis not present

## 2016-07-29 DIAGNOSIS — N186 End stage renal disease: Secondary | ICD-10-CM | POA: Diagnosis not present

## 2016-07-29 DIAGNOSIS — D509 Iron deficiency anemia, unspecified: Secondary | ICD-10-CM | POA: Diagnosis not present

## 2016-07-31 DIAGNOSIS — N186 End stage renal disease: Secondary | ICD-10-CM | POA: Diagnosis not present

## 2016-07-31 DIAGNOSIS — D509 Iron deficiency anemia, unspecified: Secondary | ICD-10-CM | POA: Diagnosis not present

## 2016-07-31 DIAGNOSIS — D631 Anemia in chronic kidney disease: Secondary | ICD-10-CM | POA: Diagnosis not present

## 2016-07-31 DIAGNOSIS — N2581 Secondary hyperparathyroidism of renal origin: Secondary | ICD-10-CM | POA: Diagnosis not present

## 2016-08-03 DIAGNOSIS — D509 Iron deficiency anemia, unspecified: Secondary | ICD-10-CM | POA: Diagnosis not present

## 2016-08-03 DIAGNOSIS — N186 End stage renal disease: Secondary | ICD-10-CM | POA: Diagnosis not present

## 2016-08-03 DIAGNOSIS — D631 Anemia in chronic kidney disease: Secondary | ICD-10-CM | POA: Diagnosis not present

## 2016-08-03 DIAGNOSIS — N2581 Secondary hyperparathyroidism of renal origin: Secondary | ICD-10-CM | POA: Diagnosis not present

## 2016-08-05 DIAGNOSIS — D631 Anemia in chronic kidney disease: Secondary | ICD-10-CM | POA: Diagnosis not present

## 2016-08-05 DIAGNOSIS — N186 End stage renal disease: Secondary | ICD-10-CM | POA: Diagnosis not present

## 2016-08-05 DIAGNOSIS — D509 Iron deficiency anemia, unspecified: Secondary | ICD-10-CM | POA: Diagnosis not present

## 2016-08-05 DIAGNOSIS — N2581 Secondary hyperparathyroidism of renal origin: Secondary | ICD-10-CM | POA: Diagnosis not present

## 2016-08-07 DIAGNOSIS — N186 End stage renal disease: Secondary | ICD-10-CM | POA: Diagnosis not present

## 2016-08-07 DIAGNOSIS — D631 Anemia in chronic kidney disease: Secondary | ICD-10-CM | POA: Diagnosis not present

## 2016-08-07 DIAGNOSIS — N2581 Secondary hyperparathyroidism of renal origin: Secondary | ICD-10-CM | POA: Diagnosis not present

## 2016-08-07 DIAGNOSIS — D509 Iron deficiency anemia, unspecified: Secondary | ICD-10-CM | POA: Diagnosis not present

## 2016-08-10 DIAGNOSIS — N186 End stage renal disease: Secondary | ICD-10-CM | POA: Diagnosis not present

## 2016-08-10 DIAGNOSIS — D509 Iron deficiency anemia, unspecified: Secondary | ICD-10-CM | POA: Diagnosis not present

## 2016-08-10 DIAGNOSIS — D631 Anemia in chronic kidney disease: Secondary | ICD-10-CM | POA: Diagnosis not present

## 2016-08-10 DIAGNOSIS — N2581 Secondary hyperparathyroidism of renal origin: Secondary | ICD-10-CM | POA: Diagnosis not present

## 2016-08-12 DIAGNOSIS — D631 Anemia in chronic kidney disease: Secondary | ICD-10-CM | POA: Diagnosis not present

## 2016-08-12 DIAGNOSIS — N2581 Secondary hyperparathyroidism of renal origin: Secondary | ICD-10-CM | POA: Diagnosis not present

## 2016-08-12 DIAGNOSIS — D509 Iron deficiency anemia, unspecified: Secondary | ICD-10-CM | POA: Diagnosis not present

## 2016-08-12 DIAGNOSIS — N186 End stage renal disease: Secondary | ICD-10-CM | POA: Diagnosis not present

## 2016-08-14 DIAGNOSIS — D631 Anemia in chronic kidney disease: Secondary | ICD-10-CM | POA: Diagnosis not present

## 2016-08-14 DIAGNOSIS — D509 Iron deficiency anemia, unspecified: Secondary | ICD-10-CM | POA: Diagnosis not present

## 2016-08-14 DIAGNOSIS — N186 End stage renal disease: Secondary | ICD-10-CM | POA: Diagnosis not present

## 2016-08-14 DIAGNOSIS — N2581 Secondary hyperparathyroidism of renal origin: Secondary | ICD-10-CM | POA: Diagnosis not present

## 2016-08-17 DIAGNOSIS — D631 Anemia in chronic kidney disease: Secondary | ICD-10-CM | POA: Diagnosis not present

## 2016-08-17 DIAGNOSIS — D509 Iron deficiency anemia, unspecified: Secondary | ICD-10-CM | POA: Diagnosis not present

## 2016-08-17 DIAGNOSIS — N2581 Secondary hyperparathyroidism of renal origin: Secondary | ICD-10-CM | POA: Diagnosis not present

## 2016-08-17 DIAGNOSIS — N186 End stage renal disease: Secondary | ICD-10-CM | POA: Diagnosis not present

## 2016-08-19 DIAGNOSIS — D509 Iron deficiency anemia, unspecified: Secondary | ICD-10-CM | POA: Diagnosis not present

## 2016-08-19 DIAGNOSIS — N186 End stage renal disease: Secondary | ICD-10-CM | POA: Diagnosis not present

## 2016-08-19 DIAGNOSIS — D631 Anemia in chronic kidney disease: Secondary | ICD-10-CM | POA: Diagnosis not present

## 2016-08-19 DIAGNOSIS — N2581 Secondary hyperparathyroidism of renal origin: Secondary | ICD-10-CM | POA: Diagnosis not present

## 2016-08-21 DIAGNOSIS — D631 Anemia in chronic kidney disease: Secondary | ICD-10-CM | POA: Diagnosis not present

## 2016-08-21 DIAGNOSIS — N2581 Secondary hyperparathyroidism of renal origin: Secondary | ICD-10-CM | POA: Diagnosis not present

## 2016-08-21 DIAGNOSIS — N186 End stage renal disease: Secondary | ICD-10-CM | POA: Diagnosis not present

## 2016-08-21 DIAGNOSIS — D509 Iron deficiency anemia, unspecified: Secondary | ICD-10-CM | POA: Diagnosis not present

## 2016-08-24 DIAGNOSIS — D631 Anemia in chronic kidney disease: Secondary | ICD-10-CM | POA: Diagnosis not present

## 2016-08-24 DIAGNOSIS — D509 Iron deficiency anemia, unspecified: Secondary | ICD-10-CM | POA: Diagnosis not present

## 2016-08-24 DIAGNOSIS — N186 End stage renal disease: Secondary | ICD-10-CM | POA: Diagnosis not present

## 2016-08-24 DIAGNOSIS — N2581 Secondary hyperparathyroidism of renal origin: Secondary | ICD-10-CM | POA: Diagnosis not present

## 2016-08-25 ENCOUNTER — Ambulatory Visit (HOSPITAL_COMMUNITY)
Admission: EM | Admit: 2016-08-25 | Discharge: 2016-08-25 | Disposition: A | Discharge status: Home / Self Care | Payer: Medicare Other | Attending: Family Medicine | Admitting: Family Medicine

## 2016-08-25 ENCOUNTER — Encounter (HOSPITAL_COMMUNITY): Payer: Self-pay | Admitting: Emergency Medicine

## 2016-08-25 DIAGNOSIS — R05 Cough: Secondary | ICD-10-CM

## 2016-08-25 DIAGNOSIS — Z720 Tobacco use: Secondary | ICD-10-CM

## 2016-08-25 DIAGNOSIS — K219 Gastro-esophageal reflux disease without esophagitis: Secondary | ICD-10-CM

## 2016-08-25 DIAGNOSIS — J40 Bronchitis, not specified as acute or chronic: Secondary | ICD-10-CM | POA: Diagnosis not present

## 2016-08-25 DIAGNOSIS — R059 Cough, unspecified: Secondary | ICD-10-CM

## 2016-08-25 MED ORDER — ALBUTEROL SULFATE (2.5 MG/3ML) 0.083% IN NEBU
INHALATION_SOLUTION | RESPIRATORY_TRACT | Status: AC
  Filled 2016-08-25: qty 3

## 2016-08-25 MED ORDER — METHYLPREDNISOLONE 4 MG PO TBPK: ORAL_TABLET | ORAL | 0 refills | Status: DC

## 2016-08-25 MED ORDER — IPRATROPIUM BROMIDE 0.06 % NA SOLN: 2.0000 | Freq: Four times a day (QID) | NASAL | 0 refills | Status: DC

## 2016-08-25 MED ORDER — ALBUTEROL SULFATE (2.5 MG/3ML) 0.083% IN NEBU
2.5000 mg | INHALATION_SOLUTION | Freq: Once | RESPIRATORY_TRACT | Status: AC
  Administered 2016-08-25: 2.5 mg via RESPIRATORY_TRACT

## 2016-08-25 MED ORDER — OMEPRAZOLE 20 MG PO CPDR: 20.0000 mg | DELAYED_RELEASE_CAPSULE | Freq: Every day | ORAL | 0 refills | Status: DC

## 2016-08-25 MED FILL — METHYLPREDNISOLONE 4 MG TAB: 4 | 6 days supply | Qty: 21 | Fill #0

## 2016-08-25 MED FILL — OMEPRAZOLE 20 MG CAP: 20 | 14 days supply | Qty: 14 | Fill #0

## 2016-08-25 MED FILL — IPRATROPIUM 0.06% SPRAY: 0.06 | 20 days supply | Qty: 15 | Fill #0

## 2016-08-25 NOTE — Discharge Instructions (Signed)
Continue albuterol nebulizer

## 2016-08-25 NOTE — ED Triage Notes (Signed)
Patient reports congestion for 2 weeks.  Reports a productive cough, unsure of color of phlegm ..denies runny nose or stuffy nose.

## 2016-08-25 NOTE — ED Provider Notes (Signed)
CSN: [id number]     Arrival date & time 08/25/16  1011 History   None    Chief Complaint  Patient presents with  . URI   (Consider location/radiation/quality/duration/timing/severity/associated sxs/prior Treatment) Patient c/o URI sx's for 2 weeks.  Patient has finished round of abx's and is still having cough.  She is a smoker.  She has ESRD.  She has GERD and she has sinus drainage.  She c/o cough at hs which is worse.   The history is provided by the patient.  URI  Presenting symptoms: congestion and cough   Severity:  Mild Onset quality:  Sudden Duration:  2 days Timing:  Constant Progression:  Improving Chronicity:  New Relieved by:  Nothing Worsened by:  Nothing Ineffective treatments:  None tried   Past Medical History:  Diagnosis Date  . Anginal pain ([place])   . Asthma   . Chronic kidney disease    dialysis 3x wk  . Hepatitis C antibody test positive   . Hypertension    "just dx'd today" (11/03/2012)   Past Surgical History:  Procedure Laterality Date  . ABDOMINAL HYSTERECTOMY     "partial" (11/03/2012)  . AV FISTULA PLACEMENT Right 12/03/2013   Procedure: CREATION OF ARTERIOVENOUS (AV) FISTULA RIGHT ARM ;  Surgeon: Rosetta Posner, MD;  Location: [facility];  Service: Vascular;  Laterality: Right;  . BASCILIC VEIN TRANSPOSITION Right 03/06/2014   Procedure: SECOND STAGE BASILIC VEIN TRANSPOSITION;  Surgeon: Rosetta Posner, MD;  Location: [facility];  Service: Vascular;  Laterality: Right;  . ESOPHAGOGASTRODUODENOSCOPY N/A 06/05/2013   Procedure: ESOPHAGOGASTRODUODENOSCOPY (EGD);  Surgeon: Winfield Cunas., MD;  Location: [facility];  Service: Endoscopy;  Laterality: N/A;   Family History  Problem Relation Age of Onset  . Lupus Mother   . Diabetes Father   . Heart attack Father    Social History  Substance Use Topics  . Smoking status: Light Tobacco Smoker    Packs/day: 0.50    Years: 20.00    Types: Cigarettes  . Smokeless tobacco: Never Used  . Alcohol use No      Comment: 11/03/2012 "1-2 40oz beers/day maybe"   11/17/15 "I don;t drink beer anymore since Ive been on dialysis"   OB History    No data available     Review of Systems  Constitutional: Negative.   HENT: Positive for congestion.   Eyes: Negative.   Respiratory: Positive for cough.   Cardiovascular: Negative.   Gastrointestinal: Negative.   Endocrine: Negative.   Genitourinary: Negative.   Musculoskeletal: Negative.   Allergic/Immunologic: Negative.   Neurological: Negative.   Hematological: Negative.   Psychiatric/Behavioral: Negative.     Allergies  Cefazolin  Home Medications   Prior to Admission medications   Medication Sig Start Date End Date Taking? Authorizing Provider  albuterol (PROVENTIL) (2.5 MG/3ML) 0.083% nebulizer solution Take 3 mLs (2.5 mg total) by nebulization every 6 (six) hours as needed for wheezing or shortness of breath. 04/16/16   Josalyn Funches, MD  azithromycin (ZITHROMAX Z-PAK) 250 MG tablet Take 2 pills today, then 1 pill daily until gone. 04/21/16   Melony Overly, MD  chlorpheniramine-HYDROcodone (TUSSIONEX PENNKINETIC ER) 10-8 MG/5ML SUER Take 5 mLs by mouth every 12 (twelve) hours as needed for cough. 04/21/16   Melony Overly, MD  hydrALAZINE (APRESOLINE) 10 MG tablet Take 1 tablet (10 mg total) by mouth 3 (three) times daily. 04/16/16   Josalyn Funches, MD  hydrOXYzine (VISTARIL) 25 MG capsule Take 1 capsule (  25 mg total) by mouth 3 (three) times daily as needed for itching. 12/11/14   Josalyn Funches, MD  ipratropium (ATROVENT) 0.06 % nasal spray Place 2 sprays into both nostrils 4 (four) times daily. 08/25/16   Lysbeth Penner, FNP  methylPREDNISolone (MEDROL DOSEPAK) 4 MG TBPK tablet Take 6-5-4-3-2-1 po qd 08/25/16   Lysbeth Penner, FNP  mirtazapine (REMERON) 15 MG tablet Take 1 tablet (15 mg total) by mouth at bedtime. 04/16/16   Josalyn Funches, MD  omeprazole (PRILOSEC) 20 MG capsule Take 1 capsule (20 mg total) by mouth daily. 08/25/16   Lysbeth Penner, FNP  pantoprazole (PROTONIX) 20 MG tablet Take 1 tablet (20 mg total) by mouth 2 (two) times daily. 04/30/16   Josalyn Funches, MD  predniSONE (DELTASONE) 50 MG tablet Take 1 pill daily for 5 days. 04/21/16   Melony Overly, MD   Meds Ordered and Administered this Visit   Medications  albuterol (PROVENTIL) (2.5 MG/3ML) 0.083% nebulizer solution 2.5 mg (2.5 mg Nebulization Given 08/25/16 1200)    BP (!) 170/111 (BP Location: Left Arm)   Pulse 79   Temp 98.6 F (37 C) (Oral)   Resp 20   SpO2 100%  No data found.   Physical Exam  Constitutional: She is oriented to person, place, and time. She appears well-developed and well-nourished.  HENT:  Head: Normocephalic and atraumatic.  Right Ear: External ear normal.  Left Ear: External ear normal.  Mouth/Throat: Oropharynx is clear and moist.  Eyes: Conjunctivae and EOM are normal. Pupils are equal, round, and reactive to light.  Neck: Normal range of motion. Neck supple.  Cardiovascular: Normal rate, regular rhythm and normal heart sounds.   Pulmonary/Chest: Effort normal and breath sounds normal.  Abdominal: Soft. Bowel sounds are normal.  Musculoskeletal: Normal range of motion.  Neurological: She is alert and oriented to person, place, and time.  Nursing note and vitals reviewed.   Urgent Care Course     Procedures (including critical care time)  Labs Review Labs Reviewed - No data to display  Imaging Review No results found.   Visual Acuity Review  Right Eye Distance:   Left Eye Distance:   Bilateral Distance:    Right Eye Near:   Left Eye Near:    Bilateral Near:         MDM   1. Bronchitis   2. Cough   3. Tobacco abuse   4. Gastroesophageal reflux disease without esophagitis    Medrol dose pack as directed $mg #21 Tessalon Perles Atrovent nasal spray  Continue albuterol neb prn  Stop smoking.      Lysbeth Penner, FNP 08/25/16 1220

## 2016-08-26 DIAGNOSIS — N186 End stage renal disease: Secondary | ICD-10-CM | POA: Diagnosis not present

## 2016-08-26 DIAGNOSIS — D509 Iron deficiency anemia, unspecified: Secondary | ICD-10-CM | POA: Diagnosis not present

## 2016-08-26 DIAGNOSIS — D631 Anemia in chronic kidney disease: Secondary | ICD-10-CM | POA: Diagnosis not present

## 2016-08-26 DIAGNOSIS — N2581 Secondary hyperparathyroidism of renal origin: Secondary | ICD-10-CM | POA: Diagnosis not present

## 2016-08-28 DIAGNOSIS — D631 Anemia in chronic kidney disease: Secondary | ICD-10-CM | POA: Diagnosis not present

## 2016-08-28 DIAGNOSIS — N186 End stage renal disease: Secondary | ICD-10-CM | POA: Diagnosis not present

## 2016-08-28 DIAGNOSIS — Z992 Dependence on renal dialysis: Secondary | ICD-10-CM | POA: Diagnosis not present

## 2016-08-28 DIAGNOSIS — D509 Iron deficiency anemia, unspecified: Secondary | ICD-10-CM | POA: Diagnosis not present

## 2016-08-28 DIAGNOSIS — I120 Hypertensive chronic kidney disease with stage 5 chronic kidney disease or end stage renal disease: Secondary | ICD-10-CM | POA: Diagnosis not present

## 2016-08-28 DIAGNOSIS — N2581 Secondary hyperparathyroidism of renal origin: Secondary | ICD-10-CM | POA: Diagnosis not present

## 2016-08-31 DIAGNOSIS — D631 Anemia in chronic kidney disease: Secondary | ICD-10-CM | POA: Diagnosis not present

## 2016-08-31 DIAGNOSIS — K7460 Unspecified cirrhosis of liver: Secondary | ICD-10-CM | POA: Diagnosis not present

## 2016-08-31 DIAGNOSIS — N186 End stage renal disease: Secondary | ICD-10-CM | POA: Diagnosis not present

## 2016-08-31 DIAGNOSIS — D509 Iron deficiency anemia, unspecified: Secondary | ICD-10-CM | POA: Diagnosis not present

## 2016-08-31 DIAGNOSIS — N2581 Secondary hyperparathyroidism of renal origin: Secondary | ICD-10-CM | POA: Diagnosis not present

## 2016-09-02 DIAGNOSIS — K7460 Unspecified cirrhosis of liver: Secondary | ICD-10-CM | POA: Diagnosis not present

## 2016-09-02 DIAGNOSIS — N186 End stage renal disease: Secondary | ICD-10-CM | POA: Diagnosis not present

## 2016-09-02 DIAGNOSIS — D509 Iron deficiency anemia, unspecified: Secondary | ICD-10-CM | POA: Diagnosis not present

## 2016-09-02 DIAGNOSIS — D631 Anemia in chronic kidney disease: Secondary | ICD-10-CM | POA: Diagnosis not present

## 2016-09-02 DIAGNOSIS — N2581 Secondary hyperparathyroidism of renal origin: Secondary | ICD-10-CM | POA: Diagnosis not present

## 2016-09-04 DIAGNOSIS — D631 Anemia in chronic kidney disease: Secondary | ICD-10-CM | POA: Diagnosis not present

## 2016-09-04 DIAGNOSIS — N2581 Secondary hyperparathyroidism of renal origin: Secondary | ICD-10-CM | POA: Diagnosis not present

## 2016-09-04 DIAGNOSIS — D509 Iron deficiency anemia, unspecified: Secondary | ICD-10-CM | POA: Diagnosis not present

## 2016-09-04 DIAGNOSIS — N186 End stage renal disease: Secondary | ICD-10-CM | POA: Diagnosis not present

## 2016-09-04 DIAGNOSIS — K7460 Unspecified cirrhosis of liver: Secondary | ICD-10-CM | POA: Diagnosis not present

## 2016-09-06 ENCOUNTER — Emergency Department (HOSPITAL_COMMUNITY): Payer: Medicare Other

## 2016-09-06 ENCOUNTER — Inpatient Hospital Stay (HOSPITAL_COMMUNITY)
Admission: EM | Admit: 2016-09-06 | Discharge: 2016-09-09 | DRG: 377 | Disposition: A | Discharge status: Home / Self Care | Payer: Medicare Other | Attending: Internal Medicine | Admitting: Internal Medicine

## 2016-09-06 ENCOUNTER — Encounter (HOSPITAL_COMMUNITY): Payer: Self-pay | Admitting: Emergency Medicine

## 2016-09-06 DIAGNOSIS — J45909 Unspecified asthma, uncomplicated: Secondary | ICD-10-CM | POA: Diagnosis present

## 2016-09-06 DIAGNOSIS — D62 Acute posthemorrhagic anemia: Secondary | ICD-10-CM | POA: Diagnosis not present

## 2016-09-06 DIAGNOSIS — E162 Hypoglycemia, unspecified: Secondary | ICD-10-CM | POA: Diagnosis present

## 2016-09-06 DIAGNOSIS — N2581 Secondary hyperparathyroidism of renal origin: Secondary | ICD-10-CM | POA: Diagnosis not present

## 2016-09-06 DIAGNOSIS — F17210 Nicotine dependence, cigarettes, uncomplicated: Secondary | ICD-10-CM | POA: Diagnosis present

## 2016-09-06 DIAGNOSIS — Z992 Dependence on renal dialysis: Secondary | ICD-10-CM

## 2016-09-06 DIAGNOSIS — I1311 Hypertensive heart and chronic kidney disease without heart failure, with stage 5 chronic kidney disease, or end stage renal disease: Secondary | ICD-10-CM | POA: Diagnosis present

## 2016-09-06 DIAGNOSIS — Z832 Family history of diseases of the blood and blood-forming organs and certain disorders involving the immune mechanism: Secondary | ICD-10-CM

## 2016-09-06 DIAGNOSIS — K922 Gastrointestinal hemorrhage, unspecified: Secondary | ICD-10-CM | POA: Diagnosis present

## 2016-09-06 DIAGNOSIS — I120 Hypertensive chronic kidney disease with stage 5 chronic kidney disease or end stage renal disease: Secondary | ICD-10-CM | POA: Diagnosis present

## 2016-09-06 DIAGNOSIS — K219 Gastro-esophageal reflux disease without esophagitis: Secondary | ICD-10-CM | POA: Diagnosis present

## 2016-09-06 DIAGNOSIS — K7460 Unspecified cirrhosis of liver: Secondary | ICD-10-CM | POA: Diagnosis present

## 2016-09-06 DIAGNOSIS — Z8249 Family history of ischemic heart disease and other diseases of the circulatory system: Secondary | ICD-10-CM

## 2016-09-06 DIAGNOSIS — B1920 Unspecified viral hepatitis C without hepatic coma: Secondary | ICD-10-CM | POA: Diagnosis present

## 2016-09-06 DIAGNOSIS — Z8711 Personal history of peptic ulcer disease: Secondary | ICD-10-CM

## 2016-09-06 DIAGNOSIS — K920 Hematemesis: Secondary | ICD-10-CM

## 2016-09-06 DIAGNOSIS — R1013 Epigastric pain: Secondary | ICD-10-CM | POA: Diagnosis not present

## 2016-09-06 DIAGNOSIS — K625 Hemorrhage of anus and rectum: Secondary | ICD-10-CM | POA: Diagnosis not present

## 2016-09-06 DIAGNOSIS — D649 Anemia, unspecified: Secondary | ICD-10-CM | POA: Diagnosis not present

## 2016-09-06 DIAGNOSIS — N186 End stage renal disease: Secondary | ICD-10-CM

## 2016-09-06 DIAGNOSIS — Z90710 Acquired absence of both cervix and uterus: Secondary | ICD-10-CM

## 2016-09-06 DIAGNOSIS — I10 Essential (primary) hypertension: Secondary | ICD-10-CM | POA: Diagnosis present

## 2016-09-06 DIAGNOSIS — D631 Anemia in chronic kidney disease: Secondary | ICD-10-CM | POA: Diagnosis present

## 2016-09-06 DIAGNOSIS — K259 Gastric ulcer, unspecified as acute or chronic, without hemorrhage or perforation: Secondary | ICD-10-CM | POA: Diagnosis present

## 2016-09-06 DIAGNOSIS — Z881 Allergy status to other antibiotic agents status: Secondary | ICD-10-CM

## 2016-09-06 DIAGNOSIS — Z79899 Other long term (current) drug therapy: Secondary | ICD-10-CM

## 2016-09-06 DIAGNOSIS — K921 Melena: Secondary | ICD-10-CM | POA: Diagnosis present

## 2016-09-06 DIAGNOSIS — Z833 Family history of diabetes mellitus: Secondary | ICD-10-CM

## 2016-09-06 DIAGNOSIS — Z9114 Patient's other noncompliance with medication regimen: Secondary | ICD-10-CM

## 2016-09-06 DIAGNOSIS — D638 Anemia in other chronic diseases classified elsewhere: Secondary | ICD-10-CM | POA: Diagnosis present

## 2016-09-06 DIAGNOSIS — E8889 Other specified metabolic disorders: Secondary | ICD-10-CM | POA: Diagnosis present

## 2016-09-06 HISTORY — DX: Dyspnea, unspecified: R06.00

## 2016-09-06 HISTORY — DX: Gastro-esophageal reflux disease without esophagitis: K21.9

## 2016-09-06 LAB — POC OCCULT BLOOD, ED: Fecal Occult Bld: POSITIVE — AB

## 2016-09-06 LAB — COMPREHENSIVE METABOLIC PANEL
ALT: 26 U/L (ref 14–54)
AST: 35 U/L (ref 15–41)
Albumin: 3.5 g/dL (ref 3.5–5.0)
Alkaline Phosphatase: 68 U/L (ref 38–126)
Anion gap: 15 (ref 5–15)
BUN: 65 mg/dL — ABNORMAL HIGH (ref 6–20)
CO2: 27 mmol/L (ref 22–32)
Calcium: 9.3 mg/dL (ref 8.9–10.3)
Chloride: 97 mmol/L — ABNORMAL LOW (ref 101–111)
Creatinine, Ser: 8.18 mg/dL — ABNORMAL HIGH (ref 0.44–1.00)
GFR calc Af Amer: 6 mL/min — ABNORMAL LOW (ref >60)
GFR calc non Af Amer: 5 mL/min — ABNORMAL LOW (ref >60)
Glucose, Bld: 82 mg/dL (ref 65–99)
Potassium: 4.9 mmol/L (ref 3.5–5.1)
Sodium: 139 mmol/L (ref 135–145)
Total Bilirubin: 0.6 mg/dL (ref 0.3–1.2)
Total Protein: 6.6 g/dL (ref 6.5–8.1)

## 2016-09-06 LAB — I-STAT CG4 LACTIC ACID, ED: Lactic Acid, Venous: 1.09 mmol/L (ref 0.5–1.9)

## 2016-09-06 LAB — CBC
HCT: 27.7 % — ABNORMAL LOW (ref 36.0–46.0)
Hemoglobin: 9.3 g/dL — ABNORMAL LOW (ref 12.0–15.0)
MCH: 31.4 pg (ref 26.0–34.0)
MCHC: 33.6 g/dL (ref 30.0–36.0)
MCV: 93.6 fL (ref 78.0–100.0)
Platelets: 198 10*3/uL (ref 150–400)
RBC: 2.96 MIL/uL — ABNORMAL LOW (ref 3.87–5.11)
RDW: 16.2 % — ABNORMAL HIGH (ref 11.5–15.5)
WBC: 7.1 10*3/uL (ref 4.0–10.5)

## 2016-09-06 MED ORDER — SODIUM CHLORIDE 0.9 % IV SOLN
80.0000 mg | Freq: Once | INTRAVENOUS | Status: AC
  Administered 2016-09-07: 80 mg via INTRAVENOUS
  Filled 2016-09-06: qty 80

## 2016-09-06 MED ORDER — SODIUM CHLORIDE 0.9 % IV SOLN
Freq: Once | INTRAVENOUS | Status: AC
  Administered 2016-09-06: 23:00:00 via INTRAVENOUS

## 2016-09-06 NOTE — ED Notes (Signed)
Patient had episode of diarrhea in lobby restroom.  Patient soiled pants and underwear.  Washcloths provided to patient as well as scrubs and underwear.  BM noted to be dark black and thin.

## 2016-09-06 NOTE — ED Provider Notes (Signed)
[facility] DEPT Provider Note   CSN: [id number] Arrival date & time: 09/06/16  1553     History   Chief Complaint Chief Complaint  Patient presents with  . GI Bleeding    HPI Karen Morrow is a 56 y.o. female with a hx of ESRD on dialysis (T, Th, Sat), hypertension, gastric ulcer (per chart review), liver cirrhosis, hepatitis C presents to the Emergency Department complaining of intermittent episodes of nausea and vomiting black stomach contents.  Pt reports severe, 10/10, stabbing epigastric abd pain onset 2 days ago in conjunction without the vomiting.  Pt reports last bout of emesis was this morning.  She reports that while in the waiting room she had uncontrolled back diarrhea, soiling her clothes.  Pt reports a remote hx of alcohol usage. No known aggravating or alleviating factors.  Pt denies fever, chills, headache, neck pain, palpitations, syncope.  Pt reports she makes minimal urine and denies urinary symptoms.       The history is provided by the patient and medical records. No language interpreter was used.    Past Medical History:  Diagnosis Date  . Anginal pain ([place])   . Asthma   . Chronic kidney disease    dialysis 3x wk  . Hepatitis C antibody test positive   . Hypertension    "just dx'd today" (11/03/2012)    Patient Active Problem List   Diagnosis Date Noted  . Acute GI bleeding 09/07/2016  . Gastroesophageal reflux disease 04/16/2016  . Anemia in chronic kidney disease, on chronic dialysis ([place]) 04/16/2016  . Primary insomnia 04/16/2016  . Shortness of breath 04/16/2016  . Pruritus 12/11/2014  . Hordeolum externum (stye) 12/11/2014  . Xanthelasma of right upper eyelid 12/11/2014  . Dermatitis 10/30/2014  . End stage renal disease on dialysis ([place]) 02/25/2014  . Malnutrition of moderate degree ([place]) 11/28/2013  . HTN (hypertension) 11/26/2013  . Gastric ulcer 06/07/2013  . Thrombocytopenia ([place]) 11/03/2012  . Elevated transaminase level 11/03/2012   . 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 Hepatitis C antibody test positive     Past Surgical History:  Procedure Laterality Date  . ABDOMINAL HYSTERECTOMY     "partial" (11/03/2012)  . AV FISTULA PLACEMENT Right 12/03/2013   Procedure: CREATION OF ARTERIOVENOUS (AV) FISTULA RIGHT ARM ;  Surgeon: Rosetta Posner, MD;  Location: [facility];  Service: Vascular;  Laterality: Right;  . BASCILIC VEIN TRANSPOSITION Right 03/06/2014   Procedure: SECOND STAGE BASILIC VEIN TRANSPOSITION;  Surgeon: Rosetta Posner, MD;  Location: [facility] OR;  Service: Vascular;  Laterality: Right;  . ESOPHAGOGASTRODUODENOSCOPY N/A 06/05/2013   Procedure: ESOPHAGOGASTRODUODENOSCOPY (EGD);  Surgeon: Winfield Cunas., MD;  Location: [facility];  Service: Endoscopy;  Laterality: N/A;    OB History    No data available       Home Medications    Prior to Admission medications   Medication Sig Start Date End Date Taking? Authorizing Provider  albuterol (PROVENTIL) (2.5 MG/3ML) 0.083% nebulizer solution Take 3 mLs (2.5 mg total) by nebulization every 6 (six) hours as needed for wheezing or shortness of breath. 04/16/16  Yes Josalyn Funches, MD  amLODipine (NORVASC) 10 MG tablet Take 10 mg by mouth at bedtime. 08/27/16  Yes Historical Provider, MD  hydrALAZINE (APRESOLINE) 10 MG tablet Take 1 tablet (10 mg total) by mouth 3 (three) times daily. 04/16/16  Yes Josalyn Funches, MD  metoprolol tartrate (LOPRESSOR) 25 MG tablet Take 25 mg by  mouth 2 (two) times daily. 08/27/16  Yes Historical Provider, MD  omeprazole (PRILOSEC) 20 MG capsule Take 1 capsule (20 mg total) by mouth daily. 08/25/16  Yes Lysbeth Penner, FNP  pantoprazole (PROTONIX) 20 MG tablet Take 1 tablet (20 mg total) by mouth 2 (two) times daily. 04/30/16  Yes Josalyn Funches, MD  azithromycin (ZITHROMAX Z-PAK) 250 MG tablet Take 2 pills today, then 1 pill daily until gone. Patient not taking: Reported on 09/06/2016 04/21/16    Melony Overly, MD  chlorpheniramine-HYDROcodone [facility]) 10-8 MG/5ML SUER Take 5 mLs by mouth every 12 (twelve) hours as needed for cough. Patient not taking: Reported on 09/06/2016 04/21/16   Melony Overly, MD  hydrOXYzine (VISTARIL) 25 MG capsule Take 1 capsule (25 mg total) by mouth 3 (three) times daily as needed for itching. Patient not taking: Reported on 09/06/2016 12/11/14   Boykin Nearing, MD  ipratropium (ATROVENT) 0.06 % nasal spray Place 2 sprays into both nostrils 4 (four) times daily. 08/25/16   Lysbeth Penner, FNP  methylPREDNISolone (MEDROL DOSEPAK) 4 MG TBPK tablet Take 6-5-4-3-2-1 po qd Patient not taking: Reported on 09/06/2016 08/25/16   Lysbeth Penner, FNP  mirtazapine (REMERON) 15 MG tablet Take 1 tablet (15 mg total) by mouth at bedtime. Patient not taking: Reported on 09/06/2016 04/16/16   Boykin Nearing, MD  predniSONE (DELTASONE) 50 MG tablet Take 1 pill daily for 5 days. Patient not taking: Reported on 09/06/2016 04/21/16   Melony Overly, MD    Family History Family History  Problem Relation Age of Onset  . Lupus Mother   . Diabetes Father   . Heart attack Father     Social History Social History  Substance Use Topics  . Smoking status: Light Tobacco Smoker    Packs/day: 0.50    Years: 20.00    Types: Cigarettes  . Smokeless tobacco: Never Used  . Alcohol use No     Comment: 11/03/2012 "1-2 40oz beers/day maybe"   11/17/15 "I don;t drink beer anymore since Ive been on dialysis"     Allergies   Cefazolin   Review of Systems Review of Systems  Constitutional: Positive for fatigue. Negative for chills and fever.  Respiratory: Negative for chest tightness and shortness of breath.   Cardiovascular: Negative for chest pain and leg swelling.  Gastrointestinal: Positive for abdominal pain, blood in stool, diarrhea, nausea and vomiting.  Genitourinary: Negative for dysuria.  All other systems reviewed and are negative.    Physical Exam Updated  Vital Signs BP (!) 172/106   Pulse 89   Temp 97.7 F (36.5 C) (Oral)   Resp 16   Ht 5\' 7"  (1.702 m)   Wt 59.9 kg   SpO2 94%   BMI 20.67 kg/m   Physical Exam  Constitutional: She appears well-developed and well-nourished. No distress.  Awake, alert, nontoxic appearance  HENT:  Head: Normocephalic and atraumatic.  Mouth/Throat: Oropharynx is clear and moist. No oropharyngeal exudate.  Eyes: Conjunctivae are normal. No scleral icterus.  Neck: Normal range of motion. Neck supple.  Cardiovascular: Normal rate, regular rhythm and intact distal pulses.   Pulmonary/Chest: Effort normal and breath sounds normal. No respiratory distress. She has no wheezes.  Equal chest expansion  Abdominal: Soft. Bowel sounds are normal. She exhibits no mass. There is tenderness in the epigastric area. There is guarding. There is no rigidity and no rebound.  Genitourinary: Rectal exam shows no external hemorrhoid, no internal hemorrhoid, no fissure, no mass,  no tenderness and anal tone normal. Pelvic exam was performed with patient in the knee-chest position.  Genitourinary Comments: Chaperone present Lovena Le, RN Ulcerated rash to the gluteal cleft without purulent drainage, induration or increased warmth Black stool on DRE  Musculoskeletal: Normal range of motion. She exhibits no edema.  Fistula in the left upper arm with palpable thrill  Neurological: She is alert.  Speech is clear and goal oriented Moves extremities without ataxia  Skin: Skin is warm and dry. She is not diaphoretic.  Psychiatric: She has a normal mood and affect.  Nursing note and vitals reviewed.    ED Treatments / Results  Labs (all labs ordered are listed, but only abnormal results are displayed) Labs Reviewed  COMPREHENSIVE METABOLIC PANEL - Abnormal; Notable for the following:       Result Value   Chloride 97 (*)    BUN 65 (*)    Creatinine, Ser 8.18 (*)    GFR calc non Af Amer 5 (*)    GFR calc Af Amer 6 (*)    All  other components within normal limits  CBC - Abnormal; Notable for the following:    RBC 2.96 (*)    Hemoglobin 9.3 (*)    HCT 27.7 (*)    RDW 16.2 (*)    All other components within normal limits  POC OCCULT BLOOD, ED - Abnormal; Notable for the following:    Fecal Occult Bld POSITIVE (*)    All other components within normal limits  PROTIME-INR  I-STAT CG4 LACTIC ACID, ED  TYPE AND SCREEN    Radiology Dg Abdomen Acute W/chest  Result Date: 09/06/2016 CLINICAL DATA:  Epigastric pain and rectal bleeding EXAM: DG ABDOMEN ACUTE W/ 1V CHEST COMPARISON:  CXR 05/17/2016 FINDINGS: Stable cardiomegaly with aortic atherosclerosis. Minimal blunting of the right costophrenic angle consistent with mild pleural thickening or trace pleural effusion. No overt pulmonary edema, pneumonic consolidation or pneumothorax. Vascular clips are seen within the medial right arm. Unremarkable bowel gas pattern. Aortoiliac and branch vessel atherosclerosis. No organomegaly is noted. Phlebolith is seen in the pelvis. Degenerative changes are noted along the dorsal spine. IMPRESSION: Stable cardiomegaly with chronic minimal blunting the right costophrenic angle possibly representing a chronic lower effusion or pleural thickening. Unremarkable bowel gas pattern. CT with enteric contrast may help for further evaluation of the patient's rectal bleeding as deemed clinically necessary. Electronically Signed   By: Ashley Royalty M.D.   On: 09/06/2016 23:50    Procedures Procedures (including critical care time)  Medications Ordered in ED Medications  pantoprazole (PROTONIX) 80 mg in sodium chloride 0.9 % 100 mL IVPB (not administered)  0.9 %  sodium chloride infusion ( Intravenous New Bag/Given 09/06/16 2304)     Initial Impression / Assessment and Plan / ED Course  I have reviewed the triage vital signs and the nursing notes.  Pertinent labs & imaging results that were available during my care of the patient were reviewed  by me and considered in my medical decision making (see chart for details).     Pt with multiple episodes of black tarry emesis and uncontrolled black diarrhea.  Hgb baseline.  Pt with a hx of PUD per chart review and remote hx of alcohol abuse with cirrhosis.  Pt with HTN, but no tachycardia or hypotension.  Pt is willing to have blood transfusion if needed.  She is scheduled for dialysis tomorrow.  Occult positive. Lactic acid normal.  Plain films without evidence of free air, doubt  perforation.    Discussed with Dr. Hal Hope who will admit.  The patient was discussed with and seen by Dr. Vanita Panda who agrees with the treatment plan.   Final Clinical Impressions(s) / ED Diagnoses   Final diagnoses:  Gastrointestinal hemorrhage with hematemesis  ESRD (end stage renal disease) on dialysis ([place])  Anemia of chronic disease    New Prescriptions New Prescriptions   No medications on file     Abigail Butts, PA-C 09/07/16 0008    Carmin Muskrat, MD 09/07/16 2352

## 2016-09-06 NOTE — ED Triage Notes (Signed)
Pt states she has been vomiting dark red blood and having dark red loose stools. Pt states she is a dialysis pt. Pt feels weak all over. Abd cramping.

## 2016-09-07 ENCOUNTER — Encounter (HOSPITAL_COMMUNITY): Payer: Self-pay | Admitting: Internal Medicine

## 2016-09-07 DIAGNOSIS — I1311 Hypertensive heart and chronic kidney disease without heart failure, with stage 5 chronic kidney disease, or end stage renal disease: Secondary | ICD-10-CM | POA: Diagnosis present

## 2016-09-07 DIAGNOSIS — D638 Anemia in other chronic diseases classified elsewhere: Secondary | ICD-10-CM | POA: Diagnosis not present

## 2016-09-07 DIAGNOSIS — J45909 Unspecified asthma, uncomplicated: Secondary | ICD-10-CM | POA: Diagnosis present

## 2016-09-07 DIAGNOSIS — E8889 Other specified metabolic disorders: Secondary | ICD-10-CM | POA: Diagnosis present

## 2016-09-07 DIAGNOSIS — K920 Hematemesis: Secondary | ICD-10-CM | POA: Diagnosis not present

## 2016-09-07 DIAGNOSIS — Z90710 Acquired absence of both cervix and uterus: Secondary | ICD-10-CM | POA: Diagnosis not present

## 2016-09-07 DIAGNOSIS — Z79899 Other long term (current) drug therapy: Secondary | ICD-10-CM | POA: Diagnosis not present

## 2016-09-07 DIAGNOSIS — D631 Anemia in chronic kidney disease: Secondary | ICD-10-CM | POA: Diagnosis present

## 2016-09-07 DIAGNOSIS — K7460 Unspecified cirrhosis of liver: Secondary | ICD-10-CM | POA: Diagnosis present

## 2016-09-07 DIAGNOSIS — F17210 Nicotine dependence, cigarettes, uncomplicated: Secondary | ICD-10-CM | POA: Diagnosis present

## 2016-09-07 DIAGNOSIS — Z8711 Personal history of peptic ulcer disease: Secondary | ICD-10-CM | POA: Diagnosis not present

## 2016-09-07 DIAGNOSIS — N2581 Secondary hyperparathyroidism of renal origin: Secondary | ICD-10-CM | POA: Diagnosis present

## 2016-09-07 DIAGNOSIS — Z992 Dependence on renal dialysis: Secondary | ICD-10-CM

## 2016-09-07 DIAGNOSIS — K922 Gastrointestinal hemorrhage, unspecified: Secondary | ICD-10-CM | POA: Diagnosis present

## 2016-09-07 DIAGNOSIS — K259 Gastric ulcer, unspecified as acute or chronic, without hemorrhage or perforation: Secondary | ICD-10-CM | POA: Diagnosis not present

## 2016-09-07 DIAGNOSIS — Z833 Family history of diabetes mellitus: Secondary | ICD-10-CM | POA: Diagnosis not present

## 2016-09-07 DIAGNOSIS — K3189 Other diseases of stomach and duodenum: Secondary | ICD-10-CM | POA: Diagnosis not present

## 2016-09-07 DIAGNOSIS — D62 Acute posthemorrhagic anemia: Secondary | ICD-10-CM | POA: Diagnosis present

## 2016-09-07 DIAGNOSIS — E162 Hypoglycemia, unspecified: Secondary | ICD-10-CM | POA: Diagnosis present

## 2016-09-07 DIAGNOSIS — N186 End stage renal disease: Secondary | ICD-10-CM | POA: Diagnosis not present

## 2016-09-07 DIAGNOSIS — I120 Hypertensive chronic kidney disease with stage 5 chronic kidney disease or end stage renal disease: Secondary | ICD-10-CM | POA: Diagnosis present

## 2016-09-07 DIAGNOSIS — Z9114 Patient's other noncompliance with medication regimen: Secondary | ICD-10-CM | POA: Diagnosis not present

## 2016-09-07 DIAGNOSIS — K219 Gastro-esophageal reflux disease without esophagitis: Secondary | ICD-10-CM | POA: Diagnosis present

## 2016-09-07 DIAGNOSIS — Z8249 Family history of ischemic heart disease and other diseases of the circulatory system: Secondary | ICD-10-CM | POA: Diagnosis not present

## 2016-09-07 DIAGNOSIS — Z832 Family history of diseases of the blood and blood-forming organs and certain disorders involving the immune mechanism: Secondary | ICD-10-CM | POA: Diagnosis not present

## 2016-09-07 DIAGNOSIS — B1920 Unspecified viral hepatitis C without hepatic coma: Secondary | ICD-10-CM | POA: Diagnosis present

## 2016-09-07 LAB — PROTIME-INR
INR: 1.06
Prothrombin Time: 13.9 seconds (ref 11.4–15.2)

## 2016-09-07 LAB — CBC
HCT: 23.8 % — ABNORMAL LOW (ref 36.0–46.0)
HCT: 22.5 % — ABNORMAL LOW (ref 36.0–46.0)
HCT: 29.3 % — ABNORMAL LOW (ref 36.0–46.0)
Hemoglobin: 8 g/dL — ABNORMAL LOW (ref 12.0–15.0)
Hemoglobin: 7.6 g/dL — ABNORMAL LOW (ref 12.0–15.0)
Hemoglobin: 10 g/dL — ABNORMAL LOW (ref 12.0–15.0)
MCH: 31.3 pg (ref 26.0–34.0)
MCH: 31.5 pg (ref 26.0–34.0)
MCH: 31.3 pg (ref 26.0–34.0)
MCHC: 33.6 g/dL (ref 30.0–36.0)
MCHC: 33.8 g/dL (ref 30.0–36.0)
MCHC: 34.1 g/dL (ref 30.0–36.0)
MCV: 93 fL (ref 78.0–100.0)
MCV: 93.4 fL (ref 78.0–100.0)
MCV: 91.6 fL (ref 78.0–100.0)
Platelets: 179 10*3/uL (ref 150–400)
Platelets: 164 10*3/uL (ref 150–400)
Platelets: 132 10*3/uL — ABNORMAL LOW (ref 150–400)
RBC: 2.56 MIL/uL — ABNORMAL LOW (ref 3.87–5.11)
RBC: 2.41 MIL/uL — ABNORMAL LOW (ref 3.87–5.11)
RBC: 3.2 MIL/uL — ABNORMAL LOW (ref 3.87–5.11)
RDW: 16.2 % — ABNORMAL HIGH (ref 11.5–15.5)
RDW: 16.3 % — ABNORMAL HIGH (ref 11.5–15.5)
RDW: 15.4 % (ref 11.5–15.5)
WBC: 7.8 10*3/uL (ref 4.0–10.5)
WBC: 7.3 10*3/uL (ref 4.0–10.5)
WBC: 5.7 10*3/uL (ref 4.0–10.5)

## 2016-09-07 LAB — BASIC METABOLIC PANEL
Anion gap: 16 — ABNORMAL HIGH (ref 5–15)
BUN: 73 mg/dL — ABNORMAL HIGH (ref 6–20)
CO2: 25 mmol/L (ref 22–32)
Calcium: 9.1 mg/dL (ref 8.9–10.3)
Chloride: 97 mmol/L — ABNORMAL LOW (ref 101–111)
Creatinine, Ser: 8.88 mg/dL — ABNORMAL HIGH (ref 0.44–1.00)
GFR calc Af Amer: 5 mL/min — ABNORMAL LOW (ref >60)
GFR calc non Af Amer: 4 mL/min — ABNORMAL LOW (ref >60)
Glucose, Bld: 82 mg/dL (ref 65–99)
Potassium: 4.4 mmol/L (ref 3.5–5.1)
Sodium: 138 mmol/L (ref 135–145)

## 2016-09-07 LAB — GLUCOSE, CAPILLARY
Glucose-Capillary: 117 mg/dL — ABNORMAL HIGH (ref 65–99)
Glucose-Capillary: 153 mg/dL — ABNORMAL HIGH (ref 65–99)

## 2016-09-07 LAB — CBG MONITORING, ED: Glucose-Capillary: 66 mg/dL (ref 65–99)

## 2016-09-07 LAB — HIV ANTIBODY (ROUTINE TESTING W REFLEX): HIV Screen 4th Generation wRfx: NONREACTIVE

## 2016-09-07 LAB — PREPARE RBC (CROSSMATCH)

## 2016-09-07 MED ORDER — HYDRALAZINE HCL 20 MG/ML IJ SOLN
10.0000 mg | INTRAMUSCULAR | Status: DC | PRN
  Administered 2016-09-09: 10 mg via INTRAVENOUS
  Filled 2016-09-07: qty 1

## 2016-09-07 MED ORDER — DEXTROSE 10 % IV SOLN
INTRAVENOUS | Status: DC
  Administered 2016-09-07: 10:00:00 via INTRAVENOUS

## 2016-09-07 MED ORDER — DOXERCALCIFEROL 4 MCG/2ML IV SOLN
4.0000 ug | INTRAVENOUS | Status: DC
  Administered 2016-09-09: 4 ug via INTRAVENOUS
  Filled 2016-09-07: qty 2

## 2016-09-07 MED ORDER — LIDOCAINE HCL (PF) 1 % IJ SOLN: 5.0000 mL | INTRAMUSCULAR | Status: DC | PRN

## 2016-09-07 MED ORDER — CIPROFLOXACIN IN D5W 400 MG/200ML IV SOLN
400.0000 mg | Freq: Once | INTRAVENOUS | Status: AC
  Administered 2016-09-07: 400 mg via INTRAVENOUS
  Filled 2016-09-07: qty 200

## 2016-09-07 MED ORDER — ALTEPLASE 2 MG IJ SOLR: 2.0000 mg | Freq: Once | INTRAMUSCULAR | Status: DC | PRN

## 2016-09-07 MED ORDER — SODIUM CHLORIDE 0.9 % IV SOLN
125.0000 mg | INTRAVENOUS | Status: DC
  Administered 2016-09-09: 125 mg via INTRAVENOUS
  Filled 2016-09-07 (×2): qty 10

## 2016-09-07 MED ORDER — SODIUM CHLORIDE 0.9 % IV SOLN: Freq: Once | INTRAVENOUS | Status: DC

## 2016-09-07 MED ORDER — SODIUM CHLORIDE 0.9 % IV SOLN: 100.0000 mL | INTRAVENOUS | Status: DC | PRN

## 2016-09-07 MED ORDER — LIDOCAINE-PRILOCAINE 2.5-2.5 % EX CREA: 1.0000 "application " | TOPICAL_CREAM | CUTANEOUS | Status: DC | PRN

## 2016-09-07 MED ORDER — FENTANYL CITRATE (PF) 100 MCG/2ML IJ SOLN
50.0000 ug | Freq: Once | INTRAMUSCULAR | Status: AC
  Administered 2016-09-07: 50 ug via INTRAVENOUS
  Filled 2016-09-07: qty 2

## 2016-09-07 MED ORDER — CIPROFLOXACIN IN D5W 400 MG/200ML IV SOLN
400.0000 mg | INTRAVENOUS | Status: DC
  Filled 2016-09-07: qty 200

## 2016-09-07 MED ORDER — PENTAFLUOROPROP-TETRAFLUOROETH EX AERO: 1.0000 "application " | INHALATION_SPRAY | CUTANEOUS | Status: DC | PRN

## 2016-09-07 MED ORDER — LORAZEPAM 2 MG/ML IJ SOLN: 0.5000 mg | Freq: Once | INTRAMUSCULAR | Status: DC

## 2016-09-07 MED ORDER — PANTOPRAZOLE SODIUM 40 MG IV SOLR: 40.0000 mg | Freq: Two times a day (BID) | INTRAVENOUS | Status: DC

## 2016-09-07 MED ORDER — SODIUM CHLORIDE 0.9 % IV SOLN
50.0000 ug/h | INTRAVENOUS | Status: DC
  Administered 2016-09-07 (×2): 50 ug/h via INTRAVENOUS
  Filled 2016-09-07 (×5): qty 1

## 2016-09-07 MED ORDER — SODIUM CHLORIDE 0.9 % IV SOLN
8.0000 mg/h | INTRAVENOUS | Status: DC
  Administered 2016-09-07 – 2016-09-08 (×2): 8 mg/h via INTRAVENOUS
  Filled 2016-09-07 (×8): qty 80

## 2016-09-07 MED ORDER — HEPARIN SODIUM (PORCINE) 1000 UNIT/ML DIALYSIS: 1000.0000 [IU] | INTRAMUSCULAR | Status: DC | PRN

## 2016-09-07 MED ORDER — ACETAMINOPHEN 325 MG PO TABS: 650.0000 mg | ORAL_TABLET | Freq: Four times a day (QID) | ORAL | Status: DC | PRN

## 2016-09-07 MED ORDER — MORPHINE SULFATE (PF) 2 MG/ML IV SOLN: 1.0000 mg | INTRAVENOUS | Status: DC | PRN

## 2016-09-07 MED ORDER — OCTREOTIDE LOAD VIA INFUSION
50.0000 ug | Freq: Once | INTRAVENOUS | Status: AC
  Administered 2016-09-07: 50 ug via INTRAVENOUS
  Filled 2016-09-07: qty 25

## 2016-09-07 MED ORDER — ACETAMINOPHEN 650 MG RE SUPP
650.0000 mg | Freq: Four times a day (QID) | RECTAL | Status: DC | PRN
Start: 2016-09-07 — End: 2016-09-09

## 2016-09-07 NOTE — Consult Note (Signed)
Referring Provider: [facility] Primary Care Physician:  Minerva Ends, MD Primary Gastroenterologist:  unassigned  Reason for Consultation: GI bleed  ASSESSMENT AND PLAN:    13. 56 yo female admitted with GI bleed (hematemesis, melena). Rule out recurrent PUD. She has a history of a large deep gastric ulcer in 2015.,  biopsies negative for h.pylori . No malignancy. She didn't get a follow up EGD as recommended by [facility]. No recent NSAID use.  -for further evaluation patient will be scheduled for EGD. The risks and benefits of EGD were discussed and the patient agrees to proceed.  -continue PPI, she is on a gtt -monitor CBC. Hgb should stabilize since bleeding has stopped -clear liquids are okay. NPO after MN  2. HCV, viral load > 1.5 million in 2015. No recent abdominal imaging but liver appeared normal on CTscan in 2014.  No varices or portal gastropathy on EGD in January 2015, coags normal. Platelets low end of normal range.  3. ESRD, on HD.    HPI: Karen Morrow is a 56 y.o. female with HCV and CKD on HD. She presented to ED last night with hematemesis. She developed acute upper abdominal pain a couple of days ago. On Sunday she began vomiting black emesis and reports black stools as well. No oral iron of bismuth use. Prior to onset of these symptoms patient states she had been feeling fine. Denies NSAID use. She had a large gastric ulcer in 2015 but never followed up wit [facility] for repeat EGD. Denies hx of liver disease or hx of heavy ETOH use. Baseline hgb mid 9, down to 7.6 overnight. No further hematemesis. She reports passage of small very dark stool this am. She takes a PPI at home.   Past Medical History:  Diagnosis Date  . Anginal pain ([place])   . Asthma   . Chronic kidney disease    dialysis 3x wk  . Hepatitis C antibody test positive   . Hypertension    "just dx'd today" (11/03/2012)    Past Surgical History:  Procedure Laterality Date  . ABDOMINAL  HYSTERECTOMY     "partial" (11/03/2012)  . AV FISTULA PLACEMENT Right 12/03/2013   Procedure: CREATION OF ARTERIOVENOUS (AV) FISTULA RIGHT ARM ;  Surgeon: Rosetta Posner, MD;  Location: [facility];  Service: Vascular;  Laterality: Right;  . BASCILIC VEIN TRANSPOSITION Right 03/06/2014   Procedure: SECOND STAGE BASILIC VEIN TRANSPOSITION;  Surgeon: Rosetta Posner, MD;  Location: [facility] OR;  Service: Vascular;  Laterality: Right;  . ESOPHAGOGASTRODUODENOSCOPY N/A 06/05/2013   Procedure: ESOPHAGOGASTRODUODENOSCOPY (EGD);  Surgeon: Winfield Cunas., MD;  Location: [facility];  Service: Endoscopy;  Laterality: N/A;    Prior to Admission medications   Medication Sig Start Date End Date Taking? Authorizing Provider  albuterol (PROVENTIL) (2.5 MG/3ML) 0.083% nebulizer solution Take 3 mLs (2.5 mg total) by nebulization every 6 (six) hours as needed for wheezing or shortness of breath. 04/16/16  Yes Josalyn Funches, MD  amLODipine (NORVASC) 10 MG tablet Take 10 mg by mouth at bedtime. 08/27/16  Yes Historical Provider, MD  hydrALAZINE (APRESOLINE) 10 MG tablet Take 1 tablet (10 mg total) by mouth 3 (three) times daily. 04/16/16  Yes Josalyn Funches, MD  metoprolol tartrate (LOPRESSOR) 25 MG tablet Take 25 mg by mouth 2 (two) times daily. 08/27/16  Yes Historical Provider, MD  omeprazole (PRILOSEC) 20 MG capsule Take 1 capsule (20 mg total) by mouth daily. 08/25/16  Yes Lysbeth Penner, FNP  pantoprazole (PROTONIX) 20 MG tablet Take 1 tablet (20 mg total) by mouth 2 (two) times daily. 04/30/16  Yes Josalyn Funches, MD  azithromycin (ZITHROMAX Z-PAK) 250 MG tablet Take 2 pills today, then 1 pill daily until gone. Patient not taking: Reported on 09/06/2016 04/21/16   Melony Overly, MD  chlorpheniramine-HYDROcodone [facility] ER) 10-8 MG/5ML SUER Take 5 mLs by mouth every 12 (twelve) hours as needed for cough. Patient not taking: Reported on 09/06/2016 04/21/16   Melony Overly, MD  hydrOXYzine (VISTARIL) 25 MG capsule  Take 1 capsule (25 mg total) by mouth 3 (three) times daily as needed for itching. Patient not taking: Reported on 09/06/2016 12/11/14   Boykin Nearing, MD  ipratropium (ATROVENT) 0.06 % nasal spray Place 2 sprays into both nostrils 4 (four) times daily. 08/25/16   Lysbeth Penner, FNP  methylPREDNISolone (MEDROL DOSEPAK) 4 MG TBPK tablet Take 6-5-4-3-2-1 po qd Patient not taking: Reported on 09/06/2016 08/25/16   Lysbeth Penner, FNP  mirtazapine (REMERON) 15 MG tablet Take 1 tablet (15 mg total) by mouth at bedtime. Patient not taking: Reported on 09/06/2016 04/16/16   Boykin Nearing, MD  predniSONE (DELTASONE) 50 MG tablet Take 1 pill daily for 5 days. Patient not taking: Reported on 09/06/2016 04/21/16   Melony Overly, MD    Current Facility-Administered Medications  Medication Dose Route Frequency Provider Last Rate Last Dose  . 0.9 %  sodium chloride infusion   Intravenous Once Reyne Dumas, MD      . acetaminophen (TYLENOL) tablet 650 mg  650 mg Oral Q6H PRN Rise Patience, MD       Or  . acetaminophen (TYLENOL) suppository 650 mg  650 mg Rectal Q6H PRN Rise Patience, MD      . Derrill Memo ON 09/08/2016] ciprofloxacin (CIPRO) IVPB 400 mg  400 mg Intravenous Q24H Laren Everts, RPH      . dextrose 10 % infusion   Intravenous Continuous Reyne Dumas, MD 25 mL/hr at 09/07/16 0958    . doxercalciferol (HECTOROL) injection 4 mcg  4 mcg Intravenous Q T,Th,Sa-HD Alric Seton, PA-C      . [START ON 09/09/2016] ferric gluconate (NULECIT) 125 mg in sodium chloride 0.9 % 100 mL IVPB  125 mg Intravenous Q Thu-HD Alric Seton, PA-C      . hydrALAZINE (APRESOLINE) injection 10 mg  10 mg Intravenous Q4H PRN Rise Patience, MD      . octreotide (SANDOSTATIN) 500 mcg in sodium chloride 0.9 % 250 mL (2 mcg/mL) infusion  50 mcg/hr Intravenous Continuous Rise Patience, MD 25 mL/hr at 09/07/16 0812 50 mcg/hr at 09/07/16 4854  . pantoprazole (PROTONIX) 80 mg in sodium chloride 0.9 % 250 mL (0.32  mg/mL) infusion  8 mg/hr Intravenous Continuous Rise Patience, MD 25 mL/hr at 09/07/16 0216 8 mg/hr at 09/07/16 0216  . [START ON 09/10/2016] pantoprazole (PROTONIX) injection 40 mg  40 mg Intravenous Q12H Rise Patience, MD       Current Outpatient Prescriptions  Medication Sig Dispense Refill  . albuterol (PROVENTIL) (2.5 MG/3ML) 0.083% nebulizer solution Take 3 mLs (2.5 mg total) by nebulization every 6 (six) hours as needed for wheezing or shortness of breath. 150 mL 3  . amLODipine (NORVASC) 10 MG tablet Take 10 mg by mouth at bedtime.  6  . hydrALAZINE (APRESOLINE) 10 MG tablet Take 1 tablet (10 mg total) by mouth 3 (three) times daily. 90 tablet 3  . metoprolol tartrate (LOPRESSOR) 25  MG tablet Take 25 mg by mouth 2 (two) times daily.  6  . omeprazole (PRILOSEC) 20 MG capsule Take 1 capsule (20 mg total) by mouth daily. 14 capsule 0  . pantoprazole (PROTONIX) 20 MG tablet Take 1 tablet (20 mg total) by mouth 2 (two) times daily. 60 tablet 11  . azithromycin (ZITHROMAX Z-PAK) 250 MG tablet Take 2 pills today, then 1 pill daily until gone. (Patient not taking: Reported on 09/06/2016) 6 tablet 0  . chlorpheniramine-HYDROcodone (TUSSIONEX PENNKINETIC ER) 10-8 MG/5ML SUER Take 5 mLs by mouth every 12 (twelve) hours as needed for cough. (Patient not taking: Reported on 09/06/2016) 140 mL 0  . hydrOXYzine (VISTARIL) 25 MG capsule Take 1 capsule (25 mg total) by mouth 3 (three) times daily as needed for itching. (Patient not taking: Reported on 09/06/2016) 30 capsule 2  . ipratropium (ATROVENT) 0.06 % nasal spray Place 2 sprays into both nostrils 4 (four) times daily. 15 mL 0  . methylPREDNISolone (MEDROL DOSEPAK) 4 MG TBPK tablet Take 6-5-4-3-2-1 po qd (Patient not taking: Reported on 09/06/2016) 21 tablet 0  . mirtazapine (REMERON) 15 MG tablet Take 1 tablet (15 mg total) by mouth at bedtime. (Patient not taking: Reported on 09/06/2016) 30 tablet 2  . predniSONE (DELTASONE) 50 MG tablet Take 1 pill  daily for 5 days. (Patient not taking: Reported on 09/06/2016) 5 tablet 0    Allergies as of 09/06/2016 - Review Complete 09/06/2016  Allergen Reaction Noted  . Cefazolin  12/05/2013    Family History  Problem Relation Age of Onset  . Lupus Mother   . Diabetes Father   . Heart attack Father     Social History   Social History  . Marital status: Married    Spouse name: N/A  . Number of children: N/A  . Years of education: N/A   Occupational History  . Not on file.   Social History Main Topics  . Smoking status: Light Tobacco Smoker    Packs/day: 0.50    Years: 20.00    Types: Cigarettes  . Smokeless tobacco: Never Used  . Alcohol use 0.6 oz/week    1 Glasses of wine per week     Comment: 09/07/16-pt reports less than 1 glass of wine/week. 11/03/2012 "1-2 40oz beers/day maybe"   11/17/15 "I don;t drink beer anymore since Ive been on dialysis". p  . Drug use: Yes    Types: Marijuana     Comment: 11/03/2012 "maybe once or twice/wk" 11/17/15 "I haven't smoked in 3-4 months"  . Sexual activity: Not Currently   Other Topics Concern  . Not on file   Social History Narrative  . No narrative on file    Review of Systems: All systems reviewed and negative except where noted in HPI.  Physical Exam: Vital signs in last 24 hours: Temp:  [97.7 F (36.5 C)-98 F (36.7 C)] 97.7 F (36.5 C) (04/09 2202) Pulse Rate:  [80-94] 82 (04/10 1400) Resp:  [12-24] 21 (04/10 1400) BP: (133-172)/(86-106) 134/91 (04/10 1400) SpO2:  [93 %-100 %] 96 % (04/10 1319) FiO2 (%):  [0 %] 0 % (04/10 0200) Weight:  [125 lb (56.7 kg)-132 lb (59.9 kg)] 125 lb (56.7 kg) (04/10 1319)   General:   Alert, thin, black female in NAD on dialysis Eyes:  Sclera clear, no icterus.   Conjunctiva pink. Ears:  Normal auditory acuity. Nose:  No deformity, discharge,  or lesions. Neck:  Supple; no masses  Lungs:  Clear throughout to auscultation.  No wheezes, crackles, or rhonchi.  Heart:  Regular rate and  rhythm; no murmurs, clicks, rubs,  or gallops. Abdomen:  Soft,nontender, BS active,nonpalp mass or hsm.   Rectal:  Scant amount of dry dark brown residue  Msk:  Symmetrical without gross deformities. . Extremities:  Without clubbing or edema. Neurologic:  Alert and  oriented x4;  grossly normal neurologically. Skin:  Intact without significant lesions or rashes.. Psych:  Alert and cooperative. Normal mood and affect.  Intake/Output from previous day: 04/09 0701 - 04/10 0700 In: 100 [IV Piggyback:100] Out: 1 [Stool:1] Intake/Output this shift: Total I/O In: 200 [IV Piggyback:200] Out: -   Lab Results:  Recent Labs  09/06/16 1623 09/07/16 0408 09/07/16 0946  WBC 7.1 7.8 7.3  HGB 9.3* 8.0* 7.6*  HCT 27.7* 23.8* 22.5*  PLT 198 179 164   BMET  Recent Labs  09/06/16 1623 09/07/16 0408  NA 139 138  K 4.9 4.4  CL 97* 97*  CO2 27 25  GLUCOSE 82 82  BUN 65* 73*  CREATININE 8.18* 8.88*  CALCIUM 9.3 9.1   LFT  Recent Labs  09/06/16 1623  PROT 6.6  ALBUMIN 3.5  AST 35  ALT 26  ALKPHOS 68  BILITOT 0.6   PT/INR  Recent Labs  09/07/16 0002  LABPROT 13.9  INR 1.06    Studies/Results: Dg Abdomen Acute W/chest  Result Date: 09/06/2016 CLINICAL DATA:  Epigastric pain and rectal bleeding EXAM: DG ABDOMEN ACUTE W/ 1V CHEST COMPARISON:  CXR 05/17/2016 FINDINGS: Stable cardiomegaly with aortic atherosclerosis. Minimal blunting of the right costophrenic angle consistent with mild pleural thickening or trace pleural effusion. No overt pulmonary edema, pneumonic consolidation or pneumothorax. Vascular clips are seen within the medial right arm. Unremarkable bowel gas pattern. Aortoiliac and branch vessel atherosclerosis. No organomegaly is noted. Phlebolith is seen in the pelvis. Degenerative changes are noted along the dorsal spine. IMPRESSION: Stable cardiomegaly with chronic minimal blunting the right costophrenic angle possibly representing a chronic lower effusion or  pleural thickening. Unremarkable bowel gas pattern. CT with enteric contrast may help for further evaluation of the patient's rectal bleeding as deemed clinically necessary. Electronically Signed   By: Ashley Royalty M.D.   On: 09/06/2016 23:50    Tye Savoy, NP-C @  09/07/2016, 2:23 PM  Pager number [phone]  ________________________________________________________________________  [name] [facility] MD note:  I personally examined the patient, reviewed the data and agree with the assessment and plan described above.  Planning on EGD tomorrow morning for UGI bleeding.  Doubt ongoing, active bleeding currently.   Owens Loffler, MD [facility] Gastroenterology Pager [phone]

## 2016-09-07 NOTE — ED Notes (Signed)
CBG 66; RN aware

## 2016-09-07 NOTE — Procedures (Signed)
Pt seen on HD.  Ap 110 Vp 190.  BFR 400.  Tolerating HD well so far.

## 2016-09-07 NOTE — Consult Note (Signed)
[facility] Renal Consultation Note    Indication for Consultation:  Management of ESRD/hemodialysis; anemia, hypertension/volume and secondary hyperparathyroidism PCP: Minerva Ends, MD   HPI: Karen Morrow is a 56 y.o. female with ESRD on TTS dialylsis at [facility] since June 2015 with hx of Hep C +, HTN, cirrhosis, prior GIB 2015 secondary to gastric ulcer on no heparin HD who presented yesterday with coffee ground emesis for several days and associated epigastric and lower pain.  Pt denies NSAIDs or aspirin. She aslo had one large dark loose incontinent BM in the ED with was FOBT +.  She has not had any since. She had lower abdominal pain which is improved since pain med.  Hgb was 11.8 4/5 down to 9.3 upon arrival yesterday and has been trending down to 8 at 4 am and now 7.6.  Platelets are stable. She denies NSAIDs, asa and alcohol intake. She denies SOB, CP, fever, chills.  she has minimal urine output. She is requesting something to eat. She lives with her husband who works with Water engineer at [employer].  She is due for dialysis today.   Past Medical History:  Diagnosis Date  . Anginal pain ([place])   . Asthma   . Chronic kidney disease    dialysis 3x wk  . Hepatitis C antibody test positive   . Hypertension    "just dx'd today" (11/03/2012)   Past Surgical History:  Procedure Laterality Date  . ABDOMINAL HYSTERECTOMY     "partial" (11/03/2012)  . AV FISTULA PLACEMENT Right 12/03/2013   Procedure: CREATION OF ARTERIOVENOUS (AV) FISTULA RIGHT ARM ;  Surgeon: Rosetta Posner, MD;  Location: [place];  Service: Vascular;  Laterality: Right;  . BASCILIC VEIN TRANSPOSITION Right 03/06/2014   Procedure: SECOND STAGE BASILIC VEIN TRANSPOSITION;  Surgeon: Rosetta Posner, MD;  Location: [facility] OR;  Service: Vascular;  Laterality: Right;  . ESOPHAGOGASTRODUODENOSCOPY N/A 06/05/2013   Procedure: ESOPHAGOGASTRODUODENOSCOPY (EGD);  Surgeon: Winfield Cunas., MD;  Location: [facility];   Service: Endoscopy;  Laterality: N/A;   Family History  Problem Relation Age of Onset  . Lupus Mother   . Diabetes Father   . Heart attack Father    Social History:  reports that she has been smoking Cigarettes.  She has a 10.00 pack-year smoking history. She has never used smokeless tobacco. She reports that she drinks about 0.6 oz of alcohol per week . She reports that she uses drugs, including Marijuana. Allergies  Allergen Reactions  . Cefazolin     Severe thrombocytopenia   Prior to Admission medications   Medication Sig Start Date End Date Taking? Authorizing Provider  albuterol (PROVENTIL) (2.5 MG/3ML) 0.083% nebulizer solution Take 3 mLs (2.5 mg total) by nebulization every 6 (six) hours as needed for wheezing or shortness of breath. 04/16/16  Yes Josalyn Funches, MD  amLODipine (NORVASC) 10 MG tablet Take 10 mg by mouth at bedtime. 08/27/16  Yes Historical Provider, MD  hydrALAZINE (APRESOLINE) 10 MG tablet Take 1 tablet (10 mg total) by mouth 3 (three) times daily. 04/16/16  Yes Josalyn Funches, MD  metoprolol tartrate (LOPRESSOR) 25 MG tablet Take 25 mg by mouth 2 (two) times daily. 08/27/16  Yes Historical Provider, MD  omeprazole (PRILOSEC) 20 MG capsule Take 1 capsule (20 mg total) by mouth daily. 08/25/16  Yes Lysbeth Penner, FNP  pantoprazole (PROTONIX) 20 MG tablet Take 1 tablet (20 mg total) by mouth 2 (two) times daily. 04/30/16  Yes Josalyn Funches,  MD  azithromycin (ZITHROMAX Z-PAK) 250 MG tablet Take 2 pills today, then 1 pill daily until gone. Patient not taking: Reported on 09/06/2016 04/21/16   Melony Overly, MD  chlorpheniramine-HYDROcodone [facility] ER) 10-8 MG/5ML SUER Take 5 mLs by mouth every 12 (twelve) hours as needed for cough. Patient not taking: Reported on 09/06/2016 04/21/16   Melony Overly, MD  hydrOXYzine (VISTARIL) 25 MG capsule Take 1 capsule (25 mg total) by mouth 3 (three) times daily as needed for itching. Patient not taking: Reported on  09/06/2016 12/11/14   Boykin Nearing, MD  ipratropium (ATROVENT) 0.06 % nasal spray Place 2 sprays into both nostrils 4 (four) times daily. 08/25/16   Lysbeth Penner, FNP  methylPREDNISolone (MEDROL DOSEPAK) 4 MG TBPK tablet Take 6-5-4-3-2-1 po qd Patient not taking: Reported on 09/06/2016 08/25/16   Lysbeth Penner, FNP  mirtazapine (REMERON) 15 MG tablet Take 1 tablet (15 mg total) by mouth at bedtime. Patient not taking: Reported on 09/06/2016 04/16/16   Boykin Nearing, MD  predniSONE (DELTASONE) 50 MG tablet Take 1 pill daily for 5 days. Patient not taking: Reported on 09/06/2016 04/21/16   Melony Overly, MD   Current Facility-Administered Medications  Medication Dose Route Frequency Provider Last Rate Last Dose  . 0.9 %  sodium chloride infusion   Intravenous Once Reyne Dumas, MD      . acetaminophen (TYLENOL) tablet 650 mg  650 mg Oral Q6H PRN Rise Patience, MD       Or  . acetaminophen (TYLENOL) suppository 650 mg  650 mg Rectal Q6H PRN Rise Patience, MD      . Derrill Memo ON 09/08/2016] ciprofloxacin (CIPRO) IVPB 400 mg  400 mg Intravenous Q24H Laren Everts, RPH      . dextrose 10 % infusion   Intravenous Continuous Reyne Dumas, MD 25 mL/hr at 09/07/16 0958    . hydrALAZINE (APRESOLINE) injection 10 mg  10 mg Intravenous Q4H PRN Rise Patience, MD      . octreotide (SANDOSTATIN) 500 mcg in sodium chloride 0.9 % 250 mL (2 mcg/mL) infusion  50 mcg/hr Intravenous Continuous Rise Patience, MD 25 mL/hr at 09/07/16 0812 50 mcg/hr at 09/07/16 5170  . pantoprazole (PROTONIX) 80 mg in sodium chloride 0.9 % 250 mL (0.32 mg/mL) infusion  8 mg/hr Intravenous Continuous Rise Patience, MD 25 mL/hr at 09/07/16 0216 8 mg/hr at 09/07/16 0216  . [START ON 09/10/2016] pantoprazole (PROTONIX) injection 40 mg  40 mg Intravenous Q12H Rise Patience, MD       Current Outpatient Prescriptions  Medication Sig Dispense Refill  . albuterol (PROVENTIL) (2.5 MG/3ML) 0.083% nebulizer  solution Take 3 mLs (2.5 mg total) by nebulization every 6 (six) hours as needed for wheezing or shortness of breath. 150 mL 3  . amLODipine (NORVASC) 10 MG tablet Take 10 mg by mouth at bedtime.  6  . hydrALAZINE (APRESOLINE) 10 MG tablet Take 1 tablet (10 mg total) by mouth 3 (three) times daily. 90 tablet 3  . metoprolol tartrate (LOPRESSOR) 25 MG tablet Take 25 mg by mouth 2 (two) times daily.  6  . omeprazole (PRILOSEC) 20 MG capsule Take 1 capsule (20 mg total) by mouth daily. 14 capsule 0  . pantoprazole (PROTONIX) 20 MG tablet Take 1 tablet (20 mg total) by mouth 2 (two) times daily. 60 tablet 11  . azithromycin (ZITHROMAX Z-PAK) 250 MG tablet Take 2 pills today, then 1 pill daily until gone. (Patient not  taking: Reported on 09/06/2016) 6 tablet 0  . chlorpheniramine-HYDROcodone (TUSSIONEX PENNKINETIC ER) 10-8 MG/5ML SUER Take 5 mLs by mouth every 12 (twelve) hours as needed for cough. (Patient not taking: Reported on 09/06/2016) 140 mL 0  . hydrOXYzine (VISTARIL) 25 MG capsule Take 1 capsule (25 mg total) by mouth 3 (three) times daily as needed for itching. (Patient not taking: Reported on 09/06/2016) 30 capsule 2  . ipratropium (ATROVENT) 0.06 % nasal spray Place 2 sprays into both nostrils 4 (four) times daily. 15 mL 0  . methylPREDNISolone (MEDROL DOSEPAK) 4 MG TBPK tablet Take 6-5-4-3-2-1 po qd (Patient not taking: Reported on 09/06/2016) 21 tablet 0  . mirtazapine (REMERON) 15 MG tablet Take 1 tablet (15 mg total) by mouth at bedtime. (Patient not taking: Reported on 09/06/2016) 30 tablet 2  . predniSONE (DELTASONE) 50 MG tablet Take 1 pill daily for 5 days. (Patient not taking: Reported on 09/06/2016) 5 tablet 0   Labs: Basic Metabolic Panel:  Recent Labs Lab 09/06/16 1623 09/07/16 0408  NA 139 138  K 4.9 4.4  CL 97* 97*  CO2 27 25  GLUCOSE 82 82  BUN 65* 73*  CREATININE 8.18* 8.88*  CALCIUM 9.3 9.1   Liver Function Tests:  Recent Labs Lab 09/06/16 1623  AST 35  ALT 26   ALKPHOS 68  BILITOT 0.6  PROT 6.6  ALBUMIN 3.5   CBC:  Recent Labs Lab 09/06/16 1623 09/07/16 0408 09/07/16 0946  WBC 7.1 7.8 7.3  HGB 9.3* 8.0* 7.6*  HCT 27.7* 23.8* 22.5*  MCV 93.6 93.0 93.4  PLT 198 179 164   CBG:  Recent Labs Lab 09/07/16 0850  GLUCAP 66   Studies/Results: Dg Abdomen Acute W/chest  Result Date: 09/06/2016 CLINICAL DATA:  Epigastric pain and rectal bleeding EXAM: DG ABDOMEN ACUTE W/ 1V CHEST COMPARISON:  CXR 05/17/2016 FINDINGS: Stable cardiomegaly with aortic atherosclerosis. Minimal blunting of the right costophrenic angle consistent with mild pleural thickening or trace pleural effusion. No overt pulmonary edema, pneumonic consolidation or pneumothorax. Vascular clips are seen within the medial right arm. Unremarkable bowel gas pattern. Aortoiliac and branch vessel atherosclerosis. No organomegaly is noted. Phlebolith is seen in the pelvis. Degenerative changes are noted along the dorsal spine. IMPRESSION: Stable cardiomegaly with chronic minimal blunting the right costophrenic angle possibly representing a chronic lower effusion or pleural thickening. Unremarkable bowel gas pattern. CT with enteric contrast may help for further evaluation of the patient's rectal bleeding as deemed clinically necessary. Electronically Signed   By: Ashley Royalty M.D.   On: 09/06/2016 23:50    ROS: As per HPI otherwise negative. Sometimes vague on details.  Physical Exam: Vitals:   09/07/16 0830 09/07/16 0900 09/07/16 1000 09/07/16 1030  BP: (!) 160/98 (!) 159/91 138/88 (!) 135/91  Pulse: 88 86 83 83  Resp: 19 19 15 20   Temp:      TempSrc:      SpO2: 96% 95% 98% 96%  Weight:      Height:         General: WDWN  AAF NAD Head: NCAT sclera muddy MMM Neck: Supple. Neck veins full. Lungs:  Scattered coarse BS  Breathing is unlabored. Heart: RRR with S1 S2.  Abdomen: soft NT + BS Lower extremities: without edema or ischemic changes, no open wounds  Neuro: A & O  X 3.  Moves all extremities spontaneously. Psych:  Responds to questions appropriately with a normal affect. Dialysis Access: right upper AVF + bruit  Dialysis Orders:  TTS [facility] 3.75 hours 400/600 @ K 2 Ca EDW 57.5 - no heparin due to prior GIB, profile 2 Mircera 75 q 4 weeks - last 4/5 venofer 50/week hectorol 4 right upper AVF Recent labs: hgb 11.8 4/5 stable, 43% sat and iPTH 243 - Ca/P ok  Assessment/Plan: 1. Acute GIB - work up per primary  2. ABLA - transfuse 2 units of PRBC on HD today - 4 gm drop since last outpt hgb on 4/5-  3. ESRD -  TTS - HD today; continues on no heparin HD 4. Hypertension/volume  - should be on norvasc 10 and MTP 25 bid as outpt on prn IV hydralazine- titrate edw while here 5. hep C + with hx of cirrhosis 6. Metabolic bone disease -  Well controlled with outpt meds/cont Hectorol 7. Nutrition -npo for now 8. Dialysis and medication noncompliance at times- tends to stay on tmts no more than 3 hr but did run about 3.75 hours last Sat with post HD wt of 57.9 and BP 193/109 standing; suspect non adherence to BP meds as outpt  Myriam Jacobson, PA-C [place] [phone] 09/07/2016, 11:18 AM  I have seen and examined this patient and agree with plan per Amalia Hailey.  55yo BF comes to ER after vomiting coffee ground material, also states that stools have been black.  Has hx of this in the past.  Currently denies any abd pain.  Normally TTS HD at [facility].  Will plan HD today and follow Hg.Marland Kitchen Pearlee Arvizu T,MD 09/07/2016 12:35 PM

## 2016-09-07 NOTE — ED Notes (Signed)
Dr. Allyson Sabal notified of pt hemoglobin 7.6

## 2016-09-07 NOTE — Progress Notes (Signed)
Pharmacy Antibiotic Consult Note  Karen Morrow is a 56 y.o. female admitted on 09/06/2016 with acute GIB and intra-abdominal infection.  Pharmacy has been consulted for Cipro dosing.  PlanCipro  Cipro 400mg  IV Q24H.  Height: 5\' 7"  (170.2 cm) Weight: 132 lb (59.9 kg) IBW/kg (Calculated) : 61.6  Temp (24hrs), Avg:97.8 F (36.6 C), Min:97.7 F (36.5 C), Max:98 F (36.7 C)   Recent Labs Lab 09/06/16 1623 09/06/16 2323 09/07/16 0408  WBC 7.1  --  7.8  CREATININE 8.18*  --  8.88*  LATICACIDVEN  --  1.09  --     Estimated Creatinine Clearance: 6.7 mL/min (A) (by C-G formula based on SCr of 8.88 mg/dL (H)).    Allergies  Allergen Reactions  . Cefazolin     Severe thrombocytopenia     Thank you for allowing pharmacy to be a part of this patient's care.  Wynona Neat, PharmD, BCPS  09/07/2016 6:09 AM

## 2016-09-07 NOTE — ED Notes (Signed)
Patient moved into hospital bed from ED stretcher for comfort while waiting for inpatient bed by EDT [facility] and this RN

## 2016-09-07 NOTE — Progress Notes (Signed)
Received patient from Hemodialysis for admission to [facility].

## 2016-09-07 NOTE — H&P (Addendum)
History and Physical    Karen Morrow YSA:[id number] DOB: [birth year] DOA: 09/06/2016  PCP: Minerva Ends, MD  Patient coming from: Home.  Chief Complaint: Vomiting.  HPI: Karen Morrow is a 56 y.o. female with history of ESRD on hemodialysis on Tuesday Thursday and Saturday, hypertension and previous history of gastric ulcer per the EGD in 2015 presents to the ER to patient has been having vomiting. Patient states she was throwing up coffee-ground last 2 days. Patient also has been having epigastric pain. Denies using any NSAIDs or aspirin.   ED Course: In the ER patient had a large movement which was dark and loose. Stool for occult blood was positive. Patient's hemoglobin is around 9 which is a drop of 1 g from the previous. Patient also complains of epigastric pain acute abdominal series and LFTs were unremarkable.  Review of Systems: As per HPI, rest all negative.   Past Medical History:  Diagnosis Date  . Anginal pain ([place])   . Asthma   . Chronic kidney disease    dialysis 3x wk  . Hepatitis C antibody test positive   . Hypertension    "just dx'd today" (11/03/2012)    Past Surgical History:  Procedure Laterality Date  . ABDOMINAL HYSTERECTOMY     "partial" (11/03/2012)  . AV FISTULA PLACEMENT Right 12/03/2013   Procedure: CREATION OF ARTERIOVENOUS (AV) FISTULA RIGHT ARM ;  Surgeon: Rosetta Posner, MD;  Location: [place];  Service: Vascular;  Laterality: Right;  . BASCILIC VEIN TRANSPOSITION Right 03/06/2014   Procedure: SECOND STAGE BASILIC VEIN TRANSPOSITION;  Surgeon: Rosetta Posner, MD;  Location: [facility] OR;  Service: Vascular;  Laterality: Right;  . ESOPHAGOGASTRODUODENOSCOPY N/A 06/05/2013   Procedure: ESOPHAGOGASTRODUODENOSCOPY (EGD);  Surgeon: Winfield Cunas., MD;  Location: [facility];  Service: Endoscopy;  Laterality: N/A;     reports that she has been smoking Cigarettes.  She has a 10.00 pack-year smoking history. She has never used smokeless tobacco. She reports  that she uses drugs, including Marijuana. She reports that she does not drink alcohol.  Allergies  Allergen Reactions  . Cefazolin     Severe thrombocytopenia    Family History  Problem Relation Age of Onset  . Lupus Mother   . Diabetes Father   . Heart attack Father     Prior to Admission medications   Medication Sig Start Date End Date Taking? Authorizing Provider  albuterol (PROVENTIL) (2.5 MG/3ML) 0.083% nebulizer solution Take 3 mLs (2.5 mg total) by nebulization every 6 (six) hours as needed for wheezing or shortness of breath. 04/16/16  Yes Josalyn Funches, MD  amLODipine (NORVASC) 10 MG tablet Take 10 mg by mouth at bedtime. 08/27/16  Yes Historical Provider, MD  hydrALAZINE (APRESOLINE) 10 MG tablet Take 1 tablet (10 mg total) by mouth 3 (three) times daily. 04/16/16  Yes Josalyn Funches, MD  metoprolol tartrate (LOPRESSOR) 25 MG tablet Take 25 mg by mouth 2 (two) times daily. 08/27/16  Yes Historical Provider, MD  omeprazole (PRILOSEC) 20 MG capsule Take 1 capsule (20 mg total) by mouth daily. 08/25/16  Yes Lysbeth Penner, FNP  pantoprazole (PROTONIX) 20 MG tablet Take 1 tablet (20 mg total) by mouth 2 (two) times daily. 04/30/16  Yes Josalyn Funches, MD  azithromycin (ZITHROMAX Z-PAK) 250 MG tablet Take 2 pills today, then 1 pill daily until gone. Patient not taking: Reported on 09/06/2016 04/21/16   Melony Overly, MD  chlorpheniramine-HYDROcodone [facility] ER) 10-8 MG/5ML Latanya Presser  Take 5 mLs by mouth every 12 (twelve) hours as needed for cough. Patient not taking: Reported on 09/06/2016 04/21/16   Melony Overly, MD  hydrOXYzine (VISTARIL) 25 MG capsule Take 1 capsule (25 mg total) by mouth 3 (three) times daily as needed for itching. Patient not taking: Reported on 09/06/2016 12/11/14   Boykin Nearing, MD  ipratropium (ATROVENT) 0.06 % nasal spray Place 2 sprays into both nostrils 4 (four) times daily. 08/25/16   Lysbeth Penner, FNP  methylPREDNISolone (MEDROL DOSEPAK) 4 MG  TBPK tablet Take 6-5-4-3-2-1 po qd Patient not taking: Reported on 09/06/2016 08/25/16   Lysbeth Penner, FNP  mirtazapine (REMERON) 15 MG tablet Take 1 tablet (15 mg total) by mouth at bedtime. Patient not taking: Reported on 09/06/2016 04/16/16   Boykin Nearing, MD  predniSONE (DELTASONE) 50 MG tablet Take 1 pill daily for 5 days. Patient not taking: Reported on 09/06/2016 04/21/16   Melony Overly, MD    Physical Exam: Vitals:   09/06/16 1845 09/06/16 2202 09/06/16 2315 09/07/16 0100  BP: (!) 162/95 (!) 172/106 (!) 169/96 (!) 145/88  Pulse: 87 89 83 84  Resp: 19 16 12  (!) 22  Temp: 97.8 F (36.6 C) 97.7 F (36.5 C)    TempSrc: Oral Oral    SpO2: 100% 94% 99% 96%  Weight:      Height:          Constitutional: Moderately built and poorly nourished. Vitals:   09/06/16 1845 09/06/16 2202 09/06/16 2315 09/07/16 0100  BP: (!) 162/95 (!) 172/106 (!) 169/96 (!) 145/88  Pulse: 87 89 83 84  Resp: 19 16 12  (!) 22  Temp: 97.8 F (36.6 C) 97.7 F (36.5 C)    TempSrc: Oral Oral    SpO2: 100% 94% 99% 96%  Weight:      Height:       Eyes: Anicteric. No pallor. ENMT: No discharge from the ears eyes nose and mouth. Neck: No mass felt. No neck rigidity. Respiratory: No rhonchi or crepitations. Cardiovascular: S1 and S2 heard no murmurs appreciated. Abdomen: Soft nontender bowel sounds present. Musculoskeletal: No edema. No joint effusion. Skin: No rash. Skin appears warm. Neurologic: Alert awake oriented to time place and person. Moves all extremities. Psychiatric: Appears normal. Normal affect.   Labs on Admission: I have personally reviewed following labs and imaging studies  CBC:  Recent Labs Lab 09/06/16 1623  WBC 7.1  HGB 9.3*  HCT 27.7*  MCV 93.6  PLT 315   Basic Metabolic Panel:  Recent Labs Lab 09/06/16 1623  NA 139  K 4.9  CL 97*  CO2 27  GLUCOSE 82  BUN 65*  CREATININE 8.18*  CALCIUM 9.3   GFR: Estimated Creatinine Clearance: 7.3 mL/min (A) (by C-G  formula based on SCr of 8.18 mg/dL (H)). Liver Function Tests:  Recent Labs Lab 09/06/16 1623  AST 35  ALT 26  ALKPHOS 68  BILITOT 0.6  PROT 6.6  ALBUMIN 3.5   No results for input(s): LIPASE, AMYLASE in the last 168 hours. No results for input(s): AMMONIA in the last 168 hours. Coagulation Profile:  Recent Labs Lab 09/07/16 0002  INR 1.06   Cardiac Enzymes: No results for input(s): CKTOTAL, CKMB, CKMBINDEX, TROPONINI in the last 168 hours. BNP (last 3 results) No results for input(s): PROBNP in the last 8760 hours. HbA1C: No results for input(s): HGBA1C in the last 72 hours. CBG: No results for input(s): GLUCAP in the last 168 hours. Lipid Profile: No  results for input(s): CHOL, HDL, LDLCALC, TRIG, CHOLHDL, LDLDIRECT in the last 72 hours. Thyroid Function Tests: No results for input(s): TSH, T4TOTAL, FREET4, T3FREE, THYROIDAB in the last 72 hours. Anemia Panel: No results for input(s): VITAMINB12, FOLATE, FERRITIN, TIBC, IRON, RETICCTPCT in the last 72 hours. Urine analysis:    Component Value Date/Time   COLORURINE YELLOW 11/26/2013 1819   APPEARANCEUR TURBID (A) 11/26/2013 1819   LABSPEC 1.021 11/26/2013 1819   PHURINE 5.5 11/26/2013 1819   GLUCOSEU NEGATIVE 11/26/2013 1819   HGBUR SMALL (A) 11/26/2013 1819   BILIRUBINUR NEGATIVE 11/26/2013 1819   KETONESUR NEGATIVE 11/26/2013 1819   PROTEINUR >300 (A) 11/26/2013 1819   UROBILINOGEN 0.2 11/26/2013 1819   NITRITE POSITIVE (A) 11/26/2013 1819   LEUKOCYTESUR MODERATE (A) 11/26/2013 1819   Sepsis Labs: @LABRCNTIP (procalcitonin:4,lacticidven:4) )No results found for this or any previous visit (from the past 240 hour(s)).   Radiological Exams on Admission: Dg Abdomen Acute W/chest  Result Date: 09/06/2016 CLINICAL DATA:  Epigastric pain and rectal bleeding EXAM: DG ABDOMEN ACUTE W/ 1V CHEST COMPARISON:  CXR 05/17/2016 FINDINGS: Stable cardiomegaly with aortic atherosclerosis. Minimal blunting of the right  costophrenic angle consistent with mild pleural thickening or trace pleural effusion. No overt pulmonary edema, pneumonic consolidation or pneumothorax. Vascular clips are seen within the medial right arm. Unremarkable bowel gas pattern. Aortoiliac and branch vessel atherosclerosis. No organomegaly is noted. Phlebolith is seen in the pelvis. Degenerative changes are noted along the dorsal spine. IMPRESSION: Stable cardiomegaly with chronic minimal blunting the right costophrenic angle possibly representing a chronic lower effusion or pleural thickening. Unremarkable bowel gas pattern. CT with enteric contrast may help for further evaluation of the patient's rectal bleeding as deemed clinically necessary. Electronically Signed   By: Ashley Royalty M.D.   On: 09/06/2016 23:50    Assessment/Plan Principal Problem:   Acute GI bleeding Active Problems:   HTN (hypertension)   End stage renal disease on dialysis ([place])    1. Acute GI bleeding - given the history of gastric ulcer per the EGD in 2015 and also possible cirrhosis per the ultrasound done in 2014 patient is being placed on Protonix infusion and also octreotide. Follow CBC closely. Patient has received 1 unit of PRBC in the ER. Will keep patient on empiric antibiotics since patient has possible cirrhosis. 2. Acute blood loss anemia - follow CBC. 3. ESRD on hemodialysis - on Tuesday Thursday since Saturday. Consult nephrologist. 4. Hypertension - since patient is nothing by mouth Will keep patient on when necessary IV hydralazine.   DVT prophylaxis: SCDs. Code Status: Full code.  Family Communication: Discussed with patient.  Disposition Plan: Home.  Consults called: None.  Admission status: Inpatient.    Rise Patience MD [facility] Pager [phone].  If 7PM-7AM, please contact night-coverage [email or web address] Password [email or web address]  09/07/2016, 2:01 AM

## 2016-09-07 NOTE — Progress Notes (Signed)
Patient seen and examined    Karen Morrow is a 56 y.o. female with history of ESRD on hemodialysis on Tuesday Thursday and Saturday, hypertension and previous history of gastric ulcer per the EGD in 2015 presents to the ER to patient has been having vomiting. Patient states she was throwing up coffee-ground last 2 days, patient also presented with a cough and sinus drainage. She also complained of vomiting dark red blood and having dark stools along with abdominal cramping. Last hemodialysis was on Saturday as per schedule  Assessment and plan    1. Acute GI bleeding - given the history of gastric ulcer per the EGD in 2015 and also possible cirrhosis per the ultrasound done in 2014 patient is being placed on Protonix infusion and also octreotide. Hemoglobin drop from 9.32 8.0. Patient has received 1 unit of PRBC in the ER. Will keep patient on empiric antibiotics since patient has possible cirrhosis. Follow CBC. INR 1.06. FOBT positive. Liliane Shi notifed  2. Acute blood loss anemia - follow CBC. 3. ESRD on hemodialysis - on Tuesday Thursday since Saturday. Consulted Dr. Mercy Moore 4. Hypertension - since patient is nothing by mouth Will keep patient on when necessary IV hydralazine. 5. Hypoglycemia-CBG 66, continue Accu-Cheks, started on D10

## 2016-09-08 ENCOUNTER — Encounter (HOSPITAL_COMMUNITY)
Admission: EM | Disposition: A | Discharge status: Home / Self Care | Payer: Self-pay | Source: Home / Self Care | Attending: Internal Medicine

## 2016-09-08 ENCOUNTER — Encounter (HOSPITAL_COMMUNITY): Payer: Self-pay | Admitting: *Deleted

## 2016-09-08 DIAGNOSIS — K259 Gastric ulcer, unspecified as acute or chronic, without hemorrhage or perforation: Secondary | ICD-10-CM

## 2016-09-08 DIAGNOSIS — K920 Hematemesis: Principal | ICD-10-CM

## 2016-09-08 HISTORY — PX: ESOPHAGOGASTRODUODENOSCOPY: SHX5428

## 2016-09-08 LAB — CBC
HCT: 27.1 % — ABNORMAL LOW (ref 36.0–46.0)
HCT: 30.6 % — ABNORMAL LOW (ref 36.0–46.0)
Hemoglobin: 9.3 g/dL — ABNORMAL LOW (ref 12.0–15.0)
Hemoglobin: 10.2 g/dL — ABNORMAL LOW (ref 12.0–15.0)
MCH: 31.5 pg (ref 26.0–34.0)
MCH: 30.8 pg (ref 26.0–34.0)
MCHC: 34.3 g/dL (ref 30.0–36.0)
MCHC: 33.3 g/dL (ref 30.0–36.0)
MCV: 91.9 fL (ref 78.0–100.0)
MCV: 92.4 fL (ref 78.0–100.0)
Platelets: 113 10*3/uL — ABNORMAL LOW (ref 150–400)
Platelets: 147 10*3/uL — ABNORMAL LOW (ref 150–400)
RBC: 2.95 MIL/uL — ABNORMAL LOW (ref 3.87–5.11)
RBC: 3.31 MIL/uL — ABNORMAL LOW (ref 3.87–5.11)
RDW: 16 % — ABNORMAL HIGH (ref 11.5–15.5)
RDW: 16.1 % — ABNORMAL HIGH (ref 11.5–15.5)
WBC: 6 10*3/uL (ref 4.0–10.5)
WBC: 5.3 10*3/uL (ref 4.0–10.5)

## 2016-09-08 LAB — GLUCOSE, CAPILLARY
Glucose-Capillary: 105 mg/dL — ABNORMAL HIGH (ref 65–99)
Glucose-Capillary: 102 mg/dL — ABNORMAL HIGH (ref 65–99)
Glucose-Capillary: 110 mg/dL — ABNORMAL HIGH (ref 65–99)

## 2016-09-08 LAB — RENAL FUNCTION PANEL
Albumin: 3.2 g/dL — ABNORMAL LOW (ref 3.5–5.0)
Anion gap: 12 (ref 5–15)
BUN: 20 mg/dL (ref 6–20)
CO2: 25 mmol/L (ref 22–32)
Calcium: 8.9 mg/dL (ref 8.9–10.3)
Chloride: 98 mmol/L — ABNORMAL LOW (ref 101–111)
Creatinine, Ser: 4.85 mg/dL — ABNORMAL HIGH (ref 0.44–1.00)
GFR calc Af Amer: 11 mL/min — ABNORMAL LOW (ref >60)
GFR calc non Af Amer: 9 mL/min — ABNORMAL LOW (ref >60)
Glucose, Bld: 104 mg/dL — ABNORMAL HIGH (ref 65–99)
Phosphorus: 4.6 mg/dL (ref 2.5–4.6)
Potassium: 4.3 mmol/L (ref 3.5–5.1)
Sodium: 135 mmol/L (ref 135–145)

## 2016-09-08 LAB — BPAM RBC
Blood Product Expiration Date: 201805032359
Blood Product Expiration Date: 201805032359
ISSUE DATE / TIME: 201804101418
ISSUE DATE / TIME: 201804101418
Unit Type and Rh: 5100
Unit Type and Rh: 5100

## 2016-09-08 LAB — COMPREHENSIVE METABOLIC PANEL
ALT: 23 U/L (ref 14–54)
AST: 38 U/L (ref 15–41)
Albumin: 3 g/dL — ABNORMAL LOW (ref 3.5–5.0)
Alkaline Phosphatase: 53 U/L (ref 38–126)
Anion gap: 9 (ref 5–15)
BUN: 17 mg/dL (ref 6–20)
CO2: 28 mmol/L (ref 22–32)
Calcium: 8.3 mg/dL — ABNORMAL LOW (ref 8.9–10.3)
Chloride: 99 mmol/L — ABNORMAL LOW (ref 101–111)
Creatinine, Ser: 4.3 mg/dL — ABNORMAL HIGH (ref 0.44–1.00)
GFR calc Af Amer: 12 mL/min — ABNORMAL LOW (ref >60)
GFR calc non Af Amer: 11 mL/min — ABNORMAL LOW (ref >60)
Glucose, Bld: 100 mg/dL — ABNORMAL HIGH (ref 65–99)
Potassium: 3.8 mmol/L (ref 3.5–5.1)
Sodium: 136 mmol/L (ref 135–145)
Total Bilirubin: 0.8 mg/dL (ref 0.3–1.2)
Total Protein: 5.7 g/dL — ABNORMAL LOW (ref 6.5–8.1)

## 2016-09-08 LAB — HEPATITIS PANEL, ACUTE
HCV Ab: >11 s/co ratio — ABNORMAL HIGH (ref 0.0–0.9)
Hep A IgM: NEGATIVE
Hep B C IgM: NEGATIVE
Hepatitis B Surface Ag: NEGATIVE

## 2016-09-08 LAB — TYPE AND SCREEN
ABO/RH(D): O POS
Antibody Screen: NEGATIVE
Unit division: 0
Unit division: 0

## 2016-09-08 LAB — MRSA PCR SCREENING: MRSA by PCR: NEGATIVE

## 2016-09-08 SURGERY — EGD (ESOPHAGOGASTRODUODENOSCOPY)
Anesthesia: Moderate Sedation

## 2016-09-08 MED ORDER — MIDAZOLAM HCL 5 MG/ML IJ SOLN
INTRAMUSCULAR | Status: AC
  Filled 2016-09-08: qty 2

## 2016-09-08 MED ORDER — MIDAZOLAM HCL 10 MG/2ML IJ SOLN
INTRAMUSCULAR | Status: DC | PRN
  Administered 2016-09-08 (×2): 2 mg via INTRAVENOUS
  Administered 2016-09-08: 1 mg via INTRAVENOUS

## 2016-09-08 MED ORDER — LIDOCAINE-PRILOCAINE 2.5-2.5 % EX CREA: 1.0000 "application " | TOPICAL_CREAM | CUTANEOUS | Status: DC | PRN

## 2016-09-08 MED ORDER — RENA-VITE PO TABS
1.0000 | ORAL_TABLET | Freq: Every day | ORAL | Status: DC
  Administered 2016-09-08: 1 via ORAL
  Filled 2016-09-08: qty 1

## 2016-09-08 MED ORDER — AMLODIPINE BESYLATE 10 MG PO TABS
10.0000 mg | ORAL_TABLET | Freq: Every day | ORAL | Status: DC
  Administered 2016-09-08 – 2016-09-09 (×2): 10 mg via ORAL
  Filled 2016-09-08 (×2): qty 1

## 2016-09-08 MED ORDER — SODIUM CHLORIDE 0.9 % IV SOLN
8.0000 mg/h | INTRAVENOUS | Status: DC
  Administered 2016-09-08 – 2016-09-09 (×3): 8 mg/h via INTRAVENOUS
  Filled 2016-09-08 (×4): qty 80

## 2016-09-08 MED ORDER — FENTANYL CITRATE (PF) 100 MCG/2ML IJ SOLN
INTRAMUSCULAR | Status: AC
  Filled 2016-09-08: qty 2

## 2016-09-08 MED ORDER — SODIUM CHLORIDE 0.9 % IV SOLN: 100.0000 mL | INTRAVENOUS | Status: DC | PRN

## 2016-09-08 MED ORDER — LIDOCAINE HCL (PF) 1 % IJ SOLN: 5.0000 mL | INTRAMUSCULAR | Status: DC | PRN

## 2016-09-08 MED ORDER — ONDANSETRON HCL 4 MG/2ML IJ SOLN
4.0000 mg | Freq: Four times a day (QID) | INTRAMUSCULAR | Status: DC | PRN
  Administered 2016-09-08: 4 mg via INTRAVENOUS
  Filled 2016-09-08: qty 2

## 2016-09-08 MED ORDER — PENTAFLUOROPROP-TETRAFLUOROETH EX AERO: 1.0000 "application " | INHALATION_SPRAY | CUTANEOUS | Status: DC | PRN

## 2016-09-08 MED ORDER — ALTEPLASE 2 MG IJ SOLR: 2.0000 mg | Freq: Once | INTRAMUSCULAR | Status: DC | PRN

## 2016-09-08 MED ORDER — METOPROLOL TARTRATE 25 MG PO TABS
25.0000 mg | ORAL_TABLET | Freq: Two times a day (BID) | ORAL | Status: DC
  Administered 2016-09-08 – 2016-09-09 (×3): 25 mg via ORAL
  Filled 2016-09-08 (×3): qty 1

## 2016-09-08 MED ORDER — LORAZEPAM 2 MG/ML IJ SOLN
INTRAMUSCULAR | Status: DC
Start: 2016-09-08 — End: 2016-09-08
  Filled 2016-09-08: qty 1

## 2016-09-08 MED ORDER — FENTANYL CITRATE (PF) 100 MCG/2ML IJ SOLN
INTRAMUSCULAR | Status: DC | PRN
  Administered 2016-09-08 (×3): 25 ug via INTRAVENOUS

## 2016-09-08 NOTE — Consult Note (Signed)
[facility] CM Primary Care Navigator  09/08/2016  Karen Morrow Oct 02, 1960 [id number]    Met with patientand husband Yehuda Savannah) at the bedside to identify possible discharge needs. Patient reports having vomiting episodes (dark emesis) and black colored stools that had led to this admission. Patient endorses Dr. Boykin Nearing with [facility] as the primary care provider.   Patient shared using [facility] and [facility] [place] obtain medications without difficulty.  Patient states managing her medications at home with husband's assistance.  She reports using [employer] for transportation to his doctors' appointments and [employer] transportation to dialysis on Tuesdays, Thursdays and Saturdays.  Patient's husband is the primary caregiver at home and her sister Kennyth Lose) can also provide assistance with care if needed.   Anticipated discharge plan is home according topatient.   Patient voiced understanding to call primary care provider's office when she gets back home, for a post discharge follow-up appointment within a week or sooner if needs arise.Patient letter (with PCP's contact number) was provided as areminder.  Patient or husband denies anyfurther needs or concerns at this time.  For additional questions please contact:  Edwena Felty A. Saanvika Vazques, BSN, RN-BC [facility] PRIMARY CARE Navigator Cell: [phone]

## 2016-09-08 NOTE — H&P (View-Only) (Signed)
Referring Provider: [facility] Primary Care Physician:  Minerva Ends, MD Primary Gastroenterologist:  unassigned  Reason for Consultation: GI bleed  ASSESSMENT AND PLAN:    37. 56 yo female admitted with GI bleed (hematemesis, melena). Rule out recurrent PUD. She has a history of a large deep gastric ulcer in 2015.,  biopsies negative for h.pylori . No malignancy. She didn't get a follow up EGD as recommended by [facility]. No recent NSAID use.  -for further evaluation patient will be scheduled for EGD. The risks and benefits of EGD were discussed and the patient agrees to proceed.  -continue PPI, she is on a gtt -monitor CBC. Hgb should stabilize since bleeding has stopped -clear liquids are okay. NPO after MN  2. HCV, viral load > 1.5 million in 2015. No recent abdominal imaging but liver appeared normal on CTscan in 2014.  No varices or portal gastropathy on EGD in January 2015, coags normal. Platelets low end of normal range.  3. ESRD, on HD.    HPI: Karen Morrow is a 56 y.o. female with HCV and CKD on HD. She presented to ED last night with hematemesis. She developed acute upper abdominal pain a couple of days ago. On Sunday she began vomiting black emesis and reports black stools as well. No oral iron of bismuth use. Prior to onset of these symptoms patient states she had been feeling fine. Denies NSAID use. She had a large gastric ulcer in 2015 but never followed up wit [facility] for repeat EGD. Denies hx of liver disease or hx of heavy ETOH use. Baseline hgb mid 9, down to 7.6 overnight. No further hematemesis. She reports passage of small very dark stool this am. She takes a PPI at home.   Past Medical History:  Diagnosis Date  . Anginal pain ([place])   . Asthma   . Chronic kidney disease    dialysis 3x wk  . Hepatitis C antibody test positive   . Hypertension    "just dx'd today" (11/03/2012)    Past Surgical History:  Procedure Laterality Date  . ABDOMINAL  HYSTERECTOMY     "partial" (11/03/2012)  . AV FISTULA PLACEMENT Right 12/03/2013   Procedure: CREATION OF ARTERIOVENOUS (AV) FISTULA RIGHT ARM ;  Surgeon: Rosetta Posner, MD;  Location: [facility];  Service: Vascular;  Laterality: Right;  . BASCILIC VEIN TRANSPOSITION Right 03/06/2014   Procedure: SECOND STAGE BASILIC VEIN TRANSPOSITION;  Surgeon: Rosetta Posner, MD;  Location[facility] OR;  Service: Vascular;  Laterality: Right;  . ESOPHAGOGASTRODUODENOSCOPY N/A 06/05/2013   Procedure: ESOPHAGOGASTRODUODENOSCOPY (EGD);  Surgeon: Winfield Cunas., MD;  Location: [facility];  Service: Endoscopy;  Laterality: N/A;    Prior to Admission medications   Medication Sig Start Date End Date Taking? Authorizing Provider  albuterol (PROVENTIL) (2.5 MG/3ML) 0.083% nebulizer solution Take 3 mLs (2.5 mg total) by nebulization every 6 (six) hours as needed for wheezing or shortness of breath. 04/16/16  Yes Josalyn Funches, MD  amLODipine (NORVASC) 10 MG tablet Take 10 mg by mouth at bedtime. 08/27/16  Yes Historical Provider, MD  hydrALAZINE (APRESOLINE) 10 MG tablet Take 1 tablet (10 mg total) by mouth 3 (three) times daily. 04/16/16  Yes Josalyn Funches, MD  metoprolol tartrate (LOPRESSOR) 25 MG tablet Take 25 mg by mouth 2 (two) times daily. 08/27/16  Yes Historical Provider, MD  omeprazole (PRILOSEC) 20 MG capsule Take 1 capsule (20 mg total) by mouth daily. 08/25/16  Yes Lysbeth Penner, FNP  pantoprazole (PROTONIX) 20 MG tablet Take 1 tablet (20 mg total) by mouth 2 (two) times daily. 04/30/16  Yes Josalyn Funches, MD  azithromycin (ZITHROMAX Z-PAK) 250 MG tablet Take 2 pills today, then 1 pill daily until gone. Patient not taking: Reported on 09/06/2016 04/21/16   Melony Overly, MD  chlorpheniramine-HYDROcodone [facility] ([facility] ER) 10-8 MG/5ML SUER Take 5 mLs by mouth every 12 (twelve) hours as needed for cough. Patient not taking: Reported on 09/06/2016 04/21/16   Melony Overly, MD  hydrOXYzine (VISTARIL) 25 MG capsule  Take 1 capsule (25 mg total) by mouth 3 (three) times daily as needed for itching. Patient not taking: Reported on 09/06/2016 12/11/14   Boykin Nearing, MD  ipratropium (ATROVENT) 0.06 % nasal spray Place 2 sprays into both nostrils 4 (four) times daily. 08/25/16   Lysbeth Penner, FNP  methylPREDNISolone (MEDROL DOSEPAK) 4 MG TBPK tablet Take 6-5-4-3-2-1 po qd Patient not taking: Reported on 09/06/2016 08/25/16   Lysbeth Penner, FNP  mirtazapine (REMERON) 15 MG tablet Take 1 tablet (15 mg total) by mouth at bedtime. Patient not taking: Reported on 09/06/2016 04/16/16   Boykin Nearing, MD  predniSONE (DELTASONE) 50 MG tablet Take 1 pill daily for 5 days. Patient not taking: Reported on 09/06/2016 04/21/16   Melony Overly, MD    Current Facility-Administered Medications  Medication Dose Route Frequency Provider Last Rate Last Dose  . 0.9 %  sodium chloride infusion   Intravenous Once Reyne Dumas, MD      . acetaminophen (TYLENOL) tablet 650 mg  650 mg Oral Q6H PRN Rise Patience, MD       Or  . acetaminophen (TYLENOL) suppository 650 mg  650 mg Rectal Q6H PRN Rise Patience, MD      . Derrill Memo ON 09/08/2016] ciprofloxacin (CIPRO) IVPB 400 mg  400 mg Intravenous Q24H Laren Everts, RPH      . dextrose 10 % infusion   Intravenous Continuous Reyne Dumas, MD 25 mL/hr at 09/07/16 0958    . doxercalciferol (HECTOROL) injection 4 mcg  4 mcg Intravenous Q T,Th,Sa-HD Alric Seton, PA-C      . [START ON 09/09/2016] ferric gluconate (NULECIT) 125 mg in sodium chloride 0.9 % 100 mL IVPB  125 mg Intravenous Q Thu-HD Alric Seton, PA-C      . hydrALAZINE (APRESOLINE) injection 10 mg  10 mg Intravenous Q4H PRN Rise Patience, MD      . octreotide (SANDOSTATIN) 500 mcg in sodium chloride 0.9 % 250 mL (2 mcg/mL) infusion  50 mcg/hr Intravenous Continuous Rise Patience, MD 25 mL/hr at 09/07/16 0812 50 mcg/hr at 09/07/16 3154  . pantoprazole (PROTONIX) 80 mg in sodium chloride 0.9 % 250 mL (0.32  mg/mL) infusion  8 mg/hr Intravenous Continuous Rise Patience, MD 25 mL/hr at 09/07/16 0216 8 mg/hr at 09/07/16 0216  . [START ON 09/10/2016] pantoprazole (PROTONIX) injection 40 mg  40 mg Intravenous Q12H Rise Patience, MD       Current Outpatient Prescriptions  Medication Sig Dispense Refill  . albuterol (PROVENTIL) (2.5 MG/3ML) 0.083% nebulizer solution Take 3 mLs (2.5 mg total) by nebulization every 6 (six) hours as needed for wheezing or shortness of breath. 150 mL 3  . amLODipine (NORVASC) 10 MG tablet Take 10 mg by mouth at bedtime.  6  . hydrALAZINE (APRESOLINE) 10 MG tablet Take 1 tablet (10 mg total) by mouth 3 (three) times daily. 90 tablet 3  . metoprolol tartrate (LOPRESSOR) 25  MG tablet Take 25 mg by mouth 2 (two) times daily.  6  . omeprazole (PRILOSEC) 20 MG capsule Take 1 capsule (20 mg total) by mouth daily. 14 capsule 0  . pantoprazole (PROTONIX) 20 MG tablet Take 1 tablet (20 mg total) by mouth 2 (two) times daily. 60 tablet 11  . azithromycin (ZITHROMAX Z-PAK) 250 MG tablet Take 2 pills today, then 1 pill daily until gone. (Patient not taking: Reported on 09/06/2016) 6 tablet 0  . chlorpheniramine-HYDROcodone (TUSSIONEX PENNKINETIC ER) 10-8 MG/5ML SUER Take 5 mLs by mouth every 12 (twelve) hours as needed for cough. (Patient not taking: Reported on 09/06/2016) 140 mL 0  . hydrOXYzine (VISTARIL) 25 MG capsule Take 1 capsule (25 mg total) by mouth 3 (three) times daily as needed for itching. (Patient not taking: Reported on 09/06/2016) 30 capsule 2  . ipratropium (ATROVENT) 0.06 % nasal spray Place 2 sprays into both nostrils 4 (four) times daily. 15 mL 0  . methylPREDNISolone (MEDROL DOSEPAK) 4 MG TBPK tablet Take 6-5-4-3-2-1 po qd (Patient not taking: Reported on 09/06/2016) 21 tablet 0  . mirtazapine (REMERON) 15 MG tablet Take 1 tablet (15 mg total) by mouth at bedtime. (Patient not taking: Reported on 09/06/2016) 30 tablet 2  . predniSONE (DELTASONE) 50 MG tablet Take 1 pill  daily for 5 days. (Patient not taking: Reported on 09/06/2016) 5 tablet 0    Allergies as of 09/06/2016 - Review Complete 09/06/2016  Allergen Reaction Noted  . Cefazolin  12/05/2013    Family History  Problem Relation Age of Onset  . Lupus Mother   . Diabetes Father   . Heart attack Father     Social History   Social History  . Marital status: Married    Spouse name: N/A  . Number of children: N/A  . Years of education: N/A   Occupational History  . Not on file.   Social History Main Topics  . Smoking status: Light Tobacco Smoker    Packs/day: 0.50    Years: 20.00    Types: Cigarettes  . Smokeless tobacco: Never Used  . Alcohol use 0.6 oz/week    1 Glasses of wine per week     Comment: 09/07/16-pt reports less than 1 glass of wine/week. 11/03/2012 "1-2 40oz beers/day maybe"   11/17/15 "I don;t drink beer anymore since Ive been on dialysis". p  . Drug use: Yes    Types: Marijuana     Comment: 11/03/2012 "maybe once or twice/wk" 11/17/15 "I haven't smoked in 3-4 months"  . Sexual activity: Not Currently   Other Topics Concern  . Not on file   Social History Narrative  . No narrative on file    Review of Systems: All systems reviewed and negative except where noted in HPI.  Physical Exam: Vital signs in last 24 hours: Temp:  [97.7 F (36.5 C)-98 F (36.7 C)] 97.7 F (36.5 C) (04/09 2202) Pulse Rate:  [80-94] 82 (04/10 1400) Resp:  [12-24] 21 (04/10 1400) BP: (133-172)/(86-106) 134/91 (04/10 1400) SpO2:  [93 %-100 %] 96 % (04/10 1319) FiO2 (%):  [0 %] 0 % (04/10 0200) Weight:  [125 lb (56.7 kg)-132 lb (59.9 kg)] 125 lb (56.7 kg) (04/10 1319)   General:   Alert, thin, black female in NAD on dialysis Eyes:  Sclera clear, no icterus.   Conjunctiva pink. Ears:  Normal auditory acuity. Nose:  No deformity, discharge,  or lesions. Neck:  Supple; no masses  Lungs:  Clear throughout to auscultation.  No wheezes, crackles, or rhonchi.  Heart:  Regular rate and  rhythm; no murmurs, clicks, rubs,  or gallops. Abdomen:  Soft,nontender, BS active,nonpalp mass or hsm.   Rectal:  Scant amount of dry dark brown residue  Msk:  Symmetrical without gross deformities. . Extremities:  Without clubbing or edema. Neurologic:  Alert and  oriented x4;  grossly normal neurologically. Skin:  Intact without significant lesions or rashes.. Psych:  Alert and cooperative. Normal mood and affect.  Intake/Output from previous day: 04/09 0701 - 04/10 0700 In: 100 [IV Piggyback:100] Out: 1 [Stool:1] Intake/Output this shift: Total I/O In: 200 [IV Piggyback:200] Out: -   Lab Results:  Recent Labs  09/06/16 1623 09/07/16 0408 09/07/16 0946  WBC 7.1 7.8 7.3  HGB 9.3* 8.0* 7.6*  HCT 27.7* 23.8* 22.5*  PLT 198 179 164   BMET  Recent Labs  09/06/16 1623 09/07/16 0408  NA 139 138  K 4.9 4.4  CL 97* 97*  CO2 27 25  GLUCOSE 82 82  BUN 65* 73*  CREATININE 8.18* 8.88*  CALCIUM 9.3 9.1   LFT  Recent Labs  09/06/16 1623  PROT 6.6  ALBUMIN 3.5  AST 35  ALT 26  ALKPHOS 68  BILITOT 0.6   PT/INR  Recent Labs  09/07/16 0002  LABPROT 13.9  INR 1.06    Studies/Results: Dg Abdomen Acute W/chest  Result Date: 09/06/2016 CLINICAL DATA:  Epigastric pain and rectal bleeding EXAM: DG ABDOMEN ACUTE W/ 1V CHEST COMPARISON:  CXR 05/17/2016 FINDINGS: Stable cardiomegaly with aortic atherosclerosis. Minimal blunting of the right costophrenic angle consistent with mild pleural thickening or trace pleural effusion. No overt pulmonary edema, pneumonic consolidation or pneumothorax. Vascular clips are seen within the medial right arm. Unremarkable bowel gas pattern. Aortoiliac and branch vessel atherosclerosis. No organomegaly is noted. Phlebolith is seen in the pelvis. Degenerative changes are noted along the dorsal spine. IMPRESSION: Stable cardiomegaly with chronic minimal blunting the right costophrenic angle possibly representing a chronic lower effusion or  pleural thickening. Unremarkable bowel gas pattern. CT with enteric contrast may help for further evaluation of the patient's rectal bleeding as deemed clinically necessary. Electronically Signed   By: Ashley Royalty M.D.   On: 09/06/2016 23:50    Tye Savoy, NP-C @  09/07/2016, 2:23 PM  Pager number [phone]  ________________________________________________________________________  [name] GI MD note:  I personally examined the patient, reviewed the data and agree with the assessment and plan described above.  Planning on EGD tomorrow morning for UGI bleeding.  Doubt ongoing, active bleeding currently.   Owens Loffler, MD [facility] Gastroenterology Pager [phone]

## 2016-09-08 NOTE — Progress Notes (Addendum)
[facility] PROGRESS NOTE  Karen Morrow IDP:[id number] DOB: [birth year] DOA: 09/06/2016   PCP: Minerva Ends, MD     Assessment/Plan: Principal Problem:   Acute GI bleeding Active Problems:   HTN (hypertension)   End stage renal disease on dialysis [facility])   GI bleed   Karen P Nettlesis a 56 y.o.femalewith history of ESRD on hemodialysis on Tuesday Thursday and Saturday, hypertension and previous history of gastric ulcer per the EGD in 2015 presents to the ER to patient has been having vomiting. Patient states she was throwing up coffee-ground last 2 days, patient also presented with a cough and sinus drainage. She also complained of vomiting dark red blood and having dark stools along with abdominal cramping. Last hemodialysis was on Saturday as per schedule  Assessment and plan    1. Acute GI bleeding - given the history of gastric ulcer per the EGD in 2015 and also possible cirrhosis per the ultrasound done in 2014 patient is placed on Protonix infusion,discontinued octreotide per GI recommendations . hgb 7.6 to 9.3 after 2 units of PRBC on HD 4/10 . nonbleeding gastric ulcer on EGD this am ,continue PPI iv  for another 24 hrs per GI , then BID PO PPI  2. Acute blood loss anemia - follow CBC. 3. ESRD on hemodialysis - on Tuesday Thursday since Saturday. Consulted Dr. Mercy Moore 4. Hypertension - since patient is nothing by mouth Will keep patient on when necessary IV hydralazine. 5. Hypoglycemia-CBG 66, continue Accu-Cheks, started on D10, but now off since she will be eating      DVT prophylaxsis scd  Code Status:  Full code     Family Communication: Discussed in detail with the patient, all imaging results, lab results explained to the patient   Disposition Plan:  Dc in am if hg stable      Consultants:  GI  nephrology  Procedures:  None   Antibiotics: Anti-infectives    Start     Dose/Rate Route Frequency Ordered Stop   09/08/16  0600  ciprofloxacin (CIPRO) IVPB 400 mg  Status:  Discontinued     400 mg 200 mL/hr over 60 Minutes Intravenous Every 24 hours 09/07/16 0612 09/08/16 1258   09/07/16 0615  ciprofloxacin (CIPRO) IVPB 400 mg     400 mg 200 mL/hr over 60 Minutes Intravenous  Once 09/07/16 0612 09/07/16 0811         HPI/Subjective: No further episodes of bleeding   Objective: Vitals:   09/08/16 0805 09/08/16 0810 09/08/16 0815 09/08/16 0921  BP: (!) 151/95 (!) 144/92 138/90 (!) 158/93  Pulse: 82 78 81 77  Resp: 19 19 (!) 21 20  Temp: 98 F (36.7 C)   98.2 F (36.8 C)  TempSrc: Oral   Oral  SpO2: 97% 95% 94% 94%  Weight:      Height:        Intake/Output Summary (Last 24 hours) at 09/08/16 1258 Last data filed at 09/08/16 1208  Gross per 24 hour  Intake          2848.33 ml  Output             1000 ml  Net          1848.33 ml    Exam:  Examination:  General exam: Appears calm and comfortable  Respiratory system: Clear to auscultation. Respiratory effort normal. Cardiovascular system: S1 & S2 heard, RRR. No JVD, murmurs, rubs, gallops or clicks. No pedal  edema. Gastrointestinal system: Abdomen is nondistended, soft and nontender. No organomegaly or masses felt. Normal bowel sounds heard. Central nervous system: Alert and oriented. No focal neurological deficits. Extremities: Symmetric 5 x 5 power. Skin: No rashes, lesions or ulcers Psychiatry: Judgement and insight appear normal. Mood & affect appropriate.     Data Reviewed: I have personally reviewed following labs and imaging studies  Micro Results Recent Results (from the past 240 hour(s))  MRSA PCR Screening     Status: None   Collection Time: 09/07/16  8:20 PM  Result Value Ref Range Status   MRSA by PCR NEGATIVE NEGATIVE Final    Comment:        The GeneXpert MRSA Assay (FDA approved for NASAL specimens only), is one component of a comprehensive MRSA colonization surveillance program. It is not intended to diagnose  MRSA infection nor to guide or monitor treatment for MRSA infections.     Radiology Reports Dg Abdomen Acute W/chest  Result Date: 09/06/2016 CLINICAL DATA:  Epigastric pain and rectal bleeding EXAM: DG ABDOMEN ACUTE W/ 1V CHEST COMPARISON:  CXR 05/17/2016 FINDINGS: Stable cardiomegaly with aortic atherosclerosis. Minimal blunting of the right costophrenic angle consistent with mild pleural thickening or trace pleural effusion. No overt pulmonary edema, pneumonic consolidation or pneumothorax. Vascular clips are seen within the medial right arm. Unremarkable bowel gas pattern. Aortoiliac and branch vessel atherosclerosis. No organomegaly is noted. Phlebolith is seen in the pelvis. Degenerative changes are noted along the dorsal spine. IMPRESSION: Stable cardiomegaly with chronic minimal blunting the right costophrenic angle possibly representing a chronic lower effusion or pleural thickening. Unremarkable bowel gas pattern. CT with enteric contrast may help for further evaluation of the patient's rectal bleeding as deemed clinically necessary. Electronically Signed   By: Ashley Royalty M.D.   On: 09/06/2016 23:50     CBC  Recent Labs Lab 09/07/16 0408 09/07/16 0946 09/07/16 1921 09/08/16 0119 09/08/16 1057  WBC 7.8 7.3 5.7 6.0 5.3  HGB 8.0* 7.6* 10.0* 9.3* 10.2*  HCT 23.8* 22.5* 29.3* 27.1* 30.6*  PLT 179 164 132* 113* 147*  MCV 93.0 93.4 91.6 91.9 92.4  MCH 31.3 31.5 31.3 31.5 30.8  MCHC 33.6 33.8 34.1 34.3 33.3  RDW 16.2* 16.3* 15.4 16.0* 16.1*    Chemistries   Recent Labs Lab 09/06/16 1623 09/07/16 0408 09/08/16 0336 09/08/16 1057  NA 139 138 136 135  K 4.9 4.4 3.8 4.3  CL 97* 97* 99* 98*  CO2 27 25 28 25   GLUCOSE 82 82 100* 104*  BUN 65* 73* 17 20  CREATININE 8.18* 8.88* 4.30* 4.85*  CALCIUM 9.3 9.1 8.3* 8.9  AST 35  --  38  --   ALT 26  --  23  --   ALKPHOS 68  --  53  --   BILITOT 0.6  --  0.8  --     ------------------------------------------------------------------------------------------------------------------ estimated creatinine clearance is 11.6 mL/min (A) (by C-G formula based on SCr of 4.85 mg/dL (H)). ------------------------------------------------------------------------------------------------------------------ No results for input(s): HGBA1C in the last 72 hours. ------------------------------------------------------------------------------------------------------------------ No results for input(s): CHOL, HDL, LDLCALC, TRIG, CHOLHDL, LDLDIRECT in the last 72 hours. ------------------------------------------------------------------------------------------------------------------ No results for input(s): TSH, T4TOTAL, T3FREE, THYROIDAB in the last 72 hours.  Invalid input(s): FREET3 ------------------------------------------------------------------------------------------------------------------ No results for input(s): VITAMINB12, FOLATE, FERRITIN, TIBC, IRON, RETICCTPCT in the last 72 hours.  Coagulation profile  Recent Labs Lab 09/07/16 0002  INR 1.06    No results for input(s): DDIMER in the last 72 hours.  Cardiac  Enzymes No results for input(s): CKMB, TROPONINI, MYOGLOBIN in the last 168 hours.  Invalid input(s): CK ------------------------------------------------------------------------------------------------------------------ Invalid input(s): POCBNP   CBG:  Recent Labs Lab 09/07/16 0850 09/07/16 2034 09/07/16 2358 09/08/16 0419 09/08/16 0843  GLUCAP 66 153* 117* 105* 102*       Studies: Dg Abdomen Acute W/chest  Result Date: 09/06/2016 CLINICAL DATA:  Epigastric pain and rectal bleeding EXAM: DG ABDOMEN ACUTE W/ 1V CHEST COMPARISON:  CXR 05/17/2016 FINDINGS: Stable cardiomegaly with aortic atherosclerosis. Minimal blunting of the right costophrenic angle consistent with mild pleural thickening or trace pleural effusion. No overt pulmonary  edema, pneumonic consolidation or pneumothorax. Vascular clips are seen within the medial right arm. Unremarkable bowel gas pattern. Aortoiliac and branch vessel atherosclerosis. No organomegaly is noted. Phlebolith is seen in the pelvis. Degenerative changes are noted along the dorsal spine. IMPRESSION: Stable cardiomegaly with chronic minimal blunting the right costophrenic angle possibly representing a chronic lower effusion or pleural thickening. Unremarkable bowel gas pattern. CT with enteric contrast may help for further evaluation of the patient's rectal bleeding as deemed clinically necessary. Electronically Signed   By: Ashley Royalty M.D.   On: 09/06/2016 23:50      Lab Results  Component Value Date   HGBA1C 4.7 11/26/2013   Lab Results  Component Value Date   LDLCALC 46 12/11/2014   CREATININE 4.85 (H) 09/08/2016       Scheduled Meds: . sodium chloride   Intravenous Once  . amLODipine  10 mg Oral Daily  . [START ON 09/09/2016] doxercalciferol  4 mcg Intravenous Q T,Th,Sa-HD  . [START ON 09/09/2016] ferric gluconate (FERRLECIT/NULECIT) IV  125 mg Intravenous Q Thu-HD  . metoprolol tartrate  25 mg Oral BID  . multivitamin  1 tablet Oral QHS  . [START ON 09/10/2016] pantoprazole  40 mg Intravenous Q12H   Continuous Infusions: . pantoprozole (PROTONIX) infusion 8 mg/hr (09/08/16 0117)     LOS: 1 day    Time spent: >30 MINS    Reyne Dumas  [facility] Pager [phone]. If 7PM-7AM, please contact night-coverage at [email or web address], password [email or web address] 09/08/2016, 12:58 PM  LOS: 1 day

## 2016-09-08 NOTE — Op Note (Signed)
[facility] Patient Name: Karen Morrow Procedure Date : 09/08/2016 MRN: [id number] Attending MD: Milus Banister , MD Date of Birth: 05/07/1961 CSN: [id number] Age: 56 Admit Type: Inpatient Procedure:                Upper GI endoscopy Indications:              Hematemesis, Melena; large gatric ulcer 2016 [facility] EGD, she never followed up with them Providers:                Milus Banister, MD, Carolynn Comment, RN, Cherylynn Ridges, Technician Referring MD:              Medicines:                Fentanyl 75 micrograms IV, Midazolam 5 mg IV Complications:            No immediate complications. Estimated blood loss:                            None. Estimated Blood Loss:     Estimated blood loss: none. Procedure:                Pre-Anesthesia Assessment:                           - Prior to the procedure, a History and Physical                            was performed, and patient medications and                            allergies were reviewed. The patient's tolerance of                            previous anesthesia was also reviewed. The risks                            and benefits of the procedure and the sedation                            options and risks were discussed with the patient.                            All questions were answered, and informed consent                            was obtained. Prior Anticoagulants: The patient has                            taken no previous anticoagulant or antiplatelet  agents. ASA Grade Assessment: IV - A patient with                            severe systemic disease that is a constant threat                            to life. After reviewing the risks and benefits,                            the patient was deemed in satisfactory condition to                            undergo the procedure.                           After obtaining  informed consent, the endoscope was                            passed under direct vision. Throughout the                            procedure, the patient's blood pressure, pulse, and                            oxygen saturations were monitored continuously. The                            EG-[id number] ([id number]) scope was introduced through the                            mouth, and advanced to the second part of duodenum.                            The upper GI endoscopy was accomplished without                            difficulty. The patient tolerated the procedure                            well. Scope In: Scope Out: Findings:      The esophagus was normal.      One non-bleeding cratered gastric ulcer with no stigmata of bleeding was       found at the pylorus. This was not overtly neoplastic appearing however       the surrounding mucosa was edematous, deformed. The lesion was 10 mm in       largest dimension. Biopsies were taken with a cold forceps for histology.      The examined duodenum was normal. Impression:               - Distal gastric ulcer. This is possibly the same                            ulcer noted by [facility] 2 years ago. It does not  appear overtly neoplastic, biopsies were taken to                            be certain. Moderate Sedation:      Moderate (conscious) sedation was administered by the endoscopy nurse       and supervised by the endoscopist. The following parameters were       monitored: oxygen saturation, heart rate, blood pressure, and response       to care. Total physician intraservice time was 15 minutes. Recommendation:           - Return patient to hospital ward for ongoing care.                           - Clear liquid diet for now.                           - Continue present medications. IV PPI drip for                            another 24 hours then OK to change to PO twice                            daily  (indefinitely).                           - Not sure why she was on octreotide drip but I                            stopped that. Procedure Code(s):        --- Professional ---                           [phone], Esophagogastroduodenoscopy, flexible,                            transoral; with biopsy, single or multiple Diagnosis Code(s):        --- Professional ---                           K25.9, Gastric ulcer, unspecified as acute or                            chronic, without hemorrhage or perforation                           K92.0, Hematemesis                           K92.1, Melena (includes Hematochezia) CPT copyright 2016 [employer]. All rights reserved. The codes documented in this report are preliminary and upon coder review may  be revised to meet current compliance requirements. Milus Banister, MD 09/08/2016 8:10:17 AM This report has been signed electronically. Number of Addenda: 0

## 2016-09-08 NOTE — Progress Notes (Signed)
[facility] Progress Note  Dialysis Orders:  TTS [facility] 3.75 hours 400/600 2 K 2 Ca EDW 57.5 - no heparin due to prior GIB, profile 2 Mircera 75 q 4 weeks - last 4/5 venofer 50/week hectorol 4 right upper AVF Recent labs: hgb 11.8 4/5 stable, 43% sat and iPTH 243 - Ca/P ok  Assessment/Plan: 1. Acute GIB -nonbleeding gastric ulcer on EGD this am - could be prior ulcer, bx done - plans per GI - on CL. Explained EGD results to patient and husband 2. ABLA - hgb 7.6 to 9.3 after 2 units of PRBC on HD 4/10 - Had 4 gm drop since last outpt hgb on 4/5- last ESA Mircera 75 q 4wks on 4/5 3. ESRD -  TTS - HD tomorrow- first round in case she can be d/c continues on no heparin HD 4. Hypertension/volume  - should be on norvasc 10 and MTP 25 bid as outpt on prn IV hydralazine- titrate edw while here- should be ok to change to po meds net UF 1 L with post wt 55.6 - lower edw at d/c - continue to titrate EDW down 5. hep C + with hx of cirrhosis 6. Metabolic bone disease -  Well controlled with outpt meds/cont Hectorol/resume binders when on solids 7. Nutrition -CL 8. Dialysis and medication noncompliance at times- tends to stay on tmts no more than 3 hr but did run about 3.75 hours last Sat with post HD wt of 57.9 and BP 193/109 standing; suspect non adherence to BP meds as outpt 9. ID - on Cipro - discussed with pharmacy - no clear indication   Myriam Jacobson, PA-C [facility] [phone] 09/08/2016,9:48 AM  LOS: 1 day  I have seen and examined this patient and agree with plan per Amalia Hailey.  Resume BP meds.  If no further drop in Hg then ? Home tomorrow after HD. Lina Hitch T,MD 09/08/2016 10:06 AM Subjective:   Wants cream in her coffee; no one is telling me anything Objective Vitals:   09/08/16 0805 09/08/16 0810 09/08/16 0815 09/08/16 0921  BP: (!) 151/95 (!) 144/92 138/90 (!) 158/93  Pulse: 82 78 81 77  Resp: 19 19 (!) 21 20  Temp: 98 F (36.7 C)    98.2 F (36.8 C)  TempSrc: Oral   Oral  SpO2: 97% 95% 94% 94%  Weight:      Height:       Physical Exam General: talkative NAD Heart: RRR Lungs: no rales Abdomen: soft NT Extremities: tr LE edema Dialysis Access: right upper AVF + bruit   Additional Objective Labs: Basic Metabolic Panel:  Recent Labs Lab 09/06/16 1623 09/07/16 0408 09/08/16 0336  NA 139 138 136  K 4.9 4.4 3.8  CL 97* 97* 99*  CO2 27 25 28   GLUCOSE 82 82 100*  BUN 65* 73* 17  CREATININE 8.18* 8.88* 4.30*  CALCIUM 9.3 9.1 8.3*   Liver Function Tests:  Recent Labs Lab 09/06/16 1623 09/08/16 0336  AST 35 38  ALT 26 23  ALKPHOS 68 53  BILITOT 0.6 0.8  PROT 6.6 5.7*  ALBUMIN 3.5 3.0*   No results for input(s): LIPASE, AMYLASE in the last 168 hours. CBC:  Recent Labs Lab 09/06/16 1623 09/07/16 0408 09/07/16 0946 09/07/16 1921 09/08/16 0119  WBC 7.1 7.8 7.3 5.7 6.0  HGB 9.3* 8.0* 7.6* 10.0* 9.3*  HCT 27.7* 23.8* 22.5* 29.3* 27.1*  MCV 93.6 93.0 93.4 91.6 91.9  PLT 198 179 164  132* 113*   Blood Culture    Component Value Date/Time   SDES STOOL 11/29/2013 2240   SDES STOOL 11/29/2013 2240   SDES STOOL 11/29/2013 2240   SPECREQUEST NONE 11/29/2013 2240   SPECREQUEST NONE 11/29/2013 2240   SPECREQUEST NONE 11/29/2013 2240   CULT  11/29/2013 2240    NO SALMONELLA, SHIGELLA, CAMPYLOBACTER, YERSINIA, OR E.COLI 0157:H7 ISOLATED Performed at [facility] 11/30/2013 FINAL 11/29/2013 2240   REPTSTATUS 12/03/2013 FINAL 11/29/2013 2240   REPTSTATUS 12/04/2013 FINAL 11/29/2013 2240    Cardiac Enzymes: No results for input(s): CKTOTAL, CKMB, CKMBINDEX, TROPONINI in the last 168 hours. CBG:  Recent Labs Lab 09/07/16 0850 09/07/16 2034 09/07/16 2358 09/08/16 0419 09/08/16 0843  GLUCAP 66 153* 117* 105* 102*   Iron Studies: No results for input(s): IRON, TIBC, TRANSFERRIN, FERRITIN in the last 72 hours. Lab Results  Component Value Date   INR 1.06 09/07/2016    INR 1.02 11/27/2013   INR 1.02 06/05/2013   Studies/Results: Dg Abdomen Acute W/chest  Result Date: 09/06/2016 CLINICAL DATA:  Epigastric pain and rectal bleeding EXAM: DG ABDOMEN ACUTE W/ 1V CHEST COMPARISON:  CXR 05/17/2016 FINDINGS: Stable cardiomegaly with aortic atherosclerosis. Minimal blunting of the right costophrenic angle consistent with mild pleural thickening or trace pleural effusion. No overt pulmonary edema, pneumonic consolidation or pneumothorax. Vascular clips are seen within the medial right arm. Unremarkable bowel gas pattern. Aortoiliac and branch vessel atherosclerosis. No organomegaly is noted. Phlebolith is seen in the pelvis. Degenerative changes are noted along the dorsal spine. IMPRESSION: Stable cardiomegaly with chronic minimal blunting the right costophrenic angle possibly representing a chronic lower effusion or pleural thickening. Unremarkable bowel gas pattern. CT with enteric contrast may help for further evaluation of the patient's rectal bleeding as deemed clinically necessary. Electronically Signed   By: Ashley Royalty M.D.   On: 09/06/2016 23:50   Medications: . dextrose 25 mL/hr at 09/08/16 0505  . pantoprozole (PROTONIX) infusion 8 mg/hr (09/08/16 0117)   . sodium chloride   Intravenous Once  . ciprofloxacin  400 mg Intravenous Q24H  . [START ON 09/09/2016] doxercalciferol  4 mcg Intravenous Q T,Th,Sa-HD  . [START ON 09/09/2016] ferric gluconate (FERRLECIT/NULECIT) IV  125 mg Intravenous Q Thu-HD  . [START ON 09/10/2016] pantoprazole  40 mg Intravenous Q12H

## 2016-09-08 NOTE — Interval H&P Note (Signed)
History and Physical Interval Note:  09/08/2016 7:36 AM  Karen Morrow  has presented today for surgery, with the diagnosis of hematemesis, melena  The various methods of treatment have been discussed with the patient and family. After consideration of risks, benefits and other options for treatment, the patient has consented to  Procedure(s): ESOPHAGOGASTRODUODENOSCOPY (EGD) (N/A) as a surgical intervention .  The patient's history has been reviewed, patient examined, no change in status, stable for surgery.  I have reviewed the patient's chart and labs.  Questions were answered to the patient's satisfaction.     Milus Banister

## 2016-09-09 LAB — CBC
HCT: 27.1 % — ABNORMAL LOW (ref 36.0–46.0)
Hemoglobin: 9.1 g/dL — ABNORMAL LOW (ref 12.0–15.0)
MCH: 31.3 pg (ref 26.0–34.0)
MCHC: 33.6 g/dL (ref 30.0–36.0)
MCV: 93.1 fL (ref 78.0–100.0)
Platelets: 128 10*3/uL — ABNORMAL LOW (ref 150–400)
RBC: 2.91 MIL/uL — ABNORMAL LOW (ref 3.87–5.11)
RDW: 16 % — ABNORMAL HIGH (ref 11.5–15.5)
WBC: 5.2 10*3/uL (ref 4.0–10.5)

## 2016-09-09 LAB — GLUCOSE, CAPILLARY: Glucose-Capillary: 110 mg/dL — ABNORMAL HIGH (ref 65–99)

## 2016-09-09 LAB — RENAL FUNCTION PANEL
Albumin: 3 g/dL — ABNORMAL LOW (ref 3.5–5.0)
Anion gap: 9 (ref 5–15)
BUN: 26 mg/dL — ABNORMAL HIGH (ref 6–20)
CO2: 25 mmol/L (ref 22–32)
Calcium: 8.5 mg/dL — ABNORMAL LOW (ref 8.9–10.3)
Chloride: 100 mmol/L — ABNORMAL LOW (ref 101–111)
Creatinine, Ser: 6.41 mg/dL — ABNORMAL HIGH (ref 0.44–1.00)
GFR calc Af Amer: 8 mL/min — ABNORMAL LOW (ref >60)
GFR calc non Af Amer: 7 mL/min — ABNORMAL LOW (ref >60)
Glucose, Bld: 101 mg/dL — ABNORMAL HIGH (ref 65–99)
Phosphorus: 5.5 mg/dL — ABNORMAL HIGH (ref 2.5–4.6)
Potassium: 4.2 mmol/L (ref 3.5–5.1)
Sodium: 134 mmol/L — ABNORMAL LOW (ref 135–145)

## 2016-09-09 MED ORDER — PANTOPRAZOLE SODIUM 40 MG PO TBEC: 40.0000 mg | DELAYED_RELEASE_TABLET | Freq: Two times a day (BID) | ORAL | Status: DC

## 2016-09-09 MED ORDER — DOXERCALCIFEROL 4 MCG/2ML IV SOLN
INTRAVENOUS | Status: AC
  Administered 2016-09-09: 4 ug via INTRAVENOUS
  Filled 2016-09-09: qty 2

## 2016-09-09 MED ORDER — HYDRALAZINE HCL 10 MG PO TABS: 20.0000 mg | ORAL_TABLET | Freq: Three times a day (TID) | ORAL | 3 refills | Status: DC

## 2016-09-09 MED ORDER — HYDRALAZINE HCL 20 MG/ML IJ SOLN
INTRAMUSCULAR | Status: AC
  Administered 2016-09-09: 20 mg
  Filled 2016-09-09: qty 1

## 2016-09-09 MED ORDER — PANTOPRAZOLE SODIUM 40 MG PO TBEC: 40.0000 mg | DELAYED_RELEASE_TABLET | Freq: Two times a day (BID) | ORAL | 1 refills | Status: DC

## 2016-09-09 MED FILL — hydrALAZINE HCL 10 MG TABS: 10 | 20 days supply | Qty: 120 | Fill #0

## 2016-09-09 MED FILL — PANTOPRAZOLE SOD DR 40 MG T: 40 | 30 days supply | Qty: 60 | Fill #0

## 2016-09-09 NOTE — Procedures (Signed)
Pt seen on HD.  Last 3 Hg 9.3,10.2,9.1.  No further passage of blood.  BP high on HD, will pull extra .5kg.  Ap 70 Vp 240.  She could go from renal standpoint

## 2016-09-09 NOTE — Progress Notes (Addendum)
Hydralazine was given in HD will recheck BP and continue to monitor.  1317Paged MD Abrol  Of BP  186/103.   1400 Hydralazine was given before DC per MD Abrol Order.   Paulla Fore, RN

## 2016-09-09 NOTE — Progress Notes (Signed)
Karen Morrow to be D/C'd Home per MD order.  Discussed prescriptions and follow up appointments with the patient. Prescriptions given to patient, medication list explained in detail. Pt verbalized understanding.  Allergies as of 09/09/2016      Reactions   Cefazolin    Severe thrombocytopenia      Medication List    STOP taking these medications   azithromycin 250 MG tablet Commonly known as:  ZITHROMAX Z-PAK   chlorpheniramine-HYDROcodone 10-8 MG/5ML Suer Commonly known as:  TUSSIONEX PENNKINETIC ER   methylPREDNISolone 4 MG Tbpk tablet Commonly known as:  MEDROL DOSEPAK   predniSONE 50 MG tablet Commonly known as:  DELTASONE     TAKE these medications   albuterol (2.5 MG/3ML) 0.083% nebulizer solution Commonly known as:  PROVENTIL Take 3 mLs (2.5 mg total) by nebulization every 6 (six) hours as needed for wheezing or shortness of breath.   amLODipine 10 MG tablet Commonly known as:  NORVASC Take 10 mg by mouth at bedtime.   hydrALAZINE 10 MG tablet Commonly known as:  APRESOLINE Take 2 tablets (20 mg total) by mouth 3 (three) times daily. What changed:  how much to take   hydrOXYzine 25 MG capsule Commonly known as:  VISTARIL Take 1 capsule (25 mg total) by mouth 3 (three) times daily as needed for itching.   ipratropium 0.06 % nasal spray Commonly known as:  ATROVENT Place 2 sprays into both nostrils 4 (four) times daily.   metoprolol tartrate 25 MG tablet Commonly known as:  LOPRESSOR Take 25 mg by mouth 2 (two) times daily.   mirtazapine 15 MG tablet Commonly known as:  REMERON Take 1 tablet (15 mg total) by mouth at bedtime.   omeprazole 20 MG capsule Commonly known as:  PRILOSEC Take 1 capsule (20 mg total) by mouth daily.   pantoprazole 40 MG tablet Commonly known as:  PROTONIX Take 1 tablet (40 mg total) by mouth 2 (two) times daily. What changed:  medication strength  how much to take       Vitals:   09/09/16 1404 09/09/16 1427  BP:   (!) 179/98  Pulse: 74 73  Resp:    Temp:      Skin clean, dry and intact without evidence of skin break down, no evidence of skin tears noted. IV catheter discontinued intact. Site without signs and symptoms of complications. Dressing and pressure applied. Pt denies pain at this time. No complaints noted.   An After Visit Summary was printed and given to the patient. Patient escorted via [place], and D/C home via private auto.  Emilio Math, RN [facility] Phone [phone]

## 2016-09-09 NOTE — Discharge Summary (Signed)
Physician Discharge Summary  Karen Morrow MRN: [id number] DOB/AGE: May 24, 1961 56 y.o.  PCP: Minerva Ends, MD   Admit date: 09/06/2016 Discharge date: 09/09/2016  Discharge Diagnoses:    Principal Problem:   Acute GI bleeding Active Problems:   HTN (hypertension)   End stage renal disease on dialysis [facility])   GI bleed    Follow-up recommendations Follow-up with PCP in 3-5 days , including all  additional recommended appointments as below Follow-up CBC, CMP in 3-5 days Patient would need to follow-up with GI when necessary, continue with Protonix twice a day indefinitely      Current Discharge Medication List    CONTINUE these medications which have CHANGED   Details  hydrALAZINE (APRESOLINE) 10 MG tablet Take 2 tablets (20 mg total) by mouth 3 (three) times daily. Qty: 120 tablet, Refills: 3   Associated Diagnoses: Essential hypertension    pantoprazole (PROTONIX) 40 MG tablet Take 1 tablet (40 mg total) by mouth 2 (two) times daily. Qty: 60 tablet, Refills: 1      CONTINUE these medications which have NOT CHANGED   Details  albuterol (PROVENTIL) (2.5 MG/3ML) 0.083% nebulizer solution Take 3 mLs (2.5 mg total) by nebulization every 6 (six) hours as needed for wheezing or shortness of breath. Qty: 150 mL, Refills: 3   Associated Diagnoses: Shortness of breath    amLODipine (NORVASC) 10 MG tablet Take 10 mg by mouth at bedtime. Refills: 6    metoprolol tartrate (LOPRESSOR) 25 MG tablet Take 25 mg by mouth 2 (two) times daily. Refills: 6    omeprazole (PRILOSEC) 20 MG capsule Take 1 capsule (20 mg total) by mouth daily. Qty: 14 capsule, Refills: 0    hydrOXYzine (VISTARIL) 25 MG capsule Take 1 capsule (25 mg total) by mouth 3 (three) times daily as needed for itching. Qty: 30 capsule, Refills: 2   Associated Diagnoses: Pruritus    ipratropium (ATROVENT) 0.06 % nasal spray Place 2 sprays into both nostrils 4 (four) times daily. Qty: 15 mL, Refills: 0     mirtazapine (REMERON) 15 MG tablet Take 1 tablet (15 mg total) by mouth at bedtime. Qty: 30 tablet, Refills: 2   Associated Diagnoses: Primary insomnia      STOP taking these medications     azithromycin (ZITHROMAX Z-PAK) 250 MG tablet      chlorpheniramine-HYDROcodone (TUSSIONEX PENNKINETIC ER) 10-8 MG/5ML SUER      methylPREDNISolone (MEDROL DOSEPAK) 4 MG TBPK tablet      predniSONE (DELTASONE) 50 MG tablet          Discharge Condition: Stable   Discharge Instructions Get Medicines reviewed and adjusted: Please take all your medications with you for your next visit with your Primary MD  Please request your Primary MD to go over all hospital tests and procedure/radiological results at the follow up, please ask your Primary MD to get all Hospital records sent to his/her office.  If you experience worsening of your admission symptoms, develop shortness of breath, life threatening emergency, suicidal or homicidal thoughts you must seek medical attention immediately by calling 911 or calling your MD immediately if symptoms less severe.  You must read complete instructions/literature along with all the possible adverse reactions/side effects for all the Medicines you take and that have been prescribed to you. Take any new Medicines after you have completely understood and accpet all the possible adverse reactions/side effects.   Do not drive when taking Pain medications.   Do not take more than prescribed Pain,  Sleep and Anxiety Medications  Special Instructions: If you have smoked or chewed Tobacco in the last 2 yrs please stop smoking, stop any regular Alcohol and or any Recreational drug use.  Wear Seat belts while driving.  Please note  You were cared for by a hospitalist during your hospital stay. Once you are discharged, your primary care physician will handle any further medical issues. Please note that NO REFILLS for any discharge medications will be authorized once  you are discharged, as it is imperative that you return to your primary care physician (or establish a relationship with a primary care physician if you do not have one) for your aftercare needs so that they can reassess your need for medications and monitor your lab values.  Discharge Instructions    Diet - low sodium heart healthy    Complete by:  As directed    Increase activity slowly    Complete by:  As directed        Allergies  Allergen Reactions  . Cefazolin     Severe thrombocytopenia      Disposition: 01-Home or Self Care   Consults:  GI Nephrology     Significant Diagnostic Studies:  Dg Abdomen Acute W/chest  Result Date: 09/06/2016 CLINICAL DATA:  Epigastric pain and rectal bleeding EXAM: DG ABDOMEN ACUTE W/ 1V CHEST COMPARISON:  CXR 05/17/2016 FINDINGS: Stable cardiomegaly with aortic atherosclerosis. Minimal blunting of the right costophrenic angle consistent with mild pleural thickening or trace pleural effusion. No overt pulmonary edema, pneumonic consolidation or pneumothorax. Vascular clips are seen within the medial right arm. Unremarkable bowel gas pattern. Aortoiliac and branch vessel atherosclerosis. No organomegaly is noted. Phlebolith is seen in the pelvis. Degenerative changes are noted along the dorsal spine. IMPRESSION: Stable cardiomegaly with chronic minimal blunting the right costophrenic angle possibly representing a chronic lower effusion or pleural thickening. Unremarkable bowel gas pattern. CT with enteric contrast may help for further evaluation of the patient's rectal bleeding as deemed clinically necessary. Electronically Signed   By: Ashley Royalty M.D.   On: 09/06/2016 23:50        Filed Weights   09/08/16 0725 09/08/16 2044 09/09/16 0710  Weight: 56.6 kg (124 lb 12.8 oz) 59.3 kg (130 lb 12.8 oz) 59.5 kg (131 lb 2.8 oz)     Microbiology: Recent Results (from the past 240 hour(s))  MRSA PCR Screening     Status: None   Collection Time:  09/07/16  8:20 PM  Result Value Ref Range Status   MRSA by PCR NEGATIVE NEGATIVE Final    Comment:        The GeneXpert MRSA Assay (FDA approved for NASAL specimens only), is one component of a comprehensive MRSA colonization surveillance program. It is not intended to diagnose MRSA infection nor to guide or monitor treatment for MRSA infections.        Blood Culture    Component Value Date/Time   SDES STOOL 11/29/2013 2240   SDES STOOL 11/29/2013 2240   SDES STOOL 11/29/2013 2240   SPECREQUEST NONE 11/29/2013 2240   SPECREQUEST NONE 11/29/2013 2240   SPECREQUEST NONE 11/29/2013 2240   CULT  11/29/2013 2240    NO SALMONELLA, SHIGELLA, CAMPYLOBACTER, YERSINIA, OR E.COLI 0157:[id number] ISOLATED Performed at [facility] 11/30/2013 FINAL 11/29/2013 2240   REPTSTATUS 12/03/2013 FINAL 11/29/2013 2240   REPTSTATUS 12/04/2013 FINAL 11/29/2013 2240      Labs: Results for orders placed or performed during the hospital encounter of 09/06/16 (from  the past 48 hour(s))  CBC     Status: Abnormal   Collection Time: 09/07/16  7:21 PM  Result Value Ref Range   WBC 5.7 4.0 - 10.5 K/uL   RBC 3.20 (L) 3.87 - 5.11 MIL/uL   Hemoglobin 10.0 (L) 12.0 - 15.0 g/dL    Comment: REPEATED TO VERIFY POST TRANSFUSION SPECIMEN    HCT 29.3 (L) 36.0 - 46.0 %   MCV 91.6 78.0 - 100.0 fL   MCH 31.3 26.0 - 34.0 pg   MCHC 34.1 30.0 - 36.0 g/dL   RDW 15.4 11.5 - 15.5 %   Platelets 132 (L) 150 - 400 K/uL  MRSA PCR Screening     Status: None   Collection Time: 09/07/16  8:20 PM  Result Value Ref Range   MRSA by PCR NEGATIVE NEGATIVE    Comment:        The GeneXpert MRSA Assay (FDA approved for NASAL specimens only), is one component of a comprehensive MRSA colonization surveillance program. It is not intended to diagnose MRSA infection nor to guide or monitor treatment for MRSA infections.   Glucose, capillary     Status: Abnormal   Collection Time: 09/07/16  8:34 PM   Result Value Ref Range   Glucose-Capillary 153 (H) 65 - 99 mg/dL  Glucose, capillary     Status: Abnormal   Collection Time: 09/07/16 11:58 PM  Result Value Ref Range   Glucose-Capillary 117 (H) 65 - 99 mg/dL  CBC     Status: Abnormal   Collection Time: 09/08/16  1:19 AM  Result Value Ref Range   WBC 6.0 4.0 - 10.5 K/uL   RBC 2.95 (L) 3.87 - 5.11 MIL/uL   Hemoglobin 9.3 (L) 12.0 - 15.0 g/dL   HCT 27.1 (L) 36.0 - 46.0 %   MCV 91.9 78.0 - 100.0 fL   MCH 31.5 26.0 - 34.0 pg   MCHC 34.3 30.0 - 36.0 g/dL   RDW 16.0 (H) 11.5 - 15.5 %   Platelets 113 (L) 150 - 400 K/uL    Comment: PLATELET COUNT CONFIRMED BY SMEAR  Comprehensive metabolic panel     Status: Abnormal   Collection Time: 09/08/16  3:36 AM  Result Value Ref Range   Sodium 136 135 - 145 mmol/L   Potassium 3.8 3.5 - 5.1 mmol/L   Chloride 99 (L) 101 - 111 mmol/L   CO2 28 22 - 32 mmol/L   Glucose, Bld 100 (H) 65 - 99 mg/dL   BUN 17 6 - 20 mg/dL   Creatinine, Ser 4.30 (H) 0.44 - 1.00 mg/dL   Calcium 8.3 (L) 8.9 - 10.3 mg/dL   Total Protein 5.7 (L) 6.5 - 8.1 g/dL   Albumin 3.0 (L) 3.5 - 5.0 g/dL   AST 38 15 - 41 U/L   ALT 23 14 - 54 U/L   Alkaline Phosphatase 53 38 - 126 U/L   Total Bilirubin 0.8 0.3 - 1.2 mg/dL   GFR calc non Af Amer 11 (L) >60 mL/min   GFR calc Af Amer 12 (L) >60 mL/min    Comment: (NOTE) The eGFR has been calculated using the CKD EPI equation. This calculation has not been validated in all clinical situations. eGFR's persistently <60 mL/min signify possible Chronic Kidney Disease.    Anion gap 9 5 - 15  Glucose, capillary     Status: Abnormal   Collection Time: 09/08/16  4:19 AM  Result Value Ref Range   Glucose-Capillary 105 (H) 65 - 99  mg/dL  Glucose, capillary     Status: Abnormal   Collection Time: 09/08/16  8:43 AM  Result Value Ref Range   Glucose-Capillary 102 (H) 65 - 99 mg/dL  Renal function panel     Status: Abnormal   Collection Time: 09/08/16 10:57 AM  Result Value Ref Range    Sodium 135 135 - 145 mmol/L   Potassium 4.3 3.5 - 5.1 mmol/L   Chloride 98 (L) 101 - 111 mmol/L   CO2 25 22 - 32 mmol/L   Glucose, Bld 104 (H) 65 - 99 mg/dL   BUN 20 6 - 20 mg/dL   Creatinine, Ser 4.85 (H) 0.44 - 1.00 mg/dL   Calcium 8.9 8.9 - 10.3 mg/dL   Phosphorus 4.6 2.5 - 4.6 mg/dL   Albumin 3.2 (L) 3.5 - 5.0 g/dL   GFR calc non Af Amer 9 (L) >60 mL/min   GFR calc Af Amer 11 (L) >60 mL/min    Comment: (NOTE) The eGFR has been calculated using the CKD EPI equation. This calculation has not been validated in all clinical situations. eGFR's persistently <60 mL/min signify possible Chronic Kidney Disease.    Anion gap 12 5 - 15  CBC     Status: Abnormal   Collection Time: 09/08/16 10:57 AM  Result Value Ref Range   WBC 5.3 4.0 - 10.5 K/uL   RBC 3.31 (L) 3.87 - 5.11 MIL/uL   Hemoglobin 10.2 (L) 12.0 - 15.0 g/dL   HCT 30.6 (L) 36.0 - 46.0 %   MCV 92.4 78.0 - 100.0 fL   MCH 30.8 26.0 - 34.0 pg   MCHC 33.3 30.0 - 36.0 g/dL   RDW 16.1 (H) 11.5 - 15.5 %   Platelets 147 (L) 150 - 400 K/uL  Glucose, capillary     Status: Abnormal   Collection Time: 09/08/16  5:12 PM  Result Value Ref Range   Glucose-Capillary 110 (H) 65 - 99 mg/dL  Glucose, capillary     Status: Abnormal   Collection Time: 09/09/16 12:10 AM  Result Value Ref Range   Glucose-Capillary 110 (H) 65 - 99 mg/dL  CBC Once     Status: Abnormal   Collection Time: 09/09/16  4:03 AM  Result Value Ref Range   WBC 5.2 4.0 - 10.5 K/uL   RBC 2.91 (L) 3.87 - 5.11 MIL/uL   Hemoglobin 9.1 (L) 12.0 - 15.0 g/dL   HCT 27.1 (L) 36.0 - 46.0 %   MCV 93.1 78.0 - 100.0 fL   MCH 31.3 26.0 - 34.0 pg   MCHC 33.6 30.0 - 36.0 g/dL   RDW 16.0 (H) 11.5 - 15.5 %   Platelets 128 (L) 150 - 400 K/uL  Renal function panel     Status: Abnormal   Collection Time: 09/09/16  7:23 AM  Result Value Ref Range   Sodium 134 (L) 135 - 145 mmol/L   Potassium 4.2 3.5 - 5.1 mmol/L   Chloride 100 (L) 101 - 111 mmol/L   CO2 25 22 - 32 mmol/L    Glucose, Bld 101 (H) 65 - 99 mg/dL   BUN 26 (H) 6 - 20 mg/dL   Creatinine, Ser 6.41 (H) 0.44 - 1.00 mg/dL   Calcium 8.5 (L) 8.9 - 10.3 mg/dL   Phosphorus 5.5 (H) 2.5 - 4.6 mg/dL   Albumin 3.0 (L) 3.5 - 5.0 g/dL   GFR calc non Af Amer 7 (L) >60 mL/min   GFR calc Af Amer 8 (L) >60 mL/min  Comment: (NOTE) The eGFR has been calculated using the CKD EPI equation. This calculation has not been validated in all clinical situations. eGFR's persistently <60 mL/min signify possible Chronic Kidney Disease.    Anion gap 9 5 - 15     Lipid Panel     Component Value Date/Time   CHOL 127 12/11/2014 1004   TRIG 122 12/11/2014 1004   HDL 57 12/11/2014 1004   CHOLHDL 2.2 12/11/2014 1004   VLDL 24 12/11/2014 1004   LDLCALC 46 12/11/2014 1004     Lab Results  Component Value Date   HGBA1C 4.7 11/26/2013     Lab Results  Component Value Date   LDLCALC 46 12/11/2014   CREATININE 6.41 (H) 09/09/2016     HPI :  Karen Morrow a 56 y.o.femalewith history of ESRD on hemodialysis on Tuesday Thursday and Saturday, hypertension and previous history of gastric ulcer per the EGD in 2015 presents to the ER to patient has been having vomiting. Patient states she was throwing up coffee-ground last 2 days,patient also presented with a cough and sinus drainage. She also complained of vomiting dark red blood and having dark stools along with abdominal cramping. Last hemodialysis was on Saturday as per schedule  HOSPITAL COURSE:   1. Acute GI bleeding - given the history of gastric ulcer per the EGD in 2015 and also possible cirrhosis per the ultrasound done in 2014 patient is placed on Protonix infusion,discontinued octreotide per GI recommendations . hgb 7.6 to 9.3 after2 units of PRBC on HD 4/10. nonbleeding gastric ulcer on EGD 09/08/16. Patient continued on Protonix drip for another 24 hours. Subsequently switched to Protonix 40 mg twice a day based on GI recommendations. Patient would need  to continue with Protonix indefinitely 2. Acute blood loss anemia - follow CBC. Status post 2 unit of packed red blood cells this admission. 3. ESRD on hemodialysis - on Tuesday Thursday since Saturday. Consulted Dr. Mercy Moore 4. Hypertension - since patient is nothing by mouth Will keep patient on when necessary IV hydralazine. 5. Hypoglycemia-CBG 66, continue Accu-Cheks, started on D10, but now off since she will be eating  Discharge Exam:   Blood pressure (!) 187/109, pulse 80, temperature 97.8 F (36.6 C), temperature source Oral, resp. rate 16, height _0  (1.702 m), weight 59.5 kg (131 lb 2.8 oz), SpO2 97 %.  General: talkative NAD Heart: RRR Lungs: no rales Abdomen: soft NT Extremities: tr LE edema Dialysis Access: right upper AVF + bruit      Signed: Reyne Dumas 09/09/2016, 12:39 PM        Time spent >45 mins

## 2016-09-10 ENCOUNTER — Encounter (HOSPITAL_COMMUNITY): Payer: Self-pay | Admitting: Gastroenterology

## 2016-09-11 DIAGNOSIS — N2581 Secondary hyperparathyroidism of renal origin: Secondary | ICD-10-CM | POA: Diagnosis not present

## 2016-09-11 DIAGNOSIS — D631 Anemia in chronic kidney disease: Secondary | ICD-10-CM | POA: Diagnosis not present

## 2016-09-11 DIAGNOSIS — D509 Iron deficiency anemia, unspecified: Secondary | ICD-10-CM | POA: Diagnosis not present

## 2016-09-11 DIAGNOSIS — K7460 Unspecified cirrhosis of liver: Secondary | ICD-10-CM | POA: Diagnosis not present

## 2016-09-11 DIAGNOSIS — N186 End stage renal disease: Secondary | ICD-10-CM | POA: Diagnosis not present

## 2016-09-14 DIAGNOSIS — D509 Iron deficiency anemia, unspecified: Secondary | ICD-10-CM | POA: Diagnosis not present

## 2016-09-14 DIAGNOSIS — N2581 Secondary hyperparathyroidism of renal origin: Secondary | ICD-10-CM | POA: Diagnosis not present

## 2016-09-14 DIAGNOSIS — N186 End stage renal disease: Secondary | ICD-10-CM | POA: Diagnosis not present

## 2016-09-14 DIAGNOSIS — K7460 Unspecified cirrhosis of liver: Secondary | ICD-10-CM | POA: Diagnosis not present

## 2016-09-14 DIAGNOSIS — D631 Anemia in chronic kidney disease: Secondary | ICD-10-CM | POA: Diagnosis not present

## 2016-09-16 DIAGNOSIS — D509 Iron deficiency anemia, unspecified: Secondary | ICD-10-CM | POA: Diagnosis not present

## 2016-09-16 DIAGNOSIS — N186 End stage renal disease: Secondary | ICD-10-CM | POA: Diagnosis not present

## 2016-09-16 DIAGNOSIS — D631 Anemia in chronic kidney disease: Secondary | ICD-10-CM | POA: Diagnosis not present

## 2016-09-16 DIAGNOSIS — K7460 Unspecified cirrhosis of liver: Secondary | ICD-10-CM | POA: Diagnosis not present

## 2016-09-16 DIAGNOSIS — N2581 Secondary hyperparathyroidism of renal origin: Secondary | ICD-10-CM | POA: Diagnosis not present

## 2016-09-21 DIAGNOSIS — N186 End stage renal disease: Secondary | ICD-10-CM | POA: Diagnosis not present

## 2016-09-21 DIAGNOSIS — D631 Anemia in chronic kidney disease: Secondary | ICD-10-CM | POA: Diagnosis not present

## 2016-09-21 DIAGNOSIS — K7460 Unspecified cirrhosis of liver: Secondary | ICD-10-CM | POA: Diagnosis not present

## 2016-09-21 DIAGNOSIS — N2581 Secondary hyperparathyroidism of renal origin: Secondary | ICD-10-CM | POA: Diagnosis not present

## 2016-09-21 DIAGNOSIS — D509 Iron deficiency anemia, unspecified: Secondary | ICD-10-CM | POA: Diagnosis not present

## 2016-09-23 DIAGNOSIS — N2581 Secondary hyperparathyroidism of renal origin: Secondary | ICD-10-CM | POA: Diagnosis not present

## 2016-09-23 DIAGNOSIS — D509 Iron deficiency anemia, unspecified: Secondary | ICD-10-CM | POA: Diagnosis not present

## 2016-09-23 DIAGNOSIS — K7460 Unspecified cirrhosis of liver: Secondary | ICD-10-CM | POA: Diagnosis not present

## 2016-09-23 DIAGNOSIS — N186 End stage renal disease: Secondary | ICD-10-CM | POA: Diagnosis not present

## 2016-09-23 DIAGNOSIS — D631 Anemia in chronic kidney disease: Secondary | ICD-10-CM | POA: Diagnosis not present

## 2016-09-24 ENCOUNTER — Inpatient Hospital Stay: Payer: Medicare Other | Admitting: Family Medicine

## 2016-09-25 DIAGNOSIS — D509 Iron deficiency anemia, unspecified: Secondary | ICD-10-CM | POA: Diagnosis not present

## 2016-09-25 DIAGNOSIS — N186 End stage renal disease: Secondary | ICD-10-CM | POA: Diagnosis not present

## 2016-09-25 DIAGNOSIS — D631 Anemia in chronic kidney disease: Secondary | ICD-10-CM | POA: Diagnosis not present

## 2016-09-25 DIAGNOSIS — N2581 Secondary hyperparathyroidism of renal origin: Secondary | ICD-10-CM | POA: Diagnosis not present

## 2016-09-25 DIAGNOSIS — K7460 Unspecified cirrhosis of liver: Secondary | ICD-10-CM | POA: Diagnosis not present

## 2016-09-27 DIAGNOSIS — I120 Hypertensive chronic kidney disease with stage 5 chronic kidney disease or end stage renal disease: Secondary | ICD-10-CM | POA: Diagnosis not present

## 2016-09-27 DIAGNOSIS — N186 End stage renal disease: Secondary | ICD-10-CM | POA: Diagnosis not present

## 2016-09-27 DIAGNOSIS — Z992 Dependence on renal dialysis: Secondary | ICD-10-CM | POA: Diagnosis not present

## 2016-09-28 DIAGNOSIS — N186 End stage renal disease: Secondary | ICD-10-CM | POA: Diagnosis not present

## 2016-09-28 DIAGNOSIS — N2581 Secondary hyperparathyroidism of renal origin: Secondary | ICD-10-CM | POA: Diagnosis not present

## 2016-09-28 DIAGNOSIS — D631 Anemia in chronic kidney disease: Secondary | ICD-10-CM | POA: Diagnosis not present

## 2016-09-28 DIAGNOSIS — D509 Iron deficiency anemia, unspecified: Secondary | ICD-10-CM | POA: Diagnosis not present

## 2016-09-30 DIAGNOSIS — D509 Iron deficiency anemia, unspecified: Secondary | ICD-10-CM | POA: Diagnosis not present

## 2016-09-30 DIAGNOSIS — N2581 Secondary hyperparathyroidism of renal origin: Secondary | ICD-10-CM | POA: Diagnosis not present

## 2016-09-30 DIAGNOSIS — D631 Anemia in chronic kidney disease: Secondary | ICD-10-CM | POA: Diagnosis not present

## 2016-09-30 DIAGNOSIS — N186 End stage renal disease: Secondary | ICD-10-CM | POA: Diagnosis not present

## 2016-10-02 DIAGNOSIS — N186 End stage renal disease: Secondary | ICD-10-CM | POA: Diagnosis not present

## 2016-10-02 DIAGNOSIS — N2581 Secondary hyperparathyroidism of renal origin: Secondary | ICD-10-CM | POA: Diagnosis not present

## 2016-10-02 DIAGNOSIS — D631 Anemia in chronic kidney disease: Secondary | ICD-10-CM | POA: Diagnosis not present

## 2016-10-02 DIAGNOSIS — D509 Iron deficiency anemia, unspecified: Secondary | ICD-10-CM | POA: Diagnosis not present

## 2016-10-05 DIAGNOSIS — N2581 Secondary hyperparathyroidism of renal origin: Secondary | ICD-10-CM | POA: Diagnosis not present

## 2016-10-05 DIAGNOSIS — N186 End stage renal disease: Secondary | ICD-10-CM | POA: Diagnosis not present

## 2016-10-05 DIAGNOSIS — D509 Iron deficiency anemia, unspecified: Secondary | ICD-10-CM | POA: Diagnosis not present

## 2016-10-05 DIAGNOSIS — D631 Anemia in chronic kidney disease: Secondary | ICD-10-CM | POA: Diagnosis not present

## 2016-10-07 DIAGNOSIS — D631 Anemia in chronic kidney disease: Secondary | ICD-10-CM | POA: Diagnosis not present

## 2016-10-07 DIAGNOSIS — N2581 Secondary hyperparathyroidism of renal origin: Secondary | ICD-10-CM | POA: Diagnosis not present

## 2016-10-07 DIAGNOSIS — D509 Iron deficiency anemia, unspecified: Secondary | ICD-10-CM | POA: Diagnosis not present

## 2016-10-07 DIAGNOSIS — N186 End stage renal disease: Secondary | ICD-10-CM | POA: Diagnosis not present

## 2016-10-09 DIAGNOSIS — D631 Anemia in chronic kidney disease: Secondary | ICD-10-CM | POA: Diagnosis not present

## 2016-10-09 DIAGNOSIS — N186 End stage renal disease: Secondary | ICD-10-CM | POA: Diagnosis not present

## 2016-10-09 DIAGNOSIS — D509 Iron deficiency anemia, unspecified: Secondary | ICD-10-CM | POA: Diagnosis not present

## 2016-10-09 DIAGNOSIS — N2581 Secondary hyperparathyroidism of renal origin: Secondary | ICD-10-CM | POA: Diagnosis not present

## 2016-10-12 DIAGNOSIS — D631 Anemia in chronic kidney disease: Secondary | ICD-10-CM | POA: Diagnosis not present

## 2016-10-12 DIAGNOSIS — D509 Iron deficiency anemia, unspecified: Secondary | ICD-10-CM | POA: Diagnosis not present

## 2016-10-12 DIAGNOSIS — N2581 Secondary hyperparathyroidism of renal origin: Secondary | ICD-10-CM | POA: Diagnosis not present

## 2016-10-12 DIAGNOSIS — N186 End stage renal disease: Secondary | ICD-10-CM | POA: Diagnosis not present

## 2016-10-13 ENCOUNTER — Encounter: Payer: Self-pay | Admitting: Family Medicine

## 2016-10-14 DIAGNOSIS — N2581 Secondary hyperparathyroidism of renal origin: Secondary | ICD-10-CM | POA: Diagnosis not present

## 2016-10-14 DIAGNOSIS — D631 Anemia in chronic kidney disease: Secondary | ICD-10-CM | POA: Diagnosis not present

## 2016-10-14 DIAGNOSIS — N186 End stage renal disease: Secondary | ICD-10-CM | POA: Diagnosis not present

## 2016-10-14 DIAGNOSIS — D509 Iron deficiency anemia, unspecified: Secondary | ICD-10-CM | POA: Diagnosis not present

## 2016-10-15 ENCOUNTER — Encounter: Payer: Self-pay | Admitting: Family Medicine

## 2016-10-15 ENCOUNTER — Ambulatory Visit: Payer: Medicare Other | Attending: Family Medicine | Admitting: Family Medicine

## 2016-10-15 VITALS — BP 166/86 | HR 97 | Temp 97.6°F | Ht 67.0 in | Wt 127.0 lb

## 2016-10-15 DIAGNOSIS — Z992 Dependence on renal dialysis: Secondary | ICD-10-CM | POA: Diagnosis not present

## 2016-10-15 DIAGNOSIS — N186 End stage renal disease: Secondary | ICD-10-CM | POA: Insufficient documentation

## 2016-10-15 DIAGNOSIS — L309 Dermatitis, unspecified: Secondary | ICD-10-CM | POA: Diagnosis not present

## 2016-10-15 DIAGNOSIS — R053 Chronic cough: Secondary | ICD-10-CM | POA: Insufficient documentation

## 2016-10-15 DIAGNOSIS — R05 Cough: Secondary | ICD-10-CM | POA: Insufficient documentation

## 2016-10-15 DIAGNOSIS — F17210 Nicotine dependence, cigarettes, uncomplicated: Secondary | ICD-10-CM | POA: Insufficient documentation

## 2016-10-15 DIAGNOSIS — I120 Hypertensive chronic kidney disease with stage 5 chronic kidney disease or end stage renal disease: Secondary | ICD-10-CM | POA: Diagnosis not present

## 2016-10-15 DIAGNOSIS — R0602 Shortness of breath: Secondary | ICD-10-CM | POA: Insufficient documentation

## 2016-10-15 DIAGNOSIS — K254 Chronic or unspecified gastric ulcer with hemorrhage: Secondary | ICD-10-CM

## 2016-10-15 DIAGNOSIS — Z79899 Other long term (current) drug therapy: Secondary | ICD-10-CM | POA: Insufficient documentation

## 2016-10-15 DIAGNOSIS — K922 Gastrointestinal hemorrhage, unspecified: Secondary | ICD-10-CM

## 2016-10-15 MED ORDER — ALBUTEROL SULFATE HFA 108 (90 BASE) MCG/ACT IN AERS: 2.0000 | INHALATION_SPRAY | Freq: Four times a day (QID) | RESPIRATORY_TRACT | 0 refills | Status: DC | PRN

## 2016-10-15 MED ORDER — TRIAMCINOLONE ACETONIDE 0.025 % EX OINT: 1.0000 "application " | TOPICAL_OINTMENT | Freq: Two times a day (BID) | CUTANEOUS | 0 refills | Status: DC

## 2016-10-15 MED ORDER — GUAIFENESIN ER 600 MG PO TB12: 600.0000 mg | ORAL_TABLET | Freq: Two times a day (BID) | ORAL | 5 refills | Status: DC

## 2016-10-15 MED FILL — TRIAMCINOLONE 0.025% OINT: 0.025 | 20 days supply | Qty: 30 | Fill #0

## 2016-10-15 MED FILL — PANTOPRAZOLE SOD DR 40 MG T: 40 | 30 days supply | Qty: 60 | Fill #1

## 2016-10-15 MED FILL — VENTOLIN HFA 90 MCG INHALER: 108 (90 BAS | 25 days supply | Qty: 18 | Fill #0

## 2016-10-15 NOTE — Patient Instructions (Addendum)
Karen Morrow was seen today for hospitalization follow-up.  Diagnoses and all orders for this visit:  Shortness of breath -     albuterol (PROVENTIL HFA;VENTOLIN HFA) 108 (90 Base) MCG/ACT inhaler; Inhale 2 puffs into the lungs every 6 (six) hours as needed for wheezing or shortness of breath. -     Ambulatory referral to Pulmonology -     Pulmonary Function Test; Future -     ECHOCARDIOGRAM COMPLETE; Future -     CBC -     guaiFENesin (MUCINEX) 600 MG 12 hr tablet; Take 1 tablet (600 mg total) by mouth 2 (two) times daily.  Acute GI bleeding -     CBC -     Comprehensive metabolic panel  Dermatitis -     triamcinolone (KENALOG) 0.025 % ointment; Apply 1 application topically 2 (two) times daily. AV graft rash  Chronic cough -     DG Chest 2 View; Future -     guaiFENesin (MUCINEX) 600 MG 12 hr tablet; Take 1 tablet (600 mg total) by mouth 2 (two) times daily.   Use kenalog ointment twice daily for 7-10 days   F/u in 3 to check rash on arm and f/u cough and shortness of breath   Dr. Adrian Blackwater

## 2016-10-15 NOTE — Progress Notes (Signed)
Subjective:  Patient ID: Karen Morrow, female    DOB: February 19, 1961  Age: 56 y.o. MRN: [id number]  CC: Hospitalization Follow-up   HPI Karen Morrow has HTN, CKD on dialysis [facility], current smoker she presents for   1 Hospitalization follow up: she was hospitalized from 4/9-4/04/2017 following presenting to ED with complaint of hematemesis. She has history of gastric ulcer in 2015. She underwent ED and was found to have non bleeding gastric ulcer. She was treated with protonix infusion and octreotide. She received 2 U PRBC for Hgb of 7/6. She was discharged with Hgb of 9.1. She was discharged on protonix 40 mg BID.  Today, she denies recurrent hematemesis. She admits to weakness in legs for past 6 months or so. Productive cough and shortness of breath for the past 7 months. She complained of the same last visit in 03/2016. She has had x-ray imaging but has not completed the ECHO.  DG chest 05/17/16 IMPRESSION: Stable blunting of right costal diaphragmatic angle and right basilar opacity which may represent chronic effusion or pleural thickening. No new focal consolidation. Stable cardiomegaly.   She also endorses rash along AV fistula with itching for past 3 weeks. She was prescribed medication for itching which has helped with the itch but not the rash.    Social History  Substance Use Topics  . Smoking status: Light Tobacco Smoker    Packs/day: 0.50    Years: 20.00    Types: Cigarettes  . Smokeless tobacco: Never Used  . Alcohol use 0.6 oz/week    1 Glasses of wine per week     Comment: 09/07/16-pt reports less than 1 glass of wine/week. 11/03/2012 "1-2 40oz beers/day maybe"   11/17/15 "I don;t drink beer anymore since Ive been on dialysis". p    Outpatient Medications Prior to Visit  Medication Sig Dispense Refill  . albuterol (PROVENTIL) (2.5 MG/3ML) 0.083% nebulizer solution Take 3 mLs (2.5 mg total) by nebulization every 6 (six) hours as needed for wheezing or shortness  of breath. 150 mL 3  . amLODipine (NORVASC) 10 MG tablet Take 10 mg by mouth at bedtime.  6  . ipratropium (ATROVENT) 0.06 % nasal spray Place 2 sprays into both nostrils 4 (four) times daily. 15 mL 0  . metoprolol tartrate (LOPRESSOR) 25 MG tablet Take 25 mg by mouth 2 (two) times daily.  6  . omeprazole (PRILOSEC) 20 MG capsule Take 1 capsule (20 mg total) by mouth daily. 14 capsule 0  . hydrALAZINE (APRESOLINE) 10 MG tablet Take 2 tablets (20 mg total) by mouth 3 (three) times daily. 120 tablet 3  . hydrOXYzine (VISTARIL) 25 MG capsule Take 1 capsule (25 mg total) by mouth 3 (three) times daily as needed for itching. (Patient not taking: Reported on 09/06/2016) 30 capsule 2  . mirtazapine (REMERON) 15 MG tablet Take 1 tablet (15 mg total) by mouth at bedtime. (Patient not taking: Reported on 09/06/2016) 30 tablet 2  . pantoprazole (PROTONIX) 40 MG tablet Take 1 tablet (40 mg total) by mouth 2 (two) times daily. 60 tablet 1   No facility-administered medications prior to visit.     ROS Review of Systems  Constitutional: Positive for unexpected weight change. Negative for chills and fever.  HENT: Negative for sore throat.   Eyes: Negative for visual disturbance.  Respiratory: Positive for cough and shortness of breath.   Cardiovascular: Negative for chest pain, palpitations and leg swelling.  Gastrointestinal: Negative for abdominal pain and blood in stool.  Musculoskeletal: Positive for myalgias (in legs ). Negative for arthralgias and back pain.  Skin: Negative for rash.  Allergic/Immunologic: Negative for immunocompromised state.  Neurological: Negative for dizziness.  Hematological: Negative for adenopathy. Does not bruise/bleed easily.  Psychiatric/Behavioral: Positive for sleep disturbance. Negative for dysphoric mood and suicidal ideas.    Objective:  BP (!) 166/86   Pulse 97   Temp 97.6 F (36.4 C) (Oral)   Ht 5\' 7"  (1.702 m)   Wt 127 lb (57.6 kg)   SpO2 98%   BMI 19.89  kg/m   Ambulatory pulse Ox 97-100%  BP/Weight 10/15/2016 4/40/1027 07/05/3662  Systolic BP 403 474 -  Diastolic BP 86 98 -  Wt. (Lbs) 127 131.17 -  BMI 19.89 - 20.54   Wt Readings from Last 3 Encounters:  10/15/16 127 lb (57.6 kg)  09/09/16 131 lb 2.8 oz (59.5 kg)  04/16/16 135 lb 12.8 oz (61.6 kg)    No data found.  Physical Exam  Constitutional: She is oriented to person, place, and time. She appears well-developed and well-nourished. No distress.  Very Thin frail female  HENT:  Head: Normocephalic and atraumatic.  Cardiovascular: Normal rate, regular rhythm, normal heart sounds and intact distal pulses.   Pulmonary/Chest: Effort normal and breath sounds normal.  Abdominal: Soft. Bowel sounds are normal. She exhibits no distension and no mass. There is no tenderness. There is no rebound and no guarding.  Musculoskeletal: She exhibits no edema.       Right hip: Normal.       Left hip: Normal.       Right knee: Normal.       Left knee: Normal.       Right ankle: Normal.       Left ankle: Normal.  Neurological: She is alert and oriented to person, place, and time.  Skin: Skin is warm and dry. No rash noted.     Psychiatric: She has a normal mood and affect.   Depression screen [facility] 2/9 04/16/2016 12/11/2014 10/30/2014  Decreased Interest 1 0 0  Down, Depressed, Hopeless 2 0 0  PHQ - 2 Score 3 0 0  Altered sleeping 3 - -  Tired, decreased energy 3 - -  Change in appetite 2 - -  Feeling bad or failure about yourself  0 - -  Trouble concentrating 0 - -  Moving slowly or fidgety/restless 2 - -  Suicidal thoughts 0 - -  PHQ-9 Score 13 - -   GAD 7 : Generalized Anxiety Score 04/16/2016  Nervous, Anxious, on Edge 0  Control/stop worrying 0  Worry too much - different things 1  Trouble relaxing 0  Restless 1  Easily annoyed or irritable 1  Afraid - awful might happen 0  Total GAD 7 Score 3      Assessment & Plan:  Karen Morrow was seen today for hospitalization  follow-up.  Diagnoses and all orders for this visit:  Shortness of breath -     albuterol (PROVENTIL HFA;VENTOLIN HFA) 108 (90 Base) MCG/ACT inhaler; Inhale 2 puffs into the lungs every 6 (six) hours as needed for wheezing or shortness of breath. -     Ambulatory referral to Pulmonology -     Pulmonary Function Test; Future -     ECHOCARDIOGRAM COMPLETE; Future -     CBC -     guaiFENesin (MUCINEX) 600 MG 12 hr tablet; Take 1 tablet (600 mg total) by mouth 2 (two) times daily.  Acute GI bleeding -  CBC -     Comprehensive metabolic panel  Dermatitis -     triamcinolone (KENALOG) 0.025 % ointment; Apply 1 application topically 2 (two) times daily. AV graft rash  Chronic cough -     DG Chest 2 View; Future -     guaiFENesin (MUCINEX) 600 MG 12 hr tablet; Take 1 tablet (600 mg total) by mouth 2 (two) times daily.   There are no diagnoses linked to this encounter.  No orders of the defined types were placed in this encounter.   Follow-up: Return in about 3 weeks (around 11/05/2016) for rash and shortness of breath .   Boykin Nearing MD

## 2016-10-15 NOTE — Assessment & Plan Note (Signed)
Gastric ulcer with recent GI bleed Plan: Continue protonix Check CBC

## 2016-10-15 NOTE — Assessment & Plan Note (Signed)
Rash over AV fistula not consistent with cellulitis Plan: Low to mid potency steroid cream, short course 7-10 days

## 2016-10-15 NOTE — Assessment & Plan Note (Signed)
chronic cough in smoker with HTN, end stage CKD Suspect COPD Possible CHF There was R sided effusion on last CXR  Plan: Repeat CXR ECHO pulm referral and PFTs  Smoking cessation addressed

## 2016-10-16 DIAGNOSIS — D509 Iron deficiency anemia, unspecified: Secondary | ICD-10-CM | POA: Diagnosis not present

## 2016-10-16 DIAGNOSIS — N186 End stage renal disease: Secondary | ICD-10-CM | POA: Diagnosis not present

## 2016-10-16 DIAGNOSIS — N2581 Secondary hyperparathyroidism of renal origin: Secondary | ICD-10-CM | POA: Diagnosis not present

## 2016-10-16 DIAGNOSIS — D631 Anemia in chronic kidney disease: Secondary | ICD-10-CM | POA: Diagnosis not present

## 2016-10-16 LAB — COMPREHENSIVE METABOLIC PANEL
ALT: 25 IU/L (ref 0–32)
AST: 36 IU/L (ref 0–40)
Albumin/Globulin Ratio: 1.4 (ref 1.2–2.2)
Albumin: 4.2 g/dL (ref 3.5–5.5)
Alkaline Phosphatase: 108 IU/L (ref 39–117)
BUN/Creatinine Ratio: 5 — ABNORMAL LOW (ref 9–23)
BUN: 29 mg/dL — ABNORMAL HIGH (ref 6–24)
Bilirubin Total: 0.4 mg/dL (ref 0.0–1.2)
CO2: 29 mmol/L (ref 18–29)
Calcium: 10.1 mg/dL (ref 8.7–10.2)
Chloride: 94 mmol/L — ABNORMAL LOW (ref 96–106)
Creatinine, Ser: 5.69 mg/dL — ABNORMAL HIGH (ref 0.57–1.00)
GFR calc Af Amer: 9 mL/min/{1.73_m2} — ABNORMAL LOW (ref >59)
GFR calc non Af Amer: 8 mL/min/{1.73_m2} — ABNORMAL LOW (ref >59)
Globulin, Total: 3 g/dL (ref 1.5–4.5)
Glucose: 64 mg/dL — ABNORMAL LOW (ref 65–99)
Potassium: 4.7 mmol/L (ref 3.5–5.2)
Sodium: 139 mmol/L (ref 134–144)
Total Protein: 7.2 g/dL (ref 6.0–8.5)

## 2016-10-16 LAB — CBC
Hematocrit: 32.1 % — ABNORMAL LOW (ref 34.0–46.6)
Hemoglobin: 11 g/dL — ABNORMAL LOW (ref 11.1–15.9)
MCH: 31.3 pg (ref 26.6–33.0)
MCHC: 34.3 g/dL (ref 31.5–35.7)
MCV: 92 fL (ref 79–97)
Platelets: 179 10*3/uL (ref 150–379)
RBC: 3.51 x10E6/uL — ABNORMAL LOW (ref 3.77–5.28)
RDW: 15.6 % — ABNORMAL HIGH (ref 12.3–15.4)
WBC: 5.1 10*3/uL (ref 3.4–10.8)

## 2016-10-19 ENCOUNTER — Telehealth: Payer: Self-pay

## 2016-10-19 DIAGNOSIS — D631 Anemia in chronic kidney disease: Secondary | ICD-10-CM | POA: Diagnosis not present

## 2016-10-19 DIAGNOSIS — N186 End stage renal disease: Secondary | ICD-10-CM | POA: Diagnosis not present

## 2016-10-19 DIAGNOSIS — N2581 Secondary hyperparathyroidism of renal origin: Secondary | ICD-10-CM | POA: Diagnosis not present

## 2016-10-19 DIAGNOSIS — D509 Iron deficiency anemia, unspecified: Secondary | ICD-10-CM | POA: Diagnosis not present

## 2016-10-19 NOTE — Telephone Encounter (Signed)
Pt was called and informed of lab results. 

## 2016-10-21 ENCOUNTER — Telehealth: Payer: Self-pay | Admitting: Family Medicine

## 2016-10-21 DIAGNOSIS — D509 Iron deficiency anemia, unspecified: Secondary | ICD-10-CM | POA: Diagnosis not present

## 2016-10-21 DIAGNOSIS — D631 Anemia in chronic kidney disease: Secondary | ICD-10-CM | POA: Diagnosis not present

## 2016-10-21 DIAGNOSIS — N186 End stage renal disease: Secondary | ICD-10-CM | POA: Diagnosis not present

## 2016-10-21 DIAGNOSIS — N2581 Secondary hyperparathyroidism of renal origin: Secondary | ICD-10-CM | POA: Diagnosis not present

## 2016-10-21 NOTE — Telephone Encounter (Signed)
PT was informed of the FMLA paper that will be on the front office

## 2016-10-21 NOTE — Telephone Encounter (Signed)
Please call patient FMLA paperwork should be completed by her husband's MD who will know details regarding treatment/ needed time off She can pick up paperwork

## 2016-10-21 NOTE — Telephone Encounter (Signed)
Pt was called and a VM was left informing pt to return phone call. 

## 2016-10-23 DIAGNOSIS — D509 Iron deficiency anemia, unspecified: Secondary | ICD-10-CM | POA: Diagnosis not present

## 2016-10-23 DIAGNOSIS — D631 Anemia in chronic kidney disease: Secondary | ICD-10-CM | POA: Diagnosis not present

## 2016-10-23 DIAGNOSIS — N186 End stage renal disease: Secondary | ICD-10-CM | POA: Diagnosis not present

## 2016-10-23 DIAGNOSIS — N2581 Secondary hyperparathyroidism of renal origin: Secondary | ICD-10-CM | POA: Diagnosis not present

## 2016-10-26 ENCOUNTER — Ambulatory Visit (HOSPITAL_COMMUNITY): Admission: RE | Admit: 2016-10-26 | Payer: Medicare Other | Source: Ambulatory Visit

## 2016-10-26 DIAGNOSIS — N2581 Secondary hyperparathyroidism of renal origin: Secondary | ICD-10-CM | POA: Diagnosis not present

## 2016-10-26 DIAGNOSIS — N186 End stage renal disease: Secondary | ICD-10-CM | POA: Diagnosis not present

## 2016-10-26 DIAGNOSIS — D631 Anemia in chronic kidney disease: Secondary | ICD-10-CM | POA: Diagnosis not present

## 2016-10-26 DIAGNOSIS — D509 Iron deficiency anemia, unspecified: Secondary | ICD-10-CM | POA: Diagnosis not present

## 2016-10-28 DIAGNOSIS — D509 Iron deficiency anemia, unspecified: Secondary | ICD-10-CM | POA: Diagnosis not present

## 2016-10-28 DIAGNOSIS — N186 End stage renal disease: Secondary | ICD-10-CM | POA: Diagnosis not present

## 2016-10-28 DIAGNOSIS — D631 Anemia in chronic kidney disease: Secondary | ICD-10-CM | POA: Diagnosis not present

## 2016-10-28 DIAGNOSIS — Z992 Dependence on renal dialysis: Secondary | ICD-10-CM | POA: Diagnosis not present

## 2016-10-28 DIAGNOSIS — N2581 Secondary hyperparathyroidism of renal origin: Secondary | ICD-10-CM | POA: Diagnosis not present

## 2016-10-28 DIAGNOSIS — I120 Hypertensive chronic kidney disease with stage 5 chronic kidney disease or end stage renal disease: Secondary | ICD-10-CM | POA: Diagnosis not present

## 2016-10-30 DIAGNOSIS — D509 Iron deficiency anemia, unspecified: Secondary | ICD-10-CM | POA: Diagnosis not present

## 2016-10-30 DIAGNOSIS — N186 End stage renal disease: Secondary | ICD-10-CM | POA: Diagnosis not present

## 2016-10-30 DIAGNOSIS — N2581 Secondary hyperparathyroidism of renal origin: Secondary | ICD-10-CM | POA: Diagnosis not present

## 2016-10-30 DIAGNOSIS — D631 Anemia in chronic kidney disease: Secondary | ICD-10-CM | POA: Diagnosis not present

## 2016-11-02 DIAGNOSIS — D631 Anemia in chronic kidney disease: Secondary | ICD-10-CM | POA: Diagnosis not present

## 2016-11-02 DIAGNOSIS — N2581 Secondary hyperparathyroidism of renal origin: Secondary | ICD-10-CM | POA: Diagnosis not present

## 2016-11-02 DIAGNOSIS — D509 Iron deficiency anemia, unspecified: Secondary | ICD-10-CM | POA: Diagnosis not present

## 2016-11-02 DIAGNOSIS — N186 End stage renal disease: Secondary | ICD-10-CM | POA: Diagnosis not present

## 2016-11-04 DIAGNOSIS — D509 Iron deficiency anemia, unspecified: Secondary | ICD-10-CM | POA: Diagnosis not present

## 2016-11-04 DIAGNOSIS — N186 End stage renal disease: Secondary | ICD-10-CM | POA: Diagnosis not present

## 2016-11-04 DIAGNOSIS — N2581 Secondary hyperparathyroidism of renal origin: Secondary | ICD-10-CM | POA: Diagnosis not present

## 2016-11-04 DIAGNOSIS — D631 Anemia in chronic kidney disease: Secondary | ICD-10-CM | POA: Diagnosis not present

## 2016-11-06 DIAGNOSIS — D631 Anemia in chronic kidney disease: Secondary | ICD-10-CM | POA: Diagnosis not present

## 2016-11-06 DIAGNOSIS — N2581 Secondary hyperparathyroidism of renal origin: Secondary | ICD-10-CM | POA: Diagnosis not present

## 2016-11-06 DIAGNOSIS — D509 Iron deficiency anemia, unspecified: Secondary | ICD-10-CM | POA: Diagnosis not present

## 2016-11-06 DIAGNOSIS — N186 End stage renal disease: Secondary | ICD-10-CM | POA: Diagnosis not present

## 2016-11-08 ENCOUNTER — Encounter: Payer: Self-pay | Admitting: Family Medicine

## 2016-11-08 ENCOUNTER — Ambulatory Visit (HOSPITAL_COMMUNITY)
Admission: RE | Admit: 2016-11-08 | Discharge: 2016-11-08 | Disposition: A | Discharge status: Home / Self Care | Payer: Medicare Other | Source: Ambulatory Visit | Attending: Family Medicine | Admitting: Family Medicine

## 2016-11-08 DIAGNOSIS — I119 Hypertensive heart disease without heart failure: Secondary | ICD-10-CM | POA: Insufficient documentation

## 2016-11-08 DIAGNOSIS — I2722 Pulmonary hypertension due to left heart disease: Secondary | ICD-10-CM | POA: Insufficient documentation

## 2016-11-08 DIAGNOSIS — I083 Combined rheumatic disorders of mitral, aortic and tricuspid valves: Secondary | ICD-10-CM | POA: Insufficient documentation

## 2016-11-08 DIAGNOSIS — R0602 Shortness of breath: Secondary | ICD-10-CM | POA: Diagnosis not present

## 2016-11-08 DIAGNOSIS — I272 Pulmonary hypertension, unspecified: Secondary | ICD-10-CM | POA: Diagnosis not present

## 2016-11-08 NOTE — Progress Notes (Signed)
  Echocardiogram 2D Echocardiogram has been performed.  Amorita Vanrossum 11/08/2016, 4:13 PM

## 2016-11-08 NOTE — Assessment & Plan Note (Signed)
ECHO reveals grade 2 left ventricular  diastolic dysfunction and severe pulmonary HTN, normal RV function.  She has not fluid retention as she receives regular hemodialysis.   Patient has been referred to pulmonology She primarily needs BP reduction

## 2016-11-08 NOTE — Progress Notes (Signed)
ECHO reveals grade 2 left ventricular  diastolic dysfunction and severe pulmonary HTN, normal RV function.  She has not fluid retention as she receives regular hemodialysis.   Patient has been referred to pulmonology She primarily needs BP reduction

## 2016-11-09 DIAGNOSIS — D509 Iron deficiency anemia, unspecified: Secondary | ICD-10-CM | POA: Diagnosis not present

## 2016-11-09 DIAGNOSIS — N186 End stage renal disease: Secondary | ICD-10-CM | POA: Diagnosis not present

## 2016-11-09 DIAGNOSIS — N2581 Secondary hyperparathyroidism of renal origin: Secondary | ICD-10-CM | POA: Diagnosis not present

## 2016-11-09 DIAGNOSIS — D631 Anemia in chronic kidney disease: Secondary | ICD-10-CM | POA: Diagnosis not present

## 2016-11-10 ENCOUNTER — Telehealth: Payer: Self-pay

## 2016-11-10 ENCOUNTER — Other Ambulatory Visit: Payer: Self-pay

## 2016-11-10 DIAGNOSIS — K259 Gastric ulcer, unspecified as acute or chronic, without hemorrhage or perforation: Secondary | ICD-10-CM

## 2016-11-10 NOTE — Telephone Encounter (Signed)
EGD scheduled, pt instructed and medications reviewed.  Patient instructions mailed to home.  Patient to call with any questions or concerns.  

## 2016-11-10 NOTE — Telephone Encounter (Signed)
-----   Message from Jeoffrey Massed, RN sent at 09/10/2016  1:11 PM EDT ----- repeat EGD with me in 2 months ([facility] case, poor access, HD patient) to confirm healing

## 2016-11-10 NOTE — Telephone Encounter (Signed)
The pt has been scheduled for 12/09/16 EGD [facility] 1030 am  Left message on machine to call back

## 2016-11-11 DIAGNOSIS — D509 Iron deficiency anemia, unspecified: Secondary | ICD-10-CM | POA: Diagnosis not present

## 2016-11-11 DIAGNOSIS — D631 Anemia in chronic kidney disease: Secondary | ICD-10-CM | POA: Diagnosis not present

## 2016-11-11 DIAGNOSIS — N2581 Secondary hyperparathyroidism of renal origin: Secondary | ICD-10-CM | POA: Diagnosis not present

## 2016-11-11 DIAGNOSIS — N186 End stage renal disease: Secondary | ICD-10-CM | POA: Diagnosis not present

## 2016-11-13 DIAGNOSIS — D509 Iron deficiency anemia, unspecified: Secondary | ICD-10-CM | POA: Diagnosis not present

## 2016-11-13 DIAGNOSIS — D631 Anemia in chronic kidney disease: Secondary | ICD-10-CM | POA: Diagnosis not present

## 2016-11-13 DIAGNOSIS — N2581 Secondary hyperparathyroidism of renal origin: Secondary | ICD-10-CM | POA: Diagnosis not present

## 2016-11-13 DIAGNOSIS — N186 End stage renal disease: Secondary | ICD-10-CM | POA: Diagnosis not present

## 2016-11-16 DIAGNOSIS — D509 Iron deficiency anemia, unspecified: Secondary | ICD-10-CM | POA: Diagnosis not present

## 2016-11-16 DIAGNOSIS — N186 End stage renal disease: Secondary | ICD-10-CM | POA: Diagnosis not present

## 2016-11-16 DIAGNOSIS — D631 Anemia in chronic kidney disease: Secondary | ICD-10-CM | POA: Diagnosis not present

## 2016-11-16 DIAGNOSIS — N2581 Secondary hyperparathyroidism of renal origin: Secondary | ICD-10-CM | POA: Diagnosis not present

## 2016-11-18 ENCOUNTER — Telehealth: Payer: Self-pay

## 2016-11-18 DIAGNOSIS — D631 Anemia in chronic kidney disease: Secondary | ICD-10-CM | POA: Diagnosis not present

## 2016-11-18 DIAGNOSIS — D509 Iron deficiency anemia, unspecified: Secondary | ICD-10-CM | POA: Diagnosis not present

## 2016-11-18 DIAGNOSIS — N2581 Secondary hyperparathyroidism of renal origin: Secondary | ICD-10-CM | POA: Diagnosis not present

## 2016-11-18 DIAGNOSIS — N186 End stage renal disease: Secondary | ICD-10-CM | POA: Diagnosis not present

## 2016-11-18 NOTE — Telephone Encounter (Signed)
Pt was called and informed of lab results and referral being placed. 

## 2016-11-20 DIAGNOSIS — N2581 Secondary hyperparathyroidism of renal origin: Secondary | ICD-10-CM | POA: Diagnosis not present

## 2016-11-20 DIAGNOSIS — N186 End stage renal disease: Secondary | ICD-10-CM | POA: Diagnosis not present

## 2016-11-20 DIAGNOSIS — D509 Iron deficiency anemia, unspecified: Secondary | ICD-10-CM | POA: Diagnosis not present

## 2016-11-20 DIAGNOSIS — D631 Anemia in chronic kidney disease: Secondary | ICD-10-CM | POA: Diagnosis not present

## 2016-11-23 DIAGNOSIS — N2581 Secondary hyperparathyroidism of renal origin: Secondary | ICD-10-CM | POA: Diagnosis not present

## 2016-11-23 DIAGNOSIS — D631 Anemia in chronic kidney disease: Secondary | ICD-10-CM | POA: Diagnosis not present

## 2016-11-23 DIAGNOSIS — D509 Iron deficiency anemia, unspecified: Secondary | ICD-10-CM | POA: Diagnosis not present

## 2016-11-23 DIAGNOSIS — N186 End stage renal disease: Secondary | ICD-10-CM | POA: Diagnosis not present

## 2016-11-23 MED FILL — PANTOPRAZOLE SOD DR 20 MG T: 20 | 30 days supply | Qty: 60 | Fill #0

## 2016-11-25 DIAGNOSIS — D509 Iron deficiency anemia, unspecified: Secondary | ICD-10-CM | POA: Diagnosis not present

## 2016-11-25 DIAGNOSIS — N2581 Secondary hyperparathyroidism of renal origin: Secondary | ICD-10-CM | POA: Diagnosis not present

## 2016-11-25 DIAGNOSIS — N186 End stage renal disease: Secondary | ICD-10-CM | POA: Diagnosis not present

## 2016-11-25 DIAGNOSIS — D631 Anemia in chronic kidney disease: Secondary | ICD-10-CM | POA: Diagnosis not present

## 2016-11-27 DIAGNOSIS — Z992 Dependence on renal dialysis: Secondary | ICD-10-CM | POA: Diagnosis not present

## 2016-11-27 DIAGNOSIS — D631 Anemia in chronic kidney disease: Secondary | ICD-10-CM | POA: Diagnosis not present

## 2016-11-27 DIAGNOSIS — N186 End stage renal disease: Secondary | ICD-10-CM | POA: Diagnosis not present

## 2016-11-27 DIAGNOSIS — N2581 Secondary hyperparathyroidism of renal origin: Secondary | ICD-10-CM | POA: Diagnosis not present

## 2016-11-27 DIAGNOSIS — I120 Hypertensive chronic kidney disease with stage 5 chronic kidney disease or end stage renal disease: Secondary | ICD-10-CM | POA: Diagnosis not present

## 2016-11-27 DIAGNOSIS — D509 Iron deficiency anemia, unspecified: Secondary | ICD-10-CM | POA: Diagnosis not present

## 2016-11-30 DIAGNOSIS — D631 Anemia in chronic kidney disease: Secondary | ICD-10-CM | POA: Diagnosis not present

## 2016-11-30 DIAGNOSIS — N186 End stage renal disease: Secondary | ICD-10-CM | POA: Diagnosis not present

## 2016-11-30 DIAGNOSIS — K7460 Unspecified cirrhosis of liver: Secondary | ICD-10-CM | POA: Diagnosis not present

## 2016-11-30 DIAGNOSIS — E875 Hyperkalemia: Secondary | ICD-10-CM | POA: Diagnosis not present

## 2016-11-30 DIAGNOSIS — N2581 Secondary hyperparathyroidism of renal origin: Secondary | ICD-10-CM | POA: Diagnosis not present

## 2016-12-02 DIAGNOSIS — N2581 Secondary hyperparathyroidism of renal origin: Secondary | ICD-10-CM | POA: Diagnosis not present

## 2016-12-02 DIAGNOSIS — N186 End stage renal disease: Secondary | ICD-10-CM | POA: Diagnosis not present

## 2016-12-02 DIAGNOSIS — E875 Hyperkalemia: Secondary | ICD-10-CM | POA: Diagnosis not present

## 2016-12-02 DIAGNOSIS — D631 Anemia in chronic kidney disease: Secondary | ICD-10-CM | POA: Diagnosis not present

## 2016-12-02 DIAGNOSIS — K7460 Unspecified cirrhosis of liver: Secondary | ICD-10-CM | POA: Diagnosis not present

## 2016-12-04 DIAGNOSIS — N186 End stage renal disease: Secondary | ICD-10-CM | POA: Diagnosis not present

## 2016-12-04 DIAGNOSIS — D631 Anemia in chronic kidney disease: Secondary | ICD-10-CM | POA: Diagnosis not present

## 2016-12-04 DIAGNOSIS — N2581 Secondary hyperparathyroidism of renal origin: Secondary | ICD-10-CM | POA: Diagnosis not present

## 2016-12-04 DIAGNOSIS — E875 Hyperkalemia: Secondary | ICD-10-CM | POA: Diagnosis not present

## 2016-12-04 DIAGNOSIS — K7460 Unspecified cirrhosis of liver: Secondary | ICD-10-CM | POA: Diagnosis not present

## 2016-12-07 DIAGNOSIS — N2581 Secondary hyperparathyroidism of renal origin: Secondary | ICD-10-CM | POA: Diagnosis not present

## 2016-12-07 DIAGNOSIS — E875 Hyperkalemia: Secondary | ICD-10-CM | POA: Diagnosis not present

## 2016-12-07 DIAGNOSIS — K7460 Unspecified cirrhosis of liver: Secondary | ICD-10-CM | POA: Diagnosis not present

## 2016-12-07 DIAGNOSIS — N186 End stage renal disease: Secondary | ICD-10-CM | POA: Diagnosis not present

## 2016-12-07 DIAGNOSIS — D631 Anemia in chronic kidney disease: Secondary | ICD-10-CM | POA: Diagnosis not present

## 2016-12-08 ENCOUNTER — Encounter (HOSPITAL_COMMUNITY): Payer: Self-pay | Admitting: *Deleted

## 2016-12-08 ENCOUNTER — Telehealth: Payer: Self-pay

## 2016-12-08 NOTE — Telephone Encounter (Signed)
Per Anesthesia pt must be cancelled for tomorrow 12/09/16 d/t pulmonary hypertension.  She has an appt for 7/16 to see pulmonary.  Endo cancelled the appt and the pt was called and a message left.  Sent to Dr Ardis Hughs for review.

## 2016-12-08 NOTE — Progress Notes (Signed)
Consulted Dr.Ryan  Ellender , Anesthesia about patient's medical history and Echo results from 11/08/2016 and informed him that patient does have an appointment with Pulmonary , Dr. Melvyn Novas on 12/13/2016. Per Dr. Roanna Banning, he wants patient to have procedure after she sees Dr. Melvyn Novas and reviews his findings based on her co-morbidities and for optimization of care. LM on VM for Patty at Dr. Ardis Hughs office to call me back.

## 2016-12-08 NOTE — Telephone Encounter (Signed)
OK, can you contact her for ROV with me in late July, early august.  Add to my office wait list if no openings by then  Thanks

## 2016-12-08 NOTE — Progress Notes (Signed)
Called Patty again and LM on VM to call me about patient and Anesthesia wanting to cancel her procedure tomorrow until after she sees Dr. Melvyn Novas on 12/13/2016.

## 2016-12-09 ENCOUNTER — Encounter (HOSPITAL_COMMUNITY)
Admission: RE | Disposition: A | Discharge status: Home / Self Care | Payer: Self-pay | Source: Ambulatory Visit | Attending: Gastroenterology

## 2016-12-09 ENCOUNTER — Ambulatory Visit (HOSPITAL_COMMUNITY)
Admission: RE | Admit: 2016-12-09 | Discharge: 2016-12-09 | Disposition: A | Discharge status: Home / Self Care | Payer: Medicare Other | Source: Ambulatory Visit | Attending: Gastroenterology | Admitting: Gastroenterology

## 2016-12-09 SURGERY — ESOPHAGOGASTRODUODENOSCOPY (EGD) WITH PROPOFOL
Anesthesia: Monitor Anesthesia Care

## 2016-12-09 NOTE — Telephone Encounter (Signed)
The pt has been contacted and advised of the appt for 01/28/17 to discuss EGD.  Has been added to the wait list

## 2016-12-11 DIAGNOSIS — N2581 Secondary hyperparathyroidism of renal origin: Secondary | ICD-10-CM | POA: Diagnosis not present

## 2016-12-11 DIAGNOSIS — K7460 Unspecified cirrhosis of liver: Secondary | ICD-10-CM | POA: Diagnosis not present

## 2016-12-11 DIAGNOSIS — D631 Anemia in chronic kidney disease: Secondary | ICD-10-CM | POA: Diagnosis not present

## 2016-12-11 DIAGNOSIS — E875 Hyperkalemia: Secondary | ICD-10-CM | POA: Diagnosis not present

## 2016-12-11 DIAGNOSIS — N186 End stage renal disease: Secondary | ICD-10-CM | POA: Diagnosis not present

## 2016-12-13 ENCOUNTER — Institutional Professional Consult (permissible substitution): Payer: Medicare Other | Admitting: Internal Medicine

## 2016-12-14 DIAGNOSIS — K7460 Unspecified cirrhosis of liver: Secondary | ICD-10-CM | POA: Diagnosis not present

## 2016-12-14 DIAGNOSIS — N186 End stage renal disease: Secondary | ICD-10-CM | POA: Diagnosis not present

## 2016-12-14 DIAGNOSIS — E875 Hyperkalemia: Secondary | ICD-10-CM | POA: Diagnosis not present

## 2016-12-14 DIAGNOSIS — D631 Anemia in chronic kidney disease: Secondary | ICD-10-CM | POA: Diagnosis not present

## 2016-12-14 DIAGNOSIS — N2581 Secondary hyperparathyroidism of renal origin: Secondary | ICD-10-CM | POA: Diagnosis not present

## 2016-12-16 DIAGNOSIS — K7460 Unspecified cirrhosis of liver: Secondary | ICD-10-CM | POA: Diagnosis not present

## 2016-12-16 DIAGNOSIS — D631 Anemia in chronic kidney disease: Secondary | ICD-10-CM | POA: Diagnosis not present

## 2016-12-16 DIAGNOSIS — E875 Hyperkalemia: Secondary | ICD-10-CM | POA: Diagnosis not present

## 2016-12-16 DIAGNOSIS — N2581 Secondary hyperparathyroidism of renal origin: Secondary | ICD-10-CM | POA: Diagnosis not present

## 2016-12-16 DIAGNOSIS — N186 End stage renal disease: Secondary | ICD-10-CM | POA: Diagnosis not present

## 2016-12-18 DIAGNOSIS — N2581 Secondary hyperparathyroidism of renal origin: Secondary | ICD-10-CM | POA: Diagnosis not present

## 2016-12-18 DIAGNOSIS — K7460 Unspecified cirrhosis of liver: Secondary | ICD-10-CM | POA: Diagnosis not present

## 2016-12-18 DIAGNOSIS — D631 Anemia in chronic kidney disease: Secondary | ICD-10-CM | POA: Diagnosis not present

## 2016-12-18 DIAGNOSIS — E875 Hyperkalemia: Secondary | ICD-10-CM | POA: Diagnosis not present

## 2016-12-18 DIAGNOSIS — N186 End stage renal disease: Secondary | ICD-10-CM | POA: Diagnosis not present

## 2016-12-21 DIAGNOSIS — N186 End stage renal disease: Secondary | ICD-10-CM | POA: Diagnosis not present

## 2016-12-21 DIAGNOSIS — E875 Hyperkalemia: Secondary | ICD-10-CM | POA: Diagnosis not present

## 2016-12-21 DIAGNOSIS — K7460 Unspecified cirrhosis of liver: Secondary | ICD-10-CM | POA: Diagnosis not present

## 2016-12-21 DIAGNOSIS — D631 Anemia in chronic kidney disease: Secondary | ICD-10-CM | POA: Diagnosis not present

## 2016-12-21 DIAGNOSIS — N2581 Secondary hyperparathyroidism of renal origin: Secondary | ICD-10-CM | POA: Diagnosis not present

## 2016-12-23 DIAGNOSIS — D631 Anemia in chronic kidney disease: Secondary | ICD-10-CM | POA: Diagnosis not present

## 2016-12-23 DIAGNOSIS — N186 End stage renal disease: Secondary | ICD-10-CM | POA: Diagnosis not present

## 2016-12-23 DIAGNOSIS — K7460 Unspecified cirrhosis of liver: Secondary | ICD-10-CM | POA: Diagnosis not present

## 2016-12-23 DIAGNOSIS — N2581 Secondary hyperparathyroidism of renal origin: Secondary | ICD-10-CM | POA: Diagnosis not present

## 2016-12-23 DIAGNOSIS — E875 Hyperkalemia: Secondary | ICD-10-CM | POA: Diagnosis not present

## 2016-12-25 DIAGNOSIS — N186 End stage renal disease: Secondary | ICD-10-CM | POA: Diagnosis not present

## 2016-12-25 DIAGNOSIS — N2581 Secondary hyperparathyroidism of renal origin: Secondary | ICD-10-CM | POA: Diagnosis not present

## 2016-12-25 DIAGNOSIS — K7460 Unspecified cirrhosis of liver: Secondary | ICD-10-CM | POA: Diagnosis not present

## 2016-12-25 DIAGNOSIS — D631 Anemia in chronic kidney disease: Secondary | ICD-10-CM | POA: Diagnosis not present

## 2016-12-25 DIAGNOSIS — E875 Hyperkalemia: Secondary | ICD-10-CM | POA: Diagnosis not present

## 2016-12-28 DIAGNOSIS — D631 Anemia in chronic kidney disease: Secondary | ICD-10-CM | POA: Diagnosis not present

## 2016-12-28 DIAGNOSIS — K7460 Unspecified cirrhosis of liver: Secondary | ICD-10-CM | POA: Diagnosis not present

## 2016-12-28 DIAGNOSIS — Z992 Dependence on renal dialysis: Secondary | ICD-10-CM | POA: Diagnosis not present

## 2016-12-28 DIAGNOSIS — E875 Hyperkalemia: Secondary | ICD-10-CM | POA: Diagnosis not present

## 2016-12-28 DIAGNOSIS — I120 Hypertensive chronic kidney disease with stage 5 chronic kidney disease or end stage renal disease: Secondary | ICD-10-CM | POA: Diagnosis not present

## 2016-12-28 DIAGNOSIS — N2581 Secondary hyperparathyroidism of renal origin: Secondary | ICD-10-CM | POA: Diagnosis not present

## 2016-12-28 DIAGNOSIS — N186 End stage renal disease: Secondary | ICD-10-CM | POA: Diagnosis not present

## 2016-12-30 DIAGNOSIS — N2581 Secondary hyperparathyroidism of renal origin: Secondary | ICD-10-CM | POA: Diagnosis not present

## 2016-12-30 DIAGNOSIS — D631 Anemia in chronic kidney disease: Secondary | ICD-10-CM | POA: Diagnosis not present

## 2016-12-30 DIAGNOSIS — N186 End stage renal disease: Secondary | ICD-10-CM | POA: Diagnosis not present

## 2017-01-01 DIAGNOSIS — N186 End stage renal disease: Secondary | ICD-10-CM | POA: Diagnosis not present

## 2017-01-01 DIAGNOSIS — D631 Anemia in chronic kidney disease: Secondary | ICD-10-CM | POA: Diagnosis not present

## 2017-01-01 DIAGNOSIS — N2581 Secondary hyperparathyroidism of renal origin: Secondary | ICD-10-CM | POA: Diagnosis not present

## 2017-01-03 MED FILL — PANTOPRAZOLE SOD DR 20 MG T: 20 | 30 days supply | Qty: 60 | Fill #1

## 2017-01-04 DIAGNOSIS — N2581 Secondary hyperparathyroidism of renal origin: Secondary | ICD-10-CM | POA: Diagnosis not present

## 2017-01-04 DIAGNOSIS — N186 End stage renal disease: Secondary | ICD-10-CM | POA: Diagnosis not present

## 2017-01-04 DIAGNOSIS — D631 Anemia in chronic kidney disease: Secondary | ICD-10-CM | POA: Diagnosis not present

## 2017-01-06 DIAGNOSIS — D631 Anemia in chronic kidney disease: Secondary | ICD-10-CM | POA: Diagnosis not present

## 2017-01-06 DIAGNOSIS — N2581 Secondary hyperparathyroidism of renal origin: Secondary | ICD-10-CM | POA: Diagnosis not present

## 2017-01-06 DIAGNOSIS — N186 End stage renal disease: Secondary | ICD-10-CM | POA: Diagnosis not present

## 2017-01-08 DIAGNOSIS — N2581 Secondary hyperparathyroidism of renal origin: Secondary | ICD-10-CM | POA: Diagnosis not present

## 2017-01-08 DIAGNOSIS — N186 End stage renal disease: Secondary | ICD-10-CM | POA: Diagnosis not present

## 2017-01-08 DIAGNOSIS — D631 Anemia in chronic kidney disease: Secondary | ICD-10-CM | POA: Diagnosis not present

## 2017-01-11 ENCOUNTER — Encounter: Payer: Self-pay | Admitting: Vascular Surgery

## 2017-01-11 DIAGNOSIS — N2581 Secondary hyperparathyroidism of renal origin: Secondary | ICD-10-CM | POA: Diagnosis not present

## 2017-01-11 DIAGNOSIS — D631 Anemia in chronic kidney disease: Secondary | ICD-10-CM | POA: Diagnosis not present

## 2017-01-11 DIAGNOSIS — N186 End stage renal disease: Secondary | ICD-10-CM | POA: Diagnosis not present

## 2017-01-13 DIAGNOSIS — N186 End stage renal disease: Secondary | ICD-10-CM | POA: Diagnosis not present

## 2017-01-13 DIAGNOSIS — D631 Anemia in chronic kidney disease: Secondary | ICD-10-CM | POA: Diagnosis not present

## 2017-01-13 DIAGNOSIS — N2581 Secondary hyperparathyroidism of renal origin: Secondary | ICD-10-CM | POA: Diagnosis not present

## 2017-01-15 DIAGNOSIS — N2581 Secondary hyperparathyroidism of renal origin: Secondary | ICD-10-CM | POA: Diagnosis not present

## 2017-01-15 DIAGNOSIS — D631 Anemia in chronic kidney disease: Secondary | ICD-10-CM | POA: Diagnosis not present

## 2017-01-15 DIAGNOSIS — N186 End stage renal disease: Secondary | ICD-10-CM | POA: Diagnosis not present

## 2017-01-18 DIAGNOSIS — D631 Anemia in chronic kidney disease: Secondary | ICD-10-CM | POA: Diagnosis not present

## 2017-01-18 DIAGNOSIS — N2581 Secondary hyperparathyroidism of renal origin: Secondary | ICD-10-CM | POA: Diagnosis not present

## 2017-01-18 DIAGNOSIS — N186 End stage renal disease: Secondary | ICD-10-CM | POA: Diagnosis not present

## 2017-01-20 DIAGNOSIS — N186 End stage renal disease: Secondary | ICD-10-CM | POA: Diagnosis not present

## 2017-01-20 DIAGNOSIS — N2581 Secondary hyperparathyroidism of renal origin: Secondary | ICD-10-CM | POA: Diagnosis not present

## 2017-01-20 DIAGNOSIS — D631 Anemia in chronic kidney disease: Secondary | ICD-10-CM | POA: Diagnosis not present

## 2017-01-21 ENCOUNTER — Encounter: Payer: Self-pay | Admitting: Vascular Surgery

## 2017-01-21 ENCOUNTER — Institutional Professional Consult (permissible substitution): Payer: Medicare Other | Admitting: Internal Medicine

## 2017-01-21 ENCOUNTER — Ambulatory Visit (INDEPENDENT_AMBULATORY_CARE_PROVIDER_SITE_OTHER): Payer: Medicare Other | Admitting: Vascular Surgery

## 2017-01-21 VITALS — BP 140/87 | HR 74 | Temp 97.7°F | Ht 67.0 in | Wt 128.6 lb

## 2017-01-21 DIAGNOSIS — T82898A Other specified complication of vascular prosthetic devices, implants and grafts, initial encounter: Secondary | ICD-10-CM | POA: Diagnosis not present

## 2017-01-21 DIAGNOSIS — T82510A Breakdown (mechanical) of surgically created arteriovenous fistula, initial encounter: Secondary | ICD-10-CM

## 2017-01-21 NOTE — Progress Notes (Signed)
Requested by:  Boykin Nearing, MD [place], [place] [id number]  Reason for consultation: New access   History of Present Illness   Karen Morrow is a 56 y.o. ([birth year]) female who presents for evaluation aneurysms in R BVT.  This patient had the R BVT placed 03/06/14 by Dr. Donnetta Hutching.  Since then the patient has been using the R BVT without any steal sx.  She has developed some bleeding issues at the most aneurysmal segments of the fistula.  She was sent her as her nephrologist is worried the patient is at risk for rupture of the fistula.  Past Medical History:  Diagnosis Date  . Anginal pain ([place])   . Asthma   . Chronic kidney disease    dialysis 3x wk  . Dyspnea   . GERD (gastroesophageal reflux disease)   . Hepatitis C antibody test positive   . Hypertension    "just dx'd today" (11/03/2012)    Past Surgical History:  Procedure Laterality Date  . ABDOMINAL HYSTERECTOMY     "partial" (11/03/2012)  . AV FISTULA PLACEMENT Right 12/03/2013   Procedure: CREATION OF ARTERIOVENOUS (AV) FISTULA RIGHT ARM ;  Surgeon: Rosetta Posner, MD;  Location: [facility];  Service: Vascular;  Laterality: Right;  . BASCILIC VEIN TRANSPOSITION Right 03/06/2014   Procedure: SECOND STAGE BASILIC VEIN TRANSPOSITION;  Surgeon: Rosetta Posner, MD;  Location: [facility] OR;  Service: Vascular;  Laterality: Right;  . ESOPHAGOGASTRODUODENOSCOPY N/A 06/05/2013   Procedure: ESOPHAGOGASTRODUODENOSCOPY (EGD);  Surgeon: Winfield Cunas., MD;  Location: [facility];  Service: Endoscopy;  Laterality: N/A;  . ESOPHAGOGASTRODUODENOSCOPY N/A 09/08/2016   Procedure: ESOPHAGOGASTRODUODENOSCOPY (EGD);  Surgeon: Milus Banister, MD;  Location: [place];  Service: Endoscopy;  Laterality: N/A;    Social History   Social History  . Marital status: Married    Spouse name: N/A  . Number of children: N/A  . Years of education: N/A   Occupational History  . Not on file.   Social History Main Topics  . Smoking  status: Light Tobacco Smoker    Packs/day: 0.50    Years: 20.00    Types: Cigarettes  . Smokeless tobacco: Never Used  . Alcohol use 0.6 oz/week    1 Glasses of wine per week     Comment: 09/07/16-pt reports less than 1 glass of wine/week. 11/03/2012 "1-2 40oz beers/day maybe"   11/17/15 "I don;t drink beer anymore since Ive been on dialysis". p  . Drug use: Yes    Types: Marijuana     Comment: 11/03/2012 "maybe once or twice/wk" 11/17/15 "I haven't smoked in 3-4 months"  . Sexual activity: Not Currently   Other Topics Concern  . Not on file   Social History Narrative  . No narrative on file    Family History  Problem Relation Age of Onset  . Lupus Mother   . Diabetes Father   . Heart attack Father     Current Outpatient Prescriptions  Medication Sig Dispense Refill  . albuterol (PROVENTIL HFA;VENTOLIN HFA) 108 (90 Base) MCG/ACT inhaler Inhale 2 puffs into the lungs every 6 (six) hours as needed for wheezing or shortness of breath. 1 Inhaler 0  . albuterol (PROVENTIL) (2.5 MG/3ML) 0.083% nebulizer solution Take 3 mLs (2.5 mg total) by nebulization every 6 (six) hours as needed for wheezing or shortness of breath. 150 mL 3  . amLODipine (NORVASC) 10 MG tablet Take 10 mg by mouth daily after breakfast.   6  .  guaiFENesin (MUCINEX) 600 MG 12 hr tablet Take 1 tablet (600 mg total) by mouth 2 (two) times daily. 60 tablet 5  . hydrOXYzine (VISTARIL) 25 MG capsule Take 1 capsule (25 mg total) by mouth 3 (three) times daily as needed for itching. 30 capsule 2  . ipratropium (ATROVENT) 0.06 % nasal spray Place 2 sprays into both nostrils 4 (four) times daily. 15 mL 0  . metoprolol tartrate (LOPRESSOR) 25 MG tablet Take 25 mg by mouth 2 (two) times daily.  6  . triamcinolone (KENALOG) 0.025 % ointment Apply 1 application topically 2 (two) times daily. AV graft rash 30 g 0  . hydrALAZINE (APRESOLINE) 10 MG tablet Take 2 tablets (20 mg total) by mouth 3 (three) times daily. 120 tablet 3  .  pantoprazole (PROTONIX) 40 MG tablet Take 1 tablet (40 mg total) by mouth 2 (two) times daily. 60 tablet 1   No current facility-administered medications for this visit.     Allergies  Allergen Reactions  . Cefazolin     Severe thrombocytopenia    REVIEW OF SYSTEMS (negative unless checked):   Cardiac:  []  Chest pain or chest pressure? []  Shortness of breath upon activity? []  Shortness of breath when lying flat? []  Irregular heart rhythm?  Vascular:  []  Pain in calf, thigh, or hip brought on by walking? []  Pain in feet at night that wakes you up from your sleep? []  Blood clot in your veins? []  Leg swelling?  Pulmonary:  []  Oxygen at home? []  Productive cough? []  Wheezing?  Neurologic:  []  Sudden weakness in arms or legs? []  Sudden numbness in arms or legs? []  Sudden onset of difficult speaking or slurred speech? []  Temporary loss of vision in one eye? []  Problems with dizziness?  Gastrointestinal:  []  Blood in stool? []  Vomited blood?  Genitourinary:  []  Burning when urinating? []  Blood in urine? []  end stage renal disease-HD: T/R/S   Psychiatric:  []  Major depression  Hematologic:  []  Bleeding problems? []  Problems with blood clotting?  Dermatologic:  []  Rashes or ulcers?  Constitutional:  []  Fever or chills?  Ear/Nose/Throat:  []  Change in hearing? []  Nose bleeds? []  Sore throat?  Musculoskeletal:  []  Back pain? []  Joint pain? []  Muscle pain?   Physical Examination     Vitals:   01/21/17 1115  BP: 140/87  Pulse: 74  Temp: 97.7 F (36.5 C)  Weight: 128 lb 9.6 oz (58.3 kg)  Height: 5\' 7"  (1.702 m)   Body mass index is 20.14 kg/m.  General Alert, O x 3, WD, NAD  Pulmonary Sym exp, good B air movt, CTA B  Cardiac RRR, Nl S1, S2, no Murmurs, No rubs, No S3,S4  Vascular Vessel Right Left  Radial Palpable Palpable  Brachial Palpable Palpable  Carotid Palpable, No Bruit Palpable, No Bruit  Aorta Not palpable N/A  Femoral Palpable  Palpable  Popliteal Not palpable Not palpable  PT Palpable Palpable  DP Palpable Palpable    Gastro- intestinal soft, non-distended, non-tender to palpation, No guarding or rebound, no HSM, no masses, no CVAT B, No palpable prominent aortic pulse,    Musculo- skeletal M/S 5/5 throughout  , Extremities without ischemic changes  , No edema present, No visible varicosities , No Lipodermatosclerosis present, palpable thrill in R BVT, two moderate-large PSA at likely cannulation site, skin at both site thinned and shiny  Neurologic Pain and light touch intact in extremities , Motor exam as listed above  Psychiatric Judgement intact, Mood &  affect appropriate for pt's clinical situation    Medical Decision Making   Karen Morrow is a 56 y.o. female who presents with end stage renal disease, pseudoaneursmal degeneration of R BVT x 2   I agree that the two pseudoaneurysms are at risk for bleeding, so I would proceed with: plication of R BVT PSA x 2.  Given the coming holiday, scheduling will be tight, so will try to schedule this coming 30 AUG 18 and reschedule her HD to later in the day.  I had an extensive discussion with this patient in regards to the nature of access surgery, including risk, benefits, and alternatives.    The patient is aware that the risks of access surgery include but are not limited to: bleeding, infection, steal syndrome, nerve damage, ischemic monomelic neuropathy, failure of access to mature, complications related to venous hypertension, and possible need for additional access procedures in the future.  The patient has agreed to proceed with the above procedure which will be scheduled 30 AUG 18.   Adele Barthel, MD, FACS [facility] Office: [phone] Pager: [phone]  01/21/2017, 11:49 AM

## 2017-01-22 DIAGNOSIS — N186 End stage renal disease: Secondary | ICD-10-CM | POA: Diagnosis not present

## 2017-01-22 DIAGNOSIS — D631 Anemia in chronic kidney disease: Secondary | ICD-10-CM | POA: Diagnosis not present

## 2017-01-22 DIAGNOSIS — N2581 Secondary hyperparathyroidism of renal origin: Secondary | ICD-10-CM | POA: Diagnosis not present

## 2017-01-25 DIAGNOSIS — N186 End stage renal disease: Secondary | ICD-10-CM | POA: Diagnosis not present

## 2017-01-25 DIAGNOSIS — D631 Anemia in chronic kidney disease: Secondary | ICD-10-CM | POA: Diagnosis not present

## 2017-01-25 DIAGNOSIS — N2581 Secondary hyperparathyroidism of renal origin: Secondary | ICD-10-CM | POA: Diagnosis not present

## 2017-01-27 ENCOUNTER — Encounter (HOSPITAL_COMMUNITY): Payer: Self-pay | Admitting: *Deleted

## 2017-01-27 ENCOUNTER — Other Ambulatory Visit: Payer: Self-pay

## 2017-01-27 DIAGNOSIS — N186 End stage renal disease: Secondary | ICD-10-CM | POA: Diagnosis not present

## 2017-01-27 DIAGNOSIS — D631 Anemia in chronic kidney disease: Secondary | ICD-10-CM | POA: Diagnosis not present

## 2017-01-27 DIAGNOSIS — N2581 Secondary hyperparathyroidism of renal origin: Secondary | ICD-10-CM | POA: Diagnosis not present

## 2017-01-27 NOTE — Progress Notes (Signed)
9:00  

## 2017-01-27 NOTE — Progress Notes (Signed)
Spoke with pt for pre-op call. Pt denies cardiac history, chest pain or sob. Pt states she is not diabetic.  

## 2017-01-28 ENCOUNTER — Encounter (HOSPITAL_COMMUNITY)
Admission: RE | Disposition: A | Discharge status: Home / Self Care | Payer: Self-pay | Source: Ambulatory Visit | Attending: Vascular Surgery

## 2017-01-28 ENCOUNTER — Ambulatory Visit: Payer: Medicare Other | Admitting: Gastroenterology

## 2017-01-28 ENCOUNTER — Ambulatory Visit (HOSPITAL_COMMUNITY)
Admission: RE | Admit: 2017-01-28 | Discharge: 2017-01-28 | Disposition: A | Discharge status: Home / Self Care | Payer: Medicare Other | Source: Ambulatory Visit | Attending: Vascular Surgery | Admitting: Vascular Surgery

## 2017-01-28 ENCOUNTER — Ambulatory Visit (HOSPITAL_COMMUNITY): Payer: Medicare Other | Admitting: Anesthesiology

## 2017-01-28 ENCOUNTER — Encounter (HOSPITAL_COMMUNITY): Payer: Self-pay | Admitting: Certified Registered Nurse Anesthetist

## 2017-01-28 DIAGNOSIS — F17210 Nicotine dependence, cigarettes, uncomplicated: Secondary | ICD-10-CM | POA: Diagnosis not present

## 2017-01-28 DIAGNOSIS — N186 End stage renal disease: Secondary | ICD-10-CM | POA: Insufficient documentation

## 2017-01-28 DIAGNOSIS — I729 Aneurysm of unspecified site: Secondary | ICD-10-CM | POA: Diagnosis not present

## 2017-01-28 DIAGNOSIS — K219 Gastro-esophageal reflux disease without esophagitis: Secondary | ICD-10-CM | POA: Diagnosis not present

## 2017-01-28 DIAGNOSIS — I120 Hypertensive chronic kidney disease with stage 5 chronic kidney disease or end stage renal disease: Secondary | ICD-10-CM | POA: Diagnosis not present

## 2017-01-28 DIAGNOSIS — Z992 Dependence on renal dialysis: Secondary | ICD-10-CM | POA: Diagnosis not present

## 2017-01-28 DIAGNOSIS — J45909 Unspecified asthma, uncomplicated: Secondary | ICD-10-CM | POA: Insufficient documentation

## 2017-01-28 DIAGNOSIS — D631 Anemia in chronic kidney disease: Secondary | ICD-10-CM | POA: Diagnosis not present

## 2017-01-28 DIAGNOSIS — Y832 Surgical operation with anastomosis, bypass or graft as the cause of abnormal reaction of the patient, or of later complication, without mention of misadventure at the time of the procedure: Secondary | ICD-10-CM | POA: Insufficient documentation

## 2017-01-28 DIAGNOSIS — T82868A Thrombosis of vascular prosthetic devices, implants and grafts, initial encounter: Secondary | ICD-10-CM | POA: Insufficient documentation

## 2017-01-28 DIAGNOSIS — T82898A Other specified complication of vascular prosthetic devices, implants and grafts, initial encounter: Secondary | ICD-10-CM | POA: Diagnosis not present

## 2017-01-28 HISTORY — PX: REVISON OF ARTERIOVENOUS FISTULA: SHX6074

## 2017-01-28 HISTORY — DX: Change in bowel habit: R19.4

## 2017-01-28 LAB — BASIC METABOLIC PANEL
Anion gap: 13 (ref 5–15)
BUN: 24 mg/dL — ABNORMAL HIGH (ref 6–20)
CO2: 28 mmol/L (ref 22–32)
Calcium: 9.7 mg/dL (ref 8.9–10.3)
Chloride: 94 mmol/L — ABNORMAL LOW (ref 101–111)
Creatinine, Ser: 6.86 mg/dL — ABNORMAL HIGH (ref 0.44–1.00)
GFR calc Af Amer: 7 mL/min — ABNORMAL LOW (ref >60)
GFR calc non Af Amer: 6 mL/min — ABNORMAL LOW (ref >60)
Glucose, Bld: 78 mg/dL (ref 65–99)
Potassium: 4.8 mmol/L (ref 3.5–5.1)
Sodium: 135 mmol/L (ref 135–145)

## 2017-01-28 SURGERY — REVISON OF ARTERIOVENOUS FISTULA
Anesthesia: Monitor Anesthesia Care | Site: Arm Upper | Laterality: Right

## 2017-01-28 MED ORDER — LIDOCAINE-EPINEPHRINE (PF) 1 %-1:200000 IJ SOLN
INTRAMUSCULAR | Status: DC | PRN
  Administered 2017-01-28: 17 mL

## 2017-01-28 MED ORDER — ONDANSETRON HCL 4 MG/2ML IJ SOLN: 4.0000 mg | Freq: Four times a day (QID) | INTRAMUSCULAR | Status: DC | PRN

## 2017-01-28 MED ORDER — OXYCODONE HCL 5 MG PO TABS: 5.0000 mg | ORAL_TABLET | Freq: Once | ORAL | Status: DC | PRN

## 2017-01-28 MED ORDER — VANCOMYCIN HCL IN DEXTROSE 1-5 GM/200ML-% IV SOLN
1000.0000 mg | INTRAVENOUS | Status: AC
  Administered 2017-01-28: 1000 mg via INTRAVENOUS
  Filled 2017-01-28: qty 200

## 2017-01-28 MED ORDER — CHLORHEXIDINE GLUCONATE 4 % EX LIQD: 60.0000 mL | Freq: Once | CUTANEOUS | Status: DC

## 2017-01-28 MED ORDER — PROPOFOL 500 MG/50ML IV EMUL
INTRAVENOUS | Status: DC | PRN
  Administered 2017-01-28: 50 ug/kg/min via INTRAVENOUS

## 2017-01-28 MED ORDER — MIDAZOLAM HCL 5 MG/5ML IJ SOLN
INTRAMUSCULAR | Status: DC | PRN
  Administered 2017-01-28: 2 mg via INTRAVENOUS

## 2017-01-28 MED ORDER — OXYCODONE-ACETAMINOPHEN 5-325 MG PO TABS: 1.0000 | ORAL_TABLET | Freq: Four times a day (QID) | ORAL | 0 refills | Status: DC | PRN

## 2017-01-28 MED ORDER — PROTAMINE SULFATE 10 MG/ML IV SOLN
INTRAVENOUS | Status: DC | PRN
  Administered 2017-01-28: 40 mg via INTRAVENOUS

## 2017-01-28 MED ORDER — LIDOCAINE-EPINEPHRINE (PF) 1 %-1:200000 IJ SOLN
INTRAMUSCULAR | Status: AC
  Filled 2017-01-28: qty 30

## 2017-01-28 MED ORDER — LIDOCAINE HCL (PF) 1 % IJ SOLN
INTRAMUSCULAR | Status: AC
  Filled 2017-01-28: qty 30

## 2017-01-28 MED ORDER — OXYCODONE-ACETAMINOPHEN 5-325 MG PO TABS
1.0000 | ORAL_TABLET | Freq: Once | ORAL | Status: AC
  Administered 2017-01-28: 2 via ORAL

## 2017-01-28 MED ORDER — SODIUM CHLORIDE 0.9 % IV SOLN
INTRAVENOUS | Status: DC | PRN
  Administered 2017-01-28: 12:00:00 500 mL

## 2017-01-28 MED ORDER — HEMOSTATIC AGENTS (NO CHARGE) OPTIME
TOPICAL | Status: DC | PRN
  Administered 2017-01-28 (×2): 1 via TOPICAL

## 2017-01-28 MED ORDER — PROTAMINE SULFATE 10 MG/ML IV SOLN
INTRAVENOUS | Status: AC
  Filled 2017-01-28: qty 5

## 2017-01-28 MED ORDER — PROPOFOL 10 MG/ML IV BOLUS
INTRAVENOUS | Status: DC | PRN
  Administered 2017-01-28 (×2): 20 mg via INTRAVENOUS

## 2017-01-28 MED ORDER — SODIUM CHLORIDE 0.9 % IV SOLN
INTRAVENOUS | Status: DC
  Administered 2017-01-28: 10 mL/h via INTRAVENOUS

## 2017-01-28 MED ORDER — FENTANYL CITRATE (PF) 100 MCG/2ML IJ SOLN
INTRAMUSCULAR | Status: DC | PRN
  Administered 2017-01-28 (×3): 50 ug via INTRAVENOUS

## 2017-01-28 MED ORDER — SODIUM CHLORIDE 0.9 % IR SOLN
Status: DC | PRN
  Administered 2017-01-28: 1000 mL

## 2017-01-28 MED ORDER — HEPARIN SODIUM (PORCINE) 1000 UNIT/ML IJ SOLN
INTRAMUSCULAR | Status: DC | PRN
  Administered 2017-01-28: 5000 [IU] via INTRAVENOUS

## 2017-01-28 MED ORDER — OXYCODONE-ACETAMINOPHEN 5-325 MG PO TABS
ORAL_TABLET | ORAL | Status: AC
  Administered 2017-01-28: 2 via ORAL
  Filled 2017-01-28: qty 2

## 2017-01-28 MED ORDER — MIDAZOLAM HCL 2 MG/2ML IJ SOLN
INTRAMUSCULAR | Status: AC
  Filled 2017-01-28: qty 2

## 2017-01-28 MED ORDER — FENTANYL CITRATE (PF) 100 MCG/2ML IJ SOLN: 25.0000 ug | INTRAMUSCULAR | Status: DC | PRN

## 2017-01-28 MED ORDER — OXYCODONE HCL 5 MG/5ML PO SOLN: 5.0000 mg | Freq: Once | ORAL | Status: DC | PRN

## 2017-01-28 MED ORDER — FENTANYL CITRATE (PF) 250 MCG/5ML IJ SOLN
INTRAMUSCULAR | Status: AC
  Filled 2017-01-28: qty 5

## 2017-01-28 MED ORDER — DEXTROSE 5 % IV SOLN
INTRAVENOUS | Status: DC | PRN
  Administered 2017-01-28: 25 ug/min via INTRAVENOUS

## 2017-01-28 MED ORDER — HEPARIN SODIUM (PORCINE) 1000 UNIT/ML IJ SOLN
INTRAMUSCULAR | Status: AC
  Filled 2017-01-28: qty 2

## 2017-01-28 MED ORDER — PROPOFOL 500 MG/50ML IV EMUL: INTRAVENOUS | Status: DC | PRN

## 2017-01-28 SURGICAL SUPPLY — 40 items
ARMBAND PINK RESTRICT EXTREMIT (MISCELLANEOUS) ×2 IMPLANT
CANISTER SUCT 3000ML PPV (MISCELLANEOUS) ×2 IMPLANT
CANNULA VESSEL 3MM 2 BLNT TIP (CANNULA) ×2 IMPLANT
CLIP VESOCCLUDE MED 6/CT (CLIP) ×2 IMPLANT
CLIP VESOCCLUDE SM WIDE 6/CT (CLIP) ×2 IMPLANT
COVER PROBE W GEL 5X96 (DRAPES) IMPLANT
DERMABOND ADVANCED (GAUZE/BANDAGES/DRESSINGS) ×1
DERMABOND ADVANCED .7 DNX12 (GAUZE/BANDAGES/DRESSINGS) ×1 IMPLANT
ELECT REM PT RETURN 9FT ADLT (ELECTROSURGICAL) ×2
ELECTRODE REM PT RTRN 9FT ADLT (ELECTROSURGICAL) ×1 IMPLANT
GLOVE BIO SURGEON STRL SZ7.5 (GLOVE) ×2 IMPLANT
GLOVE BIOGEL PI IND STRL 6.5 (GLOVE) ×1 IMPLANT
GLOVE BIOGEL PI IND STRL 7.5 (GLOVE) ×1 IMPLANT
GLOVE BIOGEL PI IND STRL 8 (GLOVE) ×1 IMPLANT
GLOVE BIOGEL PI INDICATOR 6.5 (GLOVE) ×1
GLOVE BIOGEL PI INDICATOR 7.5 (GLOVE) ×1
GLOVE BIOGEL PI INDICATOR 8 (GLOVE) ×1
GLOVE ECLIPSE 7.0 STRL STRAW (GLOVE) ×2 IMPLANT
GLOVE SURG SS PI 7.0 STRL IVOR (GLOVE) ×2 IMPLANT
GOWN STRL REUS W/ TWL LRG LVL3 (GOWN DISPOSABLE) ×2 IMPLANT
GOWN STRL REUS W/ TWL XL LVL3 (GOWN DISPOSABLE) ×1 IMPLANT
GOWN STRL REUS W/TWL LRG LVL3 (GOWN DISPOSABLE) ×2
GOWN STRL REUS W/TWL XL LVL3 (GOWN DISPOSABLE) ×1
GRAFT GORETEX STRETCH 8MX10CM (Vascular Products) ×2 IMPLANT
HEMOSTAT SPONGE AVITENE ULTRA (HEMOSTASIS) ×4 IMPLANT
KIT BASIN OR (CUSTOM PROCEDURE TRAY) ×2 IMPLANT
KIT ROOM TURNOVER OR (KITS) ×2 IMPLANT
NEEDLE HYPO 25GX1X1/2 BEV (NEEDLE) ×2 IMPLANT
NS IRRIG 1000ML POUR BTL (IV SOLUTION) ×2 IMPLANT
PACK CV ACCESS (CUSTOM PROCEDURE TRAY) ×2 IMPLANT
PAD ARMBOARD 7.5X6 YLW CONV (MISCELLANEOUS) ×4 IMPLANT
SPONGE SURGIFOAM ABS GEL 100 (HEMOSTASIS) IMPLANT
SUT PROLENE 5 0 C 1 24 (SUTURE) ×4 IMPLANT
SUT PROLENE 6 0 BV (SUTURE) ×10 IMPLANT
SUT VIC AB 3-0 SH 27 (SUTURE) ×2
SUT VIC AB 3-0 SH 27X BRD (SUTURE) ×2 IMPLANT
SUT VIC AB 4-0 PS2 27 (SUTURE) ×2 IMPLANT
SUT VICRYL 4-0 PS2 18IN ABS (SUTURE) ×2 IMPLANT
UNDERPAD 30X30 (UNDERPADS AND DIAPERS) ×2 IMPLANT
WATER STERILE IRR 1000ML POUR (IV SOLUTION) ×2 IMPLANT

## 2017-01-28 NOTE — Anesthesia Procedure Notes (Signed)
Procedure Name: MAC Date/Time: 01/28/2017 12:08 PM Performed by: Everlean Cherry A Pre-anesthesia Checklist: Patient identified, Emergency Drugs available, Suction available and Patient being monitored Patient Re-evaluated:Patient Re-evaluated prior to induction Oxygen Delivery Method: Simple face mask

## 2017-01-28 NOTE — Op Note (Signed)
    NAME: Karen Morrow    MRN: [id number] DOB: [birth year]    DATE OF OPERATION: 01/28/2017  PREOP DIAGNOSIS:    Aneurysmal right upper arm AV fistula  POSTOP DIAGNOSIS:    Same  PROCEDURE:    Excision of 2 large aneurysms of right upper arm AV fistula with revision of fistula using interposition 8 mm PTFE graft  SURGEON: Judeth Cornfield. Scot Dock, MD, FACS  ASSIST: Gerri Lins PA  ANESTHESIA: Local with sedation   EBL: Minimal  INDICATIONS:    Karen Morrow is a 56 y.o. female who presented with 2 large aneurysms of her right upper arm AV fistula. It was felt that she was at risk for continued expansion and bleeding and was set up for possible plication of these aneurysms.  FINDINGS:    The areas of concern had multiple cystic thin areas with really no safe area to cannulate the fistula. For this reason plication was not possible and I replaced both aneurysms with an interposition 8 mm PTFE graft  TECHNIQUE:   The patient was taken to the operating room and sedated by anesthesia. The right upper extremity was prepped and draped in usual sterile fashion. Using a elliptical incisions over both aneurysms, after the skin was anesthetized, both large aneurysms were dissected free circumferentially. A skin bridge was left between the 2 incisions. The aneurysms were very thin-walled and had multiple cystic structures with no healthy-appearing vein could ultimately be cannulated. For this reason I did not think plication was possible. The patient was heparinized. The fistula was clamped proximally and distally. Both large aneurysms were resected. An interposition 8 mm PTFE graft was brought between the skin bridge. The proximal end of the graft was sewn end-to-end to the vein using continuous 6-0 Prolene suture. At the distal end, there was a significant size mismatch. This reason I spatulated the graft and this was sewn end to end to the vein using continuous 6-0 Prolene suture. At  the completion was an excellent thrill in the fistula and a good radial signal with Doppler. Heparin was partially reversed with protamine. Hemostasis was obtained in the wounds. 2 incisions were each closed with a deep layer of 3-0 Vicryl. The skin was closed with 4-0 Vicryl. Dermabond was applied. The patient tolerated the procedure well and was transferred to the recovery room in stable condition. All needle and sponge counts were correct.   Deitra Mayo, MD, FACS [facility]  DATE OF DICTATION:   01/28/2017

## 2017-01-28 NOTE — Anesthesia Preprocedure Evaluation (Signed)
Anesthesia Evaluation  Patient identified by MRN, date of birth, ID band Patient awake    Reviewed: Allergy & Precautions, H&P , NPO status , Patient's Chart, lab work & pertinent test results  Airway Mallampati: II   Neck ROM: full    Dental   Pulmonary shortness of breath, asthma , Current Smoker,    breath sounds clear to auscultation       Cardiovascular hypertension,  Rhythm:regular Rate:Normal     Neuro/Psych    GI/Hepatic PUD, GERD  ,  Endo/Other    Renal/GU ESRF and DialysisRenal disease     Musculoskeletal   Abdominal   Peds  Hematology   Anesthesia Other Findings   Reproductive/Obstetrics                             Anesthesia Physical Anesthesia Plan  ASA: IV  Anesthesia Plan: MAC   Post-op Pain Management:    Induction: Intravenous  PONV Risk Score and Plan: 1 and Ondansetron, Dexamethasone, Propofol infusion and Treatment may vary due to age or medical condition  Airway Management Planned: Simple Face Mask  Additional Equipment:   Intra-op Plan:   Post-operative Plan:   Informed Consent: I have reviewed the patients History and Physical, chart, labs and discussed the procedure including the risks, benefits and alternatives for the proposed anesthesia with the patient or authorized representative who has indicated his/her understanding and acceptance.     Plan Discussed with: CRNA, Anesthesiologist and Surgeon  Anesthesia Plan Comments:         Anesthesia Quick Evaluation

## 2017-01-28 NOTE — Transfer of Care (Signed)
Immediate Anesthesia Transfer of Care Note  Patient: Karen Morrow  Procedure(s) Performed: Procedure(s): REVISON OF RIGHT ARM ARTERIOVENOUS FISTULA USING 8MMX10CM GORETEX GRAFT (Right)  Patient Location: PACU  Anesthesia Type:MAC  Level of Consciousness: awake, alert , oriented and patient cooperative  Airway & Oxygen Therapy: Patient Spontanous Breathing and Patient connected to face mask oxygen  Post-op Assessment: Report given to RN, Post -op Vital signs reviewed and stable and Patient moving all extremities X 4  Post vital signs: Reviewed and stable  Last Vitals:  Vitals:   01/28/17 0946 01/28/17 1416  BP: 112/83 102/74  Pulse:  73  Resp:  19  Temp:    SpO2:  100%    Last Pain:  Vitals:   01/28/17 0914  TempSrc: Oral      Patients Stated Pain Goal: 3 (25/49/82 6415)  Complications: No apparent anesthesia complications

## 2017-01-28 NOTE — H&P (View-Only) (Signed)
Requested by:  Boykin Nearing, MD [place], [place] [address]  Reason for consultation: New access   History of Present Illness   Karen Morrow is a 56 y.o. 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  This patient had the R BVT placed 03/06/14 by Dr. Donnetta Hutching.  Since then the patient has been using the R BVT without any steal sx.  She has developed some bleeding issues at the most aneurysmal segments of the fistula.  She was sent her as her nephrologist is worried the patient is at risk for rupture of the fistula.  Past Medical History:  Diagnosis Date  . Anginal pain ([place])   . Asthma   . Chronic kidney disease    dialysis 3x wk  . Dyspnea   . GERD (gastroesophageal reflux disease)   . Hepatitis C antibody test positive   . Hypertension    "just dx'd today" (11/03/2012)    Past Surgical History:  Procedure Laterality Date  . ABDOMINAL HYSTERECTOMY     "partial" (11/03/2012)  . AV FISTULA PLACEMENT Right 12/03/2013   Procedure: CREATION OF ARTERIOVENOUS (AV) FISTULA RIGHT ARM ;  Surgeon: Rosetta Posner, MD;  Location: [facility];  Service: Vascular;  Laterality: Right;  . BASCILIC VEIN TRANSPOSITION Right 03/06/2014   Procedure: SECOND STAGE BASILIC VEIN TRANSPOSITION;  Surgeon: Rosetta Posner, MD;  Location[facility] OR;  Service: Vascular;  Laterality: Right;  . ESOPHAGOGASTRODUODENOSCOPY N/A 06/05/2013   Procedure: ESOPHAGOGASTRODUODENOSCOPY (EGD);  Surgeon: Winfield Cunas., MD;  Location: [facility];  Service: Endoscopy;  Laterality: N/A;  . ESOPHAGOGASTRODUODENOSCOPY N/A 09/08/2016   Procedure: ESOPHAGOGASTRODUODENOSCOPY (EGD);  Surgeon: Milus Banister, MD;  Location: [place];  Service: Endoscopy;  Laterality: N/A;    Social History   Social History  . Marital status: Married    Spouse name: N/A  . Number of children: N/A  . Years of education: N/A   Occupational History  . Not on file.   Social History Main Topics  . Smoking  status: Light Tobacco Smoker    Packs/day: 0.50    Years: 20.00    Types: Cigarettes  . Smokeless tobacco: Never Used  . Alcohol use 0.6 oz/week    1 Glasses of wine per week     Comment: 09/07/16-pt reports less than 1 glass of wine/week. 11/03/2012 "1-2 40oz beers/day maybe"   11/17/15 "I don;t drink beer anymore since Ive been on dialysis". p  . Drug use: Yes    Types: Marijuana     Comment: 11/03/2012 "maybe once or twice/wk" 11/17/15 "I haven't smoked in 3-4 months"  . Sexual activity: Not Currently   Other Topics Concern  . Not on file   Social History Narrative  . No narrative on file    Family History  Problem Relation Age of Onset  . Lupus Mother   . Diabetes Father   . Heart attack Father     Current Outpatient Prescriptions  Medication Sig Dispense Refill  . albuterol (PROVENTIL HFA;VENTOLIN HFA) 108 (90 Base) MCG/ACT inhaler Inhale 2 puffs into the lungs every 6 (six) hours as needed for wheezing or shortness of breath. 1 Inhaler 0  . albuterol (PROVENTIL) (2.5 MG/3ML) 0.083% nebulizer solution Take 3 mLs (2.5 mg total) by nebulization every 6 (six) hours as needed for wheezing or shortness of breath. 150 mL 3  . amLODipine (NORVASC) 10 MG tablet Take 10 mg by mouth daily after breakfast.   6  .  guaiFENesin (MUCINEX) 600 MG 12 hr tablet Take 1 tablet (600 mg total) by mouth 2 (two) times daily. 60 tablet 5  . hydrOXYzine (VISTARIL) 25 MG capsule Take 1 capsule (25 mg total) by mouth 3 (three) times daily as needed for itching. 30 capsule 2  . ipratropium (ATROVENT) 0.06 % nasal spray Place 2 sprays into both nostrils 4 (four) times daily. 15 mL 0  . metoprolol tartrate (LOPRESSOR) 25 MG tablet Take 25 mg by mouth 2 (two) times daily.  6  . triamcinolone (KENALOG) 0.025 % ointment Apply 1 application topically 2 (two) times daily. AV graft rash 30 g 0  . hydrALAZINE (APRESOLINE) 10 MG tablet Take 2 tablets (20 mg total) by mouth 3 (three) times daily. 120 tablet 3  .  pantoprazole (PROTONIX) 40 MG tablet Take 1 tablet (40 mg total) by mouth 2 (two) times daily. 60 tablet 1   No current facility-administered medications for this visit.     Allergies  Allergen Reactions  . Cefazolin     Severe thrombocytopenia    REVIEW OF SYSTEMS (negative unless checked):   Cardiac:  []  Chest pain or chest pressure? []  Shortness of breath upon activity? []  Shortness of breath when lying flat? []  Irregular heart rhythm?  Vascular:  []  Pain in calf, thigh, or hip brought on by walking? []  Pain in feet at night that wakes you up from your sleep? []  Blood clot in your veins? []  Leg swelling?  Pulmonary:  []  Oxygen at home? []  Productive cough? []  Wheezing?  Neurologic:  []  Sudden weakness in arms or legs? []  Sudden numbness in arms or legs? []  Sudden onset of difficult speaking or slurred speech? []  Temporary loss of vision in one eye? []  Problems with dizziness?  Gastrointestinal:  []  Blood in stool? []  Vomited blood?  Genitourinary:  []  Burning when urinating? []  Blood in urine? []  end stage renal disease-HD: T/R/S   Psychiatric:  []  Major depression  Hematologic:  []  Bleeding problems? []  Problems with blood clotting?  Dermatologic:  []  Rashes or ulcers?  Constitutional:  []  Fever or chills?  Ear/Nose/Throat:  []  Change in hearing? []  Nose bleeds? []  Sore throat?  Musculoskeletal:  []  Back pain? []  Joint pain? []  Muscle pain?   Physical Examination     Vitals:   01/21/17 1115  BP: 140/87  Pulse: 74  Temp: 97.7 F (36.5 C)  Weight: 128 lb 9.6 oz (58.3 kg)  Height: 5\' 7"  (1.702 m)   Body mass index is 20.14 kg/m.  General Alert, O x 3, WD, NAD  Pulmonary Sym exp, good B air movt, CTA B  Cardiac RRR, Nl S1, S2, no Murmurs, No rubs, No S3,S4  Vascular Vessel Right Left  Radial Palpable Palpable  Brachial Palpable Palpable  Carotid Palpable, No Bruit Palpable, No Bruit  Aorta Not palpable N/A  Femoral Palpable  Palpable  Popliteal Not palpable Not palpable  PT Palpable Palpable  DP Palpable Palpable    Gastro- intestinal soft, non-distended, non-tender to palpation, No guarding or rebound, no HSM, no masses, no CVAT B, No palpable prominent aortic pulse,    Musculo- skeletal M/S 5/5 throughout  , Extremities without ischemic changes  , No edema present, No visible varicosities , No Lipodermatosclerosis present, palpable thrill in R BVT, two moderate-large PSA at likely cannulation site, skin at both site thinned and shiny  Neurologic Pain and light touch intact in extremities , Motor exam as listed above  Psychiatric Judgement intact, Mood &  affect appropriate for pt's clinical situation    Medical Decision Making   ILIA DIMAANO is a 56 y.o. female who presents with end stage renal disease, pseudoaneursmal degeneration of R BVT x 2   I agree that the two pseudoaneurysms are at risk for bleeding, so I would proceed with: plication of R BVT PSA x 2.  Given the coming holiday, scheduling will be tight, so will try to schedule this coming 30 AUG 18 and reschedule her HD to later in the day.  I had an extensive discussion with this patient in regards to the nature of access surgery, including risk, benefits, and alternatives.    The patient is aware that the risks of access surgery include but are not limited to: bleeding, infection, steal syndrome, nerve damage, ischemic monomelic neuropathy, failure of access to mature, complications related to venous hypertension, and possible need for additional access procedures in the future.  The patient has agreed to proceed with the above procedure which will be scheduled 30 AUG 18.   Adele Barthel, MD, FACS [facility] Office: [phone] Pager: [phone]  01/21/2017, 11:49 AM

## 2017-01-28 NOTE — Interval H&P Note (Signed)
History and Physical Interval Note:  01/28/2017 11:21 AM  Karen Morrow  has presented today for surgery, with the diagnosis of Pseudoaneurysm Left Arm Basilic Vein Transposition  I72.9  The various methods of treatment have been discussed with the patient and family. After consideration of risks, benefits and other options for treatment, the patient has consented to  Procedure(s): PLICATION OF LEFT ARM BASILIC VEIN FISTULA (Left) as a surgical intervention .  The patient's history has been reviewed, patient examined, no change in status, stable for surgery.  I have reviewed the patient's chart and labs.  Questions were answered to the patient's satisfaction.     Deitra Mayo

## 2017-01-29 DIAGNOSIS — D631 Anemia in chronic kidney disease: Secondary | ICD-10-CM | POA: Diagnosis not present

## 2017-01-29 DIAGNOSIS — Z23 Encounter for immunization: Secondary | ICD-10-CM | POA: Diagnosis not present

## 2017-01-29 DIAGNOSIS — N186 End stage renal disease: Secondary | ICD-10-CM | POA: Diagnosis not present

## 2017-01-29 DIAGNOSIS — N2581 Secondary hyperparathyroidism of renal origin: Secondary | ICD-10-CM | POA: Diagnosis not present

## 2017-01-29 NOTE — Anesthesia Postprocedure Evaluation (Signed)
Anesthesia Post Note  Patient: Karen Morrow  Procedure(s) Performed: Procedure(s) (LRB): REVISON OF RIGHT ARM ARTERIOVENOUS FISTULA USING 8MMX10CM GORETEX GRAFT (Right)     Patient location during evaluation: PACU Anesthesia Type: MAC Level of consciousness: awake and alert Pain management: pain level controlled Vital Signs Assessment: post-procedure vital signs reviewed and stable Respiratory status: spontaneous breathing, nonlabored ventilation, respiratory function stable and patient connected to nasal cannula oxygen Cardiovascular status: stable and blood pressure returned to baseline Anesthetic complications: no    Last Vitals:  Vitals:   01/28/17 1452 01/28/17 1530  BP: 99/77   Pulse: 82   Resp: 19   Temp: 36.7 C   SpO2: 100% 100%    Last Pain:  Vitals:   01/28/17 0914  TempSrc: Oral                 Itzabella Sorrels S

## 2017-01-31 ENCOUNTER — Encounter (HOSPITAL_COMMUNITY): Payer: Self-pay | Admitting: Vascular Surgery

## 2017-02-01 ENCOUNTER — Telehealth: Payer: Self-pay | Admitting: Vascular Surgery

## 2017-02-01 DIAGNOSIS — D631 Anemia in chronic kidney disease: Secondary | ICD-10-CM | POA: Diagnosis not present

## 2017-02-01 DIAGNOSIS — N2581 Secondary hyperparathyroidism of renal origin: Secondary | ICD-10-CM | POA: Diagnosis not present

## 2017-02-01 DIAGNOSIS — Z23 Encounter for immunization: Secondary | ICD-10-CM | POA: Diagnosis not present

## 2017-02-01 DIAGNOSIS — N186 End stage renal disease: Secondary | ICD-10-CM | POA: Diagnosis not present

## 2017-02-01 LAB — POCT I-STAT 4, (NA,K, GLUC, HGB,HCT)
Glucose, Bld: 80 mg/dL (ref 65–99)
HCT: 42 % (ref 36.0–46.0)
Hemoglobin: 14.3 g/dL (ref 12.0–15.0)
Potassium: >8.5 mmol/L (ref 3.5–5.1)
Sodium: 127 mmol/L — ABNORMAL LOW (ref 135–145)

## 2017-02-01 NOTE — Telephone Encounter (Signed)
-----   Message from Mena Goes, RN sent at 01/28/2017  2:52 PM EDT ----- Regarding: 4-5 weeks no duplex   ----- Message ----- From: Ulyses Amor, PA-C Sent: 01/28/2017   2:25 PM To: [facility] Charge Pool  F/U in 4-5 weeks with Dr. Scot Dock s/p revision of left av fistula with interpositional graft.  Revision AV fistula No duplex

## 2017-02-01 NOTE — Telephone Encounter (Signed)
Spoke to pt to sch appt 03/09/17 at 10:15.

## 2017-02-03 DIAGNOSIS — N186 End stage renal disease: Secondary | ICD-10-CM | POA: Diagnosis not present

## 2017-02-03 DIAGNOSIS — Z23 Encounter for immunization: Secondary | ICD-10-CM | POA: Diagnosis not present

## 2017-02-03 DIAGNOSIS — N2581 Secondary hyperparathyroidism of renal origin: Secondary | ICD-10-CM | POA: Diagnosis not present

## 2017-02-03 DIAGNOSIS — D631 Anemia in chronic kidney disease: Secondary | ICD-10-CM | POA: Diagnosis not present

## 2017-02-05 DIAGNOSIS — D631 Anemia in chronic kidney disease: Secondary | ICD-10-CM | POA: Diagnosis not present

## 2017-02-05 DIAGNOSIS — N2581 Secondary hyperparathyroidism of renal origin: Secondary | ICD-10-CM | POA: Diagnosis not present

## 2017-02-05 DIAGNOSIS — Z23 Encounter for immunization: Secondary | ICD-10-CM | POA: Diagnosis not present

## 2017-02-05 DIAGNOSIS — N186 End stage renal disease: Secondary | ICD-10-CM | POA: Diagnosis not present

## 2017-02-08 DIAGNOSIS — Z23 Encounter for immunization: Secondary | ICD-10-CM | POA: Diagnosis not present

## 2017-02-08 DIAGNOSIS — D631 Anemia in chronic kidney disease: Secondary | ICD-10-CM | POA: Diagnosis not present

## 2017-02-08 DIAGNOSIS — N2581 Secondary hyperparathyroidism of renal origin: Secondary | ICD-10-CM | POA: Diagnosis not present

## 2017-02-08 DIAGNOSIS — N186 End stage renal disease: Secondary | ICD-10-CM | POA: Diagnosis not present

## 2017-02-10 DIAGNOSIS — N186 End stage renal disease: Secondary | ICD-10-CM | POA: Diagnosis not present

## 2017-02-10 DIAGNOSIS — Z23 Encounter for immunization: Secondary | ICD-10-CM | POA: Diagnosis not present

## 2017-02-10 DIAGNOSIS — D631 Anemia in chronic kidney disease: Secondary | ICD-10-CM | POA: Diagnosis not present

## 2017-02-10 DIAGNOSIS — N2581 Secondary hyperparathyroidism of renal origin: Secondary | ICD-10-CM | POA: Diagnosis not present

## 2017-02-12 DIAGNOSIS — N186 End stage renal disease: Secondary | ICD-10-CM | POA: Diagnosis not present

## 2017-02-12 DIAGNOSIS — Z23 Encounter for immunization: Secondary | ICD-10-CM | POA: Diagnosis not present

## 2017-02-12 DIAGNOSIS — D631 Anemia in chronic kidney disease: Secondary | ICD-10-CM | POA: Diagnosis not present

## 2017-02-12 DIAGNOSIS — N2581 Secondary hyperparathyroidism of renal origin: Secondary | ICD-10-CM | POA: Diagnosis not present

## 2017-02-15 DIAGNOSIS — N186 End stage renal disease: Secondary | ICD-10-CM | POA: Diagnosis not present

## 2017-02-15 DIAGNOSIS — N2581 Secondary hyperparathyroidism of renal origin: Secondary | ICD-10-CM | POA: Diagnosis not present

## 2017-02-15 DIAGNOSIS — Z23 Encounter for immunization: Secondary | ICD-10-CM | POA: Diagnosis not present

## 2017-02-15 DIAGNOSIS — D631 Anemia in chronic kidney disease: Secondary | ICD-10-CM | POA: Diagnosis not present

## 2017-02-15 MED FILL — PANTOPRAZOLE SOD DR 20 MG T: 20 | 30 days supply | Qty: 60 | Fill #2

## 2017-02-17 DIAGNOSIS — N2581 Secondary hyperparathyroidism of renal origin: Secondary | ICD-10-CM | POA: Diagnosis not present

## 2017-02-17 DIAGNOSIS — D631 Anemia in chronic kidney disease: Secondary | ICD-10-CM | POA: Diagnosis not present

## 2017-02-17 DIAGNOSIS — N186 End stage renal disease: Secondary | ICD-10-CM | POA: Diagnosis not present

## 2017-02-17 DIAGNOSIS — Z23 Encounter for immunization: Secondary | ICD-10-CM | POA: Diagnosis not present

## 2017-02-19 DIAGNOSIS — D631 Anemia in chronic kidney disease: Secondary | ICD-10-CM | POA: Diagnosis not present

## 2017-02-19 DIAGNOSIS — Z23 Encounter for immunization: Secondary | ICD-10-CM | POA: Diagnosis not present

## 2017-02-19 DIAGNOSIS — N2581 Secondary hyperparathyroidism of renal origin: Secondary | ICD-10-CM | POA: Diagnosis not present

## 2017-02-19 DIAGNOSIS — N186 End stage renal disease: Secondary | ICD-10-CM | POA: Diagnosis not present

## 2017-02-22 DIAGNOSIS — N186 End stage renal disease: Secondary | ICD-10-CM | POA: Diagnosis not present

## 2017-02-22 DIAGNOSIS — D631 Anemia in chronic kidney disease: Secondary | ICD-10-CM | POA: Diagnosis not present

## 2017-02-22 DIAGNOSIS — N2581 Secondary hyperparathyroidism of renal origin: Secondary | ICD-10-CM | POA: Diagnosis not present

## 2017-02-22 DIAGNOSIS — Z23 Encounter for immunization: Secondary | ICD-10-CM | POA: Diagnosis not present

## 2017-02-24 DIAGNOSIS — N186 End stage renal disease: Secondary | ICD-10-CM | POA: Diagnosis not present

## 2017-02-24 DIAGNOSIS — D631 Anemia in chronic kidney disease: Secondary | ICD-10-CM | POA: Diagnosis not present

## 2017-02-24 DIAGNOSIS — Z23 Encounter for immunization: Secondary | ICD-10-CM | POA: Diagnosis not present

## 2017-02-24 DIAGNOSIS — N2581 Secondary hyperparathyroidism of renal origin: Secondary | ICD-10-CM | POA: Diagnosis not present

## 2017-02-26 DIAGNOSIS — N2581 Secondary hyperparathyroidism of renal origin: Secondary | ICD-10-CM | POA: Diagnosis not present

## 2017-02-26 DIAGNOSIS — Z23 Encounter for immunization: Secondary | ICD-10-CM | POA: Diagnosis not present

## 2017-02-26 DIAGNOSIS — D631 Anemia in chronic kidney disease: Secondary | ICD-10-CM | POA: Diagnosis not present

## 2017-02-26 DIAGNOSIS — N186 End stage renal disease: Secondary | ICD-10-CM | POA: Diagnosis not present

## 2017-02-27 DIAGNOSIS — Z992 Dependence on renal dialysis: Secondary | ICD-10-CM | POA: Diagnosis not present

## 2017-02-27 DIAGNOSIS — N186 End stage renal disease: Secondary | ICD-10-CM | POA: Diagnosis not present

## 2017-02-27 DIAGNOSIS — I120 Hypertensive chronic kidney disease with stage 5 chronic kidney disease or end stage renal disease: Secondary | ICD-10-CM | POA: Diagnosis not present

## 2017-03-03 ENCOUNTER — Encounter: Payer: Medicare Other | Admitting: Vascular Surgery

## 2017-03-09 ENCOUNTER — Encounter: Payer: Self-pay | Admitting: Vascular Surgery

## 2017-03-09 ENCOUNTER — Ambulatory Visit (INDEPENDENT_AMBULATORY_CARE_PROVIDER_SITE_OTHER): Payer: Self-pay | Admitting: Vascular Surgery

## 2017-03-09 VITALS — BP 131/87 | HR 76 | Temp 97.2°F | Resp 16 | Ht 67.0 in | Wt 131.0 lb

## 2017-03-09 DIAGNOSIS — Z48812 Encounter for surgical aftercare following surgery on the circulatory system: Secondary | ICD-10-CM

## 2017-03-09 NOTE — Progress Notes (Signed)
   Patient name: Karen Morrow MRN: [id number] DOB: March 01, 1961 Sex: female  REASON FOR VISIT:    Follow up after revision of right upper arm fistula.  HPI:   Karen Morrow is a pleasant 56 y.o. 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 She had 2 large aneurysms that were continuing to enlarge and were at risk for bleeding. At the time of surgery she was found to have multiple cystic areas that were quite thin and therefore I had to replace both aneurysms with an interposition 8 mm PTFE graft.  They have been using the graft above and below the revision without difficulty.  Current Outpatient Prescriptions  Medication Sig Dispense Refill  . albuterol (PROVENTIL HFA;VENTOLIN HFA) 108 (90 Base) MCG/ACT inhaler Inhale 2 puffs into the lungs every 6 (six) hours as needed for wheezing or shortness of breath. 1 Inhaler 0  . albuterol (PROVENTIL) (2.5 MG/3ML) 0.083% nebulizer solution Take 3 mLs (2.5 mg total) by nebulization every 6 (six) hours as needed for wheezing or shortness of breath. 150 mL 3  . amLODipine (NORVASC) 10 MG tablet Take 10 mg by mouth at bedtime.   6  . calcium acetate (PHOSLO) 667 MG capsule Take 2,001 mg by mouth 3 (three) times daily with meals.    . metoprolol tartrate (LOPRESSOR) 25 MG tablet Take 25 mg by mouth 2 (two) times daily.  6  . oxyCODONE-acetaminophen (PERCOCET/ROXICET) 5-325 MG tablet Take 1 tablet by mouth every 6 (six) hours as needed. 12 tablet 0  . pantoprazole (PROTONIX) 40 MG tablet Take 1 tablet (40 mg total) by mouth 2 (two) times daily. (Patient taking differently: Take 20 mg by mouth 2 (two) times daily. ) 60 tablet 1   No current facility-administered medications for this visit.     REVIEW OF SYSTEMS:  [X]  denotes positive finding, [ ]  denotes negative finding Cardiac  Comments:  Chest pain or chest pressure:    Shortness of breath upon exertion:    Short of breath when lying flat:    Irregular heart  rhythm:    Constitutional    Fever or chills:     PHYSICAL EXAM:   Vitals:   03/09/17 1011  BP: 131/87  Pulse: 76  Resp: 16  Temp: (!) 97.2 F (36.2 C)  TempSrc: Oral  SpO2: 99%  Weight: 131 lb (59.4 kg)  Height: 5\' 7"  (1.702 m)    GENERAL: The patient is a well-nourished female, in no acute distress. The vital signs are documented above. CARDIOVASCULAR: There is a regular rate and rhythm. PULMONARY: There is good air exchange bilaterally without wheezing or rales. Her graft has an excellent thrill area Her incisions are healing nicely.  DATA:   No new data.  MEDICAL ISSUES:   STATUS POST REVISION OF RIGHT UPPER ARM FISTULA: The patient is doing well status post revision of her fistula. I think it is safe at this point to cannulate between the 2 incisions. In one month they can cannulate at the incisions. They have been cannulating above and below the revision. I will see her back as needed.  Deitra Mayo [facility] [phone]

## 2017-03-21 MED FILL — PANTOPRAZOLE SOD DR 20 MG T: 20 | 30 days supply | Qty: 60 | Fill #3

## 2017-03-25 ENCOUNTER — Encounter (HOSPITAL_COMMUNITY): Payer: Self-pay | Admitting: Vascular Surgery

## 2017-03-25 NOTE — OR Nursing (Signed)
Addendum created to reflect correct Rec Care complete time

## 2017-03-30 DIAGNOSIS — Z992 Dependence on renal dialysis: Secondary | ICD-10-CM | POA: Diagnosis not present

## 2017-03-30 DIAGNOSIS — N186 End stage renal disease: Secondary | ICD-10-CM | POA: Diagnosis not present

## 2017-03-30 DIAGNOSIS — I120 Hypertensive chronic kidney disease with stage 5 chronic kidney disease or end stage renal disease: Secondary | ICD-10-CM | POA: Diagnosis not present

## 2017-04-25 MED FILL — PANTOPRAZOLE SOD DR 20 MG T: 20 | 30 days supply | Qty: 60 | Fill #4

## 2017-04-29 DIAGNOSIS — Z992 Dependence on renal dialysis: Secondary | ICD-10-CM | POA: Diagnosis not present

## 2017-04-29 DIAGNOSIS — N186 End stage renal disease: Secondary | ICD-10-CM | POA: Diagnosis not present

## 2017-04-29 DIAGNOSIS — I120 Hypertensive chronic kidney disease with stage 5 chronic kidney disease or end stage renal disease: Secondary | ICD-10-CM | POA: Diagnosis not present

## 2017-05-30 DIAGNOSIS — Z992 Dependence on renal dialysis: Secondary | ICD-10-CM | POA: Diagnosis not present

## 2017-05-30 DIAGNOSIS — N186 End stage renal disease: Secondary | ICD-10-CM | POA: Diagnosis not present

## 2017-05-30 DIAGNOSIS — I120 Hypertensive chronic kidney disease with stage 5 chronic kidney disease or end stage renal disease: Secondary | ICD-10-CM | POA: Diagnosis not present

## 2017-06-15 ENCOUNTER — Ambulatory Visit: Payer: Medicare Other

## 2017-06-16 ENCOUNTER — Ambulatory Visit: Payer: Medicare Other

## 2017-06-17 ENCOUNTER — Encounter: Payer: Self-pay | Admitting: Family Medicine

## 2017-06-17 ENCOUNTER — Ambulatory Visit: Payer: Self-pay | Attending: Family Medicine | Admitting: Family Medicine

## 2017-06-17 ENCOUNTER — Other Ambulatory Visit: Payer: Self-pay

## 2017-06-17 VITALS — BP 102/73 | HR 70 | Temp 98.1°F | Resp 20 | Wt 142.4 lb

## 2017-06-17 DIAGNOSIS — I10 Essential (primary) hypertension: Secondary | ICD-10-CM

## 2017-06-17 DIAGNOSIS — R0600 Dyspnea, unspecified: Secondary | ICD-10-CM

## 2017-06-17 DIAGNOSIS — R079 Chest pain, unspecified: Secondary | ICD-10-CM | POA: Insufficient documentation

## 2017-06-17 DIAGNOSIS — Z13220 Encounter for screening for lipoid disorders: Secondary | ICD-10-CM

## 2017-06-17 DIAGNOSIS — N186 End stage renal disease: Secondary | ICD-10-CM | POA: Insufficient documentation

## 2017-06-17 DIAGNOSIS — Z760 Encounter for issue of repeat prescription: Secondary | ICD-10-CM

## 2017-06-17 DIAGNOSIS — F17200 Nicotine dependence, unspecified, uncomplicated: Secondary | ICD-10-CM

## 2017-06-17 DIAGNOSIS — Z87898 Personal history of other specified conditions: Secondary | ICD-10-CM

## 2017-06-17 DIAGNOSIS — R0609 Other forms of dyspnea: Secondary | ICD-10-CM

## 2017-06-17 DIAGNOSIS — F17210 Nicotine dependence, cigarettes, uncomplicated: Secondary | ICD-10-CM | POA: Insufficient documentation

## 2017-06-17 DIAGNOSIS — Z79899 Other long term (current) drug therapy: Secondary | ICD-10-CM | POA: Insufficient documentation

## 2017-06-17 DIAGNOSIS — Z992 Dependence on renal dialysis: Secondary | ICD-10-CM | POA: Insufficient documentation

## 2017-06-17 DIAGNOSIS — K219 Gastro-esophageal reflux disease without esophagitis: Secondary | ICD-10-CM

## 2017-06-17 DIAGNOSIS — I120 Hypertensive chronic kidney disease with stage 5 chronic kidney disease or end stage renal disease: Secondary | ICD-10-CM | POA: Insufficient documentation

## 2017-06-17 MED ORDER — AMLODIPINE BESYLATE 10 MG PO TABS: 10.0000 mg | ORAL_TABLET | Freq: Every day | ORAL | 5 refills | Status: DC

## 2017-06-17 MED ORDER — NICOTINE POLACRILEX 4 MG MT GUM: 4.0000 mg | CHEWING_GUM | OROMUCOSAL | 1 refills | Status: DC | PRN

## 2017-06-17 MED ORDER — PANTOPRAZOLE SODIUM 20 MG PO TBEC: 20.0000 mg | DELAYED_RELEASE_TABLET | Freq: Two times a day (BID) | ORAL | 2 refills | Status: DC

## 2017-06-17 MED ORDER — METOPROLOL TARTRATE 25 MG PO TABS: 25.0000 mg | ORAL_TABLET | Freq: Two times a day (BID) | ORAL | 5 refills | Status: DC

## 2017-06-17 MED FILL — PANTOPRAZOLE SOD DR 20 MG T: 20 | 30 days supply | Qty: 60 | Fill #0

## 2017-06-17 MED FILL — AMLODIPINE BESYLATE 10 MG T: 10 | 30 days supply | Qty: 30 | Fill #0

## 2017-06-17 NOTE — Patient Instructions (Addendum)
Managing Your Hypertension Hypertension is commonly called high blood pressure. This is when the force of your blood pressing against the walls of your arteries is too strong. Arteries are blood vessels that carry blood from your heart throughout your body. Hypertension forces the heart to work harder to pump blood, and may cause the arteries to become narrow or stiff. Having untreated or uncontrolled hypertension can cause heart attack, stroke, kidney disease, and other problems. What are blood pressure readings? A blood pressure reading consists of a higher number over a lower number. Ideally, your blood pressure should be below 120/80. The first ("top") number is called the systolic pressure. It is a measure of the pressure in your arteries as your heart beats. The second ("bottom") number is called the diastolic pressure. It is a measure of the pressure in your arteries as the heart relaxes. What does my blood pressure reading mean? Blood pressure is classified into four stages. Based on your blood pressure reading, your health care provider may use the following stages to determine what type of treatment you need, if any. Systolic pressure and diastolic pressure are measured in a unit called mm Hg. Normal  Systolic pressure: below 120.  Diastolic pressure: below 80. Elevated  Systolic pressure: 120-129.  Diastolic pressure: below 80. Hypertension stage 1  Systolic pressure: 130-139.  Diastolic pressure: 80-89. Hypertension stage 2  Systolic pressure: 140 or above.  Diastolic pressure: 90 or above. What health risks are associated with hypertension? Managing your hypertension is an important responsibility. Uncontrolled hypertension can lead to:  A heart attack.  A stroke.  A weakened blood vessel (aneurysm).  Heart failure.  Kidney damage.  Eye damage.  Metabolic syndrome.  Memory and concentration problems.  What changes can I make to manage my  hypertension? Hypertension can be managed by making lifestyle changes and possibly by taking medicines. Your health care provider will help you make a plan to bring your blood pressure within a normal range. Eating and drinking  Eat a diet that is high in fiber and potassium, and low in salt (sodium), added sugar, and fat. An example eating plan is called the DASH (Dietary Approaches to Stop Hypertension) diet. To eat this way: ? Eat plenty of fresh fruits and vegetables. Try to fill half of your plate at each meal with fruits and vegetables. ? Eat whole grains, such as whole wheat pasta, brown rice, or whole grain bread. Fill about one quarter of your plate with whole grains. ? Eat low-fat diary products. ? Avoid fatty cuts of meat, processed or cured meats, and poultry with skin. Fill about one quarter of your plate with lean proteins such as fish, chicken without skin, beans, eggs, and tofu. ? Avoid premade and processed foods. These tend to be higher in sodium, added sugar, and fat.  Reduce your daily sodium intake. Most people with hypertension should eat less than 1,500 mg of sodium a day.  Limit alcohol intake to no more than 1 drink a day for nonpregnant women and 2 drinks a day for men. One drink equals 12 oz of beer, 5 oz of wine, or 1 oz of hard liquor. Lifestyle  Work with your health care provider to maintain a healthy body weight, or to lose weight. Ask what an ideal weight is for you.  Get at least 30 minutes of exercise that causes your heart to beat faster (aerobic exercise) most days of the week. Activities may include walking, swimming, or biking.  Include exercise   to strengthen your muscles (resistance exercise), such as weight lifting, as part of your weekly exercise routine. Try to do these types of exercises for 30 minutes at least 3 days a week.  Do not use any products that contain nicotine or tobacco, such as cigarettes and e-cigarettes. If you need help quitting, ask  your health care provider.  Control any long-term (chronic) conditions you have, such as high cholesterol or diabetes. Monitoring  Monitor your blood pressure at home as told by your health care provider. Your personal target blood pressure may vary depending on your medical conditions, your age, and other factors.  Have your blood pressure checked regularly, as often as told by your health care provider. Working with your health care provider  Review all the medicines you take with your health care provider because there may be side effects or interactions.  Talk with your health care provider about your diet, exercise habits, and other lifestyle factors that may be contributing to hypertension.  Visit your health care provider regularly. Your health care provider can help you create and adjust your plan for managing hypertension. Will I need medicine to control my blood pressure? Your health care provider may prescribe medicine if lifestyle changes are not enough to get your blood pressure under control, and if:  Your systolic blood pressure is 130 or higher.  Your diastolic blood pressure is 80 or higher.  Take medicines only as told by your health care provider. Follow the directions carefully. Blood pressure medicines must be taken as prescribed. The medicine does not work as well when you skip doses. Skipping doses also puts you at risk for problems. Contact a health care provider if:  You think you are having a reaction to medicines you have taken.  You have repeated (recurrent) headaches.  You feel dizzy.  You have swelling in your ankles.  You have trouble with your vision. Get help right away if:  You develop a severe headache or confusion.  You have unusual weakness or numbness, or you feel faint.  You have severe pain in your chest or abdomen.  You vomit repeatedly.  You have trouble breathing. Summary  Hypertension is when the force of blood pumping through  your arteries is too strong. If this condition is not controlled, it may put you at risk for serious complications.  Your personal target blood pressure may vary depending on your medical conditions, your age, and other factors. For most people, a normal blood pressure is less than 120/80.  Hypertension is managed by lifestyle changes, medicines, or both. Lifestyle changes include weight loss, eating a healthy, low-sodium diet, exercising more, and limiting alcohol. This information is not intended to replace advice given to you by your health care provider. Make sure you discuss any questions you have with your health care provider. Document Released: 02/09/2012 Document Revised: 04/14/2016 Document Reviewed: 04/14/2016 Elsevier Interactive Patient Education  2018 [employer] Inc[employer]  

## 2017-06-17 NOTE — Progress Notes (Signed)
Subjective:  Patient ID: Karen Morrow, female    DOB: Jul 29, 1960  Age: 57 y.o. MRN: [id number]  CC: Medication Refill   HPI Karen Morrow presents to establish care and for medication refills. Past medical history significant for hypertension, end-stage renal disease on dialysis, and GERD.  Hypertension: She is not exercising and is adherent to low salt diet.  She does not check BP at home.  She does report episode of hypotension after hemodialysis. She does report occasional chest pain and dyspnea with activity, she reports having a sit down when symptoms occur.  She is unsure of symptom onset but reports symptoms have been present for a while. Patient denies chest pressure/discomfort, claudication, dyspnea, lower extremity edema, near-syncope, palpitations and syncope.  Cardiovascular risk factors: dyslipidemia, hypertension, sedentary lifestyle and smoking/ tobacco exposure.  Reports smoking since the age of 51 she reports smoking almost half a pack of cigarettes per day.  She is ready to quit at this time.  Use of agents associated with hypertension: none. History of target organ damage: chronic kidney disease.  History of GERD: Symptoms include heartburn and nausea.  . She denies dysphagia.  She has not lost weight. She denies melena, hematochezia, hematemesis, and coffee ground emesis. Medical therapy in the past has included proton pump inhibitors, Protonix she is requesting refill.   Outpatient Medications Prior to Visit  Medication Sig Dispense Refill  . albuterol (PROVENTIL HFA;VENTOLIN HFA) 108 (90 Base) MCG/ACT inhaler Inhale 2 puffs into the lungs every 6 (six) hours as needed for wheezing or shortness of breath. 1 Inhaler 0  . albuterol (PROVENTIL) (2.5 MG/3ML) 0.083% nebulizer solution Take 3 mLs (2.5 mg total) by nebulization every 6 (six) hours as needed for wheezing or shortness of breath. 150 mL 3  . calcium acetate (PHOSLO) 667 MG capsule Take 2,001 mg by mouth 3 (three)  times daily with meals.    Marland Kitchen amLODipine (NORVASC) 10 MG tablet Take 10 mg by mouth at bedtime.   6  . metoprolol tartrate (LOPRESSOR) 25 MG tablet Take 25 mg by mouth 2 (two) times daily.  6  . oxyCODONE-acetaminophen (PERCOCET/ROXICET) 5-325 MG tablet Take 1 tablet by mouth every 6 (six) hours as needed. (Patient not taking: Reported on 06/17/2017) 12 tablet 0  . pantoprazole (PROTONIX) 40 MG tablet Take 1 tablet (40 mg total) by mouth 2 (two) times daily. (Patient taking differently: Take 20 mg by mouth 2 (two) times daily. ) 60 tablet 1   No facility-administered medications prior to visit.     ROS Review of Systems  Constitutional: Negative.   HENT: Negative.   Eyes: Negative for visual disturbance.  Respiratory: Positive for shortness of breath (history of DOE).   Cardiovascular: Positive for chest pain (history of chest pain).  Gastrointestinal: Negative.   Skin: Negative.   Psychiatric/Behavioral: Negative.  Negative for suicidal ideas.   Objective:  BP 102/73 (BP Location: Left Arm, Patient Position: Sitting, Cuff Size: Normal)   Pulse 70   Temp 98.1 F (36.7 C) (Oral)   Resp 20   Wt 142 lb 6.4 oz (64.6 kg)   SpO2 100%   BMI 22.30 kg/m   BP/Weight 06/17/2017 03/09/2017 7/82/9562  Systolic BP 130 865 99  Diastolic BP 73 87 77  Wt. (Lbs) 142.4 131 127  BMI 22.3 20.52 19.89    Physical Exam  Constitutional: She is oriented to person, place, and time. She appears well-developed and well-nourished.  HENT:  Head: Normocephalic and atraumatic.  Right  Ear: External ear normal.  Left Ear: External ear normal.  Nose: Nose normal.  Mouth/Throat: Oropharynx is clear and moist.  Eyes: Conjunctivae are normal. Pupils are equal, round, and reactive to light.  Neck: Normal range of motion. Neck supple. No JVD present.  Cardiovascular: Normal rate, regular rhythm, normal heart sounds and intact distal pulses.  Pulmonary/Chest: Effort normal and breath sounds normal.  Abdominal:  Soft. Bowel sounds are normal. There is no tenderness.  Neurological: She is alert and oriented to person, place, and time.  Skin: Skin is warm and dry.  Psychiatric: She has a normal mood and affect.  Nursing note and vitals reviewed.    Assessment & Plan:   1. Essential hypertension Controlled on current medications. - amLODipine (NORVASC) 10 MG tablet; Take 1 tablet (10 mg total) by mouth at bedtime.  Dispense: 30 tablet; Refill: 5 - metoprolol tartrate (LOPRESSOR) 25 MG tablet; Take 1 tablet (25 mg total) by mouth 2 (two) times daily.  Dispense: 60 tablet; Refill: 5 - Ambulatory referral to Cardiology  2. History of exertional chest pain  - EKG 12-Lead - Ambulatory referral to Cardiology  3. Gastroesophageal reflux disease, esophagitis presence not specified  - pantoprazole (PROTONIX) 20 MG tablet; Take 1 tablet (20 mg total) by mouth 2 (two) times daily before a meal.  Dispense: 60 tablet; Refill: 2  4. DOE (dyspnea on exertion)  - EKG 12-Lead - Ambulatory referral to Cardiology  5. Current every day smoker  - nicotine polacrilex (NICORETTE) 4 MG gum; Take 1 each (4 mg total) by mouth as needed for smoking cessation.  Dispense: 100 tablet; Refill: 1  6. Medication refill  - amLODipine (NORVASC) 10 MG tablet; Take 1 tablet (10 mg total) by mouth at bedtime.  Dispense: 30 tablet; Refill: 5 - metoprolol tartrate (LOPRESSOR) 25 MG tablet; Take 1 tablet (25 mg total) by mouth 2 (two) times daily.  Dispense: 60 tablet; Refill: 5  7. Screening cholesterol level  - Lipid Panel - CMP and Liver      Follow-up: Return in about 3 months (around 09/15/2017) for HTN.   Alfonse Spruce FNP

## 2017-06-17 NOTE — Progress Notes (Signed)
Medication refill: Protonix

## 2017-06-18 LAB — CMP AND LIVER
ALT: 29 IU/L (ref 0–32)
AST: 37 IU/L (ref 0–40)
Albumin: 4.4 g/dL (ref 3.5–5.5)
Alkaline Phosphatase: 103 IU/L (ref 39–117)
BUN: 25 mg/dL — ABNORMAL HIGH (ref 6–24)
Bilirubin Total: 0.3 mg/dL (ref 0.0–1.2)
Bilirubin, Direct: 0.15 mg/dL (ref 0.00–0.40)
CO2: 20 mmol/L (ref 20–29)
Calcium: 9.9 mg/dL (ref 8.7–10.2)
Chloride: 90 mmol/L — ABNORMAL LOW (ref 96–106)
Creatinine, Ser: 6.6 mg/dL — ABNORMAL HIGH (ref 0.57–1.00)
GFR calc Af Amer: 7 mL/min/{1.73_m2} — ABNORMAL LOW (ref >59)
GFR calc non Af Amer: 6 mL/min/{1.73_m2} — ABNORMAL LOW (ref >59)
Glucose: 88 mg/dL (ref 65–99)
Potassium: 4.4 mmol/L (ref 3.5–5.2)
Sodium: 134 mmol/L (ref 134–144)
Total Protein: 7.7 g/dL (ref 6.0–8.5)

## 2017-06-18 LAB — LIPID PANEL
Chol/HDL Ratio: 2.4 ratio (ref 0.0–4.4)
Cholesterol, Total: 120 mg/dL (ref 100–199)
HDL: 50 mg/dL (ref >39)
LDL Calculated: 52 mg/dL (ref 0–99)
Triglycerides: 91 mg/dL (ref 0–149)
VLDL Cholesterol Cal: 18 mg/dL (ref 5–40)

## 2017-06-21 ENCOUNTER — Other Ambulatory Visit: Payer: Self-pay | Admitting: Family Medicine

## 2017-06-28 ENCOUNTER — Telehealth (INDEPENDENT_AMBULATORY_CARE_PROVIDER_SITE_OTHER): Payer: Self-pay | Admitting: *Deleted

## 2017-06-28 NOTE — Telephone Encounter (Signed)
Patient verified DOB Patient is aware of cholesterol and liver being normal. Patient is also aware of kidney findings being consistent with CKD. No further questions.

## 2017-06-28 NOTE — Telephone Encounter (Signed)
-----   Message from Alfonse Spruce, [place] sent at 06/22/2017 11:33 AM EST ----- Cholesterol levels are normal. Liver function normal. Kidney function shows findings consistent with history of  CKD.

## 2017-06-29 ENCOUNTER — Encounter: Payer: Self-pay | Admitting: Cardiovascular Disease

## 2017-06-29 ENCOUNTER — Ambulatory Visit (INDEPENDENT_AMBULATORY_CARE_PROVIDER_SITE_OTHER): Payer: Self-pay | Admitting: Cardiovascular Disease

## 2017-06-29 VITALS — BP 110/68 | HR 82 | Ht 67.0 in | Wt 145.0 lb

## 2017-06-29 DIAGNOSIS — R0609 Other forms of dyspnea: Secondary | ICD-10-CM

## 2017-06-29 DIAGNOSIS — R0600 Dyspnea, unspecified: Secondary | ICD-10-CM

## 2017-06-29 DIAGNOSIS — R079 Chest pain, unspecified: Secondary | ICD-10-CM

## 2017-06-29 NOTE — Patient Instructions (Signed)
Medication Instructions: Your physician recommends that you continue on your current medications as directed. Please refer to the Current Medication list given to you today.   Testing/Procedures: Your physician has requested that you have a lexiscan myoview. For further information please visit [email or web address]. Please follow instruction sheet, as given.  Follow-Up: Your physician wants you to follow-up in: 6 months with Dr. Gwenlyn Found unless abnormal results. You will receive a reminder letter in the mail two months in advance. If you don't receive a letter, please call our office to schedule the follow-up appointment.  If you need a refill on your cardiac medications before your next appointment, please call your pharmacy.

## 2017-06-29 NOTE — Progress Notes (Signed)
06/29/2017 Karen Morrow   August 12, 1960  [id number]  Primary Physician Alfonse Spruce, FNP Primary Cardiologist: Lorretta Harp MD Lupe Carney, [place]  HPI:  Karen Morrow is a 57 y.o. thin-appearing married African-American female mother of 2, grandmother 2 grandchildren who is referred by Dr. Braulio Conte for cardiovascular evaluation because of new onset chest pain. Her risk factor profile is notable for continued tobacco abuse of one half pack per day for last 25 years as well as treated hypertension. She does have a family history with father who died of a myocardial infarction in his 2s. She has never had a heart attack or stroke. She has been on hemodialysis for last 4 years. Last several weeks she's noticed substernal chest pain at the termination of dialysis lasting several minutes at a time. She did have a 2-D echocardiogram performed 11/08/16 revealing normal LV systolic function, grade 2 diastolic dysfunction with a bicuspid aortic valve, moderate AI and severe pulmonary hypertension.   Current Meds  Medication Sig  . albuterol (PROVENTIL HFA;VENTOLIN HFA) 108 (90 Base) MCG/ACT inhaler Inhale 2 puffs into the lungs every 6 (six) hours as needed for wheezing or shortness of breath.  Marland Kitchen albuterol (PROVENTIL) (2.5 MG/3ML) 0.083% nebulizer solution Take 3 mLs (2.5 mg total) by nebulization every 6 (six) hours as needed for wheezing or shortness of breath.  Marland Kitchen amLODipine (NORVASC) 10 MG tablet Take 1 tablet (10 mg total) by mouth at bedtime.  . calcium acetate (PHOSLO) 667 MG capsule Take 2,001 mg by mouth 3 (three) times daily with meals.  . metoprolol tartrate (LOPRESSOR) 25 MG tablet Take 1 tablet (25 mg total) by mouth 2 (two) times daily.  . nicotine polacrilex (NICORETTE) 4 MG gum Take 1 each (4 mg total) by mouth as needed for smoking cessation.  . pantoprazole (PROTONIX) 20 MG tablet Take 1 tablet (20 mg total) by mouth 2 (two) times daily before a meal.      Allergies  Allergen Reactions  . Cefazolin Other (See Comments)    Severe thrombocytopenia    Social History   Socioeconomic History  . Marital status: Married    Spouse name: Not on file  . Number of children: Not on file  . Years of education: Not on file  . Highest education level: Not on file  Social Needs  . Financial resource strain: Not on file  . Food insecurity - worry: Not on file  . Food insecurity - inability: Not on file  . Transportation needs - medical: Not on file  . Transportation needs - non-medical: Not on file  Occupational History  . Not on file  Tobacco Use  . Smoking status: Light Tobacco Smoker    Packs/day: 0.50    Years: 20.00    Pack years: 10.00    Types: Cigarettes  . Smokeless tobacco: Never Used  Substance and Sexual Activity  . Alcohol use: Yes    Alcohol/week: 0.6 oz    Types: 1 Glasses of wine per week    Comment: 09/07/16-pt reports less than 1 glass of wine/week. 11/03/2012 "1-2 40oz beers/day maybe"   11/17/15 "I don;t drink beer anymore since Ive been on dialysis". p  . Drug use: Yes    Types: Marijuana    Comment: 11/03/2012 "maybe once or twice/wk" 11/17/15 "I haven't smoked in 3-4 months"  . Sexual activity: Not Currently  Other Topics Concern  . Not on file  Social History Narrative  . Not on file  Review of Systems: General: negative for chills, fever, night sweats or weight changes.  Cardiovascular: negative for chest pain, dyspnea on exertion, edema, orthopnea, palpitations, paroxysmal nocturnal dyspnea or shortness of breath Dermatological: negative for rash Respiratory: negative for cough or wheezing Urologic: negative for hematuria Abdominal: negative for nausea, vomiting, diarrhea, bright red blood per rectum, melena, or hematemesis Neurologic: negative for visual changes, syncope, or dizziness All other systems reviewed and are otherwise negative except as noted above.    Blood pressure 110/68, pulse 82, height  5\' 7"  (1.702 m), weight 145 lb (65.8 kg).  General appearance: alert and no distress Neck: no adenopathy, no carotid bruit, no JVD, supple, symmetrical, trachea midline and thyroid not enlarged, symmetric, no tenderness/mass/nodules Lungs: clear to auscultation bilaterally Heart: regular rate and rhythm, S1, S2 normal, no murmur, click, rub or gallop Extremities: extremities normal, atraumatic, no cyanosis or edema Pulses: 2+ and symmetric Skin: Skin color, texture, turgor normal. No rashes or lesions Neurologic: Alert and oriented X 3, normal strength and tone. Normal symmetric reflexes. Normal coordination and gait  EKG not performed today. EKG performed 06/17/17 revealed sinus rhythm, left ventricular hypertrophy with repolarization changes. I personally reviewed that EKG.  ASSESSMENT AND PLAN:   HTN (hypertension) History of essential hypertension blood pressure measures at 110/68. She is on amlodipine and metoprolol. Continue current meds at current dosing.  Chest pain [name] was referred to me by Dr. Braulio Conte for new onset chest pain. She has risk factors including hypertension and family history. Her father died of a myocardial infarction in his 71s. She does smoke one half pack per day for last 25 years but is trying to quit. She has been on hemodialysis for the last 4 years. For the last several weeks she's had to 3 episodes of substernal chest pain at the termination of dialysis lasting several minutes at a time. These have not occurred at home. I'm going to get a pharmacologic Myoview stress test to further evaluate.      Lorretta Harp MD FACP,FACC,FAHA, [facility] 06/29/2017 9:54 AM

## 2017-06-29 NOTE — Assessment & Plan Note (Signed)
Karen Morrow was referred to me by Dr. Braulio Conte for new onset chest pain. She has risk factors including hypertension and family history. Her father died of a myocardial infarction in his 2s. She does smoke one half pack per day for last 25 years but is trying to quit. She has been on hemodialysis for the last 4 years. For the last several weeks she's had to 3 episodes of substernal chest pain at the termination of dialysis lasting several minutes at a time. These have not occurred at home. I'm going to get a pharmacologic Myoview stress test to further evaluate.

## 2017-06-29 NOTE — Assessment & Plan Note (Signed)
History of essential hypertension blood pressure measures at 110/68. She is on amlodipine and metoprolol. Continue current meds at current dosing.

## 2017-06-30 DIAGNOSIS — Z992 Dependence on renal dialysis: Secondary | ICD-10-CM | POA: Diagnosis not present

## 2017-06-30 DIAGNOSIS — I120 Hypertensive chronic kidney disease with stage 5 chronic kidney disease or end stage renal disease: Secondary | ICD-10-CM | POA: Diagnosis not present

## 2017-06-30 DIAGNOSIS — N186 End stage renal disease: Secondary | ICD-10-CM | POA: Diagnosis not present

## 2017-07-01 ENCOUNTER — Telehealth (HOSPITAL_COMMUNITY): Payer: Self-pay

## 2017-07-01 DIAGNOSIS — N186 End stage renal disease: Secondary | ICD-10-CM | POA: Diagnosis not present

## 2017-07-01 DIAGNOSIS — I120 Hypertensive chronic kidney disease with stage 5 chronic kidney disease or end stage renal disease: Secondary | ICD-10-CM | POA: Diagnosis not present

## 2017-07-01 DIAGNOSIS — Z992 Dependence on renal dialysis: Secondary | ICD-10-CM | POA: Diagnosis not present

## 2017-07-01 NOTE — Telephone Encounter (Signed)
Encounter complete. 

## 2017-07-06 ENCOUNTER — Inpatient Hospital Stay (HOSPITAL_COMMUNITY)
Admission: RE | Admit: 2017-07-06 | Payer: Medicare Other | Source: Ambulatory Visit | Attending: Cardiovascular Disease | Admitting: Cardiovascular Disease

## 2017-07-08 ENCOUNTER — Telehealth (HOSPITAL_COMMUNITY): Payer: Self-pay

## 2017-07-08 NOTE — Telephone Encounter (Signed)
Encounter complete. 

## 2017-07-13 ENCOUNTER — Ambulatory Visit (HOSPITAL_COMMUNITY)
Admission: RE | Admit: 2017-07-13 | Payer: Self-pay | Source: Ambulatory Visit | Attending: Cardiovascular Disease | Admitting: Cardiovascular Disease

## 2017-07-15 ENCOUNTER — Ambulatory Visit: Payer: Medicare Other | Admitting: Internal Medicine

## 2017-07-27 ENCOUNTER — Telehealth (HOSPITAL_COMMUNITY): Payer: Self-pay

## 2017-07-27 NOTE — Telephone Encounter (Signed)
Encounter complete. 

## 2017-07-29 ENCOUNTER — Ambulatory Visit (HOSPITAL_COMMUNITY)
Admission: RE | Admit: 2017-07-29 | Payer: Medicare Other | Source: Ambulatory Visit | Attending: Cardiovascular Disease | Admitting: Cardiovascular Disease

## 2017-07-29 DIAGNOSIS — Z992 Dependence on renal dialysis: Secondary | ICD-10-CM | POA: Diagnosis not present

## 2017-07-29 DIAGNOSIS — I120 Hypertensive chronic kidney disease with stage 5 chronic kidney disease or end stage renal disease: Secondary | ICD-10-CM | POA: Diagnosis not present

## 2017-07-29 DIAGNOSIS — N186 End stage renal disease: Secondary | ICD-10-CM | POA: Diagnosis not present

## 2017-07-29 MED FILL — PANTOPRAZOLE SOD DR 20 MG T: 20 | 30 days supply | Qty: 60 | Fill #1

## 2017-08-29 DIAGNOSIS — N186 End stage renal disease: Secondary | ICD-10-CM | POA: Diagnosis not present

## 2017-08-29 DIAGNOSIS — I120 Hypertensive chronic kidney disease with stage 5 chronic kidney disease or end stage renal disease: Secondary | ICD-10-CM | POA: Diagnosis not present

## 2017-08-29 DIAGNOSIS — Z992 Dependence on renal dialysis: Secondary | ICD-10-CM | POA: Diagnosis not present

## 2017-08-30 ENCOUNTER — Telehealth (HOSPITAL_COMMUNITY): Payer: Self-pay | Admitting: Cardiovascular Disease

## 2017-08-30 NOTE — Telephone Encounter (Signed)
Pt cancelled appt on 07/06/17 Pt no-showed on 07/29/17, 07/13/17. Called pt and lmsg on 08/04/17 to CB to r/s .Marland KitchenRG  Patient will be removed from the workqueue.

## 2017-09-01 ENCOUNTER — Telehealth: Payer: Self-pay | Admitting: Cardiovascular Disease

## 2017-09-01 NOTE — Telephone Encounter (Signed)
Received records from [place] on 08/31/17, Appt 09/02/17 @ 10:45. NV

## 2017-09-02 ENCOUNTER — Ambulatory Visit: Payer: Self-pay | Admitting: Cardiovascular Disease

## 2017-09-07 MED FILL — PANTOPRAZOLE SOD DR 20 MG T: 20 | 30 days supply | Qty: 60 | Fill #2

## 2017-09-28 DIAGNOSIS — I120 Hypertensive chronic kidney disease with stage 5 chronic kidney disease or end stage renal disease: Secondary | ICD-10-CM | POA: Diagnosis not present

## 2017-09-28 DIAGNOSIS — N186 End stage renal disease: Secondary | ICD-10-CM | POA: Diagnosis not present

## 2017-09-28 DIAGNOSIS — Z992 Dependence on renal dialysis: Secondary | ICD-10-CM | POA: Diagnosis not present

## 2017-10-05 ENCOUNTER — Ambulatory Visit: Payer: Self-pay | Admitting: Cardiovascular Disease

## 2017-10-06 ENCOUNTER — Encounter: Payer: Self-pay | Admitting: *Deleted

## 2017-10-19 ENCOUNTER — Other Ambulatory Visit: Payer: Self-pay | Admitting: Family Medicine

## 2017-10-19 DIAGNOSIS — K219 Gastro-esophageal reflux disease without esophagitis: Secondary | ICD-10-CM

## 2017-10-19 MED ORDER — PANTOPRAZOLE SODIUM 20 MG PO TBEC: 20.0000 mg | DELAYED_RELEASE_TABLET | Freq: Two times a day (BID) | ORAL | 2 refills | Status: DC

## 2017-10-19 NOTE — Telephone Encounter (Signed)
Patient called requesting a refill on pantoprazole (PROTONIX) 20 MG tablet  Patient stated she is on dialysis and needs the medication. Patient has scheduled an appt. For 6/21 to est care. Please f/u

## 2017-10-20 ENCOUNTER — Other Ambulatory Visit: Payer: Self-pay

## 2017-10-20 DIAGNOSIS — K219 Gastro-esophageal reflux disease without esophagitis: Secondary | ICD-10-CM

## 2017-10-20 MED ORDER — PANTOPRAZOLE SODIUM 20 MG PO TBEC: 20.0000 mg | DELAYED_RELEASE_TABLET | Freq: Two times a day (BID) | ORAL | 0 refills | Status: DC

## 2017-10-20 MED FILL — PANTOPRAZOLE SOD DR 20 MG T: 20 | 30 days supply | Qty: 60 | Fill #0

## 2017-10-29 DIAGNOSIS — N186 End stage renal disease: Secondary | ICD-10-CM | POA: Diagnosis not present

## 2017-10-29 DIAGNOSIS — I120 Hypertensive chronic kidney disease with stage 5 chronic kidney disease or end stage renal disease: Secondary | ICD-10-CM | POA: Diagnosis not present

## 2017-10-29 DIAGNOSIS — Z992 Dependence on renal dialysis: Secondary | ICD-10-CM | POA: Diagnosis not present

## 2017-11-18 ENCOUNTER — Other Ambulatory Visit: Payer: Self-pay | Admitting: Internal Medicine

## 2017-11-18 ENCOUNTER — Encounter: Payer: Self-pay | Admitting: Internal Medicine

## 2017-11-18 ENCOUNTER — Ambulatory Visit (HOSPITAL_BASED_OUTPATIENT_CLINIC_OR_DEPARTMENT_OTHER): Payer: Self-pay | Admitting: Internal Medicine

## 2017-11-18 VITALS — BP 122/79 | HR 81 | Temp 98.6°F | Ht 67.0 in | Wt 147.2 lb

## 2017-11-18 DIAGNOSIS — K219 Gastro-esophageal reflux disease without esophagitis: Secondary | ICD-10-CM

## 2017-11-18 DIAGNOSIS — Z5321 Procedure and treatment not carried out due to patient leaving prior to being seen by health care provider: Secondary | ICD-10-CM

## 2017-11-20 NOTE — Progress Notes (Signed)
Pt left before being seen

## 2017-11-22 MED FILL — PANTOPRAZOLE SOD DR 20 MG T: 20 | 30 days supply | Qty: 60 | Fill #0

## 2017-11-23 ENCOUNTER — Ambulatory Visit: Payer: Self-pay | Attending: Internal Medicine | Admitting: Physician Assistant

## 2017-11-23 VITALS — BP 151/86 | HR 81 | Temp 98.0°F | Resp 18 | Ht 67.0 in | Wt 147.0 lb

## 2017-11-23 DIAGNOSIS — I10 Essential (primary) hypertension: Secondary | ICD-10-CM

## 2017-11-23 DIAGNOSIS — Z992 Dependence on renal dialysis: Secondary | ICD-10-CM | POA: Insufficient documentation

## 2017-11-23 DIAGNOSIS — Z760 Encounter for issue of repeat prescription: Secondary | ICD-10-CM | POA: Insufficient documentation

## 2017-11-23 DIAGNOSIS — N189 Chronic kidney disease, unspecified: Secondary | ICD-10-CM | POA: Insufficient documentation

## 2017-11-23 DIAGNOSIS — J45909 Unspecified asthma, uncomplicated: Secondary | ICD-10-CM | POA: Insufficient documentation

## 2017-11-23 DIAGNOSIS — I129 Hypertensive chronic kidney disease with stage 1 through stage 4 chronic kidney disease, or unspecified chronic kidney disease: Secondary | ICD-10-CM | POA: Insufficient documentation

## 2017-11-23 DIAGNOSIS — Z79899 Other long term (current) drug therapy: Secondary | ICD-10-CM | POA: Insufficient documentation

## 2017-11-23 DIAGNOSIS — K219 Gastro-esophageal reflux disease without esophagitis: Secondary | ICD-10-CM | POA: Insufficient documentation

## 2017-11-23 MED ORDER — METOPROLOL TARTRATE 25 MG PO TABS: 25.0000 mg | ORAL_TABLET | Freq: Two times a day (BID) | ORAL | 5 refills | Status: DC

## 2017-11-23 MED ORDER — PANTOPRAZOLE SODIUM 20 MG PO TBEC: 20.0000 mg | DELAYED_RELEASE_TABLET | Freq: Two times a day (BID) | ORAL | 5 refills | Status: DC

## 2017-11-23 MED ORDER — AMLODIPINE BESYLATE 10 MG PO TABS: 10.0000 mg | ORAL_TABLET | Freq: Every day | ORAL | 5 refills | Status: DC

## 2017-11-23 MED FILL — AMLODIPINE BESYLATE 10 MG T: 10 | 30 days supply | Qty: 30 | Fill #0

## 2017-11-23 MED FILL — METOPROLOL TARTRATE 25 MG T: 25 | 30 days supply | Qty: 60 | Fill #0

## 2017-11-23 NOTE — Progress Notes (Signed)
Patient ID: Karen Morrow, female   DOB: [birth year], 57 y.o.   MRN: [id number]   Karen Morrow, is a 57 y.o. female  [id number]  [id number]  DOB - 09/06/1960  Subjective:  Chief Complaint and HPI: Karen Morrow is a 57 y.o. female here today for RF on meds.  Just had blood work done with kidney doctor last week.  Dialysis T,Th, Saturday.  No problems or complaints.    ROS:   Constitutional:  No f/c, No night sweats, No unexplained weight loss. EENT:  No vision changes, No blurry vision, No hearing changes. No mouth, throat, or ear problems.  Respiratory: No cough, No SOB Cardiac: No CP, no palpitations GI:  No abd pain, No N/V/D. GU: No Urinary s/sx Musculoskeletal: No joint pain Neuro: No headache, no dizziness, no motor weakness.  Skin: No rash Endocrine:  No polydipsia. No polyuria.  Psych: Denies SI/HI  No problems updated.  ALLERGIES: Allergies  Allergen Reactions  . Cefazolin Other (See Comments)    Severe thrombocytopenia    PAST MEDICAL HISTORY: Past Medical History:  Diagnosis Date  . Anginal pain ([place])   . Asthma   . Chronic kidney disease    dialysis 3x wk  . Dyspnea   . Frequent bowel movements   . GERD (gastroesophageal reflux disease)   . Hepatitis C antibody test positive   . Hypertension    "just dx'd today" (11/03/2012)    MEDICATIONS AT HOME: Prior to Admission medications   Medication Sig Start Date End Date Taking? Authorizing Provider  albuterol (PROVENTIL HFA;VENTOLIN HFA) 108 (90 Base) MCG/ACT inhaler Inhale 2 puffs into the lungs every 6 (six) hours as needed for wheezing or shortness of breath. 10/15/16  Yes Funches, Josalyn, MD  albuterol (PROVENTIL) (2.5 MG/3ML) 0.083% nebulizer solution Take 3 mLs (2.5 mg total) by nebulization every 6 (six) hours as needed for wheezing or shortness of breath. 04/16/16  Yes Funches, Josalyn, MD  amLODipine (NORVASC) 10 MG tablet Take 1 tablet (10 mg total) by mouth at bedtime. 11/23/17  Yes  Argentina Donovan, PA-C  calcium acetate (PHOSLO) 667 MG capsule Take 2,001 mg by mouth 3 (three) times daily with meals.   Yes [provider]  metoprolol tartrate (LOPRESSOR) 25 MG tablet Take 1 tablet (25 mg total) by mouth 2 (two) times daily. 11/23/17  Yes Argentina Donovan, PA-C  nicotine polacrilex (NICORETTE) 4 MG gum Take 1 each (4 mg total) by mouth as needed for smoking cessation. Patient not taking: Reported on 11/23/2017 06/17/17   Alfonse Spruce, FNP  pantoprazole (PROTONIX) 20 MG tablet Take 1 tablet (20 mg total) by mouth 2 (two) times daily before a meal. 11/23/17   Argentina Donovan, PA-C     Objective:  EXAM:   Vitals:   11/23/17 1602  BP: (!) 151/86  Pulse: 81  Resp: 18  Temp: 98 F (36.7 C)  TempSrc: Oral  SpO2: 98%  Weight: 147 lb (66.7 kg)  Height: 5\' 7"  (1.702 m)    General appearance : A&OX3. NAD. Non-toxic-appearing HEENT: Atraumatic and Normocephalic.  PERRLA. EOM intact.   Neck: supple, no JVD. No cervical lymphadenopathy. No thyromegaly Chest/Lungs:  Breathing-non-labored, Good air entry bilaterally, breath sounds normal without rales, rhonchi, or wheezing  CVS: S1 S2 regular, no murmurs, gallops, rubs  Extremities: Bilateral Lower Ext shows no edema, both legs are warm to touch with = pulse throughout Neurology:  CN II-XII grossly intact, Non focal.   Psych:  TP linear.  J/I WNL. Normal speech. Appropriate eye contact and affect.  Skin:  No Rash  Data Review Lab Results  Component Value Date   HGBA1C 4.7 11/26/2013     Assessment & Plan   1. Essential hypertension suboptimal control but was out of meds-resume meds - amLODipine (NORVASC) 10 MG tablet; Take 1 tablet (10 mg total) by mouth at bedtime.  Dispense: 30 tablet; Refill: 5 - metoprolol tartrate (LOPRESSOR) 25 MG tablet; Take 1 tablet (25 mg total) by mouth 2 (two) times daily.  Dispense: 60 tablet; Refill: 5  2. Medication refill - amLODipine (NORVASC) 10 MG tablet; Take  1 tablet (10 mg total) by mouth at bedtime.  Dispense: 30 tablet; Refill: 5 - metoprolol tartrate (LOPRESSOR) 25 MG tablet; Take 1 tablet (25 mg total) by mouth 2 (two) times daily.  Dispense: 60 tablet; Refill: 5  3. Gastroesophageal reflux disease, esophagitis presence not specified - pantoprazole (PROTONIX) 20 MG tablet; Take 1 tablet (20 mg total) by mouth 2 (two) times daily before a meal.  Dispense: 60 tablet; Refill: 5     Patient have been counseled extensively about nutrition and exercise  Return in about 3 months (around 02/23/2018) for assign new PCP; htn; gerd.  The patient was given clear instructions to go to ER or return to medical center if symptoms don't improve, worsen or new problems develop. The patient verbalized understanding. The patient was told to call to get lab results if they haven't heard anything in the next week.     Freeman Caldron, PA-C [facility], [place]   11/23/2017, 4:06 PM

## 2017-11-24 ENCOUNTER — Telehealth: Payer: Self-pay | Admitting: Internal Medicine

## 2017-11-24 ENCOUNTER — Other Ambulatory Visit: Payer: Self-pay | Admitting: Physician Assistant

## 2017-11-24 DIAGNOSIS — R0602 Shortness of breath: Secondary | ICD-10-CM

## 2017-11-24 MED ORDER — ALBUTEROL SULFATE HFA 108 (90 BASE) MCG/ACT IN AERS: 2.0000 | INHALATION_SPRAY | Freq: Four times a day (QID) | RESPIRATORY_TRACT | 0 refills | Status: DC | PRN

## 2017-11-24 MED ORDER — ALBUTEROL SULFATE (2.5 MG/3ML) 0.083% IN NEBU: 2.5000 mg | INHALATION_SOLUTION | Freq: Four times a day (QID) | RESPIRATORY_TRACT | 3 refills | Status: DC | PRN

## 2017-11-24 NOTE — Telephone Encounter (Signed)
Patient husband called and requested for listed medication to be refilled and sent to pharmacy  albuterol (PROVENTIL) (2.5 MG/3ML) 0.083% nebulizer solution [[id number]]    [id number] - [place], [place] - [address].

## 2017-11-24 NOTE — Telephone Encounter (Signed)
Pt saw Karen Morrow yesterday asked Karen Morrow would she be able to refill solution. [name] will be sending rx to pts preferred pharmacy

## 2017-11-28 DIAGNOSIS — I120 Hypertensive chronic kidney disease with stage 5 chronic kidney disease or end stage renal disease: Secondary | ICD-10-CM | POA: Diagnosis not present

## 2017-11-28 DIAGNOSIS — N186 End stage renal disease: Secondary | ICD-10-CM | POA: Diagnosis not present

## 2017-11-28 DIAGNOSIS — Z992 Dependence on renal dialysis: Secondary | ICD-10-CM | POA: Diagnosis not present

## 2017-12-12 ENCOUNTER — Ambulatory Visit: Payer: Self-pay | Admitting: Internal Medicine

## 2017-12-16 ENCOUNTER — Other Ambulatory Visit: Payer: Self-pay | Admitting: Physician Assistant

## 2017-12-16 DIAGNOSIS — R0602 Shortness of breath: Secondary | ICD-10-CM

## 2017-12-29 DIAGNOSIS — I120 Hypertensive chronic kidney disease with stage 5 chronic kidney disease or end stage renal disease: Secondary | ICD-10-CM | POA: Diagnosis not present

## 2017-12-29 DIAGNOSIS — Z992 Dependence on renal dialysis: Secondary | ICD-10-CM | POA: Diagnosis not present

## 2017-12-29 DIAGNOSIS — N186 End stage renal disease: Secondary | ICD-10-CM | POA: Diagnosis not present

## 2017-12-30 MED FILL — PANTOPRAZOLE SOD DR 20 MG T: 20 | 30 days supply | Qty: 60 | Fill #0

## 2018-01-01 ENCOUNTER — Emergency Department (HOSPITAL_COMMUNITY): Payer: Self-pay

## 2018-01-01 ENCOUNTER — Encounter (HOSPITAL_COMMUNITY): Payer: Self-pay | Admitting: Emergency Medicine

## 2018-01-01 ENCOUNTER — Emergency Department (HOSPITAL_COMMUNITY)
Admission: EM | Admit: 2018-01-01 | Discharge: 2018-01-02 | Disposition: A | Discharge status: Home / Self Care | Payer: Self-pay | Attending: Emergency Medicine | Admitting: Emergency Medicine

## 2018-01-01 DIAGNOSIS — Z992 Dependence on renal dialysis: Secondary | ICD-10-CM | POA: Insufficient documentation

## 2018-01-01 DIAGNOSIS — F17210 Nicotine dependence, cigarettes, uncomplicated: Secondary | ICD-10-CM | POA: Insufficient documentation

## 2018-01-01 DIAGNOSIS — I120 Hypertensive chronic kidney disease with stage 5 chronic kidney disease or end stage renal disease: Secondary | ICD-10-CM | POA: Insufficient documentation

## 2018-01-01 DIAGNOSIS — J4541 Moderate persistent asthma with (acute) exacerbation: Secondary | ICD-10-CM

## 2018-01-01 DIAGNOSIS — N186 End stage renal disease: Secondary | ICD-10-CM | POA: Insufficient documentation

## 2018-01-01 DIAGNOSIS — Z79899 Other long term (current) drug therapy: Secondary | ICD-10-CM | POA: Insufficient documentation

## 2018-01-01 NOTE — ED Triage Notes (Signed)
Pt presents with continued sob x 2 days despite using home neb machine and inhalers; denies CP

## 2018-01-02 LAB — BASIC METABOLIC PANEL
Anion gap: 16 — ABNORMAL HIGH (ref 5–15)
BUN: 18 mg/dL (ref 6–20)
CO2: 25 mmol/L (ref 22–32)
Calcium: 10 mg/dL (ref 8.9–10.3)
Chloride: 92 mmol/L — ABNORMAL LOW (ref 98–111)
Creatinine, Ser: 6.24 mg/dL — ABNORMAL HIGH (ref 0.44–1.00)
GFR calc Af Amer: 8 mL/min — ABNORMAL LOW (ref >60)
GFR calc non Af Amer: 7 mL/min — ABNORMAL LOW (ref >60)
Glucose, Bld: 80 mg/dL (ref 70–99)
Potassium: 4.3 mmol/L (ref 3.5–5.1)
Sodium: 133 mmol/L — ABNORMAL LOW (ref 135–145)

## 2018-01-02 LAB — CBC WITH DIFFERENTIAL/PLATELET
Abs Immature Granulocytes: 0 10*3/uL (ref 0.0–0.1)
Basophils Absolute: 0 10*3/uL (ref 0.0–0.1)
Basophils Relative: 1 %
Eosinophils Absolute: 0.2 10*3/uL (ref 0.0–0.7)
Eosinophils Relative: 4 %
HCT: 30.3 % — ABNORMAL LOW (ref 36.0–46.0)
Hemoglobin: 10.2 g/dL — ABNORMAL LOW (ref 12.0–15.0)
Immature Granulocytes: 0 %
Lymphocytes Relative: 22 %
Lymphs Abs: 1.1 10*3/uL (ref 0.7–4.0)
MCH: 31.9 pg (ref 26.0–34.0)
MCHC: 33.7 g/dL (ref 30.0–36.0)
MCV: 94.7 fL (ref 78.0–100.0)
Monocytes Absolute: 0.3 10*3/uL (ref 0.1–1.0)
Monocytes Relative: 7 %
Neutro Abs: 3.3 10*3/uL (ref 1.7–7.7)
Neutrophils Relative %: 66 %
Platelets: 164 10*3/uL (ref 150–400)
RBC: 3.2 MIL/uL — ABNORMAL LOW (ref 3.87–5.11)
RDW: 13.1 % (ref 11.5–15.5)
WBC: 5 10*3/uL (ref 4.0–10.5)

## 2018-01-02 MED ORDER — PREDNISONE 20 MG PO TABS
20.0000 mg | ORAL_TABLET | Freq: Once | ORAL | Status: AC
  Administered 2018-01-02: 20 mg via ORAL
  Filled 2018-01-02: qty 1

## 2018-01-02 MED ORDER — IPRATROPIUM-ALBUTEROL 0.5-2.5 (3) MG/3ML IN SOLN
3.0000 mL | Freq: Once | RESPIRATORY_TRACT | Status: AC
  Administered 2018-01-02: 3 mL via RESPIRATORY_TRACT
  Filled 2018-01-02: qty 3

## 2018-01-02 MED ORDER — PREDNISONE 10 MG PO TABS: 20.0000 mg | ORAL_TABLET | Freq: Two times a day (BID) | ORAL | 0 refills | Status: DC

## 2018-01-02 NOTE — ED Provider Notes (Signed)
[facility] EMERGENCY DEPARTMENT Provider Note   CSN: [id number] Arrival date & time: 01/01/18  2047     History   Chief Complaint Chief Complaint  Patient presents with  . Shortness of Breath    HPI Karen Morrow is a 57 y.o. female.  Patient is a 57 year old female with past medical history of end-stage renal disease on hemodialysis, GERD, hepatitis, hypertension, and asthma.  She presents with shortness of breath.  This is been worsening over the past several days.  She reports wheezing at home that was originally improved with her inhalers, however is now worsening.  She denies any chest pain, fevers, or chills.  She denies any productive cough.  She was last dialyzed Saturday and is due for dialysis again on Tuesday.  She denies that she is above her dry weight.     Past Medical History:  Diagnosis Date  . Anginal pain ([place])   . Asthma   . Chronic kidney disease    dialysis 3x wk  . Dyspnea   . Frequent bowel movements   . GERD (gastroesophageal reflux disease)   . Hepatitis C antibody test positive   . Hypertension    "just dx'd today" (11/03/2012)    Patient Active Problem List   Diagnosis Date Noted  . Pulmonary hypertension due to left ventricular diastolic dysfunction ([place]) 11/08/2016  . Chronic cough 10/15/2016  . Acute GI bleeding 09/07/2016  . Gastroesophageal reflux disease 04/16/2016  . Anemia in chronic kidney disease, on chronic dialysis ([place]) 04/16/2016  . Primary insomnia 04/16/2016  . Shortness of breath 04/16/2016  . Pruritus 12/11/2014  . Hordeolum externum (stye) 12/11/2014  . Xanthelasma of right upper eyelid 12/11/2014  . Dermatitis 10/30/2014  . End stage renal disease on dialysis ([place]) 02/25/2014  . Malnutrition of moderate degree ([place]) 11/28/2013  . HTN (hypertension) 11/26/2013  . Gastric ulcer 06/07/2013  . Chest pain 11/03/2012  . Thrombocytopenia ([place]) 11/03/2012  . Elevated transaminase level 11/03/2012  .  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 Hepatitis C antibody test positive     Past Surgical History:  Procedure Laterality Date  . ABDOMINAL HYSTERECTOMY     "partial" (11/03/2012)  . AV FISTULA PLACEMENT Right 12/03/2013   Procedure: CREATION OF ARTERIOVENOUS (AV) FISTULA RIGHT ARM ;  Surgeon: Rosetta Posner, MD;  Location: [facility];  Service: Vascular;  Laterality: Right;  . BASCILIC VEIN TRANSPOSITION Right 03/06/2014   Procedure: SECOND STAGE BASILIC VEIN TRANSPOSITION;  Surgeon: Rosetta Posner, MD;  Location: [facility] OR;  Service: Vascular;  Laterality: Right;  . ESOPHAGOGASTRODUODENOSCOPY N/A 06/05/2013   Procedure: ESOPHAGOGASTRODUODENOSCOPY (EGD);  Surgeon: Winfield Cunas., MD;  Location: [facility];  Service: Endoscopy;  Laterality: N/A;  . ESOPHAGOGASTRODUODENOSCOPY N/A 09/08/2016   Procedure: ESOPHAGOGASTRODUODENOSCOPY (EGD);  Surgeon: Milus Banister, MD;  Location: [facility];  Service: Endoscopy;  Laterality: N/A;  . REVISON OF ARTERIOVENOUS FISTULA Right 01/28/2017   Procedure: REVISON OF RIGHT ARM ARTERIOVENOUS FISTULA USING 8MMX10CM GORETEX GRAFT;  Surgeon: Angelia Mould, MD;  Location: [facility];  Service: Vascular;  Laterality: Right;     OB History   None      Home Medications    Prior to Admission medications   Medication Sig Start Date End Date Taking? Authorizing Provider  albuterol (PROVENTIL HFA;VENTOLIN HFA) 108 (90 Base) MCG/ACT inhaler Inhale 2 puffs into the lungs every 6 (six) hours as needed for wheezing or shortness of  breath. MUST MAKE APPT FOR FURTHER REFILLS 12/16/17   [name], MD  albuterol (PROVENTIL) (2.5 MG/3ML) 0.083% nebulizer solution Take 3 mLs (2.5 mg total) by nebulization every 6 (six) hours as needed for wheezing or shortness of breath. 11/24/17   Argentina Donovan, PA-C  amLODipine (NORVASC) 10 MG tablet Take 1 tablet (10 mg total) by mouth at bedtime. 11/23/17   Argentina Donovan, PA-C    calcium acetate (PHOSLO) 667 MG capsule Take 2,001 mg by mouth 3 (three) times daily with meals.    [provider]  metoprolol tartrate (LOPRESSOR) 25 MG tablet Take 1 tablet (25 mg total) by mouth 2 (two) times daily. 11/23/17   Argentina Donovan, PA-C  nicotine polacrilex (NICORETTE) 4 MG gum Take 1 each (4 mg total) by mouth as needed for smoking cessation. Patient not taking: Reported on 11/23/2017 06/17/17   Alfonse Spruce, FNP  pantoprazole (PROTONIX) 20 MG tablet Take 1 tablet (20 mg total) by mouth 2 (two) times daily before a meal. 11/23/17   Argentina Donovan, PA-C    Family History Family History  Problem Relation Age of Onset  . Lupus Mother   . Diabetes Father   . Heart attack Father     Social History Social History   Tobacco Use  . Smoking status: Light Tobacco Smoker    Packs/day: 0.50    Years: 20.00    Pack years: 10.00    Types: Cigarettes  . Smokeless tobacco: Never Used  Substance Use Topics  . Alcohol use: Yes    Alcohol/week: 0.6 oz    Types: 1 Glasses of wine per week    Comment: 09/07/16-pt reports less than 1 glass of wine/week. 11/03/2012 "1-2 40oz beers/day maybe"   11/17/15 "I don;t drink beer anymore since Ive been on dialysis". p  . Drug use: Yes    Types: Marijuana    Comment: 11/03/2012 "maybe once or twice/wk" 11/17/15 "I haven't smoked in 3-4 months"     Allergies   Cefazolin   Review of Systems Review of Systems  All other systems reviewed and are negative.    Physical Exam Updated Vital Signs BP (!) 191/104 (BP Location: Left Arm)   Pulse (!) 101   Temp 98.2 F (36.8 C) (Oral)   Resp 19   Ht 5\' 7"  (1.702 m)   Wt 65.8 kg (145 lb)   SpO2 97%   BMI 22.71 kg/m   Physical Exam  Constitutional: She is oriented to person, place, and time. She appears well-developed and well-nourished. No distress.  HENT:  Head: Normocephalic and atraumatic.  Neck: Normal range of motion. Neck supple.  Cardiovascular: Normal rate and  regular rhythm. Exam reveals no gallop and no friction rub.  No murmur heard. Pulmonary/Chest: Effort normal. No respiratory distress. She has wheezes in the right middle field and the left middle field.  There are slight expiratory wheezes audible bilaterally  Abdominal: Soft. Bowel sounds are normal. She exhibits no distension. There is no tenderness.  Musculoskeletal: Normal range of motion.       Right lower leg: Normal. She exhibits no edema.       Left lower leg: Normal. She exhibits no edema.  Neurological: She is alert and oriented to person, place, and time.  Skin: Skin is warm and dry. She is not diaphoretic.  Nursing note and vitals reviewed.    ED Treatments / Results  Labs (all labs ordered are listed, but only abnormal results are  displayed) Labs Reviewed  I-STAT CHEM 8, ED    EKG None  Radiology Dg Chest 2 View  Result Date: 01/01/2018 CLINICAL DATA:  Shortness of breath for 2 days despite nebulizer at home and inhaler, history asthma EXAM: CHEST - 2 VIEW COMPARISON:  09/06/2016 FINDINGS: Enlargement of cardiac silhouette with pulmonary vascular congestion. Atherosclerotic calcification aorta. Mediastinal contours normal. Increased RIGHT pleural effusion and basilar atelectasis since previous exam. Bronchitic changes with minimal chronic LEFT basilar atelectasis versus scarring. No definite acute infiltrate or pneumothorax. Bones demineralized. IMPRESSION: Enlargement of cardiac silhouette with pulmonary vascular congestion. Bronchitic changes with increased RIGHT pleural effusion and basilar atelectasis since previous exam. Electronically Signed   By: Lavonia Dana M.D.   On: 01/01/2018 21:39    Procedures Procedures (including critical care time)  Medications Ordered in ED Medications  predniSONE (DELTASONE) tablet 20 mg (has no administration in time range)  ipratropium-albuterol (DUONEB) 0.5-2.5 (3) MG/3ML nebulizer solution 3 mL (has no administration in time  range)     Initial Impression / Assessment and Plan / ED Course  I have reviewed the triage vital signs and the nursing notes.  Pertinent labs & imaging results that were available during my care of the patient were reviewed by me and considered in my medical decision making (see chart for details).  Patient's breathing issues seem to be more asthma related than renal.  She does not appear volume overloaded.  Her oxygen levels are 98% on room air and she appears in no distress.  She was given prednisone and a nebulizer treatment and is now feeling much better.  At this point, I see no indication for emergent dialysis or other intervention.  She will be treated with prednisone and continued use of her albuterol.  Final Clinical Impressions(s) / ED Diagnoses   Final diagnoses:  None    ED Discharge Orders    None       Veryl Speak, MD 01/02/18 0206

## 2018-01-02 NOTE — Discharge Instructions (Signed)
Prednisone as prescribed.  Continue use of your nebulizer/inhaler as needed.

## 2018-01-29 DIAGNOSIS — Z992 Dependence on renal dialysis: Secondary | ICD-10-CM | POA: Diagnosis not present

## 2018-01-29 DIAGNOSIS — N186 End stage renal disease: Secondary | ICD-10-CM | POA: Diagnosis not present

## 2018-01-29 DIAGNOSIS — I120 Hypertensive chronic kidney disease with stage 5 chronic kidney disease or end stage renal disease: Secondary | ICD-10-CM | POA: Diagnosis not present

## 2018-02-20 MED FILL — PANTOPRAZOLE SOD DR 20 MG T: 20 | 30 days supply | Qty: 60 | Fill #1

## 2018-02-28 DIAGNOSIS — I120 Hypertensive chronic kidney disease with stage 5 chronic kidney disease or end stage renal disease: Secondary | ICD-10-CM | POA: Diagnosis not present

## 2018-02-28 DIAGNOSIS — D631 Anemia in chronic kidney disease: Secondary | ICD-10-CM | POA: Diagnosis not present

## 2018-02-28 DIAGNOSIS — A4101 Sepsis due to Methicillin susceptible Staphylococcus aureus: Secondary | ICD-10-CM | POA: Diagnosis not present

## 2018-02-28 DIAGNOSIS — Z992 Dependence on renal dialysis: Secondary | ICD-10-CM | POA: Diagnosis not present

## 2018-02-28 DIAGNOSIS — N186 End stage renal disease: Secondary | ICD-10-CM | POA: Diagnosis not present

## 2018-02-28 DIAGNOSIS — N2581 Secondary hyperparathyroidism of renal origin: Secondary | ICD-10-CM | POA: Diagnosis not present

## 2018-02-28 DIAGNOSIS — E875 Hyperkalemia: Secondary | ICD-10-CM | POA: Diagnosis not present

## 2018-02-28 DIAGNOSIS — K7460 Unspecified cirrhosis of liver: Secondary | ICD-10-CM | POA: Diagnosis not present

## 2018-03-02 DIAGNOSIS — A4101 Sepsis due to Methicillin susceptible Staphylococcus aureus: Secondary | ICD-10-CM | POA: Diagnosis not present

## 2018-03-02 DIAGNOSIS — Z992 Dependence on renal dialysis: Secondary | ICD-10-CM | POA: Diagnosis not present

## 2018-03-02 DIAGNOSIS — D631 Anemia in chronic kidney disease: Secondary | ICD-10-CM | POA: Diagnosis not present

## 2018-03-02 DIAGNOSIS — N2581 Secondary hyperparathyroidism of renal origin: Secondary | ICD-10-CM | POA: Diagnosis not present

## 2018-03-02 DIAGNOSIS — N186 End stage renal disease: Secondary | ICD-10-CM | POA: Diagnosis not present

## 2018-03-02 DIAGNOSIS — K7460 Unspecified cirrhosis of liver: Secondary | ICD-10-CM | POA: Diagnosis not present

## 2018-03-04 DIAGNOSIS — N186 End stage renal disease: Secondary | ICD-10-CM | POA: Diagnosis not present

## 2018-03-04 DIAGNOSIS — K7460 Unspecified cirrhosis of liver: Secondary | ICD-10-CM | POA: Diagnosis not present

## 2018-03-04 DIAGNOSIS — N2581 Secondary hyperparathyroidism of renal origin: Secondary | ICD-10-CM | POA: Diagnosis not present

## 2018-03-04 DIAGNOSIS — Z992 Dependence on renal dialysis: Secondary | ICD-10-CM | POA: Diagnosis not present

## 2018-03-04 DIAGNOSIS — A4101 Sepsis due to Methicillin susceptible Staphylococcus aureus: Secondary | ICD-10-CM | POA: Diagnosis not present

## 2018-03-04 DIAGNOSIS — D631 Anemia in chronic kidney disease: Secondary | ICD-10-CM | POA: Diagnosis not present

## 2018-03-07 DIAGNOSIS — A4101 Sepsis due to Methicillin susceptible Staphylococcus aureus: Secondary | ICD-10-CM | POA: Diagnosis not present

## 2018-03-07 DIAGNOSIS — D631 Anemia in chronic kidney disease: Secondary | ICD-10-CM | POA: Diagnosis not present

## 2018-03-07 DIAGNOSIS — Z992 Dependence on renal dialysis: Secondary | ICD-10-CM | POA: Diagnosis not present

## 2018-03-07 DIAGNOSIS — K7460 Unspecified cirrhosis of liver: Secondary | ICD-10-CM | POA: Diagnosis not present

## 2018-03-07 DIAGNOSIS — N186 End stage renal disease: Secondary | ICD-10-CM | POA: Diagnosis not present

## 2018-03-07 DIAGNOSIS — N2581 Secondary hyperparathyroidism of renal origin: Secondary | ICD-10-CM | POA: Diagnosis not present

## 2018-03-08 ENCOUNTER — Emergency Department (HOSPITAL_COMMUNITY)
Admission: EM | Admit: 2018-03-08 | Discharge: 2018-03-09 | Disposition: A | Discharge status: Home / Self Care | Payer: Medicare Other | Source: Home / Self Care | Attending: Emergency Medicine | Admitting: Emergency Medicine

## 2018-03-08 ENCOUNTER — Other Ambulatory Visit: Payer: Self-pay

## 2018-03-08 ENCOUNTER — Ambulatory Visit (HOSPITAL_COMMUNITY)
Admission: EM | Admit: 2018-03-08 | Discharge: 2018-03-08 | Disposition: A | Discharge status: Home / Self Care | Payer: Self-pay

## 2018-03-08 ENCOUNTER — Encounter (HOSPITAL_COMMUNITY): Payer: Self-pay | Admitting: Emergency Medicine

## 2018-03-08 DIAGNOSIS — S299XXA Unspecified injury of thorax, initial encounter: Secondary | ICD-10-CM | POA: Diagnosis not present

## 2018-03-08 DIAGNOSIS — N2581 Secondary hyperparathyroidism of renal origin: Secondary | ICD-10-CM | POA: Diagnosis not present

## 2018-03-08 DIAGNOSIS — S3991XA Unspecified injury of abdomen, initial encounter: Secondary | ICD-10-CM | POA: Diagnosis not present

## 2018-03-08 DIAGNOSIS — E875 Hyperkalemia: Secondary | ICD-10-CM | POA: Diagnosis not present

## 2018-03-08 DIAGNOSIS — Z79899 Other long term (current) drug therapy: Secondary | ICD-10-CM

## 2018-03-08 DIAGNOSIS — I120 Hypertensive chronic kidney disease with stage 5 chronic kidney disease or end stage renal disease: Secondary | ICD-10-CM

## 2018-03-08 DIAGNOSIS — B373 Candidiasis of vulva and vagina: Secondary | ICD-10-CM

## 2018-03-08 DIAGNOSIS — A419 Sepsis, unspecified organism: Secondary | ICD-10-CM | POA: Diagnosis not present

## 2018-03-08 DIAGNOSIS — R509 Fever, unspecified: Secondary | ICD-10-CM | POA: Diagnosis not present

## 2018-03-08 DIAGNOSIS — Z992 Dependence on renal dialysis: Secondary | ICD-10-CM

## 2018-03-08 DIAGNOSIS — A4101 Sepsis due to Methicillin susceptible Staphylococcus aureus: Secondary | ICD-10-CM | POA: Diagnosis not present

## 2018-03-08 DIAGNOSIS — J45909 Unspecified asthma, uncomplicated: Secondary | ICD-10-CM

## 2018-03-08 DIAGNOSIS — N186 End stage renal disease: Secondary | ICD-10-CM

## 2018-03-08 DIAGNOSIS — I471 Supraventricular tachycardia: Secondary | ICD-10-CM | POA: Diagnosis not present

## 2018-03-08 DIAGNOSIS — F17210 Nicotine dependence, cigarettes, uncomplicated: Secondary | ICD-10-CM

## 2018-03-08 DIAGNOSIS — R531 Weakness: Secondary | ICD-10-CM

## 2018-03-08 DIAGNOSIS — T827XXA Infection and inflammatory reaction due to other cardiac and vascular devices, implants and grafts, initial encounter: Secondary | ICD-10-CM | POA: Diagnosis not present

## 2018-03-08 DIAGNOSIS — J9601 Acute respiratory failure with hypoxia: Secondary | ICD-10-CM | POA: Diagnosis not present

## 2018-03-08 DIAGNOSIS — M546 Pain in thoracic spine: Secondary | ICD-10-CM | POA: Diagnosis not present

## 2018-03-08 DIAGNOSIS — R0603 Acute respiratory distress: Secondary | ICD-10-CM | POA: Diagnosis not present

## 2018-03-08 DIAGNOSIS — B3731 Acute candidiasis of vulva and vagina: Secondary | ICD-10-CM

## 2018-03-08 LAB — BASIC METABOLIC PANEL
Anion gap: 16 — ABNORMAL HIGH (ref 5–15)
BUN: 30 mg/dL — ABNORMAL HIGH (ref 6–20)
CO2: 23 mmol/L (ref 22–32)
Calcium: 10.1 mg/dL (ref 8.9–10.3)
Chloride: 94 mmol/L — ABNORMAL LOW (ref 98–111)
Creatinine, Ser: 8.34 mg/dL — ABNORMAL HIGH (ref 0.44–1.00)
GFR calc Af Amer: 5 mL/min — ABNORMAL LOW (ref >60)
GFR calc non Af Amer: 5 mL/min — ABNORMAL LOW (ref >60)
Glucose, Bld: 103 mg/dL — ABNORMAL HIGH (ref 70–99)
Potassium: 4.8 mmol/L (ref 3.5–5.1)
Sodium: 133 mmol/L — ABNORMAL LOW (ref 135–145)

## 2018-03-08 LAB — CBC
HCT: 33.5 % — ABNORMAL LOW (ref 36.0–46.0)
Hemoglobin: 10.7 g/dL — ABNORMAL LOW (ref 12.0–15.0)
MCH: 30.2 pg (ref 26.0–34.0)
MCHC: 31.9 g/dL (ref 30.0–36.0)
MCV: 94.6 fL (ref 80.0–100.0)
Platelets: 188 10*3/uL (ref 150–400)
RBC: 3.54 MIL/uL — ABNORMAL LOW (ref 3.87–5.11)
RDW: 13.6 % (ref 11.5–15.5)
WBC: 10.2 10*3/uL (ref 4.0–10.5)
nRBC: 0 % (ref 0.0–0.2)

## 2018-03-08 LAB — CK: Total CK: 531 U/L — ABNORMAL HIGH (ref 38–234)

## 2018-03-08 LAB — I-STAT BETA HCG BLOOD, ED (MC, WL, AP ONLY): I-stat hCG, quantitative: 7.9 m[IU]/mL — ABNORMAL HIGH (ref <5)

## 2018-03-08 LAB — I-STAT CG4 LACTIC ACID, ED: Lactic Acid, Venous: 1.46 mmol/L (ref 0.5–1.9)

## 2018-03-08 NOTE — ED Notes (Signed)
Patient denies making urine to [facility].  Told this RN she makes "a little sometimes".  Will leave order in computer and allow EDP to decide on collection

## 2018-03-08 NOTE — ED Triage Notes (Signed)
Pts husband states he left for work before 0800 this morning and when he returned after 1700 today he found her on the floor.  She states her legs got very shaky and she fell sometime today and was unable to get up.  She is febrile and tachycardic with mild HBP tonight.  Spoke with Dr. Mannie Stabile and he recommended the pt be taken to the ED for further evaluation.

## 2018-03-08 NOTE — ED Triage Notes (Signed)
Pt presents to ED for assessment after a fall where patient states "my legs just gave out".  Denies LOC denies head injury.  C/o being unable to get herself off the ground.  Pt's husband states when he arrived home around 1700 she was shaking uncontrollably, felt weak, and unable to walk independently.  Pt went to [facility] that sent here here, noted to be febrile and tachycardic.

## 2018-03-09 ENCOUNTER — Emergency Department (HOSPITAL_COMMUNITY): Payer: Medicare Other

## 2018-03-09 DIAGNOSIS — S3991XA Unspecified injury of abdomen, initial encounter: Secondary | ICD-10-CM | POA: Diagnosis not present

## 2018-03-09 DIAGNOSIS — R509 Fever, unspecified: Secondary | ICD-10-CM | POA: Diagnosis not present

## 2018-03-09 DIAGNOSIS — S299XXA Unspecified injury of thorax, initial encounter: Secondary | ICD-10-CM | POA: Diagnosis not present

## 2018-03-09 DIAGNOSIS — M546 Pain in thoracic spine: Secondary | ICD-10-CM | POA: Diagnosis not present

## 2018-03-09 LAB — CBG MONITORING, ED: Glucose-Capillary: 91 mg/dL (ref 70–99)

## 2018-03-09 MED ORDER — IOHEXOL 300 MG/ML  SOLN
100.0000 mL | Freq: Once | INTRAMUSCULAR | Status: AC | PRN
  Administered 2018-03-09: 100 mL via INTRAVENOUS

## 2018-03-09 MED ORDER — MICONAZOLE NITRATE APPLICATOR 100 & 2 MG-% (9GM) VA KIT: 1.0000 | PACK | Freq: Every day | VAGINAL | 0 refills | Status: DC

## 2018-03-09 NOTE — ED Notes (Signed)
Pt does not make urine so no urinalysis can be collected

## 2018-03-09 NOTE — ED Provider Notes (Signed)
[name] Provider Note   CSN: [id number] Arrival date & time: 03/08/18  2007     History   Chief Complaint Chief Complaint  Patient presents with  . Weakness    HPI Karen Morrow is a 57 y.o. female.  Patient reports that she has been feeling "crappy" all day.  She reports that she was lying on her couch earlier today, feeling extremely weak.  She somehow rolled off of the couch and could not get up.  She reports that she laid on the floor all day until her husband came home from work.  Her legs were extremely weak and could not get up.  Husband came home and found her on the ground shaking uncontrollably.  She went to urgent care where she was found to have a fever, sent to the ER for further work-up.  Patient denies cough, chest pain, shortness of breath.  She has been noticing abdominal pain.  She reports that she only makes urine " once in a blue moon".  She is a dialysis patient.     Past Medical History:  Diagnosis Date  . Anginal pain ([place])   . Asthma   . Chronic kidney disease    dialysis 3x wk  . Dyspnea   . Frequent bowel movements   . GERD (gastroesophageal reflux disease)   . Hepatitis C antibody test positive   . Hypertension    "just dx'd today" (11/03/2012)    Patient Active Problem List   Diagnosis Date Noted  . Pulmonary hypertension due to left ventricular diastolic dysfunction ([place]) 11/08/2016  . Chronic cough 10/15/2016  . Acute GI bleeding 09/07/2016  . Gastroesophageal reflux disease 04/16/2016  . Anemia in chronic kidney disease, on chronic dialysis ([place]) 04/16/2016  . Primary insomnia 04/16/2016  . Shortness of breath 04/16/2016  . Pruritus 12/11/2014  . Hordeolum externum (stye) 12/11/2014  . Xanthelasma of right upper eyelid 12/11/2014  . Dermatitis 10/30/2014  . End stage renal disease on dialysis ([place]) 02/25/2014  . Malnutrition of moderate degree ([place]) 11/28/2013  . HTN (hypertension)  11/26/2013  . Gastric ulcer 06/07/2013  . Chest pain 11/03/2012  . Thrombocytopenia ([place]) 11/03/2012  . Elevated transaminase level 11/03/2012  . 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 Hepatitis C antibody test positive     Past Surgical History:  Procedure Laterality Date  . ABDOMINAL HYSTERECTOMY     "partial" (11/03/2012)  . AV FISTULA PLACEMENT Right 12/03/2013   Procedure: CREATION OF ARTERIOVENOUS (AV) FISTULA RIGHT ARM ;  Surgeon: Rosetta Posner, MD;  Location: [facility];  Service: Vascular;  Laterality: Right;  . BASCILIC VEIN TRANSPOSITION Right 03/06/2014   Procedure: SECOND STAGE BASILIC VEIN TRANSPOSITION;  Surgeon: Rosetta Posner, MD;  Location: [facility];  Service: Vascular;  Laterality: Right;  . ESOPHAGOGASTRODUODENOSCOPY N/A 06/05/2013   Procedure: ESOPHAGOGASTRODUODENOSCOPY (EGD);  Surgeon: Winfield Cunas., MD;  Location: [facility];  Service: Endoscopy;  Laterality: N/A;  . ESOPHAGOGASTRODUODENOSCOPY N/A 09/08/2016   Procedure: ESOPHAGOGASTRODUODENOSCOPY (EGD);  Surgeon: Milus Banister, MD;  Location: [facility];  Service: Endoscopy;  Laterality: N/A;  . REVISON OF ARTERIOVENOUS FISTULA Right 01/28/2017   Procedure: REVISON OF RIGHT ARM ARTERIOVENOUS FISTULA USING 8MMX10CM GORETEX GRAFT;  Surgeon: Angelia Mould, MD;  Location: [facility];  Service: Vascular;  Laterality: Right;     OB History   None      Home Medications    Prior  to Admission medications   Medication Sig Start Date End Date Taking? Authorizing Provider  albuterol (PROVENTIL HFA;VENTOLIN HFA) 108 (90 Base) MCG/ACT inhaler Inhale 2 puffs into the lungs every 6 (six) hours as needed for wheezing or shortness of breath. MUST MAKE APPT FOR FURTHER REFILLS 12/16/17  Yes [name], MD  albuterol (PROVENTIL) (2.5 MG/3ML) 0.083% nebulizer solution Take 3 mLs (2.5 mg total) by nebulization every 6 (six) hours as needed for wheezing or shortness of breath.  11/24/17  Yes McClung, Angela M, PA-C  amLODipine (NORVASC) 10 MG tablet Take 1 tablet (10 mg total) by mouth at bedtime. 11/23/17  Yes Argentina Donovan, PA-C  calcium acetate (PHOSLO) 667 MG capsule Take 1,334 mg by mouth 3 (three) times daily with meals.    Yes [provider]  metoprolol tartrate (LOPRESSOR) 25 MG tablet Take 1 tablet (25 mg total) by mouth 2 (two) times daily. 11/23/17  Yes Freeman Caldron M, PA-C  pantoprazole (PROTONIX) 20 MG tablet Take 1 tablet (20 mg total) by mouth 2 (two) times daily before a meal. 11/23/17  Yes McClung, Angela M, PA-C  predniSONE (DELTASONE) 10 MG tablet Take 2 tablets (20 mg total) by mouth 2 (two) times daily with a meal. 01/02/18  Yes Delo, Nathaneil Canary, MD  nicotine polacrilex (NICORETTE) 4 MG gum Take 1 each (4 mg total) by mouth as needed for smoking cessation. Patient not taking: Reported on 11/23/2017 06/17/17   Alfonse Spruce, FNP    Family History Family History  Problem Relation Age of Onset  . Lupus Mother   . Diabetes Father   . Heart attack Father     Social History Social History   Tobacco Use  . Smoking status: Light Tobacco Smoker    Packs/day: 0.50    Years: 20.00    Pack years: 10.00    Types: Cigarettes  . Smokeless tobacco: Never Used  Substance Use Topics  . Alcohol use: Yes    Alcohol/week: 1.0 standard drinks    Types: 1 Glasses of wine per week    Comment: 09/07/16-pt reports less than 1 glass of wine/week. 11/03/2012 "1-2 40oz beers/day maybe"   11/17/15 "I don;t drink beer anymore since Ive been on dialysis". p  . Drug use: Yes    Types: Marijuana    Comment: 11/03/2012 "maybe once or twice/wk" 11/17/15 "I haven't smoked in 3-4 months"     Allergies   Cefazolin   Review of Systems Review of Systems  Constitutional: Positive for chills, fatigue and fever.  Gastrointestinal: Positive for abdominal pain.  All other systems reviewed and are negative.    Physical Exam Updated Vital Signs BP (!)  198/98   Pulse (!) 104   Temp 98.4 F (36.9 C) (Oral)   Resp 15   SpO2 99%   Physical Exam  Constitutional: She is oriented to person, place, and time. She appears well-developed and well-nourished. No distress.  HENT:  Head: Normocephalic and atraumatic.  Right Ear: Hearing normal.  Left Ear: Hearing normal.  Nose: Nose normal.  Mouth/Throat: Oropharynx is clear and moist and mucous membranes are normal.  Eyes: Pupils are equal, round, and reactive to light. Conjunctivae and EOM are normal.  Neck: Normal range of motion. Neck supple.  Cardiovascular: Regular rhythm, S1 normal and S2 normal. Exam reveals no gallop and no friction rub.  No murmur heard. Pulmonary/Chest: Effort normal and breath sounds normal. No respiratory distress. She exhibits no tenderness.  Abdominal: Soft. Normal appearance and bowel  sounds are normal. There is no hepatosplenomegaly. There is generalized tenderness. There is no rebound, no guarding, no tenderness at McBurney's point and negative Murphy's sign. No hernia.  Musculoskeletal: Normal range of motion.  Neurological: She is alert and oriented to person, place, and time. She has normal strength. No cranial nerve deficit or sensory deficit. Coordination normal. GCS eye subscore is 4. GCS verbal subscore is 5. GCS motor subscore is 6.  Skin: Skin is warm, dry and intact. No rash noted. No cyanosis.  Psychiatric: She has a normal mood and affect. Her speech is normal and behavior is normal. Thought content normal.  Nursing note and vitals reviewed.    ED Treatments / Results  Labs (all labs ordered are listed, but only abnormal results are displayed) Labs Reviewed  BASIC METABOLIC PANEL - Abnormal; Notable for the following components:      Result Value   Sodium 133 (*)    Chloride 94 (*)    Glucose, Bld 103 (*)    BUN 30 (*)    Creatinine, Ser 8.34 (*)    GFR calc non Af Amer 5 (*)    GFR calc Af Amer 5 (*)    Anion gap 16 (*)    All other  components within normal limits  CBC - Abnormal; Notable for the following components:   RBC 3.54 (*)    Hemoglobin 10.7 (*)    HCT 33.5 (*)    All other components within normal limits  CK - Abnormal; Notable for the following components:   Total CK 531 (*)    All other components within normal limits  I-STAT BETA HCG BLOOD, ED ([facility], [facility], AP ONLY) - Abnormal; Notable for the following components:   I-stat hCG, quantitative 7.9 (*)    All other components within normal limits  URINALYSIS, ROUTINE W REFLEX MICROSCOPIC  CBG MONITORING, ED  I-STAT CG4 LACTIC ACID, ED    EKG EKG Interpretation  Date/Time:  Wednesday March 08 2018 20:30:08 EDT Ventricular Rate:  113 PR Interval:  160 QRS Duration: 78 QT Interval:  340 QTC Calculation: 466 R Axis:   -12 Text Interpretation:  Sinus tachycardia Possible Left atrial enlargement Left ventricular hypertrophy Cannot rule out Septal infarct , age undetermined ST & T wave abnormality, consider lateral ischemia Abnormal ECG No significant change since last tracing Confirmed by Orpah Greek ([id number]) on 03/09/2018 2:38:35 AM   Radiology Dg Chest 2 View  Result Date: 03/09/2018 CLINICAL DATA:  Fever EXAM: CHEST - 2 VIEW COMPARISON:  01/01/2018 FINDINGS: Cardiac enlargement. No vascular congestion, edema, or consolidation. Hyperinflation may represent emphysema or asthma. No airspace disease or consolidation in the lungs. No blunting of costophrenic angles. No pneumothorax. Mediastinal contours appear intact. Calcified and tortuous aorta. IMPRESSION: Hyperinflation. Cardiac enlargement. No evidence of active pulmonary disease. Electronically Signed   By: Lucienne Capers M.D.   On: 03/09/2018 03:21   Ct Abdomen Pelvis W Contrast  Result Date: 03/09/2018 CLINICAL DATA:  Fall. Weakness and difficulty walking. EXAM: CT ABDOMEN AND PELVIS WITH CONTRAST TECHNIQUE: Multidetector CT imaging of the abdomen and pelvis was performed using the  standard protocol following bolus administration of intravenous contrast. CONTRAST:  138mL OMNIPAQUE IOHEXOL 300 MG/ML  SOLN COMPARISON:  11/13/2009 FINDINGS: Lower chest: Lung bases are clear. Mild right pleural thickening. Hepatobiliary: Mild diffuse fatty infiltration of the liver. No focal lesions. Gallbladder and bile ducts are unremarkable. Pancreas: Unremarkable. No pancreatic ductal dilatation or surrounding inflammatory changes. Spleen: Normal in size without  focal abnormality. Adrenals/Urinary Tract: No adrenal gland nodules. Diffuse bilateral renal parenchymal atrophy, representing significant progression since previous study. Delayed appearance of contrast material in the renal collecting systems consistent with chronic renal insufficiency. No hydronephrosis or hydroureter. Bladder wall is not thickened. Stomach/Bowel: Stomach, small bowel, and colon are mostly decompressed. No inflammatory infiltration is identified. Appendix is normal. Vascular/Lymphatic: Aortic atherosclerosis. No enlarged abdominal or pelvic lymph nodes. Reproductive: Status post hysterectomy. No adnexal masses. Other: No free air or free fluid in the abdomen. Abdominal wall musculature appears intact. Infiltration in the subcutaneous fat over the low pelvic and vulvar region may represent cellulitis. No abscess. Musculoskeletal: Disc space narrowing and sclerosis at the lumbosacral interspace with loss of cortex at the endplates. This has developed since previous study. Likely severe degenerative changes. Discitis not entirely excluded. Consider MRI for further evaluation if this is a clinical concern. IMPRESSION: 1. Mild diffuse fatty infiltration of the liver. 2. Diffuse bilateral renal parenchymal atrophy consistent with chronic renal insufficiency. New since previous study. No hydronephrosis or hydroureter. 3. Infiltration in the subcutaneous fat over the low pelvic and vulvar region may indicate cellulitis. No abscess. 4.  Severe degenerative changes at the lumbosacral interspace. Discitis would possibly have this appearance. Consider MRI for further evaluation if this is a clinical concern. Aortic Atherosclerosis (ICD10-I70.0). Electronically Signed   By: Lucienne Capers M.D.   On: 03/09/2018 04:36    Procedures Procedures (including critical care time)  Medications Ordered in ED Medications  iohexol (OMNIPAQUE) 300 MG/ML solution 100 mL (100 mLs Intravenous Contrast Given 03/09/18 0402)     Initial Impression / Assessment and Plan / ED Course  I have reviewed the triage vital signs and the nursing notes.  Pertinent labs & imaging results that were available during my care of the patient were reviewed by me and considered in my medical decision making (see chart for details).     Patient presents to the ER for evaluation of generalized weakness.  Patient reports that she was not feeling well during the day and then became very weak.  She was lying on the couch and somehow fell onto the floor.  She could not get up, laid on the floor for half the day until her husband came home.  At that point she was shaking all over which was likely Reiger's, as she did have a temperature when she got to the ER.  Source of fever is unclear.  She does not make urine.  Chest x-ray was clear.  She did report that she had some abdominal discomfort earlier in the day.  She therefore underwent CT scan.  This did not show any evidence of intra-abdominal infection but did show changes in the spine that could be severe arthritis or possibly discitis.  Patient therefore to be sent to radiology for MRI to further evaluate.  Unfortunately she cannot be given contrast because of her kidney failure.  Will sign out to oncoming ER physician to follow-up on MRI.   Final Clinical Impressions(s) / ED Diagnoses   Final diagnoses:  Fever, unspecified fever cause  Generalized weakness    ED Discharge Orders    None       Orpah Greek, MD 03/09/18 [phone]

## 2018-03-09 NOTE — ED Notes (Signed)
Tray ordered (Renal Diet)

## 2018-03-09 NOTE — ED Provider Notes (Signed)
Patient presented for generalized weakness and back pain with gait dysfunction.  Noted to have fever.  Plan is to follow-up on MRI for possible discitis.  I will also need to examine vulvar area for CT finding of soft tissue edema.  Patient is currently an MRI.  Nurses reports that patient only completed thoracic MRI for unstated reasons.  Not clear if this is due to anxiety or pain.  Patient subsequently however has been ambulatory, per nursing staff, to the bathroom with nonantalgic symmetric gait.  Patient still an MRI will reassess subsequently.  Plan will still be to try to proceed with lumbar MRI with pain or anxiety medication as needed.   Patient explained that she just could not make it through the MRI because it was too long and too narrow and too noisy.  When I evaluate the patient in person she has no distress.  She reports feeling immensely better.  She is alert and nontoxic.  Patient had been aware of vulvar swelling and yeast but she reports she "thought it was all better".  She denies that she is having problems with pain or discomfort.  When I examined the vulva, she is nontender.  She has extensive and diffuse soft tissue hypertrophy.  There are multiple hypopigmented areas consistent with chronic and recurrent dermatitis.  The mucosal vulvar tissues are covered with a thin, white plaque.  This is all very symmetric and chronic in appearance.  Patient either has chronic yeast vaginitis, hidradenitis/lichen planus/sclerosis.  Patient is not experiencing any pain with this.  This is been chronic and long-standing.  Recommendation for her is that we try some empiric treatment for yeast and have her schedule follow-up with GYN.  I have low suspicion that this is contributing to the patient's immediate presentation.  I did wish to proceed with sending the patient back for lumbar imaging with some anxiolysis.  She however is due for dialysis at noon.  She reports that she is no longer having any  back pain and has been ambulatory without difficulty.  I have strength tested both lower extremities which showed no deficit currently.  No specific etiology for her fever has been identified.  Plan will be for the patient to go to dialysis, schedule follow-up and return to the emergency department should she have any recurrence of fever other focal symptoms, generally not feeling well.  Patient is agreeable with this plan Physical Exam  BP (!) 198/98   Pulse (!) 104   Temp 98.4 F (36.9 C) (Oral)   Resp 15   SpO2 99%   Physical Exam  ED Course/Procedures     Procedures  MDM         Charlesetta Shanks, MD 03/09/18 1212

## 2018-03-09 NOTE — ED Notes (Signed)
Pt ambulatory to bathroom without assistance.

## 2018-03-09 NOTE — Progress Notes (Signed)
MRI of the thoracic spine completed. Patient stated could not continue with MRI in order to complete the lumbar spine. Informed [place].

## 2018-03-09 NOTE — ED Notes (Signed)
Pt to MRI- drink given.

## 2018-03-09 NOTE — ED Notes (Signed)
Tray ordered (Renal Diet) @ 1123

## 2018-03-09 NOTE — Discharge Instructions (Addendum)
1.  Follow-up with your gynecologist within the next 3 to 7 days. 2.  Follow-up with your doctor Monday next week. 3.  Return to the emergency department if you have any weakness, fever or worsening symptoms.

## 2018-03-09 NOTE — ED Notes (Signed)
Pt remains in [facility] called and stated that they were able to get thoracic but pt states she cannot do anymore. Lumbar not completed. Pt to come back at this time.

## 2018-03-09 NOTE — ED Notes (Signed)
Patient informed for need of urine sample states "I don't produce urine anymore I'm on dialysis".

## 2018-03-09 NOTE — ED Notes (Signed)
Pt is a T, TH, S dialysis -- supposed to go today- has been here since 2000 yesterday. Requesting food-- will order diet.

## 2018-03-10 ENCOUNTER — Inpatient Hospital Stay (HOSPITAL_COMMUNITY)
Admission: EM | Admit: 2018-03-10 | Discharge: 2018-03-19 | DRG: 252 | Disposition: A | Discharge status: Home / Self Care | Payer: Medicare Other | Attending: Internal Medicine | Admitting: Internal Medicine

## 2018-03-10 ENCOUNTER — Emergency Department (HOSPITAL_COMMUNITY): Payer: Medicare Other

## 2018-03-10 ENCOUNTER — Inpatient Hospital Stay (HOSPITAL_COMMUNITY): Payer: Medicare Other

## 2018-03-10 ENCOUNTER — Encounter (HOSPITAL_COMMUNITY): Payer: Self-pay | Admitting: Emergency Medicine

## 2018-03-10 DIAGNOSIS — E872 Acidosis: Secondary | ICD-10-CM | POA: Diagnosis present

## 2018-03-10 DIAGNOSIS — E876 Hypokalemia: Secondary | ICD-10-CM | POA: Diagnosis not present

## 2018-03-10 DIAGNOSIS — I959 Hypotension, unspecified: Secondary | ICD-10-CM | POA: Diagnosis present

## 2018-03-10 DIAGNOSIS — Z992 Dependence on renal dialysis: Secondary | ICD-10-CM | POA: Diagnosis not present

## 2018-03-10 DIAGNOSIS — M79671 Pain in right foot: Secondary | ICD-10-CM | POA: Diagnosis not present

## 2018-03-10 DIAGNOSIS — R29818 Other symptoms and signs involving the nervous system: Secondary | ICD-10-CM | POA: Diagnosis not present

## 2018-03-10 DIAGNOSIS — R4182 Altered mental status, unspecified: Secondary | ICD-10-CM | POA: Diagnosis not present

## 2018-03-10 DIAGNOSIS — I471 Supraventricular tachycardia: Secondary | ICD-10-CM | POA: Diagnosis not present

## 2018-03-10 DIAGNOSIS — R2689 Other abnormalities of gait and mobility: Secondary | ICD-10-CM | POA: Diagnosis not present

## 2018-03-10 DIAGNOSIS — N186 End stage renal disease: Secondary | ICD-10-CM | POA: Diagnosis not present

## 2018-03-10 DIAGNOSIS — A419 Sepsis, unspecified organism: Secondary | ICD-10-CM | POA: Diagnosis not present

## 2018-03-10 DIAGNOSIS — R7881 Bacteremia: Secondary | ICD-10-CM | POA: Diagnosis not present

## 2018-03-10 DIAGNOSIS — H0266 Xanthelasma of left eye, unspecified eyelid: Secondary | ICD-10-CM

## 2018-03-10 DIAGNOSIS — I770 Arteriovenous fistula, acquired: Secondary | ICD-10-CM | POA: Diagnosis not present

## 2018-03-10 DIAGNOSIS — F17210 Nicotine dependence, cigarettes, uncomplicated: Secondary | ICD-10-CM | POA: Diagnosis present

## 2018-03-10 DIAGNOSIS — I5032 Chronic diastolic (congestive) heart failure: Secondary | ICD-10-CM | POA: Diagnosis present

## 2018-03-10 DIAGNOSIS — Z8711 Personal history of peptic ulcer disease: Secondary | ICD-10-CM

## 2018-03-10 DIAGNOSIS — I120 Hypertensive chronic kidney disease with stage 5 chronic kidney disease or end stage renal disease: Secondary | ICD-10-CM

## 2018-03-10 DIAGNOSIS — T82590A Other mechanical complication of surgically created arteriovenous fistula, initial encounter: Secondary | ICD-10-CM | POA: Diagnosis not present

## 2018-03-10 DIAGNOSIS — E875 Hyperkalemia: Secondary | ICD-10-CM

## 2018-03-10 DIAGNOSIS — R6520 Severe sepsis without septic shock: Secondary | ICD-10-CM | POA: Diagnosis not present

## 2018-03-10 DIAGNOSIS — H0263 Xanthelasma of right eye, unspecified eyelid: Secondary | ICD-10-CM

## 2018-03-10 DIAGNOSIS — D649 Anemia, unspecified: Secondary | ICD-10-CM

## 2018-03-10 DIAGNOSIS — R1011 Right upper quadrant pain: Secondary | ICD-10-CM | POA: Diagnosis not present

## 2018-03-10 DIAGNOSIS — I350 Nonrheumatic aortic (valve) stenosis: Secondary | ICD-10-CM | POA: Diagnosis not present

## 2018-03-10 DIAGNOSIS — A4101 Sepsis due to Methicillin susceptible Staphylococcus aureus: Secondary | ICD-10-CM | POA: Diagnosis not present

## 2018-03-10 DIAGNOSIS — D631 Anemia in chronic kidney disease: Secondary | ICD-10-CM | POA: Diagnosis present

## 2018-03-10 DIAGNOSIS — B9561 Methicillin susceptible Staphylococcus aureus infection as the cause of diseases classified elsewhere: Secondary | ICD-10-CM | POA: Diagnosis not present

## 2018-03-10 DIAGNOSIS — Z720 Tobacco use: Secondary | ICD-10-CM

## 2018-03-10 DIAGNOSIS — N2581 Secondary hyperparathyroidism of renal origin: Secondary | ICD-10-CM | POA: Diagnosis not present

## 2018-03-10 DIAGNOSIS — Z79899 Other long term (current) drug therapy: Secondary | ICD-10-CM | POA: Diagnosis not present

## 2018-03-10 DIAGNOSIS — R0603 Acute respiratory distress: Secondary | ICD-10-CM

## 2018-03-10 DIAGNOSIS — I10 Essential (primary) hypertension: Secondary | ICD-10-CM | POA: Diagnosis not present

## 2018-03-10 DIAGNOSIS — K219 Gastro-esophageal reflux disease without esophagitis: Secondary | ICD-10-CM | POA: Diagnosis present

## 2018-03-10 DIAGNOSIS — R011 Cardiac murmur, unspecified: Secondary | ICD-10-CM | POA: Diagnosis not present

## 2018-03-10 DIAGNOSIS — B1920 Unspecified viral hepatitis C without hepatic coma: Secondary | ICD-10-CM | POA: Diagnosis present

## 2018-03-10 DIAGNOSIS — Z95828 Presence of other vascular implants and grafts: Secondary | ICD-10-CM

## 2018-03-10 DIAGNOSIS — I132 Hypertensive heart and chronic kidney disease with heart failure and with stage 5 chronic kidney disease, or end stage renal disease: Secondary | ICD-10-CM | POA: Diagnosis not present

## 2018-03-10 DIAGNOSIS — S3992XA Unspecified injury of lower back, initial encounter: Secondary | ICD-10-CM | POA: Diagnosis not present

## 2018-03-10 DIAGNOSIS — M79672 Pain in left foot: Secondary | ICD-10-CM | POA: Diagnosis not present

## 2018-03-10 DIAGNOSIS — A412 Sepsis due to unspecified staphylococcus: Secondary | ICD-10-CM

## 2018-03-10 DIAGNOSIS — R195 Other fecal abnormalities: Secondary | ICD-10-CM | POA: Diagnosis not present

## 2018-03-10 DIAGNOSIS — I340 Nonrheumatic mitral (valve) insufficiency: Secondary | ICD-10-CM | POA: Diagnosis not present

## 2018-03-10 DIAGNOSIS — Y832 Surgical operation with anastomosis, bypass or graft as the cause of abnormal reaction of the patient, or of later complication, without mention of misadventure at the time of the procedure: Secondary | ICD-10-CM | POA: Diagnosis present

## 2018-03-10 DIAGNOSIS — Z4682 Encounter for fitting and adjustment of non-vascular catheter: Secondary | ICD-10-CM | POA: Diagnosis not present

## 2018-03-10 DIAGNOSIS — Z881 Allergy status to other antibiotic agents status: Secondary | ICD-10-CM | POA: Diagnosis not present

## 2018-03-10 DIAGNOSIS — T82898A Other specified complication of vascular prosthetic devices, implants and grafts, initial encounter: Secondary | ICD-10-CM | POA: Diagnosis not present

## 2018-03-10 DIAGNOSIS — J90 Pleural effusion, not elsewhere classified: Secondary | ICD-10-CM | POA: Diagnosis not present

## 2018-03-10 DIAGNOSIS — R410 Disorientation, unspecified: Secondary | ICD-10-CM | POA: Diagnosis not present

## 2018-03-10 DIAGNOSIS — R509 Fever, unspecified: Secondary | ICD-10-CM | POA: Diagnosis not present

## 2018-03-10 DIAGNOSIS — Z8619 Personal history of other infectious and parasitic diseases: Secondary | ICD-10-CM | POA: Diagnosis not present

## 2018-03-10 DIAGNOSIS — T827XXD Infection and inflammatory reaction due to other cardiac and vascular devices, implants and grafts, subsequent encounter: Secondary | ICD-10-CM | POA: Diagnosis not present

## 2018-03-10 DIAGNOSIS — R0781 Pleurodynia: Secondary | ICD-10-CM | POA: Diagnosis not present

## 2018-03-10 DIAGNOSIS — R0689 Other abnormalities of breathing: Secondary | ICD-10-CM | POA: Diagnosis not present

## 2018-03-10 DIAGNOSIS — R012 Other cardiac sounds: Secondary | ICD-10-CM | POA: Diagnosis not present

## 2018-03-10 DIAGNOSIS — R109 Unspecified abdominal pain: Secondary | ICD-10-CM | POA: Diagnosis not present

## 2018-03-10 DIAGNOSIS — R1013 Epigastric pain: Secondary | ICD-10-CM | POA: Diagnosis not present

## 2018-03-10 DIAGNOSIS — I351 Nonrheumatic aortic (valve) insufficiency: Secondary | ICD-10-CM | POA: Diagnosis not present

## 2018-03-10 DIAGNOSIS — R000 Tachycardia, unspecified: Secondary | ICD-10-CM | POA: Diagnosis not present

## 2018-03-10 DIAGNOSIS — T827XXA Infection and inflammatory reaction due to other cardiac and vascular devices, implants and grafts, initial encounter: Secondary | ICD-10-CM | POA: Diagnosis not present

## 2018-03-10 DIAGNOSIS — J9601 Acute respiratory failure with hypoxia: Secondary | ICD-10-CM | POA: Diagnosis present

## 2018-03-10 DIAGNOSIS — R404 Transient alteration of awareness: Secondary | ICD-10-CM | POA: Diagnosis not present

## 2018-03-10 LAB — BLOOD CULTURE ID PANEL (REFLEXED)
Acinetobacter baumannii: NOT DETECTED
Candida albicans: NOT DETECTED
Candida glabrata: NOT DETECTED
Candida krusei: NOT DETECTED
Candida parapsilosis: NOT DETECTED
Candida tropicalis: NOT DETECTED
Carbapenem resistance: NOT DETECTED
Enterobacter cloacae complex: NOT DETECTED
Enterobacteriaceae species: NOT DETECTED
Enterococcus species: NOT DETECTED
Escherichia coli: NOT DETECTED
Haemophilus influenzae: NOT DETECTED
Klebsiella oxytoca: NOT DETECTED
Klebsiella pneumoniae: NOT DETECTED
Listeria monocytogenes: NOT DETECTED
Methicillin resistance: NOT DETECTED
Neisseria meningitidis: NOT DETECTED
Proteus species: NOT DETECTED
Pseudomonas aeruginosa: NOT DETECTED
Serratia marcescens: NOT DETECTED
Staphylococcus aureus (BCID): DETECTED — AB
Staphylococcus species: DETECTED — AB
Streptococcus agalactiae: NOT DETECTED
Streptococcus pneumoniae: NOT DETECTED
Streptococcus pyogenes: NOT DETECTED
Streptococcus species: NOT DETECTED
Vancomycin resistance: NOT DETECTED

## 2018-03-10 LAB — BASIC METABOLIC PANEL
Anion gap: 16 — ABNORMAL HIGH (ref 5–15)
BUN: 71 mg/dL — ABNORMAL HIGH (ref 6–20)
CO2: 20 mmol/L — ABNORMAL LOW (ref 22–32)
Calcium: 9 mg/dL (ref 8.9–10.3)
Chloride: 97 mmol/L — ABNORMAL LOW (ref 98–111)
Creatinine, Ser: 12.68 mg/dL — ABNORMAL HIGH (ref 0.44–1.00)
GFR calc Af Amer: 3 mL/min — ABNORMAL LOW (ref >60)
GFR calc non Af Amer: 3 mL/min — ABNORMAL LOW (ref >60)
Glucose, Bld: 109 mg/dL — ABNORMAL HIGH (ref 70–99)
Potassium: 5.3 mmol/L — ABNORMAL HIGH (ref 3.5–5.1)
Sodium: 133 mmol/L — ABNORMAL LOW (ref 135–145)

## 2018-03-10 LAB — BLOOD GAS, ARTERIAL
Acid-base deficit: 5.8 mmol/L — ABNORMAL HIGH (ref 0.0–2.0)
Bicarbonate: 20 mmol/L (ref 20.0–28.0)
Drawn by: 535471
O2 Saturation: 98.1 %
Patient temperature: 98.6
pCO2 arterial: 45.6 mmHg (ref 32.0–48.0)
pH, Arterial: 7.265 — ABNORMAL LOW (ref 7.350–7.450)
pO2, Arterial: 225 mmHg — ABNORMAL HIGH (ref 83.0–108.0)

## 2018-03-10 LAB — CBC WITH DIFFERENTIAL/PLATELET
Abs Immature Granulocytes: 0.03 10*3/uL (ref 0.00–0.07)
Basophils Absolute: 0 10*3/uL (ref 0.0–0.1)
Basophils Relative: 1 %
Eosinophils Absolute: 0.1 10*3/uL (ref 0.0–0.5)
Eosinophils Relative: 1 %
HCT: 33.4 % — ABNORMAL LOW (ref 36.0–46.0)
Hemoglobin: 11 g/dL — ABNORMAL LOW (ref 12.0–15.0)
Immature Granulocytes: 0 %
Lymphocytes Relative: 3 %
Lymphs Abs: 0.3 10*3/uL — ABNORMAL LOW (ref 0.7–4.0)
MCH: 30.3 pg (ref 26.0–34.0)
MCHC: 32.9 g/dL (ref 30.0–36.0)
MCV: 92 fL (ref 80.0–100.0)
Monocytes Absolute: 0.2 10*3/uL (ref 0.1–1.0)
Monocytes Relative: 3 %
Neutro Abs: 7.7 10*3/uL (ref 1.7–7.7)
Neutrophils Relative %: 92 %
Platelets: 154 10*3/uL (ref 150–400)
RBC: 3.63 MIL/uL — ABNORMAL LOW (ref 3.87–5.11)
RDW: 13.4 % (ref 11.5–15.5)
WBC: 8.4 10*3/uL (ref 4.0–10.5)
nRBC: 0 % (ref 0.0–0.2)

## 2018-03-10 LAB — CBC
HCT: 29.9 % — ABNORMAL LOW (ref 36.0–46.0)
Hemoglobin: 9.7 g/dL — ABNORMAL LOW (ref 12.0–15.0)
MCH: 30.8 pg (ref 26.0–34.0)
MCHC: 32.4 g/dL (ref 30.0–36.0)
MCV: 94.9 fL (ref 80.0–100.0)
Platelets: 125 10*3/uL — ABNORMAL LOW (ref 150–400)
RBC: 3.15 MIL/uL — ABNORMAL LOW (ref 3.87–5.11)
RDW: 13.7 % (ref 11.5–15.5)
WBC: 7.8 10*3/uL (ref 4.0–10.5)
nRBC: 0 % (ref 0.0–0.2)

## 2018-03-10 LAB — CBG MONITORING, ED: Glucose-Capillary: 77 mg/dL (ref 70–99)

## 2018-03-10 LAB — COMPREHENSIVE METABOLIC PANEL
ALT: 30 U/L (ref 0–44)
AST: 57 U/L — ABNORMAL HIGH (ref 15–41)
Albumin: 3.5 g/dL (ref 3.5–5.0)
Alkaline Phosphatase: 95 U/L (ref 38–126)
Anion gap: 19 — ABNORMAL HIGH (ref 5–15)
BUN: 59 mg/dL — ABNORMAL HIGH (ref 6–20)
CO2: 22 mmol/L (ref 22–32)
Calcium: 9.8 mg/dL (ref 8.9–10.3)
Chloride: 92 mmol/L — ABNORMAL LOW (ref 98–111)
Creatinine, Ser: 11.91 mg/dL — ABNORMAL HIGH (ref 0.44–1.00)
GFR calc Af Amer: 4 mL/min — ABNORMAL LOW (ref >60)
GFR calc non Af Amer: 3 mL/min — ABNORMAL LOW (ref >60)
Glucose, Bld: 105 mg/dL — ABNORMAL HIGH (ref 70–99)
Potassium: 6.2 mmol/L — ABNORMAL HIGH (ref 3.5–5.1)
Sodium: 133 mmol/L — ABNORMAL LOW (ref 135–145)
Total Bilirubin: 1.2 mg/dL (ref 0.3–1.2)
Total Protein: 7.5 g/dL (ref 6.5–8.1)

## 2018-03-10 LAB — PROTIME-INR
INR: 0.97
Prothrombin Time: 12.8 seconds (ref 11.4–15.2)

## 2018-03-10 LAB — I-STAT CG4 LACTIC ACID, ED
Lactic Acid, Venous: 2.18 mmol/L (ref 0.5–1.9)
Lactic Acid, Venous: 3.04 mmol/L (ref 0.5–1.9)

## 2018-03-10 LAB — PROCALCITONIN: Procalcitonin: 17.63 ng/mL

## 2018-03-10 LAB — CREATININE, SERUM
Creatinine, Ser: 12.02 mg/dL — ABNORMAL HIGH (ref 0.44–1.00)
GFR calc Af Amer: 3 mL/min — ABNORMAL LOW (ref >60)
GFR calc non Af Amer: 3 mL/min — ABNORMAL LOW (ref >60)

## 2018-03-10 LAB — SEDIMENTATION RATE: Sed Rate: 21 mm/hr (ref 0–22)

## 2018-03-10 LAB — C-REACTIVE PROTEIN: CRP: 3.4 mg/dL — ABNORMAL HIGH (ref <1.0)

## 2018-03-10 LAB — LACTIC ACID, PLASMA
Lactic Acid, Venous: 2 mmol/L (ref 0.5–1.9)
Lactic Acid, Venous: 3.6 mmol/L (ref 0.5–1.9)
Lactic Acid, Venous: 3.4 mmol/L (ref 0.5–1.9)

## 2018-03-10 LAB — GLUCOSE, CAPILLARY
Glucose-Capillary: 101 mg/dL — ABNORMAL HIGH (ref 70–99)
Glucose-Capillary: 149 mg/dL — ABNORMAL HIGH (ref 70–99)

## 2018-03-10 LAB — OCCULT BLOOD X 1 CARD TO LAB, STOOL: Fecal Occult Bld: POSITIVE — AB

## 2018-03-10 LAB — MRSA PCR SCREENING: MRSA by PCR: NEGATIVE

## 2018-03-10 LAB — I-STAT BETA HCG BLOOD, ED (MC, WL, AP ONLY): I-stat hCG, quantitative: 7.6 m[IU]/mL — ABNORMAL HIGH (ref <5)

## 2018-03-10 MED ORDER — SODIUM CHLORIDE 0.9 % IV SOLN
INTRAVENOUS | Status: DC | PRN
  Administered 2018-03-10: 1000 mL via INTRAVENOUS
  Administered 2018-03-16: 16:00:00 via INTRAVENOUS

## 2018-03-10 MED ORDER — METRONIDAZOLE IN NACL 5-0.79 MG/ML-% IV SOLN
500.0000 mg | Freq: Three times a day (TID) | INTRAVENOUS | Status: DC
  Administered 2018-03-10: 500 mg via INTRAVENOUS
  Filled 2018-03-10: qty 100

## 2018-03-10 MED ORDER — LABETALOL HCL 5 MG/ML IV SOLN
10.0000 mg | Freq: Once | INTRAVENOUS | Status: AC
  Administered 2018-03-10: 10 mg via INTRAVENOUS

## 2018-03-10 MED ORDER — ALBUTEROL SULFATE (2.5 MG/3ML) 0.083% IN NEBU
2.5000 mg | INHALATION_SOLUTION | RESPIRATORY_TRACT | Status: DC | PRN
  Administered 2018-03-10 – 2018-03-13 (×2): 2.5 mg via RESPIRATORY_TRACT
  Filled 2018-03-10 (×2): qty 3

## 2018-03-10 MED ORDER — PIPERACILLIN-TAZOBACTAM 3.375 G IVPB
3.3750 g | Freq: Two times a day (BID) | INTRAVENOUS | Status: DC
  Administered 2018-03-10: 3.375 g via INTRAVENOUS
  Filled 2018-03-10 (×2): qty 50

## 2018-03-10 MED ORDER — SODIUM CHLORIDE 0.9 % IV BOLUS (SEPSIS)
1000.0000 mL | Freq: Once | INTRAVENOUS | Status: AC
  Administered 2018-03-10: 1000 mL via INTRAVENOUS

## 2018-03-10 MED ORDER — CHLORHEXIDINE GLUCONATE CLOTH 2 % EX PADS
6.0000 | MEDICATED_PAD | Freq: Every day | CUTANEOUS | Status: DC
  Administered 2018-03-10 – 2018-03-12 (×3): 6 via TOPICAL

## 2018-03-10 MED ORDER — CEFAZOLIN SODIUM-DEXTROSE 1-4 GM/50ML-% IV SOLN
1.0000 g | Freq: Every day | INTRAVENOUS | Status: DC
  Administered 2018-03-10 – 2018-03-16 (×8): 1 g via INTRAVENOUS
  Filled 2018-03-10 (×12): qty 50

## 2018-03-10 MED ORDER — VANCOMYCIN VARIABLE DOSE PER UNSTABLE RENAL FUNCTION (PHARMACIST DOSING): Status: DC

## 2018-03-10 MED ORDER — LABETALOL HCL 5 MG/ML IV SOLN
INTRAVENOUS | Status: AC
  Filled 2018-03-10: qty 4

## 2018-03-10 MED ORDER — VANCOMYCIN HCL IN DEXTROSE 1-5 GM/200ML-% IV SOLN
1000.0000 mg | Freq: Once | INTRAVENOUS | Status: AC
  Administered 2018-03-10: 1000 mg via INTRAVENOUS
  Filled 2018-03-10: qty 200

## 2018-03-10 MED ORDER — SODIUM CHLORIDE 0.9 % IV BOLUS
500.0000 mL | Freq: Once | INTRAVENOUS | Status: AC
  Administered 2018-03-10: 500 mL via INTRAVENOUS

## 2018-03-10 MED ORDER — ACETAMINOPHEN 325 MG PO TABS
650.0000 mg | ORAL_TABLET | Freq: Four times a day (QID) | ORAL | Status: DC | PRN
  Administered 2018-03-11 – 2018-03-13 (×6): 650 mg via ORAL
  Filled 2018-03-10 (×7): qty 2

## 2018-03-10 MED ORDER — CHLORHEXIDINE GLUCONATE CLOTH 2 % EX PADS: 6.0000 | MEDICATED_PAD | Freq: Every day | CUTANEOUS | Status: DC

## 2018-03-10 MED ORDER — DEXTROSE 50 % IV SOLN
1.0000 | Freq: Once | INTRAVENOUS | Status: AC
  Administered 2018-03-10: 50 mL via INTRAVENOUS
  Filled 2018-03-10: qty 50

## 2018-03-10 MED ORDER — ACETAMINOPHEN 650 MG RE SUPP
650.0000 mg | Freq: Four times a day (QID) | RECTAL | Status: DC | PRN
  Administered 2018-03-10 – 2018-03-11 (×2): 650 mg via RECTAL
  Filled 2018-03-10 (×2): qty 1

## 2018-03-10 MED ORDER — DEXTROSE 50 % IV SOLN
INTRAVENOUS | Status: AC
  Administered 2018-03-10: 50 mL
  Filled 2018-03-10: qty 50

## 2018-03-10 MED ORDER — CHLORHEXIDINE GLUCONATE 0.12 % MT SOLN
15.0000 mL | Freq: Two times a day (BID) | OROMUCOSAL | Status: DC
  Administered 2018-03-10 – 2018-03-11 (×2): 15 mL via OROMUCOSAL
  Filled 2018-03-10: qty 15

## 2018-03-10 MED ORDER — DM-GUAIFENESIN ER 30-600 MG PO TB12: 1.0000 | ORAL_TABLET | Freq: Two times a day (BID) | ORAL | Status: DC

## 2018-03-10 MED ORDER — SODIUM CHLORIDE 0.9 % IV SOLN
1.0000 g | Freq: Once | INTRAVENOUS | Status: AC
  Administered 2018-03-10: 1 g via INTRAVENOUS
  Filled 2018-03-10 (×3): qty 10

## 2018-03-10 MED ORDER — INSULIN ASPART 100 UNIT/ML ~~LOC~~ SOLN
5.0000 [IU] | Freq: Once | SUBCUTANEOUS | Status: AC
  Administered 2018-03-10: 5 [IU] via INTRAVENOUS
  Filled 2018-03-10: qty 1

## 2018-03-10 MED ORDER — ACETAMINOPHEN 325 MG PO TABS
650.0000 mg | ORAL_TABLET | Freq: Once | ORAL | Status: AC
  Administered 2018-03-10: 650 mg via ORAL
  Filled 2018-03-10: qty 2

## 2018-03-10 MED ORDER — HEPARIN SODIUM (PORCINE) 5000 UNIT/ML IJ SOLN
5000.0000 [IU] | Freq: Three times a day (TID) | INTRAMUSCULAR | Status: DC
  Administered 2018-03-10 – 2018-03-16 (×15): 5000 [IU] via SUBCUTANEOUS
  Filled 2018-03-10 (×16): qty 1

## 2018-03-10 MED ORDER — ORAL CARE MOUTH RINSE
15.0000 mL | Freq: Two times a day (BID) | OROMUCOSAL | Status: DC
  Administered 2018-03-11: 15 mL via OROMUCOSAL

## 2018-03-10 NOTE — ED Notes (Signed)
Lab results was given to Nurse [facility].

## 2018-03-10 NOTE — ED Notes (Signed)
Radiology at bedside

## 2018-03-10 NOTE — ED Notes (Signed)
Family at bedside. 

## 2018-03-10 NOTE — ED Triage Notes (Signed)
Pt arrives via [facility] from home, seen 10/9 and diagnosed with generalized weakness, husband reported to ems that symptoms have gotten worse, pt now altered, oriented to person and place only. EMS vital signs: temp,100.9, RR 48, HR 140.

## 2018-03-10 NOTE — H&P (Signed)
Date: 03/10/2018               Patient Name:  Karen Morrow MRN: [id number]  DOB: 1961/02/06 Age / Sex: 57 y.o., female   PCP: [name], MD         Medical Service: Internal Medicine Teaching Service         Attending Physician: Dr. Annia Belt, MD    First Contact: Dr. Annie Paras Pager: [phone]  Second Contact: Dr. Lou Miner Pager: [phone]       After Hours (After 5p/  First Contact Pager: [phone]  weekends / holidays): Second Contact Pager: [phone]   Chief Complaint: Fever, generalized weakness and altered mental status.  History of Present Illness: Karen Morrow is a 57 y.o. female.with PMHx significant for HTN, ESRD(T.T.S), anemia and secondary hyperparathyroidism brought to ED with fever, generalized weakness and altered mental status.  Patient was seen in ED on March 08, 2018 with fever and lower back pain, labs are within normal limit, CT abdomen was done for her complaint of abdominal pain with vomiting and diarrhea which shows that some cortical irregularities in lower lumbar sacral region which were new, MRI of lumbar spine was ordered to rule out discitis, and patient was intolerable to obtained complete MRI, on the thoracic part was done and she left before completion of the study.  Patient improved with some fluids and went home. Yesterday when husband came back from work found her on the floor.  According to husband that she is feeling so weak that she cannot walk. She continued to experience fever.  Denies any chills.  She did had some vomiting and diarrhea 2 days ago which has been improved.  Her appetite has been decreased for the past 1 week.  She is also experiencing a productive cough of yellow sputum for the past 2 weeks and recently using her nebulizer more frequently.  Denies any nasal congestion or sore throat. Denies any headache, photophobia or blurry vision.  According to husband she was complaining of lower back and bilateral leg pain  yesterday but she denies any pain today.  Denies any focal weakness, urinary or fecal incontinence.  Denies any sick contacts.  Patient missed dialysis yesterday due to this generalized weakness and fever.  ED Course.  On initial presentation found to be febrile at 101.1, tachycardic and tachypneic, elevated blood pressure, lactic acidosis, no leukocytosis, hyperkalemia and was given IV vancomycin and Flagyl for sepsis with unknown source.   Meds:  No outpatient medications have been marked as taking for the 03/10/18 encounter [facility] Encounter).     Allergies: Allergies as of 03/10/2018 - Review Complete 03/10/2018  Allergen Reaction Noted  . Cefazolin Other (See Comments) 12/05/2013   Past Medical History:  Diagnosis Date  . Anginal pain ([place])   . Asthma   . Chronic kidney disease    dialysis 3x wk  . Dyspnea   . Frequent bowel movements   . GERD (gastroesophageal reflux disease)   . Hepatitis C antibody test positive   . Hypertension    "just dx'd today" (11/03/2012)    Family History: Mother had lupus and father with diabetes.  Social History: Lives with her husband, current smoker 6 to 8 cigarettes/day, occasionally drinks and uses marijuana.  Review of Systems: A complete ROS was negative except as per HPI.   Physical Exam: Blood pressure (!) 145/100, pulse (!) 152, temperature 99.3 F (37.4 C), temperature source Oral, resp. rate (!) 48,  weight 60.3 kg, SpO2 100 %. Vitals:   03/10/18 1314 03/10/18 1340 03/10/18 1346 03/10/18 1420  BP: (!) 250/110 (!) 215/120 (!) 180/110 (!) 145/100  Pulse: (!) 181  (!) 152 (!) 152  Resp: (!) 40  (!) 50 (!) 48  Temp:      TempSrc:      SpO2:   100% 100%  Weight:       General: Vital signs reviewed.  Patient is well-developed and well-nourished, in no acute distress and cooperative with exam.  Head: Normocephalic and atraumatic. Eyes: EOMI, conjunctivae normal, no scleral icterus. Xanthelasma on both eyelids. Cardiovascular:  Tachycardia with regular rhythm, no murmurs, gallops, or rubs. Pulmonary/Chest: Few basal crackles bilaterally. Abdominal: Soft, mild diffuse tenderness,non-distended, BS +, no masses, organomegaly, or guarding present.  Musculoskeletal: No tenderness along spine.  ROM was not done. Extremities: No lower extremity edema bilaterally,  pulses symmetric and intact bilaterally. No cyanosis or clubbing. Neurological: A&O x3, Strength is normal and symmetric bilaterally, cranial nerve II-XII are grossly intact, no focal motor deficit, sensory intact to light touch bilaterally.  Skin: Warm, dry and intact. No rashes or erythema. Psychiatric: Normal mood and affect. speech and behavior is normal. Cognition and memory are normal.  EKG: personally reviewed my interpretation is sinus tachycardia with peaked T waves.  CXR: personally reviewed my interpretation is no focal infiltrate.  Assessment & Plan by Problem:  [name] is a 57 y.o. female.with PMHx significant for HTN, ESRD(T.T.S), anemia and secondary hyperparathyroidism brought to ED with fever, generalized weakness and altered mental status.  Active Problems:   Sepsis ([place]) She meets SIRS criteria.  Was given fluid along with vancomycin and Flagyl. Did not receive cefepime due to her allergies listed with cefazolin. Lactic acid continue to rise, last reading was 3.04. Blood cultures were drawn. There was a suspicion of discitis on recent CT abdomen done on 03/08/18, MRI of lumbar spine has been ordered for further evaluation. Patient had cough for 2 weeks which has been recently worsened requiring multiple breathing treatments at home.  Patient is high risk for COPD due to her extensive smoking history, no pulmonary function testing on board, albuterol inhaler was on her med list.  Chest x-ray without any acute infiltrate. Might be experiencing viral bronchitis. No headache or meningeal sign. -Check ESR and CRP. -Check  procalcitonin.  Hyperkalemia.  Patient was hyperkalemic at 6.2 with peak T waves.  She was given a dose of insulin with D50 in ED. -She is going for dialysis today.  Hypertension.  Patient with elevated blood pressure on presentation which remained elevated.  Continuing home amlodipine.  History of hep C.  She was found to be positive for hep C in 2014 and in 2018.  In 2015 she was having positive viral load and genotype was done.  Could not verify whether she had been treated.  CODE STATUS.  Full DVT prophylaxis.  Heparin Diet.  Renal.  Dispo: Admit patient to Inpatient with expected length of stay greater than 2 midnights.  SignedLorella Nimrod, MD 03/10/2018, 2:36 PM  Pager: [phone]

## 2018-03-10 NOTE — ED Notes (Signed)
Dr. Christy Gentles made aware of patient's vital signs

## 2018-03-10 NOTE — Progress Notes (Signed)
PHARMACY - PHYSICIAN COMMUNICATION CRITICAL VALUE ALERT - BLOOD CULTURE IDENTIFICATION (BCID)  Karen Morrow is an 57 y.o. female who presented to [facility] on 03/10/2018 with a chief complaint of FUO  Assessment:  Bacteremia with MSSA (include suspected source if known)  Name of physician (or Provider) Contacted: Kennieth Rad  Current antibiotics: Vancomycin and Zosyn  Changes to prescribed antibiotics recommended:  Current antibiotics are likely to cover the isolated organism.  Consider de-escalation soon.  Results for orders placed or performed during the hospital encounter of 03/10/18  Blood Culture ID Panel (Reflexed) (Collected: 03/10/2018  6:21 AM)  Result Value Ref Range   Enterococcus species NOT DETECTED NOT DETECTED   Vancomycin resistance NOT DETECTED NOT DETECTED   Listeria monocytogenes NOT DETECTED NOT DETECTED   Staphylococcus species DETECTED (A) NOT DETECTED   Staphylococcus aureus (BCID) DETECTED (A) NOT DETECTED   Methicillin resistance NOT DETECTED NOT DETECTED   Streptococcus species NOT DETECTED NOT DETECTED   Streptococcus agalactiae NOT DETECTED NOT DETECTED   Streptococcus pneumoniae NOT DETECTED NOT DETECTED   Streptococcus pyogenes NOT DETECTED NOT DETECTED   Acinetobacter baumannii NOT DETECTED NOT DETECTED   Enterobacteriaceae species NOT DETECTED NOT DETECTED   Enterobacter cloacae complex NOT DETECTED NOT DETECTED   Escherichia coli NOT DETECTED NOT DETECTED   Klebsiella oxytoca NOT DETECTED NOT DETECTED   Klebsiella pneumoniae NOT DETECTED NOT DETECTED   Proteus species NOT DETECTED NOT DETECTED   Serratia marcescens NOT DETECTED NOT DETECTED   Carbapenem resistance NOT DETECTED NOT DETECTED   Haemophilus influenzae NOT DETECTED NOT DETECTED   Neisseria meningitidis NOT DETECTED NOT DETECTED   Pseudomonas aeruginosa NOT DETECTED NOT DETECTED   Candida albicans NOT DETECTED NOT DETECTED   Candida glabrata NOT DETECTED NOT DETECTED   Candida krusei NOT DETECTED NOT DETECTED   Candida parapsilosis NOT DETECTED NOT DETECTED   Candida tropicalis NOT DETECTED NOT DETECTED    Alanda Slim, PharmD, FCCM Clinical Pharmacist Please see AMION for all Pharmacists' Contact Phone Numbers 03/10/2018, 5:11 PM

## 2018-03-10 NOTE — Progress Notes (Signed)
Pharmacy Antibiotic Note  Karen Morrow is a 57 y.o. female admitted on 03/10/2018 with FUO.  Pharmacy has been consulted for vancomycin dosing. Flagyl ordered per MD. ESRD on HD TTS - missed last session 10/10 per ED notes. Received vancomycin 1g IV x 1 in the ED.  Plan: Vancomycin 750mg  IV qHD - not yet entered, f/u Renal plans for HD schedule Flagyl per MD Monitor clinical progress, c/s, abx plan/LOT Pre-HD vancomycin level as indicated F/u HD schedule/tolerance inpatient for antibiotic maintenance doses   Weight: 133 lb (60.3 kg)  Temp (24hrs), Avg:100 F (37.8 C), Min:99.3 F (37.4 C), Max:101.1 F (38.4 C)  Recent Labs  Lab 03/08/18 2033 03/08/18 2105 03/10/18 0509 03/10/18 0519  WBC 10.2  --  8.4  --   CREATININE 8.34*  --  11.91*  --   LATICACIDVEN  --  1.46  --  2.18*    Estimated Creatinine Clearance: 4.9 mL/min (A) (by C-G formula based on SCr of 11.91 mg/dL (H)).    Allergies  Allergen Reactions  . Cefazolin Other (See Comments)    Severe thrombocytopenia    Elicia Lamp, PharmD, BCPS Clinical Pharmacist Clinical phone [phone] Please check AMION for all [place] contact numbers 03/10/2018 8:46 AM

## 2018-03-10 NOTE — Progress Notes (Signed)
CRITICAL VALUE ALERT  Critical Value: lactic acid 3.6 and blood cultures gram pos cocci in clusters  Date & Time Notied: 03/10/18 1600  Provider Notified: Dr Pearline Cables  Orders Received/Actions taken: Monitoring.

## 2018-03-10 NOTE — ED Notes (Signed)
ED Provider at bedside. 

## 2018-03-10 NOTE — Consult Note (Signed)
[facility] Renal Consultation Note  Indication for Consultation:  Management of ESRD/hemodialysis; anemia, hypertension/volume and secondary hyperparathyroidism  HPI: Karen Morrow is a 57 y.o. female w hx of fevers and confusion x 48 hrs.  W/u in ED w/ CT abd and MRI t-spine no sig source of fever.  Asked to see for ESRD.    ON HD 4-5 years , ESRD due to HTN, no esrd in family.  Husband works here at [employer], pt found confused Wed night and again Thursday, very weak , difficulty walkjing , no n/v/d.  ABd pain Left/ mid abdomen, does not radiate.  Did vomit 2 days ago.  Not sure if on abx recently.  +SOB , chest pain, +yellow phlegm. Fistula no recednt problems.     Past Medical History:  Diagnosis Date  . Anginal pain ([place])   . Asthma   . Chronic kidney disease    dialysis 3x wk  . Dyspnea   . Frequent bowel movements   . GERD (gastroesophageal reflux disease)   . Hepatitis C antibody test positive   . Hypertension    "just dx'd today" (11/03/2012)    Past Surgical History:  Procedure Laterality Date  . ABDOMINAL HYSTERECTOMY     "partial" (11/03/2012)  . AV FISTULA PLACEMENT Right 12/03/2013   Procedure: CREATION OF ARTERIOVENOUS (AV) FISTULA RIGHT ARM ;  Surgeon: Rosetta Posner, MD;  Location: [place];  Service: Vascular;  Laterality: Right;  . BASCILIC VEIN TRANSPOSITION Right 03/06/2014   Procedure: SECOND STAGE BASILIC VEIN TRANSPOSITION;  Surgeon: Rosetta Posner, MD;  [facility];  Service: Vascular;  Laterality: Right;  . ESOPHAGOGASTRODUODENOSCOPY N/A 06/05/2013   Procedure: ESOPHAGOGASTRODUODENOSCOPY (EGD);  Surgeon: Winfield Cunas., MD;  Location: [facility];  Service: Endoscopy;  Laterality: N/A;  . ESOPHAGOGASTRODUODENOSCOPY N/A 09/08/2016   Procedure: ESOPHAGOGASTRODUODENOSCOPY (EGD);  Surgeon: Milus Banister, MD;  Location: [place];  Service: Endoscopy;  Laterality: N/A;  . REVISON OF ARTERIOVENOUS FISTULA Right 01/28/2017   Procedure: REVISON OF  RIGHT ARM ARTERIOVENOUS FISTULA USING 8MMX10CM GORETEX GRAFT;  Surgeon: Angelia Mould, MD;  Location[facility] OR;  Service: Vascular;  Laterality: Right;      Family History  Problem Relation Age of Onset  . Lupus Mother   . Diabetes Father   . Heart attack Father       reports that she has been smoking cigarettes. She has a 10.00 pack-year smoking history. She has never used smokeless tobacco. She reports that she drinks about 1.0 standard drinks of alcohol per week. She reports that she has current or past drug history. Drug: Marijuana.   Allergies  Allergen Reactions  . Cefazolin Other (See Comments)    Severe thrombocytopenia    Prior to Admission medications   Medication Sig Start Date End Date Taking? Authorizing Provider  albuterol (PROVENTIL HFA;VENTOLIN HFA) 108 (90 Base) MCG/ACT inhaler Inhale 2 puffs into the lungs every 6 (six) hours as needed for wheezing or shortness of breath. MUST MAKE APPT FOR FURTHER REFILLS 12/16/17   [name], MD  albuterol (PROVENTIL) (2.5 MG/3ML) 0.083% nebulizer solution Take 3 mLs (2.5 mg total) by nebulization every 6 (six) hours as needed for wheezing or shortness of breath. 11/24/17   Argentina Donovan, PA-C  amLODipine (NORVASC) 10 MG tablet Take 1 tablet (10 mg total) by mouth at bedtime. 11/23/17   Argentina Donovan, PA-C  calcium acetate (PHOSLO) 667 MG capsule Take 1,334 mg by mouth 3 (three) times  daily with meals.     [provider]  metoprolol tartrate (LOPRESSOR) 25 MG tablet Take 1 tablet (25 mg total) by mouth 2 (two) times daily. 11/23/17   Argentina Donovan, PA-C  Miconazole Nitrate Applicator (MONISTAT 7 COMBO PACK APP) 100 & 2 MG-% (9GM) KIT Place 1 Applicatorful vaginally daily. 03/09/18   Charlesetta Shanks, MD  nicotine polacrilex (NICORETTE) 4 MG gum Take 1 each (4 mg total) by mouth as needed for smoking cessation. Patient not taking: Reported on 11/23/2017 06/17/17   Alfonse Spruce, FNP  pantoprazole  (PROTONIX) 20 MG tablet Take 1 tablet (20 mg total) by mouth 2 (two) times daily before a meal. 11/23/17   Thereasa Solo, Dionne Bucy, PA-C  predniSONE (DELTASONE) 10 MG tablet Take 2 tablets (20 mg total) by mouth 2 (two) times daily with a meal. 01/02/18   Veryl Speak, MD      Results for orders placed or performed during the hospital encounter of 03/10/18 (from the past 48 hour(s))  Comprehensive metabolic panel     Status: Abnormal   Collection Time: 03/10/18  5:09 AM  Result Value Ref Range   Sodium 133 (L) 135 - 145 mmol/L   Potassium 6.2 (H) 3.5 - 5.1 mmol/L   Chloride 92 (L) 98 - 111 mmol/L   CO2 22 22 - 32 mmol/L   Glucose, Bld 105 (H) 70 - 99 mg/dL   BUN 59 (H) 6 - 20 mg/dL   Creatinine, Ser 11.91 (H) 0.44 - 1.00 mg/dL   Calcium 9.8 8.9 - 10.3 mg/dL   Total Protein 7.5 6.5 - 8.1 g/dL   Albumin 3.5 3.5 - 5.0 g/dL   AST 57 (H) 15 - 41 U/L   ALT 30 0 - 44 U/L   Alkaline Phosphatase 95 38 - 126 U/L   Total Bilirubin 1.2 0.3 - 1.2 mg/dL   GFR calc non Af Amer 3 (L) >60 mL/min   GFR calc Af Amer 4 (L) >60 mL/min    Comment: (NOTE) The eGFR has been calculated using the CKD EPI equation. This calculation has not been validated in all clinical situations. eGFR's persistently <60 mL/min signify possible Chronic Kidney Disease.    Anion gap 19 (H) 5 - 15    Comment: Performed at [facility], [place] [address], [place], [place] [address]  CBC with Differential     Status: Abnormal   Collection Time: 03/10/18  5:09 AM  Result Value Ref Range   WBC 8.4 4.0 - 10.5 K/uL    Comment: WHITE COUNT CONFIRMED ON SMEAR   RBC 3.63 (L) 3.87 - 5.11 MIL/uL   Hemoglobin 11.0 (L) 12.0 - 15.0 g/dL   HCT 33.4 (L) 36.0 - 46.0 %   MCV 92.0 80.0 - 100.0 fL   MCH 30.3 26.0 - 34.0 pg   MCHC 32.9 30.0 - 36.0 g/dL   RDW 13.4 11.5 - 15.5 %   Platelets 154 150 - 400 K/uL   nRBC 0.0 0.0 - 0.2 %   Neutrophils Relative % 92 %   Neutro Abs 7.7 1.7 - 7.7 K/uL   Lymphocytes Relative 3 %   Lymphs Abs 0.3 (L)  0.7 - 4.0 K/uL   Monocytes Relative 3 %   Monocytes Absolute 0.2 0.1 - 1.0 K/uL   Eosinophils Relative 1 %   Eosinophils Absolute 0.1 0.0 - 0.5 K/uL   Basophils Relative 1 %   Basophils Absolute 0.0 0.0 - 0.1 K/uL   WBC Morphology See  Note     Comment: Increased Bands. >20% Bands   Immature Granulocytes 0 %   Abs Immature Granulocytes 0.03 0.00 - 0.07 K/uL    Comment: Performed at [facility], [place] [address] [place], [place] [address]  Protime-INR     Status: None   Collection Time: 03/10/18  5:09 AM  Result Value Ref Range   Prothrombin Time 12.8 11.4 - 15.2 seconds   INR 0.97     Comment: Performed at [facility] [address] [place], [place] [address]  I-Stat CG4 Lactic Acid, ED     Status: Abnormal   Collection Time: 03/10/18  5:19 AM  Result Value Ref Range   Lactic Acid, Venous 2.18 (HH) 0.5 - 1.9 mmol/L   Comment NOTIFIED PHYSICIAN   I-Stat beta hCG blood, ED     Status: Abnormal   Collection Time: 03/10/18  5:29 AM  Result Value Ref Range   I-stat hCG, quantitative 7.6 (H) <5 mIU/mL   Comment 3            Comment:   GEST. AGE      CONC.  (mIU/mL)   <=1 WEEK        5 - 50     2 WEEKS       50 - 500     3 WEEKS       100 - 10,000     4 WEEKS     1,000 - 30,000        FEMALE AND NON-PREGNANT FEMALE:     LESS THAN 5 mIU/mL   CBG monitoring, ED     Status: None   Collection Time: 03/10/18  7:58 AM  Result Value Ref Range   Glucose-Capillary 77 70 - 99 mg/dL    ROS: see Hpi   Physical Exam: Vitals:   03/10/18 0700 03/10/18 0715  BP: (!) 152/86 (!) 170/101  Pulse: (!) 111 (!) 110  Resp: (!) 36 (!) 27  Temp:    SpO2: 99% 100%     General: normal Mouth dry mm's, EOMI, PERRLa Neck: no jvd no bruit Heart: Regular, tachy, 1/6 sem no rg Lungs: bilat lower lobe scattered rales and fine exp wheezing, no distress Abdomen: soft ntnd no mass or ascite +bs Extremities: no wounds or ulcers, trace edema Skin: no rash Neuro: NF, Ox3, a bit  weak Dialysis Access: RUA AVF+bruit  Dialysis Orders: TTS south .  3h 69mn  63.5kg  2/2.5 bath  Hep none   - hect 3 ug  - mircera 30 last on 10/8   Assessment/Plan 1. Fevers/ confusion - ox 3 now.  W/u in progress. Has rales bilat lungs w/ clear CXR could have viral URI/ pneumonitis? 2. ESRD -  TTS HD, missed HD yest. HD today short for ^K and reg HD tomorrow 3. Hypertension/volume  - no fluid excess on exam . BP's high 4. Anemia  Of ckd - cont esa when due 5. Metabolic bone disease -  Cont meds 6. Nutrition - renal diet  DErnest Haber PA-C CSneads [phone]/03/2018, 8:40 AM   Pt seen, examined and agree w A/P as above.  RKelly SplinterMD CNewell Rubbermaidpager [phone]  03/10/2018, 9:21 AM

## 2018-03-10 NOTE — Progress Notes (Signed)
While evaluating the patient at bedside, she was noted to be tachypneic with a respiratory rate of 40 on 2L [place]. She was responsive, but answered in one-word statements given her shortness of breath. She was unable to provide a history at this time. The patient was placed on Bipap with increase in her oxygenation to 100%. Her vitals on telemetry showed a HR in the 180s and a BP of 250/110. A repeat EKG was performed, which showed supraventricular tachycardia with pointed T waves and a rate of 181. Her previous EKG showed a rate of 150 with markedly peaked T waves. Her potassium on presentation was 6.2 and she received insulin with glucose for this in the ED. With the new EKG changes, a stat BMP and 1g calcium gluconate was ordered. 10u novolog and 1amp D50 given again. She was also given 10mg  labetalol, which resulted in a decrease in her BP to the 756E systolic and heart rate to the 150s. A repeat CXR showed interval development of pulmonary edema. She received 2.5L NS between the first and second CXRs for suspected sepsis. PCCM was consulted and the patient was moved to the ICU with plans for emergent dialysis at bedside.

## 2018-03-10 NOTE — ED Notes (Signed)
Patient placed on 2L O2 via n.c. for O2 sat of 92 on room air

## 2018-03-10 NOTE — Progress Notes (Signed)
Pharmacy Antibiotic Note  Karen Morrow is a 57 y.o. female admitted on 03/10/2018 with FUO.  Pharmacy has been consulted for Zosyn dosing.Vancomycin dosing already initiated. Patient is ESRD on HD TTS - missed last session 10/10 per ED notes. Received vancomycin 1g IV x 1 in the ED.  Plan: Zosyn 3.375gm IV q12h (EID) Vancomycin 750mg  IV qHD - not yet entered, f/u Renal plans for HD schedule Monitor clinical progress, c/s, abx plan/LOT Pre-HD vancomycin level as indicated F/u HD schedule/tolerance inpatient for antibiotic maintenance doses   Weight: 133 lb (60.3 kg)  Temp (24hrs), Avg:99.9 F (37.7 C), Min:99.3 F (37.4 C), Max:101.1 F (38.4 C)  Recent Labs  Lab 03/08/18 2033 03/08/18 2105 03/10/18 0509 03/10/18 0519 03/10/18 0841 03/10/18 0854 03/10/18 1219  WBC 10.2  --  8.4  --   --  7.8  --   CREATININE 8.34*  --  11.91*  --   --  12.02*  --   LATICACIDVEN  --  1.46  --  2.18* 3.04*  --  2.0*    Estimated Creatinine Clearance: 4.9 mL/min (A) (by C-G formula based on SCr of 12.02 mg/dL (H)).    Allergies  Allergen Reactions  . Cefazolin Other (See Comments)    Severe thrombocytopenia   Kieran Arreguin A. [name], PharmD, [place] Pager: [phone] Please utilize Amion for appropriate phone number to reach the unit pharmacist ([place])

## 2018-03-10 NOTE — ED Notes (Addendum)
pts husband at bedside, states he went to the store earlier and came home to find patient on the floor, pt denies any pain or known injuries from fall. Reports last dialysis session was Tuesday. Pt now reports some sob and lower abdominal pain-does not make urine anymore.

## 2018-03-10 NOTE — Progress Notes (Signed)
RT note- Patient transported to 4N, report given to therapist.

## 2018-03-10 NOTE — Consult Note (Addendum)
[facility]    Date of Admission:  03/10/2018   Total days of antibiotics: 1 vanco/zosyn               Reason for Consult: MSSA bacteremia    Referring Provider: CHAMP!   Assessment: Sepsis MSSA bacteremia AVG ESRD   Plan: 1. Would change her to ancef 2. Repeat BCx in AM 3. Needs TEE when stable.  4. U/s of AVG  Comment She has ancef allergy (she denies recollection of this) listed but has tolerated zosyn. She has sx of back pain and weakness but her MRIs have been negative.  Will follow with you.   Thank you so much for this interesting consult,  Active Problems:   ESRD (end stage renal disease) ([place])   Sepsis ([place])   Hyperkalemia   Acute respiratory distress   . Chlorhexidine Gluconate Cloth  6 each Topical Q0600  . heparin  5,000 Units Subcutaneous Q8H  . labetalol      . vancomycin variable dose per unstable renal function (pharmacist dosing)   Does not apply See admin instructions    HPI: JOURY ALLCORN is a 57 y.o. female with hx of ESRD, HTN previously seen in ED 10-10 with fatigue and weakness. She had ? Lumbosacral discitis on CT but refused MRI. She left ED under her own power.  She returns on 10-11 with fever and mental status change. She was found on floor by her husband, confused.  She was brought to ED and was found to have temp 101.1, hypertension, and WBC 8.4 and K 6.2. She had MRI T/L spine negative for infxn today.   She is now found to have BCx+ MSSA 2/4 bottles.   Review of Systems: Review of Systems  Unable to perform ROS: Acuity of condition  Currently on bipap  Past Medical History:  Diagnosis Date  . Anginal pain ([employer])   . Asthma   . Chronic kidney disease    dialysis 3x wk  . Dyspnea   . Frequent bowel movements   . GERD (gastroesophageal reflux disease)   . Hepatitis C antibody test positive   . Hypertension    "just dx'd today" (11/03/2012)    Social History   Tobacco Use  .  Smoking status: Light Tobacco Smoker    Packs/day: 0.50    Years: 20.00    Pack years: 10.00    Types: Cigarettes  . Smokeless tobacco: Never Used  Substance Use Topics  . Alcohol use: Yes    Alcohol/week: 1.0 standard drinks    Types: 1 Glasses of wine per week    Comment: 09/07/16-pt reports less than 1 glass of wine/week. 11/03/2012 "1-2 40oz beers/day maybe"   11/17/15 "I don;t drink beer anymore since Ive been on dialysis". p  . Drug use: Yes    Types: Marijuana    Comment: 11/03/2012 "maybe once or twice/wk" 11/17/15 "I haven't smoked in 3-4 months"    Family History  Problem Relation Age of Onset  . Lupus Mother   . Diabetes Father   . Heart attack Father      Medications:  Scheduled: . Chlorhexidine Gluconate Cloth  6 each Topical Q0600  . heparin  5,000 Units Subcutaneous Q8H  . labetalol      . vancomycin variable dose per unstable renal function (pharmacist dosing)   Does not apply See admin instructions    Abtx:  Anti-infectives (From admission, onward)  Start     Dose/Rate Route Frequency Ordered Stop   03/10/18 1415  piperacillin-tazobactam (ZOSYN) IVPB 3.375 g     3.375 g 12.5 mL/hr over 240 Minutes Intravenous Every 12 hours 03/10/18 1406     03/10/18 1406  vancomycin variable dose per unstable renal function (pharmacist dosing)      Does not apply See admin instructions 03/10/18 1407     03/10/18 0545  metroNIDAZOLE (FLAGYL) IVPB 500 mg  Status:  Discontinued     500 mg 100 mL/hr over 60 Minutes Intravenous Every 8 hours 03/10/18 0535 03/10/18 0956   03/10/18 0545  vancomycin (VANCOCIN) IVPB 1000 mg/200 mL premix     1,000 mg 200 mL/hr over 60 Minutes Intravenous  Once 03/10/18 0535 03/10/18 0643        OBJECTIVE: Blood pressure 110/90, pulse 98, temperature (!) 103.4 F (39.7 C), temperature source Rectal, resp. rate (!) 25, weight 63.8 kg, SpO2 98 %.  Physical Exam  Constitutional: She appears distressed.  Eyes: EOM are normal.  Neck: Neck  supple.  Cardiovascular: Tachycardia present.  Pulmonary/Chest: She has rhonchi.  Abdominal: Soft. Bowel sounds are normal. She exhibits no distension. There is no tenderness.  Musculoskeletal: She exhibits no edema.       Arms: Lymphadenopathy:    She has no cervical adenopathy.  Neurological: She is alert.  Skin: She is not diaphoretic.    Lab Results Results for orders placed or performed during the hospital encounter of 03/10/18 (from the past 48 hour(s))  Culture, blood (Routine x 2)     Status: None (Preliminary result)   Collection Time: 03/10/18  5:06 AM  Result Value Ref Range   Specimen Description BLOOD LEFT ANTECUBITAL    Special Requests      BOTTLES DRAWN AEROBIC AND ANAEROBIC Blood Culture results may not be optimal due to an inadequate volume of blood received in culture bottles   Culture  Setup Time      GRAM POSITIVE COCCI IN CLUSTERS AEROBIC BOTTLE ONLY CRITICAL VALUE NOTED.  VALUE IS CONSISTENT WITH PREVIOUSLY REPORTED AND CALLED VALUE. Performed at [facility], [place] [address], [place], [place] [address]    Culture GRAM POSITIVE COCCI    Report Status PENDING   Comprehensive metabolic panel     Status: Abnormal   Collection Time: 03/10/18  5:09 AM  Result Value Ref Range   Sodium 133 (L) 135 - 145 mmol/L   Potassium 6.2 (H) 3.5 - 5.1 mmol/L   Chloride 92 (L) 98 - 111 mmol/L   CO2 22 22 - 32 mmol/L   Glucose, Bld 105 (H) 70 - 99 mg/dL   BUN 59 (H) 6 - 20 mg/dL   Creatinine, Ser 11.91 (H) 0.44 - 1.00 mg/dL   Calcium 9.8 8.9 - 10.3 mg/dL   Total Protein 7.5 6.5 - 8.1 g/dL   Albumin 3.5 3.5 - 5.0 g/dL   AST 57 (H) 15 - 41 U/L   ALT 30 0 - 44 U/L   Alkaline Phosphatase 95 38 - 126 U/L   Total Bilirubin 1.2 0.3 - 1.2 mg/dL   GFR calc non Af Amer 3 (L) >60 mL/min   GFR calc Af Amer 4 (L) >60 mL/min    Comment: (NOTE) The eGFR has been calculated using the CKD EPI equation. This calculation has not been validated in all clinical situations. eGFR's  persistently <60 mL/min signify possible Chronic Kidney Disease.    Anion gap 19 (H) 5 - 15  Comment: Performed at [facility], [place] [address], [place], [place] [address]  CBC with Differential     Status: Abnormal   Collection Time: 03/10/18  5:09 AM  Result Value Ref Range   WBC 8.4 4.0 - 10.5 K/uL    Comment: WHITE COUNT CONFIRMED ON SMEAR   RBC 3.63 (L) 3.87 - 5.11 MIL/uL   Hemoglobin 11.0 (L) 12.0 - 15.0 g/dL   HCT 33.4 (L) 36.0 - 46.0 %   MCV 92.0 80.0 - 100.0 fL   MCH 30.3 26.0 - 34.0 pg   MCHC 32.9 30.0 - 36.0 g/dL   RDW 13.4 11.5 - 15.5 %   Platelets 154 150 - 400 K/uL   nRBC 0.0 0.0 - 0.2 %   Neutrophils Relative % 92 %   Neutro Abs 7.7 1.7 - 7.7 K/uL   Lymphocytes Relative 3 %   Lymphs Abs 0.3 (L) 0.7 - 4.0 K/uL   Monocytes Relative 3 %   Monocytes Absolute 0.2 0.1 - 1.0 K/uL   Eosinophils Relative 1 %   Eosinophils Absolute 0.1 0.0 - 0.5 K/uL   Basophils Relative 1 %   Basophils Absolute 0.0 0.0 - 0.1 K/uL   WBC Morphology See Note     Comment: Increased Bands. >20% Bands   Immature Granulocytes 0 %   Abs Immature Granulocytes 0.03 0.00 - 0.07 K/uL    Comment: Performed at [facility], [place] [address], [place], [place] [address]  Protime-INR     Status: None   Collection Time: 03/10/18  5:09 AM  Result Value Ref Range   Prothrombin Time 12.8 11.4 - 15.2 seconds   INR 0.97     Comment: Performed at [facility] [address], [place], [place] [address]  I-Stat CG4 Lactic Acid, ED     Status: Abnormal   Collection Time: 03/10/18  5:19 AM  Result Value Ref Range   Lactic Acid, Venous 2.18 (HH) 0.5 - 1.9 mmol/L   Comment NOTIFIED PHYSICIAN   I-Stat beta hCG blood, ED     Status: Abnormal   Collection Time: 03/10/18  5:29 AM  Result Value Ref Range   I-stat hCG, quantitative 7.6 (H) <5 mIU/mL   Comment 3            Comment:   GEST. AGE      CONC.  (mIU/mL)   <=1 WEEK        5 - 50     2 WEEKS       50 - 500     3 WEEKS       100 -  10,000     4 WEEKS     1,000 - 30,000        FEMALE AND NON-PREGNANT FEMALE:     LESS THAN 5 mIU/mL   Culture, blood (Routine x 2)     Status: None (Preliminary result)   Collection Time: 03/10/18  6:21 AM  Result Value Ref Range   Specimen Description BLOOD LEFT HAND    Special Requests      BOTTLES DRAWN AEROBIC AND ANAEROBIC Blood Culture results may not be optimal due to an inadequate volume of blood received in culture bottles   Culture  Setup Time      GRAM POSITIVE COCCI IN CLUSTERS ANAEROBIC BOTTLE ONLY CRITICAL RESULT CALLED TO, READ BACK BY AND VERIFIED WITH: C. PIERCE, PHARMD AT 1705 ON 03/10/18 BY C. JESSUP, MLT. Performed at [facility], [address] [place], [place] [address]    Culture GRAM POSITIVE COCCI    Report Status PENDING   Blood Culture ID Panel (Reflexed)     Status: Abnormal   Collection Time: 03/10/18  6:21 AM  Result Value Ref Range   Enterococcus species NOT DETECTED NOT DETECTED   Vancomycin resistance NOT DETECTED NOT DETECTED   Listeria monocytogenes NOT DETECTED NOT DETECTED   Staphylococcus species DETECTED (A) NOT DETECTED    Comment: CRITICAL RESULT CALLED TO, READ BACK BY AND VERIFIED WITH: C. PIERCE, PHARMD AT 1705 ON 03/10/18 BY C. JESSUP, MLT.    Staphylococcus aureus (BCID) DETECTED (A) NOT DETECTED    Comment: Methicillin (oxacillin) susceptible Staphylococcus aureus (MSSA). Preferred therapy is anti staphylococcal beta lactam antibiotic (Cefazolin or Nafcillin), unless clinically contraindicated. CRITICAL RESULT CALLED TO, READ BACK BY AND VERIFIED WITH: C. PIERCE, PHARMD AT 1705 ON 03/10/18 BY C. JESSUP, MLT.    Methicillin resistance NOT DETECTED NOT DETECTED   Streptococcus species NOT DETECTED NOT DETECTED   Streptococcus agalactiae NOT DETECTED NOT DETECTED   Streptococcus pneumoniae NOT DETECTED NOT DETECTED   Streptococcus pyogenes NOT DETECTED NOT DETECTED   Acinetobacter baumannii NOT DETECTED NOT DETECTED    Enterobacteriaceae species NOT DETECTED NOT DETECTED   Enterobacter cloacae complex NOT DETECTED NOT DETECTED   Escherichia coli NOT DETECTED NOT DETECTED   Klebsiella oxytoca NOT DETECTED NOT DETECTED   Klebsiella pneumoniae NOT DETECTED NOT DETECTED   Proteus species NOT DETECTED NOT DETECTED   Serratia marcescens NOT DETECTED NOT DETECTED   Carbapenem resistance NOT DETECTED NOT DETECTED   Haemophilus influenzae NOT DETECTED NOT DETECTED   Neisseria meningitidis NOT DETECTED NOT DETECTED   Pseudomonas aeruginosa NOT DETECTED NOT DETECTED   Candida albicans NOT DETECTED NOT DETECTED   Candida glabrata NOT DETECTED NOT DETECTED   Candida krusei NOT DETECTED NOT DETECTED   Candida parapsilosis NOT DETECTED NOT DETECTED   Candida tropicalis NOT DETECTED NOT DETECTED    Comment: Performed at [facility] [address] [place], [place] [address]  CBG monitoring, ED     Status: None   Collection Time: 03/10/18  7:58 AM  Result Value Ref Range   Glucose-Capillary 77 70 - 99 mg/dL  I-Stat CG4 Lactic Acid, ED     Status: Abnormal   Collection Time: 03/10/18  8:41 AM  Result Value Ref Range   Lactic Acid, Venous 3.04 (HH) 0.5 - 1.9 mmol/L   Comment NOTIFIED PHYSICIAN   Procalcitonin     Status: None   Collection Time: 03/10/18  8:54 AM  Result Value Ref Range   Procalcitonin 17.63 ng/mL    Comment:        Interpretation: PCT >= 10 ng/mL: Important systemic inflammatory response, almost exclusively due to severe bacterial sepsis or septic shock. (NOTE)       Sepsis PCT Algorithm           Lower Respiratory Tract                                      Infection PCT Algorithm    ----------------------------     ----------------------------         PCT < 0.25 ng/mL                PCT < 0.10 ng/mL         Strongly encourage  Strongly discourage   discontinuation of antibiotics    initiation of antibiotics    ----------------------------      -----------------------------       PCT 0.25 - 0.50 ng/mL            PCT 0.10 - 0.25 ng/mL               OR       >80% decrease in PCT            Discourage initiation of                                            antibiotics      Encourage discontinuation           of antibiotics    ----------------------------     -----------------------------         PCT >= 0.50 ng/mL              PCT 0.26 - 0.50 ng/mL                AND       <80% decrease in PCT             Encourage initiation of                                             antibiotics       Encourage continuation           of antibiotics    ----------------------------     -----------------------------        PCT >= 0.50 ng/mL                  PCT > 0.50 ng/mL               AND         increase in PCT                  Strongly encourage                                      initiation of antibiotics    Strongly encourage escalation           of antibiotics                                     -----------------------------                                           PCT <= 0.25 ng/mL                                                 OR                                        >  80% decrease in PCT                                     Discontinue / Do not initiate                                             antibiotics Performed at [facility], [place] [address], [place], [place] [address]   CBC     Status: Abnormal   Collection Time: 03/10/18  8:54 AM  Result Value Ref Range   WBC 7.8 4.0 - 10.5 K/uL   RBC 3.15 (L) 3.87 - 5.11 MIL/uL   Hemoglobin 9.7 (L) 12.0 - 15.0 g/dL   HCT 29.9 (L) 36.0 - 46.0 %   MCV 94.9 80.0 - 100.0 fL   MCH 30.8 26.0 - 34.0 pg   MCHC 32.4 30.0 - 36.0 g/dL   RDW 13.7 11.5 - 15.5 %   Platelets 125 (L) 150 - 400 K/uL   nRBC 0.0 0.0 - 0.2 %    Comment: Performed at [facility], [place] [address], [place], [place] [address]  Creatinine, serum     Status: Abnormal   Collection Time: 03/10/18  8:54  AM  Result Value Ref Range   Creatinine, Ser 12.02 (H) 0.44 - 1.00 mg/dL   GFR calc non Af Amer 3 (L) >60 mL/min   GFR calc Af Amer 3 (L) >60 mL/min    Comment: (NOTE) The eGFR has been calculated using the CKD EPI equation. This calculation has not been validated in all clinical situations. eGFR's persistently <60 mL/min signify possible Chronic Kidney Disease. Performed at [facility], [place] [address], [place], [place] [address]   MRSA PCR Screening     Status: None   Collection Time: 03/10/18 10:53 AM  Result Value Ref Range   MRSA by PCR NEGATIVE NEGATIVE    Comment:        The GeneXpert MRSA Assay (FDA approved for NASAL specimens only), is one component of a comprehensive MRSA colonization surveillance program. It is not intended to diagnose MRSA infection nor to guide or monitor treatment for MRSA infections. Performed at [facility], [place] [address], [place], [place] [address]   Lactic acid, plasma     Status: Abnormal   Collection Time: 03/10/18 12:19 PM  Result Value Ref Range   Lactic Acid, Venous 2.0 (HH) 0.5 - 1.9 mmol/L    Comment: CRITICAL RESULT CALLED TO, READ BACK BY AND VERIFIED WITH: S.DUFFY,RN 03/10/18 1320 DAVISB Performed at [facility], [place] [address] [place], [place] [address]   Sedimentation rate     Status: None   Collection Time: 03/10/18 12:19 PM  Result Value Ref Range   Sed Rate 21 0 - 22 mm/hr    Comment: Performed at [facility] [address], [place], [place] [address]  C-reactive protein     Status: Abnormal   Collection Time: 03/10/18 12:19 PM  Result Value Ref Range   CRP 3.4 (H) <1.0 mg/dL    Comment: Performed at [facility] [address] [place],  [address]  Blood gas, arterial     Status: Abnormal   Collection Time: 03/10/18  1:01 PM  Result Value Ref Range  pH, Arterial 7.265 (L) 7.350 - 7.450   pCO2 arterial 45.6 32.0 - 48.0 mmHg   pO2, Arterial 225 (H) 83.0 - 108.0 mmHg    Bicarbonate 20.0 20.0 - 28.0 mmol/L   Acid-base deficit 5.8 (H) 0.0 - 2.0 mmol/L   O2 Saturation 98.1 %   Patient temperature 98.6    Collection site RADIAL    Drawn by [phone]    Sample type ARTERIAL   Glucose, capillary     Status: Abnormal   Collection Time: 03/10/18  1:31 PM  Result Value Ref Range   Glucose-Capillary 101 (H) 70 - 99 mg/dL  Glucose, capillary     Status: Abnormal   Collection Time: 03/10/18  2:21 PM  Result Value Ref Range   Glucose-Capillary 149 (H) 70 - 99 mg/dL  Lactic acid, plasma     Status: Abnormal   Collection Time: 03/10/18  2:39 PM  Result Value Ref Range   Lactic Acid, Venous 3.6 (HH) 0.5 - 1.9 mmol/L    Comment: CRITICAL RESULT CALLED TO, READ BACK BY AND VERIFIED WITH: Juanetta Gosling RN [phone] 1444 GREEN R Performed at [facility], [place] [address] [place], [place] [address]   Basic metabolic panel     Status: Abnormal   Collection Time: 03/10/18  2:39 PM  Result Value Ref Range   Sodium 133 (L) 135 - 145 mmol/L   Potassium 5.3 (H) 3.5 - 5.1 mmol/L   Chloride 97 (L) 98 - 111 mmol/L   CO2 20 (L) 22 - 32 mmol/L   Glucose, Bld 109 (H) 70 - 99 mg/dL   BUN 71 (H) 6 - 20 mg/dL   Creatinine, Ser 12.68 (H) 0.44 - 1.00 mg/dL   Calcium 9.0 8.9 - 10.3 mg/dL   GFR calc non Af Amer 3 (L) >60 mL/min   GFR calc Af Amer 3 (L) >60 mL/min    Comment: (NOTE) The eGFR has been calculated using the CKD EPI equation. This calculation has not been validated in all clinical situations. eGFR's persistently <60 mL/min signify possible Chronic Kidney Disease.    Anion gap 16 (H) 5 - 15    Comment: Performed at [facility], [place] [address], [place], [place] [address]  Occult blood card to lab, stool RN will collect     Status: Abnormal   Collection Time: 03/10/18  3:15 PM  Result Value Ref Range   Fecal Occult Bld POSITIVE (A) NEGATIVE    Comment: Performed at [facility] [address], [place],  [address]      Component Value Date/Time     SDES BLOOD LEFT HAND 03/10/2018 8003   SPECREQUEST  03/10/2018 4917    BOTTLES DRAWN AEROBIC AND ANAEROBIC Blood Culture results may not be optimal due to an inadequate volume of blood received in culture bottles   CULT GRAM POSITIVE COCCI 03/10/2018 9150   REPTSTATUS PENDING 03/10/2018 5697   Dg Chest 1 View  Result Date: 03/10/2018 CLINICAL DATA:  Initial evaluation for tachypnea. EXAM: CHEST  1 VIEW COMPARISON:  Prior radiograph from earlier the same day. FINDINGS: Advanced cardiomegaly, stable. Mediastinal silhouette normal. Aortic atherosclerosis. Lungs mildly hypoinflated, with motion artifact at the right hemidiaphragm increased pulmonary vascular congestion with interstitial prominence, consistent with new/developing pulmonary interstitial edema. Scattered Kerley B-lines present at the lung bases. Small right pleural effusion. Superimposed mild right basilar atelectasis. No consolidative opacity. No pneumothorax. No acute osseous abnormality. IMPRESSION: 1. Interval development of diffuse pulmonary interstitial edema  with small right pleural effusion. 2. Underlying cardiomegaly with aortic atherosclerosis. Electronically Signed   By: Jeannine Boga M.D.   On: 03/10/2018 13:32   Dg Chest 2 View  Result Date: 03/09/2018 CLINICAL DATA:  Fever EXAM: CHEST - 2 VIEW COMPARISON:  01/01/2018 FINDINGS: Cardiac enlargement. No vascular congestion, edema, or consolidation. Hyperinflation may represent emphysema or asthma. No airspace disease or consolidation in the lungs. No blunting of costophrenic angles. No pneumothorax. Mediastinal contours appear intact. Calcified and tortuous aorta. IMPRESSION: Hyperinflation. Cardiac enlargement. No evidence of active pulmonary disease. Electronically Signed   By: Lucienne Capers M.D.   On: 03/09/2018 03:21   Mr Thoracic Spine Wo Contrast  Result Date: 03/09/2018 CLINICAL DATA:  Thoracic spine pain and bilateral lower extremity weakness since a fall  yesterday. End stage renal disease. Initial encounter. EXAM: MRI THORACIC SPINE WITHOUT CONTRAST TECHNIQUE: Multiplanar, multisequence MR imaging of the thoracic spine was performed. No intravenous contrast was administered. COMPARISON:  CT abdomen and pelvis this same day. CT abdomen and pelvis and PA and lateral chest this same day. FINDINGS: Alignment:  Normal. Vertebrae: There is no fracture. Marrow signal is diffusely heterogeneous. While this finding cannot be definitively characterized, it is likely related to the patient's end-stage renal disease. The appearance is not typical of a neoplastic process. Cord:  Normal signal throughout. Paraspinal and other soft tissues: Trace right pleural effusion is noted. Disc levels: The central spinal canal and neural foramina are widely patent at all levels. Tiny central disc protrusion at T7-8 is noted. Intervertebral disc spaces are otherwise unremarkable. IMPRESSION: Negative for fracture or other acute abnormality. Diffusely heterogeneous marrow signal pattern is likely related to end-stage renal disease. Widely patent central canal and foramina at all levels. Electronically Signed   By: Inge Rise M.D.   On: 03/09/2018 09:34   Mr Lumbar Spine Wo Contrast  Result Date: 03/10/2018 CLINICAL DATA:  57 year old female with weakness. Difficulty walking. Fever of unknown origin, and questionable L5-S1 discitis on CT Abdomen and Pelvis . In stage renal disease on dialysis. EXAM: MRI LUMBAR SPINE WITHOUT CONTRAST TECHNIQUE: Multiplanar, multisequence MR imaging of the lumbar spine was performed. No intravenous contrast was administered. COMPARISON:  CT Abdomen and Pelvis 03/09/2018 and earlier. FINDINGS: Segmentation:  Normal as seen on the CT comparison. Alignment:  Stable since 2014 with preserved lumbar lordosis. Vertebrae: Diffusely abnormal background bone marrow signal, a especially in the lower thoracic and lumbar levels through L4. At L5 and in the visible  sacrum, pelvis marrow signal is heterogeneous but with better preserved T1 hyperintensity. Still, there is no marrow edema or evidence of acute osseous abnormality. Intact visible sacrum. Conus medullaris and cauda equina: Conus extends to the L1 level. No lower spinal cord or conus signal abnormality. Capacious spinal canal. Paraspinal and other soft tissues: Atrophied kidneys. Decreased T2 signal in the visible liver and spleen. Partially visible cystic structure in the right hemipelvis, probably ovary related and simple appearing. There is a small focus of edema in the medial left psoas muscle at the L4 level as seen on series 3, images 13 and 14. This correlates to axial series 5, images 27-30 where the muscle appears to remain largely normal. No fluid collection. There is no other paraspinal inflammation. Disc levels: T11-T12: Disc desiccation with mild disc space loss, disc bulge and anterior endplate spurring. No stenosis. T12-L1 through L4-L5: Negative. L5-S1: Chronic severe disc space loss here, vacuum disc in 2014 which was not apparent yesterday. Circumferential disc osteophyte complex  with broad-based posterior and biforaminal involvement. Superimposed epidural lipomatosis. No edema within the epidural fat. Moderate to severe bilateral L5 foraminal stenosis. IMPRESSION: 1. Advanced chronic disc and endplate degeneration at L5-S1 but no evidence of discitis osteomyelitis. 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 2. There is a small area of nonspecific inflammation in the medial left psoas muscle at L4, but there is no evidence of spinal infection. 3. Capacious spinal canal with no degenerative spinal stenosis. There is degenerative bilateral L5 neural foraminal stenosis. Electronically Signed   By: Genevie Ann M.D.   On: 03/10/2018 11:37   Ct Abdomen Pelvis W Contrast  Result Date: 03/09/2018 CLINICAL DATA:  Fall. Weakness and  difficulty walking. EXAM: CT ABDOMEN AND PELVIS WITH CONTRAST TECHNIQUE: Multidetector CT imaging of the abdomen and pelvis was performed using the standard protocol following bolus administration of intravenous contrast. CONTRAST:  149m OMNIPAQUE IOHEXOL 300 MG/ML  SOLN COMPARISON:  11/13/2009 FINDINGS: Lower chest: Lung bases are clear. Mild right pleural thickening. Hepatobiliary: Mild diffuse fatty infiltration of the liver. No focal lesions. Gallbladder and bile ducts are unremarkable. Pancreas: Unremarkable. No pancreatic ductal dilatation or surrounding inflammatory changes. Spleen: Normal in size without focal abnormality. Adrenals/Urinary Tract: No adrenal gland nodules. Diffuse bilateral renal parenchymal atrophy, representing significant progression since previous study. Delayed appearance of contrast material in the renal collecting systems consistent with chronic renal insufficiency. No hydronephrosis or hydroureter. Bladder wall is not thickened. Stomach/Bowel: Stomach, small bowel, and colon are mostly decompressed. No inflammatory infiltration is identified. Appendix is normal. Vascular/Lymphatic: Aortic atherosclerosis. No enlarged abdominal or pelvic lymph nodes. Reproductive: Status post hysterectomy. No adnexal masses. Other: No free air or free fluid in the abdomen. Abdominal wall musculature appears intact. Infiltration in the subcutaneous fat over the low pelvic and vulvar region may represent cellulitis. No abscess. Musculoskeletal: Disc space narrowing and sclerosis at the lumbosacral interspace with loss of cortex at the endplates. This has developed since previous study. Likely severe degenerative changes. Discitis not entirely excluded. Consider MRI for further evaluation if this is a clinical concern. IMPRESSION: 1. Mild diffuse fatty infiltration of the liver. 2. Diffuse bilateral renal parenchymal atrophy consistent with chronic renal insufficiency. New since previous study. No  hydronephrosis or hydroureter. 3. Infiltration in the subcutaneous fat over the low pelvic and vulvar region may indicate cellulitis. No abscess. 4. Severe degenerative changes at the lumbosacral interspace. Discitis would possibly have this appearance. Consider MRI for further evaluation if this is a clinical concern. Aortic Atherosclerosis (ICD10-I70.0). Electronically Signed   By: WLucienne CapersM.D.   On: 03/09/2018 04:36   Dg Chest Port 1 View  Result Date: 03/10/2018 CLINICAL DATA:  Fever and sepsis EXAM: PORTABLE CHEST 1 VIEW COMPARISON:  Chest radiograph 03/09/2018 FINDINGS: Severe cardiomegaly. No focal airspace consolidation or pulmonary edema. No pneumothorax or sizable pleural effusion. IMPRESSION: Severe cardiomegaly without pulmonary edema. No focal consolidation. Electronically Signed   By: KUlyses JarredM.D.   On: 03/10/2018 05:48   Recent Results (from the past 240 hour(s))  Culture, blood (Routine x 2)     Status: None (Preliminary result)   Collection Time: 03/10/18  5:06 AM  Result Value Ref Range Status   Specimen Description BLOOD LEFT ANTECUBITAL  Final   Special Requests   Final    BOTTLES DRAWN AEROBIC AND ANAEROBIC Blood Culture results may not be optimal due to an inadequate volume of blood received in culture bottles   Culture  Setup Time   Final    GRAM POSITIVE COCCI IN CLUSTERS AEROBIC BOTTLE ONLY CRITICAL VALUE NOTED.  VALUE IS CONSISTENT WITH PREVIOUSLY REPORTED AND CALLED VALUE. Performed at [facility], [place] [address], [place], [place] [address]    Culture GRAM POSITIVE COCCI  Final   Report Status PENDING  Incomplete  Culture, blood (Routine x 2)     Status: None (Preliminary result)   Collection Time: 03/10/18  6:21 AM  Result Value Ref Range Status   Specimen Description BLOOD LEFT HAND  Final   Special Requests   Final    BOTTLES DRAWN AEROBIC AND ANAEROBIC Blood Culture results may not be optimal due to an inadequate volume of blood received  in culture bottles   Culture  Setup Time   Final    GRAM POSITIVE COCCI IN CLUSTERS ANAEROBIC BOTTLE ONLY CRITICAL RESULT CALLED TO, READ BACK BY AND VERIFIED WITH: C. PIERCE, PHARMD AT 1705 ON 03/10/18 BY C. JESSUP, MLT. Performed at [facility], [place] [address] [place], [place] [address]    Culture GRAM POSITIVE COCCI  Final   Report Status PENDING  Incomplete  Blood Culture ID Panel (Reflexed)     Status: Abnormal   Collection Time: 03/10/18  6:21 AM  Result Value Ref Range Status   Enterococcus species NOT DETECTED NOT DETECTED Final   Vancomycin resistance NOT DETECTED NOT DETECTED Final   Listeria monocytogenes NOT DETECTED NOT DETECTED Final   Staphylococcus species DETECTED (A) NOT DETECTED Final    Comment: CRITICAL RESULT CALLED TO, READ BACK BY AND VERIFIED WITH: C. PIERCE, PHARMD AT 1705 ON 03/10/18 BY C. JESSUP, MLT.    Staphylococcus aureus (BCID) DETECTED (A) NOT DETECTED Final    Comment: Methicillin (oxacillin) susceptible Staphylococcus aureus (MSSA). Preferred therapy is anti staphylococcal beta lactam antibiotic (Cefazolin or Nafcillin), unless clinically contraindicated. CRITICAL RESULT CALLED TO, READ BACK BY AND VERIFIED WITH: C. PIERCE, PHARMD AT 1705 ON 03/10/18 BY C. JESSUP, MLT.    Methicillin resistance NOT DETECTED NOT DETECTED Final   Streptococcus species NOT DETECTED NOT DETECTED Final   Streptococcus agalactiae NOT DETECTED NOT DETECTED Final   Streptococcus pneumoniae NOT DETECTED NOT DETECTED Final   Streptococcus pyogenes NOT DETECTED NOT DETECTED Final   Acinetobacter baumannii NOT DETECTED NOT DETECTED Final   Enterobacteriaceae species NOT DETECTED NOT DETECTED Final   Enterobacter cloacae complex NOT DETECTED NOT DETECTED Final   Escherichia coli NOT DETECTED NOT DETECTED Final   Klebsiella oxytoca NOT DETECTED NOT DETECTED Final   Klebsiella pneumoniae NOT DETECTED NOT DETECTED Final   Proteus species NOT DETECTED NOT DETECTED Final     Serratia marcescens NOT DETECTED NOT DETECTED Final   Carbapenem resistance NOT DETECTED NOT DETECTED Final   Haemophilus influenzae NOT DETECTED NOT DETECTED Final   Neisseria meningitidis NOT DETECTED NOT DETECTED Final   Pseudomonas aeruginosa NOT DETECTED NOT DETECTED Final   Candida albicans NOT DETECTED NOT DETECTED Final   Candida glabrata NOT DETECTED NOT DETECTED Final   Candida krusei NOT DETECTED NOT DETECTED Final   Candida parapsilosis NOT DETECTED NOT DETECTED Final   Candida tropicalis NOT DETECTED NOT DETECTED Final    Comment: Performed at [facility], [place] [address] [place], [place] [address]  MRSA PCR Screening     Status: None   Collection Time: 03/10/18 10:53 AM  Result Value Ref Range Status   MRSA by PCR NEGATIVE NEGATIVE Final    Comment:  The GeneXpert MRSA Assay (FDA approved for NASAL specimens only), is one component of a comprehensive MRSA colonization surveillance program. It is not intended to diagnose MRSA infection nor to guide or monitor treatment for MRSA infections. Performed at [facility], [place] [address], [place], [place] [address]     Microbiology: Recent Results (from the past 240 hour(s))  Culture, blood (Routine x 2)     Status: None (Preliminary result)   Collection Time: 03/10/18  5:06 AM  Result Value Ref Range Status   Specimen Description BLOOD LEFT ANTECUBITAL  Final   Special Requests   Final    BOTTLES DRAWN AEROBIC AND ANAEROBIC Blood Culture results may not be optimal due to an inadequate volume of blood received in culture bottles   Culture  Setup Time   Final    GRAM POSITIVE COCCI IN CLUSTERS AEROBIC BOTTLE ONLY CRITICAL VALUE NOTED.  VALUE IS CONSISTENT WITH PREVIOUSLY REPORTED AND CALLED VALUE. Performed at [facility], [place] [address] [place], [place] [address]    Culture GRAM POSITIVE COCCI  Final   Report Status PENDING  Incomplete  Culture, blood (Routine x 2)     Status: None  (Preliminary result)   Collection Time: 03/10/18  6:21 AM  Result Value Ref Range Status   Specimen Description BLOOD LEFT HAND  Final   Special Requests   Final    BOTTLES DRAWN AEROBIC AND ANAEROBIC Blood Culture results may not be optimal due to an inadequate volume of blood received in culture bottles   Culture  Setup Time   Final    GRAM POSITIVE COCCI IN CLUSTERS ANAEROBIC BOTTLE ONLY CRITICAL RESULT CALLED TO, READ BACK BY AND VERIFIED WITH: C. PIERCE, PHARMD AT 1705 ON 03/10/18 BY C. JESSUP, MLT. Performed at [facility], [place] [address] [place], [place] [address]    Culture GRAM POSITIVE COCCI  Final   Report Status PENDING  Incomplete  Blood Culture ID Panel (Reflexed)     Status: Abnormal   Collection Time: 03/10/18  6:21 AM  Result Value Ref Range Status   Enterococcus species NOT DETECTED NOT DETECTED Final   Vancomycin resistance NOT DETECTED NOT DETECTED Final   Listeria monocytogenes NOT DETECTED NOT DETECTED Final   Staphylococcus species DETECTED (A) NOT DETECTED Final    Comment: CRITICAL RESULT CALLED TO, READ BACK BY AND VERIFIED WITH: C. PIERCE, PHARMD AT 1705 ON 03/10/18 BY C. JESSUP, MLT.    Staphylococcus aureus (BCID) DETECTED (A) NOT DETECTED Final    Comment: Methicillin (oxacillin) susceptible Staphylococcus aureus (MSSA). Preferred therapy is anti staphylococcal beta lactam antibiotic (Cefazolin or Nafcillin), unless clinically contraindicated. CRITICAL RESULT CALLED TO, READ BACK BY AND VERIFIED WITH: C. PIERCE, PHARMD AT 1705 ON 03/10/18 BY C. JESSUP, MLT.    Methicillin resistance NOT DETECTED NOT DETECTED Final   Streptococcus species NOT DETECTED NOT DETECTED Final   Streptococcus agalactiae NOT DETECTED NOT DETECTED Final   Streptococcus pneumoniae NOT DETECTED NOT DETECTED Final   Streptococcus pyogenes NOT DETECTED NOT DETECTED Final   Acinetobacter baumannii NOT DETECTED NOT DETECTED Final   Enterobacteriaceae species NOT DETECTED NOT  DETECTED Final   Enterobacter cloacae complex NOT DETECTED NOT DETECTED Final   Escherichia coli NOT DETECTED NOT DETECTED Final   Klebsiella oxytoca NOT DETECTED NOT DETECTED Final   Klebsiella pneumoniae NOT DETECTED NOT DETECTED Final   Proteus species NOT DETECTED NOT DETECTED Final   Serratia marcescens NOT DETECTED NOT DETECTED Final  Carbapenem resistance NOT DETECTED NOT DETECTED Final   Haemophilus influenzae NOT DETECTED NOT DETECTED Final   Neisseria meningitidis NOT DETECTED NOT DETECTED Final   Pseudomonas aeruginosa NOT DETECTED NOT DETECTED Final   Candida albicans NOT DETECTED NOT DETECTED Final   Candida glabrata NOT DETECTED NOT DETECTED Final   Candida krusei NOT DETECTED NOT DETECTED Final   Candida parapsilosis NOT DETECTED NOT DETECTED Final   Candida tropicalis NOT DETECTED NOT DETECTED Final    Comment: Performed at [facility], [place] [address], [place], [place] [address]  MRSA PCR Screening     Status: None   Collection Time: 03/10/18 10:53 AM  Result Value Ref Range Status   MRSA by PCR NEGATIVE NEGATIVE Final    Comment:        The GeneXpert MRSA Assay (FDA approved for NASAL specimens only), is one component of a comprehensive MRSA colonization surveillance program. It is not intended to diagnose MRSA infection nor to guide or monitor treatment for MRSA infections. Performed at [facility], [place] [address] [place], [place] [address]     Radiographs and labs were personally reviewed by me.   Bobby Rumpf, MD [facility] [phone] 03/10/2018, 6:36 PM

## 2018-03-10 NOTE — Progress Notes (Signed)
Pharmacy Antibiotic Note  Karen Morrow is a 57 y.o. female admitted on 03/10/2018 with bacteremia.  Pharmacy has been consulted for Cefazolin dosing.  Plan: Cefazolin 1 gram IV q24hr  Weight: 140 lb 10.5 oz (63.8 kg)  Temp (24hrs), Avg:101.3 F (38.5 C), Min:99.3 F (37.4 C), Max:103.4 F (39.7 C)  Recent Labs  Lab 03/08/18 2033 03/08/18 2105 03/10/18 0509 03/10/18 0519 03/10/18 0841 03/10/18 0854 03/10/18 1219 03/10/18 1439  WBC 10.2  --  8.4  --   --  7.8  --   --   CREATININE 8.34*  --  11.91*  --   --  12.02*  --  12.68*  LATICACIDVEN  --  1.46  --  2.18* 3.04*  --  2.0* 3.6*    Estimated Creatinine Clearance: 4.7 mL/min (A) (by C-G formula based on SCr of 12.68 mg/dL (H)).    Allergies  Allergen Reactions  . Cefazolin Other (See Comments)    Severe thrombocytopenia    Antimicrobials this admission: Vanc 10/11 >> 10/11 Zosyn 10/11 >> 10/11 Cefazolin 10/11 >>  Microbiology results: 10/11 BCx x 2: MSSA  Thank you for allowing pharmacy to be a part of this patient's care.   Alanda Slim, PharmD, [facility] Clinical Pharmacist Please see AMION for all Pharmacists' Contact Phone Numbers 03/10/2018, 7:18 PM

## 2018-03-10 NOTE — ED Provider Notes (Signed)
[place] EMERGENCY DEPARTMENT Provider Note   CSN: [id number] Arrival date & time: 03/10/18  0459     History   Chief Complaint Chief Complaint  Patient presents with  . Fever  . Altered Mental Status   Level 5 caveat due to acuity of condition HPI Karen Morrow is a 57 y.o. female.  The history is provided by the patient and the spouse. The history is limited by the condition of the patient.  Fever   This is a new problem. The problem occurs constantly. The problem has been rapidly worsening. Associated symptoms include diarrhea and vomiting. Pertinent negatives include no headaches. She has tried nothing for the symptoms.  Altered Mental Status    Patient with history of chronic kidney disease on dialysis, history of hypertension presents with fever and altered mental status.  Husband reports he found her laying on the floor and she was acting confused.  She also reported shortness of breath, and she reports vomiting and diarrhea. She denies any headache at this time.  No chest or abdominal pain is reported to my exam.  No back pain.  She receives dialysis Tuesday/Thursday/Saturday, she missed her most recent session.  Past Medical History:  Diagnosis Date  . Anginal pain ([place])   . Asthma   . Chronic kidney disease    dialysis 3x wk  . Dyspnea   . Frequent bowel movements   . GERD (gastroesophageal reflux disease)   . Hepatitis C antibody test positive   . Hypertension    "just dx'd today" (11/03/2012)    Patient Active Problem List   Diagnosis Date Noted  . Pulmonary hypertension due to left ventricular diastolic dysfunction ([place]) 11/08/2016  . Chronic cough 10/15/2016  . Acute GI bleeding 09/07/2016  . Gastroesophageal reflux disease 04/16/2016  . Anemia in chronic kidney disease, on chronic dialysis ([place]) 04/16/2016  . Primary insomnia 04/16/2016  . Shortness of breath 04/16/2016  . Pruritus 12/11/2014  . Hordeolum externum (stye)  12/11/2014  . Xanthelasma of right upper eyelid 12/11/2014  . Dermatitis 10/30/2014  . End stage renal disease on dialysis ([place]) 02/25/2014  . Malnutrition of moderate degree ([place]) 11/28/2013  . HTN (hypertension) 11/26/2013  . Gastric ulcer 06/07/2013  . Chest pain 11/03/2012  . Thrombocytopenia ([place]) 11/03/2012  . Elevated transaminase level 11/03/2012  . 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 Hepatitis C antibody test positive     Past Surgical History:  Procedure Laterality Date  . ABDOMINAL HYSTERECTOMY     "partial" (11/03/2012)  . AV FISTULA PLACEMENT Right 12/03/2013   Procedure: CREATION OF ARTERIOVENOUS (AV) FISTULA RIGHT ARM ;  Surgeon: Rosetta Posner, MD;  Location: [facility];  Service: Vascular;  Laterality: Right;  . BASCILIC VEIN TRANSPOSITION Right 03/06/2014   Procedure: SECOND STAGE BASILIC VEIN TRANSPOSITION;  Surgeon: Rosetta Posner, MD;  Location: [facility] OR;  Service: Vascular;  Laterality: Right;  . ESOPHAGOGASTRODUODENOSCOPY N/A 06/05/2013   Procedure: ESOPHAGOGASTRODUODENOSCOPY (EGD);  Surgeon: Winfield Cunas., MD;  Location: [facility];  Service: Endoscopy;  Laterality: N/A;  . ESOPHAGOGASTRODUODENOSCOPY N/A 09/08/2016   Procedure: ESOPHAGOGASTRODUODENOSCOPY (EGD);  Surgeon: Milus Banister, MD;  Location: [facility];  Service: Endoscopy;  Laterality: N/A;  . REVISON OF ARTERIOVENOUS FISTULA Right 01/28/2017   Procedure: REVISON OF RIGHT ARM ARTERIOVENOUS FISTULA USING 8MMX10CM GORETEX GRAFT;  Surgeon: Angelia Mould, MD;  Location: [facility];  Service: Vascular;  Laterality: Right;  OB History   None      Home Medications    Prior to Admission medications   Medication Sig Start Date End Date Taking? Authorizing Provider  albuterol (PROVENTIL HFA;VENTOLIN HFA) 108 (90 Base) MCG/ACT inhaler Inhale 2 puffs into the lungs every 6 (six) hours as needed for wheezing or shortness of breath. MUST MAKE APPT FOR FURTHER  REFILLS 12/16/17   [name], MD  albuterol (PROVENTIL) (2.5 MG/3ML) 0.083% nebulizer solution Take 3 mLs (2.5 mg total) by nebulization every 6 (six) hours as needed for wheezing or shortness of breath. 11/24/17   Argentina Donovan, PA-C  amLODipine (NORVASC) 10 MG tablet Take 1 tablet (10 mg total) by mouth at bedtime. 11/23/17   Argentina Donovan, PA-C  calcium acetate (PHOSLO) 667 MG capsule Take 1,334 mg by mouth 3 (three) times daily with meals.     [provider]  metoprolol tartrate (LOPRESSOR) 25 MG tablet Take 1 tablet (25 mg total) by mouth 2 (two) times daily. 11/23/17   Argentina Donovan, PA-C  Miconazole Nitrate Applicator (MONISTAT 7 COMBO PACK APP) 100 & 2 MG-% (9GM) KIT Place 1 Applicatorful vaginally daily. 03/09/18   Charlesetta Shanks, MD  nicotine polacrilex (NICORETTE) 4 MG gum Take 1 each (4 mg total) by mouth as needed for smoking cessation. Patient not taking: Reported on 11/23/2017 06/17/17   Alfonse Spruce, FNP  pantoprazole (PROTONIX) 20 MG tablet Take 1 tablet (20 mg total) by mouth 2 (two) times daily before a meal. 11/23/17   McClung, Dionne Bucy, PA-C  predniSONE (DELTASONE) 10 MG tablet Take 2 tablets (20 mg total) by mouth 2 (two) times daily with a meal. 01/02/18   Veryl Speak, MD    Family History Family History  Problem Relation Age of Onset  . Lupus Mother   . Diabetes Father   . Heart attack Father     Social History Social History   Tobacco Use  . Smoking status: Light Tobacco Smoker    Packs/day: 0.50    Years: 20.00    Pack years: 10.00    Types: Cigarettes  . Smokeless tobacco: Never Used  Substance Use Topics  . Alcohol use: Yes    Alcohol/week: 1.0 standard drinks    Types: 1 Glasses of wine per week    Comment: 09/07/16-pt reports less than 1 glass of wine/week. 11/03/2012 "1-2 40oz beers/day maybe"   11/17/15 "I don;t drink beer anymore since Ive been on dialysis". p  . Drug use: Yes    Types: Marijuana    Comment: 11/03/2012  "maybe once or twice/wk" 11/17/15 "I haven't smoked in 3-4 months"     Allergies   Cefazolin   Review of Systems Review of Systems  Unable to perform ROS: Acuity of condition  Constitutional: Positive for fever.  Gastrointestinal: Positive for diarrhea and vomiting.  Musculoskeletal: Negative for neck pain and neck stiffness.  Neurological: Negative for headaches.     Physical Exam Updated Vital Signs BP (!) 167/104   Pulse (!) 151   Temp (!) 101.1 F (38.4 C) (Oral)   Resp (!) 36   Wt 60.3 kg   SpO2 98%   BMI 20.83 kg/m   Physical Exam CONSTITUTIONAL: Ill-appearing HEAD: Normocephalic/atraumatic no signs of trauma EYES: EOMI/PERRL icterus noted ENMT: Mucous membranes dry NECK: supple no meningeal signs SPINE/BACK:entire spine nontender, no bruising/crepitance/stepoffs noted to spine CV: Tachycardic LUNGS: Tachypnea, coarse breath sounds bilaterally ABDOMEN: soft, nontender, no rebound or guarding, bowel sounds noted throughout  abdomen GU: Mild tenderness to vulvar region, no erythema, no abscess, female nurse chaperone present for exam NEURO: Pt is awake/alert moves all extremitiesx4.  No facial droop.   EXTREMITIES: pulses normal/equal, full ROM, dialysis access noted to right arm with thrill noted No visible trauma. SKIN: warm, color normal PSYCH: no abnormalities of mood noted, alert and oriented to situation   ED Treatments / Results  Labs (all labs ordered are listed, but only abnormal results are displayed) Labs Reviewed  COMPREHENSIVE METABOLIC PANEL - Abnormal; Notable for the following components:      Result Value   Sodium 133 (*)    Potassium 6.2 (*)    Chloride 92 (*)    Glucose, Bld 105 (*)    BUN 59 (*)    Creatinine, Ser 11.91 (*)    AST 57 (*)    GFR calc non Af Amer 3 (*)    GFR calc Af Amer 4 (*)    Anion gap 19 (*)    All other components within normal limits  CBC WITH DIFFERENTIAL/PLATELET - Abnormal; Notable for the following  components:   RBC 3.63 (*)    Hemoglobin 11.0 (*)    HCT 33.4 (*)    Lymphs Abs 0.3 (*)    All other components within normal limits  I-STAT CG4 LACTIC ACID, ED - Abnormal; Notable for the following components:   Lactic Acid, Venous 2.18 (*)    All other components within normal limits  I-STAT BETA HCG BLOOD, ED ([facility], [facility], [facility] ONLY) - Abnormal; Notable for the following components:   I-stat hCG, quantitative 7.6 (*)    All other components within normal limits  CULTURE, BLOOD (ROUTINE X 2)  CULTURE, BLOOD (ROUTINE X 2)  PROTIME-INR  URINALYSIS, ROUTINE W REFLEX MICROSCOPIC  I-STAT CG4 LACTIC ACID, ED  CBG MONITORING, ED    EKG EKG Interpretation  Date/Time:  Friday March 10 2018 05:17:14 EDT Ventricular Rate:  152 PR Interval:    QRS Duration: 90 QT Interval:  319 QTC Calculation: 508 R Axis:   -30 Text Interpretation:  Sinus tachycardia RSR' in V1 or V2, right VCD or RVH Left ventricular hypertrophy Abnrm T, consider ischemia, anterolateral lds Abnormal ekg Confirmed by Ripley Fraise [phone]) on 03/10/2018 5:37:36 AM   Radiology Dg Chest 2 View  Result Date: 03/09/2018 CLINICAL DATA:  Fever EXAM: CHEST - 2 VIEW COMPARISON:  01/01/2018 FINDINGS: Cardiac enlargement. No vascular congestion, edema, or consolidation. Hyperinflation may represent emphysema or asthma. No airspace disease or consolidation in the lungs. No blunting of costophrenic angles. No pneumothorax. Mediastinal contours appear intact. Calcified and tortuous aorta. IMPRESSION: Hyperinflation. Cardiac enlargement. No evidence of active pulmonary disease. Electronically Signed   By: Lucienne Capers M.D.   On: 03/09/2018 03:21   Mr Thoracic Spine Wo Contrast  Result Date: 03/09/2018 CLINICAL DATA:  Thoracic spine pain and bilateral lower extremity weakness since a fall yesterday. End stage renal disease. Initial encounter. EXAM: MRI THORACIC SPINE WITHOUT CONTRAST TECHNIQUE: Multiplanar, multisequence MR  imaging of the thoracic spine was performed. No intravenous contrast was administered. COMPARISON:  CT abdomen and pelvis this same day. CT abdomen and pelvis and PA and lateral chest this same day. FINDINGS: Alignment:  Normal. Vertebrae: There is no fracture. Marrow signal is diffusely heterogeneous. While this finding cannot be definitively characterized, it is likely related to the patient's end-stage renal disease. The appearance is not typical of a neoplastic process. Cord:  Normal signal throughout. Paraspinal and other soft tissues:  Trace right pleural effusion is noted. Disc levels: The central spinal canal and neural foramina are widely patent at all levels. Tiny central disc protrusion at T7-8 is noted. Intervertebral disc spaces are otherwise unremarkable. IMPRESSION: Negative for fracture or other acute abnormality. Diffusely heterogeneous marrow signal pattern is likely related to end-stage renal disease. Widely patent central canal and foramina at all levels. Electronically Signed   By: Inge Rise M.D.   On: 03/09/2018 09:34   Ct Abdomen Pelvis W Contrast  Result Date: 03/09/2018 CLINICAL DATA:  Fall. Weakness and difficulty walking. EXAM: CT ABDOMEN AND PELVIS WITH CONTRAST TECHNIQUE: Multidetector CT imaging of the abdomen and pelvis was performed using the standard protocol following bolus administration of intravenous contrast. CONTRAST:  164m OMNIPAQUE IOHEXOL 300 MG/ML  SOLN COMPARISON:  11/13/2009 FINDINGS: Lower chest: Lung bases are clear. Mild right pleural thickening. Hepatobiliary: Mild diffuse fatty infiltration of the liver. No focal lesions. Gallbladder and bile ducts are unremarkable. Pancreas: Unremarkable. No pancreatic ductal dilatation or surrounding inflammatory changes. Spleen: Normal in size without focal abnormality. Adrenals/Urinary Tract: No adrenal gland nodules. Diffuse bilateral renal parenchymal atrophy, representing significant progression since previous  study. Delayed appearance of contrast material in the renal collecting systems consistent with chronic renal insufficiency. No hydronephrosis or hydroureter. Bladder wall is not thickened. Stomach/Bowel: Stomach, small bowel, and colon are mostly decompressed. No inflammatory infiltration is identified. Appendix is normal. Vascular/Lymphatic: Aortic atherosclerosis. No enlarged abdominal or pelvic lymph nodes. Reproductive: Status post hysterectomy. No adnexal masses. Other: No free air or free fluid in the abdomen. Abdominal wall musculature appears intact. Infiltration in the subcutaneous fat over the low pelvic and vulvar region may represent cellulitis. No abscess. Musculoskeletal: Disc space narrowing and sclerosis at the lumbosacral interspace with loss of cortex at the endplates. This has developed since previous study. Likely severe degenerative changes. Discitis not entirely excluded. Consider MRI for further evaluation if this is a clinical concern. IMPRESSION: 1. Mild diffuse fatty infiltration of the liver. 2. Diffuse bilateral renal parenchymal atrophy consistent with chronic renal insufficiency. New since previous study. No hydronephrosis or hydroureter. 3. Infiltration in the subcutaneous fat over the low pelvic and vulvar region may indicate cellulitis. No abscess. 4. Severe degenerative changes at the lumbosacral interspace. Discitis would possibly have this appearance. Consider MRI for further evaluation if this is a clinical concern. Aortic Atherosclerosis (ICD10-I70.0). Electronically Signed   By: WLucienne CapersM.D.   On: 03/09/2018 04:36   Dg Chest Port 1 View  Result Date: 03/10/2018 CLINICAL DATA:  Fever and sepsis EXAM: PORTABLE CHEST 1 VIEW COMPARISON:  Chest radiograph 03/09/2018 FINDINGS: Severe cardiomegaly. No focal airspace consolidation or pulmonary edema. No pneumothorax or sizable pleural effusion. IMPRESSION: Severe cardiomegaly without pulmonary edema. No focal  consolidation. Electronically Signed   By: KUlyses JarredM.D.   On: 03/10/2018 05:48    Procedures Procedures  CRITICAL CARE Performed by: DSharyon CableTotal critical care time: 35 minutes Critical care time was exclusive of separately billable procedures and treating other patients. Critical care was necessary to treat or prevent imminent or life-threatening deterioration. Critical care was time spent personally by me on the following activities: development of treatment plan with patient and/or surrogate as well as nursing, discussions with consultants, evaluation of patient's response to treatment, examination of patient, obtaining history from patient or surrogate, ordering and performing treatments and interventions, ordering and review of laboratory studies, ordering and review of radiographic studies, pulse oximetry and re-evaluation of patient's condition.  Medications Ordered in ED Medications  metroNIDAZOLE (FLAGYL) IVPB 500 mg (0 mg Intravenous Stopped 03/10/18 0659)  sodium chloride 0.9 % bolus 1,000 mL (1,000 mLs Intravenous New Bag/Given 03/10/18 0548)    And  sodium chloride 0.9 % bolus 1,000 mL (1,000 mLs Intravenous New Bag/Given 03/10/18 0548)  vancomycin (VANCOCIN) IVPB 1000 mg/200 mL premix (1,000 mg Intravenous New Bag/Given 03/10/18 0543)  acetaminophen (TYLENOL) tablet 650 mg (650 mg Oral Given 03/10/18 0552)  insulin aspart (novoLOG) injection 5 Units (5 Units Intravenous Given 03/10/18 0627)    And  dextrose 50 % solution 50 mL (50 mLs Intravenous Given 03/10/18 0626)     Initial Impression / Assessment and Plan / ED Course  I have reviewed the triage vital signs and the nursing notes.  Pertinent labs & imaging results that were available during my care of the patient were reviewed by me and considered in my medical decision making (see chart for details).     5:43 AM Patient presents with fever and tachycardia, code sepsis has been called.  She is  awake and alert, no neck pain or neck stiffness, no headache. The cause of her fever at this point is unclear Doubt meningitis Seen in the ER on October 10 for fatigue and feeling weak.  She had extensive evaluation including ct abd/pelvis that revealed no acute abdominal emergency, but possible lumbosacral discitis.  She underwent a thoracic spine MRI that did not reveal any acute issues, she refused to have lumbar spine MRI.  She elected to go home and she felt improved and was walking around the ER at that time. Now presents with fever and generalized weakness.  There is no focal weakness, she denies any back pain at this time.  Extensive labs and x-rays have been ordered.  Patient will require admission 6:50 AM Heart rate is improved.  Patient is now reporting back pain.  Due to recent CT imaging, she will likely need MRI lumbar to rule out discitis.  No immediate cause of her fever at this time. Patient with hyperkalemia, given IV fluids and IV insulin/dextrose 8:00 AM She is resting comfortably.  Heart rate is improved.  She is mildly tachypneic but no acute distress. Discussed need for admission.  Will proceed with MRI lumbar spine.  Discussed the case with internal medicine resident who will admit patient.  They will arrange dialysis. Final Clinical Impressions(s) / ED Diagnoses   Final diagnoses:  Sepsis, due to unspecified organism, unspecified whether acute organ dysfunction present [facility])  Hyperkalemia  ESRD (end stage renal disease) [facility])    ED Discharge Orders    None       Ripley Fraise, MD 03/10/18 [phone]

## 2018-03-10 NOTE — Progress Notes (Signed)
CRITICAL VALUE ALERT  Critical Value:  Lactic Acid 3.4   Date & Time Notied: 03/10/2018 2200  Provider Notified: n/a    Orders Received/Actions taken: trending down - continue to monitor

## 2018-03-10 NOTE — Significant Event (Signed)
Rapid Response Event Note  Overview: Time Called: 2449 Arrival Time: 1254 Event Type: Respiratory  Initial Focused Assessment: Patient recently arrived from Emergency room.  Now in acute respiratory distress.  Unable to speak or cough because of SOB.  Lung sounds rhonchi/crackles RR 40-50  With accessory muscles BP 250/110  HR 180   Resident at bedside  Interventions: Albuterol treatment given Placed patient on NRB O2 mask  O2 sat 100% ABG done 7.26/45/225/20 12 lead EKG done Placed on Bipap  Continued increased WOB and increased RR 50s After about an hour she started to become more responsive and improved WOB but RR still 45-55. BP 215/120  HR 171   10mg  Labetalol given BP 180/110  HR 152 D50 and 10 u insulin given IV   Awaiting Calcium gluconate from pharmacy Dr Jonnie Finner at bedside to assess patient recommend transfer to ICU and HD in ICU.  Teaching service team at bedside in agreement: CCM consulted Patient starting to improve a little:  Bp 145/100 HR 152  RR 48  O2 sat 100% on Bipap.   Transferred to 4N22 via bed with [place] (if not transferred):  Event Summary: Name of Physician Notified: Teaching service residents at      at    Outcome: Transferred (Comment)  Event End Time: [place]  Raliegh Ip

## 2018-03-10 NOTE — Consult Note (Addendum)
NAME:  Karen Morrow, MRN:  [id number], DOB:  Sep 30, 1960, LOS: 0 ADMISSION DATE:  03/10/2018, CONSULTATION DATE:  03/10/2018 REFERRING MD: Graciella Freer, CHIEF COMPLAINT:  Resp Distress  Brief History   67 yoF ESRD presenting with fever, AMS, leg weakness since 10/9.  No clear source of infection.  Given IVF elevated lactate and developed pulm edema and hypertensive, on BiPAP and requiring emergent iHD.  Past Medical History  Tobacco abuse, occasional THC, ESRD, HTN, Asthma, GERD, Hep C  Significant Hospital Events   10/11 Admit  Consults: date of consult/date signed off & final recs:  Nephrology 10/10 Neurology 10/11  Procedures (surgical and bedside):  10/11 bedside iHD  Significant Diagnostic Tests:  10/10 MRI thoracic spine >> Negative for fracture or other acute abnormality. Diffusely heterogeneous marrow signal pattern is likely related to end-stage renal disease. Widely patent central canal and foramina at all levels.  10/10 CT abd >>  1. Mild diffuse fatty infiltration of the liver. 2. Diffuse bilateral renal parenchymal atrophy consistent with chronic renal insufficiency. New since previous study. No hydronephrosis or hydroureter. 3. Infiltration in the subcutaneous fat over the low pelvic and vulvar region may indicate cellulitis. No abscess. 4. Severe degenerative changes at the lumbosacral interspace. Discitis would possibly have this appearance. Consider MRI for further evaluation if this is a clinical concern. Aortic Atherosclerosis   10/11 MRI lumbar >> 1. Advanced chronic disc and endplate degeneration at L5-S1 but no evidence of discitis osteomyelitis. 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 2. There is a small area of nonspecific inflammation in the medial left psoas muscle at L4, but there is no evidence of spinal infection. 3. Capacious spinal canal with no degenerative spinal  stenosis. There is degenerative bilateral L5 neural foraminal stenosis.  Micro Data:  10/11 BCx 2 >> 10/11 MRSA PCR >>  Antimicrobials:  10/11 vanc >> 10/11 zosyn >> 10/11 clindamycin >>  Subjective:    Objective   Blood pressure (!) 145/100, pulse (!) 152, temperature 99.3 F (37.4 C), temperature source Oral, resp. rate (!) 48, weight 60.3 kg, SpO2 100 %.       No intake or output data in the 24 hours ending 03/10/18 1428 Filed Weights   03/10/18 0512  Weight: 60.3 kg    Examination: General:  Acutely ill adult female lying in bed on BiPAP in distress HEENT: pupils 3/reactive, well fitting full face mask, +JVD, no nuchal stiffness Neuro: Alert, f/c, 4/5 UE, significant BLE weakness able to wiggle toes CV:  ST/ SVT 140-150's, bounding pulses, difficult to hear HS over lung sounds PULM: tachypneic in 40-50 on BiPAP, diffuse rhonchi /rales, no wheeze GI: soft, no significant tenderness, bs active GU: some vulva enlargement more R>L, but no crepitus, erythema, drainage, or tenderness noted Extremities: warm/dry, trace BLE edema  Skin: no rashes   Resolved Hospital Problem list    Assessment & Plan:  Acute hypoxic respiratory failure in the setting of acute pulmonary edema - s/p 2.5L IVF in ER for suspected sepsis P:  Continue BiPAP for now High risk for decompensation if iHD is delayed iHD in ICU Wean FiO2/ PEEP as able CXR in am  PRN BD  Sepsis w/ Fever without leukocytosis - initial CXR neg - CT abd showing inflitration over the lower vulvar area which may indicate cellulitis and MRI lumbar notes nonspecific inflammation in the medial left psoas muscle - areas on exam without crepitus, erythema or obvious tenderness  -  no osteo or discitis noted on MRI thoracic/ lumbar - has had acute encephalopathy but no nuchal rigidity but can not rule out CNS involvement given significant lower extremity weakness  P:  Continue with vanc and zosyn for now Follow blood  cultures Trend WBC / fever curve Will consult neurology   Assess lactate TTE  Hypertensive Crisis  P:  Monitor iHD in ICU Prn labetalol Hold PO meds given respiratory distress  ESRD Hyperkalemia in the ESRD, after missing Thursday session P:  Nephrology following, appreciate assistance Emergent iHD once in ICU per RUE AVF- BP hypertensive Repeat renal panel after HD  Chronic Anemia  -Trend CBC - esa per renal  Diarrhea - x3 stools today, however stools are dark and not liquid consistency to send for cdiff testing. - no further vomiting today - monitor  Disposition / Summary of Today's Plan 03/10/18   Transfer to ICU for stabilization and emergent iHD   Diet: NPO Pain/Anxiety/Delirium protocol (if indicated): n/a VAP protocol (if indicated): n/a DVT prophylaxis: heparin SQ GI prophylaxis: pepcid  Hyperglycemia protocol: n/a Mobility: BR Code Status: Full Family Communication: Husband updated at bedside.   Labs   CBC: Recent Labs  Lab 03/08/18 2033 03/10/18 0509 03/10/18 0854  WBC 10.2 8.4 7.8  NEUTROABS  --  7.7  --   HGB 10.7* 11.0* 9.7*  HCT 33.5* 33.4* 29.9*  MCV 94.6 92.0 94.9  PLT 188 154 125*    Basic Metabolic Panel: Recent Labs  Lab 03/08/18 2033 03/10/18 0509 03/10/18 0854  NA 133* 133*  --   K 4.8 6.2*  --   CL 94* 92*  --   CO2 23 22  --   GLUCOSE 103* 105*  --   BUN 30* 59*  --   CREATININE 8.34* 11.91* 12.02*  CALCIUM 10.1 9.8  --    GFR: Estimated Creatinine Clearance: 4.9 mL/min (A) (by C-G formula based on SCr of 12.02 mg/dL (H)). Recent Labs  Lab 03/08/18 2033 03/08/18 2105 03/10/18 0509 03/10/18 0519 03/10/18 0841 03/10/18 0854 03/10/18 1219  PROCALCITON  --   --   --   --   --  17.63  --   WBC 10.2  --  8.4  --   --  7.8  --   LATICACIDVEN  --  1.46  --  2.18* 3.04*  --  2.0*    Liver Function Tests: Recent Labs  Lab 03/10/18 0509  AST 57*  ALT 30  ALKPHOS 95  BILITOT 1.2  PROT 7.5  ALBUMIN 3.5   No  results for input(s): LIPASE, AMYLASE in the last 168 hours. No results for input(s): AMMONIA in the last 168 hours.  ABG    Component Value Date/Time   PHART 7.265 (L) 03/10/2018 1301   PCO2ART 45.6 03/10/2018 1301   PO2ART 225 (H) 03/10/2018 1301   HCO3 20.0 03/10/2018 1301   TCO2 10 11/26/2013 1843   ACIDBASEDEF 5.8 (H) 03/10/2018 1301   O2SAT 98.1 03/10/2018 1301     Coagulation Profile: Recent Labs  Lab 03/10/18 0509  INR 0.97    Cardiac Enzymes: Recent Labs  Lab 03/08/18 2033  CKTOTAL 531*    HbA1C: Hgb A1c MFr Bld  Date/Time Value Ref Range Status  11/26/2013 08:05 PM 4.7 <5.7 % Final    Comment:    (NOTE)  According to the ADA Clinical Practice Recommendations for 2011, when HbA1c is used as a screening test:  >=6.5%   Diagnostic of Diabetes Mellitus           (if abnormal result is confirmed) 5.7-6.4%   Increased risk of developing Diabetes Mellitus References:Diagnosis and Classification of Diabetes Mellitus,Diabetes QXIH,0388,82(CMKLK 1):S62-S69 and Standards of Medical Care in         Diabetes - 2011,Diabetes JZPH,1505,69 (Suppl 1):S11-S61.    CBG: Recent Labs  Lab 03/09/18 0036 03/10/18 0758 03/10/18 1331 03/10/18 1421  GLUCAP 91 77 101* 149*    Admitting History of Present Illness.   78 yoF ESRD TTHS anuric patient who missed dialysis Thursday presenting with fever and AMS for 2 days with associated vomiting, diarrhea, SOB, and generalized weakness.    Of note seen in ER on 10/9 with fever, generalized weakness, back pain, and reported recent vulvar swelling and yeast.  Had MRI of thoracic spine, but was unable to tolerate lumbar MRI.  Again found confused at home with ongoing fever and difficulty walking.  In ER, no leukocytosis or clear source of infection.  Initial CXR clear.  Given IVF elevated lactate and developed acute pulmonary edema and hypertensive, on BiPAP,  transfer to ICU, and requiring emergent iHD.  PCCM consulted for further management in ICU.   Review of Systems:   Unable to assess as patient is in respiratory distress on BiPAP  Past Medical History  She,  has a past medical history of Anginal pain ([place]), Asthma, Chronic kidney disease, Dyspnea, Frequent bowel movements, GERD (gastroesophageal reflux disease), Hepatitis C antibody test positive, and Hypertension.   Surgical History    Past Surgical History:  Procedure Laterality Date  . ABDOMINAL HYSTERECTOMY     "partial" (11/03/2012)  . AV FISTULA PLACEMENT Right 12/03/2013   Procedure: CREATION OF ARTERIOVENOUS (AV) FISTULA RIGHT ARM ;  Surgeon: Rosetta Posner, MD;  Location: [facility];  Service: Vascular;  Laterality: Right;  . BASCILIC VEIN TRANSPOSITION Right 03/06/2014   Procedure: SECOND STAGE BASILIC VEIN TRANSPOSITION;  Surgeon: Rosetta Posner, MD;  Location: [facility] OR;  Service: Vascular;  Laterality: Right;  . ESOPHAGOGASTRODUODENOSCOPY N/A 06/05/2013   Procedure: ESOPHAGOGASTRODUODENOSCOPY (EGD);  Surgeon: Winfield Cunas., MD;  Location: [facility];  Service: Endoscopy;  Laterality: N/A;  . ESOPHAGOGASTRODUODENOSCOPY N/A 09/08/2016   Procedure: ESOPHAGOGASTRODUODENOSCOPY (EGD);  Surgeon: Milus Banister, MD;  Location: [facility];  Service: Endoscopy;  Laterality: N/A;  . REVISON OF ARTERIOVENOUS FISTULA Right 01/28/2017   Procedure: REVISON OF RIGHT ARM ARTERIOVENOUS FISTULA USING 8MMX10CM GORETEX GRAFT;  Surgeon: Angelia Mould, MD;  Location: [facility] OR;  Service: Vascular;  Laterality: Right;     Social History   Social History   Socioeconomic History  . Marital status: Married    Spouse name: Not on file  . Number of children: Not on file  . Years of education: Not on file  . Highest education level: Not on file  Occupational History  . Not on file  Social Needs  . Financial resource strain: Not on file  . Food insecurity:    Worry: Not on file    Inability: Not on  file  . Transportation needs:    Medical: Not on file    Non-medical: Not on file  Tobacco Use  . Smoking status: Light Tobacco Smoker    Packs/day: 0.50    Years: 20.00    Pack years: 10.00    Types: Cigarettes  . Smokeless  tobacco: Never Used  Substance and Sexual Activity  . Alcohol use: Yes    Alcohol/week: 1.0 standard drinks    Types: 1 Glasses of wine per week    Comment: 09/07/16-pt reports less than 1 glass of wine/week. 11/03/2012 "1-2 40oz beers/day maybe"   11/17/15 "I don;t drink beer anymore since Ive been on dialysis". p  . Drug use: Yes    Types: Marijuana    Comment: 11/03/2012 "maybe once or twice/wk" 11/17/15 "I haven't smoked in 3-4 months"  . Sexual activity: Not Currently  Lifestyle  . Physical activity:    Days per week: Not on file    Minutes per session: Not on file  . Stress: Not on file  Relationships  . Social connections:    Talks on phone: Not on file    Gets together: Not on file    Attends religious service: Not on file    Active member of club or organization: Not on file    Attends meetings of clubs or organizations: Not on file    Relationship status: Not on file  . Intimate partner violence:    Fear of current or ex partner: Not on file    Emotionally abused: Not on file    Physically abused: Not on file    Forced sexual activity: Not on file  Other Topics Concern  . Not on file  Social History Narrative  . Not on file  ,  reports that she has been smoking cigarettes. She has a 10.00 pack-year smoking history. She has never used smokeless tobacco. She reports that she drinks about 1.0 standard drinks of alcohol per week. She reports that she has current or past drug history. Drug: Marijuana.   Family History   Her family history includes Diabetes in her father; Heart attack in her father; Lupus in her mother.   Allergies Allergies  Allergen Reactions  . Cefazolin Other (See Comments)    Severe thrombocytopenia     Home Medications    Prior to Admission medications   Medication Sig Start Date End Date Taking? Authorizing Provider  albuterol (PROVENTIL HFA;VENTOLIN HFA) 108 (90 Base) MCG/ACT inhaler Inhale 2 puffs into the lungs every 6 (six) hours as needed for wheezing or shortness of breath. MUST MAKE APPT FOR FURTHER REFILLS 12/16/17   [name], MD  albuterol (PROVENTIL) (2.5 MG/3ML) 0.083% nebulizer solution Take 3 mLs (2.5 mg total) by nebulization every 6 (six) hours as needed for wheezing or shortness of breath. 11/24/17   Argentina Donovan, PA-C  amLODipine (NORVASC) 10 MG tablet Take 1 tablet (10 mg total) by mouth at bedtime. 11/23/17   Argentina Donovan, PA-C  calcium acetate (PHOSLO) 667 MG capsule Take 1,334 mg by mouth 3 (three) times daily with meals.     [provider]  metoprolol tartrate (LOPRESSOR) 25 MG tablet Take 1 tablet (25 mg total) by mouth 2 (two) times daily. 11/23/17   Argentina Donovan, PA-C  Miconazole Nitrate Applicator (MONISTAT 7 COMBO PACK APP) 100 & 2 MG-% (9GM) KIT Place 1 Applicatorful vaginally daily. 03/09/18   Charlesetta Shanks, MD  nicotine polacrilex (NICORETTE) 4 MG gum Take 1 each (4 mg total) by mouth as needed for smoking cessation. Patient not taking: Reported on 11/23/2017 06/17/17   Alfonse Spruce, FNP  pantoprazole (PROTONIX) 20 MG tablet Take 1 tablet (20 mg total) by mouth 2 (two) times daily before a meal. 11/23/17   McClung, Dionne Bucy, PA-C  predniSONE (DELTASONE)  10 MG tablet Take 2 tablets (20 mg total) by mouth 2 (two) times daily with a meal. 01/02/18   Veryl Speak, MD     Critical care time: 27 mins  Kennieth Rad, AGACNP-BC [facility] Pgr: [phone] or if no answer [phone] 03/10/2018, 3:15 PM

## 2018-03-11 ENCOUNTER — Inpatient Hospital Stay (HOSPITAL_COMMUNITY): Payer: Medicare Other

## 2018-03-11 ENCOUNTER — Inpatient Hospital Stay (HOSPITAL_COMMUNITY): Payer: Self-pay

## 2018-03-11 DIAGNOSIS — M79671 Pain in right foot: Secondary | ICD-10-CM

## 2018-03-11 DIAGNOSIS — M79672 Pain in left foot: Secondary | ICD-10-CM

## 2018-03-11 DIAGNOSIS — R4182 Altered mental status, unspecified: Secondary | ICD-10-CM

## 2018-03-11 DIAGNOSIS — A4101 Sepsis due to Methicillin susceptible Staphylococcus aureus: Secondary | ICD-10-CM

## 2018-03-11 DIAGNOSIS — I340 Nonrheumatic mitral (valve) insufficiency: Secondary | ICD-10-CM

## 2018-03-11 DIAGNOSIS — R509 Fever, unspecified: Secondary | ICD-10-CM

## 2018-03-11 DIAGNOSIS — I351 Nonrheumatic aortic (valve) insufficiency: Secondary | ICD-10-CM

## 2018-03-11 DIAGNOSIS — I350 Nonrheumatic aortic (valve) stenosis: Secondary | ICD-10-CM

## 2018-03-11 LAB — CBC
HCT: 29.5 % — ABNORMAL LOW (ref 36.0–46.0)
Hemoglobin: 10.3 g/dL — ABNORMAL LOW (ref 12.0–15.0)
MCH: 31.8 pg (ref 26.0–34.0)
MCHC: 34.9 g/dL (ref 30.0–36.0)
MCV: 91 fL (ref 80.0–100.0)
Platelets: 167 10*3/uL (ref 150–400)
RBC: 3.24 MIL/uL — ABNORMAL LOW (ref 3.87–5.11)
RDW: 13.6 % (ref 11.5–15.5)
WBC: 4.8 10*3/uL (ref 4.0–10.5)
nRBC: 0 % (ref 0.0–0.2)

## 2018-03-11 LAB — RENAL FUNCTION PANEL
Albumin: 2.9 g/dL — ABNORMAL LOW (ref 3.5–5.0)
Anion gap: 17 — ABNORMAL HIGH (ref 5–15)
BUN: 31 mg/dL — ABNORMAL HIGH (ref 6–20)
CO2: 22 mmol/L (ref 22–32)
Calcium: 9.1 mg/dL (ref 8.9–10.3)
Chloride: 95 mmol/L — ABNORMAL LOW (ref 98–111)
Creatinine, Ser: 7.01 mg/dL — ABNORMAL HIGH (ref 0.44–1.00)
GFR calc Af Amer: 7 mL/min — ABNORMAL LOW (ref >60)
GFR calc non Af Amer: 6 mL/min — ABNORMAL LOW (ref >60)
Glucose, Bld: 70 mg/dL (ref 70–99)
Phosphorus: 5.8 mg/dL — ABNORMAL HIGH (ref 2.5–4.6)
Potassium: 4.8 mmol/L (ref 3.5–5.1)
Sodium: 134 mmol/L — ABNORMAL LOW (ref 135–145)

## 2018-03-11 LAB — HIV ANTIBODY (ROUTINE TESTING W REFLEX): HIV Screen 4th Generation wRfx: NONREACTIVE

## 2018-03-11 LAB — ECHOCARDIOGRAM COMPLETE: Weight: 2137.58 oz

## 2018-03-11 MED ORDER — LABETALOL HCL 5 MG/ML IV SOLN
10.0000 mg | INTRAVENOUS | Status: DC | PRN
  Administered 2018-03-12: 10 mg via INTRAVENOUS
  Filled 2018-03-11: qty 4

## 2018-03-11 MED ORDER — RENA-VITE PO TABS
1.0000 | ORAL_TABLET | Freq: Every day | ORAL | Status: DC
  Administered 2018-03-11 – 2018-03-18 (×8): 1 via ORAL
  Filled 2018-03-11 (×8): qty 1

## 2018-03-11 NOTE — Progress Notes (Signed)
Pt taken off BIPAP and placed on 4L nasal cannula.  Tolerating well at this time.  RT will continue to monitor.

## 2018-03-11 NOTE — Progress Notes (Signed)
PULMONARY / CRITICAL CARE MEDICINE   NAME:  Karen Morrow, MRN:  [id number], DOB:  08-27-1960, LOS: 1 ADMISSION DATE:  03/10/2018, CONSULTATION DATE:  03/10/2018 REFERRING MD:  [place], CHIEF COMPLAINT: Extreme dyspnea, fever  BRIEF HISTORY:         57 year old with end-stage renal disease who presented with fever and weakness.  We are asked to see her for extreme shortness of breath quiring BiPAP for support.  Cultures have grown MSSA. HISTORY OF PRESENT ILLNESS       Is a 57 year old end-stage renal disease who presented with fever and lower extremity weakness with falling episodes.  She has had an MRI of the thoracic and lumbar spines without any evidence of epidural abscess.  She came to our attention yesterday when she was hypertensive extremely tachycardic, tachypneic and acutely short of breath.  She was placed on BiPAP and transferred to the intensive care unit for acute hemodialysis.  Following fluid removal her dyspnea improved and this morning she is no longer on BiPAP.  Blood cultures turned positive for early, and she is growing MSSA from the blood.  Source is not overt.  Chest x-ray is really not remarkable and she does not have a cough. SIGNIFICANT PAST MEDICAL HISTORY   No known needles.  SIGNIFICANT EVENTS:   STUDIES:    CULTURES:  Cultures positive for MSSA  ANTIBIOTICS:  Ancef  LINES/TUBES:    CONSULTANTS:  Nephrology and infectious disease SUBJECTIVE:  She is much more comfortable and alert than when seen by me yesterday.  She has no complaint of dyspnea cough or back pain at present.  She thinks that the strength in her legs is much improved.  CONSTITUTIONAL: BP (!) 154/86   Pulse (!) 103   Temp 99.2 F (37.3 C) (Axillary)   Resp (!) 29   Wt 60.6 kg   SpO2 98%   BMI 20.92 kg/m   I/O last 3 completed shifts: In: 314.7 [I.V.:104.8; IV Piggyback:209.9] Out: 2351 [Other:2350; Stool:1]     FiO2 (%):  [30 %-60 %] 30 %  PHYSICAL EXAM: General:  Chronically ill-appearing 57 year old who is conversing appropriately and in no distress Neuro: Oriented x3.  She may have some slight weakness in the lower extremities perhaps 4 out of 5 proximally and distally but it is much improved from yesterday. HEENT: no JVD Cardiovascular: S1 and S2 are regular without murmur after prolonged auscultation.  She does not have dependent edema.  Her right arm AV fistula may be somewhat warm and is tender at the distal end.  No obvious fluctuance  Lungs: Respirations are unlabored there is symmetric air movement, no wheezes, no rhonchi Abdomen: Abdomen is soft without any organomegaly masses or tenderness Musculoskeletal: Not remarkable Skin: Do not appreciate any pustules  RESOLVED PROBLEM LIST   ASSESSMENT AND PLAN   57 year old end-stage renal disease patient on dialysis with MSSA bacteremia. 1.  Dyspnea.  Dyspnea yesterday appears to have been secondary to fluid overload exacerbated by extreme hypertension.  Following dialysis she is now breathing comfortably.  She does not have an infiltrate in it does not appear that the source of her MSSA is the lungs. 2.  MSSA bacteremia.  I am concerned that her AV fistula may be the source.  An echocardiogram has been obtained but has not yet been read.  He has no evidence of epidural abscess on thoracic or lumbar spine MRI to account for lower extremity weakness.  Should the weakness progress I would obtain imaging studies  of the head especially if she proves to have vegetations on the left-sided valves.  SUMMARY OF TODAY'S PLAN:    Best Practice / Goals of Care / Disposition.   DVT PROPHYLAXIS: Subcu heparin SUP: diet begun NUTRITION: MOBILITY: GOALS OF CARE: FAMILY DISCUSSIONS:  DISPOSITION   LABS  Glucose Recent Labs  Lab 03/09/18 0036 03/10/18 0758 03/10/18 1331 03/10/18 1421  GLUCAP 91 77 101* 149*    BMET Recent Labs  Lab 03/10/18 0509 03/10/18 0854 03/10/18 1439 03/11/18 0323  NA  133*  --  133* 134*  K 6.2*  --  5.3* 4.8  CL 92*  --  97* 95*  CO2 22  --  20* 22  BUN 59*  --  71* 31*  CREATININE 11.91* 12.02* 12.68* 7.01*  GLUCOSE 105*  --  109* 70    Liver Enzymes Recent Labs  Lab 03/10/18 0509 03/11/18 0323  AST 57*  --   ALT 30  --   ALKPHOS 95  --   BILITOT 1.2  --   ALBUMIN 3.5 2.9*    Electrolytes Recent Labs  Lab 03/10/18 0509 03/10/18 1439 03/11/18 0323  CALCIUM 9.8 9.0 9.1  PHOS  --   --  5.8*    CBC Recent Labs  Lab 03/10/18 0509 03/10/18 0854 03/11/18 0323  WBC 8.4 7.8 4.8  HGB 11.0* 9.7* 10.3*  HCT 33.4* 29.9* 29.5*  PLT 154 125* 167    ABG Recent Labs  Lab 03/10/18 1301  PHART 7.265*  PCO2ART 45.6  PO2ART 225*    Coag's Recent Labs  Lab 03/10/18 0509  INR 0.97    Sepsis Markers Recent Labs  Lab 03/10/18 0854 03/10/18 1219 03/10/18 1439 03/10/18 2056  LATICACIDVEN  --  2.0* 3.6* 3.4*  PROCALCITON 17.63  --   --   --     Cardiac Enzymes No results for input(s): TROPONINI, PROBNP in the last 168 hours.  PAST MEDICAL HISTORY :   She  has a past medical history of Anginal pain ([place]), Asthma, Chronic kidney disease, Dyspnea, Frequent bowel movements, GERD (gastroesophageal reflux disease), Hepatitis C antibody test positive, and Hypertension.  PAST SURGICAL HISTORY:  She  has a past surgical history that includes Abdominal hysterectomy; Esophagogastroduodenoscopy (N/A, 06/05/2013); AV fistula placement (Right, 12/03/2013); Bascilic vein transposition (Right, 03/06/2014); Esophagogastroduodenoscopy (N/A, 09/08/2016); and Revison of arteriovenous fistula (Right, 01/28/2017).  Allergies  Allergen Reactions  . Cefazolin Other (See Comments)    Severe thrombocytopenia (has tolerated zosyn in the past) ID aware    No current facility-administered medications on file prior to encounter.    Current Outpatient Medications on File Prior to Encounter  Medication Sig  . albuterol (PROVENTIL HFA;VENTOLIN HFA) 108 (90  Base) MCG/ACT inhaler Inhale 2 puffs into the lungs every 6 (six) hours as needed for wheezing or shortness of breath. MUST MAKE APPT FOR FURTHER REFILLS  . albuterol (PROVENTIL) (2.5 MG/3ML) 0.083% nebulizer solution Take 3 mLs (2.5 mg total) by nebulization every 6 (six) hours as needed for wheezing or shortness of breath.  Marland Kitchen amLODipine (NORVASC) 10 MG tablet Take 1 tablet (10 mg total) by mouth at bedtime.  . calcium acetate (PHOSLO) 667 MG capsule Take 1,334 mg by mouth 3 (three) times daily with meals.   . metoprolol tartrate (LOPRESSOR) 25 MG tablet Take 1 tablet (25 mg total) by mouth 2 (two) times daily.  . Miconazole Nitrate Applicator (MONISTAT 7 COMBO PACK APP) 100 & 2 MG-% (9GM) KIT Place 1 Applicatorful vaginally daily.  Marland Kitchen  nicotine polacrilex (NICORETTE) 4 MG gum Take 1 each (4 mg total) by mouth as needed for smoking cessation. (Patient not taking: Reported on 11/23/2017)  . pantoprazole (PROTONIX) 20 MG tablet Take 1 tablet (20 mg total) by mouth 2 (two) times daily before a meal.  . predniSONE (DELTASONE) 10 MG tablet Take 2 tablets (20 mg total) by mouth 2 (two) times daily with a meal.    FAMILY HISTORY:   Her family history includes Diabetes in her father; Heart attack in her father; Lupus in her mother.  SOCIAL HISTORY:  She  reports that she has been smoking cigarettes. She has a 10.00 pack-year smoking history. She has never used smokeless tobacco. She reports that she drinks about 1.0 standard drinks of alcohol per week. She reports that she has current or past drug history. Drug: Marijuana.  REVIEW OF SYSTEMS:      Lars Masson, MD

## 2018-03-11 NOTE — Progress Notes (Signed)
VASCULAR LAB PRELIMINARY  PRELIMINARY  PRELIMINARY  PRELIMINARY  Right upper extremity duplex of AV graft completed.    Preliminary report:  Graft is patent.  Unable to rule out infection.  Cabot Cromartie, RVT 03/11/2018, 5:58 PM

## 2018-03-11 NOTE — Progress Notes (Signed)
INFECTIOUS DISEASE PROGRESS NOTE  ID: Karen Morrow is a 57 y.o. female with  Active Problems:   ESRD (end stage renal disease) ([place])   Sepsis ([place])   Hyperkalemia   Acute respiratory distress  Subjective: Feels better, complains of pain in feet.   Abtx:  Anti-infectives (From admission, onward)   Start     Dose/Rate Route Frequency Ordered Stop   03/10/18 2300  ceFAZolin (ANCEF) IVPB 1 g/50 mL premix     1 g 100 mL/hr over 30 Minutes Intravenous Daily at bedtime 03/10/18 2220     03/10/18 1415  piperacillin-tazobactam (ZOSYN) IVPB 3.375 g  Status:  Discontinued     3.375 g 12.5 mL/hr over 240 Minutes Intravenous Every 12 hours 03/10/18 1406 03/10/18 1854   03/10/18 1406  vancomycin variable dose per unstable renal function (pharmacist dosing)  Status:  Discontinued      Does not apply See admin instructions 03/10/18 1407 03/10/18 1854   03/10/18 0545  metroNIDAZOLE (FLAGYL) IVPB 500 mg  Status:  Discontinued     500 mg 100 mL/hr over 60 Minutes Intravenous Every 8 hours 03/10/18 0535 03/10/18 0956   03/10/18 0545  vancomycin (VANCOCIN) IVPB 1000 mg/200 mL premix     1,000 mg 200 mL/hr over 60 Minutes Intravenous  Once 03/10/18 0535 03/10/18 0643      Medications:  Scheduled: . chlorhexidine  15 mL Mouth Rinse BID  . Chlorhexidine Gluconate Cloth  6 each Topical Q0600  . heparin  5,000 Units Subcutaneous Q8H  . mouth rinse  15 mL Mouth Rinse q12n4p  . multivitamin  1 tablet Oral QHS    Objective: Vital signs in last 24 hours: Temp:  [99.2 F (37.3 C)-103.4 F (39.7 C)] 99.2 F (37.3 C) (10/12 1200) Pulse Rate:  [92-156] 103 (10/12 1100) Resp:  [22-41] 29 (10/12 1100) BP: (80-195)/(67-119) 154/86 (10/12 1100) SpO2:  [94 %-100 %] 98 % (10/12 1100) FiO2 (%):  [30 %-60 %] 30 % (10/12 0415) Weight:  [60.6 kg-63.8 kg] 60.6 kg (10/11 2000)   General appearance: alert, cooperative and no distress Resp: clear to auscultation bilaterally Cardio: regular rate and  rhythm GI: normal findings: bowel sounds normal and soft, non-tender Extremities: RUE graft is warm, non-tender, +bruit.   Lab Results Recent Labs    03/10/18 0854 03/10/18 1439 03/11/18 0323  WBC 7.8  --  4.8  HGB 9.7*  --  10.3*  HCT 29.9*  --  29.5*  NA  --  133* 134*  K  --  5.3* 4.8  CL  --  97* 95*  CO2  --  20* 22  BUN  --  71* 31*  CREATININE 12.02* 12.68* 7.01*   Liver Panel Recent Labs    03/10/18 0509 03/11/18 0323  PROT 7.5  --   ALBUMIN 3.5 2.9*  AST 57*  --   ALT 30  --   ALKPHOS 95  --   BILITOT 1.2  --    Sedimentation Rate Recent Labs    03/10/18 1219  ESRSEDRATE 21   C-Reactive Protein Recent Labs    03/10/18 1219  CRP 3.4*    Microbiology: Recent Results (from the past 240 hour(s))  Culture, blood (Routine x 2)     Status: Abnormal (Preliminary result)   Collection Time: 03/10/18  5:06 AM  Result Value Ref Range Status   Specimen Description BLOOD LEFT ANTECUBITAL  Final   Special Requests   Final    BOTTLES DRAWN AEROBIC AND  ANAEROBIC Blood Culture results may not be optimal due to an inadequate volume of blood received in culture bottles   Culture  Setup Time   Final    GRAM POSITIVE COCCI IN CLUSTERS IN BOTH AEROBIC AND ANAEROBIC BOTTLES CRITICAL VALUE NOTED.  VALUE IS CONSISTENT WITH PREVIOUSLY REPORTED AND CALLED VALUE. Performed at [facility], [place] [address], [place], [place] [address]    Culture STAPHYLOCOCCUS AUREUS (A)  Final   Report Status PENDING  Incomplete  Culture, blood (Routine x 2)     Status: Abnormal (Preliminary result)   Collection Time: 03/10/18  6:21 AM  Result Value Ref Range Status   Specimen Description BLOOD LEFT HAND  Final   Special Requests   Final    BOTTLES DRAWN AEROBIC AND ANAEROBIC Blood Culture results may not be optimal due to an inadequate volume of blood received in culture bottles   Culture  Setup Time   Final    GRAM POSITIVE COCCI IN CLUSTERS IN BOTH AEROBIC AND ANAEROBIC  BOTTLES CRITICAL RESULT CALLED TO, READ BACK BY AND VERIFIED WITH: C. PIERCE, PHARMD AT 1705 ON 03/10/18 BY C. JESSUP, MLT.    Culture (A)  Final    STAPHYLOCOCCUS AUREUS SUSCEPTIBILITIES TO FOLLOW Performed at [facility], [place] [address] [place], [place] [address]    Report Status PENDING  Incomplete  Blood Culture ID Panel (Reflexed)     Status: Abnormal   Collection Time: 03/10/18  6:21 AM  Result Value Ref Range Status   Enterococcus species NOT DETECTED NOT DETECTED Final   Vancomycin resistance NOT DETECTED NOT DETECTED Final   Listeria monocytogenes NOT DETECTED NOT DETECTED Final   Staphylococcus species DETECTED (A) NOT DETECTED Final    Comment: CRITICAL RESULT CALLED TO, READ BACK BY AND VERIFIED WITH: C. PIERCE, PHARMD AT 1705 ON 03/10/18 BY C. JESSUP, MLT.    Staphylococcus aureus (BCID) DETECTED (A) NOT DETECTED Final    Comment: Methicillin (oxacillin) susceptible Staphylococcus aureus (MSSA). Preferred therapy is anti staphylococcal beta lactam antibiotic (Cefazolin or Nafcillin), unless clinically contraindicated. CRITICAL RESULT CALLED TO, READ BACK BY AND VERIFIED WITH: C. PIERCE, PHARMD AT 1705 ON 03/10/18 BY C. JESSUP, MLT.    Methicillin resistance NOT DETECTED NOT DETECTED Final   Streptococcus species NOT DETECTED NOT DETECTED Final   Streptococcus agalactiae NOT DETECTED NOT DETECTED Final   Streptococcus pneumoniae NOT DETECTED NOT DETECTED Final   Streptococcus pyogenes NOT DETECTED NOT DETECTED Final   Acinetobacter baumannii NOT DETECTED NOT DETECTED Final   Enterobacteriaceae species NOT DETECTED NOT DETECTED Final   Enterobacter cloacae complex NOT DETECTED NOT DETECTED Final   Escherichia coli NOT DETECTED NOT DETECTED Final   Klebsiella oxytoca NOT DETECTED NOT DETECTED Final   Klebsiella pneumoniae NOT DETECTED NOT DETECTED Final   Proteus species NOT DETECTED NOT DETECTED Final   Serratia marcescens NOT DETECTED NOT DETECTED Final    Carbapenem resistance NOT DETECTED NOT DETECTED Final   Haemophilus influenzae NOT DETECTED NOT DETECTED Final   Neisseria meningitidis NOT DETECTED NOT DETECTED Final   Pseudomonas aeruginosa NOT DETECTED NOT DETECTED Final   Candida albicans NOT DETECTED NOT DETECTED Final   Candida glabrata NOT DETECTED NOT DETECTED Final   Candida krusei NOT DETECTED NOT DETECTED Final   Candida parapsilosis NOT DETECTED NOT DETECTED Final   Candida tropicalis NOT DETECTED NOT DETECTED Final    Comment: Performed at [facility], [place] [address], [place], [place] [address]  MRSA PCR Screening  Status: None   Collection Time: 03/10/18 10:53 AM  Result Value Ref Range Status   MRSA by PCR NEGATIVE NEGATIVE Final    Comment:        The GeneXpert MRSA Assay (FDA approved for NASAL specimens only), is one component of a comprehensive MRSA colonization surveillance program. It is not intended to diagnose MRSA infection nor to guide or monitor treatment for MRSA infections. Performed at [facility], [place] [address] [place], [place] [address]     Studies/Results: Dg Chest 1 View  Result Date: 03/10/2018 CLINICAL DATA:  Initial evaluation for tachypnea. EXAM: CHEST  1 VIEW COMPARISON:  Prior radiograph from earlier the same day. FINDINGS: Advanced cardiomegaly, stable. Mediastinal silhouette normal. Aortic atherosclerosis. Lungs mildly hypoinflated, with motion artifact at the right hemidiaphragm increased pulmonary vascular congestion with interstitial prominence, consistent with new/developing pulmonary interstitial edema. Scattered Kerley B-lines present at the lung bases. Small right pleural effusion. Superimposed mild right basilar atelectasis. No consolidative opacity. No pneumothorax. No acute osseous abnormality. IMPRESSION: 1. Interval development of diffuse pulmonary interstitial edema with small right pleural effusion. 2. Underlying cardiomegaly with aortic atherosclerosis.  Electronically Signed   By: Jeannine Boga M.D.   On: 03/10/2018 13:32   Mr Lumbar Spine Wo Contrast  Result Date: 03/10/2018 CLINICAL DATA:  57 year old female with weakness. Difficulty walking. Fever of unknown origin, and questionable L5-S1 discitis on CT Abdomen and Pelvis . In stage renal disease on dialysis. EXAM: MRI LUMBAR SPINE WITHOUT CONTRAST TECHNIQUE: Multiplanar, multisequence MR imaging of the lumbar spine was performed. No intravenous contrast was administered. COMPARISON:  CT Abdomen and Pelvis 03/09/2018 and earlier. FINDINGS: Segmentation:  Normal as seen on the CT comparison. Alignment:  Stable since 2014 with preserved lumbar lordosis. Vertebrae: Diffusely abnormal background bone marrow signal, a especially in the lower thoracic and lumbar levels through L4. At L5 and in the visible sacrum, pelvis marrow signal is heterogeneous but with better preserved T1 hyperintensity. Still, there is no marrow edema or evidence of acute osseous abnormality. Intact visible sacrum. Conus medullaris and cauda equina: Conus extends to the L1 level. No lower spinal cord or conus signal abnormality. Capacious spinal canal. Paraspinal and other soft tissues: Atrophied kidneys. Decreased T2 signal in the visible liver and spleen. Partially visible cystic structure in the right hemipelvis, probably ovary related and simple appearing. There is a small focus of edema in the medial left psoas muscle at the L4 level as seen on series 3, images 13 and 14. This correlates to axial series 5, images 27-30 where the muscle appears to remain largely normal. No fluid collection. There is no other paraspinal inflammation. Disc levels: T11-T12: Disc desiccation with mild disc space loss, disc bulge and anterior endplate spurring. No stenosis. T12-L1 through L4-L5: Negative. L5-S1: Chronic severe disc space loss here, vacuum disc in 2014 which was not apparent yesterday. Circumferential disc osteophyte complex with  broad-based posterior and biforaminal involvement. Superimposed epidural lipomatosis. No edema within the epidural fat. Moderate to severe bilateral L5 foraminal stenosis. IMPRESSION: 1. Advanced chronic disc and endplate degeneration at L5-S1 but no evidence of discitis osteomyelitis. 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 2. There is a small area of nonspecific inflammation in the medial left psoas muscle at L4, but there is no evidence of spinal infection. 3. Capacious spinal canal with no degenerative spinal stenosis. There is degenerative bilateral L5 neural foraminal stenosis. Electronically Signed   By: Lemmie Evens  [place] Crane M.D.   On: 03/10/2018 11:37   Dg Chest Port 1 View  Result Date: 03/11/2018 CLINICAL DATA:  Respiratory distress EXAM: PORTABLE CHEST 1 VIEW COMPARISON:  03/10/2018 FINDINGS: Cardiac shadow is mildly enlarged but stable. The lungs are well aerated bilaterally. No focal infiltrate is seen. Persistent blunting of the right costophrenic angle is noted. No bony abnormality is noted. IMPRESSION: Stable blunting of the right costophrenic angle. No other focal abnormality is seen. Electronically Signed   By: Inez Catalina M.D.   On: 03/11/2018 08:53   Dg Chest Port 1 View  Result Date: 03/10/2018 CLINICAL DATA:  Fever and sepsis EXAM: PORTABLE CHEST 1 VIEW COMPARISON:  Chest radiograph 03/09/2018 FINDINGS: Severe cardiomegaly. No focal airspace consolidation or pulmonary edema. No pneumothorax or sizable pleural effusion. IMPRESSION: Severe cardiomegaly without pulmonary edema. No focal consolidation. Electronically Signed   By: Ulyses Jarred M.D.   On: 03/10/2018 05:48     Assessment/Plan: Sepsis MSSA bacteremia AVG ESRD  Total days of antibiotics: 1 ancef  Tmax today 100.5 Await repeat BCx Await TTE reading Await vascular u/s.          Bobby Rumpf MD, FACP Infectious Diseases (pager) [phone] [email or web address] 03/11/2018, 2:41 PM  LOS: 1 day

## 2018-03-11 NOTE — Progress Notes (Signed)
  Echocardiogram 2D Echocardiogram has been performed.  Karen Morrow 03/11/2018, 11:47 AM

## 2018-03-11 NOTE — Progress Notes (Signed)
[facility] Progress Note  Subjective: 2.3 L off w/ HD yesterday, breathing much better today.    Vitals:   03/11/18 0811 03/11/18 0900 03/11/18 1000 03/11/18 1100  BP:  (!) 166/90 (!) 155/113 (!) 154/86  Pulse: (!) 106 (!) 107 (!) 102 (!) 103  Resp: (!) 28 (!) 30 (!) 28 (!) 29  Temp:      TempSrc:      SpO2: 100% 94% 100% 98%  Weight:        Inpatient medications: . chlorhexidine  15 mL Mouth Rinse BID  . Chlorhexidine Gluconate Cloth  6 each Topical Q0600  . heparin  5,000 Units Subcutaneous Q8H  . mouth rinse  15 mL Mouth Rinse q12n4p   . sodium chloride 10 mL/hr at 03/11/18 0700  .  ceFAZolin (ANCEF) IV Stopped (03/10/18 2343)   sodium chloride, acetaminophen **OR** acetaminophen, albuterol  Iron/TIBC/Ferritin/ %Sat    Component Value Date/Time   IRON 45 04/16/2016 1116   TIBC 318 04/16/2016 1116   FERRITIN 894 (H) 04/16/2016 1116   IRONPCTSAT 14 04/16/2016 1116    Exam: General: normal Mouth dry mm's, EOMI, PERRLa Neck: no jvd no bruit Heart: Regular, tachy, 1/6 sem no rg, transmitted bruit from R arm Lungs: improved, mostly clear bilat Abdomen: soft ntnd no mass or ascite +bs Extremities: no wounds or ulcers, trace edema Skin: no rash Neuro: NF, Ox3, a bit weak Dialysis Access: RUA AVF+bruit   home meds:  - amlodipine 10/ metoprolol tartrate 25 bid  - prednisone qd/ pantoprazole 20 bid/ nicotine gum/ calcium acetate tid ac  - albuterol nebs and ACT prn   Dialysis Orders: TTS south .  3h 34min  63.5kg  2/2.5 bath  Hep none   - hect 3 ug  - mircera 30 last on 10/8   Assessment/Plan 1. MSSA bacteremia - w/ high fevers and AMS. MS better today. No gross access infection. W/u per primary/ ID.   2. ESRD -  TTS HD, missed HD Thursday. HD yest here, plan HD again today , get vol down further as tolerated. Under dry wt now but has not stood to weigh 3. Volume - +pulm edema yest, resolving clinically today after HD yest  -2.3L 4. Hypertension - BP's high, cont meds 5. Anemia  Of ckd - Hb 10, got esa recently on 10/8 6. Metabolic bone disease -  Cont meds 7. Nutrition - renal diet   Kelly Splinter MD [facility] pager [phone]   03/11/2018, 12:08 PM   Recent Labs  Lab 03/10/18 0509  03/10/18 1439 03/11/18 0323  NA 133*  --  133* 134*  K 6.2*  --  5.3* 4.8  CL 92*  --  97* 95*  CO2 22  --  20* 22  GLUCOSE 105*  --  109* 70  BUN 59*  --  71* 31*  CREATININE 11.91*   < > 12.68* 7.01*  CALCIUM 9.8  --  9.0 9.1  PHOS  --   --   --  5.8*  ALBUMIN 3.5  --   --  2.9*  INR 0.97  --   --   --    < > = values in this interval not displayed.   Recent Labs  Lab 03/10/18 0509  AST 57*  ALT 30  ALKPHOS 95  BILITOT 1.2  PROT 7.5   Recent Labs  Lab 03/10/18 0509 03/10/18 0854 03/11/18 0323  WBC 8.4 7.8 4.8  NEUTROABS 7.7  --   --  HGB 11.0* 9.7* 10.3*  HCT 33.4* 29.9* 29.5*  MCV 92.0 94.9 91.0  PLT 154 125* 167

## 2018-03-12 DIAGNOSIS — R0603 Acute respiratory distress: Secondary | ICD-10-CM

## 2018-03-12 DIAGNOSIS — J9601 Acute respiratory failure with hypoxia: Secondary | ICD-10-CM

## 2018-03-12 DIAGNOSIS — R6520 Severe sepsis without septic shock: Secondary | ICD-10-CM

## 2018-03-12 LAB — GLUCOSE, CAPILLARY: Glucose-Capillary: 71 mg/dL (ref 70–99)

## 2018-03-12 LAB — CULTURE, BLOOD (ROUTINE X 2)

## 2018-03-12 LAB — CBC WITH DIFFERENTIAL/PLATELET
Abs Immature Granulocytes: 0.05 10*3/uL (ref 0.00–0.07)
Basophils Absolute: 0 10*3/uL (ref 0.0–0.1)
Basophils Relative: 1 %
Eosinophils Absolute: 0.1 10*3/uL (ref 0.0–0.5)
Eosinophils Relative: 2 %
HCT: 29.8 % — ABNORMAL LOW (ref 36.0–46.0)
Hemoglobin: 10.1 g/dL — ABNORMAL LOW (ref 12.0–15.0)
Immature Granulocytes: 1 %
Lymphocytes Relative: 16 %
Lymphs Abs: 0.9 10*3/uL (ref 0.7–4.0)
MCH: 30.8 pg (ref 26.0–34.0)
MCHC: 33.9 g/dL (ref 30.0–36.0)
MCV: 90.9 fL (ref 80.0–100.0)
Monocytes Absolute: 0.4 10*3/uL (ref 0.1–1.0)
Monocytes Relative: 7 %
Neutro Abs: 4.1 10*3/uL (ref 1.7–7.7)
Neutrophils Relative %: 73 %
Platelets: 89 10*3/uL — ABNORMAL LOW (ref 150–400)
RBC: 3.28 MIL/uL — ABNORMAL LOW (ref 3.87–5.11)
RDW: 13.5 % (ref 11.5–15.5)
WBC: 5.6 10*3/uL (ref 4.0–10.5)
nRBC: 0.5 % — ABNORMAL HIGH (ref 0.0–0.2)

## 2018-03-12 LAB — BASIC METABOLIC PANEL
Anion gap: 19 — ABNORMAL HIGH (ref 5–15)
BUN: 57 mg/dL — ABNORMAL HIGH (ref 6–20)
CO2: 21 mmol/L — ABNORMAL LOW (ref 22–32)
Calcium: 9.1 mg/dL (ref 8.9–10.3)
Chloride: 92 mmol/L — ABNORMAL LOW (ref 98–111)
Creatinine, Ser: 9 mg/dL — ABNORMAL HIGH (ref 0.44–1.00)
GFR calc Af Amer: 5 mL/min — ABNORMAL LOW (ref >60)
GFR calc non Af Amer: 4 mL/min — ABNORMAL LOW (ref >60)
Glucose, Bld: 75 mg/dL (ref 70–99)
Potassium: 5 mmol/L (ref 3.5–5.1)
Sodium: 132 mmol/L — ABNORMAL LOW (ref 135–145)

## 2018-03-12 LAB — PHOSPHORUS: Phosphorus: 5.5 mg/dL — ABNORMAL HIGH (ref 2.5–4.6)

## 2018-03-12 MED ORDER — AMLODIPINE BESYLATE 10 MG PO TABS
10.0000 mg | ORAL_TABLET | Freq: Every day | ORAL | Status: DC
  Administered 2018-03-12 – 2018-03-14 (×3): 10 mg via ORAL
  Filled 2018-03-12 (×4): qty 1

## 2018-03-12 MED ORDER — METOPROLOL TARTRATE 25 MG PO TABS
25.0000 mg | ORAL_TABLET | Freq: Two times a day (BID) | ORAL | Status: DC
  Administered 2018-03-12 – 2018-03-15 (×6): 25 mg via ORAL
  Filled 2018-03-12 (×7): qty 1

## 2018-03-12 NOTE — Progress Notes (Signed)
Arrived to patient's room to start hemodialysis treatment.  Patient requested to wait until Sunday am.  She stated that she is tired and her right arm hurts.  RUA AVG site assessed, very warm and tender to touch.  This is a change since 03/10/18 post trmt.  Also, patient has just had a lateral room transfer.  Lung sounds diminished, but clear.   Call placed to Dr. Jonnie Finner to inform him of above.  He agreed and gave order to modify HD trmt order to 03/12/18.  Patient and primary RN notified.

## 2018-03-12 NOTE — Progress Notes (Signed)
Received pt from [facility], pt confused to year and month, pt appears in no distress rr unlabored, mild weakness BUE and BLE. Limited rom right arm pt r/t tender HD fistula, fistula with positive thrill and bruit.

## 2018-03-12 NOTE — Progress Notes (Signed)
PULMONARY / CRITICAL CARE MEDICINE   NAME:  Karen DONALSON, MRN:  [id number], DOB:  [birth year], LOS: 2 ADMISSION DATE:  03/10/2018, CONSULTATION DATE:  03/10/2018 REFERRING MD:  [facility], CHIEF COMPLAINT: Extreme dyspnea, fever  BRIEF HISTORY:         57 year old with end-stage renal disease who presented with fever and weakness.  We are asked to see her for extreme shortness of breath quiring BiPAP for support.  Cultures have grown MSSA.  HISTORY OF PRESENT ILLNESS       Is a 57 year old end-stage renal disease who presented with fever and lower extremity weakness with falling episodes.  She has had an MRI of the thoracic and lumbar spines without any evidence of epidural abscess.  She came to our attention 10/10 when she was hypertensive extremely tachycardic, tachypneic and acutely short of breath.  She was placed on BiPAP and transferred to the intensive care unit for acute hemodialysis.  Following fluid removal her dyspnea improved and this morning she is no longer on BiPAP.  Blood cultures turned positive for early, and she is growing MSSA from the blood.  Source is not overt.  Chest x-ray is really not remarkable and she does not have a cough.  SIGNIFICANT PAST MEDICAL HISTORY   No known needles.  SIGNIFICANT EVENTS:   STUDIES:   Duplex ultrasound right upper extremity 10/12 >> graft patent, unable to rule out infection TTE 10/12 >> EF 65 to 72%, diastolic dysfunction with elevated LV pressures, calcified aortic valve with mild stenosis, mild to moderate regurgitation.  Mildly dilated left atrium.  Speckled myocardium, question amyloidosis   CULTURES:  Blood 10/11 >> MSSA  ANTIBIOTICS:  Cefazolin 10/11 >>   LINES/TUBES:    CONSULTANTS:  Nephrology and infectious disease  SUBJECTIVE:  She is much more comfortable and alert than when seen by me yesterday.  She has no complaint of dyspnea cough or back pain at present.  She thinks that the strength in her legs is much  improved.  CONSTITUTIONAL: BP (!) 151/99   Pulse 92   Temp 98.2 F (36.8 C) (Oral)   Resp (!) 27   Wt 61.4 kg   SpO2 93%   BMI 21.20 kg/m   I/O last 3 completed shifts: In: 473.9 [P.O.:120; I.V.:193.9; IV Piggyback:160] Out: 2351 [Other:2350; Stool:1]    PHYSICAL EXAM: General: Chronically ill, comfortable on room air Neuro: Alert and oriented, interacting normally, very slight lower extremity weakness but able to move to command HEENT: No JVD, oropharynx clear Cardiovascular: Regular, harsh referred fistula sounds heard but no clear murmur Lungs: Distant, clear bilaterally, no rhonchi, no wheezes Abdomen: Soft, nontender, nondistended, positive bowel sounds Musculoskeletal: No deformity Skin: No rash  RESOLVED PROBLEM LIST   ASSESSMENT AND PLAN   57 year old end-stage renal disease patient on dialysis with MSSA bacteremia.  MSSA bacteremia -Appreciate infectious diseases input, cefazolin as per their recommendations -Source unclear, AV graft without any clear evidence for infection but she is at risk.  There has been some pain at the site.  Likely will need vascular evaluation during this hospitalization -Need to decide whether a TTE as indicated  Severe sepsis -due to the above, improving  Acute respiratory failure with hypoxemia, improved.  Presumed due to acute volume overload in the setting of hypertension and diastolic dysfunction.  Improved with blood pressure control and dialysis -Significant improvement after hemodialysis and volume removal -Now off BiPAP, on room air  Hypertension with associated diastolic dysfunction Question amyloidosis -Restart home amlodipine and  metoprolol 10/13  End-stage renal disease on hemodialysis -Planning for hemodialysis again on 10/13.  Her normal schedule is Tuesday Thursday Saturday -Follow BMP -Appreciate nephrology management  Anemia of chronic disease -Follow CBC intermittently  Lower extremity weakness.  No  evidence of discitis or osteomyelitis on MRI, there was severe degenerative arthritis and stenotic change bilateral L5 -Physical therapy -Mobilize as able -May need to consider neurosurgery evaluation depending on improvement given her arthritic changes  SUMMARY OF TODAY'S PLAN:  Hemo-dialysis today Adjust hypertensive medication Consider TEE going forward Probably to floor bed after HD if stable  Best Practice / Goals of Care / Disposition.   DVT PROPHYLAXIS: Subcu heparin SUP: diet begun NUTRITION: MOBILITY: GOALS OF CARE: FAMILY DISCUSSIONS:  DISPOSITION   LABS  Glucose Recent Labs  Lab 03/09/18 0036 03/10/18 0758 03/10/18 1331 03/10/18 1421 03/12/18 0749  GLUCAP 91 77 101* 149* 71    BMET Recent Labs  Lab 03/10/18 1439 03/11/18 0323 03/12/18 0333  NA 133* 134* 132*  K 5.3* 4.8 5.0  CL 97* 95* 92*  CO2 20* 22 21*  BUN 71* 31* 57*  CREATININE 12.68* 7.01* 9.00*  GLUCOSE 109* 70 75    Liver Enzymes Recent Labs  Lab 03/10/18 0509 03/11/18 0323  AST 57*  --   ALT 30  --   ALKPHOS 95  --   BILITOT 1.2  --   ALBUMIN 3.5 2.9*    Electrolytes Recent Labs  Lab 03/10/18 1439 03/11/18 0323 03/12/18 0333  CALCIUM 9.0 9.1 9.1  PHOS  --  5.8* 5.5*    CBC Recent Labs  Lab 03/10/18 0854 03/11/18 0323 03/12/18 0333  WBC 7.8 4.8 5.6  HGB 9.7* 10.3* 10.1*  HCT 29.9* 29.5* 29.8*  PLT 125* 167 89*    ABG Recent Labs  Lab 03/10/18 1301  PHART 7.265*  PCO2ART 45.6  PO2ART 225*    Coag's Recent Labs  Lab 03/10/18 0509  INR 0.97    Sepsis Markers Recent Labs  Lab 03/10/18 0854 03/10/18 1219 03/10/18 1439 03/10/18 2056  LATICACIDVEN  --  2.0* 3.6* 3.4*  PROCALCITON 17.63  --   --   --     Cardiac Enzymes No results for input(s): TROPONINI, PROBNP in the last 168 hours.   Baltazar Apo, MD, PhD 03/12/2018, 9:49 AM [facility] [phone] or if no answer [phone]

## 2018-03-12 NOTE — Progress Notes (Signed)
Report called to [place], pt transferred fully alert no apparent distress rr unlabored vss

## 2018-03-12 NOTE — Progress Notes (Signed)
[facility] Progress Note  Subjective: no c/o   Vitals:   03/12/18 1115 03/12/18 1130 03/12/18 1135 03/12/18 1200  BP: 140/87 (!) 138/91 130/78 (P) 121/75  Pulse: 95 91 89 (P) 91  Resp: (!) 27 (!) 22 (!) 21 (!) (P) 25  Temp: 98.4 F (36.9 C)     TempSrc: Oral     SpO2: 98%     Weight:        Inpatient medications: . amLODipine  10 mg Oral QHS  . Chlorhexidine Gluconate Cloth  6 each Topical Q0600  . heparin  5,000 Units Subcutaneous Q8H  . metoprolol tartrate  25 mg Oral BID  . multivitamin  1 tablet Oral QHS   . sodium chloride Stopped (03/11/18 1700)  .  ceFAZolin (ANCEF) IV 1 g (03/11/18 2117)   sodium chloride, acetaminophen **OR** acetaminophen, albuterol, labetalol  Iron/TIBC/Ferritin/ %Sat    Component Value Date/Time   IRON 45 04/16/2016 1116   TIBC 318 04/16/2016 1116   FERRITIN 894 (H) 04/16/2016 1116   IRONPCTSAT 14 04/16/2016 1116    Exam: General: normal Mouth dry mm's, EOMI, PERRLa Neck: no jvd no bruit Heart: Regular, tachy, 1/6 sem no rg, transmitted bruit from R arm Lungs: improved, mostly clear bilat Abdomen: soft ntnd no mass or ascite +bs Extremities: no wounds or ulcers, trace edema Skin: no rash Neuro: NF, Ox3, a bit weak Dialysis Access: RUA AVF+bruit   home meds:  - amlodipine 10/ metoprolol tartrate 25 bid  - prednisone qd/ pantoprazole 20 bid/ nicotine gum/ calcium acetate tid ac  - albuterol nebs and ACT prn   Dialysis Orders: TTS south .  3h 55min  63.5kg  2/2.5 bath  Hep none   - hect 3 ug  - mircera 30 last on 10/8   Assessment/Plan 1. MSSA bacteremia - w/ high fevers/ AMS.  AMS resolved. Fevers better, unclear source, w/u in progress.  2. ESRD -  TTS HD. TTS today off schedule for Sat.  3. Volume /pulm edema - resolved clinically, under dry, lower dry at dc 4. Hypertension - BP's good, cont meds 5. Anemia  Of ckd - Hb 10, got esa recently on 10/8 6. Metabolic bone disease -  Cont meds 7. Nutrition -  renal diet   Kelly Splinter MD [facility] pager [phone]   03/12/2018, 12:39 PM   Recent Labs  Lab 03/10/18 0509  03/11/18 0323 03/12/18 0333  NA 133*   < > 134* 132*  K 6.2*   < > 4.8 5.0  CL 92*   < > 95* 92*  CO2 22   < > 22 21*  GLUCOSE 105*   < > 70 75  BUN 59*   < > 31* 57*  CREATININE 11.91*   < > 7.01* 9.00*  CALCIUM 9.8   < > 9.1 9.1  PHOS  --   --  5.8* 5.5*  ALBUMIN 3.5  --  2.9*  --   INR 0.97  --   --   --    < > = values in this interval not displayed.   Recent Labs  Lab 03/10/18 0509  AST 57*  ALT 30  ALKPHOS 95  BILITOT 1.2  PROT 7.5   Recent Labs  Lab 03/10/18 0509  03/11/18 0323 03/12/18 0333  WBC 8.4   < > 4.8 5.6  NEUTROABS 7.7  --   --  4.1  HGB 11.0*   < > 10.3* 10.1*  HCT 33.4*   < >  29.5* 29.8*  MCV 92.0   < > 91.0 90.9  PLT 154   < > 167 89*   < > = values in this interval not displayed.

## 2018-03-12 NOTE — Progress Notes (Signed)
Pt off the unit to HD

## 2018-03-13 ENCOUNTER — Inpatient Hospital Stay (HOSPITAL_COMMUNITY): Payer: Medicare Other

## 2018-03-13 ENCOUNTER — Other Ambulatory Visit: Payer: Self-pay

## 2018-03-13 DIAGNOSIS — Z992 Dependence on renal dialysis: Secondary | ICD-10-CM

## 2018-03-13 DIAGNOSIS — I120 Hypertensive chronic kidney disease with stage 5 chronic kidney disease or end stage renal disease: Secondary | ICD-10-CM

## 2018-03-13 DIAGNOSIS — J9601 Acute respiratory failure with hypoxia: Secondary | ICD-10-CM

## 2018-03-13 DIAGNOSIS — A412 Sepsis due to unspecified staphylococcus: Secondary | ICD-10-CM

## 2018-03-13 DIAGNOSIS — R109 Unspecified abdominal pain: Secondary | ICD-10-CM

## 2018-03-13 DIAGNOSIS — I471 Supraventricular tachycardia: Secondary | ICD-10-CM

## 2018-03-13 DIAGNOSIS — R012 Other cardiac sounds: Secondary | ICD-10-CM

## 2018-03-13 DIAGNOSIS — R0781 Pleurodynia: Secondary | ICD-10-CM

## 2018-03-13 DIAGNOSIS — Z79899 Other long term (current) drug therapy: Secondary | ICD-10-CM

## 2018-03-13 DIAGNOSIS — A4101 Sepsis due to Methicillin susceptible Staphylococcus aureus: Secondary | ICD-10-CM

## 2018-03-13 DIAGNOSIS — T82898A Other specified complication of vascular prosthetic devices, implants and grafts, initial encounter: Secondary | ICD-10-CM

## 2018-03-13 DIAGNOSIS — R011 Cardiac murmur, unspecified: Secondary | ICD-10-CM

## 2018-03-13 DIAGNOSIS — B1920 Unspecified viral hepatitis C without hepatic coma: Secondary | ICD-10-CM

## 2018-03-13 DIAGNOSIS — R195 Other fecal abnormalities: Secondary | ICD-10-CM

## 2018-03-13 DIAGNOSIS — N2581 Secondary hyperparathyroidism of renal origin: Secondary | ICD-10-CM

## 2018-03-13 DIAGNOSIS — D631 Anemia in chronic kidney disease: Secondary | ICD-10-CM

## 2018-03-13 DIAGNOSIS — N186 End stage renal disease: Secondary | ICD-10-CM

## 2018-03-13 DIAGNOSIS — Z8711 Personal history of peptic ulcer disease: Secondary | ICD-10-CM

## 2018-03-13 DIAGNOSIS — R1013 Epigastric pain: Secondary | ICD-10-CM

## 2018-03-13 DIAGNOSIS — R1011 Right upper quadrant pain: Secondary | ICD-10-CM

## 2018-03-13 LAB — COMPREHENSIVE METABOLIC PANEL
ALT: 33 U/L (ref 0–44)
AST: 144 U/L — ABNORMAL HIGH (ref 15–41)
Albumin: 2.6 g/dL — ABNORMAL LOW (ref 3.5–5.0)
Alkaline Phosphatase: 131 U/L — ABNORMAL HIGH (ref 38–126)
Anion gap: 13 (ref 5–15)
BUN: 22 mg/dL — ABNORMAL HIGH (ref 6–20)
CO2: 28 mmol/L (ref 22–32)
Calcium: 9 mg/dL (ref 8.9–10.3)
Chloride: 95 mmol/L — ABNORMAL LOW (ref 98–111)
Creatinine, Ser: 4.71 mg/dL — ABNORMAL HIGH (ref 0.44–1.00)
GFR calc Af Amer: 11 mL/min — ABNORMAL LOW (ref >60)
GFR calc non Af Amer: 9 mL/min — ABNORMAL LOW (ref >60)
Glucose, Bld: 97 mg/dL (ref 70–99)
Potassium: 3.5 mmol/L (ref 3.5–5.1)
Sodium: 136 mmol/L (ref 135–145)
Total Bilirubin: 1.1 mg/dL (ref 0.3–1.2)
Total Protein: 6.6 g/dL (ref 6.5–8.1)

## 2018-03-13 LAB — CBC
HCT: 29.6 % — ABNORMAL LOW (ref 36.0–46.0)
Hemoglobin: 9.8 g/dL — ABNORMAL LOW (ref 12.0–15.0)
MCH: 30.1 pg (ref 26.0–34.0)
MCHC: 33.1 g/dL (ref 30.0–36.0)
MCV: 90.8 fL (ref 80.0–100.0)
Platelets: 98 10*3/uL — ABNORMAL LOW (ref 150–400)
RBC: 3.26 MIL/uL — ABNORMAL LOW (ref 3.87–5.11)
RDW: 13.4 % (ref 11.5–15.5)
WBC: 6.8 10*3/uL (ref 4.0–10.5)
nRBC: 1.3 % — ABNORMAL HIGH (ref 0.0–0.2)

## 2018-03-13 LAB — BASIC METABOLIC PANEL
Anion gap: 18 — ABNORMAL HIGH (ref 5–15)
BUN: 51 mg/dL — ABNORMAL HIGH (ref 6–20)
CO2: 23 mmol/L (ref 22–32)
Calcium: 9.1 mg/dL (ref 8.9–10.3)
Chloride: 94 mmol/L — ABNORMAL LOW (ref 98–111)
Creatinine, Ser: 7.95 mg/dL — ABNORMAL HIGH (ref 0.44–1.00)
GFR calc Af Amer: 6 mL/min — ABNORMAL LOW (ref >60)
GFR calc non Af Amer: 5 mL/min — ABNORMAL LOW (ref >60)
Glucose, Bld: 78 mg/dL (ref 70–99)
Potassium: 4.3 mmol/L (ref 3.5–5.1)
Sodium: 135 mmol/L (ref 135–145)

## 2018-03-13 LAB — MAGNESIUM: Magnesium: 2 mg/dL (ref 1.7–2.4)

## 2018-03-13 LAB — HEPATIC FUNCTION PANEL
ALT: 51 U/L — ABNORMAL HIGH (ref 0–44)
AST: 201 U/L — ABNORMAL HIGH (ref 15–41)
Albumin: 2.6 g/dL — ABNORMAL LOW (ref 3.5–5.0)
Alkaline Phosphatase: 110 U/L (ref 38–126)
Bilirubin, Direct: 0.5 mg/dL — ABNORMAL HIGH (ref 0.0–0.2)
Indirect Bilirubin: 0.6 mg/dL (ref 0.3–0.9)
Total Bilirubin: 1.1 mg/dL (ref 0.3–1.2)
Total Protein: 6.3 g/dL — ABNORMAL LOW (ref 6.5–8.1)

## 2018-03-13 LAB — GLUCOSE, CAPILLARY: Glucose-Capillary: 72 mg/dL (ref 70–99)

## 2018-03-13 MED ORDER — CHLORHEXIDINE GLUCONATE CLOTH 2 % EX PADS: 6.0000 | MEDICATED_PAD | Freq: Every day | CUTANEOUS | Status: DC

## 2018-03-13 MED ORDER — PANTOPRAZOLE SODIUM 40 MG PO TBEC
40.0000 mg | DELAYED_RELEASE_TABLET | Freq: Two times a day (BID) | ORAL | Status: DC
  Administered 2018-03-13 – 2018-03-19 (×12): 40 mg via ORAL
  Filled 2018-03-13 (×13): qty 1

## 2018-03-13 MED ORDER — POLYETHYLENE GLYCOL 3350 17 G PO PACK
17.0000 g | PACK | Freq: Every day | ORAL | Status: DC | PRN
  Administered 2018-03-13: 17 g via ORAL
  Filled 2018-03-13: qty 1

## 2018-03-13 MED ORDER — SODIUM CHLORIDE 0.9 % IV SOLN
INTRAVENOUS | Status: DC
  Administered 2018-03-13: 19:00:00 via INTRAVENOUS

## 2018-03-13 MED ORDER — OXYCODONE HCL 5 MG PO TABS
5.0000 mg | ORAL_TABLET | Freq: Once | ORAL | Status: AC
  Administered 2018-03-13: 5 mg via ORAL
  Filled 2018-03-13: qty 1

## 2018-03-13 MED ORDER — GI COCKTAIL ~~LOC~~
30.0000 mL | Freq: Once | ORAL | Status: AC
  Administered 2018-03-13: 30 mL via ORAL
  Filled 2018-03-13: qty 30

## 2018-03-13 NOTE — Progress Notes (Signed)
Pharmacy Antibiotic Note  Karen Morrow is a 57 y.o. female admitted on 03/10/2018 with bacteremia.  Pharmacy has been consulted for Ancef dosing.   ID: MSSA bacteremia. Tmax 99, currently AF. WBC 6.8. Reports back pain, MRI T/L spine negative for infection. TTE report no mention of vegetation.  - h/o untreated HepC - Transthoracic echocardiogram did not show any gross vegetations on the valves.  A speckled pattern of the myocardium was noted.  Rule out amyloidosis.  10/12 Bcx: NG x1 10/11 BCx: MSSA 10/11 MRSA PCR: neg  Vanc 10/11  Zosyn 10/11 Flagyl 10/11 Cefazolin 10/11>>  Plan: Cefazolin 1g IV q24h - can change to 2g qHD when back on schedule Still needs TEE Short HD today as missed Sat then again tomorrow 10/15   Height: 5\' 7"  (170.2 cm) Weight: 133 lb 2.5 oz (60.4 kg) IBW/kg (Calculated) : 61.6  Temp (24hrs), Avg:98.3 F (36.8 C), Min:97.6 F (36.4 C), Max:99 F (37.2 C)  Recent Labs  Lab 03/10/18 0509 03/10/18 0519 03/10/18 0841 03/10/18 0854 03/10/18 1219 03/10/18 1439 03/10/18 2056 03/11/18 0323 03/12/18 0333 03/13/18 0447  WBC 8.4  --   --  7.8  --   --   --  4.8 5.6 6.8  CREATININE 11.91*  --   --  12.02*  --  12.68*  --  7.01* 9.00* 7.95*  LATICACIDVEN  --  2.18* 3.04*  --  2.0* 3.6* 3.4*  --   --   --     Estimated Creatinine Clearance: 7.4 mL/min (A) (by C-G formula based on SCr of 7.95 mg/dL (H)).    Allergies  Allergen Reactions  . Cefazolin Other (See Comments)    Severe thrombocytopenia (has tolerated zosyn in the past) ID aware   Karen Morrow, PharmD, [facility] Clinical Staff Pharmacist [phone]  Karen Morrow 03/13/2018 2:11 PM

## 2018-03-13 NOTE — Progress Notes (Addendum)
Subjective: No overnight events. Karen Morrow reports that she feels much better today and that her abdominal pain was relieved by tylenol. She also denies back pain. She reports that her right AVF is painful and she doesn't want dialysis today because it hurts to access it. She has no other new symptoms or concerns today.   Objective:  Vital signs in last 24 hours: Vitals:   03/12/18 1546 03/12/18 1600 03/12/18 1700 03/12/18 2000  BP:  (!) 148/88 (!) 167/94 (!) 152/101  Pulse:  (!) 102 (!) 101 (!) 102  Resp:  (!) 22 (!) 35 (!) 31  Temp: 99 F (37.2 C)   98.7 F (37.1 C)  TempSrc: Oral   Oral  SpO2:  96% 94% 97%  Weight: 61.1 kg     Height: _0  (1.702 m)      Physical Exam Constitutional: Sitting up comfortably in bed. Cardiovascular: Large RUSB continuous bruit, likely radiating from the RUE AVF.  Pulmonary/Chest: Effort normal. Clear to auscultation bilaterally. No wheezes, rales, or rhonchi. Abdominal: Bowel sounds present. Soft, non-distended, non-tender. Ext: RUE AVF is dilated, but has no erythema, increased warmth, or drainage.  Skin: Warm and dry. No rashes or wounds.  Assessment/Plan:  Active Problems:   ESRD (end stage renal disease) ([place])   Sepsis ([facility])   Hyperkalemia   Acute respiratory distress  Karen Morrow a 57 y.o.female.with PMHx significant for HTN, ESRD on HD ([facility]), anemia, and secondary hyperparathyroidism who presented with fever, generalized weakness and altered mental status as well as a missed dialysis session. She was admitted to the floor for sepsis and treatment with broad spectrum antibiotics was initiated. She was also initially hyperkalemic with EKG showing peaked T waves for which she received insulin and glucose. Shortly after admission, she became altered and required Bipap for respiratory distress. She was found to be hypertensive and in SVT with heart rates reaching 180bpm. She was stabilized and transferred to the ICU for immediate  hemodialysis with improvement in her respiratory status. Blood cultures collected 10/11 grew MSSA for which she is receiving Ancef. Source is suspected to be her RUE AVF. MRI without discitis, CXR without concern for PNA, anuric so low concern for UTI, and exam without obvious cellulitis.   Sepsis -10/12 cultures with NGTD. Patient is currently afebrile, has no leukocytosis, and is back to baseline mental status.  - Currently receiving Ancef for MSSA bacteremia (day 5) - Vascular surgery evaluated the AVF and thinks infection is unlikely.  - TTE was negative for vegetations. Will get TEE today to evaluate for endocarditis. Plan - Continue Ancef (day 5) - TEE today  Epigastric and RUQ Pain - Her abdominal pain has resolved. - Has history of gastric ulcers in 2016. Patient has chronic anemia 2/2 kidney disease and positive heme-occult on admission, but no acute drop in Hb to suggest GI bleed. No relief with GI cocktail. Started protonix 10/14. - Has history of Hep C which has never been treated. - On admission, liver function and abdominal CT were normal. Repeated hepatic function panel yesterday, which showed increased elevation of AST, normal ALT, and newly elevated alk phos (as compared to admission labs). New liver abnormalities are most likely secondary to her bacteremia. Plan - Protonix 15m BID - Miralax PRN  ESRD on HD ([facility]) - Last dialysis yesterday. This was planned to be a short session for resumption of normal schedule today. Still using the RUE AVF for access during dialysis.  Plan - HD today -  Aranesp with dialysis for anemia   HTN - Blood pressures have been well-controlled on home antihypertensives + dialysis Plan - Continue home amlodipine and metoprolol   Dispo: Anticipated discharge pending infectious work-up.  Dorrell, Andree Elk, MD 03/13/2018, 6:36 AM Pager: [phone]

## 2018-03-13 NOTE — Plan of Care (Signed)
  Problem: Education: Goal: Knowledge of General Education information will improve Description Including pain rating scale, medication(s)/side effects and non-pharmacologic comfort measures Outcome: Progressing   Problem: Nutrition: Goal: Adequate nutrition will be maintained Outcome: Progressing   Problem: Coping: Goal: Level of anxiety will decrease Outcome: Progressing   Problem: Elimination: Goal: Will not experience complications related to bowel motility Outcome: Progressing  Pt anuric Problem: Pain Managment: Goal: General experience of comfort will improve Outcome: Progressing  Tylenol PRN Problem: Safety: Goal: Ability to remain free from injury will improve Outcome: Progressing

## 2018-03-13 NOTE — Progress Notes (Signed)
[facility] Progress Note  Subjective: c/o left upper abd pain, no n/v/d no fevers no radiation, has had this pain before, has had it all night. HD cut short yest due to emergency, got 30 min  Vitals:   03/12/18 1546 03/12/18 1600 03/12/18 1700 03/12/18 2000  BP:  (!) 148/88 (!) 167/94 (!) 152/101  Pulse:  (!) 102 (!) 101 (!) 102  Resp:  (!) 22 (!) 35 (!) 31  Temp: 99 F (37.2 C)   98.7 F (37.1 C)  TempSrc: Oral   Oral  SpO2:  96% 94% 97%  Weight: 61.1 kg     Height: 5\' 7"  (1.702 m)       Inpatient medications: . amLODipine  10 mg Oral QHS  . Chlorhexidine Gluconate Cloth  6 each Topical Q0600  . heparin  5,000 Units Subcutaneous Q8H  . metoprolol tartrate  25 mg Oral BID  . multivitamin  1 tablet Oral QHS   . sodium chloride Stopped (03/11/18 1700)  .  ceFAZolin (ANCEF) IV 1 g (03/13/18 0031)   sodium chloride, acetaminophen **OR** acetaminophen, albuterol, labetalol  Iron/TIBC/Ferritin/ %Sat    Component Value Date/Time   IRON 45 04/16/2016 1116   TIBC 318 04/16/2016 1116   FERRITIN 894 (H) 04/16/2016 1116   IRONPCTSAT 14 04/16/2016 1116    Exam: General: normal Mouth dry mm's, EOMI, PERRLa Neck: no jvd no bruit Heart: Regular, tachy, 1/6 sem no rg, transmitted bruit from R arm Lungs: improved, mostly clear bilat Abdomen: soft ntnd no mass or ascite +bs Extremities: no wounds or ulcers, trace edema Skin: no rash Neuro: NF, Ox3, a bit weak Dialysis Access: RUA AVF+bruit   home meds:  - amlodipine 10/ metoprolol tartrate 25 bid  - prednisone qd/ pantoprazole 20 bid/ nicotine gum/ calcium acetate tid ac  - albuterol nebs and ACT prn   Dialysis Orders: [place] .  3h 38min  63.5kg  2/2.5 bath  Hep none   - hect 3 ug  - mircera 30 last on 10/8   Assessment/Plan 1. MSSA bacteremia - w/ high fevers/ AMS.  AMS resolved. Fevers better, unclear source, w/u in progress.  2. L sided abd pain - chronic? Had neg Abd CT on admission 3. ESRD -   TTS HD. Short HD today as missed Sat the again tomorrow get back on sched 4. Volume /pulm edema - wts and vol; down, CHF resolved, cont to lower dry wt 5. Hypertension - BP's up, cont meds 6. Anemia  Of ckd - Hb 10, got esa recently on 10/8 7. Metabolic bone disease -  Cont meds 8. Nutrition - renal diet   Kelly Splinter MD [facility] pager [phone]   03/13/2018, 6:47 AM   Recent Labs  Lab 03/10/18 0509  03/11/18 0323 03/12/18 0333 03/13/18 0447  NA 133*   < > 134* 132* 135  K 6.2*   < > 4.8 5.0 4.3  CL 92*   < > 95* 92* 94*  CO2 22   < > 22 21* 23  GLUCOSE 105*   < > 70 75 78  BUN 59*   < > 31* 57* 51*  CREATININE 11.91*   < > 7.01* 9.00* 7.95*  CALCIUM 9.8   < > 9.1 9.1 9.1  PHOS  --   --  5.8* 5.5*  --   ALBUMIN 3.5  --  2.9*  --   --   INR 0.97  --   --   --   --    < > =  values in this interval not displayed.   Recent Labs  Lab 03/10/18 0509  AST 57*  ALT 30  ALKPHOS 95  BILITOT 1.2  PROT 7.5   Recent Labs  Lab 03/10/18 0509  03/12/18 0333 03/13/18 0447  WBC 8.4   < > 5.6 6.8  NEUTROABS 7.7  --  4.1  --   HGB 11.0*   < > 10.1* 9.8*  HCT 33.4*   < > 29.8* 29.6*  MCV 92.0   < > 90.9 90.8  PLT 154   < > 89* 98*   < > = values in this interval not displayed.

## 2018-03-13 NOTE — Progress Notes (Signed)
    [facility] has been requested to perform a transesophageal echocardiogram on Karen Morrow for bacteremia.  After careful review of history and examination, the risks and benefits of transesophageal echocardiogram have been explained including risks of esophageal damage, perforation (1:10,000 risk), bleeding, pharyngeal hematoma as well as other potential complications associated with conscious sedation including aspiration, arrhythmia, respiratory failure and death. Alternatives to treatment were discussed, questions were answered. Patient is willing to proceed.  TEE - Dr. Radford Pax @ 1100 . NPO after midnight. Meds with sips.   Leanor Kail, PA-C 03/13/2018 3:40 PM

## 2018-03-13 NOTE — Progress Notes (Signed)
INFECTIOUS DISEASE PROGRESS NOTE  ID: Karen Morrow is a 57 y.o. female with  Active Problems:   ESRD (end stage renal disease) ([place])   Sepsis ([place])   Hyperkalemia   Acute respiratory distress  Subjective: C/o abd pain.   Abtx:  Anti-infectives (From admission, onward)   Start     Dose/Rate Route Frequency Ordered Stop   03/10/18 2300  ceFAZolin (ANCEF) IVPB 1 g/50 mL premix     1 g 100 mL/hr over 30 Minutes Intravenous Daily at bedtime 03/10/18 2220     03/10/18 1415  piperacillin-tazobactam (ZOSYN) IVPB 3.375 g  Status:  Discontinued     3.375 g 12.5 mL/hr over 240 Minutes Intravenous Every 12 hours 03/10/18 1406 03/10/18 1854   03/10/18 1406  vancomycin variable dose per unstable renal function (pharmacist dosing)  Status:  Discontinued      Does not apply See admin instructions 03/10/18 1407 03/10/18 1854   03/10/18 0545  metroNIDAZOLE (FLAGYL) IVPB 500 mg  Status:  Discontinued     500 mg 100 mL/hr over 60 Minutes Intravenous Every 8 hours 03/10/18 0535 03/10/18 0956   03/10/18 0545  vancomycin (VANCOCIN) IVPB 1000 mg/200 mL premix     1,000 mg 200 mL/hr over 60 Minutes Intravenous  Once 03/10/18 0535 03/10/18 0643      Medications:  Scheduled: . amLODipine  10 mg Oral QHS  . Chlorhexidine Gluconate Cloth  6 each Topical Q0600  . Chlorhexidine Gluconate Cloth  6 each Topical Q0600  . Chlorhexidine Gluconate Cloth  6 each Topical Q0600  . gi cocktail  30 mL Oral Once  . heparin  5,000 Units Subcutaneous Q8H  . metoprolol tartrate  25 mg Oral BID  . multivitamin  1 tablet Oral QHS  . pantoprazole  40 mg Oral BID    Objective: Vital signs in last 24 hours: Temp:  [98.4 F (36.9 C)-99 F (37.2 C)] 98.7 F (37.1 C) (10/13 2000) Pulse Rate:  [89-102] 91 (10/14 0908) Resp:  [20-35] 31 (10/13 2000) BP: (121-167)/(75-101) 146/93 (10/14 0908) SpO2:  [94 %-98 %] 97 % (10/14 0908) Weight:  [60.9 kg-61.2 kg] 60.9 kg (10/14 0500)   General appearance: alert,  cooperative and no distress Resp: clear to auscultation bilaterally Cardio: regular rate and rhythm and systolic murmur: holosystolic 4/6, crescendo at 2nd left intercostal space, at 2nd right intercostal space GI: normal findings: bowel sounds normal and soft, non-tender  Lab Results Recent Labs    03/12/18 0333 03/13/18 0447  WBC 5.6 6.8  HGB 10.1* 9.8*  HCT 29.8* 29.6*  NA 132* 135  K 5.0 4.3  CL 92* 94*  CO2 21* 23  BUN 57* 51*  CREATININE 9.00* 7.95*   Liver Panel Recent Labs    03/11/18 0323 03/13/18 0447  PROT  --  6.3*  ALBUMIN 2.9* 2.6*  AST  --  201*  ALT  --  51*  ALKPHOS  --  110  BILITOT  --  1.1  BILIDIR  --  0.5*  IBILI  --  0.6   Sedimentation Rate Recent Labs    03/10/18 1219  ESRSEDRATE 21   C-Reactive Protein Recent Labs    03/10/18 1219  CRP 3.4*    Microbiology: Recent Results (from the past 240 hour(s))  Culture, blood (Routine x 2)     Status: Abnormal   Collection Time: 03/10/18  5:06 AM  Result Value Ref Range Status   Specimen Description BLOOD LEFT ANTECUBITAL  Final  Special Requests   Final    BOTTLES DRAWN AEROBIC AND ANAEROBIC Blood Culture results may not be optimal due to an inadequate volume of blood received in culture bottles   Culture  Setup Time   Final    GRAM POSITIVE COCCI IN CLUSTERS IN BOTH AEROBIC AND ANAEROBIC BOTTLES CRITICAL VALUE NOTED.  VALUE IS CONSISTENT WITH PREVIOUSLY REPORTED AND CALLED VALUE.    Culture (A)  Final    STAPHYLOCOCCUS AUREUS SUSCEPTIBILITIES PERFORMED ON PREVIOUS CULTURE WITHIN THE LAST 5 DAYS. Performed at [facility], [place] [address] [place], [place] [address]    Report Status 03/12/2018 FINAL  Final  Culture, blood (Routine x 2)     Status: Abnormal   Collection Time: 03/10/18  6:21 AM  Result Value Ref Range Status   Specimen Description BLOOD LEFT HAND  Final   Special Requests   Final    BOTTLES DRAWN AEROBIC AND ANAEROBIC Blood Culture results may not be optimal  due to an inadequate volume of blood received in culture bottles   Culture  Setup Time   Final    GRAM POSITIVE COCCI IN CLUSTERS IN BOTH AEROBIC AND ANAEROBIC BOTTLES CRITICAL RESULT CALLED TO, READ BACK BY AND VERIFIED WITH: C. PIERCE, PHARMD AT 1705 ON 03/10/18 BY C. JESSUP, MLT. Performed at [facility], [place] [address] [place], [place] [address]    Culture STAPHYLOCOCCUS AUREUS (A)  Final   Report Status 03/12/2018 FINAL  Final   Organism ID, Bacteria STAPHYLOCOCCUS AUREUS  Final      Susceptibility   Staphylococcus aureus - MIC*    CIPROFLOXACIN <=0.5 SENSITIVE Sensitive     ERYTHROMYCIN <=0.25 SENSITIVE Sensitive     GENTAMICIN <=0.5 SENSITIVE Sensitive     OXACILLIN <=0.25 SENSITIVE Sensitive     TETRACYCLINE <=1 SENSITIVE Sensitive     VANCOMYCIN <=0.5 SENSITIVE Sensitive     TRIMETH/SULFA <=10 SENSITIVE Sensitive     CLINDAMYCIN <=0.25 SENSITIVE Sensitive     RIFAMPIN <=0.5 SENSITIVE Sensitive     Inducible Clindamycin NEGATIVE Sensitive     * STAPHYLOCOCCUS AUREUS  Blood Culture ID Panel (Reflexed)     Status: Abnormal   Collection Time: 03/10/18  6:21 AM  Result Value Ref Range Status   Enterococcus species NOT DETECTED NOT DETECTED Final   Vancomycin resistance NOT DETECTED NOT DETECTED Final   Listeria monocytogenes NOT DETECTED NOT DETECTED Final   Staphylococcus species DETECTED (A) NOT DETECTED Final    Comment: CRITICAL RESULT CALLED TO, READ BACK BY AND VERIFIED WITH: C. PIERCE, PHARMD AT 1705 ON 03/10/18 BY C. JESSUP, MLT.    Staphylococcus aureus (BCID) DETECTED (A) NOT DETECTED Final    Comment: Methicillin (oxacillin) susceptible Staphylococcus aureus (MSSA). Preferred therapy is anti staphylococcal beta lactam antibiotic (Cefazolin or Nafcillin), unless clinically contraindicated. CRITICAL RESULT CALLED TO, READ BACK BY AND VERIFIED WITH: C. PIERCE, PHARMD AT 1705 ON 03/10/18 BY C. JESSUP, MLT.    Methicillin resistance NOT DETECTED NOT DETECTED  Final   Streptococcus species NOT DETECTED NOT DETECTED Final   Streptococcus agalactiae NOT DETECTED NOT DETECTED Final   Streptococcus pneumoniae NOT DETECTED NOT DETECTED Final   Streptococcus pyogenes NOT DETECTED NOT DETECTED Final   Acinetobacter baumannii NOT DETECTED NOT DETECTED Final   Enterobacteriaceae species NOT DETECTED NOT DETECTED Final   Enterobacter cloacae complex NOT DETECTED NOT DETECTED Final   Escherichia coli NOT DETECTED NOT DETECTED Final   Klebsiella oxytoca NOT DETECTED NOT DETECTED Final   Klebsiella  pneumoniae NOT DETECTED NOT DETECTED Final   Proteus species NOT DETECTED NOT DETECTED Final   Serratia marcescens NOT DETECTED NOT DETECTED Final   Carbapenem resistance NOT DETECTED NOT DETECTED Final   Haemophilus influenzae NOT DETECTED NOT DETECTED Final   Neisseria meningitidis NOT DETECTED NOT DETECTED Final   Pseudomonas aeruginosa NOT DETECTED NOT DETECTED Final   Candida albicans NOT DETECTED NOT DETECTED Final   Candida glabrata NOT DETECTED NOT DETECTED Final   Candida krusei NOT DETECTED NOT DETECTED Final   Candida parapsilosis NOT DETECTED NOT DETECTED Final   Candida tropicalis NOT DETECTED NOT DETECTED Final    Comment: Performed at [facility], [place] [address], [place], [place] [address]  MRSA PCR Screening     Status: None   Collection Time: 03/10/18 10:53 AM  Result Value Ref Range Status   MRSA by PCR NEGATIVE NEGATIVE Final    Comment:        The GeneXpert MRSA Assay (FDA approved for NASAL specimens only), is one component of a comprehensive MRSA colonization surveillance program. It is not intended to diagnose MRSA infection nor to guide or monitor treatment for MRSA infections. Performed at [facility], [place] [address], [place], [place] [address]   Culture, blood (Routine X 2) w Reflex to ID Panel     Status: None (Preliminary result)   Collection Time: 03/11/18  7:26 AM  Result Value Ref Range Status   Specimen  Description BLOOD LEFT HAND  Final   Special Requests   Final    BOTTLES DRAWN AEROBIC ONLY Blood Culture results may not be optimal due to an inadequate volume of blood received in culture bottles   Culture   Final    NO GROWTH 1 DAY Performed at [facility], [place] [address] [place], [place] [address]    Report Status PENDING  Incomplete    Studies/Results: No results found.   Assessment/Plan: Sepsis MSSA bacteremia AVG ESRD  Total days of antibiotics: 3 ancef  Continue ancef Repeat BCx are ngtd (10-12) Her vascular u/s is "unable to r/o infection".  Agree with having vascular see her.  Would check TEE, she has a significant murmur Will defer her peluritic CP and abd pain eval to her primary team.          Bobby Rumpf MD, FACP Infectious Diseases (pager) [phone] [email or web address] 03/13/2018, 9:54 AM  LOS: 3 days

## 2018-03-13 NOTE — Progress Notes (Signed)
Medicine attending: Transfer back to medical service note. I examined this patient today and reviewed all pertinent clinical and laboratory data and I attest to and concur with the evaluation and management plan as recorded in the transfer of service note by resident physician Dr. Cooper Render.  57 year old woman with end-stage renal disease on dialysis who was admitted on October 11 with signs and symptoms of sepsis.  Source unclear from exam.  Right proximal arm AV fistula.  Loud pansystolic bruit over the entire right pectoral area likely related to the adjacent fistula.  Difficult to determine whether there is an underlying murmur related to 1 of her heart valves.  No gross evidence for infection of the fistula but was suspected as a potential source. She was given broad-spectrum parenteral antibiotics in the ED as well as fluid resuscitation.  Later that day she developed acute respiratory distress and went into supraventricular tachycardia.  Chest x-ray with pulmonary edema.  She was given labetalol.  BiPAP oxygen.  She needed urgent dialysis.  Critical care consultation was obtained and she was transferred to the intensive care unit where dialysis was done at the bedside.  Condition stabilized.  She did not require intubation.  Blood cultures grew methicillin sensitive Staphylococcus.  She was evaluated by infectious disease.  Transthoracic echocardiogram did not show any gross vegetations on the valves.  A speckled pattern of the myocardium was noted.  Rule out amyloidosis. Her AV fistula remains patent and is still being used for dialysis.  Her main complaint today is abdominal pain.  She is tender in the epigastrium and right upper quadrant.  History of untreated hepatitis C.  History of peptic ulcer disease.  We will give her a GI cocktail.  We will start a PPI.  Check liver functions. The fistula is covered with tape which we did not remove.  Further evaluation by vascular surgery is planned to  make a determination on whether or not the fistula should be removed.  Continue parenteral antibiotics for staph sepsis.  Survey blood cultures.  TEE. Consider preliminary evaluation to rule out amyloidosis as etiology of her diastolic cardiac dysfunction in view of the echocardiogram findings. Discussed with the medical team.

## 2018-03-13 NOTE — Progress Notes (Signed)
Transfer note. Karen P Morrow a 57 y.o.female.with PMHx significant for HTN, ESRD(T.T.S), anemia and secondary hyperparathyroidism brought to ED with fever, generalized weakness and altered mental status. She was found to be febrile, tachycardic and tachypneic, lactic acidosis, hyperkalemia with no leukocytosis but neutrophilic predominance at 28%, patient met SIRS criteria and was treated for sepsis due to unknown source in ED. CT abdomen and MRI of thoracic toxic and lumbar spine does not show any evidence of discitis, abscess or osteomyelitis. CXR without any sign of pneumonia, no obvious sign of infection at fistula site, always a suspicion for ESRD patient.  Later in the day she came acutely dyspneic with increased work of breathing, tachycardic and tachypneic with respiratory rate in 50s with no improvement after placing patient on BiPAP, requiring transfer to ICU. Patient need emergent dialysis, her dyspnea improved after dialysis. She grew pansensitive MSSA in her blood culture.  Most likely presumed infection site is her fistula.  Her antibiotics were switched to cefazolin per ID. Repeat blood culture on March 11, 2018 is negative.  TTE is negative for any vegetation but shows speckled appearance consistent with amyloidosis. Patient remained afebrile, saturating well on room air and was transferred back to IMTS.  Subjective: She was complaining of abdominal pain for the past 2 days.  Denies any nausea, vomiting or diarrhea.  Could not explain when was her last bowel movement.  Patient denies any pain at fistula site, getting dialysis through her fistula.  Objective:  Vital signs in last 24 hours: Vitals:   03/12/18 1600 03/12/18 1700 03/12/18 2000 03/13/18 0500  BP: (!) 148/88 (!) 167/94 (!) 152/101   Pulse: (!) 102 (!) 101 (!) 102   Resp: (!) 22 (!) 35 (!) 31   Temp:   98.7 F (37.1 C)   TempSrc:   Oral   SpO2: 96% 94% 97%   Weight:    60.9 kg  Height:       General.   Well-developed, ill-appearing lady, lying in her bed, in no acute distress. Lungs.  Mildly decreased breath sound at right base, no wheezing or rhonchi. CV.  Regular rate and rhythm with continuous murmur most likely transmitted bruit from her right arm fistula. Abdomen.  Soft, epigastric tenderness, nondistended, no guarding, bowel sounds positive. Extremities.  No edema, no cyanosis, pulses intact and symmetrical.  Assessment/Plan:  Karen Morrow a 57 y.o.female.with PMHx significant for HTN, ESRD(T.T.S), anemia and secondary hyperparathyroidism brought to ED with fever, generalized weakness and altered mental status.  MSSA bacteremia.  Repeat blood culture remain negative and 24-hour. Patient remained afebrile.  Tolerating cefazolin, although listed in her allergies?? TTE negative for any vegetations. ID is following, will put their recommendations regarding TEE after seeing the patient today. No obvious sign of infection at fistula site-vascular surgery was consulted for further evaluation, we appreciate their recommendations. -Continue cefazolin.  Epigastric pain.  Patient with history of gastric ulcers diagnosed on EGD done in 2016 and 18.  Not on any Protonix at this time. Last bowel movement recorded on March 11, 2018. -Hepatic function panel. -GI cocktail. -Protonix 40 mg twice daily. -MiraLAX as needed.  Acute respiratory failure with hypoxemia.  Most likely secondary to volume overload with hypertension and diastolic dysfunction.  Improved after getting dialysis.  Currently saturating well on room air. -Continue albuterol as needed.  ESRD.  Unable to complete her dialysis session on Saturday due to an emergency.  Going for a shorter dialysis session today. Will resume her normal schedule  on Tuesday, Thursday and Saturday from tomorrow.  Anemia.  Her FOBT was positive.  History of gastric ulcers in 2016 and 18 found during EGD when patient presented with melena.  No  colonoscopy in the system.  She also has anemia of chronic illness due to ESRD. On Aranesp with dialysis. Will need colonoscopy as an outpatient.  Hypertension.  Continue to have elevated blood pressure with intermittent improvement after dialysis. -Continue amlodipine and metoprolol.  History of hep C.  She was found to be positive for hep C in 2014 and in 2018.  In 2015 she was having positive viral load and genotype was done. Patient never got the treatment. Needs to follow-up as an outpatient.  Dispo: Anticipated discharge in approximately 2-3 day(s).   Karen Nimrod, MD 03/13/2018, 7:45 AM Pager: [phone]

## 2018-03-13 NOTE — Consult Note (Addendum)
[facility] NOTE   MRN : [id number]  Reason for Consult: right AV graft infection Referring Physician: Dr. Reesa Chew  History of Present Illness: 57 y/o female with ESRD who had an Excision of 2 large aneurysms of right upper arm AV fistula with revision of fistula using interposition 8 mm PTFE graft by Dr. Scot Dock on 01/28/2018.  She presented with 2 large aneurysms of her right upper arm AV fistula. It was felt that she was at risk for continued expansion and bleeding and was set up for possible plication of these aneurysms.     She was last seen in our office by Dr. Scot Dock on 03/09/2018.  She was doing well without problems on that visit.  She was able to use the graft above and below the new incisions for HD.     She has been diagnosed with MSSA bacteremia and unknown source.  We have been asked to examine the right arm AV graft area for signs of infection.  Currently managed on IV ancef.  Since admission her Tm was 103.  She denise right UE pain, edema and no open wounds.   Past medical history includes: HTN, Hep C, ESRD and asthma.   Current Facility-Administered Medications  Medication Dose Route Frequency Provider Last Rate Last Dose  . 0.9 %  sodium chloride infusion   Intravenous PRN Collene Gobble, MD   Stopped at 03/11/18 1700  . acetaminophen (TYLENOL) tablet 650 mg  650 mg Oral Q6H PRN Collene Gobble, MD   650 mg at 03/13/18 7741   Or  . acetaminophen (TYLENOL) suppository 650 mg  650 mg Rectal Q6H PRN Collene Gobble, MD   650 mg at 03/11/18 0047  . albuterol (PROVENTIL) (2.5 MG/3ML) 0.083% nebulizer solution 2.5 mg  2.5 mg Nebulization Q4H PRN Collene Gobble, MD   2.5 mg at 03/10/18 1259  . amLODipine (NORVASC) tablet 10 mg  10 mg Oral QHS Collene Gobble, MD   10 mg at 03/12/18 2329  . ceFAZolin (ANCEF) IVPB 1 g/50 mL premix  1 g Intravenous QHS Collene Gobble, MD 100 mL/hr at 03/13/18 0031 1 g at 03/13/18 0031  . Chlorhexidine Gluconate  Cloth 2 % PADS 6 each  6 each Topical Q0600 Collene Gobble, MD   6 each at 03/12/18 2110  . Chlorhexidine Gluconate Cloth 2 % PADS 6 each  6 each Topical Q0600 Roney Jaffe, MD      . Chlorhexidine Gluconate Cloth 2 % PADS 6 each  6 each Topical Q0600 Roney Jaffe, MD      . heparin injection 5,000 Units  5,000 Units Subcutaneous Q8H Collene Gobble, MD   5,000 Units at 03/13/18 [phone]  . labetalol (NORMODYNE,TRANDATE) injection 10 mg  10 mg Intravenous Q2H PRN Collene Gobble, MD   10 mg at 03/12/18 0338  . metoprolol tartrate (LOPRESSOR) tablet 25 mg  25 mg Oral BID Collene Gobble, MD   25 mg at 03/13/18 0818  . multivitamin (RENA-VIT) tablet 1 tablet  1 tablet Oral QHS Collene Gobble, MD   1 tablet at 03/12/18 2328  . pantoprazole (PROTONIX) EC tablet 40 mg  40 mg Oral BID Lorella Nimrod, MD   40 mg at 03/13/18 1020  . polyethylene glycol (MIRALAX / GLYCOLAX) packet 17 g  17 g Oral Daily PRN Lorella Nimrod, MD        Pt meds include: Statin :No Betablocker: Yes ASA: No Other  anticoagulants/antiplatelets: None  Past Medical History:  Diagnosis Date  . Anginal pain ([place])   . Asthma   . Chronic kidney disease    dialysis 3x wk  . Dyspnea   . Frequent bowel movements   . GERD (gastroesophageal reflux disease)   . Hepatitis C antibody test positive   . Hypertension    "just dx'd today" (11/03/2012)    Past Surgical History:  Procedure Laterality Date  . ABDOMINAL HYSTERECTOMY     "partial" (11/03/2012)  . AV FISTULA PLACEMENT Right 12/03/2013   Procedure: CREATION OF ARTERIOVENOUS (AV) FISTULA RIGHT ARM ;  Surgeon: Rosetta Posner, MD;  Location: [facility];  Service: Vascular;  Laterality: Right;  . BASCILIC VEIN TRANSPOSITION Right 03/06/2014   Procedure: SECOND STAGE BASILIC VEIN TRANSPOSITION;  Surgeon: Rosetta Posner, MD;  Location: [facility] OR;  Service: Vascular;  Laterality: Right;  . ESOPHAGOGASTRODUODENOSCOPY N/A 06/05/2013   Procedure: ESOPHAGOGASTRODUODENOSCOPY (EGD);  Surgeon: Winfield Cunas., MD;  Location: [facility];  Service: Endoscopy;  Laterality: N/A;  . ESOPHAGOGASTRODUODENOSCOPY N/A 09/08/2016   Procedure: ESOPHAGOGASTRODUODENOSCOPY (EGD);  Surgeon: Milus Banister, MD;  Location: [facility];  Service: Endoscopy;  Laterality: N/A;  . REVISON OF ARTERIOVENOUS FISTULA Right 01/28/2017   Procedure: REVISON OF RIGHT ARM ARTERIOVENOUS FISTULA USING 8MMX10CM GORETEX GRAFT;  Surgeon: Angelia Mould, MD;  Location: [facility] OR;  Service: Vascular;  Laterality: Right;    Social History Social History   Tobacco Use  . Smoking status: Light Tobacco Smoker    Packs/day: 0.50    Years: 20.00    Pack years: 10.00    Types: Cigarettes  . Smokeless tobacco: Never Used  Substance Use Topics  . Alcohol use: Yes    Alcohol/week: 1.0 standard drinks    Types: 1 Glasses of wine per week    Comment: 09/07/16-pt reports less than 1 glass of wine/week. 11/03/2012 "1-2 40oz beers/day maybe"   11/17/15 "I don;t drink beer anymore since Ive been on dialysis". p  . Drug use: Yes    Types: Marijuana    Comment: 11/03/2012 "maybe once or twice/wk" 11/17/15 "I haven't smoked in 3-4 months"    Family History Family History  Problem Relation Age of Onset  . Lupus Mother   . Diabetes Father   . Heart attack Father     Allergies  Allergen Reactions  . Cefazolin Other (See Comments)    Severe thrombocytopenia (has tolerated zosyn in the past) ID aware     REVIEW OF SYSTEMS  General: [ ]  Weight loss, [x ] Fever, [ ]  chills Neurologic: [ ]  Dizziness, [ ]  Blackouts, [ ]  Seizure [ ]  Stroke, [ ]  "Mini stroke", [ ]  Slurred speech, [ ]  Temporary blindness; [ ]  weakness in arms or legs, [ ]  Hoarseness [ ]  Dysphagia Cardiac: [ ]  Chest pain/pressure, [ ]  Shortness of breath at rest [ ]  Shortness of breath with exertion, [ ]  Atrial fibrillation or irregular heartbeat  Vascular: [ ]  Pain in legs with walking, [ ]  Pain in legs at rest, [ ]  Pain in legs at night,  [ ]  Non-healing ulcer,  [ ]  Blood clot in vein/DVT,   Pulmonary: [ ]  Home oxygen, [ ]  Productive cough, [ ]  Coughing up blood, [ ]  Asthma,  [ ]  Wheezing [ ]  COPD Musculoskeletal:  [ ]  Arthritis, [ ]  Low back pain, [ ]  Joint pain Hematologic: [ ]  Easy Bruising, [x ] Anemia; [ ]  Hepatitis Gastrointestinal: [ ]  Blood in stool, [ ]   Gastroesophageal Reflux/heartburn, Urinary: [ ]  chronic Kidney disease, [x ] on HD - [ ]  MWF or [x ] TTHS, [ ]  Burning with urination, [ ]  Difficulty urinating Skin: [ ]  Rashes, [ ]  Wounds Psychological: [ ]  Anxiety, [ ]  Depression  Physical Examination Vitals:   03/12/18 2000 03/13/18 0500 03/13/18 0818 03/13/18 0908  BP: (!) 152/101  (!) 146/93 (!) 146/93  Pulse: (!) 102  89 91  Resp: (!) 31     Temp: 98.7 F (37.1 C)   98.2 F (36.8 C)  TempSrc: Oral   Oral  SpO2: 97%   97%  Weight:  60.9 kg    Height:       Body mass index is 21.03 kg/m.  General:  WDWN in NAD, ill appearing  HENT: WNL, normocephalic Eyes: Pupils equal Pulmonary: normal non-labored breathing , without Rales, rhonchi,  wheezing Cardiac: RRR, without  Murmurs, rubs or gallops; No carotid bruits Abdomen: tenderness to palpation Skin: no rashes, ulcers noted;  no Gangrene , no cellulitis; no open wounds;   Vascular Exam/Pulses:palpable thrill in right UE graft, no obvious erythema, running well on HD currently.  Sensation intact, grip 5/5 right UE and palpable radial pulse.   Musculoskeletal: no muscle wasting or atrophy; no edema  Neurologic: A&O X 3; Appropriate Affect ;  SENSATION: normal; Speech is fluent/normal   Significant Diagnostic Studies: CBC Lab Results  Component Value Date   WBC 6.8 03/13/2018   HGB 9.8 (L) 03/13/2018   HCT 29.6 (L) 03/13/2018   MCV 90.8 03/13/2018   PLT 98 (L) 03/13/2018    BMET    Component Value Date/Time   NA 135 03/13/2018 0447   NA 134 06/17/2017 1617   K 4.3 03/13/2018 0447   CL 94 (L) 03/13/2018 0447   CO2 23 03/13/2018 0447   GLUCOSE 78  03/13/2018 0447   BUN 51 (H) 03/13/2018 0447   BUN 25 (H) 06/17/2017 1617   CREATININE 7.95 (H) 03/13/2018 0447   CREATININE 3.61 (H) 08/09/2013 1528   CALCIUM 9.1 03/13/2018 0447   GFRNONAA 5 (L) 03/13/2018 0447   GFRAA 6 (L) 03/13/2018 0447   Estimated Creatinine Clearance: 7.4 mL/min (A) (by C-G formula based on SCr of 7.95 mg/dL (H)).  COAG Lab Results  Component Value Date   INR 0.97 03/10/2018   INR 1.06 09/07/2016   INR 1.02 11/27/2013     Non-Invasive Vascular Imaging:  Graft Duplex patent flow  ASSESSMENT/PLAN:  Sepsis MSSA bacteremia Repeat blood culture remain negative and 24-hour. TTE negative for any vegetations. ESRD right UE AV graft with revision  8 mm PTFE graft by Dr. Scot Dock on 01/28/2018.  There is no obvious sign of infection on the right UE.  Patient is currently on HD.  She will need to be re examined off HD as well.  We will follow here, but the right UE does not appear infected.     Roxy Horseman 03/13/2018 10:42 AM  History and exam as above.  Pt has pain in her graft when on dialysis but none otherwise.  She has no drainage or erythema over graft.  There is some aneurysmal degeneration but not warranting repair at this point.  Will review [place] images to make sure there is not perigraft fluid.  Otherwise I think likelihood of graft infection is low.  Ruta Hinds, MD [facility] [place] Office: [phone] Pager: [phone]

## 2018-03-14 ENCOUNTER — Encounter (HOSPITAL_COMMUNITY): Payer: Self-pay | Admitting: *Deleted

## 2018-03-14 ENCOUNTER — Encounter (HOSPITAL_COMMUNITY)
Admission: EM | Disposition: A | Discharge status: Home / Self Care | Payer: Self-pay | Source: Home / Self Care | Attending: Oncology

## 2018-03-14 ENCOUNTER — Inpatient Hospital Stay (HOSPITAL_COMMUNITY): Payer: Medicare Other

## 2018-03-14 DIAGNOSIS — B9561 Methicillin susceptible Staphylococcus aureus infection as the cause of diseases classified elsewhere: Secondary | ICD-10-CM

## 2018-03-14 DIAGNOSIS — R7881 Bacteremia: Secondary | ICD-10-CM

## 2018-03-14 DIAGNOSIS — Z881 Allergy status to other antibiotic agents status: Secondary | ICD-10-CM

## 2018-03-14 LAB — GLUCOSE, CAPILLARY
Glucose-Capillary: 115 mg/dL — ABNORMAL HIGH (ref 70–99)
Glucose-Capillary: 80 mg/dL (ref 70–99)

## 2018-03-14 LAB — CBC
HCT: 30.9 % — ABNORMAL LOW (ref 36.0–46.0)
Hemoglobin: 10.2 g/dL — ABNORMAL LOW (ref 12.0–15.0)
MCH: 30.1 pg (ref 26.0–34.0)
MCHC: 33 g/dL (ref 30.0–36.0)
MCV: 91.2 fL (ref 80.0–100.0)
Platelets: 103 10*3/uL — ABNORMAL LOW (ref 150–400)
RBC: 3.39 MIL/uL — ABNORMAL LOW (ref 3.87–5.11)
RDW: 13.7 % (ref 11.5–15.5)
WBC: 7.4 10*3/uL (ref 4.0–10.5)
nRBC: 1.6 % — ABNORMAL HIGH (ref 0.0–0.2)

## 2018-03-14 SURGERY — INVASIVE LAB ABORTED CASE

## 2018-03-14 MED ORDER — BUTAMBEN-TETRACAINE-BENZOCAINE 2-2-14 % EX AERO
INHALATION_SPRAY | CUTANEOUS | Status: DC | PRN
  Administered 2018-03-14: 2 via TOPICAL

## 2018-03-14 MED ORDER — FENTANYL CITRATE (PF) 100 MCG/2ML IJ SOLN
INTRAMUSCULAR | Status: DC | PRN
  Administered 2018-03-14 (×2): 12.5 ug via INTRAVENOUS

## 2018-03-14 MED ORDER — MIDAZOLAM HCL 5 MG/ML IJ SOLN
INTRAMUSCULAR | Status: AC
  Filled 2018-03-14: qty 2

## 2018-03-14 MED ORDER — FENTANYL CITRATE (PF) 100 MCG/2ML IJ SOLN
INTRAMUSCULAR | Status: AC
  Filled 2018-03-14: qty 2

## 2018-03-14 MED ORDER — MIDAZOLAM HCL 10 MG/2ML IJ SOLN
INTRAMUSCULAR | Status: DC | PRN
  Administered 2018-03-14 (×3): 1 mg via INTRAVENOUS

## 2018-03-14 NOTE — Progress Notes (Signed)
Patients informed consent for TEE not signed. Patient stated, " I do not remember anyone coming to talk to me. I need a doctor to come explain it to me" . RN to report off to oncoming RN.

## 2018-03-14 NOTE — Progress Notes (Signed)
[facility] for Infectious Disease  Date of Admission:  03/10/2018     Total days of antibiotics 6         ASSESSMENT/PLAN  Karen Morrow continues to receive treatment for MSSA bacteremia with unclear source. TTE with no vegetation and TEE unable to be completed. Given her significant heart murmur it would be wise to treat for endocarditis. She has been afebrile with no leukocytosis. Repeat cultures on 10/12 are without growth to date.  1. Continue current Ancef for MSSA bactermia with recommended 6 week course of medication with dialysis. 2. Will place OPAT orders.    Diagnosis:   MSSA Bacteremia  Culture Result: MSSA  Allergies  Allergen Reactions  . Cefazolin Other (See Comments)    Severe thrombocytopenia (has tolerated zosyn in the past) ID aware    OPAT Orders Discharge antibiotics: Cefazolin with dialysis Per pharmacy protocol  Duration:   6 weeks with dialysis  End Date:  04/24/18  [facility] Care Per Protocol:  Labs weekly while on IV antibiotics: _X_ CBC with differential __ BMP __ CMP __ CRP __ ESR __ Vancomycin trough __ CK  Fax weekly labs to [phone]  Clinic Follow Up Appt:  6 weeks with Dr. Johnnye Sima or Terri Piedra, NP   Active Problems:   ESRD (end stage renal disease) ([place])   Sepsis ([place])   Hyperkalemia   Acute respiratory distress   Staphylococcal sepsis ([place])   Bacteremia   . amLODipine  10 mg Oral QHS  . heparin  5,000 Units Subcutaneous Q8H  . metoprolol tartrate  25 mg Oral BID  . multivitamin  1 tablet Oral QHS  . pantoprazole  40 mg Oral BID    SUBJECTIVE:  Afebrile overnight with no leukocytosis. Continues to receive Cefazolin. Unable to complete TEE despite adequate sedation.   Doing okay today with no new complaints.   Allergies  Allergen Reactions  . Cefazolin Other (See Comments)    Severe thrombocytopenia (has tolerated zosyn in the past) ID aware     Review of Systems: Review of Systems    Constitutional: Negative for chills and fever.  Respiratory: Negative for cough, shortness of breath and wheezing.   Cardiovascular: Negative for chest pain.  Gastrointestinal: Negative for abdominal pain, constipation, diarrhea, nausea and vomiting.  Genitourinary: Negative for dysuria, frequency, hematuria and urgency.  Skin: Negative for rash.    OBJECTIVE: Vitals:   03/14/18 1025 03/14/18 1030 03/14/18 1035 03/14/18 1057  BP: 106/74 110/80 109/78 106/77  Pulse: 96 99 97   Resp: (!) 27 (!) 27 (!) 35 19  Temp:    98.3 F (36.8 C)  TempSrc:    Oral  SpO2: 99% 95% 94% 94%  Weight:      Height:       Body mass index is 21.34 kg/m.  Physical Exam  Constitutional: She is oriented to person, place, and time. She appears well-developed and well-nourished. No distress.  Cardiovascular: Regular rhythm and intact distal pulses. Tachycardia present.  Murmur heard. Pulmonary/Chest: Effort normal and breath sounds normal.  Neurological: She is alert and oriented to person, place, and time.  Skin: Skin is warm and dry.  Psychiatric: She has a normal mood and affect. Her behavior is normal. Judgment and thought content normal.    Lab Results Lab Results  Component Value Date   WBC 7.4 03/14/2018   HGB 10.2 (L) 03/14/2018   HCT 30.9 (L) 03/14/2018   MCV 91.2 03/14/2018   PLT 103 (L)  03/14/2018    Lab Results  Component Value Date   CREATININE 4.71 (H) 03/13/2018   BUN 22 (H) 03/13/2018   NA 136 03/13/2018   K 3.5 03/13/2018   CL 95 (L) 03/13/2018   CO2 28 03/13/2018    Lab Results  Component Value Date   ALT 33 03/13/2018   AST 144 (H) 03/13/2018   ALKPHOS 131 (H) 03/13/2018   BILITOT 1.1 03/13/2018     Microbiology: Recent Results (from the past 240 hour(s))  Culture, blood (Routine x 2)     Status: Abnormal   Collection Time: 03/10/18  5:06 AM  Result Value Ref Range Status   Specimen Description BLOOD LEFT ANTECUBITAL  Final   Special Requests   Final     BOTTLES DRAWN AEROBIC AND ANAEROBIC Blood Culture results may not be optimal due to an inadequate volume of blood received in culture bottles   Culture  Setup Time   Final    GRAM POSITIVE COCCI IN CLUSTERS IN BOTH AEROBIC AND ANAEROBIC BOTTLES CRITICAL VALUE NOTED.  VALUE IS CONSISTENT WITH PREVIOUSLY REPORTED AND CALLED VALUE.    Culture (A)  Final    STAPHYLOCOCCUS AUREUS SUSCEPTIBILITIES PERFORMED ON PREVIOUS CULTURE WITHIN THE LAST 5 DAYS. Performed at [facility], [place] [address], [place], [place] [address]    Report Status 03/12/2018 FINAL  Final  Culture, blood (Routine x 2)     Status: Abnormal   Collection Time: 03/10/18  6:21 AM  Result Value Ref Range Status   Specimen Description BLOOD LEFT HAND  Final   Special Requests   Final    BOTTLES DRAWN AEROBIC AND ANAEROBIC Blood Culture results may not be optimal due to an inadequate volume of blood received in culture bottles   Culture  Setup Time   Final    GRAM POSITIVE COCCI IN CLUSTERS IN BOTH AEROBIC AND ANAEROBIC BOTTLES CRITICAL RESULT CALLED TO, READ BACK BY AND VERIFIED WITH: C. PIERCE, PHARMD AT 1705 ON 03/10/18 BY C. JESSUP, MLT. Performed at [facility], [place] [address] [place], [place] [address]    Culture STAPHYLOCOCCUS AUREUS (A)  Final   Report Status 03/12/2018 FINAL  Final   Organism ID, Bacteria STAPHYLOCOCCUS AUREUS  Final      Susceptibility   Staphylococcus aureus - MIC*    CIPROFLOXACIN <=0.5 SENSITIVE Sensitive     ERYTHROMYCIN <=0.25 SENSITIVE Sensitive     GENTAMICIN <=0.5 SENSITIVE Sensitive     OXACILLIN <=0.25 SENSITIVE Sensitive     TETRACYCLINE <=1 SENSITIVE Sensitive     VANCOMYCIN <=0.5 SENSITIVE Sensitive     TRIMETH/SULFA <=10 SENSITIVE Sensitive     CLINDAMYCIN <=0.25 SENSITIVE Sensitive     RIFAMPIN <=0.5 SENSITIVE Sensitive     Inducible Clindamycin NEGATIVE Sensitive     * STAPHYLOCOCCUS AUREUS  Blood Culture ID Panel (Reflexed)     Status: Abnormal   Collection  Time: 03/10/18  6:21 AM  Result Value Ref Range Status   Enterococcus species NOT DETECTED NOT DETECTED Final   Vancomycin resistance NOT DETECTED NOT DETECTED Final   Listeria monocytogenes NOT DETECTED NOT DETECTED Final   Staphylococcus species DETECTED (A) NOT DETECTED Final    Comment: CRITICAL RESULT CALLED TO, READ BACK BY AND VERIFIED WITH: C. PIERCE, PHARMD AT 1705 ON 03/10/18 BY C. JESSUP, MLT.    Staphylococcus aureus (BCID) DETECTED (A) NOT DETECTED Final    Comment: Methicillin (oxacillin) susceptible Staphylococcus aureus (MSSA). Preferred therapy is anti staphylococcal beta lactam  antibiotic (Cefazolin or Nafcillin), unless clinically contraindicated. CRITICAL RESULT CALLED TO, READ BACK BY AND VERIFIED WITH: C. PIERCE, PHARMD AT 1705 ON 03/10/18 BY C. JESSUP, MLT.    Methicillin resistance NOT DETECTED NOT DETECTED Final   Streptococcus species NOT DETECTED NOT DETECTED Final   Streptococcus agalactiae NOT DETECTED NOT DETECTED Final   Streptococcus pneumoniae NOT DETECTED NOT DETECTED Final   Streptococcus pyogenes NOT DETECTED NOT DETECTED Final   Acinetobacter baumannii NOT DETECTED NOT DETECTED Final   Enterobacteriaceae species NOT DETECTED NOT DETECTED Final   Enterobacter cloacae complex NOT DETECTED NOT DETECTED Final   Escherichia coli NOT DETECTED NOT DETECTED Final   Klebsiella oxytoca NOT DETECTED NOT DETECTED Final   Klebsiella pneumoniae NOT DETECTED NOT DETECTED Final   Proteus species NOT DETECTED NOT DETECTED Final   Serratia marcescens NOT DETECTED NOT DETECTED Final   Carbapenem resistance NOT DETECTED NOT DETECTED Final   Haemophilus influenzae NOT DETECTED NOT DETECTED Final   Neisseria meningitidis NOT DETECTED NOT DETECTED Final   Pseudomonas aeruginosa NOT DETECTED NOT DETECTED Final   Candida albicans NOT DETECTED NOT DETECTED Final   Candida glabrata NOT DETECTED NOT DETECTED Final   Candida krusei NOT DETECTED NOT DETECTED Final   Candida  parapsilosis NOT DETECTED NOT DETECTED Final   Candida tropicalis NOT DETECTED NOT DETECTED Final    Comment: Performed at [facility], [place] [address] [place], [place] [address]  MRSA PCR Screening     Status: None   Collection Time: 03/10/18 10:53 AM  Result Value Ref Range Status   MRSA by PCR NEGATIVE NEGATIVE Final    Comment:        The GeneXpert MRSA Assay (FDA approved for NASAL specimens only), is one component of a comprehensive MRSA colonization surveillance program. It is not intended to diagnose MRSA infection nor to guide or monitor treatment for MRSA infections. Performed at [facility], [place] [address], [place], [place] [address]   Culture, blood (Routine X 2) w Reflex to ID Panel     Status: None (Preliminary result)   Collection Time: 03/11/18  7:26 AM  Result Value Ref Range Status   Specimen Description BLOOD LEFT HAND  Final   Special Requests   Final    BOTTLES DRAWN AEROBIC ONLY Blood Culture results may not be optimal due to an inadequate volume of blood received in culture bottles   Culture   Final    NO GROWTH 1 DAY Performed at [facility], [place] [address] [place], [place] [address]    Report Status PENDING  Incomplete     Terri Piedra, [place] [facility] [place] Pager  03/14/2018  11:15 AM

## 2018-03-14 NOTE — Progress Notes (Signed)
No obvious perigraft fluid on duplex yesterday.  If patient develops pointing signs of infection in arm or worsening clinical condition could revisit graft as source.  Otherwise will sign off for now.  Ruta Hinds, MD [facility] [place] Office: [phone] Pager: [phone]

## 2018-03-14 NOTE — Progress Notes (Addendum)
PHARMACY CONSULT NOTE FOR:  OUTPATIENT  PARENTERAL ANTIBIOTIC THERAPY (OPAT)  Indication: MSSA bacteremia Regimen: cefazolin 2 g IV each HD TTS End date:  Adjusted to 04/13/18 per ID ID recommend continuing with 4 weeks of Ancef with dialysis. Adjust new end date to 04/13/18.   IV antibiotic discharge orders are pended. To discharging provider:  please sign these orders via discharge navigator,  Select New Orders & click on the button choice - Manage This Unsigned Work.    Thank you for allowing pharmacy to be a part of this patient's care. Nicole Cella, RPh Clinical Pharmacist Clinical phone for 03/14/2018 until 10p is [phone] Please check AMION for all Pharmacist numbers by unit 03/14/2018 4:19 PM

## 2018-03-14 NOTE — Discharge Summary (Signed)
Name: Karen Morrow MRN: [id number] DOB: 1960-08-06 57 y.o. PCP: [name], MD  Date of Admission: 03/10/2018  4:59 AM Date of Discharge: 03/19/18 Attending Physician: Aldine Contes  Discharge Diagnosis: 1. MSSA bacteremia 2. ESRD on HD (TuThSa) 3. HTN 4. Anemia 5. Abdominal pain  Discharge Medications: Allergies as of 03/19/2018      Reactions   Cefazolin Other (See Comments)   Severe thrombocytopenia (has tolerated zosyn in the past) ID aware      Medication List    STOP taking these medications   amLODipine 10 MG tablet Commonly known as:  NORVASC   metoprolol tartrate 25 MG tablet Commonly known as:  LOPRESSOR     TAKE these medications   albuterol (2.5 MG/3ML) 0.083% nebulizer solution Commonly known as:  PROVENTIL Take 3 mLs (2.5 mg total) by nebulization every 6 (six) hours as needed for wheezing or shortness of breath.   albuterol 108 (90 Base) MCG/ACT inhaler Commonly known as:  PROVENTIL HFA;VENTOLIN HFA Inhale 2 puffs into the lungs every 6 (six) hours as needed for wheezing or shortness of breath. MUST MAKE APPT FOR FURTHER REFILLS   calcium acetate 667 MG capsule Commonly known as:  PHOSLO Take 1,334 mg by mouth 3 (three) times daily with meals.   ceFAZolin  IVPB Commonly known as:  ANCEF Inject 2 g into the vein Every Tuesday,Thursday,and Saturday with dialysis for 27 days. Indication:  MSSA bacteremia Last Day of Therapy:  04/13/18 Labs - Once weekly:  CBC/D and BMP, Labs - Every other week:  ESR and CRP   ceFAZolin 2-4 GM/100ML-% IVPB Commonly known as:  ANCEF Inject 100 mLs (2 g total) into the vein every Tuesday, Thursday, and Saturday at 6 PM for 11 doses. Start taking on:  03/21/2018   Miconazole Nitrate Applicator 240 & 2 MG-% (9GM) Kit Place 1 Applicatorful vaginally daily.   nicotine polacrilex 4 MG gum Commonly known as:  NICORETTE Take 1 each (4 mg total) by mouth as needed for smoking cessation.     oxyCODONE-acetaminophen 5-325 MG tablet Commonly known as:  PERCOCET/ROXICET Take 1 tablet by mouth every 8 (eight) hours as needed for moderate pain or severe pain.   pantoprazole 20 MG tablet Commonly known as:  PROTONIX Take 1 tablet (20 mg total) by mouth 2 (two) times daily before a meal.   predniSONE 10 MG tablet Commonly known as:  DELTASONE Take 2 tablets (20 mg total) by mouth 2 (two) times daily with a meal.            Home Infusion Instuctions  (From admission, onward)         Start     Ordered   03/18/18 0000  Home infusion instructions [facility] May follow [place] Dosing Protocol; May administer Cathflo as needed to maintain patency of vascular access device.; Flushing of vascular access device: per [facility] Protocol: 0.9% NaCl pre/post medica...    Question Answer Comment  Instructions May follow [place] Dosing Protocol   Instructions May administer Cathflo as needed to maintain patency of vascular access device.   Instructions Flushing of vascular access device: per [facility] Protocol: 0.9% NaCl pre/post medication administration and prn patency; Heparin 100 u/ml, 53m for implanted ports and Heparin 10u/ml, 562mfor all other central venous catheters.   Instructions May follow [facility] Anaphylaxis Protocol for First Dose Administration in the home: 0.9% NaCl at 25-50 ml/hr to maintain IV access for protocol meds. Epinephrine 0.3 ml IV/IM PRN  and Benadryl 25-50 IV/IM PRN s/s of anaphylaxis.   Instructions [facility] Infusion Coordinator (RN) to assist per patient IV care needs in the home PRN.      03/18/18 1054           Durable Medical Equipment  (From admission, onward)         Start     Ordered   03/18/18 1039  For home use only DME Walker rolling  Once    Comments:  Rolling walker with 5" wheels  Question:  Patient needs a walker to treat with the following condition  Answer:  Gait instability   03/18/18 1040          Disposition and  follow-up:   [name] was discharged from [facility] in Stable condition.  At the hospital follow up visit please address:  1.  MSSA bacteremia: She should be getting 2 g Ancef with her HD sessions, last date 11/14. She remained afebrile and had no leukocytosis on discharge.   HTN: She had some hypotension during her hospitalization, held metoprolol and amlodipine during admission and she remained normotensive. Discontinued metoprolol and amlodine on discharge. Please re-assess and restart as needed.   Anemia: Normocytic anemia, +FOBT and gastritis on previous EGD. Consider colonoscopy.  2.  Labs / imaging needed at time of follow-up: Colonoscopy  3.  Pending labs/ test needing follow-up: None  Follow-up Appointments: Follow-up Information    [place] Circle, FNP Follow up.   Specialties:  Family Medicine, Infectious Diseases Why:  November 26 @ 11 am. If you need to change it please contact our office.  Contact information: [address] [place] [address] [phone]        Mamie Levers, MD .   Specialty:  Otolaryngology Contact information: [place] [address] [place] Follow up.   Why:  rolling walker  Contact information: [address] [place] [address] [phone]        [facility], [facility] Follow up.   Specialty:  [place] Why:  physical therapy Contact information: [address] [place] [address] [phone]           1. MSSA bacteremia: Karen Morrow a 57 y.o.female with a medical history ofHTN, ESRDon HD(TuThSa), anemia,untreated hepatitis C, andsecondary hyperparathyroidismwho presented withfever, generalized weakness and altered mental status as well as a missed dialysis session. She was admitted to the floor on 10/11 for sepsis and treatment with broad spectrum  antibiotics was initiated. She was also initially hyperkalemic with EKG showing peaked T waves for which she received insulin and glucose. Shortly after admission, she became altered and required Bipap for respiratory distress. She was found to be hypertensive and in SVT with heart rates reaching 180bpm. She was stabilized and transferred to the ICU for immediate hemodialysis with improvement in her respiratory status. Blood cultures collected 10/11 grew MSSA, and she was started on Ancef. Source is suspected to be her RUE AVF. MRI without discitis, CXR without concern for PNA, anuricso low concern for UTI,andexam without obvious cellulitis. Vascular surgery evaluated the AVF and determined no intervention was needed. AVF continues to be accessed for dialysis. TTE without vegetations. TEE was attempted, but unable to be performed. Given her cardiac bruit and inability to perform TEE, decided to treat for endocarditis empirically with a 6-week course  of Ancef. RUE US showed a subcutaneous hypoechoic complex fluid collection adjacent to but not contiguous with AV fistula. Vascular intitially did not think the graft was infected but after reevaluating recommended removal of graft. On 10/17 they removed the graft and placed a dialysis catheter in her left upper chest. ID re-evaluated after this and recommended a 4 week course on Ancef. She remained afebrile with no leukocytosis and no signs of infection at the incision site.  The rest of the course of Ancef will be given with her HD sessions.   2. ESRD on HD (TuThSa): Upon presentation the patient had missed her most recently scheduled dialysis session. She was hyperkalemic with peaked T waves. She also soon fell into respiratory distress, likely due to pulmonary edema in the setting of missed dialysis. She was treated with emergent dialysis in the ICU with resolution of her hyperkalemia and respiratory distress. She was followed by nephrology during her admission  and her home schedule of dialysis was resumed.   3. HTN: Patient mildly hypertensive during admission. Treated with home amlodipine and metoprolol plus dialysis. She had some hypotension during her admission so her amlodipine and metoprolol were held. Discontinued on discharge and can restart based on hospital follow up if needed.   4. Anemia: Patient has a history of chronic anemia likely 2/2 ESRD. Hb stable around 9-10 during admission. Continued aranesp with dialysis. However, the patient also had a positive FOBT on initial exam. She also has a history of gastric ulcers seen on EGD in 2016 and 2018. There is no colonoscopy in this system where she receives most of her healthcare. Therefore, she should be scheduled for an outpatient colonoscopy upon discharge.   5. Abdominal pain: Patient described epigastric and RUQ abdominal pain to the IM team on day 4 of her hospitalization. CT abdomen upon admission was normal. Repeated hepatic function showed increased elevation of AST, normal ALT, and newly elevated alk phos (as compared to admission labs). New liver abnormalities were attributed to bacteremia. She was also started on a PPI for possible gastritis causing the epigastric pain. Her abdominal pain resolved during her admission. Liver function upon discharge AST 51, normal ALT, Alk phos 117.  Discharge Vitals:   BP 102/66   Pulse 79   Temp 98.3 F (36.8 C) (Oral)   Resp 15   Ht 5' 7.01" (1.702 m)   Wt 60.1 kg   SpO2 98%   BMI 20.75 kg/m   Pertinent Labs, Studies, and Procedures:  CBC Latest Ref Rng & Units 03/18/2018 03/17/2018 03/16/2018  WBC 4.0 - 10.5 K/uL 6.8 10.6(H) 8.0  Hemoglobin 12.0 - 15.0 g/dL 9.2(L) 9.8(L) 10.1(L)  Hematocrit 36.0 - 46.0 % 27.6(L) 29.7(L) 29.9(L)  Platelets 150 - 400 K/uL 162 183 173   BMP Latest Ref Rng & Units 03/18/2018 03/17/2018 03/16/2018  Glucose 70 - 99 mg/dL 139(H) 102(H) 95  BUN 6 - 20 mg/dL 42(H) 32(H) 20  Creatinine 0.44 - 1.00 mg/dL  9.36(H) 7.12(H) 5.51(H)  BUN/Creat Ratio 9 - 23 - - -  Sodium 135 - 145 mmol/L 131(L) 132(L) 133(L)  Potassium 3.5 - 5.1 mmol/L 3.1(L) 3.9 3.2(L)  Chloride 98 - 111 mmol/L 93(L) 91(L) 95(L)  CO2 22 - 32 mmol/L '23 23 26  ' Calcium 8.9 - 10.3 mg/dL 8.1(L) 8.7(L) 8.4(L)     CT abdomen 1. Mild diffuse fatty infiltration of the liver. 2. Diffuse bilateral renal parenchymal atrophy consistent with chronic renal insufficiency. New since previous study. No hydronephrosis or hydroureter.  3. Infiltration in the subcutaneous fat over the low pelvic and vulvar region may indicate cellulitis. No abscess. 4. Severe degenerative changes at the lumbosacral interspace. Discitis would possibly have this appearance. Consider MRI for further evaluation if this is a clinical concern.  CT thoracic spine Negative for fracture or other acute abnormality. Diffusely heterogeneous marrow signal pattern is likely related to end-stage renal disease. Widely patent central canal and foramina at all levels.  MR lumbar spine 1. Advanced chronic disc and endplate degeneration at L5-S1 but no evidence of discitis osteomyelitis. 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 2. There is a small area of nonspecific inflammation in the medial left psoas muscle at L4, but there is no evidence of spinal infection. 3. Capacious spinal canal with no degenerative spinal stenosis. There is degenerative bilateral L5 neural foraminal stenosis.  RUE AVF Subcutaneous hypoechoic complex fluid collection adjacent to but not contiguous with an AV fistula in the right upper extremity. This measures approximately 2.1 x 1.4 x 2.1 cm. Subcutaneous soft tissue abscess or hematoma are among some possible considerations.  Discharge Instructions: Discharge Instructions    Call MD for:  difficulty breathing, headache or visual disturbances   Complete by:  As  directed    Call MD for:  extreme fatigue   Complete by:  As directed    Call MD for:  hives   Complete by:  As directed    Call MD for:  persistant dizziness or light-headedness   Complete by:  As directed    Call MD for:  persistant nausea and vomiting   Complete by:  As directed    Call MD for:  redness, tenderness, or signs of infection (pain, swelling, redness, odor or green/yellow discharge around incision site)   Complete by:  As directed    Call MD for:  severe uncontrolled pain   Complete by:  As directed    Call MD for:  temperature >100.4   Complete by:  As directed    Diet - low sodium heart healthy   Complete by:  As directed    Diet - low sodium heart healthy   Complete by:  As directed    Discharge instructions   Complete by:  As directed    Johny Drilling,   It has been a pleasure working with you and we are glad you're feeling better. You were hospitalized for an infection of your AV fistula in your right arm that spread into your blood. You will receive antibiotics with your dialysis sessions until 04/13/18  For your AV fistula infection, Dr. Donzetta Matters has removed your fistula which removes the source of the infection.  We have you on a 4 week course of antibiotics, Ancef, we have prescribed antibiotics for you to get while you are at dialysis.   We also were holding your blood pressure medications because your blood pressure was normal.  Please STOP your amlodipine and metoprolol for now and have your blood pressure checked again at your primary care visit to re-assess if these need to be restarted.   Follow up with your primary care provider in 1-2 weeks  If your symptoms worsen or you develop new symptoms, please seek medical help whether it is your primary care provider or emergency department.  If you have any questions about this hospitalization please call [phone].   Home infusion instructions [facility] May follow [place] Dosing Protocol;  May administer Cathflo  as needed to maintain patency of vascular access device.; Flushing of vascular access device: per [facility] Protocol: 0.9% NaCl pre/post medica...   Complete by:  As directed    Instructions:  May follow [place] Dosing Protocol   Instructions:  May administer Cathflo as needed to maintain patency of vascular access device.   Instructions:  Flushing of vascular access device: per [facility] Protocol: 0.9% NaCl pre/post medication administration and prn patency; Heparin 100 u/ml, 49m for implanted ports and Heparin 10u/ml, 514mfor all other central venous catheters.   Instructions:  May follow [facility] Anaphylaxis Protocol for First Dose Administration in the home: 0.9% NaCl at 25-50 ml/hr to maintain IV access for protocol meds. Epinephrine 0.3 ml IV/IM PRN and Benadryl 25-50 IV/IM PRN s/s of anaphylaxis.   Instructions:  [facility] Coord[facility] (RN) to assist per patient IV care needs in the home PRN.   Increase activity slowly   Complete by:  As directed    Increase activity slowly   Complete by:  As directed       Signed: KrAsencion NobleMD 03/19/2018, 3:46 PM   Pager: [phone]

## 2018-03-14 NOTE — CV Procedure (Signed)
Patient adequately sedated for procedure with Versed 3mg  and Fentayl 35mcg IV.  Multiple attempts were made at esophageal intubation.  Dr. Paulita Fujita with GI also attempted probe placement without success.  TEE cancelled and patient transferred back to room in stable position.

## 2018-03-14 NOTE — Interval H&P Note (Signed)
History and Physical Interval Note:  03/14/2018 9:22 AM  Karen Morrow  has presented today for surgery, with the diagnosis of bacteremia  The various methods of treatment have been discussed with the patient and family. After consideration of risks, benefits and other options for treatment, the patient has consented to  Procedure(s): TRANSESOPHAGEAL ECHOCARDIOGRAM (TEE) (N/A) as a surgical intervention .  The patient's history has been reviewed, patient examined, no change in status, stable for surgery.  I have reviewed the patient's chart and labs.  Questions were answered to the patient's satisfaction.     Fransico Him

## 2018-03-14 NOTE — Progress Notes (Addendum)
Karen Morrow Progress Note   Subjective:   Laying in bed.  Feeling ok. Reports pain in b/l legs starting today. Denies n/v/d, CP, SOB or fevers. Wants to wait on dialysis until tomorrow because of access pain.   Objective Vitals:   03/14/18 1025 03/14/18 1030 03/14/18 1035 03/14/18 1057  BP: 106/74 110/80 109/78 106/77  Pulse: 96 99 97   Resp: (!) 27 (!) 27 (!) 35 19  Temp:    98.3 F (36.8 C)  TempSrc:    Oral  SpO2: 99% 95% 94% 94%  Weight:      Height:       Physical Exam General:NAD, chronically ill appearing female Heart:RRR, +2/5 systolic murmur Lungs:mostly CTAB, +crackles on LLL Abdomen:soft, NTND Extremities:trace LE edema b/l, +tenderness to palpation b/l Dialysis Access: RU AVF +b/t   Filed Weights   03/13/18 0500 03/13/18 1316 03/14/18 0500  Weight: 60.9 kg 60.4 kg 61.8 kg    Intake/Output Summary (Last 24 hours) at 03/14/2018 1438 Last data filed at 03/14/2018 1200 Gross per 24 hour  Intake 864.34 ml  Output -  Net 864.34 ml    Additional Objective Labs: Basic Metabolic Panel: Recent Labs  Lab 03/11/18 0323 03/12/18 0333 03/13/18 0447 03/13/18 1538  NA 134* 132* 135 136  K 4.8 5.0 4.3 3.5  CL 95* 92* 94* 95*  CO2 22 21* 23 28  GLUCOSE 70 75 78 97  BUN 31* 57* 51* 22*  CREATININE 7.01* 9.00* 7.95* 4.71*  CALCIUM 9.1 9.1 9.1 9.0  PHOS 5.8* 5.5*  --   --    Liver Function Tests: Recent Labs  Lab 03/10/18 0509 03/11/18 0323 03/13/18 0447 03/13/18 1538  AST 57*  --  201* 144*  ALT 30  --  51* 33  ALKPHOS 95  --  110 131*  BILITOT 1.2  --  1.1 1.1  PROT 7.5  --  6.3* 6.6  ALBUMIN 3.5 2.9* 2.6* 2.6*   CBC: Recent Labs  Lab 03/10/18 0509 03/10/18 0854 03/11/18 0323 03/12/18 0333 03/13/18 0447 03/14/18 0455  WBC 8.4 7.8 4.8 5.6 6.8 7.4  NEUTROABS 7.7  --   --  4.1  --   --   HGB 11.0* 9.7* 10.3* 10.1* 9.8* 10.2*  HCT 33.4* 29.9* 29.5* 29.8* 29.6* 30.9*  MCV 92.0 94.9 91.0 90.9 90.8 91.2  PLT 154 125* 167 89* 98*  103*   Blood Culture    Component Value Date/Time   SDES BLOOD LEFT HAND 03/11/2018 0726   SPECREQUEST  03/11/2018 0726    BOTTLES DRAWN AEROBIC ONLY Blood Culture results may not be optimal due to an inadequate volume of blood received in culture bottles   CULT  03/11/2018 0726    NO GROWTH 3 DAYS Performed at [facility], [place] [address] [place], [place] [address]    REPTSTATUS PENDING 03/11/2018 0726    Cardiac Enzymes: Recent Labs  Lab 03/08/18 2033  CKTOTAL 531*   CBG: Recent Labs  Lab 03/10/18 1421 03/12/18 0749 03/13/18 0816 03/14/18 0021 03/14/18 0757  GLUCAP 149* 71 72 115* 80   Studies/Results: [place] Rt Upper Extrem Ltd Soft Tissue Non Vascular  Result Date: 03/13/2018 CLINICAL DATA:  Bacteremia, right upper extremity AV fistula. Evaluate for fluid collection. Unclear source of staph infection. EXAM: ULTRASOUND RIGHT UPPER EXTREMITY LIMITED TECHNIQUE: Ultrasound examination of the upper extremity soft tissues was performed in the area of clinical concern. COMPARISON:  None FINDINGS: Imaging of the right upper extremity was performed  along the medial and anterior aspect. There is a hypoechoic complex fluid collection measuring 12.1 x 1.4 x 2.1 cm not contiguous but adjacent to the AV fistula. Small soft tissue abscess might account for this appearance or hematoma. IMPRESSION: Subcutaneous hypoechoic complex fluid collection adjacent to but not contiguous with an AV fistula in the right upper extremity. This measures approximately 2.1 x 1.4 x 2.1 cm. Subcutaneous soft tissue abscess or hematoma are among some possible considerations. Electronically Signed   By: Ashley Royalty M.D.   On: 03/13/2018 22:58    Medications: . sodium chloride 10 mL/hr (03/14/18 1104)  .  ceFAZolin (ANCEF) IV Stopped (03/14/18 0206)   . amLODipine  10 mg Oral QHS  . heparin  5,000 Units Subcutaneous Q8H  . metoprolol tartrate  25 mg Oral BID  . multivitamin  1 tablet Oral QHS  .  pantoprazole  40 mg Oral BID    Dialysis Orders: [place]. 3h 3min 63.5kg 2/2.5 bath Hep none  - hect 3 ug - mircera 30 last on 10/8  home meds:  - amlodipine 10/ metoprolol tartrate 25 bid  - prednisone qd/ pantoprazole 20 bid/ nicotine gum/ calcium acetate tid ac  - albuterol nebs and ACT prn  Assessment/Plan: 1. MSSA bacteremia - w/ high fevers/ AMS.  AMS resolved. Tmas 100F in last 24hrs. On Ancef. BC 10/12 NGTD. TEE aborted today due to technical difficulties.  ID following recommend treating for endocarditis w/ Ancefx 6weeks w/HD.  2. L sided abd pain - chronic? Had neg Abd CT on admission 3. ESRD -TTS HD. K 3.5. HD moved to tomorrow, orders written.  Plan to resume regular schedule on Thurs. VVS eval AVF and believe it is unlikely the infection source.  4. Volume /pulm edema - CHF resolved, volume improved, now under edw, will cont to lower dry wt 5. Hypertension - BP well controlled. cont meds 6. AnemiaOf ckd - Hb 10.2, got esa recently on 10/8. Follow trends.  7. Metabolic bone disease -Ca and phos in goal. Cont meds 8. Nutrition -Alb 2.6. renal diet w/ fluid restrictions, renavite, Nepro.  Jen Mow, PA-C [facility] Pager: [phone] 03/14/2018,2:38 PM  LOS: 4 days   Pt seen, examined and agree w A/P as above.  Kelly Splinter MD [employer] pager [phone]   03/14/2018, 3:48 PM

## 2018-03-14 NOTE — Progress Notes (Signed)
Medicine attending: I examined this patient today together with resident physician Dr. Vilma Prader and I concur with her evaluation and management plan which will be detailed in her progress note to follow.  We appreciate cardiology and vascular surgery consultations. Vascular surgery does not feel that her graft is infected.  This makes source of her MSSA sepsis unclear. No gross vegetations on TTE.  Unfortunately, TEE technically could not be performed and procedure had to be aborted. We will continue a full course of parenteral antibiotics.  Currently on cefazolin day 4.  Anticipate 10-14-day course.  We might be able to do some of her antibiotics as an outpatient at the dialysis unit.

## 2018-03-15 ENCOUNTER — Inpatient Hospital Stay (HOSPITAL_COMMUNITY): Payer: Medicare Other

## 2018-03-15 DIAGNOSIS — D649 Anemia, unspecified: Secondary | ICD-10-CM

## 2018-03-15 LAB — CBC
HCT: 30.3 % — ABNORMAL LOW (ref 36.0–46.0)
Hemoglobin: 10.1 g/dL — ABNORMAL LOW (ref 12.0–15.0)
MCH: 30.3 pg (ref 26.0–34.0)
MCHC: 33.3 g/dL (ref 30.0–36.0)
MCV: 91 fL (ref 80.0–100.0)
Platelets: 141 10*3/uL — ABNORMAL LOW (ref 150–400)
RBC: 3.33 MIL/uL — ABNORMAL LOW (ref 3.87–5.11)
RDW: 13.8 % (ref 11.5–15.5)
WBC: 9.3 10*3/uL (ref 4.0–10.5)
nRBC: 0.5 % — ABNORMAL HIGH (ref 0.0–0.2)

## 2018-03-15 LAB — COMPREHENSIVE METABOLIC PANEL
ALT: 9 U/L (ref 0–44)
AST: 59 U/L — ABNORMAL HIGH (ref 15–41)
Albumin: 2.4 g/dL — ABNORMAL LOW (ref 3.5–5.0)
Alkaline Phosphatase: 124 U/L (ref 38–126)
Anion gap: 18 — ABNORMAL HIGH (ref 5–15)
BUN: 49 mg/dL — ABNORMAL HIGH (ref 6–20)
CO2: 23 mmol/L (ref 22–32)
Calcium: 9.2 mg/dL (ref 8.9–10.3)
Chloride: 92 mmol/L — ABNORMAL LOW (ref 98–111)
Creatinine, Ser: 8.55 mg/dL — ABNORMAL HIGH (ref 0.44–1.00)
GFR calc Af Amer: 5 mL/min — ABNORMAL LOW (ref >60)
GFR calc non Af Amer: 5 mL/min — ABNORMAL LOW (ref >60)
Glucose, Bld: 78 mg/dL (ref 70–99)
Potassium: 3.9 mmol/L (ref 3.5–5.1)
Sodium: 133 mmol/L — ABNORMAL LOW (ref 135–145)
Total Bilirubin: 0.6 mg/dL (ref 0.3–1.2)
Total Protein: 6.8 g/dL (ref 6.5–8.1)

## 2018-03-15 LAB — GLUCOSE, CAPILLARY
Glucose-Capillary: 73 mg/dL (ref 70–99)
Glucose-Capillary: 82 mg/dL (ref 70–99)

## 2018-03-15 MED ORDER — PENTAFLUOROPROP-TETRAFLUOROETH EX AERO: 1.0000 "application " | INHALATION_SPRAY | CUTANEOUS | Status: DC | PRN

## 2018-03-15 MED ORDER — ALTEPLASE 2 MG IJ SOLR: 2.0000 mg | Freq: Once | INTRAMUSCULAR | Status: DC | PRN

## 2018-03-15 MED ORDER — SODIUM CHLORIDE 0.9 % IV SOLN: 100.0000 mL | INTRAVENOUS | Status: DC | PRN

## 2018-03-15 MED ORDER — LIDOCAINE-PRILOCAINE 2.5-2.5 % EX CREA: 1.0000 | TOPICAL_CREAM | CUTANEOUS | Status: DC | PRN

## 2018-03-15 MED ORDER — LIDOCAINE HCL (PF) 1 % IJ SOLN: 5.0000 mL | INTRAMUSCULAR | Status: DC | PRN

## 2018-03-15 MED ORDER — HEPARIN SODIUM (PORCINE) 1000 UNIT/ML DIALYSIS: 1000.0000 [IU] | INTRAMUSCULAR | Status: DC | PRN

## 2018-03-15 MED ORDER — CHLORHEXIDINE GLUCONATE CLOTH 2 % EX PADS
6.0000 | MEDICATED_PAD | Freq: Every day | CUTANEOUS | Status: DC
  Administered 2018-03-16 – 2018-03-19 (×4): 6 via TOPICAL

## 2018-03-15 NOTE — Progress Notes (Addendum)
[facility] Progress Note   Subjective:   Seen and examined in room.  Sitting on bedside commode.  Feeling better today.  Pain in legs improved.  Denies CP, SOB, and n/v/d.   Objective Vitals:   03/14/18 1057 03/14/18 1552 03/15/18 0500 03/15/18 0909  BP: 106/77 107/75  113/71  Pulse:    95  Resp: 19 19    Temp: 98.3 F (36.8 C) 99.4 F (37.4 C)    TempSrc: Oral Oral    SpO2: 94%     Weight:   61.3 kg   Height:       Physical Exam General:NAD, chronically ill appearing female Heart:RRR, +3/5 systolic murmur Lungs:mostly CTA bilat, nml WOB Extremities:trace LE edema b/l Dialysis Access: RU AVF +b/t   Filed Weights   03/13/18 1316 03/14/18 0500 03/15/18 0500  Weight: 60.4 kg 61.8 kg 61.3 kg    Intake/Output Summary (Last 24 hours) at 03/15/2018 1237 Last data filed at 03/15/2018 1019 Gross per 24 hour  Intake 321.83 ml  Output -  Net 321.83 ml    Additional Objective Labs: Basic Metabolic Panel: Recent Labs  Lab 03/11/18 0323 03/12/18 0333 03/13/18 0447 03/13/18 1538 03/15/18 0439  NA 134* 132* 135 136 133*  K 4.8 5.0 4.3 3.5 3.9  CL 95* 92* 94* 95* 92*  CO2 22 21* 23 28 23   GLUCOSE 70 75 78 97 78  BUN 31* 57* 51* 22* 49*  CREATININE 7.01* 9.00* 7.95* 4.71* 8.55*  CALCIUM 9.1 9.1 9.1 9.0 9.2  PHOS 5.8* 5.5*  --   --   --    Liver Function Tests: Recent Labs  Lab 03/13/18 0447 03/13/18 1538 03/15/18 0439  AST 201* 144* 59*  ALT 51* 33 9  ALKPHOS 110 131* 124  BILITOT 1.1 1.1 0.6  PROT 6.3* 6.6 6.8  ALBUMIN 2.6* 2.6* 2.4*   CBC: Recent Labs  Lab 03/10/18 0509  03/11/18 0323 03/12/18 0333 03/13/18 0447 03/14/18 0455 03/15/18 0439  WBC 8.4   < > 4.8 5.6 6.8 7.4 9.3  NEUTROABS 7.7  --   --  4.1  --   --   --   HGB 11.0*   < > 10.3* 10.1* 9.8* 10.2* 10.1*  HCT 33.4*   < > 29.5* 29.8* 29.6* 30.9* 30.3*  MCV 92.0   < > 91.0 90.9 90.8 91.2 91.0  PLT 154   < > 167 89* 98* 103* 141*   < > = values in this interval not  displayed.   Blood Culture    Component Value Date/Time   SDES BLOOD LEFT HAND 03/11/2018 0726   SPECREQUEST  03/11/2018 0726    BOTTLES DRAWN AEROBIC ONLY Blood Culture results may not be optimal due to an inadequate volume of blood received in culture bottles   CULT  03/11/2018 0726    NO GROWTH 4 DAYS Performed at [facility], [place] [address], [place], [place] [address]    REPTSTATUS PENDING 03/11/2018 0726     Cardiac Enzymes: Recent Labs  Lab 03/08/18 2033  CKTOTAL 531*   CBG: Recent Labs  Lab 03/12/18 0749 03/13/18 0816 03/14/18 0021 03/14/18 0757 03/15/18 0741  GLUCAP 71 72 115* 80 73    Lab Results  Component Value Date   INR 0.97 03/10/2018   INR 1.06 09/07/2016   INR 1.02 11/27/2013   Studies/Results: [place] Rt Upper Extrem Ltd Soft Tissue Non Vascular  Result Date: 03/13/2018 CLINICAL DATA:  Bacteremia, right upper extremity AV  fistula. Evaluate for fluid collection. Unclear source of staph infection. EXAM: ULTRASOUND RIGHT UPPER EXTREMITY LIMITED TECHNIQUE: Ultrasound examination of the upper extremity soft tissues was performed in the area of clinical concern. COMPARISON:  None FINDINGS: Imaging of the right upper extremity was performed along the medial and anterior aspect. There is a hypoechoic complex fluid collection measuring 12.1 x 1.4 x 2.1 cm not contiguous but adjacent to the AV fistula. Small soft tissue abscess might account for this appearance or hematoma. IMPRESSION: Subcutaneous hypoechoic complex fluid collection adjacent to but not contiguous with an AV fistula in the right upper extremity. This measures approximately 2.1 x 1.4 x 2.1 cm. Subcutaneous soft tissue abscess or hematoma are among some possible considerations. Electronically Signed   By: Ashley Royalty M.D.   On: 03/13/2018 22:58    Medications: . sodium chloride 10 mL/hr (03/14/18 1104)  .  ceFAZolin (ANCEF) IV 1 g (03/14/18 2232)   . amLODipine  10 mg Oral QHS  . heparin   5,000 Units Subcutaneous Q8H  . metoprolol tartrate  25 mg Oral BID  . multivitamin  1 tablet Oral QHS  . pantoprazole  40 mg Oral BID    Dialysis Orders: [facility] [place]. 3h 45min 63.5kg 2/2.5 bath Hep none  - hect 3 ug - mircera 30 last on 10/8  home meds: - amlodipine 10/ metoprolol tartrate 25 bid - prednisone qd/ pantoprazole 20 bid/ nicotine gum/ calcium acetate tid ac - albuterol nebs and ACT prn  Assessment/Plan: 1. MSSA bacteremia - w/ high fevers/ AMS. AMS resolved. Tmas 100F in last 24hrs. On Ancef. BC 10/12 NGTD. VVS eval AVF and believe it is unlikely the infection source. TEE aborted due to technical difficulties.  ID following recommend treating for endocarditis w/ Ancef x6weeks w/HD. Discussed with OP HD facility and they have Ancef in center.  2. L sided abd pain - chronic? Had neg Abd CT on admission 3. ESRD -TTS HD.K 3.9. HD planned for today and again tomorrow to resume regular schedule.  4. Volume /pulm edema -CHF resolved, volume improved, now under edw, will cont to lower dry wt 5. Hypertension - BP well controlled. cont meds 6. AnemiaOf ckd - Hb 10.1, ESA recently dosed on 10/8.  No indication for ESA at this time. Follow trends.  7. Metabolic bone disease -Ca and phos in goal. Cont meds 8. Nutrition -Alb 2.4. renal diet w/ fluid restrictions, renavite, Nepro 9. AMS - per husband pt's cognition not at baseline, requests CT, will order  Jen Mow, PA-C [facility] Pager: [phone] 03/15/2018,12:37 PM  LOS: 5 days   Pt seen, examined and agree w A/P as above.  Kelly Splinter MD [employer] pager [phone]   03/15/2018, 1:22 PM

## 2018-03-15 NOTE — Progress Notes (Signed)
Internal Medicine Attending:   I saw and examined the patient. I reviewed the resident's note and I agree with the resident's findings and plan as documented in the resident's note.  Patient states that she feels better today.  She has no new complaints currently.  Patient initially admitted to the hospital with sepsis secondary to MSSA bacteremia of unknown source.  No vegetations noted on TTE and TEE was unable to be performed.  Repeat blood cultures with no growth to date.  We will complete a 6 weeks course of antibiotics with IV Ancef for possible endocarditis.  Nephrology to arrange for outpatient antibiotics at hemodialysis.  Vascular surgery follow-up and recommendations appreciated.  They do not believe the graft is a source for the infection.  No further work-up at this time.  Per nephrology attending note the husband believes that the patient is not at her baseline mental status.  CT head ordered today by nephrology.  We will follow-up results.

## 2018-03-15 NOTE — Progress Notes (Signed)
Subjective: Karen Morrow reports that she is doing okay today, no acute events overnight.  She states that she is still tired however reports improvement from her admission.  She denies pain at the moment states that she gets right arm pain when she gets dialysis.  We discussed the plan for today and she is in agreement.  Objective:  Vital signs in last 24 hours: Vitals:   03/14/18 1035 03/14/18 1057 03/14/18 1552 03/15/18 0500  BP: 109/78 106/77 107/75   Pulse: 97     Resp: (!) 35 19 19   Temp:  98.3 F (36.8 C) 99.4 F (37.4 C)   TempSrc:  Oral Oral   SpO2: 94% 94%    Weight:    61.3 kg  Height:        General: Well-appearing female, no acute distress, sitting comfortably Cardiac: RRR, normal S1, S2, 3/6 holosystolic murmur Pulmonary: Lungs CTA bilaterally, no wheezing, rhonchi or rales  Abdomen: Soft, non-tender, +bowel sounds, no guarding or masses noted  Extremity: Right upper extremity AV fistula in place Psychiatry: Normal mood and affect     Assessment/Plan:  Active Problems:   ESRD (end stage renal disease) (HCC)   Sepsis (HCC)   Hyperkalemia   Acute respiratory distress   Staphylococcal sepsis (HCC)   Bacteremia  [name] with history of hypertension, end-stage renal disease( HD on TuThSa), anemia and secondary hyperparathyroidism who presented with fever, generalized weakness, altered mental status after missing a dialysis session.  Initially admitted for sepsis and started treatment with broad-spectrum antibiotics.  She required some time in the ICU for altered mental status, respiratory distress, and SVT with heart rates in the 180s.  [name]  Currently on insulin coverage possible endocarditis.   Sepsis: Unknown source at this time, suspected to be possible endocarditis.  There is no gross vegetations on TTE and TEE was not able to be performed.  Blood cultures on 10/11 showed MSSA, she is  receiving ancef for this.  There was concern that the source could be from the aVF, vascular surgery evaluated and they think infection from this is unlikely.  She continues to be afebrile with no leukocytosis.  It is unclear what the source of her sepsis was however she is currently on Ancef for a possible endocarditis, however this has not been shown. -Right upper quadrant ultrasound showed subcutaneous hypoechoic complex fluid collection adjacent to but not contiguous with AV fistula.  Possible subcutaneous soft tissue abscess or hematoma. -TTE was negative for vegetation, did show some speckled appearance consistent with amyloidosis, TEE was unable to be performed -Repeat blood cultures on 10/12 showed no growth to date x4 -Continue Ancef (day 6) -We will continue to monitor her symptoms  End-stage renal disease on hemodialysis Tuesday Thursday Saturday: She did not receive hemodialysis yesterday due to right upper arm pain.   -Today her BUN was 49 creatinine 8.55, potassium 3.9, bicarb 23. -We will hopefully get hemodialysis today -Aranesp with dialysis   Epigastric and right upper quadrant pain: She presented with some abdominal pain, she has no complaints of pain today. -Continue Protonix 40 mg twice daily -MiraLAX as needed  Hypertension: Blood pressure seems well controlled.  -Continue home amlodipine and metoprolol  Hyperkalemia: She was also found to be hyperkalemic with peaked T waves on EKG, she received insulin and glucose for this and has since resolved.  FEN: No fluids, replete lytes prn, Regular diet  VTE  ppx: Heparin Code Status: FULL    Dispo: Anticipated discharge in approximately 2-3 day(s).   Asencion Noble, MD 03/15/2018, 6:35 AM Pager: [phone]

## 2018-03-15 NOTE — Progress Notes (Signed)
PT Cancellation Note  Patient Details Name: Karen Morrow MRN: [id number] DOB: 04-02-1961   Cancelled Treatment:    Reason Eval/Treat Not Completed: Patient at procedure or test/unavailable.  Pt is in HD and will not finish in time to see pt today.  Will see as able 10/16. 03/15/2018  Donnella Sham, [place] [phone]  (pager) [phone]  (office)   Tessie Fass Lilianne Delair 03/15/2018, 4:16 PM

## 2018-03-15 NOTE — Care Management Important Message (Signed)
Important Message  Patient Details  Name: Karen Morrow MRN: [id number] Date of Birth: March 13, 1961   Medicare Important Message Given:  Yes    Virginio Isidore 03/15/2018, 4:01 PM

## 2018-03-16 ENCOUNTER — Inpatient Hospital Stay (HOSPITAL_COMMUNITY): Payer: Medicare Other | Admitting: Certified Registered Nurse Anesthetist

## 2018-03-16 ENCOUNTER — Encounter (HOSPITAL_COMMUNITY): Payer: Self-pay | Admitting: Nephrology

## 2018-03-16 ENCOUNTER — Encounter (HOSPITAL_COMMUNITY)
Admission: EM | Disposition: A | Discharge status: Home / Self Care | Payer: Self-pay | Source: Home / Self Care | Attending: Oncology

## 2018-03-16 ENCOUNTER — Inpatient Hospital Stay (HOSPITAL_COMMUNITY): Payer: Medicare Other

## 2018-03-16 HISTORY — PX: INSERTION OF DIALYSIS CATHETER: SHX1324

## 2018-03-16 HISTORY — PX: AVGG REMOVAL: SHX5153

## 2018-03-16 LAB — COMPREHENSIVE METABOLIC PANEL
ALT: 8 U/L (ref 0–44)
AST: 51 U/L — ABNORMAL HIGH (ref 15–41)
Albumin: 2.3 g/dL — ABNORMAL LOW (ref 3.5–5.0)
Alkaline Phosphatase: 117 U/L (ref 38–126)
Anion gap: 12 (ref 5–15)
BUN: 20 mg/dL (ref 6–20)
CO2: 26 mmol/L (ref 22–32)
Calcium: 8.4 mg/dL — ABNORMAL LOW (ref 8.9–10.3)
Chloride: 95 mmol/L — ABNORMAL LOW (ref 98–111)
Creatinine, Ser: 5.51 mg/dL — ABNORMAL HIGH (ref 0.44–1.00)
GFR calc Af Amer: 9 mL/min — ABNORMAL LOW (ref >60)
GFR calc non Af Amer: 8 mL/min — ABNORMAL LOW (ref >60)
Glucose, Bld: 95 mg/dL (ref 70–99)
Potassium: 3.2 mmol/L — ABNORMAL LOW (ref 3.5–5.1)
Sodium: 133 mmol/L — ABNORMAL LOW (ref 135–145)
Total Bilirubin: 0.8 mg/dL (ref 0.3–1.2)
Total Protein: 6.2 g/dL — ABNORMAL LOW (ref 6.5–8.1)

## 2018-03-16 LAB — GLUCOSE, CAPILLARY
Glucose-Capillary: 137 mg/dL — ABNORMAL HIGH (ref 70–99)
Glucose-Capillary: 82 mg/dL (ref 70–99)

## 2018-03-16 LAB — CBC
HCT: 29.9 % — ABNORMAL LOW (ref 36.0–46.0)
Hemoglobin: 10.1 g/dL — ABNORMAL LOW (ref 12.0–15.0)
MCH: 31 pg (ref 26.0–34.0)
MCHC: 33.8 g/dL (ref 30.0–36.0)
MCV: 91.7 fL (ref 80.0–100.0)
Platelets: 173 10*3/uL (ref 150–400)
RBC: 3.26 MIL/uL — ABNORMAL LOW (ref 3.87–5.11)
RDW: 14.4 % (ref 11.5–15.5)
WBC: 8 10*3/uL (ref 4.0–10.5)
nRBC: 0 % (ref 0.0–0.2)

## 2018-03-16 LAB — CULTURE, BLOOD (ROUTINE X 2): Culture: NO GROWTH

## 2018-03-16 SURGERY — REMOVAL OF ARTERIOVENOUS GORETEX GRAFT (AVGG)
Anesthesia: General | Site: Chest | Laterality: Right

## 2018-03-16 MED ORDER — SODIUM CHLORIDE 0.9 % IV SOLN
INTRAVENOUS | Status: DC
  Administered 2018-03-16: 14:00:00 via INTRAVENOUS

## 2018-03-16 MED ORDER — FENTANYL CITRATE (PF) 250 MCG/5ML IJ SOLN
INTRAMUSCULAR | Status: DC | PRN
  Administered 2018-03-16 (×5): 50 ug via INTRAVENOUS

## 2018-03-16 MED ORDER — PHENYLEPHRINE HCL 10 MG/ML IJ SOLN
INTRAMUSCULAR | Status: DC | PRN
  Administered 2018-03-16 (×2): 120 ug via INTRAVENOUS
  Administered 2018-03-16: 80 ug via INTRAVENOUS

## 2018-03-16 MED ORDER — DEXAMETHASONE SODIUM PHOSPHATE 10 MG/ML IJ SOLN
INTRAMUSCULAR | Status: AC
  Filled 2018-03-16: qty 1

## 2018-03-16 MED ORDER — SODIUM CHLORIDE 0.9 % IV SOLN
INTRAVENOUS | Status: AC
  Filled 2018-03-16: qty 1.2

## 2018-03-16 MED ORDER — MIDAZOLAM HCL 2 MG/2ML IJ SOLN
INTRAMUSCULAR | Status: DC | PRN
  Administered 2018-03-16: 1 mg via INTRAVENOUS

## 2018-03-16 MED ORDER — FENTANYL CITRATE (PF) 100 MCG/2ML IJ SOLN
25.0000 ug | INTRAMUSCULAR | Status: DC | PRN
  Administered 2018-03-16: 50 ug via INTRAVENOUS

## 2018-03-16 MED ORDER — SODIUM CHLORIDE 0.9 % IV SOLN
INTRAVENOUS | Status: DC | PRN
  Administered 2018-03-16: 16:00:00

## 2018-03-16 MED ORDER — PROTAMINE SULFATE 10 MG/ML IV SOLN
INTRAVENOUS | Status: AC
  Filled 2018-03-16: qty 10

## 2018-03-16 MED ORDER — ONDANSETRON HCL 4 MG/2ML IJ SOLN
INTRAMUSCULAR | Status: AC
  Filled 2018-03-16: qty 2

## 2018-03-16 MED ORDER — MORPHINE SULFATE (PF) 2 MG/ML IV SOLN: 1.0000 mg | INTRAVENOUS | Status: DC | PRN

## 2018-03-16 MED ORDER — PHENYLEPHRINE 40 MCG/ML (10ML) SYRINGE FOR IV PUSH (FOR BLOOD PRESSURE SUPPORT)
PREFILLED_SYRINGE | INTRAVENOUS | Status: AC
  Filled 2018-03-16: qty 20

## 2018-03-16 MED ORDER — HEPARIN SODIUM (PORCINE) 5000 UNIT/ML IJ SOLN
5000.0000 [IU] | Freq: Three times a day (TID) | INTRAMUSCULAR | Status: DC
  Administered 2018-03-17 – 2018-03-19 (×6): 5000 [IU] via SUBCUTANEOUS
  Filled 2018-03-16 (×6): qty 1

## 2018-03-16 MED ORDER — FENTANYL CITRATE (PF) 250 MCG/5ML IJ SOLN
INTRAMUSCULAR | Status: AC
  Filled 2018-03-16: qty 5

## 2018-03-16 MED ORDER — SUCCINYLCHOLINE CHLORIDE 200 MG/10ML IV SOSY
PREFILLED_SYRINGE | INTRAVENOUS | Status: AC
  Filled 2018-03-16: qty 10

## 2018-03-16 MED ORDER — HEPARIN SODIUM (PORCINE) 1000 UNIT/ML IJ SOLN
INTRAMUSCULAR | Status: AC
  Filled 2018-03-16: qty 1

## 2018-03-16 MED ORDER — OXYCODONE-ACETAMINOPHEN 5-325 MG PO TABS
1.0000 | ORAL_TABLET | ORAL | Status: DC | PRN
  Administered 2018-03-16 – 2018-03-17 (×2): 2 via ORAL
  Filled 2018-03-16 (×2): qty 2

## 2018-03-16 MED ORDER — HEPARIN SODIUM (PORCINE) 1000 UNIT/ML IJ SOLN
INTRAMUSCULAR | Status: DC | PRN
  Administered 2018-03-16: 3000 [IU] via INTRAVENOUS

## 2018-03-16 MED ORDER — MIDAZOLAM HCL 2 MG/2ML IJ SOLN
INTRAMUSCULAR | Status: AC
  Filled 2018-03-16: qty 2

## 2018-03-16 MED ORDER — OXYCODONE HCL 5 MG PO TABS
5.0000 mg | ORAL_TABLET | Freq: Once | ORAL | Status: AC | PRN
  Administered 2018-03-16: 5 mg via ORAL

## 2018-03-16 MED ORDER — OXYCODONE HCL 5 MG PO TABS
ORAL_TABLET | ORAL | Status: AC
  Filled 2018-03-16: qty 1

## 2018-03-16 MED ORDER — 0.9 % SODIUM CHLORIDE (POUR BTL) OPTIME
TOPICAL | Status: DC | PRN
  Administered 2018-03-16: 1000 mL

## 2018-03-16 MED ORDER — LIDOCAINE 2% (20 MG/ML) 5 ML SYRINGE
INTRAMUSCULAR | Status: DC | PRN
  Administered 2018-03-16: 60 mg via INTRAVENOUS

## 2018-03-16 MED ORDER — POTASSIUM CHLORIDE CRYS ER 20 MEQ PO TBCR: 40.0000 meq | EXTENDED_RELEASE_TABLET | Freq: Two times a day (BID) | ORAL | Status: DC

## 2018-03-16 MED ORDER — ONDANSETRON HCL 4 MG/2ML IJ SOLN
INTRAMUSCULAR | Status: DC | PRN
  Administered 2018-03-16: 4 mg via INTRAVENOUS

## 2018-03-16 MED ORDER — DEXAMETHASONE SODIUM PHOSPHATE 10 MG/ML IJ SOLN
INTRAMUSCULAR | Status: DC | PRN
  Administered 2018-03-16: 10 mg via INTRAVENOUS

## 2018-03-16 MED ORDER — FENTANYL CITRATE (PF) 100 MCG/2ML IJ SOLN
INTRAMUSCULAR | Status: AC
  Filled 2018-03-16: qty 2

## 2018-03-16 MED ORDER — PROPOFOL 10 MG/ML IV BOLUS
INTRAVENOUS | Status: DC | PRN
  Administered 2018-03-16: 150 mg via INTRAVENOUS

## 2018-03-16 MED ORDER — ONDANSETRON HCL 4 MG/2ML IJ SOLN: 4.0000 mg | Freq: Once | INTRAMUSCULAR | Status: DC | PRN

## 2018-03-16 MED ORDER — OXYCODONE HCL 5 MG/5ML PO SOLN: 5.0000 mg | Freq: Once | ORAL | Status: AC | PRN

## 2018-03-16 MED ORDER — SODIUM CHLORIDE 0.9 % IV SOLN
INTRAVENOUS | Status: DC | PRN
  Administered 2018-03-16: 50 ug/min via INTRAVENOUS
  Administered 2018-03-16: 25 ug/min via INTRAVENOUS

## 2018-03-16 MED ORDER — LIDOCAINE-EPINEPHRINE (PF) 1 %-1:200000 IJ SOLN
INTRAMUSCULAR | Status: AC
  Filled 2018-03-16: qty 30

## 2018-03-16 MED ORDER — LIDOCAINE 2% (20 MG/ML) 5 ML SYRINGE
INTRAMUSCULAR | Status: AC
  Filled 2018-03-16: qty 5

## 2018-03-16 SURGICAL SUPPLY — 59 items
ARMBAND PINK RESTRICT EXTREMIT (MISCELLANEOUS) ×6 IMPLANT
BAG BANDED W/RUBBER/TAPE 36X54 (MISCELLANEOUS) ×3 IMPLANT
BAG DECANTER FOR FLEXI CONT (MISCELLANEOUS) ×3 IMPLANT
BANDAGE ACE 4X5 VEL STRL LF (GAUZE/BANDAGES/DRESSINGS) ×3 IMPLANT
BIOPATCH RED 1 DISK 7.0 (GAUZE/BANDAGES/DRESSINGS) ×3 IMPLANT
CANISTER SUCT 3000ML PPV (MISCELLANEOUS) ×3 IMPLANT
CATH PALINDROME RT-P 15FX19CM (CATHETERS) ×3 IMPLANT
CATH PALINDROME RT-P 15FX23CM (CATHETERS) IMPLANT
CATH PALINDROME RT-P 15FX28CM (CATHETERS) IMPLANT
CATH PALINDROME RT-P 15FX55CM (CATHETERS) IMPLANT
CLIP VESOCCLUDE MED 6/CT (CLIP) ×3 IMPLANT
CLIP VESOCCLUDE SM WIDE 6/CT (CLIP) ×3 IMPLANT
COVER DOME SNAP 22 D (MISCELLANEOUS) ×3 IMPLANT
COVER PROBE W GEL 5X96 (DRAPES) ×3 IMPLANT
COVER SURGICAL LIGHT HANDLE (MISCELLANEOUS) ×3 IMPLANT
COVER WAND RF STERILE (DRAPES) ×3 IMPLANT
DERMABOND ADHESIVE PROPEN (GAUZE/BANDAGES/DRESSINGS) ×1
DERMABOND ADVANCED (GAUZE/BANDAGES/DRESSINGS) ×1
DERMABOND ADVANCED .7 DNX12 (GAUZE/BANDAGES/DRESSINGS) ×2 IMPLANT
DERMABOND ADVANCED .7 DNX6 (GAUZE/BANDAGES/DRESSINGS) ×2 IMPLANT
DRAPE C-ARM 42X72 X-RAY (DRAPES) ×3 IMPLANT
DRAPE CHEST BREAST 15X10 FENES (DRAPES) ×3 IMPLANT
ELECT REM PT RETURN 9FT ADLT (ELECTROSURGICAL) ×3
ELECTRODE REM PT RTRN 9FT ADLT (ELECTROSURGICAL) ×2 IMPLANT
GAUZE 4X4 16PLY RFD (DISPOSABLE) ×3 IMPLANT
GAUZE SPONGE 4X4 12PLY STRL (GAUZE/BANDAGES/DRESSINGS) ×3 IMPLANT
GLOVE BIO SURGEON STRL SZ7.5 (GLOVE) ×3 IMPLANT
GLOVE BIOGEL PI IND STRL 7.5 (GLOVE) ×2 IMPLANT
GLOVE BIOGEL PI INDICATOR 7.5 (GLOVE) ×1
GLOVE ECLIPSE 7.5 STRL STRAW (GLOVE) ×3 IMPLANT
GOWN STRL REUS W/ TWL LRG LVL3 (GOWN DISPOSABLE) ×4 IMPLANT
GOWN STRL REUS W/ TWL XL LVL3 (GOWN DISPOSABLE) ×2 IMPLANT
GOWN STRL REUS W/TWL LRG LVL3 (GOWN DISPOSABLE) ×2
GOWN STRL REUS W/TWL XL LVL3 (GOWN DISPOSABLE) ×1
KIT BASIN OR (CUSTOM PROCEDURE TRAY) ×3 IMPLANT
KIT TURNOVER KIT B (KITS) ×3 IMPLANT
NEEDLE 18GX1X1/2 (RX/OR ONLY) (NEEDLE) ×3 IMPLANT
NEEDLE HYPO 25GX1X1/2 BEV (NEEDLE) ×3 IMPLANT
NS IRRIG 1000ML POUR BTL (IV SOLUTION) ×3 IMPLANT
PACK CV ACCESS (CUSTOM PROCEDURE TRAY) ×3 IMPLANT
PACK SURGICAL SETUP 50X90 (CUSTOM PROCEDURE TRAY) ×3 IMPLANT
PAD ARMBOARD 7.5X6 YLW CONV (MISCELLANEOUS) ×6 IMPLANT
SOAP 2 % CHG 4 OZ (WOUND CARE) ×3 IMPLANT
STAPLER VISISTAT 35W (STAPLE) ×3 IMPLANT
SUT ETHILON 3 0 PS 1 (SUTURE) ×3 IMPLANT
SUT MNCRL AB 4-0 PS2 18 (SUTURE) ×3 IMPLANT
SUT PROLENE 5 0 C 1 24 (SUTURE) ×9 IMPLANT
SUT PROLENE 6 0 BV (SUTURE) ×12 IMPLANT
SUT VIC AB 3-0 SH 27 (SUTURE) ×2
SUT VIC AB 3-0 SH 27X BRD (SUTURE) ×4 IMPLANT
SYR 10ML LL (SYRINGE) ×3 IMPLANT
SYR 20CC LL (SYRINGE) ×6 IMPLANT
SYR 5ML LL (SYRINGE) ×3 IMPLANT
SYR CONTROL 10ML LL (SYRINGE) ×3 IMPLANT
TOWEL GREEN STERILE (TOWEL DISPOSABLE) ×3 IMPLANT
TOWEL GREEN STERILE FF (TOWEL DISPOSABLE) ×3 IMPLANT
TOWEL OR 17X26 4PK STRL BLUE (TOWEL DISPOSABLE) ×3 IMPLANT
UNDERPAD 30X30 (UNDERPADS AND DIAPERS) ×3 IMPLANT
WATER STERILE IRR 1000ML POUR (IV SOLUTION) ×3 IMPLANT

## 2018-03-16 NOTE — Anesthesia Postprocedure Evaluation (Signed)
Anesthesia Post Note  Patient: Karen Morrow  Procedure(s) Performed: REMOVAL OF ARTERIOVENOUS GORETEX GRAFT (AVGG) RIGHT ARM (Right Arm Upper) PLACEMENT OF TUNNEL DIALYSIS CATHETER (Left Chest)     Patient location during evaluation: PACU Anesthesia Type: General Level of consciousness: awake and alert Pain management: pain level controlled Vital Signs Assessment: post-procedure vital signs reviewed and stable Respiratory status: spontaneous breathing, nonlabored ventilation, respiratory function stable and patient connected to nasal cannula oxygen Cardiovascular status: blood pressure returned to baseline and stable Postop Assessment: no apparent nausea or vomiting Anesthetic complications: no    Last Vitals:  Vitals:   03/16/18 1715 03/16/18 1725  BP: 125/80 133/81  Pulse: 87 87  Resp: 18 17  Temp: 36.5 C   SpO2: 98% 98%    Last Pain:  Vitals:   03/16/18 1715  TempSrc:   PainSc: 1                  Jury Caserta COKER

## 2018-03-16 NOTE — Op Note (Addendum)
    Patient name: Karen Morrow MRN: [id number] DOB: 05/07/1961 Sex: female  03/16/2018 Pre-operative Diagnosis: esrd, infected right arm avf/avg Post-operative diagnosis:  Same Surgeon:  Eda Paschal. Donzetta Matters, MD Assistant: Leontine Locket, PA Procedure Performed: 1.  Placement of left IJ 19 cm tunneled dialysis catheter with ultrasound guidance 2.  Excision of right arm AV graft  Indications: 57 year old female with history of end-stage renal disease previously on dialysis via fistula that was converted to a graft in the middle segment.  She is now admitted with MSSA bacteremia with source being the graft.  She has wound culture that is negative is been on antibiotics greater than 48 hours and is indicated for fistula and graft excision as well as tunneled dialysis catheter.  Findings: The left IJ was patent and compressible and a 19 cm catheter was placed to the SVC atrial junction.  The graft was fully incorporated but there was pus in the midsection and cultures were sent.  The wound was copiously irrigated and the skin was primarily closed with staples after the fistula approximately distally was oversewn with 5-0 Prolene suture.   Procedure:  The patient was identified in the holding area and taken to the operating room where she was placed supine operative table and general anesthesia was induced.  She was initially sterilely prepped and draped in the bilateral neck and chest there is given antibiotics and timeout was called.  We used ultrasound to identify the left IJ which was noted to be patent and compressible.  This was cannulated with 18-gauge needle and a wire was placed centrally under fluoroscopic guidance.  A counterincision was made in 19 cm catheter was tunneled.  The wire tract was serially dilated and introducer sheath was placed under fluoroscopic guidance.  The catheter was then placed to the SVC atrial junction.  It was assembled flushed with saline initially and then affixed to  skin with 3-0 nylon suture.  The neck incision was closed with 4 Monocryl.  Both incisions were covered with Dermabond and sterile dressing.  The catheter itself was locked with 1.5 cc of constrained heparin in both ports.    The drapes were removed she sterilely prepped and draped the right upper extremity usual fashion.  Incisions were made proximally distally to the previous place graft.  We are able to get proximal distal control back to fistula portions of this access.  It was clamped on the fistula.  We then transected the graft.  The graft was fully incorporated although there was pus in the midsection and this was sent for culture.  We had to ultimately connect our 2 proximal and distal incisions to get the graft fully out.  The wound was copiously irrigated and hemostasis was obtained.  The 2 ends of the fistula were oversewn with 5-0 Prolene suture in a running mattress fashion.  We obtained hemostasis and closed the skin with staples primarily.  She was allowed awaken from anesthesia having tolerated procedure well without immediate complication.  All counts were correct at completion.  EBL: 100 cc.    Brandon C. Donzetta Matters, MD [facility] Office: [phone] Pager: [phone]

## 2018-03-16 NOTE — Transfer of Care (Signed)
Immediate Anesthesia Transfer of Care Note  Patient: Karen Morrow  Procedure(s) Performed: REMOVAL OF ARTERIOVENOUS GORETEX GRAFT (AVGG) RIGHT ARM (Right Arm Upper) PLACEMENT OF TUNNEL DIALYSIS CATHETER (Left Chest)  Patient Location: PACU  Anesthesia Type:General  Level of Consciousness: drowsy and patient cooperative  Airway & Oxygen Therapy: Patient Spontanous Breathing and Patient connected to face mask oxygen  Post-op Assessment: Report given to RN and Post -op Vital signs reviewed and stable  Post vital signs: Reviewed and stable  Last Vitals:  Vitals Value Taken Time  BP 137/68 03/16/2018  4:15 PM  Temp    Pulse    Resp 18 03/16/2018  4:17 PM  SpO2    Vitals shown include unvalidated device data.  Last Pain:  Vitals:   03/16/18 1119  TempSrc: Oral  PainSc:       Patients Stated Pain Goal: 0 (91/47/82 9562)  Complications: No apparent anesthesia complications

## 2018-03-16 NOTE — Progress Notes (Signed)
    [facility]   Brief Progress Note:  Karen Morrow continues to receive treatment for MSSA bacteremia and now determined to have an infection located in the fistula. During fistula removal there was pus noted. Cultures were sent. Recommend continuing with 4 weeks of Ancef with dialysis. Adjust new end date to 04/13/18. Continue to follow up with ID as scheduled.   Terri Piedra, NP [facility] [phone] Pager  03/16/2018  5:43 PM

## 2018-03-16 NOTE — Anesthesia Preprocedure Evaluation (Signed)
Anesthesia Evaluation  Patient identified by MRN, date of birth, ID band Patient awake    Reviewed: Allergy & Precautions, NPO status , Patient's Chart, lab work & pertinent test results  Airway Mallampati: II  TM Distance: >3 FB Neck ROM: Full    Dental  (+) Poor Dentition, Dental Advisory Given   Pulmonary Current Smoker,    breath sounds clear to auscultation       Cardiovascular hypertension,  Rhythm:Regular Rate:Normal     Neuro/Psych    GI/Hepatic   Endo/Other    Renal/GU      Musculoskeletal   Abdominal   Peds  Hematology   Anesthesia Other Findings   Reproductive/Obstetrics                             Anesthesia Physical Anesthesia Plan  ASA: III  Anesthesia Plan: General   Post-op Pain Management:    Induction: Intravenous  PONV Risk Score and Plan: Ondansetron  Airway Management Planned: LMA  Additional Equipment:   Intra-op Plan:   Post-operative Plan:   Informed Consent: I have reviewed the patients History and Physical, chart, labs and discussed the procedure including the risks, benefits and alternatives for the proposed anesthesia with the patient or authorized representative who has indicated his/her understanding and acceptance.   Dental advisory given  Plan Discussed with: CRNA and Anesthesiologist  Anesthesia Plan Comments:         Anesthesia Quick Evaluation

## 2018-03-16 NOTE — Progress Notes (Signed)
Transport here to take patient to scheduled HD.  Patient refused, stating she was to have an HD cath placed after removal of her fistula.  Wants HD re-scheduled until this afternoon.  HD nurse informed.

## 2018-03-16 NOTE — Anesthesia Procedure Notes (Signed)
Procedure Name: LMA Insertion Performed by: Bryson Corona, CRNA Pre-anesthesia Checklist: Patient identified, Emergency Drugs available, Suction available and Patient being monitored Patient Re-evaluated:Patient Re-evaluated prior to induction Oxygen Delivery Method: Circle System Utilized Preoxygenation: Pre-oxygenation with 100% oxygen Induction Type: IV induction Ventilation: Mask ventilation without difficulty LMA: LMA inserted LMA Size: 4.0 Number of attempts: 1 Placement Confirmation: positive ETCO2 Tube secured with: Tape Dental Injury: Teeth and Oropharynx as per pre-operative assessment

## 2018-03-16 NOTE — Progress Notes (Signed)
[facility]  Subjective  - overall feels better   Objective 102/79 87 98.7 F (37.1 C) (Oral) 16 95%  Intake/Output Summary (Last 24 hours) at 03/16/2018 0806 Last data filed at 03/16/2018 0400 Gross per 24 hour  Intake 494 ml  Output 1100 ml  Net -606 ml   Right arm graft now with increased erythema and fluctuance central portion  Assessment/Planning: [place] with perigraft fluid Increased fluctuance erythema central portion  Will make NPO.  Ate breakfast 7 am  Will remove graft and place catheter later today  Ruta Hinds 03/16/2018 8:06 AM --  Laboratory Lab Results: Recent Labs    03/15/18 0439 03/16/18 0650  WBC 9.3 8.0  HGB 10.1* 10.1*  HCT 30.3* 29.9*  PLT 141* 173   BMET Recent Labs    03/15/18 0439 03/16/18 0650  NA 133* 133*  K 3.9 3.2*  CL 92* 95*  CO2 23 26  GLUCOSE 78 95  BUN 49* 20  CREATININE 8.55* 5.51*  CALCIUM 9.2 8.4*    COAG Lab Results  Component Value Date   INR 0.97 03/10/2018   INR 1.06 09/07/2016   INR 1.02 11/27/2013   No results found for: PTT

## 2018-03-16 NOTE — Progress Notes (Addendum)
[facility] Progress Note   Subjective:   Patient seen at bedside.  Complains of worsening pain and swelling around access.   Objective Vitals:   03/16/18 0000 03/16/18 0432 03/16/18 0500 03/16/18 1119  BP: 124/77 102/79    Pulse: 87     Resp: (!) 21 16    Temp: 98.4 F (36.9 C) 98.7 F (37.1 C)  98.8 F (37.1 C)  TempSrc: Oral Oral  Oral  SpO2: 94% 95%  99%  Weight:   60.3 kg   Height:       Physical Exam General:NAD, chronically ill appearing female Heart:RRR, +3/2 systolic murmur Lungs:+wheeze throughout Abdomen:soft, NTND Extremities:no LE edema Dialysis Access: RU AVG +b/t   Filed Weights   03/15/18 0500 03/15/18 1305 03/16/18 0500  Weight: 61.3 kg 60.4 kg 60.3 kg    Intake/Output Summary (Last 24 hours) at 03/16/2018 1136 Last data filed at 03/16/2018 1013 Gross per 24 hour  Intake 214 ml  Output 1100 ml  Net -886 ml    Additional Objective Labs: Basic Metabolic Panel: Recent Labs  Lab 03/11/18 0323 03/12/18 0333  03/13/18 1538 03/15/18 0439 03/16/18 0650  NA 134* 132*   < > 136 133* 133*  K 4.8 5.0   < > 3.5 3.9 3.2*  CL 95* 92*   < > 95* 92* 95*  CO2 22 21*   < > 28 23 26   GLUCOSE 70 75   < > 97 78 95  BUN 31* 57*   < > 22* 49* 20  CREATININE 7.01* 9.00*   < > 4.71* 8.55* 5.51*  CALCIUM 9.1 9.1   < > 9.0 9.2 8.4*  PHOS 5.8* 5.5*  --   --   --   --    < > = values in this interval not displayed.   Liver Function Tests: Recent Labs  Lab 03/13/18 1538 03/15/18 0439 03/16/18 0650  AST 144* 59* 51*  ALT 33 9 8  ALKPHOS 131* 124 117  BILITOT 1.1 0.6 0.8  PROT 6.6 6.8 6.2*  ALBUMIN 2.6* 2.4* 2.3*   CBC: Recent Labs  Lab 03/10/18 0509  03/12/18 0333 03/13/18 0447 03/14/18 0455 03/15/18 0439 03/16/18 0650  WBC 8.4   < > 5.6 6.8 7.4 9.3 8.0  NEUTROABS 7.7  --  4.1  --   --   --   --   HGB 11.0*   < > 10.1* 9.8* 10.2* 10.1* 10.1*  HCT 33.4*   < > 29.8* 29.6* 30.9* 30.3* 29.9*  MCV 92.0   < > 90.9 90.8 91.2 91.0 91.7   PLT 154   < > 89* 98* 103* 141* 173   < > = values in this interval not displayed.   CBG: Recent Labs  Lab 03/14/18 0021 03/14/18 0757 03/15/18 0741 03/15/18 1728 03/16/18 0740  GLUCAP 115* 80 73 82 137*   Studies/Results: Ct Head Wo Contrast  Result Date: 03/15/2018 CLINICAL DATA:  Subacute neurologic deficits. EXAM: CT HEAD WITHOUT CONTRAST TECHNIQUE: Contiguous axial images were obtained from the base of the skull through the vertex without intravenous contrast. COMPARISON:  None. FINDINGS: Brain: Superficial atrophy and moderate degree of periventricular and subcortical white matter hypoattenuation compatible with microvascular ischemia, advanced for age. No hydrocephalus. Midline fourth ventricle and basal cisterns. No intra-axial mass nor extra-axial fluid collections. Age-indeterminate but likely chronic pontine lacunar infarcts. Vascular: No hyperdense vessel sign. Moderate degree of atherosclerosis at the skull base involving the carotid siphons. Skull: No acute nor  suspicious osseous lesions. Sinuses/Orbits: No acute finding. Other: None. IMPRESSION: Superficial atrophy and likely chronic moderate small vessel ischemic disease of periventricular white matter. Age indeterminate but likely chronic pontine lacunar infarcts. Densely calcified carotid siphons bilaterally supporting the likelihood of chronic microvascular ischemic change. Electronically Signed   By: Ashley Royalty M.D.   On: 03/15/2018 23:59    Medications: . sodium chloride 10 mL/hr (03/14/18 1104)  .  ceFAZolin (ANCEF) IV 1 g (03/15/18 2302)   . Chlorhexidine Gluconate Cloth  6 each Topical Q0600  . heparin  5,000 Units Subcutaneous Q8H  . multivitamin  1 tablet Oral QHS  . pantoprazole  40 mg Oral BID    Dialysis Orders: [facility] [place] 3h 68min 63.5kg 2/2.5 bath Hep none  - hect 3 ug - mircera 30 last on 10/8  home meds: - amlodipine 10/ metoprolol tartrate 25 bid - prednisone qd/ pantoprazole 20  bid/ nicotine gum/ calcium acetate tid ac - albuterol nebs and ACT prn  Assessment/Plan: 1. MSSA bacteremia - thought to be due to infected AVG- w/ high fevers/ AMS. AMS resolved. Afebrile for 24hrs. On Ancef. BC 10/12 NGTD. TEE aborted due to technical difficulties. ID following recommend treating for endocarditis w/ Ancef x6weeks w/HD. Discussed with OP HD facility and they have Ancef in center.  2. ?Infected AVG - VVS following.  ^erythema/fluctuance. US showed perigraft fluid.  Plan for graft removal and placement of TDC today per Dr. Oneida Alar.  3. L sided abd pain - chronic? Had neg Abd CT on admission 4. ESRD -TTS HD.K 3.2.  No HD today, has had 3HD this week, will plan for HD Saturday per regular schedule.  5. Volume /pulm edema -CHF resolved,volume improved, now under edw, willcont to lower dry wt 6. Hypertension - BPwell controlled.cont meds 7. AnemiaOf ckd - Hb 10.1, ESA recently dosed on 10/8.  No indication for ESA at this time. Follow trends post surgery. 8. Metabolic bone disease -Ca and phos in goal.Cont meds 9. Nutrition -Alb 2.3.renal dietw/ fluid restrictions, renavite, Nepro 10. AMS - per husband pt's cognition not at baseline, requests CT which showed chronic microvascular ischemic changes.   Jen Mow, PA-C [facility] Pager: [phone] 03/16/2018,11:36 AM  LOS: 6 days   Pt seen, examined and agree w A/P as above.  Kelly Splinter MD [employer] pager [phone]   03/16/2018, 2:24 PM

## 2018-03-16 NOTE — Progress Notes (Signed)
   Subjective: Karen Morrow reports that she is doing okay.  No acute events overnight.  She reports that she slept well.  Denies any fevers or chills overnight.  We discussed the results of her CT scan and plan for today and she is in agreement.  Objective:  Vital signs in last 24 hours: Vitals:   03/15/18 2234 03/16/18 0000 03/16/18 0432 03/16/18 0500  BP: 102/73 124/77 102/79   Pulse: 92 87    Resp: (!) 22 (!) 21 16   Temp:  98.4 F (36.9 C) 98.7 F (37.1 C)   TempSrc:  Oral Oral   SpO2: 96% 94% 95%   Weight:    60.3 kg  Height:        General: Resting comfortably, no acute distress Cardiac: RRR, normal S1, S2, holosystolic murmur Pulmonary: Lungs CTA bilaterally, no wheezing, rhonchi or rales  Abdomen: Soft, non-tender, +bowel sounds, no guarding or masses noted  Extremity:Right upper extremity fistula in place, tender to palpation Psychiatry: Normal mood and affect     Assessment/Plan:  Active Problems:   ESRD (end stage renal disease) (HCC)   Sepsis (HCC)   Hyperkalemia   Acute respiratory distress   Staphylococcal sepsis (HCC)   Bacteremia  [name] with history of hypertension, end-stage renal disease( HD on TuThSa), anemia and secondary hyperparathyroidism who presented with fever, generalized weakness, altered mental status after missing a dialysis session.  Initially admitted for sepsis and started treatment with broad-spectrum antibiotics.  She required some time in the ICU for altered mental status, respiratory distress, and SVT with heart rates in the 180s.  [name]  Currently on antibiotic coverage for possible endocarditis.   Sepsis:  Probable source is AV fistula, ultrasound showed perigraft fluid, area of increased erythema is tender to palpation.  She reports significant pain with hemodialysis.  She is receiving Ancef initially for the suspected endocarditis however will need to adjust  antibiotic course.  Vascular surgery is on board and will be removing the graft and placing a catheter later today.  She has been was concerned about her mental status yesterday and his CT was ordered.  CT scan showed chronic pontine infarcts and calcified carotid siphons with microvascular ischemic changes. -Repeat blood cultures on 10/12 showed no growth to date x4 -Right upper quadrant ultrasound showed subcutaneous hypoechoic complex fluid collection adjacent to but not contiguous with AV fistula.  Possible subcutaneous soft tissue abscess or hematoma. -Plan for graft removal and catheter placement later today -Hemodialysis later today -Continue Ancef (day 7) for now,  -We will continue to monitor her symptoms -PT/OT  End-stage renal disease on hemodialysis Tuesday Thursday Saturday:  She had hemodialysis yesterday.  -Today her BUN was 20 creatinine 5.51, potassium 3.2, bicarb 26. -?Hemodialysis today after catheter placement  Epigastric and right upper quadrant pain: She presented with some abdominal pain, she has no complaints of pain today. -Continue Protonix 40 mg twice daily -MiraLAX as needed  Hypertension: Blood pressure seems well controlled.  -Continue home amlodipine and metoprolol  Hypokalemia:  Potassium today was 3.2, is getting hemodialysis today.  FEN: No fluids, replete lytes prn, Regular diet  VTE ppx: Heparin Code Status: FULL    Dispo: Anticipated discharge in approximately 1-2 day(s).   Asencion Noble, MD 03/16/2018, 6:37 AM Pager: [phone]

## 2018-03-16 NOTE — Progress Notes (Addendum)
Internal Medicine Attending:   I saw and examined the patient. I reviewed the resident's note and I agree with the resident's findings and plan as documented in the resident's note.  Patient complaining of some pain at the AV graft site but no fevers.  Patient was initially admitted with sepsis likely secondary to infected AV graft.  Vascular surgery follow-up and recommendations appreciated.  Patient is now status post AV graft removal and temporary HD catheter placement.  Repeat blood cultures have been negative to date.  Patient has remained afebrile.  In the OR patient was noted to have a fully incorporated graft but there was pus in the midsection of the graft and cultures were sent.  We will follow-up on the cultures.  Continue with IV Ancef for now.  Case discussed with ID.  ID will put in recommendations regarding duration of antibiotics for infected graft.  Initially we were going to treat the patient with 6 weeks of IV antibiotics for possible endocarditis but given that this is now likely secondary to an infected graft the duration of antibiotics may be shorter.  We will follow-up ID recommendations. Nephrology follow-up and recommendations appreciated.  Continue with hemodialysis per nephrology.

## 2018-03-16 NOTE — Evaluation (Signed)
Physical Therapy Evaluation Patient Details Name: Karen Morrow MRN: [id number] DOB: [birth year] Today's Date: 03/16/2018   History of Present Illness  57 year old female with history of hypertension, end-stage renal disease( HD on TuThSa), anemia and secondary hyperparathyroidism who presented with fever, generalized weakness, altered mental status after missing a dialysis session.  Initially admitted for sepsis and started treatment with broad-spectrum antibiotics.  She required some time in the ICU for altered mental status, respiratory distress, and SVT with heart rates in the 180s, possible endocarditis  Clinical Impression  Pt admitted with above diagnosis. Pt currently with functional limitations due to the deficits listed below (see PT Problem List). Pt independent at her baseline, unsteady gait requiring min assist to amb 80', significantly fatigued after this distance, HR max 110; would benefit from cane but declines; will continue to follow in acute setting--HHPT vs no f/u  depending on progress/acute LOS;  Pt will benefit from skilled PT to increase their independence and safety with mobility to allow discharge to the venue listed below.       Follow Up Recommendations Home health PT(vs no f/u pending progress)    Equipment Recommendations  None recommended by PT(pt declines)    Recommendations for Other Services       Precautions / Restrictions Precautions Precautions: Fall Restrictions Weight Bearing Restrictions: No      Mobility  Bed Mobility               General bed mobility comments: NT--pt in chair on arrival  Transfers Overall transfer level: Needs assistance   Transfers: Sit to/from Stand Sit to Stand: Min guard         General transfer comment: to steady upon rising  Ambulation/Gait Ambulation/Gait assistance: Min assist Gait Distance (Feet): 80 Feet Assistive device: 1 person hand held assist;None Gait Pattern/deviations: Step-through  pattern;Decreased stride length;Trunk flexed;Narrow base of support     General Gait Details: cues for trunk extension and incr step length; pt attempts to use rail in hallway or requires HHA for stability; she refuses assistive device for amb; fatigues quickly  Stairs            Wheelchair Mobility    Modified Rankin (Stroke Patients Only)       Balance Overall balance assessment: History of Falls(denies falls other than the one prior to admission related to illness/weakness)   Sitting balance-Leahy Scale: Good       Standing balance-Leahy Scale: Fair Standing balance comment: reliant on UE support for wt shifting--dynamic tasks                             Pertinent Vitals/Pain Pain Assessment: No/denies pain    Home Living Family/patient expects to be discharged to:: Private residence Living Arrangements: Spouse/significant other(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[employer], environmental services) Available Help at Discharge: Family Type of Home: Apartment Home Access: Level entry     Home Layout: One level Home Equipment: None      Prior Function Level of Independence: Independent               Hand Dominance        Extremity/Trunk Assessment   Upper Extremity Assessment Upper Extremity Assessment: Overall WFL for tasks assessed    Lower Extremity Assessment Lower Extremity Assessment: Generalized weakness       Communication   Communication: No difficulties  Cognition Arousal/Alertness: Awake/alert Behavior During Therapy: WFL for tasks assessed/performed Overall Cognitive Status: Within Functional Limits  for tasks assessed                                        General Comments      Exercises     Assessment/Plan    PT Assessment Patient needs continued PT services  PT Problem List         PT Treatment Interventions Therapeutic activities;Gait training;Functional mobility training;Therapeutic exercise;Patient/family  education;Balance training    PT Goals (Current goals can be found in the Care Plan section)  Acute Rehab PT Goals Patient Stated Goal: home soon PT Goal Formulation: With patient Time For Goal Achievement: 03/30/18 Potential to Achieve Goals: Good    Frequency Min 3X/week   Barriers to discharge        Co-evaluation               AM-PAC PT "6 Clicks" Daily Activity  Outcome Measure Difficulty turning over in bed (including adjusting bedclothes, sheets and blankets)?: A Little Difficulty moving from lying on back to sitting on the side of the bed? : A Little Difficulty sitting down on and standing up from a chair with arms (e.g., wheelchair, bedside commode, etc,.)?: Unable Help needed moving to and from a bed to chair (including a wheelchair)?: A Little Help needed walking in hospital room?: A Little Help needed climbing 3-5 steps with a railing? : A Little 6 Click Score: 16    End of Session Equipment Utilized During Treatment: Gait belt Activity Tolerance: Patient tolerated treatment well;Patient limited by fatigue Patient left: in chair;with call bell/phone within reach   PT Visit Diagnosis: Difficulty in walking, not elsewhere classified (R26.2);Muscle weakness (generalized) (M62.81)    Time: 1014-1030 PT Time Calculation (min) (ACUTE ONLY): 16 min   Charges:   PT Evaluation $PT Eval Low Complexity: 1 Low          Kenyon Ana, PT  Pager: [phone] Acute Rehab Dept [facility]): [phone]   03/16/2018   [facility] 03/16/2018, 10:44 AM

## 2018-03-16 NOTE — Progress Notes (Signed)
   Central portion of right arm AV fistula appears infected.  We will remove at least the graft aspect of this in place tunneled dialysis catheter in OR today.  I discussed the risk benefits and alternatives she agrees to proceed.  [name] C. Donzetta Matters, MD [facility] Office: [phone] Pager: [phone]

## 2018-03-17 ENCOUNTER — Telehealth: Payer: Self-pay | Admitting: Vascular Surgery

## 2018-03-17 ENCOUNTER — Encounter (HOSPITAL_COMMUNITY): Payer: Self-pay | Admitting: Vascular Surgery

## 2018-03-17 DIAGNOSIS — R011 Cardiac murmur, unspecified: Secondary | ICD-10-CM

## 2018-03-17 DIAGNOSIS — T827XXD Infection and inflammatory reaction due to other cardiac and vascular devices, implants and grafts, subsequent encounter: Secondary | ICD-10-CM

## 2018-03-17 LAB — BASIC METABOLIC PANEL
Anion gap: 18 — ABNORMAL HIGH (ref 5–15)
BUN: 32 mg/dL — ABNORMAL HIGH (ref 6–20)
CO2: 23 mmol/L (ref 22–32)
Calcium: 8.7 mg/dL — ABNORMAL LOW (ref 8.9–10.3)
Chloride: 91 mmol/L — ABNORMAL LOW (ref 98–111)
Creatinine, Ser: 7.12 mg/dL — ABNORMAL HIGH (ref 0.44–1.00)
GFR calc Af Amer: 7 mL/min — ABNORMAL LOW (ref >60)
GFR calc non Af Amer: 6 mL/min — ABNORMAL LOW (ref >60)
Glucose, Bld: 102 mg/dL — ABNORMAL HIGH (ref 70–99)
Potassium: 3.9 mmol/L (ref 3.5–5.1)
Sodium: 132 mmol/L — ABNORMAL LOW (ref 135–145)

## 2018-03-17 LAB — CBC
HCT: 29.7 % — ABNORMAL LOW (ref 36.0–46.0)
Hemoglobin: 9.8 g/dL — ABNORMAL LOW (ref 12.0–15.0)
MCH: 30.4 pg (ref 26.0–34.0)
MCHC: 33 g/dL (ref 30.0–36.0)
MCV: 92.2 fL (ref 80.0–100.0)
Platelets: 183 10*3/uL (ref 150–400)
RBC: 3.22 MIL/uL — ABNORMAL LOW (ref 3.87–5.11)
RDW: 14.7 % (ref 11.5–15.5)
WBC: 10.6 10*3/uL — ABNORMAL HIGH (ref 4.0–10.5)
nRBC: 0 % (ref 0.0–0.2)

## 2018-03-17 LAB — GLUCOSE, CAPILLARY: Glucose-Capillary: 102 mg/dL — ABNORMAL HIGH (ref 70–99)

## 2018-03-17 MED ORDER — CEFAZOLIN SODIUM-DEXTROSE 1-4 GM/50ML-% IV SOLN
1.0000 g | Freq: Every day | INTRAVENOUS | Status: AC
  Administered 2018-03-17: 1 g via INTRAVENOUS
  Filled 2018-03-17: qty 50

## 2018-03-17 MED ORDER — CEFAZOLIN SODIUM-DEXTROSE 2-4 GM/100ML-% IV SOLN
2.0000 g | INTRAVENOUS | Status: DC
  Filled 2018-03-17: qty 100

## 2018-03-17 MED ORDER — DOXERCALCIFEROL 4 MCG/2ML IV SOLN
3.0000 ug | INTRAVENOUS | Status: DC
  Administered 2018-03-18: 3 ug via INTRAVENOUS
  Filled 2018-03-17: qty 2

## 2018-03-17 MED ORDER — OXYCODONE-ACETAMINOPHEN 5-325 MG PO TABS: 1.0000 | ORAL_TABLET | ORAL | Status: DC | PRN

## 2018-03-17 NOTE — Telephone Encounter (Signed)
-----   Message from Waynetta Sandy, MD sent at 03/17/2018  8:25 AM EDT ----- Karen Morrow [id number] [birth year]  F/u with me,np or pa in 2-3 weeks for staple removal.  bc

## 2018-03-17 NOTE — Progress Notes (Addendum)
[facility] Progress Note   Subjective:  Seen coming up for HD -- reviewing records, looks like does not need HD today, will get back on usual HD schedule tomorrow. S/p L AVG excision and new L TDC placement yesterday. Sore. Denies CP or dyspnea.  Objective Vitals:   03/17/18 0500 03/17/18 0503 03/17/18 0802 03/17/18 1221  BP:      Pulse:      Resp:      Temp:  97.9 F (36.6 C) 98 F (36.7 C) 98.3 F (36.8 C)  TempSrc:  Oral Oral Oral  SpO2:      Weight: 60.7 kg     Height:       Physical Exam General: Well appearing woman, NAD Heart: RRR; 3/6 systolic murmur Lungs: CTAB Abdomen: soft, non-tender Extremities: No LE edema Dialysis Access: TDC in L chest, excised RUE AVG (staples intact)  Additional Objective Labs: Basic Metabolic Panel: Recent Labs  Lab 03/11/18 0323 03/12/18 0333  03/15/18 0439 03/16/18 0650 03/17/18 0729  NA 134* 132*   < > 133* 133* 132*  K 4.8 5.0   < > 3.9 3.2* 3.9  CL 95* 92*   < > 92* 95* 91*  CO2 22 21*   < > 23 26 23   GLUCOSE 70 75   < > 78 95 102*  BUN 31* 57*   < > 49* 20 32*  CREATININE 7.01* 9.00*   < > 8.55* 5.51* 7.12*  CALCIUM 9.1 9.1   < > 9.2 8.4* 8.7*  PHOS 5.8* 5.5*  --   --   --   --    < > = values in this interval not displayed.   Liver Function Tests: Recent Labs  Lab 03/13/18 1538 03/15/18 0439 03/16/18 0650  AST 144* 59* 51*  ALT 33 9 8  ALKPHOS 131* 124 117  BILITOT 1.1 0.6 0.8  PROT 6.6 6.8 6.2*  ALBUMIN 2.6* 2.4* 2.3*   CBC: Recent Labs  Lab 03/12/18 0333 03/13/18 0447 03/14/18 0455 03/15/18 0439 03/16/18 0650 03/17/18 0729  WBC 5.6 6.8 7.4 9.3 8.0 10.6*  NEUTROABS 4.1  --   --   --   --   --   HGB 10.1* 9.8* 10.2* 10.1* 10.1* 9.8*  HCT 29.8* 29.6* 30.9* 30.3* 29.9* 29.7*  MCV 90.9 90.8 91.2 91.0 91.7 92.2  PLT 89* 98* 103* 141* 173 183   Blood Culture    Component Value Date/Time   SDES ABSCESS RT ARM GRAFT 03/16/2018 1544   SPECREQUEST NONE 03/16/2018 1544   CULT   03/16/2018 1544    CULTURE REINCUBATED FOR BETTER GROWTH Performed at [facility], [place] [address], [place], [place] [address]    REPTSTATUS PENDING 03/16/2018 1544   Studies/Results: Ct Head Wo Contrast  Result Date: 03/15/2018 CLINICAL DATA:  Subacute neurologic deficits. EXAM: CT HEAD WITHOUT CONTRAST TECHNIQUE: Contiguous axial images were obtained from the base of the skull through the vertex without intravenous contrast. COMPARISON:  None. FINDINGS: Brain: Superficial atrophy and moderate degree of periventricular and subcortical white matter hypoattenuation compatible with microvascular ischemia, advanced for age. No hydrocephalus. Midline fourth ventricle and basal cisterns. No intra-axial mass nor extra-axial fluid collections. Age-indeterminate but likely chronic pontine lacunar infarcts. Vascular: No hyperdense vessel sign. Moderate degree of atherosclerosis at the skull base involving the carotid siphons. Skull: No acute nor suspicious osseous lesions. Sinuses/Orbits: No acute finding. Other: None. IMPRESSION: Superficial atrophy and likely chronic moderate small vessel ischemic disease of periventricular  white matter. Age indeterminate but likely chronic pontine lacunar infarcts. Densely calcified carotid siphons bilaterally supporting the likelihood of chronic microvascular ischemic change. Electronically Signed   By: Ashley Royalty M.D.   On: 03/15/2018 23:59   Dg Chest Port 1 View  Result Date: 03/16/2018 CLINICAL DATA:  Dialysis catheter placement. EXAM: PORTABLE CHEST 1 VIEW COMPARISON:  03/11/2018 FINDINGS: A left jugular dialysis catheter has been placed and terminates over the lower SVC/cavoatrial junction. The cardiac silhouette remains mildly enlarged. Aortic atherosclerosis is noted. Lung volumes are lower than on the prior study with minimal atelectasis in the left lung base. Blunting of the right costophrenic angle is unchanged and could reflect a trace pleural effusion.  No pneumothorax is identified. No acute osseous abnormality is seen. IMPRESSION: 1. Left jugular dialysis catheter placement as above. No pneumothorax. 2. Hypoinflation with minimal left basilar atelectasis and possible trace right pleural effusion. Electronically Signed   By: Logan Bores M.D.   On: 03/16/2018 19:12   Medications: . sodium chloride 10 mL/hr (03/14/18 1104)  . sodium chloride 10 mL/hr at 03/16/18 1420  .  ceFAZolin (ANCEF) IV Stopped (03/17/18 0701)   . Chlorhexidine Gluconate Cloth  6 each Topical Q0600  . heparin  5,000 Units Subcutaneous Q8H  . multivitamin  1 tablet Oral QHS  . pantoprazole  40 mg Oral BID    Dialysis Orders: TTS at [facility] 3h 44min 63.5kg 2/2.5 bath Hep none  - hect 3 ug - mircera 30 last on 10/8  home meds: - amlodipine 10/ metoprolol tartrate 25 bid - prednisone qd/ pantoprazole 20 bid/ nicotine gum/ calcium acetate tid ac - albuterol nebs and ACT prn  Assessment/Plan: 1. MSSA bacteremia: D/t infected AVG. TEE aborted, covering with Cefazolin x 6 weeks. RUE AVG excised 10/17 and TDC placed by Dr. Oneida Alar. 2. L sided abd pain - chronic? Had neg Abd CT on admission 3. ESRD: Usual TTS schedule -- off this week, will get back on usual schedule. Next HD 10/19. 4. Pulm edema-CHF resolved,volume improved, now under edw, willcont to lower dry wt 5. Hypertension - BPwell controlled.cont meds 6. Anemia of ESRD: Hgb 9.8, ESA last dosed on10/8. No indication for ESA at this time. 7. Metabolic bone disease: Ca/Phos ok. No binders at this time. Restart Hectoral with next HD. 8. Nutrition -Alb 2.3.renal dietw/ fluid restrictions, renavite, Nepro 9. AMS: On admit. Head CT showed chronic microvascular ischemic changes. Improved.  Veneta Penton, PA-C 03/17/2018, 12:29 PM  [facility] Pager: [phone]  Pt seen, examined and agree w A/P as above.  Kelly Splinter MD [employer] pager [phone]    03/17/2018, 4:59 PM

## 2018-03-17 NOTE — Progress Notes (Signed)
Physical Therapy Treatment Patient Details Name: Karen Morrow MRN: [id number] DOB: [birth year] Today's Date: 03/17/2018    History of Present Illness 57 year old female with history of hypertension, end-stage renal disease( HD on TuThSa), anemia and secondary hyperparathyroidism who presented with fever, generalized weakness, altered mental status after missing a dialysis session.  Initially admitted for sepsis and started treatment with broad-spectrum antibiotics.  She required some time in the ICU for altered mental status, respiratory distress, and SVT with heart rates in the 180s, possible endocarditis    PT Comments    Patient progressing slowly towards her physical therapy goals. Ambulating 100 feet with handheld assistance and hallway railing. Displays static and dynamic standing balance deficits and decreased endurance. Now agreeable to ambulate with walker at home and updated equipment recommendations. Will continue to progress mobility as tolerated.    Follow Up Recommendations  Home health PT     Equipment Recommendations  Rolling walker with 5" wheels    Recommendations for Other Services       Precautions / Restrictions Precautions Precautions: Fall Restrictions Weight Bearing Restrictions: No    Mobility  Bed Mobility Overal bed mobility: Needs Assistance Bed Mobility: Supine to Sit;Sit to Supine     Supine to sit: Min assist Sit to supine: Min assist   General bed mobility comments: Patient seeking handheld support for trunk elevation and assist for BLE's back into bed at end of session due to fatigue  Transfers Overall transfer level: Needs assistance Equipment used: 1 person hand held assist Transfers: Sit to/from Stand Sit to Stand: Min assist         General transfer comment: Light min assist to power up to stand  Ambulation/Gait Ambulation/Gait assistance: Min assist Gait Distance (Feet): 100 Feet Assistive device: 1 person hand held  assist Gait Pattern/deviations: Step-through pattern;Decreased stride length;Narrow base of support;Shuffle Gait velocity: decreased Gait velocity interpretation: <1.8 ft/sec, indicate of risk for recurrent falls General Gait Details: Patient declining use of assistive device, instead utilizing one person hand held support and hallway railing. Increased reliance through hand support with increased distance. Fatigues quickly   Marine scientist Rankin (Stroke Patients Only)       Balance Overall balance assessment: Needs assistance Sitting-balance support: Bilateral upper extremity supported;Feet supported Sitting balance-Leahy Scale: Good     Standing balance support: Single extremity supported;During functional activity Standing balance-Leahy Scale: Fair                              Cognition Arousal/Alertness: Awake/alert Behavior During Therapy: WFL for tasks assessed/performed Overall Cognitive Status: Within Functional Limits for tasks assessed                                        Exercises      General Comments        Pertinent Vitals/Pain Pain Assessment: Faces Faces Pain Scale: Hurts a little bit Pain Location: RUE Pain Descriptors / Indicators: Sore Pain Intervention(s): Premedicated before session;Monitored during session    Home Living                      Prior Function            PT Goals (current goals can now be  found in the care plan section) Acute Rehab PT Goals Patient Stated Goal: home soon Potential to Achieve Goals: Good Progress towards PT goals: Progressing toward goals    Frequency    Min 3X/week      PT Plan Equipment recommendations need to be updated    Co-evaluation              AM-PAC PT "6 Clicks" Daily Activity  Outcome Measure  Difficulty turning over in bed (including adjusting bedclothes, sheets and blankets)?: A  Little Difficulty moving from lying on back to sitting on the side of the bed? : Unable Difficulty sitting down on and standing up from a chair with arms (e.g., wheelchair, bedside commode, etc,.)?: Unable Help needed moving to and from a bed to chair (including a wheelchair)?: A Little Help needed walking in hospital room?: A Little Help needed climbing 3-5 steps with a railing? : A Lot 6 Click Score: 13    End of Session Equipment Utilized During Treatment: Gait belt Activity Tolerance: Patient tolerated treatment well Patient left: in bed;with call bell/phone within reach   PT Visit Diagnosis: Difficulty in walking, not elsewhere classified (R26.2);Muscle weakness (generalized) (M62.81)     Time: 0221-7981 PT Time Calculation (min) (ACUTE ONLY): 16 min  Charges:  $Therapeutic Activity: 8-22 mins                     Ellamae Sia, PT, DPT Acute Rehabilitation Services Pager [phone] Office [phone]   Willy Eddy 03/17/2018, 4:42 PM

## 2018-03-17 NOTE — Telephone Encounter (Signed)
sch appt spk to pt 04/07/18 915 staple removal

## 2018-03-17 NOTE — Progress Notes (Signed)
Internal Medicine Attending:   I saw and examined the patient. I reviewed the resident's note and I agree with the resident's findings and plan as documented in the resident's note.  Patient feels well today but does complain of some pain at her hemodialysis catheter site.  Patient initially admitted with sepsis secondary to MSSA bacteremia from an infected AV graft.  She is status post AV graft removal and temporary HD catheter placement yesterday.  Case discussed with ID yesterday.  Patient to complete a four-week course of IV Ancef with hemodialysis.  End date would be 04/13/2018.  Patient follow-up with ID as an outpatient. Vascular surgery follow-up recommendations appreciated.  Patient is stable for discharge from a vascular standpoint.  No further work-up at this time.  Patient scheduled for HD tomorrow.  If she continues to improve she should be stable for discharge home tomorrow after hemodialysis as long as her antibiotics at dialysis have been arranged for.

## 2018-03-17 NOTE — Progress Notes (Signed)
   Subjective: Karen Morrow is doing well today, no acute events overnight.  She reports that she is having some right arm pain and some pain in her left upper shoulder.  She was not aware that she had pain medications available.  Reminded her that she can ask for these when she is in pain.  She is feeling much better compared to when she came in.  Discussed plan for today and she is in agreement.  Objective:  Vital signs in last 24 hours: Vitals:   03/16/18 2133 03/17/18 0045 03/17/18 0500 03/17/18 0503  BP:  115/86    Pulse:      Resp:  12    Temp: 97.6 F (36.4 C) 97.6 F (36.4 C)  97.9 F (36.6 C)  TempSrc: Oral Oral  Oral  SpO2:      Weight:   60.7 kg   Height:        General: Well nourished, well appearing, NAD  Cardiac: RRR, normal S1, S2, holosystolic murmur, left upper catheter in place, no significant tenderness around this area Pulmonary: Lungs CTA bilaterally, no wheezing, rhonchi or rales  Abdomen: Soft, non-tender, +bowel sounds, no guarding or masses noted  Extremity: Right upper extremity staples in place, no erythema, or drainage Psychiatry: Normal mood and affect     Assessment/Plan:  Active Problems:   ESRD (end stage renal disease) (HCC)   Sepsis (HCC)   Hyperkalemia   Acute respiratory distress   Staphylococcal sepsis (HCC)   Bacteremia  [name] with history of hypertension, end-stage renal disease( HD on TuThSa),anemia and secondary hyperparathyroidism who presented with fever, generalized weakness, altered mental status after missing a dialysis session. Initially admitted for sepsis and started treatment with broad-spectrum antibiotics. She required some time in the ICU for altered mental status,respiratory distress, and SVT with heart rates in the 180s. [name]  Found to have an infected AVF in her right upper extremity. [name] and catheter was  placed for dialysis.  Sepsis secondary to AV fistula: Initially presumed to be due to possible endocarditis however her right upper extremity, where the AV fistula is, was extremely tender to palpation with increased erythema. Vascular removed the graft yesterday and the catheter was placed. During the surgery they found the graft fully incorporated with pus in the midsection.  ID re-evaluated and recommended a shorter course of antibiotics, 4 weeks of Ancef with dialysis for the bacteremia 2/2 AVF infection.  -Continue Ancef (day 8) for now, scheduled to be given with Hemodialysis -Blood cultures showed NGTD -Follow up of graft culture -OxyIR 5 mg q2hr for pain -PT/OT -Resume HD home schedule   End-stage renal disease on hemodialysis Tuesday Thursday Saturday:  -Today her BUN was 32 creatinine 7.12, potassium 3.9, bicarb 23. -Tuesday, Thursday, Saturday hemodialysis.  Epigastric and right upper quadrant pain:She presented with some abdominal pain, she has no complaints of pain today. -Continue Protonix 40 mg twice daily -MiraLAX as needed  Hypertension:Blood pressure seems well controlled.  -Continue home amlodipine and metoprolol  Hypokalemia:Resolved. Potassium today was 3.9, is getting hemodialysis today.  FEN:No fluids, replete lytes prn,Regulardiet  VTE SVX:[id number] Code Status: FULL   Dispo: Anticipated discharge in approximately 1-2 day(s).  Asencion Noble, MD 03/17/2018, 6:28 AM Pager: [phone]

## 2018-03-17 NOTE — Progress Notes (Signed)
  Progress Note    03/17/2018 8:24 AM 1 Day Post-Op  Subjective: She is feeling okay this morning with mild pain in her right upper extremity  Vitals:   03/17/18 0503 03/17/18 0802  BP:    Pulse:    Resp:    Temp: 97.9 F (36.6 C) 98 F (36.7 C)  SpO2:      Physical Exam: Awake alert oriented and eating breakfast Right upper arm incision is intact with staples Right hand is well-perfused Left chest without hematoma  CBC    Component Value Date/Time   WBC 10.6 (H) 03/17/2018 0729   RBC 3.22 (L) 03/17/2018 0729   HGB 9.8 (L) 03/17/2018 0729   HGB 11.0 (L) 10/15/2016 1211   HCT 29.7 (L) 03/17/2018 0729   HCT 32.1 (L) 10/15/2016 1211   PLT 183 03/17/2018 0729   PLT 179 10/15/2016 1211   MCV 92.2 03/17/2018 0729   MCV 92 10/15/2016 1211   MCH 30.4 03/17/2018 0729   MCHC 33.0 03/17/2018 0729   RDW 14.7 03/17/2018 0729   RDW 15.6 (H) 10/15/2016 1211   LYMPHSABS 0.9 03/12/2018 0333   MONOABS 0.4 03/12/2018 0333   EOSABS 0.1 03/12/2018 0333   BASOSABS 0.0 03/12/2018 0333    BMET    Component Value Date/Time   NA 133 (L) 03/16/2018 0650   NA 134 06/17/2017 1617   K 3.2 (L) 03/16/2018 0650   CL 95 (L) 03/16/2018 0650   CO2 26 03/16/2018 0650   GLUCOSE 95 03/16/2018 0650   BUN 20 03/16/2018 0650   BUN 25 (H) 06/17/2017 1617   CREATININE 5.51 (H) 03/16/2018 0650   CREATININE 3.61 (H) 08/09/2013 1528   CALCIUM 8.4 (L) 03/16/2018 0650   GFRNONAA 8 (L) 03/16/2018 0650   GFRAA 9 (L) 03/16/2018 0650    INR    Component Value Date/Time   INR 0.97 03/10/2018 0509     Intake/Output Summary (Last 24 hours) at 03/17/2018 0824 Last data filed at 03/17/2018 0303 Gross per 24 hour  Intake 938.17 ml  Output 100 ml  Net 838.17 ml     Assessment:  57 y.o. female is s/p placement of tunneled catheter for dialysis access and excision of right upper arm infected AV graft.  Preliminary cultures were gram-positive cocci from the operating room  Plan: Continue  antibiotics follow-up cultures Okay for dry dressing on right upper extremity She appears stable from a vascular standpoint for discharge, can use the catheter for dialysis today and we will have her follow-up in a few weeks for staple removal.   Analeise Mccleery C. Donzetta Matters, MD [facility] Office: [phone] Pager: [phone]  03/17/2018 8:24 AM

## 2018-03-17 NOTE — Progress Notes (Signed)
PT Cancellation Note  Patient Details Name: DULSE RUTAN MRN: [id number] DOB: [birth year]   Cancelled Treatment:    Reason Eval/Treat Not Completed: Pain limiting ability to participate. Attempted PT Rx x 2. On first attempt pt reports having just received pain meds and inability to walk at that time. Upon 2nd attempt, pt sleeping. NT in room and reports pt has been doing well ambulating to/from the bathroom. PT to re-attempt as time allows.   Lorriane Shire 03/17/2018, 1:38 PM

## 2018-03-18 DIAGNOSIS — R2689 Other abnormalities of gait and mobility: Secondary | ICD-10-CM

## 2018-03-18 LAB — CBC
HCT: 27.6 % — ABNORMAL LOW (ref 36.0–46.0)
Hemoglobin: 9.2 g/dL — ABNORMAL LOW (ref 12.0–15.0)
MCH: 30.7 pg (ref 26.0–34.0)
MCHC: 33.3 g/dL (ref 30.0–36.0)
MCV: 92 fL (ref 80.0–100.0)
Platelets: 162 10*3/uL (ref 150–400)
RBC: 3 MIL/uL — ABNORMAL LOW (ref 3.87–5.11)
RDW: 14.9 % (ref 11.5–15.5)
WBC: 6.8 10*3/uL (ref 4.0–10.5)
nRBC: 0 % (ref 0.0–0.2)

## 2018-03-18 LAB — RENAL FUNCTION PANEL
Albumin: 2.2 g/dL — ABNORMAL LOW (ref 3.5–5.0)
Anion gap: 15 (ref 5–15)
BUN: 42 mg/dL — ABNORMAL HIGH (ref 6–20)
CO2: 23 mmol/L (ref 22–32)
Calcium: 8.1 mg/dL — ABNORMAL LOW (ref 8.9–10.3)
Chloride: 93 mmol/L — ABNORMAL LOW (ref 98–111)
Creatinine, Ser: 9.36 mg/dL — ABNORMAL HIGH (ref 0.44–1.00)
GFR calc Af Amer: 5 mL/min — ABNORMAL LOW (ref >60)
GFR calc non Af Amer: 4 mL/min — ABNORMAL LOW (ref >60)
Glucose, Bld: 139 mg/dL — ABNORMAL HIGH (ref 70–99)
Phosphorus: 6.1 mg/dL — ABNORMAL HIGH (ref 2.5–4.6)
Potassium: 3.1 mmol/L — ABNORMAL LOW (ref 3.5–5.1)
Sodium: 131 mmol/L — ABNORMAL LOW (ref 135–145)

## 2018-03-18 LAB — GLUCOSE, CAPILLARY: Glucose-Capillary: 96 mg/dL (ref 70–99)

## 2018-03-18 MED ORDER — CEFAZOLIN IV (FOR PTA / DISCHARGE USE ONLY): 2.0000 g | INTRAVENOUS | 0 refills | Status: AC

## 2018-03-18 MED ORDER — CEFAZOLIN SODIUM-DEXTROSE 2-4 GM/100ML-% IV SOLN
2.0000 g | INTRAVENOUS | Status: DC
  Filled 2018-03-18: qty 100

## 2018-03-18 MED ORDER — DOXERCALCIFEROL 4 MCG/2ML IV SOLN
INTRAVENOUS | Status: AC
  Administered 2018-03-18: 3 ug via INTRAVENOUS
  Filled 2018-03-18: qty 2

## 2018-03-18 MED ORDER — HEPARIN SODIUM (PORCINE) 1000 UNIT/ML IJ SOLN
INTRAMUSCULAR | Status: AC
  Administered 2018-03-18: 4000 [IU]
  Filled 2018-03-18: qty 4

## 2018-03-18 MED ORDER — OXYCODONE-ACETAMINOPHEN 5-325 MG PO TABS: 1.0000 | ORAL_TABLET | Freq: Three times a day (TID) | ORAL | 0 refills | Status: DC | PRN

## 2018-03-18 MED ORDER — OXYCODONE-ACETAMINOPHEN 5-325 MG PO TABS
1.0000 | ORAL_TABLET | Freq: Three times a day (TID) | ORAL | Status: DC | PRN
  Administered 2018-03-18: 1 via ORAL
  Filled 2018-03-18: qty 1

## 2018-03-18 NOTE — Care Management Note (Signed)
Case Management Note  Patient Details  Name: LEVIA WALTERMIRE MRN: [id number] Date of Birth: 1960/10/13  Subjective/Objective:     For dc today, needing HHPT, and rolling walker, NCM offered choice, she chose [facility], referral given to [facility] with [facility], soc will begin 24- 48 hrs post dc.                Action/Plan: DC home when ready.  Expected Discharge Date:  03/18/18               Expected Discharge Plan:  [place]  In-House Referral:     Discharge planning Services     Post Acute Care Choice:  Home Health, Durable Medical Equipment Choice offered to:  Patient  DME Arranged:  Walker rolling DME Agency:  [place]:  PT [facility] Agency:  [place]  Status of Service:  Completed, signed off  If discussed at [place] of Stay Meetings, dates discussed:    Additional Comments:  Zenon Mayo, RN 03/18/2018, 11:20 AM

## 2018-03-18 NOTE — Progress Notes (Addendum)
   Subjective: patient was evaluated this morning on rounds. She denied any issues overnight.  Denies fever/chills.  Reports some mild pain in her right arm but continues to improve.  Objective:  Vital signs in last 24 hours: Vitals:   03/18/18 0357 03/18/18 0540 03/18/18 0757 03/18/18 0805  BP: 126/87  112/74   Pulse:   83   Resp: (!) 24  20   Temp: 97.7 F (36.5 C)  98.1 F (36.7 C) 97.8 F (36.6 C)  TempSrc: Oral  Oral Oral  SpO2:   94%   Weight:  62.4 kg    Height:       Physical Exam  Constitutional: She is well-developed, well-nourished, and in no distress.  Cardiovascular: Normal rate, regular rhythm and normal heart sounds. Exam reveals no gallop and no friction rub.  No murmur heard. Pulmonary/Chest: Effort normal and breath sounds normal. No respiratory distress. She has no wheezes. She has no rales.  Skin:  Right upper extremity with staples in place.  No signs of infection noted. No drainage or erythema from the site.     Assessment/Plan:  Active Problems:   ESRD (end stage renal disease) (HCC)   Sepsis (HCC)   Hyperkalemia   Acute respiratory distress   Staphylococcal sepsis (HCC)   Bacteremia  MSSA bacteremia 2/2 infected AV fistula Right upper arm fistula site removal appears well-healing without signs of infection. Vitals and blood work has been stable.  Patient has not required pain medication for the past 24 hours. She will continue with a 4 week course of ancef during dialysis.  PT recommended home health PT and a rolling walker. -Ancef 4 weeks with dialysis, end date of 04/13/18 - HH PT - Rolling walker  ESRD w/HD tue/thur/sat Patient will have HD today to resume her usual schedule.  Can discharge after HD. -HD today  Hypertension Due to soft blood pressures during admission her blood pressure medications were held and these can be restarted as needed. -restart BP meds when needed   Dispo: Anticipated discharge today.   Addendum:   Discussed disposition with husband today.  He states he does not feel comfortable with patient being discharged to home due to gain instability.  I discussed that patient was evaluated by PT and they recommended home health PT.  I offered for PT to re-evaluate for SNF placement however husband declined stating he wants the patient to come home on Monday when he is off from work.  At this time patient still needs hemodialysis and needs to stay to have session completed.  Will have patient ambulate with nurse in the meantime to help with deconditioning.     Kalman Shan Hockinson, DO 03/18/2018, 10:13 AM Pager: [phone]

## 2018-03-18 NOTE — Progress Notes (Signed)
Internal Medicine Attending:   I saw and examined the patient. I reviewed the resident's note and I agree with the resident's findings and plan as documented in the resident's note.  Patient feels well today with no new complaints.  She states that the pain and swelling in her right arm is improving.  Patient was initially admitted to the hospital with sepsis secondary to MSSA bacteremia from an infected AV graft.  Patient is now status post right AV graft removal and placement of a temporary hemodialysis catheter.  Patient will need to complete a four-week course of Ancef with dialysis (end date 04/13/2018).  PT follow-up recommendations appreciated.  Patient will require home health PT on discharge as well as rolling walker.  Patient is scheduled for hemodialysis today.  Patient is stable for discharge home after hemodialysis.  Case discussed with patient in detail and she is in agreement with plan

## 2018-03-18 NOTE — Progress Notes (Addendum)
CSW spoke with pt's spouse in [place] office concerning pt's discharge.  Pt's spouse would like for [facility] and NCM to contact him at [phone].  Reed Breech LCSWA [phone]

## 2018-03-18 NOTE — Progress Notes (Addendum)
[facility] Progress Note   Subjective:  Seen in room, for HD later today. Says "feels great", looks much improved. Denies CP/dyspnea, still with R arm soreness s/p AVG excision.  Objective Vitals:   03/18/18 0357 03/18/18 0540 03/18/18 0757 03/18/18 0805  BP: 126/87  112/74   Pulse:   83   Resp: (!) 24  20   Temp: 97.7 F (36.5 C)  98.1 F (36.7 C) 97.8 F (36.6 C)  TempSrc: Oral  Oral Oral  SpO2:   94%   Weight:  62.4 kg    Height:       Physical Exam General: Well appearing, NAD Heart:RRR, 3/6 systolic murmur Lungs: CTAB Extremities: No LE edema Dialysis Access: TDC in L chest, stapled incision RUE + edema/soreness  Additional Objective Labs: Basic Metabolic Panel: Recent Labs  Lab 03/12/18 0333  03/15/18 0439 03/16/18 0650 03/17/18 0729  NA 132*   < > 133* 133* 132*  K 5.0   < > 3.9 3.2* 3.9  CL 92*   < > 92* 95* 91*  CO2 21*   < > 23 26 23   GLUCOSE 75   < > 78 95 102*  BUN 57*   < > 49* 20 32*  CREATININE 9.00*   < > 8.55* 5.51* 7.12*  CALCIUM 9.1   < > 9.2 8.4* 8.7*  PHOS 5.5*  --   --   --   --    < > = values in this interval not displayed.   Liver Function Tests: Recent Labs  Lab 03/13/18 1538 03/15/18 0439 03/16/18 0650  AST 144* 59* 51*  ALT 33 9 8  ALKPHOS 131* 124 117  BILITOT 1.1 0.6 0.8  PROT 6.6 6.8 6.2*  ALBUMIN 2.6* 2.4* 2.3*   CBC: Recent Labs  Lab 03/12/18 0333 03/13/18 0447 03/14/18 0455 03/15/18 0439 03/16/18 0650 03/17/18 0729  WBC 5.6 6.8 7.4 9.3 8.0 10.6*  NEUTROABS 4.1  --   --   --   --   --   HGB 10.1* 9.8* 10.2* 10.1* 10.1* 9.8*  HCT 29.8* 29.6* 30.9* 30.3* 29.9* 29.7*  MCV 90.9 90.8 91.2 91.0 91.7 92.2  PLT 89* 98* 103* 141* 173 183   Blood Culture    Component Value Date/Time   SDES ABSCESS RT ARM GRAFT 03/16/2018 1544   SPECREQUEST NONE 03/16/2018 1544   CULT  03/16/2018 1544    RARE STAPHYLOCOCCUS AUREUS SUSCEPTIBILITIES TO FOLLOW Performed at [facility], [place] [address],  [place], [place] [address]    REPTSTATUS PENDING 03/16/2018 1544   Studies/Results: Dg Chest Port 1 View  Result Date: 03/16/2018 CLINICAL DATA:  Dialysis catheter placement. EXAM: PORTABLE CHEST 1 VIEW COMPARISON:  03/11/2018 FINDINGS: A left jugular dialysis catheter has been placed and terminates over the lower SVC/cavoatrial junction. The cardiac silhouette remains mildly enlarged. Aortic atherosclerosis is noted. Lung volumes are lower than on the prior study with minimal atelectasis in the left lung base. Blunting of the right costophrenic angle is unchanged and could reflect a trace pleural effusion. No pneumothorax is identified. No acute osseous abnormality is seen. IMPRESSION: 1. Left jugular dialysis catheter placement as above. No pneumothorax. 2. Hypoinflation with minimal left basilar atelectasis and possible trace right pleural effusion. Electronically Signed   By: Logan Bores M.D.   On: 03/16/2018 19:12   Medications: . sodium chloride 10 mL/hr (03/14/18 1104)  . sodium chloride 10 mL/hr at 03/16/18 1420  .  ceFAZolin (ANCEF) IV     .  Chlorhexidine Gluconate Cloth  6 each Topical Q0600  . doxercalciferol  3 mcg Intravenous Q T,Th,Sa-HD  . heparin  5,000 Units Subcutaneous Q8H  . multivitamin  1 tablet Oral QHS  . pantoprazole  40 mg Oral BID    Dialysis Orders: [facility]TS at [facility] 3h 39min 63.5kg 2/2.5 bath Hep none  - hect 3 ug - mircera 30 last on 10/8  home meds: - amlodipine 10/ metoprolol tartrate 25 bid - prednisone qd/ pantoprazole 20 bid/ nicotine gum/ calcium acetate tid ac - albuterol nebs and ACT prn  Assessment/Plan: 1. MSSA bacteremia: D/t infected AVG. TEE aborted, covering with Cefazolin x 6 weeks. RUE AVG excised 10/17 and TDC placed by Dr. Oneida Alar. 2. L sided abd pain - chronic? Had neg Abd CT on admission 3. ESRD: Usual TTS schedule -- off this week, will get back on usual schedule. Next HD 10/19. 4. Pulm edema-CHF resolved,volume improved, now  under edw, willcont to lower dry wt 5. Hypertension - BPwell controlled.cont meds 6. Anemia of ESRD: Hgb 9.8, ESA last dosed on10/8. No indication for ESA at this time. 7. Metabolic bone disease: Ca/Phos ok. No binders at this time. Restart Hectoral with next HD. 8. Nutrition -Alb 2.3.renal dietw/ fluid restrictions, renavite, Nepro 9. AMS: On admit. Head CTshowed chronic microvascular ischemic changes.Improved.  Veneta Penton, PA-C 03/18/2018, 10:40 AM  [employer] Pager: [phone]  Pt seen, examined and agree w A/P as above.  Kelly Splinter MD [employer] pager [phone]   03/18/2018, 1:38 PM

## 2018-03-19 MED ORDER — CEFAZOLIN SODIUM-DEXTROSE 2-4 GM/100ML-% IV SOLN: 2.0000 g | INTRAVENOUS | 0 refills | Status: AC

## 2018-03-19 NOTE — Progress Notes (Addendum)
[facility] Progress Note   Subjective:  Seen in room. Feeling well, R arm pain is less. Tells me that she is being discharged home this afternoon.  Objective Vitals:   03/19/18 0124 03/19/18 0400 03/19/18 0429 03/19/18 0524  BP: 104/61   102/66  Pulse:    79  Resp: 17 (!) 21  15  Temp:   98.3 F (36.8 C)   TempSrc:   Oral   SpO2:    98%  Weight:      Height:       Physical Exam General: Well appearing, NAD Heart:RRR, 3/6 systolic murmur Lungs: CTAB Extremities: No LE edema Dialysis Access: TDC in L chest, stapled incision RUE . Edema/tenderness less today.  Additional Objective Labs: Basic Metabolic Panel: Recent Labs  Lab 03/16/18 0650 03/17/18 0729 03/18/18 1704  NA 133* 132* 131*  K 3.2* 3.9 3.1*  CL 95* 91* 93*  CO2 26 23 23   GLUCOSE 95 102* 139*  BUN 20 32* 42*  CREATININE 5.51* 7.12* 9.36*  CALCIUM 8.4* 8.7* 8.1*  PHOS  --   --  6.1*   Liver Function Tests: Recent Labs  Lab 03/13/18 1538 03/15/18 0439 03/16/18 0650 03/18/18 1704  AST 144* 59* 51*  --   ALT 33 9 8  --   ALKPHOS 131* 124 117  --   BILITOT 1.1 0.6 0.8  --   PROT 6.6 6.8 6.2*  --   ALBUMIN 2.6* 2.4* 2.3* 2.2*   CBC: Recent Labs  Lab 03/14/18 0455 03/15/18 0439 03/16/18 0650 03/17/18 0729 03/18/18 1704  WBC 7.4 9.3 8.0 10.6* 6.8  HGB 10.2* 10.1* 10.1* 9.8* 9.2*  HCT 30.9* 30.3* 29.9* 29.7* 27.6*  MCV 91.2 91.0 91.7 92.2 92.0  PLT 103* 141* 173 183 162   Blood Culture    Component Value Date/Time   SDES ABSCESS RT ARM GRAFT 03/16/2018 1544   SPECREQUEST NONE 03/16/2018 1544   CULT  03/16/2018 1544    RARE STAPHYLOCOCCUS AUREUS NO ANAEROBES ISOLATED; CULTURE IN PROGRESS FOR 5 DAYS    REPTSTATUS PENDING 03/16/2018 1544   Medications: . sodium chloride 10 mL/hr (03/14/18 1104)  . sodium chloride 10 mL/hr at 03/16/18 1420  .  ceFAZolin (ANCEF) IV     . Chlorhexidine Gluconate Cloth  6 each Topical Q0600  . doxercalciferol  3 mcg Intravenous Q  T,Th,Sa-HD  . heparin  5,000 Units Subcutaneous Q8H  . multivitamin  1 tablet Oral QHS  . pantoprazole  40 mg Oral BID    Dialysis Orders: TTSat [facility] 3h 50min 63.5kg 2/2.5 bath Hep none  - hect 3 ug - mircera 30 last on 10/8  home meds: - amlodipine 10/ metoprolol tartrate 25 bid - prednisone qd/ pantoprazole 20 bid/ nicotine gum/ calcium acetate tid ac - albuterol nebs and ACT prn  Assessment/Plan: 1. MSSA bacteremia: D/t infected AVG. TEE aborted, covering with Cefazolin x 6 weeks. RUE AVG excised 10/17 and TDC placed by Dr. Oneida Alar. 2. L sided abd pain - chronic? Had neg Abd CT on admission 3. ESRD: Usual TTS schedule, next due Tues as outpatient. 4. Pulm edema-CHF resolved,volume improved, now under edw, willcont to lower dry wt 5. Hypertension - BPwell controlled.cont meds 6. Anemiaof ESRD: Hgb 9.8,ESA lastdosed on10/8. No indication for ESA at this time. 7. Metabolic bone disease: Ca/Phos ok. No binders at this time. Restart Hectoral with next HD. 8. Nutrition -Alb 2.3.renal dietw/ fluid restrictions, renavite, Nepro 9. AMS: On admit. Head CTshowed chronic microvascular ischemic  changes.Improved.  Veneta Penton, PA-C 03/19/2018, 11:01 AM  [facility] Pager: [phone]  Pt seen, examined and agree w A/P as above.  Kelly Splinter MD [employer] pager [phone]   03/19/2018, 12:55 PM

## 2018-03-19 NOTE — Plan of Care (Signed)
Nsg Discharge Note  Admit Date:  03/10/2018 Discharge date: 03/19/2018   Jordan Likes Eisner to be D/C'd Home per MD order.  AVS completed.  Copy for chart, and copy for patient signed, and dated. Patient/caregiver able to verbalize understanding.  Discharge Medication: Allergies as of 03/19/2018       Reactions   Cefazolin Other (See Comments)   Severe thrombocytopenia (has tolerated zosyn in the past) ID aware        Medication List     STOP taking these medications    amLODipine 10 MG tablet Commonly known as:  NORVASC   metoprolol tartrate 25 MG tablet Commonly known as:  LOPRESSOR       TAKE these medications    albuterol (2.5 MG/3ML) 0.083% nebulizer solution Commonly known as:  PROVENTIL Take 3 mLs (2.5 mg total) by nebulization every 6 (six) hours as needed for wheezing or shortness of breath.   albuterol 108 (90 Base) MCG/ACT inhaler Commonly known as:  PROVENTIL HFA;VENTOLIN HFA Inhale 2 puffs into the lungs every 6 (six) hours as needed for wheezing or shortness of breath. MUST MAKE APPT FOR FURTHER REFILLS   calcium acetate 667 MG capsule Commonly known as:  PHOSLO Take 1,334 mg by mouth 3 (three) times daily with meals.   ceFAZolin  IVPB Commonly known as:  ANCEF Inject 2 g into the vein Every Tuesday,Thursday,and Saturday with dialysis for 27 days. Indication:  MSSA bacteremia Last Day of Therapy:  04/13/18 Labs - Once weekly:  CBC/D and BMP, Labs - Every other week:  ESR and CRP   ceFAZolin 2-4 GM/100ML-% IVPB Commonly known as:  ANCEF Inject 100 mLs (2 g total) into the vein every Tuesday, Thursday, and Saturday at 6 PM for 11 doses. Start taking on:  03/21/2018   Miconazole Nitrate Applicator 950 & 2 MG-% (9GM) Kit Place 1 Applicatorful vaginally daily.   nicotine polacrilex 4 MG gum Commonly known as:  NICORETTE Take 1 each (4 mg total) by mouth as needed for smoking cessation.   oxyCODONE-acetaminophen 5-325 MG tablet Commonly known  as:  PERCOCET/ROXICET Take 1 tablet by mouth every 8 (eight) hours as needed for moderate pain or severe pain.   pantoprazole 20 MG tablet Commonly known as:  PROTONIX Take 1 tablet (20 mg total) by mouth 2 (two) times daily before a meal.   predniSONE 10 MG tablet Commonly known as:  DELTASONE Take 2 tablets (20 mg total) by mouth 2 (two) times daily with a meal.               Home Infusion Instuctions  (From admission, onward)           Start     Ordered   03/18/18 0000  Home infusion instructions [facility] May follow [place] Dosing Protocol; May administer Cathflo as needed to maintain patency of vascular access device.; Flushing of vascular access device: per [facility] Protocol: 0.9% NaCl pre/post medica...    Question Answer Comment  Instructions May follow [place] Dosing Protocol   Instructions May administer Cathflo as needed to maintain patency of vascular access device.   Instructions Flushing of vascular access device: per [facility] Protocol: 0.9% NaCl pre/post medication administration and prn patency; Heparin 100 u/ml, 82m for implanted ports and Heparin 10u/ml, 587mfor all other central venous catheters.   Instructions May follow [facility] Anaphylaxis Protocol for First Dose Administration in the home: 0.9% NaCl at 25-50 ml/hr to maintain IV access for protocol meds. Epinephrine  0.3 ml IV/IM PRN and Benadryl 25-50 IV/IM PRN s/s of anaphylaxis.   Instructions [facility] Infusion Coordinator (RN) to assist per patient IV care needs in the home PRN.      03/18/18 [place]  (From admission, onward)           Start     Ordered   03/18/18 1039  For home use only DME Walker rolling  Once    Comments:  Rolling walker with 5" wheels  Question:  Patient needs a walker to treat with the following condition  Answer:  Gait instability   03/18/18 1040            Discharge Assessment: Vitals:   03/19/18 0429  03/19/18 0524  BP:  102/66  Pulse:  79  Resp:  15  Temp: 98.3 F (36.8 C)   SpO2:  98%   Skin clean, dry and intact without evidence of skin break down, no evidence of skin tears noted. IV catheter discontinued intact. Site without signs and symptoms of complications - no redness or edema noted at insertion site, patient denies c/o pain - only slight tenderness at site.  Dressing with slight pressure applied.  D/c Instructions-Education: Discharge instructions given to patient/family with verbalized understanding. D/c education completed with patient/family including follow up instructions, medication list, d/c activities limitations if indicated, with other d/c instructions as indicated by MD - patient able to verbalize understanding, all questions fully answered. Patient instructed to return to ED, call 911, or call MD for any changes in condition.  Patient will be escorted via [place], and D/C home via private auto when husband gets off work at [employer], [place] 03/19/2018 12:05 PM

## 2018-03-19 NOTE — Discharge Instructions (Signed)
Karen Morrow,   It has been a pleasure working with you and we are glad you're feeling better. You were hospitalized for an infection of your AV fistula in your right arm that spread into your blood. You will receive antibiotics with your dialysis sessions until 04/13/18  For your AV fistula infection, Dr. Donzetta Matters has removed your fistula which removes the source of the infection.  We have you on a 4 week course of antibiotics, Ancef, we have prescribed antibiotics for you to get while you are at dialysis.   We also were holding your blood pressure medications because your blood pressure was normal.  Please STOP your amlodipine and metoprolol for now and have your blood pressure checked again at your primary care visit to re-assess if these need to be restarted.   Follow up with your primary care provider in 1-2 weeks  If your symptoms worsen or you develop new symptoms, please seek medical help whether it is your primary care provider or emergency department.  If you have any questions about this hospitalization please call [phone].

## 2018-03-19 NOTE — Progress Notes (Addendum)
   Subjective: Karen Morrow is doing well today, no acute events overnight. She reports improvement in the pain and swelling of her right arm. She reports there is pain only when she moves it or if someone touches it. She had hemodialysis yesterday with no issues. She has been working with PT and they recommend home health and a walker. She reported that she is ready to go home today. She finished her dialysis around 10 PM last night. We discussed the plan for today and she is in agreement.    Objective:  Vital signs in last 24 hours: Vitals:   03/19/18 0124 03/19/18 0400 03/19/18 0429 03/19/18 0524  BP: 104/61   102/66  Pulse:    79  Resp: 17 (!) 21  15  Temp:   98.3 F (36.8 C)   TempSrc:   Oral   SpO2:    98%  Weight:      Height:        General: Well nourished, well appearing, NAD  Cardiac: RRR, normal S1, S2, systolic murmur Pulmonary: Lungs CTA bilaterally, no wheezing, rhonchi or rales  Abdomen: Soft, non-tender, +bowel sounds Extremity: Right upper extremity wound with staples in place, no drainage or erythema from the site   Assessment/Plan:  Active Problems:   ESRD (end stage renal disease) (HCC)   Sepsis (HCC)   Hyperkalemia   Acute respiratory distress   Staphylococcal sepsis (HCC)   Bacteremia [name] with history of hypertension, end-stage renal disease( HD on TuThSa),anemia and secondary hyperparathyroidism who presented with fever, generalized weakness, altered mental status after missing a dialysis session. Initially admitted for sepsis and started treatment with broad-spectrum antibiotics. She required some time in the ICU for altered mental status,respiratory distress, and SVT with heart rates in the 180s. [name]  Found to have an infected AVF in her right upper extremity. Vascular removed her AV fistula and catheter was placed for dialysis. Continued on Ancef. She has remained afebrile  with no signs of infection in her arm. She had hemodialysis late last night, around 10 PM. No new issues developed today.    MSSA Bacteremia 2/2 infected AV fistula: Removed on 10/17, site is healing well. She remains afebrile overnight. Has not required any pain pills for 2 days. She is currently on a 4 week dose of Ancef which she will complete outpatient with her hemodialysis. . She is currently stable for discharge. -Continue Ancef x4 weeks at hemodialysis, end date 04/13/18 -PT recommended HH PT and rolling walker, CSW assisted with arrangement  ESRD: Patient received HD via catheter yesterday with no issues. Continue home Tues/Thur/Sat HD schedule when discharged.   HTN: She is on metoprolol and amlodipine at home. These are being held while in the hospital and she has been normotensive. Will hold on discharge and have PCP re-evaluate if these need to be restarted.    FEN: No fluids, replete lytes prn, regular diet  VTE ppx: Heparin Code Status: FULL    Dispo: Stable for discharge today.   Asencion Noble, MD 03/19/2018, 6:39 AM Pager: [phone]

## 2018-03-19 NOTE — Plan of Care (Signed)
  Problem: Activity: Goal: Risk for activity intolerance will decrease Outcome: Progressing   Problem: Elimination: Goal: Will not experience complications related to bowel motility Outcome: Progressing   Problem: Pain Managment: Goal: General experience of comfort will improve Outcome: Progressing   

## 2018-03-21 DIAGNOSIS — A4101 Sepsis due to Methicillin susceptible Staphylococcus aureus: Secondary | ICD-10-CM | POA: Diagnosis not present

## 2018-03-21 DIAGNOSIS — N186 End stage renal disease: Secondary | ICD-10-CM | POA: Diagnosis not present

## 2018-03-21 DIAGNOSIS — Z992 Dependence on renal dialysis: Secondary | ICD-10-CM | POA: Diagnosis not present

## 2018-03-21 DIAGNOSIS — D631 Anemia in chronic kidney disease: Secondary | ICD-10-CM | POA: Diagnosis not present

## 2018-03-21 DIAGNOSIS — K7460 Unspecified cirrhosis of liver: Secondary | ICD-10-CM | POA: Diagnosis not present

## 2018-03-21 DIAGNOSIS — N2581 Secondary hyperparathyroidism of renal origin: Secondary | ICD-10-CM | POA: Diagnosis not present

## 2018-03-21 LAB — AEROBIC/ANAEROBIC CULTURE (SURGICAL/DEEP WOUND)

## 2018-03-21 LAB — AEROBIC/ANAEROBIC CULTURE W GRAM STAIN (SURGICAL/DEEP WOUND)

## 2018-03-23 DIAGNOSIS — A4101 Sepsis due to Methicillin susceptible Staphylococcus aureus: Secondary | ICD-10-CM | POA: Diagnosis not present

## 2018-03-23 DIAGNOSIS — N186 End stage renal disease: Secondary | ICD-10-CM | POA: Diagnosis not present

## 2018-03-23 DIAGNOSIS — Z992 Dependence on renal dialysis: Secondary | ICD-10-CM | POA: Diagnosis not present

## 2018-03-23 DIAGNOSIS — N2581 Secondary hyperparathyroidism of renal origin: Secondary | ICD-10-CM | POA: Diagnosis not present

## 2018-03-23 DIAGNOSIS — D631 Anemia in chronic kidney disease: Secondary | ICD-10-CM | POA: Diagnosis not present

## 2018-03-23 DIAGNOSIS — K7460 Unspecified cirrhosis of liver: Secondary | ICD-10-CM | POA: Diagnosis not present

## 2018-03-25 DIAGNOSIS — K7460 Unspecified cirrhosis of liver: Secondary | ICD-10-CM | POA: Diagnosis not present

## 2018-03-25 DIAGNOSIS — D631 Anemia in chronic kidney disease: Secondary | ICD-10-CM | POA: Diagnosis not present

## 2018-03-25 DIAGNOSIS — N186 End stage renal disease: Secondary | ICD-10-CM | POA: Diagnosis not present

## 2018-03-25 DIAGNOSIS — A4101 Sepsis due to Methicillin susceptible Staphylococcus aureus: Secondary | ICD-10-CM | POA: Diagnosis not present

## 2018-03-25 DIAGNOSIS — Z992 Dependence on renal dialysis: Secondary | ICD-10-CM | POA: Diagnosis not present

## 2018-03-25 DIAGNOSIS — N2581 Secondary hyperparathyroidism of renal origin: Secondary | ICD-10-CM | POA: Diagnosis not present

## 2018-03-27 ENCOUNTER — Telehealth: Payer: Self-pay | Admitting: Internal Medicine

## 2018-03-27 NOTE — Telephone Encounter (Signed)
Karen Morrow called requesting orders for PT 2X a week for 3 weeks. Please f/u

## 2018-03-27 NOTE — Telephone Encounter (Signed)
Returned Museum/gallery conservator from [employer] and gave verbal orders

## 2018-03-28 DIAGNOSIS — A4101 Sepsis due to Methicillin susceptible Staphylococcus aureus: Secondary | ICD-10-CM | POA: Diagnosis not present

## 2018-03-28 DIAGNOSIS — D631 Anemia in chronic kidney disease: Secondary | ICD-10-CM | POA: Diagnosis not present

## 2018-03-28 DIAGNOSIS — Z992 Dependence on renal dialysis: Secondary | ICD-10-CM | POA: Diagnosis not present

## 2018-03-28 DIAGNOSIS — N186 End stage renal disease: Secondary | ICD-10-CM | POA: Diagnosis not present

## 2018-03-28 DIAGNOSIS — K7460 Unspecified cirrhosis of liver: Secondary | ICD-10-CM | POA: Diagnosis not present

## 2018-03-28 DIAGNOSIS — N2581 Secondary hyperparathyroidism of renal origin: Secondary | ICD-10-CM | POA: Diagnosis not present

## 2018-03-30 DIAGNOSIS — K7460 Unspecified cirrhosis of liver: Secondary | ICD-10-CM | POA: Diagnosis not present

## 2018-03-30 DIAGNOSIS — N186 End stage renal disease: Secondary | ICD-10-CM | POA: Diagnosis not present

## 2018-03-30 DIAGNOSIS — A4101 Sepsis due to Methicillin susceptible Staphylococcus aureus: Secondary | ICD-10-CM | POA: Diagnosis not present

## 2018-03-30 DIAGNOSIS — N2581 Secondary hyperparathyroidism of renal origin: Secondary | ICD-10-CM | POA: Diagnosis not present

## 2018-03-30 DIAGNOSIS — D631 Anemia in chronic kidney disease: Secondary | ICD-10-CM | POA: Diagnosis not present

## 2018-03-30 DIAGNOSIS — Z992 Dependence on renal dialysis: Secondary | ICD-10-CM | POA: Diagnosis not present

## 2018-03-31 DIAGNOSIS — I120 Hypertensive chronic kidney disease with stage 5 chronic kidney disease or end stage renal disease: Secondary | ICD-10-CM | POA: Diagnosis not present

## 2018-03-31 DIAGNOSIS — Z992 Dependence on renal dialysis: Secondary | ICD-10-CM | POA: Diagnosis not present

## 2018-03-31 DIAGNOSIS — N186 End stage renal disease: Secondary | ICD-10-CM | POA: Diagnosis not present

## 2018-04-01 DIAGNOSIS — D631 Anemia in chronic kidney disease: Secondary | ICD-10-CM | POA: Diagnosis not present

## 2018-04-01 DIAGNOSIS — Z992 Dependence on renal dialysis: Secondary | ICD-10-CM | POA: Diagnosis not present

## 2018-04-01 DIAGNOSIS — N186 End stage renal disease: Secondary | ICD-10-CM | POA: Diagnosis not present

## 2018-04-01 DIAGNOSIS — N2581 Secondary hyperparathyroidism of renal origin: Secondary | ICD-10-CM | POA: Diagnosis not present

## 2018-04-01 DIAGNOSIS — A4101 Sepsis due to Methicillin susceptible Staphylococcus aureus: Secondary | ICD-10-CM | POA: Diagnosis not present

## 2018-04-04 DIAGNOSIS — Z992 Dependence on renal dialysis: Secondary | ICD-10-CM | POA: Diagnosis not present

## 2018-04-04 DIAGNOSIS — A4101 Sepsis due to Methicillin susceptible Staphylococcus aureus: Secondary | ICD-10-CM | POA: Diagnosis not present

## 2018-04-04 DIAGNOSIS — N186 End stage renal disease: Secondary | ICD-10-CM | POA: Diagnosis not present

## 2018-04-04 DIAGNOSIS — D631 Anemia in chronic kidney disease: Secondary | ICD-10-CM | POA: Diagnosis not present

## 2018-04-04 DIAGNOSIS — N2581 Secondary hyperparathyroidism of renal origin: Secondary | ICD-10-CM | POA: Diagnosis not present

## 2018-04-06 DIAGNOSIS — D631 Anemia in chronic kidney disease: Secondary | ICD-10-CM | POA: Diagnosis not present

## 2018-04-06 DIAGNOSIS — N2581 Secondary hyperparathyroidism of renal origin: Secondary | ICD-10-CM | POA: Diagnosis not present

## 2018-04-06 DIAGNOSIS — A4101 Sepsis due to Methicillin susceptible Staphylococcus aureus: Secondary | ICD-10-CM | POA: Diagnosis not present

## 2018-04-06 DIAGNOSIS — N186 End stage renal disease: Secondary | ICD-10-CM | POA: Diagnosis not present

## 2018-04-06 DIAGNOSIS — Z992 Dependence on renal dialysis: Secondary | ICD-10-CM | POA: Diagnosis not present

## 2018-04-07 ENCOUNTER — Ambulatory Visit (INDEPENDENT_AMBULATORY_CARE_PROVIDER_SITE_OTHER): Payer: Self-pay | Admitting: Family

## 2018-04-07 ENCOUNTER — Encounter: Payer: Self-pay | Admitting: Family

## 2018-04-07 ENCOUNTER — Other Ambulatory Visit: Payer: Self-pay

## 2018-04-07 VITALS — BP 161/117 | HR 98 | Temp 97.1°F | Resp 16 | Ht 67.0 in | Wt 130.0 lb

## 2018-04-07 DIAGNOSIS — N186 End stage renal disease: Secondary | ICD-10-CM

## 2018-04-07 DIAGNOSIS — Z992 Dependence on renal dialysis: Secondary | ICD-10-CM

## 2018-04-07 DIAGNOSIS — Z4802 Encounter for removal of sutures: Secondary | ICD-10-CM

## 2018-04-07 NOTE — Patient Instructions (Signed)
Steps to Quit Smoking Smoking tobacco can be bad for your health. It can also affect almost every organ in your body. Smoking puts you and people around you at risk for many serious long-lasting (chronic) diseases. Quitting smoking is hard, but it is one of the best things that you can do for your health. It is never too late to quit. What are the benefits of quitting smoking? When you quit smoking, you lower your risk for getting serious diseases and conditions. They can include:  Lung cancer or lung disease.  Heart disease.  Stroke.  Heart attack.  Not being able to have children (infertility).  Weak bones (osteoporosis) and broken bones (fractures).  If you have coughing, wheezing, and shortness of breath, those symptoms may get better when you quit. You may also get sick less often. If you are pregnant, quitting smoking can help to lower your chances of having a baby of low birth weight. What can I do to help me quit smoking? Talk with your doctor about what can help you quit smoking. Some things you can do (strategies) include:  Quitting smoking totally, instead of slowly cutting back how much you smoke over a period of time.  Going to in-person counseling. You are more likely to quit if you go to many counseling sessions.  Using resources and support systems, such as: ? Online chats with a counselor. ? Phone quitlines. ? Printed self-help materials. ? Support groups or group counseling. ? Text messaging programs. ? Mobile phone apps or applications.  Taking medicines. Some of these medicines may have nicotine in them. If you are pregnant or breastfeeding, do not take any medicines to quit smoking unless your doctor says it is okay. Talk with your doctor about counseling or other things that can help you.  Talk with your doctor about using more than one strategy at the same time, such as taking medicines while you are also going to in-person counseling. This can help make  quitting easier. What things can I do to make it easier to quit? Quitting smoking might feel very hard at first, but there is a lot that you can do to make it easier. Take these steps:  Talk to your family and friends. Ask them to support and encourage you.  Call phone quitlines, reach out to support groups, or work with a counselor.  Ask people who smoke to not smoke around you.  Avoid places that make you want (trigger) to smoke, such as: ? Bars. ? Parties. ? Smoke-break areas at work.  Spend time with people who do not smoke.  Lower the stress in your life. Stress can make you want to smoke. Try these things to help your stress: ? Getting regular exercise. ? Deep-breathing exercises. ? Yoga. ? Meditating. ? Doing a body scan. To do this, close your eyes, focus on one area of your body at a time from head to toe, and notice which parts of your body are tense. Try to relax the muscles in those areas.  Download or buy apps on your mobile phone or tablet that can help you stick to your quit plan. There are many free apps, such as QuitGuide from the CDC (Centers for Disease Control and Prevention). You can find more support from smokefree.gov and other websites.  This information is not intended to replace advice given to you by your health care provider. Make sure you discuss any questions you have with your health care provider. Document Released: 03/13/2009 Document   Revised: 01/13/2016 Document Reviewed: 10/01/2014 Elsevier Interactive Patient Education  2018 Elsevier Inc.  

## 2018-04-07 NOTE — Progress Notes (Signed)
Follow up for staples removal s/p excision of infected AVG   History of Present Illness  Karen Morrow is a 57 y.o. (05/17/1961) female who is s/p placement of left IJ 19 cm tunneled dialysis catheter and excision of right arm AV graft on 03-16-18 by Dr. Donzetta Matters for esrd, infected right arm avg.  She returns today for staples removal.  She denies fever or chills. Right upper arm incision has healed with no signs of infection. Pt denies any problems with her right upper extremity.  She dialyzes T-TH-S via left side chest TDC.   Pt has not had any HD access in her left arm.  Past Medical History:  Diagnosis Date  . Anginal pain ([place])   . Asthma   . Chronic kidney disease    dialysis 3x wk  . Dyspnea   . Frequent bowel movements   . GERD (gastroesophageal reflux disease)   . Hepatitis C antibody test positive   . Hypertension    "just dx'd today" (11/03/2012)    Social History Social History   Tobacco Use  . Smoking status: Light Tobacco Smoker    Packs/day: 0.50    Years: 20.00    Pack years: 10.00    Types: Cigarettes  . Smokeless tobacco: Never Used  Substance Use Topics  . Alcohol use: Yes    Alcohol/week: 1.0 standard drinks    Types: 1 Glasses of wine per week    Comment: 09/07/16-pt reports less than 1 glass of wine/week. 11/03/2012 "1-2 40oz beers/day maybe"   11/17/15 "I don;t drink beer anymore since Ive been on dialysis". p  . Drug use: Yes    Types: Marijuana    Comment: 11/03/2012 "maybe once or twice/wk" 11/17/15 "I haven't smoked in 3-4 months"    Family History Family History  Problem Relation Age of Onset  . Lupus Mother   . Diabetes Father   . Heart attack Father     Surgical History Past Surgical History:  Procedure Laterality Date  . ABDOMINAL HYSTERECTOMY     "partial" (11/03/2012)  . AV FISTULA PLACEMENT Right 12/03/2013   Procedure: CREATION OF ARTERIOVENOUS (AV) FISTULA RIGHT ARM ;  Surgeon: Rosetta Posner, MD;  Location: [place];  Service:  Vascular;  Laterality: Right;  . [place] REMOVAL Right 03/16/2018   Procedure: REMOVAL OF ARTERIOVENOUS GORETEX GRAFT ([place]) RIGHT ARM;  Surgeon: Waynetta Sandy, MD;  Location: [place];  Service: Vascular;  Laterality: Right;  . BASCILIC VEIN TRANSPOSITION Right 03/06/2014   Procedure: SECOND STAGE BASILIC VEIN TRANSPOSITION;  Surgeon: Rosetta Posner, MD;  [facility];  Service: Vascular;  Laterality: Right;  . ESOPHAGOGASTRODUODENOSCOPY N/A 06/05/2013   Procedure: ESOPHAGOGASTRODUODENOSCOPY (EGD);  Surgeon: Winfield Cunas., MD;  Location: [facility];  Service: Endoscopy;  Laterality: N/A;  . ESOPHAGOGASTRODUODENOSCOPY N/A 09/08/2016   Procedure: ESOPHAGOGASTRODUODENOSCOPY (EGD);  Surgeon: Milus Banister, MD;  Location: [place];  Service: Endoscopy;  Laterality: N/A;  . INSERTION OF DIALYSIS CATHETER Left 03/16/2018   Procedure: PLACEMENT OF TUNNEL DIALYSIS CATHETER;  Surgeon: Waynetta Sandy, MD;  Location: [place];  Service: Vascular;  Laterality: Left;  . REVISON OF ARTERIOVENOUS FISTULA Right 01/28/2017   Procedure: REVISON OF RIGHT ARM ARTERIOVENOUS FISTULA USING 8MMX10CM GORETEX GRAFT;  Surgeon: Angelia Mould, MD;  Location: [place];  Service: Vascular;  Laterality: Right;    Allergies  Allergen Reactions  . Cefazolin Other (See Comments)    Severe thrombocytopenia (has tolerated zosyn in the past)  ID aware    Current Outpatient Medications  Medication Sig Dispense Refill  . albuterol (PROVENTIL HFA;VENTOLIN HFA) 108 (90 Base) MCG/ACT inhaler Inhale 2 puffs into the lungs every 6 (six) hours as needed for wheezing or shortness of breath. MUST MAKE APPT FOR FURTHER REFILLS 18 Inhaler 0  . albuterol (PROVENTIL) (2.5 MG/3ML) 0.083% nebulizer solution Take 3 mLs (2.5 mg total) by nebulization every 6 (six) hours as needed for wheezing or shortness of breath. 150 mL 3  . calcium acetate (PHOSLO) 667 MG capsule Take 1,334 mg by mouth 3 (three) times daily with  meals.     [place] Kitchen ceFAZolin (ANCEF) 2-4 GM/100ML-% IVPB Inject 100 mLs (2 g total) into the vein every Tuesday, Thursday, and Saturday at 6 PM for 11 doses. 1100 mL 0  . ceFAZolin (ANCEF) IVPB Inject 2 g into the vein Every Tuesday,Thursday,and Saturday with dialysis for 27 days. Indication:  MSSA bacteremia Last Day of Therapy:  04/13/18 Labs - Once weekly:  CBC/D and BMP, Labs - Every other week:  ESR and CRP 27 Units 0  . Miconazole Nitrate Applicator (MONISTAT 7 COMBO PACK APP) 100 & 2 MG-% (9GM) KIT Place 1 Applicatorful vaginally daily. 44 g 0  . nicotine polacrilex (NICORETTE) 4 MG gum Take 1 each (4 mg total) by mouth as needed for smoking cessation. 100 tablet 1  . oxyCODONE-acetaminophen (PERCOCET/ROXICET) 5-325 MG tablet Take 1 tablet by mouth every 8 (eight) hours as needed for moderate pain or severe pain. 15 tablet 0  . pantoprazole (PROTONIX) 20 MG tablet Take 1 tablet (20 mg total) by mouth 2 (two) times daily before a meal. 60 tablet 5  . predniSONE (DELTASONE) 10 MG tablet Take 2 tablets (20 mg total) by mouth 2 (two) times daily with a meal. 20 tablet 0  . MICONAZOLE 7 100 MG vaginal suppository INSERT 1 APPLICATION VAGINALLY DAILY  0   No current facility-administered medications for this visit.      REVIEW OF SYSTEMS: see HPI for pertinent positives and negatives    PHYSICAL EXAMINATION:  Vitals:   04/07/18 0919 04/07/18 0923  BP: (!) 170/106 (!) 161/117  Pulse: 98 98  Resp: 16   Temp: (!) 97.1 F (36.2 C)   TempSrc: Oral   SpO2: 99%   Weight: 130 lb (59 kg)   Height: '5\' 7"'  (1.702 m)    Body mass index is 20.36 kg/m.  General: The patient appears her stated age.   HEENT:  No gross abnormalities, multiple facial moles, flesh toned. Pulmonary: Respirations are non-labored. Musculoskeletal: There are no major deformities.   Neurologic: No focal weakness or paresthesias are detected Skin: There are no ulcer or rashes noted. Psychiatric: The patient has normal  affect. Cardiovascular: There is a regular rate and rhythm without significant murmur appreciated.    Medical Decision Making  Karen Morrow is a 57 y.o. female who is s/p placement of left IJ 19 cm tunneled dialysis catheter and excision of right arm AV graft on 03-16-18 by Dr. Donzetta Matters for esrd, infected right arm avg.    All staples removed from right upper arm incision that is healing well, incision edges well proximated, no signs of infection.  Steri strips applied to healing incision; I advised pt that these will peel off in the next couple of weeks, that she may shower.  She currently dialyzes via her left side chest TDC.   Pt had a question as to why a portion of the access, Dr. Donzetta Matters  addressed this, the remaining portion is the AV fistula, the AV graft was infected.   Follow up with left arm vein mapping in 3-4 weeks, see Dr. Donzetta Matters afterward.   Clemon Chambers, RN, MSN, FNP-C [facility] Office: [phone]  04/07/2018, 9:45 AM  Clinic MD: Donzetta Matters

## 2018-04-07 NOTE — Progress Notes (Signed)
Vitals:   04/07/18 0919  BP: (!) 170/106  Pulse: 98  Resp: 16  Temp: (!) 97.1 F (36.2 C)  TempSrc: Oral  SpO2: 99%  Weight: 130 lb (59 kg)  Height: 5\' 7"  (1.702 m)

## 2018-04-08 DIAGNOSIS — N2581 Secondary hyperparathyroidism of renal origin: Secondary | ICD-10-CM | POA: Diagnosis not present

## 2018-04-08 DIAGNOSIS — Z992 Dependence on renal dialysis: Secondary | ICD-10-CM | POA: Diagnosis not present

## 2018-04-08 DIAGNOSIS — N186 End stage renal disease: Secondary | ICD-10-CM | POA: Diagnosis not present

## 2018-04-08 DIAGNOSIS — D631 Anemia in chronic kidney disease: Secondary | ICD-10-CM | POA: Diagnosis not present

## 2018-04-08 DIAGNOSIS — A4101 Sepsis due to Methicillin susceptible Staphylococcus aureus: Secondary | ICD-10-CM | POA: Diagnosis not present

## 2018-04-10 ENCOUNTER — Other Ambulatory Visit: Payer: Self-pay

## 2018-04-10 DIAGNOSIS — Z992 Dependence on renal dialysis: Secondary | ICD-10-CM

## 2018-04-10 DIAGNOSIS — N186 End stage renal disease: Secondary | ICD-10-CM

## 2018-04-11 DIAGNOSIS — N2581 Secondary hyperparathyroidism of renal origin: Secondary | ICD-10-CM | POA: Diagnosis not present

## 2018-04-11 DIAGNOSIS — N186 End stage renal disease: Secondary | ICD-10-CM | POA: Diagnosis not present

## 2018-04-11 DIAGNOSIS — D631 Anemia in chronic kidney disease: Secondary | ICD-10-CM | POA: Diagnosis not present

## 2018-04-11 DIAGNOSIS — Z992 Dependence on renal dialysis: Secondary | ICD-10-CM | POA: Diagnosis not present

## 2018-04-11 DIAGNOSIS — A4101 Sepsis due to Methicillin susceptible Staphylococcus aureus: Secondary | ICD-10-CM | POA: Diagnosis not present

## 2018-04-13 ENCOUNTER — Telehealth: Payer: Self-pay | Admitting: Internal Medicine

## 2018-04-13 DIAGNOSIS — N2581 Secondary hyperparathyroidism of renal origin: Secondary | ICD-10-CM | POA: Diagnosis not present

## 2018-04-13 DIAGNOSIS — A4101 Sepsis due to Methicillin susceptible Staphylococcus aureus: Secondary | ICD-10-CM | POA: Diagnosis not present

## 2018-04-13 DIAGNOSIS — D631 Anemia in chronic kidney disease: Secondary | ICD-10-CM | POA: Diagnosis not present

## 2018-04-13 DIAGNOSIS — Z992 Dependence on renal dialysis: Secondary | ICD-10-CM | POA: Diagnosis not present

## 2018-04-13 DIAGNOSIS — N186 End stage renal disease: Secondary | ICD-10-CM | POA: Diagnosis not present

## 2018-04-13 NOTE — Telephone Encounter (Signed)
Will make pcp aware

## 2018-04-13 NOTE — Telephone Encounter (Signed)
Bethany with physical therapy/advanced therapy called to inform that patient canceled/did not go to their appointment. Please follow up.

## 2018-04-14 MED FILL — PANTOPRAZOLE SOD DR 20 MG T: 20 | 30 days supply | Qty: 60 | Fill #2

## 2018-04-15 DIAGNOSIS — Z992 Dependence on renal dialysis: Secondary | ICD-10-CM | POA: Diagnosis not present

## 2018-04-15 DIAGNOSIS — A4101 Sepsis due to Methicillin susceptible Staphylococcus aureus: Secondary | ICD-10-CM | POA: Diagnosis not present

## 2018-04-15 DIAGNOSIS — N186 End stage renal disease: Secondary | ICD-10-CM | POA: Diagnosis not present

## 2018-04-15 DIAGNOSIS — D631 Anemia in chronic kidney disease: Secondary | ICD-10-CM | POA: Diagnosis not present

## 2018-04-15 DIAGNOSIS — N2581 Secondary hyperparathyroidism of renal origin: Secondary | ICD-10-CM | POA: Diagnosis not present

## 2018-04-18 DIAGNOSIS — A4101 Sepsis due to Methicillin susceptible Staphylococcus aureus: Secondary | ICD-10-CM | POA: Diagnosis not present

## 2018-04-18 DIAGNOSIS — N186 End stage renal disease: Secondary | ICD-10-CM | POA: Diagnosis not present

## 2018-04-18 DIAGNOSIS — N2581 Secondary hyperparathyroidism of renal origin: Secondary | ICD-10-CM | POA: Diagnosis not present

## 2018-04-18 DIAGNOSIS — D631 Anemia in chronic kidney disease: Secondary | ICD-10-CM | POA: Diagnosis not present

## 2018-04-18 DIAGNOSIS — Z992 Dependence on renal dialysis: Secondary | ICD-10-CM | POA: Diagnosis not present

## 2018-04-20 DIAGNOSIS — N2581 Secondary hyperparathyroidism of renal origin: Secondary | ICD-10-CM | POA: Diagnosis not present

## 2018-04-20 DIAGNOSIS — A4101 Sepsis due to Methicillin susceptible Staphylococcus aureus: Secondary | ICD-10-CM | POA: Diagnosis not present

## 2018-04-20 DIAGNOSIS — N186 End stage renal disease: Secondary | ICD-10-CM | POA: Diagnosis not present

## 2018-04-20 DIAGNOSIS — D631 Anemia in chronic kidney disease: Secondary | ICD-10-CM | POA: Diagnosis not present

## 2018-04-20 DIAGNOSIS — Z992 Dependence on renal dialysis: Secondary | ICD-10-CM | POA: Diagnosis not present

## 2018-04-22 DIAGNOSIS — N186 End stage renal disease: Secondary | ICD-10-CM | POA: Diagnosis not present

## 2018-04-22 DIAGNOSIS — N2581 Secondary hyperparathyroidism of renal origin: Secondary | ICD-10-CM | POA: Diagnosis not present

## 2018-04-22 DIAGNOSIS — D631 Anemia in chronic kidney disease: Secondary | ICD-10-CM | POA: Diagnosis not present

## 2018-04-22 DIAGNOSIS — A4101 Sepsis due to Methicillin susceptible Staphylococcus aureus: Secondary | ICD-10-CM | POA: Diagnosis not present

## 2018-04-22 DIAGNOSIS — Z992 Dependence on renal dialysis: Secondary | ICD-10-CM | POA: Diagnosis not present

## 2018-04-24 DIAGNOSIS — N186 End stage renal disease: Secondary | ICD-10-CM | POA: Diagnosis not present

## 2018-04-24 DIAGNOSIS — D631 Anemia in chronic kidney disease: Secondary | ICD-10-CM | POA: Diagnosis not present

## 2018-04-24 DIAGNOSIS — A4101 Sepsis due to Methicillin susceptible Staphylococcus aureus: Secondary | ICD-10-CM | POA: Diagnosis not present

## 2018-04-24 DIAGNOSIS — N2581 Secondary hyperparathyroidism of renal origin: Secondary | ICD-10-CM | POA: Diagnosis not present

## 2018-04-24 DIAGNOSIS — Z992 Dependence on renal dialysis: Secondary | ICD-10-CM | POA: Diagnosis not present

## 2018-04-25 ENCOUNTER — Ambulatory Visit (INDEPENDENT_AMBULATORY_CARE_PROVIDER_SITE_OTHER): Payer: Medicare Other | Admitting: Family

## 2018-04-25 ENCOUNTER — Encounter: Payer: Self-pay | Admitting: Family

## 2018-04-25 VITALS — BP 184/120 | HR 85 | Temp 97.5°F | Wt 135.0 lb

## 2018-04-25 DIAGNOSIS — R7881 Bacteremia: Secondary | ICD-10-CM

## 2018-04-25 DIAGNOSIS — B9561 Methicillin susceptible Staphylococcus aureus infection as the cause of diseases classified elsewhere: Secondary | ICD-10-CM

## 2018-04-25 DIAGNOSIS — T827XXD Infection and inflammatory reaction due to other cardiac and vascular devices, implants and grafts, subsequent encounter: Secondary | ICD-10-CM

## 2018-04-25 DIAGNOSIS — T827XXA Infection and inflammatory reaction due to other cardiac and vascular devices, implants and grafts, initial encounter: Secondary | ICD-10-CM | POA: Insufficient documentation

## 2018-04-25 HISTORY — DX: Bacteremia: R78.81

## 2018-04-25 HISTORY — DX: Methicillin susceptible Staphylococcus aureus infection as the cause of diseases classified elsewhere: B95.61

## 2018-04-25 NOTE — Patient Instructions (Signed)
Nice to see you.  No further antibiotics needed.  Have a great holiday!

## 2018-04-25 NOTE — Assessment & Plan Note (Signed)
Surgical site appears clean and no evidence of infection with adequate healing. Continue wound care as indicated by Vascular Surgery. No further antibiotics are necessary at this time.

## 2018-04-25 NOTE — Assessment & Plan Note (Signed)
Infection cleared after starting antibiotics with likely source being the infected fistula. She has had no further fevers, chills or sweats and continues to progress without complication. No further antibiotics are needed at this time with the completion of 4 weeks of cefazolin.

## 2018-04-25 NOTE — Progress Notes (Signed)
Subjective:    Patient ID: Karen Morrow, female    DOB: Feb 26, 1961, 57 y.o.   MRN: [id number]  Chief Complaint  Patient presents with  . MSSA bacteremia  . AV fistula infection    HPI:  Karen Morrow is a 57 y.o. female who presents today for initial office visit following hospitalization for MSSA bacteremia and AV fistula infection.  Karen Morrow has previous history significant for ESRD on dialysis who was recently admitted to the hospital with weakness and a fever of 101.1. She was found to have MSSA bacteremia which was cleared on cultures on 03/11/18.  Unable to complete TEE to rule out endocarditis, however was found to have an infected fistula that was removed on 03/16/18. She was prescribed 4 weeks of Cefazolin with dialysis following insertion of a temporary dialysis catheter. End date of therapy was set for 04/13/18. All hospital records, labs and imaging were reviewed in detail.  Karen Morrow has completed her 4 weeks of Cefazolin with dialysis and is doing very well. Denies fevers, chills or sweats. Previous fistula site is healing without evidence of infection. Temporary dialysis catheter remains in the left upper chest and without evidence of infection.    Allergies  Allergen Reactions  . Cefazolin Other (See Comments)    Severe thrombocytopenia (has tolerated zosyn in the past) ID aware      Outpatient Medications Prior to Visit  Medication Sig Dispense Refill  . albuterol (PROVENTIL HFA;VENTOLIN HFA) 108 (90 Base) MCG/ACT inhaler Inhale 2 puffs into the lungs every 6 (six) hours as needed for wheezing or shortness of breath. MUST MAKE APPT FOR FURTHER REFILLS 18 Inhaler 0  . albuterol (PROVENTIL) (2.5 MG/3ML) 0.083% nebulizer solution Take 3 mLs (2.5 mg total) by nebulization every 6 (six) hours as needed for wheezing or shortness of breath. 150 mL 3  . calcium acetate (PHOSLO) 667 MG capsule Take 1,334 mg by mouth 3 (three) times daily with meals.     Marland Kitchen  MICONAZOLE 7 100 MG vaginal suppository INSERT 1 APPLICATION VAGINALLY DAILY  0  . Miconazole Nitrate Applicator (MONISTAT 7 COMBO PACK APP) 100 & 2 MG-% (9GM) KIT Place 1 Applicatorful vaginally daily. 44 g 0  . nicotine polacrilex (NICORETTE) 4 MG gum Take 1 each (4 mg total) by mouth as needed for smoking cessation. 100 tablet 1  . oxyCODONE-acetaminophen (PERCOCET/ROXICET) 5-325 MG tablet Take 1 tablet by mouth every 8 (eight) hours as needed for moderate pain or severe pain. 15 tablet 0  . pantoprazole (PROTONIX) 20 MG tablet Take 1 tablet (20 mg total) by mouth 2 (two) times daily before a meal. 60 tablet 5  . predniSONE (DELTASONE) 10 MG tablet Take 2 tablets (20 mg total) by mouth 2 (two) times daily with a meal. 20 tablet 0   No facility-administered medications prior to visit.      Past Medical History:  Diagnosis Date  . Anginal pain ([place])   . Asthma   . Chronic kidney disease    dialysis 3x wk  . Dyspnea   . Frequent bowel movements   . GERD (gastroesophageal reflux disease)   . Hepatitis C antibody test positive   . Hypertension    "just dx'd today" (11/03/2012)      Past Surgical History:  Procedure Laterality Date  . ABDOMINAL HYSTERECTOMY     "partial" (11/03/2012)  . AV FISTULA PLACEMENT Right 12/03/2013   Procedure: CREATION OF ARTERIOVENOUS (AV) FISTULA RIGHT ARM ;  Surgeon: Arvilla Meres  Early, MD;  Location: [facility] OR;  Service: Vascular;  Laterality: Right;  . [place] REMOVAL Right 03/16/2018   Procedure: REMOVAL OF ARTERIOVENOUS GORETEX GRAFT ([place]) RIGHT ARM;  Surgeon: Waynetta Sandy, MD;  Location: [place];  Service: Vascular;  Laterality: Right;  . BASCILIC VEIN TRANSPOSITION Right 03/06/2014   Procedure: SECOND STAGE BASILIC VEIN TRANSPOSITION;  Surgeon: Rosetta Posner, MD;  [facility];  Service: Vascular;  Laterality: Right;  . ESOPHAGOGASTRODUODENOSCOPY N/A 06/05/2013   Procedure: ESOPHAGOGASTRODUODENOSCOPY (EGD);  Surgeon: Winfield Cunas., MD;  Location:  [facility];  Service: Endoscopy;  Laterality: N/A;  . ESOPHAGOGASTRODUODENOSCOPY N/A 09/08/2016   Procedure: ESOPHAGOGASTRODUODENOSCOPY (EGD);  Surgeon: Milus Banister, MD;  Location: [place];  Service: Endoscopy;  Laterality: N/A;  . INSERTION OF DIALYSIS CATHETER Left 03/16/2018   Procedure: PLACEMENT OF TUNNEL DIALYSIS CATHETER;  Surgeon: Waynetta Sandy, MD;  Location: [place];  Service: Vascular;  Laterality: Left;  . REVISON OF ARTERIOVENOUS FISTULA Right 01/28/2017   Procedure: REVISON OF RIGHT ARM ARTERIOVENOUS FISTULA USING 8MMX10CM GORETEX GRAFT;  Surgeon: Angelia Mould, MD;  Location: [facility] OR;  Service: Vascular;  Laterality: Right;      Family History  Problem Relation Age of Onset  . Lupus Mother   . Diabetes Father   . Heart attack Father       Social History   Socioeconomic History  . Marital status: Married    Spouse name: Not on file  . Number of children: Not on file  . Years of education: Not on file  . Highest education level: Not on file  Occupational History  . Not on file  Social Needs  . Financial resource strain: Not on file  . Food insecurity:    Worry: Not on file    Inability: Not on file  . Transportation needs:    Medical: Not on file    Non-medical: Not on file  Tobacco Use  . Smoking status: Light Tobacco Smoker    Packs/day: 0.50    Years: 20.00    Pack years: 10.00    Types: Cigarettes  . Smokeless tobacco: Never Used  Substance and Sexual Activity  . Alcohol use: Yes    Alcohol/week: 1.0 standard drinks    Types: 1 Glasses of wine per week    Comment: 09/07/16-pt reports less than 1 glass of wine/week. 11/03/2012 "1-2 40oz beers/day maybe"   11/17/15 "I don;t drink beer anymore since Ive been on dialysis". p  . Drug use: Yes    Types: Marijuana    Comment: 11/03/2012 "maybe once or twice/wk" 11/17/15 "I haven't smoked in 3-4 months"  . Sexual activity: Not Currently  Lifestyle  . Physical activity:    Days per  week: Not on file    Minutes per session: Not on file  . Stress: Not on file  Relationships  . Social connections:    Talks on phone: Not on file    Gets together: Not on file    Attends religious service: Not on file    Active member of club or organization: Not on file    Attends meetings of clubs or organizations: Not on file    Relationship status: Not on file  . Intimate partner violence:    Fear of current or ex partner: Not on file    Emotionally abused: Not on file    Physically abused: Not on file    Forced sexual activity: Not on file  Other Topics Concern  .  Not on file  Social History Narrative  . Not on file      Review of Systems  Constitutional: Negative for chills and fever.  Respiratory: Negative for chest tightness, shortness of breath and wheezing.   Cardiovascular: Negative for chest pain and leg swelling.  Gastrointestinal: Negative for abdominal pain, constipation, diarrhea and vomiting.       Objective:    BP (!) 184/120   Pulse 85   Temp (!) 97.5 F (36.4 C)   Wt 135 lb (61.2 kg)   BMI 21.14 kg/m  Nursing note and vital signs reviewed.  Physical Exam  Constitutional: She is oriented to person, place, and time. She appears well-developed and well-nourished. No distress.  Seated in the chair; pleasant.    Cardiovascular: Normal rate, regular rhythm and intact distal pulses. Exam reveals no gallop and no friction rub.  Murmur heard. Pulmonary/Chest: Effort normal and breath sounds normal. No respiratory distress. She has no wheezes. She has no rales.  Neurological: She is alert and oriented to person, place, and time.  Skin: Skin is warm and dry.  Fistula site and temporary dialysis catheter appear clean with no evidence of infection.   Psychiatric: She has a normal mood and affect.        Assessment & Plan:   Problem List Items Addressed This Visit      Cardiovascular and Mediastinum   Infection of arteriovenous fistula ([place]) -  Primary    Surgical site appears clean and no evidence of infection with adequate healing. Continue wound care as indicated by Vascular Surgery. No further antibiotics are necessary at this time.         Other   MSSA bacteremia    Infection cleared after starting antibiotics with likely source being the infected fistula. She has had no further fevers, chills or sweats and continues to progress without complication. No further antibiotics are needed at this time with the completion of 4 weeks of cefazolin.           I am having Karen Morrow maintain her calcium acetate, nicotine polacrilex, pantoprazole, albuterol, albuterol, predniSONE, Miconazole Nitrate Applicator, oxyCODONE-acetaminophen, and MICONAZOLE 7.   Follow-up:  As needed.    Terri Piedra, MSN, FNP-C Nurse Practitioner [facility] Office phone: [phone] Pager: [place] number: [phone]

## 2018-04-26 DIAGNOSIS — D631 Anemia in chronic kidney disease: Secondary | ICD-10-CM | POA: Diagnosis not present

## 2018-04-26 DIAGNOSIS — A4101 Sepsis due to Methicillin susceptible Staphylococcus aureus: Secondary | ICD-10-CM | POA: Diagnosis not present

## 2018-04-26 DIAGNOSIS — N2581 Secondary hyperparathyroidism of renal origin: Secondary | ICD-10-CM | POA: Diagnosis not present

## 2018-04-26 DIAGNOSIS — N186 End stage renal disease: Secondary | ICD-10-CM | POA: Diagnosis not present

## 2018-04-26 DIAGNOSIS — Z992 Dependence on renal dialysis: Secondary | ICD-10-CM | POA: Diagnosis not present

## 2018-04-29 ENCOUNTER — Other Ambulatory Visit: Payer: Self-pay | Admitting: Internal Medicine

## 2018-04-29 DIAGNOSIS — R0602 Shortness of breath: Secondary | ICD-10-CM

## 2018-04-29 DIAGNOSIS — N2581 Secondary hyperparathyroidism of renal origin: Secondary | ICD-10-CM | POA: Diagnosis not present

## 2018-04-29 DIAGNOSIS — A4101 Sepsis due to Methicillin susceptible Staphylococcus aureus: Secondary | ICD-10-CM | POA: Diagnosis not present

## 2018-04-29 DIAGNOSIS — Z992 Dependence on renal dialysis: Secondary | ICD-10-CM | POA: Diagnosis not present

## 2018-04-29 DIAGNOSIS — D631 Anemia in chronic kidney disease: Secondary | ICD-10-CM | POA: Diagnosis not present

## 2018-04-29 DIAGNOSIS — N186 End stage renal disease: Secondary | ICD-10-CM | POA: Diagnosis not present

## 2018-04-30 DIAGNOSIS — I120 Hypertensive chronic kidney disease with stage 5 chronic kidney disease or end stage renal disease: Secondary | ICD-10-CM | POA: Diagnosis not present

## 2018-04-30 DIAGNOSIS — Z992 Dependence on renal dialysis: Secondary | ICD-10-CM | POA: Diagnosis not present

## 2018-04-30 DIAGNOSIS — N186 End stage renal disease: Secondary | ICD-10-CM | POA: Diagnosis not present

## 2018-05-02 DIAGNOSIS — N2581 Secondary hyperparathyroidism of renal origin: Secondary | ICD-10-CM | POA: Diagnosis not present

## 2018-05-02 DIAGNOSIS — D689 Coagulation defect, unspecified: Secondary | ICD-10-CM | POA: Diagnosis not present

## 2018-05-02 DIAGNOSIS — D631 Anemia in chronic kidney disease: Secondary | ICD-10-CM | POA: Diagnosis not present

## 2018-05-02 DIAGNOSIS — Z992 Dependence on renal dialysis: Secondary | ICD-10-CM | POA: Diagnosis not present

## 2018-05-02 DIAGNOSIS — N186 End stage renal disease: Secondary | ICD-10-CM | POA: Diagnosis not present

## 2018-05-04 DIAGNOSIS — N186 End stage renal disease: Secondary | ICD-10-CM | POA: Diagnosis not present

## 2018-05-04 DIAGNOSIS — N2581 Secondary hyperparathyroidism of renal origin: Secondary | ICD-10-CM | POA: Diagnosis not present

## 2018-05-04 DIAGNOSIS — Z992 Dependence on renal dialysis: Secondary | ICD-10-CM | POA: Diagnosis not present

## 2018-05-04 DIAGNOSIS — D689 Coagulation defect, unspecified: Secondary | ICD-10-CM | POA: Diagnosis not present

## 2018-05-04 DIAGNOSIS — D631 Anemia in chronic kidney disease: Secondary | ICD-10-CM | POA: Diagnosis not present

## 2018-05-05 ENCOUNTER — Other Ambulatory Visit: Payer: Self-pay

## 2018-05-05 ENCOUNTER — Encounter: Payer: Self-pay | Admitting: Vascular Surgery

## 2018-05-05 ENCOUNTER — Other Ambulatory Visit: Payer: Self-pay | Admitting: *Deleted

## 2018-05-05 ENCOUNTER — Encounter: Payer: Self-pay | Admitting: *Deleted

## 2018-05-05 ENCOUNTER — Ambulatory Visit (INDEPENDENT_AMBULATORY_CARE_PROVIDER_SITE_OTHER): Payer: Medicare Other | Admitting: Vascular Surgery

## 2018-05-05 ENCOUNTER — Ambulatory Visit (HOSPITAL_COMMUNITY)
Admission: RE | Admit: 2018-05-05 | Discharge: 2018-05-05 | Disposition: A | Discharge status: Home / Self Care | Payer: Medicare Other | Source: Ambulatory Visit | Attending: Vascular Surgery | Admitting: Vascular Surgery

## 2018-05-05 VITALS — BP 162/90 | HR 70 | Temp 98.0°F | Resp 20 | Ht 67.0 in | Wt 139.0 lb

## 2018-05-05 DIAGNOSIS — Z992 Dependence on renal dialysis: Secondary | ICD-10-CM | POA: Insufficient documentation

## 2018-05-05 DIAGNOSIS — N186 End stage renal disease: Secondary | ICD-10-CM

## 2018-05-05 NOTE — Progress Notes (Signed)
Patient called and instructed to be at [facility] admitting at 6:30 am for 8:30 surgery.

## 2018-05-05 NOTE — Progress Notes (Signed)
Patient ID: CIARRA BRADDY, female   DOB: 09/13/1960, 57 y.o.   MRN: [id number]  Reason for Consult: Follow-up (4 wk UE vein mapping)   Referred by [name], MD  Subjective:     HPI:  DENVER HARDER is a 57 y.o. female with end-stage renal disease on dialysis Tuesday Thursday Saturday.  Recently had excisional right arm AV graft for infection placement of a catheter which she is now on dialysis 4.  She is left arm dominant has never had left upper extremity access.  She does not have a pacemaker.  Right arm has healed now being considered for permanent access.  Past Medical History:  Diagnosis Date  . Anginal pain ([place])   . Asthma   . Chronic kidney disease    dialysis 3x wk  . Dyspnea   . Frequent bowel movements   . GERD (gastroesophageal reflux disease)   . Hepatitis C antibody test positive   . Hypertension    "just dx'd today" (11/03/2012)   Family History  Problem Relation Age of Onset  . Lupus Mother   . Diabetes Father   . Heart attack Father    Past Surgical History:  Procedure Laterality Date  . ABDOMINAL HYSTERECTOMY     "partial" (11/03/2012)  . AV FISTULA PLACEMENT Right 12/03/2013   Procedure: CREATION OF ARTERIOVENOUS (AV) FISTULA RIGHT ARM ;  Surgeon: Rosetta Posner, MD;  Location: [place];  Service: Vascular;  Laterality: Right;  . [place] REMOVAL Right 03/16/2018   Procedure: REMOVAL OF ARTERIOVENOUS GORETEX GRAFT ([place]) RIGHT ARM;  Surgeon: Waynetta Sandy, MD;  Location: [place];  Service: Vascular;  Laterality: Right;  . BASCILIC VEIN TRANSPOSITION Right 03/06/2014   Procedure: SECOND STAGE BASILIC VEIN TRANSPOSITION;  Surgeon: Rosetta Posner, MD;  Location: [facility] OR;  Service: Vascular;  Laterality: Right;  . ESOPHAGOGASTRODUODENOSCOPY N/A 06/05/2013   Procedure: ESOPHAGOGASTRODUODENOSCOPY (EGD);  Surgeon: Winfield Cunas., MD;  Location: [facility];  Service: Endoscopy;  Laterality: N/A;  . ESOPHAGOGASTRODUODENOSCOPY N/A 09/08/2016   Procedure: ESOPHAGOGASTRODUODENOSCOPY (EGD);  Surgeon: Milus Banister, MD;  Location: [place];  Service: Endoscopy;  Laterality: N/A;  . INSERTION OF DIALYSIS CATHETER Left 03/16/2018   Procedure: PLACEMENT OF TUNNEL DIALYSIS CATHETER;  Surgeon: Waynetta Sandy, MD;  Location: [place];  Service: Vascular;  Laterality: Left;  . REVISON OF ARTERIOVENOUS FISTULA Right 01/28/2017   Procedure: REVISON OF RIGHT ARM ARTERIOVENOUS FISTULA USING 8MMX10CM GORETEX GRAFT;  Surgeon: Angelia Mould, MD;  Location: [facility] OR;  Service: Vascular;  Laterality: Right;    Short Social History:  Social History   Tobacco Use  . Smoking status: Light Tobacco Smoker    Packs/day: 0.50    Years: 20.00    Pack years: 10.00    Types: Cigarettes  . Smokeless tobacco: Never Used  Substance Use Topics  . Alcohol use: Yes    Alcohol/week: 1.0 standard drinks    Types: 1 Glasses of wine per week    Comment: 09/07/16-pt reports less than 1 glass of wine/week. 11/03/2012 "1-2 40oz beers/day maybe"   11/17/15 "I don;t drink beer anymore since Ive been on dialysis". p    Allergies  Allergen Reactions  . Cefazolin Other (See Comments)    Severe thrombocytopenia (has tolerated zosyn in the past) ID aware    Current Outpatient Medications  Medication Sig Dispense Refill  . albuterol (PROVENTIL HFA;VENTOLIN HFA) 108 (90 Base) MCG/ACT inhaler Inhale 2 puffs into the lungs every 6 (  six) hours as needed for wheezing or shortness of breath. MUST MAKE APPT FOR FURTHER REFILLS 18 Inhaler 0  . albuterol (PROVENTIL) (2.5 MG/3ML) 0.083% nebulizer solution Take 3 mLs (2.5 mg total) by nebulization every 6 (six) hours as needed for wheezing or shortness of breath. 150 mL 3  . calcium acetate (PHOSLO) 667 MG capsule Take 1,334 mg by mouth 3 (three) times daily with meals.     Marland Kitchen MICONAZOLE 7 100 MG vaginal suppository INSERT 1 APPLICATION VAGINALLY DAILY  0  . Miconazole Nitrate Applicator (MONISTAT 7 COMBO PACK APP)  100 & 2 MG-% (9GM) KIT Place 1 Applicatorful vaginally daily. 44 g 0  . nicotine polacrilex (NICORETTE) 4 MG gum Take 1 each (4 mg total) by mouth as needed for smoking cessation. 100 tablet 1  . oxyCODONE-acetaminophen (PERCOCET/ROXICET) 5-325 MG tablet Take 1 tablet by mouth every 8 (eight) hours as needed for moderate pain or severe pain. 15 tablet 0  . pantoprazole (PROTONIX) 20 MG tablet Take 1 tablet (20 mg total) by mouth 2 (two) times daily before a meal. 60 tablet 5  . predniSONE (DELTASONE) 10 MG tablet Take 2 tablets (20 mg total) by mouth 2 (two) times daily with a meal. 20 tablet 0   No current facility-administered medications for this visit.     Review of Systems  Respiratory: Positive for wheezing.        Objective:  Objective   Vitals:   05/05/18 0927  BP: (!) 162/90  Pulse: 70  Resp: 20  Temp: 98 F (36.7 C)  SpO2: 95%  Weight: 139 lb (63 kg)  Height: 5' 7" (1.702 m)   Body mass index is 21.77 kg/m.  Physical Exam  Constitutional: She appears well-developed.  Neck:  Left IJ catheter in place without signs of infection or hematoma  Cardiovascular:  Palpable left brachial and radial pulses  Pulmonary/Chest: Effort normal.  Skin: Skin is warm and dry.  Well-healed right arm incision    Data: I have independently interpreted her vein mapping which demonstrates no suitable vein in her left upper extremity for use     Assessment/Plan:     57 year old female recently had a graft excised from her right upper extremity.  She is left-hand dominant has never had left arm access.  She does not have any swelling has never had a pacemaker to suggest any central venous occlusive process.  We will plan left upper extremity AV graft evaluate her for vein with on table ultrasound at the time of surgery given that there is a cephalic vein evident by physical exam but appeared occluded by ultrasound on today's exam.  We will get this plan on a nondialysis day in the near  future.     Waynetta Sandy MD [facility]

## 2018-05-06 DIAGNOSIS — Z992 Dependence on renal dialysis: Secondary | ICD-10-CM | POA: Diagnosis not present

## 2018-05-06 DIAGNOSIS — D631 Anemia in chronic kidney disease: Secondary | ICD-10-CM | POA: Diagnosis not present

## 2018-05-06 DIAGNOSIS — D689 Coagulation defect, unspecified: Secondary | ICD-10-CM | POA: Diagnosis not present

## 2018-05-06 DIAGNOSIS — N186 End stage renal disease: Secondary | ICD-10-CM | POA: Diagnosis not present

## 2018-05-06 DIAGNOSIS — N2581 Secondary hyperparathyroidism of renal origin: Secondary | ICD-10-CM | POA: Diagnosis not present

## 2018-05-08 ENCOUNTER — Other Ambulatory Visit: Payer: Self-pay | Admitting: Physician Assistant

## 2018-05-08 DIAGNOSIS — R0602 Shortness of breath: Secondary | ICD-10-CM

## 2018-05-09 DIAGNOSIS — N2581 Secondary hyperparathyroidism of renal origin: Secondary | ICD-10-CM | POA: Diagnosis not present

## 2018-05-09 DIAGNOSIS — N186 End stage renal disease: Secondary | ICD-10-CM | POA: Diagnosis not present

## 2018-05-09 DIAGNOSIS — D689 Coagulation defect, unspecified: Secondary | ICD-10-CM | POA: Diagnosis not present

## 2018-05-09 DIAGNOSIS — Z992 Dependence on renal dialysis: Secondary | ICD-10-CM | POA: Diagnosis not present

## 2018-05-09 DIAGNOSIS — D631 Anemia in chronic kidney disease: Secondary | ICD-10-CM | POA: Diagnosis not present

## 2018-05-11 DIAGNOSIS — Z992 Dependence on renal dialysis: Secondary | ICD-10-CM | POA: Diagnosis not present

## 2018-05-11 DIAGNOSIS — D689 Coagulation defect, unspecified: Secondary | ICD-10-CM | POA: Diagnosis not present

## 2018-05-11 DIAGNOSIS — D631 Anemia in chronic kidney disease: Secondary | ICD-10-CM | POA: Diagnosis not present

## 2018-05-11 DIAGNOSIS — N186 End stage renal disease: Secondary | ICD-10-CM | POA: Diagnosis not present

## 2018-05-11 DIAGNOSIS — N2581 Secondary hyperparathyroidism of renal origin: Secondary | ICD-10-CM | POA: Diagnosis not present

## 2018-05-12 ENCOUNTER — Encounter (HOSPITAL_COMMUNITY): Payer: Self-pay | Admitting: Emergency Medicine

## 2018-05-12 ENCOUNTER — Ambulatory Visit (INDEPENDENT_AMBULATORY_CARE_PROVIDER_SITE_OTHER)
Admission: EM | Admit: 2018-05-12 | Discharge: 2018-05-12 | Disposition: A | Discharge status: Home / Self Care | Payer: Medicare Other | Source: Home / Self Care | Attending: Emergency Medicine | Admitting: Emergency Medicine

## 2018-05-12 ENCOUNTER — Other Ambulatory Visit: Payer: Self-pay

## 2018-05-12 DIAGNOSIS — R0602 Shortness of breath: Secondary | ICD-10-CM | POA: Diagnosis not present

## 2018-05-12 DIAGNOSIS — R079 Chest pain, unspecified: Secondary | ICD-10-CM | POA: Diagnosis not present

## 2018-05-12 DIAGNOSIS — I161 Hypertensive emergency: Secondary | ICD-10-CM | POA: Diagnosis not present

## 2018-05-12 DIAGNOSIS — I10 Essential (primary) hypertension: Secondary | ICD-10-CM | POA: Diagnosis not present

## 2018-05-12 DIAGNOSIS — R0789 Other chest pain: Secondary | ICD-10-CM | POA: Diagnosis not present

## 2018-05-12 NOTE — ED Triage Notes (Signed)
PT reports asthma exacerbation "for a while". PT got an albuterol inhaler yesterday and has used it every 1-2 hours. She has a nebulizer as well and has used it 3-4 times today.

## 2018-05-12 NOTE — ED Triage Notes (Signed)
PT dialyzes Tu/ Th/ Sat and did a full treatment yesterday.

## 2018-05-12 NOTE — ED Provider Notes (Signed)
[place]    CSN: [id number] Arrival date & time: 05/12/18  1732     History   Chief Complaint Chief Complaint  Patient presents with  . Asthma    HPI Karen Morrow is a 57 y.o. female.   Patient states that she attended dialysis yesterday.  Today she was at home when she developed acute shortness of breath and chest pain.  She states that she felt like she was going to die.  She was taking her inhaler frequently.  She seemed to think that that helped.  Of note, she has had a recent hospitalization for septicemia secondary to infected AV graft.  The history is provided by the patient.  Shortness of Breath  Severity:  Severe Onset quality:  Sudden Timing:  Constant Progression:  Resolved Chronicity:  Recurrent Context comment:  History of asthma. Still smokes. Relieved by:  Inhaler Worsened by:  Nothing Associated symptoms: chest pain   Associated symptoms: no abdominal pain, no cough, no ear pain, no fever, no rash, no sore throat, no sputum production and no vomiting     Past Medical History:  Diagnosis Date  . Anginal pain ([place])   . Asthma   . Chronic kidney disease    dialysis 3x wk  . Dyspnea   . Frequent bowel movements   . GERD (gastroesophageal reflux disease)   . Hepatitis C antibody test positive   . Hypertension    "just dx'd today" (11/03/2012)    Patient Active Problem List   Diagnosis Date Noted  . Infection of arteriovenous fistula ([place]) 04/25/2018  . MSSA bacteremia 04/25/2018  . Bacteremia   . Staphylococcal sepsis ([place])   . Sepsis ([place]) 03/10/2018  . Hyperkalemia   . Acute respiratory distress   . Pulmonary hypertension due to left ventricular diastolic dysfunction ([place]) 11/08/2016  . Chronic cough 10/15/2016  . Acute GI bleeding 09/07/2016  . Gastroesophageal reflux disease 04/16/2016  . Anemia in chronic kidney disease, on chronic dialysis ([place]) 04/16/2016  . Primary insomnia 04/16/2016  . Shortness of breath 04/16/2016   . Pruritus 12/11/2014  . Hordeolum externum (stye) 12/11/2014  . Xanthelasma of right upper eyelid 12/11/2014  . Dermatitis 10/30/2014  . ESRD (end stage renal disease) ([place]) 02/25/2014  . Malnutrition of moderate degree ([place]) 11/28/2013  . HTN (hypertension) 11/26/2013  . Gastric ulcer 06/07/2013  . Chest pain 11/03/2012  . Thrombocytopenia ([place]) 11/03/2012  . Elevated transaminase level 11/03/2012  . 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 Hepatitis C antibody test positive     Past Surgical History:  Procedure Laterality Date  . ABDOMINAL HYSTERECTOMY     "partial" (11/03/2012)  . AV FISTULA PLACEMENT Right 12/03/2013   Procedure: CREATION OF ARTERIOVENOUS (AV) FISTULA RIGHT ARM ;  Surgeon: Rosetta Posner, MD;  Location: [facility];  Service: Vascular;  Laterality: Right;  . [place] REMOVAL Right 03/16/2018   Procedure: REMOVAL OF ARTERIOVENOUS GORETEX GRAFT ([place]) RIGHT ARM;  Surgeon: Waynetta Sandy, MD;  Location: [facility];  Service: Vascular;  Laterality: Right;  . BASCILIC VEIN TRANSPOSITION Right 03/06/2014   Procedure: SECOND STAGE BASILIC VEIN TRANSPOSITION;  Surgeon: Rosetta Posner, MD;  Location: [facility] OR;  Service: Vascular;  Laterality: Right;  . ESOPHAGOGASTRODUODENOSCOPY N/A 06/05/2013   Procedure: ESOPHAGOGASTRODUODENOSCOPY (EGD);  Surgeon: Winfield Cunas., MD;  Location: [facility];  Service: Endoscopy;  Laterality: N/A;  . ESOPHAGOGASTRODUODENOSCOPY N/A 09/08/2016   Procedure: ESOPHAGOGASTRODUODENOSCOPY (EGD);  Surgeon:  Milus Banister, MD;  Location: [facility];  Service: Endoscopy;  Laterality: N/A;  . INSERTION OF DIALYSIS CATHETER Left 03/16/2018   Procedure: PLACEMENT OF TUNNEL DIALYSIS CATHETER;  Surgeon: Waynetta Sandy, MD;  Location: [facility];  Service: Vascular;  Laterality: Left;  . REVISON OF ARTERIOVENOUS FISTULA Right 01/28/2017   Procedure: REVISON OF RIGHT ARM ARTERIOVENOUS FISTULA USING 8MMX10CM  GORETEX GRAFT;  Surgeon: Angelia Mould, MD;  Location: [facility];  Service: Vascular;  Laterality: Right;    OB History   No obstetric history on file.      Home Medications    Prior to Admission medications   Medication Sig Start Date End Date Taking? Authorizing Provider  albuterol (PROVENTIL HFA;VENTOLIN HFA) 108 (90 Base) MCG/ACT inhaler TAKE 2 PUFFS BY MOUTH EVERY 6 HOURS AS NEEDED FOR WHEEZE OR SHORTNESS OF BREATH 05/09/18  Yes [name], MD  albuterol (PROVENTIL) (2.5 MG/3ML) 0.083% nebulizer solution Take 3 mLs (2.5 mg total) by nebulization every 6 (six) hours as needed for wheezing or shortness of breath. 11/24/17  Yes McClung, Angela M, PA-C  calcium acetate (PHOSLO) 667 MG capsule Take 1,334 mg by mouth 3 (three) times daily with meals.    Yes [provider]  oxyCODONE-acetaminophen (PERCOCET/ROXICET) 5-325 MG tablet Take 1 tablet by mouth every 8 (eight) hours as needed for moderate pain or severe pain. 03/18/18  Yes Hoffman, Jessica Ratliff, DO  pantoprazole (PROTONIX) 20 MG tablet Take 1 tablet (20 mg total) by mouth 2 (two) times daily before a meal. 11/23/17  Yes McClung, Angela M, PA-C  MICONAZOLE 7 100 MG vaginal suppository INSERT 1 APPLICATION VAGINALLY DAILY 03/09/18   [provider]  Miconazole Nitrate Applicator (MONISTAT 7 COMBO PACK APP) 100 & 2 MG-% (9GM) KIT Place 1 Applicatorful vaginally daily. 03/09/18   Charlesetta Shanks, MD  nicotine polacrilex (NICORETTE) 4 MG gum Take 1 each (4 mg total) by mouth as needed for smoking cessation. 06/17/17   Alfonse Spruce, FNP  predniSONE (DELTASONE) 10 MG tablet Take 2 tablets (20 mg total) by mouth 2 (two) times daily with a meal. 01/02/18   Veryl Speak, MD    Family History Family History  Problem Relation Age of Onset  . Lupus Mother   . Diabetes Father   . Heart attack Father     Social History Social History   Tobacco Use  . Smoking status: Light Tobacco Smoker     Packs/day: 0.50    Years: 20.00    Pack years: 10.00    Types: Cigarettes  . Smokeless tobacco: Never Used  Substance Use Topics  . Alcohol use: Yes    Alcohol/week: 1.0 standard drinks    Types: 1 Glasses of wine per week    Comment: 09/07/16-pt reports less than 1 glass of wine/week. 11/03/2012 "1-2 40oz beers/day maybe"   11/17/15 "I don;t drink beer anymore since Ive been on dialysis". p  . Drug use: Yes    Types: Marijuana    Comment: 11/03/2012 "maybe once or twice/wk" 11/17/15 "I haven't smoked in 3-4 months"     Allergies   Cefazolin   Review of Systems Review of Systems  Constitutional: Negative for chills and fever.  HENT: Negative for ear pain and sore throat.   Eyes: Negative for pain and visual disturbance.  Respiratory: Positive for shortness of breath. Negative for cough and sputum production.   Cardiovascular: Positive for chest pain. Negative for palpitations.  Gastrointestinal: Negative for abdominal pain and vomiting.  Genitourinary: Negative for dysuria and hematuria.  Musculoskeletal: Negative for arthralgias and back pain.  Skin: Negative for color change and rash.  Neurological: Negative for seizures and syncope.  All other systems reviewed and are negative.    Physical Exam Triage Vital Signs ED Triage Vitals  Enc Vitals Group     BP 05/12/18 1805 (!) 219/125     Pulse Rate 05/12/18 1801 93     Resp 05/12/18 1801 (!) 22     Temp 05/12/18 1801 (!) 97.2 F (36.2 C)     Temp Source 05/12/18 1801 Oral     SpO2 05/12/18 1801 99 %     Weight --      Height --      Head Circumference --      Peak Flow --      Pain Score 05/12/18 1803 7     Pain Loc --      Pain Edu? --      Excl. in [place]? --    No data found.  Updated Vital Signs BP (!) 219/125   Pulse 93   Temp (!) 97.2 F (36.2 C) (Oral)   Resp (!) 22   SpO2 99%   Visual Acuity Right Eye Distance:   Left Eye Distance:   Bilateral Distance:    Right Eye Near:   Left Eye Near:      Bilateral Near:     Physical Exam Constitutional:      Comments: Thin, chronically ill-appearing  HENT:     Head: Normocephalic and atraumatic.     Nose: Nose normal.     Mouth/Throat:     Mouth: Mucous membranes are dry.     Pharynx: No oropharyngeal exudate or posterior oropharyngeal erythema.  Eyes:     Conjunctiva/sclera: Conjunctivae normal.  Cardiovascular:     Rate and Rhythm: Normal rate.     Heart sounds: Murmur present.  Pulmonary:     Effort: Pulmonary effort is normal. No respiratory distress.     Breath sounds: Normal breath sounds. No wheezing or rhonchi.  Musculoskeletal: Normal range of motion.        General: No swelling or tenderness.  Skin:    General: Skin is warm and dry.  Neurological:     General: No focal deficit present.     Mental Status: She is alert.  Psychiatric:        Mood and Affect: Mood normal.        Behavior: Behavior normal.      UC Treatments / Results  Labs (all labs ordered are listed, but only abnormal results are displayed) Labs Reviewed - No data to display  EKG None  Radiology No results found.  Procedures Procedures (including critical care time)  Medications Ordered in UC Medications - No data to display  Initial Impression / Assessment and Plan / UC Course  I have reviewed the triage vital signs and the nursing notes.  Pertinent labs & imaging results that were available during my care of the patient were reviewed by me and considered in my medical decision making (see chart for details).     Patient adamantly refused to go to the emergency department.  I told her that she has high risk for having heart attack, PE, CHF, stroke, or other emergent condition.  She voiced understanding.  Family was present during this decision as well.  She has dialysis tomorrow, and she will have her blood pressure checked again there.  She does have  blood pressure medication at home.  Patient was discharged with recommendation to  go to the emergency department. Final Clinical Impressions(s) / UC Diagnoses   Final diagnoses:  Chest pain, unspecified type  Hypertensive emergency  Shortness of breath     Discharge Instructions     Please go to the emergency department to make sure you are not having a heart attack, blood clot in your lungs, or other emergency.    ED Prescriptions    None     Controlled Substance Prescriptions [place] Controlled Substance Registry consulted? No   Katy Fitch, MD 05/12/18 [phone]

## 2018-05-12 NOTE — Discharge Instructions (Addendum)
Please go to the emergency department to make sure you are not having a heart attack, blood clot in your lungs, or other emergency.

## 2018-05-13 ENCOUNTER — Other Ambulatory Visit: Payer: Self-pay

## 2018-05-13 ENCOUNTER — Emergency Department (HOSPITAL_COMMUNITY): Payer: Medicare Other

## 2018-05-13 ENCOUNTER — Inpatient Hospital Stay (HOSPITAL_COMMUNITY)
Admission: EM | Admit: 2018-05-13 | Discharge: 2018-05-16 | DRG: 304 | Disposition: A | Discharge status: Home / Self Care | Payer: Medicare Other | Attending: Internal Medicine | Admitting: Internal Medicine

## 2018-05-13 DIAGNOSIS — Z79899 Other long term (current) drug therapy: Secondary | ICD-10-CM

## 2018-05-13 DIAGNOSIS — Z833 Family history of diabetes mellitus: Secondary | ICD-10-CM

## 2018-05-13 DIAGNOSIS — R9431 Abnormal electrocardiogram [ECG] [EKG]: Secondary | ICD-10-CM | POA: Diagnosis not present

## 2018-05-13 DIAGNOSIS — Z8249 Family history of ischemic heart disease and other diseases of the circulatory system: Secondary | ICD-10-CM

## 2018-05-13 DIAGNOSIS — I132 Hypertensive heart and chronic kidney disease with heart failure and with stage 5 chronic kidney disease, or end stage renal disease: Secondary | ICD-10-CM | POA: Diagnosis present

## 2018-05-13 DIAGNOSIS — I10 Essential (primary) hypertension: Secondary | ICD-10-CM | POA: Diagnosis present

## 2018-05-13 DIAGNOSIS — J9611 Chronic respiratory failure with hypoxia: Secondary | ICD-10-CM | POA: Diagnosis not present

## 2018-05-13 DIAGNOSIS — I160 Hypertensive urgency: Secondary | ICD-10-CM | POA: Diagnosis not present

## 2018-05-13 DIAGNOSIS — N2581 Secondary hyperparathyroidism of renal origin: Secondary | ICD-10-CM | POA: Diagnosis present

## 2018-05-13 DIAGNOSIS — J810 Acute pulmonary edema: Secondary | ICD-10-CM | POA: Diagnosis not present

## 2018-05-13 DIAGNOSIS — Z90710 Acquired absence of both cervix and uterus: Secondary | ICD-10-CM

## 2018-05-13 DIAGNOSIS — E8889 Other specified metabolic disorders: Secondary | ICD-10-CM | POA: Diagnosis present

## 2018-05-13 DIAGNOSIS — N186 End stage renal disease: Secondary | ICD-10-CM | POA: Diagnosis present

## 2018-05-13 DIAGNOSIS — K219 Gastro-esophageal reflux disease without esophagitis: Secondary | ICD-10-CM | POA: Diagnosis present

## 2018-05-13 DIAGNOSIS — Z881 Allergy status to other antibiotic agents status: Secondary | ICD-10-CM

## 2018-05-13 DIAGNOSIS — Z8679 Personal history of other diseases of the circulatory system: Secondary | ICD-10-CM

## 2018-05-13 DIAGNOSIS — I5033 Acute on chronic diastolic (congestive) heart failure: Secondary | ICD-10-CM | POA: Diagnosis present

## 2018-05-13 DIAGNOSIS — I248 Other forms of acute ischemic heart disease: Secondary | ICD-10-CM | POA: Diagnosis present

## 2018-05-13 DIAGNOSIS — Z992 Dependence on renal dialysis: Secondary | ICD-10-CM | POA: Diagnosis present

## 2018-05-13 DIAGNOSIS — D631 Anemia in chronic kidney disease: Secondary | ICD-10-CM | POA: Diagnosis present

## 2018-05-13 DIAGNOSIS — Z8619 Personal history of other infectious and parasitic diseases: Secondary | ICD-10-CM

## 2018-05-13 DIAGNOSIS — J45909 Unspecified asthma, uncomplicated: Secondary | ICD-10-CM | POA: Diagnosis present

## 2018-05-13 DIAGNOSIS — R0602 Shortness of breath: Secondary | ICD-10-CM | POA: Diagnosis not present

## 2018-05-13 LAB — TROPONIN I
Troponin I: 0.06 ng/mL (ref <0.03)
Troponin I: 0.06 ng/mL (ref <0.03)
Troponin I: 0.06 ng/mL (ref <0.03)
Troponin I: 0.08 ng/mL (ref <0.03)

## 2018-05-13 LAB — LACTIC ACID, PLASMA
Lactic Acid, Venous: 1.1 mmol/L (ref 0.5–1.9)
Lactic Acid, Venous: 0.8 mmol/L (ref 0.5–1.9)

## 2018-05-13 LAB — COMPREHENSIVE METABOLIC PANEL
ALT: 34 U/L (ref 0–44)
AST: 43 U/L — ABNORMAL HIGH (ref 15–41)
Albumin: 3.5 g/dL (ref 3.5–5.0)
Alkaline Phosphatase: 119 U/L (ref 38–126)
Anion gap: 17 — ABNORMAL HIGH (ref 5–15)
BUN: 20 mg/dL (ref 6–20)
CO2: 23 mmol/L (ref 22–32)
Calcium: 9.9 mg/dL (ref 8.9–10.3)
Chloride: 91 mmol/L — ABNORMAL LOW (ref 98–111)
Creatinine, Ser: 5.66 mg/dL — ABNORMAL HIGH (ref 0.44–1.00)
GFR calc Af Amer: 9 mL/min — ABNORMAL LOW (ref >60)
GFR calc non Af Amer: 8 mL/min — ABNORMAL LOW (ref >60)
Glucose, Bld: 83 mg/dL (ref 70–99)
Potassium: 4.2 mmol/L (ref 3.5–5.1)
Sodium: 131 mmol/L — ABNORMAL LOW (ref 135–145)
Total Bilirubin: 1 mg/dL (ref 0.3–1.2)
Total Protein: 7.3 g/dL (ref 6.5–8.1)

## 2018-05-13 LAB — CBC WITH DIFFERENTIAL/PLATELET
Abs Immature Granulocytes: 0.02 10*3/uL (ref 0.00–0.07)
Basophils Absolute: 0.1 10*3/uL (ref 0.0–0.1)
Basophils Relative: 1 %
Eosinophils Absolute: 0.2 10*3/uL (ref 0.0–0.5)
Eosinophils Relative: 3 %
HCT: 32 % — ABNORMAL LOW (ref 36.0–46.0)
Hemoglobin: 10.5 g/dL — ABNORMAL LOW (ref 12.0–15.0)
Immature Granulocytes: 0 %
Lymphocytes Relative: 17 %
Lymphs Abs: 1.1 10*3/uL (ref 0.7–4.0)
MCH: 29.2 pg (ref 26.0–34.0)
MCHC: 32.8 g/dL (ref 30.0–36.0)
MCV: 88.9 fL (ref 80.0–100.0)
Monocytes Absolute: 0.4 10*3/uL (ref 0.1–1.0)
Monocytes Relative: 7 %
Neutro Abs: 4.6 10*3/uL (ref 1.7–7.7)
Neutrophils Relative %: 72 %
Platelets: 139 10*3/uL — ABNORMAL LOW (ref 150–400)
RBC: 3.6 MIL/uL — ABNORMAL LOW (ref 3.87–5.11)
RDW: 13.4 % (ref 11.5–15.5)
WBC: 6.4 10*3/uL (ref 4.0–10.5)
nRBC: 0 % (ref 0.0–0.2)

## 2018-05-13 LAB — BRAIN NATRIURETIC PEPTIDE: B Natriuretic Peptide: >4500 pg/mL — ABNORMAL HIGH (ref 0.0–100.0)

## 2018-05-13 MED ORDER — AMLODIPINE BESYLATE 5 MG PO TABS: 10.0000 mg | ORAL_TABLET | Freq: Every day | ORAL | Status: DC

## 2018-05-13 MED ORDER — ONDANSETRON HCL 4 MG/2ML IJ SOLN: 4.0000 mg | Freq: Four times a day (QID) | INTRAMUSCULAR | Status: DC | PRN

## 2018-05-13 MED ORDER — SODIUM CHLORIDE 0.9 % IV SOLN: 100.0000 mL | INTRAVENOUS | Status: DC | PRN

## 2018-05-13 MED ORDER — SODIUM CHLORIDE 0.9% FLUSH: 3.0000 mL | INTRAVENOUS | Status: DC | PRN

## 2018-05-13 MED ORDER — ALBUTEROL SULFATE (2.5 MG/3ML) 0.083% IN NEBU
INHALATION_SOLUTION | RESPIRATORY_TRACT | Status: AC
  Filled 2018-05-13: qty 3

## 2018-05-13 MED ORDER — NITROGLYCERIN IN D5W 200-5 MCG/ML-% IV SOLN
0.0000 ug/min | INTRAVENOUS | Status: DC
  Administered 2018-05-13: 70 ug/min via INTRAVENOUS
  Administered 2018-05-13: 5 ug/min via INTRAVENOUS
  Administered 2018-05-14: 70 ug/min via INTRAVENOUS
  Filled 2018-05-13 (×3): qty 250

## 2018-05-13 MED ORDER — LABETALOL HCL 200 MG PO TABS
100.0000 mg | ORAL_TABLET | Freq: Two times a day (BID) | ORAL | Status: DC
  Administered 2018-05-13: 100 mg via ORAL
  Filled 2018-05-13: qty 1

## 2018-05-13 MED ORDER — SODIUM CHLORIDE 0.9 % IV SOLN: 250.0000 mL | INTRAVENOUS | Status: DC | PRN

## 2018-05-13 MED ORDER — SODIUM CHLORIDE 0.9% FLUSH: 3.0000 mL | Freq: Two times a day (BID) | INTRAVENOUS | Status: DC

## 2018-05-13 MED ORDER — ALBUTEROL SULFATE (2.5 MG/3ML) 0.083% IN NEBU: 2.5000 mg | INHALATION_SOLUTION | Freq: Four times a day (QID) | RESPIRATORY_TRACT | Status: DC | PRN

## 2018-05-13 MED ORDER — PANTOPRAZOLE SODIUM 20 MG PO TBEC
20.0000 mg | DELAYED_RELEASE_TABLET | Freq: Two times a day (BID) | ORAL | Status: DC
  Administered 2018-05-13 – 2018-05-16 (×6): 20 mg via ORAL
  Filled 2018-05-13 (×6): qty 1

## 2018-05-13 MED ORDER — LABETALOL HCL 5 MG/ML IV SOLN
5.0000 mg | INTRAVENOUS | Status: DC | PRN
  Administered 2018-05-13: 20 mg via INTRAVENOUS
  Administered 2018-05-13: 5 mg via INTRAVENOUS
  Filled 2018-05-13 (×3): qty 4

## 2018-05-13 MED ORDER — HEPARIN SODIUM (PORCINE) 1000 UNIT/ML IJ SOLN
INTRAMUSCULAR | Status: AC
  Administered 2018-05-13: 3000 [IU] via INTRAVENOUS_CENTRAL
  Filled 2018-05-13: qty 3

## 2018-05-13 MED ORDER — HEPARIN SODIUM (PORCINE) 1000 UNIT/ML DIALYSIS: 1000.0000 [IU] | INTRAMUSCULAR | Status: DC | PRN

## 2018-05-13 MED ORDER — HEPARIN SODIUM (PORCINE) 5000 UNIT/ML IJ SOLN
5000.0000 [IU] | Freq: Three times a day (TID) | INTRAMUSCULAR | Status: DC
  Administered 2018-05-13 – 2018-05-16 (×7): 5000 [IU] via SUBCUTANEOUS
  Filled 2018-05-13 (×8): qty 1

## 2018-05-13 MED ORDER — HEPARIN SODIUM (PORCINE) 1000 UNIT/ML DIALYSIS
3000.0000 [IU] | INTRAMUSCULAR | Status: DC
  Administered 2018-05-13 – 2018-05-15 (×2): 3000 [IU] via INTRAVENOUS_CENTRAL

## 2018-05-13 MED ORDER — CHLORHEXIDINE GLUCONATE CLOTH 2 % EX PADS
6.0000 | MEDICATED_PAD | Freq: Every day | CUTANEOUS | Status: DC
  Administered 2018-05-15 – 2018-05-16 (×2): 6 via TOPICAL

## 2018-05-13 MED ORDER — ALBUTEROL SULFATE HFA 108 (90 BASE) MCG/ACT IN AERS: 2.0000 | INHALATION_SPRAY | Freq: Four times a day (QID) | RESPIRATORY_TRACT | Status: DC | PRN

## 2018-05-13 MED ORDER — CALCIUM ACETATE (PHOS BINDER) 667 MG PO CAPS
1334.0000 mg | ORAL_CAPSULE | Freq: Three times a day (TID) | ORAL | Status: DC
  Administered 2018-05-14 – 2018-05-15 (×5): 1334 mg via ORAL
  Filled 2018-05-13 (×5): qty 2

## 2018-05-13 MED ORDER — ALTEPLASE 2 MG IJ SOLR: 2.0000 mg | Freq: Once | INTRAMUSCULAR | Status: DC | PRN

## 2018-05-13 MED ORDER — ALBUTEROL SULFATE (2.5 MG/3ML) 0.083% IN NEBU
5.0000 mg | INHALATION_SOLUTION | Freq: Once | RESPIRATORY_TRACT | Status: AC
  Administered 2018-05-13: 5 mg via RESPIRATORY_TRACT
  Filled 2018-05-13: qty 6

## 2018-05-13 MED ORDER — ACETAMINOPHEN 325 MG PO TABS
650.0000 mg | ORAL_TABLET | ORAL | Status: DC | PRN
  Administered 2018-05-13 – 2018-05-15 (×3): 650 mg via ORAL
  Filled 2018-05-13 (×2): qty 2

## 2018-05-13 MED ORDER — AMLODIPINE BESYLATE 10 MG PO TABS
10.0000 mg | ORAL_TABLET | Freq: Every day | ORAL | Status: DC
  Administered 2018-05-13 – 2018-05-15 (×3): 10 mg via ORAL
  Filled 2018-05-13: qty 1
  Filled 2018-05-13: qty 2
  Filled 2018-05-13: qty 1

## 2018-05-13 NOTE — ED Notes (Signed)
Pt ambulatory to restroom independently. Pt resting in bed at this time.

## 2018-05-13 NOTE — ED Provider Notes (Signed)
Emergency Department Provider Note   I have reviewed the triage vital signs and the nursing notes.   HISTORY  Chief Complaint Shortness of Breath   HPI Karen Morrow is a 57 y.o. female with multiple medical problems as documented below the presents the emergency department today secondary to shortness of breath.  Patient states that this started earlier today and she went to urgent care where they told her to come here but she did not want to because she is get dialysis tomorrow but the shortness of breath is not improved at all.  She states she had asthma when she was a child but does not seem that the inhaler is helping her at all at this point.  She is not had any significant chest pain or lower extremity edema.  She had a cough but is not productive and no recent fevers or other illnesses.  No history of the same.  No abdominal symptoms. No other associated or modifying symptoms.    Past Medical History:  Diagnosis Date  . Anginal pain ([place])   . Asthma   . Chronic kidney disease    dialysis 3x wk  . Dyspnea   . Frequent bowel movements   . GERD (gastroesophageal reflux disease)   . Hepatitis C antibody test positive   . Hypertension    "just dx'd today" (11/03/2012)    Patient Active Problem List   Diagnosis Date Noted  . Hypertensive urgency 05/13/2018  . Infection of arteriovenous fistula ([place]) 04/25/2018  . MSSA bacteremia 04/25/2018  . Bacteremia   . Staphylococcal sepsis ([place])   . Sepsis ([place]) 03/10/2018  . Hyperkalemia   . Acute respiratory distress   . Pulmonary hypertension due to left ventricular diastolic dysfunction ([place]) 11/08/2016  . Chronic cough 10/15/2016  . Acute GI bleeding 09/07/2016  . Gastroesophageal reflux disease 04/16/2016  . Anemia in chronic kidney disease, on chronic dialysis ([place]) 04/16/2016  . Primary insomnia 04/16/2016  . Shortness of breath 04/16/2016  . Pruritus 12/11/2014  . Hordeolum externum (st[place]) 12/11/2014  .  Xanthelasma of right upper eyelid 12/11/2014  . Dermatitis 10/30/2014  . ESRD (end stage renal disease) ([place]) 02/25/2014  . Malnutrition of moderate degree ([place]) 11/28/2013  . HTN (hypertension) 11/26/2013  . Gastric ulcer 06/07/2013  . Chest pain 11/03/2012  . Thrombocytopenia ([place]) 11/03/2012  . Elevated transaminase level 11/03/2012  . 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 Hepatitis C antibody test positive     Past Surgical History:  Procedure Laterality Date  . ABDOMINAL HYSTERECTOMY     "partial" (11/03/2012)  . AV FISTULA PLACEMENT Right 12/03/2013   Procedure: CREATION OF ARTERIOVENOUS (AV) FISTULA RIGHT ARM ;  Surgeon: Rosetta Posner, MD;  Location: [facility];  Service: Vascular;  Laterality: Right;  . [place] REMOVAL Right 03/16/2018   Procedure: REMOVAL OF ARTERIOVENOUS GORETEX GRAFT ([place]) RIGHT ARM;  Surgeon: Waynetta Sandy, MD;  Location: [facility];  Service: Vascular;  Laterality: Right;  . BASCILIC VEIN TRANSPOSITION Right 03/06/2014   Procedure: SECOND STAGE BASILIC VEIN TRANSPOSITION;  Surgeon: Rosetta Posner, MD;  Location: [facility] OR;  Service: Vascular;  Laterality: Right;  . ESOPHAGOGASTRODUODENOSCOPY N/A 06/05/2013   Procedure: ESOPHAGOGASTRODUODENOSCOPY (EGD);  Surgeon: Winfield Cunas., MD;  Location: [facility];  Service: Endoscopy;  Laterality: N/A;  . ESOPHAGOGASTRODUODENOSCOPY N/A 09/08/2016   Procedure: ESOPHAGOGASTRODUODENOSCOPY (EGD);  Surgeon: Milus Banister, MD;  Location: [facility];  Service: Endoscopy;  Laterality:  N/A;  . INSERTION OF DIALYSIS CATHETER Left 03/16/2018   Procedure: PLACEMENT OF TUNNEL DIALYSIS CATHETER;  Surgeon: Waynetta Sandy, MD;  Location: [facility];  Service: Vascular;  Laterality: Left;  . REVISON OF ARTERIOVENOUS FISTULA Right 01/28/2017   Procedure: REVISON OF RIGHT ARM ARTERIOVENOUS FISTULA USING 8MMX10CM GORETEX GRAFT;  Surgeon: Angelia Mould, MD;  Location: [facility];   Service: Vascular;  Laterality: Right;    Current Outpatient Rx  . Order #: [id number] Class: Normal  . Order #: [id number] Class: Normal  . Order #: [id number] Class: Historical Med  . Order #: [id number] Class: Normal    Allergies Cefazolin  Family History  Problem Relation Age of Onset  . Lupus Mother   . Diabetes Father   . Heart attack Father     Social History Social History   Tobacco Use  . Smoking status: Light Tobacco Smoker    Packs/day: 0.50    Years: 20.00    Pack years: 10.00    Types: Cigarettes  . Smokeless tobacco: Never Used  Substance Use Topics  . Alcohol use: Yes    Alcohol/week: 1.0 standard drinks    Types: 1 Glasses of wine per week    Comment: 09/07/16-pt reports less than 1 glass of wine/week. 11/03/2012 "1-2 40oz beers/day maybe"   11/17/15 "I don;t drink beer anymore since Ive been on dialysis". p  . Drug use: Yes    Types: Marijuana    Comment: 11/03/2012 "maybe once or twice/wk" 11/17/15 "I haven't smoked in 3-4 months"    Review of Systems  All other systems negative except as documented in the HPI. All pertinent positives and negatives as reviewed in the HPI. ____________________________________________   PHYSICAL EXAM:  VITAL SIGNS: ED Triage Vitals  Enc Vitals Group     BP 05/13/18 0034 (!) 214/123     Pulse Rate 05/13/18 0034 (!) 102     Resp --      Temp 05/13/18 0034 98.4 F (36.9 C)     Temp Source 05/13/18 0034 Oral     SpO2 05/13/18 0034 97 %     Weight 05/13/18 0034 138 lb (62.6 kg)     Height 05/13/18 0034 5\' 7"  (1.702 m)    Constitutional: Alert and oriented. Well appearing and in no acute distress. Eyes: Conjunctivae are normal. PERRL. EOMI. Head: Atraumatic. Nose: No congestion/rhinnorhea. Mouth/Throat: Mucous membranes are moist.  Oropharynx non-erythematous. Neck: No stridor.  No meningeal signs.   Cardiovascular: Normal rate, regular rhythm. Good peripheral circulation. Grossly normal heart sounds.   Respiratory:  Normal respiratory effort.  No retractions. Lungs CTAB. Gastrointestinal: Soft and nontender. No distention.  Musculoskeletal: No lower extremity tenderness nor edema. No gross deformities of extremities. Neurologic:  Normal speech and language. No gross focal neurologic deficits are appreciated.  Skin:  Skin is warm, dry and intact. No rash noted.  ____________________________________________   LABS (all labs ordered are listed, but only abnormal results are displayed)  Labs Reviewed  CBC WITH DIFFERENTIAL/PLATELET - Abnormal; Notable for the following components:      Result Value   RBC 3.60 (*)    Hemoglobin 10.5 (*)    HCT 32.0 (*)    Platelets 139 (*)    All other components within normal limits  COMPREHENSIVE METABOLIC PANEL - Abnormal; Notable for the following components:   Sodium 131 (*)    Chloride 91 (*)    Creatinine, Ser 5.66 (*)    AST 43 (*)    GFR calc non Af  Amer 8 (*)    GFR calc Af Amer 9 (*)    Anion gap 17 (*)    All other components within normal limits  TROPONIN I - Abnormal; Notable for the following components:   Troponin I 0.06 (*)    All other components within normal limits  CULTURE, BLOOD (ROUTINE X 2)  CULTURE, BLOOD (ROUTINE X 2)  LACTIC ACID, PLASMA  LACTIC ACID, PLASMA  BRAIN NATRIURETIC PEPTIDE  TROPONIN I  TROPONIN I  TROPONIN I   ____________________________________________  EKG   EKG Interpretation  Date/Time:  Saturday May 13 2018 01:02:03 EST Ventricular Rate:  89 PR Interval:  170 QRS Duration: 86 QT Interval:  404 QTC Calculation: 491 R Axis:   -9 Text Interpretation:  Normal sinus rhythm Possible Left atrial enlargement Left ventricular hypertrophy with repolarization abnormality Prolonged QT Abnormal ECG TWI in V4-5 new since october Confirmed by Merrily Pew [id number]) on 05/13/2018 1:51:46 AM       ____________________________________________  RADIOLOGY  Dg Chest 2 View  Result Date: 05/13/2018 CLINICAL  DATA:  Shortness of breath for 2 days, nausea. EXAM: CHEST - 2 VIEW COMPARISON:  Chest radiograph March 16, 2018 FINDINGS: Moderate cardiomegaly. Calcified aortic arch. Pulmonary vascular congestion and mild interstitial prominence. Small RIGHT pleural effusion. Tunneled dialysis catheter via LEFT internal jugular venous approach, distal tips projecting distal superior vena cava. No pneumothorax. Multiple vascular clips RIGHT arm. IMPRESSION: 1. Cardiomegaly and interstitial prominence likely reflecting pulmonary edema. Small RIGHT pleural effusion. 2.  Aortic Atherosclerosis (ICD10-I70.0). Electronically Signed   By: Elon Alas M.D.   On: 05/13/2018 01:36    ____________________________________________   PROCEDURES  Procedure(s) performed:   Procedures  CRITICAL CARE Performed by: Merrily Pew Total critical care time: 35 minutes Critical care time was exclusive of separately billable procedures and treating other patients. Critical care was necessary to treat or prevent imminent or life-threatening deterioration. Critical care was time spent personally by me on the following activities: development of treatment plan with patient and/or surrogate as well as nursing, discussions with consultants, evaluation of patient's response to treatment, examination of patient, obtaining history from patient or surrogate, ordering and performing treatments and interventions, ordering and review of laboratory studies, ordering and review of radiographic studies, pulse oximetry and re-evaluation of patient's condition.  ____________________________________________   INITIAL IMPRESSION / ASSESSMENT AND PLAN / ED COURSE  Likely pulmonary edema causing symptoms. HTN as well. NTG infusion started. Discussed with Dr. Justin Mend who will see in AM for dialysis. Discussed with Dr. Alcario Drought for admission.      Pertinent labs & imaging results that were available during my care of the patient were reviewed by  me and considered in my medical decision making (see chart for details).  ____________________________________________  FINAL CLINICAL IMPRESSION(S) / ED DIAGNOSES  Final diagnoses:  Acute pulmonary edema ([place])  Accelerated hypertension     MEDICATIONS GIVEN DURING THIS VISIT:  Medications  albuterol (PROVENTIL) (2.5 MG/3ML) 0.083% nebulizer solution (  Not Given 05/13/18 0122)  nitroGLYCERIN 50 mg in dextrose 5 % 250 mL (0.2 mg/mL) infusion (65 mcg/min Intravenous Rate/Dose Change 05/13/18 0524)  pantoprazole (PROTONIX) EC tablet 20 mg (has no administration in time range)  albuterol (PROVENTIL) (2.5 MG/3ML) 0.083% nebulizer solution 2.5 mg (has no administration in time range)  calcium acetate (PHOSLO) capsule 1,334 mg (has no administration in time range)  labetalol (NORMODYNE,TRANDATE) injection 5-20 mg (5 mg Intravenous Given 05/13/18 0534)  sodium chloride flush (NS) 0.9 % injection 3 mL (  has no administration in time range)  sodium chloride flush (NS) 0.9 % injection 3 mL (has no administration in time range)  0.9 %  sodium chloride infusion (has no administration in time range)  acetaminophen (TYLENOL) tablet 650 mg (has no administration in time range)  ondansetron (ZOFRAN) injection 4 mg (has no administration in time range)  heparin injection 5,000 Units (has no administration in time range)  albuterol (PROVENTIL) (2.5 MG/3ML) 0.083% nebulizer solution 5 mg (5 mg Nebulization Given 05/13/18 0100)     NEW OUTPATIENT MEDICATIONS STARTED DURING THIS VISIT:  Current Discharge Medication List      Note:  This note was prepared with assistance of Dragon voice recognition software. Occasional wrong-word or sound-a-like substitutions may have occurred due to the inherent limitations of voice recognition software.   Jeury Mcnab, Corene Cornea, MD 05/13/18 [phone]

## 2018-05-13 NOTE — Progress Notes (Signed)
CRITICAL VALUE ALERT  Critical Value:  0.06 Troponin  Date & Time Notied: 4481 05/13/18  Provider Notified: Yes  Orders Received/Actions taken: None. Will continue to monitor

## 2018-05-13 NOTE — ED Notes (Signed)
Patient transported to X-ray 

## 2018-05-13 NOTE — H&P (Signed)
History and Physical    Karen Morrow:[id number] DOB: April 25, 1961 DOA: 05/13/2018  PCP: [name], MD  Patient coming from: Home  I have personally briefly reviewed patient's old medical records in [place]  Chief Complaint: SOB  HPI: Karen Morrow is a 57 y.o. female with medical history significant of ESRD on dialysis, HTN.  Patient with recent admit in Oct for Sepsis with MSSA bacteremia ultimately found to be due to infected graft.  Had to abort an attempted TEE at that time to r/o endocarditis though she completed ABx therapy in Nov and no infectious symptoms since then.  It is unclear if patient is still taking amlodipine and metoprolol after that admit (she says she is on "2 medications still for blood pressure").  Regardless her BP on 11/26 at the ID visit was 184/120, her BP at the 12/6 vasc surg office visit was 162/90.  Patient went to dialysis yesterday, had 2L off.  Presents to the ED today with SOB.  SOB not helped by inhalers.  Doesn't usually have to use albuterol inhalers, had asthma as child though.  Has non-productive cough.  No fever, no chills.  No history of same.   ED Course: CXR shows pulm edema.  BP is running as high as 219/125!  EKG shows LVH with TWI in lateral leads (the latter of which is new).  Trop 0.06.  Started on NTG GTT, nephrology called for dialysis in AM and hospitalist asked to admit.   Review of Systems: As per HPI otherwise 10 point review of systems negative.   Past Medical History:  Diagnosis Date  . Anginal pain ([place])   . Asthma   . Chronic kidney disease    dialysis 3x wk  . Dyspnea   . Frequent bowel movements   . GERD (gastroesophageal reflux disease)   . Hepatitis C antibody test positive   . Hypertension    "just dx'd today" (11/03/2012)    Past Surgical History:  Procedure Laterality Date  . ABDOMINAL HYSTERECTOMY     "partial" (11/03/2012)  . AV FISTULA PLACEMENT Right 12/03/2013   Procedure: CREATION  OF ARTERIOVENOUS (AV) FISTULA RIGHT ARM ;  Surgeon: Rosetta Posner, MD;  Location: [facility];  Service: Vascular;  Laterality: Right;  . [place] REMOVAL Right 03/16/2018   Procedure: REMOVAL OF ARTERIOVENOUS GORETEX GRAFT ([place]) RIGHT ARM;  Surgeon: Waynetta Sandy, MD;  Location: [facility];  Service: Vascular;  Laterality: Right;  . BASCILIC VEIN TRANSPOSITION Right 03/06/2014   Procedure: SECOND STAGE BASILIC VEIN TRANSPOSITION;  Surgeon: Rosetta Posner, MD;  Location: [facility] OR;  Service: Vascular;  Laterality: Right;  . ESOPHAGOGASTRODUODENOSCOPY N/A 06/05/2013   Procedure: ESOPHAGOGASTRODUODENOSCOPY (EGD);  Surgeon: Winfield Cunas., MD;  Location: [facility];  Service: Endoscopy;  Laterality: N/A;  . ESOPHAGOGASTRODUODENOSCOPY N/A 09/08/2016   Procedure: ESOPHAGOGASTRODUODENOSCOPY (EGD);  Surgeon: Milus Banister, MD;  Location: [facility];  Service: Endoscopy;  Laterality: N/A;  . INSERTION OF DIALYSIS CATHETER Left 03/16/2018   Procedure: PLACEMENT OF TUNNEL DIALYSIS CATHETER;  Surgeon: Waynetta Sandy, MD;  Location: [facility];  Service: Vascular;  Laterality: Left;  . REVISON OF ARTERIOVENOUS FISTULA Right 01/28/2017   Procedure: REVISON OF RIGHT ARM ARTERIOVENOUS FISTULA USING 8MMX10CM GORETEX GRAFT;  Surgeon: Angelia Mould, MD;  Location: [facility];  Service: Vascular;  Laterality: Right;     reports that she has been smoking cigarettes. She has a 10.00 pack-year smoking history. She has never used smokeless  tobacco. She reports current alcohol use of about 1.0 standard drinks of alcohol per week. She reports current drug use. Drug: Marijuana.  Allergies  Allergen Reactions  . Cefazolin Other (See Comments)    Severe thrombocytopenia (has tolerated zosyn in the past) ID aware    Family History  Problem Relation Age of Onset  . Lupus Mother   . Diabetes Father   . Heart attack Father      Prior to Admission medications   Medication Sig Start Date End Date Taking?  Authorizing Provider  albuterol (PROVENTIL HFA;VENTOLIN HFA) 108 (90 Base) MCG/ACT inhaler TAKE 2 PUFFS BY MOUTH EVERY 6 HOURS AS NEEDED FOR WHEEZE OR SHORTNESS OF BREATH Patient taking differently: Inhale 2 puffs into the lungs every 6 (six) hours as needed for wheezing.  05/09/18  Yes [name], MD  albuterol (PROVENTIL) (2.5 MG/3ML) 0.083% nebulizer solution Take 3 mLs (2.5 mg total) by nebulization every 6 (six) hours as needed for wheezing or shortness of breath. 11/24/17  Yes McClung, Angela M, PA-C  calcium acetate (PHOSLO) 667 MG capsule Take 1,334 mg by mouth 3 (three) times daily with meals.    Yes [provider]  pantoprazole (PROTONIX) 20 MG tablet Take 1 tablet (20 mg total) by mouth 2 (two) times daily before a meal. 11/23/17  Yes Argentina Donovan, [place]    Physical Exam: Vitals:   05/13/18 0515 05/13/18 0520 05/13/18 0523 05/13/18 0525  BP: (!) 204/118 (!) 194/95 (!) 190/114 (!) 193/113  Pulse: 97 (!) 101 (!) 102 98  Resp:      Temp:      TempSrc:      SpO2: 100% 99% 97% 97%  Weight:      Height:        Constitutional: NAD, calm, comfortable Eyes: PERRL, lids and conjunctivae normal ENMT: Mucous membranes are moist. Posterior pharynx clear of any exudate or lesions.Normal dentition.  Neck: normal, supple, no masses, no thyromegaly Respiratory: clear to auscultation bilaterally, no wheezing, no crackles. Normal respiratory effort. No accessory muscle use.  Cardiovascular: Regular rate and rhythm, no murmurs / rubs / gallops. No extremity edema. 2+ pedal pulses. No carotid bruits.  Abdomen: no tenderness, no masses palpated. No hepatosplenomegaly. Bowel sounds positive.  Musculoskeletal: no clubbing / cyanosis. No joint deformity upper and lower extremities. Good ROM, no contractures. Normal muscle tone.  Skin: no rashes, lesions, ulcers. No induration Neurologic: CN 2-12 grossly intact. Sensation intact, DTR normal. Strength 5/5 in all 4.  Psychiatric:  Normal judgment and insight. Alert and oriented x 3. Normal mood.    Labs on Admission: I have personally reviewed following labs and imaging studies  CBC: Recent Labs  Lab 05/13/18 0046  WBC 6.4  NEUTROABS 4.6  HGB 10.5*  HCT 32.0*  MCV 88.9  PLT 657*   Basic Metabolic Panel: Recent Labs  Lab 05/13/18 0046  NA 131*  K 4.2  CL 91*  CO2 23  GLUCOSE 83  BUN 20  CREATININE 5.66*  CALCIUM 9.9   GFR: Estimated Creatinine Clearance: 10.5 mL/min (A) (by C-G formula based on SCr of 5.66 mg/dL (H)). Liver Function Tests: Recent Labs  Lab 05/13/18 0046  AST 43*  ALT 34  ALKPHOS 119  BILITOT 1.0  PROT 7.3  ALBUMIN 3.5   No results for input(s): LIPASE, AMYLASE in the last 168 hours. No results for input(s): AMMONIA in the last 168 hours. Coagulation Profile: No results for input(s): INR, PROTIME in the last 168 hours.  Cardiac Enzymes: Recent Labs  Lab 05/13/18 0046  TROPONINI 0.06*   BNP (last 3 results) No results for input(s): PROBNP in the last 8760 hours. HbA1C: No results for input(s): HGBA1C in the last 72 hours. CBG: No results for input(s): GLUCAP in the last 168 hours. Lipid Profile: No results for input(s): CHOL, HDL, LDLCALC, TRIG, CHOLHDL, LDLDIRECT in the last 72 hours. Thyroid Function Tests: No results for input(s): TSH, T4TOTAL, FREET4, T3FREE, THYROIDAB in the last 72 hours. Anemia Panel: No results for input(s): VITAMINB12, FOLATE, FERRITIN, TIBC, IRON, RETICCTPCT in the last 72 hours. Urine analysis:    Component Value Date/Time   COLORURINE YELLOW 11/26/2013 1819   APPEARANCEUR TURBID (A) 11/26/2013 1819   LABSPEC 1.021 11/26/2013 1819   PHURINE 5.5 11/26/2013 1819   GLUCOSEU NEGATIVE 11/26/2013 1819   HGBUR SMALL (A) 11/26/2013 1819   BILIRUBINUR NEGATIVE 11/26/2013 1819   KETONESUR NEGATIVE 11/26/2013 1819   PROTEINUR >300 (A) 11/26/2013 1819   UROBILINOGEN 0.2 11/26/2013 1819   NITRITE POSITIVE (A) 11/26/2013 1819    LEUKOCYTESUR MODERATE (A) 11/26/2013 1819    Radiological Exams on Admission: Dg Chest 2 View  Result Date: 05/13/2018 CLINICAL DATA:  Shortness of breath for 2 days, nausea. EXAM: CHEST - 2 VIEW COMPARISON:  Chest radiograph March 16, 2018 FINDINGS: Moderate cardiomegaly. Calcified aortic arch. Pulmonary vascular congestion and mild interstitial prominence. Small RIGHT pleural effusion. Tunneled dialysis catheter via LEFT internal jugular venous approach, distal tips projecting distal superior vena cava. No pneumothorax. Multiple vascular clips RIGHT arm. IMPRESSION: 1. Cardiomegaly and interstitial prominence likely reflecting pulmonary edema. Small RIGHT pleural effusion. 2.  Aortic Atherosclerosis (ICD10-I70.0). Electronically Signed   By: Elon Alas M.D.   On: 05/13/2018 01:36    EKG: Independently reviewed.  Assessment/Plan Principal Problem:   Hypertensive urgency Active Problems:   HTN (hypertension)   ESRD (end stage renal disease) ([place])    1. HTN urgency - 1. Either needs more fluid off with dialysis, needs more BP meds, or both. 2. NTG gtt 3. Adding PRN labetalol 4. Will put her back on amlodipine 10 that she used to be on but seems to have disappeared from her med list following October admit. 5. Tele monitor 6. Holding off on ordering repeat 2d echo for the moment since she just had one in October 2. ESRD - 1. Dr. Justin Mend to arrange for dialysis this AM, unclear how much fluid overload contributing vs just uncontrolled HTN.  Did have 2L off at dialysis during a full 4 hour session she says.  DVT prophylaxis: Heparin [place] Code Status: Full Family Communication: No family in room Disposition Plan: Home after admit Consults called: Nephrology Dr. Justin Mend Admission status: Place in [place], [facility] Pager [phone] Only works nights!  If 7AM-7PM, please contact the primary day team physician taking care of  patient  [email or web address] Password [facility]  05/13/2018, 5:45 AM

## 2018-05-13 NOTE — ED Notes (Signed)
Ordered bfast tray 

## 2018-05-13 NOTE — Consult Note (Addendum)
[facility] Renal Consultation Note  Indication for Consultation:  Management of ESRD/hemodialysis; anemia, hypertension/volume and secondary hyperparathyroidism  HPI: Karen Morrow is a 57 y.o. female ho  ESRD on dialysis ,Chronic HD 4 yr (TTS [facility]) . Compliant with op HD. Presented to the ED  with SOB.   CXR = pulm edema.  BP high @ 219/125. Trop 0.06 , K4.2  was Started on NTG GTT,and we were called for dialysis . She reports progressive SOB, nonproductive cough (inhaler did not help ) no CP , fevers, chills,or  N/V (did has some recent diarrhea). Note at op kidney center leaving below her EDW and tolerated .    Noted ho MSSA Bacteremia with AVF infect. removed 03/16/18  And finished Ancef 04/13/18  Plans for new [place] 05/17/18     Past Medical History:  Diagnosis Date  . Anginal pain ([place])   . Asthma   . Chronic kidney disease    dialysis 3x wk  . Dyspnea   . Frequent bowel movements   . GERD (gastroesophageal reflux disease)   . Hepatitis C antibody test positive   . Hypertension    "just dx'd today" (11/03/2012)    Past Surgical History:  Procedure Laterality Date  . ABDOMINAL HYSTERECTOMY     "partial" (11/03/2012)  . AV FISTULA PLACEMENT Right 12/03/2013   Procedure: CREATION OF ARTERIOVENOUS (AV) FISTULA RIGHT ARM ;  Surgeon: Rosetta Posner, MD;  Location: [facility];  Service: Vascular;  Laterality: Right;  . [place] REMOVAL Right 03/16/2018   Procedure: REMOVAL OF ARTERIOVENOUS GORETEX GRAFT ([place]) RIGHT ARM;  Surgeon: Waynetta Sandy, MD;  Location: [facility];  Service: Vascular;  Laterality: Right;  . BASCILIC VEIN TRANSPOSITION Right 03/06/2014   Procedure: SECOND STAGE BASILIC VEIN TRANSPOSITION;  Surgeon: Rosetta Posner, MD;  [facility];  Service: Vascular;  Laterality: Right;  . ESOPHAGOGASTRODUODENOSCOPY N/A 06/05/2013   Procedure: ESOPHAGOGASTRODUODENOSCOPY (EGD);  Surgeon: Winfield Cunas., MD;  Location: [facility];  Service: Endoscopy;  Laterality:  N/A;  . ESOPHAGOGASTRODUODENOSCOPY N/A 09/08/2016   Procedure: ESOPHAGOGASTRODUODENOSCOPY (EGD);  Surgeon: Milus Banister, MD;  Location: [facility];  Service: Endoscopy;  Laterality: N/A;  . INSERTION OF DIALYSIS CATHETER Left 03/16/2018   Procedure: PLACEMENT OF TUNNEL DIALYSIS CATHETER;  Surgeon: Waynetta Sandy, MD;  Location: [facility];  Service: Vascular;  Laterality: Left;  . REVISON OF ARTERIOVENOUS FISTULA Right 01/28/2017   Procedure: REVISON OF RIGHT ARM ARTERIOVENOUS FISTULA USING 8MMX10CM GORETEX GRAFT;  Surgeon: Angelia Mould, MD;  Location: [facility] OR;  Service: Vascular;  Laterality: Right;      Family History  Problem Relation Age of Onset  . Lupus Mother   . Diabetes Father   . Heart attack Father       reports that she has been smoking cigarettes. She has a 10.00 pack-year smoking history. She has never used smokeless tobacco. She reports current alcohol use of about 1.0 standard drinks of alcohol per week. She reports current drug use. Drug: Marijuana.   Allergies  Allergen Reactions  . Cefazolin Other (See Comments)    Severe thrombocytopenia (has tolerated zosyn in the past) ID aware    Prior to Admission medications   Medication Sig Start Date End Date Taking? Authorizing Provider  albuterol (PROVENTIL HFA;VENTOLIN HFA) 108 (90 Base) MCG/ACT inhaler TAKE 2 PUFFS BY MOUTH EVERY 6 HOURS AS NEEDED FOR WHEEZE OR SHORTNESS OF BREATH Patient taking differently: Inhale 2 puffs into the lungs every 6 (six)  hours as needed for wheezing.  05/09/18  Yes [name], MD  albuterol (PROVENTIL) (2.5 MG/3ML) 0.083% nebulizer solution Take 3 mLs (2.5 mg total) by nebulization every 6 (six) hours as needed for wheezing or shortness of breath. 11/24/17  Yes McClung, Angela M, PA-C  calcium acetate (PHOSLO) 667 MG capsule Take 1,334 mg by mouth 3 (three) times daily with meals.    Yes [provider]  pantoprazole (PROTONIX) 20 MG tablet Take 1 tablet (20  mg total) by mouth 2 (two) times daily before a meal. 11/23/17  Yes Argentina Donovan, PA-C      Results for orders placed or performed during the hospital encounter of 05/13/18 (from the past 48 hour(s))  CBC with Differential     Status: Abnormal   Collection Time: 05/13/18 12:46 AM  Result Value Ref Range   WBC 6.4 4.0 - 10.5 K/uL   RBC 3.60 (L) 3.87 - 5.11 MIL/uL   Hemoglobin 10.5 (L) 12.0 - 15.0 g/dL   HCT 32.0 (L) 36.0 - 46.0 %   MCV 88.9 80.0 - 100.0 fL   MCH 29.2 26.0 - 34.0 pg   MCHC 32.8 30.0 - 36.0 g/dL   RDW 13.4 11.5 - 15.5 %   Platelets 139 (L) 150 - 400 K/uL    Comment: REPEATED TO VERIFY   nRBC 0.0 0.0 - 0.2 %   Neutrophils Relative % 72 %   Neutro Abs 4.6 1.7 - 7.7 K/uL   Lymphocytes Relative 17 %   Lymphs Abs 1.1 0.7 - 4.0 K/uL   Monocytes Relative 7 %   Monocytes Absolute 0.4 0.1 - 1.0 K/uL   Eosinophils Relative 3 %   Eosinophils Absolute 0.2 0.0 - 0.5 K/uL   Basophils Relative 1 %   Basophils Absolute 0.1 0.0 - 0.1 K/uL   Immature Granulocytes 0 %   Abs Immature Granulocytes 0.02 0.00 - 0.07 K/uL    Comment: Performed at [facility], [address] [place], [place] [address]  Comprehensive metabolic panel     Status: Abnormal   Collection Time: 05/13/18 12:46 AM  Result Value Ref Range   Sodium 131 (L) 135 - 145 mmol/L   Potassium 4.2 3.5 - 5.1 mmol/L   Chloride 91 (L) 98 - 111 mmol/L   CO2 23 22 - 32 mmol/L   Glucose, Bld 83 70 - 99 mg/dL   BUN 20 6 - 20 mg/dL   Creatinine, Ser 5.66 (H) 0.44 - 1.00 mg/dL   Calcium 9.9 8.9 - 10.3 mg/dL   Total Protein 7.3 6.5 - 8.1 g/dL   Albumin 3.5 3.5 - 5.0 g/dL   AST 43 (H) 15 - 41 U/L   ALT 34 0 - 44 U/L   Alkaline Phosphatase 119 38 - 126 U/L   Total Bilirubin 1.0 0.3 - 1.2 mg/dL   GFR calc non Af Amer 8 (L) >60 mL/min   GFR calc Af Amer 9 (L) >60 mL/min   Anion gap 17 (H) 5 - 15    Comment: Performed at [facility], [place] [address], [place], [place] [address]  Troponin I - ONCE - STAT      Status: Abnormal   Collection Time: 05/13/18 12:46 AM  Result Value Ref Range   Troponin I 0.06 (HH) <0.03 ng/mL    Comment: CRITICAL RESULT CALLED TO, READ BACK BY AND VERIFIED WITH: GRINDSTAFF,S RN 05/13/2018 0150 JORDANS Performed at [facility], [place],  [place] [address]   Lactic acid, plasma     Status: None   Collection Time: 05/13/18  5:39 AM  Result Value Ref Range   Lactic Acid, Venous 1.1 0.5 - 1.9 mmol/L    Comment: Performed at [facility] [address], [place], [place] [address]  Brain natriuretic peptide     Status: Abnormal   Collection Time: 05/13/18  5:39 AM  Result Value Ref Range   B Natriuretic Peptide >4,500.0 (H) 0.0 - 100.0 pg/mL    Comment: REPEATED TO VERIFY Performed at [facility] [address] [place], [place] [address]   Troponin I - Now Then Q6H     Status: Abnormal   Collection Time: 05/13/18  5:39 AM  Result Value Ref Range   Troponin I 0.06 (HH) <0.03 ng/mL    Comment: CRITICAL VALUE NOTED.  VALUE IS CONSISTENT WITH PREVIOUSLY REPORTED AND CALLED VALUE. Performed at [facility], [place] [address] [place], [place] [address]   Lactic acid, plasma     Status: None   Collection Time: 05/13/18  8:03 AM  Result Value Ref Range   Lactic Acid, Venous 0.8 0.5 - 1.9 mmol/L    Comment: Performed at [facility] [address], [place], [place] [address]  Troponin I - Now Then Q6H     Status: Abnormal   Collection Time: 05/13/18 11:28 AM  Result Value Ref Range   Troponin I 0.06 (HH) <0.03 ng/mL    Comment: CRITICAL VALUE NOTED.  VALUE IS CONSISTENT WITH PREVIOUSLY REPORTED AND CALLED VALUE. Performed at [facility], [place] [address], [place], [place] [address]    ROS = See positives as noted on HPI  Physical Exam: Vitals:   05/13/18 1130 05/13/18 1200  BP: (!) 171/104 (!) 176/91  Pulse: 93 89  Resp: (!) 25 14  Temp:    SpO2: 100% 99%     General: alert AAF on HD,NAD , appropriate   HEENT: , MMM, not icteric Neck:supple no jvd Heart: Occas Irreg beat,(pvc on tele) no mur , rub , ,gallop Lungs: cta  non labored on HD  Abdomen: bs +, soft NTY, ND  Extremities: trace bipdal Skin: No overt rash Neuro: Alert , OX3, moves all extrem  Dialysis Access: Patent on HD Left IJ  Perm cath   Dialysis Orders: Center:[facility]  on TTS . EDW 60.5  HD Bath 2k/2.5 ca  Time 3hr 72min  Heparin No hep (hep in  pc ports). L IJ  pcath (for new avgg 05/17/18)     Calcitriol 4 mcg IV/HD      Assessment/Plan 1. Pulmonary edema /vol overload ,needs lower edw  2. HTN urgency= Volume related  And possible need for po bp meds (none listed at her op Kid center) 3. ESRD - TTS schedule Noted  Has P cath ( plans for New AVGG 05/17/18) 4. Anemia of CKD   - HGB 10.5  No esa at op , monitor hgb  5. Metabolic bone disease -  PO vit d on hd , phos binder   Karen Haber, PA-C [place] [phone] 05/13/2018, 3:00 PM   Pt seen, examined and agree w A/P as above.  Kelly Splinter MD [employer] pager [phone]   05/13/2018, 4:13 PM

## 2018-05-13 NOTE — ED Triage Notes (Signed)
Pt coming in my EMS with complaints of shortness of breath for the past 2 days. Waves of nausea.Tried home neb treatment with no relief. States she has dialysis tomorrow.

## 2018-05-13 NOTE — ED Notes (Signed)
Patient was given a cup of ice. 

## 2018-05-13 NOTE — Progress Notes (Signed)
PROGRESS NOTE                                                                                                                                                                                                             Patient Demographics:    Karen Morrow, is a 57 y.o. female, DOB - 07-Sep-1960, OQH:[id number]  Admit date - 05/13/2018   Admitting Physician No admitting provider for patient encounter.  Outpatient Primary MD for the patient is [name], MD  LOS - 0  Chief Complaint  Patient presents with  . Shortness of Breath       Brief Narrative   - 57 year old African-American female with history of ESRD on Tuesday Thursday Saturday schedule, recent admission for MSSA bacteremia she has finished her antibiotic course, there was clinical suspicion of endocarditis but she could never undergo TEE successfully due to health issues.  Patient has been having longstanding history of poorly controlled blood pressure with multiple ER visits suggesting extremely high blood pressures in the range of 546F systolic to 681 diastolic.  Apparently patient went for dialysis the day before admission, dialysis could not be completed due to very high blood pressures and shortness of breath and she was sent to the ER.  In the ER she was diagnosed with acute on chronic diastolic CHF, fluid overload, borderline troponin, hypertensive crisis, ER consulted nephrology and requested [place] to admit the patient.   Subjective:    Karen Morrow today has, No headache, No chest pain, No abdominal pain - No Nausea, No new weakness tingling or numbness, No Cough - But +ve SOB.   Assessment  & Plan :     1.  Hypertensive urgency/crisis causing acute on chronic diastolic CHF with pulmonary edema and hypoxic respiratory failure.  In compliance with her home blood pressure medications, oral Norvasc and labetalol have been started, she is on nitro drip which we will try to titrate off, goal blood  pressure systolic will be between 275-170.  Have requested nephrology to dialyze the patient for fluid removal.  She has been counseled on compliance.  Will repeat echocardiogram due to recent history of endocarditis and suspicion for amyloid heart on the last echo.  Continue supportive care with oxygen and nebulizer treatments as needed.  2.  Acute on chronic diastolic CHF.  EF preserved on recent echocardiogram, repeat echo and monitor.  3.  Borderline elevated troponin.  Trend is flat and and non-ACS pattern, no chest pain, this is due to  mild demand ischemia brought on by #1 above in a ESRD patient.  No further work-up.  4.  ESRD.  On Tuesday, Thursday and Saturday schedule.  Nephrology consulted will be dialyzed.  5.  Recent MSSA bacteremia.  Finished antibiotics currently stable will monitor clinically.  6..  On PPI.    Family Communication  :  None  Code Status :  Full  Disposition Plan  :  TBD  Consults  :  Renal  Procedures  :    TTE - 02-2018 - - Compared to a prior study in 2018, the LVEF is stable. There is now severe LVH with speckling of the myocardium, highly suggestive in an ESRD patient of amyloidosis. Consider PYP scan.  There is now mild aortic stenosis (with a bicuspid or  functionally bicuspid valve) and mild to moderate AI.  DVT Prophylaxis  :  Heparin    Lab Results  Component Value Date   PLT 139 (L) 05/13/2018    Diet :  Diet Order            Diet renal with fluid restriction Fluid restriction: 1200 mL Fluid; Room service appropriate? Yes; Fluid consistency: Thin  Diet effective now               Inpatient Medications Scheduled Meds: . albuterol      . amLODipine  10 mg Oral Daily  . calcium acetate  1,334 mg Oral TID WC  . Chlorhexidine Gluconate Cloth  6 each Topical Q0600  . heparin  5,000 Units Subcutaneous Q8H  . labetalol  100 mg Oral BID  . pantoprazole  20 mg Oral BID AC   Continuous Infusions: . nitroGLYCERIN 70 mcg/min (05/13/18  0621)   PRN Meds:.acetaminophen, albuterol, labetalol, ondansetron (ZOFRAN) IV  Antibiotics  :   Anti-infectives (From admission, onward)   None          Objective:   Vitals:   05/13/18 0630 05/13/18 0645 05/13/18 0700 05/13/18 0830  BP: (!) 191/114 (!) 201/118 (!) 207/121 (!) 205/118  Pulse: 89 91 88 90  Resp:    (!) 23  Temp:      TempSrc:      SpO2: 99% 100% 99% 99%  Weight:      Height:        Wt Readings from Last 3 Encounters:  05/13/18 62.6 kg  05/05/18 63 kg  04/25/18 61.2 kg    No intake or output data in the 24 hours ending 05/13/18 0951   Physical Exam  Awake Alert, Oriented X 3, No new F.N deficits, Normal affect [place].AT,PERRAL Supple Neck,No JVD, No cervical lymphadenopathy appriciated.  Symmetrical Chest wall movement, Good air movement bilaterally, +ve rales RRR,No Gallops,Rubs or new Murmurs, No Parasternal Heave +ve B.Sounds, Abd Soft, No tenderness, No organomegaly appriciated, No rebound - guarding or rigidity. No Cyanosis, Clubbing or edema, No new Rash or bruise       Data Review:    CBC Recent Labs  Lab 05/13/18 0046  WBC 6.4  HGB 10.5*  HCT 32.0*  PLT 139*  MCV 88.9  MCH 29.2  MCHC 32.8  RDW 13.4  LYMPHSABS 1.1  MONOABS 0.4  EOSABS 0.2  BASOSABS 0.1    Chemistries  Recent Labs  Lab 05/13/18 0046  NA 131*  K 4.2  CL 91*  CO2 23  GLUCOSE 83  BUN 20  CREATININE 5.66*  CALCIUM 9.9  AST 43*  ALT 34  ALKPHOS 119  BILITOT 1.0   ------------------------------------------------------------------------------------------------------------------  No results for input(s): CHOL, HDL, LDLCALC, TRIG, CHOLHDL, LDLDIRECT in the last 72 hours.  Lab Results  Component Value Date   HGBA1C 4.7 11/26/2013   ------------------------------------------------------------------------------------------------------------------ No results for input(s): TSH, T4TOTAL, T3FREE, THYROIDAB in the last 72 hours.  Invalid input(s):  FREET3 ------------------------------------------------------------------------------------------------------------------ No results for input(s): VITAMINB12, FOLATE, FERRITIN, TIBC, IRON, RETICCTPCT in the last 72 hours.  Coagulation profile No results for input(s): INR, PROTIME in the last 168 hours.  No results for input(s): DDIMER in the last 72 hours.  Cardiac Enzymes Recent Labs  Lab 05/13/18 0046 05/13/18 0539  TROPONINI 0.06* 0.06*   ------------------------------------------------------------------------------------------------------------------    Component Value Date/Time   BNP >4,500.0 (H) 05/13/2018 0539   BNP >4200.0 (H) 04/16/2016 1116    Micro Results No results found for this or any previous visit (from the past 240 hour(s)).  Radiology Reports Dg Chest 2 View  Result Date: 05/13/2018 CLINICAL DATA:  Shortness of breath for 2 days, nausea. EXAM: CHEST - 2 VIEW COMPARISON:  Chest radiograph March 16, 2018 FINDINGS: Moderate cardiomegaly. Calcified aortic arch. Pulmonary vascular congestion and mild interstitial prominence. Small RIGHT pleural effusion. Tunneled dialysis catheter via LEFT internal jugular venous approach, distal tips projecting distal superior vena cava. No pneumothorax. Multiple vascular clips RIGHT arm. IMPRESSION: 1. Cardiomegaly and interstitial prominence likely reflecting pulmonary edema. Small RIGHT pleural effusion. 2.  Aortic Atherosclerosis (ICD10-I70.0). Electronically Signed   By: Elon Alas M.D.   On: 05/13/2018 01:36   Vas [place] Upper Ext Vein Mapping (pre-op Avf)  Result Date: 05/08/2018 UPPER EXTREMITY VEIN MAPPING  Indications: Pre-access. Performing Technologist: Alvia Grove RVT  Examination Guidelines: A complete evaluation includes B-mode imaging, spectral Doppler, color Doppler, and power Doppler as needed of all accessible portions of each vessel. Bilateral testing is considered an integral part of a complete examination.  Limited examinations for reoccurring indications may be performed as noted. +-----------------+-------------+----------+---------------------+ Left Cephalic    Diameter (cm)Depth (cm)      Findings        +-----------------+-------------+----------+---------------------+ Mid upper arm                                    [place]           +-----------------+-------------+----------+---------------------+ Dist upper arm                          not visualized and [place] +-----------------+-------------+----------+---------------------+ Antecubital fossa    0.12                        [place]           +-----------------+-------------+----------+---------------------+ Prox forearm         0.09                                     +-----------------+-------------+----------+---------------------+ +-----------------+-------------+----------+--------------+ Left Basilic     Diameter (cm)Depth (cm)   Findings    +-----------------+-------------+----------+--------------+ Shoulder                                not visualized +-----------------+-------------+----------+--------------+ Prox upper arm       0.32  [place]       +-----------------+-------------+----------+--------------+ Mid upper arm        0.14                              +-----------------+-------------+----------+--------------+ Dist upper arm                                [place]       +-----------------+-------------+----------+--------------+ Antecubital fossa    0.18                            +-----------------+-------------+----------+--------------+ Prox forearm         0.15                              +-----------------+-------------+----------+--------------+ Summary:  Left: Evidence of prior history of superficial vein thrombophlebitis       of the left Basilic and Cephalic veins. No compression of the       cephalic vein from the mid upper arm to the antecubital fossa.       No compression  of the basilic vein in the proximal upper arm,       distal upper arm and antecubital fossa. *See table(s) above for measurements and observations.  Diagnosing physician: Harold Barban MD Electronically signed by Harold Barban MD on 05/08/2018 at 10:47:03 AM.    Final     Time Spent in minutes  30   Lala Lund M.D on 05/13/2018 at 9:51 AM  To page go to [email or web address] - password [facility]

## 2018-05-14 ENCOUNTER — Observation Stay (HOSPITAL_BASED_OUTPATIENT_CLINIC_OR_DEPARTMENT_OTHER): Payer: Medicare Other

## 2018-05-14 DIAGNOSIS — I351 Nonrheumatic aortic (valve) insufficiency: Secondary | ICD-10-CM | POA: Diagnosis not present

## 2018-05-14 DIAGNOSIS — Z992 Dependence on renal dialysis: Secondary | ICD-10-CM | POA: Diagnosis not present

## 2018-05-14 DIAGNOSIS — N2581 Secondary hyperparathyroidism of renal origin: Secondary | ICD-10-CM | POA: Diagnosis not present

## 2018-05-14 DIAGNOSIS — I160 Hypertensive urgency: Secondary | ICD-10-CM | POA: Diagnosis not present

## 2018-05-14 DIAGNOSIS — N186 End stage renal disease: Secondary | ICD-10-CM | POA: Diagnosis not present

## 2018-05-14 DIAGNOSIS — D631 Anemia in chronic kidney disease: Secondary | ICD-10-CM | POA: Diagnosis not present

## 2018-05-14 LAB — MRSA PCR SCREENING: MRSA by PCR: NEGATIVE

## 2018-05-14 LAB — BASIC METABOLIC PANEL
Anion gap: 15 (ref 5–15)
BUN: 10 mg/dL (ref 6–20)
CO2: 25 mmol/L (ref 22–32)
Calcium: 9.3 mg/dL (ref 8.9–10.3)
Chloride: 92 mmol/L — ABNORMAL LOW (ref 98–111)
Creatinine, Ser: 3.87 mg/dL — ABNORMAL HIGH (ref 0.44–1.00)
GFR calc Af Amer: 14 mL/min — ABNORMAL LOW (ref >60)
GFR calc non Af Amer: 12 mL/min — ABNORMAL LOW (ref >60)
Glucose, Bld: 105 mg/dL — ABNORMAL HIGH (ref 70–99)
Potassium: 3.8 mmol/L (ref 3.5–5.1)
Sodium: 132 mmol/L — ABNORMAL LOW (ref 135–145)

## 2018-05-14 LAB — ECHOCARDIOGRAM LIMITED
Height: 67 in
Weight: 2025.6 oz

## 2018-05-14 MED ORDER — CLONIDINE HCL 0.2 MG PO TABS
0.2000 mg | ORAL_TABLET | Freq: Three times a day (TID) | ORAL | Status: DC
  Administered 2018-05-14 (×2): 0.2 mg via ORAL
  Filled 2018-05-14 (×2): qty 1

## 2018-05-14 MED ORDER — HYDRALAZINE HCL 50 MG PO TABS
50.0000 mg | ORAL_TABLET | Freq: Three times a day (TID) | ORAL | Status: DC
  Administered 2018-05-14 – 2018-05-16 (×6): 50 mg via ORAL
  Filled 2018-05-14 (×6): qty 1

## 2018-05-14 MED ORDER — HYDRALAZINE HCL 50 MG PO TABS: 100.0000 mg | ORAL_TABLET | Freq: Three times a day (TID) | ORAL | Status: DC

## 2018-05-14 MED ORDER — LABETALOL HCL 200 MG PO TABS
200.0000 mg | ORAL_TABLET | Freq: Two times a day (BID) | ORAL | Status: DC
  Administered 2018-05-14 – 2018-05-15 (×4): 200 mg via ORAL
  Filled 2018-05-14 (×4): qty 1

## 2018-05-14 MED ORDER — HYDRALAZINE HCL 50 MG PO TABS: 50.0000 mg | ORAL_TABLET | Freq: Three times a day (TID) | ORAL | Status: DC

## 2018-05-14 NOTE — Progress Notes (Signed)
I touched base with Dr. Candiss Norse about patients blood pressure being so liable throughout my shift. Patient did have an episode of a headache which was easily treated with Tylenol. The she randomly had a period of acute nausea and dry heaving which subsided. I have titrated her IV nitro today several times but patient is having spikes of BPs over 377 systolically. Dr. Candiss Norse to place more orders for hypertensive meds but he is ok with patients blood pressure being in the 160s range as we do not want to drop her blood pressure too quickly. Will continue to monitor

## 2018-05-14 NOTE — Care Management Obs Status (Signed)
[place] NOTIFICATION   Patient Details  Name: Karen Morrow MRN: [id number] Date of Birth: 11/14/1960   Medicare Observation Status Notification Given:  Yes    Carles Collet, RN 05/14/2018, 2:24 PM

## 2018-05-14 NOTE — Progress Notes (Addendum)
Subjective:  Breathing better after hd last pm / still on IV ntg for bp elevation   Objective Vital signs in last 24 hours: Vitals:   05/14/18 0912 05/14/18 0951 05/14/18 1048 05/14/18 1148  BP: (!) 144/113 (!) 209/101 (!) 186/108 (!) 180/106  Pulse:      Resp:  16  17  Temp:    98.3 F (36.8 C)  TempSrc:      SpO2:    100%  Weight:      Height:       Weight change: 0.404 kg  General: alert AAF on HD,NAD , appropriate  Lungs: cta  non labored on HD  Abdomen: bs +, soft NTY, ND  Extremities: trace bipdal  Dialysis Access:Left IJ  Perm cath   Dialysis Orders: Center:[facility]  on TTS . EDW 60.5  HD Bath 2k/2.5 ca  Time 3hr 29min  Heparin No hep (hep in  pc ports). L IJ  pcath (for new avgg 05/17/18)     Calcitriol 4 mcg IV/HD      Assessment/Plan 1. Pulmonary edema /vol overload  = HD last pm  With UF 4394 post wt below edw 57.4 =needs lower edw  2. HTN urgency= ?? Volume related /  And need for more  bp meds / BP still up this am  On IV ntg for bp  Try to dc with incr meds / hd tomor extra  / states was on amlodipine 10mg  hs / added in hosp Labetalol 200mg  bid , Hydralazine 50 mg  q 8 hr  3. ESRD - TTS schedule tomr  Extra tx for vol  bp ^ / Noted  Has P cath ( plans for New AVGG 05/17/18) 4. Anemia of CKD   - HGB 10.5  No esa at op , monitor hgb  5. Metabolic bone disease -  PO vit d on hd , phos binder  Ernest Haber, PA-C [place] [phone] 05/14/2018,1:38 PM  LOS: 0 days   Pt seen and agree w/ A/P as above. Patient's BP newly high, was on norvasc only at home,  now added labetalol and hydralazine. Also lowering vol w/ HD.  Kelly Splinter MD [employer] pgr [phone]   05/14/2018, 3:00 PM      Labs: Basic Metabolic Panel: Recent Labs  Lab 05/13/18 0046 05/14/18 0337  NA 131* 132*  K 4.2 3.8  CL 91* 92*  CO2 23 25  GLUCOSE 83 105*  BUN 20 10  CREATININE 5.66* 3.87*  CALCIUM 9.9 9.3   Liver Function Tests: Recent  Labs  Lab 05/13/18 0046  AST 43*  ALT 34  ALKPHOS 119  BILITOT 1.0  PROT 7.3  ALBUMIN 3.5   No results for input(s): LIPASE, AMYLASE in the last 168 hours. No results for input(s): AMMONIA in the last 168 hours. CBC: Recent Labs  Lab 05/13/18 0046  WBC 6.4  NEUTROABS 4.6  HGB 10.5*  HCT 32.0*  MCV 88.9  PLT 139*   Cardiac Enzymes: Recent Labs  Lab 05/13/18 0046 05/13/18 0539 05/13/18 1128 05/13/18 1806  TROPONINI 0.06* 0.06* 0.06* 0.08*   CBG: No results for input(s): GLUCAP in the last 168 hours.  Studies/Results: Dg Chest 2 View  Result Date: 05/13/2018 CLINICAL DATA:  Shortness of breath for 2 days, nausea. EXAM: CHEST - 2 VIEW COMPARISON:  Chest radiograph March 16, 2018 FINDINGS: Moderate cardiomegaly. Calcified aortic arch. Pulmonary vascular congestion and mild interstitial prominence. Small RIGHT pleural effusion. Tunneled dialysis catheter  via LEFT internal jugular venous approach, distal tips projecting distal superior vena cava. No pneumothorax. Multiple vascular clips RIGHT arm. IMPRESSION: 1. Cardiomegaly and interstitial prominence likely reflecting pulmonary edema. Small RIGHT pleural effusion. 2.  Aortic Atherosclerosis (ICD10-I70.0). Electronically Signed   By: Elon Alas M.D.   On: 05/13/2018 01:36   Medications: . sodium chloride    . sodium chloride    . nitroGLYCERIN 50 mcg/min (05/14/18 0952)   . amLODipine  10 mg Oral Daily  . calcium acetate  1,334 mg Oral TID WC  . Chlorhexidine Gluconate Cloth  6 each Topical Q0600  . heparin  3,000 Units Dialysis Q T,Th,Sa-HD  . heparin  5,000 Units Subcutaneous Q8H  . hydrALAZINE  50 mg Oral Q8H  . labetalol  200 mg Oral BID  . pantoprazole  20 mg Oral BID AC

## 2018-05-14 NOTE — Progress Notes (Signed)
PROGRESS NOTE                                                                                                                                                                                                             Patient Demographics:    Karen Morrow, is a 57 y.o. female, DOB - [birth year], WUX:[id number]  Admit date - 05/13/2018   Admitting Physician Etta Quill, DO  Outpatient Primary MD for the patient is [name], MD  LOS - 0  Chief Complaint  Patient presents with  . Shortness of Breath       Brief Narrative   - 57 year old African-American female with history of ESRD on Tuesday Thursday Saturday schedule, recent admission for MSSA bacteremia she has finished her antibiotic course, there was clinical suspicion of endocarditis but she could never undergo TEE successfully due to health issues.  Patient has been having longstanding history of poorly controlled blood pressure with multiple ER visits suggesting extremely high blood pressures in the range of 253G systolic to 644 diastolic.  Apparently patient went for dialysis the day before admission, dialysis could not be completed due to very high blood pressures and shortness of breath and she was sent to the ER.  In the ER she was diagnosed with acute on chronic diastolic CHF, fluid overload, borderline troponin, hypertensive crisis, ER consulted nephrology and requested [place] to admit the patient.   Subjective:   Patient in bed, appears comfortable, denies any headache, no fever, no chest pain or pressure, improved shortness of breath , no abdominal pain. No focal weakness.    Assessment  & Plan :     1.  Hypertensive urgency/crisis causing acute on chronic diastolic CHF with pulmonary edema and hypoxic respiratory failure.  ? compliance with her home blood pressure medications, remains on nitro drip, have adjusted oral medications, continue Norvasc, have doubled oral labetalol and have added high-dose  hydralazine as well.  Continue monitoring blood pressure and adjust medications try to titrate off nitro drip with a goal blood pressure of systolic between 1 03-4 60.  With fluid removal during HD shortness of breath has improved.  Echo pending.  2.  Acute on chronic diastolic CHF.  EF preserved on recent echocardiogram, repeat echo and monitor.  Doing well through HD.  3.  Borderline elevated troponin.  Trend is flat and and non-ACS pattern, no chest pain, this is due to mild demand ischemia brought on by #1 above in a ESRD patient.  No further work-up.  4.  ESRD.  On Tuesday, Thursday and Saturday schedule.  Nephrology consulted will be dialyzed as needed and per schedule.  5.  Recent MSSA bacteremia.  Finished antibiotics currently stable will monitor clinically.  6..  On PPI.    Family Communication  :  None  Code Status :  Full  Disposition Plan  :  TBD  Consults  :  Renal  Procedures  :    TTE - 02-2018 - - Compared to a prior study in 2018, the LVEF is stable. There is now severe LVH with speckling of the myocardium, highly suggestive in an ESRD patient of amyloidosis. Consider PYP scan.  There is now mild aortic stenosis (with a bicuspid or  functionally bicuspid valve) and mild to moderate AI.  DVT Prophylaxis  :  Heparin    Lab Results  Component Value Date   PLT 139 (L) 05/13/2018    Diet :  Diet Order            Diet renal with fluid restriction Fluid restriction: 1200 mL Fluid; Room service appropriate? Yes; Fluid consistency: Thin  Diet effective now               Inpatient Medications Scheduled Meds: . amLODipine  10 mg Oral Daily  . calcium acetate  1,334 mg Oral TID WC  . Chlorhexidine Gluconate Cloth  6 each Topical Q0600  . heparin  3,000 Units Dialysis Q T,Th,Sa-HD  . heparin  5,000 Units Subcutaneous Q8H  . hydrALAZINE  50 mg Oral Q8H  . labetalol  200 mg Oral BID  . pantoprazole  20 mg Oral BID AC   Continuous Infusions: . sodium chloride     . sodium chloride    . nitroGLYCERIN 50 mcg/min (05/14/18 0952)   PRN Meds:.sodium chloride, sodium chloride, acetaminophen, albuterol, alteplase, labetalol, ondansetron (ZOFRAN) IV  Antibiotics  :   Anti-infectives (From admission, onward)   None          Objective:   Vitals:   05/14/18 0724 05/14/18 0812 05/14/18 0912 05/14/18 0951  BP: (!) 188/112 (!) 202/118 (!) 144/113 (!) 209/101  Pulse:      Resp: 18   16  Temp:      TempSrc:      SpO2:      Weight:      Height:        Wt Readings from Last 3 Encounters:  05/14/18 57.4 kg  05/05/18 63 kg  04/25/18 61.2 kg     Intake/Output Summary (Last 24 hours) at 05/14/2018 1008 Last data filed at 05/14/2018 0657 Gross per 24 hour  Intake 480.63 ml  Output 4394 ml  Net -3913.37 ml     Physical Exam  Awake Alert, Oriented X 2, No new F.N deficits, Normal affect [place].AT,PERRAL Supple Neck,No JVD, No cervical lymphadenopathy appriciated.  Symmetrical Chest wall movement, Good air movement bilaterally, CTAB RRR,No Gallops, Rubs or new Murmurs, No Parasternal Heave +ve B.Sounds, Abd Soft, No tenderness, No organomegaly appriciated, No rebound - guarding or rigidity. No Cyanosis, Clubbing or edema, No new Rash or bruise    Data Review:    CBC Recent Labs  Lab 05/13/18 0046  WBC 6.4  HGB 10.5*  HCT 32.0*  PLT 139*  MCV 88.9  MCH 29.2  MCHC 32.8  RDW 13.4  LYMPHSABS 1.1  MONOABS 0.4  EOSABS 0.2  BASOSABS 0.1    Chemistries  Recent Labs  Lab 05/13/18 0046 05/14/18 7169  NA 131* 132*  K 4.2 3.8  CL 91* 92*  CO2 23 25  GLUCOSE 83 105*  BUN 20 10  CREATININE 5.66* 3.87*  CALCIUM 9.9 9.3  AST 43*  --   ALT 34  --   ALKPHOS 119  --   BILITOT 1.0  --    ------------------------------------------------------------------------------------------------------------------ No results for input(s): CHOL, HDL, LDLCALC, TRIG, CHOLHDL, LDLDIRECT in the last 72 hours.  Lab Results  Component Value Date     HGBA1C 4.7 11/26/2013   ------------------------------------------------------------------------------------------------------------------ No results for input(s): TSH, T4TOTAL, T3FREE, THYROIDAB in the last 72 hours.  Invalid input(s): FREET3 ------------------------------------------------------------------------------------------------------------------ No results for input(s): VITAMINB12, FOLATE, FERRITIN, TIBC, IRON, RETICCTPCT in the last 72 hours.  Coagulation profile No results for input(s): INR, PROTIME in the last 168 hours.  No results for input(s): DDIMER in the last 72 hours.  Cardiac Enzymes Recent Labs  Lab 05/13/18 0539 05/13/18 1128 05/13/18 1806  TROPONINI 0.06* 0.06* 0.08*   ------------------------------------------------------------------------------------------------------------------    Component Value Date/Time   BNP >4,500.0 (H) 05/13/2018 0539   BNP >4200.0 (H) 04/16/2016 1116    Micro Results Recent Results (from the past 240 hour(s))  MRSA PCR Screening     Status: None   Collection Time: 05/14/18  7:43 AM  Result Value Ref Range Status   MRSA by PCR NEGATIVE NEGATIVE Final    Comment:        The GeneXpert MRSA Assay (FDA approved for NASAL specimens only), is one component of a comprehensive MRSA colonization surveillance program. It is not intended to diagnose MRSA infection nor to guide or monitor treatment for MRSA infections. Performed at [facility], [place] [address], [place], [place] [address]     Radiology Reports Dg Chest 2 View  Result Date: 05/13/2018 CLINICAL DATA:  Shortness of breath for 2 days, nausea. EXAM: CHEST - 2 VIEW COMPARISON:  Chest radiograph March 16, 2018 FINDINGS: Moderate cardiomegaly. Calcified aortic arch. Pulmonary vascular congestion and mild interstitial prominence. Small RIGHT pleural effusion. Tunneled dialysis catheter via LEFT internal jugular venous approach, distal tips projecting distal  superior vena cava. No pneumothorax. Multiple vascular clips RIGHT arm. IMPRESSION: 1. Cardiomegaly and interstitial prominence likely reflecting pulmonary edema. Small RIGHT pleural effusion. 2.  Aortic Atherosclerosis (ICD10-I70.0). Electronically Signed   By: Elon Alas M.D.   On: 05/13/2018 01:36   Vas [place] Upper Ext Vein Mapping (pre-op Avf)  Result Date: 05/08/2018 UPPER EXTREMITY VEIN MAPPING  Indications: Pre-access. Performing Technologist: Alvia Grove RVT  Examination Guidelines: A complete evaluation includes B-mode imaging, spectral Doppler, color Doppler, and power Doppler as needed of all accessible portions of each vessel. Bilateral testing is considered an integral part of a complete examination. Limited examinations for reoccurring indications may be performed as noted. +-----------------+-------------+----------+---------------------+ Left Cephalic    Diameter (cm)Depth (cm)      Findings        +-----------------+-------------+----------+---------------------+ Mid upper arm                                    [place]           +-----------------+-------------+----------+---------------------+ Dist upper arm                          not visualized and [place] +-----------------+-------------+----------+---------------------+ Antecubital fossa    0.12  [place]           +-----------------+-------------+----------+---------------------+ Prox forearm         0.09                                     +-----------------+-------------+----------+---------------------+ +-----------------+-------------+----------+--------------+ Left Basilic     Diameter (cm)Depth (cm)   Findings    +-----------------+-------------+----------+--------------+ Shoulder                                not visualized +-----------------+-------------+----------+--------------+ Prox upper arm       0.32                     [place]        +-----------------+-------------+----------+--------------+ Mid upper arm        0.14                              +-----------------+-------------+----------+--------------+ Dist upper arm                                [place]       +-----------------+-------------+----------+--------------+ Antecubital fossa    0.18                     [place]       +-----------------+-------------+----------+--------------+ Prox forearm         0.15                              +-----------------+-------------+----------+--------------+ Summary:  Left: Evidence of prior history of superficial vein thrombophlebitis       of the left Basilic and Cephalic veins. No compression of the       cephalic vein from the mid upper arm to the antecubital fossa.       No compression of the basilic vein in the proximal upper arm,       distal upper arm and antecubital fossa. *See table(s) above for measurements and observations.  Diagnosing physician: Harold Barban MD Electronically signed by Harold Barban MD on 05/08/2018 at 10:47:03 AM.    Final     Time Spent in minutes  30   Lala Lund M.D on 05/14/2018 at 10:08 AM  To page go to [email or web address] - password [email or web address]

## 2018-05-14 NOTE — Progress Notes (Signed)
  Echocardiogram 2D Echocardiogram has been performed.  Jennette Dubin 05/14/2018, 3:35 PM

## 2018-05-15 DIAGNOSIS — Z8679 Personal history of other diseases of the circulatory system: Secondary | ICD-10-CM | POA: Diagnosis not present

## 2018-05-15 DIAGNOSIS — D631 Anemia in chronic kidney disease: Secondary | ICD-10-CM | POA: Diagnosis present

## 2018-05-15 DIAGNOSIS — I248 Other forms of acute ischemic heart disease: Secondary | ICD-10-CM | POA: Diagnosis present

## 2018-05-15 DIAGNOSIS — I132 Hypertensive heart and chronic kidney disease with heart failure and with stage 5 chronic kidney disease, or end stage renal disease: Secondary | ICD-10-CM | POA: Diagnosis present

## 2018-05-15 DIAGNOSIS — D689 Coagulation defect, unspecified: Secondary | ICD-10-CM | POA: Diagnosis not present

## 2018-05-15 DIAGNOSIS — J810 Acute pulmonary edema: Secondary | ICD-10-CM | POA: Diagnosis not present

## 2018-05-15 DIAGNOSIS — I5033 Acute on chronic diastolic (congestive) heart failure: Secondary | ICD-10-CM | POA: Diagnosis present

## 2018-05-15 DIAGNOSIS — Z8619 Personal history of other infectious and parasitic diseases: Secondary | ICD-10-CM | POA: Diagnosis not present

## 2018-05-15 DIAGNOSIS — E8889 Other specified metabolic disorders: Secondary | ICD-10-CM | POA: Diagnosis present

## 2018-05-15 DIAGNOSIS — Z90710 Acquired absence of both cervix and uterus: Secondary | ICD-10-CM | POA: Diagnosis not present

## 2018-05-15 DIAGNOSIS — J9611 Chronic respiratory failure with hypoxia: Secondary | ICD-10-CM | POA: Diagnosis present

## 2018-05-15 DIAGNOSIS — I160 Hypertensive urgency: Secondary | ICD-10-CM | POA: Diagnosis present

## 2018-05-15 DIAGNOSIS — Z833 Family history of diabetes mellitus: Secondary | ICD-10-CM | POA: Diagnosis not present

## 2018-05-15 DIAGNOSIS — K219 Gastro-esophageal reflux disease without esophagitis: Secondary | ICD-10-CM | POA: Diagnosis present

## 2018-05-15 DIAGNOSIS — Z881 Allergy status to other antibiotic agents status: Secondary | ICD-10-CM | POA: Diagnosis not present

## 2018-05-15 DIAGNOSIS — Z8249 Family history of ischemic heart disease and other diseases of the circulatory system: Secondary | ICD-10-CM | POA: Diagnosis not present

## 2018-05-15 DIAGNOSIS — N2581 Secondary hyperparathyroidism of renal origin: Secondary | ICD-10-CM | POA: Diagnosis present

## 2018-05-15 DIAGNOSIS — Z992 Dependence on renal dialysis: Secondary | ICD-10-CM | POA: Diagnosis not present

## 2018-05-15 DIAGNOSIS — Z79899 Other long term (current) drug therapy: Secondary | ICD-10-CM | POA: Diagnosis not present

## 2018-05-15 DIAGNOSIS — J45909 Unspecified asthma, uncomplicated: Secondary | ICD-10-CM | POA: Diagnosis present

## 2018-05-15 DIAGNOSIS — N186 End stage renal disease: Secondary | ICD-10-CM | POA: Diagnosis present

## 2018-05-15 LAB — CBC
HCT: 27.3 % — ABNORMAL LOW (ref 36.0–46.0)
Hemoglobin: 9.2 g/dL — ABNORMAL LOW (ref 12.0–15.0)
MCH: 29.6 pg (ref 26.0–34.0)
MCHC: 33.7 g/dL (ref 30.0–36.0)
MCV: 87.8 fL (ref 80.0–100.0)
Platelets: 125 10*3/uL — ABNORMAL LOW (ref 150–400)
RBC: 3.11 MIL/uL — ABNORMAL LOW (ref 3.87–5.11)
RDW: 13.2 % (ref 11.5–15.5)
WBC: 3.8 10*3/uL — ABNORMAL LOW (ref 4.0–10.5)
nRBC: 0 % (ref 0.0–0.2)

## 2018-05-15 LAB — BASIC METABOLIC PANEL
Anion gap: 14 (ref 5–15)
BUN: 18 mg/dL (ref 6–20)
CO2: 25 mmol/L (ref 22–32)
Calcium: 9.7 mg/dL (ref 8.9–10.3)
Chloride: 90 mmol/L — ABNORMAL LOW (ref 98–111)
Creatinine, Ser: 6.11 mg/dL — ABNORMAL HIGH (ref 0.44–1.00)
GFR calc Af Amer: 8 mL/min — ABNORMAL LOW (ref >60)
GFR calc non Af Amer: 7 mL/min — ABNORMAL LOW (ref >60)
Glucose, Bld: 82 mg/dL (ref 70–99)
Potassium: 4.2 mmol/L (ref 3.5–5.1)
Sodium: 129 mmol/L — ABNORMAL LOW (ref 135–145)

## 2018-05-15 MED ORDER — CINACALCET HCL 30 MG PO TABS
30.0000 mg | ORAL_TABLET | Freq: Every day | ORAL | Status: DC
  Administered 2018-05-15: 30 mg via ORAL
  Filled 2018-05-15: qty 1

## 2018-05-15 MED ORDER — RENA-VITE PO TABS
1.0000 | ORAL_TABLET | Freq: Every day | ORAL | Status: DC
  Administered 2018-05-15: 1 via ORAL
  Filled 2018-05-15: qty 1

## 2018-05-15 MED ORDER — ACETAMINOPHEN 325 MG PO TABS
ORAL_TABLET | ORAL | Status: AC
  Administered 2018-05-15: 650 mg via ORAL
  Filled 2018-05-15: qty 2

## 2018-05-15 MED ORDER — HEPARIN SODIUM (PORCINE) 1000 UNIT/ML IJ SOLN
INTRAMUSCULAR | Status: AC
  Administered 2018-05-15: 3000 [IU] via INTRAVENOUS_CENTRAL
  Filled 2018-05-15: qty 3

## 2018-05-15 MED ORDER — FERRIC CITRATE 1 GM 210 MG(FE) PO TABS
210.0000 mg | ORAL_TABLET | Freq: Three times a day (TID) | ORAL | Status: DC
  Administered 2018-05-15 – 2018-05-16 (×2): 210 mg via ORAL
  Filled 2018-05-15 (×4): qty 1

## 2018-05-15 MED ORDER — CLONIDINE HCL 0.1 MG PO TABS
0.1000 mg | ORAL_TABLET | Freq: Three times a day (TID) | ORAL | Status: DC
  Administered 2018-05-15 (×3): 0.1 mg via ORAL
  Filled 2018-05-15 (×3): qty 1

## 2018-05-15 NOTE — Progress Notes (Signed)
PROGRESS NOTE                                                                                                                                                                                                             Patient Demographics:    Karen Morrow, is a 57 y.o. female, DOB - 1961/04/09, VCB:[id number]  Admit date - 05/13/2018   Admitting Physician Etta Quill, DO  Outpatient Primary MD for the patient is [name], MD  LOS - 0  Chief Complaint  Patient presents with  . Shortness of Breath       Brief Narrative   - 57 year old African-American female with history of ESRD on Tuesday Thursday Saturday schedule, recent admission for MSSA bacteremia she has finished her antibiotic course, there was clinical suspicion of endocarditis but she could never undergo TEE successfully due to health issues.  Patient has been having longstanding history of poorly controlled blood pressure with multiple ER visits suggesting extremely high blood pressures in the range of 384Y systolic to 659 diastolic.  Apparently patient went for dialysis the day before admission, dialysis could not be completed due to very high blood pressures and shortness of breath and she was sent to the ER.  In the ER she was diagnosed with acute on chronic diastolic CHF, fluid overload, borderline troponin, hypertensive crisis, ER consulted nephrology and requested [place] to admit the patient.   Subjective:   Patient in bed, appears comfortable, denies any headache, no fever, no chest pain or pressure, much improved shortness of breath , no abdominal pain. No focal weakness.     Assessment  & Plan :     1.  Hypertensive urgency/crisis causing acute on chronic diastolic CHF with pulmonary edema and hypoxic respiratory failure.  Patient able compliance with blood pressure medications at home, he required nitro drip which has been titrated off early morning 05/15/2018, oral medications have been  aggressively adjusted and she is now on high-dose Norvasc, hydralazine, labetalol and Catapres.  Repeat echo appears stable.  Blood pressures are stabilizing.  Will try to keep her off of nitro drip today and if stable likely discharge early morning 05/16/2018.  2.  Acute on chronic diastolic CHF EF appears 55 to 60% on recent echo.  Fluid removal through HD now close to being compensated.  3.  Borderline elevated troponin.  Trend is flat and and non-ACS pattern, no chest pain, this is due to mild demand ischemia brought on by #1 above in  a ESRD patient.  No further work-up.  4.  ESRD.  On Tuesday, Thursday and Saturday schedule.  Nephrology consulted will be dialyzed as needed and per schedule.  5.  Recent MSSA bacteremia.  Finished antibiotics currently stable will monitor clinically.  6..  On PPI.    Family Communication  :  None  Code Status :  Full  Disposition Plan  :  TBD  Consults  :  Renal  Procedures  :    TTE 05/14/18 -  - Left ventricle: The cavity size was normal. Wall thickness was increased in a pattern of moderate to severe LVH. Systolic function was normal. The estimated ejection fraction was in the range of 55% to 60%. Wall motion was normal; there were no regional wall motion abnormalities. Doppler parameters are consistent with abnormal left ventricular relaxation (grade 1 diastolic dysfunction). - Aortic valve: Noncalcified annulus. Probably trileaflet; mildly calcified leaflets. There was mild to moderate regurgitation. - Mitral valve: Moderately calcified annulus. There was trivial regurgitation. - Left atrium: The atrium was moderately dilated. - Tricuspid valve: There was trivial regurgitation. - Pulmonary arteries: Systolic pressure could not be accurately estimated. - Pericardium, extracardiac: A trivial pericardial effusion was identified posterior to the heart.   TTE - 02-2018 - - Compared to a prior study in 2018, the LVEF is stable. There is now  severe LVH with speckling of the myocardium, highly suggestive in an ESRD patient of amyloidosis. Consider PYP scan.  There is now mild aortic stenosis (with a bicuspid or  functionally bicuspid valve) and mild to moderate AI.  DVT Prophylaxis  :  Heparin    Lab Results  Component Value Date   PLT 139 (L) 05/13/2018    Diet :  Diet Order            Diet renal with fluid restriction Fluid restriction: 1200 mL Fluid; Room service appropriate? Yes; Fluid consistency: Thin  Diet effective now               Inpatient Medications Scheduled Meds: . amLODipine  10 mg Oral Daily  . calcium acetate  1,334 mg Oral TID WC  . Chlorhexidine Gluconate Cloth  6 each Topical Q0600  . cloNIDine  0.1 mg Oral TID  . heparin  3,000 Units Dialysis Q T,Th,Sa-HD  . heparin  5,000 Units Subcutaneous Q8H  . hydrALAZINE  50 mg Oral Q8H  . labetalol  200 mg Oral BID  . pantoprazole  20 mg Oral BID AC   Continuous Infusions: . sodium chloride    . sodium chloride    . nitroGLYCERIN Stopped (05/14/18 2234)   PRN Meds:.sodium chloride, sodium chloride, acetaminophen, albuterol, alteplase, labetalol, ondansetron (ZOFRAN) IV  Antibiotics  :   Anti-infectives (From admission, onward)   None          Objective:   Vitals:   05/14/18 2006 05/15/18 0026 05/15/18 0428 05/15/18 0757  BP: 128/79 131/89 125/84 (!) 143/82  Pulse: 81 76 80 77  Resp: 17 18 18 15   Temp: 97.9 F (36.6 C)  (!) 97.5 F (36.4 C) 97.7 F (36.5 C)  TempSrc: Oral  Oral Oral  SpO2: 99%  100% 98%  Weight:   58.5 kg   Height:        Wt Readings from Last 3 Encounters:  05/15/18 58.5 kg  05/05/18 63 kg  04/25/18 61.2 kg     Intake/Output Summary (Last 24 hours) at 05/15/2018 0941 Last data filed at 05/15/2018 0650 Gross per  24 hour  Intake 240 ml  Output -  Net 240 ml     Physical Exam  Awake Alert, Oriented X 3, No new F.N deficits, Normal affect [place].AT,PERRAL Supple Neck,No JVD, No cervical lymphadenopathy  appriciated.  Symmetrical Chest wall movement, Good air movement bilaterally, CTAB RRR,No Gallops, Rubs or new Murmurs, No Parasternal Heave +ve B.Sounds, Abd Soft, No tenderness, No organomegaly appriciated, No rebound - guarding or rigidity. No Cyanosis, Clubbing or edema, No new Rash or bruise    Data Review:    CBC Recent Labs  Lab 05/13/18 0046  WBC 6.4  HGB 10.5*  HCT 32.0*  PLT 139*  MCV 88.9  MCH 29.2  MCHC 32.8  RDW 13.4  LYMPHSABS 1.1  MONOABS 0.4  EOSABS 0.2  BASOSABS 0.1    Chemistries  Recent Labs  Lab 05/13/18 0046 05/14/18 0337 05/15/18 0223  NA 131* 132* 129*  K 4.2 3.8 4.2  CL 91* 92* 90*  CO2 23 25 25   GLUCOSE 83 105* 82  BUN 20 10 18   CREATININE 5.66* 3.87* 6.11*  CALCIUM 9.9 9.3 9.7  AST 43*  --   --   ALT 34  --   --   ALKPHOS 119  --   --   BILITOT 1.0  --   --    ------------------------------------------------------------------------------------------------------------------ No results for input(s): CHOL, HDL, LDLCALC, TRIG, CHOLHDL, LDLDIRECT in the last 72 hours.  Lab Results  Component Value Date   HGBA1C 4.7 11/26/2013   ------------------------------------------------------------------------------------------------------------------ No results for input(s): TSH, T4TOTAL, T3FREE, THYROIDAB in the last 72 hours.  Invalid input(s): FREET3 ------------------------------------------------------------------------------------------------------------------ No results for input(s): VITAMINB12, FOLATE, FERRITIN, TIBC, IRON, RETICCTPCT in the last 72 hours.  Coagulation profile No results for input(s): INR, PROTIME in the last 168 hours.  No results for input(s): DDIMER in the last 72 hours.  Cardiac Enzymes Recent Labs  Lab 05/13/18 0539 05/13/18 1128 05/13/18 1806  TROPONINI 0.06* 0.06* 0.08*   ------------------------------------------------------------------------------------------------------------------    Component  Value Date/Time   BNP >4,500.0 (H) 05/13/2018 0539   BNP >4200.0 (H) 04/16/2016 1116    Micro Results Recent Results (from the past 240 hour(s))  MRSA PCR Screening     Status: None   Collection Time: 05/14/18  7:43 AM  Result Value Ref Range Status   MRSA by PCR NEGATIVE NEGATIVE Final    Comment:        The GeneXpert MRSA Assay (FDA approved for NASAL specimens only), is one component of a comprehensive MRSA colonization surveillance program. It is not intended to diagnose MRSA infection nor to guide or monitor treatment for MRSA infections. Performed at [facility], [place] [address] [place], [place] [address]     Radiology Reports Dg Chest 2 View  Result Date: 05/13/2018 CLINICAL DATA:  Shortness of breath for 2 days, nausea. EXAM: CHEST - 2 VIEW COMPARISON:  Chest radiograph March 16, 2018 FINDINGS: Moderate cardiomegaly. Calcified aortic arch. Pulmonary vascular congestion and mild interstitial prominence. Small RIGHT pleural effusion. Tunneled dialysis catheter via LEFT internal jugular venous approach, distal tips projecting distal superior vena cava. No pneumothorax. Multiple vascular clips RIGHT arm. IMPRESSION: 1. Cardiomegaly and interstitial prominence likely reflecting pulmonary edema. Small RIGHT pleural effusion. 2.  Aortic Atherosclerosis (ICD10-I70.0). Electronically Signed   By: Elon Alas M.D.   On: 05/13/2018 01:36   Vas [place] Upper Ext Vein Mapping (pre-op Avf)  Result Date: 05/08/2018 UPPER EXTREMITY VEIN MAPPING  Indications: Pre-access. Performing Technologist: Alvia Grove RVT  Examination Guidelines: A complete evaluation includes B-mode imaging, spectral Doppler, color Doppler, and power Doppler as needed of all accessible portions of each vessel. Bilateral testing is considered an integral part of a complete examination. Limited examinations for reoccurring indications may be performed as noted.  +-----------------+-------------+----------+---------------------+ Left Cephalic    Diameter (cm)Depth (cm)      Findings        +-----------------+-------------+----------+---------------------+ Mid upper arm                                    [place]           +-----------------+-------------+----------+---------------------+ Dist upper arm                          not visualized and [place] +-----------------+-------------+----------+---------------------+ Antecubital fossa    0.12                        [place]           +-----------------+-------------+----------+---------------------+ Prox forearm         0.09                                     +-----------------+-------------+----------+---------------------+ +-----------------+-------------+----------+--------------+ Left Basilic     Diameter (cm)Depth (cm)   Findings    +-----------------+-------------+----------+--------------+ Shoulder                                not visualized +-----------------+-------------+----------+--------------+ Prox upper arm       0.32                     [place]       +-----------------+-------------+----------+--------------+ Mid upper arm        0.14                              +-----------------+-------------+----------+--------------+ Dist upper arm                                [place]       +-----------------+-------------+----------+--------------+ Antecubital fossa    0.18                     [place]       +-----------------+-------------+----------+--------------+ Prox forearm         0.15                              +-----------------+-------------+----------+--------------+ Summary:  Left: Evidence of prior history of superficial vein thrombophlebitis       of the left Basilic and Cephalic veins. No compression of the       cephalic vein from the mid upper arm to the antecubital fossa.       No compression of the basilic vein in the proximal upper arm,       distal upper arm and  antecubital fossa. *See table(s) above for measurements and observations.  Diagnosing physician: Harold Barban MD Electronically signed by Harold Barban MD on 05/08/2018 at 10:47:03 AM.    Final     Time Spent in minutes  [place] M.D on 05/15/2018 at 9:41 AM  To page go to [email or web address] - password [email or web address]

## 2018-05-15 NOTE — Progress Notes (Addendum)
Subjective:  BPs better today. Off nitro drip. Denies CP/SOB. For HD today.   Objective Vital signs in last 24 hours: Vitals:   05/14/18 2006 05/15/18 0026 05/15/18 0428 05/15/18 0757  BP: 128/79 131/89 125/84 (!) 143/82  Pulse: 81 76 80 77  Resp: 17 18 18 15   Temp: 97.9 F (36.6 C)  (!) 97.5 F (36.4 C) 97.7 F (36.5 C)  TempSrc: Oral  Oral Oral  SpO2: 99%  100% 98%  Weight:   58.5 kg   Height:       Weight change: -4.531 kg  General: alert AAF on HD,NAD , appropriate  Lungs: cta  non labored on HD  Abdomen: bs +, soft NTY, ND  Extremities: trace bipdal  Dialysis Access:Left IJ  Perm cath   Dialysis Orders: Center:[facility]  on TTS . EDW 60.5  HD Bath 2k/2.5 ca  Time 3hr 85min  Heparin No hep (hep in  pc ports). L IJ  pcath (for new avgg 05/17/18)     Calcitriol 4 mcg IV/HD      Assessment/Plan 1. Pulmonary edema /vol overload  = HD Sat with net UF 4.4L  post wt below edw 57.4. Below EDW will need lower EDW at discharge. For extra HD today.  2. HTN urgency= ?? Volume related. BP improved with UF. / Meds added in hosp Labetalol 200mg  bid , Hydralazine 50 mg  q 8 hr. Monitor  3. ESRD - TTS.  Extra HD today for volume/BP.  Has TDC  ( plans for New AVGG 05/17/18) 4. Anemia of CKD   - HGB 10.5  No esa at op , monitor hgb  5. Metabolic bone disease -  PO vit d on hd , phos binder  Lynnda Child PA-C [place] Pager [phone] 05/15/2018,11:59 AM  Pt seen, examined and agree w A/P as above.  Kelly Splinter MD [facility] pager [phone]   05/15/2018, 12:58 PM    Labs: Basic Metabolic Panel: Recent Labs  Lab 05/13/18 0046 05/14/18 0337 05/15/18 0223  NA 131* 132* 129*  K 4.2 3.8 4.2  CL 91* 92* 90*  CO2 23 25 25   GLUCOSE 83 105* 82  BUN 20 10 18   CREATININE 5.66* 3.87* 6.11*  CALCIUM 9.9 9.3 9.7   Liver Function Tests: Recent Labs  Lab 05/13/18 0046  AST 43*  ALT 34  ALKPHOS 119  BILITOT 1.0  PROT 7.3  ALBUMIN 3.5    No results for input(s): LIPASE, AMYLASE in the last 168 hours. No results for input(s): AMMONIA in the last 168 hours. CBC: Recent Labs  Lab 05/13/18 0046  WBC 6.4  NEUTROABS 4.6  HGB 10.5*  HCT 32.0*  MCV 88.9  PLT 139*   Cardiac Enzymes: Recent Labs  Lab 05/13/18 0046 05/13/18 0539 05/13/18 1128 05/13/18 1806  TROPONINI 0.06* 0.06* 0.06* 0.08*   CBG: No results for input(s): GLUCAP in the last 168 hours.  Studies/Results: No results found. Medications: . sodium chloride    . sodium chloride    . nitroGLYCERIN Stopped (05/14/18 2234)   . amLODipine  10 mg Oral Daily  . calcium acetate  1,334 mg Oral TID WC  . Chlorhexidine Gluconate Cloth  6 each Topical Q0600  . cloNIDine  0.1 mg Oral TID  . heparin  3,000 Units Dialysis Q T,Th,Sa-HD  . heparin  5,000 Units Subcutaneous Q8H  . hydrALAZINE  50 mg Oral Q8H  . labetalol  200 mg Oral BID  . pantoprazole  20  mg Oral BID AC

## 2018-05-16 ENCOUNTER — Encounter (HOSPITAL_COMMUNITY): Payer: Self-pay | Admitting: *Deleted

## 2018-05-16 ENCOUNTER — Other Ambulatory Visit: Payer: Self-pay

## 2018-05-16 DIAGNOSIS — D631 Anemia in chronic kidney disease: Secondary | ICD-10-CM | POA: Diagnosis not present

## 2018-05-16 DIAGNOSIS — D689 Coagulation defect, unspecified: Secondary | ICD-10-CM | POA: Diagnosis not present

## 2018-05-16 DIAGNOSIS — N2581 Secondary hyperparathyroidism of renal origin: Secondary | ICD-10-CM | POA: Diagnosis not present

## 2018-05-16 DIAGNOSIS — Z992 Dependence on renal dialysis: Secondary | ICD-10-CM | POA: Diagnosis not present

## 2018-05-16 DIAGNOSIS — N186 End stage renal disease: Secondary | ICD-10-CM | POA: Diagnosis not present

## 2018-05-16 LAB — BASIC METABOLIC PANEL
Anion gap: 11 (ref 5–15)
BUN: 11 mg/dL (ref 6–20)
CO2: 26 mmol/L (ref 22–32)
Calcium: 8.9 mg/dL (ref 8.9–10.3)
Chloride: 97 mmol/L — ABNORMAL LOW (ref 98–111)
Creatinine, Ser: 4.06 mg/dL — ABNORMAL HIGH (ref 0.44–1.00)
GFR calc Af Amer: 13 mL/min — ABNORMAL LOW (ref >60)
GFR calc non Af Amer: 11 mL/min — ABNORMAL LOW (ref >60)
Glucose, Bld: 87 mg/dL (ref 70–99)
Potassium: 4.8 mmol/L (ref 3.5–5.1)
Sodium: 134 mmol/L — ABNORMAL LOW (ref 135–145)

## 2018-05-16 MED ORDER — LABETALOL HCL 200 MG PO TABS: 200.0000 mg | ORAL_TABLET | Freq: Two times a day (BID) | ORAL | 0 refills | Status: DC

## 2018-05-16 MED ORDER — AMLODIPINE BESYLATE 10 MG PO TABS: 10.0000 mg | ORAL_TABLET | Freq: Every day | ORAL | 0 refills | Status: DC

## 2018-05-16 MED ORDER — CLONIDINE HCL 0.1 MG PO TABS: 0.1000 mg | ORAL_TABLET | Freq: Three times a day (TID) | ORAL | 0 refills | Status: DC

## 2018-05-16 NOTE — Discharge Summary (Signed)
Karen Morrow:[id number] DOB: February 04, 1961 DOA: 05/13/2018  PCP: [name], MD  Admit date: 05/13/2018  Discharge date: 05/16/2018  Admitted From: Home   Disposition:  Home   Recommendations for Outpatient Follow-up:   Follow up with PCP in 1-2 weeks  PCP Please obtain BMP/CBC, 2 view CXR in 1week,  (see Discharge instructions)   PCP Please follow up on the following pending results:    Home Health: None   Equipment/Devices: None  Consultations: Renal Discharge Condition: Stable   CODE STATUS: Full   Diet Recommendation: Renal with 1.2 L/day total fluid restriction    Chief Complaint  Patient presents with  . Shortness of Breath     Brief history of present illness from the day of admission and additional interim summary    57 year old African-American female with history of ESRD on Tuesday Thursday Saturday schedule, recent admission for MSSA bacteremia she has finished her antibiotic course, there was clinical suspicion of endocarditis but she could never undergo TEE successfully due to health issues.  Patient has been having longstanding history of poorly controlled blood pressure with multiple ER visits suggesting extremely high blood pressures in the range of 924M systolic to 628 diastolic.  Apparently patient went for dialysis the day before admission, dialysis could not be completed due to very high blood pressures and shortness of breath and she was sent to the ER.  In the ER she was diagnosed with acute on chronic diastolic CHF, fluid overload, borderline troponin, hypertensive crisis, ER consulted nephrology and requested [place] to admit the patient.                                                                 Hospital Course      1.  Hypertensive urgency/crisis causing acute on  chronic diastolic CHF with pulmonary edema and hypoxic respiratory failure.  Patient able compliance with blood pressure medications at home, he required nitro drip which has been titrated off early morning 05/15/2018, oral medications have been aggressively adjusted and she is now on high-dose Norvasc, labetalol and Catapres stable BP and now symptom free.  Repeat echo appears stable.  Will be discharged today as she has a pending appointment at her dialysis center at 10:00 for dialysis.  Request  PCP to monitor blood pressure and adjust medications as needed.  2.  Acute on chronic diastolic CHF EF appears 55 to 60% on recent echo.  Fluid removal through HD now close to being compensated.  3.  Borderline elevated troponin.  Trend is flat and and non-ACS pattern, no chest pain, this is due to mild demand ischemia brought on by #1 above in a ESRD patient.  No further work-up.  4.  ESRD.  On Tuesday, Thursday and Saturday schedule.  Nephrology consulted will be dialyzed  as needed and per schedule.  5.  Recent MSSA bacteremia.  Finished antibiotics currently stable will monitor clinically.  6..  On PPI.   Discharge diagnosis     Principal Problem:   Hypertensive urgency Active Problems:   HTN (hypertension)   ESRD (end stage renal disease) [facility])    Discharge instructions    Discharge Instructions    Discharge instructions   Complete by:  As directed    Follow with Primary MD [name], MD in 7 days   Get CBC, CMP, 2 view Chest X ray -  checked  by Primary MD  in 5-7 days    Activity: As tolerated with Full fall precautions use walker/cane & assistance as needed  Disposition Home   Diet:  Renal, 1.2 Lit/day fluid restriction  Special Instructions: If you have smoked or chewed Tobacco  in the last 2 yrs please stop smoking, stop any regular Alcohol  and or any Recreational drug use.  On your next visit with your primary care physician please Get Medicines reviewed  and adjusted.  Please request your Prim.MD to go over all Hospital Tests and Procedure/Radiological results at the follow up, please get all Hospital records sent to your Prim MD by signing hospital release before you go home.  If you experience worsening of your admission symptoms, develop shortness of breath, life threatening emergency, suicidal or homicidal thoughts you must seek medical attention immediately by calling 911 or calling your MD immediately  if symptoms less severe.  You Must read complete instructions/literature along with all the possible adverse reactions/side effects for all the Medicines you take and that have been prescribed to you. Take any new Medicines after you have completely understood and accpet all the possible adverse reactions/side effects.   Increase activity slowly   Complete by:  As directed       Discharge Medications   Allergies as of 05/16/2018      Reactions   Cefazolin Other (See Comments)   Severe thrombocytopenia (has tolerated zosyn in the past) ID aware      Medication List    STOP taking these medications   Miconazole Nitrate Applicator 784 & 2 MG-% (9GM) Kit Commonly known as:  MONISTAT 7 COMBO PACK APP   nicotine polacrilex 4 MG gum Commonly known as:  NICORETTE   oxyCODONE-acetaminophen 5-325 MG tablet Commonly known as:  PERCOCET/ROXICET     TAKE these medications   albuterol (2.5 MG/3ML) 0.083% nebulizer solution Commonly known as:  PROVENTIL Take 3 mLs (2.5 mg total) by nebulization every 6 (six) hours as needed for wheezing or shortness of breath. What changed:  Another medication with the same name was changed. Make sure you understand how and when to take each.   albuterol 108 (90 Base) MCG/ACT inhaler Commonly known as:  PROVENTIL HFA;VENTOLIN HFA TAKE 2 PUFFS BY MOUTH EVERY 6 HOURS AS NEEDED FOR WHEEZE OR SHORTNESS OF BREATH What changed:  See the new instructions.   amLODipine 10 MG tablet Commonly known as:   NORVASC Take 1 tablet (10 mg total) by mouth daily.   calcium acetate 667 MG capsule Commonly known as:  PHOSLO Take 1,334 mg by mouth 3 (three) times daily with meals.   cloNIDine 0.1 MG tablet Commonly known as:  CATAPRES Take 1 tablet (0.1 mg total) by mouth 3 (three) times daily.   labetalol 200 MG tablet Commonly known as:  NORMODYNE Take 1 tablet (200 mg total) by mouth 2 (two) times daily.  pantoprazole 20 MG tablet Commonly known as:  PROTONIX Take 1 tablet (20 mg total) by mouth 2 (two) times daily before a meal.       Follow-up Information    [name], MD. Schedule an appointment as soon as possible for a visit in 1 week(s).   Specialty:  Internal Medicine Contact information: [place] [address] [phone]           Major procedures and Radiology Reports - PLEASE review detailed and final reports thoroughly  -        Dg Chest 2 View  Result Date: 05/13/2018 CLINICAL DATA:  Shortness of breath for 2 days, nausea. EXAM: CHEST - 2 VIEW COMPARISON:  Chest radiograph March 16, 2018 FINDINGS: Moderate cardiomegaly. Calcified aortic arch. Pulmonary vascular congestion and mild interstitial prominence. Small RIGHT pleural effusion. Tunneled dialysis catheter via LEFT internal jugular venous approach, distal tips projecting distal superior vena cava. No pneumothorax. Multiple vascular clips RIGHT arm. IMPRESSION: 1. Cardiomegaly and interstitial prominence likely reflecting pulmonary edema. Small RIGHT pleural effusion. 2.  Aortic Atherosclerosis (ICD10-I70.0). Electronically Signed   By: Elon Alas M.D.   On: 05/13/2018 01:36   Vas [place] Upper Ext Vein Mapping (pre-op Avf)  Result Date: 05/08/2018 UPPER EXTREMITY VEIN MAPPING  Indications: Pre-access. Performing Technologist: Alvia Grove RVT  Examination Guidelines: A complete evaluation includes B-mode imaging, spectral Doppler, color Doppler, and power Doppler as needed of all  accessible portions of each vessel. Bilateral testing is considered an integral part of a complete examination. Limited examinations for reoccurring indications may be performed as noted. +-----------------+-------------+----------+---------------------+ Left Cephalic    Diameter (cm)Depth (cm)      Findings        +-----------------+-------------+----------+---------------------+ Mid upper arm                                    [place]           +-----------------+-------------+----------+---------------------+ Dist upper arm                          not visualized and [place] +-----------------+-------------+----------+---------------------+ Antecubital fossa    0.12                        [place]           +-----------------+-------------+----------+---------------------+ Prox forearm         0.09                                     +-----------------+-------------+----------+---------------------+ +-----------------+-------------+----------+--------------+ Left Basilic     Diameter (cm)Depth (cm)   Findings    +-----------------+-------------+----------+--------------+ Shoulder                                not visualized +-----------------+-------------+----------+--------------+ Prox upper arm       0.32                     [place]       +-----------------+-------------+----------+--------------+ Mid upper arm        0.14                              +-----------------+-------------+----------+--------------+  Dist upper arm                                [place]       +-----------------+-------------+----------+--------------+ Antecubital fossa    0.18                     [place]       +-----------------+-------------+----------+--------------+ Prox forearm         0.15                              +-----------------+-------------+----------+--------------+ Summary:  Left: Evidence of prior history of superficial vein thrombophlebitis       of the left Basilic and Cephalic veins.  No compression of the       cephalic vein from the mid upper arm to the antecubital fossa.       No compression of the basilic vein in the proximal upper arm,       distal upper arm and antecubital fossa. *See table(s) above for measurements and observations.  Diagnosing physician: Harold Barban MD Electronically signed by Harold Barban MD on 05/08/2018 at 10:47:03 AM.    Final     Micro Results    Recent Results (from the past 240 hour(s))  MRSA PCR Screening     Status: None   Collection Time: 05/14/18  7:43 AM  Result Value Ref Range Status   MRSA by PCR NEGATIVE NEGATIVE Final    Comment:        The GeneXpert MRSA Assay (FDA approved for NASAL specimens only), is one component of a comprehensive MRSA colonization surveillance program. It is not intended to diagnose MRSA infection nor to guide or monitor treatment for MRSA infections. Performed at [facility], [place] [address] [place], [place] [address]     Today   Subjective    Karen Morrow today has no headache,no chest abdominal pain,no new weakness tingling or numbness, feels much better wants to go home today.    Objective   Blood pressure (!) 141/77, pulse 73, temperature (!) 97.4 F (36.3 C), temperature source Oral, resp. rate 20, height 5' 7" (1.702 m), weight 56.8 kg, SpO2 100 %.   Intake/Output Summary (Last 24 hours) at 05/16/2018 0739 Last data filed at 05/16/2018 0320 Gross per 24 hour  Intake 960 ml  Output 2016 ml  Net -1056 ml    Exam  Awake Alert, Oriented x 3, No new F.N deficits, Normal affect [place].AT,PERRAL Supple Neck,No JVD, No cervical lymphadenopathy appriciated.  Symmetrical Chest wall movement, Good air movement bilaterally, CTAB RRR,No Gallops,Rubs or new Murmurs, No Parasternal Heave +ve B.Sounds, Abd Soft, Non tender, No organomegaly appriciated, No rebound -guarding or rigidity. No Cyanosis, Clubbing or edema, No new Rash or bruise   Data Review   CBC w Diff:  Lab  Results  Component Value Date   WBC 3.8 (L) 05/15/2018   HGB 9.2 (L) 05/15/2018   HGB 11.0 (L) 10/15/2016   HCT 27.3 (L) 05/15/2018   HCT 32.1 (L) 10/15/2016   PLT 125 (L) 05/15/2018   PLT 179 10/15/2016   LYMPHOPCT 17 05/13/2018   MONOPCT 7 05/13/2018   EOSPCT 3 05/13/2018   BASOPCT 1 05/13/2018    CMP:  Lab Results  Component Value Date   NA 134 (L) 05/16/2018   NA 134 06/17/2017   K 4.8 05/16/2018  CL 97 (L) 05/16/2018   CO2 26 05/16/2018   BUN 11 05/16/2018   BUN 25 (H) 06/17/2017   CREATININE 4.06 (H) 05/16/2018   CREATININE 3.61 (H) 08/09/2013   PROT 7.3 05/13/2018   PROT 7.7 06/17/2017   ALBUMIN 3.5 05/13/2018   ALBUMIN 4.4 06/17/2017   BILITOT 1.0 05/13/2018   BILITOT 0.3 06/17/2017   ALKPHOS 119 05/13/2018   AST 43 (H) 05/13/2018   ALT 34 05/13/2018  .   Total Time in preparing paper work, data evaluation and todays exam - 29 minutes  Lala Lund M.D on 05/16/2018 at 7:39 AM  [facility]   Office  [phone]

## 2018-05-16 NOTE — Anesthesia Preprocedure Evaluation (Addendum)
Anesthesia Evaluation  Patient identified by MRN, date of birth, ID band Patient awake    Reviewed: Allergy & Precautions, NPO status , Patient's Chart, lab work & pertinent test results, reviewed documented beta blocker date and time   History of Anesthesia Complications Negative for: history of anesthetic complications  Airway Mallampati: III  TM Distance: >3 FB Neck ROM: Full    Dental  (+) Dental Advisory Given, Chipped, Poor Dentition   Pulmonary asthma , Current Smoker,    breath sounds clear to auscultation       Cardiovascular hypertension, Pt. on medications and Pt. on home beta blockers (-) angina+CHF   Rhythm:Regular Rate:Normal + Systolic murmurs  '19 TTE - Moderate to severe LVH. EF 55% to 60%. Grade 1 diastolic dysfunction. Mild-mod AI. Trivial TR. LA was moderately dilated. Trivial TR. A trivial pericardial effusion was identified posterior to the heart.   Neuro/Psych negative neurological ROS  negative psych ROS   GI/Hepatic PUD, GERD  Medicated and Controlled,(+) Cirrhosis     substance abuse  marijuana use, Hepatitis -, C  Endo/Other  negative endocrine ROS  Renal/GU ESRF and DialysisRenal disease     Musculoskeletal negative musculoskeletal ROS (+)   Abdominal   Peds  Hematology  (+) anemia ,  Thrombocytopenia    Anesthesia Other Findings   Reproductive/Obstetrics                            Anesthesia Physical Anesthesia Plan  ASA: III  Anesthesia Plan: General   Post-op Pain Management:    Induction: Intravenous  PONV Risk Score and Plan: 3 and Treatment may vary due to age or medical condition, Ondansetron and Dexamethasone  Airway Management Planned: LMA  Additional Equipment: None  Intra-op Plan:   Post-operative Plan: Extubation in OR  Informed Consent: I have reviewed the patients History and Physical, chart, labs and discussed the procedure  including the risks, benefits and alternatives for the proposed anesthesia with the patient or authorized representative who has indicated his/her understanding and acceptance.   Dental advisory given  Plan Discussed with: CRNA and Anesthesiologist  Anesthesia Plan Comments:       Anesthesia Quick Evaluation

## 2018-05-16 NOTE — Progress Notes (Signed)
Pt SDW-pre-op call completed by pt spouse, Yehuda Savannah (DPR). Spouse stated that he is unsure if pt had a stress test and cardiac cath ( please assess on  DOS). Spouse made aware to have pt stop taking vitamins, fish oil and herbal medications. Do not take any NSAIDs ie: Ibuprofen, Advil, Naproxen (Aleve), Motrin, BC and Goody Powder. Spouse verbalized understanding of all pre-op instructions.

## 2018-05-16 NOTE — Discharge Instructions (Signed)
Follow with Primary MD [name], MD in 7 days   Get CBC, CMP, 2 view Chest X ray -  checked  by Primary MD  in 5-7 days    Activity: As tolerated with Full fall precautions use walker/cane & assistance as needed  Disposition Home   Diet:  Renal, 1.2 Lit/day fluid restriction  Special Instructions: If you have smoked or chewed Tobacco  in the last 2 yrs please stop smoking, stop any regular Alcohol  and or any Recreational drug use.  On your next visit with your primary care physician please Get Medicines reviewed and adjusted.  Please request your Prim.MD to go over all Hospital Tests and Procedure/Radiological results at the follow up, please get all Hospital records sent to your Prim MD by signing hospital release before you go home.  If you experience worsening of your admission symptoms, develop shortness of breath, life threatening emergency, suicidal or homicidal thoughts you must seek medical attention immediately by calling 911 or calling your MD immediately  if symptoms less severe.  You Must read complete instructions/literature along with all the possible adverse reactions/side effects for all the Medicines you take and that have been prescribed to you. Take any new Medicines after you have completely understood and accpet all the possible adverse reactions/side effects.

## 2018-05-17 ENCOUNTER — Telehealth: Payer: Self-pay | Admitting: Vascular Surgery

## 2018-05-17 ENCOUNTER — Encounter (HOSPITAL_COMMUNITY): Payer: Self-pay

## 2018-05-17 ENCOUNTER — Ambulatory Visit (HOSPITAL_COMMUNITY): Payer: Medicare Other | Admitting: Physician Assistant

## 2018-05-17 ENCOUNTER — Encounter (HOSPITAL_COMMUNITY)
Admission: RE | Disposition: A | Discharge status: Home / Self Care | Payer: Self-pay | Source: Home / Self Care | Attending: Vascular Surgery

## 2018-05-17 ENCOUNTER — Ambulatory Visit (HOSPITAL_COMMUNITY)
Admission: RE | Admit: 2018-05-17 | Discharge: 2018-05-17 | Disposition: A | Discharge status: Home / Self Care | Payer: Medicare Other | Attending: Vascular Surgery | Admitting: Vascular Surgery

## 2018-05-17 DIAGNOSIS — I132 Hypertensive heart and chronic kidney disease with heart failure and with stage 5 chronic kidney disease, or end stage renal disease: Secondary | ICD-10-CM | POA: Diagnosis not present

## 2018-05-17 DIAGNOSIS — K219 Gastro-esophageal reflux disease without esophagitis: Secondary | ICD-10-CM | POA: Insufficient documentation

## 2018-05-17 DIAGNOSIS — F17200 Nicotine dependence, unspecified, uncomplicated: Secondary | ICD-10-CM | POA: Diagnosis not present

## 2018-05-17 DIAGNOSIS — Z992 Dependence on renal dialysis: Secondary | ICD-10-CM | POA: Diagnosis not present

## 2018-05-17 DIAGNOSIS — N185 Chronic kidney disease, stage 5: Secondary | ICD-10-CM | POA: Diagnosis not present

## 2018-05-17 DIAGNOSIS — Z79899 Other long term (current) drug therapy: Secondary | ICD-10-CM | POA: Insufficient documentation

## 2018-05-17 DIAGNOSIS — I509 Heart failure, unspecified: Secondary | ICD-10-CM | POA: Diagnosis not present

## 2018-05-17 DIAGNOSIS — N186 End stage renal disease: Secondary | ICD-10-CM | POA: Insufficient documentation

## 2018-05-17 DIAGNOSIS — J45909 Unspecified asthma, uncomplicated: Secondary | ICD-10-CM | POA: Insufficient documentation

## 2018-05-17 HISTORY — DX: Heart failure, unspecified: I50.9

## 2018-05-17 HISTORY — PX: AV FISTULA PLACEMENT: SHX1204

## 2018-05-17 LAB — POCT I-STAT 4, (NA,K, GLUC, HGB,HCT)
Glucose, Bld: 82 mg/dL (ref 70–99)
HCT: 33 % — ABNORMAL LOW (ref 36.0–46.0)
Hemoglobin: 11.2 g/dL — ABNORMAL LOW (ref 12.0–15.0)
Potassium: 3.9 mmol/L (ref 3.5–5.1)
Sodium: 132 mmol/L — ABNORMAL LOW (ref 135–145)

## 2018-05-17 SURGERY — INSERTION OF ARTERIOVENOUS (AV) GORE-TEX GRAFT ARM
Anesthesia: Monitor Anesthesia Care | Site: Arm Upper | Laterality: Left

## 2018-05-17 MED ORDER — FENTANYL CITRATE (PF) 250 MCG/5ML IJ SOLN
INTRAMUSCULAR | Status: AC
  Filled 2018-05-17: qty 5

## 2018-05-17 MED ORDER — ONDANSETRON HCL 4 MG/2ML IJ SOLN: 4.0000 mg | Freq: Once | INTRAMUSCULAR | Status: DC | PRN

## 2018-05-17 MED ORDER — OXYCODONE HCL 5 MG/5ML PO SOLN: 5.0000 mg | Freq: Once | ORAL | Status: DC | PRN

## 2018-05-17 MED ORDER — PROPOFOL 10 MG/ML IV BOLUS
INTRAVENOUS | Status: DC | PRN
  Administered 2018-05-17: 90 mg via INTRAVENOUS
  Administered 2018-05-17: 30 mg via INTRAVENOUS

## 2018-05-17 MED ORDER — SODIUM CHLORIDE 0.9 % IV SOLN
INTRAVENOUS | Status: DC
  Administered 2018-05-17: 08:00:00 via INTRAVENOUS

## 2018-05-17 MED ORDER — VANCOMYCIN HCL 1000 MG IV SOLR
INTRAVENOUS | Status: DC | PRN
  Administered 2018-05-17: 1000 mg via INTRAVENOUS

## 2018-05-17 MED ORDER — ONDANSETRON HCL 4 MG/2ML IJ SOLN
INTRAMUSCULAR | Status: DC | PRN
  Administered 2018-05-17: 4 mg via INTRAVENOUS

## 2018-05-17 MED ORDER — PROPOFOL 1000 MG/100ML IV EMUL
INTRAVENOUS | Status: AC
  Filled 2018-05-17: qty 100

## 2018-05-17 MED ORDER — FENTANYL CITRATE (PF) 250 MCG/5ML IJ SOLN
INTRAMUSCULAR | Status: DC | PRN
  Administered 2018-05-17 (×3): 50 ug via INTRAVENOUS

## 2018-05-17 MED ORDER — CHLORHEXIDINE GLUCONATE 4 % EX LIQD: 1.0000 "application " | Freq: Once | CUTANEOUS | Status: DC

## 2018-05-17 MED ORDER — OXYCODONE HCL 5 MG PO TABS: 5.0000 mg | ORAL_TABLET | Freq: Once | ORAL | Status: DC | PRN

## 2018-05-17 MED ORDER — LIDOCAINE-EPINEPHRINE 1 %-1:100000 IJ SOLN
INTRAMUSCULAR | Status: AC
  Filled 2018-05-17: qty 1

## 2018-05-17 MED ORDER — OXYCODONE-ACETAMINOPHEN 5-325 MG PO TABS: 1.0000 | ORAL_TABLET | Freq: Four times a day (QID) | ORAL | 0 refills | Status: DC | PRN

## 2018-05-17 MED ORDER — ONDANSETRON HCL 4 MG/2ML IJ SOLN
INTRAMUSCULAR | Status: AC
  Filled 2018-05-17: qty 2

## 2018-05-17 MED ORDER — LIDOCAINE HCL (PF) 1 % IJ SOLN
INTRAMUSCULAR | Status: AC
  Filled 2018-05-17: qty 30

## 2018-05-17 MED ORDER — LIDOCAINE 2% (20 MG/ML) 5 ML SYRINGE
INTRAMUSCULAR | Status: AC
  Filled 2018-05-17: qty 5

## 2018-05-17 MED ORDER — PHENYLEPHRINE HCL 10 MG/ML IJ SOLN
INTRAMUSCULAR | Status: DC | PRN
  Administered 2018-05-17 (×3): 40 ug via INTRAVENOUS
  Administered 2018-05-17: 80 ug via INTRAVENOUS

## 2018-05-17 MED ORDER — FENTANYL CITRATE (PF) 100 MCG/2ML IJ SOLN: 25.0000 ug | INTRAMUSCULAR | Status: DC | PRN

## 2018-05-17 MED ORDER — SODIUM CHLORIDE 0.9 % IV SOLN
INTRAVENOUS | Status: AC
  Filled 2018-05-17: qty 1.2

## 2018-05-17 MED ORDER — PROPOFOL 10 MG/ML IV BOLUS
INTRAVENOUS | Status: AC
  Filled 2018-05-17: qty 20

## 2018-05-17 MED ORDER — MIDAZOLAM HCL 2 MG/2ML IJ SOLN
INTRAMUSCULAR | Status: AC
  Filled 2018-05-17: qty 2

## 2018-05-17 MED ORDER — LIDOCAINE HCL (CARDIAC) PF 100 MG/5ML IV SOSY
PREFILLED_SYRINGE | INTRAVENOUS | Status: DC | PRN
  Administered 2018-05-17: 60 mg via INTRAVENOUS

## 2018-05-17 MED ORDER — MIDAZOLAM HCL 5 MG/5ML IJ SOLN
INTRAMUSCULAR | Status: DC | PRN
  Administered 2018-05-17: 2 mg via INTRAVENOUS

## 2018-05-17 MED ORDER — DEXAMETHASONE SODIUM PHOSPHATE 10 MG/ML IJ SOLN
INTRAMUSCULAR | Status: DC | PRN
  Administered 2018-05-17: 10 mg via INTRAVENOUS

## 2018-05-17 MED ORDER — VANCOMYCIN HCL IN DEXTROSE 1-5 GM/200ML-% IV SOLN
1000.0000 mg | INTRAVENOUS | Status: DC
  Filled 2018-05-17: qty 200

## 2018-05-17 MED ORDER — DEXAMETHASONE SODIUM PHOSPHATE 10 MG/ML IJ SOLN
INTRAMUSCULAR | Status: AC
  Filled 2018-05-17: qty 1

## 2018-05-17 MED ORDER — SODIUM CHLORIDE 0.9 % IV SOLN
INTRAVENOUS | Status: DC | PRN
  Administered 2018-05-17: 500 mL

## 2018-05-17 MED ORDER — 0.9 % SODIUM CHLORIDE (POUR BTL) OPTIME
TOPICAL | Status: DC | PRN
  Administered 2018-05-17: 1000 mL

## 2018-05-17 SURGICAL SUPPLY — 30 items
ARMBAND PINK RESTRICT EXTREMIT (MISCELLANEOUS) ×2 IMPLANT
CANISTER SUCT 3000ML PPV (MISCELLANEOUS) ×2 IMPLANT
CLIP VESOCCLUDE MED 6/CT (CLIP) ×2 IMPLANT
CLIP VESOCCLUDE SM WIDE 6/CT (CLIP) ×2 IMPLANT
COVER WAND RF STERILE (DRAPES) ×2 IMPLANT
DERMABOND ADVANCED (GAUZE/BANDAGES/DRESSINGS) ×1
DERMABOND ADVANCED .7 DNX12 (GAUZE/BANDAGES/DRESSINGS) ×1 IMPLANT
ELECT REM PT RETURN 9FT ADLT (ELECTROSURGICAL) ×2
ELECTRODE REM PT RTRN 9FT ADLT (ELECTROSURGICAL) ×1 IMPLANT
GLOVE BIO SURGEON STRL SZ7.5 (GLOVE) ×2 IMPLANT
GOWN STRL REUS W/ TWL LRG LVL3 (GOWN DISPOSABLE) ×2 IMPLANT
GOWN STRL REUS W/ TWL XL LVL3 (GOWN DISPOSABLE) ×1 IMPLANT
GOWN STRL REUS W/TWL LRG LVL3 (GOWN DISPOSABLE) ×2
GOWN STRL REUS W/TWL XL LVL3 (GOWN DISPOSABLE) ×1
HEMOSTAT SNOW SURGICEL 2X4 (HEMOSTASIS) IMPLANT
INSERT FOGARTY SM (MISCELLANEOUS) ×2 IMPLANT
KIT BASIN OR (CUSTOM PROCEDURE TRAY) ×2 IMPLANT
KIT TURNOVER KIT B (KITS) ×2 IMPLANT
NS IRRIG 1000ML POUR BTL (IV SOLUTION) ×2 IMPLANT
PACK CV ACCESS (CUSTOM PROCEDURE TRAY) ×2 IMPLANT
PAD ARMBOARD 7.5X6 YLW CONV (MISCELLANEOUS) ×4 IMPLANT
SUT MNCRL AB 4-0 PS2 18 (SUTURE) ×4 IMPLANT
SUT PROLENE 6 0 BV (SUTURE) ×2 IMPLANT
SUT SILK 2 0 SH (SUTURE) ×2 IMPLANT
SUT VIC AB 3-0 SH 27 (SUTURE) ×2
SUT VIC AB 3-0 SH 27X BRD (SUTURE) ×2 IMPLANT
SYR TOOMEY 50ML (SYRINGE) IMPLANT
TOWEL GREEN STERILE (TOWEL DISPOSABLE) ×2 IMPLANT
UNDERPAD 30X30 (UNDERPADS AND DIAPERS) ×2 IMPLANT
WATER STERILE IRR 1000ML POUR (IV SOLUTION) ×2 IMPLANT

## 2018-05-17 NOTE — Anesthesia Procedure Notes (Signed)
Procedure Name: LMA Insertion Date/Time: 05/17/2018 8:47 AM Performed by: Teressa Lower., CRNA Pre-anesthesia Checklist: Patient identified, Emergency Drugs available, Suction available, Patient being monitored and Timeout performed Patient Re-evaluated:Patient Re-evaluated prior to induction Oxygen Delivery Method: Circle system utilized Preoxygenation: Pre-oxygenation with 100% oxygen Induction Type: IV induction LMA: LMA inserted LMA Size: 4.0 Number of attempts: 1 Placement Confirmation: positive ETCO2 and breath sounds checked- equal and bilateral Tube secured with: Tape Dental Injury: Teeth and Oropharynx as per pre-operative assessment

## 2018-05-17 NOTE — Transfer of Care (Signed)
Immediate Anesthesia Transfer of Care Note  Patient: Karen Morrow  Procedure(s) Performed: CREATION OF BRACHIOCEPHALIC FISTULA LEFT ARM (Left Arm Upper)  Patient Location: PACU  Anesthesia Type:General  Level of Consciousness: drowsy  Airway & Oxygen Therapy: Patient Spontanous Breathing and Patient connected to face mask oxygen  Post-op Assessment: Report given to RN and Post -op Vital signs reviewed and stable  Post vital signs: Reviewed and stable  Last Vitals:  Vitals Value Taken Time  BP 93/67 05/17/2018  9:25 AM  Temp    Pulse 59 05/17/2018  9:27 AM  Resp 15 05/17/2018  9:27 AM  SpO2 100 % 05/17/2018  9:27 AM  Vitals shown include unvalidated device data.  Last Pain:  Vitals:   05/17/18 0646  TempSrc: Oral  PainSc: 0-No pain         Complications: No apparent anesthesia complications

## 2018-05-17 NOTE — Telephone Encounter (Signed)
-----   Message from Mena Goes, RN sent at 05/17/2018  9:28 AM EST ----- Regarding: 6 weeks in PA clinic with dupelx  ----- Message ----- From: Gabriel Earing, PA-C Sent: 05/17/2018   9:12 AM EST To: [facility] Admin Pool, [facility] Charge Pool  S/p left BC AVF 05/17/18.  F/u in PA clinic on Dr. Claretha Cooper clinic day with duplex in 6 weeks.  Thanks

## 2018-05-17 NOTE — H&P (Signed)
   History and Physical Update  The patient was interviewed and re-examined.  The patient's previous History and Physical has been reviewed and is unchanged from recent office visit. Plan for left arm avf vs avg.   Cavin Longman C. Donzetta Matters, MD [facility] Office: [phone] Pager: [phone]   05/17/2018, 7:25 AM

## 2018-05-17 NOTE — Op Note (Addendum)
    Patient name: Karen Morrow MRN: [id number] DOB: 12/13/1960 Sex: female  05/17/2018 Pre-operative Diagnosis: End-stage renal disease Post-operative diagnosis:  Same Surgeon:  Erlene Quan C. Donzetta Matters, MD Assistant: Leontine Locket, PA Procedure Performed: Left arm brachiocephalic AV fistula creation  Indications: 57 year old female with history end-stage renal disease.  She previously had an infected right upper arm AV graft that was excised and she is on dialysis via catheter at this time.  She is now indicated for permanent left upper extremity access.  Findings: Cephalic vein measured approximately 5 mm in the antecubitum 4.1 mm higher in the arm by on table ultrasound evaluation.  I do not identify any significant stenosis.  At completion there was a strong thrill with minimal pulsatility in the runoff vein confirmed with Doppler as well as palpable radial pulse also confirmed with Doppler.   Procedure:  The patient was identified in the holding area and taken to the operating room where she is placed upon the operative table LMA anesthesia was induced.  She was sterilely prepped draped left upper extremity usual fashion antibiotics were administered and a timeout was called.  Ultrasound was used to evaluate the cephalic vein that was visible grossly at the antecubitum.  It did branch the antecubitum higher in the arm it was approximately 4.1 mm at the antecubitum 5 mm just prior to branching.  There is a strong palpable brachial artery that did not appear diseased.  Transverse incision was made above the antecubitum.  I first checked out the vein marked for orientation.  I then dissected deeper to the brachial artery placed a vessel loop around this.  The vein was then transected distally dilated to 3.5 mm flushed with heparinized saline.  The artery was then clamped distally proximally opened longitudinally flushed heparinized saline both directions.  The vein was sewn into side of the artery 6-0  Prolene suture.  Prior to completion anastomosis flushing was performed in all directions.  At completion there was a strong thrill in the runoff vein with minimal pulsatility confirmed with Doppler.  Doppler confirmed arterial signal at the wrist did augment with compression of the vein.  Satisfied with this we obtained hemostasis closed in 2 layers with Vicryl and Monocryl.  She was then away from anesthesia having tolerated procedure well without immediate complication.  All counts were correct at completion.  EBL 10 cc  Billyjoe Go C. Donzetta Matters, MD [facility] [place] [place] Office: [phone] Pager: [phone]

## 2018-05-17 NOTE — Telephone Encounter (Signed)
sch appt lvm mld ltr 1/31/200 1pm Dialysis duplex 2pm p/o PA

## 2018-05-17 NOTE — Anesthesia Postprocedure Evaluation (Signed)
Anesthesia Post Note  Patient: Karen Morrow  Procedure(s) Performed: CREATION OF BRACHIOCEPHALIC FISTULA LEFT ARM (Left Arm Upper)     Patient location during evaluation: PACU Anesthesia Type: General Level of consciousness: awake and alert Pain management: pain level controlled Vital Signs Assessment: post-procedure vital signs reviewed and stable Respiratory status: spontaneous breathing, nonlabored ventilation and respiratory function stable Cardiovascular status: blood pressure returned to baseline and stable Postop Assessment: no apparent nausea or vomiting Anesthetic complications: no    Last Vitals:  Vitals:   05/17/18 1010 05/17/18 1030  BP: 108/68 133/79  Pulse: 65   Resp: 15   Temp: 36.5 C   SpO2: 96% 100%    Last Pain:  Vitals:   05/17/18 1030  TempSrc:   PainSc: 0-No pain                 Audry Pili

## 2018-05-17 NOTE — Discharge Instructions (Signed)
° °  [facility]  Discharge Instructions  AV Fistula or Graft Surgery for Dialysis Access  Please refer to the following instructions for your post-procedure care. Your surgeon or physician assistant will discuss any changes with you.  Activity  You may drive the day following your surgery, if you are comfortable and no longer taking prescription pain medication. Resume full activity as the soreness in your incision resolves.  Bathing/Showering  You may shower after you go home. Keep your incision dry for 48 hours. Do not soak in a bathtub, hot tub, or swim until the incision heals completely. You may not shower if you have a hemodialysis catheter.  Incision Care  Clean your incision with mild soap and water after 48 hours. Pat the area dry with a clean towel. You do not need a bandage unless otherwise instructed. Do not apply any ointments or creams to your incision. You may have skin glue on your incision. Do not peel it off. It will come off on its own in about one week. Your arm may swell a bit after surgery. To reduce swelling use pillows to elevate your arm so it is above your heart. Your doctor will tell you if you need to lightly wrap your arm with an ACE bandage.  Diet  Resume your normal diet. There are not special food restrictions following this procedure. In order to heal from your surgery, it is CRITICAL to get adequate nutrition. Your body requires vitamins, minerals, and protein. Vegetables are the best source of vitamins and minerals. Vegetables also provide the perfect balance of protein. Processed food has little nutritional value, so try to avoid this.  Medications  Resume taking all of your medications. If your incision is causing pain, you may take over-the counter pain relievers such as acetaminophen (Tylenol). If you were prescribed a stronger pain medication, please be aware these medications can cause nausea and constipation. Prevent  nausea by taking the medication with a snack or meal. Avoid constipation by drinking plenty of fluids and eating foods with high amount of fiber, such as fruits, vegetables, and grains.  Do not take Tylenol if you are taking prescription pain medications.  Follow up Your surgeon may want to see you in the office following your access surgery. If so, this will be arranged at the time of your surgery.  Please call us immediately for any of the following conditions:  Increased pain, redness, drainage (pus) from your incision site Fever of 101 degrees or higher Severe or worsening pain at your incision site Hand pain or numbness.  Reduce your risk of vascular disease:  Stop smoking. If you would like help, call [facility] at 1-800-QUIT-NOW (667)087-6792) or [place] [facility] [place] your cholesterol Maintain a desired weight Control your diabetes Keep your blood pressure down  Dialysis  It will take several weeks to several months for your new dialysis access to be ready for use. Your surgeon will determine when it is okay to use it. Your nephrologist will continue to direct your dialysis. You can continue to use your Permcath until your new access is ready for use.   05/17/2018 Karen Morrow [id number] 22-Dec-1960  Surgeon(s): Waynetta Sandy, MD  Procedure(s): CREATION OF BRACHIOCEPHALIC FISTULA LEFT ARM  x Do not stick fistula for 12 weeks    If you have any questions, please call the office at [phone].

## 2018-05-18 ENCOUNTER — Encounter (HOSPITAL_COMMUNITY): Payer: Self-pay | Admitting: Vascular Surgery

## 2018-05-18 DIAGNOSIS — N186 End stage renal disease: Secondary | ICD-10-CM | POA: Diagnosis not present

## 2018-05-18 DIAGNOSIS — Z992 Dependence on renal dialysis: Secondary | ICD-10-CM | POA: Diagnosis not present

## 2018-05-18 DIAGNOSIS — D689 Coagulation defect, unspecified: Secondary | ICD-10-CM | POA: Diagnosis not present

## 2018-05-18 DIAGNOSIS — D631 Anemia in chronic kidney disease: Secondary | ICD-10-CM | POA: Diagnosis not present

## 2018-05-18 DIAGNOSIS — N2581 Secondary hyperparathyroidism of renal origin: Secondary | ICD-10-CM | POA: Diagnosis not present

## 2018-05-19 ENCOUNTER — Other Ambulatory Visit: Payer: Self-pay

## 2018-05-19 DIAGNOSIS — N186 End stage renal disease: Secondary | ICD-10-CM

## 2018-05-19 DIAGNOSIS — Z992 Dependence on renal dialysis: Secondary | ICD-10-CM

## 2018-05-20 DIAGNOSIS — Z992 Dependence on renal dialysis: Secondary | ICD-10-CM | POA: Diagnosis not present

## 2018-05-20 DIAGNOSIS — D631 Anemia in chronic kidney disease: Secondary | ICD-10-CM | POA: Diagnosis not present

## 2018-05-20 DIAGNOSIS — N186 End stage renal disease: Secondary | ICD-10-CM | POA: Diagnosis not present

## 2018-05-20 DIAGNOSIS — D689 Coagulation defect, unspecified: Secondary | ICD-10-CM | POA: Diagnosis not present

## 2018-05-20 DIAGNOSIS — N2581 Secondary hyperparathyroidism of renal origin: Secondary | ICD-10-CM | POA: Diagnosis not present

## 2018-05-22 DIAGNOSIS — D631 Anemia in chronic kidney disease: Secondary | ICD-10-CM | POA: Diagnosis not present

## 2018-05-22 DIAGNOSIS — N2581 Secondary hyperparathyroidism of renal origin: Secondary | ICD-10-CM | POA: Diagnosis not present

## 2018-05-22 DIAGNOSIS — D689 Coagulation defect, unspecified: Secondary | ICD-10-CM | POA: Diagnosis not present

## 2018-05-22 DIAGNOSIS — N186 End stage renal disease: Secondary | ICD-10-CM | POA: Diagnosis not present

## 2018-05-22 DIAGNOSIS — Z992 Dependence on renal dialysis: Secondary | ICD-10-CM | POA: Diagnosis not present

## 2018-05-25 DIAGNOSIS — D631 Anemia in chronic kidney disease: Secondary | ICD-10-CM | POA: Diagnosis not present

## 2018-05-25 DIAGNOSIS — N186 End stage renal disease: Secondary | ICD-10-CM | POA: Diagnosis not present

## 2018-05-25 DIAGNOSIS — N2581 Secondary hyperparathyroidism of renal origin: Secondary | ICD-10-CM | POA: Diagnosis not present

## 2018-05-25 DIAGNOSIS — D689 Coagulation defect, unspecified: Secondary | ICD-10-CM | POA: Diagnosis not present

## 2018-05-25 DIAGNOSIS — Z992 Dependence on renal dialysis: Secondary | ICD-10-CM | POA: Diagnosis not present

## 2018-05-27 DIAGNOSIS — D631 Anemia in chronic kidney disease: Secondary | ICD-10-CM | POA: Diagnosis not present

## 2018-05-27 DIAGNOSIS — N186 End stage renal disease: Secondary | ICD-10-CM | POA: Diagnosis not present

## 2018-05-27 DIAGNOSIS — N2581 Secondary hyperparathyroidism of renal origin: Secondary | ICD-10-CM | POA: Diagnosis not present

## 2018-05-27 DIAGNOSIS — Z992 Dependence on renal dialysis: Secondary | ICD-10-CM | POA: Diagnosis not present

## 2018-05-27 DIAGNOSIS — D689 Coagulation defect, unspecified: Secondary | ICD-10-CM | POA: Diagnosis not present

## 2018-05-29 DIAGNOSIS — D631 Anemia in chronic kidney disease: Secondary | ICD-10-CM | POA: Diagnosis not present

## 2018-05-29 DIAGNOSIS — D689 Coagulation defect, unspecified: Secondary | ICD-10-CM | POA: Diagnosis not present

## 2018-05-29 DIAGNOSIS — N2581 Secondary hyperparathyroidism of renal origin: Secondary | ICD-10-CM | POA: Diagnosis not present

## 2018-05-29 DIAGNOSIS — N186 End stage renal disease: Secondary | ICD-10-CM | POA: Diagnosis not present

## 2018-05-29 DIAGNOSIS — Z992 Dependence on renal dialysis: Secondary | ICD-10-CM | POA: Diagnosis not present

## 2018-05-31 DIAGNOSIS — N186 End stage renal disease: Secondary | ICD-10-CM | POA: Diagnosis not present

## 2018-05-31 DIAGNOSIS — I120 Hypertensive chronic kidney disease with stage 5 chronic kidney disease or end stage renal disease: Secondary | ICD-10-CM | POA: Diagnosis not present

## 2018-05-31 DIAGNOSIS — Z992 Dependence on renal dialysis: Secondary | ICD-10-CM | POA: Diagnosis not present

## 2018-06-01 DIAGNOSIS — D631 Anemia in chronic kidney disease: Secondary | ICD-10-CM | POA: Diagnosis not present

## 2018-06-01 DIAGNOSIS — K7460 Unspecified cirrhosis of liver: Secondary | ICD-10-CM | POA: Diagnosis not present

## 2018-06-01 DIAGNOSIS — N186 End stage renal disease: Secondary | ICD-10-CM | POA: Diagnosis not present

## 2018-06-01 DIAGNOSIS — N2581 Secondary hyperparathyroidism of renal origin: Secondary | ICD-10-CM | POA: Diagnosis not present

## 2018-06-01 DIAGNOSIS — Z992 Dependence on renal dialysis: Secondary | ICD-10-CM | POA: Diagnosis not present

## 2018-06-03 DIAGNOSIS — D631 Anemia in chronic kidney disease: Secondary | ICD-10-CM | POA: Diagnosis not present

## 2018-06-03 DIAGNOSIS — N186 End stage renal disease: Secondary | ICD-10-CM | POA: Diagnosis not present

## 2018-06-03 DIAGNOSIS — N2581 Secondary hyperparathyroidism of renal origin: Secondary | ICD-10-CM | POA: Diagnosis not present

## 2018-06-03 DIAGNOSIS — K7460 Unspecified cirrhosis of liver: Secondary | ICD-10-CM | POA: Diagnosis not present

## 2018-06-03 DIAGNOSIS — Z992 Dependence on renal dialysis: Secondary | ICD-10-CM | POA: Diagnosis not present

## 2018-06-06 DIAGNOSIS — K7460 Unspecified cirrhosis of liver: Secondary | ICD-10-CM | POA: Diagnosis not present

## 2018-06-06 DIAGNOSIS — D631 Anemia in chronic kidney disease: Secondary | ICD-10-CM | POA: Diagnosis not present

## 2018-06-06 DIAGNOSIS — N2581 Secondary hyperparathyroidism of renal origin: Secondary | ICD-10-CM | POA: Diagnosis not present

## 2018-06-06 DIAGNOSIS — N186 End stage renal disease: Secondary | ICD-10-CM | POA: Diagnosis not present

## 2018-06-06 DIAGNOSIS — Z992 Dependence on renal dialysis: Secondary | ICD-10-CM | POA: Diagnosis not present

## 2018-06-07 MED FILL — PANTOPRAZOLE SOD DR 20 MG T: 20 | 30 days supply | Qty: 60 | Fill #3

## 2018-06-08 DIAGNOSIS — D631 Anemia in chronic kidney disease: Secondary | ICD-10-CM | POA: Diagnosis not present

## 2018-06-08 DIAGNOSIS — Z992 Dependence on renal dialysis: Secondary | ICD-10-CM | POA: Diagnosis not present

## 2018-06-08 DIAGNOSIS — N2581 Secondary hyperparathyroidism of renal origin: Secondary | ICD-10-CM | POA: Diagnosis not present

## 2018-06-08 DIAGNOSIS — K7460 Unspecified cirrhosis of liver: Secondary | ICD-10-CM | POA: Diagnosis not present

## 2018-06-08 DIAGNOSIS — N186 End stage renal disease: Secondary | ICD-10-CM | POA: Diagnosis not present

## 2018-06-10 DIAGNOSIS — D631 Anemia in chronic kidney disease: Secondary | ICD-10-CM | POA: Diagnosis not present

## 2018-06-10 DIAGNOSIS — Z992 Dependence on renal dialysis: Secondary | ICD-10-CM | POA: Diagnosis not present

## 2018-06-10 DIAGNOSIS — N186 End stage renal disease: Secondary | ICD-10-CM | POA: Diagnosis not present

## 2018-06-10 DIAGNOSIS — N2581 Secondary hyperparathyroidism of renal origin: Secondary | ICD-10-CM | POA: Diagnosis not present

## 2018-06-10 DIAGNOSIS — K7460 Unspecified cirrhosis of liver: Secondary | ICD-10-CM | POA: Diagnosis not present

## 2018-06-13 DIAGNOSIS — D631 Anemia in chronic kidney disease: Secondary | ICD-10-CM | POA: Diagnosis not present

## 2018-06-13 DIAGNOSIS — N186 End stage renal disease: Secondary | ICD-10-CM | POA: Diagnosis not present

## 2018-06-13 DIAGNOSIS — K7460 Unspecified cirrhosis of liver: Secondary | ICD-10-CM | POA: Diagnosis not present

## 2018-06-13 DIAGNOSIS — Z992 Dependence on renal dialysis: Secondary | ICD-10-CM | POA: Diagnosis not present

## 2018-06-13 DIAGNOSIS — N2581 Secondary hyperparathyroidism of renal origin: Secondary | ICD-10-CM | POA: Diagnosis not present

## 2018-06-15 DIAGNOSIS — D631 Anemia in chronic kidney disease: Secondary | ICD-10-CM | POA: Diagnosis not present

## 2018-06-15 DIAGNOSIS — N186 End stage renal disease: Secondary | ICD-10-CM | POA: Diagnosis not present

## 2018-06-15 DIAGNOSIS — Z992 Dependence on renal dialysis: Secondary | ICD-10-CM | POA: Diagnosis not present

## 2018-06-15 DIAGNOSIS — K7460 Unspecified cirrhosis of liver: Secondary | ICD-10-CM | POA: Diagnosis not present

## 2018-06-15 DIAGNOSIS — N2581 Secondary hyperparathyroidism of renal origin: Secondary | ICD-10-CM | POA: Diagnosis not present

## 2018-06-17 DIAGNOSIS — K7460 Unspecified cirrhosis of liver: Secondary | ICD-10-CM | POA: Diagnosis not present

## 2018-06-17 DIAGNOSIS — D631 Anemia in chronic kidney disease: Secondary | ICD-10-CM | POA: Diagnosis not present

## 2018-06-17 DIAGNOSIS — N2581 Secondary hyperparathyroidism of renal origin: Secondary | ICD-10-CM | POA: Diagnosis not present

## 2018-06-17 DIAGNOSIS — N186 End stage renal disease: Secondary | ICD-10-CM | POA: Diagnosis not present

## 2018-06-17 DIAGNOSIS — Z992 Dependence on renal dialysis: Secondary | ICD-10-CM | POA: Diagnosis not present

## 2018-06-20 DIAGNOSIS — K7460 Unspecified cirrhosis of liver: Secondary | ICD-10-CM | POA: Diagnosis not present

## 2018-06-20 DIAGNOSIS — Z992 Dependence on renal dialysis: Secondary | ICD-10-CM | POA: Diagnosis not present

## 2018-06-20 DIAGNOSIS — N2581 Secondary hyperparathyroidism of renal origin: Secondary | ICD-10-CM | POA: Diagnosis not present

## 2018-06-20 DIAGNOSIS — D631 Anemia in chronic kidney disease: Secondary | ICD-10-CM | POA: Diagnosis not present

## 2018-06-20 DIAGNOSIS — N186 End stage renal disease: Secondary | ICD-10-CM | POA: Diagnosis not present

## 2018-06-22 DIAGNOSIS — Z992 Dependence on renal dialysis: Secondary | ICD-10-CM | POA: Diagnosis not present

## 2018-06-22 DIAGNOSIS — N186 End stage renal disease: Secondary | ICD-10-CM | POA: Diagnosis not present

## 2018-06-22 DIAGNOSIS — D631 Anemia in chronic kidney disease: Secondary | ICD-10-CM | POA: Diagnosis not present

## 2018-06-22 DIAGNOSIS — K7460 Unspecified cirrhosis of liver: Secondary | ICD-10-CM | POA: Diagnosis not present

## 2018-06-22 DIAGNOSIS — N2581 Secondary hyperparathyroidism of renal origin: Secondary | ICD-10-CM | POA: Diagnosis not present

## 2018-06-24 DIAGNOSIS — Z992 Dependence on renal dialysis: Secondary | ICD-10-CM | POA: Diagnosis not present

## 2018-06-24 DIAGNOSIS — N2581 Secondary hyperparathyroidism of renal origin: Secondary | ICD-10-CM | POA: Diagnosis not present

## 2018-06-24 DIAGNOSIS — K7460 Unspecified cirrhosis of liver: Secondary | ICD-10-CM | POA: Diagnosis not present

## 2018-06-24 DIAGNOSIS — D631 Anemia in chronic kidney disease: Secondary | ICD-10-CM | POA: Diagnosis not present

## 2018-06-24 DIAGNOSIS — N186 End stage renal disease: Secondary | ICD-10-CM | POA: Diagnosis not present

## 2018-06-27 DIAGNOSIS — K7460 Unspecified cirrhosis of liver: Secondary | ICD-10-CM | POA: Diagnosis not present

## 2018-06-27 DIAGNOSIS — N2581 Secondary hyperparathyroidism of renal origin: Secondary | ICD-10-CM | POA: Diagnosis not present

## 2018-06-27 DIAGNOSIS — N186 End stage renal disease: Secondary | ICD-10-CM | POA: Diagnosis not present

## 2018-06-27 DIAGNOSIS — Z992 Dependence on renal dialysis: Secondary | ICD-10-CM | POA: Diagnosis not present

## 2018-06-27 DIAGNOSIS — D631 Anemia in chronic kidney disease: Secondary | ICD-10-CM | POA: Diagnosis not present

## 2018-06-29 DIAGNOSIS — D631 Anemia in chronic kidney disease: Secondary | ICD-10-CM | POA: Diagnosis not present

## 2018-06-29 DIAGNOSIS — K7460 Unspecified cirrhosis of liver: Secondary | ICD-10-CM | POA: Diagnosis not present

## 2018-06-29 DIAGNOSIS — N186 End stage renal disease: Secondary | ICD-10-CM | POA: Diagnosis not present

## 2018-06-29 DIAGNOSIS — Z992 Dependence on renal dialysis: Secondary | ICD-10-CM | POA: Diagnosis not present

## 2018-06-29 DIAGNOSIS — N2581 Secondary hyperparathyroidism of renal origin: Secondary | ICD-10-CM | POA: Diagnosis not present

## 2018-06-30 ENCOUNTER — Encounter: Payer: Self-pay | Admitting: Family

## 2018-06-30 ENCOUNTER — Ambulatory Visit (HOSPITAL_COMMUNITY)
Admission: RE | Admit: 2018-06-30 | Discharge: 2018-06-30 | Disposition: A | Discharge status: Home / Self Care | Payer: Medicare Other | Source: Ambulatory Visit | Attending: Family | Admitting: Family

## 2018-06-30 ENCOUNTER — Other Ambulatory Visit: Payer: Self-pay

## 2018-06-30 ENCOUNTER — Ambulatory Visit (INDEPENDENT_AMBULATORY_CARE_PROVIDER_SITE_OTHER): Payer: Self-pay | Admitting: Physician Assistant

## 2018-06-30 VITALS — BP 121/81 | HR 102 | Temp 98.7°F | Resp 14 | Ht 67.0 in | Wt 126.0 lb

## 2018-06-30 DIAGNOSIS — N186 End stage renal disease: Secondary | ICD-10-CM

## 2018-06-30 DIAGNOSIS — Z992 Dependence on renal dialysis: Secondary | ICD-10-CM | POA: Insufficient documentation

## 2018-06-30 MED FILL — AMLODIPINE BESYLATE 10 MG T: 10 | 30 days supply | Qty: 30 | Fill #1

## 2018-06-30 NOTE — Progress Notes (Signed)
    Postoperative Access Visit   History of Present Illness   Karen Morrow is a 58 y.o. year old female who presents for postoperative follow-up for: left brachiocephalic arteriovenous fistula by Dr. Donzetta Matters (Date: 05/17/18).  The patient's wounds are well healed.  The patient denies steal symptoms.  The patient is able to complete their activities of daily living.  Prior dialysis access includes R arm AV graft which was excised due to infection.  She is dialyzing from L IJ TDC on MWF schedule at [facility].   Physical Examination   Vitals:   06/30/18 1358  BP: 121/81  Pulse: (!) 102  Resp: 14  Temp: 98.7 F (37.1 C)  TempSrc: Oral  SpO2: 100%  Weight: 126 lb (57.2 kg)  Height: 5\' 7"  (1.702 m)   Body mass index is 19.73 kg/m.  left arm Incision is healed, hand grip is 5/5, sensation in digits is intact, palpable thrill, bruit can be auscultated, palpable thrill in mid upper arm, area of narrowing palpable in antecubitum; palpable L radial pulse     Medical Decision Making   Karen Morrow is a 58 y.o. year old female who presents s/p left brachiocephalic arteriovenous fistula   L brachiocephalic fistula is patent; no signs or symptoms of steal  Fistula will continue to mature over the next 4-6 weeks  At that time we will recheck fistula duplex prior to allow fistula to be used on HD  Continue HD via [facility] for now   Dagoberto Ligas PA-C [facility] [place] Office: [phone]

## 2018-07-01 DIAGNOSIS — D689 Coagulation defect, unspecified: Secondary | ICD-10-CM | POA: Diagnosis not present

## 2018-07-01 DIAGNOSIS — N186 End stage renal disease: Secondary | ICD-10-CM | POA: Diagnosis not present

## 2018-07-01 DIAGNOSIS — I120 Hypertensive chronic kidney disease with stage 5 chronic kidney disease or end stage renal disease: Secondary | ICD-10-CM | POA: Diagnosis not present

## 2018-07-01 DIAGNOSIS — D631 Anemia in chronic kidney disease: Secondary | ICD-10-CM | POA: Diagnosis not present

## 2018-07-01 DIAGNOSIS — N2581 Secondary hyperparathyroidism of renal origin: Secondary | ICD-10-CM | POA: Diagnosis not present

## 2018-07-01 DIAGNOSIS — Z992 Dependence on renal dialysis: Secondary | ICD-10-CM | POA: Diagnosis not present

## 2018-07-04 DIAGNOSIS — D631 Anemia in chronic kidney disease: Secondary | ICD-10-CM | POA: Diagnosis not present

## 2018-07-04 DIAGNOSIS — N2581 Secondary hyperparathyroidism of renal origin: Secondary | ICD-10-CM | POA: Diagnosis not present

## 2018-07-04 DIAGNOSIS — D689 Coagulation defect, unspecified: Secondary | ICD-10-CM | POA: Diagnosis not present

## 2018-07-04 DIAGNOSIS — N186 End stage renal disease: Secondary | ICD-10-CM | POA: Diagnosis not present

## 2018-07-04 DIAGNOSIS — Z992 Dependence on renal dialysis: Secondary | ICD-10-CM | POA: Diagnosis not present

## 2018-07-05 ENCOUNTER — Other Ambulatory Visit: Payer: Self-pay

## 2018-07-05 DIAGNOSIS — N186 End stage renal disease: Secondary | ICD-10-CM

## 2018-07-05 DIAGNOSIS — Z992 Dependence on renal dialysis: Secondary | ICD-10-CM

## 2018-07-06 DIAGNOSIS — Z992 Dependence on renal dialysis: Secondary | ICD-10-CM | POA: Diagnosis not present

## 2018-07-06 DIAGNOSIS — N186 End stage renal disease: Secondary | ICD-10-CM | POA: Diagnosis not present

## 2018-07-06 DIAGNOSIS — D631 Anemia in chronic kidney disease: Secondary | ICD-10-CM | POA: Diagnosis not present

## 2018-07-06 DIAGNOSIS — D689 Coagulation defect, unspecified: Secondary | ICD-10-CM | POA: Diagnosis not present

## 2018-07-06 DIAGNOSIS — N2581 Secondary hyperparathyroidism of renal origin: Secondary | ICD-10-CM | POA: Diagnosis not present

## 2018-07-07 ENCOUNTER — Ambulatory Visit (INDEPENDENT_AMBULATORY_CARE_PROVIDER_SITE_OTHER): Payer: Medicare Other | Admitting: Cardiovascular Disease

## 2018-07-07 ENCOUNTER — Encounter: Payer: Self-pay | Admitting: Cardiovascular Disease

## 2018-07-07 DIAGNOSIS — I5032 Chronic diastolic (congestive) heart failure: Secondary | ICD-10-CM

## 2018-07-07 DIAGNOSIS — I10 Essential (primary) hypertension: Secondary | ICD-10-CM | POA: Diagnosis not present

## 2018-07-07 DIAGNOSIS — I351 Nonrheumatic aortic (valve) insufficiency: Secondary | ICD-10-CM

## 2018-07-07 MED ORDER — NICOTINE 7 MG/24HR TD PT24: 7.0000 mg | MEDICATED_PATCH | Freq: Every day | TRANSDERMAL | 0 refills | Status: DC

## 2018-07-07 MED ORDER — NICOTINE 21 MG/24HR TD PT24: 21.0000 mg | MEDICATED_PATCH | Freq: Every day | TRANSDERMAL | 0 refills | Status: DC

## 2018-07-07 MED ORDER — NICOTINE 14 MG/24HR TD PT24: 14.0000 mg | MEDICATED_PATCH | Freq: Every day | TRANSDERMAL | 0 refills | Status: DC

## 2018-07-07 NOTE — Assessment & Plan Note (Signed)
Stenosis recently hospitalized last month for diastolic heart failure and hypertension.  2D echo performed at that time revealed normal LV systolic function, moderate to severe LVH with grade 1 diastolic dysfunction.

## 2018-07-07 NOTE — Assessment & Plan Note (Signed)
History of essential hypertension her blood pressure measured today at 118/70.  She was recently admitted to [facility] for 3 days last month for diastolic heart failure and hypertension.  Her fluid balance was adjusted by dialysis and her medications were adjusted as well.  Blood pressures apparently in hemodialysis recently have been well controlled.  She is on amlodipine, Catapres 0.1 3 times daily, and labetalol.

## 2018-07-07 NOTE — Addendum Note (Signed)
Addended by: Crissie Reese on: 07/07/2018 09:51 AM   Modules accepted: Orders

## 2018-07-07 NOTE — Addendum Note (Signed)
Addended by: Annita Brod on: 07/07/2018 09:20 AM   Modules accepted: Orders

## 2018-07-07 NOTE — Assessment & Plan Note (Signed)
Long history of aortic insufficiency with recent echo performed 10/12/2017 revealing mild to moderate moderate AI.  We will repeat a 2D echo in 12 months.

## 2018-07-07 NOTE — Patient Instructions (Signed)
Medication Instructions:  START NICOTINE (NICODERM) PATCH, ONE PATCH DAILY USING THE FOLLOWING DIRECTIONS:  APPLY ONE 21 MG PATCH DAILY FOR 6 WEEKS THEN, APPLY ONE 14 MG PATCH DAILY FOR 2-4 WEEKS THEN, APPLY ONE 7 MG PATCH DAILY FOR 2 WEEKS  If you need a refill on your cardiac medications before your next appointment, please call your pharmacy.   Lab work: NONE If you have labs (blood work) drawn today and your tests are completely normal, you will receive your results only by: Marland Kitchen MyChart Message (if you have MyChart) OR . A paper copy in the mail If you have any lab test that is abnormal or we need to change your treatment, we will call you to review the results.  Testing/Procedures: Your physician has requested that you have an echocardiogram. Echocardiography is a painless test that uses sound waves to create images of your heart. It provides your doctor with information about the size and shape of your heart and how well your heart's chambers and valves are working. This procedure takes approximately one hour. There are no restrictions for this procedure.    Follow-Up: At [facility], you and your health needs are our priority.  As part of our continuing mission to provide you with exceptional heart care, we have created designated Provider Care Teams.  These Care Teams include your primary Cardiologist (physician) and Advanced Practice Providers (APPs -  Physician Assistants and Nurse Practitioners) who all work together to provide you with the care you need, when you need it. . You will need a follow up appointment in 6 months with an APP and 12 months with Dr. Gwenlyn Found.  Please call our office 2 months in advance to schedule this appointment.  You may see Dr. Gwenlyn Found or one of the following Advanced Practice Providers on your designated Care Team:   . Kerin Ransom, [place] . Almyra Deforest, PA-C . Fabian Sharp, PA-C . Jory Sims, DNP . Rosaria Ferries, PA-C . Roby Lofts,  PA-C . Sande Rives, PA-C

## 2018-07-07 NOTE — Progress Notes (Signed)
07/07/2018 Karen Morrow   05-Dec-1960  [id number]  Primary Physician [name], MD Primary Cardiologist: Lorretta Harp MD Lupe Carney, [place]  HPI:  Karen Morrow is a 58 y.o.  thin-appearing married African-American female mother of 2, grandmother 2 grandchildren who is referred by Dr. Braulio Morrow for cardiovascular evaluation because of new onset chest pain.   I last saw her in the office 06/29/2017.  Her risk factor profile is notable for continued tobacco abuse of one half pack per day for last 25 years as well as treated hypertension. She does have a family history with father who died of a myocardial infarction in his 58s. She has never had a heart attack or stroke. She has been on hemodialysis for last 4 years. Last several weeks she's noticed substernal chest pain at the termination of dialysis lasting several minutes at a time. She did have a 2-D echocardiogram performed 11/08/16 revealing normal LV systolic function, grade 2 diastolic dysfunction with a bicuspid aortic valve, moderate AI and severe pulmonary hypertension.  She was admitted last year with sepsis and graft failure.  She currently has a dialysis catheter tunneled and in place with a new AV fistula placed by Dr. Donzetta Morrow which is maturing.  She had prolonged hospitalization and antibiotic therapy.  She also was admitted on 70/05/7492 with diastolic heart failure, respiratory insufficiency and hypertension.  She was dialyzed and her medications were adjusted.  She does continue to smoke a half a pack of cigarettes a day.  She denies chest pain or shortness of breath.  Blood pressures have been under better control during dialysis.  Current Meds  Medication Sig  . albuterol (PROVENTIL HFA;VENTOLIN HFA) 108 (90 Base) MCG/ACT inhaler TAKE 2 PUFFS BY MOUTH EVERY 6 HOURS AS NEEDED FOR WHEEZE OR SHORTNESS OF BREATH (Patient taking differently: Inhale 2 puffs into the lungs every 6 (six) hours as needed for wheezing. )    . albuterol (PROVENTIL) (2.5 MG/3ML) 0.083% nebulizer solution Take 3 mLs (2.5 mg total) by nebulization every 6 (six) hours as needed for wheezing or shortness of breath.  Marland Kitchen amLODipine (NORVASC) 10 MG tablet Take 1 tablet (10 mg total) by mouth daily.  Lorin Picket 1 GM 210 MG(Fe) tablet TAKE 1 TABLET BY MOUTH THREE TIMES A DAY WITH MEALS AND TWICE A DAY WITH SNACKS.   SWALLOW WHOLE, DO NOT CHEW OR CRUSH MEDICATION  . calcium acetate (PHOSLO) 667 MG capsule Take 1,334 mg by mouth 3 (three) times daily with meals.   . cinacalcet (SENSIPAR) 30 MG tablet TAKE 1 TABLET BY MOUTH DAILY EVERY EVENING (DO NOT TAKE LESS THAN 12 HOURS PRIOR TO DIALYSIS)  . cloNIDine (CATAPRES) 0.1 MG tablet Take 1 tablet (0.1 mg total) by mouth 3 (three) times daily.  Marland Kitchen labetalol (NORMODYNE) 200 MG tablet Take 1 tablet (200 mg total) by mouth 2 (two) times daily.  Marland Kitchen lidocaine-prilocaine (EMLA) cream APPLY SMALL AMOUNT TO ACCESS SITE (AVF) 1 TO 2 HOURS BEFORE DIALYSIS. COVER WITH OCCLUSIVE DRESSING (SARAN WRAP)  . oxyCODONE-acetaminophen (PERCOCET) 5-325 MG tablet Take 1 tablet by mouth every 6 (six) hours as needed for severe pain.  . pantoprazole (PROTONIX) 20 MG tablet Take 1 tablet (20 mg total) by mouth 2 (two) times daily before a meal.     Allergies  Allergen Reactions  . Cefazolin Other (See Comments)    Severe thrombocytopenia (has tolerated zosyn in the past) ID aware    Social History   Socioeconomic  History  . Marital status: Married    Spouse name: Not on file  . Number of children: Not on file  . Years of education: Not on file  . Highest education level: Not on file  Occupational History  . Not on file  Social Needs  . Financial resource strain: Not on file  . Food insecurity:    Worry: Not on file    Inability: Not on file  . Transportation needs:    Medical: Not on file    Non-medical: Not on file  Tobacco Use  . Smoking status: Light Tobacco Smoker    Packs/day: 0.50    Years: 20.00     Pack years: 10.00    Types: Cigarettes  . Smokeless tobacco: Never Used  Substance and Sexual Activity  . Alcohol use: Not Currently    Alcohol/week: 1.0 standard drinks    Types: 1 Glasses of wine per week  . Drug use: Yes    Types: Marijuana  . Sexual activity: Not Currently  Lifestyle  . Physical activity:    Days per week: Not on file    Minutes per session: Not on file  . Stress: Not on file  Relationships  . Social connections:    Talks on phone: Not on file    Gets together: Not on file    Attends religious service: Not on file    Active member of club or organization: Not on file    Attends meetings of clubs or organizations: Not on file    Relationship status: Not on file  . Intimate partner violence:    Fear of current or ex partner: Not on file    Emotionally abused: Not on file    Physically abused: Not on file    Forced sexual activity: Not on file  Other Topics Concern  . Not on file  Social History Narrative  . Not on file     Review of Systems: General: negative for chills, fever, night sweats or weight changes.  Cardiovascular: negative for chest pain, dyspnea on exertion, edema, orthopnea, palpitations, paroxysmal nocturnal dyspnea or shortness of breath Dermatological: negative for rash Respiratory: negative for cough or wheezing Urologic: negative for hematuria Abdominal: negative for nausea, vomiting, diarrhea, bright red blood per rectum, melena, or hematemesis Neurologic: negative for visual changes, syncope, or dizziness All other systems reviewed and are otherwise negative except as noted above.    Blood pressure 118/70, pulse 90, height 5\' 7"  (1.702 m), weight 125 lb 9.6 oz (57 kg).  General appearance: alert and no distress Neck: no adenopathy, no carotid bruit, no JVD, supple, symmetrical, trachea midline and thyroid not enlarged, symmetric, no tenderness/mass/nodules Lungs: clear to auscultation bilaterally Heart: regular rate and rhythm,  S1, S2 normal, no murmur, click, rub or gallop Extremities: extremities normal, atraumatic, no cyanosis or edema Pulses: 2+ and symmetric Skin: Skin color, texture, turgor normal. No rashes or lesions Neurologic: Alert and oriented X 3, normal strength and tone. Normal symmetric reflexes. Normal coordination and gait  EKG not performed today  ASSESSMENT AND PLAN:   HTN (hypertension) History of essential hypertension her blood pressure measured today at 118/70.  She was recently admitted to [facility] for 3 days last month for diastolic heart failure and hypertension.  Her fluid balance was adjusted by dialysis and her medications were adjusted as well.  Blood pressures apparently in hemodialysis recently have been well controlled.  She is on amlodipine, Catapres 0.1 3 times daily, and labetalol.  Chronic diastolic (congestive) heart failure (HCC) Stenosis recently hospitalized last month for diastolic heart failure and hypertension.  2D echo performed at that time revealed normal LV systolic function, moderate to severe LVH with grade 1 diastolic dysfunction.  Aortic insufficiency Long history of aortic insufficiency with recent echo performed 10/12/2017 revealing mild to moderate moderate AI.  We will repeat a 2D echo in 12 months.      Lorretta Harp MD FACP,FACC,FAHA, [facility] 07/07/2018 9:01 AM

## 2018-07-08 DIAGNOSIS — N2581 Secondary hyperparathyroidism of renal origin: Secondary | ICD-10-CM | POA: Diagnosis not present

## 2018-07-08 DIAGNOSIS — D689 Coagulation defect, unspecified: Secondary | ICD-10-CM | POA: Diagnosis not present

## 2018-07-08 DIAGNOSIS — Z992 Dependence on renal dialysis: Secondary | ICD-10-CM | POA: Diagnosis not present

## 2018-07-08 DIAGNOSIS — N186 End stage renal disease: Secondary | ICD-10-CM | POA: Diagnosis not present

## 2018-07-08 DIAGNOSIS — D631 Anemia in chronic kidney disease: Secondary | ICD-10-CM | POA: Diagnosis not present

## 2018-07-11 DIAGNOSIS — D689 Coagulation defect, unspecified: Secondary | ICD-10-CM | POA: Diagnosis not present

## 2018-07-11 DIAGNOSIS — Z992 Dependence on renal dialysis: Secondary | ICD-10-CM | POA: Diagnosis not present

## 2018-07-11 DIAGNOSIS — D631 Anemia in chronic kidney disease: Secondary | ICD-10-CM | POA: Diagnosis not present

## 2018-07-11 DIAGNOSIS — N186 End stage renal disease: Secondary | ICD-10-CM | POA: Diagnosis not present

## 2018-07-11 DIAGNOSIS — N2581 Secondary hyperparathyroidism of renal origin: Secondary | ICD-10-CM | POA: Diagnosis not present

## 2018-07-12 ENCOUNTER — Other Ambulatory Visit (HOSPITAL_COMMUNITY): Payer: Medicare Other

## 2018-07-13 DIAGNOSIS — Z992 Dependence on renal dialysis: Secondary | ICD-10-CM | POA: Diagnosis not present

## 2018-07-13 DIAGNOSIS — N2581 Secondary hyperparathyroidism of renal origin: Secondary | ICD-10-CM | POA: Diagnosis not present

## 2018-07-13 DIAGNOSIS — D689 Coagulation defect, unspecified: Secondary | ICD-10-CM | POA: Diagnosis not present

## 2018-07-13 DIAGNOSIS — N186 End stage renal disease: Secondary | ICD-10-CM | POA: Diagnosis not present

## 2018-07-13 DIAGNOSIS — D631 Anemia in chronic kidney disease: Secondary | ICD-10-CM | POA: Diagnosis not present

## 2018-07-15 DIAGNOSIS — D631 Anemia in chronic kidney disease: Secondary | ICD-10-CM | POA: Diagnosis not present

## 2018-07-15 DIAGNOSIS — N186 End stage renal disease: Secondary | ICD-10-CM | POA: Diagnosis not present

## 2018-07-15 DIAGNOSIS — D689 Coagulation defect, unspecified: Secondary | ICD-10-CM | POA: Diagnosis not present

## 2018-07-15 DIAGNOSIS — N2581 Secondary hyperparathyroidism of renal origin: Secondary | ICD-10-CM | POA: Diagnosis not present

## 2018-07-15 DIAGNOSIS — Z992 Dependence on renal dialysis: Secondary | ICD-10-CM | POA: Diagnosis not present

## 2018-07-18 DIAGNOSIS — Z992 Dependence on renal dialysis: Secondary | ICD-10-CM | POA: Diagnosis not present

## 2018-07-18 DIAGNOSIS — N2581 Secondary hyperparathyroidism of renal origin: Secondary | ICD-10-CM | POA: Diagnosis not present

## 2018-07-18 DIAGNOSIS — D631 Anemia in chronic kidney disease: Secondary | ICD-10-CM | POA: Diagnosis not present

## 2018-07-18 DIAGNOSIS — D689 Coagulation defect, unspecified: Secondary | ICD-10-CM | POA: Diagnosis not present

## 2018-07-18 DIAGNOSIS — N186 End stage renal disease: Secondary | ICD-10-CM | POA: Diagnosis not present

## 2018-07-20 DIAGNOSIS — N2581 Secondary hyperparathyroidism of renal origin: Secondary | ICD-10-CM | POA: Diagnosis not present

## 2018-07-20 DIAGNOSIS — Z992 Dependence on renal dialysis: Secondary | ICD-10-CM | POA: Diagnosis not present

## 2018-07-20 DIAGNOSIS — N186 End stage renal disease: Secondary | ICD-10-CM | POA: Diagnosis not present

## 2018-07-20 DIAGNOSIS — D689 Coagulation defect, unspecified: Secondary | ICD-10-CM | POA: Diagnosis not present

## 2018-07-20 DIAGNOSIS — D631 Anemia in chronic kidney disease: Secondary | ICD-10-CM | POA: Diagnosis not present

## 2018-07-22 DIAGNOSIS — Z992 Dependence on renal dialysis: Secondary | ICD-10-CM | POA: Diagnosis not present

## 2018-07-22 DIAGNOSIS — D689 Coagulation defect, unspecified: Secondary | ICD-10-CM | POA: Diagnosis not present

## 2018-07-22 DIAGNOSIS — D631 Anemia in chronic kidney disease: Secondary | ICD-10-CM | POA: Diagnosis not present

## 2018-07-22 DIAGNOSIS — N186 End stage renal disease: Secondary | ICD-10-CM | POA: Diagnosis not present

## 2018-07-22 DIAGNOSIS — N2581 Secondary hyperparathyroidism of renal origin: Secondary | ICD-10-CM | POA: Diagnosis not present

## 2018-07-25 DIAGNOSIS — D689 Coagulation defect, unspecified: Secondary | ICD-10-CM | POA: Diagnosis not present

## 2018-07-25 DIAGNOSIS — D631 Anemia in chronic kidney disease: Secondary | ICD-10-CM | POA: Diagnosis not present

## 2018-07-25 DIAGNOSIS — N2581 Secondary hyperparathyroidism of renal origin: Secondary | ICD-10-CM | POA: Diagnosis not present

## 2018-07-25 DIAGNOSIS — Z992 Dependence on renal dialysis: Secondary | ICD-10-CM | POA: Diagnosis not present

## 2018-07-25 DIAGNOSIS — N186 End stage renal disease: Secondary | ICD-10-CM | POA: Diagnosis not present

## 2018-07-27 DIAGNOSIS — D631 Anemia in chronic kidney disease: Secondary | ICD-10-CM | POA: Diagnosis not present

## 2018-07-27 DIAGNOSIS — N186 End stage renal disease: Secondary | ICD-10-CM | POA: Diagnosis not present

## 2018-07-27 DIAGNOSIS — Z992 Dependence on renal dialysis: Secondary | ICD-10-CM | POA: Diagnosis not present

## 2018-07-27 DIAGNOSIS — N2581 Secondary hyperparathyroidism of renal origin: Secondary | ICD-10-CM | POA: Diagnosis not present

## 2018-07-27 DIAGNOSIS — D689 Coagulation defect, unspecified: Secondary | ICD-10-CM | POA: Diagnosis not present

## 2018-07-29 DIAGNOSIS — N2581 Secondary hyperparathyroidism of renal origin: Secondary | ICD-10-CM | POA: Diagnosis not present

## 2018-07-29 DIAGNOSIS — D689 Coagulation defect, unspecified: Secondary | ICD-10-CM | POA: Diagnosis not present

## 2018-07-29 DIAGNOSIS — D631 Anemia in chronic kidney disease: Secondary | ICD-10-CM | POA: Diagnosis not present

## 2018-07-29 DIAGNOSIS — Z992 Dependence on renal dialysis: Secondary | ICD-10-CM | POA: Diagnosis not present

## 2018-07-29 DIAGNOSIS — N186 End stage renal disease: Secondary | ICD-10-CM | POA: Diagnosis not present

## 2018-07-30 DIAGNOSIS — Z992 Dependence on renal dialysis: Secondary | ICD-10-CM | POA: Diagnosis not present

## 2018-07-30 DIAGNOSIS — I120 Hypertensive chronic kidney disease with stage 5 chronic kidney disease or end stage renal disease: Secondary | ICD-10-CM | POA: Diagnosis not present

## 2018-07-30 DIAGNOSIS — N186 End stage renal disease: Secondary | ICD-10-CM | POA: Diagnosis not present

## 2018-08-03 DIAGNOSIS — N2581 Secondary hyperparathyroidism of renal origin: Secondary | ICD-10-CM | POA: Diagnosis not present

## 2018-08-03 DIAGNOSIS — N186 End stage renal disease: Secondary | ICD-10-CM | POA: Diagnosis not present

## 2018-08-03 DIAGNOSIS — D631 Anemia in chronic kidney disease: Secondary | ICD-10-CM | POA: Diagnosis not present

## 2018-08-03 DIAGNOSIS — Z992 Dependence on renal dialysis: Secondary | ICD-10-CM | POA: Diagnosis not present

## 2018-08-03 DIAGNOSIS — D509 Iron deficiency anemia, unspecified: Secondary | ICD-10-CM | POA: Diagnosis not present

## 2018-08-05 DIAGNOSIS — Z992 Dependence on renal dialysis: Secondary | ICD-10-CM | POA: Diagnosis not present

## 2018-08-05 DIAGNOSIS — N2581 Secondary hyperparathyroidism of renal origin: Secondary | ICD-10-CM | POA: Diagnosis not present

## 2018-08-05 DIAGNOSIS — N186 End stage renal disease: Secondary | ICD-10-CM | POA: Diagnosis not present

## 2018-08-05 DIAGNOSIS — D509 Iron deficiency anemia, unspecified: Secondary | ICD-10-CM | POA: Diagnosis not present

## 2018-08-05 DIAGNOSIS — D631 Anemia in chronic kidney disease: Secondary | ICD-10-CM | POA: Diagnosis not present

## 2018-08-08 DIAGNOSIS — N186 End stage renal disease: Secondary | ICD-10-CM | POA: Diagnosis not present

## 2018-08-08 DIAGNOSIS — D509 Iron deficiency anemia, unspecified: Secondary | ICD-10-CM | POA: Diagnosis not present

## 2018-08-08 DIAGNOSIS — D631 Anemia in chronic kidney disease: Secondary | ICD-10-CM | POA: Diagnosis not present

## 2018-08-08 DIAGNOSIS — N2581 Secondary hyperparathyroidism of renal origin: Secondary | ICD-10-CM | POA: Diagnosis not present

## 2018-08-08 DIAGNOSIS — Z992 Dependence on renal dialysis: Secondary | ICD-10-CM | POA: Diagnosis not present

## 2018-08-10 DIAGNOSIS — N2581 Secondary hyperparathyroidism of renal origin: Secondary | ICD-10-CM | POA: Diagnosis not present

## 2018-08-10 DIAGNOSIS — N186 End stage renal disease: Secondary | ICD-10-CM | POA: Diagnosis not present

## 2018-08-10 DIAGNOSIS — Z992 Dependence on renal dialysis: Secondary | ICD-10-CM | POA: Diagnosis not present

## 2018-08-10 DIAGNOSIS — D631 Anemia in chronic kidney disease: Secondary | ICD-10-CM | POA: Diagnosis not present

## 2018-08-10 DIAGNOSIS — D509 Iron deficiency anemia, unspecified: Secondary | ICD-10-CM | POA: Diagnosis not present

## 2018-08-11 ENCOUNTER — Encounter: Payer: Self-pay | Admitting: Family

## 2018-08-11 ENCOUNTER — Encounter: Payer: Medicare Other | Admitting: Family

## 2018-08-11 ENCOUNTER — Other Ambulatory Visit: Payer: Self-pay

## 2018-08-11 ENCOUNTER — Ambulatory Visit (HOSPITAL_COMMUNITY)
Admission: RE | Admit: 2018-08-11 | Discharge: 2018-08-11 | Disposition: A | Discharge status: Home / Self Care | Payer: Medicare Other | Source: Ambulatory Visit | Attending: Vascular Surgery | Admitting: Vascular Surgery

## 2018-08-11 VITALS — BP 146/95 | HR 88 | Temp 97.5°F | Resp 14 | Ht 67.0 in | Wt 125.0 lb

## 2018-08-11 DIAGNOSIS — N186 End stage renal disease: Secondary | ICD-10-CM | POA: Diagnosis not present

## 2018-08-11 DIAGNOSIS — Z992 Dependence on renal dialysis: Secondary | ICD-10-CM

## 2018-08-11 DIAGNOSIS — I770 Arteriovenous fistula, acquired: Secondary | ICD-10-CM

## 2018-08-11 NOTE — Progress Notes (Signed)
CC: Follow up AVF creation  History of Present Illness  Karen Morrow is a 58 y.o. (February 29, 1961) female who is s/p left brachiocephalic arteriovenous fistula by Dr. Donzetta Matters (Date: 05/17/18).    She returns today for evaluation of her left brachiocephalic arteriovenous fistula.   Pt left before I could evaluate her.  She was last evaluated on 06-30-18 by M. Eveland PA-C. At that time the patient's wounds were well healed.  The patient denied steal symptoms.  The patient was able to complete their activities of daily living.  Prior dialysis access includes R arm AV graft which was excised due to infection.  She was dialyzing from [facility] on MWF schedule at [facility]. L brachiocephalic fistula was patent; no signs or symptoms of steal. Fistula will continue to mature over the next 4-6 weeks At that time we will recheck fistula duplex prior to allow fistula to be used on HD The plan at that time was to continue HD via [facility] for the time being.   Past Medical History:  Diagnosis Date  . Anginal pain ([place])   . Asthma   . CHF (congestive heart failure) ([place])   . Chronic kidney disease    dialysis 3x wk  . Dyspnea   . Frequent bowel movements   . GERD (gastroesophageal reflux disease)   . Hepatitis C antibody test positive   . Hypertension    "just dx'd today" (11/03/2012)    Social History Social History   Tobacco Use  . Smoking status: Light Tobacco Smoker    Packs/day: 0.50    Years: 20.00    Pack years: 10.00    Types: Cigarettes  . Smokeless tobacco: Never Used  Substance Use Topics  . Alcohol use: Not Currently    Alcohol/week: 1.0 standard drinks    Types: 1 Glasses of wine per week  . Drug use: Yes    Types: Marijuana    Family History Family History  Problem Relation Age of Onset  . Lupus Mother   . Diabetes Father   . Heart attack Father     Surgical History Past Surgical History:  Procedure Laterality Date  . ABDOMINAL HYSTERECTOMY      "partial" (11/03/2012)  . AV FISTULA PLACEMENT Right 12/03/2013   Procedure: CREATION OF ARTERIOVENOUS (AV) FISTULA RIGHT ARM ;  Surgeon: Rosetta Posner, MD;  Location: [place];  Service: Vascular;  Laterality: Right;  . AV FISTULA PLACEMENT Left 05/17/2018   Procedure: CREATION OF BRACHIOCEPHALIC FISTULA LEFT ARM;  Surgeon: Waynetta Sandy, MD;  Location: [place];  Service: Vascular;  Laterality: Left;  . Grand REMOVAL Right 03/16/2018   Procedure: REMOVAL OF ARTERIOVENOUS GORETEX GRAFT ([place]) RIGHT ARM;  Surgeon: Waynetta Sandy, MD;  Location: [place];  Service: Vascular;  Laterality: Right;  . BASCILIC VEIN TRANSPOSITION Right 03/06/2014   Procedure: SECOND STAGE BASILIC VEIN TRANSPOSITION;  Surgeon: Rosetta Posner, MD;  Location: [facility] OR;  Service: Vascular;  Laterality: Right;  . ESOPHAGOGASTRODUODENOSCOPY N/A 06/05/2013   Procedure: ESOPHAGOGASTRODUODENOSCOPY (EGD);  Surgeon: Winfield Cunas., MD;  Location: [facility];  Service: Endoscopy;  Laterality: N/A;  . ESOPHAGOGASTRODUODENOSCOPY N/A 09/08/2016   Procedure: ESOPHAGOGASTRODUODENOSCOPY (EGD);  Surgeon: Milus Banister, MD;  Location: [place];  Service: Endoscopy;  Laterality: N/A;  . INSERTION OF DIALYSIS CATHETER Left 03/16/2018   Procedure: PLACEMENT OF TUNNEL DIALYSIS CATHETER;  Surgeon: Waynetta Sandy, MD;  Location: [place];  Service: Vascular;  Laterality: Left;  .  REVISON OF ARTERIOVENOUS FISTULA Right 01/28/2017   Procedure: REVISON OF RIGHT ARM ARTERIOVENOUS FISTULA USING 8MMX10CM GORETEX GRAFT;  Surgeon: Angelia Mould, MD;  Location: [place];  Service: Vascular;  Laterality: Right;    Allergies  Allergen Reactions  . Cefazolin Other (See Comments)    Severe thrombocytopenia (has tolerated zosyn in the past) ID aware    Current Outpatient Medications  Medication Sig Dispense Refill  . albuterol (PROVENTIL HFA;VENTOLIN HFA) 108 (90 Base) MCG/ACT inhaler TAKE 2 PUFFS BY MOUTH EVERY 6 HOURS AS  NEEDED FOR WHEEZE OR SHORTNESS OF BREATH (Patient taking differently: Inhale 2 puffs into the lungs every 6 (six) hours as needed for wheezing. ) 18 Inhaler 0  . albuterol (PROVENTIL) (2.5 MG/3ML) 0.083% nebulizer solution Take 3 mLs (2.5 mg total) by nebulization every 6 (six) hours as needed for wheezing or shortness of breath. 150 mL 3  . amLODipine (NORVASC) 10 MG tablet Take 1 tablet (10 mg total) by mouth daily. 30 tablet 0  . AURYXIA 1 GM 210 MG(Fe) tablet TAKE 1 TABLET BY MOUTH THREE TIMES A DAY WITH MEALS AND TWICE A DAY WITH SNACKS.   SWALLOW WHOLE, DO NOT CHEW OR CRUSH MEDICATION    . calcium acetate (PHOSLO) 667 MG capsule Take 1,334 mg by mouth 3 (three) times daily with meals.     . cinacalcet (SENSIPAR) 30 MG tablet TAKE 1 TABLET BY MOUTH DAILY EVERY EVENING (DO NOT TAKE LESS THAN 12 HOURS PRIOR TO DIALYSIS)    . cloNIDine (CATAPRES) 0.1 MG tablet Take 1 tablet (0.1 mg total) by mouth 3 (three) times daily. 90 tablet 0  . labetalol (NORMODYNE) 200 MG tablet Take 1 tablet (200 mg total) by mouth 2 (two) times daily. 60 tablet 0  . lidocaine-prilocaine (EMLA) cream APPLY SMALL AMOUNT TO ACCESS SITE (AVF) 1 TO 2 HOURS BEFORE DIALYSIS. COVER WITH OCCLUSIVE DRESSING (SARAN WRAP)    . nicotine (NICODERM CQ) 14 mg/24hr patch Place 1 patch (14 mg total) onto the skin daily. START NICOTINE (NOICODERM) PATCH, ONE PATCH DAILY USING THE FOLLOWING DIRECTIONS:  APPLY ONE 21 MG PATCH DAILY FOR 6 WEEKS THEN, APPLY ONE 14 MG PATCH DAILY FOR 2-4 WEEKS THEN, APPLY ONE 7 MG PATCH DAILY FOR 2 WEEKS 28 patch 0  . nicotine (NICODERM CQ) 21 mg/24hr patch Place 1 patch (21 mg total) onto the skin daily. START NICOTINE (NOICODERM) PATCH, ONE PATCH DAILY USING THE FOLLOWING DIRECTIONS:  APPLY ONE 21 MG PATCH DAILY FOR 6 WEEKS THEN, APPLY ONE 14 MG PATCH DAILY FOR 2-4 WEEKS THEN, APPLY ONE 7 MG PATCH DAILY FOR 2 WEEKS 28 patch 0  . nicotine (NICODERM CQ) 7 mg/24hr patch Place 1 patch (7 mg total) onto the skin  daily. START NICOTINE (NOICODERM) PATCH, ONE PATCH DAILY USING THE FOLLOWING DIRECTIONS:  APPLY ONE 21 MG PATCH DAILY FOR 6 WEEKS THEN, APPLY ONE 14 MG PATCH DAILY FOR 2-4 WEEKS THEN, APPLY ONE 7 MG PATCH DAILY FOR 2 WEEKS 28 patch 0  . pantoprazole (PROTONIX) 20 MG tablet Take 1 tablet (20 mg total) by mouth 2 (two) times daily before a meal. 60 tablet 5  . oxyCODONE-acetaminophen (PERCOCET) 5-325 MG tablet Take 1 tablet by mouth every 6 (six) hours as needed for severe pain. (Patient not taking: Reported on 08/11/2018) 8 tablet 0   No current facility-administered medications for this visit.      REVIEW OF SYSTEMS: see HPI for pertinent positives and negatives    PHYSICAL EXAMINATION:  Vitals:   08/11/18 1519  BP: (!) 146/95  Pulse: 88  Resp: 14  Temp: (!) 97.5 F (36.4 C)  TempSrc: Oral  SpO2: 100%  Weight: 125 lb (56.7 kg)  Height: 5\' 7"  (1.702 m)   Body mass index is 19.58 kg/m.   Non-Invasive Vascular Imaging  Left arm Access Duplex  (Date: 08/11/2018):  Findings: +--------------------+----------+-----------------+--------+ AVF                 PSV (cm/s)Flow Vol (mL/min)Comments +--------------------+----------+-----------------+--------+ Native artery inflow   126          1402                +--------------------+----------+-----------------+--------+ AVF Anastomosis        235                              +--------------------+----------+-----------------+--------+   +------------+----------+-------------+-----------+----------------------------+ OUTFLOW VEINPSV (cm/s)Diameter (cm)Depth (cm)           Describe           +------------+----------+-------------+-----------+----------------------------+ Shoulder       207        0.55        0.50                                 +------------+----------+-------------+-----------+----------------------------+ Prox UA        176        0.50        0.18                                  +------------+----------+-------------+-----------+----------------------------+ Mid UA         205        0.52        0.29                                 +------------+----------+-------------+-----------+----------------------------+ Dist UA     292 / 633  0.92 / 0.63 0.12 / 0.15  partially-occlusive and                                                       narrowing / fluid       +------------+----------+-------------+-----------+----------------------------+ Summary: Patent left Brachial-cephalic fistula with partially occlusive plaque in the distal upper arm as well as narrowing measuring approximately 1.40 cms in length. Fluid visualized anteriorally measuring 2.10 x 0.66 cms. Increased velocity at area of plaque  noted as well.   Medical Decision Making  Karen Morrow is a 58 y.o. female who is s/p left brachiocephalic arteriovenous fistula by Dr. Donzetta Matters (Date: 05/17/18).    Pt had the left arm AVF duplex performed today, but left before I could examine her. I attempted to call pt on her mobile phone four times, each time a busy signal was heard, no voicemail available. I left a message on her home phone voicemail that I tried to reach her several times on her mobile phone.   I discussed with Dr. Donzetta Matters the diameters and depths of her AVF. Will schedule pt to return in a month to  evaluate her, no duplex needed at that time.    Clemon Chambers, RN, MSN, FNP-C [facility] Office: [phone]  08/11/2018, 4:54 PM  Clinic MD: Donzetta Matters

## 2018-08-11 NOTE — Addendum Note (Signed)
Addended by: Viann Fish on: 08/11/2018 05:41 PM   Modules accepted: Level of Service

## 2018-08-11 NOTE — Addendum Note (Signed)
Addended by: Viann Fish on: 08/11/2018 05:42 PM   Modules accepted: Level of Service

## 2018-08-12 ENCOUNTER — Other Ambulatory Visit: Payer: Self-pay | Admitting: Physician Assistant

## 2018-08-12 DIAGNOSIS — N186 End stage renal disease: Secondary | ICD-10-CM | POA: Diagnosis not present

## 2018-08-12 DIAGNOSIS — R0602 Shortness of breath: Secondary | ICD-10-CM

## 2018-08-12 DIAGNOSIS — Z992 Dependence on renal dialysis: Secondary | ICD-10-CM | POA: Diagnosis not present

## 2018-08-12 DIAGNOSIS — D509 Iron deficiency anemia, unspecified: Secondary | ICD-10-CM | POA: Diagnosis not present

## 2018-08-12 DIAGNOSIS — N2581 Secondary hyperparathyroidism of renal origin: Secondary | ICD-10-CM | POA: Diagnosis not present

## 2018-08-12 DIAGNOSIS — D631 Anemia in chronic kidney disease: Secondary | ICD-10-CM | POA: Diagnosis not present

## 2018-08-15 DIAGNOSIS — D509 Iron deficiency anemia, unspecified: Secondary | ICD-10-CM | POA: Diagnosis not present

## 2018-08-15 DIAGNOSIS — N2581 Secondary hyperparathyroidism of renal origin: Secondary | ICD-10-CM | POA: Diagnosis not present

## 2018-08-15 DIAGNOSIS — D631 Anemia in chronic kidney disease: Secondary | ICD-10-CM | POA: Diagnosis not present

## 2018-08-15 DIAGNOSIS — N186 End stage renal disease: Secondary | ICD-10-CM | POA: Diagnosis not present

## 2018-08-15 DIAGNOSIS — Z992 Dependence on renal dialysis: Secondary | ICD-10-CM | POA: Diagnosis not present

## 2018-08-17 DIAGNOSIS — Z992 Dependence on renal dialysis: Secondary | ICD-10-CM | POA: Diagnosis not present

## 2018-08-17 DIAGNOSIS — D631 Anemia in chronic kidney disease: Secondary | ICD-10-CM | POA: Diagnosis not present

## 2018-08-17 DIAGNOSIS — D509 Iron deficiency anemia, unspecified: Secondary | ICD-10-CM | POA: Diagnosis not present

## 2018-08-17 DIAGNOSIS — N186 End stage renal disease: Secondary | ICD-10-CM | POA: Diagnosis not present

## 2018-08-17 DIAGNOSIS — N2581 Secondary hyperparathyroidism of renal origin: Secondary | ICD-10-CM | POA: Diagnosis not present

## 2018-08-19 DIAGNOSIS — Z992 Dependence on renal dialysis: Secondary | ICD-10-CM | POA: Diagnosis not present

## 2018-08-19 DIAGNOSIS — D631 Anemia in chronic kidney disease: Secondary | ICD-10-CM | POA: Diagnosis not present

## 2018-08-19 DIAGNOSIS — N186 End stage renal disease: Secondary | ICD-10-CM | POA: Diagnosis not present

## 2018-08-19 DIAGNOSIS — D509 Iron deficiency anemia, unspecified: Secondary | ICD-10-CM | POA: Diagnosis not present

## 2018-08-19 DIAGNOSIS — N2581 Secondary hyperparathyroidism of renal origin: Secondary | ICD-10-CM | POA: Diagnosis not present

## 2018-08-22 DIAGNOSIS — D631 Anemia in chronic kidney disease: Secondary | ICD-10-CM | POA: Diagnosis not present

## 2018-08-22 DIAGNOSIS — D509 Iron deficiency anemia, unspecified: Secondary | ICD-10-CM | POA: Diagnosis not present

## 2018-08-22 DIAGNOSIS — Z992 Dependence on renal dialysis: Secondary | ICD-10-CM | POA: Diagnosis not present

## 2018-08-22 DIAGNOSIS — N2581 Secondary hyperparathyroidism of renal origin: Secondary | ICD-10-CM | POA: Diagnosis not present

## 2018-08-22 DIAGNOSIS — N186 End stage renal disease: Secondary | ICD-10-CM | POA: Diagnosis not present

## 2018-08-24 DIAGNOSIS — N2581 Secondary hyperparathyroidism of renal origin: Secondary | ICD-10-CM | POA: Diagnosis not present

## 2018-08-24 DIAGNOSIS — Z992 Dependence on renal dialysis: Secondary | ICD-10-CM | POA: Diagnosis not present

## 2018-08-24 DIAGNOSIS — D631 Anemia in chronic kidney disease: Secondary | ICD-10-CM | POA: Diagnosis not present

## 2018-08-24 DIAGNOSIS — D509 Iron deficiency anemia, unspecified: Secondary | ICD-10-CM | POA: Diagnosis not present

## 2018-08-24 DIAGNOSIS — N186 End stage renal disease: Secondary | ICD-10-CM | POA: Diagnosis not present

## 2018-08-26 DIAGNOSIS — D509 Iron deficiency anemia, unspecified: Secondary | ICD-10-CM | POA: Diagnosis not present

## 2018-08-26 DIAGNOSIS — N2581 Secondary hyperparathyroidism of renal origin: Secondary | ICD-10-CM | POA: Diagnosis not present

## 2018-08-26 DIAGNOSIS — Z992 Dependence on renal dialysis: Secondary | ICD-10-CM | POA: Diagnosis not present

## 2018-08-26 DIAGNOSIS — D631 Anemia in chronic kidney disease: Secondary | ICD-10-CM | POA: Diagnosis not present

## 2018-08-26 DIAGNOSIS — N186 End stage renal disease: Secondary | ICD-10-CM | POA: Diagnosis not present

## 2018-08-29 DIAGNOSIS — N2581 Secondary hyperparathyroidism of renal origin: Secondary | ICD-10-CM | POA: Diagnosis not present

## 2018-08-29 DIAGNOSIS — N186 End stage renal disease: Secondary | ICD-10-CM | POA: Diagnosis not present

## 2018-08-29 DIAGNOSIS — D631 Anemia in chronic kidney disease: Secondary | ICD-10-CM | POA: Diagnosis not present

## 2018-08-29 DIAGNOSIS — Z992 Dependence on renal dialysis: Secondary | ICD-10-CM | POA: Diagnosis not present

## 2018-08-29 DIAGNOSIS — D509 Iron deficiency anemia, unspecified: Secondary | ICD-10-CM | POA: Diagnosis not present

## 2018-08-30 DIAGNOSIS — N186 End stage renal disease: Secondary | ICD-10-CM | POA: Diagnosis not present

## 2018-08-30 DIAGNOSIS — I120 Hypertensive chronic kidney disease with stage 5 chronic kidney disease or end stage renal disease: Secondary | ICD-10-CM | POA: Diagnosis not present

## 2018-08-30 DIAGNOSIS — Z992 Dependence on renal dialysis: Secondary | ICD-10-CM | POA: Diagnosis not present

## 2018-08-31 DIAGNOSIS — N186 End stage renal disease: Secondary | ICD-10-CM | POA: Diagnosis not present

## 2018-08-31 DIAGNOSIS — A4901 Methicillin susceptible Staphylococcus aureus infection, unspecified site: Secondary | ICD-10-CM | POA: Diagnosis not present

## 2018-08-31 DIAGNOSIS — Z992 Dependence on renal dialysis: Secondary | ICD-10-CM | POA: Diagnosis not present

## 2018-08-31 DIAGNOSIS — N2581 Secondary hyperparathyroidism of renal origin: Secondary | ICD-10-CM | POA: Diagnosis not present

## 2018-08-31 DIAGNOSIS — D631 Anemia in chronic kidney disease: Secondary | ICD-10-CM | POA: Diagnosis not present

## 2018-08-31 DIAGNOSIS — K7460 Unspecified cirrhosis of liver: Secondary | ICD-10-CM | POA: Diagnosis not present

## 2018-09-02 DIAGNOSIS — N2581 Secondary hyperparathyroidism of renal origin: Secondary | ICD-10-CM | POA: Diagnosis not present

## 2018-09-02 DIAGNOSIS — Z992 Dependence on renal dialysis: Secondary | ICD-10-CM | POA: Diagnosis not present

## 2018-09-02 DIAGNOSIS — D631 Anemia in chronic kidney disease: Secondary | ICD-10-CM | POA: Diagnosis not present

## 2018-09-02 DIAGNOSIS — K7460 Unspecified cirrhosis of liver: Secondary | ICD-10-CM | POA: Diagnosis not present

## 2018-09-02 DIAGNOSIS — A4901 Methicillin susceptible Staphylococcus aureus infection, unspecified site: Secondary | ICD-10-CM | POA: Diagnosis not present

## 2018-09-02 DIAGNOSIS — N186 End stage renal disease: Secondary | ICD-10-CM | POA: Diagnosis not present

## 2018-09-05 DIAGNOSIS — D631 Anemia in chronic kidney disease: Secondary | ICD-10-CM | POA: Diagnosis not present

## 2018-09-05 DIAGNOSIS — A4901 Methicillin susceptible Staphylococcus aureus infection, unspecified site: Secondary | ICD-10-CM | POA: Diagnosis not present

## 2018-09-05 DIAGNOSIS — Z992 Dependence on renal dialysis: Secondary | ICD-10-CM | POA: Diagnosis not present

## 2018-09-05 DIAGNOSIS — N186 End stage renal disease: Secondary | ICD-10-CM | POA: Diagnosis not present

## 2018-09-05 DIAGNOSIS — K7460 Unspecified cirrhosis of liver: Secondary | ICD-10-CM | POA: Diagnosis not present

## 2018-09-05 DIAGNOSIS — N2581 Secondary hyperparathyroidism of renal origin: Secondary | ICD-10-CM | POA: Diagnosis not present

## 2018-09-07 DIAGNOSIS — K7460 Unspecified cirrhosis of liver: Secondary | ICD-10-CM | POA: Diagnosis not present

## 2018-09-07 DIAGNOSIS — N186 End stage renal disease: Secondary | ICD-10-CM | POA: Diagnosis not present

## 2018-09-07 DIAGNOSIS — D631 Anemia in chronic kidney disease: Secondary | ICD-10-CM | POA: Diagnosis not present

## 2018-09-07 DIAGNOSIS — N2581 Secondary hyperparathyroidism of renal origin: Secondary | ICD-10-CM | POA: Diagnosis not present

## 2018-09-07 DIAGNOSIS — A4901 Methicillin susceptible Staphylococcus aureus infection, unspecified site: Secondary | ICD-10-CM | POA: Diagnosis not present

## 2018-09-07 DIAGNOSIS — Z992 Dependence on renal dialysis: Secondary | ICD-10-CM | POA: Diagnosis not present

## 2018-09-09 DIAGNOSIS — K7460 Unspecified cirrhosis of liver: Secondary | ICD-10-CM | POA: Diagnosis not present

## 2018-09-09 DIAGNOSIS — N186 End stage renal disease: Secondary | ICD-10-CM | POA: Diagnosis not present

## 2018-09-09 DIAGNOSIS — D631 Anemia in chronic kidney disease: Secondary | ICD-10-CM | POA: Diagnosis not present

## 2018-09-09 DIAGNOSIS — A4901 Methicillin susceptible Staphylococcus aureus infection, unspecified site: Secondary | ICD-10-CM | POA: Diagnosis not present

## 2018-09-09 DIAGNOSIS — N2581 Secondary hyperparathyroidism of renal origin: Secondary | ICD-10-CM | POA: Diagnosis not present

## 2018-09-09 DIAGNOSIS — Z992 Dependence on renal dialysis: Secondary | ICD-10-CM | POA: Diagnosis not present

## 2018-09-12 DIAGNOSIS — N186 End stage renal disease: Secondary | ICD-10-CM | POA: Diagnosis not present

## 2018-09-12 DIAGNOSIS — K7460 Unspecified cirrhosis of liver: Secondary | ICD-10-CM | POA: Diagnosis not present

## 2018-09-12 DIAGNOSIS — D631 Anemia in chronic kidney disease: Secondary | ICD-10-CM | POA: Diagnosis not present

## 2018-09-12 DIAGNOSIS — A4901 Methicillin susceptible Staphylococcus aureus infection, unspecified site: Secondary | ICD-10-CM | POA: Diagnosis not present

## 2018-09-12 DIAGNOSIS — N2581 Secondary hyperparathyroidism of renal origin: Secondary | ICD-10-CM | POA: Diagnosis not present

## 2018-09-12 DIAGNOSIS — Z992 Dependence on renal dialysis: Secondary | ICD-10-CM | POA: Diagnosis not present

## 2018-09-13 ENCOUNTER — Telehealth (HOSPITAL_COMMUNITY): Payer: Self-pay | Admitting: Rehabilitation

## 2018-09-13 NOTE — Telephone Encounter (Signed)
The above patient or their representative was contacted and gave the following answers to these questions:         Do you have any of the following symptoms? No  Fever                    Cough                   Shortness of breath  Do  you have any of the following other symptoms? No   muscle pain         vomiting,        diarrhea        rash         weakness        red eye        abdominal pain         bruising          bruising or bleeding              joint pain           severe headache    Have you been in contact with someone who was or has been sick in the past 2 weeks? No  Yes                 Unsure                         Unable to assess   Does the person that you were in contact with have any of the following symptoms? n/a  Cough         shortness of breath           muscle pain         vomiting,            diarrhea            rash            weakness           fever            red eye           abdominal pain           bruising  or  bleeding                joint pain                severe headache               Have you  or someone you have been in contact with traveled internationally in th last month? No         If yes, which countries? n/a   Have you  or someone you have been in contact with traveled outside [place] in th last month? No        If yes, which state and city? n/a   COMMENTS OR ACTION PLAN FOR THIS PATIENT:          

## 2018-09-14 ENCOUNTER — Ambulatory Visit: Payer: Medicare Other | Admitting: Family

## 2018-09-15 ENCOUNTER — Other Ambulatory Visit: Payer: Self-pay

## 2018-09-15 ENCOUNTER — Inpatient Hospital Stay (HOSPITAL_COMMUNITY)
Admission: EM | Admit: 2018-09-15 | Discharge: 2018-09-19 | DRG: 314 | Disposition: A | Discharge status: Home / Self Care | Payer: Medicare Other | Attending: Internal Medicine | Admitting: Internal Medicine

## 2018-09-15 ENCOUNTER — Ambulatory Visit: Payer: Medicare Other | Admitting: Family

## 2018-09-15 ENCOUNTER — Encounter (HOSPITAL_COMMUNITY): Payer: Self-pay | Admitting: Emergency Medicine

## 2018-09-15 ENCOUNTER — Emergency Department (HOSPITAL_COMMUNITY): Payer: Medicare Other

## 2018-09-15 DIAGNOSIS — B1920 Unspecified viral hepatitis C without hepatic coma: Secondary | ICD-10-CM | POA: Diagnosis present

## 2018-09-15 DIAGNOSIS — J45909 Unspecified asthma, uncomplicated: Secondary | ICD-10-CM | POA: Diagnosis present

## 2018-09-15 DIAGNOSIS — Z681 Body mass index (BMI) 19 or less, adult: Secondary | ICD-10-CM

## 2018-09-15 DIAGNOSIS — R0602 Shortness of breath: Secondary | ICD-10-CM | POA: Diagnosis not present

## 2018-09-15 DIAGNOSIS — F17210 Nicotine dependence, cigarettes, uncomplicated: Secondary | ICD-10-CM | POA: Diagnosis present

## 2018-09-15 DIAGNOSIS — R000 Tachycardia, unspecified: Secondary | ICD-10-CM | POA: Diagnosis not present

## 2018-09-15 DIAGNOSIS — Z8249 Family history of ischemic heart disease and other diseases of the circulatory system: Secondary | ICD-10-CM

## 2018-09-15 DIAGNOSIS — A4101 Sepsis due to Methicillin susceptible Staphylococcus aureus: Secondary | ICD-10-CM | POA: Diagnosis present

## 2018-09-15 DIAGNOSIS — K257 Chronic gastric ulcer without hemorrhage or perforation: Secondary | ICD-10-CM | POA: Diagnosis present

## 2018-09-15 DIAGNOSIS — R195 Other fecal abnormalities: Secondary | ICD-10-CM | POA: Diagnosis not present

## 2018-09-15 DIAGNOSIS — K259 Gastric ulcer, unspecified as acute or chronic, without hemorrhage or perforation: Secondary | ICD-10-CM | POA: Diagnosis present

## 2018-09-15 DIAGNOSIS — R6520 Severe sepsis without septic shock: Secondary | ICD-10-CM | POA: Diagnosis not present

## 2018-09-15 DIAGNOSIS — I120 Hypertensive chronic kidney disease with stage 5 chronic kidney disease or end stage renal disease: Secondary | ICD-10-CM | POA: Diagnosis not present

## 2018-09-15 DIAGNOSIS — N186 End stage renal disease: Secondary | ICD-10-CM | POA: Diagnosis present

## 2018-09-15 DIAGNOSIS — Z20828 Contact with and (suspected) exposure to other viral communicable diseases: Secondary | ICD-10-CM | POA: Diagnosis present

## 2018-09-15 DIAGNOSIS — J9601 Acute respiratory failure with hypoxia: Secondary | ICD-10-CM | POA: Diagnosis not present

## 2018-09-15 DIAGNOSIS — I10 Essential (primary) hypertension: Secondary | ICD-10-CM | POA: Diagnosis not present

## 2018-09-15 DIAGNOSIS — K210 Gastro-esophageal reflux disease with esophagitis: Secondary | ICD-10-CM | POA: Diagnosis present

## 2018-09-15 DIAGNOSIS — G319 Degenerative disease of nervous system, unspecified: Secondary | ICD-10-CM | POA: Diagnosis present

## 2018-09-15 DIAGNOSIS — K922 Gastrointestinal hemorrhage, unspecified: Secondary | ICD-10-CM | POA: Diagnosis not present

## 2018-09-15 DIAGNOSIS — D631 Anemia in chronic kidney disease: Secondary | ICD-10-CM | POA: Diagnosis present

## 2018-09-15 DIAGNOSIS — K254 Chronic or unspecified gastric ulcer with hemorrhage: Secondary | ICD-10-CM | POA: Diagnosis not present

## 2018-09-15 DIAGNOSIS — K56699 Other intestinal obstruction unspecified as to partial versus complete obstruction: Secondary | ICD-10-CM | POA: Diagnosis not present

## 2018-09-15 DIAGNOSIS — D649 Anemia, unspecified: Secondary | ICD-10-CM | POA: Diagnosis not present

## 2018-09-15 DIAGNOSIS — K209 Esophagitis, unspecified: Secondary | ICD-10-CM | POA: Diagnosis not present

## 2018-09-15 DIAGNOSIS — D62 Acute posthemorrhagic anemia: Secondary | ICD-10-CM | POA: Diagnosis not present

## 2018-09-15 DIAGNOSIS — R6510 Systemic inflammatory response syndrome (SIRS) of non-infectious origin without acute organ dysfunction: Secondary | ICD-10-CM

## 2018-09-15 DIAGNOSIS — N2581 Secondary hyperparathyroidism of renal origin: Secondary | ICD-10-CM | POA: Diagnosis present

## 2018-09-15 DIAGNOSIS — K5710 Diverticulosis of small intestine without perforation or abscess without bleeding: Secondary | ICD-10-CM | POA: Diagnosis present

## 2018-09-15 DIAGNOSIS — D696 Thrombocytopenia, unspecified: Secondary | ICD-10-CM | POA: Diagnosis not present

## 2018-09-15 DIAGNOSIS — R531 Weakness: Secondary | ICD-10-CM | POA: Diagnosis not present

## 2018-09-15 DIAGNOSIS — T827XXA Infection and inflammatory reaction due to other cardiac and vascular devices, implants and grafts, initial encounter: Principal | ICD-10-CM | POA: Diagnosis present

## 2018-09-15 DIAGNOSIS — A419 Sepsis, unspecified organism: Secondary | ICD-10-CM | POA: Diagnosis not present

## 2018-09-15 DIAGNOSIS — G9341 Metabolic encephalopathy: Secondary | ICD-10-CM | POA: Diagnosis not present

## 2018-09-15 DIAGNOSIS — E873 Alkalosis: Secondary | ICD-10-CM | POA: Diagnosis present

## 2018-09-15 DIAGNOSIS — K760 Fatty (change of) liver, not elsewhere classified: Secondary | ICD-10-CM | POA: Diagnosis present

## 2018-09-15 DIAGNOSIS — Z992 Dependence on renal dialysis: Secondary | ICD-10-CM | POA: Diagnosis not present

## 2018-09-15 DIAGNOSIS — D509 Iron deficiency anemia, unspecified: Secondary | ICD-10-CM | POA: Diagnosis not present

## 2018-09-15 DIAGNOSIS — Z833 Family history of diabetes mellitus: Secondary | ICD-10-CM

## 2018-09-15 DIAGNOSIS — Z832 Family history of diseases of the blood and blood-forming organs and certain disorders involving the immune mechanism: Secondary | ICD-10-CM

## 2018-09-15 DIAGNOSIS — I132 Hypertensive heart and chronic kidney disease with heart failure and with stage 5 chronic kidney disease, or end stage renal disease: Secondary | ICD-10-CM | POA: Diagnosis present

## 2018-09-15 DIAGNOSIS — R404 Transient alteration of awareness: Secondary | ICD-10-CM | POA: Diagnosis not present

## 2018-09-15 DIAGNOSIS — E871 Hypo-osmolality and hyponatremia: Secondary | ICD-10-CM | POA: Diagnosis present

## 2018-09-15 DIAGNOSIS — G9340 Encephalopathy, unspecified: Secondary | ICD-10-CM | POA: Diagnosis present

## 2018-09-15 DIAGNOSIS — R0689 Other abnormalities of breathing: Secondary | ICD-10-CM | POA: Diagnosis not present

## 2018-09-15 DIAGNOSIS — I5032 Chronic diastolic (congestive) heart failure: Secondary | ICD-10-CM | POA: Diagnosis present

## 2018-09-15 DIAGNOSIS — Z720 Tobacco use: Secondary | ICD-10-CM

## 2018-09-15 DIAGNOSIS — R4182 Altered mental status, unspecified: Secondary | ICD-10-CM

## 2018-09-15 DIAGNOSIS — D500 Iron deficiency anemia secondary to blood loss (chronic): Secondary | ICD-10-CM | POA: Diagnosis not present

## 2018-09-15 DIAGNOSIS — Z8711 Personal history of peptic ulcer disease: Secondary | ICD-10-CM

## 2018-09-15 DIAGNOSIS — Z881 Allergy status to other antibiotic agents status: Secondary | ICD-10-CM | POA: Diagnosis not present

## 2018-09-15 DIAGNOSIS — Z90710 Acquired absence of both cervix and uterus: Secondary | ICD-10-CM

## 2018-09-15 DIAGNOSIS — K219 Gastro-esophageal reflux disease without esophagitis: Secondary | ICD-10-CM | POA: Diagnosis present

## 2018-09-15 DIAGNOSIS — I770 Arteriovenous fistula, acquired: Secondary | ICD-10-CM | POA: Diagnosis not present

## 2018-09-15 DIAGNOSIS — K625 Hemorrhage of anus and rectum: Secondary | ICD-10-CM | POA: Diagnosis not present

## 2018-09-15 DIAGNOSIS — K449 Diaphragmatic hernia without obstruction or gangrene: Secondary | ICD-10-CM | POA: Diagnosis not present

## 2018-09-15 DIAGNOSIS — E8889 Other specified metabolic disorders: Secondary | ICD-10-CM | POA: Diagnosis present

## 2018-09-15 DIAGNOSIS — Z8619 Personal history of other infectious and parasitic diseases: Secondary | ICD-10-CM | POA: Diagnosis not present

## 2018-09-15 DIAGNOSIS — Z79891 Long term (current) use of opiate analgesic: Secondary | ICD-10-CM

## 2018-09-15 LAB — CBC WITH DIFFERENTIAL/PLATELET
Abs Immature Granulocytes: 0.05 10*3/uL (ref 0.00–0.07)
Basophils Absolute: 0 10*3/uL (ref 0.0–0.1)
Basophils Relative: 0 %
Eosinophils Absolute: 0 10*3/uL (ref 0.0–0.5)
Eosinophils Relative: 0 %
HCT: 16.8 % — ABNORMAL LOW (ref 36.0–46.0)
Hemoglobin: 5.3 g/dL — CL (ref 12.0–15.0)
Immature Granulocytes: 1 %
Lymphocytes Relative: 7 %
Lymphs Abs: 0.6 10*3/uL — ABNORMAL LOW (ref 0.7–4.0)
MCH: 29.4 pg (ref 26.0–34.0)
MCHC: 31.5 g/dL (ref 30.0–36.0)
MCV: 93.3 fL (ref 80.0–100.0)
Monocytes Absolute: 0.3 10*3/uL (ref 0.1–1.0)
Monocytes Relative: 4 %
Neutro Abs: 7.9 10*3/uL — ABNORMAL HIGH (ref 1.7–7.7)
Neutrophils Relative %: 88 %
Platelets: 168 10*3/uL (ref 150–400)
RBC: 1.8 MIL/uL — ABNORMAL LOW (ref 3.87–5.11)
RDW: 16.2 % — ABNORMAL HIGH (ref 11.5–15.5)
WBC: 8.9 10*3/uL (ref 4.0–10.5)
nRBC: 0 % (ref 0.0–0.2)

## 2018-09-15 LAB — COMPREHENSIVE METABOLIC PANEL
ALT: 20 U/L (ref 0–44)
AST: 29 U/L (ref 15–41)
Albumin: 2.6 g/dL — ABNORMAL LOW (ref 3.5–5.0)
Alkaline Phosphatase: 64 U/L (ref 38–126)
Anion gap: 16 — ABNORMAL HIGH (ref 5–15)
BUN: 65 mg/dL — ABNORMAL HIGH (ref 6–20)
CO2: 20 mmol/L — ABNORMAL LOW (ref 22–32)
Calcium: 9.1 mg/dL (ref 8.9–10.3)
Chloride: 99 mmol/L (ref 98–111)
Creatinine, Ser: 9.73 mg/dL — ABNORMAL HIGH (ref 0.44–1.00)
GFR calc Af Amer: 5 mL/min — ABNORMAL LOW (ref >60)
GFR calc non Af Amer: 4 mL/min — ABNORMAL LOW (ref >60)
Glucose, Bld: 87 mg/dL (ref 70–99)
Potassium: 4.1 mmol/L (ref 3.5–5.1)
Sodium: 135 mmol/L (ref 135–145)
Total Bilirubin: 0.8 mg/dL (ref 0.3–1.2)
Total Protein: 5.5 g/dL — ABNORMAL LOW (ref 6.5–8.1)

## 2018-09-15 LAB — LACTIC ACID, PLASMA: Lactic Acid, Venous: 1.9 mmol/L (ref 0.5–1.9)

## 2018-09-15 LAB — PROTIME-INR
INR: 1 (ref 0.8–1.2)
Prothrombin Time: 13.5 seconds (ref 11.4–15.2)

## 2018-09-15 LAB — SARS CORONAVIRUS 2 BY RT PCR (HOSPITAL ORDER, PERFORMED IN ~~LOC~~ HOSPITAL LAB): SARS Coronavirus 2: NEGATIVE

## 2018-09-15 LAB — PROCALCITONIN: Procalcitonin: 0.85 ng/mL

## 2018-09-15 LAB — BRAIN NATRIURETIC PEPTIDE: B Natriuretic Peptide: 3309.9 pg/mL — ABNORMAL HIGH (ref 0.0–100.0)

## 2018-09-15 LAB — POC OCCULT BLOOD, ED: Fecal Occult Bld: POSITIVE — AB

## 2018-09-15 LAB — AMMONIA: Ammonia: 19 umol/L (ref 9–35)

## 2018-09-15 LAB — LIPASE, BLOOD: Lipase: 34 U/L (ref 11–51)

## 2018-09-15 MED ORDER — ACETAMINOPHEN 650 MG RE SUPP
650.0000 mg | Freq: Once | RECTAL | Status: AC
  Administered 2018-09-15: 22:00:00 650 mg via RECTAL
  Filled 2018-09-15: qty 1

## 2018-09-15 MED ORDER — SODIUM CHLORIDE 0.9 % IV SOLN: 2.0000 g | Freq: Once | INTRAVENOUS | Status: DC

## 2018-09-15 MED ORDER — VANCOMYCIN HCL 10 G IV SOLR
1250.0000 mg | Freq: Once | INTRAVENOUS | Status: AC
  Administered 2018-09-15: 1250 mg via INTRAVENOUS
  Filled 2018-09-15: qty 1250

## 2018-09-15 MED ORDER — SODIUM CHLORIDE 0.9 % IV SOLN
2.0000 g | Freq: Once | INTRAVENOUS | Status: AC
  Administered 2018-09-15: 2 g via INTRAVENOUS
  Filled 2018-09-15: qty 2

## 2018-09-15 MED ORDER — VANCOMYCIN HCL IN DEXTROSE 1-5 GM/200ML-% IV SOLN: 1000.0000 mg | Freq: Once | INTRAVENOUS | Status: DC

## 2018-09-15 MED ORDER — SODIUM CHLORIDE 0.9% IV SOLUTION
Freq: Once | INTRAVENOUS | Status: AC
  Administered 2018-09-16: 02:00:00 via INTRAVENOUS

## 2018-09-15 NOTE — ED Triage Notes (Signed)
  Patient BIB EMS for AMS and fever.  Family states that patient missed dialysis appt yesterday and started having fever, SOB, cough, and chills this evening.  Patient has AMS and can barely stay awake to answer questions.  Temp 103.2 with HR 140s and 38-44 RR.  Patient does respond to painful stimulus and will answer some questions.   MD at bedside.

## 2018-09-15 NOTE — ED Notes (Signed)
Xray currently in room. 

## 2018-09-15 NOTE — Progress Notes (Signed)
Pharmacy Antibiotic Note  Karen Morrow is a 58 y.o. female admitted on 09/15/2018 with bacteremia.    Hx of cefazolin allergy in chart but related to TCP, does tolerate other agents  Plan: Cefepime 2 g x 1 Vanc 1250 mg x 1 Monitor HD schedule for further dosing, cx, vanc lvls prn     Temp (24hrs), Avg:103.2 F (39.6 C), Min:103.2 F (39.6 C), Max:103.2 F (39.6 C)  No results for input(s): WBC, CREATININE, LATICACIDVEN, VANCOTROUGH, VANCOPEAK, VANCORANDOM, GENTTROUGH, GENTPEAK, GENTRANDOM, TOBRATROUGH, TOBRAPEAK, TOBRARND, AMIKACINPEAK, AMIKACINTROU, AMIKACIN in the last 168 hours.  CrCl cannot be calculated (Patient's most recent lab result is older than the maximum 21 days allowed.).    Allergies  Allergen Reactions  . Cefazolin Other (See Comments)    Severe thrombocytopenia (has tolerated zosyn in the past) ID aware   Levester Fresh, PharmD, BCPS, BCCCP Clinical Pharmacist [phone]  Please check AMION for all [place] numbers  09/15/2018 9:31 PM

## 2018-09-15 NOTE — ED Provider Notes (Signed)
[facility] EMERGENCY DEPARTMENT Provider Note   CSN: [id number] Arrival date & time: 09/15/18  2055    History   Chief Complaint No chief complaint on file.   HPI Karen Morrow is a 58 y.o. female.     HPI 58 year old woman with ESRD presents to the emergency department by EMS for fever to 101.9 Fahrenheit, cough, shortness of breath, altered mental status.  I was unable to speak to the EMS crew prior to them leaving so I was unable to obtain further history.  I have attempted to call family members without success at this time.  Patient only answering to name at this time.  Not following commands but eyes are open. Past Medical History:  Diagnosis Date  . Anginal pain ([place])   . Asthma   . CHF (congestive heart failure) ([place])   . Chronic kidney disease    dialysis 3x wk  . Dyspnea   . Frequent bowel movements   . GERD (gastroesophageal reflux disease)   . Hepatitis C antibody test positive   . Hypertension    "just dx'd today" (11/03/2012)    Patient Active Problem List   Diagnosis Date Noted  . Chronic diastolic (congestive) heart failure ([place]) 07/07/2018  . Aortic insufficiency 07/07/2018  . Hypertensive urgency 05/13/2018  . Infection of arteriovenous fistula ([place]) 04/25/2018  . MSSA bacteremia 04/25/2018  . Bacteremia   . Staphylococcal sepsis ([place])   . Sepsis ([place]) 03/10/2018  . Hyperkalemia   . Acute respiratory distress   . Pulmonary hypertension due to left ventricular diastolic dysfunction ([place]) 11/08/2016  . Chronic cough 10/15/2016  . Acute GI bleeding 09/07/2016  . Gastroesophageal reflux disease 04/16/2016  . Anemia in chronic kidney disease, on chronic dialysis ([place]) 04/16/2016  . Primary insomnia 04/16/2016  . Shortness of breath 04/16/2016  . Pruritus 12/11/2014  . Hordeolum externum (stye) 12/11/2014  . Xanthelasma of right upper eyelid 12/11/2014  . Dermatitis 10/30/2014  . ESRD (end stage renal disease) ([place]) 02/25/2014   . Malnutrition of moderate degree ([place]) 11/28/2013  . HTN (hypertension) 11/26/2013  . Gastric ulcer 06/07/2013  . Chest pain 11/03/2012  . Thrombocytopenia ([place]) 11/03/2012  . Elevated transaminase level 11/03/2012  . 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 Hepatitis C antibody test positive     Past Surgical History:  Procedure Laterality Date  . ABDOMINAL HYSTERECTOMY     "partial" (11/03/2012)  . AV FISTULA PLACEMENT Right 12/03/2013   Procedure: CREATION OF ARTERIOVENOUS (AV) FISTULA RIGHT ARM ;  Surgeon: Rosetta Posner, MD;  Location: [place];  Service: Vascular;  Laterality: Right;  . AV FISTULA PLACEMENT Left 05/17/2018   Procedure: CREATION OF BRACHIOCEPHALIC FISTULA LEFT ARM;  Surgeon: Waynetta Sandy, MD;  Location: [place];  Service: Vascular;  Laterality: Left;  . [place] REMOVAL Right 03/16/2018   Procedure: REMOVAL OF ARTERIOVENOUS GORETEX GRAFT ([place]) RIGHT ARM;  Surgeon: Waynetta Sandy, MD;  Location: [place];  Service: Vascular;  Laterality: Right;  . BASCILIC VEIN TRANSPOSITION Right 03/06/2014   Procedure: SECOND STAGE BASILIC VEIN TRANSPOSITION;  Surgeon: Rosetta Posner, MD;  Location: [facility] OR;  Service: Vascular;  Laterality: Right;  . ESOPHAGOGASTRODUODENOSCOPY N/A 06/05/2013   Procedure: ESOPHAGOGASTRODUODENOSCOPY (EGD);  Surgeon: Winfield Cunas., MD;  Location: [facility];  Service: Endoscopy;  Laterality: N/A;  . ESOPHAGOGASTRODUODENOSCOPY N/A 09/08/2016   Procedure: ESOPHAGOGASTRODUODENOSCOPY (EGD);  Surgeon: Milus Banister, MD;  Location: [place];  Service: Endoscopy;  Laterality: N/A;  . INSERTION OF DIALYSIS CATHETER Left 03/16/2018   Procedure: PLACEMENT OF TUNNEL DIALYSIS CATHETER;  Surgeon: Waynetta Sandy, MD;  Location: [place];  Service: Vascular;  Laterality: Left;  . REVISON OF ARTERIOVENOUS FISTULA Right 01/28/2017   Procedure: REVISON OF RIGHT ARM ARTERIOVENOUS FISTULA USING 8MMX10CM  GORETEX GRAFT;  Surgeon: Angelia Mould, MD;  Location: [place];  Service: Vascular;  Laterality: Right;     OB History   No obstetric history on file.      Home Medications    Prior to Admission medications   Medication Sig Start Date End Date Taking? Authorizing Provider  albuterol (PROVENTIL HFA;VENTOLIN HFA) 108 (90 Base) MCG/ACT inhaler TAKE 2 PUFFS BY MOUTH EVERY 6 HOURS AS NEEDED FOR WHEEZE OR SHORTNESS OF BREATH Patient taking differently: Inhale 2 puffs into the lungs every 6 (six) hours as needed for wheezing.  05/09/18   [name], MD  albuterol (PROVENTIL) (2.5 MG/3ML) 0.083% nebulizer solution Take 3 mLs (2.5 mg total) by nebulization every 6 (six) hours as needed for wheezing or shortness of breath. MUST MAKE APPT FOR FURTHER REFILLS 08/15/18   [name], MD  amLODipine (NORVASC) 10 MG tablet Take 1 tablet (10 mg total) by mouth daily. 05/16/18   Thurnell Lose, MD  AURYXIA 1 GM 210 MG(Fe) tablet TAKE 1 TABLET BY MOUTH THREE TIMES A DAY WITH MEALS AND TWICE A DAY WITH SNACKS.   SWALLOW WHOLE, DO NOT CHEW OR CRUSH MEDICATION 06/17/18   [provider]  calcium acetate (PHOSLO) 667 MG capsule Take 1,334 mg by mouth 3 (three) times daily with meals.     [provider]  cinacalcet (SENSIPAR) 30 MG tablet TAKE 1 TABLET BY MOUTH DAILY EVERY EVENING (DO NOT TAKE LESS THAN 12 HOURS PRIOR TO DIALYSIS) 06/08/18   [provider]  cloNIDine (CATAPRES) 0.1 MG tablet Take 1 tablet (0.1 mg total) by mouth 3 (three) times daily. 05/16/18   Thurnell Lose, MD  labetalol (NORMODYNE) 200 MG tablet Take 1 tablet (200 mg total) by mouth 2 (two) times daily. 05/16/18   Thurnell Lose, MD  lidocaine-prilocaine (EMLA) cream APPLY SMALL AMOUNT TO ACCESS SITE (AVF) 1 TO 2 HOURS BEFORE DIALYSIS. COVER WITH OCCLUSIVE DRESSING ([place]) 06/15/18   [provider]  nicotine (NICODERM CQ) 14 mg/24hr patch Place 1 patch (14 mg total) onto the  skin daily. START NICOTINE (NOICODERM) PATCH, ONE PATCH DAILY USING THE FOLLOWING DIRECTIONS:  APPLY ONE 21 MG PATCH DAILY FOR 6 WEEKS THEN, APPLY ONE 14 MG PATCH DAILY FOR 2-4 WEEKS THEN, APPLY ONE 7 MG PATCH DAILY FOR 2 WEEKS 07/07/18   Lorretta Harp, MD  nicotine (NICODERM CQ) 21 mg/24hr patch Place 1 patch (21 mg total) onto the skin daily. START NICOTINE (NOICODERM) PATCH, ONE PATCH DAILY USING THE FOLLOWING DIRECTIONS:  APPLY ONE 21 MG PATCH DAILY FOR 6 WEEKS THEN, APPLY ONE 14 MG PATCH DAILY FOR 2-4 WEEKS THEN, APPLY ONE 7 MG PATCH DAILY FOR 2 WEEKS 07/07/18   Lorretta Harp, MD  nicotine (NICODERM CQ) 7 mg/24hr patch Place 1 patch (7 mg total) onto the skin daily. START NICOTINE (NOICODERM) PATCH, ONE PATCH DAILY USING THE FOLLOWING DIRECTIONS:  APPLY ONE 21 MG PATCH DAILY FOR 6 WEEKS THEN, APPLY ONE 14 MG PATCH DAILY FOR 2-4 WEEKS THEN, APPLY ONE 7 MG PATCH DAILY FOR 2 WEEKS 07/07/18   Lorretta Harp, MD  oxyCODONE-acetaminophen [facility])  5-325 MG tablet Take 1 tablet by mouth every 6 (six) hours as needed for severe pain. Patient not taking: Reported on 08/11/2018 05/17/18   Gabriel Earing, PA-C  pantoprazole (PROTONIX) 20 MG tablet Take 1 tablet (20 mg total) by mouth 2 (two) times daily before a meal. 11/23/17   Argentina Donovan, PA-C    Family History Family History  Problem Relation Age of Onset  . Lupus Mother   . Diabetes Father   . Heart attack Father     Social History Social History   Tobacco Use  . Smoking status: Light Tobacco Smoker    Packs/day: 0.50    Years: 20.00    Pack years: 10.00    Types: Cigarettes  . Smokeless tobacco: Never Used  Substance Use Topics  . Alcohol use: Not Currently    Alcohol/week: 1.0 standard drinks    Types: 1 Glasses of wine per week  . Drug use: Yes    Types: Marijuana     Allergies   Cefazolin   Review of Systems Review of Systems  Unable to perform ROS: Mental status change     Physical Exam  Updated Vital Signs There were no vitals taken for this visit.  Physical Exam Vitals signs and nursing note reviewed.  Constitutional:      General: She is not in acute distress.    Appearance: She is well-developed. She is not diaphoretic.  HENT:     Head: Normocephalic and atraumatic.  Eyes:     Conjunctiva/sclera: Conjunctivae normal.  Neck:     Musculoskeletal: Neck supple.  Cardiovascular:     Rate and Rhythm: Regular rhythm. Tachycardia present.     Heart sounds: Murmur (IV/VI diastolic murmur loudest over LLSB) present.  Pulmonary:     Effort: Pulmonary effort is normal.     Breath sounds: Normal breath sounds.     Comments: Tachypneic Abdominal:     General: Abdomen is flat.     Palpations: Abdomen is soft.     Tenderness: There is no abdominal tenderness.  Genitourinary:    Rectum: Guaiac result positive.     Comments: No gross blood at the rectum, but stool is dark and tarry Musculoskeletal:     Right lower leg: No edema.     Left lower leg: No edema.     Comments: Left chest subclavian Vas-Cath in place clean dry and intact.  Left arm AV fistula appears to be clean dry and intact.  Palpable thrill.  Skin:    General: Skin is warm and dry.  Neurological:     Mental Status: She is alert.     Comments: Eyes open, not following commands.  Answers her name correctly. Not answering other questions.  Moves all 4 extremities.        ED Treatments / Results  Labs (all labs ordered are listed, but only abnormal results are displayed) Labs Reviewed  CULTURE, BLOOD (ROUTINE X 2)  CULTURE, BLOOD (ROUTINE X 2)  URINE CULTURE  SARS CORONAVIRUS 2 (HOSPITAL ORDER, [facility])  LACTIC ACID, PLASMA  LACTIC ACID, PLASMA  COMPREHENSIVE METABOLIC PANEL  CBC WITH DIFFERENTIAL/PLATELET  URINALYSIS, ROUTINE W REFLEX MICROSCOPIC  LIPASE, BLOOD  BRAIN NATRIURETIC PEPTIDE  PROCALCITONIN  BLOOD GAS, VENOUS  PROTIME-INR  AMMONIA    EKG None   Radiology No results found.  Procedures Procedures (including critical care time)  Medications Ordered in ED Medications  vancomycin (VANCOCIN) IVPB 1000 mg/200 mL premix (has no  administration in time range)  aztreonam (AZACTAM) 2 g in sodium chloride 0.9 % 100 mL IVPB (has no administration in time range)     Initial Impression / Assessment and Plan / ED Course  I have reviewed the triage vital signs and the nursing notes.  Pertinent labs & imaging results that were available during my care of the patient were reviewed by me and considered in my medical decision making (see chart for details).        58 year old woman with a past medical history of end-stage renal disease presents to the emergency department for evaluation of cough, fever, chills.  I was able to talk to the patient's husband Yehuda Savannah Bockrath who stated that the patient's symptoms had begun yesterday and had been worsening throughout the day.  She missed dialysis today because of feeling unwell.  EMS found the patient tachycardic, tachypneic and febrile.  650 mg of rectal acetaminophen given to the patient.  Found to have low hemoglobin of 5.4.  No obvious rectal bleeding.  No obvious source of bleeding.  Given a blood transfusion while in the emergency department.  Chest x-ray does not show signs of acute pneumonia but given that the patient had cough and shortness of breath at home it is most likely that this is a respiratory infection.  Treated with broad-spectrum antibiotics in the setting of end-stage renal disease.  Lactic acid normal.  No hypotension while in the ED.  COVID testing negative.  Patient to be admitted to the hospital for sepsis at this time.  Final Clinical Impressions(s) / ED Diagnoses   Final diagnoses:  Transient alteration of awareness  Sepsis without acute organ dysfunction, due to unspecified organism [facility])  Gastrointestinal hemorrhage, unspecified gastrointestinal hemorrhage type  ESRD  (end stage renal disease) [facility])    ED Discharge Orders    None       Andee Poles, MD 09/15/18 2351    Noemi Chapel, MD 09/18/18 1159

## 2018-09-15 NOTE — ED Provider Notes (Signed)
I saw and evaluated the patient, reviewed the resident's note and I agree with the findings and plan.  Pertinent History: The patient is an ill-appearing 58 year old female who presents with fever as high as 103.1, has a history of end-stage renal disease on dialysis, has been complaining of some coughing, some shortness of breath and is confused.  The patient is unable to give me much in the way of information other than telling me her name  Pertinent Exam findings: On exam the patient is tachycardic, she has a dialysis catheter in the left upper chest, she has no edema of her legs, she is very hot to the touch, she is tachypneic but has no abnormal lung sounds  I was personally present and directly supervised the following procedures:  Medical resuscitation, critical care  The patient was given IV fluids, awaiting lactic acid to determine amount of fluids needed, she is not hypotensive at this time.  Tylenol given for fever, broad-spectrum antibiotics, suspect vascular source.  The patient will need a high level of care.  She is critically ill.   EKG Interpretation  Date/Time:  Friday September 15 2018 21:11:34 EDT Ventricular Rate:  133 PR Interval:    QRS Duration: 83 QT Interval:  296 QTC Calculation: 441 R Axis:   22 Text Interpretation:  Sinus tachycardia Abnormal R-wave progression, early transition LVH with secondary repolarization abnormality Since last tracing rate faster Confirmed by Noemi Chapel [phone]) on 09/15/2018 10:36:34 PM Also confirmed by Noemi Chapel [phone]), editor Shon Hale [phone])  on 09/16/2018 11:48:33 AM        I personally interpreted the EKG as well as the resident and agree with the interpretation on the resident's chart.  Final diagnoses:  None    .Critical Care Performed by: Noemi Chapel, MD Authorized by: Noemi Chapel, MD   Critical care provider statement:    Critical care time (minutes):  35   Critical care time was exclusive of:   Teaching time   Critical care was time spent personally by me on the following activities:  Discussions with consultants, evaluation of patient's response to treatment, examination of patient, ordering and performing treatments and interventions, ordering and review of laboratory studies, ordering and review of radiographic studies, pulse oximetry, re-evaluation of patient's condition, obtaining history from patient or surrogate and review of old charts   Final diagnoses:  Transient alteration of awareness  Sepsis without acute organ dysfunction, due to unspecified organism [facility])  Gastrointestinal hemorrhage, unspecified gastrointestinal hemorrhage type  ESRD (end stage renal disease) ([place])      Noemi Chapel, MD 09/18/18 1158

## 2018-09-16 DIAGNOSIS — D649 Anemia, unspecified: Secondary | ICD-10-CM

## 2018-09-16 DIAGNOSIS — Z720 Tobacco use: Secondary | ICD-10-CM

## 2018-09-16 DIAGNOSIS — R6510 Systemic inflammatory response syndrome (SIRS) of non-infectious origin without acute organ dysfunction: Secondary | ICD-10-CM

## 2018-09-16 DIAGNOSIS — R6520 Severe sepsis without septic shock: Secondary | ICD-10-CM

## 2018-09-16 DIAGNOSIS — J9601 Acute respiratory failure with hypoxia: Secondary | ICD-10-CM

## 2018-09-16 DIAGNOSIS — R4182 Altered mental status, unspecified: Secondary | ICD-10-CM

## 2018-09-16 LAB — CBC
HCT: 14.6 % — ABNORMAL LOW (ref 36.0–46.0)
HCT: 17.3 % — ABNORMAL LOW (ref 36.0–46.0)
HCT: 22.3 % — ABNORMAL LOW (ref 36.0–46.0)
Hemoglobin: 4.8 g/dL — CL (ref 12.0–15.0)
Hemoglobin: 6.1 g/dL — CL (ref 12.0–15.0)
Hemoglobin: 7.5 g/dL — ABNORMAL LOW (ref 12.0–15.0)
MCH: 29.6 pg (ref 26.0–34.0)
MCH: 29.6 pg (ref 26.0–34.0)
MCH: 28.4 pg (ref 26.0–34.0)
MCHC: 32.9 g/dL (ref 30.0–36.0)
MCHC: 35.3 g/dL (ref 30.0–36.0)
MCHC: 33.6 g/dL (ref 30.0–36.0)
MCV: 90.1 fL (ref 80.0–100.0)
MCV: 84 fL (ref 80.0–100.0)
MCV: 84.5 fL (ref 80.0–100.0)
Platelets: 156 10*3/uL (ref 150–400)
Platelets: 150 10*3/uL (ref 150–400)
Platelets: 144 10*3/uL — ABNORMAL LOW (ref 150–400)
RBC: 1.62 MIL/uL — ABNORMAL LOW (ref 3.87–5.11)
RBC: 2.06 MIL/uL — ABNORMAL LOW (ref 3.87–5.11)
RBC: 2.64 MIL/uL — ABNORMAL LOW (ref 3.87–5.11)
RDW: 15.9 % — ABNORMAL HIGH (ref 11.5–15.5)
RDW: 16.1 % — ABNORMAL HIGH (ref 11.5–15.5)
RDW: 16.3 % — ABNORMAL HIGH (ref 11.5–15.5)
WBC: 11.4 10*3/uL — ABNORMAL HIGH (ref 4.0–10.5)
WBC: 12 10*3/uL — ABNORMAL HIGH (ref 4.0–10.5)
WBC: 8.3 10*3/uL (ref 4.0–10.5)
nRBC: 0.3 % — ABNORMAL HIGH (ref 0.0–0.2)
nRBC: 0.2 % (ref 0.0–0.2)
nRBC: 0.5 % — ABNORMAL HIGH (ref 0.0–0.2)

## 2018-09-16 LAB — POCT I-STAT EG7
Acid-base deficit: 1 mmol/L (ref 0.0–2.0)
Bicarbonate: 22 mmol/L (ref 20.0–28.0)
Calcium, Ion: 1.05 mmol/L — ABNORMAL LOW (ref 1.15–1.40)
HCT: 16 % — ABNORMAL LOW (ref 36.0–46.0)
Hemoglobin: 5.4 g/dL — CL (ref 12.0–15.0)
O2 Saturation: 83 %
Potassium: 4 mmol/L (ref 3.5–5.1)
Sodium: 133 mmol/L — ABNORMAL LOW (ref 135–145)
TCO2: 23 mmol/L (ref 22–32)
pCO2, Ven: 25.9 mmHg — ABNORMAL LOW (ref 44.0–60.0)
pH, Ven: 7.538 — ABNORMAL HIGH (ref 7.250–7.430)
pO2, Ven: 40 mmHg (ref 32.0–45.0)

## 2018-09-16 LAB — BLOOD CULTURE ID PANEL (REFLEXED)

## 2018-09-16 LAB — RESPIRATORY PANEL BY PCR

## 2018-09-16 LAB — SALICYLATE LEVEL: Salicylate Lvl: <7 mg/dL (ref 2.8–30.0)

## 2018-09-16 LAB — COMPREHENSIVE METABOLIC PANEL
ALT: 18 U/L (ref 0–44)
AST: 28 U/L (ref 15–41)
Albumin: 2.4 g/dL — ABNORMAL LOW (ref 3.5–5.0)
Alkaline Phosphatase: 57 U/L (ref 38–126)
Anion gap: 16 — ABNORMAL HIGH (ref 5–15)
BUN: 70 mg/dL — ABNORMAL HIGH (ref 6–20)
CO2: 21 mmol/L — ABNORMAL LOW (ref 22–32)
Calcium: 9 mg/dL (ref 8.9–10.3)
Chloride: 98 mmol/L (ref 98–111)
Creatinine, Ser: 9.84 mg/dL — ABNORMAL HIGH (ref 0.44–1.00)
GFR calc Af Amer: 5 mL/min — ABNORMAL LOW (ref >60)
GFR calc non Af Amer: 4 mL/min — ABNORMAL LOW (ref >60)
Glucose, Bld: 94 mg/dL (ref 70–99)
Potassium: 4 mmol/L (ref 3.5–5.1)
Sodium: 135 mmol/L (ref 135–145)
Total Bilirubin: 0.9 mg/dL (ref 0.3–1.2)
Total Protein: 5.3 g/dL — ABNORMAL LOW (ref 6.5–8.1)

## 2018-09-16 LAB — PHOSPHORUS: Phosphorus: 2.7 mg/dL (ref 2.5–4.6)

## 2018-09-16 LAB — PREPARE RBC (CROSSMATCH)

## 2018-09-16 LAB — MAGNESIUM: Magnesium: 1.8 mg/dL (ref 1.7–2.4)

## 2018-09-16 LAB — ACETAMINOPHEN LEVEL: Acetaminophen (Tylenol), Serum: <10 ug/mL — ABNORMAL LOW (ref 10–30)

## 2018-09-16 LAB — LACTIC ACID, PLASMA: Lactic Acid, Venous: 1.1 mmol/L (ref 0.5–1.9)

## 2018-09-16 MED ORDER — SODIUM CHLORIDE 0.9 % IV SOLN
INTRAVENOUS | Status: DC
  Administered 2018-09-17: 08:00:00 via INTRAVENOUS

## 2018-09-16 MED ORDER — ALBUTEROL SULFATE (2.5 MG/3ML) 0.083% IN NEBU: 2.5000 mg | INHALATION_SOLUTION | Freq: Four times a day (QID) | RESPIRATORY_TRACT | Status: DC | PRN

## 2018-09-16 MED ORDER — VANCOMYCIN HCL IN DEXTROSE 500-5 MG/100ML-% IV SOLN
500.0000 mg | INTRAVENOUS | Status: DC
  Administered 2018-09-16: 16:00:00 500 mg via INTRAVENOUS
  Filled 2018-09-16: qty 100

## 2018-09-16 MED ORDER — LABETALOL HCL 200 MG PO TABS
200.0000 mg | ORAL_TABLET | Freq: Two times a day (BID) | ORAL | Status: DC
  Administered 2018-09-16 (×2): 200 mg via ORAL
  Filled 2018-09-16 (×2): qty 1

## 2018-09-16 MED ORDER — ACETAMINOPHEN 325 MG PO TABS
650.0000 mg | ORAL_TABLET | Freq: Four times a day (QID) | ORAL | Status: DC | PRN
  Administered 2018-09-16 – 2018-09-17 (×3): 650 mg via ORAL
  Filled 2018-09-16 (×3): qty 2

## 2018-09-16 MED ORDER — PANTOPRAZOLE SODIUM 40 MG IV SOLR
40.0000 mg | Freq: Once | INTRAVENOUS | Status: AC
  Administered 2018-09-16: 40 mg via INTRAVENOUS
  Filled 2018-09-16: qty 40

## 2018-09-16 MED ORDER — VANCOMYCIN HCL IN DEXTROSE 500-5 MG/100ML-% IV SOLN
INTRAVENOUS | Status: AC
  Administered 2018-09-16: 500 mg via INTRAVENOUS
  Filled 2018-09-16: qty 100

## 2018-09-16 MED ORDER — CHLORHEXIDINE GLUCONATE CLOTH 2 % EX PADS
6.0000 | MEDICATED_PAD | Freq: Every day | CUTANEOUS | Status: DC
  Administered 2018-09-16 – 2018-09-19 (×3): 6 via TOPICAL

## 2018-09-16 MED ORDER — FERRIC CITRATE 1 GM 210 MG(FE) PO TABS
210.0000 mg | ORAL_TABLET | Freq: Three times a day (TID) | ORAL | Status: DC
  Administered 2018-09-16 – 2018-09-19 (×6): 210 mg via ORAL
  Filled 2018-09-16 (×6): qty 1

## 2018-09-16 MED ORDER — PANTOPRAZOLE SODIUM 40 MG PO TBEC: 40.0000 mg | DELAYED_RELEASE_TABLET | Freq: Two times a day (BID) | ORAL | Status: DC

## 2018-09-16 MED ORDER — CALCIUM ACETATE (PHOS BINDER) 667 MG PO CAPS
1334.0000 mg | ORAL_CAPSULE | Freq: Three times a day (TID) | ORAL | Status: DC
  Administered 2018-09-16 – 2018-09-19 (×6): 1334 mg via ORAL
  Filled 2018-09-16 (×6): qty 2

## 2018-09-16 MED ORDER — AMLODIPINE BESYLATE 10 MG PO TABS
10.0000 mg | ORAL_TABLET | Freq: Every day | ORAL | Status: DC
  Administered 2018-09-17 – 2018-09-19 (×3): 10 mg via ORAL
  Filled 2018-09-16 (×2): qty 1
  Filled 2018-09-16: qty 2

## 2018-09-16 MED ORDER — PANTOPRAZOLE SODIUM 40 MG IV SOLR
40.0000 mg | Freq: Two times a day (BID) | INTRAVENOUS | Status: DC
  Administered 2018-09-16 – 2018-09-18 (×4): 40 mg via INTRAVENOUS
  Filled 2018-09-16 (×3): qty 40

## 2018-09-16 MED ORDER — SODIUM CHLORIDE 0.9 % IV SOLN
2.0000 g | INTRAVENOUS | Status: DC
  Administered 2018-09-16: 2 g via INTRAVENOUS
  Filled 2018-09-16: qty 2

## 2018-09-16 MED ORDER — CINACALCET HCL 30 MG PO TABS
30.0000 mg | ORAL_TABLET | Freq: Every day | ORAL | Status: DC
  Administered 2018-09-16 – 2018-09-19 (×3): 30 mg via ORAL
  Filled 2018-09-16 (×3): qty 1

## 2018-09-16 MED ORDER — SODIUM CHLORIDE 0.9% IV SOLUTION
Freq: Once | INTRAVENOUS | Status: AC
  Administered 2018-09-16: 06:00:00 via INTRAVENOUS

## 2018-09-16 MED ORDER — SODIUM CHLORIDE 0.9% IV SOLUTION: Freq: Once | INTRAVENOUS | Status: DC

## 2018-09-16 MED ORDER — LIDOCAINE-PRILOCAINE 2.5-2.5 % EX CREA: TOPICAL_CREAM | Freq: Once | CUTANEOUS | Status: DC

## 2018-09-16 NOTE — Progress Notes (Signed)
Was told by hemodialysis that pt received cefepime/ vancomycin and blood in dialysis.

## 2018-09-16 NOTE — Progress Notes (Signed)
PHARMACY - PHYSICIAN COMMUNICATION CRITICAL VALUE ALERT - BLOOD CULTURE IDENTIFICATION (BCID)  Karen Morrow is an 58 y.o. female who presented to [facility] on 09/15/2018 with a chief complaint of fever and AMS.  Assessment:  Dialysis patient with possible bacteremia. BCID revealed Staph aureus in 1 of 4 bottles, which is unusual.  Fever improving but WBC is trending up.  CXR negative.  Name of physician (or Provider) Contacted: Dr. Algis Liming  Current antibiotics: vancomycin and cefepime  Changes to prescribed antibiotics recommended:  Continue vanc given acute presentation per [facility] Continue cefepime and narrow further as appropriate   Results for orders placed or performed during the hospital encounter of 09/15/18  Blood Culture ID Panel (Reflexed) (Collected: 09/15/2018  9:38 PM)  Result Value Ref Range   Enterococcus species NOT DETECTED NOT DETECTED   Listeria monocytogenes NOT DETECTED NOT DETECTED   Staphylococcus species DETECTED (A) NOT DETECTED   Staphylococcus aureus (BCID) DETECTED (A) NOT DETECTED   Methicillin resistance NOT DETECTED NOT DETECTED   Streptococcus species NOT DETECTED NOT DETECTED   Streptococcus agalactiae NOT DETECTED NOT DETECTED   Streptococcus pneumoniae NOT DETECTED NOT DETECTED   Streptococcus pyogenes NOT DETECTED NOT DETECTED   Acinetobacter baumannii NOT DETECTED NOT DETECTED   Enterobacteriaceae species NOT DETECTED NOT DETECTED   Enterobacter cloacae complex NOT DETECTED NOT DETECTED   Escherichia coli NOT DETECTED NOT DETECTED   Klebsiella oxytoca NOT DETECTED NOT DETECTED   Klebsiella pneumoniae NOT DETECTED NOT DETECTED   Proteus species NOT DETECTED NOT DETECTED   Serratia marcescens NOT DETECTED NOT DETECTED   Haemophilus influenzae NOT DETECTED NOT DETECTED   Neisseria meningitidis NOT DETECTED NOT DETECTED   Pseudomonas aeruginosa NOT DETECTED NOT DETECTED   Candida albicans NOT DETECTED NOT DETECTED   Candida glabrata NOT  DETECTED NOT DETECTED   Candida krusei NOT DETECTED NOT DETECTED   Candida parapsilosis NOT DETECTED NOT DETECTED   Candida tropicalis NOT DETECTED NOT DETECTED    Karen Morrow, PharmD, BCPS, [place] 09/16/2018, 12:49 PM

## 2018-09-16 NOTE — ED Notes (Signed)
ED TO INPATIENT HANDOFF REPORT  ED Nurse Name and Phone #:  Lenice Pressman [phone]  S Name/Age/Gender Karen Morrow 58 y.o. female Room/Bed: 021C/021C  Code Status   Code Status: Full Code  Home/SNF/Other Home Patient oriented to: self, place, time and situation Is this baseline? Yes   Triage Complete: Triage complete  Chief Complaint [facility], Dialysis pt  Triage Note  Patient BIB EMS for AMS and fever.  Family states that patient missed dialysis appt yesterday and started having fever, SOB, cough, and chills this evening.  Patient has AMS and can barely stay awake to answer questions.  Temp 103.2 with HR 140s and 38-44 RR.  Patient does respond to painful stimulus and will answer some questions.   MD at bedside.   Allergies Allergies  Allergen Reactions  . Cefazolin Other (See Comments)    Severe thrombocytopenia (has tolerated zosyn in the past) ID aware    Level of Care/Admitting Diagnosis ED Disposition    ED Disposition Condition [place]: [place] [[id number]]  Level of Care: Progressive [102]  Covid Evaluation: N/A  Diagnosis: Acute GI bleeding [[id number]]  Admitting Physician: Bernadette Hoit [[id number]]  Attending Physician: Bernadette Hoit [[id number]]  Estimated length of stay: 3 - 4 days  Certification:: I certify this patient will need inpatient services for at least 2 midnights  PT Class (Do Not Modify): Inpatient [101]  PT Acc Code (Do Not Modify): Private [1]       B Medical/Surgery History Past Medical History:  Diagnosis Date  . Anginal pain ([place])   . Asthma   . CHF (congestive heart failure) ([place])   . Chronic kidney disease    dialysis 3x wk  . Dyspnea   . Frequent bowel movements   . GERD (gastroesophageal reflux disease)   . Hepatitis C antibody test positive   . Hypertension    "just dx'd today" (11/03/2012)   Past Surgical History:  Procedure Laterality Date  . ABDOMINAL HYSTERECTOMY     "partial"  (11/03/2012)  . AV FISTULA PLACEMENT Right 12/03/2013   Procedure: CREATION OF ARTERIOVENOUS (AV) FISTULA RIGHT ARM ;  Surgeon: Rosetta Posner, MD;  Location: [facility];  Service: Vascular;  Laterality: Right;  . AV FISTULA PLACEMENT Left 05/17/2018   Procedure: CREATION OF BRACHIOCEPHALIC FISTULA LEFT ARM;  Surgeon: Waynetta Sandy, MD;  Location: [facility];  Service: Vascular;  Laterality: Left;  . [place] REMOVAL Right 03/16/2018   Procedure: REMOVAL OF ARTERIOVENOUS GORETEX GRAFT ([place]) RIGHT ARM;  Surgeon: Waynetta Sandy, MD;  Location: [facility];  Service: Vascular;  Laterality: Right;  . BASCILIC VEIN TRANSPOSITION Right 03/06/2014   Procedure: SECOND STAGE BASILIC VEIN TRANSPOSITION;  Surgeon: Rosetta Posner, MD;  [facility];  Service: Vascular;  Laterality: Right;  . ESOPHAGOGASTRODUODENOSCOPY N/A 06/05/2013   Procedure: ESOPHAGOGASTRODUODENOSCOPY (EGD);  Surgeon: Winfield Cunas., MD;  Location: [facility];  Service: Endoscopy;  Laterality: N/A;  . ESOPHAGOGASTRODUODENOSCOPY N/A 09/08/2016   Procedure: ESOPHAGOGASTRODUODENOSCOPY (EGD);  Surgeon: Milus Banister, MD;  Location: [facility];  Service: Endoscopy;  Laterality: N/A;  . INSERTION OF DIALYSIS CATHETER Left 03/16/2018   Procedure: PLACEMENT OF TUNNEL DIALYSIS CATHETER;  Surgeon: Waynetta Sandy, MD;  Location: [facility];  Service: Vascular;  Laterality: Left;  . REVISON OF ARTERIOVENOUS FISTULA Right 01/28/2017   Procedure: REVISON OF RIGHT ARM ARTERIOVENOUS FISTULA USING 8MMX10CM GORETEX GRAFT;  Surgeon: Angelia Mould, MD;  Location: [facility];  Service:  Vascular;  Laterality: Right;     A IV Location/Drains/Wounds Patient Lines/Drains/Airways Status   Active Line/Drains/Airways    Name:   Placement date:   Placement time:   Site:   Days:   Peripheral IV 09/15/18 Right Hand   09/15/18    2120    Hand   1   Peripheral IV 09/15/18 Right Forearm   09/15/18    2347    Forearm   1   Fistula / Graft   -    -     -      Fistula / Graft Left Upper arm Arteriovenous fistula   05/17/18    0902    Upper arm   122   Hemodialysis Catheter Left Internal jugular Double-lumen   03/16/18    1524    Internal jugular   184   Incision (Closed) 03/16/18 Chest Left   03/16/18    1553     184   Incision (Closed) 03/16/18 Arm Right   03/16/18    1600     184   Incision (Closed) 05/17/18 Arm Left   05/17/18    0901     122          Intake/Output Last 24 hours No intake or output data in the 24 hours ending 09/16/18 0015  Labs/Imaging Results for orders placed or performed during the hospital encounter of 09/15/18 (from the past 48 hour(s))  SARS Coronavirus 2 [facility] order, Performed in [facility] hospital lab)     Status: None   Collection Time: 09/15/18  9:12 PM  Result Value Ref Range   SARS Coronavirus 2 NEGATIVE NEGATIVE    Comment: (NOTE) If result is NEGATIVE SARS-CoV-2 target nucleic acids are NOT DETECTED. The SARS-CoV-2 RNA is generally detectable in upper and lower  respiratory specimens during the acute phase of infection. The lowest  concentration of SARS-CoV-2 viral copies this assay can detect is 250  copies / mL. A negative result does not preclude SARS-CoV-2 infection  and should not be used as the sole basis for treatment or other  patient management decisions.  A negative result may occur with  improper specimen collection / handling, submission of specimen other  than nasopharyngeal swab, presence of viral mutation(s) within the  areas targeted by this assay, and inadequate number of viral copies  (<250 copies / mL). A negative result must be combined with clinical  observations, patient history, and epidemiological information. If result is POSITIVE SARS-CoV-2 target nucleic acids are DETECTED. The SARS-CoV-2 RNA is generally detectable in upper and lower  respiratory specimens dur ing the acute phase of infection.  Positive  results are indicative of active infection with  SARS-CoV-2.  Clinical  correlation with patient history and other diagnostic information is  necessary to determine patient infection status.  Positive results do  not rule out bacterial infection or co-infection with other viruses. If result is PRESUMPTIVE POSTIVE SARS-CoV-2 nucleic acids MAY BE PRESENT.   A presumptive positive result was obtained on the submitted specimen  and confirmed on repeat testing.  While 2019 novel coronavirus  (SARS-CoV-2) nucleic acids may be present in the submitted sample  additional confirmatory testing may be necessary for epidemiological  and / or clinical management purposes  to differentiate between  SARS-CoV-2 and other Sarbecovirus currently known to infect humans.  If clinically indicated additional testing with an alternate test  methodology [phone]) is advised. The SARS-CoV-2 RNA is generally  detectable in upper and lower  respiratory sp ecimens during the acute  phase of infection. The expected result is Negative. Fact Sheet for Patients:  StrictlyIdeas.no Fact Sheet for Healthcare Providers: BankingDealers.co.za This test is not yet approved or cleared by the [place] FDA and has been authorized for detection and/or diagnosis of SARS-CoV-2 by FDA under an Emergency Use Authorization (EUA).  This EUA will remain in effect (meaning this test can be used) for the duration of the COVID-19 declaration under Section 564(b)(1) of the Act, 21 U.S.C. section 360bbb-3(b)(1), unless the authorization is terminated or revoked sooner. Performed at [facility], [place] [address], [place], [place] [address]   Brain natriuretic peptide     Status: Abnormal   Collection Time: 09/15/18  9:26 PM  Result Value Ref Range   B Natriuretic Peptide 3,309.9 (H) 0.0 - 100.0 pg/mL    Comment: Performed at [facility] [address], [place], [place] [address]  Lactic acid, plasma     Status: None    Collection Time: 09/15/18  9:53 PM  Result Value Ref Range   Lactic Acid, Venous 1.9 0.5 - 1.9 mmol/L    Comment: Performed at [facility] [address], [place], [place] [address]  Comprehensive metabolic panel     Status: Abnormal   Collection Time: 09/15/18  9:53 PM  Result Value Ref Range   Sodium 135 135 - 145 mmol/L   Potassium 4.1 3.5 - 5.1 mmol/L   Chloride 99 98 - 111 mmol/L   CO2 20 (L) 22 - 32 mmol/L   Glucose, Bld 87 70 - 99 mg/dL   BUN 65 (H) 6 - 20 mg/dL   Creatinine, Ser 9.73 (H) 0.44 - 1.00 mg/dL   Calcium 9.1 8.9 - 10.3 mg/dL   Total Protein 5.5 (L) 6.5 - 8.1 g/dL   Albumin 2.6 (L) 3.5 - 5.0 g/dL   AST 29 15 - 41 U/L   ALT 20 0 - 44 U/L   Alkaline Phosphatase 64 38 - 126 U/L   Total Bilirubin 0.8 0.3 - 1.2 mg/dL   GFR calc non Af Amer 4 (L) >60 mL/min   GFR calc Af Amer 5 (L) >60 mL/min   Anion gap 16 (H) 5 - 15    Comment: Performed at [facility], [place] [address], [place], [place] [address]  CBC WITH DIFFERENTIAL     Status: Abnormal   Collection Time: 09/15/18  9:53 PM  Result Value Ref Range   WBC 8.9 4.0 - 10.5 K/uL   RBC 1.80 (L) 3.87 - 5.11 MIL/uL   Hemoglobin 5.3 (LL) 12.0 - 15.0 g/dL    Comment: REPEATED TO VERIFY THIS CRITICAL RESULT HAS VERIFIED AND BEEN CALLED TO K.CARTER RN BY KATHERINE MCCORMICK ON 04 17 2020 AT 2226, AND HAS BEEN READ BACK.     HCT 16.8 (L) 36.0 - 46.0 %   MCV 93.3 80.0 - 100.0 fL   MCH 29.4 26.0 - 34.0 pg   MCHC 31.5 30.0 - 36.0 g/dL   RDW 16.2 (H) 11.5 - 15.5 %   Platelets 168 150 - 400 K/uL   nRBC 0.0 0.0 - 0.2 %   Neutrophils Relative % 88 %   Neutro Abs 7.9 (H) 1.7 - 7.7 K/uL   Lymphocytes Relative 7 %   Lymphs Abs 0.6 (L) 0.7 - 4.0 K/uL   Monocytes Relative 4 %   Monocytes Absolute 0.3 0.1 - 1.0 K/uL   Eosinophils Relative 0 %  Eosinophils Absolute 0.0 0.0 - 0.5 K/uL   Basophils Relative 0 %   Basophils Absolute 0.0 0.0 - 0.1 K/uL   Immature Granulocytes 1 %   Abs Immature Granulocytes 0.05 0.00  - 0.07 K/uL    Comment: Performed at [facility] [address], [place], [place] [address]  Lipase, blood     Status: None   Collection Time: 09/15/18  9:53 PM  Result Value Ref Range   Lipase 34 11 - 51 U/L    Comment: Performed at [facility] [address] [place], [place] [address]  Procalcitonin     Status: None   Collection Time: 09/15/18  9:53 PM  Result Value Ref Range   Procalcitonin 0.85 ng/mL    Comment:        Interpretation: PCT > 0.5 ng/mL and <= 2 ng/mL: Systemic infection (sepsis) is possible, but other conditions are known to elevate PCT as well. (NOTE)       Sepsis PCT Algorithm           Lower Respiratory Tract                                      Infection PCT Algorithm    ----------------------------     ----------------------------         PCT < 0.25 ng/mL                PCT < 0.10 ng/mL         Strongly encourage             Strongly discourage   discontinuation of antibiotics    initiation of antibiotics    ----------------------------     -----------------------------       PCT 0.25 - 0.50 ng/mL            PCT 0.10 - 0.25 ng/mL               OR       >80% decrease in PCT            Discourage initiation of                                            antibiotics      Encourage discontinuation           of antibiotics    ----------------------------     -----------------------------         PCT >= 0.50 ng/mL              PCT 0.26 - 0.50 ng/mL                AND       <80% decrease in PCT             Encourage initiation of                                             antibiotics       Encourage continuation           of antibiotics    ----------------------------     -----------------------------  PCT >= 0.50 ng/mL                  PCT > 0.50 ng/mL               AND         increase in PCT                  Strongly encourage                                      initiation of antibiotics    Strongly encourage escalation            of antibiotics                                     -----------------------------                                           PCT <= 0.25 ng/mL                                                 OR                                        > 80% decrease in PCT                                     Discontinue / Do not initiate                                             antibiotics Performed at [facility], [address] [place], [place] [address]   Protime-INR     Status: None   Collection Time: 09/15/18  9:53 PM  Result Value Ref Range   Prothrombin Time 13.5 11.4 - 15.2 seconds   INR 1.0 0.8 - 1.2    Comment: (NOTE) INR goal varies based on device and disease states. Performed at [facility], [place] [address], [place], [place] [address]   Ammonia     Status: None   Collection Time: 09/15/18  9:53 PM  Result Value Ref Range   Ammonia 19 9 - 35 umol/L    Comment: Performed at [facility], [place] [address], [place],  [address]  POCT I-Stat EG7     Status: Abnormal   Collection Time: 09/15/18 10:04 PM  Result Value Ref Range   pH, Ven 7.538 (H) 7.250 - 7.430   pCO2, Ven 25.9 (L) 44.0 - 60.0 mmHg   pO2, Ven 40.0 32.0 - 45.0 mmHg   Bicarbonate 22.0 20.0 - 28.0 mmol/L   TCO2 23 22 - 32 mmol/L   O2 Saturation 83.0 %   Acid-base deficit 1.0 0.0 - 2.0 mmol/L   Sodium  133 (L) 135 - 145 mmol/L   Potassium 4.0 3.5 - 5.1 mmol/L   Calcium, Ion 1.05 (L) 1.15 - 1.40 mmol/L   HCT 16.0 (L) 36.0 - 46.0 %   Hemoglobin 5.4 (LL) 12.0 - 15.0 g/dL   Patient temperature HIDE    Sample type VENOUS    Comment NOTIFIED PHYSICIAN   Type and screen [place]     Status: None (Preliminary result)   Collection Time: 09/15/18 11:35 PM  Result Value Ref Range   ABO/RH(D) PENDING    Antibody Screen PENDING    Sample Expiration      09/18/2018 Performed at [facility] [address] [place], [place] [address]   POC occult blood, ED     Status: Abnormal    Collection Time: 09/15/18 11:40 PM  Result Value Ref Range   Fecal Occult Bld POSITIVE (A) NEGATIVE   Dg Chest Port 1 View  Result Date: 09/15/2018 CLINICAL DATA:  Shortness of breath fever EXAM: PORTABLE CHEST 1 VIEW COMPARISON:  05/13/2018 FINDINGS: Cardiac shadow is mildly enlarged. Dialysis catheter is again seen. The lungs are well aerated without focal infiltrate or sizable effusion. No bony abnormality is noted. IMPRESSION: No acute abnormality noted. Electronically Signed   By: Inez Catalina M.D.   On: 09/15/2018 21:54    Pending Labs Unresulted Labs (From admission, onward)    Start     Ordered   09/16/18 0500  Comprehensive metabolic panel  Tomorrow morning,   R     09/15/18 2358   09/16/18 0500  CBC  Tomorrow morning,   R     09/15/18 2358   09/15/18 2359  Phosphorus  Once,   R     09/15/18 2358   09/15/18 2358  Magnesium  Once,   R     09/15/18 2358   09/15/18 2349  Respiratory Panel by PCR  (Respiratory virus panel with precautions)  Once,   R     09/15/18 2349   09/15/18 2324  Culture, blood (single)  ONCE - STAT,   STAT     09/15/18 2324   20/25/42 7062  Salicylate level  ONCE - STAT,   STAT     09/15/18 2306   09/15/18 2307  Acetaminophen level  ONCE - STAT,   STAT     09/15/18 2306   09/15/18 2238  Prepare RBC  (Adult Blood Administration - Red Blood Cells)  Once,   R    Question Answer Comment  # of Units 1 unit   Transfusion Indications Actively Bleeding / GI Bleed   Number of Units to Keep Ahead 2 units ahead   If emergent release call blood bank Not emergent release   Instructions: Transfuse      09/15/18 2238   09/15/18 2110  Urine culture  ONCE - STAT,   STAT     09/15/18 2111   09/15/18 2109  Lactic acid, plasma  Now then every 2 hours,   STAT     09/15/18 2111   09/15/18 2109  Blood Culture (routine x 2)  BLOOD CULTURE X 2,   STAT     09/15/18 2111   09/15/18 2109  Urinalysis, Routine w reflex microscopic  ONCE - STAT,   STAT     09/15/18 2111    09/15/18 2109  Blood gas, venous ([facility], [facility], [facility])  ONCE - STAT,   STAT     09/15/18 2111  Vitals/Pain Today's Vitals   09/15/18 2300 09/15/18 2330 09/15/18 2344 09/15/18 2345  BP: (!) 143/84     Pulse: (!) 119 (!) 110  (!) 112  Resp: (!) 35 (!) 36  (!) 36  Temp:   (!) 102.2 F (39 C)   TempSrc:   Rectal   SpO2: 100% 100%  100%  Weight:      Height:      PainSc:        Isolation Precautions Droplet precaution  Medications Medications  vancomycin (VANCOCIN) 1,250 mg in sodium chloride 0.9 % 250 mL IVPB (1,250 mg Intravenous New Bag/Given 09/15/18 2329)  0.9 %  sodium chloride infusion (Manually program via Guardrails IV Fluids) (has no administration in time range)  ceFEPIme (MAXIPIME) 2 g in sodium chloride 0.9 % 100 mL IVPB (0 g Intravenous Stopped 09/15/18 2251)  acetaminophen (TYLENOL) suppository 650 mg (650 mg Rectal Given 09/15/18 2222)    Mobility walks Moderate fall risk   Focused Assessments Renal Assessment Handoff:  Hemodialysis Schedule: Hemodialysis Schedule: Tuesday/Thursday/Saturday Last Hemodialysis date and time:    Restricted appendage: left arm     R Recommendations: See Admitting Provider Note  Report given to:   Additional Notes:

## 2018-09-16 NOTE — Progress Notes (Addendum)
PROGRESS NOTE   Karen Morrow  KGU:[id number]    DOB: 03/20/1961    DOA: 09/15/2018  PCP: [name], MD   I have briefly reviewed patients previous medical records in [facility].  Brief Narrative:  58 year old female with PMH of ESRD on TTS HD, HTN, GERD, gastric ulcer, hepatitis C, asthma, chronic CHF, presented to [facility] ED via EMS on 09/15/2018 due to high fever, nonproductive cough, dyspnea and AMS which reportedly started on day of arrival.  Patient is a poor historian.  Met sepsis criteria in the ED without clear source.  COVID testing negative.  Empirically started on IV vancomycin and cefepime.  Sepsis improved.  Hemoglobin down to 4.8 which was in 9 g range in early April per nephrology along with unclear history of melena and blood in stool.  Transfusing for anemia.  GI consulted and plan EGD 4/19.  Nephrology consulted for dialysis needs.   Assessment & Plan:   Principal Problem:   Sepsis ([place]) Active Problems:   Gastric ulcer   HTN (hypertension)   ESRD (end stage renal disease) (HCC)   Gastroesophageal reflux disease   Anemia in chronic kidney disease, on chronic dialysis (HCC)   Acute GI bleeding   Symptomatic anemia   Severe systemic inflammatory response syndrome (SIRS) (HCC)   Altered mental status   Tobacco abuse   1. MSSA bacteremia and sepsis: Likely due to [facility].  Met sepsis criteria on admission.  Mild leukocytosis.  Lactate normal.  Procalcitonin 0.85.  RVP and SARS coronavirus 2 testing negative.  Blood cultures x2: Show gram-positive cocci in clusters in both aerobic and anaerobic bottles.  BCID shows MSSA positive.  Chest x-ray without acute findings.  Patient has left TDC in place and an aVF which is ready to use.  She could have bacteremia related to [facility].  Empirically started on IV vancomycin and cefepime, continue pending further blood culture results.  Nephrology plans to remove [place] after HD today.  Sepsis improved.  Will probably stop vancomycin  in a.m. pending further culture results.  ID to automatically consult. 2. Symptomatic anemia: Hemoglobin in the 9 range in early April per nephrology.  Down to 5.4 on admission >4.8 on 4/18.  After 1 unit PRBC, up to 6.1.  Transfuse second unit PRBC at HD today.  FOBT +.  Gives unclear history of melena and rectal bleed, could be due to GI bleeding from known chronic gastric ulcer.  GI consulted and plan EGD 4/19. 3. Suspected subacute upper GI bleed/gastric ulcer/GERD: Reviewed prior records and has had EGD last one in 2018 which showed gastric ulcer.  Continue n.p.o., IV PPI, GI input appreciated and plan EGD in a.m. 4. ESRD on TTS HD: Nephrology consulted.  Missed last HD.  For HD today as per TTS schedule. 5. Essential hypertension: Mildly uncontrolled.  Continue amlodipine and labetalol. 6. Acute encephalopathy: Baseline mental status not known.  Presenting symptoms may have been related to sepsis and anemia.  Mental status may be back to baseline.  Monitor closely.  No focal deficits. 7. Tobacco abuse: Cessation counseled. 8. Hepatitis C: Outpatient follow-up. 9. Metabolic bone disease: Management per nephrology.  DVT prophylaxis: SCDs Code Status: Full Family Communication: Discussed in detail with patient spouse, updated care and answer questions. Disposition: DC home pending clinical improvement   Consultants:  Nephrology GI/Dr. Benson Norway  Procedures:  None  Antimicrobials:  IV cefepime and vancomycin   Subjective: Seen this morning prior to HD.  Had completed 1  unit of blood transfusion.  States that she feels much better.  Denies fever, cough, chest pain, dyspnea.  Poor historian.  States that midweek last week she had an episode of dark stools and small amount of red blood.  Cannot remember that she had an EGD.  ROS: As above, otherwise negative.  Objective:  Vitals:   09/16/18 1330 09/16/18 1400 09/16/18 1430 09/16/18 1500  BP: 138/75 (!) 141/80 (!) 155/82 140/81  Pulse:  100 (!) 102 (!) 105 (!) 107  Resp:      Temp:      TempSrc:      SpO2:      Weight:      Height:        Examination:  General exam: Middle-aged female, moderately built and nourished lying comfortably propped up in bed.  Pale mucosa. Respiratory system: Clear to auscultation. Respiratory effort normal.  Left anterior chest tunneled HD catheter without acute findings Cardiovascular system: S1 & S2 heard, RRR. No JVD, murmurs, rubs, gallops or clicks. No pedal edema.  Telemetry personally reviewed: Sinus rhythm. Gastrointestinal system: Abdomen is nondistended, soft and nontender. No organomegaly or masses felt. Normal bowel sounds heard. Central nervous system: Alert and oriented. No focal neurological deficits. Extremities: Symmetric 5 x 5 power. Skin: No rashes, lesions or ulcers Psychiatry: Judgement and insight impaired. Mood & affect appropriate.     Data Reviewed: I have personally reviewed following labs and imaging studies  CBC: Recent Labs  Lab 09/15/18 2153 09/15/18 2204 09/16/18 0252 09/16/18 0906  WBC 8.9  --  11.4* 12.0*  NEUTROABS 7.9*  --   --   --   HGB 5.3* 5.4* 4.8* 6.1*  HCT 16.8* 16.0* 14.6* 17.3*  MCV 93.3  --  90.1 84.0  PLT 168  --  156 902   Basic Metabolic Panel: Recent Labs  Lab 09/15/18 2153 09/15/18 2204 09/16/18 0252  NA 135 133* 135  K 4.1 4.0 4.0  CL 99  --  98  CO2 20*  --  21*  GLUCOSE 87  --  94  BUN 65*  --  70*  CREATININE 9.73*  --  9.84*  CALCIUM 9.1  --  9.0  MG 1.8  --   --   PHOS 2.7  --   --    Liver Function Tests: Recent Labs  Lab 09/15/18 2153 09/16/18 0252  AST 29 28  ALT 20 18  ALKPHOS 64 57  BILITOT 0.8 0.9  PROT 5.5* 5.3*  ALBUMIN 2.6* 2.4*   Coagulation Profile: Recent Labs  Lab 09/15/18 2153  INR 1.0     Recent Results (from the past 240 hour(s))  SARS Coronavirus 2 [facility] order, Performed in [facility] lab)     Status: None   Collection Time: 09/15/18  9:12 PM  Result Value  Ref Range Status   SARS Coronavirus 2 NEGATIVE NEGATIVE Final    Comment: (NOTE) If result is NEGATIVE SARS-CoV-2 target nucleic acids are NOT DETECTED. The SARS-CoV-2 RNA is generally detectable in upper and lower  respiratory specimens during the acute phase of infection. The lowest  concentration of SARS-CoV-2 viral copies this assay can detect is 250  copies / mL. A negative result does not preclude SARS-CoV-2 infection  and should not be used as the sole basis for treatment or other  patient management decisions.  A negative result may occur with  improper specimen collection / handling, submission of specimen other  than nasopharyngeal swab, presence of viral  mutation(s) within the  areas targeted by this assay, and inadequate number of viral copies  (<250 copies / mL). A negative result must be combined with clinical  observations, patient history, and epidemiological information. If result is POSITIVE SARS-CoV-2 target nucleic acids are DETECTED. The SARS-CoV-2 RNA is generally detectable in upper and lower  respiratory specimens dur ing the acute phase of infection.  Positive  results are indicative of active infection with SARS-CoV-2.  Clinical  correlation with patient history and other diagnostic information is  necessary to determine patient infection status.  Positive results do  not rule out bacterial infection or co-infection with other viruses. If result is PRESUMPTIVE POSTIVE SARS-CoV-2 nucleic acids MAY BE PRESENT.   A presumptive positive result was obtained on the submitted specimen  and confirmed on repeat testing.  While 2019 novel coronavirus  (SARS-CoV-2) nucleic acids may be present in the submitted sample  additional confirmatory testing may be necessary for epidemiological  and / or clinical management purposes  to differentiate between  SARS-CoV-2 and other Sarbecovirus currently known to infect humans.  If clinically indicated additional testing with an  alternate test  methodology [phone]) is advised. The SARS-CoV-2 RNA is generally  detectable in upper and lower respiratory sp ecimens during the acute  phase of infection. The expected result is Negative. Fact Sheet for Patients:  StrictlyIdeas.no Fact Sheet for Healthcare Providers: BankingDealers.co.za This test is not yet approved or cleared by the [place] FDA and has been authorized for detection and/or diagnosis of SARS-CoV-2 by FDA under an Emergency Use Authorization (EUA).  This EUA will remain in effect (meaning this test can be used) for the duration of the COVID-19 declaration under Section 564(b)(1) of the Act, 21 U.S.C. section 360bbb-3(b)(1), unless the authorization is terminated or revoked sooner. Performed at [facility], [place] [address], [place], [place] [address]   Respiratory Panel by PCR     Status: None   Collection Time: 09/15/18  9:12 PM  Result Value Ref Range Status   Adenovirus NOT DETECTED NOT DETECTED Final   Coronavirus 229E NOT DETECTED NOT DETECTED Final    Comment: (NOTE) The Coronavirus on the Respiratory Panel, DOES NOT test for the novel  Coronavirus (2019 nCoV)    Coronavirus HKU1 NOT DETECTED NOT DETECTED Final   Coronavirus NL63 NOT DETECTED NOT DETECTED Final   Coronavirus OC43 NOT DETECTED NOT DETECTED Final   Metapneumovirus NOT DETECTED NOT DETECTED Final   Rhinovirus / Enterovirus NOT DETECTED NOT DETECTED Final   Influenza A NOT DETECTED NOT DETECTED Final   Influenza B NOT DETECTED NOT DETECTED Final   Parainfluenza Virus 1 NOT DETECTED NOT DETECTED Final   Parainfluenza Virus 2 NOT DETECTED NOT DETECTED Final   Parainfluenza Virus 3 NOT DETECTED NOT DETECTED Final   Parainfluenza Virus 4 NOT DETECTED NOT DETECTED Final   Respiratory Syncytial Virus NOT DETECTED NOT DETECTED Final   Bordetella pertussis NOT DETECTED NOT DETECTED Final   Chlamydophila pneumoniae NOT  DETECTED NOT DETECTED Final   Mycoplasma pneumoniae NOT DETECTED NOT DETECTED Final    Comment: Performed at [facility], [place]. [address], [place], [place] [address]  Blood Culture (routine x 2)     Status: None (Preliminary result)   Collection Time: 09/15/18  9:30 PM  Result Value Ref Range Status   Specimen Description BLOOD RIGHT HAND  Final   Special Requests   Final    BOTTLES DRAWN AEROBIC AND ANAEROBIC Blood Culture adequate volume  Culture  Setup Time   Final    GRAM POSITIVE COCCI IN CLUSTERS IN BOTH AEROBIC AND ANAEROBIC BOTTLES CRITICAL VALUE NOTED.  VALUE IS CONSISTENT WITH PREVIOUSLY REPORTED AND CALLED VALUE.    Culture   Final    NO GROWTH < 12 HOURS Performed at [facility], [place] [address], [place], [place] [address]    Report Status PENDING  Incomplete  Blood Culture (routine x 2)     Status: None (Preliminary result)   Collection Time: 09/15/18  9:38 PM  Result Value Ref Range Status   Specimen Description BLOOD LEFT LOWER ARM  Final   Special Requests   Final    BOTTLES DRAWN AEROBIC AND ANAEROBIC Blood Culture adequate volume   Culture  Setup Time   Final    GRAM POSITIVE COCCI IN CLUSTERS IN BOTH AEROBIC AND ANAEROBIC BOTTLES CRITICAL RESULT CALLED TO, READ BACK BY AND VERIFIED WITH: T. DANG, PHARMD AT 1220 ON 09/16/18 BY C. JESSUP, MLT. Performed at [facility], [place] [address] [place], [place] [address]    Culture GRAM POSITIVE COCCI  Final   Report Status PENDING  Incomplete  Blood Culture ID Panel (Reflexed)     Status: Abnormal   Collection Time: 09/15/18  9:38 PM  Result Value Ref Range Status   Enterococcus species NOT DETECTED NOT DETECTED Final   Listeria monocytogenes NOT DETECTED NOT DETECTED Final   Staphylococcus species DETECTED (A) NOT DETECTED Final    Comment: CRITICAL RESULT CALLED TO, READ BACK BY AND VERIFIED WITH: T. DANG, PHARMD AT 1220 ON 09/16/18 BY C. JESSUP, MLT.    Staphylococcus aureus (BCID) DETECTED (A)  NOT DETECTED Final    Comment: Methicillin (oxacillin) susceptible Staphylococcus aureus (MSSA). Preferred therapy is anti staphylococcal beta lactam antibiotic (Cefazolin or Nafcillin), unless clinically contraindicated. CRITICAL RESULT CALLED TO, READ BACK BY AND VERIFIED WITH: T. DANG, PHARMD AT 1220 ON 09/16/18 BY C. JESSUP, MLT.    Methicillin resistance NOT DETECTED NOT DETECTED Final   Streptococcus species NOT DETECTED NOT DETECTED Final   Streptococcus agalactiae NOT DETECTED NOT DETECTED Final   Streptococcus pneumoniae NOT DETECTED NOT DETECTED Final   Streptococcus pyogenes NOT DETECTED NOT DETECTED Final   Acinetobacter baumannii NOT DETECTED NOT DETECTED Final   Enterobacteriaceae species NOT DETECTED NOT DETECTED Final   Enterobacter cloacae complex NOT DETECTED NOT DETECTED Final   Escherichia coli NOT DETECTED NOT DETECTED Final   Klebsiella oxytoca NOT DETECTED NOT DETECTED Final   Klebsiella pneumoniae NOT DETECTED NOT DETECTED Final   Proteus species NOT DETECTED NOT DETECTED Final   Serratia marcescens NOT DETECTED NOT DETECTED Final   Haemophilus influenzae NOT DETECTED NOT DETECTED Final   Neisseria meningitidis NOT DETECTED NOT DETECTED Final   Pseudomonas aeruginosa NOT DETECTED NOT DETECTED Final   Candida albicans NOT DETECTED NOT DETECTED Final   Candida glabrata NOT DETECTED NOT DETECTED Final   Candida krusei NOT DETECTED NOT DETECTED Final   Candida parapsilosis NOT DETECTED NOT DETECTED Final   Candida tropicalis NOT DETECTED NOT DETECTED Final    Comment: Performed at [facility], [place] [address], [place], [place] [address]  Culture, blood (single)     Status: None (Preliminary result)   Collection Time: 09/16/18  2:52 AM  Result Value Ref Range Status   Specimen Description BLOOD RIGHT HAND  Final   Special Requests   Final    BOTTLES DRAWN AEROBIC ONLY Blood Culture adequate volume   Culture  Final    NO GROWTH < 12 HOURS Performed at [place] [address], [place], [place] [address]    Report Status PENDING  Incomplete         Radiology Studies: Dg Chest Port 1 View  Result Date: 09/15/2018 CLINICAL DATA:  Shortness of breath fever EXAM: PORTABLE CHEST 1 VIEW COMPARISON:  05/13/2018 FINDINGS: Cardiac shadow is mildly enlarged. Dialysis catheter is again seen. The lungs are well aerated without focal infiltrate or sizable effusion. No bony abnormality is noted. IMPRESSION: No acute abnormality noted. Electronically Signed   By: Inez Catalina M.D.   On: 09/15/2018 21:54        Scheduled Meds:  sodium chloride   Intravenous Once   amLODipine  10 mg Oral Daily   calcium acetate  1,334 mg Oral TID WC   Chlorhexidine Gluconate Cloth  6 each Topical Q0600   cinacalcet  30 mg Oral Q breakfast   ferric citrate  210 mg Oral TID WC   labetalol  200 mg Oral BID   pantoprazole (PROTONIX) IV  40 mg Intravenous Q12H   vancomycin  500 mg Intravenous Q T,Th,Sa-HD   Continuous Infusions:  ceFEPime (MAXIPIME) IV 2 g (09/16/18 1619)     LOS: 1 day     Vernell Leep, MD, FACP, [facility]. [facility]  To contact the attending provider between 7A-7P or the covering provider during after hours 7P-7A, please log into the web site [email or web address] and access using universal [place] password for that web site. If you do not have the password, please call the hospital operator.  09/16/2018, 4:27 PM

## 2018-09-16 NOTE — Consult Note (Addendum)
[facility] Renal Consultation Note    Indication for Consultation:  Management of ESRD/hemodialysis, anemia, hypertension/volume, and secondary hyperparathyroidism. PCP:  HPI: Karen Morrow is a 58 y.o. female with ESRD, HTN, Hep C, GERD who was admitted with SIRS and symptomatic anemia.  Pt reports that she was in her usual state until 2 days ago when she developed dyspnea as well as profuse diarrhea and vomiting. She was not aware of blood present in stools. She missed her last dialysis session due to this. She was unclear if she had a fever. No CP. Eventually presented to the ED on 4/17 - noted to be febrile to 103.23F, confused, and tachycardic. Labs showed Na 135, K 4.1, normal LFTs, LA 1.9, ^ BNP, WBC 8.9, Hgb 5.3 with + FOBT. Ordered/given 1U PRBCs. COVID testing done and was negative.   Today, she was seen in her room. Reports feeling much better than yesterday - still with some dyspnea. No abd pains or CP.   Dialyzes on TTS schedule at [facility]. Her last HD was Tuesday 4/14, she missed her treatment on 4/16. Of note, her last Hgb was 11.9 on 09/07/18.  Past Medical History:  Diagnosis Date  . Anginal pain ([place])   . Asthma   . CHF (congestive heart failure) ([place])   . Chronic kidney disease    dialysis 3x wk  . Dyspnea   . Frequent bowel movements   . GERD (gastroesophageal reflux disease)   . Hepatitis C antibody test positive   . Hypertension    "just dx'd today" (11/03/2012)   Past Surgical History:  Procedure Laterality Date  . ABDOMINAL HYSTERECTOMY     "partial" (11/03/2012)  . AV FISTULA PLACEMENT Right 12/03/2013   Procedure: CREATION OF ARTERIOVENOUS (AV) FISTULA RIGHT ARM ;  Surgeon: Rosetta Posner, MD;  Location: [place];  Service: Vascular;  Laterality: Right;  . AV FISTULA PLACEMENT Left 05/17/2018   Procedure: CREATION OF BRACHIOCEPHALIC FISTULA LEFT ARM;  Surgeon: Waynetta Sandy, MD;  Location: [place];  Service: Vascular;   Laterality: Left;  . [place] REMOVAL Right 03/16/2018   Procedure: REMOVAL OF ARTERIOVENOUS GORETEX GRAFT ([place]) RIGHT ARM;  Surgeon: Waynetta Sandy, MD;  Location: [place];  Service: Vascular;  Laterality: Right;  . BASCILIC VEIN TRANSPOSITION Right 03/06/2014   Procedure: SECOND STAGE BASILIC VEIN TRANSPOSITION;  Surgeon: Rosetta Posner, MD;  Location: [facility] OR;  Service: Vascular;  Laterality: Right;  . ESOPHAGOGASTRODUODENOSCOPY N/A 06/05/2013   Procedure: ESOPHAGOGASTRODUODENOSCOPY (EGD);  Surgeon: Winfield Cunas., MD;  Location: [facility];  Service: Endoscopy;  Laterality: N/A;  . ESOPHAGOGASTRODUODENOSCOPY N/A 09/08/2016   Procedure: ESOPHAGOGASTRODUODENOSCOPY (EGD);  Surgeon: Milus Banister, MD;  Location: [place];  Service: Endoscopy;  Laterality: N/A;  . INSERTION OF DIALYSIS CATHETER Left 03/16/2018   Procedure: PLACEMENT OF TUNNEL DIALYSIS CATHETER;  Surgeon: Waynetta Sandy, MD;  Location: [place];  Service: Vascular;  Laterality: Left;  . REVISON OF ARTERIOVENOUS FISTULA Right 01/28/2017   Procedure: REVISON OF RIGHT ARM ARTERIOVENOUS FISTULA USING 8MMX10CM GORETEX GRAFT;  Surgeon: Angelia Mould, MD;  Location: [facility] OR;  Service: Vascular;  Laterality: Right;   Family History  Problem Relation Age of Onset  . Lupus Mother   . Diabetes Father   . Heart attack Father    Social History:  reports that she has been smoking cigarettes. She has a 10.00 pack-year smoking history. She has never used smokeless tobacco. She reports previous alcohol use of  about 1.0 standard drinks of alcohol per week. She reports current drug use. Drug: Marijuana.  ROS: As per HPI otherwise negative.  Physical Exam: Vitals:   09/16/18 0330 09/16/18 0345 09/16/18 0622 09/16/18 0800  BP: (!) 106/58 (!) 108/57 114/64 106/90  Pulse: 90 90 85 85  Resp: (!) 30 (!) 30 (!) 28 (!) 27  Temp: 99.5 F (37.5 C) 99.1 F (37.3 C) 100.3 F (37.9 C) 98.9 F (37.2 C)  TempSrc:  Oral Oral  Oral  SpO2: 100%  100% 100%  Weight:      Height:         General: Well developed, well nourished, in no acute distress. Head: Normocephalic, atraumatic, sclera non-icteric, mucus membranes are moist. Neck: Supple without lymphadenopathy/masses. JVD not elevated. Lungs: Clear bilaterally to auscultation without wheezes, rales, or rhonchi. Breathing is unlabored. Heart: RRR with normal S1, S2. No murmurs, rubs, or gallops appreciated. Abdomen: Soft, non-tender, non-distended with normoactive bowel sounds. No rebound/guarding. Musculoskeletal:  Strength and tone appear normal for age. Lower extremities: No edema or ischemic changes, no open wounds. Neuro: Alert and oriented X 3. Moves all extremities spontaneously. Psych:  Responds to questions appropriately with a normal affect. Dialysis Access: TDC in L chest, also with LUE AVF (unclear when can be used again, prev on hold s/p large infiltration).  Allergies  Allergen Reactions  . Cefazolin Other (See Comments)    Severe thrombocytopenia (has tolerated zosyn in the past) ID aware   Prior to Admission medications   Medication Sig Start Date End Date Taking? Authorizing Provider  albuterol (PROVENTIL HFA;VENTOLIN HFA) 108 (90 Base) MCG/ACT inhaler TAKE 2 PUFFS BY MOUTH EVERY 6 HOURS AS NEEDED FOR WHEEZE OR SHORTNESS OF BREATH Patient taking differently: Inhale 2 puffs into the lungs every 6 (six) hours as needed for wheezing.  05/09/18   [name], MD  albuterol (PROVENTIL) (2.5 MG/3ML) 0.083% nebulizer solution Take 3 mLs (2.5 mg total) by nebulization every 6 (six) hours as needed for wheezing or shortness of breath. MUST MAKE APPT FOR FURTHER REFILLS 08/15/18   [name], MD  amLODipine (NORVASC) 10 MG tablet Take 1 tablet (10 mg total) by mouth daily. 05/16/18   Thurnell Lose, MD  AURYXIA 1 GM 210 MG(Fe) tablet TAKE 1 TABLET BY MOUTH THREE TIMES A DAY WITH MEALS AND TWICE A DAY WITH SNACKS.   SWALLOW WHOLE, DO NOT  CHEW OR CRUSH MEDICATION 06/17/18   [provider]  calcium acetate (PHOSLO) 667 MG capsule Take 1,334 mg by mouth 3 (three) times daily with meals.     [provider]  cinacalcet (SENSIPAR) 30 MG tablet TAKE 1 TABLET BY MOUTH DAILY EVERY EVENING (DO NOT TAKE LESS THAN 12 HOURS PRIOR TO DIALYSIS) 06/08/18   [provider]  cloNIDine (CATAPRES) 0.1 MG tablet Take 1 tablet (0.1 mg total) by mouth 3 (three) times daily. 05/16/18   Thurnell Lose, MD  labetalol (NORMODYNE) 200 MG tablet Take 1 tablet (200 mg total) by mouth 2 (two) times daily. 05/16/18   Thurnell Lose, MD  lidocaine-prilocaine (EMLA) cream APPLY SMALL AMOUNT TO ACCESS SITE (AVF) 1 TO 2 HOURS BEFORE DIALYSIS. COVER WITH OCCLUSIVE DRESSING (SARAN WRAP) 06/15/18   [provider]  nicotine (NICODERM CQ) 14 mg/24hr patch Place 1 patch (14 mg total) onto the skin daily. START NICOTINE (NOICODERM) PATCH, ONE PATCH DAILY USING THE FOLLOWING DIRECTIONS:  APPLY ONE 21 MG PATCH DAILY FOR 6 WEEKS THEN, APPLY ONE 14  MG PATCH DAILY FOR 2-4 WEEKS THEN, APPLY ONE 7 MG PATCH DAILY FOR 2 WEEKS 07/07/18   Lorretta Harp, MD  nicotine (NICODERM CQ) 21 mg/24hr patch Place 1 patch (21 mg total) onto the skin daily. START NICOTINE (NOICODERM) PATCH, ONE PATCH DAILY USING THE FOLLOWING DIRECTIONS:  APPLY ONE 21 MG PATCH DAILY FOR 6 WEEKS THEN, APPLY ONE 14 MG PATCH DAILY FOR 2-4 WEEKS THEN, APPLY ONE 7 MG PATCH DAILY FOR 2 WEEKS 07/07/18   Lorretta Harp, MD  nicotine (NICODERM CQ) 7 mg/24hr patch Place 1 patch (7 mg total) onto the skin daily. START NICOTINE (NOICODERM) PATCH, ONE PATCH DAILY USING THE FOLLOWING DIRECTIONS:  APPLY ONE 21 MG PATCH DAILY FOR 6 WEEKS THEN, APPLY ONE 14 MG PATCH DAILY FOR 2-4 WEEKS THEN, APPLY ONE 7 MG PATCH DAILY FOR 2 WEEKS 07/07/18   Lorretta Harp, MD  oxyCODONE-acetaminophen (PERCOCET) 5-325 MG tablet Take 1 tablet by mouth every 6 (six) hours as needed for severe  pain. Patient not taking: Reported on 08/11/2018 05/17/18   Gabriel Earing, PA-C  pantoprazole (PROTONIX) 20 MG tablet Take 1 tablet (20 mg total) by mouth 2 (two) times daily before a meal. 11/23/17   Argentina Donovan, PA-C   Current Facility-Administered Medications  Medication Dose Route Frequency Provider Last Rate Last Dose  . albuterol (PROVENTIL) (2.5 MG/3ML) 0.083% nebulizer solution 2.5 mg  2.5 mg Nebulization Q6H PRN Adefeso, Oladapo, DO      . amLODipine (NORVASC) tablet 10 mg  10 mg Oral Daily Adefeso, Oladapo, DO      . calcium acetate (PHOSLO) capsule 1,334 mg  1,334 mg Oral TID WC Adefeso, Oladapo, DO   1,334 mg at 09/16/18 0925  . ceFEPIme (MAXIPIME) 2 g in sodium chloride 0.9 % 100 mL IVPB  2 g Intravenous Q T,Th,Sa-HD Pierce, Dwayne A, RPH      . Chlorhexidine Gluconate Cloth 2 % PADS 6 each  6 each Topical Q0600 Loren Racer, PA-C      . cinacalcet (SENSIPAR) tablet 30 mg  30 mg Oral Q breakfast Adefeso, Oladapo, DO   30 mg at 09/16/18 0924  . ferric citrate (AURYXIA) tablet 210 mg  210 mg Oral TID WC Adefeso, Oladapo, DO   210 mg at 09/16/18 0925  . labetalol (NORMODYNE) tablet 200 mg  200 mg Oral BID Adefeso, Oladapo, DO   200 mg at 09/16/18 0208  . pantoprazole (PROTONIX) injection 40 mg  40 mg Intravenous Q12H Adefeso, Oladapo, DO      . vancomycin (VANCOCIN) IVPB 500 mg/100 ml premix  500 mg Intravenous Q T,Th,Sa-HD Theotis Burrow, [facility]       Labs: Basic Metabolic Panel: Recent Labs  Lab 09/15/18 2153 09/15/18 2204 09/16/18 0252  NA 135 133* 135  K 4.1 4.0 4.0  CL 99  --  98  CO2 20*  --  21*  GLUCOSE 87  --  94  BUN 65*  --  70*  CREATININE 9.73*  --  9.84*  CALCIUM 9.1  --  9.0  PHOS 2.7  --   --    Liver Function Tests: Recent Labs  Lab 09/15/18 2153 09/16/18 0252  AST 29 28  ALT 20 18  ALKPHOS 64 57  BILITOT 0.8 0.9  PROT 5.5* 5.3*  ALBUMIN 2.6* 2.4*   Recent Labs  Lab 09/15/18 2153  LIPASE 34   Recent Labs  Lab 09/15/18 2153   AMMONIA 19   CBC: Recent  Labs  Lab 09/15/18 2153 09/15/18 2204 09/16/18 0252 09/16/18 0906  WBC 8.9  --  11.4* 12.0*  NEUTROABS 7.9*  --   --   --   HGB 5.3* 5.4* 4.8* 6.1*  HCT 16.8* 16.0* 14.6* 17.3*  MCV 93.3  --  90.1 84.0  PLT 168  --  156 150   Studies/Results: Dg Chest Port 1 View  Result Date: 09/15/2018 CLINICAL DATA:  Shortness of breath fever EXAM: PORTABLE CHEST 1 VIEW COMPARISON:  05/13/2018 FINDINGS: Cardiac shadow is mildly enlarged. Dialysis catheter is again seen. The lungs are well aerated without focal infiltrate or sizable effusion. No bony abnormality is noted. IMPRESSION: No acute abnormality noted. Electronically Signed   By: Inez Catalina M.D.   On: 09/15/2018 21:54   Dialysis Orders:  TTS at [facility] 3:45hr, 400/800, EDW 55.5kg, 2K/2.5Ca, TDC, no heparin - Hectoral 70mcg IV q HD - No ESA recently, last Hgb 11.9 on 09/07/2018  Assessment/Plan: 1.  Symptomatic anemia/ +FOBT: Huge drop in Hgb in < 2 weeks. 1U PRBCs given 4/17 - will try to give another with HD today if blood bank allows. Per primary, consider GI consult.  2.  SIRS (?): Vomiting/diarrhea prior to admit - Fever/AMS in ED - improved today. CXR clear. COVID negative. BCx drawn 4/17 - pending. On Vanc/Cefepime. WBC 12 today.  3.  ESRD: TTS scheduled - missed last HD, for HD today. No heparin 4.  Hypertension/volume: BP ok. UF as tolerated in setting of severe anemia. 5.  Metabolic bone disease: Labs ok, follow. 6.  Hep C 7.  GERD  ADDENDUM (2:45p): BCx + MSSA. She has a TDC in place, but also with AVF which is ready for use. Will place order to have [facility] removed after HD today - will plan to stick her AVF with next HD (Tues 4/21).     Veneta Penton, PA-C 09/16/2018, 10:24 AM  [facility] Pager: [phone]  Pt seen, examined and agree w A/P as above.  [facility] 09/16/2018, 2:50 PM

## 2018-09-16 NOTE — Procedures (Signed)
   I was present at this dialysis session, have reviewed the session itself and made  appropriate changes Kelly Splinter MD [facility] pager [phone]   09/16/2018, 2:51 PM

## 2018-09-16 NOTE — ED Notes (Signed)
Delay in lab draw,  MD at bedside. 

## 2018-09-16 NOTE — Progress Notes (Signed)
CRITICAL VALUE ALERT  Critical Value:  Hemoglobin 6.1  Date & Time Notied:  09/16/18 1000  Provider Notified: MD Hongalgi at bedside now with patient  Orders Received/Actions taken: 1 unit PRBCs to be given in HD.

## 2018-09-16 NOTE — Progress Notes (Signed)
Infectious disease wanted to know what site was used for dialysis. Was notified by Hemodialysis that pt hemodialysis catheter was used.

## 2018-09-16 NOTE — Consult Note (Signed)
Consult note for [facility]  Reason for Consult: Anemia Referring Physician: [facility]  Karen Morrow HPI: This is a 58 year old female with a PMH of ESRD on dialysis TTS, asthma, gastric ulcer 06/05/2013 and 09/08/2016, and CHF admitted to the hospital with dyspnea and fever to 103 F.  The history is obtained from the chart as she is a poor historian.  The patient missed her dialysis and in the ER she was noted to have AMS.  COVID-19 testing was negative.  She was started on antibiotics.  Her HGB showed a nadir of 4.8 g/dL and in 04/2018 her HGB was at 11 g/dL.  There is some equivocal history of melena when questioned.  Past Medical History:  Diagnosis Date  . Anginal pain ([place])   . Asthma   . CHF (congestive heart failure) ([place])   . Chronic kidney disease    dialysis 3x wk  . Dyspnea   . Frequent bowel movements   . GERD (gastroesophageal reflux disease)   . Hepatitis C antibody test positive   . Hypertension    "just dx'd today" (11/03/2012)    Past Surgical History:  Procedure Laterality Date  . ABDOMINAL HYSTERECTOMY     "partial" (11/03/2012)  . AV FISTULA PLACEMENT Right 12/03/2013   Procedure: CREATION OF ARTERIOVENOUS (AV) FISTULA RIGHT ARM ;  Surgeon: Rosetta Posner, MD;  Location: [facility];  Service: Vascular;  Laterality: Right;  . AV FISTULA PLACEMENT Left 05/17/2018   Procedure: CREATION OF BRACHIOCEPHALIC FISTULA LEFT ARM;  Surgeon: Waynetta Sandy, MD;  Location: [facility];  Service: Vascular;  Laterality: Left;  . [place] REMOVAL Right 03/16/2018   Procedure: REMOVAL OF ARTERIOVENOUS GORETEX GRAFT ([place]) RIGHT ARM;  Surgeon: Waynetta Sandy, MD;  Location: [facility];  Service: Vascular;  Laterality: Right;  . BASCILIC VEIN TRANSPOSITION Right 03/06/2014   Procedure: SECOND STAGE BASILIC VEIN TRANSPOSITION;  Surgeon: Rosetta Posner, MD;  Location: [facility] OR;  Service: Vascular;  Laterality: Right;  . ESOPHAGOGASTRODUODENOSCOPY N/A 06/05/2013   Procedure:  ESOPHAGOGASTRODUODENOSCOPY (EGD);  Surgeon: Winfield Cunas., MD;  Location: [facility];  Service: Endoscopy;  Laterality: N/A;  . ESOPHAGOGASTRODUODENOSCOPY N/A 09/08/2016   Procedure: ESOPHAGOGASTRODUODENOSCOPY (EGD);  Surgeon: Milus Banister, MD;  Location: [facility];  Service: Endoscopy;  Laterality: N/A;  . INSERTION OF DIALYSIS CATHETER Left 03/16/2018   Procedure: PLACEMENT OF TUNNEL DIALYSIS CATHETER;  Surgeon: Waynetta Sandy, MD;  Location: [facility];  Service: Vascular;  Laterality: Left;  . REVISON OF ARTERIOVENOUS FISTULA Right 01/28/2017   Procedure: REVISON OF RIGHT ARM ARTERIOVENOUS FISTULA USING 8MMX10CM GORETEX GRAFT;  Surgeon: Angelia Mould, MD;  Location: [facility] OR;  Service: Vascular;  Laterality: Right;    Family History  Problem Relation Age of Onset  . Lupus Mother   . Diabetes Father   . Heart attack Father     Social History:  reports that she has been smoking cigarettes. She has a 10.00 pack-year smoking history. She has never used smokeless tobacco. She reports previous alcohol use of about 1.0 standard drinks of alcohol per week. She reports current drug use. Drug: Marijuana.  Allergies:  Allergies  Allergen Reactions  . Cefazolin Other (See Comments)    Severe thrombocytopenia (has tolerated zosyn in the past) ID aware    Medications:  Scheduled: . sodium chloride   Intravenous Once  . amLODipine  10 mg Oral Daily  . calcium acetate  1,334 mg Oral TID WC  . Chlorhexidine  Gluconate Cloth  6 each Topical [id number]  . cinacalcet  30 mg Oral Q breakfast  . ferric citrate  210 mg Oral TID WC  . labetalol  200 mg Oral BID  . pantoprazole (PROTONIX) IV  40 mg Intravenous Q12H  . vancomycin  500 mg Intravenous Q T,Th,Sa-HD   Continuous: . ceFEPime (MAXIPIME) IV      Results for orders placed or performed during the hospital encounter of 09/15/18 (from the past 24 hour(s))  SARS Coronavirus 2 [facility] order, Performed in [place]  hospital lab)     Status: None   Collection Time: 09/15/18  9:12 PM  Result Value Ref Range   SARS Coronavirus 2 NEGATIVE NEGATIVE  Respiratory Panel by PCR     Status: None   Collection Time: 09/15/18  9:12 PM  Result Value Ref Range   Adenovirus NOT DETECTED NOT DETECTED   Coronavirus 229E NOT DETECTED NOT DETECTED   Coronavirus HKU1 NOT DETECTED NOT DETECTED   Coronavirus NL63 NOT DETECTED NOT DETECTED   Coronavirus OC43 NOT DETECTED NOT DETECTED   Metapneumovirus NOT DETECTED NOT DETECTED   Rhinovirus / Enterovirus NOT DETECTED NOT DETECTED   Influenza A NOT DETECTED NOT DETECTED   Influenza B NOT DETECTED NOT DETECTED   Parainfluenza Virus 1 NOT DETECTED NOT DETECTED   Parainfluenza Virus 2 NOT DETECTED NOT DETECTED   Parainfluenza Virus 3 NOT DETECTED NOT DETECTED   Parainfluenza Virus 4 NOT DETECTED NOT DETECTED   Respiratory Syncytial Virus NOT DETECTED NOT DETECTED   Bordetella pertussis NOT DETECTED NOT DETECTED   Chlamydophila pneumoniae NOT DETECTED NOT DETECTED   Mycoplasma pneumoniae NOT DETECTED NOT DETECTED  Brain natriuretic peptide     Status: Abnormal   Collection Time: 09/15/18  9:26 PM  Result Value Ref Range   B Natriuretic Peptide 3,309.9 (H) 0.0 - 100.0 pg/mL  Blood Culture (routine x 2)     Status: None (Preliminary result)   Collection Time: 09/15/18  9:30 PM  Result Value Ref Range   Specimen Description BLOOD RIGHT HAND    Special Requests      BOTTLES DRAWN AEROBIC AND ANAEROBIC Blood Culture adequate volume   Culture      NO GROWTH < 12 HOURS Performed at [facility], [address], [place], [place] [address]    Report Status PENDING   Blood Culture (routine x 2)     Status: None (Preliminary result)   Collection Time: 09/15/18  9:38 PM  Result Value Ref Range   Specimen Description BLOOD LEFT LOWER ARM    Special Requests      BOTTLES DRAWN AEROBIC AND ANAEROBIC Blood Culture adequate volume   Culture  Setup Time      GRAM POSITIVE  COCCI IN CLUSTERS AEROBIC BOTTLE ONLY CRITICAL RESULT CALLED TO, READ BACK BY AND VERIFIED WITH: T. DANG, PHARMD AT 1220 ON 09/16/18 BY C. JESSUP, MLT. Performed at [facility], [place] [address] [place], [place] [address]    Culture GRAM POSITIVE COCCI    Report Status PENDING   Blood Culture ID Panel (Reflexed)     Status: Abnormal   Collection Time: 09/15/18  9:38 PM  Result Value Ref Range   Enterococcus species NOT DETECTED NOT DETECTED   Listeria monocytogenes NOT DETECTED NOT DETECTED   Staphylococcus species DETECTED (A) NOT DETECTED   Staphylococcus aureus (BCID) DETECTED (A) NOT DETECTED   Methicillin resistance NOT DETECTED NOT DETECTED   Streptococcus species NOT DETECTED NOT  DETECTED   Streptococcus agalactiae NOT DETECTED NOT DETECTED   Streptococcus pneumoniae NOT DETECTED NOT DETECTED   Streptococcus pyogenes NOT DETECTED NOT DETECTED   Acinetobacter baumannii NOT DETECTED NOT DETECTED   Enterobacteriaceae species NOT DETECTED NOT DETECTED   Enterobacter cloacae complex NOT DETECTED NOT DETECTED   Escherichia coli NOT DETECTED NOT DETECTED   Klebsiella oxytoca NOT DETECTED NOT DETECTED   Klebsiella pneumoniae NOT DETECTED NOT DETECTED   Proteus species NOT DETECTED NOT DETECTED   Serratia marcescens NOT DETECTED NOT DETECTED   Haemophilus influenzae NOT DETECTED NOT DETECTED   Neisseria meningitidis NOT DETECTED NOT DETECTED   Pseudomonas aeruginosa NOT DETECTED NOT DETECTED   Candida albicans NOT DETECTED NOT DETECTED   Candida glabrata NOT DETECTED NOT DETECTED   Candida krusei NOT DETECTED NOT DETECTED   Candida parapsilosis NOT DETECTED NOT DETECTED   Candida tropicalis NOT DETECTED NOT DETECTED  Lactic acid, plasma     Status: None   Collection Time: 09/15/18  9:53 PM  Result Value Ref Range   Lactic Acid, Venous 1.9 0.5 - 1.9 mmol/L  Comprehensive metabolic panel     Status: Abnormal   Collection Time: 09/15/18  9:53 PM  Result Value Ref Range    Sodium 135 135 - 145 mmol/L   Potassium 4.1 3.5 - 5.1 mmol/L   Chloride 99 98 - 111 mmol/L   CO2 20 (L) 22 - 32 mmol/L   Glucose, Bld 87 70 - 99 mg/dL   BUN 65 (H) 6 - 20 mg/dL   Creatinine, Ser 9.73 (H) 0.44 - 1.00 mg/dL   Calcium 9.1 8.9 - 10.3 mg/dL   Total Protein 5.5 (L) 6.5 - 8.1 g/dL   Albumin 2.6 (L) 3.5 - 5.0 g/dL   AST 29 15 - 41 U/L   ALT 20 0 - 44 U/L   Alkaline Phosphatase 64 38 - 126 U/L   Total Bilirubin 0.8 0.3 - 1.2 mg/dL   GFR calc non Af Amer 4 (L) >60 mL/min   GFR calc Af Amer 5 (L) >60 mL/min   Anion gap 16 (H) 5 - 15  CBC WITH DIFFERENTIAL     Status: Abnormal   Collection Time: 09/15/18  9:53 PM  Result Value Ref Range   WBC 8.9 4.0 - 10.5 K/uL   RBC 1.80 (L) 3.87 - 5.11 MIL/uL   Hemoglobin 5.3 (LL) 12.0 - 15.0 g/dL   HCT 16.8 (L) 36.0 - 46.0 %   MCV 93.3 80.0 - 100.0 fL   MCH 29.4 26.0 - 34.0 pg   MCHC 31.5 30.0 - 36.0 g/dL   RDW 16.2 (H) 11.5 - 15.5 %   Platelets 168 150 - 400 K/uL   nRBC 0.0 0.0 - 0.2 %   Neutrophils Relative % 88 %   Neutro Abs 7.9 (H) 1.7 - 7.7 K/uL   Lymphocytes Relative 7 %   Lymphs Abs 0.6 (L) 0.7 - 4.0 K/uL   Monocytes Relative 4 %   Monocytes Absolute 0.3 0.1 - 1.0 K/uL   Eosinophils Relative 0 %   Eosinophils Absolute 0.0 0.0 - 0.5 K/uL   Basophils Relative 0 %   Basophils Absolute 0.0 0.0 - 0.1 K/uL   Immature Granulocytes 1 %   Abs Immature Granulocytes 0.05 0.00 - 0.07 K/uL  Lipase, blood     Status: None   Collection Time: 09/15/18  9:53 PM  Result Value Ref Range   Lipase 34 11 - 51 U/L  Procalcitonin  Status: None   Collection Time: 09/15/18  9:53 PM  Result Value Ref Range   Procalcitonin 0.85 ng/mL  Protime-INR     Status: None   Collection Time: 09/15/18  9:53 PM  Result Value Ref Range   Prothrombin Time 13.5 11.4 - 15.2 seconds   INR 1.0 0.8 - 1.2  Ammonia     Status: None   Collection Time: 09/15/18  9:53 PM  Result Value Ref Range   Ammonia 19 9 - 35 umol/L  Magnesium     Status: None    Collection Time: 09/15/18  9:53 PM  Result Value Ref Range   Magnesium 1.8 1.7 - 2.4 mg/dL  Phosphorus     Status: None   Collection Time: 09/15/18  9:53 PM  Result Value Ref Range   Phosphorus 2.7 2.5 - 4.6 mg/dL  POCT I-Stat EG7     Status: Abnormal   Collection Time: 09/15/18 10:04 PM  Result Value Ref Range   pH, Ven 7.538 (H) 7.250 - 7.430   pCO2, Ven 25.9 (L) 44.0 - 60.0 mmHg   pO2, Ven 40.0 32.0 - 45.0 mmHg   Bicarbonate 22.0 20.0 - 28.0 mmol/L   TCO2 23 22 - 32 mmol/L   O2 Saturation 83.0 %   Acid-base deficit 1.0 0.0 - 2.0 mmol/L   Sodium 133 (L) 135 - 145 mmol/L   Potassium 4.0 3.5 - 5.1 mmol/L   Calcium, Ion 1.05 (L) 1.15 - 1.40 mmol/L   HCT 16.0 (L) 36.0 - 46.0 %   Hemoglobin 5.4 (LL) 12.0 - 15.0 g/dL   Patient temperature HIDE    Sample type VENOUS    Comment NOTIFIED PHYSICIAN   Prepare RBC     Status: None   Collection Time: 09/15/18 10:41 PM  Result Value Ref Range   Order Confirmation      ORDER PROCESSED BY BLOOD BANK Performed at [facility], [address], [place], [place] [address]   Salicylate level     Status: None   Collection Time: 09/15/18 11:16 PM  Result Value Ref Range   Salicylate Lvl <6.0 2.8 - 30.0 mg/dL  Acetaminophen level     Status: Abnormal   Collection Time: 09/15/18 11:16 PM  Result Value Ref Range   Acetaminophen (Tylenol), Serum <10 (L) 10 - 30 ug/mL  Type and screen [place]     Status: None (Preliminary result)   Collection Time: 09/15/18 11:35 PM  Result Value Ref Range   ABO/RH(D) O POS    Antibody Screen NEG    Sample Expiration      09/18/2018 Performed at [facility], [place] [address], [place], [place] [address]    Unit Number [id number]    Blood Component Type RBC LR PHER2    Unit division 00    Status of Unit ISSUED    Transfusion Status OK TO TRANSFUSE    Crossmatch Result Compatible    Unit Number [id number]    Blood Component Type RED CELLS,LR    Unit division 00     Status of Unit ALLOCATED    Transfusion Status OK TO TRANSFUSE    Crossmatch Result Compatible   POC occult blood, ED     Status: Abnormal   Collection Time: 09/15/18 11:40 PM  Result Value Ref Range   Fecal Occult Bld POSITIVE (A) NEGATIVE  Lactic acid, plasma     Status: None   Collection Time: 09/16/18  2:52 AM  Result Value  Ref Range   Lactic Acid, Venous 1.1 0.5 - 1.9 mmol/L  Culture, blood (single)     Status: None (Preliminary result)   Collection Time: 09/16/18  2:52 AM  Result Value Ref Range   Specimen Description BLOOD RIGHT HAND    Special Requests      BOTTLES DRAWN AEROBIC ONLY Blood Culture adequate volume   Culture      NO GROWTH < 12 HOURS Performed at [facility] [address] [place], [place] [address]    Report Status PENDING   Comprehensive metabolic panel     Status: Abnormal   Collection Time: 09/16/18  2:52 AM  Result Value Ref Range   Sodium 135 135 - 145 mmol/L   Potassium 4.0 3.5 - 5.1 mmol/L   Chloride 98 98 - 111 mmol/L   CO2 21 (L) 22 - 32 mmol/L   Glucose, Bld 94 70 - 99 mg/dL   BUN 70 (H) 6 - 20 mg/dL   Creatinine, Ser 9.84 (H) 0.44 - 1.00 mg/dL   Calcium 9.0 8.9 - 10.3 mg/dL   Total Protein 5.3 (L) 6.5 - 8.1 g/dL   Albumin 2.4 (L) 3.5 - 5.0 g/dL   AST 28 15 - 41 U/L   ALT 18 0 - 44 U/L   Alkaline Phosphatase 57 38 - 126 U/L   Total Bilirubin 0.9 0.3 - 1.2 mg/dL   GFR calc non Af Amer 4 (L) >60 mL/min   GFR calc Af Amer 5 (L) >60 mL/min   Anion gap 16 (H) 5 - 15  CBC     Status: Abnormal   Collection Time: 09/16/18  2:52 AM  Result Value Ref Range   WBC 11.4 (H) 4.0 - 10.5 K/uL   RBC 1.62 (L) 3.87 - 5.11 MIL/uL   Hemoglobin 4.8 (LL) 12.0 - 15.0 g/dL   HCT 14.6 (L) 36.0 - 46.0 %   MCV 90.1 80.0 - 100.0 fL   MCH 29.6 26.0 - 34.0 pg   MCHC 32.9 30.0 - 36.0 g/dL   RDW 15.9 (H) 11.5 - 15.5 %   Platelets 156 150 - 400 K/uL   nRBC 0.3 (H) 0.0 - 0.2 %  Prepare RBC     Status: None   Collection Time: 09/16/18  4:35 AM  Result  Value Ref Range   Order Confirmation      ORDER PROCESSED BY BLOOD BANK Performed at [facility], [address], [place], [place] [address]   CBC     Status: Abnormal   Collection Time: 09/16/18  9:06 AM  Result Value Ref Range   WBC 12.0 (H) 4.0 - 10.5 K/uL   RBC 2.06 (L) 3.87 - 5.11 MIL/uL   Hemoglobin 6.1 (LL) 12.0 - 15.0 g/dL   HCT 17.3 (L) 36.0 - 46.0 %   MCV 84.0 80.0 - 100.0 fL   MCH 29.6 26.0 - 34.0 pg   MCHC 35.3 30.0 - 36.0 g/dL   RDW 16.1 (H) 11.5 - 15.5 %   Platelets 150 150 - 400 K/uL   nRBC 0.2 0.0 - 0.2 %  Prepare RBC     Status: None   Collection Time: 09/16/18 11:47 AM  Result Value Ref Range   Order Confirmation      ORDER PROCESSED BY BLOOD BANK Performed at [facility], [address], [place], [place] [address]      Dg Chest Port 1 View  Result Date: 09/15/2018 CLINICAL DATA:  Shortness  of breath fever EXAM: PORTABLE CHEST 1 VIEW COMPARISON:  05/13/2018 FINDINGS: Cardiac shadow is mildly enlarged. Dialysis catheter is again seen. The lungs are well aerated without focal infiltrate or sizable effusion. No bony abnormality is noted. IMPRESSION: No acute abnormality noted. Electronically Signed   By: Inez Catalina M.D.   On: 09/15/2018 21:54    ROS:  As stated above in the HPI otherwise negative.  Blood pressure 106/90, pulse 85, temperature 98.9 F (37.2 C), temperature source Oral, resp. rate (!) 27, height 5\' 7"  (1.702 m), weight 57.2 kg, SpO2 100 %.    PE: Gen: Slow mentation. HEENT:  [place]/AT, EOMI Neck: Supple, no LAD Lungs: CTA Bilaterally CV: RRR without M/G/R ABM: Soft, NTND, +BS Ext: No C/C/E  Assessment/Plan: 1) Anemia. 2) Heme positive stool. 3) History of gastric ulceration.   It is likely that the patient has a chronic gastric ulcer.  The HGB has dropped significantly and a repeat EGD is warranted.  She is currently hemodynamically stable.  Plan: 1) EGD tomorrow.  Deontray Hunnicutt D 09/16/2018, 12:28 PM

## 2018-09-16 NOTE — H&P (Signed)
History and Physical  Karen Morrow QDI:[id number] DOB: [birth year] DOA: 09/15/2018  Referring physician: ED physician PCP: [name], MD  Patient coming from: Home & is able to ambulate without any assistive device Chief Complaint: Fever and altered mental status  HPI: Karen Morrow is a 58 y.o. female with past medical history significant for CHF, ESRD on dialysis (T/Th/Sat), GERD and hypertension who presents to the emergency department via EMS due to fever of 103.37F, nonproductive cough, shortness of breath and altered mental status which started yesterday.  Patient was unable to provide history on why she is in the emergency department possibly due to altered mental status, though she was able to answer review of system questions, history was obtained from ED physician and ED chart review.  Per report, patient missed dialysis appointment yesterday and started having fever, shortness of breath, cough and chills this evening, she was lethargic and could barely stay awake to answer questions.  No alleviating or aggravating factor noted by family, and there was no report of nausea, vomiting, chest pain, headache, abdominal pain.  There was no report of sick contact or any contact with someone with COVID-19.   ED Course:  In the emergency department, patient was noted to be febrile (temp 103.37F), tachycardic and tachypneic, but BP was 142/86.  Work-up in the ED showed anemia with hemoglobin/hematocrit of 5.3/16.9, FOBT was positive.  ABG done showed no hypoxic, but patient was alkalotic.  Type and screen was done and patient was started on 1 unit of PRBC.  He was noted to be mildly hyponatremic (Na 133), BUN/creatinine 65/9.73,  Coronavirus was checked and was negative. procalcitonin was elevated at 0.85. Chest x-ray showed no acute abnormality.  Patient was started on IV antibiotics (IV vancomycin and cefepime) and acetaminophen 650mg  was given rectally.  Hospitalist was contacted for  further evaluation and management.  Review of Systems: Constitutional: Fever and chills.  HENT: Negative for ear pain and sore throat.   Eyes: Negative for pain and visual disturbance.  Respiratory: Cough and shortness of breath.  No chest tightness  Cardiovascular: Negative for chest pain and palpitations.  Gastrointestinal: Negative for abdominal pain and vomiting.  Endocrine: Negative for polyphagia and polyuria.  Genitourinary: Negative for decreased urine volume, dysuria, enuresis, hematuria. Musculoskeletal: Negative for arthralgias and back pain.  Skin: Negative for color change and rash.  Allergic/Immunologic: Negative for immunocompromised state.  Neurological: Negative for tremors, syncope, speech difficulty, weakness, light-headedness and headaches.  Hematological: Does not bruise/bleed easily.  All other systems reviewed and are negative     Past Medical History:  Diagnosis Date  . Anginal pain ([place])   . Asthma   . CHF (congestive heart failure) ([place])   . Chronic kidney disease    dialysis 3x wk  . Dyspnea   . Frequent bowel movements   . GERD (gastroesophageal reflux disease)   . Hepatitis C antibody test positive   . Hypertension    "just dx'd today" (11/03/2012)   Past Surgical History:  Procedure Laterality Date  . ABDOMINAL HYSTERECTOMY     "partial" (11/03/2012)  . AV FISTULA PLACEMENT Right 12/03/2013   Procedure: CREATION OF ARTERIOVENOUS (AV) FISTULA RIGHT ARM ;  Surgeon: Rosetta Posner, MD;  Location: [facility];  Service: Vascular;  Laterality: Right;  . AV FISTULA PLACEMENT Left 05/17/2018   Procedure: CREATION OF BRACHIOCEPHALIC FISTULA LEFT ARM;  Surgeon: Waynetta Sandy, MD;  Location: [facility];  Service: Vascular;  Laterality: Left;  . [place]  REMOVAL Right 03/16/2018   Procedure: REMOVAL OF ARTERIOVENOUS GORETEX GRAFT ([place]) RIGHT ARM;  Surgeon: Waynetta Sandy, MD;  Location: [facility];  Service: Vascular;  Laterality: Right;  . BASCILIC VEIN  TRANSPOSITION Right 03/06/2014   Procedure: SECOND STAGE BASILIC VEIN TRANSPOSITION;  Surgeon: Rosetta Posner, MD;  Location: [facility] OR;  Service: Vascular;  Laterality: Right;  . ESOPHAGOGASTRODUODENOSCOPY N/A 06/05/2013   Procedure: ESOPHAGOGASTRODUODENOSCOPY (EGD);  Surgeon: Winfield Cunas., MD;  Location: [facility];  Service: Endoscopy;  Laterality: N/A;  . ESOPHAGOGASTRODUODENOSCOPY N/A 09/08/2016   Procedure: ESOPHAGOGASTRODUODENOSCOPY (EGD);  Surgeon: Milus Banister, MD;  Location: [facility];  Service: Endoscopy;  Laterality: N/A;  . INSERTION OF DIALYSIS CATHETER Left 03/16/2018   Procedure: PLACEMENT OF TUNNEL DIALYSIS CATHETER;  Surgeon: Waynetta Sandy, MD;  Location: [facility];  Service: Vascular;  Laterality: Left;  . REVISON OF ARTERIOVENOUS FISTULA Right 01/28/2017   Procedure: REVISON OF RIGHT ARM ARTERIOVENOUS FISTULA USING 8MMX10CM GORETEX GRAFT;  Surgeon: Angelia Mould, MD;  Location: [facility];  Service: Vascular;  Laterality: Right;    Social History:  reports that she has been smoking cigarettes. She has a 10.00 pack-year smoking history. She has never used smokeless tobacco. She reports previous alcohol use of about 1.0 standard drinks of alcohol per week. She reports current drug use. Drug: Marijuana.   Allergies  Allergen Reactions  . Cefazolin Other (See Comments)    Severe thrombocytopenia (has tolerated zosyn in the past) ID aware    Family History  Problem Relation Age of Onset  . Lupus Mother   . Diabetes Father   . Heart attack Father       Prior to Admission medications   Medication Sig Start Date End Date Taking? Authorizing Provider  albuterol (PROVENTIL HFA;VENTOLIN HFA) 108 (90 Base) MCG/ACT inhaler TAKE 2 PUFFS BY MOUTH EVERY 6 HOURS AS NEEDED FOR WHEEZE OR SHORTNESS OF BREATH Patient taking differently: Inhale 2 puffs into the lungs every 6 (six) hours as needed for wheezing.  05/09/18   [name], MD  albuterol (PROVENTIL)  (2.5 MG/3ML) 0.083% nebulizer solution Take 3 mLs (2.5 mg total) by nebulization every 6 (six) hours as needed for wheezing or shortness of breath. MUST MAKE APPT FOR FURTHER REFILLS 08/15/18   [name], MD  amLODipine (NORVASC) 10 MG tablet Take 1 tablet (10 mg total) by mouth daily. 05/16/18   Thurnell Lose, MD  AURYXIA 1 GM 210 MG(Fe) tablet TAKE 1 TABLET BY MOUTH THREE TIMES A DAY WITH MEALS AND TWICE A DAY WITH SNACKS.   SWALLOW WHOLE, DO NOT CHEW OR CRUSH MEDICATION 06/17/18   [provider]  calcium acetate (PHOSLO) 667 MG capsule Take 1,334 mg by mouth 3 (three) times daily with meals.     [provider]  cinacalcet (SENSIPAR) 30 MG tablet TAKE 1 TABLET BY MOUTH DAILY EVERY EVENING (DO NOT TAKE LESS THAN 12 HOURS PRIOR TO DIALYSIS) 06/08/18   [provider]  cloNIDine (CATAPRES) 0.1 MG tablet Take 1 tablet (0.1 mg total) by mouth 3 (three) times daily. 05/16/18   Thurnell Lose, MD  labetalol (NORMODYNE) 200 MG tablet Take 1 tablet (200 mg total) by mouth 2 (two) times daily. 05/16/18   Thurnell Lose, MD  lidocaine-prilocaine (EMLA) cream APPLY SMALL AMOUNT TO ACCESS SITE (AVF) 1 TO 2 HOURS BEFORE DIALYSIS. COVER WITH OCCLUSIVE DRESSING [place]AP) 06/15/18   [provider]  nicotine (NICODERM CQ) 14 mg/24hr patch Place  1 patch (14 mg total) onto the skin daily. START NICOTINE (NOICODERM) PATCH, ONE PATCH DAILY USING THE FOLLOWING DIRECTIONS:  APPLY ONE 21 MG PATCH DAILY FOR 6 WEEKS THEN, APPLY ONE 14 MG PATCH DAILY FOR 2-4 WEEKS THEN, APPLY ONE 7 MG PATCH DAILY FOR 2 WEEKS 07/07/18   Lorretta Harp, MD  nicotine (NICODERM CQ) 21 mg/24hr patch Place 1 patch (21 mg total) onto the skin daily. START NICOTINE (NOICODERM) PATCH, ONE PATCH DAILY USING THE FOLLOWING DIRECTIONS:  APPLY ONE 21 MG PATCH DAILY FOR 6 WEEKS THEN, APPLY ONE 14 MG PATCH DAILY FOR 2-4 WEEKS THEN, APPLY ONE 7 MG PATCH DAILY FOR 2 WEEKS 07/07/18   Lorretta Harp, MD   nicotine (NICODERM CQ) 7 mg/24hr patch Place 1 patch (7 mg total) onto the skin daily. START NICOTINE (NOICODERM) PATCH, ONE PATCH DAILY USING THE FOLLOWING DIRECTIONS:  APPLY ONE 21 MG PATCH DAILY FOR 6 WEEKS THEN, APPLY ONE 14 MG PATCH DAILY FOR 2-4 WEEKS THEN, APPLY ONE 7 MG PATCH DAILY FOR 2 WEEKS 07/07/18   Lorretta Harp, MD  oxyCODONE-acetaminophen (PERCOCET) 5-325 MG tablet Take 1 tablet by mouth every 6 (six) hours as needed for severe pain. Patient not taking: Reported on 08/11/2018 05/17/18   Gabriel Earing, PA-C  pantoprazole (PROTONIX) 20 MG tablet Take 1 tablet (20 mg total) by mouth 2 (two) times daily before a meal. 11/23/17   Argentina Donovan, PA-C    Physical Exam: BP (!) 108/57   Pulse 90   Temp 99.1 F (37.3 C) (Oral)   Resp (!) 30   Ht 5\' 7"  (1.702 m)   Wt 57.2 kg   SpO2 100%   BMI 19.73 kg/m   . General: 58 y.o. year-old female well developed well nourished in no acute distress.  Lethargic though easily aroused and oriented x3. Marland Kitchen HEENT: Normocephalic, atraumatic, EOMI . Cardiovascular: Tachycardia, regular rate and rhythm with no rubs or gallops.  No JVD noted.  No lower extremity edema. 2/4 pulses in all 4 extremities. Marland Kitchen Respiratory: Tachypneic.  Clear to auscultation with no wheezes or rales. Good inspiratory effort. . Abdomen: FOBT positive.  Stool dark and tarry without any gross blood at the rectum.  Soft nontender nondistended with normal bowel sounds x4 quadrants. . Muskuloskeletal: Bilateral fistulas noted in upper extremities.  Dialysis catheter noted in left subclavian area.  No cyanosis, clubbing or edema noted bilaterally . Neuro: CN II-XII intact, strength, sensation, reflexes . Skin: No ulcerative lesions noted or rashes . Psychiatry: Judgement and insight appear normal. Mood is appropriate for condition and setting          Labs on Admission:  Basic Metabolic Panel: Recent Labs  Lab 09/15/18 2153 09/15/18 2204 09/16/18 0252  NA 135  133* 135  K 4.1 4.0 4.0  CL 99  --  98  CO2 20*  --  21*  GLUCOSE 87  --  94  BUN 65*  --  70*  CREATININE 9.73*  --  9.84*  CALCIUM 9.1  --  9.0  MG 1.8  --   --   PHOS 2.7  --   --    Liver Function Tests: Recent Labs  Lab 09/15/18 2153 09/16/18 0252  AST 29 28  ALT 20 18  ALKPHOS 64 57  BILITOT 0.8 0.9  PROT 5.5* 5.3*  ALBUMIN 2.6* 2.4*   Recent Labs  Lab 09/15/18 2153  LIPASE 34   Recent Labs  Lab 09/15/18 2153  AMMONIA 19   CBC: Recent Labs  Lab 09/15/18 2153 09/15/18 2204 09/16/18 0252  WBC 8.9  --  11.4*  NEUTROABS 7.9*  --   --   HGB 5.3* 5.4* 4.8*  HCT 16.8* 16.0* 14.6*  MCV 93.3  --  90.1  PLT 168  --  156   Cardiac Enzymes: No results for input(s): CKTOTAL, CKMB, CKMBINDEX, TROPONINI in the last 168 hours.  BNP (last 3 results) Recent Labs    05/13/18 0539 09/15/18 2126  BNP >4,500.0* 3,309.9*    ProBNP (last 3 results) No results for input(s): PROBNP in the last 8760 hours.  CBG: No results for input(s): GLUCAP in the last 168 hours.  Radiological Exams on Admission: Dg Chest Port 1 View  Result Date: 09/15/2018 CLINICAL DATA:  Shortness of breath fever EXAM: PORTABLE CHEST 1 VIEW COMPARISON:  05/13/2018 FINDINGS: Cardiac shadow is mildly enlarged. Dialysis catheter is again seen. The lungs are well aerated without focal infiltrate or sizable effusion. No bony abnormality is noted. IMPRESSION: No acute abnormality noted. Electronically Signed   By: Inez Catalina M.D.   On: 09/15/2018 21:54    EKG: I independently viewed the EKG done and my findings are as followed: Sinus tachycardia at a rate of 133bpm with T wave inversion in 1 aVL V5 and V6  Assessment/Plan Present on Admission: . Acute GI bleeding . HTN (hypertension) . ESRD (end stage renal disease) ([place]) . Gastroesophageal reflux disease . Sepsis ([place]) . Gastric ulcer  Principal Problem:   Sepsis ([place]) Active Problems:   Gastric ulcer   HTN (hypertension)   ESRD (end  stage renal disease) ([facility])   Gastroesophageal reflux disease   Anemia in chronic kidney disease, on chronic dialysis ([facility])   Acute GI bleeding   Symptomatic anemia   Severe systemic inflammatory response syndrome (SIRS) ([facility])   Altered mental status   Tobacco abuse   SIRS positive with suspicion for sepsis Patient presents to the emergency department with fever, tachycardia and tachypnea.  Procalcitonin was elevated at 0.85, but no source of infection noted at this time.  Chest x-ray was negative for pneumonia.  She was started on IV Vancomycin and cefepime due to presumed respiratory infection, we shall continue with same at this time with plan to de-escalate based on blood culture and sputum culture.  Gastric ulcer/GI bleed/Symptomatic anemia FOBT was positive but was without bright red blood.  Patient has a history of gastric ulcer per medical record.  Type and screen was done in the ED and patient was started on IV transfusion of 1 unit of blood, an extra unit of blood will be transfused based on H/H.  IV Lasix 20 mg will be given prior to cycle units of blood transfusion (if BP permits) Considering patient's history of end-stage renal disease, this will also contribute to the patient's low hemoglobin. Continue IV Protonix Patient will need outpatient GI follow-up.  I would consider inpatient GI follow-up if patient's hemoglobin continues to decrease.  Altered mental status possible secondary to above (improving) Patient was lethargic and could not answer any question on arrival at the ED, she was still lethargic during physical examination, but was easily aroused and was able to answer simple questions to her review of system questions.  At baseline, patient was able to take care of her ADL and was always alert and oriented x4.  End-stage renal disease BUN/creatinine at 65/9.73 Patient missed dialysis on Thursday. She has been scheduled for dialysis this morning.  GERD  Continue  Protonix   DVT prophylaxis: SCD  Code Status: Full code Family Communication: No family at bedside  Disposition Plan: Patient will be admitted to progressive unit with plan to discharge patient home possibly within 48 to 72 hours.  Consults called: None Admission status: Inpatient admission    Bernadette Hoit MD [facility] 09/16/2018, 4:46 AM

## 2018-09-17 ENCOUNTER — Encounter (HOSPITAL_COMMUNITY)
Admission: EM | Disposition: A | Discharge status: Home / Self Care | Payer: Self-pay | Source: Home / Self Care | Attending: Internal Medicine

## 2018-09-17 ENCOUNTER — Inpatient Hospital Stay (HOSPITAL_COMMUNITY): Payer: Medicare Other

## 2018-09-17 ENCOUNTER — Encounter (HOSPITAL_COMMUNITY): Payer: Self-pay

## 2018-09-17 ENCOUNTER — Inpatient Hospital Stay (HOSPITAL_COMMUNITY): Payer: Medicare Other | Admitting: Anesthesiology

## 2018-09-17 DIAGNOSIS — G9341 Metabolic encephalopathy: Secondary | ICD-10-CM

## 2018-09-17 DIAGNOSIS — K254 Chronic or unspecified gastric ulcer with hemorrhage: Secondary | ICD-10-CM

## 2018-09-17 HISTORY — PX: ESOPHAGOGASTRODUODENOSCOPY (EGD) WITH PROPOFOL: SHX5813

## 2018-09-17 HISTORY — PX: IR REMOVAL TUN CV CATH W/O FL: IMG2289

## 2018-09-17 LAB — CBC
HCT: 19.9 % — ABNORMAL LOW (ref 36.0–46.0)
Hemoglobin: 6.6 g/dL — CL (ref 12.0–15.0)
MCH: 28.7 pg (ref 26.0–34.0)
MCHC: 33.2 g/dL (ref 30.0–36.0)
MCV: 86.5 fL (ref 80.0–100.0)
Platelets: 122 10*3/uL — ABNORMAL LOW (ref 150–400)
RBC: 2.3 MIL/uL — ABNORMAL LOW (ref 3.87–5.11)
RDW: 17 % — ABNORMAL HIGH (ref 11.5–15.5)
WBC: 6.2 10*3/uL (ref 4.0–10.5)
nRBC: 0.6 % — ABNORMAL HIGH (ref 0.0–0.2)

## 2018-09-17 LAB — HEMOGLOBIN AND HEMATOCRIT, BLOOD
HCT: 26.1 % — ABNORMAL LOW (ref 36.0–46.0)
Hemoglobin: 8.9 g/dL — ABNORMAL LOW (ref 12.0–15.0)

## 2018-09-17 LAB — PREPARE RBC (CROSSMATCH)

## 2018-09-17 SURGERY — ESOPHAGOGASTRODUODENOSCOPY (EGD) WITH PROPOFOL
Anesthesia: Monitor Anesthesia Care

## 2018-09-17 MED ORDER — PROPOFOL 500 MG/50ML IV EMUL
INTRAVENOUS | Status: DC | PRN
  Administered 2018-09-17: 75 ug/kg/min via INTRAVENOUS

## 2018-09-17 MED ORDER — PROPOFOL 10 MG/ML IV BOLUS
INTRAVENOUS | Status: DC | PRN
  Administered 2018-09-17 (×2): 20 mg via INTRAVENOUS

## 2018-09-17 MED ORDER — TRAZODONE HCL 50 MG PO TABS: 50.0000 mg | ORAL_TABLET | Freq: Once | ORAL | Status: DC

## 2018-09-17 MED ORDER — METOPROLOL TARTRATE 25 MG PO TABS
25.0000 mg | ORAL_TABLET | Freq: Two times a day (BID) | ORAL | Status: DC
  Administered 2018-09-17 – 2018-09-19 (×4): 25 mg via ORAL
  Filled 2018-09-17 (×4): qty 1

## 2018-09-17 MED ORDER — RENA-VITE PO TABS
1.0000 | ORAL_TABLET | Freq: Every day | ORAL | Status: DC
  Administered 2018-09-17 – 2018-09-18 (×2): 1 via ORAL
  Filled 2018-09-17 (×2): qty 1

## 2018-09-17 MED ORDER — LIDOCAINE 2% (20 MG/ML) 5 ML SYRINGE
INTRAMUSCULAR | Status: DC | PRN
  Administered 2018-09-17: 80 mg via INTRAVENOUS

## 2018-09-17 MED ORDER — SODIUM CHLORIDE 0.9 % IV SOLN
2.0000 g | INTRAVENOUS | Status: DC
  Administered 2018-09-19: 2 g via INTRAVENOUS
  Filled 2018-09-17 (×2): qty 2

## 2018-09-17 MED ORDER — VANCOMYCIN HCL IN DEXTROSE 500-5 MG/100ML-% IV SOLN: 500.0000 mg | INTRAVENOUS | Status: DC

## 2018-09-17 SURGICAL SUPPLY — 14 items

## 2018-09-17 NOTE — Transfer of Care (Signed)
Immediate Anesthesia Transfer of Care Note  Patient: Karen Morrow  Procedure(s) Performed: ESOPHAGOGASTRODUODENOSCOPY (EGD) WITH PROPOFOL (N/A )  Patient Location: PACU and Endoscopy Unit  Anesthesia Type:MAC  Level of Consciousness: awake, alert  and oriented  Airway & Oxygen Therapy: Patient Spontanous Breathing  Post-op Assessment: Report given to RN and Post -op Vital signs reviewed and stable  Post vital signs: Reviewed and stable  Last Vitals:  Vitals Value Taken Time  BP    Temp    Pulse    Resp    SpO2      Last Pain:  Vitals:   09/17/18 0816  TempSrc: Oral  PainSc: 0-No pain         Complications: No apparent anesthesia complications

## 2018-09-17 NOTE — Procedures (Signed)
PROCEDURE SUMMARY:  Successful removal of tunneled hemodialysis catheter.  Patient tolerated well.  EBL < 10 mL  See full dictation in Imaging for details.  Judie Grieve Sapphire Tygart PA-C 09/17/2018 12:56 PM

## 2018-09-17 NOTE — Anesthesia Preprocedure Evaluation (Signed)
Anesthesia Evaluation  Patient identified by MRN, date of birth, ID band Patient awake    Reviewed: Allergy & Precautions, NPO status , Patient's Chart, lab work & pertinent test results  Airway Mallampati: II  TM Distance: >3 FB Neck ROM: Full    Dental no notable dental hx.    Pulmonary asthma , Current Smoker,    Pulmonary exam normal breath sounds clear to auscultation       Cardiovascular hypertension, +CHF  Normal cardiovascular exam Rhythm:Regular Rate:Normal     Neuro/Psych negative neurological ROS  negative psych ROS   GI/Hepatic GERD  ,(+) Hepatitis -, C  Endo/Other  negative endocrine ROS  Renal/GU Renal disease  negative genitourinary   Musculoskeletal negative musculoskeletal ROS (+)   Abdominal   Peds negative pediatric ROS (+)  Hematology  (+) anemia ,   Anesthesia Other Findings   Reproductive/Obstetrics negative OB ROS                             Anesthesia Physical Anesthesia Plan  ASA: IV and emergent  Anesthesia Plan: General   Post-op Pain Management:    Induction: Intravenous and Rapid sequence  PONV Risk Score and Plan: 2 and Treatment may vary due to age or medical condition  Airway Management Planned: Oral ETT  Additional Equipment:   Intra-op Plan:   Post-operative Plan: Extubation in OR  Informed Consent: I have reviewed the patients History and Physical, chart, labs and discussed the procedure including the risks, benefits and alternatives for the proposed anesthesia with the patient or authorized representative who has indicated his/her understanding and acceptance.     Dental advisory given  Plan Discussed with: CRNA and Surgeon  Anesthesia Plan Comments:         Anesthesia Quick Evaluation

## 2018-09-17 NOTE — Progress Notes (Signed)
Pharmacy Antibiotic Note  Karen Morrow is a 58 y.o. female admitted on 09/15/2018 with bacteremia.    Hx of cefazolin allergy in chart but related to TCP, does tolerate other agents  Growing MSSA in the blood. D/w with ID MD. Will d/c vancomycin and continue cefepime as it covers MSSA.  Plan: Cefepime 2 g after HD TTS Vanc d/c'd Monitor HD schedule for further dosing,   Height: 5\' 7"  (170.2 cm) Weight: 123 lb 14.4 oz (56.2 kg) IBW/kg (Calculated) : 61.6  Temp (24hrs), Avg:99.4 F (37.4 C), Min:98.1 F (36.7 C), Max:102.8 F (39.3 C)  Recent Labs  Lab 09/15/18 2153 09/16/18 0252 09/16/18 0906 09/16/18 1907 09/17/18 0247  WBC 8.9 11.4* 12.0* 8.3 6.2  CREATININE 9.73* 9.84*  --   --   --   LATICACIDVEN 1.9 1.1  --   --   --     Estimated Creatinine Clearance: 5.5 mL/min (A) (by C-G formula based on SCr of 9.84 mg/dL (H)).    Allergies  Allergen Reactions  . Cefazolin Other (See Comments)    Severe thrombocytopenia (has tolerated zosyn in the past) ID aware   Kiyaan Haq A. [name], PharmD, [place] Pager: [phone] Please utilize Amion for appropriate phone number to reach the unit pharmacist ([place])    09/17/2018 2:07 PM

## 2018-09-17 NOTE — Progress Notes (Addendum)
[facility] Progress Note   Subjective:  Seen in room. S/p EGD and TDC removal this morning. Denies CP/dyspnea. EGD without active bleeding site, 2 ulceration noted. Hgb down to 6.6 today - 1U PRBCs ordered.  Objective Vitals:   09/17/18 1130 09/17/18 1140 09/17/18 1150 09/17/18 1241  BP: (!) 104/56 119/69 114/70 132/86  Pulse: 86 82 80 88  Resp: (!) 27 (!) 21 (!) 25 (!) 26  Temp: 98.1 F (36.7 C)   99 F (37.2 C)  TempSrc: Oral   Oral  SpO2: 100% 100% 100% 100%  Weight:      Height:       Physical Exam General: Well appearing woman, NAD Heart: RRR; no murmur Lungs: CTAB Abdomen: soft, non-tender Extremities: No LE edema Dialysis Access: LUE AVF + thrill - TDC removed this morning, site bandaged.  Additional Objective Labs: Basic Metabolic Panel: Recent Labs  Lab 09/15/18 2153 09/15/18 2204 09/16/18 0252  NA 135 133* 135  K 4.1 4.0 4.0  CL 99  --  98  CO2 20*  --  21*  GLUCOSE 87  --  94  BUN 65*  --  70*  CREATININE 9.73*  --  9.84*  CALCIUM 9.1  --  9.0  PHOS 2.7  --   --    Liver Function Tests: Recent Labs  Lab 09/15/18 2153 09/16/18 0252  AST 29 28  ALT 20 18  ALKPHOS 64 57  BILITOT 0.8 0.9  PROT 5.5* 5.3*  ALBUMIN 2.6* 2.4*   Recent Labs  Lab 09/15/18 2153  LIPASE 34   CBC: Recent Labs  Lab 09/15/18 2153  09/16/18 0252 09/16/18 0906 09/16/18 1907 09/17/18 0247  WBC 8.9  --  11.4* 12.0* 8.3 6.2  NEUTROABS 7.9*  --   --   --   --   --   HGB 5.3*   < > 4.8* 6.1* 7.5* 6.6*  HCT 16.8*   < > 14.6* 17.3* 22.3* 19.9*  MCV 93.3  --  90.1 84.0 84.5 86.5  PLT 168  --  156 150 144* 122*   < > = values in this interval not displayed.   Blood Culture    Component Value Date/Time   SDES BLOOD RIGHT HAND 09/16/2018 0252   SPECREQUEST  09/16/2018 0252    BOTTLES DRAWN AEROBIC ONLY Blood Culture adequate volume   CULT  09/16/2018 0252    NO GROWTH 1 DAY Performed at [facility], [place] [address] [place], [place]  [address]    REPTSTATUS PENDING 09/16/2018 3825   Studies/Results: Dg Chest Port 1 View  Result Date: 09/15/2018 CLINICAL DATA:  Shortness of breath fever EXAM: PORTABLE CHEST 1 VIEW COMPARISON:  05/13/2018 FINDINGS: Cardiac shadow is mildly enlarged. Dialysis catheter is again seen. The lungs are well aerated without focal infiltrate or sizable effusion. No bony abnormality is noted. IMPRESSION: No acute abnormality noted. Electronically Signed   By: Inez Catalina M.D.   On: 09/15/2018 21:54   Medications: . ceFEPime (MAXIPIME) IV Stopped (09/16/18 1937)   . sodium chloride   Intravenous Once  . amLODipine  10 mg Oral Daily  . calcium acetate  1,334 mg Oral TID WC  . Chlorhexidine Gluconate Cloth  6 each Topical Q0600  . cinacalcet  30 mg Oral Q breakfast  . ferric citrate  210 mg Oral TID WC  . labetalol  200 mg Oral BID  . pantoprazole (PROTONIX) IV  40 mg Intravenous Q12H  . vancomycin  500 mg Intravenous Q T,Th,Sa-HD    Dialysis Orders: TTS at [facility] 3:45hr, 400/800, EDW 55.5kg, 2K/2.5Ca, TDC, no heparin - Hectoral 58mcg IV q HD - No ESA recently, last Hgb 11.9 on 09/07/2018  Assessment/Plan: 1. Symptomatic anemia/ + FOBT: Huge drop in Hgb in < 2 weeks. S/p 2U PRBCs this admit. GI consulted, s/p EGD this morning with 2 non-bleeding ulcers noted. Hgb 6.6 today - for 1 more unit blood. 2. MSSA bacteremia: Vomiting/diarrhea prior to admit - Fever/AMS in ED. CXR clear. COVID negative. BCx drawn 4/17 + MSSA - felt likely due to [facility] - removed 4/19. On Vanc/Cefepime. Repeat BCx 4/18 pending. 3.  ESRD: Continue HD per usual TTS schedule - next on 4/21 using new LUE AVF with 17g needles [facility] has been removed d/t #2). 4.  Hypertension/volume: BP ok, no edema. 5.  Metabolic bone disease: CorrCa borderline high, VDRA on hold - continue sensipar and home binders. 6.  Hep C 7.  GERD  Pollyann Kennedy 09/17/2018, 1:01 PM  [facility] Pager: [phone]  Pt seen, examined  and agree w A/P as above. TDC removed today, should help w/ the staph bacteremia clearing up.  Also EGD this am, results as above.  [facility] 09/17/2018, 7:27 PM

## 2018-09-17 NOTE — Progress Notes (Signed)
Pt had no problems in recovery. PA from IR came to remove dialysis catheter while still in endo. When PA was done removing catheter, pt was then transferred back to floor with no issues.

## 2018-09-17 NOTE — Op Note (Signed)
[facility] Patient Name: Karen Morrow Procedure Date : 09/17/2018 MRN: [id number] Attending MD: Carol Ada , MD Date of Birth: July 02, 1960 CSN: [id number] Age: 58 Admit Type: Inpatient Procedure:                Upper GI endoscopy Indications:              Iron deficiency anemia secondary to chronic blood                            loss, Melena Providers:                Carol Ada, MD, Elmer Ramp. Tilden Dome, RN, Laverda Sorenson, Technician, Dewitt Hoes, CRNA Referring MD:              Medicines:                Propofol per Anesthesia Complications:            No immediate complications. Estimated Blood Loss:     Estimated blood loss: none. Procedure:                Pre-Anesthesia Assessment:                           - Prior to the procedure, a History and Physical                            was performed, and patient medications and                            allergies were reviewed. The patient's tolerance of                            previous anesthesia was also reviewed. The risks                            and benefits of the procedure and the sedation                            options and risks were discussed with the patient.                            All questions were answered, and informed consent                            was obtained. Prior Anticoagulants: The patient has                            taken no previous anticoagulant or antiplatelet                            agents. ASA Grade Assessment: III - A patient with  severe systemic disease. After reviewing the risks                            and benefits, the patient was deemed in                            satisfactory condition to undergo the procedure.                           - Sedation was administered by an anesthesia                            professional. Deep sedation was attained.                           After obtaining informed  consent, the endoscope was                            passed under direct vision. Throughout the                            procedure, the patient's blood pressure, pulse, and                            oxygen saturations were monitored continuously. The                            GIF-H190 ([id number]) Olympus gastroscope was                            introduced through the mouth, and advanced to the                            second part of duodenum. The upper GI endoscopy was                            accomplished without difficulty. The patient                            tolerated the procedure well. Scope In: Scope Out: Findings:      LA Grade A (one or more mucosal breaks less than 5 mm, not extending       between tops of 2 mucosal folds) esophagitis with no bleeding was found       in the lower third of the esophagus.      One non-bleeding cratered gastric ulcer with no stigmata of bleeding was       found in the gastric antrum and in the prepyloric region of the stomach.       The lesion was 3 mm in largest dimension.      A medium non-bleeding diverticulum was found in the second portion of       the duodenum.      A very minimal esophagitis was noted. There was some sloughing of the       distal esophageal mucosa. It is unclear if this was a  contributor to her       bleeding as she was on pantoprazole during this admission. There was no       clear evidence of any bleeding from the large cratered and clean-based       gastric ulcer. This is a chronic ulceration and consistent with the       prior findings in 2015 and 2018. Prior biopsies were negative for H.       pylori. The duodenal diverticulum was at the junction of D1 and D2. Impression:               - LA Grade A reflux esophagitis.                           - Non-bleeding chronic gastric ulcer with no                            stigmata of bleeding.                           - Non-bleeding duodenal diverticulum.                            - No specimens collected. Recommendation:           - Return patient to hospital ward for ongoing care.                           - Renal diet.                           - Continue present medications.                           - PPI indefinitely QD.                           - Dr. Carlean Purl will resume care in the AM. Procedure Code(s):        --- Professional ---                           [phone], Esophagogastroduodenoscopy, flexible,                            transoral; diagnostic, including collection of                            specimen(s) by brushing or washing, when performed                            (separate procedure) Diagnosis Code(s):        --- Professional ---                           K21.0, Gastro-esophageal reflux disease with                            esophagitis  K25.9, Gastric ulcer, unspecified as acute or                            chronic, without hemorrhage or perforation                           D50.0, Iron deficiency anemia secondary to blood                            loss (chronic)                           K92.1, Melena (includes Hematochezia)                           K57.10, Diverticulosis of small intestine without                            perforation or abscess without bleeding CPT copyright 2019 [employer]. All rights reserved. The codes documented in this report are preliminary and upon coder review may  be revised to meet current compliance requirements. Carol Ada, MD Carol Ada, MD 09/17/2018 11:44:52 AM This report has been signed electronically. Number of Addenda: 0

## 2018-09-17 NOTE — Anesthesia Procedure Notes (Signed)
Date/Time: 09/17/2018 11:11 AM Performed by: Trinna Post., CRNA Pre-anesthesia Checklist: Patient identified, Emergency Drugs available, Suction available, Patient being monitored and Timeout performed Patient Re-evaluated:Patient Re-evaluated prior to induction Oxygen Delivery Method: Nasal cannula Preoxygenation: Pre-oxygenation with 100% oxygen Induction Type: IV induction Placement Confirmation: positive ETCO2

## 2018-09-17 NOTE — Progress Notes (Signed)
   09/17/18 1500  Clinical Encounter Type  Visited With Patient  Visit Type Initial;Spiritual support  Referral From Nurse  Consult/Referral To Chaplain  Spiritual Encounters  Spiritual Needs Emotional;Prayer  Stress Factors  Patient Stress Factors Health changes   Responded to spiritual care consult. PT was alert and talkative. PT stated she feels so much better from previous days in the Hospital. Also, she stated that she is looking forward to going home in a few days to be with her husband. She was very thankful for the [facility] visit. Next, she stated that the staff care we good. I offered spiritual care with words of comfort, ministry of presence, a listening ear, and prayer. Chaplain available as needed. Chaplain Fidel Levy  [phone]

## 2018-09-17 NOTE — Anesthesia Postprocedure Evaluation (Signed)
Anesthesia Post Note  Patient: Karen Morrow  Procedure(s) Performed: ESOPHAGOGASTRODUODENOSCOPY (EGD) WITH PROPOFOL (N/A )     Patient location during evaluation: PACU Anesthesia Type: MAC Level of consciousness: awake and alert Pain management: pain level controlled Vital Signs Assessment: post-procedure vital signs reviewed and stable Respiratory status: spontaneous breathing, nonlabored ventilation, respiratory function stable and patient connected to nasal cannula oxygen Cardiovascular status: stable and blood pressure returned to baseline Postop Assessment: no apparent nausea or vomiting Anesthetic complications: no    Last Vitals:  Vitals:   09/17/18 1130 09/17/18 1140  BP: (!) 104/56 119/69  Pulse: 86 82  Resp: (!) 27 (!) 21  Temp: 36.7 C   SpO2: 100% 100%    Last Pain:  Vitals:   09/17/18 1140  TempSrc:   PainSc: 0-No pain                 Sayvon Arterberry S

## 2018-09-17 NOTE — Progress Notes (Signed)
PROGRESS NOTE   Karen Morrow  FUX:[id number]    DOB: [birth year]    DOA: 09/15/2018  PCP: [name], MD   I have briefly reviewed patients previous medical records in [facility].  Brief Narrative:  58 year old female with PMH of ESRD on TTS HD, HTN, GERD, gastric ulcer, hepatitis C, asthma, chronic CHF, presented to [facility] ED via EMS on 09/15/2018 due to high fever, nonproductive cough, dyspnea and AMS which reportedly started on day of arrival.  Patient is a poor historian.  Met sepsis criteria in the ED without clear source.  COVID testing negative.  Empirically started on IV vancomycin and cefepime.  Sepsis improved.  Hemoglobin down to 4.8 which was in 9 g range in early April per nephrology along with unclear history of melena and blood in stool.  Transfusing for anemia.  GI consulted and s/p EGD 4/19.  Nephrology consulted for dialysis needs.   Assessment & Plan:   Principal Problem:   Sepsis ([place]) Active Problems:   Gastric ulcer   HTN (hypertension)   ESRD (end stage renal disease) (HCC)   Gastroesophageal reflux disease   Anemia in chronic kidney disease, on chronic dialysis (HCC)   Acute GI bleeding   Symptomatic anemia   Severe systemic inflammatory response syndrome (SIRS) (HCC)   Altered mental status   Tobacco abuse   1. MSSA bacteremia and sepsis: Likely due to [facility].  Met sepsis criteria on admission.  Mild leukocytosis.  Lactate normal.  Procalcitonin 0.85.  RVP and SARS coronavirus 2 testing negative.  Blood cultures x2 from 4/17: Show staph aureus- sensitivity pending.  Blood culture 4/18: Negative to date.  BCID 4/17 shows MSSA positive.  Chest x-ray without acute findings.  Patient has left TDC in place and an aVF which is ready to use.  She likely has bacteremia related to TDC-removed by IR on 4/19.  Empirically started on IV vancomycin and cefepime, as per ID recommendations cefepime continued and vancomycin discontinued pending sensitivities.  Sepsis  improved.  Formal ID input pending. 2. Symptomatic anemia: Hemoglobin in the 9 range in early April per nephrology.  Down to 5.4 on admission >4.8 on 4/18.  After 2 units of PRBC, hemoglobin had improved from 4.8-7.5 last night but dropped again this morning to 6.6.  Transfusing third unit PRBC.  Follow posttransfusion CBC and aim to keep hemoglobin >7.  Gives unclear history of melena and rectal bleed, could be due to GI bleeding from known chronic gastric ulcer.  GI consulted and s/p EGD 4/19 with results as below. 3. Suspected subacute upper GI bleed/gastric ulcer/GERD: Reviewed prior records and has had EGD last one in 2018 which showed gastric ulcer.  IV PPI started.  GI consulted and underwent EGD with results as below, reflux esophagitis, nonbleeding chronic gastric ulcer with no stigmata of bleeding.  Unclear reason why she had such substantial drop in her hemoglobin.?  Need for further work-up i.e. bleeding scan/colonoscopy.  Will discuss with GI in a.m. 4. ESRD on TTS HD: Nephrology consulted.  Missed last HD.  Status post HD 4/18.  Next HD on 4/21.  Plan to use new LUE AVF. 5. Essential hypertension: Controlled.  As per pharmacy update of home medications, patient is on metoprolol and not labetalol and Auryxia not PhosLo.  Continue metoprolol. 6. Acute encephalopathy: Baseline mental status not known.  Presenting symptoms may have been related to sepsis and anemia.  Mental status may be back to baseline.  Monitor closely.  No  focal deficits. 7. Tobacco abuse: Cessation counseled. 8. Hepatitis C: Outpatient follow-up. 9. Metabolic bone disease: Management per nephrology. 10. Thrombocytopenia: Could be related to sepsis and blood transfusion.  Follow CBC closely.  DVT prophylaxis: SCDs Code Status: Full Family Communication: None at bedside. Disposition: DC home pending clinical improvement   Consultants:  Nephrology GI/Dr. Benson [place] IR  Procedures:  EGD 4/19:  Impression: - LA Grade A  reflux esophagitis. - Non-bleeding chronic gastric ulcer with no stigmata of bleeding. - Non-bleeding duodenal diverticulum. - No specimens collected.  Recommendation: PPI once daily indefinitely.  Antimicrobials:  IV cefepime Vancomycin discontinued   Subjective: Patient seen this afternoon after EGD.  Anxious to go home.  Denies complaints.  No BM or bleeding reported.  Denies dizziness, lightheadedness, chest pain, dyspnea or palpitations.  ROS: As above, otherwise negative.  Objective:  Vitals:   09/17/18 1150 09/17/18 1241 09/17/18 1334 09/17/18 1349  BP: 114/70 132/86 129/83 129/77  Pulse: 80 88 88 91  Resp: (!) 25 (!) 26 (!) 23 16  Temp:  99 F (37.2 C) 99.3 F (37.4 C) 98.6 F (37 C)  TempSrc:  Oral Oral Oral  SpO2: 100% 100% 100% 100%  Weight:      Height:        Examination:  General exam: Middle-aged female, moderately built and nourished lying comfortably propped up in bed undergoing blood transfusion.  Pale mucosa. Respiratory system: Clear to auscultation. Respiratory effort normal.  Left anterior chest tunneled HD catheter has been removed. Cardiovascular system: S1 & S2 heard, RRR. No JVD, murmurs, rubs, gallops or clicks. No pedal edema.  Telemetry personally reviewed: Sinus rhythm. Gastrointestinal system: Abdomen is nondistended, soft and nontender. No organomegaly or masses felt. Normal bowel sounds heard.  Stable Central nervous system: Alert and oriented. No focal neurological deficits.  Stable Extremities: Symmetric 5 x 5 power. Skin: No rashes, lesions or ulcers Psychiatry: Judgement and insight impaired. Mood & affect appropriate.     Data Reviewed: I have personally reviewed following labs and imaging studies  CBC: Recent Labs  Lab 09/15/18 2153 09/15/18 2204 09/16/18 0252 09/16/18 0906 09/16/18 1907 09/17/18 0247  WBC 8.9  --  11.4* 12.0* 8.3 6.2  NEUTROABS 7.9*  --   --   --   --   --   HGB 5.3* 5.4* 4.8* 6.1* 7.5* 6.6*  HCT  16.8* 16.0* 14.6* 17.3* 22.3* 19.9*  MCV 93.3  --  90.1 84.0 84.5 86.5  PLT 168  --  156 150 144* 474*   Basic Metabolic Panel: Recent Labs  Lab 09/15/18 2153 09/15/18 2204 09/16/18 0252  NA 135 133* 135  K 4.1 4.0 4.0  CL 99  --  98  CO2 20*  --  21*  GLUCOSE 87  --  94  BUN 65*  --  70*  CREATININE 9.73*  --  9.84*  CALCIUM 9.1  --  9.0  MG 1.8  --   --   PHOS 2.7  --   --    Liver Function Tests: Recent Labs  Lab 09/15/18 2153 09/16/18 0252  AST 29 28  ALT 20 18  ALKPHOS 64 57  BILITOT 0.8 0.9  PROT 5.5* 5.3*  ALBUMIN 2.6* 2.4*   Coagulation Profile: Recent Labs  Lab 09/15/18 2153  INR 1.0     Recent Results (from the past 240 hour(s))  SARS Coronavirus 2 [facility] order, Performed in [place] hospital lab)     Status: None   Collection Time:  09/15/18  9:12 PM  Result Value Ref Range Status   SARS Coronavirus 2 NEGATIVE NEGATIVE Final    Comment: (NOTE) If result is NEGATIVE SARS-CoV-2 target nucleic acids are NOT DETECTED. The SARS-CoV-2 RNA is generally detectable in upper and lower  respiratory specimens during the acute phase of infection. The lowest  concentration of SARS-CoV-2 viral copies this assay can detect is 250  copies / mL. A negative result does not preclude SARS-CoV-2 infection  and should not be used as the sole basis for treatment or other  patient management decisions.  A negative result may occur with  improper specimen collection / handling, submission of specimen other  than nasopharyngeal swab, presence of viral mutation(s) within the  areas targeted by this assay, and inadequate number of viral copies  (<250 copies / mL). A negative result must be combined with clinical  observations, patient history, and epidemiological information. If result is POSITIVE SARS-CoV-2 target nucleic acids are DETECTED. The SARS-CoV-2 RNA is generally detectable in upper and lower  respiratory specimens dur ing the acute phase of infection.   Positive  results are indicative of active infection with SARS-CoV-2.  Clinical  correlation with patient history and other diagnostic information is  necessary to determine patient infection status.  Positive results do  not rule out bacterial infection or co-infection with other viruses. If result is PRESUMPTIVE POSTIVE SARS-CoV-2 nucleic acids MAY BE PRESENT.   A presumptive positive result was obtained on the submitted specimen  and confirmed on repeat testing.  While 2019 novel coronavirus  (SARS-CoV-2) nucleic acids may be present in the submitted sample  additional confirmatory testing may be necessary for epidemiological  and / or clinical management purposes  to differentiate between  SARS-CoV-2 and other Sarbecovirus currently known to infect humans.  If clinically indicated additional testing with an alternate test  methodology [phone]) is advised. The SARS-CoV-2 RNA is generally  detectable in upper and lower respiratory sp ecimens during the acute  phase of infection. The expected result is Negative. Fact Sheet for Patients:  StrictlyIdeas.no Fact Sheet for Healthcare Providers: BankingDealers.co.za This test is not yet approved or cleared by the [place] FDA and has been authorized for detection and/or diagnosis of SARS-CoV-2 by FDA under an Emergency Use Authorization (EUA).  This EUA will remain in effect (meaning this test can be used) for the duration of the COVID-19 declaration under Section 564(b)(1) of the Act, 21 U.S.C. section 360bbb-3(b)(1), unless the authorization is terminated or revoked sooner. Performed at [facility], [place] [address] [place], [place] [address]   Respiratory Panel by PCR     Status: None   Collection Time: 09/15/18  9:12 PM  Result Value Ref Range Status   Adenovirus NOT DETECTED NOT DETECTED Final   Coronavirus 229E NOT DETECTED NOT DETECTED Final    Comment: (NOTE) The  Coronavirus on the Respiratory Panel, DOES NOT test for the novel  Coronavirus (2019 nCoV)    Coronavirus HKU1 NOT DETECTED NOT DETECTED Final   Coronavirus NL63 NOT DETECTED NOT DETECTED Final   Coronavirus OC43 NOT DETECTED NOT DETECTED Final   Metapneumovirus NOT DETECTED NOT DETECTED Final   Rhinovirus / Enterovirus NOT DETECTED NOT DETECTED Final   Influenza A NOT DETECTED NOT DETECTED Final   Influenza B NOT DETECTED NOT DETECTED Final   Parainfluenza Virus 1 NOT DETECTED NOT DETECTED Final   Parainfluenza Virus 2 NOT DETECTED NOT DETECTED Final   Parainfluenza Virus 3 NOT DETECTED NOT DETECTED  Final   Parainfluenza Virus 4 NOT DETECTED NOT DETECTED Final   Respiratory Syncytial Virus NOT DETECTED NOT DETECTED Final   Bordetella pertussis NOT DETECTED NOT DETECTED Final   Chlamydophila pneumoniae NOT DETECTED NOT DETECTED Final   Mycoplasma pneumoniae NOT DETECTED NOT DETECTED Final    Comment: Performed at [facility], [place] [address] [place], [place] [address]  Blood Culture (routine x 2)     Status: Abnormal (Preliminary result)   Collection Time: 09/15/18  9:30 PM  Result Value Ref Range Status   Specimen Description BLOOD RIGHT HAND  Final   Special Requests   Final    BOTTLES DRAWN AEROBIC AND ANAEROBIC Blood Culture adequate volume   Culture  Setup Time   Final    GRAM POSITIVE COCCI IN CLUSTERS IN BOTH AEROBIC AND ANAEROBIC BOTTLES CRITICAL VALUE NOTED.  VALUE IS CONSISTENT WITH PREVIOUSLY REPORTED AND CALLED VALUE. Performed at [facility], [place] [address], [place], [place] [address]    Culture STAPHYLOCOCCUS AUREUS (A)  Final   Report Status PENDING  Incomplete  Blood Culture (routine x 2)     Status: Abnormal (Preliminary result)   Collection Time: 09/15/18  9:38 PM  Result Value Ref Range Status   Specimen Description BLOOD LEFT LOWER ARM  Final   Special Requests   Final    BOTTLES DRAWN AEROBIC AND ANAEROBIC Blood Culture adequate volume    Culture  Setup Time   Final    GRAM POSITIVE COCCI IN CLUSTERS IN BOTH AEROBIC AND ANAEROBIC BOTTLES CRITICAL RESULT CALLED TO, READ BACK BY AND VERIFIED WITH: T. DANG, PHARMD AT 1220 ON 09/16/18 BY C. JESSUP, MLT. Performed at [facility], [place] [address], [place], [place] [address]    Culture STAPHYLOCOCCUS AUREUS (A)  Final   Report Status PENDING  Incomplete  Blood Culture ID Panel (Reflexed)     Status: Abnormal   Collection Time: 09/15/18  9:38 PM  Result Value Ref Range Status   Enterococcus species NOT DETECTED NOT DETECTED Final   Listeria monocytogenes NOT DETECTED NOT DETECTED Final   Staphylococcus species DETECTED (A) NOT DETECTED Final    Comment: CRITICAL RESULT CALLED TO, READ BACK BY AND VERIFIED WITH: T. DANG, PHARMD AT 1220 ON 09/16/18 BY C. JESSUP, MLT.    Staphylococcus aureus (BCID) DETECTED (A) NOT DETECTED Final    Comment: Methicillin (oxacillin) susceptible Staphylococcus aureus (MSSA). Preferred therapy is anti staphylococcal beta lactam antibiotic (Cefazolin or Nafcillin), unless clinically contraindicated. CRITICAL RESULT CALLED TO, READ BACK BY AND VERIFIED WITH: T. DANG, PHARMD AT 1220 ON 09/16/18 BY C. JESSUP, MLT.    Methicillin resistance NOT DETECTED NOT DETECTED Final   Streptococcus species NOT DETECTED NOT DETECTED Final   Streptococcus agalactiae NOT DETECTED NOT DETECTED Final   Streptococcus pneumoniae NOT DETECTED NOT DETECTED Final   Streptococcus pyogenes NOT DETECTED NOT DETECTED Final   Acinetobacter baumannii NOT DETECTED NOT DETECTED Final   Enterobacteriaceae species NOT DETECTED NOT DETECTED Final   Enterobacter cloacae complex NOT DETECTED NOT DETECTED Final   Escherichia coli NOT DETECTED NOT DETECTED Final   Klebsiella oxytoca NOT DETECTED NOT DETECTED Final   Klebsiella pneumoniae NOT DETECTED NOT DETECTED Final   Proteus species NOT DETECTED NOT DETECTED Final   Serratia marcescens NOT DETECTED NOT DETECTED Final    Haemophilus influenzae NOT DETECTED NOT DETECTED Final   Neisseria meningitidis NOT DETECTED NOT DETECTED Final   Pseudomonas aeruginosa NOT DETECTED NOT DETECTED Final  Candida albicans NOT DETECTED NOT DETECTED Final   Candida glabrata NOT DETECTED NOT DETECTED Final   Candida krusei NOT DETECTED NOT DETECTED Final   Candida parapsilosis NOT DETECTED NOT DETECTED Final   Candida tropicalis NOT DETECTED NOT DETECTED Final    Comment: Performed at [facility], [place] [address], [place], [place] [address]  Culture, blood (single)     Status: None (Preliminary result)   Collection Time: 09/16/18  2:52 AM  Result Value Ref Range Status   Specimen Description BLOOD RIGHT HAND  Final   Special Requests   Final    BOTTLES DRAWN AEROBIC ONLY Blood Culture adequate volume   Culture   Final    NO GROWTH 1 DAY Performed at [facility], [place] [address] [place], [place] [address]    Report Status PENDING  Incomplete         Radiology Studies: Ir Removal Tun Cv Cath W/o Fl  Result Date: 09/17/2018 INDICATION: Sepsis. End-stage renal failure on hemodialysis. Request for removal of tunneled hemodialysis catheter. Per patient was initially placed 4 years ago and she does not know which facility. EXAM: REMOVAL OF TUNNELED HEMODIALYSIS CATHETER MEDICATIONS: 1% lidocaine 15 mL ANESTHESIA/SEDATION: No sedation medication given. COMPLICATIONS: None immediate. PROCEDURE: Informed written consent was obtained from the patient following an explanation of the procedure, risks, benefits and alternatives to treatment. A time out was performed prior to the initiation of the procedure. Maximal barrier sterile technique was utilized including mask, sterile gloves, sterile drape, hand hygiene, and ChloraPrep. 1% lidocaine was injected along the tract and around the cuff. Attempt was made to remove the tunneled catheter with gentle traction and dissection however the catheter was extremely scarred down.  The cuff was palpated about 3 inches above the exit site of the catheter. Using a 11. Blade a small incision was made over the cuff. Using scissors and hemostatic cuff was dissected out the catheter was removed. Next 2 interrupted 3-0 Ethilon sutures were used to approximate the skin at the incision site and 1 interrupted suture was used to approximate the incision made just above the exit site taking care to make sure the actual catheter exit site remained open to allow for drainage and prevent abscess formation. Dry gauze and a Tegaderm was placed over both sites. IMPRESSION: Successful removal of tunneled dialysis catheter. Read by: Gareth Eagle, PA-C Electronically Signed   By: Jacqulynn Cadet M.D.   On: 09/17/2018 12:54   Dg Chest Port 1 View  Result Date: 09/15/2018 CLINICAL DATA:  Shortness of breath fever EXAM: PORTABLE CHEST 1 VIEW COMPARISON:  05/13/2018 FINDINGS: Cardiac shadow is mildly enlarged. Dialysis catheter is again seen. The lungs are well aerated without focal infiltrate or sizable effusion. No bony abnormality is noted. IMPRESSION: No acute abnormality noted. Electronically Signed   By: Inez Catalina M.D.   On: 09/15/2018 21:54        Scheduled Meds: . sodium chloride   Intravenous Once  . amLODipine  10 mg Oral Daily  . calcium acetate  1,334 mg Oral TID WC  . Chlorhexidine Gluconate Cloth  6 each Topical Q0600  . cinacalcet  30 mg Oral Q breakfast  . ferric citrate  210 mg Oral TID WC  . labetalol  200 mg Oral BID  . pantoprazole (PROTONIX) IV  40 mg Intravenous Q12H   Continuous Infusions: . [START ON 09/19/2018] ceFEPime (MAXIPIME) IV       LOS: 2 days  Vernell Leep, MD, FACP, [facility]. [facility]  To contact the attending provider between 7A-7P or the covering provider during after hours 7P-7A, please log into the web site [email or web address] and access using universal [place] password for that web site. If you do not have the password, please call the  hospital operator.  09/17/2018, 4:46 PM

## 2018-09-18 DIAGNOSIS — Z8619 Personal history of other infectious and parasitic diseases: Secondary | ICD-10-CM

## 2018-09-18 DIAGNOSIS — N186 End stage renal disease: Secondary | ICD-10-CM

## 2018-09-18 DIAGNOSIS — Z881 Allergy status to other antibiotic agents status: Secondary | ICD-10-CM

## 2018-09-18 DIAGNOSIS — I770 Arteriovenous fistula, acquired: Secondary | ICD-10-CM

## 2018-09-18 DIAGNOSIS — A4101 Sepsis due to Methicillin susceptible Staphylococcus aureus: Secondary | ICD-10-CM

## 2018-09-18 LAB — TYPE AND SCREEN
ABO/RH(D): O POS
Antibody Screen: NEGATIVE
Unit division: 0
Unit division: 0
Unit division: 0

## 2018-09-18 LAB — CBC
HCT: 25.9 % — ABNORMAL LOW (ref 36.0–46.0)
Hemoglobin: 8.9 g/dL — ABNORMAL LOW (ref 12.0–15.0)
MCH: 30.1 pg (ref 26.0–34.0)
MCHC: 34.4 g/dL (ref 30.0–36.0)
MCV: 87.5 fL (ref 80.0–100.0)
Platelets: 120 10*3/uL — ABNORMAL LOW (ref 150–400)
RBC: 2.96 MIL/uL — ABNORMAL LOW (ref 3.87–5.11)
RDW: 16.4 % — ABNORMAL HIGH (ref 11.5–15.5)
WBC: 6.1 10*3/uL (ref 4.0–10.5)
nRBC: 0 % (ref 0.0–0.2)

## 2018-09-18 LAB — BPAM RBC
Blood Product Expiration Date: 202004232359
Blood Product Expiration Date: 202004302359
Blood Product Expiration Date: 202005012359
ISSUE DATE / TIME: 202004180319
ISSUE DATE / TIME: 202004181251
ISSUE DATE / TIME: 202004191331
Unit Type and Rh: 5100
Unit Type and Rh: 5100
Unit Type and Rh: 5100

## 2018-09-18 LAB — CULTURE, BLOOD (ROUTINE X 2)
Special Requests: ADEQUATE
Special Requests: ADEQUATE

## 2018-09-18 MED ORDER — PANTOPRAZOLE SODIUM 40 MG PO TBEC
40.0000 mg | DELAYED_RELEASE_TABLET | Freq: Two times a day (BID) | ORAL | Status: DC
  Administered 2018-09-18 – 2018-09-19 (×2): 40 mg via ORAL
  Filled 2018-09-18 (×2): qty 1

## 2018-09-18 MED ORDER — PRO-STAT SUGAR FREE PO LIQD
30.0000 mL | Freq: Two times a day (BID) | ORAL | Status: DC
  Administered 2018-09-18 – 2018-09-19 (×3): 30 mL via ORAL
  Filled 2018-09-18 (×3): qty 30

## 2018-09-18 NOTE — Progress Notes (Signed)
  [facility]   Reason for visit: Follow up on MSSA sepsis  Interval History: permacath removed late yesterday; echo showed no evidence of endocarditis; appetite and ambulation improving; now awaiting HD tomorrow via Lt arm AVF  fever curve, labs, and above studies all independently reviewed   Physical Exam: Vitals:   09/18/18 0458 09/18/18 1353  BP: (!) 161/97 (!) 146/93  Pulse: 78 80  Resp: 18 16  Temp: 98.7 F (37.1 C) 98.6 F (37 C)  SpO2: 100% 99%  Physical Exam Gen: pleasant, NAD, A&Ox 3 Head: NCAT, no temporal wasting evident EENT: PERRL, EOMI, MMM, adequate dentition Neck: supple, no JVD CV: NRRR, no murmurs evident Pulm: CTA bilaterally, no wheeze or retractions Abd: soft, NTND, +BS Extrems: no LE edema, 2+ pulses, LT arm AVF with good bruit and audible thrill Skin: no rashes, adequate skin turgor Neuro: CN II-XII grossly intact, no focal neurologic deficits appreciated, gait was not assessed, A&Ox 3  Review of Systems: No fever or chills, denies N/V, no SOB, all other systems were reviewed and negative except as noted above.  Lab Results  Component Value Date   WBC 6.1 09/18/2018   HGB 8.9 (L) 09/18/2018   HCT 25.9 (L) 09/18/2018   MCV 87.5 09/18/2018   PLT 120 (L) 09/18/2018    Lab Results  Component Value Date   CREATININE 9.84 (H) 09/16/2018   BUN 70 (H) 09/16/2018   NA 135 09/16/2018   K 4.0 09/16/2018   CL 98 09/16/2018   CO2 21 (L) 09/16/2018    Lab Results  Component Value Date   ALT 18 09/16/2018   AST 28 09/16/2018   ALKPHOS 57 09/16/2018     Microbiology: Recent Results (from the past 240 hour(s))  SARS Coronavirus 2 (Hospital order, Performed in [facility] lab)     Status: None   Collection Time: 09/15/18  9:12 PM  Result Value Ref Range Status   SARS Coronavirus 2 NEGATIVE NEGATIVE Final    Comment: (NOTE) If result is NEGATIVE SARS-CoV-2 target nucleic acids are NOT DETECTED. The SARS-CoV-2  RNA is generally detectable in upper and lower  respiratory specimens during the acute phase of infection. The lowest  concentration of SARS-CoV-2 viral copies this assay can detect is 250  copies / mL. A negative result does not preclude SARS-CoV-2 infection  and should not be used as the sole basis for treatment or other  patient management decisions.  A negative result may occur with  improper specimen collection / handling, submission of specimen other  than nasopharyngeal swab, presence of viral mutation(s) within the  areas targeted by this assay, and inadequate number of viral copies  (<250 copies / mL). A negative result must be combined with clinical  observations, patient history, and epidemiological information. If result is POSITIVE SARS-CoV-2 target nucleic acids are DETECTED. The SARS-CoV-2 RNA is generally detectable in upper and lower  respiratory specimens dur ing the acute phase of infection.  Positive  results are indicative of active infection with SARS-CoV-2.  Clinical  correlation with patient history and other diagnostic information is  necessary to determine patient infection status.  Positive results do  not rule out bacterial infection or co-infection with other viruses. If result is PRESUMPTIVE POSTIVE SARS-CoV-2 nucleic acids MAY BE PRESENT.   A presumptive positive result was obtained on the submitted specimen  and confirmed on repeat testing.  While 2019 novel coronavirus  (SARS-CoV-2) nucleic acids may be present in   the submitted sample  additional confirmatory testing may be necessary for epidemiological  and / or clinical management purposes  to differentiate between  SARS-CoV-2 and other Sarbecovirus currently known to infect humans.  If clinically indicated additional testing with an alternate test  methodology [phone]) is advised. The SARS-CoV-2 RNA is generally  detectable in upper and lower respiratory sp ecimens during the acute  phase of  infection. The expected result is Negative. Fact Sheet for Patients:  StrictlyIdeas.no Fact Sheet for Healthcare Providers: BankingDealers.co.za This test is not yet approved or cleared by the [place] FDA and has been authorized for detection and/or diagnosis of SARS-CoV-2 by FDA under an Emergency Use Authorization (EUA).  This EUA will remain in effect (meaning this test can be used) for the duration of the COVID-19 declaration under Section 564(b)(1) of the Act, 21 U.S.C. section 360bbb-3(b)(1), unless the authorization is terminated or revoked sooner. Performed at [facility], [place] [address], [place], [place] [address]   Respiratory Panel by PCR     Status: None   Collection Time: 09/15/18  9:12 PM  Result Value Ref Range Status   Adenovirus NOT DETECTED NOT DETECTED Final   Coronavirus 229E NOT DETECTED NOT DETECTED Final    Comment: (NOTE) The Coronavirus on the Respiratory Panel, DOES NOT test for the novel  Coronavirus (2019 nCoV)    Coronavirus HKU1 NOT DETECTED NOT DETECTED Final   Coronavirus NL63 NOT DETECTED NOT DETECTED Final   Coronavirus OC43 NOT DETECTED NOT DETECTED Final   Metapneumovirus NOT DETECTED NOT DETECTED Final   Rhinovirus / Enterovirus NOT DETECTED NOT DETECTED Final   Influenza A NOT DETECTED NOT DETECTED Final   Influenza B NOT DETECTED NOT DETECTED Final   Parainfluenza Virus 1 NOT DETECTED NOT DETECTED Final   Parainfluenza Virus 2 NOT DETECTED NOT DETECTED Final   Parainfluenza Virus 3 NOT DETECTED NOT DETECTED Final   Parainfluenza Virus 4 NOT DETECTED NOT DETECTED Final   Respiratory Syncytial Virus NOT DETECTED NOT DETECTED Final   Bordetella pertussis NOT DETECTED NOT DETECTED Final   Chlamydophila pneumoniae NOT DETECTED NOT DETECTED Final   Mycoplasma pneumoniae NOT DETECTED NOT DETECTED Final    Comment: Performed at [facility], [place]. [address], [place], [place] [address]   Blood Culture (routine x 2)     Status: Abnormal   Collection Time: 09/15/18  9:30 PM  Result Value Ref Range Status   Specimen Description BLOOD RIGHT HAND  Final   Special Requests   Final    BOTTLES DRAWN AEROBIC AND ANAEROBIC Blood Culture adequate volume   Culture  Setup Time   Final    GRAM POSITIVE COCCI IN CLUSTERS IN BOTH AEROBIC AND ANAEROBIC BOTTLES CRITICAL VALUE NOTED.  VALUE IS CONSISTENT WITH PREVIOUSLY REPORTED AND CALLED VALUE.    Culture (A)  Final    STAPHYLOCOCCUS AUREUS SUSCEPTIBILITIES PERFORMED ON PREVIOUS CULTURE WITHIN THE LAST 5 DAYS. Performed at [facility], [place] [address], [place], [place] [address]    Report Status 09/18/2018 FINAL  Final  Blood Culture (routine x 2)     Status: Abnormal   Collection Time: 09/15/18  9:38 PM  Result Value Ref Range Status   Specimen Description BLOOD LEFT LOWER ARM  Final   Special Requests   Final    BOTTLES DRAWN AEROBIC AND ANAEROBIC Blood Culture adequate volume   Culture  Setup Time   Final    GRAM POSITIVE COCCI IN CLUSTERS IN BOTH  AEROBIC AND ANAEROBIC BOTTLES CRITICAL RESULT CALLED TO, READ BACK BY AND VERIFIED WITH: T. DANG, PHARMD AT 1220 ON 09/16/18 BY C. JESSUP, MLT. Performed at [facility], [address] [place], [place] [address]    Culture STAPHYLOCOCCUS AUREUS (A)  Final   Report Status 09/18/2018 FINAL  Final   Organism ID, Bacteria STAPHYLOCOCCUS AUREUS  Final      Susceptibility   Staphylococcus aureus - MIC*    CIPROFLOXACIN 1 SENSITIVE Sensitive     ERYTHROMYCIN 0.5 SENSITIVE Sensitive     GENTAMICIN <=0.5 SENSITIVE Sensitive     OXACILLIN 0.5 SENSITIVE Sensitive     TETRACYCLINE <=1 SENSITIVE Sensitive     VANCOMYCIN 1 SENSITIVE Sensitive     TRIMETH/SULFA <=10 SENSITIVE Sensitive     CLINDAMYCIN <=0.25 SENSITIVE Sensitive     RIFAMPIN <=0.5 SENSITIVE Sensitive     Inducible Clindamycin NEGATIVE Sensitive     * STAPHYLOCOCCUS AUREUS  Blood Culture ID Panel (Reflexed)      Status: Abnormal   Collection Time: 09/15/18  9:38 PM  Result Value Ref Range Status   Enterococcus species NOT DETECTED NOT DETECTED Final   Listeria monocytogenes NOT DETECTED NOT DETECTED Final   Staphylococcus species DETECTED (A) NOT DETECTED Final    Comment: CRITICAL RESULT CALLED TO, READ BACK BY AND VERIFIED WITH: T. DANG, PHARMD AT 1220 ON 09/16/18 BY C. JESSUP, MLT.    Staphylococcus aureus (BCID) DETECTED (A) NOT DETECTED Final    Comment: Methicillin (oxacillin) susceptible Staphylococcus aureus (MSSA). Preferred therapy is anti staphylococcal beta lactam antibiotic (Cefazolin or Nafcillin), unless clinically contraindicated. CRITICAL RESULT CALLED TO, READ BACK BY AND VERIFIED WITH: T. DANG, PHARMD AT 1220 ON 09/16/18 BY C. JESSUP, MLT.    Methicillin resistance NOT DETECTED NOT DETECTED Final   Streptococcus species NOT DETECTED NOT DETECTED Final   Streptococcus agalactiae NOT DETECTED NOT DETECTED Final   Streptococcus pneumoniae NOT DETECTED NOT DETECTED Final   Streptococcus pyogenes NOT DETECTED NOT DETECTED Final   Acinetobacter baumannii NOT DETECTED NOT DETECTED Final   Enterobacteriaceae species NOT DETECTED NOT DETECTED Final   Enterobacter cloacae complex NOT DETECTED NOT DETECTED Final   Escherichia coli NOT DETECTED NOT DETECTED Final   Klebsiella oxytoca NOT DETECTED NOT DETECTED Final   Klebsiella pneumoniae NOT DETECTED NOT DETECTED Final   Proteus species NOT DETECTED NOT DETECTED Final   Serratia marcescens NOT DETECTED NOT DETECTED Final   Haemophilus influenzae NOT DETECTED NOT DETECTED Final   Neisseria meningitidis NOT DETECTED NOT DETECTED Final   Pseudomonas aeruginosa NOT DETECTED NOT DETECTED Final   Candida albicans NOT DETECTED NOT DETECTED Final   Candida glabrata NOT DETECTED NOT DETECTED Final   Candida krusei NOT DETECTED NOT DETECTED Final   Candida parapsilosis NOT DETECTED NOT DETECTED Final   Candida tropicalis NOT DETECTED NOT  DETECTED Final    Comment: Performed at [facility], [address] [place], [place] [address]  Culture, blood (single)     Status: None (Preliminary result)   Collection Time: 09/16/18  2:52 AM  Result Value Ref Range Status   Specimen Description BLOOD RIGHT HAND  Final   Special Requests   Final    BOTTLES DRAWN AEROBIC ONLY Blood Culture adequate volume   Culture   Final    NO GROWTH 2 DAYS Performed at [facility], [address] , [place] [address]    Report Status PENDING  Incomplete    Impression/Plan: The patient is   a 57 y/o AAF with ESRD & HCV admitted with fever, leukocytosis, AMS, and subsequently found to have MSSA sepsis.  1. MSSA sepsis - Pt reports she has had her current LT IJ permacath for ~3 years. External components to device w/o gross evidence of infection, but given pathogen isolated on blood, I agree with removing, especially as she has an alternate HD access point in the form of her LT AVF. Repeat blood cxs from 09/16/2018 are remaining negative to date. TTE showed no evidence of endocarditis, so plan to limit ABX duration to 2 weeks from the time of permacath explantation. Tentative d/c date for cefepime is 10/02/2018. Note: Due to the patient's reported thrombocytopenia in 2015 with cefazolin, will use cefepime instead as she has tolerated this well here. Dosing will be 2 gm IV after each HD session on [facility]Sa.  2. Fever/leukocytosis - Fever curve continues to improve with ongoing ABX. Leukocytosis has now resolved.  3. ESRD - agree with plan to now dialyze pt via her LT AVF following explantation of her permacath. Discussed need for adherence to renal diet and phosphate binders to reduce scratching. Emphasized hand hygeine heavily with the patient. Now that her access is no longer externalized, this should natively reduce her risk for further episodes of sepsis.  4. HCV - HIV Ab was negative in 2019. Work up in 2015 confirmed chronic infection in  2015 with genotype 1b, HCv VL was 1.56 million IU at the time. Will repeat VL now and if remains positive, discuss treatment options perhaps as an OP if viremia persists.  OPAT ORDERS:  Diagnosis: MSSA sepsis  Culture Result: MSSA on blood  Allergies  Allergen Reactions  . Cefazolin Other (See Comments)    Severe thrombocytopenia (has tolerated zosyn in the past) ID aware Tolerated in October 2019    Discharge antibiotics: Cefepime 2 gm IV after each HD session  Duration: 2 weeks End Date:  10/02/2018  PIC Care and Maintenance Per Protocol Not indicated as ABX to be administered via fistula at HD   Labs weekly while on IV antibiotics: _X_ CBC with differential _X_ BMP __ BMP TWICE WEEKLY** __ CMP __ CRP __ ESR __ Vancomycin trough  Fax weekly labs to [phone]  Clinic Follow Up Appt: With PCP, no ID f/u needed (will follow labs)    

## 2018-09-18 NOTE — Progress Notes (Addendum)
[facility] Progress Note   Subjective:   Patient seen in room. Reports feeling much better today, hoping she can go home soon. TDC removed on 09/17/2018 by IR. Has LUE AVF with strong thrill. EGD yesterday showed 2 ulcerations. S/p 3 units total PRBC, Hgb 8.9 this morning. Denies CP, palpitations, SOB, dyspnea, abdominal pain, N/V/D. Afebrile.   Objective Vitals:   09/17/18 1658 09/17/18 2026 09/17/18 2106 09/18/18 0458  BP: 131/90 (!) 154/90 (!) 143/90 (!) 161/97  Pulse: 87 84 83 78  Resp: 16 18 18 18   Temp: 98.8 F (37.1 C) 100.3 F (37.9 C) 99.1 F (37.3 C) 98.7 F (37.1 C)  TempSrc: Oral Oral Oral Oral  SpO2: 100% 99% 100% 100%  Weight:      Height:       Physical Exam General: Well appearing female, alert and in NAD Heart: RRR, no murmurs, rubs or gallops Lungs:CTA bilaterally without wheezing, rhonchi or rales Abdomen: Soft, non-tender, non-distended. + BS, no rebound tenderness/gaurding Extremities: No LE edema Dialysis Access: LUA AVF + thrill. Bandage over site of prior TDC, no erythema or bleeding noted.   Additional Objective Labs: Basic Metabolic Panel: Recent Labs  Lab 09/15/18 2153 09/15/18 2204 09/16/18 0252  NA 135 133* 135  K 4.1 4.0 4.0  CL 99  --  98  CO2 20*  --  21*  GLUCOSE 87  --  94  BUN 65*  --  70*  CREATININE 9.73*  --  9.84*  CALCIUM 9.1  --  9.0  PHOS 2.7  --   --    Liver Function Tests: Recent Labs  Lab 09/15/18 2153 09/16/18 0252  AST 29 28  ALT 20 18  ALKPHOS 64 57  BILITOT 0.8 0.9  PROT 5.5* 5.3*  ALBUMIN 2.6* 2.4*   Recent Labs  Lab 09/15/18 2153  LIPASE 34   CBC: Recent Labs  Lab 09/15/18 2153  09/16/18 0252 09/16/18 0906 09/16/18 1907 09/17/18 0247 09/17/18 1801 09/18/18 0312  WBC 8.9  --  11.4* 12.0* 8.3 6.2  --  6.1  NEUTROABS 7.9*  --   --   --   --   --   --   --   HGB 5.3*   < > 4.8* 6.1* 7.5* 6.6* 8.9* 8.9*  HCT 16.8*   < > 14.6* 17.3* 22.3* 19.9* 26.1* 25.9*  MCV 93.3  --  90.1  84.0 84.5 86.5  --  87.5  PLT 168  --  156 150 144* 122*  --  120*   < > = values in this interval not displayed.   Blood Culture    Component Value Date/Time   SDES BLOOD RIGHT HAND 09/16/2018 0252   SPECREQUEST  09/16/2018 0252    BOTTLES DRAWN AEROBIC ONLY Blood Culture adequate volume   CULT  09/16/2018 0252    NO GROWTH 1 DAY Performed at [facility], [place] [address] [place], [place] [address]    REPTSTATUS PENDING 09/16/2018 0252    Studies/Results: Ir Removal Tun Cv Cath W/o Fl  Result Date: 09/17/2018 INDICATION: Sepsis. End-stage renal failure on hemodialysis. Request for removal of tunneled hemodialysis catheter. Per patient was initially placed 4 years ago and she does not know which facility. EXAM: REMOVAL OF TUNNELED HEMODIALYSIS CATHETER MEDICATIONS: 1% lidocaine 15 mL ANESTHESIA/SEDATION: No sedation medication given. COMPLICATIONS: None immediate. PROCEDURE: Informed written consent was obtained from the patient following an explanation of the procedure, risks, benefits and alternatives to treatment. A time  out was performed prior to the initiation of the procedure. Maximal barrier sterile technique was utilized including mask, sterile gloves, sterile drape, hand hygiene, and ChloraPrep. 1% lidocaine was injected along the tract and around the cuff. Attempt was made to remove the tunneled catheter with gentle traction and dissection however the catheter was extremely scarred down. The cuff was palpated about 3 inches above the exit site of the catheter. Using a 11. Blade a small incision was made over the cuff. Using scissors and hemostatic cuff was dissected out the catheter was removed. Next 2 interrupted 3-0 Ethilon sutures were used to approximate the skin at the incision site and 1 interrupted suture was used to approximate the incision made just above the exit site taking care to make sure the actual catheter exit site remained open to allow for drainage and prevent  abscess formation. Dry gauze and a Tegaderm was placed over both sites. IMPRESSION: Successful removal of tunneled dialysis catheter. Read by: Gareth Eagle, PA-C Electronically Signed   By: Jacqulynn Cadet M.D.   On: 09/17/2018 12:54   Medications: . [START ON 09/19/2018] ceFEPime (MAXIPIME) IV     . sodium chloride   Intravenous Once  . amLODipine  10 mg Oral Daily  . calcium acetate  1,334 mg Oral TID WC  . Chlorhexidine Gluconate Cloth  6 each Topical Q0600  . cinacalcet  30 mg Oral Q breakfast  . ferric citrate  210 mg Oral TID WC  . metoprolol tartrate  25 mg Oral BID  . multivitamin  1 tablet Oral QHS  . pantoprazole (PROTONIX) IV  40 mg Intravenous Q12H    Dialysis Orders: TTS at [facility] 3:45hr, 400/800, EDW 55.5kg, 2K/2.5Ca, TDC, no heparin - Hectoral 44mcg IV q HD - No ESA recently, last Hgb 11.9 on 09/07/2018  Assessment/Plan: 1. Symptomatic anemia/ + FOBT: Large drop in hemoglobin on admission. S/p 3 units total PRBC. EGD completed yesterday showed non-bleeding ulcers. Hgb 8.9 this AM, denies any further bleeding. PPI started. GI following with plan for repeat EGD and colonoscopy in 6-8 weeks.  2. MSSA bacteremia: Vomiting, diarrhea, fever and AMS on presentation. COVID negative and CXR clear. BCx positive for MSSA. Thought to be due to [facility], removed yesterday. Continues on vanc and cefepime. Afebrile and feeling much better today. Mental status back to baseline.  2. ESRD: TDC removed yesterday, AVF with strong thrill (use previously on hold due to large infiltration). HD tomorrow per regular schedule, will start to cannulate AVF 4/21 with 17g needles.  3. HTN/volume:  BP slightly elevated, no edema on exam. Will plan for volume removal as tolerated with HD tomorrow.  4. Anemia: See above. Hgb 8.9 this AM.  5. Secondary hyperparathyroidism:  Corrected calcium 10.3. VDRA on hold. Last phos 2.7. Continue sensipar and home binders.  6. Nutrition:  Albumin 2.4. Will order pro-stat  supplement. 7. Hep C: per primary    Anice Paganini, PA-C 09/18/2018, 11:34 AM  [facility] Pager: [phone]  I have seen and examined this patient and agree with plan and assessment in the above note with renal recommendations/intervention highlighted.  Infected HD catheter has been removed and agree with using LUE AVF with 17 gauge needles and reduced bfr.  Hgb stable.  Governor Rooks Greidy Sherard,MD 09/18/2018 2:39 PM

## 2018-09-18 NOTE — Progress Notes (Addendum)
PROGRESS NOTE   Karen Morrow  HWE:[id number]    DOB: 1961-02-15    DOA: 09/15/2018  PCP: [name], MD   I have briefly reviewed patients previous medical records in [facility].  Brief Narrative:  58 year old female with PMH of ESRD on TTS HD, HTN, GERD, gastric ulcer, hepatitis C, asthma, chronic CHF, presented to [facility] ED via EMS on 09/15/2018 due to high fever, nonproductive cough, dyspnea and AMS which reportedly started on day of arrival.  Patient is a poor historian.  Met sepsis criteria in the ED without clear source.  COVID testing negative.  Empirically started on IV vancomycin and cefepime.  Sepsis improved.  Hemoglobin down to 4.8 which was in 9 g range in early April per nephrology along with unclear history of melena and blood in stool.  Transfused for anemia.  GI consulted and s/p EGD 4/19.  Nephrology consulted for dialysis needs.  ID automatically consulted for MSSA bacteremia.  Clinically improved.   Assessment & Plan:   Principal Problem:   Sepsis ([place]) Active Problems:   Gastric ulcer   HTN (hypertension)   ESRD (end stage renal disease) (HCC)   Gastroesophageal reflux disease   Anemia in chronic kidney disease, on chronic dialysis (HCC)   Acute GI bleeding   Symptomatic anemia   Severe systemic inflammatory response syndrome (SIRS) (HCC)   Altered mental status   Tobacco abuse   1. MSSA bacteremia and sepsis: Likely due to [facility].  Met sepsis criteria on admission. RVP and SARS coronavirus 2 testing negative.  Blood cultures x2 from 4/17: Pansensitive MSSA.  Blood culture 4/18: Negative to date.  BCID 4/17 shows MSSA positive.  Chest x-ray without acute findings.  Patient had left TDC in place on admission and an aVF which is ready to use.  TDC removed by IR on 4/19.  Empirically started on IV vancomycin and cefepime, as per ID recommendations cefepime continued and vancomycin discontinued.  Sepsis resolved.  Reportedly had severe thrombocytopenia to  cefazolin. Formal ID note pending-await recommendations regarding need for further work-up (do not supposed so) and duration of antibiotics-possibly 6 weeks at HD. 2. Symptomatic anemia: Hemoglobin in the 9 range in early April per nephrology.  Down to 5.4 on admission >4.8 on 4/18.  After 3 units PRBCs, hemoglobin finally stabilized to 8.9 g.  Gives unclear history of melena and rectal bleed, could be due to GI bleeding from known chronic gastric ulcer.  GI consulted and s/p EGD 4/19 with results as below.  Follow CBC in a.m. 3. Suspected subacute upper GI bleed/gastric ulcer/GERD: Reviewed prior records and has had EGD last one in 2018 which showed gastric ulcer.  IV PPI started.  GI consulted and underwent EGD with results as below, reflux esophagitis, nonbleeding chronic gastric ulcer with no stigmata of bleeding.  Unclear reason why she had such substantial drop in her hemoglobin.?  Need for further work-up i.e. bleeding scan/colonoscopy.  I discussed in detail with Dr. Carlean Purl, [name], no plans for further evaluation at this time, continue to monitor hemoglobin closely, needs repeat EGD in 6 to 8 weeks to assess for ulcer healing and can have simultaneous colonoscopy at that time.  Likely will need lifelong PPI, switched Protonix to twice daily for 1 month and then once daily. 4. ESRD on TTS HD: Nephrology consulted.  Status post HD 4/18.  Next HD on 4/21.  Plan to use new LUE AVF. 5. Essential hypertension: Controlled.  As per pharmacy update  of home medications, patient is on metoprolol and not labetalol and Auryxia not PhosLo.  Continue metoprolol. 6. Acute encephalopathy: Resolved. 7. Tobacco abuse: Cessation counseled. 8. Hepatitis C: Outpatient follow-up. 9. Metabolic bone disease: Management per nephrology. 10. Thrombocytopenia: Could be related to sepsis and blood transfusion.  Stable.  Follow CBC closely.  DVT prophylaxis: SCDs Code Status: Full Family Communication: Discussed with  spouse via phone, updated care answered questions. Disposition: DC home pending clinical improvement, hopefully after HD on 4/21.   Consultants:  Nephrology GI/Dr. Benson Norway IR ID-aware of patient since 4/18 and formal note pending.  Procedures:  EGD 4/19:  Impression: - LA Grade A reflux esophagitis. - Non-bleeding chronic gastric ulcer with no stigmata of bleeding. - Non-bleeding duodenal diverticulum. - No specimens collected.  Recommendation: PPI once daily indefinitely.  Antimicrobials:  IV cefepime Vancomycin discontinued   Subjective: Patient reports that she has been having daily black stools at least for the last week but prior to that she was having normal colored stools even when on iron supplements.  However her history is not fully reliable.  She denies any other complaints.  ROS: As above, otherwise negative.  Objective:  Vitals:   09/17/18 2026 09/17/18 2106 09/18/18 0458 09/18/18 1353  BP: (!) 154/90 (!) 143/90 (!) 161/97 (!) 146/93  Pulse: 84 83 78 80  Resp: _0 Temp: 100.3 F (37.9 C) 99.1 F (37.3 C) 98.7 F (37.1 C) 98.6 F (37 C)  TempSrc: Oral Oral Oral Oral  SpO2: 99% 100% 100% 99%  Weight:      Height:        Examination:  General exam: Middle-aged female, moderately built and nourished lying comfortably propped up in bed.  Mucosa moist. Respiratory system: Clear to auscultation.  No increased work of breathing. Cardiovascular system: S1 & S2 heard, RRR. No JVD, murmurs, rubs, gallops or clicks. No pedal edema.  Stable. Gastrointestinal system: Abdomen is nondistended, soft and nontender. No organomegaly or masses felt. Normal bowel sounds heard.  Stable Central nervous system: Alert and oriented. No focal neurological deficits.  Stable Extremities: Symmetric 5 x 5 power. Skin: No rashes, lesions or ulcers Psychiatry: Judgement and insight impaired. Mood & affect appropriate.     Data Reviewed: I have personally reviewed  following labs and imaging studies  CBC: Recent Labs  Lab 09/15/18 2153  09/16/18 0252 09/16/18 0906 09/16/18 1907 09/17/18 0247 09/17/18 1801 09/18/18 0312  WBC 8.9  --  11.4* 12.0* 8.3 6.2  --  6.1  NEUTROABS 7.9*  --   --   --   --   --   --   --   HGB 5.3*   < > 4.8* 6.1* 7.5* 6.6* 8.9* 8.9*  HCT 16.8*   < > 14.6* 17.3* 22.3* 19.9* 26.1* 25.9*  MCV 93.3  --  90.1 84.0 84.5 86.5  --  87.5  PLT 168  --  156 150 144* 122*  --  120*   < > = values in this interval not displayed.   Basic Metabolic Panel: Recent Labs  Lab 09/15/18 2153 09/15/18 2204 09/16/18 0252  NA 135 133* 135  K 4.1 4.0 4.0  CL 99  --  98  CO2 20*  --  21*  GLUCOSE 87  --  94  BUN 65*  --  70*  CREATININE 9.73*  --  9.84*  CALCIUM 9.1  --  9.0  MG 1.8  --   --   PHOS 2.7  --   --  Liver Function Tests: Recent Labs  Lab 09/15/18 2153 09/16/18 0252  AST 29 28  ALT 20 18  ALKPHOS 64 57  BILITOT 0.8 0.9  PROT 5.5* 5.3*  ALBUMIN 2.6* 2.4*   Coagulation Profile: Recent Labs  Lab 09/15/18 2153  INR 1.0     Recent Results (from the past 240 hour(s))  SARS Coronavirus 2 [facility] order, Performed in [facility] hospital lab)     Status: None   Collection Time: 09/15/18  9:12 PM  Result Value Ref Range Status   SARS Coronavirus 2 NEGATIVE NEGATIVE Final    Comment: (NOTE) If result is NEGATIVE SARS-CoV-2 target nucleic acids are NOT DETECTED. The SARS-CoV-2 RNA is generally detectable in upper and lower  respiratory specimens during the acute phase of infection. The lowest  concentration of SARS-CoV-2 viral copies this assay can detect is 250  copies / mL. A negative result does not preclude SARS-CoV-2 infection  and should not be used as the sole basis for treatment or other  patient management decisions.  A negative result may occur with  improper specimen collection / handling, submission of specimen other  than nasopharyngeal swab, presence of viral mutation(s) within the  areas  targeted by this assay, and inadequate number of viral copies  (<250 copies / mL). A negative result must be combined with clinical  observations, patient history, and epidemiological information. If result is POSITIVE SARS-CoV-2 target nucleic acids are DETECTED. The SARS-CoV-2 RNA is generally detectable in upper and lower  respiratory specimens dur ing the acute phase of infection.  Positive  results are indicative of active infection with SARS-CoV-2.  Clinical  correlation with patient history and other diagnostic information is  necessary to determine patient infection status.  Positive results do  not rule out bacterial infection or co-infection with other viruses. If result is PRESUMPTIVE POSTIVE SARS-CoV-2 nucleic acids MAY BE PRESENT.   A presumptive positive result was obtained on the submitted specimen  and confirmed on repeat testing.  While 2019 novel coronavirus  (SARS-CoV-2) nucleic acids may be present in the submitted sample  additional confirmatory testing may be necessary for epidemiological  and / or clinical management purposes  to differentiate between  SARS-CoV-2 and other Sarbecovirus currently known to infect humans.  If clinically indicated additional testing with an alternate test  methodology [phone]) is advised. The SARS-CoV-2 RNA is generally  detectable in upper and lower respiratory sp ecimens during the acute  phase of infection. The expected result is Negative. Fact Sheet for Patients:  StrictlyIdeas.no Fact Sheet for Healthcare Providers: BankingDealers.co.za This test is not yet approved or cleared by the [place] FDA and has been authorized for detection and/or diagnosis of SARS-CoV-2 by FDA under an Emergency Use Authorization (EUA).  This EUA will remain in effect (meaning this test can be used) for the duration of the COVID-19 declaration under Section 564(b)(1) of the Act, 21 U.S.C. section  360bbb-3(b)(1), unless the authorization is terminated or revoked sooner. Performed at [facility], [place] [address], [place], [place] [address]   Respiratory Panel by PCR     Status: None   Collection Time: 09/15/18  9:12 PM  Result Value Ref Range Status   Adenovirus NOT DETECTED NOT DETECTED Final   Coronavirus 229E NOT DETECTED NOT DETECTED Final    Comment: (NOTE) The Coronavirus on the Respiratory Panel, DOES NOT test for the novel  Coronavirus (2019 nCoV)    Coronavirus HKU1 NOT DETECTED NOT DETECTED Final  Coronavirus NL63 NOT DETECTED NOT DETECTED Final   Coronavirus OC43 NOT DETECTED NOT DETECTED Final   Metapneumovirus NOT DETECTED NOT DETECTED Final   Rhinovirus / Enterovirus NOT DETECTED NOT DETECTED Final   Influenza A NOT DETECTED NOT DETECTED Final   Influenza B NOT DETECTED NOT DETECTED Final   Parainfluenza Virus 1 NOT DETECTED NOT DETECTED Final   Parainfluenza Virus 2 NOT DETECTED NOT DETECTED Final   Parainfluenza Virus 3 NOT DETECTED NOT DETECTED Final   Parainfluenza Virus 4 NOT DETECTED NOT DETECTED Final   Respiratory Syncytial Virus NOT DETECTED NOT DETECTED Final   Bordetella pertussis NOT DETECTED NOT DETECTED Final   Chlamydophila pneumoniae NOT DETECTED NOT DETECTED Final   Mycoplasma pneumoniae NOT DETECTED NOT DETECTED Final    Comment: Performed at [facility], [place] [address], [place], [place] [address]  Blood Culture (routine x 2)     Status: Abnormal   Collection Time: 09/15/18  9:30 PM  Result Value Ref Range Status   Specimen Description BLOOD RIGHT HAND  Final   Special Requests   Final    BOTTLES DRAWN AEROBIC AND ANAEROBIC Blood Culture adequate volume   Culture  Setup Time   Final    GRAM POSITIVE COCCI IN CLUSTERS IN BOTH AEROBIC AND ANAEROBIC BOTTLES CRITICAL VALUE NOTED.  VALUE IS CONSISTENT WITH PREVIOUSLY REPORTED AND CALLED VALUE.    Culture (A)  Final    STAPHYLOCOCCUS AUREUS SUSCEPTIBILITIES PERFORMED ON PREVIOUS  CULTURE WITHIN THE LAST 5 DAYS. Performed at [facility], [place] [address], [place], [place] [address]    Report Status 09/18/2018 FINAL  Final  Blood Culture (routine x 2)     Status: Abnormal   Collection Time: 09/15/18  9:38 PM  Result Value Ref Range Status   Specimen Description BLOOD LEFT LOWER ARM  Final   Special Requests   Final    BOTTLES DRAWN AEROBIC AND ANAEROBIC Blood Culture adequate volume   Culture  Setup Time   Final    GRAM POSITIVE COCCI IN CLUSTERS IN BOTH AEROBIC AND ANAEROBIC BOTTLES CRITICAL RESULT CALLED TO, READ BACK BY AND VERIFIED WITH: T. DANG, PHARMD AT 1220 ON 09/16/18 BY C. JESSUP, MLT. Performed at [facility], [place] [address], [place], [place] [address]    Culture STAPHYLOCOCCUS AUREUS (A)  Final   Report Status 09/18/2018 FINAL  Final   Organism ID, Bacteria STAPHYLOCOCCUS AUREUS  Final      Susceptibility   Staphylococcus aureus - MIC*    CIPROFLOXACIN 1 SENSITIVE Sensitive     ERYTHROMYCIN 0.5 SENSITIVE Sensitive     GENTAMICIN <=0.5 SENSITIVE Sensitive     OXACILLIN 0.5 SENSITIVE Sensitive     TETRACYCLINE <=1 SENSITIVE Sensitive     VANCOMYCIN 1 SENSITIVE Sensitive     TRIMETH/SULFA <=10 SENSITIVE Sensitive     CLINDAMYCIN <=0.25 SENSITIVE Sensitive     RIFAMPIN <=0.5 SENSITIVE Sensitive     Inducible Clindamycin NEGATIVE Sensitive     * STAPHYLOCOCCUS AUREUS  Blood Culture ID Panel (Reflexed)     Status: Abnormal   Collection Time: 09/15/18  9:38 PM  Result Value Ref Range Status   Enterococcus species NOT DETECTED NOT DETECTED Final   Listeria monocytogenes NOT DETECTED NOT DETECTED Final   Staphylococcus species DETECTED (A) NOT DETECTED Final    Comment: CRITICAL RESULT CALLED TO, READ BACK BY AND VERIFIED WITH: T. DANG, PHARMD AT 1220 ON 09/16/18 BY C. JESSUP, MLT.    Staphylococcus aureus (  BCID) DETECTED (A) NOT DETECTED Final    Comment: Methicillin (oxacillin) susceptible Staphylococcus aureus (MSSA). Preferred therapy  is anti staphylococcal beta lactam antibiotic (Cefazolin or Nafcillin), unless clinically contraindicated. CRITICAL RESULT CALLED TO, READ BACK BY AND VERIFIED WITH: T. DANG, PHARMD AT 1220 ON 09/16/18 BY C. JESSUP, MLT.    Methicillin resistance NOT DETECTED NOT DETECTED Final   Streptococcus species NOT DETECTED NOT DETECTED Final   Streptococcus agalactiae NOT DETECTED NOT DETECTED Final   Streptococcus pneumoniae NOT DETECTED NOT DETECTED Final   Streptococcus pyogenes NOT DETECTED NOT DETECTED Final   Acinetobacter baumannii NOT DETECTED NOT DETECTED Final   Enterobacteriaceae species NOT DETECTED NOT DETECTED Final   Enterobacter cloacae complex NOT DETECTED NOT DETECTED Final   Escherichia coli NOT DETECTED NOT DETECTED Final   Klebsiella oxytoca NOT DETECTED NOT DETECTED Final   Klebsiella pneumoniae NOT DETECTED NOT DETECTED Final   Proteus species NOT DETECTED NOT DETECTED Final   Serratia marcescens NOT DETECTED NOT DETECTED Final   Haemophilus influenzae NOT DETECTED NOT DETECTED Final   Neisseria meningitidis NOT DETECTED NOT DETECTED Final   Pseudomonas aeruginosa NOT DETECTED NOT DETECTED Final   Candida albicans NOT DETECTED NOT DETECTED Final   Candida glabrata NOT DETECTED NOT DETECTED Final   Candida krusei NOT DETECTED NOT DETECTED Final   Candida parapsilosis NOT DETECTED NOT DETECTED Final   Candida tropicalis NOT DETECTED NOT DETECTED Final    Comment: Performed at [facility], [place] [address] [place], [place] [address]  Culture, blood (single)     Status: None (Preliminary result)   Collection Time: 09/16/18  2:52 AM  Result Value Ref Range Status   Specimen Description BLOOD RIGHT HAND  Final   Special Requests   Final    BOTTLES DRAWN AEROBIC ONLY Blood Culture adequate volume   Culture   Final    NO GROWTH 2 DAYS Performed at [facility], [place] [address] [place], [place] [address]    Report Status PENDING  Incomplete         Radiology  Studies: Ir Removal Tun Cv Cath W/o Fl  Result Date: 09/17/2018 INDICATION: Sepsis. End-stage renal failure on hemodialysis. Request for removal of tunneled hemodialysis catheter. Per patient was initially placed 4 years ago and she does not know which facility. EXAM: REMOVAL OF TUNNELED HEMODIALYSIS CATHETER MEDICATIONS: 1% lidocaine 15 mL ANESTHESIA/SEDATION: No sedation medication given. COMPLICATIONS: None immediate. PROCEDURE: Informed written consent was obtained from the patient following an explanation of the procedure, risks, benefits and alternatives to treatment. A time out was performed prior to the initiation of the procedure. Maximal barrier sterile technique was utilized including mask, sterile gloves, sterile drape, hand hygiene, and ChloraPrep. 1% lidocaine was injected along the tract and around the cuff. Attempt was made to remove the tunneled catheter with gentle traction and dissection however the catheter was extremely scarred down. The cuff was palpated about 3 inches above the exit site of the catheter. Using a 11. Blade a small incision was made over the cuff. Using scissors and hemostatic cuff was dissected out the catheter was removed. Next 2 interrupted 3-0 Ethilon sutures were used to approximate the skin at the incision site and 1 interrupted suture was used to approximate the incision made just above the exit site taking care to make sure the actual catheter exit site remained open to allow for drainage and prevent abscess formation. Dry gauze and a Tegaderm was placed over both sites. IMPRESSION: Successful removal  of tunneled dialysis catheter. Read by: Gareth Eagle, PA-C Electronically Signed   By: Jacqulynn Cadet M.D.   On: 09/17/2018 12:54        Scheduled Meds:  sodium chloride   Intravenous Once   amLODipine  10 mg Oral Daily   calcium acetate  1,334 mg Oral TID WC   Chlorhexidine Gluconate Cloth  6 each Topical Q0600   cinacalcet  30 mg Oral Q breakfast    feeding supplement (PRO-STAT SUGAR FREE 64)  30 mL Oral BID   ferric citrate  210 mg Oral TID WC   metoprolol tartrate  25 mg Oral BID   multivitamin  1 tablet Oral QHS   pantoprazole  40 mg Oral BID   Continuous Infusions:  [START ON 09/19/2018] ceFEPime (MAXIPIME) IV       LOS: 3 days     Vernell Leep, MD, FACP, [facility]. [facility]  To contact the attending provider between 7A-7P or the covering provider during after hours 7P-7A, please log into the web site [email or web address] and access using universal [place] password for that web site. If you do not have the password, please call the hospital operator.  09/18/2018, 4:11 PM

## 2018-09-18 NOTE — Progress Notes (Addendum)
Daily Rounding Note  09/18/2018, 10:13 AM  LOS: 3 days   SUBJECTIVE:   Chief complaint:  Gastric ulcer.  Acute on chronic anemia.     Updated patient as to endoscopic findings. Patient does not know what meds she takes and admits that she is not always compliant with taking her meds. Stools are always dark/black.  Had one this morning. No GI upset.  Tolerating solid food.  OBJECTIVE:         Vital signs in last 24 hours:    Temp:  [98.1 F (36.7 C)-100.3 F (37.9 C)] 98.7 F (37.1 C) (04/20 0458) Pulse Rate:  [78-91] 78 (04/20 0458) Resp:  [16-27] 18 (04/20 0458) BP: (104-161)/(56-97) 161/97 (04/20 0458) SpO2:  [99 %-100 %] 100 % (04/20 0458) Last BM Date: 09/18/18 Filed Weights   09/15/18 2229 09/16/18 1220 09/16/18 1619  Weight: 57.2 kg 58.2 kg 56.2 kg   General: Chronically unwell appearing. Heart: RRR. Chest: Clear bilaterally. Abdomen: Nontender, soft.  Active bowel sounds.  No distention. Extremities: No CCE. Neuro/Psych: Slightly drowsy but easily awakened.  No tremors, no gross deficits.  Intake/Output from previous day: 04/19 0701 - 04/20 0700 In: 715 [I.V.:400; Blood:315] Out: 0   Intake/Output this shift: Total I/O In: 300 [P.O.:300] Out: -   Lab Results: Recent Labs    09/16/18 1907 09/17/18 0247 09/17/18 1801 09/18/18 0312  WBC 8.3 6.2  --  6.1  HGB 7.5* 6.6* 8.9* 8.9*  HCT 22.3* 19.9* 26.1* 25.9*  PLT 144* 122*  --  120*   BMET Recent Labs    09/15/18 2153 09/15/18 2204 09/16/18 0252  NA 135 133* 135  K 4.1 4.0 4.0  CL 99  --  98  CO2 20*  --  21*  GLUCOSE 87  --  94  BUN 65*  --  70*  CREATININE 9.73*  --  9.84*  CALCIUM 9.1  --  9.0   LFT Recent Labs    09/15/18 2153 09/16/18 0252  PROT 5.5* 5.3*  ALBUMIN 2.6* 2.4*  AST 29 28  ALT 20 18  ALKPHOS 64 57  BILITOT 0.8 0.9   PT/INR Recent Labs    09/15/18 2153  LABPROT 13.5  INR 1.0   Hepatitis Panel No  results for input(s): HEPBSAG, HCVAB, HEPAIGM, HEPBIGM in the last 72 hours.  Studies/Results: Ir Removal Tun Cv Cath W/o Fl  Result Date: 09/17/2018 INDICATION: Sepsis. End-stage renal failure on hemodialysis. Request for removal of tunneled hemodialysis catheter. Per patient was initially placed 4 years ago and she does not know which facility. EXAM: REMOVAL OF TUNNELED HEMODIALYSIS CATHETER MEDICATIONS: 1% lidocaine 15 mL ANESTHESIA/SEDATION: No sedation medication given. COMPLICATIONS: None immediate. PROCEDURE: Informed written consent was obtained from the patient following an explanation of the procedure, risks, benefits and alternatives to treatment. A time out was performed prior to the initiation of the procedure. Maximal barrier sterile technique was utilized including mask, sterile gloves, sterile drape, hand hygiene, and ChloraPrep. 1% lidocaine was injected along the tract and around the cuff. Attempt was made to remove the tunneled catheter with gentle traction and dissection however the catheter was extremely scarred down. The cuff was palpated about 3 inches above the exit site of the catheter. Using a 11. Blade a small incision was made over the cuff. Using scissors and hemostatic cuff was dissected out the catheter was removed. Next 2 interrupted 3-0 Ethilon sutures were used to approximate the skin at  the incision site and 1 interrupted suture was used to approximate the incision made just above the exit site taking care to make sure the actual catheter exit site remained open to allow for drainage and prevent abscess formation. Dry gauze and a Tegaderm was placed over both sites. IMPRESSION: Successful removal of tunneled dialysis catheter. Read by: Gareth Eagle, PA-C Electronically Signed   By: Jacqulynn Cadet M.D.   On: 09/17/2018 12:54    ASSESMENT:   *    IDA, recurrent acute on chronic blood loss anemia, (?melena).  Gastric ulcers at EGD in 05/2013, 08/2016.  Biopsies negative for  H. Pylori on both occasions. No PPI etc at home, on Ferric citrate at home.  No anemia studies found after 2017.   09/17/2018 EGD.  Mild esophagitis.  Chronic, nonbleeding gastric ulcer without stigmata of bleeding.  Nonbleeding duodenal diverticulum. Hgb 4.8 >> 3 U PRBCs >> 8.9 Unassigned GI pt.  Previous EGDs with Oletta Darter.    *     ESRD.  On hemodialysis TTS.  *   Non-critical thrombocytopenia. Dates to 2011. Severe (platelets 12) in 2015.   *   Brain atrophy.  AMS at admission, improved.  *   Fatty liver per CT 02/2018.  INR normal.     PLAN   *   Per Dr Carlean Purl.    *   She is going to need repeat EGD in 6 to 8 weeks to assess for ulcer healing.  Ought to have simultaneous colonoscopy along with the EGD.  Sharren Bridge will need lifelong PPI.  *  Switch to po protonix, BId for 1 month, then drop to 1 x daily.    *   CBC in AM.      Azucena Freed  09/18/2018, 10:13 AM Phone [place] Attending   I have taken an interval history, reviewed the chart and examined the patient. I agree with the Advanced Practitioner's note, impression and recommendations.    Gatha Mayer, MD, [facility]  Gastroenterology 09/18/2018 3:01 PM Pager [phone]

## 2018-09-19 DIAGNOSIS — K922 Gastrointestinal hemorrhage, unspecified: Secondary | ICD-10-CM

## 2018-09-19 DIAGNOSIS — Z992 Dependence on renal dialysis: Secondary | ICD-10-CM

## 2018-09-19 LAB — CBC
HCT: 27.5 % — ABNORMAL LOW (ref 36.0–46.0)
Hemoglobin: 9.5 g/dL — ABNORMAL LOW (ref 12.0–15.0)
MCH: 30.4 pg (ref 26.0–34.0)
MCHC: 34.5 g/dL (ref 30.0–36.0)
MCV: 87.9 fL (ref 80.0–100.0)
Platelets: 114 10*3/uL — ABNORMAL LOW (ref 150–400)
RBC: 3.13 MIL/uL — ABNORMAL LOW (ref 3.87–5.11)
RDW: 16.5 % — ABNORMAL HIGH (ref 11.5–15.5)
WBC: 8.3 10*3/uL (ref 4.0–10.5)
nRBC: 0 % (ref 0.0–0.2)

## 2018-09-19 LAB — RENAL FUNCTION PANEL
Albumin: 2.3 g/dL — ABNORMAL LOW (ref 3.5–5.0)
Anion gap: 14 (ref 5–15)
BUN: 37 mg/dL — ABNORMAL HIGH (ref 6–20)
CO2: 23 mmol/L (ref 22–32)
Calcium: 8.4 mg/dL — ABNORMAL LOW (ref 8.9–10.3)
Chloride: 97 mmol/L — ABNORMAL LOW (ref 98–111)
Creatinine, Ser: 6.52 mg/dL — ABNORMAL HIGH (ref 0.44–1.00)
GFR calc Af Amer: 7 mL/min — ABNORMAL LOW (ref >60)
GFR calc non Af Amer: 6 mL/min — ABNORMAL LOW (ref >60)
Glucose, Bld: 87 mg/dL (ref 70–99)
Phosphorus: 1.9 mg/dL — ABNORMAL LOW (ref 2.5–4.6)
Potassium: 3.4 mmol/L — ABNORMAL LOW (ref 3.5–5.1)
Sodium: 134 mmol/L — ABNORMAL LOW (ref 135–145)

## 2018-09-19 MED ORDER — CINACALCET HCL 30 MG PO TABS: ORAL_TABLET | ORAL | 0 refills | Status: DC

## 2018-09-19 MED ORDER — CALCIUM ACETATE 667 MG PO CAPS: 1334.0000 mg | ORAL_CAPSULE | Freq: Three times a day (TID) | ORAL | 0 refills | Status: DC

## 2018-09-19 MED ORDER — PANTOPRAZOLE SODIUM 40 MG PO TBEC: 40.0000 mg | DELAYED_RELEASE_TABLET | Freq: Two times a day (BID) | ORAL | 0 refills | Status: DC

## 2018-09-19 MED ORDER — PRO-STAT SUGAR FREE PO LIQD: 30.0000 mL | Freq: Two times a day (BID) | ORAL | 0 refills | Status: DC

## 2018-09-19 MED ORDER — SODIUM CHLORIDE 0.9 % IV SOLN: 2.0000 g | INTRAVENOUS | Status: AC

## 2018-09-19 NOTE — Progress Notes (Signed)
PT Cancellation Note  Patient Details Name: Karen Morrow MRN: [id number] DOB: 1960-07-24   Cancelled Treatment:    Reason Eval/Treat Not Completed: Patient at procedure or test/unavailable. Pt just left for HD. PT to return as able to complete PT eval.  Kittie Plater, PT, DPT Acute Rehabilitation Services Pager #: [phone] Office #: [phone]    Berline Lopes 09/19/2018, 7:29 AM

## 2018-09-19 NOTE — Progress Notes (Addendum)
   Patient Name: Karen Morrow Date of Encounter: 09/19/2018, 12:24 PM    Subjective  Feels great after dialysis    Objective  BP 138/88 (BP Location: Right Arm)   Pulse 81   Temp 98.4 F (36.9 C) (Oral)   Resp 20   Ht 5\' 7"  (1.702 m)   Wt 55.3 kg   SpO2 100%   BMI 19.09 kg/m  NAD  CBC Latest Ref Rng & Units 09/19/2018 09/18/2018 09/17/2018  WBC 4.0 - 10.5 K/uL 8.3 6.1 -  Hemoglobin 12.0 - 15.0 g/dL 9.5(L) 8.9(L) 8.9(L)  Hematocrit 36.0 - 46.0 % 27.5(L) 25.9(L) 26.1(L)  Platelets 150 - 400 K/uL 114(L) 120(L) -       Assessment and Plan  Chronic gastric ulcer GI bleeding/multifactorial anemia  Not bleeding now that I can tell Will sign off and get her f/u in our office that will show on dc summary No anti-coag agents please though heparin w/ HD ok BID high dose PPI at dx (e.g. 40 mg pantoprazole before breakfast and supper)  Gatha Mayer, MD, [facility] 09/19/2018 12:24 PM Pager [phone]

## 2018-09-19 NOTE — Progress Notes (Signed)
PHARMACY CONSULT NOTE FOR:  OUTPATIENT  PARENTERAL ANTIBIOTIC THERAPY (OPAT)  Indication: MSSA bacteremia Regimen: Cefepime 2 grams iv Q Hemodialysis End date: Oct 02, 2018  IV antibiotic discharge orders are pended. To discharging provider:  please sign these orders via discharge navigator,  Select New Orders & click on the button choice - Manage This Unsigned Work.     Thank you for allowing pharmacy to be a part of this patient's care.  Tad Moore 09/19/2018, 1:54 PM

## 2018-09-19 NOTE — Progress Notes (Signed)
Patient went to dialysis. Alert and oriented, no complaints at this time.

## 2018-09-19 NOTE — Progress Notes (Signed)
Nsg Discharge Note  Admit Date:  09/15/2018 Discharge date: 09/19/2018   Jordan Likes Lenz to be D/C'd Home per MD order.  AVS completed.  Copy for chart, and copy for patient signed, and dated. Patient/caregiver able to verbalize understanding.  Discharge Medication: Allergies as of 09/19/2018      Reactions   Cefazolin Other (See Comments)   Severe thrombocytopenia (has tolerated zosyn in the past) ID aware Tolerated in October 2019      Medication List    TAKE these medications   acetaminophen 500 MG tablet Commonly known as:  TYLENOL Take 500-1,000 mg by mouth every 6 (six) hours as needed for headache (pain).   albuterol 108 (90 Base) MCG/ACT inhaler Commonly known as:  VENTOLIN HFA TAKE 2 PUFFS BY MOUTH EVERY 6 HOURS AS NEEDED FOR WHEEZE OR SHORTNESS OF BREATH What changed:  See the new instructions.   albuterol (2.5 MG/3ML) 0.083% nebulizer solution Commonly known as:  PROVENTIL Take 3 mLs (2.5 mg total) by nebulization every 6 (six) hours as needed for wheezing or shortness of breath. MUST MAKE APPT FOR FURTHER REFILLS What changed:  Another medication with the same name was changed. Make sure you understand how and when to take each.   amLODipine 10 MG tablet Commonly known as:  NORVASC Take 1 tablet (10 mg total) by mouth daily. What changed:  when to take this   Auryxia 1 GM 210 MG(Fe) tablet Generic drug:  ferric citrate Take 420 mg by mouth 3 (three) times daily with meals.   b complex-vitamin c-folic acid 0.8 MG Tabs tablet Take 1 tablet by mouth every evening.   calcium acetate 667 MG capsule Commonly known as:  PHOSLO Take 2 capsules (1,334 mg total) by mouth 3 (three) times daily with meals.   ceFEPIme 2 g in sodium chloride 0.9 % 100 mL Inject 2 g into the vein Every Tuesday,Thursday,and Saturday with dialysis for 11 days. Start taking on:  September 21, 2018   cinacalcet 30 MG tablet Commonly known as:  SENSIPAR TAKE 1 TABLET BY MOUTH DAILY EVERY  EVENING (DO NOT TAKE LESS THAN 12 HOURS PRIOR TO DIALYSIS)   feeding supplement (PRO-STAT SUGAR FREE 64) Liqd Take 30 mLs by mouth 2 (two) times daily.   metoprolol tartrate 25 MG tablet Commonly known as:  LOPRESSOR Take 25 mg by mouth 2 (two) times daily.   pantoprazole 40 MG tablet Commonly known as:  PROTONIX Take 1 tablet (40 mg total) by mouth 2 (two) times daily.       Discharge Assessment: Vitals:   09/19/18 1127 09/19/18 1147  BP: 136/85 138/88  Pulse: 86 81  Resp: 20 20  Temp: (!) 97.5 F (36.4 C) 98.4 F (36.9 C)  SpO2: 98% 100%   Skin clean, dry and intact without evidence of skin break down, no evidence of skin tears noted. IV catheter discontinued intact. Site without signs and symptoms of complications - no redness or edema noted at insertion site, patient denies c/o pain - only slight tenderness at site.  Dressing with slight pressure applied.  D/c Instructions-Education: Discharge instructions given to patient/family with verbalized understanding. D/c education completed with patient/family including follow up instructions, medication list, d/c activities limitations if indicated, with other d/c instructions as indicated by MD - patient able to verbalize understanding, all questions fully answered. Patient instructed to return to ED, call 911, or call MD for any changes in condition.  Patient escorted via [place], and D/C home via private auto.  Salley Slaughter, RN 09/19/2018 2:13 PM

## 2018-09-19 NOTE — Progress Notes (Signed)
Patient back from dialysis; alert and oriented x 4; no acute distress noted, no complaints; VS stable. Will continue to monitor.

## 2018-09-19 NOTE — Progress Notes (Signed)
[facility] for Infectious Disease   Reason for visit: Follow up on MSSA sepsis  Interval History: permacath removed on 09/17/2018; echo showed no evidence of endocarditis; tolerating HD today via LT arm AVF w/o event; complaining about breakfast given to her this AM but anxious to go home  fever curve, labs, and above studies all independently reviewed   Physical Exam: Vitals:   09/19/18 0930 09/19/18 1000  BP: 114/89 130/84  Pulse: 88 81  Resp:    Temp:    SpO2:    Physical Exam Gen: pleasant, NAD, A&Ox 3 Head: NCAT, no temporal wasting evident EENT: PERRL, EOMI, MMM, adequate dentition Neck: supple, no JVD CV: NRRR, no murmurs evident Pulm: CTA bilaterally, no wheeze or retractions Abd: soft, NTND, +BS Extrems: no LE edema, 2+ pulses, LT arm AVF with good bruit and audible thrill Skin: no rashes, adequate skin turgor Neuro: CN II-XII grossly intact, no focal neurologic deficits appreciated, gait was not assessed, A&Ox 3  Review of Systems: No fever or chills, denies N/V, no SOB, all other systems were reviewed and negative except as noted above.  Lab Results  Component Value Date   WBC 8.3 09/19/2018   HGB 9.5 (L) 09/19/2018   HCT 27.5 (L) 09/19/2018   MCV 87.9 09/19/2018   PLT 114 (L) 09/19/2018    Lab Results  Component Value Date   CREATININE 6.52 (H) 09/19/2018   BUN 37 (H) 09/19/2018   NA 134 (L) 09/19/2018   K 3.4 (L) 09/19/2018   CL 97 (L) 09/19/2018   CO2 23 09/19/2018    Lab Results  Component Value Date   ALT 18 09/16/2018   AST 28 09/16/2018   ALKPHOS 57 09/16/2018     Microbiology: Recent Results (from the past 240 hour(s))  SARS Coronavirus 2 [facility] order, Performed in [place] hospital lab)     Status: None   Collection Time: 09/15/18  9:12 PM  Result Value Ref Range Status   SARS Coronavirus 2 NEGATIVE NEGATIVE Final    Comment: (NOTE) If result is NEGATIVE SARS-CoV-2 target nucleic acids are NOT DETECTED. The SARS-CoV-2  RNA is generally detectable in upper and lower  respiratory specimens during the acute phase of infection. The lowest  concentration of SARS-CoV-2 viral copies this assay can detect is 250  copies / mL. A negative result does not preclude SARS-CoV-2 infection  and should not be used as the sole basis for treatment or other  patient management decisions.  A negative result may occur with  improper specimen collection / handling, submission of specimen other  than nasopharyngeal swab, presence of viral mutation(s) within the  areas targeted by this assay, and inadequate number of viral copies  (<250 copies / mL). A negative result must be combined with clinical  observations, patient history, and epidemiological information. If result is POSITIVE SARS-CoV-2 target nucleic acids are DETECTED. The SARS-CoV-2 RNA is generally detectable in upper and lower  respiratory specimens dur ing the acute phase of infection.  Positive  results are indicative of active infection with SARS-CoV-2.  Clinical  correlation with patient history and other diagnostic information is  necessary to determine patient infection status.  Positive results do  not rule out bacterial infection or co-infection with other viruses. If result is PRESUMPTIVE POSTIVE SARS-CoV-2 nucleic acids MAY BE PRESENT.   A presumptive positive result was obtained on the submitted specimen  and confirmed on repeat testing.  While 2019 novel coronavirus  (SARS-CoV-2) nucleic acids  may be present in the submitted sample  additional confirmatory testing may be necessary for epidemiological  and / or clinical management purposes  to differentiate between  SARS-CoV-2 and other Sarbecovirus currently known to infect humans.  If clinically indicated additional testing with an alternate test  methodology [phone]) is advised. The SARS-CoV-2 RNA is generally  detectable in upper and lower respiratory sp ecimens during the acute  phase of  infection. The expected result is Negative. Fact Sheet for Patients:  StrictlyIdeas.no Fact Sheet for Healthcare Providers: BankingDealers.co.za This test is not yet approved or cleared by the [place] FDA and has been authorized for detection and/or diagnosis of SARS-CoV-2 by FDA under an Emergency Use Authorization (EUA).  This EUA will remain in effect (meaning this test can be used) for the duration of the COVID-19 declaration under Section 564(b)(1) of the Act, 21 U.S.C. section 360bbb-3(b)(1), unless the authorization is terminated or revoked sooner. Performed at [facility], [place] [address] [place], [place] [address]   Respiratory Panel by PCR     Status: None   Collection Time: 09/15/18  9:12 PM  Result Value Ref Range Status   Adenovirus NOT DETECTED NOT DETECTED Final   Coronavirus 229E NOT DETECTED NOT DETECTED Final    Comment: (NOTE) The Coronavirus on the Respiratory Panel, DOES NOT test for the novel  Coronavirus (2019 nCoV)    Coronavirus HKU1 NOT DETECTED NOT DETECTED Final   Coronavirus NL63 NOT DETECTED NOT DETECTED Final   Coronavirus OC43 NOT DETECTED NOT DETECTED Final   Metapneumovirus NOT DETECTED NOT DETECTED Final   Rhinovirus / Enterovirus NOT DETECTED NOT DETECTED Final   Influenza A NOT DETECTED NOT DETECTED Final   Influenza B NOT DETECTED NOT DETECTED Final   Parainfluenza Virus 1 NOT DETECTED NOT DETECTED Final   Parainfluenza Virus 2 NOT DETECTED NOT DETECTED Final   Parainfluenza Virus 3 NOT DETECTED NOT DETECTED Final   Parainfluenza Virus 4 NOT DETECTED NOT DETECTED Final   Respiratory Syncytial Virus NOT DETECTED NOT DETECTED Final   Bordetella pertussis NOT DETECTED NOT DETECTED Final   Chlamydophila pneumoniae NOT DETECTED NOT DETECTED Final   Mycoplasma pneumoniae NOT DETECTED NOT DETECTED Final    Comment: Performed at [facility], [place]. [address] [place], [place] [address]   Blood Culture (routine x 2)     Status: Abnormal   Collection Time: 09/15/18  9:30 PM  Result Value Ref Range Status   Specimen Description BLOOD RIGHT HAND  Final   Special Requests   Final    BOTTLES DRAWN AEROBIC AND ANAEROBIC Blood Culture adequate volume   Culture  Setup Time   Final    GRAM POSITIVE COCCI IN CLUSTERS IN BOTH AEROBIC AND ANAEROBIC BOTTLES CRITICAL VALUE NOTED.  VALUE IS CONSISTENT WITH PREVIOUSLY REPORTED AND CALLED VALUE.    Culture (A)  Final    STAPHYLOCOCCUS AUREUS SUSCEPTIBILITIES PERFORMED ON PREVIOUS CULTURE WITHIN THE LAST 5 DAYS. Performed at [facility], [place] [address], [place], [place] [address]    Report Status 09/18/2018 FINAL  Final  Blood Culture (routine x 2)     Status: Abnormal   Collection Time: 09/15/18  9:38 PM  Result Value Ref Range Status   Specimen Description BLOOD LEFT LOWER ARM  Final   Special Requests   Final    BOTTLES DRAWN AEROBIC AND ANAEROBIC Blood Culture adequate volume   Culture  Setup Time   Final    GRAM POSITIVE COCCI  IN CLUSTERS IN BOTH AEROBIC AND ANAEROBIC BOTTLES CRITICAL RESULT CALLED TO, READ BACK BY AND VERIFIED WITH: T. DANG, PHARMD AT 1220 ON 09/16/18 BY C. JESSUP, MLT. Performed at [facility], [place] [address] [place], [place] [address]    Culture STAPHYLOCOCCUS AUREUS (A)  Final   Report Status 09/18/2018 FINAL  Final   Organism ID, Bacteria STAPHYLOCOCCUS AUREUS  Final      Susceptibility   Staphylococcus aureus - MIC*    CIPROFLOXACIN 1 SENSITIVE Sensitive     ERYTHROMYCIN 0.5 SENSITIVE Sensitive     GENTAMICIN <=0.5 SENSITIVE Sensitive     OXACILLIN 0.5 SENSITIVE Sensitive     TETRACYCLINE <=1 SENSITIVE Sensitive     VANCOMYCIN 1 SENSITIVE Sensitive     TRIMETH/SULFA <=10 SENSITIVE Sensitive     CLINDAMYCIN <=0.25 SENSITIVE Sensitive     RIFAMPIN <=0.5 SENSITIVE Sensitive     Inducible Clindamycin NEGATIVE Sensitive     * STAPHYLOCOCCUS AUREUS  Blood Culture ID Panel (Reflexed)      Status: Abnormal   Collection Time: 09/15/18  9:38 PM  Result Value Ref Range Status   Enterococcus species NOT DETECTED NOT DETECTED Final   Listeria monocytogenes NOT DETECTED NOT DETECTED Final   Staphylococcus species DETECTED (A) NOT DETECTED Final    Comment: CRITICAL RESULT CALLED TO, READ BACK BY AND VERIFIED WITH: T. DANG, PHARMD AT 1220 ON 09/16/18 BY C. JESSUP, MLT.    Staphylococcus aureus (BCID) DETECTED (A) NOT DETECTED Final    Comment: Methicillin (oxacillin) susceptible Staphylococcus aureus (MSSA). Preferred therapy is anti staphylococcal beta lactam antibiotic (Cefazolin or Nafcillin), unless clinically contraindicated. CRITICAL RESULT CALLED TO, READ BACK BY AND VERIFIED WITH: T. DANG, PHARMD AT 1220 ON 09/16/18 BY C. JESSUP, MLT.    Methicillin resistance NOT DETECTED NOT DETECTED Final   Streptococcus species NOT DETECTED NOT DETECTED Final   Streptococcus agalactiae NOT DETECTED NOT DETECTED Final   Streptococcus pneumoniae NOT DETECTED NOT DETECTED Final   Streptococcus pyogenes NOT DETECTED NOT DETECTED Final   Acinetobacter baumannii NOT DETECTED NOT DETECTED Final   Enterobacteriaceae species NOT DETECTED NOT DETECTED Final   Enterobacter cloacae complex NOT DETECTED NOT DETECTED Final   Escherichia coli NOT DETECTED NOT DETECTED Final   Klebsiella oxytoca NOT DETECTED NOT DETECTED Final   Klebsiella pneumoniae NOT DETECTED NOT DETECTED Final   Proteus species NOT DETECTED NOT DETECTED Final   Serratia marcescens NOT DETECTED NOT DETECTED Final   Haemophilus influenzae NOT DETECTED NOT DETECTED Final   Neisseria meningitidis NOT DETECTED NOT DETECTED Final   Pseudomonas aeruginosa NOT DETECTED NOT DETECTED Final   Candida albicans NOT DETECTED NOT DETECTED Final   Candida glabrata NOT DETECTED NOT DETECTED Final   Candida krusei NOT DETECTED NOT DETECTED Final   Candida parapsilosis NOT DETECTED NOT DETECTED Final   Candida tropicalis NOT DETECTED NOT  DETECTED Final    Comment: Performed at [facility], [place] [address] [place], [place] [address]  Culture, blood (single)     Status: None (Preliminary result)   Collection Time: 09/16/18  2:52 AM  Result Value Ref Range Status   Specimen Description BLOOD RIGHT HAND  Final   Special Requests   Final    BOTTLES DRAWN AEROBIC ONLY Blood Culture adequate volume   Culture   Final    NO GROWTH 2 DAYS Performed at [facility], [place] [address] [place], [place] [address]    Report Status PENDING  Incomplete  Impression/Plan: The patient is a 58 y/o AAF with ESRD & HCV admitted with fever, leukocytosis, AMS, and subsequently found to have MSSA sepsis.  1. MSSA sepsis - Pt reports she has had her current LT IJ permacath for ~3 years. External components to device w/o gross evidence of infection, but given pathogen isolated on blood, I agree with removing, especially as she has an alternate HD access point in the form of her LT AVF. Repeat blood cxs from 09/16/2018 are remaining negative to date. TTE showed no evidence of endocarditis, so plan to limit ABX duration to 2 weeks from the time of permacath explantation. Tentative d/c date for cefepime is 10/02/2018. Note: Due to the patient's reported thrombocytopenia in 2015 with cefazolin, will use cefepime instead as she has tolerated this well here. Dosing will be 2 gm IV after each HD session on TuTHSa.  2. Fever/leukocytosis - Fever curve continues to improve with ongoing ABX. Leukocytosis has now resolved.  3. ESRD - agree with plan to now dialyze pt via her LT AVF following explantation of her permacath. Discussed need for adherence to renal diet and phosphate binders to reduce scratching. Emphasized hand hygeine heavily with the patient. Now that her access is no longer externalized, this should natively reduce her risk for further episodes of sepsis.  4. HCV - HIV Ab was negative in 2019. Work up in 2015 confirmed chronic infection in  2015 with genotype 1b, HCv VL was 1.56 million IU at the time. Will repeat VL now and if remains positive, discuss treatment options perhaps as an OP if viremia persists.  OPAT ORDERS:  Diagnosis: MSSA sepsis  Culture Result: MSSA on blood  Allergies  Allergen Reactions  . Cefazolin Other (See Comments)    Severe thrombocytopenia (has tolerated zosyn in the past) ID aware Tolerated in October 2019    Discharge antibiotics: Cefepime 2 gm IV after each HD session  Duration: 2 weeks following permacath removal  End Date: 10/02/2018  [facility] Care and Maintenance Per Protocol Not indicated as ABX to be administered via fistula at HD   Labs weekly while on IV antibiotics: _X_ CBC with differential _X_ BMP __ BMP TWICE WEEKLY** __ CMP __ CRP __ ESR __ Vancomycin trough  Fax weekly labs to [phone]  Clinic Follow Up Appt: With PCP, no ID f/u needed unless HCV viremia persists (will follow labs)

## 2018-09-19 NOTE — Evaluation (Signed)
Physical Therapy Evaluation & Discharge Patient Details Name: Karen Morrow MRN: [id number] DOB: Oct 01, 1960 Today's Date: 09/19/2018   History of Present Illness  Pt is a 58 y.o. female admitted 09/15/18 with fever, cough, SOB and AMS; per report, pt had missed HD. Tested (-) COVID. Worked up for SIRS with suspicion for sepsis, symptomatic anemia. S/p EGD 4/19. PMH includes ESRD on HD (T/Th/Sat), asthma, CHF, HTN.    Clinical Impression  Patient evaluated by Physical Therapy with no further acute PT needs identified. PTA, pt indep and lives with husband. Today, pt performing ADLs and ambulating at supervision-level. All education has been completed and the patient has no further questions. Pt hopeful for d/c this afternoon, will have husband's assist; pt not interested in further PT services. Acute PT is signing off. Thank you for this referral.    Follow Up Recommendations No PT follow up    Equipment Recommendations  None recommended by PT    Recommendations for Other Services       Precautions / Restrictions Precautions Precautions: Fall Restrictions Weight Bearing Restrictions: No      Mobility  Bed Mobility Overal bed mobility: Independent                Transfers Overall transfer level: Independent                  Ambulation/Gait Ambulation/Gait assistance: Supervision Gait Distance (Feet): 100 Feet Assistive device: None Gait Pattern/deviations: Step-through pattern;Decreased stride length Gait velocity: Decreased Gait velocity interpretation: 1.31 - 2.62 ft/sec, indicative of limited community ambulator General Gait Details: Slow gait without device; supervision for safety. Further distance limited by c/o BLE fatigue  Stairs            Wheelchair Mobility    Modified Rankin (Stroke Patients Only)       Balance Overall balance assessment: Mild deficits observed, not formally tested                                            Pertinent Vitals/Pain Pain Assessment: No/denies pain    Home Living Family/patient expects to be discharged to:: Private residence Living Arrangements: Spouse/significant other Available Help at Discharge: Family Type of Home: Apartment Home Access: Level entry     Home Layout: One level Home Equipment: None Additional Comments: Husband works in Water engineer at [employer]    Prior Function Level of Independence: Independent               Journalist, newspaper        Extremity/Trunk Assessment   Upper Extremity Assessment Upper Extremity Assessment: Overall WFL for tasks assessed    Lower Extremity Assessment Lower Extremity Assessment: Overall WFL for tasks assessed       Communication   Communication: No difficulties  Cognition Arousal/Alertness: Awake/alert Behavior During Therapy: WFL for tasks assessed/performed Overall Cognitive Status: Within Functional Limits for tasks assessed                                 General Comments: WFL for simple tasks although agitated regarding wanting to leave       General Comments      Exercises     Assessment/Plan    PT Assessment Patent does not need any further PT services  PT Problem List  PT Treatment Interventions      PT Goals (Current goals can be found in the Care Plan section)  Acute Rehab PT Goals PT Goal Formulation: All assessment and education complete, DC therapy    Frequency     Barriers to discharge        Co-evaluation               AM-PAC PT "6 Clicks" Mobility  Outcome Measure Help needed turning from your back to your side while in a flat bed without using bedrails?: None Help needed moving from lying on your back to sitting on the side of a flat bed without using bedrails?: None Help needed moving to and from a bed to a chair (including a wheelchair)?: None Help needed standing up from a chair using your arms (e.g., wheelchair or bedside  chair)?: None Help needed to walk in hospital room?: None Help needed climbing 3-5 steps with a railing? : A Little 6 Click Score: 23    End of Session Equipment Utilized During Treatment: (declined gait belt) Activity Tolerance: Patient tolerated treatment well Patient left: in bed;with call bell/phone within reach Nurse Communication: Mobility status PT Visit Diagnosis: Other abnormalities of gait and mobility (R26.89)    Time: 3383-2919 PT Time Calculation (min) (ACUTE ONLY): 9 min   Charges:   PT Evaluation $PT Eval Moderate Complexity: Hatch, PT, DPT Acute Rehabilitation Services  Pager [phone] Office [place] 09/19/2018, 1:49 PM

## 2018-09-20 NOTE — Discharge Summary (Signed)
Physician Discharge Summary  Karen Morrow:[id number] DOB: [birth year] DOA: 09/15/2018  PCP: [name], MD  Admit date: 09/15/2018 Discharge date: 09/19/2018  Admitted From: Home.  Disposition:  Home.   Recommendations for Outpatient Follow-up:  1. Follow up with PCP in 1-2 weeks 2. Please obtain BMP/CBC in one week Please complete the antibiotic course as recommended on the days of HD.   Discharge Condition:GUARDED.  CODE STATUS: full code.  Diet recommendation: Heart Healthy  / renal diet.   Brief/Interim Summary: 58 year old female with PMH of ESRD on TTS HD, HTN, GERD, gastric ulcer, hepatitis C, asthma, chronic CHF, presented to [facility] ED via EMS on 09/15/2018 due to high fever, nonproductive cough, dyspnea and AMS which reportedly started on day of arrival.  Patient is a poor historian.  Met sepsis criteria in the ED without clear source.  COVID testing negative.  Empirically started on IV vancomycin and cefepime.  Sepsis improved.  Hemoglobin down to 4.8 which was in 9 g range in early April per nephrology along with unclear history of melena and blood in stool.  Transfused for anemia.  GI consulted and s/p EGD 4/19.  Nephrology consulted for dialysis needs.  ID automatically consulted for MSSA bacteremia.  Clinically improved. Discharge Diagnoses:  Principal Problem:   Sepsis ([place]) Active Problems:   Gastric ulcer   HTN (hypertension)   ESRD (end stage renal disease) (HCC)   Gastroesophageal reflux disease   Anemia in chronic kidney disease, on chronic dialysis (HCC)   Acute GI bleeding   Symptomatic anemia   Severe systemic inflammatory response syndrome (SIRS) (HCC)   Altered mental status   Tobacco abuse  1. MSSA bacteremia and sepsis: Likely due to [facility].  Met sepsis criteria on admission. RVP and SARS coronavirus 2 testing negative.  Blood cultures x2 from 4/17: Pansensitive MSSA.  Blood culture 4/18: Negative to date.  BCID 4/17 shows MSSA positive.  Chest x-ray  without acute findings.  Patient had left TDC in place on admission and an aVF which is ready to use.  TDC removed by IR on 4/19.  Empirically started on IV vancomycin and cefepime, as per ID recommendations cefepime continued and vancomycin discontinued.  Sepsis resolved.  Reportedly had severe thrombocytopenia to cefazolin. Plan for IV CEFEPIME till 10/02/2018.  2. Symptomatic anemia: Hemoglobin in the 9 range in early April per nephrology.  Down to 5.4 on admission >4.8 on 4/18.  After 3 units PRBCs, hemoglobin finally stabilized to 8.9 g.  Gives unclear history of melena and rectal bleed, could be due to GI bleeding from known chronic gastric ulcer.  GI consulted and s/p EGD 4/19 with results as below. 3. Suspected subacute upper GI bleed/gastric ulcer/GERD: Reviewed prior records and has had EGD last one in 2018 which showed gastric ulcer.  IV PPI started.  GI consulted and underwent EGD with results as below, reflux esophagitis, nonbleeding chronic gastric ulcer with no stigmata of bleeding.  Unclear reason why she had such substantial drop in her hemoglobin.?  Need for further work-up i.e. bleeding scan/colonoscopy.  I discussed in detail with Dr. Carlean Purl, [name], no plans for further evaluation at this time, continue to monitor hemoglobin closely, needs repeat EGD in 6 to 8 weeks to assess for ulcer healing and can have simultaneous colonoscopy at that time.  Likely will need lifelong PPI, switched Protonix to twice daily    4. ESRD on TTS HD: Nephrology consulted.  Status post HD 4/18.  Next HD on 4/21.  Plan to use new LUE AVF. 5. Essential hypertension: Controlled.  As per pharmacy update of home medications, patient is on metoprolol and not labetalol and Auryxia not PhosLo.  Continue metoprolol. 6. Acute encephalopathy: Resolved. 7. Tobacco abuse: Cessation counseled. 8. Hepatitis C: Outpatient follow-up. 9. Metabolic bone disease: Management per nephrology. 10. Thrombocytopenia: Could be  related to sepsis and blood transfusion.  Stable.  Follow CBC closely.   Discharge Instructions  Discharge Instructions    Diet - low sodium heart healthy   Complete by:  As directed    Discharge instructions   Complete by:  As directed    Please follow up with GI as recommended.  Please make sure you to to HD as recommended.     Allergies as of 09/19/2018      Reactions   Cefazolin Other (See Comments)   Severe thrombocytopenia (has tolerated zosyn in the past) ID aware Tolerated in October 2019      Medication List    TAKE these medications   acetaminophen 500 MG tablet Commonly known as:  TYLENOL Take 500-1,000 mg by mouth every 6 (six) hours as needed for headache (pain).   albuterol 108 (90 Base) MCG/ACT inhaler Commonly known as:  VENTOLIN HFA TAKE 2 PUFFS BY MOUTH EVERY 6 HOURS AS NEEDED FOR WHEEZE OR SHORTNESS OF BREATH What changed:  See the new instructions.   albuterol (2.5 MG/3ML) 0.083% nebulizer solution Commonly known as:  PROVENTIL Take 3 mLs (2.5 mg total) by nebulization every 6 (six) hours as needed for wheezing or shortness of breath. MUST MAKE APPT FOR FURTHER REFILLS What changed:  Another medication with the same name was changed. Make sure you understand how and when to take each.   amLODipine 10 MG tablet Commonly known as:  NORVASC Take 1 tablet (10 mg total) by mouth daily. What changed:  when to take this   Auryxia 1 GM 210 MG(Fe) tablet Generic drug:  ferric citrate Take 420 mg by mouth 3 (three) times daily with meals.   b complex-vitamin c-folic acid 0.8 MG Tabs tablet Take 1 tablet by mouth every evening.   calcium acetate 667 MG capsule Commonly known as:  PHOSLO Take 2 capsules (1,334 mg total) by mouth 3 (three) times daily with meals.   ceFEPIme 2 g in sodium chloride 0.9 % 100 mL Inject 2 g into the vein Every Tuesday,Thursday,and Saturday with dialysis for 11 days. Start taking on:  September 21, 2018   cinacalcet 30 MG  tablet Commonly known as:  SENSIPAR TAKE 1 TABLET BY MOUTH DAILY EVERY EVENING (DO NOT TAKE LESS THAN 12 HOURS PRIOR TO DIALYSIS)   feeding supplement (PRO-STAT SUGAR FREE 64) Liqd Take 30 mLs by mouth 2 (two) times daily.   metoprolol tartrate 25 MG tablet Commonly known as:  LOPRESSOR Take 25 mg by mouth 2 (two) times daily.   pantoprazole 40 MG tablet Commonly known as:  PROTONIX Take 1 tablet (40 mg total) by mouth 2 (two) times daily.      Follow-up Information    [name], MD. Schedule an appointment as soon as possible for a visit in 1 week(s).   Specialty:  Internal Medicine Contact information: [place] [address] [phone]        Gatha Mayer, MD. Schedule an appointment as soon as possible for a visit in 4 week(s).   Specialty:  Gastroenterology Contact information: [address] [place] [address] [phone]  Allergies  Allergen Reactions  . Cefazolin Other (See Comments)    Severe thrombocytopenia (has tolerated zosyn in the past) ID aware Tolerated in October 2019    Consultations:  Nephrology  ID   Procedures/Studies: Ir Removal Tun Cv Cath W/o Fl  Result Date: 09/17/2018 INDICATION: Sepsis. End-stage renal failure on hemodialysis. Request for removal of tunneled hemodialysis catheter. Per patient was initially placed 4 years ago and she does not know which facility. EXAM: REMOVAL OF TUNNELED HEMODIALYSIS CATHETER MEDICATIONS: 1% lidocaine 15 mL ANESTHESIA/SEDATION: No sedation medication given. COMPLICATIONS: None immediate. PROCEDURE: Informed written consent was obtained from the patient following an explanation of the procedure, risks, benefits and alternatives to treatment. A time out was performed prior to the initiation of the procedure. Maximal barrier sterile technique was utilized including mask, sterile gloves, sterile drape, hand hygiene, and ChloraPrep. 1% lidocaine was injected  along the tract and around the cuff. Attempt was made to remove the tunneled catheter with gentle traction and dissection however the catheter was extremely scarred down. The cuff was palpated about 3 inches above the exit site of the catheter. Using a 11. Blade a small incision was made over the cuff. Using scissors and hemostatic cuff was dissected out the catheter was removed. Next 2 interrupted 3-0 Ethilon sutures were used to approximate the skin at the incision site and 1 interrupted suture was used to approximate the incision made just above the exit site taking care to make sure the actual catheter exit site remained open to allow for drainage and prevent abscess formation. Dry gauze and a Tegaderm was placed over both sites. IMPRESSION: Successful removal of tunneled dialysis catheter. Read by: Gareth Eagle, PA-C Electronically Signed   By: Jacqulynn Cadet M.D.   On: 09/17/2018 12:54   Dg Chest Port 1 View  Result Date: 09/15/2018 CLINICAL DATA:  Shortness of breath fever EXAM: PORTABLE CHEST 1 VIEW COMPARISON:  05/13/2018 FINDINGS: Cardiac shadow is mildly enlarged. Dialysis catheter is again seen. The lungs are well aerated without focal infiltrate or sizable effusion. No bony abnormality is noted. IMPRESSION: No acute abnormality noted. Electronically Signed   By: Inez Catalina M.D.   On: 09/15/2018 21:54       Subjective: No chest pain or nausea, vomiting or abdominal pain.   Discharge Exam: Vitals:   09/19/18 1127 09/19/18 1147  BP: 136/85 138/88  Pulse: 86 81  Resp: 20 20  Temp: (!) 97.5 F (36.4 C) 98.4 F (36.9 C)  SpO2: 98% 100%   Vitals:   09/19/18 1030 09/19/18 1100 09/19/18 1127 09/19/18 1147  BP: 129/84 127/82 136/85 138/88  Pulse: 82 86 86 81  Resp:   20 20  Temp:   (!) 97.5 F (36.4 C) 98.4 F (36.9 C)  TempSrc:   Oral Oral  SpO2:   98% 100%  Weight:   52.6 kg 55.3 kg  Height:        General: Pt is alert, awake, not in acute distress Cardiovascular: RRR,  S1/S2 +, no rubs, no gallops Respiratory: CTA bilaterally, no wheezing, no rhonchi Abdominal: Soft, NT, ND, bowel sounds + Extremities: no edema, no cyanosis    The results of significant diagnostics from this hospitalization (including imaging, microbiology, ancillary and laboratory) are listed below for reference.     Microbiology: Recent Results (from the past 240 hour(s))  SARS Coronavirus 2 [facility] order, Performed in [place] hospital lab)     Status: None   Collection Time: 09/15/18  9:12 PM  Result Value Ref Range Status   SARS Coronavirus 2 NEGATIVE NEGATIVE Final    Comment: (NOTE) If result is NEGATIVE SARS-CoV-2 target nucleic acids are NOT DETECTED. The SARS-CoV-2 RNA is generally detectable in upper and lower  respiratory specimens during the acute phase of infection. The lowest  concentration of SARS-CoV-2 viral copies this assay can detect is 250  copies / mL. A negative result does not preclude SARS-CoV-2 infection  and should not be used as the sole basis for treatment or other  patient management decisions.  A negative result may occur with  improper specimen collection / handling, submission of specimen other  than nasopharyngeal swab, presence of viral mutation(s) within the  areas targeted by this assay, and inadequate number of viral copies  (<250 copies / mL). A negative result must be combined with clinical  observations, patient history, and epidemiological information. If result is POSITIVE SARS-CoV-2 target nucleic acids are DETECTED. The SARS-CoV-2 RNA is generally detectable in upper and lower  respiratory specimens dur ing the acute phase of infection.  Positive  results are indicative of active infection with SARS-CoV-2.  Clinical  correlation with patient history and other diagnostic information is  necessary to determine patient infection status.  Positive results do  not rule out bacterial infection or co-infection with other viruses. If  result is PRESUMPTIVE POSTIVE SARS-CoV-2 nucleic acids MAY BE PRESENT.   A presumptive positive result was obtained on the submitted specimen  and confirmed on repeat testing.  While 2019 novel coronavirus  (SARS-CoV-2) nucleic acids may be present in the submitted sample  additional confirmatory testing may be necessary for epidemiological  and / or clinical management purposes  to differentiate between  SARS-CoV-2 and other Sarbecovirus currently known to infect humans.  If clinically indicated additional testing with an alternate test  methodology [phone]) is advised. The SARS-CoV-2 RNA is generally  detectable in upper and lower respiratory sp ecimens during the acute  phase of infection. The expected result is Negative. Fact Sheet for Patients:  StrictlyIdeas.no Fact Sheet for Healthcare Providers: BankingDealers.co.za This test is not yet approved or cleared by the [place] FDA and has been authorized for detection and/or diagnosis of SARS-CoV-2 by FDA under an Emergency Use Authorization (EUA).  This EUA will remain in effect (meaning this test can be used) for the duration of the COVID-19 declaration under Section 564(b)(1) of the Act, 21 U.S.C. section 360bbb-3(b)(1), unless the authorization is terminated or revoked sooner. Performed at [facility], [place] [address] [place],  [address]   Respiratory Panel by PCR     Status: None   Collection Time: 09/15/18  9:12 PM  Result Value Ref Range Status   Adenovirus NOT DETECTED NOT DETECTED Final   Coronavirus 229E NOT DETECTED NOT DETECTED Final    Comment: (NOTE) The Coronavirus on the Respiratory Panel, DOES NOT test for the novel  Coronavirus (2019 nCoV)    Coronavirus HKU1 NOT DETECTED NOT DETECTED Final   Coronavirus NL63 NOT DETECTED NOT DETECTED Final   Coronavirus OC43 NOT DETECTED NOT DETECTED Final   Metapneumovirus NOT DETECTED NOT DETECTED Final    Rhinovirus / Enterovirus NOT DETECTED NOT DETECTED Final   Influenza A NOT DETECTED NOT DETECTED Final   Influenza B NOT DETECTED NOT DETECTED Final   Parainfluenza Virus 1 NOT DETECTED NOT DETECTED Final   Parainfluenza Virus 2 NOT DETECTED NOT DETECTED Final   Parainfluenza Virus 3 NOT DETECTED NOT DETECTED Final   Parainfluenza Virus  4 NOT DETECTED NOT DETECTED Final   Respiratory Syncytial Virus NOT DETECTED NOT DETECTED Final   Bordetella pertussis NOT DETECTED NOT DETECTED Final   Chlamydophila pneumoniae NOT DETECTED NOT DETECTED Final   Mycoplasma pneumoniae NOT DETECTED NOT DETECTED Final    Comment: Performed at [facility], [place] [address], [place], [place] [address]  Blood Culture (routine x 2)     Status: Abnormal   Collection Time: 09/15/18  9:30 PM  Result Value Ref Range Status   Specimen Description BLOOD RIGHT HAND  Final   Special Requests   Final    BOTTLES DRAWN AEROBIC AND ANAEROBIC Blood Culture adequate volume   Culture  Setup Time   Final    GRAM POSITIVE COCCI IN CLUSTERS IN BOTH AEROBIC AND ANAEROBIC BOTTLES CRITICAL VALUE NOTED.  VALUE IS CONSISTENT WITH PREVIOUSLY REPORTED AND CALLED VALUE.    Culture (A)  Final    STAPHYLOCOCCUS AUREUS SUSCEPTIBILITIES PERFORMED ON PREVIOUS CULTURE WITHIN THE LAST 5 DAYS. Performed at [facility], [place] [address] [place], [place] [address]    Report Status 09/18/2018 FINAL  Final  Blood Culture (routine x 2)     Status: Abnormal   Collection Time: 09/15/18  9:38 PM  Result Value Ref Range Status   Specimen Description BLOOD LEFT LOWER ARM  Final   Special Requests   Final    BOTTLES DRAWN AEROBIC AND ANAEROBIC Blood Culture adequate volume   Culture  Setup Time   Final    GRAM POSITIVE COCCI IN CLUSTERS IN BOTH AEROBIC AND ANAEROBIC BOTTLES CRITICAL RESULT CALLED TO, READ BACK BY AND VERIFIED WITH: T. DANG, PHARMD AT 1220 ON 09/16/18 BY C. JESSUP, MLT. Performed at [facility], [place] [address] [place], [place] [address]    Culture STAPHYLOCOCCUS AUREUS (A)  Final   Report Status 09/18/2018 FINAL  Final   Organism ID, Bacteria STAPHYLOCOCCUS AUREUS  Final      Susceptibility   Staphylococcus aureus - MIC*    CIPROFLOXACIN 1 SENSITIVE Sensitive     ERYTHROMYCIN 0.5 SENSITIVE Sensitive     GENTAMICIN <=0.5 SENSITIVE Sensitive     OXACILLIN 0.5 SENSITIVE Sensitive     TETRACYCLINE <=1 SENSITIVE Sensitive     VANCOMYCIN 1 SENSITIVE Sensitive     TRIMETH/SULFA <=10 SENSITIVE Sensitive     CLINDAMYCIN <=0.25 SENSITIVE Sensitive     RIFAMPIN <=0.5 SENSITIVE Sensitive     Inducible Clindamycin NEGATIVE Sensitive     * STAPHYLOCOCCUS AUREUS  Blood Culture ID Panel (Reflexed)     Status: Abnormal   Collection Time: 09/15/18  9:38 PM  Result Value Ref Range Status   Enterococcus species NOT DETECTED NOT DETECTED Final   Listeria monocytogenes NOT DETECTED NOT DETECTED Final   Staphylococcus species DETECTED (A) NOT DETECTED Final    Comment: CRITICAL RESULT CALLED TO, READ BACK BY AND VERIFIED WITH: T. DANG, PHARMD AT 1220 ON 09/16/18 BY C. JESSUP, MLT.    Staphylococcus aureus (BCID) DETECTED (A) NOT DETECTED Final    Comment: Methicillin (oxacillin) susceptible Staphylococcus aureus (MSSA). Preferred therapy is anti staphylococcal beta lactam antibiotic (Cefazolin or Nafcillin), unless clinically contraindicated. CRITICAL RESULT CALLED TO, READ BACK BY AND VERIFIED WITH: T. DANG, PHARMD AT 1220 ON 09/16/18 BY C. JESSUP, MLT.    Methicillin resistance NOT DETECTED NOT DETECTED Final   Streptococcus species NOT DETECTED NOT DETECTED Final   Streptococcus agalactiae NOT DETECTED NOT DETECTED Final   Streptococcus pneumoniae NOT DETECTED NOT  DETECTED Final   Streptococcus pyogenes NOT DETECTED NOT DETECTED Final   Acinetobacter baumannii NOT DETECTED NOT DETECTED Final   Enterobacteriaceae species NOT DETECTED NOT DETECTED Final   Enterobacter cloacae complex NOT DETECTED NOT  DETECTED Final   Escherichia coli NOT DETECTED NOT DETECTED Final   Klebsiella oxytoca NOT DETECTED NOT DETECTED Final   Klebsiella pneumoniae NOT DETECTED NOT DETECTED Final   Proteus species NOT DETECTED NOT DETECTED Final   Serratia marcescens NOT DETECTED NOT DETECTED Final   Haemophilus influenzae NOT DETECTED NOT DETECTED Final   Neisseria meningitidis NOT DETECTED NOT DETECTED Final   Pseudomonas aeruginosa NOT DETECTED NOT DETECTED Final   Candida albicans NOT DETECTED NOT DETECTED Final   Candida glabrata NOT DETECTED NOT DETECTED Final   Candida krusei NOT DETECTED NOT DETECTED Final   Candida parapsilosis NOT DETECTED NOT DETECTED Final   Candida tropicalis NOT DETECTED NOT DETECTED Final    Comment: Performed at [facility], [place] [address] [place], [place] [address]  Culture, blood (single)     Status: None (Preliminary result)   Collection Time: 09/16/18  2:52 AM  Result Value Ref Range Status   Specimen Description BLOOD RIGHT HAND  Final   Special Requests   Final    BOTTLES DRAWN AEROBIC ONLY Blood Culture adequate volume   Culture   Final    NO GROWTH 3 DAYS Performed at [facility], [place] [address] [place], [place] [address]    Report Status PENDING  Incomplete     Labs: BNP (last 3 results) Recent Labs    05/13/18 0539 09/15/18 2126  BNP >4,500.0* 0,923.3*   Basic Metabolic Panel: Recent Labs  Lab 09/15/18 2153 09/15/18 2204 09/16/18 0252 09/19/18 0752  NA 135 133* 135 134*  K 4.1 4.0 4.0 3.4*  CL 99  --  98 97*  CO2 20*  --  21* 23  GLUCOSE 87  --  94 87  BUN 65*  --  70* 37*  CREATININE 9.73*  --  9.84* 6.52*  CALCIUM 9.1  --  9.0 8.4*  MG 1.8  --   --   --   PHOS 2.7  --   --  1.9*   Liver Function Tests: Recent Labs  Lab 09/15/18 2153 09/16/18 0252 09/19/18 0752  AST 29 28  --   ALT 20 18  --   ALKPHOS 64 57  --   BILITOT 0.8 0.9  --   PROT 5.5* 5.3*  --   ALBUMIN 2.6* 2.4* 2.3*   Recent Labs  Lab 09/15/18 2153   LIPASE 34   Recent Labs  Lab 09/15/18 2153  AMMONIA 19   CBC: Recent Labs  Lab 09/15/18 2153  09/16/18 0906 09/16/18 1907 09/17/18 0247 09/17/18 1801 09/18/18 0312 09/19/18 0509  WBC 8.9   < > 12.0* 8.3 6.2  --  6.1 8.3  NEUTROABS 7.9*  --   --   --   --   --   --   --   HGB 5.3*   < > 6.1* 7.5* 6.6* 8.9* 8.9* 9.5*  HCT 16.8*   < > 17.3* 22.3* 19.9* 26.1* 25.9* 27.5*  MCV 93.3   < > 84.0 84.5 86.5  --  87.5 87.9  PLT 168   < > 150 144* 122*  --  120* 114*   < > = values in this interval not displayed.   Cardiac Enzymes: No results for input(s): CKTOTAL, CKMB, CKMBINDEX, TROPONINI  in the last 168 hours. BNP: Invalid input(s): POCBNP CBG: No results for input(s): GLUCAP in the last 168 hours. D-Dimer No results for input(s): DDIMER in the last 72 hours. Hgb A1c No results for input(s): HGBA1C in the last 72 hours. Lipid Profile No results for input(s): CHOL, HDL, LDLCALC, TRIG, CHOLHDL, LDLDIRECT in the last 72 hours. Thyroid function studies No results for input(s): TSH, T4TOTAL, T3FREE, THYROIDAB in the last 72 hours.  Invalid input(s): FREET3 Anemia work up No results for input(s): VITAMINB12, FOLATE, FERRITIN, TIBC, IRON, RETICCTPCT in the last 72 hours. Urinalysis    Component Value Date/Time   COLORURINE YELLOW 11/26/2013 1819   APPEARANCEUR TURBID (A) 11/26/2013 1819   LABSPEC 1.021 11/26/2013 1819   PHURINE 5.5 11/26/2013 1819   GLUCOSEU NEGATIVE 11/26/2013 1819   HGBUR SMALL (A) 11/26/2013 1819   BILIRUBINUR NEGATIVE 11/26/2013 1819   KETONESUR NEGATIVE 11/26/2013 1819   PROTEINUR >300 (A) 11/26/2013 1819   UROBILINOGEN 0.2 11/26/2013 1819   NITRITE POSITIVE (A) 11/26/2013 1819   LEUKOCYTESUR MODERATE (A) 11/26/2013 1819   Sepsis Labs Invalid input(s): PROCALCITONIN,  WBC,  LACTICIDVEN Microbiology Recent Results (from the past 240 hour(s))  SARS Coronavirus 2 [facility] order, Performed in [place] hospital lab)     Status: None   Collection  Time: 09/15/18  9:12 PM  Result Value Ref Range Status   SARS Coronavirus 2 NEGATIVE NEGATIVE Final    Comment: (NOTE) If result is NEGATIVE SARS-CoV-2 target nucleic acids are NOT DETECTED. The SARS-CoV-2 RNA is generally detectable in upper and lower  respiratory specimens during the acute phase of infection. The lowest  concentration of SARS-CoV-2 viral copies this assay can detect is 250  copies / mL. A negative result does not preclude SARS-CoV-2 infection  and should not be used as the sole basis for treatment or other  patient management decisions.  A negative result may occur with  improper specimen collection / handling, submission of specimen other  than nasopharyngeal swab, presence of viral mutation(s) within the  areas targeted by this assay, and inadequate number of viral copies  (<250 copies / mL). A negative result must be combined with clinical  observations, patient history, and epidemiological information. If result is POSITIVE SARS-CoV-2 target nucleic acids are DETECTED. The SARS-CoV-2 RNA is generally detectable in upper and lower  respiratory specimens dur ing the acute phase of infection.  Positive  results are indicative of active infection with SARS-CoV-2.  Clinical  correlation with patient history and other diagnostic information is  necessary to determine patient infection status.  Positive results do  not rule out bacterial infection or co-infection with other viruses. If result is PRESUMPTIVE POSTIVE SARS-CoV-2 nucleic acids MAY BE PRESENT.   A presumptive positive result was obtained on the submitted specimen  and confirmed on repeat testing.  While 2019 novel coronavirus  (SARS-CoV-2) nucleic acids may be present in the submitted sample  additional confirmatory testing may be necessary for epidemiological  and / or clinical management purposes  to differentiate between  SARS-CoV-2 and other Sarbecovirus currently known to infect humans.  If  clinically indicated additional testing with an alternate test  methodology [phone]) is advised. The SARS-CoV-2 RNA is generally  detectable in upper and lower respiratory sp ecimens during the acute  phase of infection. The expected result is Negative. Fact Sheet for Patients:  StrictlyIdeas.no Fact Sheet for Healthcare Providers: BankingDealers.co.za This test is not yet approved or cleared by the [place] FDA and has been  authorized for detection and/or diagnosis of SARS-CoV-2 by FDA under an Emergency Use Authorization (EUA).  This EUA will remain in effect (meaning this test can be used) for the duration of the COVID-19 declaration under Section 564(b)(1) of the Act, 21 U.S.C. section 360bbb-3(b)(1), unless the authorization is terminated or revoked sooner. Performed at [facility], [place] [address] [place], [place] [address]   Respiratory Panel by PCR     Status: None   Collection Time: 09/15/18  9:12 PM  Result Value Ref Range Status   Adenovirus NOT DETECTED NOT DETECTED Final   Coronavirus 229E NOT DETECTED NOT DETECTED Final    Comment: (NOTE) The Coronavirus on the Respiratory Panel, DOES NOT test for the novel  Coronavirus (2019 nCoV)    Coronavirus HKU1 NOT DETECTED NOT DETECTED Final   Coronavirus NL63 NOT DETECTED NOT DETECTED Final   Coronavirus OC43 NOT DETECTED NOT DETECTED Final   Metapneumovirus NOT DETECTED NOT DETECTED Final   Rhinovirus / Enterovirus NOT DETECTED NOT DETECTED Final   Influenza A NOT DETECTED NOT DETECTED Final   Influenza B NOT DETECTED NOT DETECTED Final   Parainfluenza Virus 1 NOT DETECTED NOT DETECTED Final   Parainfluenza Virus 2 NOT DETECTED NOT DETECTED Final   Parainfluenza Virus 3 NOT DETECTED NOT DETECTED Final   Parainfluenza Virus 4 NOT DETECTED NOT DETECTED Final   Respiratory Syncytial Virus NOT DETECTED NOT DETECTED Final   Bordetella pertussis NOT DETECTED NOT  DETECTED Final   Chlamydophila pneumoniae NOT DETECTED NOT DETECTED Final   Mycoplasma pneumoniae NOT DETECTED NOT DETECTED Final    Comment: Performed at [facility], [place]. [address] [place], [place] [address]  Blood Culture (routine x 2)     Status: Abnormal   Collection Time: 09/15/18  9:30 PM  Result Value Ref Range Status   Specimen Description BLOOD RIGHT HAND  Final   Special Requests   Final    BOTTLES DRAWN AEROBIC AND ANAEROBIC Blood Culture adequate volume   Culture  Setup Time   Final    GRAM POSITIVE COCCI IN CLUSTERS IN BOTH AEROBIC AND ANAEROBIC BOTTLES CRITICAL VALUE NOTED.  VALUE IS CONSISTENT WITH PREVIOUSLY REPORTED AND CALLED VALUE.    Culture (A)  Final    STAPHYLOCOCCUS AUREUS SUSCEPTIBILITIES PERFORMED ON PREVIOUS CULTURE WITHIN THE LAST 5 DAYS. Performed at [facility], [place] [address] [place], [place] [address]    Report Status 09/18/2018 FINAL  Final  Blood Culture (routine x 2)     Status: Abnormal   Collection Time: 09/15/18  9:38 PM  Result Value Ref Range Status   Specimen Description BLOOD LEFT LOWER ARM  Final   Special Requests   Final    BOTTLES DRAWN AEROBIC AND ANAEROBIC Blood Culture adequate volume   Culture  Setup Time   Final    GRAM POSITIVE COCCI IN CLUSTERS IN BOTH AEROBIC AND ANAEROBIC BOTTLES CRITICAL RESULT CALLED TO, READ BACK BY AND VERIFIED WITH: T. DANG, PHARMD AT 1220 ON 09/16/18 BY C. JESSUP, MLT. Performed at [facility], [place] [address] [place], [place] [address]    Culture STAPHYLOCOCCUS AUREUS (A)  Final   Report Status 09/18/2018 FINAL  Final   Organism ID, Bacteria STAPHYLOCOCCUS AUREUS  Final      Susceptibility   Staphylococcus aureus - MIC*    CIPROFLOXACIN 1 SENSITIVE Sensitive     ERYTHROMYCIN 0.5 SENSITIVE Sensitive     GENTAMICIN <=0.5 SENSITIVE Sensitive  OXACILLIN 0.5 SENSITIVE Sensitive     TETRACYCLINE <=1 SENSITIVE Sensitive     VANCOMYCIN 1 SENSITIVE Sensitive     TRIMETH/SULFA  <=10 SENSITIVE Sensitive     CLINDAMYCIN <=0.25 SENSITIVE Sensitive     RIFAMPIN <=0.5 SENSITIVE Sensitive     Inducible Clindamycin NEGATIVE Sensitive     * STAPHYLOCOCCUS AUREUS  Blood Culture ID Panel (Reflexed)     Status: Abnormal   Collection Time: 09/15/18  9:38 PM  Result Value Ref Range Status   Enterococcus species NOT DETECTED NOT DETECTED Final   Listeria monocytogenes NOT DETECTED NOT DETECTED Final   Staphylococcus species DETECTED (A) NOT DETECTED Final    Comment: CRITICAL RESULT CALLED TO, READ BACK BY AND VERIFIED WITH: T. DANG, PHARMD AT 1220 ON 09/16/18 BY C. JESSUP, MLT.    Staphylococcus aureus (BCID) DETECTED (A) NOT DETECTED Final    Comment: Methicillin (oxacillin) susceptible Staphylococcus aureus (MSSA). Preferred therapy is anti staphylococcal beta lactam antibiotic (Cefazolin or Nafcillin), unless clinically contraindicated. CRITICAL RESULT CALLED TO, READ BACK BY AND VERIFIED WITH: T. DANG, PHARMD AT 1220 ON 09/16/18 BY C. JESSUP, MLT.    Methicillin resistance NOT DETECTED NOT DETECTED Final   Streptococcus species NOT DETECTED NOT DETECTED Final   Streptococcus agalactiae NOT DETECTED NOT DETECTED Final   Streptococcus pneumoniae NOT DETECTED NOT DETECTED Final   Streptococcus pyogenes NOT DETECTED NOT DETECTED Final   Acinetobacter baumannii NOT DETECTED NOT DETECTED Final   Enterobacteriaceae species NOT DETECTED NOT DETECTED Final   Enterobacter cloacae complex NOT DETECTED NOT DETECTED Final   Escherichia coli NOT DETECTED NOT DETECTED Final   Klebsiella oxytoca NOT DETECTED NOT DETECTED Final   Klebsiella pneumoniae NOT DETECTED NOT DETECTED Final   Proteus species NOT DETECTED NOT DETECTED Final   Serratia marcescens NOT DETECTED NOT DETECTED Final   Haemophilus influenzae NOT DETECTED NOT DETECTED Final   Neisseria meningitidis NOT DETECTED NOT DETECTED Final   Pseudomonas aeruginosa NOT DETECTED NOT DETECTED Final   Candida albicans NOT  DETECTED NOT DETECTED Final   Candida glabrata NOT DETECTED NOT DETECTED Final   Candida krusei NOT DETECTED NOT DETECTED Final   Candida parapsilosis NOT DETECTED NOT DETECTED Final   Candida tropicalis NOT DETECTED NOT DETECTED Final    Comment: Performed at [facility], [place] [address] [place], [place] [address]  Culture, blood (single)     Status: None (Preliminary result)   Collection Time: 09/16/18  2:52 AM  Result Value Ref Range Status   Specimen Description BLOOD RIGHT HAND  Final   Special Requests   Final    BOTTLES DRAWN AEROBIC ONLY Blood Culture adequate volume   Culture   Final    NO GROWTH 3 DAYS Performed at [facility], [place] [address], [place], [place] [address]    Report Status PENDING  Incomplete     Time coordinating discharge: 32  minutes  SIGNED:   Hosie Poisson, MD  [facility] 09/20/2018, 10:56 AM Pager   If 7PM-7AM, please contact night-coverage [email or web address] Password [email or web address]

## 2018-09-21 ENCOUNTER — Ambulatory Visit: Payer: Medicare Other | Admitting: Family

## 2018-09-21 ENCOUNTER — Other Ambulatory Visit: Payer: Self-pay | Admitting: *Deleted

## 2018-09-21 DIAGNOSIS — K7460 Unspecified cirrhosis of liver: Secondary | ICD-10-CM | POA: Diagnosis not present

## 2018-09-21 DIAGNOSIS — N186 End stage renal disease: Secondary | ICD-10-CM | POA: Diagnosis not present

## 2018-09-21 DIAGNOSIS — D631 Anemia in chronic kidney disease: Secondary | ICD-10-CM | POA: Diagnosis not present

## 2018-09-21 DIAGNOSIS — A4901 Methicillin susceptible Staphylococcus aureus infection, unspecified site: Secondary | ICD-10-CM | POA: Diagnosis not present

## 2018-09-21 DIAGNOSIS — Z992 Dependence on renal dialysis: Secondary | ICD-10-CM | POA: Diagnosis not present

## 2018-09-21 DIAGNOSIS — N2581 Secondary hyperparathyroidism of renal origin: Secondary | ICD-10-CM | POA: Diagnosis not present

## 2018-09-21 LAB — HCV RNA QUANT
HCV Quantitative Log: 6.386 log10 IU/mL (ref >1.70)
HCV Quantitative: 2430000 IU/mL (ref >50)

## 2018-09-21 LAB — CULTURE, BLOOD (SINGLE)
Culture: NO GROWTH
Special Requests: ADEQUATE

## 2018-09-21 NOTE — Patient Outreach (Addendum)
[facility]) Care Management  09/21/2018  Karen Morrow [birth year] [id number]   Transition of care telephone call  Referral received: 09/18/18 Initial outreach: 09/21/18 Insurance: [place] [employer]   Subjective: Initial unsuccessful telephone call to patient's preferred (mobile) number in order to complete transition of care assessment; received a recording stating the call could not go through as dialed. Verified that number was entered correctly then call placed to patient's home number.  No answer, left HIPAA compliant voicemail message requesting return call.   Objective: Per the electronic medical record, Karen Morrow  was hospitalized at [facility] from 4/17-4/21 for Methicillin-sensitive Staphylococcus aureus sepsis likely due to her dialysis catheter and symptomatic anemia with suspected subacute upper GI bleed. Comorbidities include: CHF, HTN,cirrhosis of liver, GERD, gastric ulcer, ESRD on HD, Hepatitis C, malnutrition, and tobacco abuse.  She was discharged to home on 09/19/18. She is to receive cefepime 2 gm IV at dialysis on Tuesday, Thursday and Saturday for 11 days.    Plan: This RNCM will route unsuccessful outreach letter with [facility] Management pamphlet and 24 hour Nurse Advice Line Magnet to [facility] clinical pool to be mailed to patient's home address. This RNCM will attempt another outreach within 4 business days.  Barrington Ellison RN,CCM,CDE [place] Management Coordinator Office Phone [phone] Office Fax [phone]

## 2018-09-22 ENCOUNTER — Other Ambulatory Visit: Payer: Self-pay | Admitting: Family Medicine

## 2018-09-22 DIAGNOSIS — B182 Chronic viral hepatitis C: Secondary | ICD-10-CM

## 2018-09-23 ENCOUNTER — Encounter: Payer: Self-pay | Admitting: Internal Medicine

## 2018-09-23 DIAGNOSIS — Z992 Dependence on renal dialysis: Secondary | ICD-10-CM | POA: Diagnosis not present

## 2018-09-23 DIAGNOSIS — A4901 Methicillin susceptible Staphylococcus aureus infection, unspecified site: Secondary | ICD-10-CM | POA: Diagnosis not present

## 2018-09-23 DIAGNOSIS — N186 End stage renal disease: Secondary | ICD-10-CM | POA: Diagnosis not present

## 2018-09-23 DIAGNOSIS — N2581 Secondary hyperparathyroidism of renal origin: Secondary | ICD-10-CM | POA: Diagnosis not present

## 2018-09-23 DIAGNOSIS — B182 Chronic viral hepatitis C: Secondary | ICD-10-CM | POA: Insufficient documentation

## 2018-09-23 DIAGNOSIS — K7460 Unspecified cirrhosis of liver: Secondary | ICD-10-CM | POA: Diagnosis not present

## 2018-09-23 DIAGNOSIS — D631 Anemia in chronic kidney disease: Secondary | ICD-10-CM | POA: Diagnosis not present

## 2018-09-25 ENCOUNTER — Other Ambulatory Visit: Payer: Self-pay

## 2018-09-25 ENCOUNTER — Other Ambulatory Visit: Payer: Self-pay | Admitting: *Deleted

## 2018-09-25 ENCOUNTER — Ambulatory Visit (INDEPENDENT_AMBULATORY_CARE_PROVIDER_SITE_OTHER): Payer: Medicare Other | Admitting: Family

## 2018-09-25 ENCOUNTER — Telehealth: Payer: Self-pay | Admitting: *Deleted

## 2018-09-25 ENCOUNTER — Encounter: Payer: Self-pay | Admitting: *Deleted

## 2018-09-25 ENCOUNTER — Encounter (HOSPITAL_COMMUNITY): Payer: Self-pay | Admitting: *Deleted

## 2018-09-25 ENCOUNTER — Encounter: Payer: Self-pay | Admitting: Family

## 2018-09-25 VITALS — BP 162/93 | HR 92 | Temp 97.2°F | Resp 16 | Ht 67.0 in | Wt 124.0 lb

## 2018-09-25 DIAGNOSIS — I770 Arteriovenous fistula, acquired: Secondary | ICD-10-CM | POA: Diagnosis not present

## 2018-09-25 DIAGNOSIS — F17200 Nicotine dependence, unspecified, uncomplicated: Secondary | ICD-10-CM | POA: Diagnosis not present

## 2018-09-25 DIAGNOSIS — M79622 Pain in left upper arm: Secondary | ICD-10-CM

## 2018-09-25 DIAGNOSIS — M7989 Other specified soft tissue disorders: Secondary | ICD-10-CM

## 2018-09-25 DIAGNOSIS — N186 End stage renal disease: Secondary | ICD-10-CM

## 2018-09-25 DIAGNOSIS — Z992 Dependence on renal dialysis: Secondary | ICD-10-CM

## 2018-09-25 NOTE — Progress Notes (Signed)
Spoke with pt for pre-op call. Pt has hx of angina, states she has not had any recent chest pain or sob. Pt states she is not diabetic. Instructed pt not to smoke as of now until after surgery.     Coronavirus Screening  Have you experienced the following symptoms:  Cough NO Fever (>100.17F) NO Runny nose NO Sore throat NO Difficulty breathing/shortness of breath NO  Have you or a family member traveled in the last 14 days and where? NO     Patient notified that hospital visitation restrictions are in effect and the importance of the restrictions. She states her husband works here and I explained to her that if he is not scheduled to work then he cannot come in with her. She voiced understanding.

## 2018-09-25 NOTE — Telephone Encounter (Signed)
-----   Message from Janine Ores, MD sent at 09/23/2018 12:30 AM EDT ----- We recently treated this patient for an MSSA bacteremia as an inpt. She does NOT need f/u for that (we'll just follow her labs), but can you schedule her on my schedule in about 2 months, so we can work up her hepatitis C and see about getting her on treatment? She's fine to go on my schedule at that time. I want her to have some distance from her current infection before we pursue this. Thanks

## 2018-09-25 NOTE — Patient Instructions (Signed)
Steps to Quit Smoking    Smoking tobacco can be bad for your health. It can also affect almost every organ in your body. Smoking puts you and people around you at risk for many serious long-lasting (chronic) diseases. Quitting smoking is hard, but it is one of the best things that you can do for your health. It is never too late to quit.  What are the benefits of quitting smoking?  When you quit smoking, you lower your risk for getting serious diseases and conditions. They can include:  · Lung cancer or lung disease.  · Heart disease.  · Stroke.  · Heart attack.  · Not being able to have children (infertility).  · Weak bones (osteoporosis) and broken bones (fractures).  If you have coughing, wheezing, and shortness of breath, those symptoms may get better when you quit. You may also get sick less often. If you are pregnant, quitting smoking can help to lower your chances of having a baby of low birth weight.  What can I do to help me quit smoking?  Talk with your doctor about what can help you quit smoking. Some things you can do (strategies) include:  · Quitting smoking totally, instead of slowly cutting back how much you smoke over a period of time.  · Going to in-person counseling. You are more likely to quit if you go to many counseling sessions.  · Using resources and support systems, such as:  ? Online chats with a counselor.  ? Phone quitlines.  ? Printed self-help materials.  ? Support groups or group counseling.  ? Text messaging programs.  ? Mobile phone apps or applications.  · Taking medicines. Some of these medicines may have nicotine in them. If you are pregnant or breastfeeding, do not take any medicines to quit smoking unless your doctor says it is okay. Talk with your doctor about counseling or other things that can help you.  Talk with your doctor about using more than one strategy at the same time, such as taking medicines while you are also going to in-person counseling. This can help make  quitting easier.  What things can I do to make it easier to quit?  Quitting smoking might feel very hard at first, but there is a lot that you can do to make it easier. Take these steps:  · Talk to your family and friends. Ask them to support and encourage you.  · Call phone quitlines, reach out to support groups, or work with a counselor.  · Ask people who smoke to not smoke around you.  · Avoid places that make you want (trigger) to smoke, such as:  ? Bars.  ? Parties.  ? Smoke-break areas at work.  · Spend time with people who do not smoke.  · Lower the stress in your life. Stress can make you want to smoke. Try these things to help your stress:  ? Getting regular exercise.  ? Deep-breathing exercises.  ? Yoga.  ? Meditating.  ? Doing a body scan. To do this, close your eyes, focus on one area of your body at a time from head to toe, and notice which parts of your body are tense. Try to relax the muscles in those areas.  · Download or buy apps on your mobile phone or tablet that can help you stick to your quit plan. There are many free apps, such as QuitGuide from the CDC (Centers for Disease Control and Prevention). You can find more   support from smokefree.gov and other websites.  This information is not intended to replace advice given to you by your health care provider. Make sure you discuss any questions you have with your health care provider.  Document Released: 03/13/2009 Document Revised: 01/13/2016 Document Reviewed: 10/01/2014  Elsevier Interactive Patient Education © 2019 Elsevier Inc.

## 2018-09-25 NOTE — H&P (View-Only) (Signed)
CC: Left arm swelling and pain  For a few days, possibly from AVF access and infiltration  History of Present Illness  Karen Morrow is a 58 y.o. (1960/08/10) female who is s/p left brachiocephalic arteriovenous fistula creation by Dr. Donzetta Matters (Date: 05/17/18).    Pt was last evaluated on 06-30-18 by M. Eveland PA-C. At that time L brachiocephalic fistula was patent; no signs or symptoms of steal. Fistula should continue to mature over the next 4-6 weeks At that time will recheck fistula duplex prior to allow fistula to be used on HD Pt was to continue HD via [facility] until that time.   Dialysis duplex was performed on 08-11-18.  Her TDC was removed last week while she was hospitalized (on 09-15-18) for altered mental status, sepsis, and GI bleed.   Pt dialyzes T-TH-S via left arm AVF at [facility], [place].   Pt states her left arm AVF was accessed for the first time about 2 weeks ago, pt states accessing infiltrated about the last time or two that it was accessed, and her left arm is swollen and she states "very painful" in her left arm.  She sates she has been applying cold packs to her left arm to help decrease the swelling, as advised by the personnel at HD, states no improvement. Pt is requesting medication for pain; I advised her to consult her nephrologist re this.   She states HD access last week on Tuesday was fine, states the infiltration occurred last Thursday. She does not know if she had HD on Saturday, states she probably did.   She is left hand dominant. She states she had an access in her right arm that became infected and was excised.    She states she has trouble walking, is vague as to why she thinks this is so.   She states that she smokes about 3 cigarettes/ day. She denies ETOH use.    Past Medical History:  Diagnosis Date  . Anginal pain ([place])   . Asthma   . CHF (congestive heart failure) ([place])   . Chronic kidney disease    dialysis 3x wk  . Dyspnea    . Frequent bowel movements   . GERD (gastroesophageal reflux disease)   . Hepatitis C antibody test positive   . Hypertension    "just dx'd today" (11/03/2012)    Social History Social History   Tobacco Use  . Smoking status: Light Tobacco Smoker    Packs/day: 0.50    Years: 20.00    Pack years: 10.00    Types: Cigarettes  . Smokeless tobacco: Never Used  Substance Use Topics  . Alcohol use: Not Currently    Alcohol/week: 1.0 standard drinks    Types: 1 Glasses of wine per week  . Drug use: Yes    Types: Marijuana    Family History Family History  Problem Relation Age of Onset  . Lupus Mother   . Diabetes Father   . Heart attack Father     Surgical History Past Surgical History:  Procedure Laterality Date  . ABDOMINAL HYSTERECTOMY     "partial" (11/03/2012)  . AV FISTULA PLACEMENT Right 12/03/2013   Procedure: CREATION OF ARTERIOVENOUS (AV) FISTULA RIGHT ARM ;  Surgeon: Rosetta Posner, MD;  Location: [facility];  Service: Vascular;  Laterality: Right;  . AV FISTULA PLACEMENT Left 05/17/2018   Procedure: CREATION OF BRACHIOCEPHALIC FISTULA LEFT ARM;  Surgeon: Waynetta Sandy, MD;  Location: [facility];  Service: Vascular;  Laterality: Left;  . [place] REMOVAL Right 03/16/2018   Procedure: REMOVAL OF ARTERIOVENOUS GORETEX GRAFT ([place]) RIGHT ARM;  Surgeon: Waynetta Sandy, MD;  Location: [facility];  Service: Vascular;  Laterality: Right;  . BASCILIC VEIN TRANSPOSITION Right 03/06/2014   Procedure: SECOND STAGE BASILIC VEIN TRANSPOSITION;  Surgeon: Rosetta Posner, MD;  Location: [facility] OR;  Service: Vascular;  Laterality: Right;  . ESOPHAGOGASTRODUODENOSCOPY N/A 06/05/2013   Procedure: ESOPHAGOGASTRODUODENOSCOPY (EGD);  Surgeon: Winfield Cunas., MD;  Location: [facility];  Service: Endoscopy;  Laterality: N/A;  . ESOPHAGOGASTRODUODENOSCOPY N/A 09/08/2016   Procedure: ESOPHAGOGASTRODUODENOSCOPY (EGD);  Surgeon: Milus Banister, MD;  Location: [facility];  Service: Endoscopy;   Laterality: N/A;  . ESOPHAGOGASTRODUODENOSCOPY (EGD) WITH PROPOFOL N/A 09/17/2018   Procedure: ESOPHAGOGASTRODUODENOSCOPY (EGD) WITH PROPOFOL;  Surgeon: Carol Ada, MD;  Location: [facility];  Service: Endoscopy;  Laterality: N/A;  . INSERTION OF DIALYSIS CATHETER Left 03/16/2018   Procedure: PLACEMENT OF TUNNEL DIALYSIS CATHETER;  Surgeon: Waynetta Sandy, MD;  Location: [facility];  Service: Vascular;  Laterality: Left;  . IR REMOVAL TUN CV CATH W/O FL  09/17/2018  . REVISON OF ARTERIOVENOUS FISTULA Right 01/28/2017   Procedure: REVISON OF RIGHT ARM ARTERIOVENOUS FISTULA USING 8MMX10CM GORETEX GRAFT;  Surgeon: Angelia Mould, MD;  Location: [facility];  Service: Vascular;  Laterality: Right;    Allergies  Allergen Reactions  . Cefazolin Other (See Comments)    Severe thrombocytopenia (has tolerated zosyn in the past) ID aware Tolerated in October 2019    Current Outpatient Medications  Medication Sig Dispense Refill  . acetaminophen (TYLENOL) 500 MG tablet Take 500-1,000 mg by mouth every 6 (six) hours as needed for headache (pain).    Marland Kitchen albuterol (PROVENTIL HFA;VENTOLIN HFA) 108 (90 Base) MCG/ACT inhaler TAKE 2 PUFFS BY MOUTH EVERY 6 HOURS AS NEEDED FOR WHEEZE OR SHORTNESS OF BREATH (Patient taking differently: Inhale 2 puffs into the lungs every 6 (six) hours as needed for wheezing. ) 18 Inhaler 0  . albuterol (PROVENTIL) (2.5 MG/3ML) 0.083% nebulizer solution Take 3 mLs (2.5 mg total) by nebulization every 6 (six) hours as needed for wheezing or shortness of breath. MUST MAKE APPT FOR FURTHER REFILLS 75 mL 0  . Amino Acids-Protein Hydrolys (FEEDING SUPPLEMENT, PRO-STAT SUGAR FREE 64,) LIQD Take 30 mLs by mouth 2 (two) times daily. 887 mL 0  . amLODipine (NORVASC) 10 MG tablet Take 1 tablet (10 mg total) by mouth daily. (Patient taking differently: Take 10 mg by mouth at bedtime. ) 30 tablet 0  . b complex-vitamin c-folic acid (NEPHRO-VITE) 0.8 MG TABS tablet Take 1 tablet by  mouth every evening.    . calcium acetate (PHOSLO) 667 MG capsule Take 2 capsules (1,334 mg total) by mouth 3 (three) times daily with meals. 90 capsule 0  . ceFEPIme 2 g in sodium chloride 0.9 % 100 mL Inject 2 g into the vein Every Tuesday,Thursday,and Saturday with dialysis for 11 days.    . cinacalcet (SENSIPAR) 30 MG tablet TAKE 1 TABLET BY MOUTH DAILY EVERY EVENING (DO NOT TAKE LESS THAN 12 HOURS PRIOR TO DIALYSIS) 60 tablet 0  . ferric citrate (AURYXIA) 1 GM 210 MG(Fe) tablet Take 420 mg by mouth 3 (three) times daily with meals.    . metoprolol tartrate (LOPRESSOR) 25 MG tablet Take 25 mg by mouth 2 (two) times daily.    . pantoprazole (PROTONIX) 40 MG tablet Take 1 tablet (40 mg total) by mouth 2 (two) times daily.  60 tablet 0   No current facility-administered medications for this visit.      REVIEW OF SYSTEMS: see HPI for pertinent positives and negatives    PHYSICAL EXAMINATION:  Vitals:   09/25/18 1034  BP: (!) 162/93  Pulse: 92  Resp: 16  Temp: (!) 97.2 F (36.2 C)  TempSrc: Oral  SpO2: 100%  Weight: 124 lb (56.2 kg)  Height: 5\' 7"  (1.702 m)   Body mass index is 19.42 kg/m.  General: Thin female appears older than her stated age.   HEENT:  No gross abnormalities Pulmonary: Respirations are non-labored Abdomen: Soft and non-tender with normal bowel sounds. Musculoskeletal: There are no major deformities. See Cardiovascular.    Neurologic: Bilateral hand grip: Right is 5/5, left is 3/5. Right leg raise M/S is 4/5, left is 2/5. Skin: There are no ulcer or rashes noted. Multiples skin blebs on face. Psychiatric: The patient has normal affect. Cardiovascular: There is a regular rate and rhythm with mid grade murmur appreciated.  Right radial pulse is 2+ palpable, left is 1+ palpable.  Left upper arm is painful to touch, the left upper arm skin has signs of mild cellulitis.  Entire left arm has non pitting edema, see photo below for comparison of arms.        Non-Invasive Vascular Imaging  Left arm Access Duplex  (Date: 08-11-18):  Findings: +--------------------+----------+-----------------+--------+ AVF                 PSV (cm/s)Flow Vol (mL/min)Comments +--------------------+----------+-----------------+--------+ Native artery inflow   126          1402                +--------------------+----------+-----------------+--------+ AVF Anastomosis        235                              +--------------------+----------+-----------------+--------+   +------------+----------+-------------+-----------+----------------------------+ OUTFLOW VEINPSV (cm/s)Diameter (cm)Depth (cm)           Describe           +------------+----------+-------------+-----------+----------------------------+ Shoulder       207        0.55        0.50                                 +------------+----------+-------------+-----------+----------------------------+ Prox UA        176        0.50        0.18                                 +------------+----------+-------------+-----------+----------------------------+ Mid UA         205        0.52        0.29                                 +------------+----------+-------------+-----------+----------------------------+ Dist UA     292 / 633  0.92 / 0.63 0.12 / 0.15  partially-occlusive and  narrowing / fluid       +------------+----------+-------------+-----------+----------------------------+   Summary: Patent left Brachial-cephalic fistula with partially occlusive plaque in the distal upper arm as well as narrowing measuring approximately 1.40 cms in length. Fluid visualized anteriorally measuring 2.10 x 0.66 cms. Increased velocity at area of plaque  noted as well.    Medical Decision Making  JAEDIN TRUMBO is a 58 y.o. female who is s/p left brachiocephalic arteriovenous fistula creation by Dr. Donzetta Matters (Date:  05/17/18).   Her left arm access was duplexed about 6 weeks ago, on 08-11-18, results above.  Pt indicates that the left arm AVF was accessed for the first time a couple of weeks ago, and the [facility] was removed during a hospitalization 10 days ago for altered mental status, sepsis, and GI bleed.  Pt HPI and swelling of her left arm indicates that infiltration during access of her AVF occurred last week.  She denies fever or chills, she is afebrile today.    After discussing with Dr. Trula Slade, pt will be scheduled for [facility] placement tomorrow since her left arm is painful to touch, left upper arm skin characteristics indicate mild cellulitis, and entire left arm has moderate non pitting edema.  She has been receiving an antibiotic via her left arm AVF in HD.   At some point she will need a fistulagram.   Clemon Chambers, RN, MSN, FNP-C [facility] Office: [phone]  09/25/2018, 10:56 AM  Clinic MD: Trula Slade

## 2018-09-25 NOTE — Progress Notes (Signed)
CC: Left arm swelling and pain  For a few days, possibly from AVF access and infiltration  History of Present Illness  Karen Morrow is a 58 y.o. (November 27, 1960) female who is s/p left brachiocephalic arteriovenous fistula creation by Dr. Donzetta Matters (Date: 05/17/18).    Pt was last evaluated on 06-30-18 by M. Eveland PA-C. At that time L brachiocephalic fistula was patent; no signs or symptoms of steal. Fistula should continue to mature over the next 4-6 weeks At that time will recheck fistula duplex prior to allow fistula to be used on HD Pt was to continue HD via [facility] until that time.   Dialysis duplex was performed on 08-11-18.  Her TDC was removed last week while she was hospitalized (on 09-15-18) for altered mental status, sepsis, and GI bleed.   Pt dialyzes T-TH-S via left arm AVF at [facility], [place].   Pt states her left arm AVF was accessed for the first time about 2 weeks ago, pt states accessing infiltrated about the last time or two that it was accessed, and her left arm is swollen and she states "very painful" in her left arm.  She sates she has been applying cold packs to her left arm to help decrease the swelling, as advised by the personnel at HD, states no improvement. Pt is requesting medication for pain; I advised her to consult her nephrologist re this.   She states HD access last week on Tuesday was fine, states the infiltration occurred last Thursday. She does not know if she had HD on Saturday, states she probably did.   She is left hand dominant. She states she had an access in her right arm that became infected and was excised.    She states she has trouble walking, is vague as to why she thinks this is so.   She states that she smokes about 3 cigarettes/ day. She denies ETOH use.    Past Medical History:  Diagnosis Date  . Anginal pain ([place])   . Asthma   . CHF (congestive heart failure) ([place])   . Chronic kidney disease    dialysis 3x wk  . Dyspnea    . Frequent bowel movements   . GERD (gastroesophageal reflux disease)   . Hepatitis C antibody test positive   . Hypertension    "just dx'd today" (11/03/2012)    Social History Social History   Tobacco Use  . Smoking status: Light Tobacco Smoker    Packs/day: 0.50    Years: 20.00    Pack years: 10.00    Types: Cigarettes  . Smokeless tobacco: Never Used  Substance Use Topics  . Alcohol use: Not Currently    Alcohol/week: 1.0 standard drinks    Types: 1 Glasses of wine per week  . Drug use: Yes    Types: Marijuana    Family History Family History  Problem Relation Age of Onset  . Lupus Mother   . Diabetes Father   . Heart attack Father     Surgical History Past Surgical History:  Procedure Laterality Date  . ABDOMINAL HYSTERECTOMY     "partial" (11/03/2012)  . AV FISTULA PLACEMENT Right 12/03/2013   Procedure: CREATION OF ARTERIOVENOUS (AV) FISTULA RIGHT ARM ;  Surgeon: Rosetta Posner, MD;  Location: [place];  Service: Vascular;  Laterality: Right;  . AV FISTULA PLACEMENT Left 05/17/2018   Procedure: CREATION OF BRACHIOCEPHALIC FISTULA LEFT ARM;  Surgeon: Waynetta Sandy, MD;  Location: [place];  Service: Vascular;  Laterality: Left;  . Stephen REMOVAL Right 03/16/2018   Procedure: REMOVAL OF ARTERIOVENOUS GORETEX GRAFT ([place]) RIGHT ARM;  Surgeon: Waynetta Sandy, MD;  Location: [place];  Service: Vascular;  Laterality: Right;  . BASCILIC VEIN TRANSPOSITION Right 03/06/2014   Procedure: SECOND STAGE BASILIC VEIN TRANSPOSITION;  Surgeon: Rosetta Posner, MD;  Location: [facility] OR;  Service: Vascular;  Laterality: Right;  . ESOPHAGOGASTRODUODENOSCOPY N/A 06/05/2013   Procedure: ESOPHAGOGASTRODUODENOSCOPY (EGD);  Surgeon: Winfield Cunas., MD;  Location: [facility];  Service: Endoscopy;  Laterality: N/A;  . ESOPHAGOGASTRODUODENOSCOPY N/A 09/08/2016   Procedure: ESOPHAGOGASTRODUODENOSCOPY (EGD);  Surgeon: Milus Banister, MD;  Location: [place];  Service: Endoscopy;   Laterality: N/A;  . ESOPHAGOGASTRODUODENOSCOPY (EGD) WITH PROPOFOL N/A 09/17/2018   Procedure: ESOPHAGOGASTRODUODENOSCOPY (EGD) WITH PROPOFOL;  Surgeon: Carol Ada, MD;  Location: [place];  Service: Endoscopy;  Laterality: N/A;  . INSERTION OF DIALYSIS CATHETER Left 03/16/2018   Procedure: PLACEMENT OF TUNNEL DIALYSIS CATHETER;  Surgeon: Waynetta Sandy, MD;  Location: [place];  Service: Vascular;  Laterality: Left;  . IR REMOVAL TUN CV CATH W/O FL  09/17/2018  . REVISON OF ARTERIOVENOUS FISTULA Right 01/28/2017   Procedure: REVISON OF RIGHT ARM ARTERIOVENOUS FISTULA USING 8MMX10CM GORETEX GRAFT;  Surgeon: Angelia Mould, MD;  Location: [place];  Service: Vascular;  Laterality: Right;    Allergies  Allergen Reactions  . Cefazolin Other (See Comments)    Severe thrombocytopenia (has tolerated zosyn in the past) ID aware Tolerated in October 2019    Current Outpatient Medications  Medication Sig Dispense Refill  . acetaminophen (TYLENOL) 500 MG tablet Take 500-1,000 mg by mouth every 6 (six) hours as needed for headache (pain).    Marland Kitchen albuterol (PROVENTIL HFA;VENTOLIN HFA) 108 (90 Base) MCG/ACT inhaler TAKE 2 PUFFS BY MOUTH EVERY 6 HOURS AS NEEDED FOR WHEEZE OR SHORTNESS OF BREATH (Patient taking differently: Inhale 2 puffs into the lungs every 6 (six) hours as needed for wheezing. ) 18 Inhaler 0  . albuterol (PROVENTIL) (2.5 MG/3ML) 0.083% nebulizer solution Take 3 mLs (2.5 mg total) by nebulization every 6 (six) hours as needed for wheezing or shortness of breath. MUST MAKE APPT FOR FURTHER REFILLS 75 mL 0  . Amino Acids-Protein Hydrolys (FEEDING SUPPLEMENT, PRO-STAT SUGAR FREE 64,) LIQD Take 30 mLs by mouth 2 (two) times daily. 887 mL 0  . amLODipine (NORVASC) 10 MG tablet Take 1 tablet (10 mg total) by mouth daily. (Patient taking differently: Take 10 mg by mouth at bedtime. ) 30 tablet 0  . b complex-vitamin c-folic acid (NEPHRO-VITE) 0.8 MG TABS tablet Take 1 tablet by  mouth every evening.    . calcium acetate (PHOSLO) 667 MG capsule Take 2 capsules (1,334 mg total) by mouth 3 (three) times daily with meals. 90 capsule 0  . ceFEPIme 2 g in sodium chloride 0.9 % 100 mL Inject 2 g into the vein Every Tuesday,Thursday,and Saturday with dialysis for 11 days.    . cinacalcet (SENSIPAR) 30 MG tablet TAKE 1 TABLET BY MOUTH DAILY EVERY EVENING (DO NOT TAKE LESS THAN 12 HOURS PRIOR TO DIALYSIS) 60 tablet 0  . ferric citrate (AURYXIA) 1 GM 210 MG(Fe) tablet Take 420 mg by mouth 3 (three) times daily with meals.    . metoprolol tartrate (LOPRESSOR) 25 MG tablet Take 25 mg by mouth 2 (two) times daily.    . pantoprazole (PROTONIX) 40 MG tablet Take 1 tablet (40 mg total) by mouth 2 (two) times daily.  60 tablet 0   No current facility-administered medications for this visit.      REVIEW OF SYSTEMS: see HPI for pertinent positives and negatives    PHYSICAL EXAMINATION:  Vitals:   09/25/18 1034  BP: (!) 162/93  Pulse: 92  Resp: 16  Temp: (!) 97.2 F (36.2 C)  TempSrc: Oral  SpO2: 100%  Weight: 124 lb (56.2 kg)  Height: 5\' 7"  (1.702 m)   Body mass index is 19.42 kg/m.  General: Thin female appears older than her stated age.   HEENT:  No gross abnormalities Pulmonary: Respirations are non-labored Abdomen: Soft and non-tender with normal bowel sounds. Musculoskeletal: There are no major deformities. See Cardiovascular.    Neurologic: Bilateral hand grip: Right is 5/5, left is 3/5. Right leg raise M/S is 4/5, left is 2/5. Skin: There are no ulcer or rashes noted. Multiples skin blebs on face. Psychiatric: The patient has normal affect. Cardiovascular: There is a regular rate and rhythm with mid grade murmur appreciated.  Right radial pulse is 2+ palpable, left is 1+ palpable.  Left upper arm is painful to touch, the left upper arm skin has signs of mild cellulitis.  Entire left arm has non pitting edema, see photo below for comparison of arms.        Non-Invasive Vascular Imaging  Left arm Access Duplex  (Date: 08-11-18):  Findings: +--------------------+----------+-----------------+--------+ AVF                 PSV (cm/s)Flow Vol (mL/min)Comments +--------------------+----------+-----------------+--------+ Native artery inflow   126          1402                +--------------------+----------+-----------------+--------+ AVF Anastomosis        235                              +--------------------+----------+-----------------+--------+   +------------+----------+-------------+-----------+----------------------------+ OUTFLOW VEINPSV (cm/s)Diameter (cm)Depth (cm)           Describe           +------------+----------+-------------+-----------+----------------------------+ Shoulder       207        0.55        0.50                                 +------------+----------+-------------+-----------+----------------------------+ Prox UA        176        0.50        0.18                                 +------------+----------+-------------+-----------+----------------------------+ Mid UA         205        0.52        0.29                                 +------------+----------+-------------+-----------+----------------------------+ Dist UA     292 / 633  0.92 / 0.63 0.12 / 0.15  partially-occlusive and  narrowing / fluid       +------------+----------+-------------+-----------+----------------------------+   Summary: Patent left Brachial-cephalic fistula with partially occlusive plaque in the distal upper arm as well as narrowing measuring approximately 1.40 cms in length. Fluid visualized anteriorally measuring 2.10 x 0.66 cms. Increased velocity at area of plaque  noted as well.    Medical Decision Making  JAWANNA DYKMAN is a 58 y.o. female who is s/p left brachiocephalic arteriovenous fistula creation by Dr. Donzetta Matters (Date:  05/17/18).   Her left arm access was duplexed about 6 weeks ago, on 08-11-18, results above.  Pt indicates that the left arm AVF was accessed for the first time a couple of weeks ago, and the [facility] was removed during a hospitalization 10 days ago for altered mental status, sepsis, and GI bleed.  Pt HPI and swelling of her left arm indicates that infiltration during access of her AVF occurred last week.  She denies fever or chills, she is afebrile today.    After discussing with Dr. Trula Slade, pt will be scheduled for [facility] placement tomorrow since her left arm is painful to touch, left upper arm skin characteristics indicate mild cellulitis, and entire left arm has moderate non pitting edema.  She has been receiving an antibiotic via her left arm AVF in HD.   At some point she will need a fistulagram.   Clemon Chambers, RN, MSN, FNP-C [facility] [place] Office: [phone]  09/25/2018, 10:56 AM  Clinic MD: Trula Slade

## 2018-09-25 NOTE — Progress Notes (Signed)
Call to [facility] and spoke with [place] to arrange HD past surgery. Instructed to have patient report to [facility] after surgery for treatment on 09/26/2018. Added these instructions to pre-op letter and reviewed with patient.

## 2018-09-25 NOTE — Telephone Encounter (Signed)
Patient at appointment with [facility]in [facility] V[facility].  RN will call again this afternoon. Landis Gandy, RN

## 2018-09-26 ENCOUNTER — Encounter (HOSPITAL_COMMUNITY)
Admission: RE | Disposition: A | Discharge status: Home / Self Care | Payer: Self-pay | Source: Home / Self Care | Attending: Vascular Surgery

## 2018-09-26 ENCOUNTER — Other Ambulatory Visit: Payer: Self-pay | Admitting: *Deleted

## 2018-09-26 ENCOUNTER — Ambulatory Visit: Payer: Self-pay | Admitting: *Deleted

## 2018-09-26 ENCOUNTER — Ambulatory Visit (HOSPITAL_COMMUNITY): Payer: Medicare Other

## 2018-09-26 ENCOUNTER — Ambulatory Visit (HOSPITAL_COMMUNITY)
Admission: RE | Admit: 2018-09-26 | Discharge: 2018-09-26 | Disposition: A | Discharge status: Home / Self Care | Payer: Medicare Other | Attending: Vascular Surgery | Admitting: Vascular Surgery

## 2018-09-26 ENCOUNTER — Other Ambulatory Visit: Payer: Self-pay

## 2018-09-26 ENCOUNTER — Ambulatory Visit (HOSPITAL_COMMUNITY): Payer: Medicare Other | Admitting: Certified Registered"

## 2018-09-26 ENCOUNTER — Encounter (HOSPITAL_COMMUNITY): Payer: Self-pay

## 2018-09-26 DIAGNOSIS — Z8489 Family history of other specified conditions: Secondary | ICD-10-CM | POA: Diagnosis not present

## 2018-09-26 DIAGNOSIS — D631 Anemia in chronic kidney disease: Secondary | ICD-10-CM | POA: Diagnosis not present

## 2018-09-26 DIAGNOSIS — T82898A Other specified complication of vascular prosthetic devices, implants and grafts, initial encounter: Secondary | ICD-10-CM | POA: Diagnosis not present

## 2018-09-26 DIAGNOSIS — N186 End stage renal disease: Secondary | ICD-10-CM | POA: Insufficient documentation

## 2018-09-26 DIAGNOSIS — Z90710 Acquired absence of both cervix and uterus: Secondary | ICD-10-CM | POA: Diagnosis not present

## 2018-09-26 DIAGNOSIS — Z881 Allergy status to other antibiotic agents status: Secondary | ICD-10-CM | POA: Insufficient documentation

## 2018-09-26 DIAGNOSIS — F17210 Nicotine dependence, cigarettes, uncomplicated: Secondary | ICD-10-CM | POA: Insufficient documentation

## 2018-09-26 DIAGNOSIS — I132 Hypertensive heart and chronic kidney disease with heart failure and with stage 5 chronic kidney disease, or end stage renal disease: Secondary | ICD-10-CM | POA: Insufficient documentation

## 2018-09-26 DIAGNOSIS — N185 Chronic kidney disease, stage 5: Secondary | ICD-10-CM | POA: Diagnosis not present

## 2018-09-26 DIAGNOSIS — K219 Gastro-esophageal reflux disease without esophagitis: Secondary | ICD-10-CM | POA: Insufficient documentation

## 2018-09-26 DIAGNOSIS — E1122 Type 2 diabetes mellitus with diabetic chronic kidney disease: Secondary | ICD-10-CM | POA: Diagnosis not present

## 2018-09-26 DIAGNOSIS — J45909 Unspecified asthma, uncomplicated: Secondary | ICD-10-CM | POA: Diagnosis not present

## 2018-09-26 DIAGNOSIS — Z833 Family history of diabetes mellitus: Secondary | ICD-10-CM | POA: Insufficient documentation

## 2018-09-26 DIAGNOSIS — I509 Heart failure, unspecified: Secondary | ICD-10-CM | POA: Diagnosis not present

## 2018-09-26 DIAGNOSIS — Z79899 Other long term (current) drug therapy: Secondary | ICD-10-CM | POA: Diagnosis not present

## 2018-09-26 DIAGNOSIS — K7460 Unspecified cirrhosis of liver: Secondary | ICD-10-CM | POA: Insufficient documentation

## 2018-09-26 DIAGNOSIS — Z8249 Family history of ischemic heart disease and other diseases of the circulatory system: Secondary | ICD-10-CM | POA: Diagnosis not present

## 2018-09-26 DIAGNOSIS — Z7951 Long term (current) use of inhaled steroids: Secondary | ICD-10-CM | POA: Insufficient documentation

## 2018-09-26 DIAGNOSIS — I5032 Chronic diastolic (congestive) heart failure: Secondary | ICD-10-CM | POA: Diagnosis not present

## 2018-09-26 DIAGNOSIS — Z992 Dependence on renal dialysis: Secondary | ICD-10-CM | POA: Insufficient documentation

## 2018-09-26 DIAGNOSIS — X58XXXA Exposure to other specified factors, initial encounter: Secondary | ICD-10-CM | POA: Insufficient documentation

## 2018-09-26 DIAGNOSIS — Z419 Encounter for procedure for purposes other than remedying health state, unspecified: Secondary | ICD-10-CM

## 2018-09-26 HISTORY — PX: INSERTION OF DIALYSIS CATHETER: SHX1324

## 2018-09-26 LAB — POCT I-STAT 4, (NA,K, GLUC, HGB,HCT)
Glucose, Bld: 79 mg/dL (ref 70–99)
HCT: 30 % — ABNORMAL LOW (ref 36.0–46.0)
Hemoglobin: 10.2 g/dL — ABNORMAL LOW (ref 12.0–15.0)
Potassium: 4.8 mmol/L (ref 3.5–5.1)
Sodium: 135 mmol/L (ref 135–145)

## 2018-09-26 SURGERY — INSERTION OF DIALYSIS CATHETER
Anesthesia: Monitor Anesthesia Care | Site: Chest | Laterality: Right

## 2018-09-26 MED ORDER — METOPROLOL TARTRATE 12.5 MG HALF TABLET
ORAL_TABLET | ORAL | Status: AC
  Administered 2018-09-26: 25 mg via ORAL
  Filled 2018-09-26: qty 1

## 2018-09-26 MED ORDER — LIDOCAINE 2% (20 MG/ML) 5 ML SYRINGE
INTRAMUSCULAR | Status: DC | PRN
  Administered 2018-09-26: 40 mg via INTRAVENOUS

## 2018-09-26 MED ORDER — HEPARIN SODIUM (PORCINE) 1000 UNIT/ML IJ SOLN
INTRAMUSCULAR | Status: DC | PRN
  Administered 2018-09-26: 3400 [IU]

## 2018-09-26 MED ORDER — SODIUM CHLORIDE 0.9 % IV SOLN
INTRAVENOUS | Status: DC | PRN
  Administered 2018-09-26: 50 ug/min via INTRAVENOUS

## 2018-09-26 MED ORDER — HYDRALAZINE HCL 20 MG/ML IJ SOLN
INTRAMUSCULAR | Status: AC
  Administered 2018-09-26: 10 mg via INTRAVENOUS
  Filled 2018-09-26: qty 1

## 2018-09-26 MED ORDER — LIDOCAINE HCL (PF) 1 % IJ SOLN
INTRAMUSCULAR | Status: DC | PRN
  Administered 2018-09-26: 30 mL via INTRADERMAL

## 2018-09-26 MED ORDER — FENTANYL CITRATE (PF) 250 MCG/5ML IJ SOLN
INTRAMUSCULAR | Status: DC | PRN
  Administered 2018-09-26 (×2): 25 ug via INTRAVENOUS

## 2018-09-26 MED ORDER — HYDRALAZINE HCL 20 MG/ML IJ SOLN
10.0000 mg | Freq: Once | INTRAMUSCULAR | Status: AC
  Administered 2018-09-26: 13:00:00 10 mg via INTRAVENOUS

## 2018-09-26 MED ORDER — ONDANSETRON HCL 4 MG/2ML IJ SOLN
INTRAMUSCULAR | Status: DC | PRN
  Administered 2018-09-26: 4 mg via INTRAVENOUS

## 2018-09-26 MED ORDER — HEPARIN SODIUM (PORCINE) 1000 UNIT/ML IJ SOLN: INTRAMUSCULAR | Status: DC | PRN

## 2018-09-26 MED ORDER — PROMETHAZINE HCL 25 MG/ML IJ SOLN: 6.2500 mg | INTRAMUSCULAR | Status: DC | PRN

## 2018-09-26 MED ORDER — CHLORHEXIDINE GLUCONATE 4 % EX LIQD: 60.0000 mL | Freq: Once | CUTANEOUS | Status: DC

## 2018-09-26 MED ORDER — LIDOCAINE 2% (20 MG/ML) 5 ML SYRINGE
INTRAMUSCULAR | Status: AC
  Filled 2018-09-26: qty 5

## 2018-09-26 MED ORDER — ONDANSETRON HCL 4 MG/2ML IJ SOLN
INTRAMUSCULAR | Status: AC
  Administered 2018-09-26: 13:00:00 4 mg via INTRAVENOUS
  Filled 2018-09-26: qty 2

## 2018-09-26 MED ORDER — PROPOFOL 500 MG/50ML IV EMUL
INTRAVENOUS | Status: DC | PRN
  Administered 2018-09-26: 50 ug/kg/min via INTRAVENOUS

## 2018-09-26 MED ORDER — SODIUM CHLORIDE 0.9 % IV SOLN
INTRAVENOUS | Status: AC
  Filled 2018-09-26: qty 1.2

## 2018-09-26 MED ORDER — SODIUM CHLORIDE 0.9 % IV SOLN
INTRAVENOUS | Status: DC | PRN
  Administered 2018-09-26: 14:00:00

## 2018-09-26 MED ORDER — FENTANYL CITRATE (PF) 100 MCG/2ML IJ SOLN: 25.0000 ug | INTRAMUSCULAR | Status: DC | PRN

## 2018-09-26 MED ORDER — ONDANSETRON HCL 4 MG/2ML IJ SOLN
4.0000 mg | Freq: Once | INTRAMUSCULAR | Status: AC
  Administered 2018-09-26: 4 mg via INTRAVENOUS

## 2018-09-26 MED ORDER — ONDANSETRON HCL 4 MG/2ML IJ SOLN
INTRAMUSCULAR | Status: AC
  Filled 2018-09-26: qty 2

## 2018-09-26 MED ORDER — FENTANYL CITRATE (PF) 250 MCG/5ML IJ SOLN
INTRAMUSCULAR | Status: AC
  Filled 2018-09-26: qty 5

## 2018-09-26 MED ORDER — GLYCOPYRROLATE PF 0.2 MG/ML IJ SOSY
PREFILLED_SYRINGE | INTRAMUSCULAR | Status: AC
  Filled 2018-09-26: qty 1

## 2018-09-26 MED ORDER — VANCOMYCIN HCL IN DEXTROSE 1-5 GM/200ML-% IV SOLN
1000.0000 mg | INTRAVENOUS | Status: AC
  Administered 2018-09-26: 10:00:00 1000 mg via INTRAVENOUS

## 2018-09-26 MED ORDER — OXYCODONE-ACETAMINOPHEN 5-325 MG PO TABS: 1.0000 | ORAL_TABLET | Freq: Four times a day (QID) | ORAL | 0 refills | Status: DC | PRN

## 2018-09-26 MED ORDER — DEXAMETHASONE SODIUM PHOSPHATE 10 MG/ML IJ SOLN
INTRAMUSCULAR | Status: AC
  Filled 2018-09-26: qty 1

## 2018-09-26 MED ORDER — MIDAZOLAM HCL 2 MG/2ML IJ SOLN
INTRAMUSCULAR | Status: DC | PRN
  Administered 2018-09-26 (×2): 1 mg via INTRAVENOUS

## 2018-09-26 MED ORDER — MIDAZOLAM HCL 2 MG/2ML IJ SOLN
INTRAMUSCULAR | Status: AC
  Filled 2018-09-26: qty 2

## 2018-09-26 MED ORDER — METOPROLOL TARTRATE 25 MG PO TABS
25.0000 mg | ORAL_TABLET | Freq: Once | ORAL | Status: AC
  Administered 2018-09-26: 10:00:00 25 mg via ORAL
  Filled 2018-09-26: qty 1

## 2018-09-26 MED ORDER — LIDOCAINE HCL (PF) 1 % IJ SOLN
INTRAMUSCULAR | Status: AC
  Filled 2018-09-26: qty 30

## 2018-09-26 MED ORDER — HEPARIN SODIUM (PORCINE) 1000 UNIT/ML IJ SOLN
INTRAMUSCULAR | Status: AC
  Filled 2018-09-26: qty 1

## 2018-09-26 MED ORDER — SODIUM CHLORIDE 0.9 % IV SOLN
INTRAVENOUS | Status: DC
  Administered 2018-09-26: 10:00:00 via INTRAVENOUS

## 2018-09-26 MED ORDER — VANCOMYCIN HCL IN DEXTROSE 1-5 GM/200ML-% IV SOLN
INTRAVENOUS | Status: AC
  Administered 2018-09-26: 1000 mg via INTRAVENOUS
  Filled 2018-09-26: qty 200

## 2018-09-26 MED ORDER — METOPROLOL TARTRATE 12.5 MG HALF TABLET
ORAL_TABLET | ORAL | Status: AC
  Filled 2018-09-26: qty 1

## 2018-09-26 MED ORDER — PROPOFOL 10 MG/ML IV BOLUS
INTRAVENOUS | Status: AC
  Filled 2018-09-26: qty 20

## 2018-09-26 MED ORDER — 0.9 % SODIUM CHLORIDE (POUR BTL) OPTIME
TOPICAL | Status: DC | PRN
  Administered 2018-09-26: 1000 mL

## 2018-09-26 SURGICAL SUPPLY — 40 items
BAG DECANTER FOR FLEXI CONT (MISCELLANEOUS) ×2 IMPLANT
BIOPATCH RED 1 DISK 7.0 (GAUZE/BANDAGES/DRESSINGS) ×2 IMPLANT
CATH PALINDROME RT-P 15FX19CM (CATHETERS) IMPLANT
CATH PALINDROME RT-P 15FX23CM (CATHETERS) ×2 IMPLANT
CATH PALINDROME RT-P 15FX28CM (CATHETERS) IMPLANT
CATH PALINDROME RT-P 15FX55CM (CATHETERS) IMPLANT
CATH STRAIGHT 5FR 65CM (CATHETERS) IMPLANT
CHLORAPREP W/TINT 26ML (MISCELLANEOUS) ×2 IMPLANT
COVER PROBE W GEL 5X96 (DRAPES) ×2 IMPLANT
COVER SURGICAL LIGHT HANDLE (MISCELLANEOUS) ×2 IMPLANT
COVER WAND RF STERILE (DRAPES) IMPLANT
DECANTER SPIKE VIAL GLASS SM (MISCELLANEOUS) ×2 IMPLANT
DERMABOND ADVANCED (GAUZE/BANDAGES/DRESSINGS) ×1
DERMABOND ADVANCED .7 DNX12 (GAUZE/BANDAGES/DRESSINGS) ×1 IMPLANT
DRAPE C-ARM 42X72 X-RAY (DRAPES) ×2 IMPLANT
DRAPE CHEST BREAST 15X10 FENES (DRAPES) ×2 IMPLANT
DRSG COVADERM 4X6 (GAUZE/BANDAGES/DRESSINGS) ×2 IMPLANT
GAUZE 4X4 16PLY RFD (DISPOSABLE) ×2 IMPLANT
GAUZE SPONGE 4X4 12PLY STRL LF (GAUZE/BANDAGES/DRESSINGS) ×2 IMPLANT
GLOVE BIO SURGEON STRL SZ7.5 (GLOVE) ×2 IMPLANT
GOWN STRL REUS W/ TWL LRG LVL3 (GOWN DISPOSABLE) ×2 IMPLANT
GOWN STRL REUS W/TWL LRG LVL3 (GOWN DISPOSABLE) ×2
KIT BASIN OR (CUSTOM PROCEDURE TRAY) ×2 IMPLANT
KIT TURNOVER KIT B (KITS) ×2 IMPLANT
NEEDLE 18GX1X1/2 (RX/OR ONLY) (NEEDLE) ×2 IMPLANT
NEEDLE HYPO 25GX1X1/2 BEV (NEEDLE) ×2 IMPLANT
NS IRRIG 1000ML POUR BTL (IV SOLUTION) ×2 IMPLANT
PACK SURGICAL SETUP 50X90 (CUSTOM PROCEDURE TRAY) ×2 IMPLANT
PAD ARMBOARD 7.5X6 YLW CONV (MISCELLANEOUS) ×4 IMPLANT
SET MICROPUNCTURE 5F STIFF (MISCELLANEOUS) IMPLANT
SUT ETHILON 3 0 PS 1 (SUTURE) ×2 IMPLANT
SUT VICRYL 4-0 PS2 18IN ABS (SUTURE) ×2 IMPLANT
SYR 10ML LL (SYRINGE) ×2 IMPLANT
SYR 20CC LL (SYRINGE) ×4 IMPLANT
SYR 5ML LL (SYRINGE) ×2 IMPLANT
SYR CONTROL 10ML LL (SYRINGE) ×2 IMPLANT
TOWEL GREEN STERILE (TOWEL DISPOSABLE) ×4 IMPLANT
TOWEL GREEN STERILE FF (TOWEL DISPOSABLE) ×2 IMPLANT
WATER STERILE IRR 1000ML POUR (IV SOLUTION) ×2 IMPLANT
WIRE AMPLATZ SS-J .035X180CM (WIRE) IMPLANT

## 2018-09-26 NOTE — Progress Notes (Signed)
Pt hypertensive on admission 216/16mmHg. Pt. had not taken her BP meds, Metoprolol 25 mg given.  Repeat BP 174/9mm Hg Dr. Kalman Shan aware.

## 2018-09-26 NOTE — Anesthesia Postprocedure Evaluation (Signed)
Anesthesia Post Note  Patient: Karen Morrow  Procedure(s) Performed: INSERTION OF DIALYSIS  TUNNELED CATHETER (Right Chest)     Patient location during evaluation: PACU Anesthesia Type: MAC Level of consciousness: awake and alert Pain management: pain level controlled Vital Signs Assessment: post-procedure vital signs reviewed and stable Respiratory status: spontaneous breathing, nonlabored ventilation, respiratory function stable and patient connected to nasal cannula oxygen Cardiovascular status: stable and blood pressure returned to baseline Postop Assessment: no apparent nausea or vomiting Anesthetic complications: no    Last Vitals:  Vitals:   09/26/18 1518 09/26/18 1535  BP: 133/77 135/85  Pulse: 77 88  Resp: 20 (!) 21  Temp: (!) 36.3 C   SpO2: 100% 100%    Last Pain:  Vitals:   09/26/18 1535  TempSrc:   PainSc: 0-No pain                 Aamiyah Derrick S

## 2018-09-26 NOTE — Op Note (Signed)
Procedure: Ultrasound-guided insertion of Palidrome catheter right internal jugular vein  Preoperative diagnosis: End-stage renal disease  Postoperative diagnosis: Same  Anesthesia: Local with IV sedation  Operative findings: 23 cm Palindrome catheter right internal jugular vein  Operative details: After obtaining informed consent, the patient was taken to the operating room. The patient was placed in supine position on the operating room table. After adequate sedation the patient's entire neck and chest were prepped and draped in usual sterile fashion. The patient was placed in Trendelenburg position. Ultrasound was used to identify the patient's right internal jugular vein. This had normal compressibility and respiratory variation. Local anesthesia was infiltrated over the right jugular vein.  Using ultrasound guidance, the right internal jugular vein was successfully cannulated.  A 0.035 J-tipped guidewire was threaded into the right internal jugular vein and into the superior vena cava followed by the inferior vena cava under fluoroscopic guidance.   Next sequential 12 and 14 dilators were placed over the guidewire into the right atrium.  A 16 French dilator with a peel-away sheath was then placed over the guidewire into the right atrium.   The guidewire and dilator were removed. A 23 cm Palindrome catheter was then placed through the peel away sheath into the right atrium.  The catheter was then tunneled subcutaneously, cut to length, and the hub attached. The catheter was noted to flush and draw easily. The catheter was inspected under fluoroscopy and found with its tip to be in the right atrium without any kinks throughout its course. The catheter was sutured to the skin with nylon sutures. The neck insertion site was closed with Vicryl stitch. The catheter was then loaded with concentrated Heparin solution. A dry sterile dressing was applied.  The patient tolerated procedure well and there were  no complications. Instrument sponge and needle counts correct in the case. The patient was taken to the recovery room in stable condition. Chest x-ray will be obtained in the recovery room.  Ruta Hinds, MD [facility] [place] Office: [phone] Pager: [phone]

## 2018-09-26 NOTE — Anesthesia Preprocedure Evaluation (Signed)
Anesthesia Evaluation  Patient identified by MRN, date of birth, ID band Patient awake    Reviewed: Allergy & Precautions, NPO status , Patient's Chart, lab work & pertinent test results  Airway Mallampati: II  TM Distance: >3 FB Neck ROM: Full    Dental no notable dental hx.    Pulmonary neg pulmonary ROS, Current Smoker,    Pulmonary exam normal breath sounds clear to auscultation       Cardiovascular hypertension,  Rhythm:Regular Rate:Normal + Systolic murmurs    Neuro/Psych negative neurological ROS  negative psych ROS   GI/Hepatic negative GI ROS, (+) Cirrhosis       , Hepatitis -, C  Endo/Other  negative endocrine ROS  Renal/GU DialysisRenal disease  negative genitourinary   Musculoskeletal negative musculoskeletal ROS (+)   Abdominal   Peds negative pediatric ROS (+)  Hematology  (+) anemia ,   Anesthesia Other Findings   Reproductive/Obstetrics negative OB ROS                             Anesthesia Physical Anesthesia Plan  ASA: IV  Anesthesia Plan: MAC   Post-op Pain Management:    Induction: Intravenous  PONV Risk Score and Plan: 2 and Ondansetron  Airway Management Planned: Simple Face Mask  Additional Equipment:   Intra-op Plan:   Post-operative Plan:   Informed Consent: I have reviewed the patients History and Physical, chart, labs and discussed the procedure including the risks, benefits and alternatives for the proposed anesthesia with the patient or authorized representative who has indicated his/her understanding and acceptance.     Dental advisory given  Plan Discussed with: CRNA and Surgeon  Anesthesia Plan Comments:         Anesthesia Quick Evaluation

## 2018-09-26 NOTE — Patient Outreach (Signed)
[facility]) Care Management  09/26/2018  Karen Morrow 01/24/1961 [id number]   Transition of care telephone call  Referral received: 09/18/18 Initial outreach: 09/21/18 Insurance: [place] Plan   Subjective: Second unsuccessful telephone call to patient's preferred (home) number in order to complete transition of care assessment; no answer, left HIPAA compliant message requesting return call.   Objective: Per the electronic medical record, Karen Morrow  was hospitalized at [facility] from 4/17-4/21 for Methicillin-sensitive Staphylococcus aureus sepsis likely due to her dialysis catheter and symptomatic anemia with suspected subacute upper GI bleed. Comorbidities include: CHF, HTN,cirrhosis of liver, GERD, gastric ulcer, ESRD on HD, Hepatitis C, malnutrition, and tobacco abuse.  She was discharged to home on 09/19/18. She is to receive cefepime 2 gm IV at dialysis on Tuesday, Thursday and Saturday for 11 days.    Plan: If no return call from patient, this RNCM will attempt a third and final outreach within 4 business days.  Barrington Ellison RN,CCM,CDE [place] Management Coordinator Office Phone [phone] Office Fax [phone]

## 2018-09-26 NOTE — Interval H&P Note (Signed)
History and Physical Interval Note:  09/26/2018 1:45 PM  Karen Morrow  has presented today for surgery, with the diagnosis of FOR HEMODIALYSIS ACCESS.  The various methods of treatment have been discussed with the patient and family. After consideration of risks, benefits and other options for treatment, the patient has consented to  Procedure(s): INSERTION OF DIALYSIS  TUNNELED CATHETER (N/A) as a surgical intervention.  The patient's history has been reviewed, patient examined, no change in status, stable for surgery.  I have reviewed the patient's chart and labs.  Questions were answered to the patient's satisfaction.     Ruta Hinds

## 2018-09-26 NOTE — Transfer of Care (Signed)
Immediate Anesthesia Transfer of Care Note  Patient: Karen Morrow  Procedure(s) Performed: INSERTION OF DIALYSIS  TUNNELED CATHETER (Right Chest)  Patient Location: PACU  Anesthesia Type:MAC  Level of Consciousness: drowsy and patient cooperative  Airway & Oxygen Therapy: Patient Spontanous Breathing and Patient connected to face mask oxygen  Post-op Assessment: Report given to RN and Post -op Vital signs reviewed and stable  Post vital signs: Reviewed and stable  Last Vitals:  Vitals Value Taken Time  BP 133/77 09/26/2018  3:18 PM  Temp    Pulse 79 09/26/2018  3:21 PM  Resp 20 09/26/2018  3:21 PM  SpO2 100 % 09/26/2018  3:21 PM  Vitals shown include unvalidated device data.  Last Pain:  Vitals:   09/26/18 0945  TempSrc:   PainSc: 0-No pain      Patients Stated Pain Goal: 0 (66/44/03 4742)  Complications: No apparent anesthesia complications

## 2018-09-26 NOTE — Progress Notes (Signed)
Pt made aware of surgery delay. Dr. Kalman Shan from anesthesia made aware of recent bp and pt c/o abdominal discomfort and nausea. New orders received.

## 2018-09-26 NOTE — Telephone Encounter (Signed)
Patient currently having procedure done for dialysis. Will follow next week to set up appointment for Hep C treatment.

## 2018-09-27 ENCOUNTER — Encounter (HOSPITAL_COMMUNITY): Payer: Self-pay | Admitting: Vascular Surgery

## 2018-09-27 DIAGNOSIS — K7460 Unspecified cirrhosis of liver: Secondary | ICD-10-CM | POA: Diagnosis not present

## 2018-09-27 DIAGNOSIS — N2581 Secondary hyperparathyroidism of renal origin: Secondary | ICD-10-CM | POA: Diagnosis not present

## 2018-09-27 DIAGNOSIS — D631 Anemia in chronic kidney disease: Secondary | ICD-10-CM | POA: Diagnosis not present

## 2018-09-27 DIAGNOSIS — Z992 Dependence on renal dialysis: Secondary | ICD-10-CM | POA: Diagnosis not present

## 2018-09-27 DIAGNOSIS — A4901 Methicillin susceptible Staphylococcus aureus infection, unspecified site: Secondary | ICD-10-CM | POA: Diagnosis not present

## 2018-09-27 DIAGNOSIS — N186 End stage renal disease: Secondary | ICD-10-CM | POA: Diagnosis not present

## 2018-09-29 ENCOUNTER — Other Ambulatory Visit: Payer: Self-pay

## 2018-09-29 ENCOUNTER — Ambulatory Visit: Payer: Self-pay | Admitting: *Deleted

## 2018-09-29 ENCOUNTER — Inpatient Hospital Stay (HOSPITAL_COMMUNITY)
Admission: EM | Admit: 2018-09-29 | Discharge: 2018-10-01 | DRG: 377 | Disposition: A | Discharge status: Home / Self Care | Payer: Medicare Other | Attending: Student | Admitting: Student

## 2018-09-29 ENCOUNTER — Emergency Department (HOSPITAL_COMMUNITY): Payer: Medicare Other

## 2018-09-29 ENCOUNTER — Other Ambulatory Visit: Payer: Self-pay | Admitting: *Deleted

## 2018-09-29 ENCOUNTER — Encounter (HOSPITAL_COMMUNITY): Payer: Self-pay

## 2018-09-29 DIAGNOSIS — J45909 Unspecified asthma, uncomplicated: Secondary | ICD-10-CM | POA: Diagnosis present

## 2018-09-29 DIAGNOSIS — R109 Unspecified abdominal pain: Secondary | ICD-10-CM | POA: Diagnosis present

## 2018-09-29 DIAGNOSIS — K921 Melena: Secondary | ICD-10-CM | POA: Diagnosis not present

## 2018-09-29 DIAGNOSIS — E875 Hyperkalemia: Secondary | ICD-10-CM | POA: Diagnosis not present

## 2018-09-29 DIAGNOSIS — D62 Acute posthemorrhagic anemia: Secondary | ICD-10-CM | POA: Diagnosis not present

## 2018-09-29 DIAGNOSIS — K5710 Diverticulosis of small intestine without perforation or abscess without bleeding: Secondary | ICD-10-CM | POA: Diagnosis present

## 2018-09-29 DIAGNOSIS — K922 Gastrointestinal hemorrhage, unspecified: Secondary | ICD-10-CM | POA: Diagnosis present

## 2018-09-29 DIAGNOSIS — D696 Thrombocytopenia, unspecified: Secondary | ICD-10-CM

## 2018-09-29 DIAGNOSIS — Z79899 Other long term (current) drug therapy: Secondary | ICD-10-CM

## 2018-09-29 DIAGNOSIS — B182 Chronic viral hepatitis C: Secondary | ICD-10-CM | POA: Diagnosis not present

## 2018-09-29 DIAGNOSIS — K7460 Unspecified cirrhosis of liver: Secondary | ICD-10-CM | POA: Diagnosis present

## 2018-09-29 DIAGNOSIS — Z992 Dependence on renal dialysis: Secondary | ICD-10-CM | POA: Diagnosis not present

## 2018-09-29 DIAGNOSIS — F17210 Nicotine dependence, cigarettes, uncomplicated: Secondary | ICD-10-CM | POA: Diagnosis present

## 2018-09-29 DIAGNOSIS — K2960 Other gastritis without bleeding: Secondary | ICD-10-CM | POA: Diagnosis present

## 2018-09-29 DIAGNOSIS — K2971 Gastritis, unspecified, with bleeding: Secondary | ICD-10-CM | POA: Diagnosis not present

## 2018-09-29 DIAGNOSIS — I5032 Chronic diastolic (congestive) heart failure: Secondary | ICD-10-CM | POA: Diagnosis not present

## 2018-09-29 DIAGNOSIS — I132 Hypertensive heart and chronic kidney disease with heart failure and with stage 5 chronic kidney disease, or end stage renal disease: Secondary | ICD-10-CM | POA: Diagnosis present

## 2018-09-29 DIAGNOSIS — K219 Gastro-esophageal reflux disease without esophagitis: Secondary | ICD-10-CM | POA: Diagnosis present

## 2018-09-29 DIAGNOSIS — Z8249 Family history of ischemic heart disease and other diseases of the circulatory system: Secondary | ICD-10-CM

## 2018-09-29 DIAGNOSIS — R58 Hemorrhage, not elsewhere classified: Secondary | ICD-10-CM | POA: Diagnosis not present

## 2018-09-29 DIAGNOSIS — R7881 Bacteremia: Secondary | ICD-10-CM | POA: Diagnosis present

## 2018-09-29 DIAGNOSIS — K210 Gastro-esophageal reflux disease with esophagitis: Secondary | ICD-10-CM | POA: Diagnosis present

## 2018-09-29 DIAGNOSIS — R52 Pain, unspecified: Secondary | ICD-10-CM | POA: Diagnosis not present

## 2018-09-29 DIAGNOSIS — Z90710 Acquired absence of both cervix and uterus: Secondary | ICD-10-CM

## 2018-09-29 DIAGNOSIS — K259 Gastric ulcer, unspecified as acute or chronic, without hemorrhage or perforation: Secondary | ICD-10-CM | POA: Diagnosis present

## 2018-09-29 DIAGNOSIS — I120 Hypertensive chronic kidney disease with stage 5 chronic kidney disease or end stage renal disease: Secondary | ICD-10-CM | POA: Diagnosis not present

## 2018-09-29 DIAGNOSIS — K254 Chronic or unspecified gastric ulcer with hemorrhage: Secondary | ICD-10-CM | POA: Diagnosis not present

## 2018-09-29 DIAGNOSIS — K760 Fatty (change of) liver, not elsewhere classified: Secondary | ICD-10-CM | POA: Diagnosis present

## 2018-09-29 DIAGNOSIS — N186 End stage renal disease: Secondary | ICD-10-CM | POA: Diagnosis present

## 2018-09-29 DIAGNOSIS — R001 Bradycardia, unspecified: Secondary | ICD-10-CM | POA: Diagnosis not present

## 2018-09-29 DIAGNOSIS — Z881 Allergy status to other antibiotic agents status: Secondary | ICD-10-CM

## 2018-09-29 DIAGNOSIS — B9561 Methicillin susceptible Staphylococcus aureus infection as the cause of diseases classified elsewhere: Secondary | ICD-10-CM | POA: Diagnosis present

## 2018-09-29 DIAGNOSIS — R0602 Shortness of breath: Secondary | ICD-10-CM | POA: Diagnosis not present

## 2018-09-29 DIAGNOSIS — M5136 Other intervertebral disc degeneration, lumbar region: Secondary | ICD-10-CM | POA: Diagnosis present

## 2018-09-29 LAB — CBC WITH DIFFERENTIAL/PLATELET
Abs Immature Granulocytes: 0.13 10*3/uL — ABNORMAL HIGH (ref 0.00–0.07)
Basophils Absolute: 0 10*3/uL (ref 0.0–0.1)
Basophils Relative: 0 %
Eosinophils Absolute: 0 10*3/uL (ref 0.0–0.5)
Eosinophils Relative: 0 %
HCT: 17.2 % — ABNORMAL LOW (ref 36.0–46.0)
Hemoglobin: 5.8 g/dL — CL (ref 12.0–15.0)
Immature Granulocytes: 1 %
Lymphocytes Relative: 9 %
Lymphs Abs: 1.4 10*3/uL (ref 0.7–4.0)
MCH: 31.2 pg (ref 26.0–34.0)
MCHC: 33.7 g/dL (ref 30.0–36.0)
MCV: 92.5 fL (ref 80.0–100.0)
Monocytes Absolute: 0.5 10*3/uL (ref 0.1–1.0)
Monocytes Relative: 4 %
Neutro Abs: 12.9 10*3/uL — ABNORMAL HIGH (ref 1.7–7.7)
Neutrophils Relative %: 86 %
Platelets: 132 10*3/uL — ABNORMAL LOW (ref 150–400)
RBC: 1.86 MIL/uL — ABNORMAL LOW (ref 3.87–5.11)
RDW: 17.7 % — ABNORMAL HIGH (ref 11.5–15.5)
WBC: 14.9 10*3/uL — ABNORMAL HIGH (ref 4.0–10.5)
nRBC: 0.4 % — ABNORMAL HIGH (ref 0.0–0.2)

## 2018-09-29 LAB — COMPREHENSIVE METABOLIC PANEL
ALT: 20 U/L (ref 0–44)
AST: 32 U/L (ref 15–41)
Albumin: 2.3 g/dL — ABNORMAL LOW (ref 3.5–5.0)
Alkaline Phosphatase: 58 U/L (ref 38–126)
Anion gap: 17 — ABNORMAL HIGH (ref 5–15)
BUN: 61 mg/dL — ABNORMAL HIGH (ref 6–20)
CO2: 21 mmol/L — ABNORMAL LOW (ref 22–32)
Calcium: 8.9 mg/dL (ref 8.9–10.3)
Chloride: 97 mmol/L — ABNORMAL LOW (ref 98–111)
Creatinine, Ser: 7.85 mg/dL — ABNORMAL HIGH (ref 0.44–1.00)
GFR calc Af Amer: 6 mL/min — ABNORMAL LOW (ref >60)
GFR calc non Af Amer: 5 mL/min — ABNORMAL LOW (ref >60)
Glucose, Bld: 77 mg/dL (ref 70–99)
Potassium: 5.2 mmol/L — ABNORMAL HIGH (ref 3.5–5.1)
Sodium: 135 mmol/L (ref 135–145)
Total Bilirubin: 0.7 mg/dL (ref 0.3–1.2)
Total Protein: 5.5 g/dL — ABNORMAL LOW (ref 6.5–8.1)

## 2018-09-29 LAB — PREPARE RBC (CROSSMATCH)

## 2018-09-29 LAB — HEMOGLOBIN AND HEMATOCRIT, BLOOD
HCT: 20.1 % — ABNORMAL LOW (ref 36.0–46.0)
Hemoglobin: 6.8 g/dL — CL (ref 12.0–15.0)

## 2018-09-29 MED ORDER — AMLODIPINE BESYLATE 10 MG PO TABS
10.0000 mg | ORAL_TABLET | Freq: Every day | ORAL | Status: DC
  Administered 2018-09-30: 10 mg via ORAL
  Filled 2018-09-29 (×2): qty 1

## 2018-09-29 MED ORDER — ALBUTEROL SULFATE (2.5 MG/3ML) 0.083% IN NEBU: 2.5000 mg | INHALATION_SOLUTION | Freq: Four times a day (QID) | RESPIRATORY_TRACT | Status: DC | PRN

## 2018-09-29 MED ORDER — METOPROLOL TARTRATE 25 MG PO TABS: 25.0000 mg | ORAL_TABLET | Freq: Two times a day (BID) | ORAL | Status: DC

## 2018-09-29 MED ORDER — PANTOPRAZOLE SODIUM 40 MG IV SOLR
40.0000 mg | Freq: Two times a day (BID) | INTRAVENOUS | Status: DC
  Administered 2018-09-29 – 2018-10-01 (×4): 40 mg via INTRAVENOUS
  Filled 2018-09-29 (×4): qty 40

## 2018-09-29 MED ORDER — SODIUM ZIRCONIUM CYCLOSILICATE 10 G PO PACK
10.0000 g | PACK | Freq: Every day | ORAL | Status: AC
  Administered 2018-09-29: 10 g via ORAL
  Filled 2018-09-29: qty 1

## 2018-09-29 MED ORDER — FERRIC CITRATE 1 GM 210 MG(FE) PO TABS
420.0000 mg | ORAL_TABLET | Freq: Three times a day (TID) | ORAL | Status: DC
  Administered 2018-09-29 – 2018-10-01 (×4): 420 mg via ORAL
  Filled 2018-09-29 (×4): qty 2

## 2018-09-29 MED ORDER — CHLORHEXIDINE GLUCONATE CLOTH 2 % EX PADS
6.0000 | MEDICATED_PAD | Freq: Every day | CUTANEOUS | Status: DC
  Administered 2018-09-30: 6 via TOPICAL

## 2018-09-29 MED ORDER — CINACALCET HCL 30 MG PO TABS
30.0000 mg | ORAL_TABLET | Freq: Every day | ORAL | Status: DC
  Administered 2018-09-29 – 2018-09-30 (×2): 30 mg via ORAL
  Filled 2018-09-29 (×2): qty 1

## 2018-09-29 MED ORDER — ONDANSETRON HCL 4 MG PO TABS: 4.0000 mg | ORAL_TABLET | Freq: Four times a day (QID) | ORAL | Status: DC | PRN

## 2018-09-29 MED ORDER — SODIUM CHLORIDE 0.9 % IV SOLN
2.0000 g | INTRAVENOUS | Status: DC
  Administered 2018-09-30: 2 g via INTRAVENOUS
  Filled 2018-09-29 (×2): qty 2

## 2018-09-29 MED ORDER — CALCIUM ACETATE 667 MG PO CAPS: 1334.0000 mg | ORAL_CAPSULE | Freq: Three times a day (TID) | ORAL | Status: DC

## 2018-09-29 MED ORDER — ONDANSETRON HCL 4 MG/2ML IJ SOLN
4.0000 mg | Freq: Four times a day (QID) | INTRAMUSCULAR | Status: DC | PRN
  Administered 2018-09-29 – 2018-09-30 (×2): 4 mg via INTRAVENOUS
  Filled 2018-09-29 (×2): qty 2

## 2018-09-29 MED ORDER — ACETAMINOPHEN 650 MG RE SUPP: 650.0000 mg | Freq: Four times a day (QID) | RECTAL | Status: DC | PRN

## 2018-09-29 MED ORDER — ACETAMINOPHEN 325 MG PO TABS: 650.0000 mg | ORAL_TABLET | Freq: Four times a day (QID) | ORAL | Status: DC | PRN

## 2018-09-29 MED ORDER — METOPROLOL TARTRATE 5 MG/5ML IV SOLN
2.5000 mg | Freq: Once | INTRAVENOUS | Status: AC
  Administered 2018-09-29: 2.5 mg via INTRAVENOUS
  Filled 2018-09-29: qty 5

## 2018-09-29 MED ORDER — SODIUM CHLORIDE 0.9 % IV SOLN: 10.0000 mL/h | Freq: Once | INTRAVENOUS | Status: DC

## 2018-09-29 MED ORDER — METOPROLOL TARTRATE 25 MG PO TABS
25.0000 mg | ORAL_TABLET | Freq: Two times a day (BID) | ORAL | Status: DC
  Administered 2018-09-29 – 2018-10-01 (×4): 25 mg via ORAL
  Filled 2018-09-29 (×5): qty 1

## 2018-09-29 NOTE — ED Triage Notes (Signed)
Pt woke up with urge to have a bm and had large bloody bm.  Has been told she has ulcers.  Also states some sob, though sats of 98% on RA.  Dialysis [facility] and she was dialysed yesterday.  Discharged Tues one week ago from hospital.

## 2018-09-29 NOTE — ED Notes (Signed)
Dr. Tamala Julian notified of pt's increased tachycardia; will give metoprolol per MD order and continue to monitor

## 2018-09-29 NOTE — ED Notes (Signed)
Patient transported to CT 

## 2018-09-29 NOTE — ED Notes (Signed)
Attempted report 

## 2018-09-29 NOTE — Patient Outreach (Addendum)
[facility]) Care Management  09/29/2018  Karen Morrow 12/27/1960 [id number]  Transition of Care Call  Unable to make scheduled transition of care call as patient has been admitted to [facility] with GI bleed.   Plan: Will plan to call patient within 48 hours of hospital discharge.  Barrington Ellison RN,CCM,CDE [place] Management Coordinator Office Phone [phone] Office Fax [phone]

## 2018-09-29 NOTE — Progress Notes (Signed)
1820: Patient arrived to room 5w04. No signs of distress. Lump area noted to left arm, painful to touch. Patient refuses to state how area got to arm.

## 2018-09-29 NOTE — ED Notes (Addendum)
EDP aware of increased HR and low HGB

## 2018-09-29 NOTE — H&P (View-Only) (Signed)
[facility] Consult: 4:03 PM 09/29/2018  LOS: 0 days    Referring Provider: Irena Cords, PA-C in the ED. Primary Care Physician:  [name], MD Primary Gastroenterologist: Sadie Haber >> Ardis Hughs.  Dr. Carlean Purl scheduled her to follow-up with Dr. Ardis Hughs on 10/18/2018  Reason for Consultation: Hematochezia.  Anemia.   HPI: Karen Morrow is a 58 y.o. female.  Hx ESRD on TTS hemodialysis..   Hepatitis C.  Thrombocytopenia dating back to 2011, platelets as low as 12 in 2015.  Brain atrophy.  Fatty liver on CT 02/2018.    05/2013 EGD for hematemesis.  Dr. Laurence Spates.  Large gastric ulcer probably due to NSAIDs.  This was not actively bleeding.  Biopsies negative for H. pylori and malignancy. 09/17/2018 EGD.  Carol Ada, MD, on call for LEB.  For eval of IDA, chronic blood loss, melena.  LA grade a reflux esophagitis.  Nonbleeding, chronic gastric ulcer with no bleeding stigmata.  Non-bleeding duodenal diverticulum.  During the admission mid April 2020 she had the above EGD performed for GI bleed, anemia.  Hgb 4.8, rose to 8.9 after 3 PRBCs. Discharged on pantoprazole 40 mg p.o. twice daily.  Dr. Arelia Longest requested that she not receive any anticoagulation agents but okay to get heparin during hemodialysis.  Hepatitis C viral load was checked and measured 2,430,000.  INR normal.  She was diagnosed with MSSA sepsis during the admission.  A Port-A-Cath was removed on Tuesday and a new dialysis PermCath was placed in the upper right chest.  Developed large-volume tarry stool this morning.  2 episodes.  No N/V, no abd pain.  Pulse into the 1 teens, 120s.  BPs 100-1 teens/60s to 80s.  Patient is a bit confused though oriented as to place, situation, time.  She does not recall having filled any new prescriptions and does not  recall taking any medications twice a day so it is likely that she is not taking pantoprazole.  Does not take NSAIDs or aspirin.  Does not drink alcohol. Hgb 5.8. Potassium 5.2. CT abdomen pelvis shows small right pleural effusion, cardiomegaly.  Unremarkable liver and biliary ducts.  Severe degenerative disc disease, spinal stenosis in the lumbar sacral spine, atrophic kidneys.  No hematoma.  Past Medical History:  Diagnosis Date   Anginal pain ([place])    Asthma    CHF (congestive heart failure) (HCC)    Chronic kidney disease    dialysis 3x wk   Dyspnea    Frequent bowel movements    GERD (gastroesophageal reflux disease)    Hepatitis C antibody test positive    Hypertension    "just dx'd today" (11/03/2012)    Past Surgical History:  Procedure Laterality Date   ABDOMINAL HYSTERECTOMY     "partial" (11/03/2012)   AV FISTULA PLACEMENT Right 12/03/2013   Procedure: CREATION OF ARTERIOVENOUS (AV) FISTULA RIGHT ARM ;  Surgeon: Rosetta Posner, MD;  Location: [facility];  Service: Vascular;  Laterality: Right;   AV FISTULA PLACEMENT Left 05/17/2018   Procedure: CREATION OF BRACHIOCEPHALIC FISTULA LEFT ARM;  Surgeon:  Waynetta Sandy, MD;  Location: [facility];  Service: Vascular;  Laterality: Left;   [place] REMOVAL Right 03/16/2018   Procedure: REMOVAL OF ARTERIOVENOUS GORETEX GRAFT ([place]) RIGHT ARM;  Surgeon: Waynetta Sandy, MD;  Location: [facility];  Service: Vascular;  Laterality: Right;   [place] Right 03/06/2014   Procedure: SECOND STAGE BASILIC VEIN TRANSPOSITION;  Surgeon: Rosetta Posner, MD;  Location: [facility] OR;  Service: Vascular;  Laterality: Right;   ESOPHAGOGASTRODUODENOSCOPY N/A 06/05/2013   Procedure: ESOPHAGOGASTRODUODENOSCOPY (EGD);  Surgeon: Winfield Cunas., MD;  Location: [facility];  Service: Endoscopy;  Laterality: N/A;   ESOPHAGOGASTRODUODENOSCOPY N/A 09/08/2016   Procedure: ESOPHAGOGASTRODUODENOSCOPY (EGD);  Surgeon: Milus Banister, MD;   Location: [facility];  Service: Endoscopy;  Laterality: N/A;   ESOPHAGOGASTRODUODENOSCOPY (EGD) WITH PROPOFOL N/A 09/17/2018   Procedure: ESOPHAGOGASTRODUODENOSCOPY (EGD) WITH PROPOFOL;  Surgeon: Carol Ada, MD;  Location: [facility];  Service: Endoscopy;  Laterality: N/A;   INSERTION OF DIALYSIS CATHETER Left 03/16/2018   Procedure: PLACEMENT OF TUNNEL DIALYSIS CATHETER;  Surgeon: Waynetta Sandy, MD;  Location: [facility];  Service: Vascular;  Laterality: Left;   INSERTION OF DIALYSIS CATHETER Right 09/26/2018   Procedure: INSERTION OF DIALYSIS  TUNNELED CATHETER;  Surgeon: Elam Dutch, MD;  Location: [facility];  Service: Vascular;  Laterality: Right;   IR REMOVAL TUN CV CATH W/O FL  09/17/2018   REVISON OF ARTERIOVENOUS FISTULA Right 01/28/2017   Procedure: REVISON OF RIGHT ARM ARTERIOVENOUS FISTULA USING 8MMX10CM GORETEX GRAFT;  Surgeon: Angelia Mould, MD;  Location: [facility];  Service: Vascular;  Laterality: Right;    Prior to Admission medications   Medication Sig Start Date End Date Taking? Authorizing Provider  acetaminophen (TYLENOL) 500 MG tablet Take 500-1,000 mg by mouth every 6 (six) hours as needed for headache (pain).   Yes [provider]  albuterol (PROVENTIL HFA;VENTOLIN HFA) 108 (90 Base) MCG/ACT inhaler TAKE 2 PUFFS BY MOUTH EVERY 6 HOURS AS NEEDED FOR WHEEZE OR SHORTNESS OF BREATH Patient taking differently: Inhale 2 puffs into the lungs every 6 (six) hours as needed for wheezing.  05/09/18  Yes [name], MD  albuterol (PROVENTIL) (2.5 MG/3ML) 0.083% nebulizer solution Take 3 mLs (2.5 mg total) by nebulization every 6 (six) hours as needed for wheezing or shortness of breath. MUST MAKE APPT FOR FURTHER REFILLS 08/15/18  Yes [name], MD  Amino Acids-Protein Hydrolys (FEEDING SUPPLEMENT, PRO-STAT SUGAR FREE 64,) LIQD Take 30 mLs by mouth 2 (two) times daily. 09/19/18  Yes Hosie Poisson, MD  amLODipine (NORVASC) 10 MG tablet Take  1 tablet (10 mg total) by mouth daily. Patient taking differently: Take 10 mg by mouth at bedtime.  05/16/18  Yes Thurnell Lose, MD  b complex-vitamin c-folic acid (NEPHRO-VITE) 0.8 MG TABS tablet Take 1 tablet by mouth every evening. 07/27/18  Yes [provider]  ceFEPIme 2 g in sodium chloride 0.9 % 100 mL Inject 2 g into the vein Every Tuesday,Thursday,and Saturday with dialysis for 11 days. 09/21/18 10/02/18 Yes Hosie Poisson, MD  cinacalcet (SENSIPAR) 30 MG tablet TAKE 1 TABLET BY MOUTH DAILY EVERY EVENING (DO NOT TAKE LESS THAN 12 HOURS PRIOR TO DIALYSIS) 09/19/18  Yes Hosie Poisson, MD  ferric citrate (AURYXIA) 1 GM 210 MG(Fe) tablet Take 420 mg by mouth 3 (three) times daily with meals.   Yes [provider]  metoprolol tartrate (LOPRESSOR) 25 MG tablet Take 25 mg by mouth 2 (two) times daily.   Yes  [provider]  oxyCODONE-acetaminophen (PERCOCET/ROXICET) 5-325 MG tablet Take 1 tablet by mouth every 6 (six) hours as needed for severe pain. 09/26/18  Yes Fields, Jessy Oto, MD  pantoprazole (PROTONIX) 40 MG tablet Take 1 tablet (40 mg total) by mouth 2 (two) times daily. 09/19/18  Yes Hosie Poisson, MD  calcium acetate (PHOSLO) 667 MG capsule Take 2 capsules (1,334 mg total) by mouth 3 (three) times daily with meals. Patient not taking: Reported on 09/29/2018 09/19/18   Hosie Poisson, MD    Scheduled Meds:  amLODipine  10 mg Oral QHS   calcium acetate  1,334 mg Oral TID WC   [START ON 09/30/2018] Chlorhexidine Gluconate Cloth  6 each Topical Q0600   cinacalcet  30 mg Oral Q supper   ferric citrate  420 mg Oral TID WC   metoprolol tartrate  25 mg Oral BID   pantoprazole (PROTONIX) IV  40 mg Intravenous Q12H   sodium zirconium cyclosilicate  10 g Oral Daily   Infusions:  sodium chloride     [START ON 09/30/2018] ceFEPIme (MAXIPIME) 2 GM IVPB (Mini-Bag Plus)     PRN Meds: acetaminophen **OR** acetaminophen, albuterol, ondansetron **OR** ondansetron  (ZOFRAN) IV   Allergies as of 09/29/2018 - Review Complete 09/29/2018  Allergen Reaction Noted   Cefazolin Other (See Comments) 12/05/2013    Family History  Problem Relation Age of Onset   Lupus Mother    Diabetes Father    Heart attack Father     Social History   Socioeconomic History   Marital status: Married    Spouse name: Not on file   Number of children: Not on file   Years of education: Not on file   Highest education level: Not on file  Occupational History   Not on file  Social Needs   Financial resource strain: Not on file   Food insecurity:    Worry: Not on file    Inability: Not on file   Transportation needs:    Medical: Not on file    Non-medical: Not on file  Tobacco Use   Smoking status: Light Tobacco Smoker    Packs/day: 0.50    Years: 20.00    Pack years: 10.00    Types: Cigarettes   Smokeless tobacco: Never Used  Substance and Sexual Activity   Alcohol use: Not Currently    Alcohol/week: 1.0 standard drinks    Types: 1 Glasses of wine per week   Drug use: Not Currently    Types: Marijuana   Sexual activity: Not Currently  Lifestyle   Physical activity:    Days per week: Not on file    Minutes per session: Not on file   Stress: Not on file  Relationships   Social connections:    Talks on phone: Not on file    Gets together: Not on file    Attends religious service: Not on file    Active member of club or organization: Not on file    Attends meetings of clubs or organizations: Not on file    Relationship status: Not on file   Intimate partner violence:    Fear of current or ex partner: Not on file    Emotionally abused: Not on file    Physically abused: Not on file    Forced sexual activity: Not on file  Other Topics Concern   Not on file  Social History Narrative   Not on file    REVIEW OF SYSTEMS: Constitutional: Weakness. ENT:  No nose bleeds Pulm: No shortness of breath.  No cough. CV:  No  palpitations, no LE edema.  No chest pain. GU:  No hematuria, no frequency GI: Per HPI. Heme: Not aware of any excessive or unusual bleeding. Transfusions: Per HPI. Neuro:  No headaches, no peripheral tingling or numbness.   Some dizziness.  No syncope. Derm:  No itching, no rash or sores.  Endocrine:  No sweats or chills.  No polyuria or dysuria Immunization: She does not know about any recent vaccinations and there is no records of any immunizations in epic. Travel:  None beyond local counties in last few months.    PHYSICAL EXAM: Vital signs in last 24 hours: Vitals:   09/29/18 1536 09/29/18 1554  BP: 100/81 118/86  Pulse: (!) 123 (!) 122  Resp: (!) 25 15  Temp: 98.3 F (36.8 C) 97.8 F (36.6 C)  SpO2:     Wt Readings from Last 3 Encounters:  09/25/18 56.2 kg  09/19/18 55.3 kg  08/11/18 56.7 kg    General: Ill-appearing, obtunded though not confused.  Have to repeat the questions a few times before she answers. Head: No facial asymmetry or swelling.  No signs of head trauma. Eyes: No scleral icterus.  No conjunctival pallor.  Lesions that look like xanthomas around the eyes. Ears: Not hard of hearing Nose: No discharge Mouth: No blood in the mouth.  Patient would not open her mouth thoroughly but the mucosa looked pink and moist. Neck: No JVD, no masses no thyromegaly. Lungs: No labored breathing.  Lungs clear bilaterally. Heart: RRR.  No MRG.  S1, S2 present. Abdomen: Soft, thin, not tender or distended.  No masses, no HSM, no bruits, no hernias..   Rectal: Did not perform. Musc/Skeltl: No joint redness or swelling. Extremities: No CCE. Neurologic:   Oriented to hospital, and why she is here.  She did not answer the question as to what year it was.  No tremors.  Moves all 4 limbs without obvious weakness or deficit but strength was not tested. Skin: Lymphoma-like lesions around the eyes.  Bruising in the upper right arm. Nodes: No cervical adenopathy. Psych: Flat  affect.  Paucity of speech.  Intake/Output from previous day: No intake/output data recorded. Intake/Output this shift: Total I/O In: 315 [Blood:315] Out: -   LAB RESULTS: Recent Labs    09/29/18 1046  WBC 14.9*  HGB 5.8*  HCT 17.2*  PLT 132*   BMET Lab Results  Component Value Date   NA 135 09/29/2018   NA 135 09/26/2018   NA 134 (L) 09/19/2018   K 5.2 (H) 09/29/2018   K 4.8 09/26/2018   K 3.4 (L) 09/19/2018   CL 97 (L) 09/29/2018   CL 97 (L) 09/19/2018   CL 98 09/16/2018   CO2 21 (L) 09/29/2018   CO2 23 09/19/2018   CO2 21 (L) 09/16/2018   GLUCOSE 77 09/29/2018   GLUCOSE 79 09/26/2018   GLUCOSE 87 09/19/2018   BUN 61 (H) 09/29/2018   BUN 37 (H) 09/19/2018   BUN 70 (H) 09/16/2018   CREATININE 7.85 (H) 09/29/2018   CREATININE 6.52 (H) 09/19/2018   CREATININE 9.84 (H) 09/16/2018   CALCIUM 8.9 09/29/2018   CALCIUM 8.4 (L) 09/19/2018   CALCIUM 9.0 09/16/2018   LFT Recent Labs    09/29/18 1046  PROT 5.5*  ALBUMIN 2.3*  AST 32  ALT 20  ALKPHOS 58  BILITOT 0.7   PT/INR Lab Results  Component Value Date  INR 1.0 09/15/2018   INR 0.97 03/10/2018   INR 1.06 09/07/2016   Hepatitis Panel No results for input(s): HEPBSAG, HCVAB, HEPAIGM, HEPBIGM in the last 72 hours. C-Diff No components found for: CDIFF Lipase     Component Value Date/Time   LIPASE 34 09/15/2018 2153    Drugs of Abuse     Component Value Date/Time   LABOPIA NONE DETECTED 11/26/2013 2021   COCAINSCRNUR NONE DETECTED 11/26/2013 2021   LABBENZ NONE DETECTED 11/26/2013 2021   AMPHETMU NONE DETECTED 11/26/2013 2021   THCU POSITIVE (A) 11/26/2013 2021   LABBARB NONE DETECTED 11/26/2013 2021     RADIOLOGY STUDIES: Ct Abdomen Pelvis Wo Contrast  Result Date: 09/29/2018 CLINICAL DATA:  Acute abdominal pain.  Bloody stool. EXAM: CT ABDOMEN AND PELVIS WITHOUT CONTRAST TECHNIQUE: Multidetector CT imaging of the abdomen and pelvis was performed following the standard protocol without IV  contrast. COMPARISON:  None. FINDINGS: LOWER CHEST: Small right basilar effusion.  Mild cardiomegaly. HEPATOBILIARY: The hepatic contours and density are normal. There is no intra- or extrahepatic biliary dilatation. Gallbladder absent or nondistended. PANCREAS: The pancreatic parenchymal contours are normal and there is no ductal dilatation. There is no peripancreatic fluid collection. SPLEEN: Normal. ADRENALS/URINARY TRACT: --Adrenal glands: Normal. --Right kidney/ureter: Generalized atrophy with multiple calcifications. --Left kidney/ureter: Generalized atrophy with multiple calcifications. --Urinary bladder: Normal for degree of distention STOMACH/BOWEL: --Stomach/Duodenum: There is no hiatal hernia or other gastric abnormality. The duodenal course and caliber are normal. --Small bowel: No dilatation or inflammation. --Colon: No focal abnormality. --Appendix: Normal. VASCULAR/LYMPHATIC: There is aortic atherosclerosis without hemodynamically significant stenosis. No abdominal or pelvic lymphadenopathy. REPRODUCTIVE: Status post hysterectomy. No adnexal mass. MUSCULOSKELETAL. Severe L5-S1 disc space narrowing with left-greater-than-right neural foraminal stenosis. OTHER: None. IMPRESSION: 1. No acute abnormality of the abdomen or pelvis. 2. Small right pleural effusion and mild cardiomegaly. 3. Severe L5-S1 degenerative disc disease with moderate-to-severe bilateral neural foraminal stenosis. 4. Bilateral renal atrophy. Electronically Signed   By: Ulyses Jarred M.D.   On: 09/29/2018 14:33      IMPRESSION:   *    Recurrent GI bleed and blood loss anemia in patient with gastric ulcer.  Patient describes tarry stools not  hematochezia. Patient may not have been taking Protonix which was prescribed twice daily after discharge. 2 PRBCs ordered, the first is transfusing now. No previous colonoscopy.  *    ESRD.  Dialyzes TTS  *     Hepatitis C with significant viral load 2 weeks ago.  No evidence for  cirrhosis on CT scan today.  No evidence for portal hypertension or varices on EGD 2 weeks ago.   Coags normal 2 weeks ago.  *    Thrombocytopenia, noncritical, chronic.    PLAN:     *   Orders placed for EGD for tomorrow.    *   Protonix 40 mg IV BID.     Recheck Hgb this evening, CBC in the morning.  *   Clears ok.  NPO after midnight.     Azucena Freed  09/29/2018, 4:03 PM Phone [phone]   Attending physician's note   I have taken a history, examined the patient and reviewed the chart. I agree with the Advanced Practitioner's note, impression and recommendations.  58 year old female with end-stage renal disease on hemodialysis, chronic hepatitis C and chronic thrombocytopenia admitted with severe anemia and melena  Plan for EGD tomorrow morning  PPI gtt.  Recheck PT/INR  Based on recent labs and imaging do  not suggest cirrhosis or portal hypertension  Will need to be referred for hepatitis C treatment as outpatient once acute issues resolve  N.p.o. after midnight    K. Denzil Magnuson , MD [phone]

## 2018-09-29 NOTE — Consult Note (Addendum)
[facility] Consult: 4:03 PM 09/29/2018  LOS: 0 days    Referring Provider: Irena Cords, PA-C in the ED. Primary Care Physician:  [name], MD Primary Gastroenterologist: Sadie Haber >> Ardis Hughs.  Dr. Carlean Purl scheduled her to follow-up with Dr. Ardis Hughs on 10/18/2018  Reason for Consultation: Hematochezia.  Anemia.   HPI: Karen Morrow is a 58 y.o. female.  Hx ESRD on TTS hemodialysis..   Hepatitis C.  Thrombocytopenia dating back to 2011, platelets as low as 12 in 2015.  Brain atrophy.  Fatty liver on CT 02/2018.    05/2013 EGD for hematemesis.  Dr. Laurence Spates.  Large gastric ulcer probably due to NSAIDs.  This was not actively bleeding.  Biopsies negative for H. pylori and malignancy. 09/17/2018 EGD.  Carol Ada, MD, on call for LEB.  For eval of IDA, chronic blood loss, melena.  LA grade a reflux esophagitis.  Nonbleeding, chronic gastric ulcer with no bleeding stigmata.  Non-bleeding duodenal diverticulum.  During the admission mid April 2020 she had the above EGD performed for GI bleed, anemia.  Hgb 4.8, rose to 8.9 after 3 PRBCs. Discharged on pantoprazole 40 mg p.o. twice daily.  Dr. Arelia Longest requested that she not receive any anticoagulation agents but okay to get heparin during hemodialysis.  Hepatitis C viral load was checked and measured 2,430,000.  INR normal.  She was diagnosed with MSSA sepsis during the admission.  A Port-A-Cath was removed on Tuesday and a new dialysis PermCath was placed in the upper right chest.  Developed large-volume tarry stool this morning.  2 episodes.  No N/V, no abd pain.  Pulse into the 1 teens, 120s.  BPs 100-1 teens/60s to 80s.  Patient is a bit confused though oriented as to place, situation, time.  She does not recall having filled any new prescriptions and does not  recall taking any medications twice a day so it is likely that she is not taking pantoprazole.  Does not take NSAIDs or aspirin.  Does not drink alcohol. Hgb 5.8. Potassium 5.2. CT abdomen pelvis shows small right pleural effusion, cardiomegaly.  Unremarkable liver and biliary ducts.  Severe degenerative disc disease, spinal stenosis in the lumbar sacral spine, atrophic kidneys.  No hematoma.  Past Medical History:  Diagnosis Date   Anginal pain ([place])    Asthma    CHF (congestive heart failure) (H[facility])    Chronic kidney disease    dialysis 3x wk   Dyspnea    Frequent bowel movements    GERD (gastroesophageal reflux disease)    Hepatitis C antibody test positive    Hypertension    "just dx'd today" (11/03/2012)    Past Surgical History:  Procedure Laterality Date   ABDOMINAL HYSTERECTOMY     "partial" (11/03/2012)   AV FISTULA PLACEMENT Right 12/03/2013   Procedure: CREATION OF ARTERIOVENOUS (AV) FISTULA RIGHT ARM ;  Surgeon: Rosetta Posner, MD;  Location: [place];  Service: Vascular;  Laterality: Right;   AV FISTULA PLACEMENT Left 05/17/2018   Procedure: CREATION OF BRACHIOCEPHALIC FISTULA LEFT ARM;  Surgeon:  Waynetta Sandy, MD;  Location: [place];  Service: Vascular;  Laterality: Left;   [place] REMOVAL Right 03/16/2018   Procedure: REMOVAL OF ARTERIOVENOUS GORETEX GRAFT ([place]) RIGHT ARM;  Surgeon: Waynetta Sandy, MD;  Location: [place];  Service: Vascular;  Laterality: Right;   [place] Right 03/06/2014   Procedure: SECOND STAGE BASILIC VEIN TRANSPOSITION;  Surgeon: Rosetta Posner, MD;  Location: [facility];  Service: Vascular;  Laterality: Right;   ESOPHAGOGASTRODUODENOSCOPY N/A 06/05/2013   Procedure: ESOPHAGOGASTRODUODENOSCOPY (EGD);  Surgeon: Winfield Cunas., MD;  Location: [facility];  Service: Endoscopy;  Laterality: N/A;   ESOPHAGOGASTRODUODENOSCOPY N/A 09/08/2016   Procedure: ESOPHAGOGASTRODUODENOSCOPY (EGD);  Surgeon: Milus Banister, MD;   Location: [place];  Service: Endoscopy;  Laterality: N/A;   ESOPHAGOGASTRODUODENOSCOPY (EGD) WITH PROPOFOL N/A 09/17/2018   Procedure: ESOPHAGOGASTRODUODENOSCOPY (EGD) WITH PROPOFOL;  Surgeon: Carol Ada, MD;  Location: [place];  Service: Endoscopy;  Laterality: N/A;   INSERTION OF DIALYSIS CATHETER Left 03/16/2018   Procedure: PLACEMENT OF TUNNEL DIALYSIS CATHETER;  Surgeon: Waynetta Sandy, MD;  Location: [place];  Service: Vascular;  Laterality: Left;   INSERTION OF DIALYSIS CATHETER Right 09/26/2018   Procedure: INSERTION OF DIALYSIS  TUNNELED CATHETER;  Surgeon: Elam Dutch, MD;  Location: [facility];  Service: Vascular;  Laterality: Right;   IR REMOVAL TUN CV CATH W/O FL  09/17/2018   REVISON OF ARTERIOVENOUS FISTULA Right 01/28/2017   Procedure: REVISON OF RIGHT ARM ARTERIOVENOUS FISTULA USING 8MMX10CM GORETEX GRAFT;  Surgeon: Angelia Mould, MD;  Location: [place];  Service: Vascular;  Laterality: Right;    Prior to Admission medications   Medication Sig Start Date End Date Taking? Authorizing Provider  acetaminophen (TYLENOL) 500 MG tablet Take 500-1,000 mg by mouth every 6 (six) hours as needed for headache (pain).   Yes [provider]  albuterol (PROVENTIL HFA;VENTOLIN HFA) 108 (90 Base) MCG/ACT inhaler TAKE 2 PUFFS BY MOUTH EVERY 6 HOURS AS NEEDED FOR WHEEZE OR SHORTNESS OF BREATH Patient taking differently: Inhale 2 puffs into the lungs every 6 (six) hours as needed for wheezing.  05/09/18  Yes [name], MD  albuterol (PROVENTIL) (2.5 MG/3ML) 0.083% nebulizer solution Take 3 mLs (2.5 mg total) by nebulization every 6 (six) hours as needed for wheezing or shortness of breath. MUST MAKE APPT FOR FURTHER REFILLS 08/15/18  Yes [name], MD  Amino Acids-Protein Hydrolys (FEEDING SUPPLEMENT, PRO-STAT SUGAR FREE 64,) LIQD Take 30 mLs by mouth 2 (two) times daily. 09/19/18  Yes Hosie Poisson, MD  amLODipine (NORVASC) 10 MG tablet Take  1 tablet (10 mg total) by mouth daily. Patient taking differently: Take 10 mg by mouth at bedtime.  05/16/18  Yes Thurnell Lose, MD  b complex-vitamin c-folic acid (NEPHRO-VITE) 0.8 MG TABS tablet Take 1 tablet by mouth every evening. 07/27/18  Yes [provider]  ceFEPIme 2 g in sodium chloride 0.9 % 100 mL Inject 2 g into the vein Every Tuesday,Thursday,and Saturday with dialysis for 11 days. 09/21/18 10/02/18 Yes Hosie Poisson, MD  cinacalcet (SENSIPAR) 30 MG tablet TAKE 1 TABLET BY MOUTH DAILY EVERY EVENING (DO NOT TAKE LESS THAN 12 HOURS PRIOR TO DIALYSIS) 09/19/18  Yes Hosie Poisson, MD  ferric citrate (AURYXIA) 1 GM 210 MG(Fe) tablet Take 420 mg by mouth 3 (three) times daily with meals.   Yes [provider]  metoprolol tartrate (LOPRESSOR) 25 MG tablet Take 25 mg by mouth 2 (two) times daily.   Yes  [provider]  oxyCODONE-acetaminophen (PERCOCET/ROXICET) 5-325 MG tablet Take 1 tablet by mouth every 6 (six) hours as needed for severe pain. 09/26/18  Yes Fields, Jessy Oto, MD  pantoprazole (PROTONIX) 40 MG tablet Take 1 tablet (40 mg total) by mouth 2 (two) times daily. 09/19/18  Yes Hosie Poisson, MD  calcium acetate (PHOSLO) 667 MG capsule Take 2 capsules (1,334 mg total) by mouth 3 (three) times daily with meals. Patient not taking: Reported on 09/29/2018 09/19/18   Hosie Poisson, MD    Scheduled Meds:  amLODipine  10 mg Oral QHS   calcium acetate  1,334 mg Oral TID WC   [START ON 09/30/2018] Chlorhexidine Gluconate Cloth  6 each Topical Q0600   cinacalcet  30 mg Oral Q supper   ferric citrate  420 mg Oral TID WC   metoprolol tartrate  25 mg Oral BID   pantoprazole (PROTONIX) IV  40 mg Intravenous Q12H   sodium zirconium cyclosilicate  10 g Oral Daily   Infusions:  sodium chloride     [START ON 09/30/2018] ceFEPIme (MAXIPIME) 2 GM IVPB (Mini-Bag Plus)     PRN Meds: acetaminophen **OR** acetaminophen, albuterol, ondansetron **OR** ondansetron  (ZOFRAN) IV   Allergies as of 09/29/2018 - Review Complete 09/29/2018  Allergen Reaction Noted   Cefazolin Other (See Comments) 12/05/2013    Family History  Problem Relation Age of Onset   Lupus Mother    Diabetes Father    Heart attack Father     Social History   Socioeconomic History   Marital status: Married    Spouse name: Not on file   Number of children: Not on file   Years of education: Not on file   Highest education level: Not on file  Occupational History   Not on file  Social Needs   Financial resource strain: Not on file   Food insecurity:    Worry: Not on file    Inability: Not on file   Transportation needs:    Medical: Not on file    Non-medical: Not on file  Tobacco Use   Smoking status: Light Tobacco Smoker    Packs/day: 0.50    Years: 20.00    Pack years: 10.00    Types: Cigarettes   Smokeless tobacco: Never Used  Substance and Sexual Activity   Alcohol use: Not Currently    Alcohol/week: 1.0 standard drinks    Types: 1 Glasses of wine per week   Drug use: Not Currently    Types: Marijuana   Sexual activity: Not Currently  Lifestyle   Physical activity:    Days per week: Not on file    Minutes per session: Not on file   Stress: Not on file  Relationships   Social connections:    Talks on phone: Not on file    Gets together: Not on file    Attends religious service: Not on file    Active member of club or organization: Not on file    Attends meetings of clubs or organizations: Not on file    Relationship status: Not on file   Intimate partner violence:    Fear of current or ex partner: Not on file    Emotionally abused: Not on file    Physically abused: Not on file    Forced sexual activity: Not on file  Other Topics Concern   Not on file  Social History Narrative   Not on file    REVIEW OF SYSTEMS: Constitutional: Weakness. ENT:  No nose bleeds Pulm: No shortness of breath.  No cough. CV:  No  palpitations, no LE edema.  No chest pain. GU:  No hematuria, no frequency GI: Per HPI. Heme: Not aware of any excessive or unusual bleeding. Transfusions: Per HPI. Neuro:  No headaches, no peripheral tingling or numbness.   Some dizziness.  No syncope. Derm:  No itching, no rash or sores.  Endocrine:  No sweats or chills.  No polyuria or dysuria Immunization: She does not know about any recent vaccinations and there is no records of any immunizations in epic. Travel:  None beyond local counties in last few months.    PHYSICAL EXAM: Vital signs in last 24 hours: Vitals:   09/29/18 1536 09/29/18 1554  BP: 100/81 118/86  Pulse: (!) 123 (!) 122  Resp: (!) 25 15  Temp: 98.3 F (36.8 C) 97.8 F (36.6 C)  SpO2:     Wt Readings from Last 3 Encounters:  09/25/18 56.2 kg  09/19/18 55.3 kg  08/11/18 56.7 kg    General: Ill-appearing, obtunded though not confused.  Have to repeat the questions a few times before she answers. Head: No facial asymmetry or swelling.  No signs of head trauma. Eyes: No scleral icterus.  No conjunctival pallor.  Lesions that look like xanthomas around the eyes. Ears: Not hard of hearing Nose: No discharge Mouth: No blood in the mouth.  Patient would not open her mouth thoroughly but the mucosa looked pink and moist. Neck: No JVD, no masses no thyromegaly. Lungs: No labored breathing.  Lungs clear bilaterally. Heart: RRR.  No MRG.  S1, S2 present. Abdomen: Soft, thin, not tender or distended.  No masses, no HSM, no bruits, no hernias..   Rectal: Did not perform. Musc/Skeltl: No joint redness or swelling. Extremities: No CCE. Neurologic:   Oriented to hospital, and why she is here.  She did not answer the question as to what year it was.  No tremors.  Moves all 4 limbs without obvious weakness or deficit but strength was not tested. Skin: Lymphoma-like lesions around the eyes.  Bruising in the upper right arm. Nodes: No cervical adenopathy. Psych: Flat  affect.  Paucity of speech.  Intake/Output from previous day: No intake/output data recorded. Intake/Output this shift: Total I/O In: 315 [Blood:315] Out: -   LAB RESULTS: Recent Labs    09/29/18 1046  WBC 14.9*  HGB 5.8*  HCT 17.2*  PLT 132*   BMET Lab Results  Component Value Date   NA 135 09/29/2018   NA 135 09/26/2018   NA 134 (L) 09/19/2018   K 5.2 (H) 09/29/2018   K 4.8 09/26/2018   K 3.4 (L) 09/19/2018   CL 97 (L) 09/29/2018   CL 97 (L) 09/19/2018   CL 98 09/16/2018   CO2 21 (L) 09/29/2018   CO2 23 09/19/2018   CO2 21 (L) 09/16/2018   GLUCOSE 77 09/29/2018   GLUCOSE 79 09/26/2018   GLUCOSE 87 09/19/2018   BUN 61 (H) 09/29/2018   BUN 37 (H) 09/19/2018   BUN 70 (H) 09/16/2018   CREATININE 7.85 (H) 09/29/2018   CREATININE 6.52 (H) 09/19/2018   CREATININE 9.84 (H) 09/16/2018   CALCIUM 8.9 09/29/2018   CALCIUM 8.4 (L) 09/19/2018   CALCIUM 9.0 09/16/2018   LFT Recent Labs    09/29/18 1046  PROT 5.5*  ALBUMIN 2.3*  AST 32  ALT 20  ALKPHOS 58  BILITOT 0.7   PT/INR Lab Results  Component Value Date  INR 1.0 09/15/2018   INR 0.97 03/10/2018   INR 1.06 09/07/2016   Hepatitis Panel No results for input(s): HEPBSAG, HCVAB, HEPAIGM, HEPBIGM in the last 72 hours. C-Diff No components found for: CDIFF Lipase     Component Value Date/Time   LIPASE 34 09/15/2018 2153    Drugs of Abuse     Component Value Date/Time   LABOPIA NONE DETECTED 11/26/2013 2021   COCAINSCRNUR NONE DETECTED 11/26/2013 2021   LABBENZ NONE DETECTED 11/26/2013 2021   AMPHETMU NONE DETECTED 11/26/2013 2021   THCU POSITIVE (A) 11/26/2013 2021   LABBARB NONE DETECTED 11/26/2013 2021     RADIOLOGY STUDIES: Ct Abdomen Pelvis Wo Contrast  Result Date: 09/29/2018 CLINICAL DATA:  Acute abdominal pain.  Bloody stool. EXAM: CT ABDOMEN AND PELVIS WITHOUT CONTRAST TECHNIQUE: Multidetector CT imaging of the abdomen and pelvis was performed following the standard protocol without IV  contrast. COMPARISON:  None. FINDINGS: LOWER CHEST: Small right basilar effusion.  Mild cardiomegaly. HEPATOBILIARY: The hepatic contours and density are normal. There is no intra- or extrahepatic biliary dilatation. Gallbladder absent or nondistended. PANCREAS: The pancreatic parenchymal contours are normal and there is no ductal dilatation. There is no peripancreatic fluid collection. SPLEEN: Normal. ADRENALS/URINARY TRACT: --Adrenal glands: Normal. --Right kidney/ureter: Generalized atrophy with multiple calcifications. --Left kidney/ureter: Generalized atrophy with multiple calcifications. --Urinary bladder: Normal for degree of distention STOMACH/BOWEL: --Stomach/Duodenum: There is no hiatal hernia or other gastric abnormality. The duodenal course and caliber are normal. --Small bowel: No dilatation or inflammation. --Colon: No focal abnormality. --Appendix: Normal. VASCULAR/LYMPHATIC: There is aortic atherosclerosis without hemodynamically significant stenosis. No abdominal or pelvic lymphadenopathy. REPRODUCTIVE: Status post hysterectomy. No adnexal mass. MUSCULOSKELETAL. Severe L5-S1 disc space narrowing with left-greater-than-right neural foraminal stenosis. OTHER: None. IMPRESSION: 1. No acute abnormality of the abdomen or pelvis. 2. Small right pleural effusion and mild cardiomegaly. 3. Severe L5-S1 degenerative disc disease with moderate-to-severe bilateral neural foraminal stenosis. 4. Bilateral renal atrophy. Electronically Signed   By: Ulyses Jarred M.D.   On: 09/29/2018 14:33      IMPRESSION:   *    Recurrent GI bleed and blood loss anemia in patient with gastric ulcer.  Patient describes tarry stools not  hematochezia. Patient may not have been taking Protonix which was prescribed twice daily after discharge. 2 PRBCs ordered, the first is transfusing now. No previous colonoscopy.  *    ESRD.  Dialyzes TTS  *     Hepatitis C with significant viral load 2 weeks ago.  No evidence for  cirrhosis on CT scan today.  No evidence for portal hypertension or varices on EGD 2 weeks ago.   Coags normal 2 weeks ago.  *    Thrombocytopenia, noncritical, chronic.    PLAN:     *   Orders placed for EGD for tomorrow.    *   Protonix 40 mg IV BID.     Recheck Hgb this evening, CBC in the morning.  *   Clears ok.  NPO after midnight.     Azucena Freed  09/29/2018, 4:03 PM Phone [phone]   Attending physician's note   I have taken a history, examined the patient and reviewed the chart. I agree with the Advanced Practitioner's note, impression and recommendations.  58 year old female with end-stage renal disease on hemodialysis, chronic hepatitis C and chronic thrombocytopenia admitted with severe anemia and melena  Plan for EGD tomorrow morning  PPI gtt.  Recheck PT/INR  Based on recent labs and imaging do  not suggest cirrhosis or portal hypertension  Will need to be referred for hepatitis C treatment as outpatient once acute issues resolve  N.p.o. after midnight    K. Denzil Magnuson , MD [phone]

## 2018-09-29 NOTE — ED Provider Notes (Signed)
[facility] EMERGENCY DEPARTMENT Provider Note   CSN: [id number] Arrival date & time: 09/29/18  5465    History   Chief Complaint Chief Complaint  Patient presents with   GI Bleeding   Shortness of Breath    HPI Karen Morrow is a 58 y.o. female.     HPI Patient presents to the emergency department with rectal bleeding that occurred early this morning and has continued since that time.  The patient states that she is never had this happen in the past.  Patient states she is having some abdominal discomfort as well.  Patient states she did not take any medications prior to arrival.  The patient denies chest pain, shortness of breath, headache,blurred vision, neck pain, fever, cough, weakness, numbness, dizziness, anorexia, edema,  nausea, vomiting, diarrhea, rash, back pain, dysuria, hematemesis,  near syncope, or syncope. Past Medical History:  Diagnosis Date   Anginal pain ([place])    Asthma    CHF (congestive heart failure) (HCC)    Chronic kidney disease    dialysis 3x wk   Dyspnea    Frequent bowel movements    GERD (gastroesophageal reflux disease)    Hepatitis C antibody test positive    Hypertension    "just dx'd today" (11/03/2012)    Patient Active Problem List   Diagnosis Date Noted   Gastrointestinal bleeding 09/29/2018   Chronic hepatitis C ([place]) 09/23/2018   Symptomatic anemia 09/16/2018   Severe systemic inflammatory response syndrome (SIRS) ([place]) 09/16/2018   Altered mental status 09/16/2018   Tobacco abuse 09/16/2018   Chronic diastolic (congestive) heart failure ([place]) 07/07/2018   Aortic insufficiency 07/07/2018   Hypertensive urgency 05/13/2018   Infection of arteriovenous fistula ([place]) 04/25/2018   MSSA bacteremia 04/25/2018   Bacteremia    Staphylococcal sepsis ([place])    Sepsis ([place]) 03/10/2018   Hyperkalemia    Acute respiratory distress    Pulmonary hypertension due to left ventricular diastolic  dysfunction ([place]) 11/08/2016   Chronic cough 10/15/2016   Acute GI bleeding 09/07/2016   Gastroesophageal reflux disease 04/16/2016   Anemia in chronic kidney disease, on chronic dialysis ([place]) 04/16/2016   Primary insomnia 04/16/2016   Shortness of breath 04/16/2016   Pruritus 12/11/2014   Hordeolum externum (stye) 12/11/2014   Xanthelasma of right upper eyelid 12/11/2014   Dermatitis 10/30/2014   ESRD (end stage renal disease) ([place]) 02/25/2014   Malnutrition of moderate degree ([place]) 11/28/2013   HTN (hypertension) 11/26/2013   Gastric ulcer 06/07/2013   Chest pain 11/03/2012   Thrombocytopenia ([place]) 11/03/2012   Elevated transaminase level 11/03/2012   Cirrhosis of liver without mention of alcohol, suggested by abdominal US 11/03/2012   Hepatitis C antibody test positive     Past Surgical History:  Procedure Laterality Date   ABDOMINAL HYSTERECTOMY     "partial" (11/03/2012)   AV FISTULA PLACEMENT Right 12/03/2013   Procedure: CREATION OF ARTERIOVENOUS (AV) FISTULA RIGHT ARM ;  Surgeon: Rosetta Posner, MD;  Location: [facility];  Service: Vascular;  Laterality: Right;   AV FISTULA PLACEMENT Left 05/17/2018   Procedure: CREATION OF BRACHIOCEPHALIC FISTULA LEFT ARM;  Surgeon: Waynetta Sandy, MD;  Location: [facility];  Service: Vascular;  Laterality: Left;   [place] REMOVAL Right 03/16/2018   Procedure: REMOVAL OF ARTERIOVENOUS GORETEX GRAFT ([place]) RIGHT ARM;  Surgeon: Waynetta Sandy, MD;  Location: [facility];  Service: Vascular;  Laterality: Right;   [place] Right 03/06/2014   Procedure: SECOND STAGE BASILIC VEIN  TRANSPOSITION;  Surgeon: Rosetta Posner, MD;  Location: [facility] OR;  Service: Vascular;  Laterality: Right;   ESOPHAGOGASTRODUODENOSCOPY N/A 06/05/2013   Procedure: ESOPHAGOGASTRODUODENOSCOPY (EGD);  Surgeon: Winfield Cunas., MD;  Location: [facility];  Service: Endoscopy;  Laterality: N/A;   ESOPHAGOGASTRODUODENOSCOPY N/A  09/08/2016   Procedure: ESOPHAGOGASTRODUODENOSCOPY (EGD);  Surgeon: Milus Banister, MD;  Location: [facility];  Service: Endoscopy;  Laterality: N/A;   ESOPHAGOGASTRODUODENOSCOPY (EGD) WITH PROPOFOL N/A 09/17/2018   Procedure: ESOPHAGOGASTRODUODENOSCOPY (EGD) WITH PROPOFOL;  Surgeon: Carol Ada, MD;  Location: [facility];  Service: Endoscopy;  Laterality: N/A;   INSERTION OF DIALYSIS CATHETER Left 03/16/2018   Procedure: PLACEMENT OF TUNNEL DIALYSIS CATHETER;  Surgeon: Waynetta Sandy, MD;  Location: [facility];  Service: Vascular;  Laterality: Left;   INSERTION OF DIALYSIS CATHETER Right 09/26/2018   Procedure: INSERTION OF DIALYSIS  TUNNELED CATHETER;  Surgeon: Elam Dutch, MD;  Location: [facility] OR;  Service: Vascular;  Laterality: Right;   IR REMOVAL TUN CV CATH W/O FL  09/17/2018   REVISON OF ARTERIOVENOUS FISTULA Right 01/28/2017   Procedure: REVISON OF RIGHT ARM ARTERIOVENOUS FISTULA USING 8MMX10CM GORETEX GRAFT;  Surgeon: Angelia Mould, MD;  Location: [facility];  Service: Vascular;  Laterality: Right;     OB History   No obstetric history on file.      Home Medications    Prior to Admission medications   Medication Sig Start Date End Date Taking? Authorizing Provider  acetaminophen (TYLENOL) 500 MG tablet Take 500-1,000 mg by mouth every 6 (six) hours as needed for headache (pain).   Yes [provider]  albuterol (PROVENTIL HFA;VENTOLIN HFA) 108 (90 Base) MCG/ACT inhaler TAKE 2 PUFFS BY MOUTH EVERY 6 HOURS AS NEEDED FOR WHEEZE OR SHORTNESS OF BREATH Patient taking differently: Inhale 2 puffs into the lungs every 6 (six) hours as needed for wheezing.  05/09/18  Yes [name], MD  albuterol (PROVENTIL) (2.5 MG/3ML) 0.083% nebulizer solution Take 3 mLs (2.5 mg total) by nebulization every 6 (six) hours as needed for wheezing or shortness of breath. MUST MAKE APPT FOR FURTHER REFILLS 08/15/18  Yes [name], MD  Amino Acids-Protein Hydrolys  (FEEDING SUPPLEMENT, PRO-STAT SUGAR FREE 64,) LIQD Take 30 mLs by mouth 2 (two) times daily. 09/19/18  Yes Hosie Poisson, MD  amLODipine (NORVASC) 10 MG tablet Take 1 tablet (10 mg total) by mouth daily. Patient taking differently: Take 10 mg by mouth at bedtime.  05/16/18  Yes Thurnell Lose, MD  b complex-vitamin c-folic acid (NEPHRO-VITE) 0.8 MG TABS tablet Take 1 tablet by mouth every evening. 07/27/18  Yes [provider]  ceFEPIme 2 g in sodium chloride 0.9 % 100 mL Inject 2 g into the vein Every Tuesday,Thursday,and Saturday with dialysis for 11 days. 09/21/18 10/02/18 Yes Hosie Poisson, MD  cinacalcet (SENSIPAR) 30 MG tablet TAKE 1 TABLET BY MOUTH DAILY EVERY EVENING (DO NOT TAKE LESS THAN 12 HOURS PRIOR TO DIALYSIS) 09/19/18  Yes Hosie Poisson, MD  ferric citrate (AURYXIA) 1 GM 210 MG(Fe) tablet Take 420 mg by mouth 3 (three) times daily with meals.   Yes [provider]  metoprolol tartrate (LOPRESSOR) 25 MG tablet Take 25 mg by mouth 2 (two) times daily.   Yes [provider]  oxyCODONE-acetaminophen (PERCOCET/ROXICET) 5-325 MG tablet Take 1 tablet by mouth every 6 (six) hours as needed for severe pain. 09/26/18  Yes Fields, Jessy Oto, MD  pantoprazole (PROTONIX) 40 MG tablet Take 1 tablet (40 mg  total) by mouth 2 (two) times daily. 09/19/18  Yes Hosie Poisson, MD  calcium acetate (PHOSLO) 667 MG capsule Take 2 capsules (1,334 mg total) by mouth 3 (three) times daily with meals. Patient not taking: Reported on 09/29/2018 09/19/18   Hosie Poisson, MD    Family History Family History  Problem Relation Age of Onset   Lupus Mother    Diabetes Father    Heart attack Father     Social History Social History   Tobacco Use   Smoking status: Light Tobacco Smoker    Packs/day: 0.50    Years: 20.00    Pack years: 10.00    Types: Cigarettes   Smokeless tobacco: Never Used  Substance Use Topics   Alcohol use: Not Currently    Alcohol/week: 1.0 standard drinks      Types: 1 Glasses of wine per week   Drug use: Not Currently    Types: Marijuana     Allergies   Cefazolin   Review of Systems Review of Systems All other systems negative except as documented in the HPI. All pertinent positives and negatives as reviewed in the HPI.   Physical Exam Updated Vital Signs BP 100/81    Pulse (!) 123    Temp 98.3 F (36.8 C) (Oral)    Resp (!) 25    SpO2 100%   Physical Exam Vitals signs and nursing note reviewed.  Constitutional:      General: She is not in acute distress.    Appearance: She is well-developed.  HENT:     Head: Normocephalic and atraumatic.  Eyes:     Pupils: Pupils are equal, round, and reactive to light.  Neck:     Musculoskeletal: Normal range of motion and neck supple.  Cardiovascular:     Rate and Rhythm: Normal rate and regular rhythm.     Heart sounds: Normal heart sounds. No murmur. No friction rub. No gallop.   Pulmonary:     Effort: Pulmonary effort is normal. No respiratory distress.     Breath sounds: Normal breath sounds. No wheezing.  Abdominal:     General: Bowel sounds are normal. There is no distension.     Palpations: Abdomen is soft.     Tenderness: There is no abdominal tenderness.  Genitourinary:    Rectum: Guaiac result positive.  Skin:    General: Skin is warm and dry.     Capillary Refill: Capillary refill takes less than 2 seconds.     Findings: No erythema or rash.  Neurological:     Mental Status: She is alert and oriented to person, place, and time.     Motor: No abnormal muscle tone.     Coordination: Coordination normal.  Psychiatric:        Behavior: Behavior normal.      ED Treatments / Results  Labs (all labs ordered are listed, but only abnormal results are displayed) Labs Reviewed  COMPREHENSIVE METABOLIC PANEL - Abnormal; Notable for the following components:      Result Value   Potassium 5.2 (*)    Chloride 97 (*)    CO2 21 (*)    BUN 61 (*)    Creatinine, Ser 7.85  (*)    Total Protein 5.5 (*)    Albumin 2.3 (*)    GFR calc non Af Amer 5 (*)    GFR calc Af Amer 6 (*)    Anion gap 17 (*)    All other components within normal limits  CBC WITH DIFFERENTIAL/PLATELET - Abnormal; Notable for the following components:   WBC 14.9 (*)    RBC 1.86 (*)    Hemoglobin 5.8 (*)    HCT 17.2 (*)    RDW 17.7 (*)    Platelets 132 (*)    nRBC 0.4 (*)    Neutro Abs 12.9 (*)    Abs Immature Granulocytes 0.13 (*)    All other components within normal limits  POC OCCULT BLOOD, ED  TYPE AND SCREEN  PREPARE RBC (CROSSMATCH)    EKG None  Radiology Ct Abdomen Pelvis Wo Contrast  Result Date: 09/29/2018 CLINICAL DATA:  Acute abdominal pain.  Bloody stool. EXAM: CT ABDOMEN AND PELVIS WITHOUT CONTRAST TECHNIQUE: Multidetector CT imaging of the abdomen and pelvis was performed following the standard protocol without IV contrast. COMPARISON:  None. FINDINGS: LOWER CHEST: Small right basilar effusion.  Mild cardiomegaly. HEPATOBILIARY: The hepatic contours and density are normal. There is no intra- or extrahepatic biliary dilatation. Gallbladder absent or nondistended. PANCREAS: The pancreatic parenchymal contours are normal and there is no ductal dilatation. There is no peripancreatic fluid collection. SPLEEN: Normal. ADRENALS/URINARY TRACT: --Adrenal glands: Normal. --Right kidney/ureter: Generalized atrophy with multiple calcifications. --Left kidney/ureter: Generalized atrophy with multiple calcifications. --Urinary bladder: Normal for degree of distention STOMACH/BOWEL: --Stomach/Duodenum: There is no hiatal hernia or other gastric abnormality. The duodenal course and caliber are normal. --Small bowel: No dilatation or inflammation. --Colon: No focal abnormality. --Appendix: Normal. VASCULAR/LYMPHATIC: There is aortic atherosclerosis without hemodynamically significant stenosis. No abdominal or pelvic lymphadenopathy. REPRODUCTIVE: Status post hysterectomy. No adnexal mass.  MUSCULOSKELETAL. Severe L5-S1 disc space narrowing with left-greater-than-right neural foraminal stenosis. OTHER: None. IMPRESSION: 1. No acute abnormality of the abdomen or pelvis. 2. Small right pleural effusion and mild cardiomegaly. 3. Severe L5-S1 degenerative disc disease with moderate-to-severe bilateral neural foraminal stenosis. 4. Bilateral renal atrophy. Electronically Signed   By: Ulyses Jarred M.D.   On: 09/29/2018 14:33    Procedures Procedures (including critical care time)  Medications Ordered in ED Medications  0.9 %  sodium chloride infusion (has no administration in time range)  pantoprazole (PROTONIX) injection 40 mg (has no administration in time range)  sodium zirconium cyclosilicate (LOKELMA) packet 10 g (has no administration in time range)  Chlorhexidine Gluconate Cloth 2 % PADS 6 each (has no administration in time range)  calcium acetate (PHOSLO) capsule 1,334 mg (has no administration in time range)  ceFEPIme (MAXIPIME) 2 g in sodium chloride 0.9 % 100 mL IVPB (has no administration in time range)  amLODipine (NORVASC) tablet 10 mg (has no administration in time range)  metoprolol tartrate (LOPRESSOR) tablet 25 mg (has no administration in time range)  cinacalcet (SENSIPAR) tablet 30 mg (has no administration in time range)  ferric citrate (AURYXIA) tablet 420 mg (has no administration in time range)  albuterol (PROVENTIL) (2.5 MG/3ML) 0.083% nebulizer solution 2.5 mg (has no administration in time range)  ondansetron (ZOFRAN) tablet 4 mg (has no administration in time range)    Or  ondansetron (ZOFRAN) injection 4 mg (has no administration in time range)  acetaminophen (TYLENOL) tablet 650 mg (has no administration in time range)    Or  acetaminophen (TYLENOL) suppository 650 mg (has no administration in time range)     Initial Impression / Assessment and Plan / ED Course  I have reviewed the triage vital signs and the nursing notes.  Pertinent labs &  imaging results that were available during my care of the patient were reviewed by me and  considered in my medical decision making (see chart for details).    CRITICAL CARE Performed by: Resa Miner Elantra Caprara Total critical care time: 30 minutes Critical care time was exclusive of separately billable procedures and treating other patients. Critical care was necessary to treat or prevent imminent or life-threatening deterioration. Critical care was time spent personally by me on the following activities: development of treatment plan with patient and/or surrogate as well as nursing, discussions with consultants, evaluation of patient's response to treatment, examination of patient, obtaining history from patient or surrogate, ordering and performing treatments and interventions, ordering and review of laboratory studies, ordering and review of radiographic studies, pulse oximetry and re-evaluation of patient's condition.     Patient will need admission to the hospital for rectal bleeding and low hemoglobin.  Patient to be transfused 2 units of packed red blood cells.  Patient has been tachycardic as well.  Spoke with the [facility] along with GI who will evaluate the patient.  Final Clinical Impressions(s) / ED Diagnoses   Final diagnoses:  Acute GI bleeding    ED Discharge Orders    None       Dalia Heading, PA-C 10/01/18 [place], MD 10/05/18 [phone]

## 2018-09-29 NOTE — Progress Notes (Signed)
One time lopressor given per order for HR in 130's sustained (notified by Tele).  Paged NP [facility] [phone] notify held 2200 Norvasc due to nausea and GI discomfort; not able tolerate oral meds.   Notified by NT patient bloody stool in bed. RN arrived to bedside; dark maroon stool, loose, some mucous like texture. Patient A&Ox4. Pt. BP 117/76. Patient belly tender to palpation, some nausea. Notified RRT RN Mindy at [place] informed of patient history and admission. Confirmed with Mindy lab on way to unit to collect labs. Will continue to monitor. Agricultural consultant notified. Set bedside monitor to q 1 hr BP. Notified NP Bl[place] via page at 0200.   Notified by tele (other RN received call) that patient HR in 130's. Patient stable, no distress, asking for coffee. Informed patient NPO so not able to have coffee and explained purpose of need to remain NPO and procedure (EGD).   Provided report to HD RN at [phone]. Informed RN patient is to receive 2nd unit PRBC in HD 5/2 per nursing care order/instruction.

## 2018-09-29 NOTE — ED Notes (Signed)
ED TO INPATIENT HANDOFF REPORT  ED Nurse Name and Phone #: [phone]  S Name/Age/Gender Karen Morrow 58 y.o. female Room/Bed: 022C/022C  Code Status   Code Status: Full Code  Home/SNF/Other Home Patient oriented to: self, place, time, and situation Is this baseline? Yes   Triage Complete: Triage complete  Chief Complaint GI Bleed; Dialysis Pt  Triage Note Pt woke up with urge to have a bm and had large bloody bm.  Has been told she has ulcers.  Also states some sob, though sats of 98% on RA.  Dialysis [facility] and she was dialysed yesterday.  Discharged Tues one week ago from hospital.   Allergies Allergies  Allergen Reactions  . Cefazolin Other (See Comments)    Severe thrombocytopenia (has tolerated zosyn in the past) ID aware Tolerated in October 2019    Level of Care/Admitting Diagnosis ED Disposition    ED Disposition Condition [place]: [place] [[id number]]  Level of Care: Telemetry Medical [104]  I expect the patient will be discharged within 24 hours: No (not a candidate for 5C-Observation unit)  Covid Evaluation: N/A  Diagnosis: Gastrointestinal bleeding [102725]  Admitting Physician: Norval Morton [[id number]]  Attending Physician: Norval Morton [[id number]]  PT Class (Do Not Modify): Observation [104]  PT Acc Code (Do Not Modify): Observation [10022]       B Medical/Surgery History Past Medical History:  Diagnosis Date  . Anginal pain ([place])   . Asthma   . CHF (congestive heart failure) ([place])   . Chronic kidney disease    dialysis 3x wk  . Dyspnea   . Frequent bowel movements   . GERD (gastroesophageal reflux disease)   . Hepatitis C antibody test positive   . Hypertension    "just dx'd today" (11/03/2012)   Past Surgical History:  Procedure Laterality Date  . ABDOMINAL HYSTERECTOMY     "partial" (11/03/2012)  . AV FISTULA PLACEMENT Right 12/03/2013   Procedure: CREATION OF ARTERIOVENOUS (AV) FISTULA  RIGHT ARM ;  Surgeon: Rosetta Posner, MD;  Location: [place];  Service: Vascular;  Laterality: Right;  . AV FISTULA PLACEMENT Left 05/17/2018   Procedure: CREATION OF BRACHIOCEPHALIC FISTULA LEFT ARM;  Surgeon: Waynetta Sandy, MD;  Location: [facility];  Service: Vascular;  Laterality: Left;  . [place] REMOVAL Right 03/16/2018   Procedure: REMOVAL OF ARTERIOVENOUS GORETEX GRAFT ([place]) RIGHT ARM;  Surgeon: Waynetta Sandy, MD;  Location: [place];  Service: Vascular;  Laterality: Right;  . BASCILIC VEIN TRANSPOSITION Right 03/06/2014   Procedure: SECOND STAGE BASILIC VEIN TRANSPOSITION;  Surgeon: Rosetta Posner, MD;  Location: [facility] OR;  Service: Vascular;  Laterality: Right;  . ESOPHAGOGASTRODUODENOSCOPY N/A 06/05/2013   Procedure: ESOPHAGOGASTRODUODENOSCOPY (EGD);  Surgeon: Winfield Cunas., MD;  Location: [facility];  Service: Endoscopy;  Laterality: N/A;  . ESOPHAGOGASTRODUODENOSCOPY N/A 09/08/2016   Procedure: ESOPHAGOGASTRODUODENOSCOPY (EGD);  Surgeon: Milus Banister, MD;  Location: [facility];  Service: Endoscopy;  Laterality: N/A;  . ESOPHAGOGASTRODUODENOSCOPY (EGD) WITH PROPOFOL N/A 09/17/2018   Procedure: ESOPHAGOGASTRODUODENOSCOPY (EGD) WITH PROPOFOL;  Surgeon: Carol Ada, MD;  Location: [facility];  Service: Endoscopy;  Laterality: N/A;  . INSERTION OF DIALYSIS CATHETER Left 03/16/2018   Procedure: PLACEMENT OF TUNNEL DIALYSIS CATHETER;  Surgeon: Waynetta Sandy, MD;  Location: [facility];  Service: Vascular;  Laterality: Left;  . INSERTION OF DIALYSIS CATHETER Right 09/26/2018   Procedure: INSERTION OF DIALYSIS  TUNNELED CATHETER;  Surgeon: Ruta Hinds  E, MD;  Location: [facility] OR;  Service: Vascular;  Laterality: Right;  . IR REMOVAL TUN CV CATH W/O FL  09/17/2018  . REVISON OF ARTERIOVENOUS FISTULA Right 01/28/2017   Procedure: REVISON OF RIGHT ARM ARTERIOVENOUS FISTULA USING 8MMX10CM GORETEX GRAFT;  Surgeon: Angelia Mould, MD;  Location: [facility] OR;  Service: Vascular;   Laterality: Right;     A IV Location/Drains/Wounds Patient Lines/Drains/Airways Status   Active Line/Drains/Airways    Name:   Placement date:   Placement time:   Site:   Days:   Peripheral IV 09/29/18 Right;Anterior Forearm   09/29/18    1032    Forearm   less than 1   Fistula / Graft   -    -    -      Fistula / Graft Left Upper arm Arteriovenous fistula   05/17/18    0902    Upper arm   135   Hemodialysis Catheter Left Internal jugular Double-lumen   03/16/18    1524    Internal jugular   197   Hemodialysis Catheter Left Subclavian Triple-lumen   09/26/18    1450    Subclavian   3   Airway   09/26/18    1432     3   Incision (Closed) 03/16/18 Chest Left   03/16/18    1553     197   Incision (Closed) 03/16/18 Arm Right   03/16/18    1600     197   Incision (Closed) 05/17/18 Arm Left   05/17/18    0901     135   Incision (Closed) 09/26/18 Chest Right   09/26/18    1458     3          Intake/Output Last 24 hours  Intake/Output Summary (Last 24 hours) at 09/29/2018 1542 Last data filed at 09/29/2018 1536 Gross per 24 hour  Intake 315 ml  Output -  Net 315 ml    Labs/Imaging Results for orders placed or performed during the hospital encounter of 09/29/18 (from the past 48 hour(s))  Comprehensive metabolic panel     Status: Abnormal   Collection Time: 09/29/18 10:46 AM  Result Value Ref Range   Sodium 135 135 - 145 mmol/L   Potassium 5.2 (H) 3.5 - 5.1 mmol/L   Chloride 97 (L) 98 - 111 mmol/L   CO2 21 (L) 22 - 32 mmol/L   Glucose, Bld 77 70 - 99 mg/dL   BUN 61 (H) 6 - 20 mg/dL   Creatinine, Ser 7.85 (H) 0.44 - 1.00 mg/dL   Calcium 8.9 8.9 - 10.3 mg/dL   Total Protein 5.5 (L) 6.5 - 8.1 g/dL   Albumin 2.3 (L) 3.5 - 5.0 g/dL   AST 32 15 - 41 U/L   ALT 20 0 - 44 U/L   Alkaline Phosphatase 58 38 - 126 U/L   Total Bilirubin 0.7 0.3 - 1.2 mg/dL   GFR calc non Af Amer 5 (L) >60 mL/min   GFR calc Af Amer 6 (L) >60 mL/min   Anion gap 17 (H) 5 - 15    Comment: Performed at [facility], [place] [address] [place], [place] [address]  CBC with Differential     Status: Abnormal   Collection Time: 09/29/18 10:46 AM  Result Value Ref Range   WBC 14.9 (H) 4.0 - 10.5 K/uL   RBC 1.86 (L) 3.87 - 5.11 MIL/uL   Hemoglobin 5.8 (LL) 12.0 -  15.0 g/dL    Comment: REPEATED TO VERIFY THIS CRITICAL RESULT HAS VERIFIED AND BEEN CALLED TO C Daril Warga,RN BY DENNIS BRADLEY ON 05 01 2020 AT 2505, AND HAS BEEN READ BACK. READ BACK AND VERIFIED    HCT 17.2 (L) 36.0 - 46.0 %   MCV 92.5 80.0 - 100.0 fL   MCH 31.2 26.0 - 34.0 pg   MCHC 33.7 30.0 - 36.0 g/dL   RDW 17.7 (H) 11.5 - 15.5 %   Platelets 132 (L) 150 - 400 K/uL   nRBC 0.4 (H) 0.0 - 0.2 %   Neutrophils Relative % 86 %   Neutro Abs 12.9 (H) 1.7 - 7.7 K/uL   Lymphocytes Relative 9 %   Lymphs Abs 1.4 0.7 - 4.0 K/uL   Monocytes Relative 4 %   Monocytes Absolute 0.5 0.1 - 1.0 K/uL   Eosinophils Relative 0 %   Eosinophils Absolute 0.0 0.0 - 0.5 K/uL   Basophils Relative 0 %   Basophils Absolute 0.0 0.0 - 0.1 K/uL   Immature Granulocytes 1 %   Abs Immature Granulocytes 0.13 (H) 0.00 - 0.07 K/uL   Burr Cells PRESENT    Polychromasia PRESENT     Comment: Performed at [facility], [place] [address], [place], [place] [address]  Type and screen [place]     Status: None (Preliminary result)   Collection Time: 09/29/18  1:30 PM  Result Value Ref Range   ABO/RH(D) O POS    Antibody Screen NEG    Sample Expiration 10/02/2018    Unit Number [id number]    Blood Component Type RED CELLS,LR    Unit division 00    Status of Unit ALLOCATED    Transfusion Status OK TO TRANSFUSE    Crossmatch Result Compatible    Unit Number [id number]    Blood Component Type RED CELLS,LR    Unit division 00    Status of Unit ISSUED    Transfusion Status OK TO TRANSFUSE    Crossmatch Result      Compatible Performed at [facility], [place] [address] [place], [place] [address]   Prepare RBC     Status: None    Collection Time: 09/29/18  3:01 PM  Result Value Ref Range   Order Confirmation      ORDER PROCESSED BY BLOOD BANK Performed at [facility], [place] [address] [place], [place] [address]    Ct Abdomen Pelvis Wo Contrast  Result Date: 09/29/2018 CLINICAL DATA:  Acute abdominal pain.  Bloody stool. EXAM: CT ABDOMEN AND PELVIS WITHOUT CONTRAST TECHNIQUE: Multidetector CT imaging of the abdomen and pelvis was performed following the standard protocol without IV contrast. COMPARISON:  None. FINDINGS: LOWER CHEST: Small right basilar effusion.  Mild cardiomegaly. HEPATOBILIARY: The hepatic contours and density are normal. There is no intra- or extrahepatic biliary dilatation. Gallbladder absent or nondistended. PANCREAS: The pancreatic parenchymal contours are normal and there is no ductal dilatation. There is no peripancreatic fluid collection. SPLEEN: Normal. ADRENALS/URINARY TRACT: --Adrenal glands: Normal. --Right kidney/ureter: Generalized atrophy with multiple calcifications. --Left kidney/ureter: Generalized atrophy with multiple calcifications. --Urinary bladder: Normal for degree of distention STOMACH/BOWEL: --Stomach/Duodenum: There is no hiatal hernia or other gastric abnormality. The duodenal course and caliber are normal. --Small bowel: No dilatation or inflammation. --Colon: No focal abnormality. --Appendix: Normal. VASCULAR/LYMPHATIC: There is aortic atherosclerosis without hemodynamically significant stenosis. No abdominal or pelvic lymphadenopathy. REPRODUCTIVE: Status post hysterectomy. No adnexal mass. MUSCULOSKELETAL. Severe L5-S1 disc space  narrowing with left-greater-than-right neural foraminal stenosis. OTHER: None. IMPRESSION: 1. No acute abnormality of the abdomen or pelvis. 2. Small right pleural effusion and mild cardiomegaly. 3. Severe L5-S1 degenerative disc disease with moderate-to-severe bilateral neural foraminal stenosis. 4. Bilateral renal atrophy. Electronically Signed    By: Ulyses Jarred M.D.   On: 09/29/2018 14:33    Pending Labs Unresulted Labs (From admission, onward)    Start     Ordered   09/30/18 0500  CBC  Tomorrow morning,   R     09/29/18 1536   09/30/18 9767  Basic metabolic panel  Tomorrow morning,   R     09/29/18 1536          Vitals/Pain Today's Vitals   09/29/18 1430 09/29/18 1445 09/29/18 1500 09/29/18 1536  BP: 112/85 115/83  100/81  Pulse:  (!) 120  (!) 123  Resp: (!) 21 20  (!) 25  Temp:    98.3 F (36.8 C)  TempSrc:    Oral  SpO2:  100% 100%   PainSc:        Isolation Precautions No active isolations  Medications Medications  0.9 %  sodium chloride infusion (has no administration in time range)  pantoprazole (PROTONIX) injection 40 mg (has no administration in time range)  sodium zirconium cyclosilicate (LOKELMA) packet 10 g (has no administration in time range)  Chlorhexidine Gluconate Cloth 2 % PADS 6 each (has no administration in time range)  calcium acetate (PHOSLO) capsule 1,334 mg (has no administration in time range)  ceFEPIme (MAXIPIME) 2 g in sodium chloride 0.9 % 100 mL IVPB (has no administration in time range)  amLODipine (NORVASC) tablet 10 mg (has no administration in time range)  metoprolol tartrate (LOPRESSOR) tablet 25 mg (has no administration in time range)  cinacalcet (SENSIPAR) tablet 30 mg (has no administration in time range)  ferric citrate (AURYXIA) tablet 420 mg (has no administration in time range)  albuterol (PROVENTIL) (2.5 MG/3ML) 0.083% nebulizer solution 2.5 mg (has no administration in time range)  ondansetron (ZOFRAN) tablet 4 mg (has no administration in time range)    Or  ondansetron (ZOFRAN) injection 4 mg (has no administration in time range)  acetaminophen (TYLENOL) tablet 650 mg (has no administration in time range)    Or  acetaminophen (TYLENOL) suppository 650 mg (has no administration in time range)    Mobility walks with device Moderate fall risk   Focused  Assessments Pulmonary Assessment Handoff:  Lung sounds: Bilateral Breath Sounds: Clear, Diminished O2 Device: Room Air        R Recommendations: See Admitting Provider Note  Report given to:   Additional Notes:

## 2018-09-29 NOTE — H&P (Signed)
History and Physical    Karen Morrow YNW:[id number] DOB: 27-Apr-1961 DOA: 09/29/2018  Referring MD/NP/PA: Dalia Heading, PA-C PCP: [name], MD  Patient coming from: home   Chief Complaint: Bloody stool  I have personally briefly reviewed patient's old medical records in [place]   HPI: Karen Morrow is a 58 y.o. female with medical history significant of ESRD on HD(T/TH/Sat), CHF, HTN, and GERD; who presents with complaints of having bloody bowel movement this morning.  Patient is somewhat of a poor historian.  Associated symptoms include diffuse abdominal pain and shortness of breath.  Last received hemodialysis yesterday, but reporting that she supposed to get dialyzed today as well.  Just recently hospitalized from 4/17-4/21 for MSSA bacteremia and sepsis to remain on IV cefepime until 5/4.  During that hospitalization it appears she also underwent EGD by Dr. Benson Norway revealing reflux esophagitis, nonbleeding chronic gastric ulcer, and nonbleeding duodenal diverticula.  Thereafter, she had a Palidrome catheter placed  into the right internal jugular vein placed for hemodialysis access on 4/28.   ED Course: Upon admission into the emergency department patient was noted to be afebrile, pulse 10 4-1 20, respirations 16-25, blood pressures maintained, and oxygen saturation 95 to 100% on room air.  Labs revealed WBC 14.9, hemoglobin 5.8(previously 10.2 on 4/28), platelets 132, potassium 5.2, potassium 5.2, CO2 21, BUN 61, creatinine 7.85, and anion gap 17. CT scan of the abdomen and pelvis showing no acute abnormality of the abdomen or pelvis.  Small right-sided pleural effusion and mild cardiomegaly noted with severe L5-S1 degenerative disc disease with moderate to severe bilateral neural foraminal stenosis.  Review of Systems  Constitutional: Negative for fever.  HENT: Negative for hearing loss.   Eyes: Negative for double vision.  Respiratory: Positive for shortness of  breath. Negative for hemoptysis.   Cardiovascular: Negative for leg swelling.  Gastrointestinal: Positive for abdominal pain, blood in stool and diarrhea.  Genitourinary: Negative for hematuria.  Skin: Negative for itching.  Neurological: Negative for loss of consciousness.  All other systems reviewed and are negative.   Past Medical History:  Diagnosis Date  . Anginal pain ([place])   . Asthma   . CHF (congestive heart failure) ([place])   . Chronic kidney disease    dialysis 3x wk  . Dyspnea   . Frequent bowel movements   . GERD (gastroesophageal reflux disease)   . Hepatitis C antibody test positive   . Hypertension    "just dx'd today" (11/03/2012)    Past Surgical History:  Procedure Laterality Date  . ABDOMINAL HYSTERECTOMY     "partial" (11/03/2012)  . AV FISTULA PLACEMENT Right 12/03/2013   Procedure: CREATION OF ARTERIOVENOUS (AV) FISTULA RIGHT ARM ;  Surgeon: Rosetta Posner, MD;  Location: [facility];  Service: Vascular;  Laterality: Right;  . AV FISTULA PLACEMENT Left 05/17/2018   Procedure: CREATION OF BRACHIOCEPHALIC FISTULA LEFT ARM;  Surgeon: Waynetta Sandy, MD;  Location: [facility];  Service: Vascular;  Laterality: Left;  . [place] REMOVAL Right 03/16/2018   Procedure: REMOVAL OF ARTERIOVENOUS GORETEX GRAFT ([place]) RIGHT ARM;  Surgeon: Waynetta Sandy, MD;  Location: [facility];  Service: Vascular;  Laterality: Right;  . BASCILIC VEIN TRANSPOSITION Right 03/06/2014   Procedure: SECOND STAGE BASILIC VEIN TRANSPOSITION;  Surgeon: Rosetta Posner, MD;  Location: [facility];  Service: Vascular;  Laterality: Right;  . ESOPHAGOGASTRODUODENOSCOPY N/A 06/05/2013   Procedure: ESOPHAGOGASTRODUODENOSCOPY (EGD);  Surgeon: Winfield Cunas., MD;  Location: [facility];  Service: Endoscopy;  Laterality: N/A;  . ESOPHAGOGASTRODUODENOSCOPY N/A 09/08/2016   Procedure: ESOPHAGOGASTRODUODENOSCOPY (EGD);  Surgeon: Milus Banister, MD;  Location: [facility];  Service: Endoscopy;  Laterality: N/A;  .  ESOPHAGOGASTRODUODENOSCOPY (EGD) WITH PROPOFOL N/A 09/17/2018   Procedure: ESOPHAGOGASTRODUODENOSCOPY (EGD) WITH PROPOFOL;  Surgeon: Carol Ada, MD;  Location: [facility];  Service: Endoscopy;  Laterality: N/A;  . INSERTION OF DIALYSIS CATHETER Left 03/16/2018   Procedure: PLACEMENT OF TUNNEL DIALYSIS CATHETER;  Surgeon: Waynetta Sandy, MD;  Location: [facility];  Service: Vascular;  Laterality: Left;  . INSERTION OF DIALYSIS CATHETER Right 09/26/2018   Procedure: INSERTION OF DIALYSIS  TUNNELED CATHETER;  Surgeon: Elam Dutch, MD;  Location: [facility] OR;  Service: Vascular;  Laterality: Right;  . IR REMOVAL TUN CV CATH W/O FL  09/17/2018  . REVISON OF ARTERIOVENOUS FISTULA Right 01/28/2017   Procedure: REVISON OF RIGHT ARM ARTERIOVENOUS FISTULA USING 8MMX10CM GORETEX GRAFT;  Surgeon: Angelia Mould, MD;  Location: [place];  Service: Vascular;  Laterality: Right;     reports that she has been smoking cigarettes. She has a 10.00 pack-year smoking history. She has never used smokeless tobacco. She reports previous alcohol use of about 1.0 standard drinks of alcohol per week. She reports previous drug use. Drug: Marijuana.  Allergies  Allergen Reactions  . Cefazolin Other (See Comments)    Severe thrombocytopenia (has tolerated zosyn in the past) ID aware Tolerated in October 2019    Family History  Problem Relation Age of Onset  . Lupus Mother   . Diabetes Father   . Heart attack Father     Prior to Admission medications   Medication Sig Start Date End Date Taking? Authorizing Provider  acetaminophen (TYLENOL) 500 MG tablet Take 500-1,000 mg by mouth every 6 (six) hours as needed for headache (pain).   Yes [provider]  albuterol (PROVENTIL HFA;VENTOLIN HFA) 108 (90 Base) MCG/ACT inhaler TAKE 2 PUFFS BY MOUTH EVERY 6 HOURS AS NEEDED FOR WHEEZE OR SHORTNESS OF BREATH Patient taking differently: Inhale 2 puffs into the lungs every 6 (six) hours as needed for  wheezing.  05/09/18  Yes [name], MD  albuterol (PROVENTIL) (2.5 MG/3ML) 0.083% nebulizer solution Take 3 mLs (2.5 mg total) by nebulization every 6 (six) hours as needed for wheezing or shortness of breath. MUST MAKE APPT FOR FURTHER REFILLS 08/15/18  Yes [name], MD  Amino Acids-Protein Hydrolys (FEEDING SUPPLEMENT, PRO-STAT SUGAR FREE 64,) LIQD Take 30 mLs by mouth 2 (two) times daily. 09/19/18  Yes Hosie Poisson, MD  amLODipine (NORVASC) 10 MG tablet Take 1 tablet (10 mg total) by mouth daily. Patient taking differently: Take 10 mg by mouth at bedtime.  05/16/18  Yes Thurnell Lose, MD  b complex-vitamin c-folic acid (NEPHRO-VITE) 0.8 MG TABS tablet Take 1 tablet by mouth every evening. 07/27/18  Yes [provider]  ceFEPIme 2 g in sodium chloride 0.9 % 100 mL Inject 2 g into the vein Every Tuesday,Thursday,and Saturday with dialysis for 11 days. 09/21/18 10/02/18 Yes Hosie Poisson, MD  cinacalcet (SENSIPAR) 30 MG tablet TAKE 1 TABLET BY MOUTH DAILY EVERY EVENING (DO NOT TAKE LESS THAN 12 HOURS PRIOR TO DIALYSIS) 09/19/18  Yes Hosie Poisson, MD  ferric citrate (AURYXIA) 1 GM 210 MG(Fe) tablet Take 420 mg by mouth 3 (three) times daily with meals.   Yes [provider]  metoprolol tartrate (LOPRESSOR) 25 MG tablet Take 25 mg by mouth 2 (two) times daily.   Yes  [provider]  oxyCODONE-acetaminophen (PERCOCET/ROXICET) 5-325 MG tablet Take 1 tablet by mouth every 6 (six) hours as needed for severe pain. 09/26/18  Yes Fields, Jessy Oto, MD  pantoprazole (PROTONIX) 40 MG tablet Take 1 tablet (40 mg total) by mouth 2 (two) times daily. 09/19/18  Yes Hosie Poisson, MD  calcium acetate (PHOSLO) 667 MG capsule Take 2 capsules (1,334 mg total) by mouth 3 (three) times daily with meals. Patient not taking: Reported on 09/29/2018 09/19/18   Hosie Poisson, MD    Physical Exam:  Constitutional: thin chronically ill appearing female  Vitals:   09/29/18 1345  09/29/18 1400 09/29/18 1430 09/29/18 1445  BP: 113/74 121/81 112/85 115/83  Pulse:      Resp: (!) 22 18 (!) 21 20  Temp:      TempSrc:      SpO2:       Eyes: PERRL, lids and conjunctivae normal ENMT: Mucous membranes are moist. Posterior pharynx clear of any exudate or lesions.Normal dentition.  Neck: normal, supple, no masses, no thyromegaly Respiratory: decreased aeration, mildly tachypneic, no significant wheeze noted. Cardiovascular: Regular rate and rhythm, no murmurs / rubs / gallops. No extremity edema. 2+ pedal pulses. No carotid bruits. Right IJ catheter in place, Abdomen: no tenderness, no masses palpated. No hepatosplenomegaly. Bowel sounds positive.  Musculoskeletal: no clubbing / cyanosis. No joint deformity upper and lower extremities. Good ROM, no contractures. Normal muscle tone.  Skin: no rashes, lesions, ulcers. No induration Neurologic: CN 2-12 grossly intact. Sensation intact, DTR normal. Strength 5/5 in all 4.  Psychiatric: Normal judgment and insight. Alert and oriented x 3. Normal mood.     Labs on Admission: I have personally reviewed following labs and imaging studies  CBC: Recent Labs  Lab 09/26/18 1002 09/29/18 1046  WBC  --  14.9*  NEUTROABS  --  12.9*  HGB 10.2* 5.8*  HCT 30.0* 17.2*  MCV  --  92.5  PLT  --  096*   Basic Metabolic Panel: Recent Labs  Lab 09/26/18 1002 09/29/18 1046  NA 135 135  K 4.8 5.2*  CL  --  97*  CO2  --  21*  GLUCOSE 79 77  BUN  --  61*  CREATININE  --  7.85*  CALCIUM  --  8.9   GFR: Estimated Creatinine Clearance: 6.9 mL/min (A) (by C-G formula based on SCr of 7.85 mg/dL (H)). Liver Function Tests: Recent Labs  Lab 09/29/18 1046  AST 32  ALT 20  ALKPHOS 58  BILITOT 0.7  PROT 5.5*  ALBUMIN 2.3*   No results for input(s): LIPASE, AMYLASE in the last 168 hours. No results for input(s): AMMONIA in the last 168 hours. Coagulation Profile: No results for input(s): INR, PROTIME in the last 168 hours.  Cardiac Enzymes: No results for input(s): CKTOTAL, CKMB, CKMBINDEX, TROPONINI in the last 168 hours. BNP (last 3 results) No results for input(s): PROBNP in the last 8760 hours. HbA1C: No results for input(s): HGBA1C in the last 72 hours. CBG: No results for input(s): GLUCAP in the last 168 hours. Lipid Profile: No results for input(s): CHOL, HDL, LDLCALC, TRIG, CHOLHDL, LDLDIRECT in the last 72 hours. Thyroid Function Tests: No results for input(s): TSH, T4TOTAL, FREET4, T3FREE, THYROIDAB in the last 72 hours. Anemia Panel: No results for input(s): VITAMINB12, FOLATE, FERRITIN, TIBC, IRON, RETICCTPCT in the last 72 hours. Urine analysis:    Component Value Date/Time   COLORURINE YELLOW 11/26/2013 1819   APPEARANCEUR TURBID (A) 11/26/2013 1819  LABSPEC 1.021 11/26/2013 1819   PHURINE 5.5 11/26/2013 1819   GLUCOSEU NEGATIVE 11/26/2013 1819   HGBUR SMALL (A) 11/26/2013 1819   BILIRUBINUR NEGATIVE 11/26/2013 1819   KETONESUR NEGATIVE 11/26/2013 1819   PROTEINUR >300 (A) 11/26/2013 1819   UROBILINOGEN 0.2 11/26/2013 1819   NITRITE POSITIVE (A) 11/26/2013 1819   LEUKOCYTESUR MODERATE (A) 11/26/2013 1819   Sepsis Labs: No results found for this or any previous visit (from the past 240 hour(s)).   Radiological Exams on Admission: Ct Abdomen Pelvis Wo Contrast  Result Date: 09/29/2018 CLINICAL DATA:  Acute abdominal pain.  Bloody stool. EXAM: CT ABDOMEN AND PELVIS WITHOUT CONTRAST TECHNIQUE: Multidetector CT imaging of the abdomen and pelvis was performed following the standard protocol without IV contrast. COMPARISON:  None. FINDINGS: LOWER CHEST: Small right basilar effusion.  Mild cardiomegaly. HEPATOBILIARY: The hepatic contours and density are normal. There is no intra- or extrahepatic biliary dilatation. Gallbladder absent or nondistended. PANCREAS: The pancreatic parenchymal contours are normal and there is no ductal dilatation. There is no peripancreatic fluid collection. SPLEEN:  Normal. ADRENALS/URINARY TRACT: --Adrenal glands: Normal. --Right kidney/ureter: Generalized atrophy with multiple calcifications. --Left kidney/ureter: Generalized atrophy with multiple calcifications. --Urinary bladder: Normal for degree of distention STOMACH/BOWEL: --Stomach/Duodenum: There is no hiatal hernia or other gastric abnormality. The duodenal course and caliber are normal. --Small bowel: No dilatation or inflammation. --Colon: No focal abnormality. --Appendix: Normal. VASCULAR/LYMPHATIC: There is aortic atherosclerosis without hemodynamically significant stenosis. No abdominal or pelvic lymphadenopathy. REPRODUCTIVE: Status post hysterectomy. No adnexal mass. MUSCULOSKELETAL. Severe L5-S1 disc space narrowing with left-greater-than-right neural foraminal stenosis. OTHER: None. IMPRESSION: 1. No acute abnormality of the abdomen or pelvis. 2. Small right pleural effusion and mild cardiomegaly. 3. Severe L5-S1 degenerative disc disease with moderate-to-severe bilateral neural foraminal stenosis. 4. Bilateral renal atrophy. Electronically Signed   By: Ulyses Jarred M.D.   On: 09/29/2018 14:33      Assessment/Plan GI bleed, acute blood loss anemia: Patient presents with complaints of abdominal pain found to have hemoglobin down to 5.8 g/dL.  Previously noted to be 10.2 g/dL on 4/28.  Just recently had an EGD performed by Dr. Benson Norway.  Typed and screened and ordered 2 units of packed red blood cells. -Admit to a telemetry bed -Continuous pulse oximetry with nasal cannula oxygen as needed -Continue transfusion of 1 units of packed red blood cells -Protonix IV 40 mg twice daily -GI to be consulted, plan for EGD in a.m.  Abdominal pain: acute on chronic. Could be secondary to Ulcer.  History of esophagitis, gastric ulcer, diverticulum of the duodenum: As seen on last EGD performed by Dr. Benson Norway on 09/17/2018.  Hyperkalemia: Acute. on admission potassium mildly elevated at 5.2. -Continue to monitor   MSSA bacteremia  -Continue cefepime to complete course on 5/4  ESRD on HD: Patient normally dialyzes T/Th/Sat. -Continue Sensipar and PhosLo -Dr. Henrietta Dine of nephrology consulted -Hemodialysis per nephrology  Essential hypertension -Continue amlodipine and metoprolol as tolerated  Degenerative disc disease with spinal stenosis: Incidental finding seen on CT scan noting severe degenerative disc disease of L5-S1 with moderate to severe bilateral neural foraminal stenosis.  Chronic Hepatitis C  DVT prophylaxis: SCDs Code Status: full Family Communication: no family present at bedside  Disposition Plan: Discharge home in 2-3days Consults called: Gastroenterology, nephrology Admission status: observation  Norval Morton MD [facility] Pager [phone]   If 7PM-7AM, please contact night-coverage [email or web address] Password [email or web address]  09/29/2018, 3:08 PM

## 2018-09-30 ENCOUNTER — Encounter (HOSPITAL_COMMUNITY)
Admission: EM | Disposition: A | Discharge status: Home / Self Care | Payer: Self-pay | Source: Home / Self Care | Attending: Internal Medicine

## 2018-09-30 ENCOUNTER — Observation Stay (HOSPITAL_COMMUNITY): Payer: Medicare Other | Admitting: Anesthesiology

## 2018-09-30 ENCOUNTER — Encounter (HOSPITAL_COMMUNITY): Payer: Self-pay

## 2018-09-30 DIAGNOSIS — D631 Anemia in chronic kidney disease: Secondary | ICD-10-CM | POA: Diagnosis not present

## 2018-09-30 DIAGNOSIS — Z90710 Acquired absence of both cervix and uterus: Secondary | ICD-10-CM | POA: Diagnosis not present

## 2018-09-30 DIAGNOSIS — M5136 Other intervertebral disc degeneration, lumbar region: Secondary | ICD-10-CM | POA: Diagnosis present

## 2018-09-30 DIAGNOSIS — B182 Chronic viral hepatitis C: Secondary | ICD-10-CM | POA: Diagnosis present

## 2018-09-30 DIAGNOSIS — K210 Gastro-esophageal reflux disease with esophagitis: Secondary | ICD-10-CM | POA: Diagnosis present

## 2018-09-30 DIAGNOSIS — K7460 Unspecified cirrhosis of liver: Secondary | ICD-10-CM | POA: Diagnosis present

## 2018-09-30 DIAGNOSIS — E875 Hyperkalemia: Secondary | ICD-10-CM | POA: Diagnosis present

## 2018-09-30 DIAGNOSIS — K3189 Other diseases of stomach and duodenum: Secondary | ICD-10-CM | POA: Diagnosis not present

## 2018-09-30 DIAGNOSIS — K2960 Other gastritis without bleeding: Secondary | ICD-10-CM | POA: Diagnosis present

## 2018-09-30 DIAGNOSIS — K921 Melena: Secondary | ICD-10-CM | POA: Diagnosis not present

## 2018-09-30 DIAGNOSIS — K219 Gastro-esophageal reflux disease without esophagitis: Secondary | ICD-10-CM | POA: Diagnosis present

## 2018-09-30 DIAGNOSIS — Z992 Dependence on renal dialysis: Secondary | ICD-10-CM | POA: Diagnosis not present

## 2018-09-30 DIAGNOSIS — Z8249 Family history of ischemic heart disease and other diseases of the circulatory system: Secondary | ICD-10-CM | POA: Diagnosis not present

## 2018-09-30 DIAGNOSIS — Z79899 Other long term (current) drug therapy: Secondary | ICD-10-CM | POA: Diagnosis not present

## 2018-09-30 DIAGNOSIS — I5032 Chronic diastolic (congestive) heart failure: Secondary | ICD-10-CM | POA: Diagnosis present

## 2018-09-30 DIAGNOSIS — R109 Unspecified abdominal pain: Secondary | ICD-10-CM | POA: Diagnosis not present

## 2018-09-30 DIAGNOSIS — I120 Hypertensive chronic kidney disease with stage 5 chronic kidney disease or end stage renal disease: Secondary | ICD-10-CM | POA: Diagnosis not present

## 2018-09-30 DIAGNOSIS — K2971 Gastritis, unspecified, with bleeding: Secondary | ICD-10-CM | POA: Diagnosis not present

## 2018-09-30 DIAGNOSIS — Z881 Allergy status to other antibiotic agents status: Secondary | ICD-10-CM | POA: Diagnosis not present

## 2018-09-30 DIAGNOSIS — J45909 Unspecified asthma, uncomplicated: Secondary | ICD-10-CM | POA: Diagnosis present

## 2018-09-30 DIAGNOSIS — K922 Gastrointestinal hemorrhage, unspecified: Secondary | ICD-10-CM | POA: Diagnosis not present

## 2018-09-30 DIAGNOSIS — D500 Iron deficiency anemia secondary to blood loss (chronic): Secondary | ICD-10-CM | POA: Diagnosis not present

## 2018-09-30 DIAGNOSIS — I132 Hypertensive heart and chronic kidney disease with heart failure and with stage 5 chronic kidney disease, or end stage renal disease: Secondary | ICD-10-CM | POA: Diagnosis present

## 2018-09-30 DIAGNOSIS — K254 Chronic or unspecified gastric ulcer with hemorrhage: Secondary | ICD-10-CM | POA: Diagnosis present

## 2018-09-30 DIAGNOSIS — K760 Fatty (change of) liver, not elsewhere classified: Secondary | ICD-10-CM | POA: Diagnosis present

## 2018-09-30 DIAGNOSIS — R7881 Bacteremia: Secondary | ICD-10-CM | POA: Diagnosis not present

## 2018-09-30 DIAGNOSIS — F17210 Nicotine dependence, cigarettes, uncomplicated: Secondary | ICD-10-CM | POA: Diagnosis present

## 2018-09-30 DIAGNOSIS — N186 End stage renal disease: Secondary | ICD-10-CM | POA: Diagnosis present

## 2018-09-30 DIAGNOSIS — D62 Acute posthemorrhagic anemia: Secondary | ICD-10-CM | POA: Diagnosis present

## 2018-09-30 DIAGNOSIS — K5710 Diverticulosis of small intestine without perforation or abscess without bleeding: Secondary | ICD-10-CM | POA: Diagnosis present

## 2018-09-30 HISTORY — PX: BIOPSY: SHX5522

## 2018-09-30 HISTORY — PX: ESOPHAGOGASTRODUODENOSCOPY (EGD) WITH PROPOFOL: SHX5813

## 2018-09-30 LAB — CBC
HCT: 20.1 % — ABNORMAL LOW (ref 36.0–46.0)
HCT: 24.4 % — ABNORMAL LOW (ref 36.0–46.0)
Hemoglobin: 6.6 g/dL — CL (ref 12.0–15.0)
Hemoglobin: 8.5 g/dL — ABNORMAL LOW (ref 12.0–15.0)
MCH: 29.6 pg (ref 26.0–34.0)
MCH: 30.8 pg (ref 26.0–34.0)
MCHC: 32.8 g/dL (ref 30.0–36.0)
MCHC: 34.8 g/dL (ref 30.0–36.0)
MCV: 90.1 fL (ref 80.0–100.0)
MCV: 88.4 fL (ref 80.0–100.0)
Platelets: 124 10*3/uL — ABNORMAL LOW (ref 150–400)
Platelets: 96 10*3/uL — ABNORMAL LOW (ref 150–400)
RBC: 2.23 MIL/uL — ABNORMAL LOW (ref 3.87–5.11)
RBC: 2.76 MIL/uL — ABNORMAL LOW (ref 3.87–5.11)
RDW: 20.3 % — ABNORMAL HIGH (ref 11.5–15.5)
RDW: 19 % — ABNORMAL HIGH (ref 11.5–15.5)
WBC: 14.5 10*3/uL — ABNORMAL HIGH (ref 4.0–10.5)
WBC: 11.6 10*3/uL — ABNORMAL HIGH (ref 4.0–10.5)
nRBC: 1 % — ABNORMAL HIGH (ref 0.0–0.2)
nRBC: 0.7 % — ABNORMAL HIGH (ref 0.0–0.2)

## 2018-09-30 LAB — BASIC METABOLIC PANEL
Anion gap: 16 — ABNORMAL HIGH (ref 5–15)
BUN: 74 mg/dL — ABNORMAL HIGH (ref 6–20)
CO2: 21 mmol/L — ABNORMAL LOW (ref 22–32)
Calcium: 8.6 mg/dL — ABNORMAL LOW (ref 8.9–10.3)
Chloride: 100 mmol/L (ref 98–111)
Creatinine, Ser: 8.81 mg/dL — ABNORMAL HIGH (ref 0.44–1.00)
GFR calc Af Amer: 5 mL/min — ABNORMAL LOW (ref >60)
GFR calc non Af Amer: 4 mL/min — ABNORMAL LOW (ref >60)
Glucose, Bld: 78 mg/dL (ref 70–99)
Potassium: 4.6 mmol/L (ref 3.5–5.1)
Sodium: 137 mmol/L (ref 135–145)

## 2018-09-30 LAB — PREPARE RBC (CROSSMATCH)

## 2018-09-30 SURGERY — ESOPHAGOGASTRODUODENOSCOPY (EGD) WITH PROPOFOL
Anesthesia: Monitor Anesthesia Care

## 2018-09-30 MED ORDER — HEPARIN SODIUM (PORCINE) 1000 UNIT/ML IJ SOLN
1000.0000 [IU] | INTRAMUSCULAR | Status: DC | PRN
  Administered 2018-09-30: 11:00:00 3400 [IU] via INTRAVENOUS
  Filled 2018-09-30 (×2): qty 1

## 2018-09-30 MED ORDER — SUCRALFATE 1 G PO TABS
1.0000 g | ORAL_TABLET | Freq: Three times a day (TID) | ORAL | Status: DC
  Administered 2018-09-30 – 2018-10-01 (×3): 1 g via ORAL
  Filled 2018-09-30 (×6): qty 1

## 2018-09-30 MED ORDER — HEPARIN SODIUM (PORCINE) 1000 UNIT/ML IJ SOLN
INTRAMUSCULAR | Status: AC
  Administered 2018-09-30: 3400 [IU] via INTRAVENOUS
  Filled 2018-09-30: qty 4

## 2018-09-30 MED ORDER — NEPRO/CARBSTEADY PO LIQD
237.0000 mL | Freq: Two times a day (BID) | ORAL | Status: DC
  Administered 2018-10-01: 237 mL via ORAL

## 2018-09-30 MED ORDER — PROPOFOL 10 MG/ML IV BOLUS
INTRAVENOUS | Status: DC | PRN
  Administered 2018-09-30: 20 mg via INTRAVENOUS

## 2018-09-30 MED ORDER — SODIUM CHLORIDE 0.9 % IV SOLN
INTRAVENOUS | Status: DC | PRN
  Administered 2018-09-30: 12:00:00 via INTRAVENOUS

## 2018-09-30 MED ORDER — PROPOFOL 500 MG/50ML IV EMUL
INTRAVENOUS | Status: DC | PRN
  Administered 2018-09-30: 100 ug/kg/min via INTRAVENOUS

## 2018-09-30 MED ORDER — SODIUM CHLORIDE 0.9% IV SOLUTION: Freq: Once | INTRAVENOUS | Status: DC

## 2018-09-30 MED ORDER — RENA-VITE PO TABS
1.0000 | ORAL_TABLET | Freq: Every day | ORAL | Status: DC
  Administered 2018-09-30: 1 via ORAL
  Filled 2018-09-30: qty 1

## 2018-09-30 MED ORDER — ONDANSETRON HCL 4 MG/2ML IJ SOLN
INTRAMUSCULAR | Status: DC | PRN
  Administered 2018-09-30: 4 mg via INTRAVENOUS

## 2018-09-30 SURGICAL SUPPLY — 15 items

## 2018-09-30 NOTE — Progress Notes (Signed)
Patient back in 5w-04 from endoscopy. Patient alert and oriented x 4. Vital signs stable. Lunch tray ordered for patient. Call bell within reach. Patient instructed to return call to husband as he called while she was off unit. Denies any complaints currently. Will continue to monitor.   Hiram Comber, RN 09/30/2018 12:47 PM

## 2018-09-30 NOTE — Progress Notes (Signed)
Received call from central telemetry stating patient had 17 beat run of SVT. Patient currently resting in bed and asymomatic. BP 105/62, HR 80's in NSR. Dr. Cruzita Lederer paged to notify of situation. Will continue to monitor.   Hiram Comber, RN 09/30/2018 3:15 PM

## 2018-09-30 NOTE — Progress Notes (Signed)
Spoke with RN on 5W, reports pt. Went to dialysis about 30 min prior to call. Will notify GI MD that this may delay EGD.

## 2018-09-30 NOTE — Anesthesia Preprocedure Evaluation (Signed)
Anesthesia Evaluation  Patient identified by MRN, date of birth, ID band Patient awake    Reviewed: Allergy & Precautions, NPO status , Patient's Chart, lab work & pertinent test results  History of Anesthesia Complications Negative for: history of anesthetic complications  Airway Mallampati: II  TM Distance: >3 FB Neck ROM: Full    Dental no notable dental hx. (+) Dental Advisory Given   Pulmonary asthma , Current Smoker,    Pulmonary exam normal breath sounds clear to auscultation       Cardiovascular hypertension, Pt. on medications  Rhythm:Regular Rate:Normal + Systolic murmurs    Neuro/Psych negative neurological ROS  negative psych ROS   GI/Hepatic PUD, GERD  ,(+) Cirrhosis       , Hepatitis -, C  Endo/Other  negative endocrine ROS  Renal/GU DialysisRenal disease  negative genitourinary   Musculoskeletal negative musculoskeletal ROS (+)   Abdominal   Peds negative pediatric ROS (+)  Hematology  (+) anemia ,   Anesthesia Other Findings   Reproductive/Obstetrics negative OB ROS                             Anesthesia Physical  Anesthesia Plan  ASA: IV  Anesthesia Plan: MAC   Post-op Pain Management:    Induction: Intravenous  PONV Risk Score and Plan: 2 and Ondansetron and Propofol infusion  Airway Management Planned: Simple Face Mask  Additional Equipment:   Intra-op Plan:   Post-operative Plan:   Informed Consent: I have reviewed the patients History and Physical, chart, labs and discussed the procedure including the risks, benefits and alternatives for the proposed anesthesia with the patient or authorized representative who has indicated his/her understanding and acceptance.     Dental advisory given  Plan Discussed with: CRNA and Surgeon  Anesthesia Plan Comments:         Anesthesia Quick Evaluation

## 2018-09-30 NOTE — Op Note (Signed)
[facility] Patient Name: Karen Morrow Procedure Date : 09/30/2018 MRN: [id number] Attending MD: Mauri Pole , MD Date of Birth: December 18, 1960 CSN: [id number] Age: 58 Admit Type: Inpatient Procedure:                Upper GI endoscopy Indications:              Active gastrointestinal bleeding, Melena Providers:                Mauri Pole, MD, Raynelle Bring, RN, Ladona Ridgel, Technician, Charolette Child, Technician,                            Clearnce Sorrel, CRNA Referring MD:              Medicines:                Monitored Anesthesia Care Complications:            No immediate complications. Estimated Blood Loss:     Estimated blood loss was minimal. Procedure:                Pre-Anesthesia Assessment:                           - Prior to the procedure, a History and Physical                            was performed, and patient medications and                            allergies were reviewed. The patient's tolerance of                            previous anesthesia was also reviewed. The risks                            and benefits of the procedure and the sedation                            options and risks were discussed with the patient.                            All questions were answered, and informed consent                            was obtained. Prior Anticoagulants: The patient has                            taken no previous anticoagulant or antiplatelet                            agents. ASA Grade Assessment: IV - A patient with  severe systemic disease that is a constant threat                            to life. After reviewing the risks and benefits,                            the patient was deemed in satisfactory condition to                            undergo the procedure.                           After obtaining informed consent, the endoscope was                            passed  under direct vision. Throughout the                            procedure, the patient's blood pressure, pulse, and                            oxygen saturations were monitored continuously. The                            GIF-H190 ([id number]) Olympus gastroscope was                            introduced through the mouth, and advanced to the                            second part of duodenum. The upper GI endoscopy was                            accomplished without difficulty. The patient                            tolerated the procedure well. Scope In: Scope Out: Findings:      LA Grade C (one or more mucosal breaks continuous between tops of 2 or       more mucosal folds, less than 75% circumference) esophagitis with no       bleeding was found 34 to 37 cm from the incisors.      A small hiatal hernia was present.      One non-bleeding cratered gastric ulcer with a clean ulcer base (Forrest       Class III) was found in the prepyloric region of the stomach. The lesion       was 3 mm in largest dimension.      Scattered moderate inflammation characterized by congestion (edema),       erosions, erythema and friability was found in the gastric antrum, in       the prepyloric region of the stomach and at the pylorus. Biopsies were       taken with a cold forceps for histology.      The cardia and gastric fundus were normal on retroflexion. Random  gastric biopsies from antrum and body for H.pylori testing.      A 9 mm non-bleeding diverticulum was found in the first portion of the       duodenum.      The second portion of the duodenum was normal. Impression:               - LA Grade C reflux esophagitis.                           - Small hiatal hernia.                           - Non-bleeding gastric ulcer with a clean ulcer                            base (Forrest Class III).                           - Erosive nodular gastritis. Biopsied.                           - Non-bleeding  duodenal diverticulum.                           - Normal second portion of the duodenum. Recommendation:           - Resume previous diet.                           - Continue present medications.                           - No ibuprofen, naproxen, or other non-steroidal                            anti-inflammatory drugs.                           - Await pathology results.                           - Use Protonix (pantoprazole) 40 mg PO BID.                           - Use sucralfate tablets 1 gram PO QID for 4 weeks.                           - Follow an antireflux regimen indefinitely. Procedure Code(s):        --- Professional ---                           [phone], Esophagogastroduodenoscopy, flexible,                            transoral; with biopsy, single or multiple Diagnosis Code(s):        --- Professional ---  K21.0, Gastro-esophageal reflux disease with                            esophagitis                           K44.9, Diaphragmatic hernia without obstruction or                            gangrene                           K25.9, Gastric ulcer, unspecified as acute or                            chronic, without hemorrhage or perforation                           K29.60, Other gastritis without bleeding                           K92.2, Gastrointestinal hemorrhage, unspecified                           K92.1, Melena (includes Hematochezia)                           K57.10, Diverticulosis of small intestine without                            perforation or abscess without bleeding CPT copyright 2019 [employer]. All rights reserved. The codes documented in this report are preliminary and upon coder review may  be revised to meet current compliance requirements. Mauri Pole, MD 09/30/2018 12:22:04 PM This report has been signed electronically. Number of Addenda: 0

## 2018-09-30 NOTE — Progress Notes (Signed)
Noticed patient's H&H was never re-checked following the 1 unit of prbc that the patient received in dialysis this morning. Dr. Cruzita Lederer paged and ordered repeat CBC. Awaiting results.   Hiram Comber, RN 09/30/2018 6:36 PM

## 2018-09-30 NOTE — Consult Note (Signed)
Renal Service Consult Note [facility]  Karen Morrow 09/30/2018 [place] Requesting Physician:  Dr Cruzita Lederer  Reason for Consult:  ESRD pt w/ bloody stool HPI: The patient is a 58 y.o. year-old with hx of ESRD, cirrhosis/ hep C, MSSA cath sepsis mid April 2020 presented for bloody stool. Also abd pain, SOB.  Missed HD on 4/29.  Hb was 5.8.  Pt admitted and got 1u prbc's, to get 2nd unit on HD today.  Asked to see for ESRD.    Pt on HD now.  Having some abd discomfort, no CP or SOB.  AVF infiltrated and using TDC now.     Echart:  admit 11/2013 for AKI now esrd on HD, acute GIB, cirrhosis w/o etoh, low plts, HTN   admit 4/02018 for GI bleed hx gast ulcer and cirrhosis, treated w/ IV PPI/ octreotide , egd +gastric ulcer, improved. ESRD on HD TTS.   admit 02/2018 for MSSA bacteremia rx'd IV Ancef  admit 04/2018 for resp failure/ pulm edema/ htn crisis, got IV ntg and po bp meds and dialysis  admit 08/2018 for fevers/ sepsis and had MSSA cath sepsis. covid neg x 2. TDC was removed and AVF was used. H/o low plts to Ancef. Rx'd Cefipime w/ HD through 10/02/18 (2 wks per ID).    ROS  denies CP  no joint pain   no HA  no blurry vision  no rash  no diarrhea  no nausea/ vomiting   Past Medical History  Past Medical History:  Diagnosis Date  . Anginal pain ([place])   . Asthma   . CHF (congestive heart failure) ([place])   . Chronic kidney disease    dialysis 3x wk  . Dyspnea   . Frequent bowel movements   . GERD (gastroesophageal reflux disease)   . Hepatitis C antibody test positive   . Hypertension    "just dx'd today" (11/03/2012)   Past Surgical History  Past Surgical History:  Procedure Laterality Date  . ABDOMINAL HYSTERECTOMY     "partial" (11/03/2012)  . AV FISTULA PLACEMENT Right 12/03/2013   Procedure: CREATION OF ARTERIOVENOUS (AV) FISTULA RIGHT ARM ;  Surgeon: Rosetta Posner, MD;  Location: [place];  Service: Vascular;  Laterality: Right;  . AV FISTULA  PLACEMENT Left 05/17/2018   Procedure: CREATION OF BRACHIOCEPHALIC FISTULA LEFT ARM;  Surgeon: Waynetta Sandy, MD;  Location: [place];  Service: Vascular;  Laterality: Left;  . [place] REMOVAL Right 03/16/2018   Procedure: REMOVAL OF ARTERIOVENOUS GORETEX GRAFT ([place]) RIGHT ARM;  Surgeon: Waynetta Sandy, MD;  Location: [place];  Service: Vascular;  Laterality: Right;  . BASCILIC VEIN TRANSPOSITION Right 03/06/2014   Procedure: SECOND STAGE BASILIC VEIN TRANSPOSITION;  Surgeon: Rosetta Posner, MD;  Location: [facility] OR;  Service: Vascular;  Laterality: Right;  . ESOPHAGOGASTRODUODENOSCOPY N/A 06/05/2013   Procedure: ESOPHAGOGASTRODUODENOSCOPY (EGD);  Surgeon: Winfield Cunas., MD;  Location: [facility];  Service: Endoscopy;  Laterality: N/A;  . ESOPHAGOGASTRODUODENOSCOPY N/A 09/08/2016   Procedure: ESOPHAGOGASTRODUODENOSCOPY (EGD);  Surgeon: Milus Banister, MD;  Location: [place];  Service: Endoscopy;  Laterality: N/A;  . ESOPHAGOGASTRODUODENOSCOPY (EGD) WITH PROPOFOL N/A 09/17/2018   Procedure: ESOPHAGOGASTRODUODENOSCOPY (EGD) WITH PROPOFOL;  Surgeon: Carol Ada, MD;  Location: [place];  Service: Endoscopy;  Laterality: N/A;  . INSERTION OF DIALYSIS CATHETER Left 03/16/2018   Procedure: PLACEMENT OF TUNNEL DIALYSIS CATHETER;  Surgeon: Waynetta Sandy, MD;  Location: [place];  Service: Vascular;  Laterality: Left;  .  INSERTION OF DIALYSIS CATHETER Right 09/26/2018   Procedure: INSERTION OF DIALYSIS  TUNNELED CATHETER;  Surgeon: Elam Dutch, MD;  Location: [facility] OR;  Service: Vascular;  Laterality: Right;  . IR REMOVAL TUN CV CATH W/O FL  09/17/2018  . REVISON OF ARTERIOVENOUS FISTULA Right 01/28/2017   Procedure: REVISON OF RIGHT ARM ARTERIOVENOUS FISTULA USING 8MMX10CM GORETEX GRAFT;  Surgeon: Angelia Mould, MD;  Location: [facility] OR;  Service: Vascular;  Laterality: Right;   Family History  Family History  Problem Relation Age of Onset  . Lupus Mother   .  Diabetes Father   . Heart attack Father    Social History  reports that she has been smoking cigarettes. She has a 10.00 pack-year smoking history. She has never used smokeless tobacco. She reports previous alcohol use of about 1.0 standard drinks of alcohol per week. She reports previous drug use. Drug: Marijuana. Allergies  Allergies  Allergen Reactions  . Cefazolin Other (See Comments)    Severe thrombocytopenia (has tolerated zosyn in the past) ID aware Tolerated in October 2019   Home medications Prior to Admission medications   Medication Sig Start Date End Date Taking? Authorizing Provider  acetaminophen (TYLENOL) 500 MG tablet Take 500-1,000 mg by mouth every 6 (six) hours as needed for headache (pain).   Yes [provider]  albuterol (PROVENTIL HFA;VENTOLIN HFA) 108 (90 Base) MCG/ACT inhaler TAKE 2 PUFFS BY MOUTH EVERY 6 HOURS AS NEEDED FOR WHEEZE OR SHORTNESS OF BREATH Patient taking differently: Inhale 2 puffs into the lungs every 6 (six) hours as needed for wheezing.  05/09/18  Yes [name], MD  albuterol (PROVENTIL) (2.5 MG/3ML) 0.083% nebulizer solution Take 3 mLs (2.5 mg total) by nebulization every 6 (six) hours as needed for wheezing or shortness of breath. MUST MAKE APPT FOR FURTHER REFILLS 08/15/18  Yes [name], MD  Amino Acids-Protein Hydrolys (FEEDING SUPPLEMENT, PRO-STAT SUGAR FREE 64,) LIQD Take 30 mLs by mouth 2 (two) times daily. 09/19/18  Yes Hosie Poisson, MD  amLODipine (NORVASC) 10 MG tablet Take 1 tablet (10 mg total) by mouth daily. Patient taking differently: Take 10 mg by mouth at bedtime.  05/16/18  Yes Thurnell Lose, MD  b complex-vitamin c-folic acid (NEPHRO-VITE) 0.8 MG TABS tablet Take 1 tablet by mouth every evening. 07/27/18  Yes [provider]  ceFEPIme 2 g in sodium chloride 0.9 % 100 mL Inject 2 g into the vein Every Tuesday,Thursday,and Saturday with dialysis for 11 days. 09/21/18 10/02/18 Yes Hosie Poisson, MD   cinacalcet (SENSIPAR) 30 MG tablet TAKE 1 TABLET BY MOUTH DAILY EVERY EVENING (DO NOT TAKE LESS THAN 12 HOURS PRIOR TO DIALYSIS) 09/19/18  Yes Hosie Poisson, MD  ferric citrate (AURYXIA) 1 GM 210 MG(Fe) tablet Take 420 mg by mouth 3 (three) times daily with meals.   Yes [provider]  metoprolol tartrate (LOPRESSOR) 25 MG tablet Take 25 mg by mouth 2 (two) times daily.   Yes [provider]  oxyCODONE-acetaminophen (PERCOCET/ROXICET) 5-325 MG tablet Take 1 tablet by mouth every 6 (six) hours as needed for severe pain. 09/26/18  Yes Fields, Jessy Oto, MD  pantoprazole (PROTONIX) 40 MG tablet Take 1 tablet (40 mg total) by mouth 2 (two) times daily. 09/19/18  Yes Hosie Poisson, MD   Liver Function Tests Recent Labs  Lab 09/29/18 1046  AST 32  ALT 20  ALKPHOS 58  BILITOT 0.7  PROT 5.5*  ALBUMIN 2.3*   No results for input(s): LIPASE,  AMYLASE in the last 168 hours. CBC Recent Labs  Lab 09/29/18 1046 09/29/18 2050 09/30/18 0208  WBC 14.9*  --  14.5*  NEUTROABS 12.9*  --   --   HGB 5.8* 6.8* 6.6*  HCT 17.2* 20.1* 20.1*  MCV 92.5  --  90.1  PLT 132*  --  502*   Basic Metabolic Panel Recent Labs  Lab 09/26/18 1002 09/29/18 1046 09/30/18 0203  NA 135 135 137  K 4.8 5.2* 4.6  CL  --  97* 100  CO2  --  21* 21*  GLUCOSE 79 77 78  BUN  --  61* 74*  CREATININE  --  7.85* 8.81*  CALCIUM  --  8.9 8.6*   Iron/TIBC/Ferritin/ %Sat    Component Value Date/Time   IRON 45 04/16/2016 1116   TIBC 318 04/16/2016 1116   FERRITIN 894 (H) 04/16/2016 1116   IRONPCTSAT 14 04/16/2016 1116    Vitals:   09/30/18 1218 09/30/18 1226 09/30/18 1243 09/30/18 1255  BP: 127/72 117/72 130/76   Pulse: 98 85 86   Resp: (!) 22  (!) 29 20  Temp: 97.7 F (36.5 C)  97.9 F (36.6 C)   TempSrc: Oral  Oral   SpO2: 100% 100% 100%   Weight:      Height:       Exam Gen alert, thin, chronically ill appearing, no distress No rash, cyanosis or gangrene Sclera anicteric, throat clear   No jvd or bruits Chest clear bilat to bases RRR no MRG Abd soft ntnd no mass or ascites +bs GU defer MS no joint effusions or deformity Ext no LE edema Neuro is alert, Ox 3 , nf,  R TDC/ LUA AVF +bruit some bruising and mild edema    Home meds:  - amlodipine 10/ metoprolol 25 bid - pantoprazole 40 bid/ ferric citrate 420 ac/ cinacalcet 30 qd    [place] TTS  3h 61min  2/2.25 bath   55kg    TDC (replaced 4/28) / L AVF (infiltrated/ needs fistulogram)   Heparin none  - hect 3 ug  - mircera 30 ug q 2wks, last 4/23   - cefepime 2gm TTS at HD   Assessment: 1. GI bleed - per primary/ GI. Getting 2nd unit prbc's this am on HD.  2. ESRD - on HD TTS.  HD today.  Labs ok.  3. HTN - takes norvasc/ metop at home, volume ok under dry wt 4. Cirrhosis / hep C 5. Anemia d/t ABL+CKD - due to esa on 5/7 6. H/o MSSA cath sepsis - getting IV cefepime through 5/4 w HD    Plan: 1. HD today upstairs, no heparin      [facility] 09/30/2018, 2:41 PM

## 2018-09-30 NOTE — Transfer of Care (Signed)
Immediate Anesthesia Transfer of Care Note  Patient: Karen Morrow  Procedure(s) Performed: ESOPHAGOGASTRODUODENOSCOPY (EGD) WITH PROPOFOL (N/A ) BIOPSY  Patient Location: Endoscopy Unit  Anesthesia Type:MAC  Level of Consciousness: awake, alert  and oriented  Airway & Oxygen Therapy: Patient Spontanous Breathing  Post-op Assessment: Report given to RN and Post -op Vital signs reviewed and stable  Post vital signs: Reviewed and stable  Last Vitals:  Vitals Value Taken Time  BP    Temp    Pulse    Resp    SpO2      Last Pain:  Vitals:   09/30/18 1143  TempSrc: Oral  PainSc: 0-No pain         Complications: No apparent anesthesia complications

## 2018-09-30 NOTE — Progress Notes (Signed)
Order placed on 5/1 for 2nd unit of PRBC to be given on 5/2 during dialysis. Dr. Cruzita Lederer placed an additional order for 1 unit of PRBC to be given. Dr. Cruzita Lederer paged and asked if he wanted 1 or 2 units to be given. He stated for 1 unit to be given during dialysis. Dialysis nurse Linna Hoff) notified.   Hiram Comber, RN 09/30/2018 8:01 AM

## 2018-09-30 NOTE — Anesthesia Postprocedure Evaluation (Signed)
Anesthesia Post Note  Patient: Karen Morrow  Procedure(s) Performed: ESOPHAGOGASTRODUODENOSCOPY (EGD) WITH PROPOFOL (N/A ) BIOPSY     Patient location during evaluation: Endoscopy Anesthesia Type: MAC Level of consciousness: awake and alert Pain management: pain level controlled Vital Signs Assessment: post-procedure vital signs reviewed and stable Respiratory status: spontaneous breathing, nonlabored ventilation, respiratory function stable and patient connected to nasal cannula oxygen Cardiovascular status: blood pressure returned to baseline and stable Postop Assessment: no apparent nausea or vomiting Anesthetic complications: no    Last Vitals:  Vitals:   09/30/18 1218 09/30/18 1226  BP: 127/72 117/72  Pulse: 98 85  Resp: (!) 22   Temp: 36.5 C   SpO2: 100% 100%    Last Pain:  Vitals:   09/30/18 1226  TempSrc:   PainSc: 0-No pain                 Rito Lecomte DANIEL

## 2018-09-30 NOTE — Interval H&P Note (Signed)
History and Physical Interval Note:  09/30/2018 11:44 AM  Karen Morrow  has presented today for surgery, with the diagnosis of Melena.  Blood loss anemia.  Gastric ulcer on EGD 2 weeks ago..  The various methods of treatment have been discussed with the patient and family. After consideration of risks, benefits and other options for treatment, the patient has consented to  Procedure(s): ESOPHAGOGASTRODUODENOSCOPY (EGD) WITH PROPOFOL (N/A) as a surgical intervention.  The patient's history has been reviewed, patient examined, no change in status, stable for surgery.  I have reviewed the patient's chart and labs.  Questions were answered to the patient's satisfaction.     Paquita Printy

## 2018-09-30 NOTE — Progress Notes (Signed)
Initial Nutrition Assessment  DOCUMENTATION CODES:  Underweight  INTERVENTION:  Nepro Shake po BID, each supplement provides 425 kcal and 19 grams protein- if unable to tolerate, would recommend trying fat free oral supplement- Boost Breeze or Prostat  Renal MVI  Given pts significant wt loss in past 6 months, would consider changing pt to a Low sodium diet w/ a 1.2 L fluid restriction to promote intake.   NUTRITION DIAGNOSIS:  Inadequate oral intake thought related to recent acute illnesses on top of underlying chronic conditions as evidenced by loss of ~15% bw in <6 months  GOAL:  Patient will meet greater than or equal to 90% of their needs  MONITOR:  PO intake, Labs, I & O's, Supplement acceptance, Weight trends  REASON FOR ASSESSMENT:  Malnutrition Screening Tool    ASSESSMENT:  58 y/o female PMHx ESRD on HD, HF, HTN, GERD,  Hep C.  Recently admitted 4/17-4/21 fopr MSSA baceteremia.Presents w/ report of having bloody bowel movement. Also reports abdominal pain and SOB. In ED, Hgb found to be 5.8. Admitted for GIB.   RD operating remotely d/t covid precautions. Attempted to reach pt via phone x2, but was unsuccessful.   Unfortunately, other than her MST score,  there is no information in chart regarding pts oral intake recently, though would expect her diarrhea and abdominal pain are limiting her ability to eat.   It appears pt has been on HD since mid 2015.   Pts current wt is 115 lbs. Though her wt is difficult to trend, it does appear she has lost roughly 20 lbs in the past 5-6 months or so. She was maintaining a wt in the mid 130s the latter part of 2019. She had a hospitalization in December, after which she was stable in the 120s up until the aforementioned  hospitalization in mid April. She has fluctuated between 115-125 lbs since then. Her loss of 20 lbs in x6 months equates to ~15% bw and meets malnutrition criteria.   At this time, there is no documented meal intake.  Will add oral supplements and an MVI. Given her decline over six months, also feel she is appropriate for reduced diet restrictions.   Labs: WBC: 14.5, Hgb: 5.8->6.6, K: 5.2-> 4.6, Albumin:2.3, Phos: 1.9 (on 4/21) Meds: Sensipar, Auryxia, ppi, carafate, IV abx,   Recent Labs  Lab 09/26/18 1002 09/29/18 1046 09/30/18 0203  NA 135 135 137  K 4.8 5.2* 4.6  CL  --  97* 100  CO2  --  21* 21*  BUN  --  61* 74*  CREATININE  --  7.85* 8.81*  CALCIUM  --  8.9 8.6*  GLUCOSE 79 77 78   NUTRITION - FOCUSED PHYSICAL EXAM: Unable to conduct  Diet Order:   Diet Order            Diet renal with fluid restriction Fluid restriction: 1200 mL Fluid; Room service appropriate? Yes; Fluid consistency: Thin  Diet effective now             EDUCATION NEEDS:  Not appropriate for education at this time  Skin:  Skin Assessment: Reviewed RN Assessment  Last BM:  5/2  Height:  Ht Readings from Last 1 Encounters:  09/29/18 5\' 7"  (1.702 m)   Weight:  Wt Readings from Last 1 Encounters:  09/30/18 52.2 kg   Wt Readings from Last 10 Encounters:  09/30/18 52.2 kg  09/25/18 56.2 kg  09/19/18 55.3 kg  08/11/18 56.7 kg  07/07/18 57 kg  06/30/18 57.2 kg  05/17/18 56.8 kg  05/16/18 56.8 kg  05/05/18 63 kg  04/25/18 61.2 kg   Ideal Body Weight:  61.36 kg  BMI:  Body mass index is 18.02 kg/m.  Estimated Nutritional Needs:  Kcal:  1850-2100 kcals (35-40 kcal/kg bw) Protein:  88-104g Pro (1.8-2g/kg bw) Fluid:  <1.2 Liters  Burtis Junes RD, LDN, CNSC Clinical Nutrition Available Tues-Sat via Pager: [phone] 09/30/2018 6:04 PM

## 2018-09-30 NOTE — Progress Notes (Addendum)
PROGRESS NOTE  Karen Morrow:[id number] DOB: [birth year] DOA: 09/29/2018 PCP: [name], MD   LOS: 0 days   Brief Narrative / Interim history: 58 year old female history of end-stage renal disease on HD (TTS), diastolic CHF, hypertension, GERD, prior history of thrombocytopenia, fatty liver, history of hematemesis with PUD due to NSAIDs, presents to the hospital with breakthrough complaints of large volume bloody bowel movements.  Patient is somewhat of a poor historian, but tells me that there was large amounts of blood, got very scared and came to the hospital.  She was recently hospitalized 4/17-4/21 for MSSA bacteremia and she was placed on cefepime (she had thrombocytopenia on Ancef), and her antibiotics are to be discontinued on 5/4.  Subjective: Seen in dialysis, no specific complaints currently, no chest pain, no abdominal pain, no nausea or vomiting.  She is upset that she cannot eat and drink anything  Assessment & Plan: Principal Problem:   Gastrointestinal bleeding Active Problems:   Abdominal pain   Gastric ulcer   Hyperkalemia   MSSA bacteremia   Chronic hepatitis C (HCC)   Principal Problem Acute blood loss anemia due to GI bleed -Patient reported bloody bowel movements, which is never happened to her before and tells me it is all completely new.  She has history of hematemesis and had an EGD most recently performed by Dr. Benson Norway -Hemoglobin on admission was 5.8, she received 2 units of packed red blood cells, hemoglobin this morning is 6.6.  Will transfuse 1 additional unit -Gastroenterology consulted, plans are in place for an EGD today.  Continue Protonix -Despite 2 units of packed red blood cells remains symptomatically anemic, requiring additional transfusions.  She requires to continue to need inpatient hospital service  Active Problems End-stage renal disease -Nephrology consulted, continue her regular hemodialysis  PUD -History of esophagitis,  gastric ulcer, diverticulum of the duodenum: As seen on last EGD performed by Dr. Benson Norway on 09/17/2018.  MSSA bacteremia -Currently on cefepime with HD per ID, to complete course on 5/4  Essential hypertension -Continue amlodipine and metoprolol as tolerated  Degenerative disc disease with spinal stenosis -Incidental finding seen on CT scan noting severe degenerative disc disease of L5-S1 with moderate to severe bilateral neural foraminal stenosis.  Chronic Hepatitis C -ID/GI as an outpatient   Chronic diastolic CHF -Volume status per dialysis  Scheduled Meds: . sodium chloride   Intravenous Once  . amLODipine  10 mg Oral QHS  . Chlorhexidine Gluconate Cloth  6 each Topical Q0600  . cinacalcet  30 mg Oral Q supper  . ferric citrate  420 mg Oral TID WC  . metoprolol tartrate  25 mg Oral BID  . pantoprazole (PROTONIX) IV  40 mg Intravenous Q12H   Continuous Infusions: . sodium chloride    . ceFEPIme (MAXIPIME) 2 GM IVPB (Mini-Bag Plus) 2 g (09/30/18 1028)   PRN Meds:.acetaminophen **OR** acetaminophen, albuterol, heparin, ondansetron **OR** ondansetron (ZOFRAN) IV  DVT prophylaxis: SCDs Code Status: Full code Family Communication: no family at bedside  Disposition Plan: home when ready  Consultants:   GI  Procedures:   EGD - pending   Antimicrobials:  Cefepime    Objective: Vitals:   09/30/18 0900 09/30/18 0930 09/30/18 1000 09/30/18 1030  BP: 118/72 114/75 98/69 116/76  Pulse: 85 (!) 105 85 91  Resp:      Temp:      TempSrc:      SpO2:      Weight:      Height:  Intake/Output Summary (Last 24 hours) at 09/30/2018 1037 Last data filed at 09/30/2018 0809 Gross per 24 hour  Intake 958 ml  Output 1 ml  Net 957 ml   Filed Weights   09/29/18 1832 09/30/18 0720  Weight: 59.5 kg 55.2 kg    Examination:  Constitutional: NAD Eyes: PERRL, lids and conjunctivae normal ENMT: Mucous membranes are moist.  Respiratory: clear to auscultation bilaterally,  no wheezing, no crackles. Normal respiratory effort.  Cardiovascular: Regular rate and rhythm, no murmurs / rubs / gallops. No LE edema Abdomen: no tenderness. Bowel sounds positive.  Musculoskeletal: no clubbing / cyanosis.  Skin: no rashes Neurologic: no focal deficits Psychiatric: Normal judgment and insight. Alert and oriented x 3. Normal mood.    Data Reviewed: I have independently reviewed following labs and imaging studies   CBC: Recent Labs  Lab 09/26/18 1002 09/29/18 1046 09/29/18 2050 09/30/18 0208  WBC  --  14.9*  --  14.5*  NEUTROABS  --  12.9*  --   --   HGB 10.2* 5.8* 6.8* 6.6*  HCT 30.0* 17.2* 20.1* 20.1*  MCV  --  92.5  --  90.1  PLT  --  132*  --  093*   Basic Metabolic Panel: Recent Labs  Lab 09/26/18 1002 09/29/18 1046 09/30/18 0203  NA 135 135 137  K 4.8 5.2* 4.6  CL  --  97* 100  CO2  --  21* 21*  GLUCOSE 79 77 78  BUN  --  61* 74*  CREATININE  --  7.85* 8.81*  CALCIUM  --  8.9 8.6*   GFR: Estimated Creatinine Clearance: 6.1 mL/min (A) (by C-G formula based on SCr of 8.81 mg/dL (H)). Liver Function Tests: Recent Labs  Lab 09/29/18 1046  AST 32  ALT 20  ALKPHOS 58  BILITOT 0.7  PROT 5.5*  ALBUMIN 2.3*   No results for input(s): LIPASE, AMYLASE in the last 168 hours. No results for input(s): AMMONIA in the last 168 hours. Coagulation Profile: No results for input(s): INR, PROTIME in the last 168 hours. Cardiac Enzymes: No results for input(s): CKTOTAL, CKMB, CKMBINDEX, TROPONINI in the last 168 hours. BNP (last 3 results) No results for input(s): PROBNP in the last 8760 hours. HbA1C: No results for input(s): HGBA1C in the last 72 hours. CBG: No results for input(s): GLUCAP in the last 168 hours. Lipid Profile: No results for input(s): CHOL, HDL, LDLCALC, TRIG, CHOLHDL, LDLDIRECT in the last 72 hours. Thyroid Function Tests: No results for input(s): TSH, T4TOTAL, FREET4, T3FREE, THYROIDAB in the last 72 hours. Anemia Panel: No  results for input(s): VITAMINB12, FOLATE, FERRITIN, TIBC, IRON, RETICCTPCT in the last 72 hours. Urine analysis:    Component Value Date/Time   COLORURINE YELLOW 11/26/2013 1819   APPEARANCEUR TURBID (A) 11/26/2013 1819   LABSPEC 1.021 11/26/2013 1819   PHURINE 5.5 11/26/2013 1819   GLUCOSEU NEGATIVE 11/26/2013 1819   HGBUR SMALL (A) 11/26/2013 1819   BILIRUBINUR NEGATIVE 11/26/2013 1819   KETONESUR NEGATIVE 11/26/2013 1819   PROTEINUR >300 (A) 11/26/2013 1819   UROBILINOGEN 0.2 11/26/2013 1819   NITRITE POSITIVE (A) 11/26/2013 1819   LEUKOCYTESUR MODERATE (A) 11/26/2013 1819   Sepsis Labs: Invalid input(s): PROCALCITONIN, LACTICIDVEN  No results found for this or any previous visit (from the past 240 hour(s)).    Radiology Studies: Ct Abdomen Pelvis Wo Contrast  Result Date: 09/29/2018 CLINICAL DATA:  Acute abdominal pain.  Bloody stool. EXAM: CT ABDOMEN AND PELVIS WITHOUT CONTRAST TECHNIQUE: Multidetector CT imaging  of the abdomen and pelvis was performed following the standard protocol without IV contrast. COMPARISON:  None. FINDINGS: LOWER CHEST: Small right basilar effusion.  Mild cardiomegaly. HEPATOBILIARY: The hepatic contours and density are normal. There is no intra- or extrahepatic biliary dilatation. Gallbladder absent or nondistended. PANCREAS: The pancreatic parenchymal contours are normal and there is no ductal dilatation. There is no peripancreatic fluid collection. SPLEEN: Normal. ADRENALS/URINARY TRACT: --Adrenal glands: Normal. --Right kidney/ureter: Generalized atrophy with multiple calcifications. --Left kidney/ureter: Generalized atrophy with multiple calcifications. --Urinary bladder: Normal for degree of distention STOMACH/BOWEL: --Stomach/Duodenum: There is no hiatal hernia or other gastric abnormality. The duodenal course and caliber are normal. --Small bowel: No dilatation or inflammation. --Colon: No focal abnormality. --Appendix: Normal. VASCULAR/LYMPHATIC: There  is aortic atherosclerosis without hemodynamically significant stenosis. No abdominal or pelvic lymphadenopathy. REPRODUCTIVE: Status post hysterectomy. No adnexal mass. MUSCULOSKELETAL. Severe L5-S1 disc space narrowing with left-greater-than-right neural foraminal stenosis. OTHER: None. IMPRESSION: 1. No acute abnormality of the abdomen or pelvis. 2. Small right pleural effusion and mild cardiomegaly. 3. Severe L5-S1 degenerative disc disease with moderate-to-severe bilateral neural foraminal stenosis. 4. Bilateral renal atrophy. Electronically Signed   By: Ulyses Jarred M.D.   On: 09/29/2018 14:33     Marzetta Board, MD, PhD [facility]  Contact via  [email or web address]  [place] P: [phone]  F: [phone]

## 2018-09-30 NOTE — Progress Notes (Signed)
Called by bedside RN to go over patient plan of care.  Pt here with GIB. Hgb at admission 5.8.  Pt received 1 unit PRBCs with orders to transfuse 2nd unit PRBCs during HD.  Pt hgb increased to 6.8 following 1st unit PRBCs. Pt then had a bloody BM. VS remain stable and pt alert and oriented. AM CBC already ordered and confirmed that lab on way to get blood.  RRT agrees with plan of care for pt.  Will await AM hgb and plan for HD RN to transfuse 2nd unit PRBCs during HD.  0300-Update: AM hgb-6.6

## 2018-09-30 NOTE — Procedures (Signed)
   I was present at this dialysis session, have reviewed the session itself and made  appropriate changes Kelly Splinter MD [place] pager [phone]   09/30/2018, 3:12 PM

## 2018-10-01 DIAGNOSIS — K2971 Gastritis, unspecified, with bleeding: Secondary | ICD-10-CM

## 2018-10-01 LAB — CBC
HCT: 23.2 % — ABNORMAL LOW (ref 36.0–46.0)
Hemoglobin: 7.9 g/dL — ABNORMAL LOW (ref 12.0–15.0)
MCH: 30.6 pg (ref 26.0–34.0)
MCHC: 34.1 g/dL (ref 30.0–36.0)
MCV: 89.9 fL (ref 80.0–100.0)
Platelets: 82 10*3/uL — ABNORMAL LOW (ref 150–400)
RBC: 2.58 MIL/uL — ABNORMAL LOW (ref 3.87–5.11)
RDW: 19.5 % — ABNORMAL HIGH (ref 11.5–15.5)
WBC: 8.6 10*3/uL (ref 4.0–10.5)
nRBC: 0.9 % — ABNORMAL HIGH (ref 0.0–0.2)

## 2018-10-01 LAB — TYPE AND SCREEN
ABO/RH(D): O POS
Antibody Screen: NEGATIVE
Unit division: 0
Unit division: 0

## 2018-10-01 LAB — BPAM RBC
Blood Product Expiration Date: 202005162359
Blood Product Expiration Date: 202005162359
ISSUE DATE / TIME: 202005011521
ISSUE DATE / TIME: 202005020749
Unit Type and Rh: 5100
Unit Type and Rh: 5100

## 2018-10-01 LAB — BASIC METABOLIC PANEL
Anion gap: 13 (ref 5–15)
BUN: 21 mg/dL — ABNORMAL HIGH (ref 6–20)
CO2: 25 mmol/L (ref 22–32)
Calcium: 8 mg/dL — ABNORMAL LOW (ref 8.9–10.3)
Chloride: 97 mmol/L — ABNORMAL LOW (ref 98–111)
Creatinine, Ser: 4.51 mg/dL — ABNORMAL HIGH (ref 0.44–1.00)
GFR calc Af Amer: 12 mL/min — ABNORMAL LOW (ref >60)
GFR calc non Af Amer: 10 mL/min — ABNORMAL LOW (ref >60)
Glucose, Bld: 74 mg/dL (ref 70–99)
Potassium: 3 mmol/L — ABNORMAL LOW (ref 3.5–5.1)
Sodium: 135 mmol/L (ref 135–145)

## 2018-10-01 MED ORDER — NEPRO/CARBSTEADY PO LIQD: 237.0000 mL | Freq: Two times a day (BID) | ORAL | 0 refills | Status: DC

## 2018-10-01 MED ORDER — SUCRALFATE 1 G PO TABS: 1.0000 g | ORAL_TABLET | Freq: Three times a day (TID) | ORAL | 0 refills | Status: DC

## 2018-10-01 MED ORDER — PANTOPRAZOLE SODIUM 40 MG PO TBEC: 40.0000 mg | DELAYED_RELEASE_TABLET | Freq: Two times a day (BID) | ORAL | 0 refills | Status: DC

## 2018-10-01 NOTE — Progress Notes (Addendum)
Subjective:  Feeling better / hd yest on schedule. Noted GI yesterday= EGD ="remarkable for esophagitis, clean based, non-bleeding gastric ulcer and gastric inflammation / friability  (biopsies for H.Pylori pending)"  Objective Vital signs in last 24 hours: Vitals:   09/30/18 2304 10/01/18 0120 10/01/18 0641 10/01/18 0845  BP:  114/73 111/80 96/68  Pulse:   75   Resp:  18 (!) 23 15  Temp: 97.7 F (36.5 C) 98.3 F (36.8 C) 97.6 F (36.4 C) 97.9 F (36.6 C)  TempSrc: Oral Oral Oral Oral  SpO2:  96%  100%  Weight:      Height:       Weight change: -4.3 kg  Physical Exam: General=alert, thin, chronically ill appearing, no distress Lungs=Chest clear bilat to bases Heart=RRR no MRG Abd =soft ntnd no mass or ascites +bs Extrem=  no LE edema Dialysis Access=R TDC/ LUA AVF +bruit some bruising and mild edema  Home meds:  - amlodipine 10/ metoprolol 25 bid - pantoprazole 40 bid/ ferric citrate 420 ac/ cinacalcet 30 qd    [facility]  3h 66min  2/2.25 bath   55kg    TDC (replaced 4/28) / L AVF (infiltrated/ needs fistulogram)   Heparin none  - hect 3 ug  - mircera 30 ug q 2wks, last 4/23   - cefepime 2gm TTS at HD  Problem/Plan:  1. GI bleed -  GI yest With EGD=  non-bleeding gastric ulcer and gastric inflammation / friability =biopsies for H.Pylori pending Rx per admit/ gi per primary/ GI.  / 2 u prbcs  hgb 8.5 this am/  NO HEP HD  2. ESRD - on HD TTS.  HD today.  K 3.0 <4.6<5.2 / Stable LU Arm avf swelling (op fistulogram vs inpt if here more days) 3. HTN - takes norvasc/ metop at home, volume ok under dry wt 4. Cirrhosis / hep C Anemia d/t ABL+CKD - due to esa on 5/7 Hgb 5.8>6.6>8.5 this am .as above (2 u total PRBCS ) last OP TFS 42 % =(09/21/18) 5. H/o MSSA cath sepsis - getting IV cefepime through 5/4 w HD 6. MBD = Hect 3 on hd , phos binders = Auryxia 2 with meals   Ernest Haber, PA-C [facility] Beeper [phone] 10/01/2018,10:30 AM  LOS: 1 day    Pt seen, examined and agree w A/P as above.  [facility] 10/01/2018, 12:42 PM    Labs: Basic Metabolic Panel: Recent Labs  Lab 09/29/18 1046 09/30/18 0203 10/01/18 0244  NA 135 137 135  K 5.2* 4.6 3.0*  CL 97* 100 97*  CO2 21* 21* 25  GLUCOSE 77 78 74  BUN 61* 74* 21*  CREATININE 7.85* 8.81* 4.51*  CALCIUM 8.9 8.6* 8.0*   Liver Function Tests: Recent Labs  Lab 09/29/18 1046  AST 32  ALT 20  ALKPHOS 58  BILITOT 0.7  PROT 5.5*  ALBUMIN 2.3*   No results for input(s): LIPASE, AMYLASE in the last 168 hours. No results for input(s): AMMONIA in the last 168 hours. CBC: Recent Labs  Lab 09/29/18 1046  09/30/18 0208 09/30/18 1816 10/01/18 0244  WBC 14.9*  --  14.5* 11.6* 8.6  NEUTROABS 12.9*  --   --   --   --   HGB 5.8*   < > 6.6* 8.5* 7.9*  HCT 17.2*   < > 20.1* 24.4* 23.2*  MCV 92.5  --  90.1 88.4 89.9  PLT 132*  --  124*  96* 82*   < > = values in this interval not displayed.   Cardiac Enzymes: No results for input(s): CKTOTAL, CKMB, CKMBINDEX, TROPONINI in the last 168 hours. CBG: No results for input(s): GLUCAP in the last 168 hours.  Studies/Results: Ct Abdomen Pelvis Wo Contrast  Result Date: 09/29/2018 CLINICAL DATA:  Acute abdominal pain.  Bloody stool. EXAM: CT ABDOMEN AND PELVIS WITHOUT CONTRAST TECHNIQUE: Multidetector CT imaging of the abdomen and pelvis was performed following the standard protocol without IV contrast. COMPARISON:  None. FINDINGS: LOWER CHEST: Small right basilar effusion.  Mild cardiomegaly. HEPATOBILIARY: The hepatic contours and density are normal. There is no intra- or extrahepatic biliary dilatation. Gallbladder absent or nondistended. PANCREAS: The pancreatic parenchymal contours are normal and there is no ductal dilatation. There is no peripancreatic fluid collection. SPLEEN: Normal. ADRENALS/URINARY TRACT: --Adrenal glands: Normal. --Right kidney/ureter: Generalized atrophy with multiple calcifications.  --Left kidney/ureter: Generalized atrophy with multiple calcifications. --Urinary bladder: Normal for degree of distention STOMACH/BOWEL: --Stomach/Duodenum: There is no hiatal hernia or other gastric abnormality. The duodenal course and caliber are normal. --Small bowel: No dilatation or inflammation. --Colon: No focal abnormality. --Appendix: Normal. VASCULAR/LYMPHATIC: There is aortic atherosclerosis without hemodynamically significant stenosis. No abdominal or pelvic lymphadenopathy. REPRODUCTIVE: Status post hysterectomy. No adnexal mass. MUSCULOSKELETAL. Severe L5-S1 disc space narrowing with left-greater-than-right neural foraminal stenosis. OTHER: None. IMPRESSION: 1. No acute abnormality of the abdomen or pelvis. 2. Small right pleural effusion and mild cardiomegaly. 3. Severe L5-S1 degenerative disc disease with moderate-to-severe bilateral neural foraminal stenosis. 4. Bilateral renal atrophy. Electronically Signed   By: Ulyses Jarred M.D.   On: 09/29/2018 14:33   Medications: . sodium chloride    . ceFEPIme (MAXIPIME) 2 GM IVPB (Mini-Bag Plus) Stopped (09/30/18 1137)   . sodium chloride   Intravenous Once  . amLODipine  10 mg Oral QHS  . Chlorhexidine Gluconate Cloth  6 each Topical Q0600  . cinacalcet  30 mg Oral Q supper  . feeding supplement (NEPRO CARB STEADY)  237 mL Oral BID BM  . ferric citrate  420 mg Oral TID WC  . metoprolol tartrate  25 mg Oral BID  . multivitamin  1 tablet Oral QHS  . pantoprazole (PROTONIX) IV  40 mg Intravenous Q12H  . sucralfate  1 g Oral TID WC & HS     Feelas better m HD ywrs

## 2018-10-01 NOTE — Discharge Summary (Signed)
Physician Discharge Summary  BRENETTA PENNY OIT:[id number] DOB: November 21, 1960 DOA: 09/29/2018  PCP: [name], MD  Admit date: 09/29/2018 Discharge date: 10/01/2018  Admitted From: Home Disposition: Home  Recommendations for Outpatient Follow-up:  1. Follow up with PCP in 1-2 weeks 2. Please obtain CBC/BMP/Mag at follow up 3. Please follow up on the following pending results: H. pylori testing  Home Health: None Equipment/Devices: None  Discharge Condition: Stable CODE STATUS: Full code   Hospital Course: 58 year old female history of ESRD on HD (TTS), diastolic CHF, hypertension, GERD, prior history of thrombocytopenia, fatty liver, history of hematemesis with PUD due to NSAIDs, presents to the hospital with breakthrough complaints of large volume bloody bowel movements. She got very scared and came to the hospital.  She was recently hospitalized 4/17-4/21 for MSSA bacteremia and she was placed on cefepime (she had thrombocytopenia on Ancef), and her antibiotics are to be discontinued on 5/4.  On admission, hemoglobin was 5.8.  She received 3 units of blood with appropriate response.  He had EGD on 09/30/2018 that showed LA grade C reflux esophagitis, small hiatal hernia, nonbleeding gastric ulcer with a clean ulcer base, erosive nodular gastritis (biopsied), nonbleeding duodenal diverticulum and normal second portion of duodenum.  Hemoglobin remained stable.  She tolerated diet.  Discharged home on Protonix twice daily and Carafate ACHS for one month per GI recommendation.  See individual problem list below for more.  Discharge Diagnoses:  Principal Problem:   Gastrointestinal bleeding Active Problems:   Abdominal pain   Gastric ulcer   Hyperkalemia   MSSA bacteremia   Chronic hepatitis C (HCC)   GI bleed  Acute blood loss anemia due to GI bleed/reflux esophagitis/erosive gastritis/gastric ulcer/PUD -No further bleeding. -EGD on 09/30/2018.  Result as above. -Hemoglobin  5.8--2 units--at 6.6--1 unit--8.5 (exaggerated)--7.9 (equilibrium). -GI signed off and recommended: -Protonix 40 mg twice daily -Carafate 1 g 4 times daily for 1 months -Follow-up with PCP in 1 to 2 weeks -Check CBC at follow-up -GI follow-up as needed -Follow H. pylori results  MSSA bacteremia -Continue cefepime with HD per ID, to complete course on 5/4  ESRD - on HD TTS. HD 5/2.   Stable LU Arm AVF swelling-outpatient fistulogram per nephro  Other chronic medical conditions stable -Continue home medications  Discharge Instructions  Discharge Instructions    Call MD for:  difficulty breathing, headache or visual disturbances   Complete by:  As directed    Call MD for:  persistant dizziness or light-headedness   Complete by:  As directed    Call MD for:  persistant nausea and vomiting   Complete by:  As directed    Call MD for:  severe uncontrolled pain   Complete by:  As directed    Call MD for:  temperature >100.4   Complete by:  As directed    Diet - low sodium heart healthy   Complete by:  As directed    Discharge instructions   Complete by:  As directed    It has been a pleasure taking care of you! You were admitted with bleeding from your tummy.  You received blood transfusion.  After blood transfusion, your hemoglobin is stable.  We are discharging you on Protonix and Carafate which you need to continue taking.  There could be some other changes to your home medication.  Review medication list and the directions before you take them.  Please follow-up with your primary care doctor in 1 to 2 weeks.  The  gastroenterologist will call you with the results of your biopsy.  You may follow-up with them as needed in 2 to 3 weeks.  Once you are discharged, your primary care physician will handle any further medical issues. Please note that NO REFILLS for any discharge medications will be authorized once you are discharged, as it is imperative that you return to your primary  care physician (or establish a relationship with a primary care physician if you do not have one) for your aftercare needs so that they can reassess your need for medications and monitor your lab values. Take care,   Increase activity slowly   Complete by:  As directed      Allergies as of 10/01/2018      Reactions   Cefazolin Other (See Comments)   Severe thrombocytopenia (has tolerated zosyn in the past) ID aware Tolerated in October 2019      Medication List    TAKE these medications   acetaminophen 500 MG tablet Commonly known as:  TYLENOL Take 500-1,000 mg by mouth every 6 (six) hours as needed for headache (pain).   albuterol 108 (90 Base) MCG/ACT inhaler Commonly known as:  VENTOLIN HFA TAKE 2 PUFFS BY MOUTH EVERY 6 HOURS AS NEEDED FOR WHEEZE OR SHORTNESS OF BREATH What changed:  See the new instructions.   albuterol (2.5 MG/3ML) 0.083% nebulizer solution Commonly known as:  PROVENTIL Take 3 mLs (2.5 mg total) by nebulization every 6 (six) hours as needed for wheezing or shortness of breath. MUST MAKE APPT FOR FURTHER REFILLS What changed:  Another medication with the same name was changed. Make sure you understand how and when to take each.   amLODipine 10 MG tablet Commonly known as:  NORVASC Take 1 tablet (10 mg total) by mouth daily. What changed:  when to take this   Auryxia 1 GM 210 MG(Fe) tablet Generic drug:  ferric citrate Take 420 mg by mouth 3 (three) times daily with meals.   b complex-vitamin c-folic acid 0.8 MG Tabs tablet Take 1 tablet by mouth every evening.   ceFEPIme 2 g in sodium chloride 0.9 % 100 mL Inject 2 g into the vein Every Tuesday,Thursday,and Saturday with dialysis for 11 days.   cinacalcet 30 MG tablet Commonly known as:  SENSIPAR TAKE 1 TABLET BY MOUTH DAILY EVERY EVENING (DO NOT TAKE LESS THAN 12 HOURS PRIOR TO DIALYSIS)   feeding supplement (NEPRO CARB STEADY) Liqd Take 237 mLs by mouth 2 (two) times daily between meals.     feeding supplement (PRO-STAT SUGAR FREE 64) Liqd Take 30 mLs by mouth 2 (two) times daily.   metoprolol tartrate 25 MG tablet Commonly known as:  LOPRESSOR Take 25 mg by mouth 2 (two) times daily.   oxyCODONE-acetaminophen 5-325 MG tablet Commonly known as:  PERCOCET/ROXICET Take 1 tablet by mouth every 6 (six) hours as needed for severe pain.   pantoprazole 40 MG tablet Commonly known as:  PROTONIX Take 1 tablet (40 mg total) by mouth 2 (two) times daily.   sucralfate 1 g tablet Commonly known as:  CARAFATE Take 1 tablet (1 g total) by mouth 4 (four) times daily -  with meals and at bedtime.      Follow-up Information    [name], MD. Schedule an appointment as soon as possible for a visit in 1 week(s).   Specialty:  Internal Medicine Contact information: [address] [place] [address] [phone]  Consultations:  Gastroenterology  Nephrology  Procedures/Studies:  2D Echo: None obtained this admission  EGD on 09/30/2018 revealed LA grade C reflux esophagitis, small hiatal hernia, nonbleeding gastric ulcer with a clean ulcer base, erosive nodular gastritis (biopsied), nonbleeding duodenal diverticulum and normal second portion of duodenum.   Ct Abdomen Pelvis Wo Contrast  Result Date: 09/29/2018 CLINICAL DATA:  Acute abdominal pain.  Bloody stool. EXAM: CT ABDOMEN AND PELVIS WITHOUT CONTRAST TECHNIQUE: Multidetector CT imaging of the abdomen and pelvis was performed following the standard protocol without IV contrast. COMPARISON:  None. FINDINGS: LOWER CHEST: Small right basilar effusion.  Mild cardiomegaly. HEPATOBILIARY: The hepatic contours and density are normal. There is no intra- or extrahepatic biliary dilatation. Gallbladder absent or nondistended. PANCREAS: The pancreatic parenchymal contours are normal and there is no ductal dilatation. There is no peripancreatic fluid collection. SPLEEN: Normal. ADRENALS/URINARY TRACT: --Adrenal  glands: Normal. --Right kidney/ureter: Generalized atrophy with multiple calcifications. --Left kidney/ureter: Generalized atrophy with multiple calcifications. --Urinary bladder: Normal for degree of distention STOMACH/BOWEL: --Stomach/Duodenum: There is no hiatal hernia or other gastric abnormality. The duodenal course and caliber are normal. --Small bowel: No dilatation or inflammation. --Colon: No focal abnormality. --Appendix: Normal. VASCULAR/LYMPHATIC: There is aortic atherosclerosis without hemodynamically significant stenosis. No abdominal or pelvic lymphadenopathy. REPRODUCTIVE: Status post hysterectomy. No adnexal mass. MUSCULOSKELETAL. Severe L5-S1 disc space narrowing with left-greater-than-right neural foraminal stenosis. OTHER: None. IMPRESSION: 1. No acute abnormality of the abdomen or pelvis. 2. Small right pleural effusion and mild cardiomegaly. 3. Severe L5-S1 degenerative disc disease with moderate-to-severe bilateral neural foraminal stenosis. 4. Bilateral renal atrophy. Electronically Signed   By: Ulyses Jarred M.D.   On: 09/29/2018 14:33   Ir Removal Tun Cv Cath W/o Fl  Result Date: 09/17/2018 INDICATION: Sepsis. End-stage renal failure on hemodialysis. Request for removal of tunneled hemodialysis catheter. Per patient was initially placed 4 years ago and she does not know which facility. EXAM: REMOVAL OF TUNNELED HEMODIALYSIS CATHETER MEDICATIONS: 1% lidocaine 15 mL ANESTHESIA/SEDATION: No sedation medication given. COMPLICATIONS: None immediate. PROCEDURE: Informed written consent was obtained from the patient following an explanation of the procedure, risks, benefits and alternatives to treatment. A time out was performed prior to the initiation of the procedure. Maximal barrier sterile technique was utilized including mask, sterile gloves, sterile drape, hand hygiene, and ChloraPrep. 1% lidocaine was injected along the tract and around the cuff. Attempt was made to remove the tunneled  catheter with gentle traction and dissection however the catheter was extremely scarred down. The cuff was palpated about 3 inches above the exit site of the catheter. Using a 11. Blade a small incision was made over the cuff. Using scissors and hemostatic cuff was dissected out the catheter was removed. Next 2 interrupted 3-0 Ethilon sutures were used to approximate the skin at the incision site and 1 interrupted suture was used to approximate the incision made just above the exit site taking care to make sure the actual catheter exit site remained open to allow for drainage and prevent abscess formation. Dry gauze and a Tegaderm was placed over both sites. IMPRESSION: Successful removal of tunneled dialysis catheter. Read by: Gareth Eagle, PA-C Electronically Signed   By: Jacqulynn Cadet M.D.   On: 09/17/2018 12:54   Dg Chest Port 1 View  Result Date: 09/15/2018 CLINICAL DATA:  Shortness of breath fever EXAM: PORTABLE CHEST 1 VIEW COMPARISON:  05/13/2018 FINDINGS: Cardiac shadow is mildly enlarged. Dialysis catheter is again seen. The lungs are well aerated without focal infiltrate or  sizable effusion. No bony abnormality is noted. IMPRESSION: No acute abnormality noted. Electronically Signed   By: Inez Catalina M.D.   On: 09/15/2018 21:54   Dg Fluoro Guide Cv Line-no Report  Result Date: 09/26/2018 Fluoroscopy was utilized by the requesting physician.  No radiographic interpretation.      Subjective: No major events overnight of this morning.  No complaint this morning.  Denies chest pain, dyspnea, palpitation, dizziness, nausea, vomiting, abdominal pain or blood in the stool.  She says she would like to go home and be with her husband today.   Discharge Exam: Vitals:   10/01/18 0641 10/01/18 0845  BP: 111/80 96/68  Pulse: 75   Resp: (!) 23 15  Temp: 97.6 F (36.4 C) 97.9 F (36.6 C)  SpO2:  100%    GENERAL: No acute distress.  Appears well.  HEENT: MMM.  Vision and hearing grossly  intact.  NECK: Supple.  No JVD.  LUNGS:  No IWOB. Good air movement bilaterally. HEART:  RRR.  3/6 SEM over RUSB ABD: Bowel sounds present. Soft. Non tender.  MSK/EXT:  Moves all extremities. No apparent deformity. No edema bilaterally. SKIN: no apparent skin lesion or wound NEURO: Awake, alert and oriented appropriately.  No gross deficit.  PSYCH: Calm. Normal affect.   HD cath over right chest  The results of significant diagnostics from this hospitalization (including imaging, microbiology, ancillary and laboratory) are listed below for reference.     Microbiology: No results found for this or any previous visit (from the past 240 hour(s)).   Labs: BNP (last 3 results) Recent Labs    05/13/18 0539 09/15/18 2126  BNP >4,500.0* 3,559.7*   Basic Metabolic Panel: Recent Labs  Lab 09/26/18 1002 09/29/18 1046 09/30/18 0203 10/01/18 0244  NA 135 135 137 135  K 4.8 5.2* 4.6 3.0*  CL  --  97* 100 97*  CO2  --  21* 21* 25  GLUCOSE 79 77 78 74  BUN  --  61* 74* 21*  CREATININE  --  7.85* 8.81* 4.51*  CALCIUM  --  8.9 8.6* 8.0*   Liver Function Tests: Recent Labs  Lab 09/29/18 1046  AST 32  ALT 20  ALKPHOS 58  BILITOT 0.7  PROT 5.5*  ALBUMIN 2.3*   No results for input(s): LIPASE, AMYLASE in the last 168 hours. No results for input(s): AMMONIA in the last 168 hours. CBC: Recent Labs  Lab 09/29/18 1046 09/29/18 2050 09/30/18 0208 09/30/18 1816 10/01/18 0244  WBC 14.9*  --  14.5* 11.6* 8.6  NEUTROABS 12.9*  --   --   --   --   HGB 5.8* 6.8* 6.6* 8.5* 7.9*  HCT 17.2* 20.1* 20.1* 24.4* 23.2*  MCV 92.5  --  90.1 88.4 89.9  PLT 132*  --  124* 96* 82*   Cardiac Enzymes: No results for input(s): CKTOTAL, CKMB, CKMBINDEX, TROPONINI in the last 168 hours. BNP: Invalid input(s): POCBNP CBG: No results for input(s): GLUCAP in the last 168 hours. D-Dimer No results for input(s): DDIMER in the last 72 hours. Hgb A1c No results for input(s): HGBA1C in the last  72 hours. Lipid Profile No results for input(s): CHOL, HDL, LDLCALC, TRIG, CHOLHDL, LDLDIRECT in the last 72 hours. Thyroid function studies No results for input(s): TSH, T4TOTAL, T3FREE, THYROIDAB in the last 72 hours.  Invalid input(s): FREET3 Anemia work up No results for input(s): VITAMINB12, FOLATE, FERRITIN, TIBC, IRON, RETICCTPCT in the last 72 hours. Urinalysis    Component  Value Date/Time   COLORURINE YELLOW 11/26/2013 1819   APPEARANCEUR TURBID (A) 11/26/2013 1819   LABSPEC 1.021 11/26/2013 1819   PHURINE 5.5 11/26/2013 1819   GLUCOSEU NEGATIVE 11/26/2013 1819   HGBUR SMALL (A) 11/26/2013 1819   BILIRUBINUR NEGATIVE 11/26/2013 1819   KETONESUR NEGATIVE 11/26/2013 1819   PROTEINUR >300 (A) 11/26/2013 1819   UROBILINOGEN 0.2 11/26/2013 1819   NITRITE POSITIVE (A) 11/26/2013 1819   LEUKOCYTESUR MODERATE (A) 11/26/2013 1819   Sepsis Labs Invalid input(s): PROCALCITONIN,  WBC,  LACTICIDVEN   Time coordinating discharge: 35 minutes  SIGNED:  Mercy Riding, MD  [facility] 10/01/2018, 10:57 AM Pager [phone]  If 7PM-7AM, please contact night-coverage [email or web address] Password [email or web address]

## 2018-10-01 NOTE — Discharge Planning (Signed)
Nsg Discharge Note  Admit Date:  09/29/2018 Discharge date: 10/01/2018   Karen Morrow to be D/C'd Home per MD order.    Discharge Medication: Allergies as of 10/01/2018      Reactions   Cefazolin Other (See Comments)   Severe thrombocytopenia (has tolerated zosyn in the past) ID aware Tolerated in October 2019      Medication List    TAKE these medications   acetaminophen 500 MG tablet Commonly known as:  TYLENOL Take 500-1,000 mg by mouth every 6 (six) hours as needed for headache (pain).   albuterol 108 (90 Base) MCG/ACT inhaler Commonly known as:  VENTOLIN HFA TAKE 2 PUFFS BY MOUTH EVERY 6 HOURS AS NEEDED FOR WHEEZE OR SHORTNESS OF BREATH What changed:  See the new instructions.   albuterol (2.5 MG/3ML) 0.083% nebulizer solution Commonly known as:  PROVENTIL Take 3 mLs (2.5 mg total) by nebulization every 6 (six) hours as needed for wheezing or shortness of breath. MUST MAKE APPT FOR FURTHER REFILLS What changed:  Another medication with the same name was changed. Make sure you understand how and when to take each.   amLODipine 10 MG tablet Commonly known as:  NORVASC Take 1 tablet (10 mg total) by mouth daily. What changed:  when to take this   Auryxia 1 GM 210 MG(Fe) tablet Generic drug:  ferric citrate Take 420 mg by mouth 3 (three) times daily with meals.   b complex-vitamin c-folic acid 0.8 MG Tabs tablet Take 1 tablet by mouth every evening.   ceFEPIme 2 g in sodium chloride 0.9 % 100 mL Inject 2 g into the vein Every Tuesday,Thursday,and Saturday with dialysis for 11 days.   cinacalcet 30 MG tablet Commonly known as:  SENSIPAR TAKE 1 TABLET BY MOUTH DAILY EVERY EVENING (DO NOT TAKE LESS THAN 12 HOURS PRIOR TO DIALYSIS)   feeding supplement (NEPRO CARB STEADY) Liqd Take 237 mLs by mouth 2 (two) times daily between meals.   feeding supplement (PRO-STAT SUGAR FREE 64) Liqd Take 30 mLs by mouth 2 (two) times daily.   metoprolol tartrate 25 MG  tablet Commonly known as:  LOPRESSOR Take 25 mg by mouth 2 (two) times daily.   oxyCODONE-acetaminophen 5-325 MG tablet Commonly known as:  PERCOCET/ROXICET Take 1 tablet by mouth every 6 (six) hours as needed for severe pain.   pantoprazole 40 MG tablet Commonly known as:  PROTONIX Take 1 tablet (40 mg total) by mouth 2 (two) times daily.   sucralfate 1 g tablet Commonly known as:  CARAFATE Take 1 tablet (1 g total) by mouth 4 (four) times daily -  with meals and at bedtime.       Discharge Assessment: Vitals:   10/01/18 0641 10/01/18 0845  BP: 111/80 96/68  Pulse: 75   Resp: (!) 23 15  Temp: 97.6 F (36.4 C) 97.9 F (36.6 C)  SpO2:  100%   Skin clean, dry and intact without evidence of skin break down, no evidence of skin tears noted. IV catheter discontinued intact. Site without signs and symptoms of complications - no redness or edema noted at insertion site, patient denies c/o pain - only slight tenderness at site.  Dressing with slight pressure applied.  D/c Instructions-Education: Discharge instructions given to patient/family with verbalized understanding. D/c education completed with patient/family including follow up instructions, medication list, d/c activities limitations if indicated, with other d/c instructions as indicated by MD - patient able to verbalize understanding, all questions fully answered. Patient instructed to  return to ED, call 911, or call MD for any changes in condition.  Patient escorted via Box, and D/C home via private auto.  Hiram Comber, RN 10/01/2018 11:07 AM

## 2018-10-01 NOTE — Progress Notes (Addendum)
Progress Note    ASSESSMENT AND PLAN:    58 yo female with recurrent GI bleed / anemia. EGD yesterday remarkable for esophagitis, clean based, non-bleeding gastric ulcer and gastric inflammation / friability  (biopsies for H.Pylori pending) -hgb drifted slightly overnight but no active bleeding  -she is stable for discharge from GI standpoint.  -Continue BID PPI indefinitely  -Carafate QID x one month -follow up in office PRN -Will call with biopsy -IV iron through dialysis  2. ESRD / HCV without known cirrhosis   SUBJECTIVE   No complaints, tolerated breakfast. Passed firm dark stool but no bleeding.    OBJECTIVE:     EGD yesterday =  LA Grade C esophagitis without bleeding, a small HH, one non-bleeding clean based gastric ulcer, erosions / erythema / friability in stomach / pylorus ( biopsies pending)  Vital signs in last 24 hours: Temp:  [97.3 F (36.3 C)-99.1 F (37.3 C)] 97.9 F (36.6 C) (05/03 0845) Pulse Rate:  [75-98] 75 (05/03 0641) Resp:  [15-29] 15 (05/03 0845) BP: (96-147)/(62-82) 96/68 (05/03 0845) SpO2:  [96 %-100 %] 100 % (05/03 0845) Weight:  [52.2 kg] 52.2 kg (05/02 1110) Last BM Date: 09/29/18 General:   Alert, well-developed female in NAD EENT:  Normal hearing.  Pulm: Normal respiratory effort Abdomen:  Soft, nondistended, nontender.  Neurologic:  Alert and  oriented x4;  grossly normal neurologically. Psych:  Pleasant, cooperative.  Normal mood and affect.   Intake/Output from previous day: 05/02 0701 - 05/03 0700 In: 754.8 [P.O.:240; I.V.:100; Blood:315; IV Piggyback:99.8] Out: 1000  Intake/Output this shift: No intake/output data recorded.  Lab Results: Recent Labs    09/30/18 0208 09/30/18 1816 10/01/18 0244  WBC 14.5* 11.6* 8.6  HGB 6.6* 8.5* 7.9*  HCT 20.1* 24.4* 23.2*  PLT 124* 96* 82*   BMET Recent Labs    09/29/18 1046 09/30/18 0203 10/01/18 0244  NA 135 137 135  K 5.2* 4.6 3.0*  CL 97* 100 97*  CO2 21* 21*  25  GLUCOSE 77 78 74  BUN 61* 74* 21*  CREATININE 7.85* 8.81* 4.51*  CALCIUM 8.9 8.6* 8.0*   LFT Recent Labs    09/29/18 1046  PROT 5.5*  ALBUMIN 2.3*  AST 32  ALT 20  ALKPHOS 58  BILITOT 0.7   PT/INR No results for input(s): LABPROT, INR in the last 72 hours. Hepatitis Panel No results for input(s): HEPBSAG, HCVAB, HEPAIGM, HEPBIGM in the last 72 hours.  Ct Abdomen Pelvis Wo Contrast  Result Date: 09/29/2018 CLINICAL DATA:  Acute abdominal pain.  Bloody stool. EXAM: CT ABDOMEN AND PELVIS WITHOUT CONTRAST TECHNIQUE: Multidetector CT imaging of the abdomen and pelvis was performed following the standard protocol without IV contrast. COMPARISON:  None. FINDINGS: LOWER CHEST: Small right basilar effusion.  Mild cardiomegaly. HEPATOBILIARY: The hepatic contours and density are normal. There is no intra- or extrahepatic biliary dilatation. Gallbladder absent or nondistended. PANCREAS: The pancreatic parenchymal contours are normal and there is no ductal dilatation. There is no peripancreatic fluid collection. SPLEEN: Normal. ADRENALS/URINARY TRACT: --Adrenal glands: Normal. --Right kidney/ureter: Generalized atrophy with multiple calcifications. --Left kidney/ureter: Generalized atrophy with multiple calcifications. --Urinary bladder: Normal for degree of distention STOMACH/BOWEL: --Stomach/Duodenum: There is no hiatal hernia or other gastric abnormality. The duodenal course and caliber are normal. --Small bowel: No dilatation or inflammation. --Colon: No focal abnormality. --Appendix: Normal. VASCULAR/LYMPHATIC: There is aortic atherosclerosis without hemodynamically significant stenosis. No abdominal or pelvic lymphadenopathy. REPRODUCTIVE: Status post hysterectomy. No adnexal mass.  MUSCULOSKELETAL. Severe L5-S1 disc space narrowing with left-greater-than-right neural foraminal stenosis. OTHER: None. IMPRESSION: 1. No acute abnormality of the abdomen or pelvis. 2. Small right pleural effusion and  mild cardiomegaly. 3. Severe L5-S1 degenerative disc disease with moderate-to-severe bilateral neural foraminal stenosis. 4. Bilateral renal atrophy. Electronically Signed   By: Ulyses Jarred M.D.   On: 09/29/2018 14:33    Principal Problem:   Gastrointestinal bleeding Active Problems:   Abdominal pain   Gastric ulcer   Hyperkalemia   MSSA bacteremia   Chronic hepatitis C ([place])   GI bleed     LOS: 1 day   Tye Savoy ,NP 10/01/2018, 9:43 AM    Attending physician's note   I have taken an interval history, reviewed the chart and examined the patient. I agree with the Advanced Practitioner's note, impression and recommendations.   Tolerating diet  EGD with gastric ulcer and erosions with nodularity, biopsies pending Hgb 6.6 -->8.5 s/p 1uPRBC--->7.9, exaggerated response after transfusion, equilibrating. No further bleeding Continue PPI and carafate We will sign off, please call with questions  K. Denzil Magnuson , MD [phone]

## 2018-10-02 ENCOUNTER — Ambulatory Visit: Payer: Self-pay | Admitting: *Deleted

## 2018-10-02 ENCOUNTER — Encounter (HOSPITAL_COMMUNITY): Payer: Self-pay | Admitting: Gastroenterology

## 2018-10-02 ENCOUNTER — Other Ambulatory Visit: Payer: Self-pay | Admitting: *Deleted

## 2018-10-02 LAB — GLUCOSE, CAPILLARY: Glucose-Capillary: 242 mg/dL — ABNORMAL HIGH (ref 70–99)

## 2018-10-02 NOTE — Patient Outreach (Addendum)
[facility]) Care Management  10/02/2018  CHINMAYI RUMER [birth year] [id number]  Transition of care telephone call  Referral received:09/18/18 Initial outreach:09/21/18 Insurance: [employer]   Subjective: Third unsuccessful telephone call to patient's homenumber (invalid cell phone number) in order to complete transition of care assessment; no answer, left HIPAA compliant message requesting return call.   Objective: Per the electronic medical record, Mrs. Cheese hospitalized at [facility] from 4/17-4/21 forMethicillin-sensitiveStaphylococcusaureussepsislikely due to her dialysis catheter and symptomatic anemia with suspected subacute upper GI bleed.Comorbidities include:CHF, HTN,cirrhosis of liver, GERD, gastric ulcer, ESRD on HD, Hepatitis C, malnutrition, and tobacco abuse.She was discharged to home on4/21/20. She is to receive cefepime 2 gm IV at dialysis on Tuesday, Thursday and Saturday for 11 days. Mrs. Wiedman was admitted on 09/26/18 for ultrasound-guided insertion of Palidrome catheter in  right internal jugular vein; she was discharged the same day. She was admitted again on 09/29/18 for GI bleed, she received 3 units of blood as her hemoglobin on admission was 5.8. She had an EGD on 09/30/18 that showed reflux esophagitis, small hiatal hernia, nonbleeding gastric ulcer, erosive nodular gastritis, and a nonbleeding duodenal diverticulum. She was discharged to home on 10/01/18 without the need for home helath servcies or DME per the discharge summary.  Plan: If no return call from patient, this RNCM will close to [facility] services 10 business days from the initial outreach, on 10/04/18.   Barrington Ellison RN,CCM,CDE [place] Management Coordinator Office Phone [phone] Office Fax [phone]

## 2018-10-03 DIAGNOSIS — Z992 Dependence on renal dialysis: Secondary | ICD-10-CM | POA: Diagnosis not present

## 2018-10-03 DIAGNOSIS — N186 End stage renal disease: Secondary | ICD-10-CM | POA: Diagnosis not present

## 2018-10-03 DIAGNOSIS — D631 Anemia in chronic kidney disease: Secondary | ICD-10-CM | POA: Diagnosis not present

## 2018-10-03 DIAGNOSIS — N2581 Secondary hyperparathyroidism of renal origin: Secondary | ICD-10-CM | POA: Diagnosis not present

## 2018-10-03 DIAGNOSIS — D696 Thrombocytopenia, unspecified: Secondary | ICD-10-CM | POA: Diagnosis not present

## 2018-10-03 DIAGNOSIS — R509 Fever, unspecified: Secondary | ICD-10-CM | POA: Diagnosis not present

## 2018-10-05 ENCOUNTER — Emergency Department (HOSPITAL_COMMUNITY): Payer: Medicare Other

## 2018-10-05 ENCOUNTER — Encounter (HOSPITAL_COMMUNITY): Payer: Self-pay | Admitting: Emergency Medicine

## 2018-10-05 ENCOUNTER — Inpatient Hospital Stay (HOSPITAL_COMMUNITY)
Admission: EM | Admit: 2018-10-05 | Discharge: 2018-10-07 | DRG: 377 | Disposition: A | Discharge status: Home / Self Care | Payer: Medicare Other | Attending: Internal Medicine | Admitting: Internal Medicine

## 2018-10-05 ENCOUNTER — Other Ambulatory Visit: Payer: Self-pay

## 2018-10-05 DIAGNOSIS — J45909 Unspecified asthma, uncomplicated: Secondary | ICD-10-CM | POA: Diagnosis present

## 2018-10-05 DIAGNOSIS — Z03818 Encounter for observation for suspected exposure to other biological agents ruled out: Secondary | ICD-10-CM | POA: Diagnosis not present

## 2018-10-05 DIAGNOSIS — K259 Gastric ulcer, unspecified as acute or chronic, without hemorrhage or perforation: Secondary | ICD-10-CM | POA: Diagnosis not present

## 2018-10-05 DIAGNOSIS — Z1159 Encounter for screening for other viral diseases: Secondary | ICD-10-CM

## 2018-10-05 DIAGNOSIS — Z832 Family history of diseases of the blood and blood-forming organs and certain disorders involving the immune mechanism: Secondary | ICD-10-CM

## 2018-10-05 DIAGNOSIS — K210 Gastro-esophageal reflux disease with esophagitis: Secondary | ICD-10-CM | POA: Diagnosis present

## 2018-10-05 DIAGNOSIS — D696 Thrombocytopenia, unspecified: Secondary | ICD-10-CM | POA: Diagnosis not present

## 2018-10-05 DIAGNOSIS — Z79899 Other long term (current) drug therapy: Secondary | ICD-10-CM | POA: Diagnosis not present

## 2018-10-05 DIAGNOSIS — K449 Diaphragmatic hernia without obstruction or gangrene: Secondary | ICD-10-CM

## 2018-10-05 DIAGNOSIS — D500 Iron deficiency anemia secondary to blood loss (chronic): Secondary | ICD-10-CM | POA: Diagnosis not present

## 2018-10-05 DIAGNOSIS — K222 Esophageal obstruction: Secondary | ICD-10-CM | POA: Diagnosis present

## 2018-10-05 DIAGNOSIS — K277 Chronic peptic ulcer, site unspecified, without hemorrhage or perforation: Secondary | ICD-10-CM | POA: Diagnosis not present

## 2018-10-05 DIAGNOSIS — N186 End stage renal disease: Secondary | ICD-10-CM | POA: Diagnosis present

## 2018-10-05 DIAGNOSIS — Z8619 Personal history of other infectious and parasitic diseases: Secondary | ICD-10-CM | POA: Diagnosis not present

## 2018-10-05 DIAGNOSIS — K5711 Diverticulosis of small intestine without perforation or abscess with bleeding: Secondary | ICD-10-CM | POA: Diagnosis present

## 2018-10-05 DIAGNOSIS — D649 Anemia, unspecified: Secondary | ICD-10-CM | POA: Diagnosis not present

## 2018-10-05 DIAGNOSIS — I10 Essential (primary) hypertension: Secondary | ICD-10-CM | POA: Diagnosis present

## 2018-10-05 DIAGNOSIS — I959 Hypotension, unspecified: Secondary | ICD-10-CM | POA: Diagnosis not present

## 2018-10-05 DIAGNOSIS — I120 Hypertensive chronic kidney disease with stage 5 chronic kidney disease or end stage renal disease: Secondary | ICD-10-CM | POA: Diagnosis not present

## 2018-10-05 DIAGNOSIS — K7460 Unspecified cirrhosis of liver: Secondary | ICD-10-CM | POA: Diagnosis present

## 2018-10-05 DIAGNOSIS — E161 Other hypoglycemia: Secondary | ICD-10-CM | POA: Diagnosis not present

## 2018-10-05 DIAGNOSIS — R109 Unspecified abdominal pain: Secondary | ICD-10-CM | POA: Diagnosis not present

## 2018-10-05 DIAGNOSIS — Z881 Allergy status to other antibiotic agents status: Secondary | ICD-10-CM

## 2018-10-05 DIAGNOSIS — E162 Hypoglycemia, unspecified: Secondary | ICD-10-CM | POA: Diagnosis not present

## 2018-10-05 DIAGNOSIS — Z8249 Family history of ischemic heart disease and other diseases of the circulatory system: Secondary | ICD-10-CM | POA: Diagnosis not present

## 2018-10-05 DIAGNOSIS — D62 Acute posthemorrhagic anemia: Secondary | ICD-10-CM | POA: Diagnosis present

## 2018-10-05 DIAGNOSIS — I5032 Chronic diastolic (congestive) heart failure: Secondary | ICD-10-CM | POA: Diagnosis present

## 2018-10-05 DIAGNOSIS — K922 Gastrointestinal hemorrhage, unspecified: Secondary | ICD-10-CM | POA: Diagnosis present

## 2018-10-05 DIAGNOSIS — R58 Hemorrhage, not elsewhere classified: Secondary | ICD-10-CM | POA: Diagnosis not present

## 2018-10-05 DIAGNOSIS — Z90710 Acquired absence of both cervix and uterus: Secondary | ICD-10-CM

## 2018-10-05 DIAGNOSIS — R0602 Shortness of breath: Secondary | ICD-10-CM | POA: Diagnosis not present

## 2018-10-05 DIAGNOSIS — K921 Melena: Secondary | ICD-10-CM | POA: Diagnosis not present

## 2018-10-05 DIAGNOSIS — I132 Hypertensive heart and chronic kidney disease with heart failure and with stage 5 chronic kidney disease, or end stage renal disease: Secondary | ICD-10-CM | POA: Diagnosis present

## 2018-10-05 DIAGNOSIS — Z681 Body mass index (BMI) 19 or less, adult: Secondary | ICD-10-CM | POA: Diagnosis not present

## 2018-10-05 DIAGNOSIS — K254 Chronic or unspecified gastric ulcer with hemorrhage: Secondary | ICD-10-CM | POA: Diagnosis present

## 2018-10-05 DIAGNOSIS — R636 Underweight: Secondary | ICD-10-CM | POA: Diagnosis present

## 2018-10-05 DIAGNOSIS — B182 Chronic viral hepatitis C: Secondary | ICD-10-CM | POA: Diagnosis present

## 2018-10-05 DIAGNOSIS — D6959 Other secondary thrombocytopenia: Secondary | ICD-10-CM | POA: Diagnosis present

## 2018-10-05 DIAGNOSIS — K2971 Gastritis, unspecified, with bleeding: Secondary | ICD-10-CM

## 2018-10-05 DIAGNOSIS — D631 Anemia in chronic kidney disease: Secondary | ICD-10-CM | POA: Diagnosis not present

## 2018-10-05 DIAGNOSIS — Z992 Dependence on renal dialysis: Secondary | ICD-10-CM

## 2018-10-05 DIAGNOSIS — Z833 Family history of diabetes mellitus: Secondary | ICD-10-CM | POA: Diagnosis not present

## 2018-10-05 DIAGNOSIS — F17210 Nicotine dependence, cigarettes, uncomplicated: Secondary | ICD-10-CM | POA: Diagnosis present

## 2018-10-05 LAB — CBC WITH DIFFERENTIAL/PLATELET
Abs Immature Granulocytes: 0.08 10*3/uL — ABNORMAL HIGH (ref 0.00–0.07)
Basophils Absolute: 0 10*3/uL (ref 0.0–0.1)
Basophils Relative: 0 %
Eosinophils Absolute: 0 10*3/uL (ref 0.0–0.5)
Eosinophils Relative: 0 %
HCT: 18.8 % — ABNORMAL LOW (ref 36.0–46.0)
Hemoglobin: 5.6 g/dL — CL (ref 12.0–15.0)
Immature Granulocytes: 1 %
Lymphocytes Relative: 16 %
Lymphs Abs: 1.4 10*3/uL (ref 0.7–4.0)
MCH: 31.3 pg (ref 26.0–34.0)
MCHC: 29.8 g/dL — ABNORMAL LOW (ref 30.0–36.0)
MCV: 105 fL — ABNORMAL HIGH (ref 80.0–100.0)
Monocytes Absolute: 0.6 10*3/uL (ref 0.1–1.0)
Monocytes Relative: 7 %
Neutro Abs: 6.7 10*3/uL (ref 1.7–7.7)
Neutrophils Relative %: 76 %
Platelets: 111 10*3/uL — ABNORMAL LOW (ref 150–400)
RBC: 1.79 MIL/uL — ABNORMAL LOW (ref 3.87–5.11)
RDW: 21 % — ABNORMAL HIGH (ref 11.5–15.5)
WBC: 8.8 10*3/uL (ref 4.0–10.5)
nRBC: 0.2 % (ref 0.0–0.2)

## 2018-10-05 LAB — COMPREHENSIVE METABOLIC PANEL
ALT: 25 U/L (ref 0–44)
AST: 31 U/L (ref 15–41)
Albumin: 2 g/dL — ABNORMAL LOW (ref 3.5–5.0)
Alkaline Phosphatase: 44 U/L (ref 38–126)
Anion gap: 15 (ref 5–15)
BUN: 71 mg/dL — ABNORMAL HIGH (ref 6–20)
CO2: 22 mmol/L (ref 22–32)
Calcium: 8.6 mg/dL — ABNORMAL LOW (ref 8.9–10.3)
Chloride: 98 mmol/L (ref 98–111)
Creatinine, Ser: 8 mg/dL — ABNORMAL HIGH (ref 0.44–1.00)
GFR calc Af Amer: 6 mL/min — ABNORMAL LOW (ref >60)
GFR calc non Af Amer: 5 mL/min — ABNORMAL LOW (ref >60)
Glucose, Bld: 91 mg/dL (ref 70–99)
Potassium: 4.3 mmol/L (ref 3.5–5.1)
Sodium: 135 mmol/L (ref 135–145)
Total Bilirubin: 0.5 mg/dL (ref 0.3–1.2)
Total Protein: 4.6 g/dL — ABNORMAL LOW (ref 6.5–8.1)

## 2018-10-05 LAB — PROTIME-INR
INR: 1.2 (ref 0.8–1.2)
Prothrombin Time: 15.4 seconds — ABNORMAL HIGH (ref 11.4–15.2)

## 2018-10-05 LAB — LIPASE, BLOOD: Lipase: 24 U/L (ref 11–51)

## 2018-10-05 LAB — CBG MONITORING, ED
Glucose-Capillary: 55 mg/dL — ABNORMAL LOW (ref 70–99)
Glucose-Capillary: 141 mg/dL — ABNORMAL HIGH (ref 70–99)

## 2018-10-05 LAB — POC OCCULT BLOOD, ED: Fecal Occult Bld: POSITIVE — AB

## 2018-10-05 LAB — SARS CORONAVIRUS 2 BY RT PCR (HOSPITAL ORDER, PERFORMED IN ~~LOC~~ HOSPITAL LAB): SARS Coronavirus 2: NEGATIVE

## 2018-10-05 LAB — PREPARE RBC (CROSSMATCH)

## 2018-10-05 LAB — TROPONIN I: Troponin I: 0.04 ng/mL (ref <0.03)

## 2018-10-05 MED ORDER — PENTAFLUOROPROP-TETRAFLUOROETH EX AERO: 1.0000 "application " | INHALATION_SPRAY | CUTANEOUS | Status: DC | PRN

## 2018-10-05 MED ORDER — HEPARIN SODIUM (PORCINE) 1000 UNIT/ML IJ SOLN
INTRAMUSCULAR | Status: AC
  Administered 2018-10-05: 3400 [IU]
  Filled 2018-10-05: qty 4

## 2018-10-05 MED ORDER — LIDOCAINE HCL (PF) 1 % IJ SOLN: 5.0000 mL | INTRAMUSCULAR | Status: DC | PRN

## 2018-10-05 MED ORDER — ACETAMINOPHEN 500 MG PO TABS: 500.0000 mg | ORAL_TABLET | Freq: Four times a day (QID) | ORAL | Status: DC | PRN

## 2018-10-05 MED ORDER — ONDANSETRON HCL 4 MG/2ML IJ SOLN
4.0000 mg | Freq: Four times a day (QID) | INTRAMUSCULAR | Status: DC
  Administered 2018-10-06 – 2018-10-07 (×4): 4 mg via INTRAVENOUS
  Filled 2018-10-05 (×6): qty 2

## 2018-10-05 MED ORDER — PRO-STAT SUGAR FREE PO LIQD
30.0000 mL | Freq: Two times a day (BID) | ORAL | Status: DC
  Administered 2018-10-05 – 2018-10-07 (×3): 30 mL via ORAL
  Filled 2018-10-05 (×3): qty 30

## 2018-10-05 MED ORDER — SODIUM CHLORIDE 0.9 % IV SOLN: 100.0000 mL | INTRAVENOUS | Status: DC | PRN

## 2018-10-05 MED ORDER — LIDOCAINE-PRILOCAINE 2.5-2.5 % EX CREA: 1.0000 "application " | TOPICAL_CREAM | CUTANEOUS | Status: DC | PRN

## 2018-10-05 MED ORDER — SODIUM CHLORIDE 0.9 % IV SOLN
80.0000 mg | Freq: Once | INTRAVENOUS | Status: DC
  Filled 2018-10-05 (×2): qty 80

## 2018-10-05 MED ORDER — HEPARIN SODIUM (PORCINE) 1000 UNIT/ML DIALYSIS: 1000.0000 [IU] | INTRAMUSCULAR | Status: DC | PRN

## 2018-10-05 MED ORDER — RENA-VITE PO TABS
1.0000 | ORAL_TABLET | Freq: Every day | ORAL | Status: DC
  Administered 2018-10-05 – 2018-10-06 (×2): 1 via ORAL
  Filled 2018-10-05 (×2): qty 1

## 2018-10-05 MED ORDER — ALTEPLASE 2 MG IJ SOLR: 2.0000 mg | Freq: Once | INTRAMUSCULAR | Status: DC | PRN

## 2018-10-05 MED ORDER — DEXTROSE 50 % IV SOLN
1.0000 | Freq: Once | INTRAVENOUS | Status: AC
  Administered 2018-10-05: 14:00:00 50 mL via INTRAVENOUS

## 2018-10-05 MED ORDER — NEPRO/CARBSTEADY PO LIQD
237.0000 mL | Freq: Two times a day (BID) | ORAL | Status: DC
  Administered 2018-10-07: 237 mL via ORAL
  Filled 2018-10-05 (×4): qty 237

## 2018-10-05 MED ORDER — CHLORHEXIDINE GLUCONATE CLOTH 2 % EX PADS
6.0000 | MEDICATED_PAD | Freq: Every day | CUTANEOUS | Status: DC
  Administered 2018-10-06 – 2018-10-07 (×2): 6 via TOPICAL

## 2018-10-05 MED ORDER — ALBUTEROL SULFATE (2.5 MG/3ML) 0.083% IN NEBU: 2.5000 mg | INHALATION_SOLUTION | Freq: Four times a day (QID) | RESPIRATORY_TRACT | Status: DC | PRN

## 2018-10-05 MED ORDER — DEXTROSE 50 % IV SOLN
INTRAVENOUS | Status: AC
  Administered 2018-10-05: 50 mL via INTRAVENOUS
  Filled 2018-10-05: qty 50

## 2018-10-05 MED ORDER — SODIUM CHLORIDE 0.9 % IV SOLN: 10.0000 mL/h | Freq: Once | INTRAVENOUS | Status: DC

## 2018-10-05 MED ORDER — SUCRALFATE 1 G PO TABS
1.0000 g | ORAL_TABLET | Freq: Three times a day (TID) | ORAL | Status: DC
  Administered 2018-10-05 – 2018-10-07 (×4): 1 g via ORAL
  Filled 2018-10-05 (×5): qty 1

## 2018-10-05 NOTE — Progress Notes (Signed)
Responded to PIV consult. Pt being transported to inpatient room

## 2018-10-05 NOTE — H&P (Signed)
History and Physical    Karen Morrow JAS:[id number] DOB: [birth year] DOA: 10/05/2018  PCP: [name], MD  Patient coming from: Home  I have personally briefly reviewed patient's old medical records in [place]  Chief Complaint: Melanotic stools since this morning  HPI: Karen Morrow is a 58 y.o. female with medical history significant of end-stage renal disease on dialysis on Tuesdays, Thursdays Saturdays, diastolic heart failure, hypertension, GERD, and recent history of MSSA bacteremia and sepsis, GI bleed secondary to reflux esophagitis and a gastric ulcer presents today with symptomatic anemia with a hemoglobin of 5.6 and bloody and melanotic stools since this morning.  Patient reports mild generalized abdominal pain associated with some nausea.  She denies any vomiting or diarrhea.  She denies fevers, chills, shortness of breath or chest pain or syncope.  She denies any dysuria.  She denies any headache or dizziness or any tingling or numbness.  ED Course: On arrival to emergency department patient was found to be afebrile, heart rate of 105/min, respiratory rate of 20/min and a blood pressure of 84/68.  Labs were significant with for a hemoglobin of 5.6, normal WBC count, platelets of 111, MCV of 105 hematocrit of 18, INR of 1.2.  CMP sample was hemolyzed and repeat CMP ordered. Stool for occult blood was positive, COVID-19 screen was negative.  Chest x-ray was negative.  GI consulted and plan for EGD tomorrow.  She will be on clear liquids tonight and n.p.o. after midnight.  She was started on IV Protonix and referred to [facility] hospital[facility] for admission   Review of Systems: As per HPI otherwise 10 point review of systems negative.    Past Medical History:  Diagnosis Date   Anginal pain ([place])    Asthma    CHF (congestive heart failure) (HCC)    Chronic kidney disease    dialysis 3x wk   Dyspnea    Frequent bowel movements    GERD (gastroesophageal  reflux disease)    Hepatitis C antibody test positive    Hypertension    "just dx'd today" (11/03/2012)    Past Surgical History:  Procedure Laterality Date   ABDOMINAL HYSTERECTOMY     "partial" (11/03/2012)   AV FISTULA PLACEMENT Right 12/03/2013   Procedure: CREATION OF ARTERIOVENOUS (AV) FISTULA RIGHT ARM ;  Surgeon: Rosetta Posner, MD;  Location: [place];  Service: Vascular;  Laterality: Right;   AV FISTULA PLACEMENT Left 05/17/2018   Procedure: CREATION OF BRACHIOCEPHALIC FISTULA LEFT ARM;  Surgeon: Waynetta Sandy, MD;  Location: [place];  Service: Vascular;  Laterality: Left;   [place] REMOVAL Right 03/16/2018   Procedure: REMOVAL OF ARTERIOVENOUS GORETEX GRAFT ([place]) RIGHT ARM;  Surgeon: Waynetta Sandy, MD;  Location: [place];  Service: Vascular;  Laterality: Right;   [place] Right 03/06/2014   Procedure: SECOND STAGE BASILIC VEIN TRANSPOSITION;  Surgeon: Rosetta Posner, MD;  Location: [place];  Service: Vascular;  Laterality: Right;   BIOPSY  09/30/2018   Procedure: BIOPSY;  Surgeon: Mauri Pole, MD;  Location: [place] [facility];  Service: Endoscopy;;   ESOPHAGOGASTRODUODENOSCOPY N/A 06/05/2013   Procedure: ESOPHAGOGASTRODUODENOSCOPY (EGD);  Surgeon: Winfield Cunas., MD;  Location: [facility];  Service: Endoscopy;  Laterality: N/A;   ESOPHAGOGASTRODUODENOSCOPY N/A 09/08/2016   Procedure: ESOPHAGOGASTRODUODENOSCOPY (EGD);  Surgeon: Milus Banister, MD;  Location: [place];  Service: Endoscopy;  Laterality: N/A;   ESOPHAGOGASTRODUODENOSCOPY (EGD) WITH PROPOFOL N/A 09/17/2018   Procedure: ESOPHAGOGASTRODUODENOSCOPY (EGD) WITH PROPOFOL;  Surgeon: Carol Ada, MD;  Location: [place];  Service: Endoscopy;  Laterality: N/A;   ESOPHAGOGASTRODUODENOSCOPY (EGD) WITH PROPOFOL N/A 09/30/2018   Procedure: ESOPHAGOGASTRODUODENOSCOPY (EGD) WITH PROPOFOL;  Surgeon: Mauri Pole, MD;  Location: [facility];  Service: Endoscopy;  Laterality: N/A;     INSERTION OF DIALYSIS CATHETER Left 03/16/2018   Procedure: PLACEMENT OF TUNNEL DIALYSIS CATHETER;  Surgeon: Waynetta Sandy, MD;  Location: [place];  Service: Vascular;  Laterality: Left;   INSERTION OF DIALYSIS CATHETER Right 09/26/2018   Procedure: INSERTION OF DIALYSIS  TUNNELED CATHETER;  Surgeon: Elam Dutch, MD;  Location: [facility] OR;  Service: Vascular;  Laterality: Right;   IR REMOVAL TUN CV CATH W/O FL  09/17/2018   REVISON OF ARTERIOVENOUS FISTULA Right 01/28/2017   Procedure: REVISON OF RIGHT ARM ARTERIOVENOUS FISTULA USING 8MMX10CM GORETEX GRAFT;  Surgeon: Angelia Mould, MD;  Location: [place];  Service: Vascular;  Laterality: Right;     reports that she has been smoking cigarettes. She has a 10.00 pack-year smoking history. She has never used smokeless tobacco. She reports previous alcohol use of about 1.0 standard drinks of alcohol per week. She reports previous drug use. Drug: Marijuana.  Allergies  Allergen Reactions   Cefazolin Other (See Comments)    Severe thrombocytopenia (has tolerated zosyn in the past) ID aware Tolerated in October 2019    Family History  Problem Relation Age of Onset   Lupus Mother    Diabetes Father    Heart attack Father     Family history reviewed and not pertinent   Prior to Admission medications   Medication Sig Start Date End Date Taking? Authorizing Provider  acetaminophen (TYLENOL) 500 MG tablet Take 500-1,000 mg by mouth every 6 (six) hours as needed for headache (pain).    [provider]  albuterol (PROVENTIL HFA;VENTOLIN HFA) 108 (90 Base) MCG/ACT inhaler TAKE 2 PUFFS BY MOUTH EVERY 6 HOURS AS NEEDED FOR WHEEZE OR SHORTNESS OF BREATH Patient taking differently: Inhale 2 puffs into the lungs every 6 (six) hours as needed for wheezing.  05/09/18   [name], MD  albuterol (PROVENTIL) (2.5 MG/3ML) 0.083% nebulizer solution Take 3 mLs (2.5 mg total) by nebulization every 6 (six) hours as needed  for wheezing or shortness of breath. MUST MAKE APPT FOR FURTHER REFILLS 08/15/18   [name], MD  Amino Acids-Protein Hydrolys (FEEDING SUPPLEMENT, PRO-STAT SUGAR FREE 64,) LIQD Take 30 mLs by mouth 2 (two) times daily. 09/19/18   Hosie Poisson, MD  amLODipine (NORVASC) 10 MG tablet Take 1 tablet (10 mg total) by mouth daily. Patient taking differently: Take 10 mg by mouth at bedtime.  05/16/18   Thurnell Lose, MD  b complex-vitamin c-folic acid (NEPHRO-VITE) 0.8 MG TABS tablet Take 1 tablet by mouth every evening. 07/27/18   [provider]  cinacalcet (SENSIPAR) 30 MG tablet TAKE 1 TABLET BY MOUTH DAILY EVERY EVENING (DO NOT TAKE LESS THAN 12 HOURS PRIOR TO DIALYSIS) 09/19/18   Hosie Poisson, MD  ferric citrate (AURYXIA) 1 GM 210 MG(Fe) tablet Take 420 mg by mouth 3 (three) times daily with meals.    [provider]  metoprolol tartrate (LOPRESSOR) 25 MG tablet Take 25 mg by mouth 2 (two) times daily.    [provider]  Nutritional Supplements (FEEDING SUPPLEMENT, NEPRO CARB STEADY,) LIQD Take 237 mLs by mouth 2 (two) times daily between meals. 10/01/18   Mercy Riding, MD  oxyCODONE-acetaminophen (PERCOCET/ROXICET) 5-325 MG tablet Take 1  tablet by mouth every 6 (six) hours as needed for severe pain. 09/26/18   Elam Dutch, MD  pantoprazole (PROTONIX) 40 MG tablet Take 1 tablet (40 mg total) by mouth 2 (two) times daily. 10/01/18   Mercy Riding, MD  sucralfate (CARAFATE) 1 g tablet Take 1 tablet (1 g total) by mouth 4 (four) times daily -  with meals and at bedtime. 10/01/18   Mercy Riding, MD    Physical Exam: Vitals:   10/05/18 1659 10/05/18 1700 10/05/18 1715 10/05/18 1730  BP: 100/71 93/71 99/75  93/65  Pulse: 100   (!) 105  Resp: 17 18 (!) 22 20  Temp: 98.5 F (36.9 C)     TempSrc: Oral     SpO2: 100%   100%  Weight:      Height:        Constitutional: Moderate distress from nausea Vitals:   10/05/18 1659 10/05/18 1700 10/05/18 1715 10/05/18  1730  BP: 100/71 93/71 99/75  93/65  Pulse: 100   (!) 105  Resp: 17 18 (!) 22 20  Temp: 98.5 F (36.9 C)     TempSrc: Oral     SpO2: 100%   100%  Weight:      Height:       Eyes: PERRL, lids and conjunctivae normal ENMT: Mucous membranes are moist. Posterior pharynx clear of any exudate or lesions.Normal dentition.  Neck: normal, supple, no masses, no thyromegaly Respiratory: clear to auscultation bilaterally, no wheezing, no crackles. Cardiovascular: Regular rate and rhythm, no murmurs / rubs / gallops. No extremity edema. 2+ pedal pulses. No carotid bruits.  Abdomen: Mild generalized tenderness, soft, no signs of peritonitis, bowel sounds are good  musculoskeletal: no clubbing / cyanosis.  Skin: no rashes, lesions, ulcers. No induration Neurologic: CN 2-12 grossly intact. Sensation intact, DTR normal. Strength 5/5 in all 4.  Psychiatric: . Alert and oriented x 3. Normal mood.     Labs on Admission: I have personally reviewed following labs and imaging studies  CBC: Recent Labs  Lab 09/29/18 1046 09/29/18 2050 09/30/18 0208 09/30/18 1816 10/01/18 0244 10/05/18 1418  WBC 14.9*  --  14.5* 11.6* 8.6 8.8  NEUTROABS 12.9*  --   --   --   --  6.7  HGB 5.8* 6.8* 6.6* 8.5* 7.9* 5.6*  HCT 17.2* 20.1* 20.1* 24.4* 23.2* 18.8*  MCV 92.5  --  90.1 88.4 89.9 105.0*  PLT 132*  --  124* 96* 82* 010*   Basic Metabolic Panel: Recent Labs  Lab 09/29/18 1046 09/30/18 0203 10/01/18 0244  NA 135 137 135  K 5.2* 4.6 3.0*  CL 97* 100 97*  CO2 21* 21* 25  GLUCOSE 77 78 74  BUN 61* 74* 21*  CREATININE 7.85* 8.81* 4.51*  CALCIUM 8.9 8.6* 8.0*   GFR: Estimated Creatinine Clearance: 11.2 mL/min (A) (by C-G formula based on SCr of 4.51 mg/dL (H)). Liver Function Tests: Recent Labs  Lab 09/29/18 1046  AST 32  ALT 20  ALKPHOS 58  BILITOT 0.7  PROT 5.5*  ALBUMIN 2.3*   No results for input(s): LIPASE, AMYLASE in the last 168 hours. No results for input(s): AMMONIA in the last  168 hours. Coagulation Profile: Recent Labs  Lab 10/05/18 1620  INR 1.2   Cardiac Enzymes: Recent Labs  Lab 10/05/18 1418  TROPONINI 0.04*   BNP (last 3 results) No results for input(s): PROBNP in the last 8760 hours. HbA1C: No results for input(s): HGBA1C in the last 72  hours. CBG: Recent Labs  Lab 09/29/18 1903 10/05/18 1346 10/05/18 1439  GLUCAP 242* 55* 141*   Lipid Profile: No results for input(s): CHOL, HDL, LDLCALC, TRIG, CHOLHDL, LDLDIRECT in the last 72 hours. Thyroid Function Tests: No results for input(s): TSH, T4TOTAL, FREET4, T3FREE, THYROIDAB in the last 72 hours. Anemia Panel: No results for input(s): VITAMINB12, FOLATE, FERRITIN, TIBC, IRON, RETICCTPCT in the last 72 hours. Urine analysis:    Component Value Date/Time   COLORURINE YELLOW 11/26/2013 1819   APPEARANCEUR TURBID (A) 11/26/2013 1819   LABSPEC 1.021 11/26/2013 1819   PHURINE 5.5 11/26/2013 1819   GLUCOSEU NEGATIVE 11/26/2013 1819   HGBUR SMALL (A) 11/26/2013 1819   BILIRUBINUR NEGATIVE 11/26/2013 1819   KETONESUR NEGATIVE 11/26/2013 1819   PROTEINUR >300 (A) 11/26/2013 1819   UROBILINOGEN 0.2 11/26/2013 1819   NITRITE POSITIVE (A) 11/26/2013 1819   LEUKOCYTESUR MODERATE (A) 11/26/2013 1819    Radiological Exams on Admission: Dg Chest Portable 1 View  Result Date: 10/05/2018 CLINICAL DATA:  Shortness of breath.  Hypotension. EXAM: PORTABLE CHEST 1 VIEW COMPARISON:  Chest x-ray dated September 15, 2018. FINDINGS: The patient is rotated to the right. Interval removal of the left internal jugular tunnel dialysis catheter with placement of a new right internal jugular tunnel dialysis catheter with tip in the proximal right atrium. Stable mild cardiomegaly. Normal mediastinal contours. Normal pulmonary vascularity. No focal consolidation, pleural effusion, or pneumothorax. No acute osseous abnormality. IMPRESSION: No active disease. Electronically Signed   By: Titus Dubin M.D.   On: 10/05/2018  14:39    EKG: Independently reviewed.  Assessment/Plan Active Problems:   GI bleed   Acute GI bleed/ acute blood loss anemia Probably secondary to bleeding gastric ulcer  and esophagitis. She was started on IV Protonix GTT, 2 units of PRBC transfusion ordered by EDP. GI consulted and plan for EGD in the morning. Patient is on clears at this time and will be n.p.o. after midnight.  Continue with Carafate Pain control with Tylenol.  Thrombocytopenia Improving  End-stage renal disease on dialysis Patient missed her dialysis today.  Nephrology consulted and plan for HD tonight.  History of recent MSSA bacteremia Completed the course of cefepime on 10/02/2018  Hypertension Hold amlodipine and metoprolol for borderline blood pressures.  Chronic hepatitis C Outpatient follow-up with PCP/infectious disease.   Patient requires inpatient status due to high intensity of service, high risk for further deterioration and high frequency of surveillance required.  I certify that at this point of admission it is my clinical judgment that the patient will require inpatient hospital care for at least 2 midnights from the point of admission. Patient has multiple chronic comorbidities including hypertension, tobacco abuse, GERD, end-stage renal disease on dialysis.  patient presents with acute GI bleed, anemia of acute blood loss requiring blood transfusion and she will require hemodialysis in the next 24 hours.   DVT prophylaxis: SCDs Code Status: Full code Family Communication none at bedside, discussed the plan with the patient Disposition Plan: Admit to inpatient, pending further work-up by GI Consults called: [facility] called by EDP, Dr. Royce Macadamia with nephrology Admission status: Inpatient/telemetry   Hosie Poisson MD [facility] Pager [phone]  If 7PM-7AM, please contact night-coverage [email or web address] Password [facility]  10/05/2018, 5:41 PM

## 2018-10-05 NOTE — ED Notes (Signed)
ED TO INPATIENT HANDOFF REPORT  ED Nurse Name and Phone #: [phone]   S Name/Age/Gender Karen Morrow 58 y.o. female Room/Bed: 011C/011C  Code Status   Code Status: Prior  Home/SNF/Other Home Patient oriented to: self, place, time and situation Is this baseline? Yes   Triage Complete: Triage complete  Chief Complaint Hemorrhage  Triage Note Pt arrives via EMS from home with reports of rectal bleeding. Pt due for HD today, last HD Tuesday. Endorses chills and sweating. Pt hypotensive   Allergies Allergies  Allergen Reactions  . Cefazolin Other (See Comments)    Severe thrombocytopenia (has tolerated zosyn in the past) ID aware Tolerated in October 2019    Level of Care/Admitting Diagnosis ED Disposition    ED Disposition Condition [place]: [place] [[id number]]  Level of Care: Telemetry Medical [104]  Covid Evaluation: N/A  Diagnosis: GI bleed [408144]  Admitting Physician: Hosie Poisson [[id number]]  Attending Physician: Hosie Poisson [[id number]]  Estimated length of stay: past midnight tomorrow  Certification:: I certify this patient will need inpatient services for at least 2 midnights  PT Class (Do Not Modify): Inpatient [101]  PT Acc Code (Do Not Modify): Private [1]       B Medical/Surgery History Past Medical History:  Diagnosis Date  . Anginal pain ([place])   . Asthma   . CHF (congestive heart failure) ([place])   . Chronic kidney disease    dialysis 3x wk  . Dyspnea   . Frequent bowel movements   . GERD (gastroesophageal reflux disease)   . Hepatitis C antibody test positive   . Hypertension    "just dx'd today" (11/03/2012)   Past Surgical History:  Procedure Laterality Date  . ABDOMINAL HYSTERECTOMY     "partial" (11/03/2012)  . AV FISTULA PLACEMENT Right 12/03/2013   Procedure: CREATION OF ARTERIOVENOUS (AV) FISTULA RIGHT ARM ;  Surgeon: Rosetta Posner, MD;  Location: [facility];  Service: Vascular;  Laterality: Right;   . AV FISTULA PLACEMENT Left 05/17/2018   Procedure: CREATION OF BRACHIOCEPHALIC FISTULA LEFT ARM;  Surgeon: Waynetta Sandy, MD;  Location: [facility];  Service: Vascular;  Laterality: Left;  . [place] REMOVAL Right 03/16/2018   Procedure: REMOVAL OF ARTERIOVENOUS GORETEX GRAFT ([place]) RIGHT ARM;  Surgeon: Waynetta Sandy, MD;  Location: [facility];  Service: Vascular;  Laterality: Right;  . BASCILIC VEIN TRANSPOSITION Right 03/06/2014   Procedure: SECOND STAGE BASILIC VEIN TRANSPOSITION;  Surgeon: Rosetta Posner, MD;  Location: [facility];  Service: Vascular;  Laterality: Right;  . BIOPSY  09/30/2018   Procedure: BIOPSY;  Surgeon: Mauri Pole, MD;  Location: [facility];  Service: Endoscopy;;  . ESOPHAGOGASTRODUODENOSCOPY N/A 06/05/2013   Procedure: ESOPHAGOGASTRODUODENOSCOPY (EGD);  Surgeon: Winfield Cunas., MD;  Location: [facility];  Service: Endoscopy;  Laterality: N/A;  . ESOPHAGOGASTRODUODENOSCOPY N/A 09/08/2016   Procedure: ESOPHAGOGASTRODUODENOSCOPY (EGD);  Surgeon: Milus Banister, MD;  Location: [facility];  Service: Endoscopy;  Laterality: N/A;  . ESOPHAGOGASTRODUODENOSCOPY (EGD) WITH PROPOFOL N/A 09/17/2018   Procedure: ESOPHAGOGASTRODUODENOSCOPY (EGD) WITH PROPOFOL;  Surgeon: Carol Ada, MD;  Location: [facility];  Service: Endoscopy;  Laterality: N/A;  . ESOPHAGOGASTRODUODENOSCOPY (EGD) WITH PROPOFOL N/A 09/30/2018   Procedure: ESOPHAGOGASTRODUODENOSCOPY (EGD) WITH PROPOFOL;  Surgeon: Mauri Pole, MD;  Location: [facility];  Service: Endoscopy;  Laterality: N/A;  . INSERTION OF DIALYSIS CATHETER Left 03/16/2018   Procedure: PLACEMENT OF TUNNEL DIALYSIS CATHETER;  Surgeon: Waynetta Sandy, MD;  Location:  [facility];  Service: Vascular;  Laterality: Left;  . INSERTION OF DIALYSIS CATHETER Right 09/26/2018   Procedure: INSERTION OF DIALYSIS  TUNNELED CATHETER;  Surgeon: Elam Dutch, MD;  Location: [facility] OR;  Service: Vascular;  Laterality: Right;  . IR  REMOVAL TUN CV CATH W/O FL  09/17/2018  . REVISON OF ARTERIOVENOUS FISTULA Right 01/28/2017   Procedure: REVISON OF RIGHT ARM ARTERIOVENOUS FISTULA USING 8MMX10CM GORETEX GRAFT;  Surgeon: Angelia Mould, MD;  Location: [facility] OR;  Service: Vascular;  Laterality: Right;     A IV Location/Drains/Wounds Patient Lines/Drains/Airways Status   Active Line/Drains/Airways    Name:   Placement date:   Placement time:   Site:   Days:   Fistula / Graft   -    -    -      Fistula / Graft Left Upper arm Arteriovenous fistula   05/17/18    0902    Upper arm   141   Hemodialysis Catheter Left Internal jugular Double-lumen   03/16/18    1524    Internal jugular   203   Hemodialysis Catheter Left Subclavian Triple-lumen   09/26/18    1450    Subclavian   9   Hemodialysis Catheter Right   -    -    -      Hemodialysis Catheter Right Internal jugular   -    -    Internal jugular      Airway   09/26/18    1432     9   Incision (Closed) 03/16/18 Chest Left   03/16/18    1553     203   Incision (Closed) 03/16/18 Arm Right   03/16/18    1600     203   Incision (Closed) 05/17/18 Arm Left   05/17/18    0901     141   Incision (Closed) 09/26/18 Chest Right   09/26/18    1458     9          Intake/Output Last 24 hours No intake or output data in the 24 hours ending 10/05/18 1731  Labs/Imaging Results for orders placed or performed during the hospital encounter of 10/05/18 (from the past 48 hour(s))  CBG monitoring, ED     Status: Abnormal   Collection Time: 10/05/18  1:46 PM  Result Value Ref Range   Glucose-Capillary 55 (L) 70 - 99 mg/dL  CBC with Differential     Status: Abnormal   Collection Time: 10/05/18  2:18 PM  Result Value Ref Range   WBC 8.8 4.0 - 10.5 K/uL   RBC 1.79 (L) 3.87 - 5.11 MIL/uL   Hemoglobin 5.6 (LL) 12.0 - 15.0 g/dL    Comment: REPEATED TO VERIFY CRITICAL RESULT CALLED TO, READ BACK BY AND VERIFIED WITH: C Niti Leisure,RN 1647 10/05/2018 WBOND SPECIMEN CHECKED FOR CLOTS     HCT 18.8 (L) 36.0 - 46.0 %   MCV 105.0 (H) 80.0 - 100.0 fL   MCH 31.3 26.0 - 34.0 pg   MCHC 29.8 (L) 30.0 - 36.0 g/dL   RDW 21.0 (H) 11.5 - 15.5 %   Platelets 111 (L) 150 - 400 K/uL    Comment: Immature Platelet Fraction may be clinically indicated, consider ordering this additional test [id number] PLATELET CLUMPS NOTED ON SMEAR, COUNT APPEARS DECREASED    nRBC 0.2 0.0 - 0.2 %   Neutrophils Relative % 76 %   Neutro Abs 6.7 1.7 - 7.7 K/uL  Lymphocytes Relative 16 %   Lymphs Abs 1.4 0.7 - 4.0 K/uL   Monocytes Relative 7 %   Monocytes Absolute 0.6 0.1 - 1.0 K/uL   Eosinophils Relative 0 %   Eosinophils Absolute 0.0 0.0 - 0.5 K/uL   Basophils Relative 0 %   Basophils Absolute 0.0 0.0 - 0.1 K/uL   Immature Granulocytes 1 %   Abs Immature Granulocytes 0.08 (H) 0.00 - 0.07 K/uL    Comment: Performed at [facility] [address], [place], [place] [address]  Troponin I - Once     Status: Abnormal   Collection Time: 10/05/18  2:18 PM  Result Value Ref Range   Troponin I 0.04 (HH) <0.03 ng/mL    Comment: CRITICAL RESULT CALLED TO, READ BACK BY AND VERIFIED WITH: N.KOHUT RN 7867 10/05/2018 MCCORMICK K Performed at [facility], [place] [address], [place], [place] [address]   CBG monitoring, ED     Status: Abnormal   Collection Time: 10/05/18  2:39 PM  Result Value Ref Range   Glucose-Capillary 141 (H) 70 - 99 mg/dL  SARS Coronavirus 2 (CEPHEID - Performed in [facility] hospital lab), Hosp Order     Status: None   Collection Time: 10/05/18  2:48 PM  Result Value Ref Range   SARS Coronavirus 2 NEGATIVE NEGATIVE    Comment: (NOTE) If result is NEGATIVE SARS-CoV-2 target nucleic acids are NOT DETECTED. The SARS-CoV-2 RNA is generally detectable in upper and lower  respiratory specimens during the acute phase of infection. The lowest  concentration of SARS-CoV-2 viral copies this assay can detect is 250  copies / mL. A negative result does not preclude SARS-CoV-2 infection   and should not be used as the sole basis for treatment or other  patient management decisions.  A negative result may occur with  improper specimen collection / handling, submission of specimen other  than nasopharyngeal swab, presence of viral mutation(s) within the  areas targeted by this assay, and inadequate number of viral copies  (<250 copies / mL). A negative result must be combined with clinical  observations, patient history, and epidemiological information. If result is POSITIVE SARS-CoV-2 target nucleic acids are DETECTED. The SARS-CoV-2 RNA is generally detectable in upper and lower  respiratory specimens dur ing the acute phase of infection.  Positive  results are indicative of active infection with SARS-CoV-2.  Clinical  correlation with patient history and other diagnostic information is  necessary to determine patient infection status.  Positive results do  not rule out bacterial infection or co-infection with other viruses. If result is PRESUMPTIVE POSTIVE SARS-CoV-2 nucleic acids MAY BE PRESENT.   A presumptive positive result was obtained on the submitted specimen  and confirmed on repeat testing.  While 2019 novel coronavirus  (SARS-CoV-2) nucleic acids may be present in the submitted sample  additional confirmatory testing may be necessary for epidemiological  and / or clinical management purposes  to differentiate between  SARS-CoV-2 and other Sarbecovirus currently known to infect humans.  If clinically indicated additional testing with an alternate test  methodology [phone]) is advised. The SARS-CoV-2 RNA is generally  detectable in upper and lower respiratory sp ecimens during the acute  phase of infection. The expected result is Negative. Fact Sheet for Patients:  StrictlyIdeas.no Fact Sheet for Healthcare Providers: BankingDealers.co.za This test is not yet approved or cleared by the [place] FDA  and has been authorized for detection and/or diagnosis of SARS-CoV-2 by FDA under an  Emergency Use Authorization (EUA).  This EUA will remain in effect (meaning this test can be used) for the duration of the COVID-19 declaration under Section 564(b)(1) of the Act, 21 U.S.C. section 360bbb-3(b)(1), unless the authorization is terminated or revoked sooner. Performed at [facility], [place] [address], [place], [place] [address]   POC occult blood, ED Provider will collect     Status: Abnormal   Collection Time: 10/05/18  3:01 PM  Result Value Ref Range   Fecal Occult Bld POSITIVE (A) NEGATIVE  Type and screen [place]     Status: None (Preliminary result)   Collection Time: 10/05/18  3:36 PM  Result Value Ref Range   ABO/RH(D) O POS    Antibody Screen NEG    Sample Expiration 10/08/2018,2359    Unit Number [id number]    Blood Component Type RED CELLS,LR    Unit division 00    Status of Unit ALLOCATED    Transfusion Status OK TO TRANSFUSE    Crossmatch Result      Compatible Performed at [facility], [place] [address], [place], [place] [address]   Protime-INR     Status: Abnormal   Collection Time: 10/05/18  4:20 PM  Result Value Ref Range   Prothrombin Time 15.4 (H) 11.4 - 15.2 seconds   INR 1.2 0.8 - 1.2    Comment: (NOTE) INR goal varies based on device and disease states. Performed at [facility], [place] [address], [place], [place] [address]   Prepare RBC     Status: None   Collection Time: 10/05/18  5:00 PM  Result Value Ref Range   Order Confirmation      ORDER PROCESSED BY BLOOD BANK Performed at [facility], [place] [address] [place], [place] [address]    Dg Chest Portable 1 View  Result Date: 10/05/2018 CLINICAL DATA:  Shortness of breath.  Hypotension. EXAM: PORTABLE CHEST 1 VIEW COMPARISON:  Chest x-ray dated September 15, 2018. FINDINGS: The patient is rotated to the right. Interval removal of the left internal jugular  tunnel dialysis catheter with placement of a new right internal jugular tunnel dialysis catheter with tip in the proximal right atrium. Stable mild cardiomegaly. Normal mediastinal contours. Normal pulmonary vascularity. No focal consolidation, pleural effusion, or pneumothorax. No acute osseous abnormality. IMPRESSION: No active disease. Electronically Signed   By: Titus Dubin M.D.   On: 10/05/2018 14:39    Pending Labs Unresulted Labs (From admission, onward)    Start     Ordered   10/05/18 1710  Comprehensive metabolic panel  ONCE - STAT,   STAT     10/05/18 1709   10/05/18 1710  Lipase, blood  ONCE - STAT,   STAT     10/05/18 1709          Vitals/Pain Today's Vitals   10/05/18 1500 10/05/18 1515 10/05/18 1637 10/05/18 1659  BP: 93/73  (!) 89/70 100/71  Pulse:   99 100  Resp: (!) 6 (!) 22 19 17   Temp:   98.3 F (36.8 C) 98.5 F (36.9 C)  TempSrc:   Oral Oral  SpO2:   100% 100%  Weight:      Height:      PainSc:        Isolation Precautions No active isolations  Medications Medications  pantoprazole (PROTONIX) 80 mg in sodium chloride 0.9 % 100 mL IVPB (has no administration in time range)  0.9 %  sodium  chloride infusion (has no administration in time range)  dextrose 50 % solution 50 mL (50 mLs Intravenous Given 10/05/18 1403)    Mobility walks with device High fall risk   Focused Assessments R Recommendations: See Admitting Provider Note  Report given to:   Additional Notes: hgb 5.6 admit dr aware, consult to neph for dialysis today and they can give her blood , CMET is hemolized again and consult has been placed for IV team for 2 nd IV and lab draw, pt has iv but cannot draw back from it

## 2018-10-05 NOTE — ED Notes (Signed)
CMP and lipase needs recollected. Specimen clotted.

## 2018-10-05 NOTE — ED Triage Notes (Signed)
Pt arrives via EMS from home with reports of rectal bleeding. Pt due for HD today, last HD Tuesday. Endorses chills and sweating. Pt hypotensive

## 2018-10-05 NOTE — ED Notes (Signed)
Lab [facility] was able to get all labs except blue top,

## 2018-10-05 NOTE — ED Provider Notes (Signed)
Emergency Department Provider Note   I have reviewed the triage vital signs and the nursing notes.   HISTORY  Chief Complaint GI Bleeding   HPI Karen Morrow is a 58 y.o. female with PMH of ESRD, GERD, Asthma, and CHF presents to the emergency department for evaluation of return of GI bleeding with shortness of breath and lightheadedness.  Patient began noticing black and dark red material in her bowel movement yesterday.  Symptoms have continued overnight and early today.  She describes passage of blood only with going to have bowel movements.  No bleeding in between going to the bathroom.  She is seeing blood with wiping.  Patient is not anticoagulated.  She was discharged on 5/3 after GI bleed admission.  Thought to be secondary to peptic ulcer disease.  Patient did receive transfusion at that time and improved.  Patient also admitted in late April for bacteremia.  She completed a course of cefepime on 5/4.  She denies any fevers, chills, chest pain, abdominal pain, vomiting.   Past Medical History:  Diagnosis Date  . Anginal pain ([place])   . Asthma   . CHF (congestive heart failure) ([place])   . Chronic kidney disease    dialysis 3x wk  . Dyspnea   . Frequent bowel movements   . GERD (gastroesophageal reflux disease)   . Hepatitis C antibody test positive   . Hypertension    "just dx'd today" (11/03/2012)    Patient Active Problem List   Diagnosis Date Noted  . Gastrointestinal bleeding 09/29/2018  . GI bleed 09/29/2018  . Chronic hepatitis C ([place]) 09/23/2018  . Symptomatic anemia 09/16/2018  . Severe systemic inflammatory response syndrome (SIRS) ([place]) 09/16/2018  . Altered mental status 09/16/2018  . Tobacco abuse 09/16/2018  . Chronic diastolic (congestive) heart failure ([place]) 07/07/2018  . Aortic insufficiency 07/07/2018  . Hypertensive urgency 05/13/2018  . Infection of arteriovenous fistula ([place]) 04/25/2018  . MSSA bacteremia 04/25/2018  . Bacteremia   .  Staphylococcal sepsis ([place])   . Sepsis ([place]) 03/10/2018  . Hyperkalemia   . Acute respiratory distress   . Pulmonary hypertension due to left ventricular diastolic dysfunction ([place]) 11/08/2016  . Chronic cough 10/15/2016  . Acute GI bleeding 09/07/2016  . Gastroesophageal reflux disease 04/16/2016  . Anemia in chronic kidney disease, on chronic dialysis ([place]) 04/16/2016  . Primary insomnia 04/16/2016  . Shortness of breath 04/16/2016  . Pruritus 12/11/2014  . Hordeolum externum (stye) 12/11/2014  . Xanthelasma of right upper eyelid 12/11/2014  . Dermatitis 10/30/2014  . ESRD (end stage renal disease) ([place]) 02/25/2014  . Malnutrition of moderate degree ([place]) 11/28/2013  . HTN (hypertension) 11/26/2013  . Gastric ulcer 06/07/2013  . Abdominal pain 11/03/2012  . Chest pain 11/03/2012  . Thrombocytopenia ([place]) 11/03/2012  . Elevated transaminase level 11/03/2012  . 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 Hepatitis C antibody test positive     Past Surgical History:  Procedure Laterality Date  . ABDOMINAL HYSTERECTOMY     "partial" (11/03/2012)  . AV FISTULA PLACEMENT Right 12/03/2013   Procedure: CREATION OF ARTERIOVENOUS (AV) FISTULA RIGHT ARM ;  Surgeon: Rosetta Posner, MD;  Location: [facility];  Service: Vascular;  Laterality: Right;  . AV FISTULA PLACEMENT Left 05/17/2018   Procedure: CREATION OF BRACHIOCEPHALIC FISTULA LEFT ARM;  Surgeon: Waynetta Sandy, MD;  Location: [facility] [facility];  Service: Vascular;  Laterality: Left;  . [place] REMOVAL Right 03/16/2018  Procedure: REMOVAL OF ARTERIOVENOUS GORETEX GRAFT ([place]) RIGHT ARM;  Surgeon: Waynetta Sandy, MD;  Location: [place];  Service: Vascular;  Laterality: Right;  . BASCILIC VEIN TRANSPOSITION Right 03/06/2014   Procedure: SECOND STAGE BASILIC VEIN TRANSPOSITION;  Surgeon: Rosetta Posner, MD;  Location: [facility];  Service: Vascular;  Laterality: Right;  . BIOPSY  09/30/2018   Procedure:  BIOPSY;  Surgeon: Mauri Pole, MD;  Location: [facility];  Service: Endoscopy;;  . ESOPHAGOGASTRODUODENOSCOPY N/A 06/05/2013   Procedure: ESOPHAGOGASTRODUODENOSCOPY (EGD);  Surgeon: Winfield Cunas., MD;  Location: [facility];  Service: Endoscopy;  Laterality: N/A;  . ESOPHAGOGASTRODUODENOSCOPY N/A 09/08/2016   Procedure: ESOPHAGOGASTRODUODENOSCOPY (EGD);  Surgeon: Milus Banister, MD;  Location: [facility];  Service: Endoscopy;  Laterality: N/A;  . ESOPHAGOGASTRODUODENOSCOPY (EGD) WITH PROPOFOL N/A 09/17/2018   Procedure: ESOPHAGOGASTRODUODENOSCOPY (EGD) WITH PROPOFOL;  Surgeon: Carol Ada, MD;  Location: [facility];  Service: Endoscopy;  Laterality: N/A;  . ESOPHAGOGASTRODUODENOSCOPY (EGD) WITH PROPOFOL N/A 09/30/2018   Procedure: ESOPHAGOGASTRODUODENOSCOPY (EGD) WITH PROPOFOL;  Surgeon: Mauri Pole, MD;  Location: [facility];  Service: Endoscopy;  Laterality: N/A;  . INSERTION OF DIALYSIS CATHETER Left 03/16/2018   Procedure: PLACEMENT OF TUNNEL DIALYSIS CATHETER;  Surgeon: Waynetta Sandy, MD;  Location: [facility];  Service: Vascular;  Laterality: Left;  . INSERTION OF DIALYSIS CATHETER Right 09/26/2018   Procedure: INSERTION OF DIALYSIS  TUNNELED CATHETER;  Surgeon: Elam Dutch, MD;  Location: [facility] OR;  Service: Vascular;  Laterality: Right;  . IR REMOVAL TUN CV CATH W/O FL  09/17/2018  . REVISON OF ARTERIOVENOUS FISTULA Right 01/28/2017   Procedure: REVISON OF RIGHT ARM ARTERIOVENOUS FISTULA USING 8MMX10CM GORETEX GRAFT;  Surgeon: Angelia Mould, MD;  Location: [place];  Service: Vascular;  Laterality: Right;    Allergies Cefazolin  Family History  Problem Relation Age of Onset  . Lupus Mother   . Diabetes Father   . Heart attack Father     Social History Social History   Tobacco Use  . Smoking status: Light Tobacco Smoker    Packs/day: 0.50    Years: 20.00    Pack years: 10.00    Types: Cigarettes  . Smokeless tobacco: Never Used   Substance Use Topics  . Alcohol use: Not Currently    Alcohol/week: 1.0 standard drinks    Types: 1 Glasses of wine per week  . Drug use: Not Currently    Types: Marijuana    Review of Systems  Constitutional: No fever/chills. Positive weakness.  Eyes: No visual changes. ENT: No sore throat. Cardiovascular: Denies chest pain. Respiratory: Positive shortness of breath. Gastrointestinal: No abdominal pain.  No nausea, no vomiting.  No diarrhea.  No constipation. Positive black/dark red GI bleeding.  Genitourinary: Negative for dysuria. Musculoskeletal: Negative for back pain. Skin: Negative for rash. Neurological: Negative for headaches, focal weakness or numbness.  10-point ROS otherwise negative.  ____________________________________________   PHYSICAL EXAM:  VITAL SIGNS: ED Triage Vitals  Enc Vitals Group     BP 10/05/18 1346 (!) 84/68     Pulse Rate 10/05/18 1346 97     Resp 10/05/18 1346 16     Temp 10/05/18 1346 97.8 F (36.6 C)     Temp Source 10/05/18 1346 Oral     SpO2 10/05/18 1340 98 %     Weight 10/05/18 1345 115 lb 1.3 oz (52.2 kg)     Height 10/05/18 1345 5\' 7"  (1.702 m)   Constitutional: Alert and oriented. Well appearing  and in no acute distress. Eyes: Conjunctivae are normal.  Head: Atraumatic. Nose: No congestion/rhinnorhea. Mouth/Throat: Mucous membranes are moist. Neck: No stridor.   Cardiovascular: Normal rate, regular rhythm. Good peripheral circulation. Grossly normal heart sounds. Left subclavian HD access without surround cellulitis or ecchymosis.  Respiratory: Normal respiratory effort.  No retractions. Lungs CTAB. Gastrointestinal: Soft and nontender. No distention. Melena on DRE. No fissures or hemorrhoids. Chaperone present for exam and verbal consent obtained.   Musculoskeletal: No lower extremity tenderness nor edema. No gross deformities of extremities. Neurologic:  Normal speech and language. No gross focal neurologic deficits are  appreciated.  Skin:  Skin is warm, dry and intact. No rash noted.  ____________________________________________   LABS (all labs ordered are listed, but only abnormal results are displayed)  Labs Reviewed  CBC WITH DIFFERENTIAL/PLATELET - Abnormal; Notable for the following components:      Result Value   RBC 1.79 (*)    Hemoglobin 5.6 (*)    HCT 18.8 (*)    MCV 105.0 (*)    MCHC 29.8 (*)    RDW 21.0 (*)    Platelets 111 (*)    All other components within normal limits  TROPONIN I - Abnormal; Notable for the following components:   Troponin I 0.04 (*)    All other components within normal limits  PROTIME-INR - Abnormal; Notable for the following components:   Prothrombin Time 15.4 (*)    All other components within normal limits  CBG MONITORING, ED - Abnormal; Notable for the following components:   Glucose-Capillary 55 (*)    All other components within normal limits  CBG MONITORING, ED - Abnormal; Notable for the following components:   Glucose-Capillary 141 (*)    All other components within normal limits  POC OCCULT BLOOD, ED - Abnormal; Notable for the following components:   Fecal Occult Bld POSITIVE (*)    All other components within normal limits  SARS CORONAVIRUS 2 (HOSPITAL ORDER, [place] [facility])  COMPREHENSIVE METABOLIC PANEL  LIPASE, BLOOD  TYPE AND SCREEN  PREPARE RBC (CROSSMATCH)   ____________________________________________  EKG   EKG Interpretation  Date/Time:  Thursday Oct 05 2018 14:47:35 EDT Ventricular Rate:  102 PR Interval:  160 QRS Duration: 80 QT Interval:  364 QTC Calculation: 474 R Axis:   0 Text Interpretation:  Sinus tachycardia Left ventricular hypertrophy with repolarization abnormality Abnormal ECG No STEMI.  Confirmed by Nanda Quinton [phone]) on 10/05/2018 3:07:41 PM       ____________________________________________  RADIOLOGY  Dg Chest Portable 1 View  Result Date: 10/05/2018 CLINICAL DATA:   Shortness of breath.  Hypotension. EXAM: PORTABLE CHEST 1 VIEW COMPARISON:  Chest x-ray dated September 15, 2018. FINDINGS: The patient is rotated to the right. Interval removal of the left internal jugular tunnel dialysis catheter with placement of a new right internal jugular tunnel dialysis catheter with tip in the proximal right atrium. Stable mild cardiomegaly. Normal mediastinal contours. Normal pulmonary vascularity. No focal consolidation, pleural effusion, or pneumothorax. No acute osseous abnormality. IMPRESSION: No active disease. Electronically Signed   By: Titus Dubin M.D.   On: 10/05/2018 14:39    ____________________________________________   PROCEDURES  Procedure(s) performed:   Procedures  CRITICAL CARE Performed by: Margette Fast Total critical care time: 35 minutes Critical care time was exclusive of separately billable procedures and treating other patients. Critical care was necessary to treat or prevent imminent or life-threatening deterioration. Critical care was time spent personally by me on  the following activities: development of treatment plan with patient and/or surrogate as well as nursing, discussions with consultants, evaluation of patient's response to treatment, examination of patient, obtaining history from patient or surrogate, ordering and performing treatments and interventions, ordering and review of laboratory studies, ordering and review of radiographic studies, pulse oximetry and re-evaluation of patient's condition.  Nanda Quinton, MD Emergency Medicine  ____________________________________________   INITIAL IMPRESSION / ASSESSMENT AND PLAN / ED COURSE  Pertinent labs & imaging results that were available during my care of the patient were reviewed by me and considered in my medical decision making (see chart for details).   Patient returns to the emergency department with concern for GI bleeding.  Initial blood pressure in the 80s.  No  tachycardia.  Patient does have congestive heart failure as well as end-stage renal disease.  She does not appear volume overloaded.  She was unable to go to dialysis today due to symptoms.  Melena on exam.  Patient awake and alert.  He is able to provide a full history.  No fevers or concern for hypotension secondary to sepsis or cardiogenic shock.  Hb has down-trended again to < 6.0. Ordered 2U PRBC to run slow as patient is ESRD. No worsening hypotension and vitals near patient's baseline. Spoke with  GI who will consul. Clear liquid diet placed for now and NPO after midnight. They may EGD tomorrow if possible. CXR reviewed.  SARS-CoV-2 negative.  Discussed patient's case with Hospitalist to request admission. Patient and family (if present) updated with plan. Care transferred to Hospitalist service.  I reviewed all nursing notes, vitals, pertinent old records, EKGs, labs, imaging (as available).  ____________________________________________  FINAL CLINICAL IMPRESSION(S) / ED DIAGNOSES  Final diagnoses:  Gastrointestinal hemorrhage, unspecified gastrointestinal hemorrhage type     MEDICATIONS GIVEN DURING THIS VISIT:  Medications  pantoprazole (PROTONIX) 80 mg in sodium chloride 0.9 % 100 mL IVPB (has no administration in time range)  0.9 %  sodium chloride infusion (has no administration in time range)  dextrose 50 % solution 50 mL (50 mLs Intravenous Given 10/05/18 1403)    Note:  This document was prepared using Dragon voice recognition software and may include unintentional dictation errors.  Nanda Quinton, MD Emergency Medicine    Long, Wonda Old[facility], MD 10/05/18 1710

## 2018-10-05 NOTE — Progress Notes (Signed)
NEW ADMISSION NOTE New Admission Note:   Arrival Method: Stretcher Mental Orientation: A&O X4 Telemetry: Initiated Assessment: Completed Skin: WDL IV: WDL Pain: Denies Safety Measures: Safety Fall Prevention Plan has been given, discussed and signed 5 [place] Orientation: Patient has been orientated to the room, unit and staff.   Orders have been reviewed and implemented. Will continue to monitor the patient. Call light has been placed within reach and bed alarm has been activated.   Aneta Mins BSN, RN3

## 2018-10-05 NOTE — Consult Note (Signed)
[facility] Renal Consultation Note  Requesting MD: Karleen Hampshire  Indication for Consultation:   ESRD  Chief complaint: black stools  HPI:  Karen Morrow is a 58 y.o. female with a history of end-stage renal disease on hemodialysis Tuesday Thursday Saturday who presented to the ER with complaint of black tarry stools which she states began this morning.  Patient states that she has had an admission last week with similar complaints.  Spoke with primary team and the patient is scheduled for EGD tomorrow.  She missed HD earlier today.  She states she feels as though she does not have much fluid on.  She does not currently have any shortness of breath.  She reports nausea but denies any vomiting.  She states that she has been dialyzing through a right chest tunneled catheter as her left AV fistula infiltrated and they are currently resting this.  COVID-19 negative.  Her EGD on 5/2 was notable for bleeding gastric ulcer, erosive nodular gastritis and reflux esophagitis.  She was ordered to receive two units - has not yet received.  Her labs are hemolyzed.   PMHx:   Past Medical History:  Diagnosis Date  . Anginal pain ([place])   . Asthma   . CHF (congestive heart failure) ([place])   . Chronic kidney disease    dialysis 3x wk  . Dyspnea   . Frequent bowel movements   . GERD (gastroesophageal reflux disease)   . Hepatitis C antibody test positive   . Hypertension    "just dx'd today" (11/03/2012)    Past Surgical History:  Procedure Laterality Date  . ABDOMINAL HYSTERECTOMY     "partial" (11/03/2012)  . AV FISTULA PLACEMENT Right 12/03/2013   Procedure: CREATION OF ARTERIOVENOUS (AV) FISTULA RIGHT ARM ;  Surgeon: Rosetta Posner, MD;  Location: [place];  Service: Vascular;  Laterality: Right;  . AV FISTULA PLACEMENT Left 05/17/2018   Procedure: CREATION OF BRACHIOCEPHALIC FISTULA LEFT ARM;  Surgeon: Waynetta Sandy, MD;  Location: [place];  Service: Vascular;  Laterality: Left;  .  [place] REMOVAL Right 03/16/2018   Procedure: REMOVAL OF ARTERIOVENOUS GORETEX GRAFT ([place]) RIGHT ARM;  Surgeon: Waynetta Sandy, MD;  Location: [place];  Service: Vascular;  Laterality: Right;  . BASCILIC VEIN TRANSPOSITION Right 03/06/2014   Procedure: SECOND STAGE BASILIC VEIN TRANSPOSITION;  Surgeon: Rosetta Posner, MD;  Location: [place];  Service: Vascular;  Laterality: Right;  . BIOPSY  09/30/2018   Procedure: BIOPSY;  Surgeon: Mauri Pole, MD;  Location: [facility];  Service: Endoscopy;;  . ESOPHAGOGASTRODUODENOSCOPY N/A 06/05/2013   Procedure: ESOPHAGOGASTRODUODENOSCOPY (EGD);  Surgeon: Winfield Cunas., MD;  [facility];  Service: Endoscopy;  Laterality: N/A;  . ESOPHAGOGASTRODUODENOSCOPY N/A 09/08/2016   Procedure: ESOPHAGOGASTRODUODENOSCOPY (EGD);  Surgeon: Milus Banister, MD;  Location: [place];  Service: Endoscopy;  Laterality: N/A;  . ESOPHAGOGASTRODUODENOSCOPY (EGD) WITH PROPOFOL N/A 09/17/2018   Procedure: ESOPHAGOGASTRODUODENOSCOPY (EGD) WITH PROPOFOL;  Surgeon: Carol Ada, MD;  Location: [place];  Service: Endoscopy;  Laterality: N/A;  . ESOPHAGOGASTRODUODENOSCOPY (EGD) WITH PROPOFOL N/A 09/30/2018   Procedure: ESOPHAGOGASTRODUODENOSCOPY (EGD) WITH PROPOFOL;  Surgeon: Mauri Pole, MD;  Location: [facility];  Service: Endoscopy;  Laterality: N/A;  . INSERTION OF DIALYSIS CATHETER Left 03/16/2018   Procedure: PLACEMENT OF TUNNEL DIALYSIS CATHETER;  Surgeon: Waynetta Sandy, MD;  Location: [place];  Service: Vascular;  Laterality: Left;  . INSERTION OF DIALYSIS CATHETER Right 09/26/2018   Procedure: INSERTION OF DIALYSIS  TUNNELED CATHETER;  Surgeon: Elam Dutch, MD;  Location: [facility] OR;  Service: Vascular;  Laterality: Right;  . IR REMOVAL TUN CV CATH W/O FL  09/17/2018  . REVISON OF ARTERIOVENOUS FISTULA Right 01/28/2017   Procedure: REVISON OF RIGHT ARM ARTERIOVENOUS FISTULA USING 8MMX10CM GORETEX GRAFT;  Surgeon: Angelia Mould, MD;  Location: [facility] OR;  Service: Vascular;  Laterality: Right;    Family Hx:  Family History  Problem Relation Age of Onset  . Lupus Mother   . Diabetes Father   . Heart attack Father     Social History:  reports that she has been smoking cigarettes. She has a 10.00 pack-year smoking history. She has never used smokeless tobacco. She reports previous alcohol use of about 1.0 standard drinks of alcohol per week. She reports previous drug use. Drug: Marijuana.  Allergies:  Allergies  Allergen Reactions  . Cefazolin Other (See Comments)    Severe thrombocytopenia (has tolerated zosyn in the past) ID aware Tolerated in October 2019    Medications: Prior to Admission medications   Medication Sig Start Date End Date Taking? Authorizing Provider  acetaminophen (TYLENOL) 500 MG tablet Take 500-1,000 mg by mouth every 6 (six) hours as needed for headache (pain).    [provider]  albuterol (PROVENTIL HFA;VENTOLIN HFA) 108 (90 Base) MCG/ACT inhaler TAKE 2 PUFFS BY MOUTH EVERY 6 HOURS AS NEEDED FOR WHEEZE OR SHORTNESS OF BREATH Patient taking differently: Inhale 2 puffs into the lungs every 6 (six) hours as needed for wheezing.  05/09/18   [name], MD  albuterol (PROVENTIL) (2.5 MG/3ML) 0.083% nebulizer solution Take 3 mLs (2.5 mg total) by nebulization every 6 (six) hours as needed for wheezing or shortness of breath. MUST MAKE APPT FOR FURTHER REFILLS 08/15/18   [name], MD  Amino Acids-Protein Hydrolys (FEEDING SUPPLEMENT, PRO-STAT SUGAR FREE 64,) LIQD Take 30 mLs by mouth 2 (two) times daily. 09/19/18   Hosie Poisson, MD  amLODipine (NORVASC) 10 MG tablet Take 1 tablet (10 mg total) by mouth daily. Patient taking differently: Take 10 mg by mouth at bedtime.  05/16/18   Thurnell Lose, MD  b complex-vitamin c-folic acid (NEPHRO-VITE) 0.8 MG TABS tablet Take 1 tablet by mouth every evening. 07/27/18   [provider]  cinacalcet  (SENSIPAR) 30 MG tablet TAKE 1 TABLET BY MOUTH DAILY EVERY EVENING (DO NOT TAKE LESS THAN 12 HOURS PRIOR TO DIALYSIS) 09/19/18   Hosie Poisson, MD  ferric citrate (AURYXIA) 1 GM 210 MG(Fe) tablet Take 420 mg by mouth 3 (three) times daily with meals.    [provider]  metoprolol tartrate (LOPRESSOR) 25 MG tablet Take 25 mg by mouth 2 (two) times daily.    [provider]  Nutritional Supplements (FEEDING SUPPLEMENT, NEPRO CARB STEADY,) LIQD Take 237 mLs by mouth 2 (two) times daily between meals. 10/01/18   Mercy Riding, MD  oxyCODONE-acetaminophen (PERCOCET/ROXICET) 5-325 MG tablet Take 1 tablet by mouth every 6 (six) hours as needed for severe pain. 09/26/18   Elam Dutch, MD  pantoprazole (PROTONIX) 40 MG tablet Take 1 tablet (40 mg total) by mouth 2 (two) times daily. 10/01/18   Mercy Riding, MD  sucralfate (CARAFATE) 1 g tablet Take 1 tablet (1 g total) by mouth 4 (four) times daily -  with meals and at bedtime. 10/01/18   Mercy Riding, MD    I have reviewed the patient's current medications.  Labs:  BMP Latest Ref Rng &  Units 10/01/2018 09/30/2018 09/29/2018  Glucose 70 - 99 mg/dL 74 78 77  BUN 6 - 20 mg/dL 21(H) 74(H) 61(H)  Creatinine 0.44 - 1.00 mg/dL 4.51(H) 8.81(H) 7.85(H)  BUN/Creat Ratio 9 - 23 - - -  Sodium 135 - 145 mmol/L 135 137 135  Potassium 3.5 - 5.1 mmol/L 3.0(L) 4.6 5.2(H)  Chloride 98 - 111 mmol/L 97(L) 100 97(L)  CO2 22 - 32 mmol/L 25 21(L) 21(L)  Calcium 8.9 - 10.3 mg/dL 8.0(L) 8.6(L) 8.9    ROS:  Pertinent items noted in HPI and remainder of comprehensive ROS otherwise negative.  Physical Exam: Vitals:   10/05/18 1715 10/05/18 1730  BP: 99/75 93/65  Pulse:  (!) 105  Resp: (!) 22 20  Temp:    SpO2:  100%     General: Adult female in bed in no acute distress at rest HEENT: Normocephalic atraumatic Eyes: Extraocular movements intact sclera anicteric Neck: Supple trachea midline no JVD Heart: S1-S2 no rub appreciated Lungs: Clear to  auscultation bilaterally normal work of breathing at rest Abdomen: Soft nontender nondistended  Extremities: No pitting edema no cyanosis or clubbing Skin: No rash on extremities exposed Neuro: Alert and oriented x3 and conversant follows commands Access:  Right chest tunneled catheter; left AVF   Assessment/Plan:  # End-stage renal disease - Hemodialysis tonight and per TTS schedule - Via tunneled catheter.  We are not using heparin with HD  # Acute blood loss anemia - Secondary to GI bleed - Transfusion of 2 units of packed red blood cells with HD.  If delay in HD ok to transfuse 1st unit off of dialysis.  Orders are in and unit is aware   # GI bleed - Plans for EGD tomorrow - PPI   # Cirrhosis  - with hep C   # Hx of MSSA cath sepsis  - Noted.  Pt reports resting LUE AVF due to infiltration  - she is s/p cefepime with HD through 5/4 per charting   # hx HTN - Note that her metoprolol and amlodipine are to be held   Claudia Desanctis 10/05/2018, 6:13 PM

## 2018-10-06 ENCOUNTER — Encounter (HOSPITAL_COMMUNITY)
Admission: EM | Disposition: A | Discharge status: Home / Self Care | Payer: Self-pay | Source: Home / Self Care | Attending: Internal Medicine

## 2018-10-06 ENCOUNTER — Inpatient Hospital Stay (HOSPITAL_COMMUNITY): Payer: Medicare Other | Admitting: Anesthesiology

## 2018-10-06 ENCOUNTER — Encounter (HOSPITAL_COMMUNITY): Payer: Self-pay

## 2018-10-06 DIAGNOSIS — B182 Chronic viral hepatitis C: Secondary | ICD-10-CM

## 2018-10-06 DIAGNOSIS — K222 Esophageal obstruction: Secondary | ICD-10-CM

## 2018-10-06 DIAGNOSIS — D649 Anemia, unspecified: Secondary | ICD-10-CM

## 2018-10-06 DIAGNOSIS — K449 Diaphragmatic hernia without obstruction or gangrene: Secondary | ICD-10-CM

## 2018-10-06 DIAGNOSIS — R109 Unspecified abdominal pain: Secondary | ICD-10-CM

## 2018-10-06 DIAGNOSIS — K259 Gastric ulcer, unspecified as acute or chronic, without hemorrhage or perforation: Secondary | ICD-10-CM

## 2018-10-06 DIAGNOSIS — I5032 Chronic diastolic (congestive) heart failure: Secondary | ICD-10-CM

## 2018-10-06 DIAGNOSIS — I10 Essential (primary) hypertension: Secondary | ICD-10-CM

## 2018-10-06 DIAGNOSIS — D696 Thrombocytopenia, unspecified: Secondary | ICD-10-CM

## 2018-10-06 DIAGNOSIS — K7460 Unspecified cirrhosis of liver: Secondary | ICD-10-CM

## 2018-10-06 DIAGNOSIS — K277 Chronic peptic ulcer, site unspecified, without hemorrhage or perforation: Secondary | ICD-10-CM

## 2018-10-06 HISTORY — PX: ENTEROSCOPY: SHX5533

## 2018-10-06 LAB — TYPE AND SCREEN
ABO/RH(D): O POS
Antibody Screen: NEGATIVE
Unit division: 0
Unit division: 0

## 2018-10-06 LAB — CBC
HCT: 23 % — ABNORMAL LOW (ref 36.0–46.0)
HCT: 23.9 % — ABNORMAL LOW (ref 36.0–46.0)
HCT: 26.2 % — ABNORMAL LOW (ref 36.0–46.0)
Hemoglobin: 7.9 g/dL — ABNORMAL LOW (ref 12.0–15.0)
Hemoglobin: 8.3 g/dL — ABNORMAL LOW (ref 12.0–15.0)
Hemoglobin: 9.1 g/dL — ABNORMAL LOW (ref 12.0–15.0)
MCH: 31.4 pg (ref 26.0–34.0)
MCH: 31.6 pg (ref 26.0–34.0)
MCH: 31.7 pg (ref 26.0–34.0)
MCHC: 34.3 g/dL (ref 30.0–36.0)
MCHC: 34.7 g/dL (ref 30.0–36.0)
MCHC: 34.7 g/dL (ref 30.0–36.0)
MCV: 90.5 fL (ref 80.0–100.0)
MCV: 91.3 fL (ref 80.0–100.0)
MCV: 92 fL (ref 80.0–100.0)
Platelets: 86 10*3/uL — ABNORMAL LOW (ref 150–400)
Platelets: 95 10*3/uL — ABNORMAL LOW (ref 150–400)
Platelets: 97 10*3/uL — ABNORMAL LOW (ref 150–400)
RBC: 2.5 MIL/uL — ABNORMAL LOW (ref 3.87–5.11)
RBC: 2.64 MIL/uL — ABNORMAL LOW (ref 3.87–5.11)
RBC: 2.87 MIL/uL — ABNORMAL LOW (ref 3.87–5.11)
RDW: 16.3 % — ABNORMAL HIGH (ref 11.5–15.5)
RDW: 17.7 % — ABNORMAL HIGH (ref 11.5–15.5)
RDW: 18.4 % — ABNORMAL HIGH (ref 11.5–15.5)
WBC: 6.1 10*3/uL (ref 4.0–10.5)
WBC: 6.7 10*3/uL (ref 4.0–10.5)
WBC: 7.7 10*3/uL (ref 4.0–10.5)
nRBC: 0.3 % — ABNORMAL HIGH (ref 0.0–0.2)
nRBC: 0.4 % — ABNORMAL HIGH (ref 0.0–0.2)
nRBC: 0.7 % — ABNORMAL HIGH (ref 0.0–0.2)

## 2018-10-06 LAB — BPAM RBC
Blood Product Expiration Date: 202005222359
Blood Product Expiration Date: 202005222359
ISSUE DATE / TIME: 202005072024
ISSUE DATE / TIME: 202005072112
Unit Type and Rh: 5100
Unit Type and Rh: 5100

## 2018-10-06 LAB — MRSA PCR SCREENING: MRSA by PCR: NEGATIVE

## 2018-10-06 SURGERY — ENTEROSCOPY
Anesthesia: Monitor Anesthesia Care

## 2018-10-06 MED ORDER — PROPOFOL 10 MG/ML IV BOLUS
INTRAVENOUS | Status: DC | PRN
  Administered 2018-10-06 (×3): 30 mg via INTRAVENOUS

## 2018-10-06 MED ORDER — DARBEPOETIN ALFA 60 MCG/0.3ML IJ SOSY
60.0000 ug | PREFILLED_SYRINGE | INTRAMUSCULAR | Status: DC
  Administered 2018-10-07: 60 ug via INTRAVENOUS
  Filled 2018-10-06: qty 0.3

## 2018-10-06 MED ORDER — PANTOPRAZOLE SODIUM 40 MG PO TBEC
40.0000 mg | DELAYED_RELEASE_TABLET | Freq: Two times a day (BID) | ORAL | Status: DC
  Administered 2018-10-06 – 2018-10-07 (×2): 40 mg via ORAL
  Filled 2018-10-06 (×2): qty 1

## 2018-10-06 MED ORDER — DOXERCALCIFEROL 4 MCG/2ML IV SOLN
3.0000 ug | INTRAVENOUS | Status: DC
  Administered 2018-10-07: 3 ug via INTRAVENOUS
  Filled 2018-10-06: qty 2

## 2018-10-06 MED ORDER — SODIUM CHLORIDE 0.9 % IV SOLN
INTRAVENOUS | Status: DC
  Administered 2018-10-06: 13:00:00 via INTRAVENOUS

## 2018-10-06 MED ORDER — PROPOFOL 500 MG/50ML IV EMUL
INTRAVENOUS | Status: DC | PRN
  Administered 2018-10-06: 100 ug/kg/min via INTRAVENOUS

## 2018-10-06 MED ORDER — PANTOPRAZOLE SODIUM 40 MG IV SOLR
40.0000 mg | Freq: Two times a day (BID) | INTRAVENOUS | Status: DC
  Administered 2018-10-06: 40 mg via INTRAVENOUS
  Filled 2018-10-06: qty 40

## 2018-10-06 NOTE — Anesthesia Procedure Notes (Signed)
Procedure Name: MAC Date/Time: 10/06/2018 2:15 PM Performed by: Renato Shin, CRNA Pre-anesthesia Checklist: Patient identified, Emergency Drugs available, Suction available and Patient being monitored Patient Re-evaluated:Patient Re-evaluated prior to induction Oxygen Delivery Method: Nasal cannula Preoxygenation: Pre-oxygenation with 100% oxygen Induction Type: IV induction Placement Confirmation: positive ETCO2 and breath sounds checked- equal and bilateral Dental Injury: Teeth and Oropharynx as per pre-operative assessment

## 2018-10-06 NOTE — Transfer of Care (Signed)
Immediate Anesthesia Transfer of Care Note  Patient: Karen Morrow  Procedure(s) Performed: ENTEROSCOPY (N/A )  Patient Location: Endoscopy Unit  Anesthesia Type:MAC  Level of Consciousness: awake, drowsy and patient cooperative  Airway & Oxygen Therapy: Patient Spontanous Breathing and Patient connected to nasal cannula oxygen  Post-op Assessment: Report given to RN and Post -op Vital signs reviewed and stable  Post vital signs: Reviewed and stable  Last Vitals:  Vitals Value Taken Time  BP    Temp    Pulse    Resp    SpO2      Last Pain:  Vitals:   10/06/18 1230  TempSrc: Oral  PainSc: 0-No pain         Complications: No apparent anesthesia complications

## 2018-10-06 NOTE — Progress Notes (Signed)
Initial Nutrition Assessment   RD working remotely.   DOCUMENTATION CODES:   Underweight  INTERVENTION:   Recommend liberalized diet once advanced  Continue Rena-Vit  Continue Nepro Shake po BID, each supplement provides 425 kcal and 19 grams protein  Continue Pro-Stat 30 mL BID  NUTRITION DIAGNOSIS:   Inadequate oral intake related to acute illness as evidenced by NPO status.  GOAL:   Patient will meet greater than or equal to 90% of their needs  MONITOR:   Diet advancement, PO intake, Supplement acceptance, Labs, Weight trends  REASON FOR ASSESSMENT:   Malnutrition Screening Tool    ASSESSMENT:    58 yo female admitted with acute GI bleed and blood loss anemia likely related to bleeding gastric ulcer. EGD on 5/2 notable for bleeding gastric ulcer, erosive nodular gastritis and reflux esophagitis. PMH includes ESRD on HD, cirrhosis, choronic hep C  EGD today, NPO at present  EDW 52.5 kg; current wt 51.9 kg  +blood in stool this AM  No phosphorus yet this admission; noted phosphorus 1.9 on 09/19/18, 2.7 09/15/18. Noted no phosphorus binder ordered at present  Labs: potassium 3.3 (L), corrected calcium 10.2, albumin 2.0 Meds: Rena-Vit, hectoral, Aranesp   NUTRITION - FOCUSED PHYSICAL EXAM:  Unable to assess, working remotely  Diet Order:   Diet Order            Diet NPO time specified  Diet effective midnight              EDUCATION NEEDS:   Not appropriate for education at this time  Skin:  Skin Assessment: Reviewed RN Assessment  Last BM:  no documented BM  Height:   Ht Readings from Last 1 Encounters:  10/05/18 5\' 7"  (1.702 m)    Weight:   Wt Readings from Last 1 Encounters:  10/05/18 51.9 kg    Ideal Body Weight:  61.4 kg  BMI:  Body mass index is 17.92 kg/m.  Estimated Nutritional Needs:   Kcal:  1850-2050 kcals   Protein:  90-105 g   Fluid:  1000 mL plus UOP    [employer] MS, RD, LDN, CNSC [phone] Pager   [phone] Weekend/On-Call Pager

## 2018-10-06 NOTE — Op Note (Signed)
[facility] Patient Name: Karen Morrow Procedure Date : 10/06/2018 MRN: [id number] Attending MD: Gerrit Heck , MD Date of Birth: [birth year] CSN: [id number] Age: 58 Admit Type: Inpatient Procedure:                Upper GI endoscopy Indications:              Acute post hemorrhagic anemia, Melena, Chronic                            peptic ulcer                           58 yo female with history of non-healing gastric                            ulcer noted on EGD in 05/2013, 08/2016, 08/2018, and                            again on 09/30/2018, readmitted with melena and                            anemia, with Hgb 5.6. Was recently admitted 5/1-3                            for the same, with Hgb nadir 5.8, requiring 3U                            pRBCs. EGD on 09/30/18 with 3 mm pre-pylori ulcer,                            clean based, and gastritis, with benign biopsies.                            Also with small hiatal hernia and LA Grade C                            esophagitis. Treated with high dose PPI, discharged                            with Hgb 7.9, now readmitted. Transfused 2U pRBCs                            overnight. Providers:                Gerrit Heck, MD, Raynelle Bring, RN, Grace Isaac, RN, Elspeth Cho Tech., Technician, Ladona Ridgel, Technician Referring MD:              Medicines:                Monitored Anesthesia Care Complications:  No immediate complications. Estimated Blood Loss:     Estimated blood loss: none. Procedure:                Pre-Anesthesia Assessment:                           - Prior to the procedure, a History and Physical                            was performed, and patient medications and                            allergies were reviewed. The patient's tolerance of                            previous anesthesia was also reviewed. The risks          and benefits of the procedure and the sedation                            options and risks were discussed with the patient.                            All questions were answered, and informed consent                            was obtained. Prior Anticoagulants: The patient has                            taken no previous anticoagulant or antiplatelet                            agents. ASA Grade Assessment: III - A patient with                            severe systemic disease. After reviewing the risks                            and benefits, the patient was deemed in                            satisfactory condition to undergo the procedure.                           After obtaining informed consent, the endoscope was                            passed under direct vision. Throughout the                            procedure, the patient's blood pressure, pulse, and                            oxygen saturations were monitored continuously. The  [id number]-[id number] ([id number] Olympus pediatric colonscope                            was introduced through the mouth, and advanced to                            the proximal jejunum. The upper GI endoscopy was                            accomplished without difficulty. The patient                            tolerated the procedure well. Scope In: Scope Out: Findings:      A non-obstructing Schatzki ring was found in the lower third of the       esophagus. Small hiatal hernia. The previously noted erosive esophagitis       has since healed.      The upper third of the esophagus and middle third of the esophagus were       normal.      One non-bleeding cratered gastric ulcer with no stigmata of bleeding was       found in the gastric antrum. The lesion was 3 mm in largest dimension.       This appeared relatively unchanged from last week, perhaps slightly       smaller in size. This has been biopsied multiple times  previously.      Mild non-ulcer gastritis. Otherwise, the gastric fundus and gastric body       were normal. This was previously biopsied.      The first portion of the duodenum, third portion of the duodenum and       fourth portion of the duodenum were normal.      Localized mildly erythematous mucosa without active bleeding and with no       stigmata of bleeding was found in the duodenal bulb.      A non-bleeding diverticulum was found in the second portion of the       duodenum.      The examined jejunum was normal. Impression:               - Non-obstructing Schatzki ring.                           - Normal upper third of esophagus and middle third                            of esophagus.                           - Non-bleeding gastric ulcer with no stigmata of                            bleeding.                           - Normal gastric fundus and gastric body.                           -  Normal first portion of the duodenum, third                            portion of the duodenum and fourth portion of the                            duodenum.                           - Erythematous duodenopathy.                           - Non-bleeding duodenal diverticulum.                           - Normal examined jejunum.                           - No specimens collected.                           - No blood noted, and no high grade stigmata of                            bleeding. Recommendation:           - Return patient to hospital ward for ongoing care.                           - Full liquid diet today.                           - Continue present medications.                           - Resume serial H/H checks with pRBC transfusions                            per protocol.                           - If ongoing bleeding, may need to consider either                            colonoscopy or VCE to rule out additional source of                            bleeding. Alternatively,  could consider tagged RBC                            scan.                           - If not previously done, recommend outpatient                            colonoscopy. Procedure Code(s):        ---  Professional ---                           [phone], Esophagogastroduodenoscopy, flexible,                            transoral; diagnostic, including collection of                            specimen(s) by brushing or washing, when performed                            (separate procedure) Diagnosis Code(s):        --- Professional ---                           K22.2, Esophageal obstruction                           K25.9, Gastric ulcer, unspecified as acute or                            chronic, without hemorrhage or perforation                           D62, Acute posthemorrhagic anemia                           K92.1, Melena (includes Hematochezia)                           K27.7, Chronic peptic ulcer, site unspecified,                            without hemorrhage or perforation                           K57.10, Diverticulosis of small intestine without                            perforation or abscess without bleeding CPT copyright 2019 American [employer] Association. All rights reserved. The codes documented in this report are preliminary and upon coder review may  be revised to meet current compliance requirements. Gerrit Heck, MD 10/06/2018 3:03:55 PM Number of Addenda: 0

## 2018-10-06 NOTE — Progress Notes (Signed)
PROGRESS NOTE  Karen Morrow MHD:[id number] DOB: Aug 09, 1960 DOA: 10/05/2018 PCP: [name], MD   LOS: 1 day   Brief narrative: Karen Morrow is a 58 y.o. female with medical history significant of end-stage renal disease on dialysis (on Tuesday, Thursday, Saturday), diastolic heart failure, hypertension, GERD, and recent history of MSSA bacteremia and sepsis, GI bleed secondary to reflux esophagitis and a gastric ulcer presented to the ED with symptomatic anemia with a hemoglobin of 5.6 and bloody and melanotic stools for one day.  Patient reported mild generalized abdominal pain associated with some nausea.   On arrival to emergency department patient was found to be afebrile, heart rate of 105/min, respiratory rate of 20/min and a blood pressure of 84/68.  Labs were significant  for a hemoglobin of 5.6, normal WBC count, platelets of 111, MCV of 105 hematocrit of 18, INR of 1.2. Stool for occult blood was positive, COVID-19 screen was negative.  Chest x-ray was negative.  Patient was started on IV Protonix and GI was consulted with plan for EGD 10/06/18.    Subjective: Patient stated that that she had a bowel movement and had some blood on wiping.  Denies abdominal pain.  Denies nausea vomiting, feels hungry.  Assessment/Plan:  Active Problems:   Abdominal pain   Thrombocytopenia (HCC)   Hepatic cirrhosis (HCC)   HTN (hypertension)   Chronic diastolic (congestive) heart failure (HCC)   Symptomatic anemia   Chronic hepatitis C (HCC)   GI bleed  Acute blood loss anemia likely secondary to upper GI bleed.  Hemoglobin of 5.6 in the ED yesterday.  Patient recently had an EGD on 09/30/2018 which showed non-bleeding gastric ulcer and esophagitis.  Continue on Protonix.  Patient received 2 units of packed RBC.  GI on board.  Currently n.p.o. for EGD.  Hemoglobin 9.1 after transfusion of packed RBC.  Continue to monitor CBC.   Thrombocytopenia. Likely secondary to chronic liver disease  and cirrhosis of liver.  Will closely monitor.  End-stage renal disease on dialysis. Nephrology on board for hemodialysis.  Received hemodialysis yesterday.  History of recent MSSA bacteremia. Completed the course of cefepime on 10/02/2018  Hypertension.  Continue to hold amlodipine and metoprolol for borderline blood pressures.  The morning blood pressure is more stable.  Chronic hepatitis C, cirrhosis of liver. Outpatient follow-up with PCP/infectious disease.  VTE Prophylaxis:  SCD   Code Status:  Full  Family Communication: None  Disposition Plan: Home, await GI intervention.  Currently n.p.o.  Likely DC in 1-2 days.   Consultants:  GI  Procedures:  Plan for EGD  Antibiotics: Anti-infectives (From admission, onward)   None     Objective: Vitals:   10/06/18 0519 10/06/18 0612  BP: 116/81 110/76  Pulse: (!) 101 99  Resp: 18   Temp: 98 F (36.7 C)   SpO2: 100% 100%    Intake/Output Summary (Last 24 hours) at 10/06/2018 0719 Last data filed at 10/06/2018 0519 Gross per 24 hour  Intake 630 ml  Output 1000 ml  Net -370 ml   Filed Weights   10/05/18 1920 10/05/18 2156 10/05/18 2315  Weight: 53.1 kg 51.9 kg 51.9 kg   Body mass index is 17.92 kg/m.   Physical Exam: GENERAL: Patient is alert awake and oriented. Not in obvious distress. HENT: Mild pallor noted.  Pupils equally reactive to light. Oral mucosa is moist NECK: is supple, no palpable thyroid enlargement. CHEST: Clear to auscultation. No crackles or wheezes. Non tender on palpation.  Diminished breath sounds bilaterally.  Right chest wall hemodialysis catheter noted. CVS: S1 and S2 heard, no murmur. Regular rate and rhythm. No pericardial rub. ABDOMEN: Soft, non-tender, bowel sounds are present. No palpable hepato-splenomegaly. EXTREMITIES: No edema. CNS: Cranial nerves are intact. No focal motor or sensory deficits. SKIN: warm and dry without rashes.   Data Review: I have personally reviewed the  following laboratory data and studies,  CBC: Recent Labs  Lab 09/29/18 1046  09/30/18 0208 09/30/18 1816 10/01/18 0244 10/05/18 1418 10/06/18 0025  WBC 14.9*  --  14.5* 11.6* 8.6 8.8 7.7  NEUTROABS 12.9*  --   --   --   --  6.7  --   HGB 5.8*   < > 6.6* 8.5* 7.9* 5.6* 9.1*  HCT 17.2*   < > 20.1* 24.4* 23.2* 18.8* 26.2*  MCV 92.5  --  90.1 88.4 89.9 105.0* 91.3  PLT 132*  --  124* 96* 82* 111* 86*   < > = values in this interval not displayed.   Basic Metabolic Panel: Recent Labs  Lab 09/29/18 1046 09/30/18 0203 10/01/18 0244 10/05/18 1920 10/06/18 0030  NA 135 137 135 135 137  K 5.2* 4.6 3.0* 4.3 3.3*  CL 97* 100 97* 98 97*  CO2 21* 21* 25 22 25   GLUCOSE 77 78 74 91 88  BUN 61* 74* 21* 71* 28*  CREATININE 7.85* 8.81* 4.51* 8.00* 4.13*  CALCIUM 8.9 8.6* 8.0* 8.6* 8.4*   Liver Function Tests: Recent Labs  Lab 09/29/18 1046 10/05/18 1920  AST 32 31  ALT 20 25  ALKPHOS 58 44  BILITOT 0.7 0.5  PROT 5.5* 4.6*  ALBUMIN 2.3* 2.0*   Recent Labs  Lab 10/05/18 1920  LIPASE 24   No results for input(s): AMMONIA in the last 168 hours. Cardiac Enzymes: Recent Labs  Lab 10/05/18 1418  TROPONINI 0.04*   BNP (last 3 results) Recent Labs    05/13/18 0539 09/15/18 2126  BNP >4,500.0* 3,309.9*    ProBNP (last 3 results) No results for input(s): PROBNP in the last 8760 hours.  CBG: Recent Labs  Lab 09/29/18 1903 10/05/18 1346 10/05/18 1439  GLUCAP 242* 55* 141*   Recent Results (from the past 240 hour(s))  SARS Coronavirus 2 (CEPHEID - Performed in [place] hospital lab), Hosp Order     Status: None   Collection Time: 10/05/18  2:48 PM  Result Value Ref Range Status   SARS Coronavirus 2 NEGATIVE NEGATIVE Final    Comment: (NOTE) If result is NEGATIVE SARS-CoV-2 target nucleic acids are NOT DETECTED. The SARS-CoV-2 RNA is generally detectable in upper and lower  respiratory specimens during the acute phase of infection. The lowest  concentration  of SARS-CoV-2 viral copies this assay can detect is 250  copies / mL. A negative result does not preclude SARS-CoV-2 infection  and should not be used as the sole basis for treatment or other  patient management decisions.  A negative result may occur with  improper specimen collection / handling, submission of specimen other  than nasopharyngeal swab, presence of viral mutation(s) within the  areas targeted by this assay, and inadequate number of viral copies  (<250 copies / mL). A negative result must be combined with clinical  observations, patient history, and epidemiological information. If result is POSITIVE SARS-CoV-2 target nucleic acids are DETECTED. The SARS-CoV-2 RNA is generally detectable in upper and lower  respiratory specimens dur ing the acute phase of infection.  Positive  results are  indicative of active infection with SARS-CoV-2.  Clinical  correlation with patient history and other diagnostic information is  necessary to determine patient infection status.  Positive results do  not rule out bacterial infection or co-infection with other viruses. If result is PRESUMPTIVE POSTIVE SARS-CoV-2 nucleic acids MAY BE PRESENT.   A presumptive positive result was obtained on the submitted specimen  and confirmed on repeat testing.  While 2019 novel coronavirus  (SARS-CoV-2) nucleic acids may be present in the submitted sample  additional confirmatory testing may be necessary for epidemiological  and / or clinical management purposes  to differentiate between  SARS-CoV-2 and other Sarbecovirus currently known to infect humans.  If clinically indicated additional testing with an alternate test  methodology [phone]) is advised. The SARS-CoV-2 RNA is generally  detectable in upper and lower respiratory sp ecimens during the acute  phase of infection. The expected result is Negative. Fact Sheet for Patients:  StrictlyIdeas.no Fact Sheet for Healthcare  Providers: BankingDealers.co.za This test is not yet approved or cleared by the [place] FDA and has been authorized for detection and/or diagnosis of SARS-CoV-2 by FDA under an Emergency Use Authorization (EUA).  This EUA will remain in effect (meaning this test can be used) for the duration of the COVID-19 declaration under Section 564(b)(1) of the Act, 21 U.S.C. section 360bbb-3(b)(1), unless the authorization is terminated or revoked sooner. Performed at [facility], [place] [address], [place], [place] [address]   MRSA PCR Screening     Status: None   Collection Time: 10/06/18  2:04 AM  Result Value Ref Range Status   MRSA by PCR NEGATIVE NEGATIVE Final    Comment:        The GeneXpert MRSA Assay (FDA approved for NASAL specimens only), is one component of a comprehensive MRSA colonization surveillance program. It is not intended to diagnose MRSA infection nor to guide or monitor treatment for MRSA infections. Performed at [facility], [place] [address], [place], [place] [address]      Studies: Dg Chest Portable 1 View  Result Date: 10/05/2018 CLINICAL DATA:  Shortness of breath.  Hypotension. EXAM: PORTABLE CHEST 1 VIEW COMPARISON:  Chest x-ray dated September 15, 2018. FINDINGS: The patient is rotated to the right. Interval removal of the left internal jugular tunnel dialysis catheter with placement of a new right internal jugular tunnel dialysis catheter with tip in the proximal right atrium. Stable mild cardiomegaly. Normal mediastinal contours. Normal pulmonary vascularity. No focal consolidation, pleural effusion, or pneumothorax. No acute osseous abnormality. IMPRESSION: No active disease. Electronically Signed   By: Titus Dubin M.D.   On: 10/05/2018 14:39    Scheduled Meds: . Chlorhexidine Gluconate Cloth  6 each Topical Q0600  . feeding supplement (NEPRO CARB STEADY)  237 mL Oral BID BM  . feeding supplement (PRO-STAT SUGAR FREE 64)   30 mL Oral BID  . multivitamin  1 tablet Oral QHS  . ondansetron (ZOFRAN) IV  4 mg Intravenous Q6H  . sucralfate  1 g Oral TID WC & HS    Continuous Infusions: . sodium chloride    . pantoprazole (PROTONIX) IV       Flora Lipps, MD  [facility] 10/06/2018

## 2018-10-06 NOTE — Consult Note (Signed)
Referring Provider:           Primary Care Physician:  [name], MD Primary Gastroenterologist:  Oretha Caprice, MD           Reason for Consultation:  Anemia          ASSESSMENT / PLAN:    64. 58 yo female with ESRD on HD T/TH/S with recurrent black, FOBT+ stool and acute on chronic anemia. Admitted mid April and again on 09/29/18 with melena and anemia requiring transfusion. Inpatient EGD inboth times showed non-bleeding gastric ulcer -Of note, she does take Auryxia (phosphorus binder) which contains iron and can turn stool black.  - She has been transfused to 8.3. No black stools today. She is NPO -Will schedule for small bowel enteroscopy today. Perhaps blood loss has been distal to the gastric ulcer.  -continue BID IV PPI -continue QID carafate. Will reassess ulcer today  2. Anemia Hgb 7.9 on 09/30/18, down to 5.6 in ED yesterday. Hgb up nearly 4 grams after only two units so we see where she settles.    HPI:   Karen Morrow is a 58 y.o. female with end-stage renal disease on hemodialysis.  Patient was hospitalized mid April and again earlier this month with black stools acute on chronic anemia requiring blood transfusions.  She had an inpatient EGD during both admissions which showed nonbleeding gastric ulcer and a duodenal diverticulum.  She was discharged home 09/30/2018 on Carafate and PPI.  Departing hemoglobin 7.9.   Patient believes her stool color returned to normal at home.  Yesterday she developed recurrent loose black stool, multiple episodes.  Unable to attend dialysis yesterday because of the bleeding.  She presented to the ED yesterday, hemoglobin 5.6.  Initial BP 87/63.  Hypotension quickly resolved.  She was sent for dialysis yesterday.  No BMs or melena today.  No abdominal pain, no nausea or vomiting.  Denies NSAID use. Patient says she takes all her prescribed home meds   MOST RECENT GI STUDIES:   EGD 09/30/18 - black stools / anemia.  LA Grade C reflux  esophagitis. - Small hiatal hernia. - Non-bleeding gastric ulcer with a clean ulcer base ([place] Class III). - Erosive nodular gastritis. Biopsied. - Non-bleeding duodenal diverticulum. - Normal second portion of the duodenum.  Stomach, biopsy, Antrum - REACTIVE GASTROPATHY. - THERE IS NO EVIDENCE OF HELICOBACTER PYLORI, DYSPLASIA OR MALIGNANCY. - SEE COMMENT 2. Stomach, biopsy, Random - REACTIVE-TYPE GASTROPATHY WITH FOCAL ISCHEMIC-LIKE CHANGES. - THERE IS NO EVIDENCE OF HELICOBACTER PYLORI, DYSPLASIA OR MALIGNANCY. - SEE COMMENT.    Past Medical History:  Diagnosis Date  . Anginal pain ([place])   . Asthma   . CHF (congestive heart failure) ([place])   . Chronic kidney disease    dialysis 3x wk  . Dyspnea   . Frequent bowel movements   . GERD (gastroesophageal reflux disease)   . Hepatitis C antibody test positive   . Hypertension    "just dx'd today" (11/03/2012)    Past Surgical History:  Procedure Laterality Date  . ABDOMINAL HYSTERECTOMY     "partial" (11/03/2012)  . AV FISTULA PLACEMENT Right 12/03/2013   Procedure: CREATION OF ARTERIOVENOUS (AV) FISTULA RIGHT ARM ;  Surgeon: Rosetta Posner, MD;  Location: [facility];  Service: Vascular;  Laterality: Right;  . AV FISTULA PLACEMENT Left 05/17/2018   Procedure: CREATION OF BRACHIOCEPHALIC FISTULA LEFT ARM;  Surgeon: Waynetta Sandy, MD;  Location: [facility];  Service: Vascular;  Laterality: Left;  .  [place] REMOVAL Right 03/16/2018   Procedure: REMOVAL OF ARTERIOVENOUS GORETEX GRAFT ([place]) RIGHT ARM;  Surgeon: Waynetta Sandy, MD;  Location: [facility];  Service: Vascular;  Laterality: Right;  . BASCILIC VEIN TRANSPOSITION Right 03/06/2014   Procedure: SECOND STAGE BASILIC VEIN TRANSPOSITION;  Surgeon: Rosetta Posner, MD;  Location: [facility];  Service: Vascular;  Laterality: Right;  . BIOPSY  09/30/2018   Procedure: BIOPSY;  Surgeon: Mauri Pole, MD;  Location: [facility];  Service: Endoscopy;;  . ESOPHAGOGASTRODUODENOSCOPY  N/A 06/05/2013   Procedure: ESOPHAGOGASTRODUODENOSCOPY (EGD);  Surgeon: Winfield Cunas., MD;  Location: [facility];  Service: Endoscopy;  Laterality: N/A;  . ESOPHAGOGASTRODUODENOSCOPY N/A 09/08/2016   Procedure: ESOPHAGOGASTRODUODENOSCOPY (EGD);  Surgeon: Milus Banister, MD;  Location: [facility];  Service: Endoscopy;  Laterality: N/A;  . ESOPHAGOGASTRODUODENOSCOPY (EGD) WITH PROPOFOL N/A 09/17/2018   Procedure: ESOPHAGOGASTRODUODENOSCOPY (EGD) WITH PROPOFOL;  Surgeon: Carol Ada, MD;  Location: [facility];  Service: Endoscopy;  Laterality: N/A;  . ESOPHAGOGASTRODUODENOSCOPY (EGD) WITH PROPOFOL N/A 09/30/2018   Procedure: ESOPHAGOGASTRODUODENOSCOPY (EGD) WITH PROPOFOL;  Surgeon: Mauri Pole, MD;  Location: [facility];  Service: Endoscopy;  Laterality: N/A;  . INSERTION OF DIALYSIS CATHETER Left 03/16/2018   Procedure: PLACEMENT OF TUNNEL DIALYSIS CATHETER;  Surgeon: Waynetta Sandy, MD;  Location: [facility];  Service: Vascular;  Laterality: Left;  . INSERTION OF DIALYSIS CATHETER Right 09/26/2018   Procedure: INSERTION OF DIALYSIS  TUNNELED CATHETER;  Surgeon: Elam Dutch, MD;  Location: [facility] OR;  Service: Vascular;  Laterality: Right;  . IR REMOVAL TUN CV CATH W/O FL  09/17/2018  . REVISON OF ARTERIOVENOUS FISTULA Right 01/28/2017   Procedure: REVISON OF RIGHT ARM ARTERIOVENOUS FISTULA USING 8MMX10CM GORETEX GRAFT;  Surgeon: Angelia Mould, MD;  Location: [facility];  Service: Vascular;  Laterality: Right;    Prior to Admission medications   Medication Sig Start Date End Date Taking? Authorizing Provider  acetaminophen (TYLENOL) 500 MG tablet Take 500-1,000 mg by mouth every 6 (six) hours as needed for headache (pain).    [provider]  albuterol (PROVENTIL HFA;VENTOLIN HFA) 108 (90 Base) MCG/ACT inhaler TAKE 2 PUFFS BY MOUTH EVERY 6 HOURS AS NEEDED FOR WHEEZE OR SHORTNESS OF BREATH Patient taking differently: Inhale 2 puffs into the lungs every 6 (six)  hours as needed for wheezing.  05/09/18   [name], MD  albuterol (PROVENTIL) (2.5 MG/3ML) 0.083% nebulizer solution Take 3 mLs (2.5 mg total) by nebulization every 6 (six) hours as needed for wheezing or shortness of breath. MUST MAKE APPT FOR FURTHER REFILLS 08/15/18   [name], MD  Amino Acids-Protein Hydrolys (FEEDING SUPPLEMENT, PRO-STAT SUGAR FREE 64,) LIQD Take 30 mLs by mouth 2 (two) times daily. 09/19/18   Hosie Poisson, MD  amLODipine (NORVASC) 10 MG tablet Take 1 tablet (10 mg total) by mouth daily. Patient taking differently: Take 10 mg by mouth at bedtime.  05/16/18   Thurnell Lose, MD  b complex-vitamin c-folic acid (NEPHRO-VITE) 0.8 MG TABS tablet Take 1 tablet by mouth every evening. 07/27/18   [provider]  cinacalcet (SENSIPAR) 30 MG tablet TAKE 1 TABLET BY MOUTH DAILY EVERY EVENING (DO NOT TAKE LESS THAN 12 HOURS PRIOR TO DIALYSIS) 09/19/18   Hosie Poisson, MD  ferric citrate (AURYXIA) 1 GM 210 MG(Fe) tablet Take 420 mg by mouth 3 (three) times daily with meals.    [provider]  metoprolol tartrate (LOPRESSOR) 25 MG tablet Take 25 mg by mouth 2 (  two) times daily.    [provider]  Nutritional Supplements (FEEDING SUPPLEMENT, NEPRO CARB STEADY,) LIQD Take 237 mLs by mouth 2 (two) times daily between meals. 10/01/18   Mercy Riding, MD  oxyCODONE-acetaminophen (PERCOCET/ROXICET) 5-325 MG tablet Take 1 tablet by mouth every 6 (six) hours as needed for severe pain. 09/26/18   Elam Dutch, MD  pantoprazole (PROTONIX) 40 MG tablet Take 1 tablet (40 mg total) by mouth 2 (two) times daily. 10/01/18   Mercy Riding, MD  sucralfate (CARAFATE) 1 g tablet Take 1 tablet (1 g total) by mouth 4 (four) times daily -  with meals and at bedtime. 10/01/18   Mercy Riding, MD    Current Facility-Administered Medications  Medication Dose Route Frequency Provider Last Rate Last Dose  . 0.9 %  sodium chloride infusion  10 mL/hr Intravenous Once  Hosie Poisson, MD      . acetaminophen (TYLENOL) tablet 500-1,000 mg  500-1,000 mg Oral Q6H PRN Hosie Poisson, MD      . albuterol (PROVENTIL) (2.5 MG/3ML) 0.083% nebulizer solution 2.5 mg  2.5 mg Nebulization Q6H PRN Hosie Poisson, MD      . Chlorhexidine Gluconate Cloth 2 % PADS 6 each  6 each Topical Q0600 Claudia Desanctis, MD   6 each at 10/06/18 [phone]  . [START ON 10/07/2018] Darbepoetin Alfa (ARANESP) injection 60 mcg  60 mcg Intravenous Q Sat-HD Dwana Melena, MD      . Derrill Memo ON 10/07/2018] doxercalciferol (HECTOROL) injection 3 mcg  3 mcg Intravenous Q T,Th,Sa-HD Dwana Melena, MD      . feeding supplement (NEPRO CARB STEADY) liquid 237 mL  237 mL Oral BID BM Hosie Poisson, MD      . feeding supplement (PRO-STAT SUGAR FREE 64) liquid 30 mL  30 mL Oral BID Hosie Poisson, MD   30 mL at 10/05/18 2330  . multivitamin (RENA-VIT) tablet 1 tablet  1 tablet Oral QHS Hosie Poisson, MD   1 tablet at 10/05/18 2330  . ondansetron (ZOFRAN) injection 4 mg  4 mg Intravenous Q6H Hosie Poisson, MD   4 mg at 10/06/18 0604  . pantoprazole (PROTONIX) 80 mg in sodium chloride 0.9 % 100 mL IVPB  80 mg Intravenous Once Hosie Poisson, MD      . sucralfate (CARAFATE) tablet 1 g  1 g Oral TID WC & HS Hosie Poisson, MD   1 g at 10/05/18 2330    Allergies as of 10/05/2018 - Review Complete 10/05/2018  Allergen Reaction Noted  . Cefazolin Other (See Comments) 12/05/2013    Family History  Problem Relation Age of Onset  . Lupus Mother   . Diabetes Father   . Heart attack Father     Social History   Socioeconomic History  . Marital status: Married    Spouse name: Not on file  . Number of children: Not on file  . Years of education: Not on file  . Highest education level: Not on file  Occupational History  . Not on file  Social Needs  . Financial resource strain: Not on file  . Food insecurity:    Worry: Not on file    Inability: Not on file  . Transportation needs:    Medical: Not on file    Non-medical:  Not on file  Tobacco Use  . Smoking status: Light Tobacco Smoker    Packs/day: 0.50    Years: 20.00    Pack years: 10.00  Types: Cigarettes  . Smokeless tobacco: Never Used  Substance and Sexual Activity  . Alcohol use: Not Currently    Alcohol/week: 1.0 standard drinks    Types: 1 Glasses of wine per week  . Drug use: Not Currently    Types: Marijuana  . Sexual activity: Not Currently  Lifestyle  . Physical activity:    Days per week: Not on file    Minutes per session: Not on file  . Stress: Not on file  Relationships  . Social connections:    Talks on phone: Not on file    Gets together: Not on file    Attends religious service: Not on file    Active member of club or organization: Not on file    Attends meetings of clubs or organizations: Not on file    Relationship status: Not on file  . Intimate partner violence:    Fear of current or ex partner: Not on file    Emotionally abused: Not on file    Physically abused: Not on file    Forced sexual activity: Not on file  Other Topics Concern  . Not on file  Social History Narrative  . Not on file    Review of Systems: All systems reviewed and negative except where noted in HPI.  Physical Exam: Vital signs in last 24 hours: Temp:  [97.5 F (36.4 C)-98.5 F (36.9 C)] 98 F (36.7 C) (05/08 0519) Pulse Rate:  [93-122] 99 (05/08 0612) Resp:  [0-22] 18 (05/08 0519) BP: (84-129)/(42-81) 110/76 (05/08 0612) SpO2:  [92 %-100 %] 100 % (05/08 0612) Weight:  [51.9 kg-53.1 kg] 51.9 kg (05/07 2315) Last BM Date: 10/05/18 General:   Alert, female in NAD Psych:  Pleasant, cooperative. Normal mood and affect. Eyes:  Pupils equal, sclera clear, no icterus.   Conjunctiva pink. Ears:  Normal auditory acuity. Nose:  No deformity, discharge,  or lesions. Neck:  Supple; no masses Lungs:  Clear throughout to auscultation.   No wheezes, crackles, or rhonchi.  Heart:  Regular rate and rhythm; + murmur, no lower extremity edema.  Dialysis cath in right chest Abdomen:  Soft, non-distended, nontender, BS active, no palp mass    Rectal:  Deferred  Msk:  Symmetrical without gross deformities. . Neurologic:  Alert and  oriented x4;  grossly normal neurologically. Skin:  Intact without significant lesions or rashes.   Intake/Output from previous day: 05/07 0701 - 05/08 0700 In: 630 [Blood:630] Out: 1000  Intake/Output this shift: No intake/output data recorded.  Lab Results: Recent Labs    10/05/18 1418 10/06/18 0025  WBC 8.8 7.7  HGB 5.6* 9.1*  HCT 18.8* 26.2*  PLT 111* 86*   BMET Recent Labs    10/05/18 1920 10/06/18 0030  NA 135 137  K 4.3 3.3*  CL 98 97*  CO2 22 25  GLUCOSE 91 88  BUN 71* 28*  CREATININE 8.00* 4.13*  CALCIUM 8.6* 8.4*   LFT Recent Labs    10/05/18 1920  PROT 4.6*  ALBUMIN 2.0*  AST 31  ALT 25  ALKPHOS 44  BILITOT 0.5   PT/INR Recent Labs    10/05/18 1620  LABPROT 15.4*  INR 1.2   Hepatitis Panel No results for input(s): HEPBSAG, HCVAB, HEPAIGM, HEPBIGM in the last 72 hours.   Studies/Results: Dg Chest Portable 1 View  Result Date: 10/05/2018 CLINICAL DATA:  Shortness of breath.  Hypotension. EXAM: PORTABLE CHEST 1 VIEW COMPARISON:  Chest x-ray dated September 15, 2018. FINDINGS: The patient  is rotated to the right. Interval removal of the left internal jugular tunnel dialysis catheter with placement of a new right internal jugular tunnel dialysis catheter with tip in the proximal right atrium. Stable mild cardiomegaly. Normal mediastinal contours. Normal pulmonary vascularity. No focal consolidation, pleural effusion, or pneumothorax. No acute osseous abnormality. IMPRESSION: No active disease. Electronically Signed   By: Titus Dubin M.D.   On: 10/05/2018 14:39     Tye Savoy, NP-C @  10/06/2018, 8:54 AM

## 2018-10-06 NOTE — H&P (View-Only) (Signed)
Referring Provider:           Primary Care Physician:  [name], MD Primary Gastroenterologist:  Oretha Caprice, MD           Reason for Consultation:  Anemia          ASSESSMENT / PLAN:    9. 58 yo female with ESRD on HD T/TH/S with recurrent black, FOBT+ stool and acute on chronic anemia. Admitted mid April and again on 09/29/18 with melena and anemia requiring transfusion. Inpatient EGD inboth times showed non-bleeding gastric ulcer -Of note, she does take Auryxia (phosphorus binder) which contains iron and can turn stool black.  - She has been transfused to 8.3. No black stools today. She is NPO -Will schedule for small bowel enteroscopy today. Perhaps blood loss has been distal to the gastric ulcer.  -continue BID IV PPI -continue QID carafate. Will reassess ulcer today  2. Anemia Hgb 7.9 on 09/30/18, down to 5.6 in ED yesterday. Hgb up nearly 4 grams after only two units so we see where she settles.    HPI:   Karen Morrow is a 58 y.o. female with end-stage renal disease on hemodialysis.  Patient was hospitalized mid April and again earlier this month with black stools acute on chronic anemia requiring blood transfusions.  She had an inpatient EGD during both admissions which showed nonbleeding gastric ulcer and a duodenal diverticulum.  She was discharged home 09/30/2018 on Carafate and PPI.  Departing hemoglobin 7.9.   Patient believes her stool color returned to normal at home.  Yesterday she developed recurrent loose black stool, multiple episodes.  Unable to attend dialysis yesterday because of the bleeding.  She presented to the ED yesterday, hemoglobin 5.6.  Initial BP 87/63.  Hypotension quickly resolved.  She was sent for dialysis yesterday.  No BMs or melena today.  No abdominal pain, no nausea or vomiting.  Denies NSAID use. Patient says she takes all her prescribed home meds   MOST RECENT GI STUDIES:   EGD 09/30/18 - black stools / anemia.  LA Grade C reflux  esophagitis. - Small hiatal hernia. - Non-bleeding gastric ulcer with a clean ulcer base ([place] Class III). - Erosive nodular gastritis. Biopsied. - Non-bleeding duodenal diverticulum. - Normal second portion of the duodenum.  Stomach, biopsy, Antrum - REACTIVE GASTROPATHY. - THERE IS NO EVIDENCE OF HELICOBACTER PYLORI, DYSPLASIA OR MALIGNANCY. - SEE COMMENT 2. Stomach, biopsy, Random - REACTIVE-TYPE GASTROPATHY WITH FOCAL ISCHEMIC-LIKE CHANGES. - THERE IS NO EVIDENCE OF HELICOBACTER PYLORI, DYSPLASIA OR MALIGNANCY. - SEE COMMENT.    Past Medical History:  Diagnosis Date  . Anginal pain ([place])   . Asthma   . CHF (congestive heart failure) ([place])   . Chronic kidney disease    dialysis 3x wk  . Dyspnea   . Frequent bowel movements   . GERD (gastroesophageal reflux disease)   . Hepatitis C antibody test positive   . Hypertension    "just dx'd today" (11/03/2012)    Past Surgical History:  Procedure Laterality Date  . ABDOMINAL HYSTERECTOMY     "partial" (11/03/2012)  . AV FISTULA PLACEMENT Right 12/03/2013   Procedure: CREATION OF ARTERIOVENOUS (AV) FISTULA RIGHT ARM ;  Surgeon: Rosetta Posner, MD;  Location: [place];  Service: Vascular;  Laterality: Right;  . AV FISTULA PLACEMENT Left 05/17/2018   Procedure: CREATION OF BRACHIOCEPHALIC FISTULA LEFT ARM;  Surgeon: Waynetta Sandy, MD;  Location: [place];  Service: Vascular;  Laterality: Left;  .  [place] REMOVAL Right 03/16/2018   Procedure: REMOVAL OF ARTERIOVENOUS GORETEX GRAFT ([place]) RIGHT ARM;  Surgeon: Waynetta Sandy, MD;  Location: [place];  Service: Vascular;  Laterality: Right;  . BASCILIC VEIN TRANSPOSITION Right 03/06/2014   Procedure: SECOND STAGE BASILIC VEIN TRANSPOSITION;  Surgeon: Rosetta Posner, MD;  Location: [place];  Service: Vascular;  Laterality: Right;  . BIOPSY  09/30/2018   Procedure: BIOPSY;  Surgeon: Mauri Pole, MD;  Location: [place] [facility];  Service: Endoscopy;;  . ESOPHAGOGASTRODUODENOSCOPY  N/A 06/05/2013   Procedure: ESOPHAGOGASTRODUODENOSCOPY (EGD);  Surgeon: Winfield Cunas., MD;  Location: [facility];  Service: Endoscopy;  Laterality: N/A;  . ESOPHAGOGASTRODUODENOSCOPY N/A 09/08/2016   Procedure: ESOPHAGOGASTRODUODENOSCOPY (EGD);  Surgeon: Milus Banister, MD;  Location: [place];  Service: Endoscopy;  Laterality: N/A;  . ESOPHAGOGASTRODUODENOSCOPY (EGD) WITH PROPOFOL N/A 09/17/2018   Procedure: ESOPHAGOGASTRODUODENOSCOPY (EGD) WITH PROPOFOL;  Surgeon: Carol Ada, MD;  Location: [place];  Service: Endoscopy;  Laterality: N/A;  . ESOPHAGOGASTRODUODENOSCOPY (EGD) WITH PROPOFOL N/A 09/30/2018   Procedure: ESOPHAGOGASTRODUODENOSCOPY (EGD) WITH PROPOFOL;  Surgeon: Mauri Pole, MD;  Location: [facility];  Service: Endoscopy;  Laterality: N/A;  . INSERTION OF DIALYSIS CATHETER Left 03/16/2018   Procedure: PLACEMENT OF TUNNEL DIALYSIS CATHETER;  Surgeon: Waynetta Sandy, MD;  Location: [place];  Service: Vascular;  Laterality: Left;  . INSERTION OF DIALYSIS CATHETER Right 09/26/2018   Procedure: INSERTION OF DIALYSIS  TUNNELED CATHETER;  Surgeon: Elam Dutch, MD;  Location[facility];  Service: Vascular;  Laterality: Right;  . IR REMOVAL TUN CV CATH W/O FL  09/17/2018  . REVISON OF ARTERIOVENOUS FISTULA Right 01/28/2017   Procedure: REVISON OF RIGHT ARM ARTERIOVENOUS FISTULA USING 8MMX10CM GORETEX GRAFT;  Surgeon: Angelia Mould, MD;  Location: [place];  Service: Vascular;  Laterality: Right;    Prior to Admission medications   Medication Sig Start Date End Date Taking? Authorizing Provider  acetaminophen (TYLENOL) 500 MG tablet Take 500-1,000 mg by mouth every 6 (six) hours as needed for headache (pain).    [provider]  albuterol (PROVENTIL HFA;VENTOLIN HFA) 108 (90 Base) MCG/ACT inhaler TAKE 2 PUFFS BY MOUTH EVERY 6 HOURS AS NEEDED FOR WHEEZE OR SHORTNESS OF BREATH Patient taking differently: Inhale 2 puffs into the lungs every 6 (six)  hours as needed for wheezing.  05/09/18   [name], MD  albuterol (PROVENTIL) (2.5 MG/3ML) 0.083% nebulizer solution Take 3 mLs (2.5 mg total) by nebulization every 6 (six) hours as needed for wheezing or shortness of breath. MUST MAKE APPT FOR FURTHER REFILLS 08/15/18   [name], MD  Amino Acids-Protein Hydrolys (FEEDING SUPPLEMENT, PRO-STAT SUGAR FREE 64,) LIQD Take 30 mLs by mouth 2 (two) times daily. 09/19/18   Hosie Poisson, MD  amLODipine (NORVASC) 10 MG tablet Take 1 tablet (10 mg total) by mouth daily. Patient taking differently: Take 10 mg by mouth at bedtime.  05/16/18   Thurnell Lose, MD  b complex-vitamin c-folic acid (NEPHRO-VITE) 0.8 MG TABS tablet Take 1 tablet by mouth every evening. 07/27/18   [provider]  cinacalcet (SENSIPAR) 30 MG tablet TAKE 1 TABLET BY MOUTH DAILY EVERY EVENING (DO NOT TAKE LESS THAN 12 HOURS PRIOR TO DIALYSIS) 09/19/18   Hosie Poisson, MD  ferric citrate (AURYXIA) 1 GM 210 MG(Fe) tablet Take 420 mg by mouth 3 (three) times daily with meals.    [provider]  metoprolol tartrate (LOPRESSOR) 25 MG tablet Take 25 mg by mouth 2 (  two) times daily.    [provider]  Nutritional Supplements (FEEDING SUPPLEMENT, NEPRO CARB STEADY,) LIQD Take 237 mLs by mouth 2 (two) times daily between meals. 10/01/18   Mercy Riding, MD  oxyCODONE-acetaminophen (PERCOCET/ROXICET) 5-325 MG tablet Take 1 tablet by mouth every 6 (six) hours as needed for severe pain. 09/26/18   Elam Dutch, MD  pantoprazole (PROTONIX) 40 MG tablet Take 1 tablet (40 mg total) by mouth 2 (two) times daily. 10/01/18   Mercy Riding, MD  sucralfate (CARAFATE) 1 g tablet Take 1 tablet (1 g total) by mouth 4 (four) times daily -  with meals and at bedtime. 10/01/18   Mercy Riding, MD    Current Facility-Administered Medications  Medication Dose Route Frequency Provider Last Rate Last Dose  . 0.9 %  sodium chloride infusion  10 mL/hr Intravenous Once  Hosie Poisson, MD      . acetaminophen (TYLENOL) tablet 500-1,000 mg  500-1,000 mg Oral Q6H PRN Hosie Poisson, MD      . albuterol (PROVENTIL) (2.5 MG/3ML) 0.083% nebulizer solution 2.5 mg  2.5 mg Nebulization Q6H PRN Hosie Poisson, MD      . Chlorhexidine Gluconate Cloth 2 % PADS 6 each  6 each Topical Q0600 Claudia Desanctis, MD   6 each at 10/06/18 [phone]  . [START ON 10/07/2018] Darbepoetin Alfa (ARANESP) injection 60 mcg  60 mcg Intravenous Q Sat-HD Dwana Melena, MD      . Derrill Memo ON 10/07/2018] doxercalciferol (HECTOROL) injection 3 mcg  3 mcg Intravenous Q T,Th,Sa-HD Dwana Melena, MD      . feeding supplement (NEPRO CARB STEADY) liquid 237 mL  237 mL Oral BID BM Hosie Poisson, MD      . feeding supplement (PRO-STAT SUGAR FREE 64) liquid 30 mL  30 mL Oral BID Hosie Poisson, MD   30 mL at 10/05/18 2330  . multivitamin (RENA-VIT) tablet 1 tablet  1 tablet Oral QHS Hosie Poisson, MD   1 tablet at 10/05/18 2330  . ondansetron (ZOFRAN) injection 4 mg  4 mg Intravenous Q6H Hosie Poisson, MD   4 mg at 10/06/18 0604  . pantoprazole (PROTONIX) 80 mg in sodium chloride 0.9 % 100 mL IVPB  80 mg Intravenous Once Hosie Poisson, MD      . sucralfate (CARAFATE) tablet 1 g  1 g Oral TID WC & HS Hosie Poisson, MD   1 g at 10/05/18 2330    Allergies as of 10/05/2018 - Review Complete 10/05/2018  Allergen Reaction Noted  . Cefazolin Other (See Comments) 12/05/2013    Family History  Problem Relation Age of Onset  . Lupus Mother   . Diabetes Father   . Heart attack Father     Social History   Socioeconomic History  . Marital status: Married    Spouse name: Not on file  . Number of children: Not on file  . Years of education: Not on file  . Highest education level: Not on file  Occupational History  . Not on file  Social Needs  . Financial resource strain: Not on file  . Food insecurity:    Worry: Not on file    Inability: Not on file  . Transportation needs:    Medical: Not on file    Non-medical:  Not on file  Tobacco Use  . Smoking status: Light Tobacco Smoker    Packs/day: 0.50    Years: 20.00    Pack years: 10.00  Types: Cigarettes  . Smokeless tobacco: Never Used  Substance and Sexual Activity  . Alcohol use: Not Currently    Alcohol/week: 1.0 standard drinks    Types: 1 Glasses of wine per week  . Drug use: Not Currently    Types: Marijuana  . Sexual activity: Not Currently  Lifestyle  . Physical activity:    Days per week: Not on file    Minutes per session: Not on file  . Stress: Not on file  Relationships  . Social connections:    Talks on phone: Not on file    Gets together: Not on file    Attends religious service: Not on file    Active member of club or organization: Not on file    Attends meetings of clubs or organizations: Not on file    Relationship status: Not on file  . Intimate partner violence:    Fear of current or ex partner: Not on file    Emotionally abused: Not on file    Physically abused: Not on file    Forced sexual activity: Not on file  Other Topics Concern  . Not on file  Social History Narrative  . Not on file    Review of Systems: All systems reviewed and negative except where noted in HPI.  Physical Exam: Vital signs in last 24 hours: Temp:  [97.5 F (36.4 C)-98.5 F (36.9 C)] 98 F (36.7 C) (05/08 0519) Pulse Rate:  [93-122] 99 (05/08 0612) Resp:  [0-22] 18 (05/08 0519) BP: (84-129)/(42-81) 110/76 (05/08 0612) SpO2:  [92 %-100 %] 100 % (05/08 0612) Weight:  [51.9 kg-53.1 kg] 51.9 kg (05/07 2315) Last BM Date: 10/05/18 General:   Alert, female in NAD Psych:  Pleasant, cooperative. Normal mood and affect. Eyes:  Pupils equal, sclera clear, no icterus.   Conjunctiva pink. Ears:  Normal auditory acuity. Nose:  No deformity, discharge,  or lesions. Neck:  Supple; no masses Lungs:  Clear throughout to auscultation.   No wheezes, crackles, or rhonchi.  Heart:  Regular rate and rhythm; + murmur, no lower extremity edema.  Dialysis cath in right chest Abdomen:  Soft, non-distended, nontender, BS active, no palp mass    Rectal:  Deferred  Msk:  Symmetrical without gross deformities. . Neurologic:  Alert and  oriented x4;  grossly normal neurologically. Skin:  Intact without significant lesions or rashes.   Intake/Output from previous day: 05/07 0701 - 05/08 0700 In: 630 [Blood:630] Out: 1000  Intake/Output this shift: No intake/output data recorded.  Lab Results: Recent Labs    10/05/18 1418 10/06/18 0025  WBC 8.8 7.7  HGB 5.6* 9.1*  HCT 18.8* 26.2*  PLT 111* 86*   BMET Recent Labs    10/05/18 1920 10/06/18 0030  NA 135 137  K 4.3 3.3*  CL 98 97*  CO2 22 25  GLUCOSE 91 88  BUN 71* 28*  CREATININE 8.00* 4.13*  CALCIUM 8.6* 8.4*   LFT Recent Labs    10/05/18 1920  PROT 4.6*  ALBUMIN 2.0*  AST 31  ALT 25  ALKPHOS 44  BILITOT 0.5   PT/INR Recent Labs    10/05/18 1620  LABPROT 15.4*  INR 1.2   Hepatitis Panel No results for input(s): HEPBSAG, HCVAB, HEPAIGM, HEPBIGM in the last 72 hours.   Studies/Results: Dg Chest Portable 1 View  Result Date: 10/05/2018 CLINICAL DATA:  Shortness of breath.  Hypotension. EXAM: PORTABLE CHEST 1 VIEW COMPARISON:  Chest x-ray dated September 15, 2018. FINDINGS: The patient  is rotated to the right. Interval removal of the left internal jugular tunnel dialysis catheter with placement of a new right internal jugular tunnel dialysis catheter with tip in the proximal right atrium. Stable mild cardiomegaly. Normal mediastinal contours. Normal pulmonary vascularity. No focal consolidation, pleural effusion, or pneumothorax. No acute osseous abnormality. IMPRESSION: No active disease. Electronically Signed   By: Titus Dubin M.D.   On: 10/05/2018 14:39     Tye Savoy, NP-C @  10/06/2018, 8:54 AM

## 2018-10-06 NOTE — Progress Notes (Signed)
Discussed enteroscopy findings with Karen Morrow today after her procedure.  No active bleeding to warrant endoscopic intervention.  The previously noted ulcer was perhaps a little bit smaller, and without high-grade stigmata of bleeding.  Otherwise normal small bowel to the proximal jejunum.  She states she is never had a colonoscopy before.  Feel this is a reasonable next step, as she certainly has symptomatic anemia presumably from a bleed, without obvious source.  However, she does not want to stay in the hospital to have this done.  Therefore, recommend the following:  -Serial H/H checks with transfusions per protocol -Plan for expedited outpatient colonoscopy.  Given her comorbidities, this will need to be done at either [place] or [facility] as an outpatient - If colonoscopy unrevealing, I told her that I would recommend VCE afterwards to evaluate for distal small bowel pathology - If she should change her mind and want to have a colonoscopy done as an inpatient, can coordinate, as this would still need to be done with MAC assistance -All questions answered -Okay to resume full liquids now and advance as tolerated to previous diet

## 2018-10-06 NOTE — Anesthesia Preprocedure Evaluation (Addendum)
Anesthesia Evaluation  Patient identified by MRN, date of birth, ID band Patient awake    Reviewed: Allergy & Precautions, NPO status , Patient's Chart, lab work & pertinent test results  History of Anesthesia Complications Negative for: history of anesthetic complications  Airway Mallampati: II  TM Distance: >3 FB Neck ROM: Full    Dental no notable dental hx. (+) Dental Advisory Given   Pulmonary asthma , Current Smoker,    Pulmonary exam normal breath sounds clear to auscultation       Cardiovascular hypertension, Pt. on medications  Rhythm:Regular Rate:Normal + Systolic murmurs    Neuro/Psych negative neurological ROS  negative psych ROS   GI/Hepatic PUD, GERD  ,(+) Cirrhosis       , Hepatitis -, C  Endo/Other  negative endocrine ROS  Renal/GU DialysisRenal disease  negative genitourinary   Musculoskeletal negative musculoskeletal ROS (+)   Abdominal   Peds negative pediatric ROS (+)  Hematology  (+) Blood dyscrasia, anemia ,   Anesthesia Other Findings   Reproductive/Obstetrics negative OB ROS                             Anesthesia Physical  Anesthesia Plan  ASA: IV  Anesthesia Plan: MAC   Post-op Pain Management:    Induction: Intravenous  PONV Risk Score and Plan: 2 and Ondansetron and Propofol infusion  Airway Management Planned: Simple Face Mask, Nasal Cannula and Mask  Additional Equipment:   Intra-op Plan:   Post-operative Plan:   Informed Consent: I have reviewed the patients History and Physical, chart, labs and discussed the procedure including the risks, benefits and alternatives for the proposed anesthesia with the patient or authorized representative who has indicated his/her understanding and acceptance.     Dental advisory given  Plan Discussed with: CRNA, Surgeon and Anesthesiologist  Anesthesia Plan Comments:        Anesthesia Quick  Evaluation

## 2018-10-06 NOTE — Progress Notes (Signed)
Renal Navigator notified OP HD clinic of patient's negative COVID 19 test result on 10/05/18 to provide continuity of care and safety.  Alphonzo Cruise Renal Navigator [phone]

## 2018-10-06 NOTE — Interval H&P Note (Signed)
History and Physical Interval Note:  10/06/2018 2:00 PM  Karen Morrow  has presented today for surgery, with the diagnosis of Melena, Anemia.  The various methods of treatment have been discussed with the patient and family. After consideration of risks, benefits and other options for treatment, the patient has consented to  Procedure(s): ESOPHAGOGASTRODUODENOSCOPY (EGD); PUSH ENTEROSCOPY (N/A) as a surgical intervention.  The patient's history has been reviewed, patient examined, no change in status, stable for surgery.  I have reviewed the patient's chart and labs.  Questions were answered to the patient's satisfaction.     [name]

## 2018-10-06 NOTE — Progress Notes (Signed)
[facility] Progress Note    Assessment/ Plan:    HD Orders: SGB 3hr 45 min EDW 52.5kg (lowest in past 2 weeks 53.4kg) Hectorol 34mcg IV TIW Mircera 52mcg given 09/21/2018 (she has been high on 150 q2w)  58 y.o. female ESRD HD TTS cirrhosis p/w GIB. HD through a right chest tunneled catheter w/ left AVF  Infiltration. COVID-19 negative.  Her EGD on 5/2 was notable for bleeding gastric ulcer, erosive nodular gastritis and reflux esophagitis.  Given 2U PRBC 5/7.  1)  End-stage renal disease - Hemodialysis late Thur evening w/ 1L net UF; denied cramping - Via RIJ TC.   We are not using heparin with HD  - Next HD Sat  2)  Acute blood loss anemia - Secondary to GI bleed - Transfusion of 2 units of packed red blood cells with HD 5/7.  3)  GI bleed - Plans for EGD today - PPI   4)  Cirrhosis  - with hep C   5)  Hx of MSSA cath sepsis  - Noted.  Pt reports resting LUE AVF due to infiltration  - she is s/p cefepime with HD through 5/4 per charting   6)  hx HTN - Note that her metoprolol and amlodipine are to be held  Subjective:   Denies f/c/n/v, hematemesis. Denies dyspnea, cough, sore throat  Some blood after a BM this AM.   Objective:   BP 110/76   Pulse 99   Temp 98 F (36.7 C)   Resp 18   Ht 5\' 7"  (1.702 m)   Wt 51.9 kg   SpO2 100%   BMI 17.92 kg/m   Intake/Output Summary (Last 24 hours) at 10/06/2018 0739 Last data filed at 10/06/2018 8144 Gross per 24 hour  Intake 630 ml  Output 1000 ml  Net -370 ml   Weight change:   Physical Exam: General: NCAT NAD, pleasant Heart: S1-S2 no rub appreciated Lungs: CTA b/l Abdomen: Soft nontender nondistended  Extremities: No pitting edema no cyanosis or clubbing Neuro: Alert and oriented x3  Access:  Right chest tunneled catheter; LUA access +bruit but tender   Imaging: Dg Chest Portable 1 View  Result Date: 10/05/2018 CLINICAL DATA:  Shortness of breath.  Hypotension. EXAM: PORTABLE CHEST 1  VIEW COMPARISON:  Chest x-ray dated September 15, 2018. FINDINGS: The patient is rotated to the right. Interval removal of the left internal jugular tunnel dialysis catheter with placement of a new right internal jugular tunnel dialysis catheter with tip in the proximal right atrium. Stable mild cardiomegaly. Normal mediastinal contours. Normal pulmonary vascularity. No focal consolidation, pleural effusion, or pneumothorax. No acute osseous abnormality. IMPRESSION: No active disease. Electronically Signed   By: Titus Dubin M.D.   On: 10/05/2018 14:39    Labs: BMET Recent Labs  Lab 09/29/18 1046 09/30/18 0203 10/01/18 0244 10/05/18 1920 10/06/18 0030  NA 135 137 135 135 137  K 5.2* 4.6 3.0* 4.3 3.3*  CL 97* 100 97* 98 97*  CO2 21* 21* 25 22 25   GLUCOSE 77 78 74 91 88  BUN 61* 74* 21* 71* 28*  CREATININE 7.85* 8.81* 4.51* 8.00* 4.13*  CALCIUM 8.9 8.6* 8.0* 8.6* 8.4*   CBC Recent Labs  Lab 09/29/18 1046  09/30/18 1816 10/01/18 0244 10/05/18 1418 10/06/18 0025  WBC 14.9*   < > 11.6* 8.6 8.8 7.7  NEUTROABS 12.9*  --   --   --  6.7  --   HGB 5.8*   < >  8.5* 7.9* 5.6* 9.1*  HCT 17.2*   < > 24.4* 23.2* 18.8* 26.2*  MCV 92.5   < > 88.4 89.9 105.0* 91.3  PLT 132*   < > 96* 82* 111* 86*   < > = values in this interval not displayed.    Medications:    . Chlorhexidine Gluconate Cloth  6 each Topical Q0600  . feeding supplement (NEPRO CARB STEADY)  237 mL Oral BID BM  . feeding supplement (PRO-STAT SUGAR FREE 64)  30 mL Oral BID  . multivitamin  1 tablet Oral QHS  . ondansetron (ZOFRAN) IV  4 mg Intravenous Q6H  . sucralfate  1 g Oral TID WC & HS      Otelia Santee, MD 10/06/2018, 7:39 AM

## 2018-10-06 NOTE — Progress Notes (Signed)
Patient has episodes of SVT when getting up to use bathroom. VSS. Will continue to monitor.

## 2018-10-07 DIAGNOSIS — K449 Diaphragmatic hernia without obstruction or gangrene: Secondary | ICD-10-CM

## 2018-10-07 LAB — CBC
HCT: 24.7 % — ABNORMAL LOW (ref 36.0–46.0)
Hemoglobin: 8.3 g/dL — ABNORMAL LOW (ref 12.0–15.0)
MCH: 31.7 pg (ref 26.0–34.0)
MCHC: 33.6 g/dL (ref 30.0–36.0)
MCV: 94.3 fL (ref 80.0–100.0)
Platelets: 98 10*3/uL — ABNORMAL LOW (ref 150–400)
RBC: 2.62 MIL/uL — ABNORMAL LOW (ref 3.87–5.11)
RDW: 18.8 % — ABNORMAL HIGH (ref 11.5–15.5)
WBC: 6.7 10*3/uL (ref 4.0–10.5)
nRBC: 0.3 % — ABNORMAL HIGH (ref 0.0–0.2)

## 2018-10-07 LAB — BASIC METABOLIC PANEL
Anion gap: 15 (ref 5–15)
BUN: 28 mg/dL — ABNORMAL HIGH (ref 6–20)
CO2: 25 mmol/L (ref 22–32)
Calcium: 8.4 mg/dL — ABNORMAL LOW (ref 8.9–10.3)
Chloride: 97 mmol/L — ABNORMAL LOW (ref 98–111)
Creatinine, Ser: 4.13 mg/dL — ABNORMAL HIGH (ref 0.44–1.00)
GFR calc Af Amer: 13 mL/min — ABNORMAL LOW (ref >60)
GFR calc non Af Amer: 11 mL/min — ABNORMAL LOW (ref >60)
Glucose, Bld: 88 mg/dL (ref 70–99)
Potassium: 3.3 mmol/L — ABNORMAL LOW (ref 3.5–5.1)
Sodium: 137 mmol/L (ref 135–145)

## 2018-10-07 LAB — RENAL FUNCTION PANEL
Albumin: 2.3 g/dL — ABNORMAL LOW (ref 3.5–5.0)
Anion gap: 8 (ref 5–15)
BUN: 14 mg/dL (ref 6–20)
CO2: 28 mmol/L (ref 22–32)
Calcium: 7.7 mg/dL — ABNORMAL LOW (ref 8.9–10.3)
Chloride: 101 mmol/L (ref 98–111)
Creatinine, Ser: 2.8 mg/dL — ABNORMAL HIGH (ref 0.44–1.00)
GFR calc Af Amer: 21 mL/min — ABNORMAL LOW (ref >60)
GFR calc non Af Amer: 18 mL/min — ABNORMAL LOW (ref >60)
Glucose, Bld: 84 mg/dL (ref 70–99)
Phosphorus: 1.3 mg/dL — ABNORMAL LOW (ref 2.5–4.6)
Potassium: 3.6 mmol/L (ref 3.5–5.1)
Sodium: 137 mmol/L (ref 135–145)

## 2018-10-07 MED ORDER — DOXERCALCIFEROL 4 MCG/2ML IV SOLN
INTRAVENOUS | Status: AC
  Administered 2018-10-07: 3 ug via INTRAVENOUS
  Filled 2018-10-07: qty 2

## 2018-10-07 MED ORDER — DARBEPOETIN ALFA 60 MCG/0.3ML IJ SOSY
PREFILLED_SYRINGE | INTRAMUSCULAR | Status: AC
  Administered 2018-10-07: 60 ug via INTRAVENOUS
  Filled 2018-10-07: qty 0.3

## 2018-10-07 NOTE — Progress Notes (Addendum)
[facility] Progress Note   Subjective: Says she wants to go home. She doesn't like hospital food. Denies overt blood loss via stool. C/O tenderness L AVF  Objective Vitals:   10/06/18 1500 10/06/18 1524 10/06/18 2023 10/07/18 0434  BP: (!) 96/47 121/63 116/88 112/72  Pulse: 87 89 89 77  Resp: 18 18 19 18   Temp:  97.9 F (36.6 C) 98 F (36.7 C) 97.9 F (36.6 C)  TempSrc:  Oral Oral Oral  SpO2: 100% 100% 100% 100%  Weight:   50.3 kg   Height:       Physical Exam General: Chronically ill appearing female in NAD Heart: S1,S2 RRR Lungs: CTAB Abdomen: S, NT, active BS Extremities: No LE edema Dialysis Access: RIJ TDC drsg CDI, L AVF swollen, tender to touch. + bruit  Additional Objective Labs: Basic Metabolic Panel: Recent Labs  Lab 10/01/18 0244 10/05/18 1920 10/06/18 0030  NA 135 135 137  K 3.0* 4.3 3.3*  CL 97* 98 97*  CO2 25 22 25   GLUCOSE 74 91 88  BUN 21* 71* 28*  CREATININE 4.51* 8.00* 4.13*  CALCIUM 8.0* 8.6* 8.4*   Liver Function Tests: Recent Labs  Lab 10/05/18 1920  AST 31  ALT 25  ALKPHOS 44  BILITOT 0.5  PROT 4.6*  ALBUMIN 2.0*   Recent Labs  Lab 10/05/18 1920  LIPASE 24   CBC: Recent Labs  Lab 10/01/18 0244 10/05/18 1418 10/06/18 0025 10/06/18 0949 10/06/18 2244  WBC 8.6 8.8 7.7 6.7 6.1  NEUTROABS  --  6.7  --   --   --   HGB 7.9* 5.6* 9.1* 8.3* 7.9*  HCT 23.2* 18.8* 26.2* 23.9* 23.0*  MCV 89.9 105.0* 91.3 90.5 92.0  PLT 82* 111* 86* 97* 95*   Blood Culture    Component Value Date/Time   SDES BLOOD RIGHT HAND 09/16/2018 0252   SPECREQUEST  09/16/2018 0252    BOTTLES DRAWN AEROBIC ONLY Blood Culture adequate volume   CULT  09/16/2018 0252    NO GROWTH 5 DAYS Performed at [facility], [place] [address], [place], [place] [address]    REPTSTATUS 09/21/2018 FINAL 09/16/2018 0252    Cardiac Enzymes: Recent Labs  Lab 10/05/18 1418  TROPONINI 0.04*   CBG: Recent Labs  Lab 10/05/18 1346  10/05/18 1439  GLUCAP 55* 141*   Iron Studies: No results for input(s): IRON, TIBC, TRANSFERRIN, FERRITIN in the last 72 hours. @lablastinr3 @ Studies/Results: Dg Chest Portable 1 View  Result Date: 10/05/2018 CLINICAL DATA:  Shortness of breath.  Hypotension. EXAM: PORTABLE CHEST 1 VIEW COMPARISON:  Chest x-ray dated September 15, 2018. FINDINGS: The patient is rotated to the right. Interval removal of the left internal jugular tunnel dialysis catheter with placement of a new right internal jugular tunnel dialysis catheter with tip in the proximal right atrium. Stable mild cardiomegaly. Normal mediastinal contours. Normal pulmonary vascularity. No focal consolidation, pleural effusion, or pneumothorax. No acute osseous abnormality. IMPRESSION: No active disease. Electronically Signed   By: Titus Dubin M.D.   On: 10/05/2018 14:39   Medications: . sodium chloride     . Chlorhexidine Gluconate Cloth  6 each Topical Q0600  . darbepoetin (ARANESP) injection - DIALYSIS  60 mcg Intravenous Q Sat-HD  . doxercalciferol  3 mcg Intravenous Q T,Th,Sa-HD  . feeding supplement (NEPRO CARB STEADY)  237 mL Oral BID BM  . feeding supplement (PRO-STAT SUGAR FREE 64)  30 mL Oral BID  . multivitamin  1 tablet Oral  QHS  . ondansetron (ZOFRAN) IV  4 mg Intravenous Q6H  . pantoprazole  40 mg Oral BID  . sucralfate  1 g Oral TID WC & HS    HD Orders: SGKG   3hr 45 min 180NRe 400/600 52.5 kg 2.0 K//2.5 Ca  (lowest EDW in past 2 weeks-53.4kg) Hectorol 17mcg IV TIW Mircera 94mcg given 09/21/2018 (she has been high on 150 q2w)  Background: 58 y.o.femaleESRD HD TTS cirrhosis p/w GIB. HD through a right chest tunneled catheter w/ left AVF  Infiltration. COVID-19 negative.Her EGD on 5/2 was notable for bleeding gastric ulcer, erosive nodular gastritis and reflux esophagitis. Given 2U PRBC 5/7.  1.) Acute blood loss anemia secondary to GI bleed. EGD 10/06/18. One small non-bleeding ulcer, hiatal hernia, no  bleeding noted. If further bleeding, may need colonoscopy which pt says she wants to have as OP. Follow HGB. NOTE: Started on carafate-ESRD patient can only use this drug for short periods of time D/T potential for aluminum toxicity. Should not be used longer than 2 weeks.  2.) ESRD-T,Th,S. HD today on schedule. Use 4.0 K bath. No heparin.  3.)  H/O hepatitis C with Cirrhosis-per primary 4.)  Hx of MSSA cath sepsis  5.) HTN/Volume-BP well controlled. No evidence of volume excess. Minimal UF with HD today. Antihypertensive meds on hold.  6.) SHPT-Ca 8.4 Check RFP today. Continue VDRA, binders on hold.  7. Nutrition-changed to renal diet with prostat, renal vit.  Albumin 2.0. Doesn't like hospital food, poor appetite.  Rita H. Brown NP-C 10/07/2018, 9:09 AM  [employer] [phone]  I have seen and examined this patient and agree with the plan of care. She is on schedule for HD today per TTS regmen.  Dwana Melena, MD 10/07/2018, 12:43 PM

## 2018-10-07 NOTE — Discharge Summary (Signed)
Physician Discharge Summary  Karen Morrow QAS:[id number] DOB: [birth year] DOA: 10/05/2018  PCP: [name], MD  Admit date: 10/05/2018 Discharge date: 10/07/2018  Admitted From: Home  Discharge disposition: Home   Recommendations for Outpatient Follow-Up:   Follow up with your primary care provider as scheduled by you.  Follow-up with GI as outpatient for outpatient colonoscopy.  Follow-up with your primary care physician within 1 week to check your blood work.  Dialysis as scheduled.   Discharge Diagnosis:   Active Problems:   Abdominal pain   Thrombocytopenia (HCC)   Hepatic cirrhosis (HCC)   HTN (hypertension)   Chronic diastolic (congestive) heart failure (HCC)   Symptomatic anemia   Chronic hepatitis C ([place])   GI bleed   Hiatal hernia    Discharge Condition: Improved.  Diet recommendation: Low sodium, heart healthy.    Wound care: None.  Code status: Full.   History of Present Illness:   Karen Morrow a 58 y.o.femalewith medical history significant ofend-stage renal disease on dialysis (on Tuesday, Thursday, Saturday), diastolic heart failure, hypertension, GERD, and recent history of MSSA bacteremia and sepsis, GI bleed secondary to reflux esophagitis and a gastric ulcer presented to the ED with symptomatic anemia with a hemoglobin of 5.6 and bloody and melanotic stools for one day. Patient reported mild generalized abdominal pain associated with some nausea. On arrival to emergency department patient was found to be afebrile, heart rate of 105/min, respiratory rate of 20/min and a blood pressure of 84/68. Labs were significant  for a hemoglobin of 5.6, normal WBC count, platelets of 111, MCV of 105 hematocrit of 18, INR of 1.2. Stool for occult blood was positive, COVID-19 screen was negative. Chest x-ray was negative.  Patient was started on IV Protonix and GI was consulted with plan for EGD 10/06/18.    Hospital Course:  Patient was  admitted to the hospital and following conditions were addressed during hospitalization,  Acute blood loss anemia likely secondary to upper GI bleed.  Hemoglobin of 5.6 in the ED yesterday and received packed RBC with improvement in hemoglobin.  Status post EGD on 10/06/2018 with findings of nonobstructive Schatzki ring in the lower third of the esophagus.  Nonbleeding gastric ulcer around 3 mm unchanged from last week, mild nonulcer gastritis.  Patient was given the option of inpatient colonoscopy but she has strongly refused.  She wishes to perform it as outpatient.  She denies further rectal bleed. Patient recently had an EGD on 09/30/2018 which showed non-bleeding gastric ulcer and esophagitis.     Thrombocytopenia. Likely secondary to chronic liver disease and cirrhosis of liver.  Will need monitoring as outpatient with periodic CBCs  End-stage renal disease on dialysis.  Patient was seen by nephrology and underwent hemodialysis.  History of recent MSSA bacteremia. Completed the course of cefepime on 10/02/2018.  No indication of infection at this time  Hypertension.    Resume home medications on discharge  Chronic hepatitis C, cirrhosis of liver. Outpatient follow-up with PCP/infectious disease/GI.  Disposition.  At this time, patient strongly wishes to be discharged home.  Patient was advised to seek medical attention if she had ongoing bleeding and was advised to follow-up with GI clinic as outpatient to discuss about outpatient colonoscopy.  Medical Consultants:    GI  Subjective:   Today, patient feels okay.  Denies any further rectal bleed.  Wishes to go home strongly and does not wish to proceed with colonoscopy despite prolonged conversation.  Denies any chest  pain, shortness of breath cough or fever.  Discharge Exam:   Vitals:   10/07/18 0434 10/07/18 0925  BP: 112/72 116/68  Pulse: 77 72  Resp: 18 18  Temp: 97.9 F (36.6 C) 98 F (36.7 C)  SpO2: 100% 100%   Vitals:    10/06/18 1524 10/06/18 2023 10/07/18 0434 10/07/18 0925  BP: 121/63 116/88 112/72 116/68  Pulse: 89 89 77 72  Resp: 18 19 18 18   Temp: 97.9 F (36.6 C) 98 F (36.7 C) 97.9 F (36.6 C) 98 F (36.7 C)  TempSrc: Oral Oral Oral Oral  SpO2: 100% 100% 100% 100%  Weight:  50.3 kg    Height:        General exam: Appears calm and comfortable ,Not in distress HEENT:PERRL,Oral mucosa moist.  Mild pallor noted Respiratory system: Bilateral equal air entry, normal vesicular breath sounds, no wheezes or crackles.  Right chest wall hemodialysis catheter in place. Cardiovascular system: S1 & S2 heard, RRR.  Gastrointestinal system: Abdomen is nondistended, soft and nontender. No organomegaly or masses felt. Normal bowel sounds heard. Central nervous system: Alert and oriented. No focal neurological deficits. Extremities: No edema, no clubbing ,no cyanosis, distal peripheral pulses palpable. Skin: No rashes, lesions or ulcers,no icterus ,no pallor MSK: Normal muscle bulk,tone ,power    Procedures:    EGD on 10/06/2018  The results of significant diagnostics from this hospitalization (including imaging, microbiology, ancillary and laboratory) are listed below for reference.     Diagnostic Studies:   Dg Chest Portable 1 View  Result Date: 10/05/2018 CLINICAL DATA:  Shortness of breath.  Hypotension. EXAM: PORTABLE CHEST 1 VIEW COMPARISON:  Chest x-ray dated September 15, 2018. FINDINGS: The patient is rotated to the right. Interval removal of the left internal jugular tunnel dialysis catheter with placement of a new right internal jugular tunnel dialysis catheter with tip in the proximal right atrium. Stable mild cardiomegaly. Normal mediastinal contours. Normal pulmonary vascularity. No focal consolidation, pleural effusion, or pneumothorax. No acute osseous abnormality. IMPRESSION: No active disease. Electronically Signed   By: Titus Dubin M.D.   On: 10/05/2018 14:39     Labs:   Basic  Metabolic Panel: Recent Labs  Lab 10/01/18 0244 10/05/18 1920 10/06/18 0030  NA 135 135 137  K 3.0* 4.3 3.3*  CL 97* 98 97*  CO2 25 22 25   GLUCOSE 74 91 88  BUN 21* 71* 28*  CREATININE 4.51* 8.00* 4.13*  CALCIUM 8.0* 8.6* 8.4*   GFR Estimated Creatinine Clearance: 11.8 mL/min (A) (by C-G formula based on SCr of 4.13 mg/dL (H)). Liver Function Tests: Recent Labs  Lab 10/05/18 1920  AST 31  ALT 25  ALKPHOS 44  BILITOT 0.5  PROT 4.6*  ALBUMIN 2.0*   Recent Labs  Lab 10/05/18 1920  LIPASE 24   No results for input(s): AMMONIA in the last 168 hours. Coagulation profile Recent Labs  Lab 10/05/18 1620  INR 1.2    CBC: Recent Labs  Lab 10/05/18 1418 10/06/18 0025 10/06/18 0949 10/06/18 2244 10/07/18 0946  WBC 8.8 7.7 6.7 6.1 6.7  NEUTROABS 6.7  --   --   --   --   HGB 5.6* 9.1* 8.3* 7.9* 8.3*  HCT 18.8* 26.2* 23.9* 23.0* 24.7*  MCV 105.0* 91.3 90.5 92.0 94.3  PLT 111* 86* 97* 95* 98*   Cardiac Enzymes: Recent Labs  Lab 10/05/18 1418  TROPONINI 0.04*   BNP: Invalid input(s): POCBNP CBG: Recent Labs  Lab 10/05/18 1346 10/05/18 1439  GLUCAP 55* 141*   D-Dimer No results for input(s): DDIMER in the last 72 hours. Hgb A1c No results for input(s): HGBA1C in the last 72 hours. Lipid Profile No results for input(s): CHOL, HDL, LDLCALC, TRIG, CHOLHDL, LDLDIRECT in the last 72 hours. Thyroid function studies No results for input(s): TSH, T4TOTAL, T3FREE, THYROIDAB in the last 72 hours.  Invalid input(s): FREET3 Anemia work up No results for input(s): VITAMINB12, FOLATE, FERRITIN, TIBC, IRON, RETICCTPCT in the last 72 hours. Microbiology Recent Results (from the past 240 hour(s))  SARS Coronavirus 2 (CEPHEID - Performed in [place] hospital lab), Hosp Order     Status: None   Collection Time: 10/05/18  2:48 PM  Result Value Ref Range Status   SARS Coronavirus 2 NEGATIVE NEGATIVE Final    Comment: (NOTE) If result is NEGATIVE SARS-CoV-2 target  nucleic acids are NOT DETECTED. The SARS-CoV-2 RNA is generally detectable in upper and lower  respiratory specimens during the acute phase of infection. The lowest  concentration of SARS-CoV-2 viral copies this assay can detect is 250  copies / mL. A negative result does not preclude SARS-CoV-2 infection  and should not be used as the sole basis for treatment or other  patient management decisions.  A negative result may occur with  improper specimen collection / handling, submission of specimen other  than nasopharyngeal swab, presence of viral mutation(s) within the  areas targeted by this assay, and inadequate number of viral copies  (<250 copies / mL). A negative result must be combined with clinical  observations, patient history, and epidemiological information. If result is POSITIVE SARS-CoV-2 target nucleic acids are DETECTED. The SARS-CoV-2 RNA is generally detectable in upper and lower  respiratory specimens dur ing the acute phase of infection.  Positive  results are indicative of active infection with SARS-CoV-2.  Clinical  correlation with patient history and other diagnostic information is  necessary to determine patient infection status.  Positive results do  not rule out bacterial infection or co-infection with other viruses. If result is PRESUMPTIVE POSTIVE SARS-CoV-2 nucleic acids MAY BE PRESENT.   A presumptive positive result was obtained on the submitted specimen  and confirmed on repeat testing.  While 2019 novel coronavirus  (SARS-CoV-2) nucleic acids may be present in the submitted sample  additional confirmatory testing may be necessary for epidemiological  and / or clinical management purposes  to differentiate between  SARS-CoV-2 and other Sarbecovirus currently known to infect humans.  If clinically indicated additional testing with an alternate test  methodology [phone]) is advised. The SARS-CoV-2 RNA is generally  detectable in upper and lower  respiratory sp ecimens during the acute  phase of infection. The expected result is Negative. Fact Sheet for Patients:  StrictlyIdeas.no Fact Sheet for Healthcare Providers: BankingDealers.co.za This test is not yet approved or cleared by the [place] FDA and has been authorized for detection and/or diagnosis of SARS-CoV-2 by FDA under an Emergency Use Authorization (EUA).  This EUA will remain in effect (meaning this test can be used) for the duration of the COVID-19 declaration under Section 564(b)(1) of the Act, 21 U.S.C. section 360bbb-3(b)(1), unless the authorization is terminated or revoked sooner. Performed at [facility], [place] [address] [place], [place] [address]   MRSA PCR Screening     Status: None   Collection Time: 10/06/18  2:04 AM  Result Value Ref Range Status   MRSA by PCR NEGATIVE NEGATIVE Final    Comment:  The GeneXpert MRSA Assay (FDA approved for NASAL specimens only), is one component of a comprehensive MRSA colonization surveillance program. It is not intended to diagnose MRSA infection nor to guide or monitor treatment for MRSA infections. Performed at [facility], [place] [address], [place], [place] [address]      Discharge Instructions:   Discharge Instructions    Diet - low sodium heart healthy   Complete by:  As directed    Discharge instructions   Complete by:  As directed    Please follow up with GI office to schedule for colonoscopy. Take protonix without interruption.  Do not take Motrin Aleve and other over-the-counter pain medications.  Please follow-up with your primary care physician as scheduled by you.  Continue hemodialysis as per schedule.  Seek immediate medical attention if you have worsening or persistence of rectal bleed.   Increase activity slowly   Complete by:  As directed      Allergies as of 10/07/2018      Reactions   Cefazolin Other (See Comments)    Severe thrombocytopenia (has tolerated zosyn in the past) ID aware Tolerated in October 2019      Medication List    STOP taking these medications   sucralfate 1 g tablet Commonly known as:  CARAFATE     TAKE these medications   acetaminophen 500 MG tablet Commonly known as:  TYLENOL Take 500-1,000 mg by mouth every 6 (six) hours as needed for headache (pain).   albuterol 108 (90 Base) MCG/ACT inhaler Commonly known as:  VENTOLIN HFA TAKE 2 PUFFS BY MOUTH EVERY 6 HOURS AS NEEDED FOR WHEEZE OR SHORTNESS OF BREATH What changed:  See the new instructions.   albuterol (2.5 MG/3ML) 0.083% nebulizer solution Commonly known as:  PROVENTIL Take 3 mLs (2.5 mg total) by nebulization every 6 (six) hours as needed for wheezing or shortness of breath. MUST MAKE APPT FOR FURTHER REFILLS What changed:  Another medication with the same name was changed. Make sure you understand how and when to take each.   amLODipine 10 MG tablet Commonly known as:  NORVASC Take 1 tablet (10 mg total) by mouth daily. What changed:  when to take this   Auryxia 1 GM 210 MG(Fe) tablet Generic drug:  ferric citrate Take 420 mg by mouth 3 (three) times daily with meals.   b complex-vitamin c-folic acid 0.8 MG Tabs tablet Take 1 tablet by mouth every evening.   cinacalcet 30 MG tablet Commonly known as:  SENSIPAR TAKE 1 TABLET BY MOUTH DAILY EVERY EVENING (DO NOT TAKE LESS THAN 12 HOURS PRIOR TO DIALYSIS)   feeding supplement (NEPRO CARB STEADY) Liqd Take 237 mLs by mouth 2 (two) times daily between meals.   feeding supplement (PRO-STAT SUGAR FREE 64) Liqd Take 30 mLs by mouth 2 (two) times daily.   metoprolol tartrate 25 MG tablet Commonly known as:  LOPRESSOR Take 25 mg by mouth 2 (two) times daily.   oxyCODONE-acetaminophen 5-325 MG tablet Commonly known as:  PERCOCET/ROXICET Take 1 tablet by mouth every 6 (six) hours as needed for severe pain.   pantoprazole 40 MG tablet Commonly known as:   PROTONIX Take 1 tablet (40 mg total) by mouth 2 (two) times daily.      Follow-up Information    Cirigliano, Vito V, DO. Schedule an appointment as soon as possible for a visit in 1 week(s).   Specialty:  Gastroenterology Why:  for colonoscopy Contact information: [address] [place] [address] [phone]          Time coordinating discharge: 39 minutes  Signed:  Clarinda Obi  [facility] 10/07/2018, 10:14 AM

## 2018-10-07 NOTE — TOC Transition Note (Signed)
Transition of Care [facility]) - CM/SW Discharge Note   Patient Details  Name: BERNADEAN SALING MRN: [id number] Date of Birth: 06-Dec-1960  [facility] CM/SW Contact:  Carles Collet, RN Phone Number: 10/07/2018, 10:46 AM   Clinical Narrative:   DC to home, self care. Requested CMA to schedule appointment w PCP Monday and notify patient as she is a 7 day readmission.      Barriers to Discharge: No Barriers Identified   Patient Goals and CMS Choice Patient states their goals for this hospitalization and ongoing recovery are:: to return home      Discharge Placement                       Discharge Plan and Services                                     Social Determinants of Health (SDOH) Interventions     Readmission Risk Interventions Readmission Risk Prevention Plan 10/07/2018 10/01/2018 10/01/2018  Transportation Screening Complete - Complete  Medication Review [employer]) Complete - Complete  PCP or Specialist appointment within 3-5 days of discharge Not Complete - Complete  PCP/Specialist Appt Not Complete comments unable to schedule appointment on weekend - -  [place] or Home Care Consult Complete Complete -  SW Recovery Care/Counseling Consult Complete - Complete  Palliative Care Screening Not Applicable - Not [place] Not Applicable - Not Applicable  Some recent data might be hidden

## 2018-10-07 NOTE — Progress Notes (Signed)
DISCHARGE NOTE SNF Karen Morrow to be discharged Home per MD order. Patient verbalized understanding.  Skin clean, dry and intact without evidence of skin break down, no evidence of skin tears noted. IV catheter discontinued intact. Site without signs and symptoms of complications. Dressing and pressure applied. Pt denies pain at the site currently. No complaints noted.  Patient free of lines, drains, and wounds.   Discharge packet assembled. An After Visit Summary (AVS) was printed and given to patient. Patient escorted via wheelchair and discharged tohome via private auto.  Babs Sciara, RN

## 2018-10-09 ENCOUNTER — Encounter: Payer: Self-pay | Admitting: *Deleted

## 2018-10-09 ENCOUNTER — Telehealth: Payer: Self-pay

## 2018-10-09 ENCOUNTER — Other Ambulatory Visit: Payer: Self-pay | Admitting: Physician Assistant

## 2018-10-09 ENCOUNTER — Other Ambulatory Visit: Payer: Self-pay | Admitting: *Deleted

## 2018-10-09 DIAGNOSIS — R0602 Shortness of breath: Secondary | ICD-10-CM

## 2018-10-09 NOTE — Telephone Encounter (Signed)
Spoke with patient regarding followup appt made with [facility] and wellness 5/18 at 1:30pm. Patient and patients husband are aware of appointment.

## 2018-10-09 NOTE — Patient Outreach (Addendum)
[facility]) Care Management  10/09/2018  Karen Morrow [birth year] [id number]   Transition of care call   Referral received: 09/18/18 Initial outreach: 09/21/18 Insurance: [place] Choice Plan   Subjective: In response to 4 previous voice mails requesting return call, patient's husband called this RNCM and then put his wife on the phone. 2 HIPAA identifiers verified. Explained purpose of call and completed transition of care assessment.  Karen Morrow states she is feeling much better, no longer bleeding from her "butt". She says her appetite is good and denies that she drinks a nutritional supplement.  She states her husband is her sole caregiver.  She states she does not weight at home because she is weighed at dialysis three times weekly. She also says she does not have a scale at home but says even if she did she would not weigh at home because of the weights taken at dialysis.. She is receptive to this RNCM mailing a scale to her home so that she can weigh on the days she does not go to dialysis. She states she does not difficulty affording her medications but that sometimes they struggle with food insecurity.      Objective:  Per the electronic medical record, Karen Morrow was hospitalized at [facility] from 4/17-4/21 for Methicillin-sensitive Staphylococcus aureus sepsis likely due to her dialysis catheter and symptomatic anemia with suspected subacute upper GI bleed. She was discharged to home on 09/19/18. She was to receive cefepime 2 gm IV at dialysis on Tuesday, Thursday and Saturday for 11 days.  On 09/26/18, she was admitted for elective surgery to have insertion of a dialysis tunneled catheter and discharged the same day.  On 5/1 she was admitted for acute lower GI bleeding, she required 3 units of  Blood as her admission Hgb was 5.8.  AN upper endoscopy on 5/2 showed reflux esophagitis and a non bleeding gastric ulcer and non bleeding duodenal  diverticulum. She was discharged on 5/3. On 5/7 she was again admitted for GI bleeding with admission Hgb = 5.6 and again required 2 units of blood. The upper endoscopy done on 5/8 showed non bleeding gastric ulcer. A small bowel enteroscopy done 5/8 was normal. She refused inpatient colonoscopy so provider suggested expedited outpatient colonoscopy in a hospital setting due to her numerous comorbidities. She was discharged to home on 10/07/18. Comorbidities include: chronic diastolic heart failure, HTN, cirrhosis of liver, GERD, gastric ulcer, ESRD on HD, Hepatitis C, malnutrition, and tobacco abuse.     Assessment:  Patient voices good understanding of all discharge instructions.  See transition of care flowsheet for assessment details.   Plan:  Reviewed hospital discharge diagnosis of GI bleed and comorbidity and history of heart failure. Reviewed treatment plan using hospital discharge instructions, assessing medication adherence and discussing the importance of follow up with primary care providers and specialists. Reinforced the instruction related to recording daily weights and when to call MD for weight gain. Advised Karen Morrow a scale will be mailed to her home.  Included the following Emmi education modules in the letter to patient: Gastrointestinal Bleeding;  Heart Healthy Diet;  Dialysis and Diet; and Heart Failure: Keeping Track of Your Weight Each Day.  Also mailed patient a list of the [place]. No ongoing care management needs identified so will close case to [facility] [facility] care management services and route successful outreach letter with [facility] pamphlet and 24 Hour Nurse Line  [place] to [facility] clinical pool to be mailed to patient's home address.  Barrington Ellison RN,CCM,CDE [facility] Coordinator Office Phone [phone] Office Fax  [phone]  Addendum: No scales available from [place] [facility] so encouraged patient to purchase scale and weigh at home per the Emmi education module Heart Failure:Keeping Track of Your Weight at Each Day .

## 2018-10-09 NOTE — Anesthesia Postprocedure Evaluation (Signed)
Anesthesia Post Note  Patient: Karen Morrow  Procedure(s) Performed: ENTEROSCOPY (N/A )     Patient location during evaluation: PACU Anesthesia Type: MAC Level of consciousness: awake and alert Pain management: pain level controlled Vital Signs Assessment: post-procedure vital signs reviewed and stable Respiratory status: spontaneous breathing, nonlabored ventilation, respiratory function stable and patient connected to nasal cannula oxygen Cardiovascular status: stable and blood pressure returned to baseline Postop Assessment: no apparent nausea or vomiting Anesthetic complications: no    Last Vitals:  Vitals:   10/07/18 1530 10/07/18 1543  BP: (!) 148/73 103/75  Pulse: 81 (!) 108  Resp: 16   Temp: 36.7 C   SpO2: 98%     Last Pain:  Vitals:   10/07/18 1530  TempSrc: Oral  PainSc: 0-No pain                 [name]

## 2018-10-10 DIAGNOSIS — R509 Fever, unspecified: Secondary | ICD-10-CM | POA: Diagnosis not present

## 2018-10-10 DIAGNOSIS — D631 Anemia in chronic kidney disease: Secondary | ICD-10-CM | POA: Diagnosis not present

## 2018-10-10 DIAGNOSIS — Z992 Dependence on renal dialysis: Secondary | ICD-10-CM | POA: Diagnosis not present

## 2018-10-10 DIAGNOSIS — N2581 Secondary hyperparathyroidism of renal origin: Secondary | ICD-10-CM | POA: Diagnosis not present

## 2018-10-10 DIAGNOSIS — N186 End stage renal disease: Secondary | ICD-10-CM | POA: Diagnosis not present

## 2018-10-10 DIAGNOSIS — D696 Thrombocytopenia, unspecified: Secondary | ICD-10-CM | POA: Diagnosis not present

## 2018-10-12 DIAGNOSIS — D696 Thrombocytopenia, unspecified: Secondary | ICD-10-CM | POA: Diagnosis not present

## 2018-10-12 DIAGNOSIS — R509 Fever, unspecified: Secondary | ICD-10-CM | POA: Diagnosis not present

## 2018-10-12 DIAGNOSIS — Z992 Dependence on renal dialysis: Secondary | ICD-10-CM | POA: Diagnosis not present

## 2018-10-12 DIAGNOSIS — N2581 Secondary hyperparathyroidism of renal origin: Secondary | ICD-10-CM | POA: Diagnosis not present

## 2018-10-12 DIAGNOSIS — D631 Anemia in chronic kidney disease: Secondary | ICD-10-CM | POA: Diagnosis not present

## 2018-10-12 DIAGNOSIS — N186 End stage renal disease: Secondary | ICD-10-CM | POA: Diagnosis not present

## 2018-10-14 DIAGNOSIS — D631 Anemia in chronic kidney disease: Secondary | ICD-10-CM | POA: Diagnosis not present

## 2018-10-14 DIAGNOSIS — N186 End stage renal disease: Secondary | ICD-10-CM | POA: Diagnosis not present

## 2018-10-14 DIAGNOSIS — R509 Fever, unspecified: Secondary | ICD-10-CM | POA: Diagnosis not present

## 2018-10-14 DIAGNOSIS — N2581 Secondary hyperparathyroidism of renal origin: Secondary | ICD-10-CM | POA: Diagnosis not present

## 2018-10-14 DIAGNOSIS — Z992 Dependence on renal dialysis: Secondary | ICD-10-CM | POA: Diagnosis not present

## 2018-10-14 DIAGNOSIS — D696 Thrombocytopenia, unspecified: Secondary | ICD-10-CM | POA: Diagnosis not present

## 2018-10-16 ENCOUNTER — Ambulatory Visit: Payer: Medicare Other | Attending: Primary Care | Admitting: Primary Care

## 2018-10-16 ENCOUNTER — Other Ambulatory Visit: Payer: Self-pay

## 2018-10-16 ENCOUNTER — Encounter: Payer: Self-pay | Admitting: Primary Care

## 2018-10-16 DIAGNOSIS — I132 Hypertensive heart and chronic kidney disease with heart failure and with stage 5 chronic kidney disease, or end stage renal disease: Secondary | ICD-10-CM | POA: Diagnosis not present

## 2018-10-16 DIAGNOSIS — D649 Anemia, unspecified: Secondary | ICD-10-CM | POA: Diagnosis not present

## 2018-10-16 DIAGNOSIS — D62 Acute posthemorrhagic anemia: Secondary | ICD-10-CM | POA: Insufficient documentation

## 2018-10-16 DIAGNOSIS — N186 End stage renal disease: Secondary | ICD-10-CM | POA: Insufficient documentation

## 2018-10-16 DIAGNOSIS — K254 Chronic or unspecified gastric ulcer with hemorrhage: Secondary | ICD-10-CM | POA: Insufficient documentation

## 2018-10-16 DIAGNOSIS — I5032 Chronic diastolic (congestive) heart failure: Secondary | ICD-10-CM | POA: Diagnosis not present

## 2018-10-16 DIAGNOSIS — Z992 Dependence on renal dialysis: Secondary | ICD-10-CM | POA: Diagnosis not present

## 2018-10-16 DIAGNOSIS — K922 Gastrointestinal hemorrhage, unspecified: Secondary | ICD-10-CM | POA: Diagnosis present

## 2018-10-16 DIAGNOSIS — Z09 Encounter for follow-up examination after completed treatment for conditions other than malignant neoplasm: Secondary | ICD-10-CM | POA: Diagnosis not present

## 2018-10-16 NOTE — Progress Notes (Signed)
Virtual Visit via Telephone Note  I connected with Karen Morrow on 10/16/18 at  11:30 PM EDT by telephone and verified that I am speaking with the correct person using two identifiers.   I discussed the limitations, risks, security and privacy concerns of performing an evaluation and management service by telephone and the availability of in person appointments. I also discussed with the patient that there may be a patient responsible charge related to this service. The patient expressed understanding and agreed to proceed.   History of Present Illness:   Karen Morrow was recently admitted to the hospital for Acute GI bleed/acute blood loss anemia. She received 2 units of PRBC and consulted by GI. Past medical history significant of end-stage renal disease on dialysis on Tuesdays, Thursdays Saturdays, diastolic heart failure, hypertension, GERD, and recent history of MSSA bacteremia and sepsis, GI bleed secondary to reflux esophagitis and a gastric ulcer.  Observations/Objective: 10/07/2018 Bp 116/68 , pulse 72, resp 18 temp 98 retrieved from hospital d/c Review of Systems  Constitutional: Positive for malaise/fatigue.  HENT: Negative.   Eyes: Negative.   Respiratory: Negative.   Cardiovascular: Negative.   Gastrointestinal: Positive for heartburn.  Genitourinary: Negative.   Musculoskeletal: Negative.   Skin: Negative.   Neurological: Negative.   Endo/Heme/Allergies: Negative.   Psychiatric/Behavioral: Negative.    Assessment and Plan: Karen Morrow was seen today for hospitalization follow-up.  Diagnoses and all orders for this visit:  Hospital discharge follow-up Acute Gastric bleed , EDG done during hospitalization consulted with GI, Nephrology   Gastrointestinal hemorrhage associated with gastric ulcer Significant hx follow up with GI  Symptomatic anemia Admitted with HGB 5.6 received 2 units of PRBC and EDG completed   End stage kidney disease ([place]) On dialysis Tuesdays,  Thursdays Saturdays followed by nephrology    Follow Up Instructions:    I discussed the assessment and treatment plan with the patient. The patient was provided an opportunity to ask questions and all were answered. The patient agreed with the plan and demonstrated an understanding of the instructions.   The patient was advised to call back or seek an in-person evaluation if the symptoms worsen or if the condition fails to improve as anticipated.  I provided 17 minutes of non-face-to-face time during this encounter.   Kerin Perna, NP

## 2018-10-16 NOTE — Progress Notes (Signed)
Patient verified DOB Patient has not taken medication. Patient has not eaten today. No bleeding since leaving the hospital and denies fever.

## 2018-10-18 ENCOUNTER — Other Ambulatory Visit: Payer: Self-pay

## 2018-10-18 ENCOUNTER — Ambulatory Visit: Payer: Medicare Other | Admitting: Gastroenterology

## 2018-10-18 ENCOUNTER — Emergency Department (HOSPITAL_COMMUNITY)
Admission: EM | Admit: 2018-10-18 | Discharge: 2018-10-18 | Disposition: A | Discharge status: Home / Self Care | Payer: Medicare Other | Attending: Emergency Medicine | Admitting: Emergency Medicine

## 2018-10-18 DIAGNOSIS — A09 Infectious gastroenteritis and colitis, unspecified: Secondary | ICD-10-CM | POA: Insufficient documentation

## 2018-10-18 DIAGNOSIS — I5031 Acute diastolic (congestive) heart failure: Secondary | ICD-10-CM | POA: Diagnosis not present

## 2018-10-18 DIAGNOSIS — I132 Hypertensive heart and chronic kidney disease with heart failure and with stage 5 chronic kidney disease, or end stage renal disease: Secondary | ICD-10-CM | POA: Insufficient documentation

## 2018-10-18 DIAGNOSIS — F17210 Nicotine dependence, cigarettes, uncomplicated: Secondary | ICD-10-CM | POA: Insufficient documentation

## 2018-10-18 DIAGNOSIS — Z79899 Other long term (current) drug therapy: Secondary | ICD-10-CM | POA: Diagnosis not present

## 2018-10-18 DIAGNOSIS — Z992 Dependence on renal dialysis: Secondary | ICD-10-CM | POA: Insufficient documentation

## 2018-10-18 DIAGNOSIS — N186 End stage renal disease: Secondary | ICD-10-CM | POA: Insufficient documentation

## 2018-10-18 DIAGNOSIS — R197 Diarrhea, unspecified: Secondary | ICD-10-CM | POA: Diagnosis present

## 2018-10-18 LAB — C DIFFICILE QUICK SCREEN W PCR REFLEX
C Diff antigen: POSITIVE — AB
C Diff toxin: NEGATIVE

## 2018-10-18 LAB — COMPREHENSIVE METABOLIC PANEL
ALT: 31 U/L (ref 0–44)
AST: 36 U/L (ref 15–41)
Albumin: 2.6 g/dL — ABNORMAL LOW (ref 3.5–5.0)
Alkaline Phosphatase: 98 U/L (ref 38–126)
Anion gap: 14 (ref 5–15)
BUN: 52 mg/dL — ABNORMAL HIGH (ref 6–20)
CO2: 21 mmol/L — ABNORMAL LOW (ref 22–32)
Calcium: 8.2 mg/dL — ABNORMAL LOW (ref 8.9–10.3)
Chloride: 100 mmol/L (ref 98–111)
Creatinine, Ser: 9.78 mg/dL — ABNORMAL HIGH (ref 0.44–1.00)
GFR calc Af Amer: 5 mL/min — ABNORMAL LOW (ref >60)
GFR calc non Af Amer: 4 mL/min — ABNORMAL LOW (ref >60)
Glucose, Bld: 81 mg/dL (ref 70–99)
Potassium: 3.9 mmol/L (ref 3.5–5.1)
Sodium: 135 mmol/L (ref 135–145)
Total Bilirubin: 0.8 mg/dL (ref 0.3–1.2)
Total Protein: 6.3 g/dL — ABNORMAL LOW (ref 6.5–8.1)

## 2018-10-18 LAB — CBC
HCT: 36.8 % (ref 36.0–46.0)
Hemoglobin: 11.9 g/dL — ABNORMAL LOW (ref 12.0–15.0)
MCH: 31.3 pg (ref 26.0–34.0)
MCHC: 32.3 g/dL (ref 30.0–36.0)
MCV: 96.8 fL (ref 80.0–100.0)
Platelets: 159 10*3/uL (ref 150–400)
RBC: 3.8 MIL/uL — ABNORMAL LOW (ref 3.87–5.11)
RDW: 15.2 % (ref 11.5–15.5)
WBC: 8.9 10*3/uL (ref 4.0–10.5)
nRBC: 0 % (ref 0.0–0.2)

## 2018-10-18 LAB — I-STAT BETA HCG BLOOD, ED (MC, WL, AP ONLY): I-stat hCG, quantitative: 11.9 m[IU]/mL — ABNORMAL HIGH (ref <5)

## 2018-10-18 LAB — MAGNESIUM: Magnesium: 2.2 mg/dL (ref 1.7–2.4)

## 2018-10-18 LAB — LIPASE, BLOOD: Lipase: 38 U/L (ref 11–51)

## 2018-10-18 LAB — CLOSTRIDIUM DIFFICILE BY PCR, REFLEXED: Toxigenic C. Difficile by PCR: NEGATIVE

## 2018-10-18 MED ORDER — SODIUM CHLORIDE 0.9% FLUSH: 3.0000 mL | Freq: Once | INTRAVENOUS | Status: DC

## 2018-10-18 MED ORDER — VANCOMYCIN HCL 125 MG PO CAPS: 125.0000 mg | ORAL_CAPSULE | Freq: Four times a day (QID) | ORAL | 0 refills | Status: AC

## 2018-10-18 NOTE — ED Triage Notes (Signed)
Pt BIB [facility] for c/o nausea and diarrhea.  Diarrhea x 24 hours. Pt reports one episode of emesis this AM.  Pt is a dialysis pt T, TH, Sat.  Missed treatment yesterday due to not feeling well. Pt reports more than 10 episodes of diarrhea within last 24 hours.   EMS Vitals:  BP 190/100 CBG 98 Temp 97.7

## 2018-10-18 NOTE — ED Notes (Signed)
Patient denies pain and is resting comfortably.  

## 2018-10-18 NOTE — ED Provider Notes (Addendum)
[facility] EMERGENCY DEPARTMENT Provider Note   CSN: [id number] Arrival date & time: 10/18/18  0930    History   Chief Complaint Chief Complaint  Patient presents with  . Emesis  . Diarrhea    HPI Karen Morrow is a 58 y.o. female with history of ESRD on dialysis Tuesday, Thursday, Saturday, diastolic heart failure, HTN, GERD, MSSA bacteremia and sepsis, recent admission 5/7-5/9 for GI bleed secondary to gastric ulcer requiring blood transfusion is here for evaluation of diarrhea.  Onset 2 days ago.  Yesterday she had 10-15 episodes of green, water like diarrhea.  Today she has had to have a bowel movement every 5 to 10 minutes.  Has some mild, transient cramping to her lower abdomen right before she has to have a bowel movement but no abdominal pain otherwise.  One episode of NBNB emesis this AM.  She denies any associated nausea, melena, coffee ground emesis, hematochezia.  Since discharge she has been feeling well.  No sick contacts at home with similar symptoms.  No recent exposure to suspected suspicious food.  No recent antibiotics.  No history of C. difficile.  No interventions.  No alleviating or aggravating factors.  Last time she went to HD was on Saturday, she missed HD yesterday due to frequency of diarrhea.     HPI  Past Medical History:  Diagnosis Date  . Anginal pain ([place])   . Asthma   . CHF (congestive heart failure) ([place])   . Chronic kidney disease    dialysis 3x wk  . Dyspnea   . Frequent bowel movements   . GERD (gastroesophageal reflux disease)   . Hepatitis C antibody test positive   . Hypertension    "just dx'd today" (11/03/2012)    Patient Active Problem List   Diagnosis Date Noted  . Hiatal hernia   . Gastrointestinal bleeding 09/29/2018  . GI bleed 09/29/2018  . Chronic hepatitis C ([place]) 09/23/2018  . Symptomatic anemia 09/16/2018  . Severe systemic inflammatory response syndrome (SIRS) ([place]) 09/16/2018  . Altered mental  status 09/16/2018  . Tobacco abuse 09/16/2018  . Chronic diastolic (congestive) heart failure ([place]) 07/07/2018  . Aortic insufficiency 07/07/2018  . Hypertensive urgency 05/13/2018  . Infection of arteriovenous fistula ([place]) 04/25/2018  . MSSA bacteremia 04/25/2018  . Bacteremia   . Staphylococcal sepsis ([place])   . Sepsis ([place]) 03/10/2018  . Hyperkalemia   . Acute respiratory distress   . Pulmonary hypertension due to left ventricular diastolic dysfunction ([place]) 11/08/2016  . Chronic cough 10/15/2016  . Acute GI bleeding 09/07/2016  . Gastroesophageal reflux disease 04/16/2016  . Anemia in chronic kidney disease, on chronic dialysis ([place]) 04/16/2016  . Primary insomnia 04/16/2016  . Shortness of breath 04/16/2016  . Pruritus 12/11/2014  . Hordeolum externum (stye) 12/11/2014  . Xanthelasma of right upper eyelid 12/11/2014  . Dermatitis 10/30/2014  . ESRD (end stage renal disease) ([place]) 02/25/2014  . Malnutrition of moderate degree ([place]) 11/28/2013  . HTN (hypertension) 11/26/2013  . Gastric ulcer 06/07/2013  . Abdominal pain 11/03/2012  . Chest pain 11/03/2012  . Thrombocytopenia ([place]) 11/03/2012  . Elevated transaminase level 11/03/2012  . Hepatic cirrhosis ([place]) 11/03/2012  . Hepatitis C antibody test positive     Past Surgical History:  Procedure Laterality Date  . ABDOMINAL HYSTERECTOMY     "partial" (11/03/2012)  . AV FISTULA PLACEMENT Right 12/03/2013   Procedure: CREATION OF ARTERIOVENOUS (AV) FISTULA RIGHT ARM ;  Surgeon: Rosetta Posner, MD;  Location: [facility] OR;  Service: Vascular;  Laterality: Right;  . AV FISTULA PLACEMENT Left 05/17/2018   Procedure: CREATION OF BRACHIOCEPHALIC FISTULA LEFT ARM;  Surgeon: Waynetta Sandy, MD;  Location: [facility];  Service: Vascular;  Laterality: Left;  . [place] REMOVAL Right 03/16/2018   Procedure: REMOVAL OF ARTERIOVENOUS GORETEX GRAFT ([place]) RIGHT ARM;  Surgeon: Waynetta Sandy, MD;  Location: [facility];  Service: Vascular;   Laterality: Right;  . BASCILIC VEIN TRANSPOSITION Right 03/06/2014   Procedure: SECOND STAGE BASILIC VEIN TRANSPOSITION;  Surgeon: Rosetta Posner, MD;  Location: [facility];  Service: Vascular;  Laterality: Right;  . BIOPSY  09/30/2018   Procedure: BIOPSY;  Surgeon: Mauri Pole, MD;  Location: [facility] ENDOSCOPY;  Service: Endoscopy;;  . ENTEROSCOPY N/A 10/06/2018   Procedure: ENTEROSCOPY;  Surgeon: Lavena Bullion, DO;  Location: [facility];  Service: Gastroenterology;  Laterality: N/A;  . ESOPHAGOGASTRODUODENOSCOPY N/A 06/05/2013   Procedure: ESOPHAGOGASTRODUODENOSCOPY (EGD);  Surgeon: Winfield Cunas., MD;  Location: [facility];  Service: Endoscopy;  Laterality: N/A;  . ESOPHAGOGASTRODUODENOSCOPY N/A 09/08/2016   Procedure: ESOPHAGOGASTRODUODENOSCOPY (EGD);  Surgeon: Milus Banister, MD;  Location: [facility];  Service: Endoscopy;  Laterality: N/A;  . ESOPHAGOGASTRODUODENOSCOPY (EGD) WITH PROPOFOL N/A 09/17/2018   Procedure: ESOPHAGOGASTRODUODENOSCOPY (EGD) WITH PROPOFOL;  Surgeon: Carol Ada, MD;  Location: [facility];  Service: Endoscopy;  Laterality: N/A;  . ESOPHAGOGASTRODUODENOSCOPY (EGD) WITH PROPOFOL N/A 09/30/2018   Procedure: ESOPHAGOGASTRODUODENOSCOPY (EGD) WITH PROPOFOL;  Surgeon: Mauri Pole, MD;  Location: [facility] ENDOSCOPY;  Service: Endoscopy;  Laterality: N/A;  . INSERTION OF DIALYSIS CATHETER Left 03/16/2018   Procedure: PLACEMENT OF TUNNEL DIALYSIS CATHETER;  Surgeon: Waynetta Sandy, MD;  Location: [facility];  Service: Vascular;  Laterality: Left;  . INSERTION OF DIALYSIS CATHETER Right 09/26/2018   Procedure: INSERTION OF DIALYSIS  TUNNELED CATHETER;  Surgeon: Elam Dutch, MD;  Location: [facility] OR;  Service: Vascular;  Laterality: Right;  . IR REMOVAL TUN CV CATH W/O FL  09/17/2018  . REVISON OF ARTERIOVENOUS FISTULA Right 01/28/2017   Procedure: REVISON OF RIGHT ARM ARTERIOVENOUS FISTULA USING 8MMX10CM GORETEX GRAFT;  Surgeon: Angelia Mould, MD;   Location: [facility];  Service: Vascular;  Laterality: Right;     OB History   No obstetric history on file.      Home Medications    Prior to Admission medications   Medication Sig Start Date End Date Taking? Authorizing Provider  acetaminophen (TYLENOL) 500 MG tablet Take 500-1,000 mg by mouth every 6 (six) hours as needed for headache (pain).   Yes [provider]  albuterol (PROVENTIL) (2.5 MG/3ML) 0.083% nebulizer solution Take 3 mLs (2.5 mg total) by nebulization every 6 (six) hours as needed for wheezing or shortness of breath. MUST MAKE APPT FOR FURTHER REFILLS 08/15/18  Yes [name], MD  Amino Acids-Protein Hydrolys (FEEDING SUPPLEMENT, PRO-STAT SUGAR FREE 64,) LIQD Take 30 mLs by mouth 2 (two) times daily. 09/19/18  Yes Hosie Poisson, MD  amLODipine (NORVASC) 10 MG tablet Take 1 tablet (10 mg total) by mouth daily. Patient taking differently: Take 10 mg by mouth at bedtime.  05/16/18  Yes Thurnell Lose, MD  b complex-vitamin c-folic acid (NEPHRO-VITE) 0.8 MG TABS tablet Take 1 tablet by mouth every evening. 07/27/18  Yes [provider]  calcium acetate (PHOSLO) 667 MG capsule Take 1,334 mg by mouth 3 (three) times daily with meals.   Yes [provider]  cinacalcet (SENSIPAR) 30 MG tablet TAKE 1  TABLET BY MOUTH DAILY EVERY EVENING (DO NOT TAKE LESS THAN 12 HOURS PRIOR TO DIALYSIS) Patient taking differently: Take 30 mg by mouth daily with supper. (DO NOT TAKE LESS THAN 12 HOURS PRIOR TO DIALYSIS) 09/19/18  Yes Hosie Poisson, MD  ferric citrate (AURYXIA) 1 GM 210 MG(Fe) tablet Take 420 mg by mouth 3 (three) times daily with meals.   Yes [provider]  pantoprazole (PROTONIX) 40 MG tablet Take 1 tablet (40 mg total) by mouth 2 (two) times daily. 10/01/18  Yes Mercy Riding, MD  sucralfate (CARAFATE) 1 g tablet Take 1 g by mouth 4 (four) times daily -  with meals and at bedtime.   Yes [provider]  albuterol (PROVENTIL HFA;VENTOLIN  HFA) 108 (90 Base) MCG/ACT inhaler TAKE 2 PUFFS BY MOUTH EVERY 6 HOURS AS NEEDED FOR WHEEZE OR SHORTNESS OF BREATH Patient not taking: Reported on 10/18/2018 05/09/18   [name], MD  metoprolol tartrate (LOPRESSOR) 25 MG tablet Take 25 mg by mouth 2 (two) times daily.    [provider]  Nutritional Supplements (FEEDING SUPPLEMENT, NEPRO CARB STEADY,) LIQD Take 237 mLs by mouth 2 (two) times daily between meals. Patient not taking: Reported on 10/09/2018 10/01/18   Mercy Riding, MD  oxyCODONE-acetaminophen (PERCOCET/ROXICET) 5-325 MG tablet Take 1 tablet by mouth every 6 (six) hours as needed for severe pain. Patient not taking: Reported on 10/09/2018 09/26/18   Elam Dutch, MD  vancomycin (VANCOCIN) 125 MG capsule Take 1 capsule (125 mg total) by mouth 4 (four) times daily for 10 days. 10/18/18 10/28/18  Kinnie Feil, PA-C    Family History Family History  Problem Relation Age of Onset  . Lupus Mother   . Diabetes Father   . Heart attack Father     Social History Social History   Tobacco Use  . Smoking status: Light Tobacco Smoker    Packs/day: 0.50    Years: 20.00    Pack years: 10.00    Types: Cigarettes  . Smokeless tobacco: Never Used  Substance Use Topics  . Alcohol use: Not Currently    Alcohol/week: 1.0 standard drinks    Types: 1 Glasses of wine per week  . Drug use: Not Currently    Types: Marijuana     Allergies   Cefazolin   Review of Systems Review of Systems  Gastrointestinal: Positive for diarrhea.  All other systems reviewed and are negative.    Physical Exam Updated Vital Signs BP (!) 128/91   Pulse (!) 110   Temp 98.9 F (37.2 C) (Oral)   Resp 12   Ht 5\' 7"  (1.702 m)   Wt 50.8 kg   SpO2 100%   BMI 17.54 kg/m   Physical Exam Vitals signs and nursing note reviewed.  Constitutional:      Appearance: She is well-developed.     Comments: Non toxic in NAD. Drinking water in room   HENT:     Head: Normocephalic and  atraumatic.     Nose: Nose normal.     Mouth/Throat:     Comments: MMM Eyes:     Conjunctiva/sclera: Conjunctivae normal.  Neck:     Musculoskeletal: Normal range of motion.  Cardiovascular:     Rate and Rhythm: Normal rate and regular rhythm.  Pulmonary:     Effort: Pulmonary effort is normal.     Breath sounds: Normal breath sounds.  Abdominal:     General: Bowel sounds are normal.  Palpations: Abdomen is soft.     Tenderness: There is no abdominal tenderness.     Comments: No G/R/R. No suprapubic or CVA tenderness. Negative Murphy's and McBurney's. Active BS to lower quadrants.   Musculoskeletal: Normal range of motion.  Skin:    General: Skin is warm and dry.     Capillary Refill: Capillary refill takes less than 2 seconds.     Comments: Right chest tunneled catheter; left AVF   Neurological:     Mental Status: She is alert.  Psychiatric:        Behavior: Behavior normal.      ED Treatments / Results  Labs (all labs ordered are listed, but only abnormal results are displayed) Labs Reviewed  C DIFFICILE QUICK SCREEN W PCR REFLEX - Abnormal; Notable for the following components:      Result Value   C Diff antigen POSITIVE (*)    All other components within normal limits  COMPREHENSIVE METABOLIC PANEL - Abnormal; Notable for the following components:   CO2 21 (*)    BUN 52 (*)    Creatinine, Ser 9.78 (*)    Calcium 8.2 (*)    Total Protein 6.3 (*)    Albumin 2.6 (*)    GFR calc non Af Amer 4 (*)    GFR calc Af Amer 5 (*)    All other components within normal limits  CBC - Abnormal; Notable for the following components:   RBC 3.80 (*)    Hemoglobin 11.9 (*)    All other components within normal limits  I-STAT BETA HCG BLOOD, ED ([facility], [facility], [facility] ONLY) - Abnormal; Notable for the following components:   I-stat hCG, quantitative 11.9 (*)    All other components within normal limits  CLOSTRIDIUM DIFFICILE BY PCR, REFLEXED  GASTROINTESTINAL PANEL BY PCR, STOOL  (REPLACES STOOL CULTURE)  LIPASE, BLOOD  MAGNESIUM    EKG None  Radiology No results found.  Procedures Procedures (including critical care time)  Medications Ordered in ED Medications - No data to display   Initial Impression / Assessment and Plan / ED Course  I have reviewed the triage vital signs and the nursing notes.  Pertinent labs & imaging results that were available during my care of the patient were reviewed by me and considered in my medical decision making (see chart for details).  Clinical Course as of Oct 17 2105  Wed Oct 18, 2018  1209 WBC: 8.9 [CG]  1209 Hemoglobin(!): 11.9 [CG]  1229 C Diff antigen(!): POSITIVE [CG]  1229 C Diff toxin: NEGATIVE [CG]  1229 Toxigenic C. Difficile by PCR: NEGATIVE [CG]  2499 58 year old female dialysis here with nausea and vomiting diarrhea.   [MB]    Clinical Course User Index [CG] Kinnie Feil, PA-C [MB] Hayden Rasmussen, MD      ddx includes infectious diarrhea including viral vs bacterial/c.diff. no sick contacts. No travel. No recent antibiotics but recent hospitalization and more than 10-15 BMs daily. Exam is very reassuring. No clinical signs of dehydration. No abd tenderness. She is drinking water in ED.  Will obtain labs to evaluate for electrolytes abnormalities, H/H. Recent hospitalization documentation reviewed by me.  1232: H/H stable. Electrolytes WNL. Creatinine/BUN as expected since missing dialysis yesterday.  No signs of fluid overload, CP, SOB. I spoke to Dr Jonnie Finner who has reviewed patient's chart, deems her appropriate to dc with HD tomorrow. Patient is planning on going to HD tomorrow.  C diff antigen positive, toxin negative, PCR negative.  Result indeterminate.  She has had 3 BM in ED and reports more than 10 at home in the last 24 hours.  Given this, recent hospitalization will treat with vancomycin for c.diff.  I discussed this with pharmacy who thinks it is reasonable to treat given  hospitalization and frequency of BM.  She has never had c. Diff in the past to explain positive antigen otherwise.  Will dc pt with strict return precautions, oral hydration and HD tomorrow.  Discussed with EDMD. Pt was comfortable with plan and eager to be discharged. Final Clinical Impressions(s) / ED Diagnoses   Final diagnoses:  Diarrhea of infectious origin    ED Discharge Orders         Ordered    vancomycin (VANCOCIN) 125 MG capsule  4 times daily     10/18/18 1222           Kinnie Feil, [place] 10/18/18 1237    Arlean Hopping 10/18/18 2107    Hayden Rasmussen, MD 10/19/18 1640

## 2018-10-18 NOTE — Discharge Instructions (Signed)
You were seen today for evaluation of diarrhea.  Your stool test was "indeterminate" for C. difficile diarrhea.  After discussion with pharmacy, we opted for treatment with vancomycin which is an antibiotic.  We opted for treatment today because of the frequency of diarrhea and your recent hospital exposure which puts you at risk for C. difficile.  Take antibiotic as prescribed.  Try and stay hydrated.  Go to dialysis tomorrow as scheduled.  Return to the ED for worsening diarrhea, abdominal pain, fever, chills, weakness.

## 2018-10-18 NOTE — ED Notes (Signed)
Pt up to BR with no need for (A).  Has been x 3 since admit to ER.   No acute changes, states she is ready to go home.

## 2018-10-18 NOTE — ED Notes (Signed)
Pt up to BR again.  Patient spoke with husband for update.  Waiting for MD

## 2018-10-18 NOTE — ED Notes (Signed)
IV attempt x 1 by this RN and attempt by PA using ultrasound.  Will wait to call IV team until labs resulted.  Pt alert without pain responses or lethargy.  Irritable but will allow treatment modalities.

## 2018-10-18 NOTE — ED Notes (Signed)
Pt reports recent admission due to GI bleed.

## 2018-10-19 DIAGNOSIS — D631 Anemia in chronic kidney disease: Secondary | ICD-10-CM | POA: Diagnosis not present

## 2018-10-19 DIAGNOSIS — D696 Thrombocytopenia, unspecified: Secondary | ICD-10-CM | POA: Diagnosis not present

## 2018-10-19 DIAGNOSIS — R509 Fever, unspecified: Secondary | ICD-10-CM | POA: Diagnosis not present

## 2018-10-19 DIAGNOSIS — N186 End stage renal disease: Secondary | ICD-10-CM | POA: Diagnosis not present

## 2018-10-19 DIAGNOSIS — Z992 Dependence on renal dialysis: Secondary | ICD-10-CM | POA: Diagnosis not present

## 2018-10-19 DIAGNOSIS — N2581 Secondary hyperparathyroidism of renal origin: Secondary | ICD-10-CM | POA: Diagnosis not present

## 2018-10-19 LAB — GASTROINTESTINAL PANEL BY PCR, STOOL (REPLACES STOOL CULTURE)

## 2018-10-20 NOTE — Telephone Encounter (Signed)
Requested Joycelyn Schmid reactivate the referral for Hep C in our workqueue.  Patient has been hospitalized (or recently released) each time I've checked her chart to reach out to her.

## 2018-10-21 DIAGNOSIS — D696 Thrombocytopenia, unspecified: Secondary | ICD-10-CM | POA: Diagnosis not present

## 2018-10-21 DIAGNOSIS — Z992 Dependence on renal dialysis: Secondary | ICD-10-CM | POA: Diagnosis not present

## 2018-10-21 DIAGNOSIS — N2581 Secondary hyperparathyroidism of renal origin: Secondary | ICD-10-CM | POA: Diagnosis not present

## 2018-10-21 DIAGNOSIS — N186 End stage renal disease: Secondary | ICD-10-CM | POA: Diagnosis not present

## 2018-10-21 DIAGNOSIS — D631 Anemia in chronic kidney disease: Secondary | ICD-10-CM | POA: Diagnosis not present

## 2018-10-21 DIAGNOSIS — R509 Fever, unspecified: Secondary | ICD-10-CM | POA: Diagnosis not present

## 2018-10-24 DIAGNOSIS — R509 Fever, unspecified: Secondary | ICD-10-CM | POA: Diagnosis not present

## 2018-10-24 DIAGNOSIS — N2581 Secondary hyperparathyroidism of renal origin: Secondary | ICD-10-CM | POA: Diagnosis not present

## 2018-10-24 DIAGNOSIS — Z992 Dependence on renal dialysis: Secondary | ICD-10-CM | POA: Diagnosis not present

## 2018-10-24 DIAGNOSIS — D696 Thrombocytopenia, unspecified: Secondary | ICD-10-CM | POA: Diagnosis not present

## 2018-10-24 DIAGNOSIS — N186 End stage renal disease: Secondary | ICD-10-CM | POA: Diagnosis not present

## 2018-10-24 DIAGNOSIS — D631 Anemia in chronic kidney disease: Secondary | ICD-10-CM | POA: Diagnosis not present

## 2018-10-26 DIAGNOSIS — N2581 Secondary hyperparathyroidism of renal origin: Secondary | ICD-10-CM | POA: Diagnosis not present

## 2018-10-26 DIAGNOSIS — D696 Thrombocytopenia, unspecified: Secondary | ICD-10-CM | POA: Diagnosis not present

## 2018-10-26 DIAGNOSIS — R509 Fever, unspecified: Secondary | ICD-10-CM | POA: Diagnosis not present

## 2018-10-26 DIAGNOSIS — Z992 Dependence on renal dialysis: Secondary | ICD-10-CM | POA: Diagnosis not present

## 2018-10-26 DIAGNOSIS — D631 Anemia in chronic kidney disease: Secondary | ICD-10-CM | POA: Diagnosis not present

## 2018-10-26 DIAGNOSIS — N186 End stage renal disease: Secondary | ICD-10-CM | POA: Diagnosis not present

## 2018-10-28 DIAGNOSIS — Z992 Dependence on renal dialysis: Secondary | ICD-10-CM | POA: Diagnosis not present

## 2018-10-28 DIAGNOSIS — R509 Fever, unspecified: Secondary | ICD-10-CM | POA: Diagnosis not present

## 2018-10-28 DIAGNOSIS — N186 End stage renal disease: Secondary | ICD-10-CM | POA: Diagnosis not present

## 2018-10-28 DIAGNOSIS — N2581 Secondary hyperparathyroidism of renal origin: Secondary | ICD-10-CM | POA: Diagnosis not present

## 2018-10-28 DIAGNOSIS — D696 Thrombocytopenia, unspecified: Secondary | ICD-10-CM | POA: Diagnosis not present

## 2018-10-28 DIAGNOSIS — D631 Anemia in chronic kidney disease: Secondary | ICD-10-CM | POA: Diagnosis not present

## 2018-10-30 DIAGNOSIS — I120 Hypertensive chronic kidney disease with stage 5 chronic kidney disease or end stage renal disease: Secondary | ICD-10-CM | POA: Diagnosis not present

## 2018-10-30 DIAGNOSIS — Z992 Dependence on renal dialysis: Secondary | ICD-10-CM | POA: Diagnosis not present

## 2018-10-30 DIAGNOSIS — N186 End stage renal disease: Secondary | ICD-10-CM | POA: Diagnosis not present

## 2018-10-31 DIAGNOSIS — N2581 Secondary hyperparathyroidism of renal origin: Secondary | ICD-10-CM | POA: Diagnosis not present

## 2018-10-31 DIAGNOSIS — Z992 Dependence on renal dialysis: Secondary | ICD-10-CM | POA: Diagnosis not present

## 2018-10-31 DIAGNOSIS — T80219A Unspecified infection due to central venous catheter, initial encounter: Secondary | ICD-10-CM | POA: Diagnosis not present

## 2018-10-31 DIAGNOSIS — N186 End stage renal disease: Secondary | ICD-10-CM | POA: Diagnosis not present

## 2018-10-31 DIAGNOSIS — D509 Iron deficiency anemia, unspecified: Secondary | ICD-10-CM | POA: Diagnosis not present

## 2018-10-31 DIAGNOSIS — D631 Anemia in chronic kidney disease: Secondary | ICD-10-CM | POA: Diagnosis not present

## 2018-11-04 DIAGNOSIS — N186 End stage renal disease: Secondary | ICD-10-CM | POA: Diagnosis not present

## 2018-11-04 DIAGNOSIS — N2581 Secondary hyperparathyroidism of renal origin: Secondary | ICD-10-CM | POA: Diagnosis not present

## 2018-11-04 DIAGNOSIS — D631 Anemia in chronic kidney disease: Secondary | ICD-10-CM | POA: Diagnosis not present

## 2018-11-04 DIAGNOSIS — T80219A Unspecified infection due to central venous catheter, initial encounter: Secondary | ICD-10-CM | POA: Diagnosis not present

## 2018-11-04 DIAGNOSIS — D509 Iron deficiency anemia, unspecified: Secondary | ICD-10-CM | POA: Diagnosis not present

## 2018-11-04 DIAGNOSIS — Z992 Dependence on renal dialysis: Secondary | ICD-10-CM | POA: Diagnosis not present

## 2018-11-07 DIAGNOSIS — Z992 Dependence on renal dialysis: Secondary | ICD-10-CM | POA: Diagnosis not present

## 2018-11-07 DIAGNOSIS — N2581 Secondary hyperparathyroidism of renal origin: Secondary | ICD-10-CM | POA: Diagnosis not present

## 2018-11-07 DIAGNOSIS — D509 Iron deficiency anemia, unspecified: Secondary | ICD-10-CM | POA: Diagnosis not present

## 2018-11-07 DIAGNOSIS — D631 Anemia in chronic kidney disease: Secondary | ICD-10-CM | POA: Diagnosis not present

## 2018-11-07 DIAGNOSIS — T80219A Unspecified infection due to central venous catheter, initial encounter: Secondary | ICD-10-CM | POA: Diagnosis not present

## 2018-11-07 DIAGNOSIS — N186 End stage renal disease: Secondary | ICD-10-CM | POA: Diagnosis not present

## 2018-11-09 DIAGNOSIS — Z992 Dependence on renal dialysis: Secondary | ICD-10-CM | POA: Diagnosis not present

## 2018-11-09 DIAGNOSIS — D631 Anemia in chronic kidney disease: Secondary | ICD-10-CM | POA: Diagnosis not present

## 2018-11-09 DIAGNOSIS — N2581 Secondary hyperparathyroidism of renal origin: Secondary | ICD-10-CM | POA: Diagnosis not present

## 2018-11-09 DIAGNOSIS — N186 End stage renal disease: Secondary | ICD-10-CM | POA: Diagnosis not present

## 2018-11-09 DIAGNOSIS — D509 Iron deficiency anemia, unspecified: Secondary | ICD-10-CM | POA: Diagnosis not present

## 2018-11-09 DIAGNOSIS — T80219A Unspecified infection due to central venous catheter, initial encounter: Secondary | ICD-10-CM | POA: Diagnosis not present

## 2018-11-11 DIAGNOSIS — D631 Anemia in chronic kidney disease: Secondary | ICD-10-CM | POA: Diagnosis not present

## 2018-11-11 DIAGNOSIS — T80219A Unspecified infection due to central venous catheter, initial encounter: Secondary | ICD-10-CM | POA: Diagnosis not present

## 2018-11-11 DIAGNOSIS — Z992 Dependence on renal dialysis: Secondary | ICD-10-CM | POA: Diagnosis not present

## 2018-11-11 DIAGNOSIS — N186 End stage renal disease: Secondary | ICD-10-CM | POA: Diagnosis not present

## 2018-11-11 DIAGNOSIS — D509 Iron deficiency anemia, unspecified: Secondary | ICD-10-CM | POA: Diagnosis not present

## 2018-11-11 DIAGNOSIS — N2581 Secondary hyperparathyroidism of renal origin: Secondary | ICD-10-CM | POA: Diagnosis not present

## 2018-11-14 DIAGNOSIS — D631 Anemia in chronic kidney disease: Secondary | ICD-10-CM | POA: Diagnosis not present

## 2018-11-14 DIAGNOSIS — Z992 Dependence on renal dialysis: Secondary | ICD-10-CM | POA: Diagnosis not present

## 2018-11-14 DIAGNOSIS — T80219A Unspecified infection due to central venous catheter, initial encounter: Secondary | ICD-10-CM | POA: Diagnosis not present

## 2018-11-14 DIAGNOSIS — N2581 Secondary hyperparathyroidism of renal origin: Secondary | ICD-10-CM | POA: Diagnosis not present

## 2018-11-14 DIAGNOSIS — D509 Iron deficiency anemia, unspecified: Secondary | ICD-10-CM | POA: Diagnosis not present

## 2018-11-14 DIAGNOSIS — N186 End stage renal disease: Secondary | ICD-10-CM | POA: Diagnosis not present

## 2018-11-16 DIAGNOSIS — D509 Iron deficiency anemia, unspecified: Secondary | ICD-10-CM | POA: Diagnosis not present

## 2018-11-16 DIAGNOSIS — Z992 Dependence on renal dialysis: Secondary | ICD-10-CM | POA: Diagnosis not present

## 2018-11-16 DIAGNOSIS — N186 End stage renal disease: Secondary | ICD-10-CM | POA: Diagnosis not present

## 2018-11-16 DIAGNOSIS — D631 Anemia in chronic kidney disease: Secondary | ICD-10-CM | POA: Diagnosis not present

## 2018-11-16 DIAGNOSIS — N2581 Secondary hyperparathyroidism of renal origin: Secondary | ICD-10-CM | POA: Diagnosis not present

## 2018-11-16 DIAGNOSIS — T80219A Unspecified infection due to central venous catheter, initial encounter: Secondary | ICD-10-CM | POA: Diagnosis not present

## 2018-11-18 DIAGNOSIS — T80219A Unspecified infection due to central venous catheter, initial encounter: Secondary | ICD-10-CM | POA: Diagnosis not present

## 2018-11-18 DIAGNOSIS — D631 Anemia in chronic kidney disease: Secondary | ICD-10-CM | POA: Diagnosis not present

## 2018-11-18 DIAGNOSIS — Z992 Dependence on renal dialysis: Secondary | ICD-10-CM | POA: Diagnosis not present

## 2018-11-18 DIAGNOSIS — N2581 Secondary hyperparathyroidism of renal origin: Secondary | ICD-10-CM | POA: Diagnosis not present

## 2018-11-18 DIAGNOSIS — N186 End stage renal disease: Secondary | ICD-10-CM | POA: Diagnosis not present

## 2018-11-18 DIAGNOSIS — D509 Iron deficiency anemia, unspecified: Secondary | ICD-10-CM | POA: Diagnosis not present

## 2018-11-21 DIAGNOSIS — N2581 Secondary hyperparathyroidism of renal origin: Secondary | ICD-10-CM | POA: Diagnosis not present

## 2018-11-21 DIAGNOSIS — Z992 Dependence on renal dialysis: Secondary | ICD-10-CM | POA: Diagnosis not present

## 2018-11-21 DIAGNOSIS — D509 Iron deficiency anemia, unspecified: Secondary | ICD-10-CM | POA: Diagnosis not present

## 2018-11-21 DIAGNOSIS — T80219A Unspecified infection due to central venous catheter, initial encounter: Secondary | ICD-10-CM | POA: Diagnosis not present

## 2018-11-21 DIAGNOSIS — N186 End stage renal disease: Secondary | ICD-10-CM | POA: Diagnosis not present

## 2018-11-21 DIAGNOSIS — D631 Anemia in chronic kidney disease: Secondary | ICD-10-CM | POA: Diagnosis not present

## 2018-11-23 DIAGNOSIS — N2581 Secondary hyperparathyroidism of renal origin: Secondary | ICD-10-CM | POA: Diagnosis not present

## 2018-11-23 DIAGNOSIS — D509 Iron deficiency anemia, unspecified: Secondary | ICD-10-CM | POA: Diagnosis not present

## 2018-11-23 DIAGNOSIS — T80219A Unspecified infection due to central venous catheter, initial encounter: Secondary | ICD-10-CM | POA: Diagnosis not present

## 2018-11-23 DIAGNOSIS — D631 Anemia in chronic kidney disease: Secondary | ICD-10-CM | POA: Diagnosis not present

## 2018-11-23 DIAGNOSIS — Z992 Dependence on renal dialysis: Secondary | ICD-10-CM | POA: Diagnosis not present

## 2018-11-23 DIAGNOSIS — N186 End stage renal disease: Secondary | ICD-10-CM | POA: Diagnosis not present

## 2018-11-27 ENCOUNTER — Observation Stay (HOSPITAL_COMMUNITY)
Admission: EM | Admit: 2018-11-27 | Discharge: 2018-11-27 | Disposition: A | Discharge status: Home / Self Care | Payer: Medicare Other | Attending: Internal Medicine | Admitting: Internal Medicine

## 2018-11-27 ENCOUNTER — Other Ambulatory Visit: Payer: Self-pay

## 2018-11-27 ENCOUNTER — Emergency Department (HOSPITAL_COMMUNITY): Payer: Medicare Other

## 2018-11-27 ENCOUNTER — Encounter (HOSPITAL_COMMUNITY): Payer: Self-pay | Admitting: Emergency Medicine

## 2018-11-27 DIAGNOSIS — K254 Chronic or unspecified gastric ulcer with hemorrhage: Secondary | ICD-10-CM | POA: Diagnosis not present

## 2018-11-27 DIAGNOSIS — Z8711 Personal history of peptic ulcer disease: Secondary | ICD-10-CM | POA: Insufficient documentation

## 2018-11-27 DIAGNOSIS — I160 Hypertensive urgency: Secondary | ICD-10-CM | POA: Diagnosis present

## 2018-11-27 DIAGNOSIS — E875 Hyperkalemia: Secondary | ICD-10-CM | POA: Diagnosis not present

## 2018-11-27 DIAGNOSIS — I5033 Acute on chronic diastolic (congestive) heart failure: Secondary | ICD-10-CM | POA: Diagnosis present

## 2018-11-27 DIAGNOSIS — R0902 Hypoxemia: Secondary | ICD-10-CM | POA: Insufficient documentation

## 2018-11-27 DIAGNOSIS — N289 Disorder of kidney and ureter, unspecified: Secondary | ICD-10-CM

## 2018-11-27 DIAGNOSIS — J9601 Acute respiratory failure with hypoxia: Secondary | ICD-10-CM | POA: Diagnosis present

## 2018-11-27 DIAGNOSIS — Z1159 Encounter for screening for other viral diseases: Secondary | ICD-10-CM | POA: Insufficient documentation

## 2018-11-27 DIAGNOSIS — Z79899 Other long term (current) drug therapy: Secondary | ICD-10-CM | POA: Diagnosis not present

## 2018-11-27 DIAGNOSIS — J90 Pleural effusion, not elsewhere classified: Secondary | ICD-10-CM | POA: Insufficient documentation

## 2018-11-27 DIAGNOSIS — I120 Hypertensive chronic kidney disease with stage 5 chronic kidney disease or end stage renal disease: Secondary | ICD-10-CM | POA: Diagnosis not present

## 2018-11-27 DIAGNOSIS — Z992 Dependence on renal dialysis: Secondary | ICD-10-CM | POA: Diagnosis not present

## 2018-11-27 DIAGNOSIS — E8770 Fluid overload, unspecified: Secondary | ICD-10-CM | POA: Diagnosis present

## 2018-11-27 DIAGNOSIS — I132 Hypertensive heart and chronic kidney disease with heart failure and with stage 5 chronic kidney disease, or end stage renal disease: Secondary | ICD-10-CM | POA: Insufficient documentation

## 2018-11-27 DIAGNOSIS — Z8249 Family history of ischemic heart disease and other diseases of the circulatory system: Secondary | ICD-10-CM | POA: Diagnosis not present

## 2018-11-27 DIAGNOSIS — J45909 Unspecified asthma, uncomplicated: Secondary | ICD-10-CM | POA: Insufficient documentation

## 2018-11-27 DIAGNOSIS — K7460 Unspecified cirrhosis of liver: Secondary | ICD-10-CM | POA: Insufficient documentation

## 2018-11-27 DIAGNOSIS — K219 Gastro-esophageal reflux disease without esophagitis: Secondary | ICD-10-CM | POA: Diagnosis not present

## 2018-11-27 DIAGNOSIS — B182 Chronic viral hepatitis C: Secondary | ICD-10-CM | POA: Diagnosis not present

## 2018-11-27 DIAGNOSIS — F17210 Nicotine dependence, cigarettes, uncomplicated: Secondary | ICD-10-CM | POA: Diagnosis not present

## 2018-11-27 DIAGNOSIS — D72829 Elevated white blood cell count, unspecified: Secondary | ICD-10-CM | POA: Diagnosis not present

## 2018-11-27 DIAGNOSIS — I10 Essential (primary) hypertension: Secondary | ICD-10-CM | POA: Diagnosis present

## 2018-11-27 DIAGNOSIS — K259 Gastric ulcer, unspecified as acute or chronic, without hemorrhage or perforation: Secondary | ICD-10-CM | POA: Diagnosis present

## 2018-11-27 DIAGNOSIS — R000 Tachycardia, unspecified: Secondary | ICD-10-CM | POA: Diagnosis not present

## 2018-11-27 DIAGNOSIS — Z9119 Patient's noncompliance with other medical treatment and regimen: Secondary | ICD-10-CM | POA: Diagnosis not present

## 2018-11-27 DIAGNOSIS — R0689 Other abnormalities of breathing: Secondary | ICD-10-CM | POA: Diagnosis not present

## 2018-11-27 DIAGNOSIS — E8779 Other fluid overload: Secondary | ICD-10-CM | POA: Diagnosis not present

## 2018-11-27 DIAGNOSIS — N186 End stage renal disease: Secondary | ICD-10-CM

## 2018-11-27 DIAGNOSIS — R069 Unspecified abnormalities of breathing: Secondary | ICD-10-CM | POA: Diagnosis not present

## 2018-11-27 DIAGNOSIS — D631 Anemia in chronic kidney disease: Secondary | ICD-10-CM | POA: Insufficient documentation

## 2018-11-27 DIAGNOSIS — Z720 Tobacco use: Secondary | ICD-10-CM | POA: Diagnosis present

## 2018-11-27 DIAGNOSIS — Z209 Contact with and (suspected) exposure to unspecified communicable disease: Secondary | ICD-10-CM | POA: Diagnosis not present

## 2018-11-27 DIAGNOSIS — R0602 Shortness of breath: Secondary | ICD-10-CM | POA: Diagnosis not present

## 2018-11-27 HISTORY — DX: Acute respiratory failure with hypoxia: J96.01

## 2018-11-27 HISTORY — DX: Acute on chronic diastolic (congestive) heart failure: I50.33

## 2018-11-27 LAB — RENAL FUNCTION PANEL
Albumin: 2.9 g/dL — ABNORMAL LOW (ref 3.5–5.0)
Anion gap: 17 — ABNORMAL HIGH (ref 5–15)
BUN: 40 mg/dL — ABNORMAL HIGH (ref 6–20)
CO2: 20 mmol/L — ABNORMAL LOW (ref 22–32)
Calcium: 8.9 mg/dL (ref 8.9–10.3)
Chloride: 101 mmol/L (ref 98–111)
Creatinine, Ser: 9.22 mg/dL — ABNORMAL HIGH (ref 0.44–1.00)
GFR calc Af Amer: 5 mL/min — ABNORMAL LOW (ref >60)
GFR calc non Af Amer: 4 mL/min — ABNORMAL LOW (ref >60)
Glucose, Bld: 118 mg/dL — ABNORMAL HIGH (ref 70–99)
Phosphorus: 8.2 mg/dL — ABNORMAL HIGH (ref 2.5–4.6)
Potassium: 5.8 mmol/L — ABNORMAL HIGH (ref 3.5–5.1)
Sodium: 138 mmol/L (ref 135–145)

## 2018-11-27 LAB — BASIC METABOLIC PANEL
Anion gap: 18 — ABNORMAL HIGH (ref 5–15)
BUN: 39 mg/dL — ABNORMAL HIGH (ref 6–20)
CO2: 20 mmol/L — ABNORMAL LOW (ref 22–32)
Calcium: 8.9 mg/dL (ref 8.9–10.3)
Chloride: 100 mmol/L (ref 98–111)
Creatinine, Ser: 9.33 mg/dL — ABNORMAL HIGH (ref 0.44–1.00)
GFR calc Af Amer: 5 mL/min — ABNORMAL LOW (ref >60)
GFR calc non Af Amer: 4 mL/min — ABNORMAL LOW (ref >60)
Glucose, Bld: 125 mg/dL — ABNORMAL HIGH (ref 70–99)
Potassium: 5.6 mmol/L — ABNORMAL HIGH (ref 3.5–5.1)
Sodium: 138 mmol/L (ref 135–145)

## 2018-11-27 LAB — CBC WITH DIFFERENTIAL/PLATELET
Abs Immature Granulocytes: 0.08 10*3/uL — ABNORMAL HIGH (ref 0.00–0.07)
Basophils Absolute: 0.1 10*3/uL (ref 0.0–0.1)
Basophils Relative: 1 %
Eosinophils Absolute: 0.4 10*3/uL (ref 0.0–0.5)
Eosinophils Relative: 3 %
HCT: 40.2 % (ref 36.0–46.0)
Hemoglobin: 12.7 g/dL (ref 12.0–15.0)
Immature Granulocytes: 1 %
Lymphocytes Relative: 36 %
Lymphs Abs: 4.5 10*3/uL — ABNORMAL HIGH (ref 0.7–4.0)
MCH: 28.7 pg (ref 26.0–34.0)
MCHC: 31.6 g/dL (ref 30.0–36.0)
MCV: 90.7 fL (ref 80.0–100.0)
Monocytes Absolute: 0.9 10*3/uL (ref 0.1–1.0)
Monocytes Relative: 7 %
Neutro Abs: 6.4 10*3/uL (ref 1.7–7.7)
Neutrophils Relative %: 52 %
Platelets: 229 10*3/uL (ref 150–400)
RBC: 4.43 MIL/uL (ref 3.87–5.11)
RDW: 15.2 % (ref 11.5–15.5)
WBC: 12.3 10*3/uL — ABNORMAL HIGH (ref 4.0–10.5)
nRBC: 0 % (ref 0.0–0.2)

## 2018-11-27 LAB — POCT I-STAT EG7
Acid-base deficit: 6 mmol/L — ABNORMAL HIGH (ref 0.0–2.0)
Bicarbonate: 21.3 mmol/L (ref 20.0–28.0)
Calcium, Ion: 1.04 mmol/L — ABNORMAL LOW (ref 1.15–1.40)
HCT: 42 % (ref 36.0–46.0)
Hemoglobin: 14.3 g/dL (ref 12.0–15.0)
O2 Saturation: 77 %
Patient temperature: 37
Potassium: 5.1 mmol/L (ref 3.5–5.1)
Sodium: 137 mmol/L (ref 135–145)
TCO2: 23 mmol/L (ref 22–32)
pCO2, Ven: 49 mmHg (ref 44.0–60.0)
pH, Ven: 7.246 — ABNORMAL LOW (ref 7.250–7.430)
pO2, Ven: 48 mmHg — ABNORMAL HIGH (ref 32.0–45.0)

## 2018-11-27 LAB — SARS CORONAVIRUS 2 BY RT PCR (HOSPITAL ORDER, PERFORMED IN ~~LOC~~ HOSPITAL LAB): SARS Coronavirus 2: NEGATIVE

## 2018-11-27 MED ORDER — SODIUM POLYSTYRENE SULFONATE 15 GM/60ML PO SUSP: 15.0000 g | Freq: Once | ORAL | Status: DC

## 2018-11-27 MED ORDER — CHLORHEXIDINE GLUCONATE CLOTH 2 % EX PADS: 6.0000 | MEDICATED_PAD | Freq: Every day | CUTANEOUS | Status: DC

## 2018-11-27 MED ORDER — PANTOPRAZOLE SODIUM 40 MG PO TBEC: 40.0000 mg | DELAYED_RELEASE_TABLET | Freq: Two times a day (BID) | ORAL | Status: DC

## 2018-11-27 MED ORDER — SODIUM CHLORIDE 0.9 % IV SOLN: 100.0000 mL | INTRAVENOUS | Status: DC | PRN

## 2018-11-27 MED ORDER — NITROGLYCERIN 0.4 MG SL SUBL
0.4000 mg | SUBLINGUAL_TABLET | SUBLINGUAL | Status: DC | PRN
  Administered 2018-11-27: 0.4 mg via SUBLINGUAL
  Filled 2018-11-27: qty 1

## 2018-11-27 MED ORDER — HEPARIN SODIUM (PORCINE) 1000 UNIT/ML DIALYSIS: 1000.0000 [IU] | INTRAMUSCULAR | Status: DC | PRN

## 2018-11-27 MED ORDER — AMLODIPINE BESYLATE 10 MG PO TABS: 10.0000 mg | ORAL_TABLET | Freq: Every day | ORAL | Status: DC

## 2018-11-27 MED ORDER — ALTEPLASE 2 MG IJ SOLR: 2.0000 mg | Freq: Once | INTRAMUSCULAR | Status: DC | PRN

## 2018-11-27 MED ORDER — SUCRALFATE 1 G PO TABS: 1.0000 g | ORAL_TABLET | Freq: Three times a day (TID) | ORAL | Status: DC

## 2018-11-27 MED ORDER — PENTAFLUOROPROP-TETRAFLUOROETH EX AERO: 1.0000 "application " | INHALATION_SPRAY | CUTANEOUS | Status: DC | PRN

## 2018-11-27 MED ORDER — ONDANSETRON HCL 4 MG/2ML IJ SOLN: 4.0000 mg | Freq: Three times a day (TID) | INTRAMUSCULAR | Status: DC | PRN

## 2018-11-27 MED ORDER — HEPARIN SODIUM (PORCINE) 1000 UNIT/ML IJ SOLN
INTRAMUSCULAR | Status: AC
  Filled 2018-11-27: qty 4

## 2018-11-27 MED ORDER — NICOTINE 21 MG/24HR TD PT24
21.0000 mg | MEDICATED_PATCH | Freq: Every day | TRANSDERMAL | Status: DC
  Administered 2018-11-27: 21 mg via TRANSDERMAL
  Filled 2018-11-27: qty 1

## 2018-11-27 MED ORDER — PRO-STAT SUGAR FREE PO LIQD: 30.0000 mL | Freq: Two times a day (BID) | ORAL | Status: DC

## 2018-11-27 MED ORDER — CALCIUM ACETATE (PHOS BINDER) 667 MG PO CAPS
1334.0000 mg | ORAL_CAPSULE | Freq: Three times a day (TID) | ORAL | Status: DC
  Filled 2018-11-27 (×3): qty 2

## 2018-11-27 MED ORDER — METOPROLOL TARTRATE 25 MG PO TABS: 25.0000 mg | ORAL_TABLET | Freq: Two times a day (BID) | ORAL | Status: DC

## 2018-11-27 MED ORDER — DOXERCALCIFEROL 4 MCG/2ML IV SOLN
1.0000 ug | INTRAVENOUS | Status: DC
  Filled 2018-11-27: qty 2

## 2018-11-27 MED ORDER — RENA-VITE PO TABS: 1.0000 | ORAL_TABLET | Freq: Every day | ORAL | Status: DC

## 2018-11-27 MED ORDER — ALBUTEROL SULFATE (2.5 MG/3ML) 0.083% IN NEBU: 3.0000 mL | INHALATION_SOLUTION | RESPIRATORY_TRACT | Status: DC | PRN

## 2018-11-27 MED ORDER — NITROGLYCERIN IN D5W 200-5 MCG/ML-% IV SOLN
0.0000 ug/min | INTRAVENOUS | Status: DC
  Administered 2018-11-27: 5 ug/min via INTRAVENOUS
  Filled 2018-11-27: qty 250

## 2018-11-27 MED ORDER — HEPARIN SODIUM (PORCINE) 5000 UNIT/ML IJ SOLN: 5000.0000 [IU] | Freq: Three times a day (TID) | INTRAMUSCULAR | Status: DC

## 2018-11-27 MED ORDER — ACETAMINOPHEN 325 MG PO TABS: 650.0000 mg | ORAL_TABLET | Freq: Four times a day (QID) | ORAL | Status: DC | PRN

## 2018-11-27 MED ORDER — CINACALCET HCL 30 MG PO TABS
30.0000 mg | ORAL_TABLET | Freq: Every day | ORAL | Status: DC
  Filled 2018-11-27: qty 1

## 2018-11-27 MED ORDER — LIDOCAINE-PRILOCAINE 2.5-2.5 % EX CREA: 1.0000 "application " | TOPICAL_CREAM | CUTANEOUS | Status: DC | PRN

## 2018-11-27 MED ORDER — FERRIC CITRATE 1 GM 210 MG(FE) PO TABS
420.0000 mg | ORAL_TABLET | Freq: Three times a day (TID) | ORAL | Status: DC
  Filled 2018-11-27 (×3): qty 2

## 2018-11-27 MED ORDER — LIDOCAINE HCL (PF) 1 % IJ SOLN: 5.0000 mL | INTRAMUSCULAR | Status: DC | PRN

## 2018-11-27 NOTE — Progress Notes (Signed)
Karen Morrow is a 58 Y/O female with ESRD on hemodialysis MWF at [facility]. Missed HD 11/25/18. Presented to ED 11/27/2018 with C/O SOB. She has been admitted as observation patient acute respiratory failure D/T volume overload. We will manage HD today and will consult formally if patient upgraded to in-patient status.   HD orders: [place] T,Th,S 3 hr 45 min 180NRe 350/600 50.5 kg 2.0K/ 2.5 Ca L AVF  -Hectorol 1 mcg IV TIW -Mircera 200 mcg IV q 2 weeks (last dose 11/14/18) -Venofer 50 mg IV  weekly  Juanell Fairly NP-C [place] [phone] (Pager)

## 2018-11-27 NOTE — ED Notes (Addendum)
Pt placed on NRB @ 15L O2 sat 93

## 2018-11-27 NOTE — ED Provider Notes (Signed)
0700: Assumed care of patient from Quincy Carnes PA-C at change of shift pending nephrology consult.   Patient has hx or ESRD, dialyzes T/R/Sat. Missed dialysis Saturday. Presented to the ED w/ dyspnea that began when she woke from sleep.Hyoxic to 86% on RA w/ EMS.  Presentation & work-up consistent w/ fluid overload in setting of missed dialysis session. CXR w/ findings consistent w/ fluid overload. K 5.6. She was placed on bipap & given NTG with improvement. Admitted to hospitalist service.   07:18: CONSULT: Discussed with nephrologist Dr. Justin Mend, will plan for dialysis today. Appreciate consultation.      Leafy Kindle 11/27/18 6333    Fatima Blank, MD 11/28/18 [phone]

## 2018-11-27 NOTE — ED Notes (Signed)
ED TO INPATIENT HANDOFF REPORT  ED Nurse Name and Phone #:  [phone] Threasa Beards, RN  S Name/Age/Gender Karen Morrow 58 y.o. female Room/Bed: 028C/028C  Code Status   Code Status: Full Code  Home/SNF/Other Home Patient oriented to: self, place, time and situation Is this baseline? Yes   Triage Complete: Triage complete  Chief Complaint SOB  Triage Note Pt presents from home by [facility]. Pt c/o SOB upon waking. Missed dialysis sat, did not take meds today. Hypertensive and hypoxic w/ EMS.   Allergies Allergies  Allergen Reactions  . Cefazolin Other (See Comments)    Severe thrombocytopenia (has tolerated zosyn in the past) ID aware Tolerated in October 2019    Level of Care/Admitting Diagnosis ED Disposition    ED Disposition Condition [place]: [place] [100100]  Level of Care: Progressive [102]  I expect the patient will be discharged within 24 hours: No (not a candidate for 5C-Observation unit)  Covid Evaluation: Screening Protocol (No Symptoms)  Diagnosis: Fluid overload [546270]  Admitting Physician: Ivor Costa [[id number]]  Attending Physician: Ivor Costa [[id number]]  PT Class (Do Not Modify): Observation [104]  PT Acc Code (Do Not Modify): Observation [10022]       B Medical/Surgery History Past Medical History:  Diagnosis Date  . Anginal pain ([place])   . Asthma   . CHF (congestive heart failure) ([place])   . Chronic kidney disease    dialysis 3x wk  . Dyspnea   . Frequent bowel movements   . GERD (gastroesophageal reflux disease)   . Hepatitis C antibody test positive   . Hypertension    "just dx'd today" (11/03/2012)   Past Surgical History:  Procedure Laterality Date  . ABDOMINAL HYSTERECTOMY     "partial" (11/03/2012)  . AV FISTULA PLACEMENT Right 12/03/2013   Procedure: CREATION OF ARTERIOVENOUS (AV) FISTULA RIGHT ARM ;  Surgeon: Rosetta Posner, MD;  Location: [place];  Service: Vascular;  Laterality: Right;  . AV FISTULA  PLACEMENT Left 05/17/2018   Procedure: CREATION OF BRACHIOCEPHALIC FISTULA LEFT ARM;  Surgeon: Waynetta Sandy, MD;  Location: [place];  Service: Vascular;  Laterality: Left;  . [place] REMOVAL Right 03/16/2018   Procedure: REMOVAL OF ARTERIOVENOUS GORETEX GRAFT ([place]) RIGHT ARM;  Surgeon: Waynetta Sandy, MD;  Location: [place];  Service: Vascular;  Laterality: Right;  . BASCILIC VEIN TRANSPOSITION Right 03/06/2014   Procedure: SECOND STAGE BASILIC VEIN TRANSPOSITION;  Surgeon: Rosetta Posner, MD;  Location: [place];  Service: Vascular;  Laterality: Right;  . BIOPSY  09/30/2018   Procedure: BIOPSY;  Surgeon: Mauri Pole, MD;  Location: [facility] ENDOSCOPY;  Service: Endoscopy;;  . ENTEROSCOPY N/A 10/06/2018   Procedure: ENTEROSCOPY;  Surgeon: Lavena Bullion, DO;  Location: [place];  Service: Gastroenterology;  Laterality: N/A;  . ESOPHAGOGASTRODUODENOSCOPY N/A 06/05/2013   Procedure: ESOPHAGOGASTRODUODENOSCOPY (EGD);  Surgeon: Winfield Cunas., MD;  Location: [facility];  Service: Endoscopy;  Laterality: N/A;  . ESOPHAGOGASTRODUODENOSCOPY N/A 09/08/2016   Procedure: ESOPHAGOGASTRODUODENOSCOPY (EGD);  Surgeon: Milus Banister, MD;  Location: [place];  Service: Endoscopy;  Laterality: N/A;  . ESOPHAGOGASTRODUODENOSCOPY (EGD) WITH PROPOFOL N/A 09/17/2018   Procedure: ESOPHAGOGASTRODUODENOSCOPY (EGD) WITH PROPOFOL;  Surgeon: Carol Ada, MD;  Location: [place];  Service: Endoscopy;  Laterality: N/A;  . ESOPHAGOGASTRODUODENOSCOPY (EGD) WITH PROPOFOL N/A 09/30/2018   Procedure: ESOPHAGOGASTRODUODENOSCOPY (EGD) WITH PROPOFOL;  Surgeon: Mauri Pole, MD;  Location: [place] ENDOSCOPY;  Service: Endoscopy;  Laterality: N/A;  .  INSERTION OF DIALYSIS CATHETER Left 03/16/2018   Procedure: PLACEMENT OF TUNNEL DIALYSIS CATHETER;  Surgeon: Waynetta Sandy, MD;  Location: [place];  Service: Vascular;  Laterality: Left;  . INSERTION OF DIALYSIS CATHETER Right 09/26/2018    Procedure: INSERTION OF DIALYSIS  TUNNELED CATHETER;  Surgeon: Elam Dutch, MD;  Location: [facility] OR;  Service: Vascular;  Laterality: Right;  . IR REMOVAL TUN CV CATH W/O FL  09/17/2018  . REVISON OF ARTERIOVENOUS FISTULA Right 01/28/2017   Procedure: REVISON OF RIGHT ARM ARTERIOVENOUS FISTULA USING 8MMX10CM GORETEX GRAFT;  Surgeon: Angelia Mould, MD;  Location: [place];  Service: Vascular;  Laterality: Right;     A IV Location/Drains/Wounds Patient Lines/Drains/Airways Status   Active Line/Drains/Airways    Name:   Placement date:   Placement time:   Site:   Days:   Fistula / Graft   -    -    -      Fistula / Graft Left Upper arm Arteriovenous fistula   05/17/18    0902    Upper arm   194   Hemodialysis Catheter Left Internal jugular Double-lumen   03/16/18    1524    Internal jugular   256   Hemodialysis Catheter Left Subclavian Triple-lumen   09/26/18    1450    Subclavian   62   Hemodialysis Catheter Right   -    -    -      Hemodialysis Catheter Right Internal jugular   -    -    Internal jugular      Incision (Closed) 03/16/18 Chest Left   03/16/18    1553     256   Incision (Closed) 03/16/18 Arm Right   03/16/18    1600     256   Incision (Closed) 05/17/18 Arm Left   05/17/18    0901     194   Incision (Closed) 09/26/18 Chest Right   09/26/18    1458     62          Intake/Output Last 24 hours No intake or output data in the 24 hours ending 11/27/18 0557  Labs/Imaging Results for orders placed or performed during the hospital encounter of 11/27/18 (from the past 48 hour(s))  SARS Coronavirus 2 (CEPHEID - Performed in [facility] hospital lab), Hosp Order     Status: None   Collection Time: 11/27/18  3:23 AM   Specimen: Nasopharyngeal Swab  Result Value Ref Range   SARS Coronavirus 2 NEGATIVE NEGATIVE    Comment: (NOTE) If result is NEGATIVE SARS-CoV-2 target nucleic acids are NOT DETECTED. The SARS-CoV-2 RNA is generally detectable in upper and lower   respiratory specimens during the acute phase of infection. The lowest  concentration of SARS-CoV-2 viral copies this assay can detect is 250  copies / mL. A negative result does not preclude SARS-CoV-2 infection  and should not be used as the sole basis for treatment or other  patient management decisions.  A negative result may occur with  improper specimen collection / handling, submission of specimen other  than nasopharyngeal swab, presence of viral mutation(s) within the  areas targeted by this assay, and inadequate number of viral copies  (<250 copies / mL). A negative result must be combined with clinical  observations, patient history, and epidemiological information. If result is POSITIVE SARS-CoV-2 target nucleic acids are DETECTED. The SARS-CoV-2 RNA is generally detectable in upper and lower  respiratory specimens  dur ing the acute phase of infection.  Positive  results are indicative of active infection with SARS-CoV-2.  Clinical  correlation with patient history and other diagnostic information is  necessary to determine patient infection status.  Positive results do  not rule out bacterial infection or co-infection with other viruses. If result is PRESUMPTIVE POSTIVE SARS-CoV-2 nucleic acids MAY BE PRESENT.   A presumptive positive result was obtained on the submitted specimen  and confirmed on repeat testing.  While 2019 novel coronavirus  (SARS-CoV-2) nucleic acids may be present in the submitted sample  additional confirmatory testing may be necessary for epidemiological  and / or clinical management purposes  to differentiate between  SARS-CoV-2 and other Sarbecovirus currently known to infect humans.  If clinically indicated additional testing with an alternate test  methodology [phone]) is advised. The SARS-CoV-2 RNA is generally  detectable in upper and lower respiratory sp ecimens during the acute  phase of infection. The expected result is Negative. Fact  Sheet for Patients:  StrictlyIdeas.no Fact Sheet for Healthcare Providers: BankingDealers.co.za This test is not yet approved or cleared by the [place] FDA and has been authorized for detection and/or diagnosis of SARS-CoV-2 by FDA under an Emergency Use Authorization (EUA).  This EUA will remain in effect (meaning this test can be used) for the duration of the COVID-19 declaration under Section 564(b)(1) of the Act, 21 U.S.C. section 360bbb-3(b)(1), unless the authorization is terminated or revoked sooner. Performed at [facility], [place] [address], [place], [place] [address]   CBC with Differential     Status: Abnormal   Collection Time: 11/27/18  3:39 AM  Result Value Ref Range   WBC 12.3 (H) 4.0 - 10.5 K/uL   RBC 4.43 3.87 - 5.11 MIL/uL   Hemoglobin 12.7 12.0 - 15.0 g/dL   HCT 40.2 36.0 - 46.0 %   MCV 90.7 80.0 - 100.0 fL   MCH 28.7 26.0 - 34.0 pg   MCHC 31.6 30.0 - 36.0 g/dL   RDW 15.2 11.5 - 15.5 %   Platelets 229 150 - 400 K/uL   nRBC 0.0 0.0 - 0.2 %   Neutrophils Relative % 52 %   Neutro Abs 6.4 1.7 - 7.7 K/uL   Lymphocytes Relative 36 %   Lymphs Abs 4.5 (H) 0.7 - 4.0 K/uL   Monocytes Relative 7 %   Monocytes Absolute 0.9 0.1 - 1.0 K/uL   Eosinophils Relative 3 %   Eosinophils Absolute 0.4 0.0 - 0.5 K/uL   Basophils Relative 1 %   Basophils Absolute 0.1 0.0 - 0.1 K/uL   Immature Granulocytes 1 %   Abs Immature Granulocytes 0.08 (H) 0.00 - 0.07 K/uL    Comment: Performed at [facility] [address], [place], [place] [address]  Basic metabolic panel     Status: Abnormal   Collection Time: 11/27/18  3:39 AM  Result Value Ref Range   Sodium 138 135 - 145 mmol/L   Potassium 5.6 (H) 3.5 - 5.1 mmol/L   Chloride 100 98 - 111 mmol/L   CO2 20 (L) 22 - 32 mmol/L   Glucose, Bld 125 (H) 70 - 99 mg/dL   BUN 39 (H) 6 - 20 mg/dL   Creatinine, Ser 9.33 (H) 0.44 - 1.00 mg/dL   Calcium 8.9 8.9 - 10.3 mg/dL   GFR  calc non Af Amer 4 (L) >60 mL/min   GFR calc Af Amer 5 (L) >60 mL/min   Anion gap 18 (  H) 5 - 15    Comment: Performed at [facility], [place] [address], [place], [place] [address]  POCT I-Stat EG7     Status: Abnormal   Collection Time: 11/27/18  4:08 AM  Result Value Ref Range   pH, Ven 7.246 (L) 7.250 - 7.430   pCO2, Ven 49.0 44.0 - 60.0 mmHg   pO2, Ven 48.0 (H) 32.0 - 45.0 mmHg   Bicarbonate 21.3 20.0 - 28.0 mmol/L   TCO2 23 22 - 32 mmol/L   O2 Saturation 77.0 %   Acid-base deficit 6.0 (H) 0.0 - 2.0 mmol/L   Sodium 137 135 - 145 mmol/L   Potassium 5.1 3.5 - 5.1 mmol/L   Calcium, Ion 1.04 (L) 1.15 - 1.40 mmol/L   HCT 42.0 36.0 - 46.0 %   Hemoglobin 14.3 12.0 - 15.0 g/dL   Patient temperature 37.0 C    Sample type VENOUS    Dg Chest Port 1 View  Result Date: 11/27/2018 CLINICAL DATA:  Shortness of Breath EXAM: PORTABLE CHEST 1 VIEW COMPARISON:  10/05/2018 FINDINGS: Mild cardiomegaly. Mild vascular congestion. Perihilar and lower lobe opacities noted on the left. Small right pleural effusion. No acute bony abnormality. Right dialysis catheter is unchanged. IMPRESSION: Mild cardiomegaly, vascular congestion. Left perihilar and lower lobe airspace opacity could reflect asymmetric edema or infection. Electronically Signed   By: Rolm Baptise M.D.   On: 11/27/2018 03:58    Pending Labs Unresulted Labs (From admission, onward)    Start     Ordered   11/28/18 3846  Basic metabolic panel  Tomorrow morning,   R     11/27/18 0515   11/28/18 0500  CBC  Tomorrow morning,   R     11/27/18 0515          Vitals/Pain Today's Vitals   11/27/18 0326 11/27/18 0330 11/27/18 0400 11/27/18 0549  BP:  (!) 185/166 (!) 204/136 (!) 181/114  Pulse: (!) 25   93  Resp: (!) 32 (!) 28 (!) 35 (!) 26  Temp: (!) 97.5 F (36.4 C)     TempSrc: Oral     SpO2: 99%   100%  Weight:      Height:        Isolation Precautions No active isolations  Medications Medications  nitroGLYCERIN (NITROSTAT)  SL tablet 0.4 mg (0.4 mg Sublingual Given 11/27/18 0350)  nitroGLYCERIN 50 mg in dextrose 5 % 250 mL (0.2 mg/mL) infusion (has no administration in time range)  nicotine (NICODERM CQ - dosed in mg/24 hours) patch 21 mg (has no administration in time range)  heparin injection 5,000 Units (has no administration in time range)    Mobility walks with device Moderate fall risk   Focused Assessments Pulmonary Assessment Handoff:  Lung sounds: Bilateral Breath Sounds: Expiratory wheezes, Inspiratory wheezes L Breath Sounds: Expiratory wheezes, Inspiratory wheezes R Breath Sounds: Expiratory wheezes, Inspiratory wheezes O2 Device: Bi-PAP O2 Flow Rate (L/min): 15 L/min      R Recommendations: See Admitting Provider Note  Report given to:   Additional Notes:

## 2018-11-27 NOTE — Consult Note (Addendum)
[facility] Renal Consultation Note    Indication for Consultation:  Management of ESRD/hemodialysis; anemia, hypertension/volume and secondary hyperparathyroidism PCP: Dr. Karle Plumber  HPI: Karen Morrow is a 58 Y/O female with ESRD on hemodialysis MWF at [facility]. PMH significant for HTN, Hepatitis C with cirrhosis, thrombocytopenia, G1DD, SHPT, AOCD, medical noncompliance. Last HD 11/23/18 left 10 minutes early 0.3 above OP EDW.   Patient missed HD 11/25/18. Presented to ED 11/27/2018 with C/O SOB. CXR showed mild vascular congestion, L perihilar and LLL opacities, small R. Pleural effusion. WBC 12.3 K+ 5.6 HGB 12.7. COVID 19 negative. Afebrile on admission, very hypertensive BP 174/113 HR initially high came down to 90s, /She was placed on BIPAP and admitted as observation patient for volume overload/hypertensive urgency. She has since been upgraded to inpatient.   Patient is awake, alert, oriented X 3. Says she missed HD Saturday to go to family event, ate 4 hot dogs. This AM she said she awoke, couldn't breath and "thought I was going to die". Currently on NTG gtt, O2, feels better. Consequently has had chronic diarrhea and diarrhea this AM. She asked for immodium here. Now on enteric contact isolation, C Diff tests ordered. Denies fever, chills, N, V, has chronic diarrhea, + SOB, edema. Patient is almost blind, says her vision is progressively worsening.   Past Medical History:  Diagnosis Date  . Anginal pain ([place])   . Asthma   . CHF (congestive heart failure) ([place])   . Chronic kidney disease    dialysis 3x wk  . Dyspnea   . Frequent bowel movements   . GERD (gastroesophageal reflux disease)   . Hepatitis C antibody test positive   . Hypertension    "just dx'd today" (11/03/2012)   Past Surgical History:  Procedure Laterality Date  . ABDOMINAL HYSTERECTOMY     "partial" (11/03/2012)  . AV FISTULA PLACEMENT Right 12/03/2013   Procedure: CREATION  OF ARTERIOVENOUS (AV) FISTULA RIGHT ARM ;  Surgeon: Rosetta Posner, MD;  Location: [place];  Service: Vascular;  Laterality: Right;  . AV FISTULA PLACEMENT Left 05/17/2018   Procedure: CREATION OF BRACHIOCEPHALIC FISTULA LEFT ARM;  Surgeon: Waynetta Sandy, MD;  Location: [place];  Service: Vascular;  Laterality: Left;  . [place] REMOVAL Right 03/16/2018   Procedure: REMOVAL OF ARTERIOVENOUS GORETEX GRAFT ([place]) RIGHT ARM;  Surgeon: Waynetta Sandy, MD;  Location: [place];  Service: Vascular;  Laterality: Right;  . BASCILIC VEIN TRANSPOSITION Right 03/06/2014   Procedure: SECOND STAGE BASILIC VEIN TRANSPOSITION;  Surgeon: Rosetta Posner, MD;  Location: [place];  Service: Vascular;  Laterality: Right;  . BIOPSY  09/30/2018   Procedure: BIOPSY;  Surgeon: Mauri Pole, MD;  Location: [facility] ENDOSCOPY;  Service: Endoscopy;;  . ENTEROSCOPY N/A 10/06/2018   Procedure: ENTEROSCOPY;  Surgeon: Lavena Bullion, DO;  Location: [place];  Service: Gastroenterology;  Laterality: N/A;  . ESOPHAGOGASTRODUODENOSCOPY N/A 06/05/2013   Procedure: ESOPHAGOGASTRODUODENOSCOPY (EGD);  Surgeon: Winfield Cunas., MD;  Location: [facility];  Service: Endoscopy;  Laterality: N/A;  . ESOPHAGOGASTRODUODENOSCOPY N/A 09/08/2016   Procedure: ESOPHAGOGASTRODUODENOSCOPY (EGD);  Surgeon: Milus Banister, MD;  Location: [place];  Service: Endoscopy;  Laterality: N/A;  . ESOPHAGOGASTRODUODENOSCOPY (EGD) WITH PROPOFOL N/A 09/17/2018   Procedure: ESOPHAGOGASTRODUODENOSCOPY (EGD) WITH PROPOFOL;  Surgeon: Carol Ada, MD;  Location: [place];  Service: Endoscopy;  Laterality: N/A;  . ESOPHAGOGASTRODUODENOSCOPY (EGD) WITH PROPOFOL N/A 09/30/2018   Procedure: ESOPHAGOGASTRODUODENOSCOPY (EGD) WITH PROPOFOL;  Surgeon: Harl Bowie  V, MD;  Location: [place] ENDOSCOPY;  Service: Endoscopy;  Laterality: N/A;  . INSERTION OF DIALYSIS CATHETER Left 03/16/2018   Procedure: PLACEMENT OF TUNNEL DIALYSIS CATHETER;  Surgeon: Waynetta Sandy, MD;  Location: [place];  Service: Vascular;  Laterality: Left;  . INSERTION OF DIALYSIS CATHETER Right 09/26/2018   Procedure: INSERTION OF DIALYSIS  TUNNELED CATHETER;  Surgeon: Elam Dutch, MD;  [facility];  Service: Vascular;  Laterality: Right;  . IR REMOVAL TUN CV CATH W/O FL  09/17/2018  . REVISON OF ARTERIOVENOUS FISTULA Right 01/28/2017   Procedure: REVISON OF RIGHT ARM ARTERIOVENOUS FISTULA USING 8MMX10CM GORETEX GRAFT;  Surgeon: Angelia Mould, MD;  Location: [facility] OR;  Service: Vascular;  Laterality: Right;   Family History  Problem Relation Age of Onset  . Lupus Mother   . Diabetes Father   . Heart attack Father    Social History:  reports that she has been smoking cigarettes. She has a 10.00 pack-year smoking history. She has never used smokeless tobacco. She reports previous alcohol use of about 1.0 standard drinks of alcohol per week. She reports previous drug use. Drug: Marijuana. Allergies  Allergen Reactions  . Cefazolin Other (See Comments)    Severe thrombocytopenia (has tolerated zosyn in the past) ID aware Tolerated in October 2019   Prior to Admission medications   Medication Sig Start Date End Date Taking? Authorizing Provider  acetaminophen (TYLENOL) 500 MG tablet Take 500-1,000 mg by mouth every 6 (six) hours as needed for headache (pain).    [provider]  albuterol (PROVENTIL HFA;VENTOLIN HFA) 108 (90 Base) MCG/ACT inhaler TAKE 2 PUFFS BY MOUTH EVERY 6 HOURS AS NEEDED FOR WHEEZE OR SHORTNESS OF BREATH Patient not taking: Reported on 10/18/2018 05/09/18   [name], MD  albuterol (PROVENTIL) (2.5 MG/3ML) 0.083% nebulizer solution Take 3 mLs (2.5 mg total) by nebulization every 6 (six) hours as needed for wheezing or shortness of breath. MUST MAKE APPT FOR FURTHER REFILLS 08/15/18   [name], MD  Amino Acids-Protein Hydrolys (FEEDING SUPPLEMENT, PRO-STAT SUGAR FREE 64,) LIQD Take 30 mLs by mouth 2 (two)  times daily. 09/19/18   Hosie Poisson, MD  amLODipine (NORVASC) 10 MG tablet Take 1 tablet (10 mg total) by mouth daily. Patient taking differently: Take 10 mg by mouth at bedtime.  05/16/18   Thurnell Lose, MD  b complex-vitamin c-folic acid (NEPHRO-VITE) 0.8 MG TABS tablet Take 1 tablet by mouth every evening. 07/27/18   [provider]  calcium acetate (PHOSLO) 667 MG capsule Take 1,334 mg by mouth 3 (three) times daily with meals.    [provider]  cinacalcet (SENSIPAR) 30 MG tablet TAKE 1 TABLET BY MOUTH DAILY EVERY EVENING (DO NOT TAKE LESS THAN 12 HOURS PRIOR TO DIALYSIS) Patient taking differently: Take 30 mg by mouth daily with supper. (DO NOT TAKE LESS THAN 12 HOURS PRIOR TO DIALYSIS) 09/19/18   Hosie Poisson, MD  ferric citrate (AURYXIA) 1 GM 210 MG(Fe) tablet Take 420 mg by mouth 3 (three) times daily with meals.    [provider]  metoprolol tartrate (LOPRESSOR) 25 MG tablet Take 25 mg by mouth 2 (two) times daily.    [provider]  Nutritional Supplements (FEEDING SUPPLEMENT, NEPRO CARB STEADY,) LIQD Take 237 mLs by mouth 2 (two) times daily between meals. Patient not taking: Reported on 10/09/2018 10/01/18   Mercy Riding, MD  oxyCODONE-acetaminophen (PERCOCET/ROXICET) 5-325 MG tablet Take 1 tablet by mouth every 6 (  six) hours as needed for severe pain. Patient not taking: Reported on 10/09/2018 09/26/18   Elam Dutch, MD  pantoprazole (PROTONIX) 40 MG tablet Take 1 tablet (40 mg total) by mouth 2 (two) times daily. 10/01/18   Mercy Riding, MD  sucralfate (CARAFATE) 1 g tablet Take 1 g by mouth 4 (four) times daily -  with meals and at bedtime.    [provider]   Current Facility-Administered Medications  Medication Dose Route Frequency Provider Last Rate Last Dose  . 0.9 %  sodium chloride infusion  100 mL Intravenous PRN Valentina Gu, NP      . 0.9 %  sodium chloride infusion  100 mL Intravenous PRN Valentina Gu,  NP      . acetaminophen (TYLENOL) tablet 650 mg  650 mg Oral Q6H PRN Ivor Costa, MD      . albuterol (VENTOLIN HFA) 108 (90 Base) MCG/ACT inhaler 2 puff  2 puff Inhalation Q4H PRN Ivor Costa, MD      . alteplase (CATHFLO ACTIVASE) injection 2 mg  2 mg Intracatheter Once PRN Valentina Gu, NP      . amLODipine (NORVASC) tablet 10 mg  10 mg Oral QHS Ivor Costa, MD      . calcium acetate (PHOSLO) capsule 1,334 mg  1,334 mg Oral TID WC Ivor Costa, MD      . Chlorhexidine Gluconate Cloth 2 % PADS 6 each  6 each Topical Q0600 Valentina Gu, NP      . cinacalcet (SENSIPAR) tablet 30 mg  30 mg Oral Q supper Ivor Costa, MD      . feeding supplement (PRO-STAT SUGAR FREE 64) liquid 30 mL  30 mL Oral BID Ivor Costa, MD      . ferric citrate (AURYXIA) tablet 420 mg  420 mg Oral TID WC Ivor Costa, MD      . heparin injection 1,000 Units  1,000 Units Dialysis PRN Valentina Gu, NP      . heparin injection 5,000 Units  5,000 Units Subcutaneous Q8H Ivor Costa, MD      . lidocaine (PF) (XYLOCAINE) 1 % injection 5 mL  5 mL Intradermal PRN Valentina Gu, NP      . lidocaine-prilocaine (EMLA) cream 1 application  1 application Topical PRN Valentina Gu, NP      . metoprolol tartrate (LOPRESSOR) tablet 25 mg  25 mg Oral BID Ivor Costa, MD      . multivitamin (RENA-VIT) tablet 1 tablet  1 tablet Oral QHS Ivor Costa, MD      . nicotine (NICODERM CQ - dosed in mg/24 hours) patch 21 mg  21 mg Transdermal Daily Ivor Costa, MD   21 mg at 11/27/18 1116  . nitroGLYCERIN (NITROSTAT) SL tablet 0.4 mg  0.4 mg Sublingual Q5 Min x 3 PRN Larene Pickett, PA-C   0.4 mg at 11/27/18 0350  . nitroGLYCERIN 50 mg in dextrose 5 % 250 mL (0.2 mg/mL) infusion  0-200 mcg/min Intravenous Titrated Ivor Costa, MD 25.5 mL/hr at 11/27/18 0702 85 mcg/min at 11/27/18 0702  . ondansetron (ZOFRAN) injection 4 mg  4 mg Intravenous Q8H PRN Ivor Costa, MD      . pantoprazole (PROTONIX) EC tablet 40 mg  40 mg Oral BID  Ivor Costa, MD      . pentafluoroprop-tetrafluoroeth Landry Dyke) aerosol 1 application  1 application Topical PRN Valentina Gu, NP      . sucralfate (CARAFATE) tablet 1 g  1 g Oral TID WC & HS Ivor Costa, MD       Labs: Basic Metabolic Panel: Recent Labs  Lab 11/27/18 0339 11/27/18 0408  NA 138  138 137  K 5.8*  5.6* 5.1  CL 101  100  --   CO2 20*  20*  --   GLUCOSE 118*  125*  --   BUN 40*  39*  --   CREATININE 9.22*  9.33*  --   CALCIUM 8.9  8.9  --   PHOS 8.2*  --    Liver Function Tests: Recent Labs  Lab 11/27/18 0339  ALBUMIN 2.9*   No results for input(s): LIPASE, AMYLASE in the last 168 hours. No results for input(s): AMMONIA in the last 168 hours. CBC: Recent Labs  Lab 11/27/18 0339 11/27/18 0408  WBC 12.3*  --   NEUTROABS 6.4  --   HGB 12.7 14.3  HCT 40.2 42.0  MCV 90.7  --   PLT 229  --    Cardiac Enzymes: No results for input(s): CKTOTAL, CKMB, CKMBINDEX, TROPONINI in the last 168 hours. CBG: No results for input(s): GLUCAP in the last 168 hours. Iron Studies: No results for input(s): IRON, TIBC, TRANSFERRIN, FERRITIN in the last 72 hours. Studies/Results: Dg Chest Port 1 View  Result Date: 11/27/2018 CLINICAL DATA:  Shortness of Breath EXAM: PORTABLE CHEST 1 VIEW COMPARISON:  10/05/2018 FINDINGS: Mild cardiomegaly. Mild vascular congestion. Perihilar and lower lobe opacities noted on the left. Small right pleural effusion. No acute bony abnormality. Right dialysis catheter is unchanged. IMPRESSION: Mild cardiomegaly, vascular congestion. Left perihilar and lower lobe airspace opacity could reflect asymmetric edema or infection. Electronically Signed   By: Rolm Baptise M.D.   On: 11/27/2018 03:58    ROS: As per HPI otherwise negative.   Physical Exam: Vitals:   11/27/18 0716 11/27/18 0745 11/27/18 0936 11/27/18 0937  BP:  (!) 174/102 (!) 157/107 (!) 148/99  Pulse:  93 87   Resp:  (!) 28 18   Temp:   97.9 F (36.6 C)   TempSrc:    Oral   SpO2: 100% 100% 100%   Weight:      Height:   5\' 3"  (1.6 m)      General: Chronically ill appearing female looks older than stated age. NAD Head: Normocephalic, atraumatic, sclera non-icteric, mucus membranes are moist. Face is puffy with periorbital edema.  Neck: Supple. JVD elevated 1/2 to mandible Lungs: Bilateral breath sounds with bibasilar crackles.  Heart: RRR with S1 S2. No murmurs, rubs, or gallops appreciated. Abdomen: Soft, non-tender, non-distended with normoactive bowel sounds. No rebound/guarding. No obvious abdominal masses. M-S:  Strength and tone appear normal for age. Lower extremities: Trace-1+ Bilateral LE edema Neuro: Alert and oriented X 3. Moves all extremities spontaneously. Psych:  Responds to questions appropriately with a normal affect. Dialysis Access: L AVF + bruit RIJ TDC needs to be removed.   HD orders: [place] T,Th,S 3 hr 45 min 180NRe 350/600 50.5 kg 2.0K/ 2.5 Ca L AVF  -Hectorol 1 mcg IV TIW -Mircera 200 mcg IV q 2 weeks (last dose 11/14/18) -Venofer 50 mg IV  weekly  Assessment/Plan: 1.  Acute Hypoxic Respiratory Failure/Volume Overload in setting of missed HD. HD today off schedule. Max volume removal. Serial HD for volume removal.  2.  Hypertensive Urgency: Home meds resumed. BP will improve with volume removal.  3.  ESRD -  T,Th,S HD today and again tomorrow for volume removal. K+ 5.6 on admission.  2.0 K bath. No heparin.  4.  Hypertension/volume  - As noted above. Pre wt 54.4 kg. 4 liters above OP EDW.   5.  Anemia  - HGB 12.7 No ESA needed.  6.  Metabolic bone disease -  Continue binders, VDRA 7.  Nutrition - Albumin 2.9 Renal diet. prostat, renal vits.  8.  H/O Hepatitis C 10.  Noncompliance with HD. 11.  GERD: On carafate which is inappropriate for HD patient D/T risks of aluminum toxicity! Continue protonix.    H. Owens Shark, NP-C 11/27/2018, 2:01 PM  [employer] [phone]

## 2018-11-27 NOTE — ED Notes (Signed)
Pt initially hypoxic on assessment. Put on 6 L IXL.

## 2018-11-27 NOTE — Discharge Summary (Signed)
Physician Discharge Summary  Karen Morrow ZOX:[id number] DOB: Dec 02, 1960 DOA: 11/27/2018  PCP: [name], MD  Admit date: 11/27/2018 Discharge date: 11/27/2018  Recommendations for Outpatient Follow-up:  1. Follow up with PCP n 7-10 days. 2. Go for dialysis tomorrow. Then resume your usual schedule 3. Keep appointments with nephrology.   Discharge Diagnoses: Principal diagnosis is #1 1. Pulmonary edema 2. Volume overload 3. Hypertensiion 4. ESRD on HD. 5. Hyperkalemia  Discharge Condition: Fair Disposition: Home  Diet recommendation: Renal  Filed Weights   11/27/18 0314  Weight: 54.4 kg    History of present illness:  Karen Morrow is a 58 y.o. female with medical history significant of ESRD-HD (TTS), hypertension, asthma, GERD, dCHF, gastric ulcer, GI bleeding, tobacco abuse, HCV, who presents with shortness of breath.   Patient states that she missed the dialysis on Saturday, and developed shortness of breath since yesterday, which has been progressively worsening.  She denies cough, fever or chills.  She had some chest tightness earlier, which has resolved.  Currently no chest pain.  Denies nausea, vomiting, diarrhea, abdominal pain, symptoms of UTI or unilateral weakness.She has not had any sick contacts. She has had COVID testing x 2 that has been negative recently.  ED Course: pt was found to have WBC 12.3, negative COVID-19 test, potassium 5.6, bicarbonate 20, creatinine 9.33, BUN 39, temperature 97.5, heart rate 25?, 143, oxygen desaturation to 88% on room air, VBG with pH 7.246, CO2 49 and O2 48, BiPAP started, chest x-ray showed vascular congestion and cardiomegaly, questionable left lower lobe obesity.  Patient is placed on stepdown bed for observation.  ED physician will consult renal for urgent dialysis.  Hospital Course:  The patient was admitted to a progressive care unit on a nitroglycerine drip to control blood pressure to volume overload  situation. Nephrology was consulted. The patient underwent dialysis on 11/27/2018. She has had resolution of volume overload. I have discussed the patient with Dr. Justin Mend who has cleared her for discharge and wishes for the patient to go for dialysis again tomorrow. He stated that he planned to reduce her dry weight.  Today's assessment: S: The patient is awake, alert, and oriented x 3. She is frustrated with having to wait for dialysis today. O: Vitals:  Vitals:   11/27/18 1630 11/27/18 1700  BP: (!) (P) 155/99 (!) (P) 158/87  Pulse: (P) 98 (P) 88  Resp: (!) (P) 24 (!) (P) 24  Temp:    SpO2:      Constitutional:  The patient is awake, alert, and oriented x 3. She is agitated and anxious to discharge. Respiratory:   CTA bilaterally, no w/r/r.   Respiratory effort normal. No retractions or accessory muscle use Cardiovascular:   RRR, no m/r/g  No LE extremity edema    Normal pedal pulses Abdomen:   Abdomen appears normal; no tenderness or masses  No hernias  No HSM Musculoskeletal:   Digits/nails: no clubbing, cyanosis, petechiae, infection  exam of joints, bones, muscles of at least one of following: head/neck, RUE, LUE, RLE, LLE   o strength and tone normal, no atrophy, no abnormal movements o No tenderness, masses o Normal ROM, no contractures   gait and station Skin:   No rashes, lesions, ulcers  palpation of skin: no induration or nodules Neurologic:   CN 2-12 intact  Sensation all 4 extremities intact Psychiatric:   judgement and insight appear normal  Mental status o Mood, affect appropriate o Orientation to person, place, time  Discharge Instructions  Discharge Instructions    Activity as tolerated - No restrictions   Complete by: As directed    Call MD for:  persistant nausea and vomiting   Complete by: As directed    Call MD for:  severe uncontrolled pain   Complete by: As directed    Diet - low sodium heart healthy   Complete by: As  directed    Discharge instructions   Complete by: As directed    Patient is to go for dialysis tomorrow with dry weight adjusted down as per Dr. Jason Nest instructions. She is to follow up with PCP in 7-10 days.  She is to keep appointment with nephrology.   Increase activity slowly   Complete by: As directed      Allergies as of 11/27/2018      Reactions   Cefazolin Other (See Comments)   Severe thrombocytopenia (has tolerated zosyn in the past) ID aware Tolerated in October 2019      Medication List    STOP taking these medications   feeding supplement (NEPRO CARB STEADY) Liqd   sucralfate 1 g tablet Commonly known as: CARAFATE     TAKE these medications   acetaminophen 500 MG tablet Commonly known as: TYLENOL Take 500-1,000 mg by mouth every 6 (six) hours as needed for headache (pain).   albuterol 108 (90 Base) MCG/ACT inhaler Commonly known as: VENTOLIN HFA TAKE 2 PUFFS BY MOUTH EVERY 6 HOURS AS NEEDED FOR WHEEZE OR SHORTNESS OF BREATH What changed: Another medication with the same name was removed. Continue taking this medication, and follow the directions you see here.   amLODipine 10 MG tablet Commonly known as: NORVASC Take 1 tablet (10 mg total) by mouth daily. What changed: when to take this   Auryxia 1 GM 210 MG(Fe) tablet Generic drug: ferric citrate Take 420 mg by mouth 3 (three) times daily with meals.   b complex-vitamin c-folic acid 0.8 MG Tabs tablet Take 1 tablet by mouth every evening.   calcium acetate 667 MG capsule Commonly known as: PHOSLO Take 1,334 mg by mouth 3 (three) times daily with meals.   cinacalcet 30 MG tablet Commonly known as: SENSIPAR TAKE 1 TABLET BY MOUTH DAILY EVERY EVENING (DO NOT TAKE LESS THAN 12 HOURS PRIOR TO DIALYSIS) What changed:   how much to take  how to take this  when to take this  additional instructions   feeding supplement (PRO-STAT SUGAR FREE 64) Liqd Take 30 mLs by mouth 2 (two) times daily.     metoprolol tartrate 25 MG tablet Commonly known as: LOPRESSOR Take 25 mg by mouth 2 (two) times daily.   oxyCODONE-acetaminophen 5-325 MG tablet Commonly known as: PERCOCET/ROXICET Take 1 tablet by mouth every 6 (six) hours as needed for severe pain.   pantoprazole 40 MG tablet Commonly known as: PROTONIX Take 1 tablet (40 mg total) by mouth 2 (two) times daily.      Allergies  Allergen Reactions   Cefazolin Other (See Comments)    Severe thrombocytopenia (has tolerated zosyn in the past) ID aware Tolerated in October 2019    The results of significant diagnostics from this hospitalization (including imaging, microbiology, ancillary and laboratory) are listed below for reference.    Significant Diagnostic Studies: Dg Chest Port 1 View  Result Date: 11/27/2018 CLINICAL DATA:  Shortness of Breath EXAM: PORTABLE CHEST 1 VIEW COMPARISON:  10/05/2018 FINDINGS: Mild cardiomegaly. Mild vascular congestion. Perihilar and lower lobe opacities noted on the left. Small  right pleural effusion. No acute bony abnormality. Right dialysis catheter is unchanged. IMPRESSION: Mild cardiomegaly, vascular congestion. Left perihilar and lower lobe airspace opacity could reflect asymmetric edema or infection. Electronically Signed   By: Rolm Baptise M.D.   On: 11/27/2018 03:58    Microbiology: Recent Results (from the past 240 hour(s))  SARS Coronavirus 2 (CEPHEID - Performed in [place] hospital lab), Hosp Order     Status: None   Collection Time: 11/27/18  3:23 AM   Specimen: Nasopharyngeal Swab  Result Value Ref Range Status   SARS Coronavirus 2 NEGATIVE NEGATIVE Final    Comment: (NOTE) If result is NEGATIVE SARS-CoV-2 target nucleic acids are NOT DETECTED. The SARS-CoV-2 RNA is generally detectable in upper and lower  respiratory specimens during the acute phase of infection. The lowest  concentration of SARS-CoV-2 viral copies this assay can detect is 250  copies / mL. A negative  result does not preclude SARS-CoV-2 infection  and should not be used as the sole basis for treatment or other  patient management decisions.  A negative result may occur with  improper specimen collection / handling, submission of specimen other  than nasopharyngeal swab, presence of viral mutation(s) within the  areas targeted by this assay, and inadequate number of viral copies  (<250 copies / mL). A negative result must be combined with clinical  observations, patient history, and epidemiological information. If result is POSITIVE SARS-CoV-2 target nucleic acids are DETECTED. The SARS-CoV-2 RNA is generally detectable in upper and lower  respiratory specimens dur ing the acute phase of infection.  Positive  results are indicative of active infection with SARS-CoV-2.  Clinical  correlation with patient history and other diagnostic information is  necessary to determine patient infection status.  Positive results do  not rule out bacterial infection or co-infection with other viruses. If result is PRESUMPTIVE POSTIVE SARS-CoV-2 nucleic acids MAY BE PRESENT.   A presumptive positive result was obtained on the submitted specimen  and confirmed on repeat testing.  While 2019 novel coronavirus  (SARS-CoV-2) nucleic acids may be present in the submitted sample  additional confirmatory testing may be necessary for epidemiological  and / or clinical management purposes  to differentiate between  SARS-CoV-2 and other Sarbecovirus currently known to infect humans.  If clinically indicated additional testing with an alternate test  methodology [phone]) is advised. The SARS-CoV-2 RNA is generally  detectable in upper and lower respiratory sp ecimens during the acute  phase of infection. The expected result is Negative. Fact Sheet for Patients:  StrictlyIdeas.no Fact Sheet for Healthcare Providers: BankingDealers.co.za This test is not yet  approved or cleared by the [place] FDA and has been authorized for detection and/or diagnosis of SARS-CoV-2 by FDA under an Emergency Use Authorization (EUA).  This EUA will remain in effect (meaning this test can be used) for the duration of the COVID-19 declaration under Section 564(b)(1) of the Act, 21 U.S.C. section 360bbb-3(b)(1), unless the authorization is terminated or revoked sooner. Performed at [facility], [place] [address], [place], [place] [address]      Labs: Basic Metabolic Panel: Recent Labs  Lab 11/27/18 0339 11/27/18 0408  NA 138   138 137  K 5.8*   5.6* 5.1  CL 101   100  --   CO2 20*   20*  --   GLUCOSE 118*   125*  --   BUN 40*   39*  --   CREATININE 9.22*   9.33*  --  CALCIUM 8.9   8.9  --   PHOS 8.2*  --    Liver Function Tests: Recent Labs  Lab 11/27/18 0339  ALBUMIN 2.9*   No results for input(s): LIPASE, AMYLASE in the last 168 hours. No results for input(s): AMMONIA in the last 168 hours. CBC: Recent Labs  Lab 11/27/18 0339 11/27/18 0408  WBC 12.3*  --   NEUTROABS 6.4  --   HGB 12.7 14.3  HCT 40.2 42.0  MCV 90.7  --   PLT 229  --    Cardiac Enzymes: No results for input(s): CKTOTAL, CKMB, CKMBINDEX, TROPONINI in the last 168 hours. BNP: BNP (last 3 results) Recent Labs    05/13/18 0539 09/15/18 2126  BNP >4,500.0* 3,309.9*    ProBNP (last 3 results) No results for input(s): PROBNP in the last 8760 hours.  CBG: No results for input(s): GLUCAP in the last 168 hours.  Principal Problem:   Fluid overload Active Problems:   Gastric ulcer   HTN (hypertension)   ESRD on dialysis (HCC)   Hyperkalemia   Hypertensive urgency   Tobacco abuse   Acute on chronic diastolic CHF (congestive heart failure) (HCC)   Acute respiratory failure with hypoxia (HCC)   Leukocytosis   Time coordinating discharge: 38 minutes.  Signed:        Elon Lomeli, DO [facility]  11/27/2018, 5:16 PM

## 2018-11-27 NOTE — ED Notes (Signed)
EDP gave ok to use R side for IV, d/t this fistula not being used any longer

## 2018-11-27 NOTE — Progress Notes (Signed)
Question about discharging after HD. NP spoke to Dr. Justin Mend. Per Dr. Justin Mend, [facility] [place] for pt to go and pt has outpt appt already for tomorrow. RN aware.  KJKG, NP [facility]

## 2018-11-27 NOTE — ED Triage Notes (Signed)
Pt presents from home by [facility]. Pt c/o SOB upon waking. Missed dialysis sat, did not take meds today. Hypertensive and hypoxic w/ EMS.

## 2018-11-27 NOTE — ED Notes (Signed)
Report attempt x 1 AD deciding whether to accept patient to their unit.

## 2018-11-27 NOTE — H&P (Signed)
History and Physical    Karen Morrow HQI:[id number] DOB: 03-Dec-1960 DOA: 11/27/2018  Referring MD/NP/PA:   PCP: [name], MD   Patient coming from:  The patient is coming from home.  At baseline, pt is independent for most of ADL.        Chief Complaint: SOB  HPI: Karen Morrow is a 58 y.o. female with medical history significant of ESRD-HD (TTS), hypertension, asthma, GERD, dCHF, gastric ulcer, GI bleeding, tobacco abuse, HCV, who presents with shortness of breath.   Patient states that she missed the dialysis on Saturday, and developed shortness of breath since yesterday, which has been progressively worsening.  She denies cough, fever or chills.  She had some chest tightness earlier, which has resolved.  Currently no chest pain.  Denies nausea, vomiting, diarrhea, abdominal pain, symptoms of UTI or unilateral weakness. She has not had any sick contacts.  She has had COVID testing x 2 that has been negative recently.  ED Course: pt was found to have WBC 12.3, negative COVID-19 test, potassium 5.6, bicarbonate 20, creatinine 9.33, BUN 39, temperature 97.5, heart rate 25?, 143, oxygen desaturation to 88% on room air, VBG with pH 7.246, CO2 49 and O2 48, BiPAP started, chest x-ray showed vascular congestion and cardiomegaly, questionable left lower lobe obesity.  Patient is placed on stepdown bed for observation.  ED physician will consult renal for urgent dialysis.  Review of Systems:   General: no fevers, chills, no body weight gain, has fatigue HEENT: no blurry vision, hearing changes or sore throat Respiratory: has dyspnea, no coughing, wheezing CV: no chest pain, no palpitations GI: no nausea, vomiting, abdominal pain, diarrhea, constipation GU: no dysuria, burning on urination, increased urinary frequency, hematuria  Ext: has trace leg edema Neuro: no unilateral weakness, numbness, or tingling, no vision change or hearing loss Skin: no rash, no skin tear. MSK: No  muscle spasm, no deformity, no limitation of range of movement in spin Heme: No easy bruising.  Travel history: No recent long distant travel.  Allergy:  Allergies  Allergen Reactions  . Cefazolin Other (See Comments)    Severe thrombocytopenia (has tolerated zosyn in the past) ID aware Tolerated in October 2019    Past Medical History:  Diagnosis Date  . Anginal pain ([place])   . Asthma   . CHF (congestive heart failure) ([place])   . Chronic kidney disease    dialysis 3x wk  . Dyspnea   . Frequent bowel movements   . GERD (gastroesophageal reflux disease)   . Hepatitis C antibody test positive   . Hypertension    "just dx'd today" (11/03/2012)    Past Surgical History:  Procedure Laterality Date  . ABDOMINAL HYSTERECTOMY     "partial" (11/03/2012)  . AV FISTULA PLACEMENT Right 12/03/2013   Procedure: CREATION OF ARTERIOVENOUS (AV) FISTULA RIGHT ARM ;  Surgeon: Rosetta Posner, MD;  Location: [place];  Service: Vascular;  Laterality: Right;  . AV FISTULA PLACEMENT Left 05/17/2018   Procedure: CREATION OF BRACHIOCEPHALIC FISTULA LEFT ARM;  Surgeon: Waynetta Sandy, MD;  Location: [place];  Service: Vascular;  Laterality: Left;  . [place] REMOVAL Right 03/16/2018   Procedure: REMOVAL OF ARTERIOVENOUS GORETEX GRAFT ([place]) RIGHT ARM;  Surgeon: Waynetta Sandy, MD;  Location: [place];  Service: Vascular;  Laterality: Right;  . BASCILIC VEIN TRANSPOSITION Right 03/06/2014   Procedure: SECOND STAGE BASILIC VEIN TRANSPOSITION;  Surgeon: Rosetta Posner, MD;  Location: [place];  Service: Vascular;  Laterality: Right;  . BIOPSY  09/30/2018   Procedure: BIOPSY;  Surgeon: Mauri Pole, MD;  Location: [facility] ENDOSCOPY;  Service: Endoscopy;;  . ENTEROSCOPY N/A 10/06/2018   Procedure: ENTEROSCOPY;  Surgeon: Lavena Bullion, DO;  Location: [place];  Service: Gastroenterology;  Laterality: N/A;  . ESOPHAGOGASTRODUODENOSCOPY N/A 06/05/2013   Procedure: ESOPHAGOGASTRODUODENOSCOPY (EGD);  Surgeon:  Winfield Cunas., MD;  Location: [facility];  Service: Endoscopy;  Laterality: N/A;  . ESOPHAGOGASTRODUODENOSCOPY N/A 09/08/2016   Procedure: ESOPHAGOGASTRODUODENOSCOPY (EGD);  Surgeon: Milus Banister, MD;  Location: [place];  Service: Endoscopy;  Laterality: N/A;  . ESOPHAGOGASTRODUODENOSCOPY (EGD) WITH PROPOFOL N/A 09/17/2018   Procedure: ESOPHAGOGASTRODUODENOSCOPY (EGD) WITH PROPOFOL;  Surgeon: Carol Ada, MD;  Location: [place];  Service: Endoscopy;  Laterality: N/A;  . ESOPHAGOGASTRODUODENOSCOPY (EGD) WITH PROPOFOL N/A 09/30/2018   Procedure: ESOPHAGOGASTRODUODENOSCOPY (EGD) WITH PROPOFOL;  Surgeon: Mauri Pole, MD;  Location: [place] ENDOSCOPY;  Service: Endoscopy;  Laterality: N/A;  . INSERTION OF DIALYSIS CATHETER Left 03/16/2018   Procedure: PLACEMENT OF TUNNEL DIALYSIS CATHETER;  Surgeon: Waynetta Sandy, MD;  Location: [place];  Service: Vascular;  Laterality: Left;  . INSERTION OF DIALYSIS CATHETER Right 09/26/2018   Procedure: INSERTION OF DIALYSIS  TUNNELED CATHETER;  Surgeon: Elam Dutch, MD;  Location: [facility] OR;  Service: Vascular;  Laterality: Right;  . IR REMOVAL TUN CV CATH W/O FL  09/17/2018  . REVISON OF ARTERIOVENOUS FISTULA Right 01/28/2017   Procedure: REVISON OF RIGHT ARM ARTERIOVENOUS FISTULA USING 8MMX10CM GORETEX GRAFT;  Surgeon: Angelia Mould, MD;  Location: [place];  Service: Vascular;  Laterality: Right;    Social History:  reports that she has been smoking cigarettes. She has a 10.00 pack-year smoking history. She has never used smokeless tobacco. She reports previous alcohol use of about 1.0 standard drinks of alcohol per week. She reports previous drug use. Drug: Marijuana.  Family History:  Family History  Problem Relation Age of Onset  . Lupus Mother   . Diabetes Father   . Heart attack Father      Prior to Admission medications   Medication Sig Start Date End Date Taking? Authorizing Provider  acetaminophen (TYLENOL)  500 MG tablet Take 500-1,000 mg by mouth every 6 (six) hours as needed for headache (pain).    [provider]  albuterol (PROVENTIL HFA;VENTOLIN HFA) 108 (90 Base) MCG/ACT inhaler TAKE 2 PUFFS BY MOUTH EVERY 6 HOURS AS NEEDED FOR WHEEZE OR SHORTNESS OF BREATH Patient not taking: Reported on 10/18/2018 05/09/18   [name], MD  albuterol (PROVENTIL) (2.5 MG/3ML) 0.083% nebulizer solution Take 3 mLs (2.5 mg total) by nebulization every 6 (six) hours as needed for wheezing or shortness of breath. MUST MAKE APPT FOR FURTHER REFILLS 08/15/18   [name], MD  Amino Acids-Protein Hydrolys (FEEDING SUPPLEMENT, PRO-STAT SUGAR FREE 64,) LIQD Take 30 mLs by mouth 2 (two) times daily. 09/19/18   Hosie Poisson, MD  amLODipine (NORVASC) 10 MG tablet Take 1 tablet (10 mg total) by mouth daily. Patient taking differently: Take 10 mg by mouth at bedtime.  05/16/18   Thurnell Lose, MD  b complex-vitamin c-folic acid (NEPHRO-VITE) 0.8 MG TABS tablet Take 1 tablet by mouth every evening. 07/27/18   [provider]  calcium acetate (PHOSLO) 667 MG capsule Take 1,334 mg by mouth 3 (three) times daily with meals.    [provider]  cinacalcet (SENSIPAR) 30 MG tablet TAKE 1 TABLET BY MOUTH DAILY EVERY EVENING (DO NOT  TAKE LESS THAN 12 HOURS PRIOR TO DIALYSIS) Patient taking differently: Take 30 mg by mouth daily with supper. (DO NOT TAKE LESS THAN 12 HOURS PRIOR TO DIALYSIS) 09/19/18   Hosie Poisson, MD  ferric citrate (AURYXIA) 1 GM 210 MG(Fe) tablet Take 420 mg by mouth 3 (three) times daily with meals.    [provider]  metoprolol tartrate (LOPRESSOR) 25 MG tablet Take 25 mg by mouth 2 (two) times daily.    [provider]  Nutritional Supplements (FEEDING SUPPLEMENT, NEPRO CARB STEADY,) LIQD Take 237 mLs by mouth 2 (two) times daily between meals. Patient not taking: Reported on 10/09/2018 10/01/18   Mercy Riding, MD  oxyCODONE-acetaminophen  (PERCOCET/ROXICET) 5-325 MG tablet Take 1 tablet by mouth every 6 (six) hours as needed for severe pain. Patient not taking: Reported on 10/09/2018 09/26/18   Elam Dutch, MD  pantoprazole (PROTONIX) 40 MG tablet Take 1 tablet (40 mg total) by mouth 2 (two) times daily. 10/01/18   Mercy Riding, MD  sucralfate (CARAFATE) 1 g tablet Take 1 g by mouth 4 (four) times daily -  with meals and at bedtime.    [provider]    Physical Exam: Vitals:   11/27/18 0545 11/27/18 0549 11/27/18 0600 11/27/18 0615  BP: (!) 181/114 (!) 181/114 (!) 183/115 (!) 186/110  Pulse:  93 94 92  Resp: (!) 27 (!) 26 (!) 26 (!) 26  Temp:      TempSrc:      SpO2:  100% 100% 100%  Weight:      Height:       General: Not in acute distress HEENT:       Eyes: PERRL, EOMI, no scleral icterus.       ENT: No discharge from the ears and nose, no pharynx injection, no tonsillar enlargement.        Neck: positive JVD, no bruit, no mass felt. Heme: No neck lymph node enlargement. Cardiac: S1/S2, RRR, No murmurs, No gallops or rubs. Respiratory:  No rales, wheezing, rhonchi or rubs. GI: Soft, nondistended, nontender, no rebound pain, no organomegaly, BS present. GU: No hematuria Ext: has trace leg edema bilaterally. 2+DP/PT pulse bilaterally. Musculoskeletal: No joint deformities, No joint redness or warmth, no limitation of ROM in spin. Skin: No rashes.  Neuro: Alert, oriented X3, cranial nerves II-XII grossly intact, moves all extremities normally.  Psych: Patient is not psychotic, no suicidal or hemocidal ideation.  Labs on Admission: I have personally reviewed following labs and imaging studies  CBC: Recent Labs  Lab 11/27/18 0339 11/27/18 0408  WBC 12.3*  --   NEUTROABS 6.4  --   HGB 12.7 14.3  HCT 40.2 42.0  MCV 90.7  --   PLT 229  --    Basic Metabolic Panel: Recent Labs  Lab 11/27/18 0339 11/27/18 0408  NA 138 137  K 5.6* 5.1  CL 100  --   CO2 20*  --   GLUCOSE 125*  --   BUN 39*   --   CREATININE 9.33*  --   CALCIUM 8.9  --    GFR: Estimated Creatinine Clearance: 5.4 mL/min (A) (by C-G formula based on SCr of 9.33 mg/dL (H)). Liver Function Tests: No results for input(s): AST, ALT, ALKPHOS, BILITOT, PROT, ALBUMIN in the last 168 hours. No results for input(s): LIPASE, AMYLASE in the last 168 hours. No results for input(s): AMMONIA in the last 168 hours. Coagulation Profile: No results for input(s): INR, PROTIME in the  last 168 hours. Cardiac Enzymes: No results for input(s): CKTOTAL, CKMB, CKMBINDEX, TROPONINI in the last 168 hours. BNP (last 3 results) No results for input(s): PROBNP in the last 8760 hours. HbA1C: No results for input(s): HGBA1C in the last 72 hours. CBG: No results for input(s): GLUCAP in the last 168 hours. Lipid Profile: No results for input(s): CHOL, HDL, LDLCALC, TRIG, CHOLHDL, LDLDIRECT in the last 72 hours. Thyroid Function Tests: No results for input(s): TSH, T4TOTAL, FREET4, T3FREE, THYROIDAB in the last 72 hours. Anemia Panel: No results for input(s): VITAMINB12, FOLATE, FERRITIN, TIBC, IRON, RETICCTPCT in the last 72 hours. Urine analysis:    Component Value Date/Time   COLORURINE YELLOW 11/26/2013 1819   APPEARANCEUR TURBID (A) 11/26/2013 1819   LABSPEC 1.021 11/26/2013 1819   PHURINE 5.5 11/26/2013 1819   GLUCOSEU NEGATIVE 11/26/2013 1819   HGBUR SMALL (A) 11/26/2013 1819   BILIRUBINUR NEGATIVE 11/26/2013 1819   KETONESUR NEGATIVE 11/26/2013 1819   PROTEINUR >300 (A) 11/26/2013 1819   UROBILINOGEN 0.2 11/26/2013 1819   NITRITE POSITIVE (A) 11/26/2013 1819   LEUKOCYTESUR MODERATE (A) 11/26/2013 1819   Sepsis Labs: @LABRCNTIP (procalcitonin:4,lacticidven:4) ) Recent Results (from the past 240 hour(s))  SARS Coronavirus 2 (CEPHEID - Performed in [place] hospital lab), Hosp Order     Status: None   Collection Time: 11/27/18  3:23 AM   Specimen: Nasopharyngeal Swab  Result Value Ref Range Status   SARS Coronavirus  2 NEGATIVE NEGATIVE Final    Comment: (NOTE) If result is NEGATIVE SARS-CoV-2 target nucleic acids are NOT DETECTED. The SARS-CoV-2 RNA is generally detectable in upper and lower  respiratory specimens during the acute phase of infection. The lowest  concentration of SARS-CoV-2 viral copies this assay can detect is 250  copies / mL. A negative result does not preclude SARS-CoV-2 infection  and should not be used as the sole basis for treatment or other  patient management decisions.  A negative result may occur with  improper specimen collection / handling, submission of specimen other  than nasopharyngeal swab, presence of viral mutation(s) within the  areas targeted by this assay, and inadequate number of viral copies  (<250 copies / mL). A negative result must be combined with clinical  observations, patient history, and epidemiological information. If result is POSITIVE SARS-CoV-2 target nucleic acids are DETECTED. The SARS-CoV-2 RNA is generally detectable in upper and lower  respiratory specimens dur ing the acute phase of infection.  Positive  results are indicative of active infection with SARS-CoV-2.  Clinical  correlation with patient history and other diagnostic information is  necessary to determine patient infection status.  Positive results do  not rule out bacterial infection or co-infection with other viruses. If result is PRESUMPTIVE POSTIVE SARS-CoV-2 nucleic acids MAY BE PRESENT.   A presumptive positive result was obtained on the submitted specimen  and confirmed on repeat testing.  While 2019 novel coronavirus  (SARS-CoV-2) nucleic acids may be present in the submitted sample  additional confirmatory testing may be necessary for epidemiological  and / or clinical management purposes  to differentiate between  SARS-CoV-2 and other Sarbecovirus currently known to infect humans.  If clinically indicated additional testing with an alternate test  methodology  [phone]) is advised. The SARS-CoV-2 RNA is generally  detectable in upper and lower respiratory sp ecimens during the acute  phase of infection. The expected result is Negative. Fact Sheet for Patients:  StrictlyIdeas.no Fact Sheet for Healthcare Providers: BankingDealers.co.za This test is not yet approved or  cleared by the [place] and has been authorized for detection and/or diagnosis of SARS-CoV-2 by FDA under an Emergency Use Authorization (EUA).  This EUA will remain in effect (meaning this test can be used) for the duration of the COVID-19 declaration under Section 564(b)(1) of the Act, 21 U.S.C. section 360bbb-3(b)(1), unless the authorization is terminated or revoked sooner. Performed at [facility], [place] [address], [place], [place] [address]      Radiological Exams on Admission: Dg Chest Port 1 View  Result Date: 11/27/2018 CLINICAL DATA:  Shortness of Breath EXAM: PORTABLE CHEST 1 VIEW COMPARISON:  10/05/2018 FINDINGS: Mild cardiomegaly. Mild vascular congestion. Perihilar and lower lobe opacities noted on the left. Small right pleural effusion. No acute bony abnormality. Right dialysis catheter is unchanged. IMPRESSION: Mild cardiomegaly, vascular congestion. Left perihilar and lower lobe airspace opacity could reflect asymmetric edema or infection. Electronically Signed   By: Rolm Baptise M.D.   On: 11/27/2018 03:58     EKG: Independently reviewed.  Sinus rhythm tachycardia, QTC 435, LAD, T wave inversion in V4-V6 which is similar to previous EKG, no T wave peaking in.  Assessment/Plan Principal Problem:   Fluid overload Active Problems:   Gastric ulcer   HTN (hypertension)   ESRD on dialysis (H[facility])   Hyperkalemia   Hypertensive urgency   Tobacco abuse   Acute on chronic diastolic CHF (congestive heart failure) (HCC)   Acute respiratory failure with hypoxia (H[facility])   Leukocytosis  Acute respiratory  failure with hypoxia due to fluid overload and acute on chronic diastolic CHF: 2D echo on 70/17/7939 showed EF of 55-60% with grade 1 diastolic dysfunction.  Patient missed dialysis on Saturday.  She has positive JVD and vascular congestion on chest x-ray, consistent with fluid overload.  Blood pressure significantly elevated at 204/136.  -will place on SUD for obs -start IV nitroglycerin drip -Continue BiPAP -PRN albuterol inhaler -ED physician will consult renal for urgent dialysis  Gastric ulcer: -Continue Protonix and Carafate  HTN (hypertension) and Hypertensive urgency: Blood pressure two 4/136.  Patient is not compliant to her blood pressure medications -Amlodipine, metoprolol -will start nitroglycerin drip  ESRD on dialysis (TTS): not completely compliant to HD. Potassium 5.6, bicarbonate 20, creatinine 9.33, BUN 39 -renal will be consulted by EDP (Dr. Leonette Monarch)  Hyperkalemia: K5.6. No T wave peaking. -HD per renal  Tobacco abuse: -nicotine patch  Leukocytosis: WBC 12.3. no fever.  Possibly due to reactive response -Follow-up by CBC    DVT ppx: SQ Heparin    Code Status: Full code Family Communication: None at bed side.   Disposition Plan:  Anticipate discharge back to previous home environment Consults called:  EDP will consult renal for urgent dialysis Admission status:  SDU/obs  Date of Service 11/27/2018    [facility]   If 7PM-7AM, please contact night-coverage [email or web address] Password [facility] 11/27/2018, 6:19 AM

## 2018-11-27 NOTE — ED Provider Notes (Addendum)
[facility] EMERGENCY DEPARTMENT Provider Note   CSN: [id number] Arrival date & time: 11/27/18  6295     History   Chief Complaint Chief Complaint  Patient presents with  . Shortness of Breath    HPI Karen Morrow is a 58 y.o. female.     The history is provided by the patient and medical records.  Shortness of Breath    58 year old female with history of asthma, end-stage renal disease on hemodialysis, GERD, hepatitis C, hypertension, presenting to the ED with shortness of breath upon waking.  Patient usually dialyzes on Tuesday, Thursday, and Saturday.  States she missed her treatment on Saturday "just because".  States she has not had any cough or fever.  She has not had any sick contacts.  She has had COVID testing x2 that has been negative.  EMS reported O2 sats of 86% on room air, patient generally does not require oxygen.  Past Medical History:  Diagnosis Date  . Anginal pain ([place])   . Asthma   . CHF (congestive heart failure) ([place])   . Chronic kidney disease    dialysis 3x wk  . Dyspnea   . Frequent bowel movements   . GERD (gastroesophageal reflux disease)   . Hepatitis C antibody test positive   . Hypertension    "just dx'd today" (11/03/2012)    Patient Active Problem List   Diagnosis Date Noted  . Hiatal hernia   . Gastrointestinal bleeding 09/29/2018  . GI bleed 09/29/2018  . Chronic hepatitis C ([place]) 09/23/2018  . Symptomatic anemia 09/16/2018  . Severe systemic inflammatory response syndrome (SIRS) ([place]) 09/16/2018  . Altered mental status 09/16/2018  . Tobacco abuse 09/16/2018  . Chronic diastolic (congestive) heart failure ([place]) 07/07/2018  . Aortic insufficiency 07/07/2018  . Hypertensive urgency 05/13/2018  . Infection of arteriovenous fistula ([place]) 04/25/2018  . MSSA bacteremia 04/25/2018  . Bacteremia   . Staphylococcal sepsis ([place])   . Sepsis ([place]) 03/10/2018  . Hyperkalemia   . Acute respiratory distress   .  Pulmonary hypertension due to left ventricular diastolic dysfunction ([place]) 11/08/2016  . Chronic cough 10/15/2016  . Acute GI bleeding 09/07/2016  . Gastroesophageal reflux disease 04/16/2016  . Anemia in chronic kidney disease, on chronic dialysis ([place]) 04/16/2016  . Primary insomnia 04/16/2016  . Shortness of breath 04/16/2016  . Pruritus 12/11/2014  . Hordeolum externum (stye) 12/11/2014  . Xanthelasma of right upper eyelid 12/11/2014  . Dermatitis 10/30/2014  . ESRD (end stage renal disease) ([place]) 02/25/2014  . Malnutrition of moderate degree ([place]) 11/28/2013  . HTN (hypertension) 11/26/2013  . Gastric ulcer 06/07/2013  . Abdominal pain 11/03/2012  . Chest pain 11/03/2012  . Thrombocytopenia ([place]) 11/03/2012  . Elevated transaminase level 11/03/2012  . Hepatic cirrhosis ([place]) 11/03/2012  . Hepatitis C antibody test positive     Past Surgical History:  Procedure Laterality Date  . ABDOMINAL HYSTERECTOMY     "partial" (11/03/2012)  . AV FISTULA PLACEMENT Right 12/03/2013   Procedure: CREATION OF ARTERIOVENOUS (AV) FISTULA RIGHT ARM ;  Surgeon: Rosetta Posner, MD;  Location: [facility];  Service: Vascular;  Laterality: Right;  . AV FISTULA PLACEMENT Left 05/17/2018   Procedure: CREATION OF BRACHIOCEPHALIC FISTULA LEFT ARM;  Surgeon: Waynetta Sandy, MD;  Location: [facility];  Service: Vascular;  Laterality: Left;  . [place] REMOVAL Right 03/16/2018   Procedure: REMOVAL OF ARTERIOVENOUS GORETEX GRAFT ([place]) RIGHT ARM;  Surgeon: Waynetta Sandy, MD;  Location: [facility];  Service: Vascular;  Laterality: Right;  . BASCILIC VEIN TRANSPOSITION Right 03/06/2014   Procedure: SECOND STAGE BASILIC VEIN TRANSPOSITION;  Surgeon: Rosetta Posner, MD;  Location: [facility];  Service: Vascular;  Laterality: Right;  . BIOPSY  09/30/2018   Procedure: BIOPSY;  Surgeon: Mauri Pole, MD;  Location[facility];  Service: Endoscopy;;  . ENTEROSCOPY N/A 10/06/2018   Procedure: ENTEROSCOPY;  Surgeon:  Lavena Bullion, DO;  Location: [facility];  Service: Gastroenterology;  Laterality: N/A;  . ESOPHAGOGASTRODUODENOSCOPY N/A 06/05/2013   Procedure: ESOPHAGOGASTRODUODENOSCOPY (EGD);  Surgeon: Winfield Cunas., MD;  Location: [facility];  Service: Endoscopy;  Laterality: N/A;  . ESOPHAGOGASTRODUODENOSCOPY N/A 09/08/2016   Procedure: ESOPHAGOGASTRODUODENOSCOPY (EGD);  Surgeon: Milus Banister, MD;  Location: [facility];  Service: Endoscopy;  Laterality: N/A;  . ESOPHAGOGASTRODUODENOSCOPY (EGD) WITH PROPOFOL N/A 09/17/2018   Procedure: ESOPHAGOGASTRODUODENOSCOPY (EGD) WITH PROPOFOL;  Surgeon: Carol Ada, MD;  Location: [facility];  Service: Endoscopy;  Laterality: N/A;  . ESOPHAGOGASTRODUODENOSCOPY (EGD) WITH PROPOFOL N/A 09/30/2018   Procedure: ESOPHAGOGASTRODUODENOSCOPY (EGD) WITH PROPOFOL;  Surgeon: Mauri Pole, MD;  Location: [facility] ENDOSCOPY;  Service: Endoscopy;  Laterality: N/A;  . INSERTION OF DIALYSIS CATHETER Left 03/16/2018   Procedure: PLACEMENT OF TUNNEL DIALYSIS CATHETER;  Surgeon: Waynetta Sandy, MD;  Location: [facility];  Service: Vascular;  Laterality: Left;  . INSERTION OF DIALYSIS CATHETER Right 09/26/2018   Procedure: INSERTION OF DIALYSIS  TUNNELED CATHETER;  Surgeon: Elam Dutch, MD;  Location: [facility] OR;  Service: Vascular;  Laterality: Right;  . IR REMOVAL TUN CV CATH W/O FL  09/17/2018  . REVISON OF ARTERIOVENOUS FISTULA Right 01/28/2017   Procedure: REVISON OF RIGHT ARM ARTERIOVENOUS FISTULA USING 8MMX10CM GORETEX GRAFT;  Surgeon: Angelia Mould, MD;  Location: [facility];  Service: Vascular;  Laterality: Right;     OB History   No obstetric history on file.      Home Medications    Prior to Admission medications   Medication Sig Start Date End Date Taking? Authorizing Provider  acetaminophen (TYLENOL) 500 MG tablet Take 500-1,000 mg by mouth every 6 (six) hours as needed for headache (pain).    [provider]  albuterol (PROVENTIL  HFA;VENTOLIN HFA) 108 (90 Base) MCG/ACT inhaler TAKE 2 PUFFS BY MOUTH EVERY 6 HOURS AS NEEDED FOR WHEEZE OR SHORTNESS OF BREATH Patient not taking: Reported on 10/18/2018 05/09/18   [name], MD  albuterol (PROVENTIL) (2.5 MG/3ML) 0.083% nebulizer solution Take 3 mLs (2.5 mg total) by nebulization every 6 (six) hours as needed for wheezing or shortness of breath. MUST MAKE APPT FOR FURTHER REFILLS 08/15/18   [name], MD  Amino Acids-Protein Hydrolys (FEEDING SUPPLEMENT, PRO-STAT SUGAR FREE 64,) LIQD Take 30 mLs by mouth 2 (two) times daily. 09/19/18   Hosie Poisson, MD  amLODipine (NORVASC) 10 MG tablet Take 1 tablet (10 mg total) by mouth daily. Patient taking differently: Take 10 mg by mouth at bedtime.  05/16/18   Thurnell Lose, MD  b complex-vitamin c-folic acid (NEPHRO-VITE) 0.8 MG TABS tablet Take 1 tablet by mouth every evening. 07/27/18   [provider]  calcium acetate (PHOSLO) 667 MG capsule Take 1,334 mg by mouth 3 (three) times daily with meals.    [provider]  cinacalcet (SENSIPAR) 30 MG tablet TAKE 1 TABLET BY MOUTH DAILY EVERY EVENING (DO NOT TAKE LESS THAN 12 HOURS PRIOR TO DIALYSIS) Patient taking differently: Take 30 mg by mouth daily with supper. (DO NOT TAKE  LESS THAN 12 HOURS PRIOR TO DIALYSIS) 09/19/18   Hosie Poisson, MD  ferric citrate (AURYXIA) 1 GM 210 MG(Fe) tablet Take 420 mg by mouth 3 (three) times daily with meals.    [provider]  metoprolol tartrate (LOPRESSOR) 25 MG tablet Take 25 mg by mouth 2 (two) times daily.    [provider]  Nutritional Supplements (FEEDING SUPPLEMENT, NEPRO CARB STEADY,) LIQD Take 237 mLs by mouth 2 (two) times daily between meals. Patient not taking: Reported on 10/09/2018 10/01/18   Mercy Riding, MD  oxyCODONE-acetaminophen (PERCOCET/ROXICET) 5-325 MG tablet Take 1 tablet by mouth every 6 (six) hours as needed for severe pain. Patient not taking: Reported on 10/09/2018 09/26/18    Elam Dutch, MD  pantoprazole (PROTONIX) 40 MG tablet Take 1 tablet (40 mg total) by mouth 2 (two) times daily. 10/01/18   Mercy Riding, MD  sucralfate (CARAFATE) 1 g tablet Take 1 g by mouth 4 (four) times daily -  with meals and at bedtime.    [provider]    Family History Family History  Problem Relation Age of Onset  . Lupus Mother   . Diabetes Father   . Heart attack Father     Social History Social History   Tobacco Use  . Smoking status: Light Tobacco Smoker    Packs/day: 0.50    Years: 20.00    Pack years: 10.00    Types: Cigarettes  . Smokeless tobacco: Never Used  Substance Use Topics  . Alcohol use: Not Currently    Alcohol/week: 1.0 standard drinks    Types: 1 Glasses of wine per week  . Drug use: Not Currently    Types: Marijuana     Allergies   Cefazolin   Review of Systems Review of Systems  Respiratory: Positive for shortness of breath.   All other systems reviewed and are negative.    Physical Exam Updated Vital Signs BP (!) 174/113   Pulse (!) 143   Temp (!) 97.5 F (36.4 C) (Oral)   Resp (!) 27   Ht 5\' 3"  (1.6 m)   Wt 54.4 kg   SpO2 99%   BMI 21.26 kg/m   Physical Exam Vitals signs and nursing note reviewed.  Constitutional:      Appearance: She is well-developed. She is diaphoretic.  HENT:     Head: Normocephalic and atraumatic.  Eyes:     Conjunctiva/sclera: Conjunctivae normal.     Pupils: Pupils are equal, round, and reactive to light.  Neck:     Musculoskeletal: Normal range of motion.     Comments: JVD Cardiovascular:     Rate and Rhythm: Regular rhythm. Tachycardia present.     Heart sounds: Normal heart sounds.  Pulmonary:     Effort: Tachypnea, accessory muscle usage and respiratory distress present.     Breath sounds: Rales present.     Comments: Tachypneic, accessory muscle use, able to speak in short 2-3 word phrases. Abdominal:     General: Bowel sounds are normal.     Palpations: Abdomen is  soft.  Musculoskeletal: Normal range of motion.  Skin:    General: Skin is warm.  Neurological:     Mental Status: She is alert and oriented to person, place, and time.      ED Treatments / Results  Labs (all labs ordered are listed, but only abnormal results are displayed) Labs Reviewed  CBC WITH DIFFERENTIAL/PLATELET - Abnormal; Notable for the following components:  Result Value   WBC 12.3 (*)    Lymphs Abs 4.5 (*)    Abs Immature Granulocytes 0.08 (*)    All other components within normal limits  BASIC METABOLIC PANEL - Abnormal; Notable for the following components:   Potassium 5.6 (*)    CO2 20 (*)    Glucose, Bld 125 (*)    BUN 39 (*)    Creatinine, Ser 9.33 (*)    GFR calc non Af Amer 4 (*)    GFR calc Af Amer 5 (*)    Anion gap 18 (*)    All other components within normal limits  POCT I-STAT EG7 - Abnormal; Notable for the following components:   pH, Ven 7.246 (*)    pO2, Ven 48.0 (*)    Acid-base deficit 6.0 (*)    Calcium, Ion 1.04 (*)    All other components within normal limits  SARS CORONAVIRUS 2 (HOSPITAL ORDER, [place] [facility])    EKG EKG Interpretation  Date/Time:  Monday November 27 2018 03:22:19 EDT Ventricular Rate:  148 PR Interval:    QRS Duration: 87 QT Interval:  277 QTC Calculation: 435 R Axis:   16 Text Interpretation:  Sinus tachycardia Abnormal R-wave progression, early transition LVH with secondary repolarization abnormality Otherwise no significant change Confirmed by Addison Lank [phone]) on 11/27/2018 3:27:38 AM   Radiology Dg Chest Port 1 View  Result Date: 11/27/2018 CLINICAL DATA:  Shortness of Breath EXAM: PORTABLE CHEST 1 VIEW COMPARISON:  10/05/2018 FINDINGS: Mild cardiomegaly. Mild vascular congestion. Perihilar and lower lobe opacities noted on the left. Small right pleural effusion. No acute bony abnormality. Right dialysis catheter is unchanged. IMPRESSION: Mild cardiomegaly, vascular congestion.  Left perihilar and lower lobe airspace opacity could reflect asymmetric edema or infection. Electronically Signed   By: Rolm Baptise M.D.   On: 11/27/2018 03:58    Procedures Procedures (including critical care time)  CRITICAL CARE Performed by: Larene Pickett   Total critical care time: 45 minutes  Critical care time was exclusive of separately billable procedures and treating other patients.  Critical care was necessary to treat or prevent imminent or life-threatening deterioration.  Critical care was time spent personally by me on the following activities: development of treatment plan with patient and/or surrogate as well as nursing, discussions with consultants, evaluation of patient's response to treatment, examination of patient, obtaining history from patient or surrogate, ordering and performing treatments and interventions, ordering and review of laboratory studies, ordering and review of radiographic studies, pulse oximetry and re-evaluation of patient's condition.   Medications Ordered in ED Medications  nitroGLYCERIN (NITROSTAT) SL tablet 0.4 mg (has no administration in time range)     Initial Impression / Assessment and Plan / ED Course  I have reviewed the triage vital signs and the nursing notes.  Pertinent labs & imaging results that were available during my care of the patient were reviewed by me and considered in my medical decision making (see chart for details).  58 year old female here with shortness of breath.  She missed dialysis on Saturday.  She has not had any fevers or other illness.  On exam patient is clinically fluid overloaded.  She is tachypneic, tachycardic, with increased work of breathing and rales throughout.  She does have JVD.  Based on chart review she has had COVID testing x2 that was negative.  I discussed this with respiratory, they have agreed to initiate BiPAP.  She will require admission.  Labs and  chest x-ray are pending.  COVID screen  obtained.  She will also be given NTG SL to try and help reduce preload.  3:55 AM Patient has received nitroglycerin and has been on BiPAP for about 10 to 15 minutes.  She already appears improved, tolerating mask well.  We will continue to monitor closely.  4:18 AM Continues doing well on bipap.  BP not responding much to NTG.  CXR with vascular congestion.  K+ 5.1 without EKG changes.  COVID test pending.  Patient will require admission  Dr. Blaine Hamper with hospitalist service will admit for ongoing care.  Nephrology consulted to help arrange dialysis.  Final Clinical Impressions(s) / ED Diagnoses   Final diagnoses:  Hypervolemia associated with renal insufficiency  ESRD on hemodialysis [facility])  Hypoxia    ED Discharge Orders    None       Larene Pickett, PA-C 11/27/18 0700    Larene Pickett, PA-C 11/27/18 0700    Fatima Blank, MD 11/28/18 [phone]

## 2018-11-27 NOTE — ED Notes (Signed)
Attempt to give report to 6 E.  Nurse requests a call back.

## 2018-11-28 ENCOUNTER — Other Ambulatory Visit: Payer: Self-pay | Admitting: *Deleted

## 2018-11-28 DIAGNOSIS — D509 Iron deficiency anemia, unspecified: Secondary | ICD-10-CM | POA: Diagnosis not present

## 2018-11-28 DIAGNOSIS — D631 Anemia in chronic kidney disease: Secondary | ICD-10-CM | POA: Diagnosis not present

## 2018-11-28 DIAGNOSIS — T80219A Unspecified infection due to central venous catheter, initial encounter: Secondary | ICD-10-CM | POA: Diagnosis not present

## 2018-11-28 DIAGNOSIS — N2581 Secondary hyperparathyroidism of renal origin: Secondary | ICD-10-CM | POA: Diagnosis not present

## 2018-11-28 DIAGNOSIS — Z992 Dependence on renal dialysis: Secondary | ICD-10-CM | POA: Diagnosis not present

## 2018-11-28 DIAGNOSIS — N186 End stage renal disease: Secondary | ICD-10-CM | POA: Diagnosis not present

## 2018-11-28 NOTE — Patient Outreach (Addendum)
[facility]) Care Management  11/28/2018  Karen Morrow 05/08/1961 [id number]  Transition of care telephone call  Referral received: 11/28/18 Initial outreach: 11/28/18 Insurance: [place]  Initial unsuccessful telephone call to patient's preferred (mobile) number in order to complete transition of care assessment; no answer, left HIPAA compliant voicemail message requesting return call.   Objective: Per the electronic medical record, Karen Morrow was hospitalized at [facility] from 6/29-6/29/20 with fluid overload, and pulmonary edema after she missed an outpatient dialysis treatment.  Comorbidities include:chronic diastolic heart failure, HTN, cirrhosis of liver, GERD, gastric ulcer, ESRD on HD, Hepatitis C, malnutrition, and tobacco abuse.  Plan: This RNCM will route unsuccessful outreach letter with [place] Management pamphlet and 24 hour Nurse Advice Line Magnet to [facility] Management clinical pool to be mailed to patient's home address. This RNCM will attempt another outreach within 4 business days.  Barrington Ellison RN,CCM,CDE [place] Management Coordinator Office Phone [phone] Office Fax [phone]

## 2018-11-29 DIAGNOSIS — Z992 Dependence on renal dialysis: Secondary | ICD-10-CM | POA: Diagnosis not present

## 2018-11-29 DIAGNOSIS — I120 Hypertensive chronic kidney disease with stage 5 chronic kidney disease or end stage renal disease: Secondary | ICD-10-CM | POA: Diagnosis not present

## 2018-11-29 DIAGNOSIS — N186 End stage renal disease: Secondary | ICD-10-CM | POA: Diagnosis not present

## 2018-11-30 ENCOUNTER — Ambulatory Visit: Payer: Self-pay | Admitting: *Deleted

## 2018-11-30 ENCOUNTER — Other Ambulatory Visit: Payer: Self-pay | Admitting: *Deleted

## 2018-11-30 DIAGNOSIS — D509 Iron deficiency anemia, unspecified: Secondary | ICD-10-CM | POA: Diagnosis not present

## 2018-11-30 DIAGNOSIS — T8249XD Other complication of vascular dialysis catheter, subsequent encounter: Secondary | ICD-10-CM | POA: Diagnosis not present

## 2018-11-30 DIAGNOSIS — N2581 Secondary hyperparathyroidism of renal origin: Secondary | ICD-10-CM | POA: Diagnosis not present

## 2018-11-30 DIAGNOSIS — D631 Anemia in chronic kidney disease: Secondary | ICD-10-CM | POA: Diagnosis not present

## 2018-11-30 DIAGNOSIS — K7460 Unspecified cirrhosis of liver: Secondary | ICD-10-CM | POA: Diagnosis not present

## 2018-11-30 DIAGNOSIS — Z992 Dependence on renal dialysis: Secondary | ICD-10-CM | POA: Diagnosis not present

## 2018-11-30 DIAGNOSIS — E875 Hyperkalemia: Secondary | ICD-10-CM | POA: Diagnosis not present

## 2018-11-30 DIAGNOSIS — N186 End stage renal disease: Secondary | ICD-10-CM | POA: Diagnosis not present

## 2018-11-30 NOTE — Patient Outreach (Addendum)
[facility]) Care Management  11/30/2018  Karen Morrow 12-14-1960 [id number]  Transition of care telephone call  Referral received: 11/28/18 Initial outreach: 11/28/18 Insurance: [employer]  Second  unsuccessful telephone outreach to patient's preferred (mobile) number in order to complete post hospital discharge transition of care assessment; no answer, left HIPAA compliant voicemail message requesting return call.   Objective: Per the electronic medical record, Karen Morrow was hospitalized at [facility] from 6/29-6/29/20 with fluid overload and pulmonary edema after she missed an outpatient dialysis treatment.  Comorbidities include:chronic diastolic heart failure, HTN,cirrhosis of liver, GERD, gastric ulcer, ESRD on HD, Hepatitis C, malnutrition, and tobacco abuse.  Plan: RNCM will attempt another outreach within 4 business days.  Barrington Ellison RN,CCM,CDE [place] Management Coordinator Office Phone [phone] Office Fax [phone]

## 2018-12-02 DIAGNOSIS — D631 Anemia in chronic kidney disease: Secondary | ICD-10-CM | POA: Diagnosis not present

## 2018-12-02 DIAGNOSIS — E875 Hyperkalemia: Secondary | ICD-10-CM | POA: Diagnosis not present

## 2018-12-02 DIAGNOSIS — N2581 Secondary hyperparathyroidism of renal origin: Secondary | ICD-10-CM | POA: Diagnosis not present

## 2018-12-02 DIAGNOSIS — T8249XD Other complication of vascular dialysis catheter, subsequent encounter: Secondary | ICD-10-CM | POA: Diagnosis not present

## 2018-12-02 DIAGNOSIS — D509 Iron deficiency anemia, unspecified: Secondary | ICD-10-CM | POA: Diagnosis not present

## 2018-12-02 DIAGNOSIS — N186 End stage renal disease: Secondary | ICD-10-CM | POA: Diagnosis not present

## 2018-12-05 DIAGNOSIS — D509 Iron deficiency anemia, unspecified: Secondary | ICD-10-CM | POA: Diagnosis not present

## 2018-12-05 DIAGNOSIS — T8249XD Other complication of vascular dialysis catheter, subsequent encounter: Secondary | ICD-10-CM | POA: Diagnosis not present

## 2018-12-05 DIAGNOSIS — E875 Hyperkalemia: Secondary | ICD-10-CM | POA: Diagnosis not present

## 2018-12-05 DIAGNOSIS — N2581 Secondary hyperparathyroidism of renal origin: Secondary | ICD-10-CM | POA: Diagnosis not present

## 2018-12-05 DIAGNOSIS — Z992 Dependence on renal dialysis: Secondary | ICD-10-CM | POA: Diagnosis not present

## 2018-12-05 DIAGNOSIS — K7460 Unspecified cirrhosis of liver: Secondary | ICD-10-CM | POA: Diagnosis not present

## 2018-12-05 DIAGNOSIS — N186 End stage renal disease: Secondary | ICD-10-CM | POA: Diagnosis not present

## 2018-12-05 DIAGNOSIS — D631 Anemia in chronic kidney disease: Secondary | ICD-10-CM | POA: Diagnosis not present

## 2018-12-07 DIAGNOSIS — N186 End stage renal disease: Secondary | ICD-10-CM | POA: Diagnosis not present

## 2018-12-07 DIAGNOSIS — T8249XD Other complication of vascular dialysis catheter, subsequent encounter: Secondary | ICD-10-CM | POA: Diagnosis not present

## 2018-12-07 DIAGNOSIS — D631 Anemia in chronic kidney disease: Secondary | ICD-10-CM | POA: Diagnosis not present

## 2018-12-07 DIAGNOSIS — N2581 Secondary hyperparathyroidism of renal origin: Secondary | ICD-10-CM | POA: Diagnosis not present

## 2018-12-07 DIAGNOSIS — E875 Hyperkalemia: Secondary | ICD-10-CM | POA: Diagnosis not present

## 2018-12-07 DIAGNOSIS — D509 Iron deficiency anemia, unspecified: Secondary | ICD-10-CM | POA: Diagnosis not present

## 2018-12-08 ENCOUNTER — Other Ambulatory Visit: Payer: Self-pay | Admitting: *Deleted

## 2018-12-08 DIAGNOSIS — Z452 Encounter for adjustment and management of vascular access device: Secondary | ICD-10-CM | POA: Diagnosis not present

## 2018-12-08 NOTE — Patient Outreach (Signed)
[facility]) Care Management  12/08/2018  MADI BONFIGLIO 15-Mar-1961 [id number]   Subjective: Telephone call to patient's home number, spoke with patient's husband, states wife Alayssa Flinchum) is currently at home, and can be reached on mobile number.  Telephone call to patient's mobile number, no answer, left HIPAA compliant voicemail message, and requested call back.   Objective: Per KPN (Knowledge Performance Now, point of care tool) and chart review, patient hospitalized 11/27/2018 - 11/27/2018 for Pulmonary edema, Volume overload, due to missed dialysis.     Patient also has a history of ESRD on Hemodialysis, hypertension, asthma, diastolic congestive heart failure, and tobacco abuse.    Assessment: Received [facility] Transition of care referral on 11/28/2018.  Transition of care follow up pending patient contact.     Plan: Assigned RNCM has sent unsuccessful outreach letter, [facility] pamphlet, will proceed with case closure, within 10 business days if no return call.     Ahlia Lemanski H. Annia Friendly, BSN, [place] Management [facility] Telephonic CM Phone: [phone] Fax: [phone]

## 2018-12-09 DIAGNOSIS — D631 Anemia in chronic kidney disease: Secondary | ICD-10-CM | POA: Diagnosis not present

## 2018-12-09 DIAGNOSIS — D509 Iron deficiency anemia, unspecified: Secondary | ICD-10-CM | POA: Diagnosis not present

## 2018-12-09 DIAGNOSIS — N186 End stage renal disease: Secondary | ICD-10-CM | POA: Diagnosis not present

## 2018-12-09 DIAGNOSIS — N2581 Secondary hyperparathyroidism of renal origin: Secondary | ICD-10-CM | POA: Diagnosis not present

## 2018-12-09 DIAGNOSIS — T8249XD Other complication of vascular dialysis catheter, subsequent encounter: Secondary | ICD-10-CM | POA: Diagnosis not present

## 2018-12-09 DIAGNOSIS — E875 Hyperkalemia: Secondary | ICD-10-CM | POA: Diagnosis not present

## 2018-12-12 ENCOUNTER — Other Ambulatory Visit: Payer: Self-pay | Admitting: *Deleted

## 2018-12-12 DIAGNOSIS — E875 Hyperkalemia: Secondary | ICD-10-CM | POA: Diagnosis not present

## 2018-12-12 DIAGNOSIS — D509 Iron deficiency anemia, unspecified: Secondary | ICD-10-CM | POA: Diagnosis not present

## 2018-12-12 DIAGNOSIS — N2581 Secondary hyperparathyroidism of renal origin: Secondary | ICD-10-CM | POA: Diagnosis not present

## 2018-12-12 DIAGNOSIS — T8249XD Other complication of vascular dialysis catheter, subsequent encounter: Secondary | ICD-10-CM | POA: Diagnosis not present

## 2018-12-12 DIAGNOSIS — D631 Anemia in chronic kidney disease: Secondary | ICD-10-CM | POA: Diagnosis not present

## 2018-12-12 DIAGNOSIS — N186 End stage renal disease: Secondary | ICD-10-CM | POA: Diagnosis not present

## 2018-12-12 NOTE — Patient Outreach (Signed)
[facility]) Care Management  12/12/2018  LADELL Morrow 12/18/1960 [id number]  Transition of Care Case Closure- Unsuccessful Outreach Referral received:11/28/18 Initial outreach:11/28/18 Insurance: [employer]  Objective: Per the electronic medical record,Karen [facility]ettleswas hospitalized at [facility] from 6/29-6/29/20withfluid overload and pulmonary edema after she missed an outpatient dialysis treatment. Comorbidities include:chronic diastolic heart failure, HTN,cirrhosis of liver, GERD, gastric ulcer, ESRD on HD, Hepatitis C, malnutrition, and tobacco abuse.  Assessment: Unable to complete post hospital discharge transition of care assessment; no return call from patient after 3 attempts and no response to request to contact this RNCM in unsuccessful outreach letter mailed to patient's home on 11/28/18.   Plan: Case closed to [place] Management services as it has been 10 business days since initial post hospital discharge outreach attempt.  Barrington Ellison RN,CCM,CDE [place] Management Coordinator Office Phone [phone] Office Fax [phone]

## 2018-12-14 DIAGNOSIS — D631 Anemia in chronic kidney disease: Secondary | ICD-10-CM | POA: Diagnosis not present

## 2018-12-14 DIAGNOSIS — D509 Iron deficiency anemia, unspecified: Secondary | ICD-10-CM | POA: Diagnosis not present

## 2018-12-14 DIAGNOSIS — N186 End stage renal disease: Secondary | ICD-10-CM | POA: Diagnosis not present

## 2018-12-14 DIAGNOSIS — E875 Hyperkalemia: Secondary | ICD-10-CM | POA: Diagnosis not present

## 2018-12-14 DIAGNOSIS — N2581 Secondary hyperparathyroidism of renal origin: Secondary | ICD-10-CM | POA: Diagnosis not present

## 2018-12-14 DIAGNOSIS — T8249XD Other complication of vascular dialysis catheter, subsequent encounter: Secondary | ICD-10-CM | POA: Diagnosis not present

## 2018-12-16 DIAGNOSIS — E875 Hyperkalemia: Secondary | ICD-10-CM | POA: Diagnosis not present

## 2018-12-16 DIAGNOSIS — D631 Anemia in chronic kidney disease: Secondary | ICD-10-CM | POA: Diagnosis not present

## 2018-12-16 DIAGNOSIS — N186 End stage renal disease: Secondary | ICD-10-CM | POA: Diagnosis not present

## 2018-12-16 DIAGNOSIS — T8249XD Other complication of vascular dialysis catheter, subsequent encounter: Secondary | ICD-10-CM | POA: Diagnosis not present

## 2018-12-16 DIAGNOSIS — D509 Iron deficiency anemia, unspecified: Secondary | ICD-10-CM | POA: Diagnosis not present

## 2018-12-16 DIAGNOSIS — N2581 Secondary hyperparathyroidism of renal origin: Secondary | ICD-10-CM | POA: Diagnosis not present

## 2018-12-21 DIAGNOSIS — E875 Hyperkalemia: Secondary | ICD-10-CM | POA: Diagnosis not present

## 2018-12-21 DIAGNOSIS — K7460 Unspecified cirrhosis of liver: Secondary | ICD-10-CM | POA: Diagnosis not present

## 2018-12-21 DIAGNOSIS — N186 End stage renal disease: Secondary | ICD-10-CM | POA: Diagnosis not present

## 2018-12-21 DIAGNOSIS — D509 Iron deficiency anemia, unspecified: Secondary | ICD-10-CM | POA: Diagnosis not present

## 2018-12-21 DIAGNOSIS — T8249XD Other complication of vascular dialysis catheter, subsequent encounter: Secondary | ICD-10-CM | POA: Diagnosis not present

## 2018-12-21 DIAGNOSIS — D631 Anemia in chronic kidney disease: Secondary | ICD-10-CM | POA: Diagnosis not present

## 2018-12-21 DIAGNOSIS — N2581 Secondary hyperparathyroidism of renal origin: Secondary | ICD-10-CM | POA: Diagnosis not present

## 2018-12-21 DIAGNOSIS — Z992 Dependence on renal dialysis: Secondary | ICD-10-CM | POA: Diagnosis not present

## 2018-12-23 DIAGNOSIS — N186 End stage renal disease: Secondary | ICD-10-CM | POA: Diagnosis not present

## 2018-12-23 DIAGNOSIS — E875 Hyperkalemia: Secondary | ICD-10-CM | POA: Diagnosis not present

## 2018-12-23 DIAGNOSIS — D509 Iron deficiency anemia, unspecified: Secondary | ICD-10-CM | POA: Diagnosis not present

## 2018-12-23 DIAGNOSIS — N2581 Secondary hyperparathyroidism of renal origin: Secondary | ICD-10-CM | POA: Diagnosis not present

## 2018-12-23 DIAGNOSIS — T8249XD Other complication of vascular dialysis catheter, subsequent encounter: Secondary | ICD-10-CM | POA: Diagnosis not present

## 2018-12-23 DIAGNOSIS — D631 Anemia in chronic kidney disease: Secondary | ICD-10-CM | POA: Diagnosis not present

## 2018-12-26 DIAGNOSIS — N186 End stage renal disease: Secondary | ICD-10-CM | POA: Diagnosis not present

## 2018-12-26 DIAGNOSIS — T8249XD Other complication of vascular dialysis catheter, subsequent encounter: Secondary | ICD-10-CM | POA: Diagnosis not present

## 2018-12-26 DIAGNOSIS — N2581 Secondary hyperparathyroidism of renal origin: Secondary | ICD-10-CM | POA: Diagnosis not present

## 2018-12-26 DIAGNOSIS — D509 Iron deficiency anemia, unspecified: Secondary | ICD-10-CM | POA: Diagnosis not present

## 2018-12-26 DIAGNOSIS — D631 Anemia in chronic kidney disease: Secondary | ICD-10-CM | POA: Diagnosis not present

## 2018-12-26 DIAGNOSIS — E875 Hyperkalemia: Secondary | ICD-10-CM | POA: Diagnosis not present

## 2018-12-28 DIAGNOSIS — E875 Hyperkalemia: Secondary | ICD-10-CM | POA: Diagnosis not present

## 2018-12-28 DIAGNOSIS — Z992 Dependence on renal dialysis: Secondary | ICD-10-CM | POA: Diagnosis not present

## 2018-12-28 DIAGNOSIS — D631 Anemia in chronic kidney disease: Secondary | ICD-10-CM | POA: Diagnosis not present

## 2018-12-28 DIAGNOSIS — N186 End stage renal disease: Secondary | ICD-10-CM | POA: Diagnosis not present

## 2018-12-28 DIAGNOSIS — D509 Iron deficiency anemia, unspecified: Secondary | ICD-10-CM | POA: Diagnosis not present

## 2018-12-28 DIAGNOSIS — T8249XD Other complication of vascular dialysis catheter, subsequent encounter: Secondary | ICD-10-CM | POA: Diagnosis not present

## 2018-12-28 DIAGNOSIS — N2581 Secondary hyperparathyroidism of renal origin: Secondary | ICD-10-CM | POA: Diagnosis not present

## 2018-12-28 DIAGNOSIS — K7460 Unspecified cirrhosis of liver: Secondary | ICD-10-CM | POA: Diagnosis not present

## 2018-12-30 DIAGNOSIS — N2581 Secondary hyperparathyroidism of renal origin: Secondary | ICD-10-CM | POA: Diagnosis not present

## 2018-12-30 DIAGNOSIS — D631 Anemia in chronic kidney disease: Secondary | ICD-10-CM | POA: Diagnosis not present

## 2018-12-30 DIAGNOSIS — D509 Iron deficiency anemia, unspecified: Secondary | ICD-10-CM | POA: Diagnosis not present

## 2018-12-30 DIAGNOSIS — Z992 Dependence on renal dialysis: Secondary | ICD-10-CM | POA: Diagnosis not present

## 2018-12-30 DIAGNOSIS — I120 Hypertensive chronic kidney disease with stage 5 chronic kidney disease or end stage renal disease: Secondary | ICD-10-CM | POA: Diagnosis not present

## 2018-12-30 DIAGNOSIS — N186 End stage renal disease: Secondary | ICD-10-CM | POA: Diagnosis not present

## 2019-01-02 DIAGNOSIS — D509 Iron deficiency anemia, unspecified: Secondary | ICD-10-CM | POA: Diagnosis not present

## 2019-01-02 DIAGNOSIS — N186 End stage renal disease: Secondary | ICD-10-CM | POA: Diagnosis not present

## 2019-01-02 DIAGNOSIS — Z992 Dependence on renal dialysis: Secondary | ICD-10-CM | POA: Diagnosis not present

## 2019-01-02 DIAGNOSIS — D631 Anemia in chronic kidney disease: Secondary | ICD-10-CM | POA: Diagnosis not present

## 2019-01-02 DIAGNOSIS — N2581 Secondary hyperparathyroidism of renal origin: Secondary | ICD-10-CM | POA: Diagnosis not present

## 2019-01-04 MED FILL — AURYXIA 210 MG TABLET: 1 GM | 33 days supply | Qty: 200 | Fill #0

## 2019-01-06 DIAGNOSIS — D631 Anemia in chronic kidney disease: Secondary | ICD-10-CM | POA: Diagnosis not present

## 2019-01-06 DIAGNOSIS — N186 End stage renal disease: Secondary | ICD-10-CM | POA: Diagnosis not present

## 2019-01-06 DIAGNOSIS — Z992 Dependence on renal dialysis: Secondary | ICD-10-CM | POA: Diagnosis not present

## 2019-01-06 DIAGNOSIS — N2581 Secondary hyperparathyroidism of renal origin: Secondary | ICD-10-CM | POA: Diagnosis not present

## 2019-01-06 DIAGNOSIS — D509 Iron deficiency anemia, unspecified: Secondary | ICD-10-CM | POA: Diagnosis not present

## 2019-01-09 DIAGNOSIS — Z992 Dependence on renal dialysis: Secondary | ICD-10-CM | POA: Diagnosis not present

## 2019-01-09 DIAGNOSIS — N2581 Secondary hyperparathyroidism of renal origin: Secondary | ICD-10-CM | POA: Diagnosis not present

## 2019-01-09 DIAGNOSIS — N186 End stage renal disease: Secondary | ICD-10-CM | POA: Diagnosis not present

## 2019-01-09 DIAGNOSIS — D509 Iron deficiency anemia, unspecified: Secondary | ICD-10-CM | POA: Diagnosis not present

## 2019-01-09 DIAGNOSIS — D631 Anemia in chronic kidney disease: Secondary | ICD-10-CM | POA: Diagnosis not present

## 2019-01-11 DIAGNOSIS — N2581 Secondary hyperparathyroidism of renal origin: Secondary | ICD-10-CM | POA: Diagnosis not present

## 2019-01-11 DIAGNOSIS — D631 Anemia in chronic kidney disease: Secondary | ICD-10-CM | POA: Diagnosis not present

## 2019-01-11 DIAGNOSIS — N186 End stage renal disease: Secondary | ICD-10-CM | POA: Diagnosis not present

## 2019-01-11 DIAGNOSIS — D509 Iron deficiency anemia, unspecified: Secondary | ICD-10-CM | POA: Diagnosis not present

## 2019-01-11 DIAGNOSIS — Z992 Dependence on renal dialysis: Secondary | ICD-10-CM | POA: Diagnosis not present

## 2019-01-13 DIAGNOSIS — D509 Iron deficiency anemia, unspecified: Secondary | ICD-10-CM | POA: Diagnosis not present

## 2019-01-13 DIAGNOSIS — N186 End stage renal disease: Secondary | ICD-10-CM | POA: Diagnosis not present

## 2019-01-13 DIAGNOSIS — Z992 Dependence on renal dialysis: Secondary | ICD-10-CM | POA: Diagnosis not present

## 2019-01-13 DIAGNOSIS — D631 Anemia in chronic kidney disease: Secondary | ICD-10-CM | POA: Diagnosis not present

## 2019-01-13 DIAGNOSIS — N2581 Secondary hyperparathyroidism of renal origin: Secondary | ICD-10-CM | POA: Diagnosis not present

## 2019-01-16 DIAGNOSIS — D509 Iron deficiency anemia, unspecified: Secondary | ICD-10-CM | POA: Diagnosis not present

## 2019-01-16 DIAGNOSIS — Z992 Dependence on renal dialysis: Secondary | ICD-10-CM | POA: Diagnosis not present

## 2019-01-16 DIAGNOSIS — N2581 Secondary hyperparathyroidism of renal origin: Secondary | ICD-10-CM | POA: Diagnosis not present

## 2019-01-16 DIAGNOSIS — D631 Anemia in chronic kidney disease: Secondary | ICD-10-CM | POA: Diagnosis not present

## 2019-01-16 DIAGNOSIS — N186 End stage renal disease: Secondary | ICD-10-CM | POA: Diagnosis not present

## 2019-01-18 DIAGNOSIS — Z992 Dependence on renal dialysis: Secondary | ICD-10-CM | POA: Diagnosis not present

## 2019-01-18 DIAGNOSIS — N2581 Secondary hyperparathyroidism of renal origin: Secondary | ICD-10-CM | POA: Diagnosis not present

## 2019-01-18 DIAGNOSIS — D631 Anemia in chronic kidney disease: Secondary | ICD-10-CM | POA: Diagnosis not present

## 2019-01-18 DIAGNOSIS — D509 Iron deficiency anemia, unspecified: Secondary | ICD-10-CM | POA: Diagnosis not present

## 2019-01-18 DIAGNOSIS — N186 End stage renal disease: Secondary | ICD-10-CM | POA: Diagnosis not present

## 2019-01-20 DIAGNOSIS — N2581 Secondary hyperparathyroidism of renal origin: Secondary | ICD-10-CM | POA: Diagnosis not present

## 2019-01-20 DIAGNOSIS — N186 End stage renal disease: Secondary | ICD-10-CM | POA: Diagnosis not present

## 2019-01-20 DIAGNOSIS — D631 Anemia in chronic kidney disease: Secondary | ICD-10-CM | POA: Diagnosis not present

## 2019-01-20 DIAGNOSIS — Z992 Dependence on renal dialysis: Secondary | ICD-10-CM | POA: Diagnosis not present

## 2019-01-20 DIAGNOSIS — D509 Iron deficiency anemia, unspecified: Secondary | ICD-10-CM | POA: Diagnosis not present

## 2019-01-23 DIAGNOSIS — D631 Anemia in chronic kidney disease: Secondary | ICD-10-CM | POA: Diagnosis not present

## 2019-01-23 DIAGNOSIS — D509 Iron deficiency anemia, unspecified: Secondary | ICD-10-CM | POA: Diagnosis not present

## 2019-01-23 DIAGNOSIS — N2581 Secondary hyperparathyroidism of renal origin: Secondary | ICD-10-CM | POA: Diagnosis not present

## 2019-01-23 DIAGNOSIS — N186 End stage renal disease: Secondary | ICD-10-CM | POA: Diagnosis not present

## 2019-01-23 DIAGNOSIS — Z992 Dependence on renal dialysis: Secondary | ICD-10-CM | POA: Diagnosis not present

## 2019-01-25 DIAGNOSIS — D509 Iron deficiency anemia, unspecified: Secondary | ICD-10-CM | POA: Diagnosis not present

## 2019-01-25 DIAGNOSIS — Z992 Dependence on renal dialysis: Secondary | ICD-10-CM | POA: Diagnosis not present

## 2019-01-25 DIAGNOSIS — N186 End stage renal disease: Secondary | ICD-10-CM | POA: Diagnosis not present

## 2019-01-25 DIAGNOSIS — N2581 Secondary hyperparathyroidism of renal origin: Secondary | ICD-10-CM | POA: Diagnosis not present

## 2019-01-25 DIAGNOSIS — D631 Anemia in chronic kidney disease: Secondary | ICD-10-CM | POA: Diagnosis not present

## 2019-01-27 DIAGNOSIS — D509 Iron deficiency anemia, unspecified: Secondary | ICD-10-CM | POA: Diagnosis not present

## 2019-01-27 DIAGNOSIS — D631 Anemia in chronic kidney disease: Secondary | ICD-10-CM | POA: Diagnosis not present

## 2019-01-27 DIAGNOSIS — N2581 Secondary hyperparathyroidism of renal origin: Secondary | ICD-10-CM | POA: Diagnosis not present

## 2019-01-27 DIAGNOSIS — N186 End stage renal disease: Secondary | ICD-10-CM | POA: Diagnosis not present

## 2019-01-27 DIAGNOSIS — Z992 Dependence on renal dialysis: Secondary | ICD-10-CM | POA: Diagnosis not present

## 2019-01-29 DIAGNOSIS — Z992 Dependence on renal dialysis: Secondary | ICD-10-CM | POA: Diagnosis not present

## 2019-01-29 DIAGNOSIS — N186 End stage renal disease: Secondary | ICD-10-CM | POA: Diagnosis not present

## 2019-01-29 DIAGNOSIS — I120 Hypertensive chronic kidney disease with stage 5 chronic kidney disease or end stage renal disease: Secondary | ICD-10-CM | POA: Diagnosis not present

## 2019-01-30 DIAGNOSIS — Z23 Encounter for immunization: Secondary | ICD-10-CM | POA: Diagnosis not present

## 2019-01-30 DIAGNOSIS — N186 End stage renal disease: Secondary | ICD-10-CM | POA: Diagnosis not present

## 2019-01-30 DIAGNOSIS — N2581 Secondary hyperparathyroidism of renal origin: Secondary | ICD-10-CM | POA: Diagnosis not present

## 2019-01-30 DIAGNOSIS — I120 Hypertensive chronic kidney disease with stage 5 chronic kidney disease or end stage renal disease: Secondary | ICD-10-CM | POA: Diagnosis not present

## 2019-01-30 DIAGNOSIS — Z992 Dependence on renal dialysis: Secondary | ICD-10-CM | POA: Diagnosis not present

## 2019-01-30 DIAGNOSIS — D631 Anemia in chronic kidney disease: Secondary | ICD-10-CM | POA: Diagnosis not present

## 2019-02-01 DIAGNOSIS — N2581 Secondary hyperparathyroidism of renal origin: Secondary | ICD-10-CM | POA: Diagnosis not present

## 2019-02-01 DIAGNOSIS — Z23 Encounter for immunization: Secondary | ICD-10-CM | POA: Diagnosis not present

## 2019-02-01 DIAGNOSIS — D631 Anemia in chronic kidney disease: Secondary | ICD-10-CM | POA: Diagnosis not present

## 2019-02-01 DIAGNOSIS — N186 End stage renal disease: Secondary | ICD-10-CM | POA: Diagnosis not present

## 2019-02-01 DIAGNOSIS — Z992 Dependence on renal dialysis: Secondary | ICD-10-CM | POA: Diagnosis not present

## 2019-02-02 DIAGNOSIS — L309 Dermatitis, unspecified: Secondary | ICD-10-CM | POA: Diagnosis not present

## 2019-02-02 DIAGNOSIS — R21 Rash and other nonspecific skin eruption: Secondary | ICD-10-CM | POA: Diagnosis not present

## 2019-02-03 DIAGNOSIS — N186 End stage renal disease: Secondary | ICD-10-CM | POA: Diagnosis not present

## 2019-02-03 DIAGNOSIS — Z23 Encounter for immunization: Secondary | ICD-10-CM | POA: Diagnosis not present

## 2019-02-03 DIAGNOSIS — Z992 Dependence on renal dialysis: Secondary | ICD-10-CM | POA: Diagnosis not present

## 2019-02-03 DIAGNOSIS — D631 Anemia in chronic kidney disease: Secondary | ICD-10-CM | POA: Diagnosis not present

## 2019-02-03 DIAGNOSIS — N2581 Secondary hyperparathyroidism of renal origin: Secondary | ICD-10-CM | POA: Diagnosis not present

## 2019-02-06 DIAGNOSIS — D631 Anemia in chronic kidney disease: Secondary | ICD-10-CM | POA: Diagnosis not present

## 2019-02-06 DIAGNOSIS — N2581 Secondary hyperparathyroidism of renal origin: Secondary | ICD-10-CM | POA: Diagnosis not present

## 2019-02-06 DIAGNOSIS — N186 End stage renal disease: Secondary | ICD-10-CM | POA: Diagnosis not present

## 2019-02-06 DIAGNOSIS — Z23 Encounter for immunization: Secondary | ICD-10-CM | POA: Diagnosis not present

## 2019-02-06 DIAGNOSIS — Z992 Dependence on renal dialysis: Secondary | ICD-10-CM | POA: Diagnosis not present

## 2019-02-07 ENCOUNTER — Ambulatory Visit (INDEPENDENT_AMBULATORY_CARE_PROVIDER_SITE_OTHER)
Admission: EM | Admit: 2019-02-07 | Discharge: 2019-02-07 | Disposition: A | Discharge status: Home / Self Care | Payer: Medicare Other | Source: Home / Self Care

## 2019-02-07 ENCOUNTER — Encounter (HOSPITAL_COMMUNITY): Payer: Self-pay | Admitting: Emergency Medicine

## 2019-02-07 ENCOUNTER — Emergency Department (HOSPITAL_COMMUNITY)
Admission: EM | Admit: 2019-02-07 | Discharge: 2019-02-07 | Disposition: A | Discharge status: Home / Self Care | Payer: Medicare Other | Attending: Emergency Medicine | Admitting: Emergency Medicine

## 2019-02-07 ENCOUNTER — Telehealth: Payer: Self-pay

## 2019-02-07 ENCOUNTER — Ambulatory Visit (HOSPITAL_BASED_OUTPATIENT_CLINIC_OR_DEPARTMENT_OTHER)
Admission: RE | Admit: 2019-02-07 | Discharge: 2019-02-07 | Disposition: A | Discharge status: Home / Self Care | Payer: Medicare Other | Source: Ambulatory Visit

## 2019-02-07 ENCOUNTER — Other Ambulatory Visit: Payer: Self-pay

## 2019-02-07 DIAGNOSIS — I5032 Chronic diastolic (congestive) heart failure: Secondary | ICD-10-CM | POA: Diagnosis not present

## 2019-02-07 DIAGNOSIS — M7989 Other specified soft tissue disorders: Secondary | ICD-10-CM | POA: Diagnosis not present

## 2019-02-07 DIAGNOSIS — I132 Hypertensive heart and chronic kidney disease with heart failure and with stage 5 chronic kidney disease, or end stage renal disease: Secondary | ICD-10-CM | POA: Diagnosis not present

## 2019-02-07 DIAGNOSIS — M79652 Pain in left thigh: Secondary | ICD-10-CM | POA: Insufficient documentation

## 2019-02-07 DIAGNOSIS — M79605 Pain in left leg: Secondary | ICD-10-CM

## 2019-02-07 DIAGNOSIS — N186 End stage renal disease: Secondary | ICD-10-CM | POA: Insufficient documentation

## 2019-02-07 DIAGNOSIS — Z79899 Other long term (current) drug therapy: Secondary | ICD-10-CM | POA: Insufficient documentation

## 2019-02-07 DIAGNOSIS — Z992 Dependence on renal dialysis: Secondary | ICD-10-CM | POA: Diagnosis not present

## 2019-02-07 DIAGNOSIS — M79609 Pain in unspecified limb: Secondary | ICD-10-CM | POA: Diagnosis not present

## 2019-02-07 DIAGNOSIS — F17210 Nicotine dependence, cigarettes, uncomplicated: Secondary | ICD-10-CM | POA: Diagnosis not present

## 2019-02-07 NOTE — ED Notes (Signed)
DVT study scheduled for 1430

## 2019-02-07 NOTE — ED Triage Notes (Signed)
Pt here for left leg pain x 2 weeks in upper part of leg; pt denies injury

## 2019-02-07 NOTE — Discharge Instructions (Addendum)
Go to vascular for Ultrasound  at 2:30 be there 30 minutes early.  Go to the main entrance for check in

## 2019-02-07 NOTE — ED Triage Notes (Signed)
Pt states she has been having intermittent left upper leg pain. Seen by vascular today who referred pt to ED after evaluation. Pt states vascular reported no findings with scan. Bilateral feet warm to touch, unable to obtain pedal pulses at time of triage. Pt states she is currently not having any pain.

## 2019-02-07 NOTE — Telephone Encounter (Signed)
Patient walked into our office with complaint of left upper and lower leg pain.   She does not see our provides for on going care and does not have a primary care provider.   She was referred to urgent care for evaluation.  Patient was using a walker and her gait was disturbed.  She appeared very frail.   Laverle Patter, RN

## 2019-02-07 NOTE — ED Provider Notes (Addendum)
[facility] EMERGENCY DEPARTMENT Provider Note   CSN: [id number] Arrival date & time: 02/07/19  1514     History   Chief Complaint Chief Complaint  Patient presents with   Leg Pain    HPI Karen Morrow is a 58 y.o. female.     HPI    58 year old female presents today with complaints of left-sided leg pain.  She notes a several day history of left-sided thigh pain.  She notes taking Tylenol today which completely resolved her symptoms.  She denies any swelling or edema to the lower extremity, no color changes.  She had an ultrasound today that showed no blood clot.  She denies any trauma.  No abdominal or back pain.   Past Medical History:  Diagnosis Date   Anginal pain ([place])    Asthma    CHF (congestive heart failure) (HCC)    Chronic kidney disease    dialysis 3x wk   Dyspnea    Frequent bowel movements    GERD (gastroesophageal reflux disease)    Hepatitis C antibody test positive    Hypertension    "just dx'd today" (11/03/2012)    Patient Active Problem List   Diagnosis Date Noted   Fluid overload 11/27/2018   Acute on chronic diastolic CHF (congestive heart failure) ([place]) 11/27/2018   Acute respiratory failure with hypoxia ([place]) 11/27/2018   Leukocytosis 11/27/2018   Volume overload 11/27/2018   Hiatal hernia    Gastrointestinal bleeding 09/29/2018   GI bleed 09/29/2018   Chronic hepatitis C ([place]) 09/23/2018   Symptomatic anemia 09/16/2018   Severe systemic inflammatory response syndrome (SIRS) ([place]) 09/16/2018   Altered mental status 09/16/2018   Tobacco abuse 09/16/2018   Chronic diastolic (congestive) heart failure ([place]) 07/07/2018   Aortic insufficiency 07/07/2018   Hypertensive urgency 05/13/2018   Infection of arteriovenous fistula ([place]) 04/25/2018   MSSA bacteremia 04/25/2018   Bacteremia    Staphylococcal sepsis ([place])    Sepsis ([place]) 03/10/2018   Hyperkalemia    Acute respiratory distress     Pulmonary hypertension due to left ventricular diastolic dysfunction ([place]) 11/08/2016   Chronic cough 10/15/2016   Acute GI bleeding 09/07/2016   Gastroesophageal reflux disease 04/16/2016   Anemia in chronic kidney disease, on chronic dialysis ([place]) 04/16/2016   Primary insomnia 04/16/2016   Shortness of breath 04/16/2016   Pruritus 12/11/2014   Hordeolum externum (stye) 12/11/2014   Xanthelasma of right upper eyelid 12/11/2014   Dermatitis 10/30/2014   ESRD on dialysis ([place]) 02/25/2014   Malnutrition of moderate degree ([place]) 11/28/2013   HTN (hypertension) 11/26/2013   Gastric ulcer 06/07/2013   Abdominal pain 11/03/2012   Chest pain 11/03/2012   Thrombocytopenia ([place]) 11/03/2012   Elevated transaminase level 11/03/2012   Hepatic cirrhosis ([place]) 11/03/2012   Hepatitis C antibody test positive     Past Surgical History:  Procedure Laterality Date   ABDOMINAL HYSTERECTOMY     "partial" (11/03/2012)   AV FISTULA PLACEMENT Right 12/03/2013   Procedure: CREATION OF ARTERIOVENOUS (AV) FISTULA RIGHT ARM ;  Surgeon: Rosetta Posner, MD;  Location: [facility];  Service: Vascular;  Laterality: Right;   AV FISTULA PLACEMENT Left 05/17/2018   Procedure: CREATION OF BRACHIOCEPHALIC FISTULA LEFT ARM;  Surgeon: Waynetta Sandy, MD;  Location: [facility];  Service: Vascular;  Laterality: Left;   [place] REMOVAL Right 03/16/2018   Procedure: REMOVAL OF ARTERIOVENOUS GORETEX GRAFT ([place]) RIGHT ARM;  Surgeon: Waynetta Sandy, MD;  Location: [facility];  Service:  Vascular;  Laterality: Right;   BASCILIC VEIN TRANSPOSITION Right 03/06/2014   Procedure: SECOND STAGE BASILIC VEIN TRANSPOSITION;  Surgeon: Rosetta Posner, MD;  Location: [facility];  Service: Vascular;  Laterality: Right;   BIOPSY  09/30/2018   Procedure: BIOPSY;  Surgeon: Mauri Pole, MD;  Location: [facility];  Service: Endoscopy;;   ENTEROSCOPY N/A 10/06/2018   Procedure: ENTEROSCOPY;  Surgeon: Lavena Bullion, DO;  Location: [facility];  Service: Gastroenterology;  Laterality: N/A;   ESOPHAGOGASTRODUODENOSCOPY N/A 06/05/2013   Procedure: ESOPHAGOGASTRODUODENOSCOPY (EGD);  Surgeon: Winfield Cunas., MD;  Location: [facility];  Service: Endoscopy;  Laterality: N/A;   ESOPHAGOGASTRODUODENOSCOPY N/A 09/08/2016   Procedure: ESOPHAGOGASTRODUODENOSCOPY (EGD);  Surgeon: Milus Banister, MD;  Location: [facility];  Service: Endoscopy;  Laterality: N/A;   ESOPHAGOGASTRODUODENOSCOPY (EGD) WITH PROPOFOL N/A 09/17/2018   Procedure: ESOPHAGOGASTRODUODENOSCOPY (EGD) WITH PROPOFOL;  Surgeon: Carol Ada, MD;  Location: [facility];  Service: Endoscopy;  Laterality: N/A;   ESOPHAGOGASTRODUODENOSCOPY (EGD) WITH PROPOFOL N/A 09/30/2018   Procedure: ESOPHAGOGASTRODUODENOSCOPY (EGD) WITH PROPOFOL;  Surgeon: Mauri Pole, MD;  Location: [facility];  Service: Endoscopy;  Laterality: N/A;   INSERTION OF DIALYSIS CATHETER Left 03/16/2018   Procedure: PLACEMENT OF TUNNEL DIALYSIS CATHETER;  Surgeon: Waynetta Sandy, MD;  Location: [facility];  Service: Vascular;  Laterality: Left;   INSERTION OF DIALYSIS CATHETER Right 09/26/2018   Procedure: INSERTION OF DIALYSIS  TUNNELED CATHETER;  Surgeon: Elam Dutch, MD;  Location: [facility];  Service: Vascular;  Laterality: Right;   IR REMOVAL TUN CV CATH W/O FL  09/17/2018   REVISON OF ARTERIOVENOUS FISTULA Right 01/28/2017   Procedure: REVISON OF RIGHT ARM ARTERIOVENOUS FISTULA USING 8MMX10CM GORETEX GRAFT;  Surgeon: Angelia Mould, MD;  Location: [facility];  Service: Vascular;  Laterality: Right;     OB History   No obstetric history on file.      Home Medications    Prior to Admission medications   Medication Sig Start Date End Date Taking? Authorizing Provider  acetaminophen (TYLENOL) 500 MG tablet Take 500-1,000 mg by mouth every 6 (six) hours as needed for headache (pain).    [provider]  albuterol (PROVENTIL  HFA;VENTOLIN HFA) 108 (90 Base) MCG/ACT inhaler TAKE 2 PUFFS BY MOUTH EVERY 6 HOURS AS NEEDED FOR WHEEZE OR SHORTNESS OF BREATH Patient not taking: Reported on 10/18/2018 05/09/18   [name], MD  Amino Acids-Protein Hydrolys (FEEDING SUPPLEMENT, PRO-STAT SUGAR FREE 64,) LIQD Take 30 mLs by mouth 2 (two) times daily. 09/19/18   Hosie Poisson, MD  amLODipine (NORVASC) 10 MG tablet Take 1 tablet (10 mg total) by mouth daily. Patient taking differently: Take 10 mg by mouth at bedtime.  05/16/18   Thurnell Lose, MD  b complex-vitamin c-folic acid (NEPHRO-VITE) 0.8 MG TABS tablet Take 1 tablet by mouth every evening. 07/27/18   [provider]  calcium acetate (PHOSLO) 667 MG capsule Take 1,334 mg by mouth 3 (three) times daily with meals.    [provider]  cinacalcet (SENSIPAR) 30 MG tablet TAKE 1 TABLET BY MOUTH DAILY EVERY EVENING (DO NOT TAKE LESS THAN 12 HOURS PRIOR TO DIALYSIS) Patient taking differently: Take 30 mg by mouth daily with supper. (DO NOT TAKE LESS THAN 12 HOURS PRIOR TO DIALYSIS) 09/19/18   Hosie Poisson, MD  ferric citrate (AURYXIA) 1 GM 210 MG(Fe) tablet Take 420 mg by mouth 3 (three) times daily with meals.    [provider]  metoprolol tartrate (  LOPRESSOR) 25 MG tablet Take 25 mg by mouth 2 (two) times daily.    [provider]  oxyCODONE-acetaminophen (PERCOCET/ROXICET) 5-325 MG tablet Take 1 tablet by mouth every 6 (six) hours as needed for severe pain. Patient not taking: Reported on 10/09/2018 09/26/18   Elam Dutch, MD  pantoprazole (PROTONIX) 40 MG tablet Take 1 tablet (40 mg total) by mouth 2 (two) times daily. 10/01/18   Mercy Riding, MD    Family History Family History  Problem Relation Age of Onset   Lupus Mother    Diabetes Father    Heart attack Father     Social History Social History   Tobacco Use   Smoking status: Light Tobacco Smoker    Packs/day: 0.50    Years: 20.00    Pack years: 10.00     Types: Cigarettes   Smokeless tobacco: Never Used  Substance Use Topics   Alcohol use: Not Currently    Alcohol/week: 1.0 standard drinks    Types: 1 Glasses of wine per week   Drug use: Not Currently    Types: Marijuana     Allergies   Cefazolin   Review of Systems Review of Systems  All other systems reviewed and are negative.   Physical Exam Updated Vital Signs There were no vitals taken for this visit.  Physical Exam Vitals signs and nursing note reviewed.  Constitutional:      Appearance: She is well-developed.  HENT:     Head: Normocephalic and atraumatic.  Eyes:     General: No scleral icterus.       Right eye: No discharge.        Left eye: No discharge.     Conjunctiva/sclera: Conjunctivae normal.     Pupils: Pupils are equal, round, and reactive to light.  Neck:     Musculoskeletal: Normal range of motion.     Vascular: No JVD.     Trachea: No tracheal deviation.  Pulmonary:     Effort: Pulmonary effort is normal.     Breath sounds: No stridor.  Musculoskeletal:     Comments: Bilateral lower extremities without edema, no rashes or bruising to the lower extremities, nontender to palpation from the hip down through the toes, pedal pulse 2+ cap refill intact full active range of motion at the knee and hip without pain, no lumbar tenderness to palpation  Neurological:     Mental Status: She is alert and oriented to person, place, and time.     Coordination: Coordination normal.  Psychiatric:        Behavior: Behavior normal.        Thought Content: Thought content normal.        Judgment: Judgment normal.      ED Treatments / Results  Labs (all labs ordered are listed, but only abnormal results are displayed) Labs Reviewed - No data to display  EKG None  Radiology Le Venous  Result Date: 02/07/2019  Lower Venous Study Indications: Pain.  Comparison Study: no prior Performing Technologist: Abram Sander RVS  Examination Guidelines: A complete  evaluation includes B-mode imaging, spectral Doppler, color Doppler, and power Doppler as needed of all accessible portions of each vessel. Bilateral testing is considered an integral part of a complete examination. Limited examinations for reoccurring indications may be performed as noted.  +-----+---------------+---------+-----------+----------+--------------+  RIGHT Compressibility Phasicity Spontaneity Properties Thrombus Aging  +-----+---------------+---------+-----------+----------+--------------+  CFV   Full            Yes  Yes                                    +-----+---------------+---------+-----------+----------+--------------+   +---------+---------------+---------+-----------+----------+--------------+  LEFT      Compressibility Phasicity Spontaneity Properties Thrombus Aging  +---------+---------------+---------+-----------+----------+--------------+  CFV       Full            Yes       Yes                                    +---------+---------------+---------+-----------+----------+--------------+  SFJ       Full                                                             +---------+---------------+---------+-----------+----------+--------------+  FV Prox   Full                                                             +---------+---------------+---------+-----------+----------+--------------+  FV Mid    Full                                                             +---------+---------------+---------+-----------+----------+--------------+  FV Distal Full                                                             +---------+---------------+---------+-----------+----------+--------------+  PFV       Full                                                             +---------+---------------+---------+-----------+----------+--------------+  POP       Full            Yes       Yes                                    +---------+---------------+---------+-----------+----------+--------------+   PTV       Full                                                             +---------+---------------+---------+-----------+----------+--------------+  PERO  Not visualized  +---------+---------------+---------+-----------+----------+--------------+     Summary: Right: No evidence of common femoral vein obstruction. Left: There is no evidence of deep vein thrombosis in the lower extremity. No cystic structure found in the popliteal fossa.  *See table(s) above for measurements and observations. Electronically signed by Curt Jews MD on 02/07/2019 at 4:46:35 PM.    Final     Procedures Procedures (including critical care time)  Medications Ordered in ED Medications - No data to display   Initial Impression / Assessment and Plan / ED Course  I have reviewed the triage vital signs and the nursing notes.  Pertinent labs & imaging results that were available during my care of the patient were reviewed by me and considered in my medical decision making (see chart for details).        58 year old female presents today with left leg pain.  No signs of infectious etiology no blood clots no vascular etiology.  Likely muscular in nature discharged with symptomatic care and follow-up information.  She verbalized understanding and agreement to today's plan had no further questions or concerns.  Final Clinical Impressions(s) / ED Diagnoses   Final diagnoses:  Left leg pain    ED Discharge Orders    None       Francee Gentile 02/07/19 1839    Okey Regal, PA-C 02/07/19 2055    Drenda Freeze, MD 02/07/19 2325

## 2019-02-07 NOTE — ED Notes (Signed)
The pt saw her doctor earlier today for some lt leg pain  She reports that she took 2 tylenol and she mo longer has pain  Someone checked her for a blood clot and it was negative.  She alos reports she has no intention of STAYING ALL NIGHT'

## 2019-02-07 NOTE — Discharge Instructions (Addendum)
Please read attached information. If you experience any new or worsening signs or symptoms please return to the emergency room for evaluation. Please follow-up with your primary care provider or specialist as discussed.  °

## 2019-02-07 NOTE — Progress Notes (Signed)
Lower extremity venous has been completed.   Preliminary results in CV Proc.   Karen Morrow 02/07/2019 3:00 PM

## 2019-02-07 NOTE — ED Notes (Signed)
Pt updated that room is being cleaned.

## 2019-02-07 NOTE — ED Notes (Signed)
PT THREATENING TO LEAVE JEFF WAS LOOKING FOR HER BACK TO THE ROOM SO HE COULD CHECK HER

## 2019-02-07 NOTE — ED Provider Notes (Addendum)
[facility]    CSN: [id number] Arrival date & time: 02/07/19  1019      History   Chief Complaint Chief Complaint  Patient presents with   Leg Pain    HPI Karen Morrow is a 58 y.o. female.   Patient is a 58 year old female past medical history of asthma, CHF, chronic kidney disease on dialysis, dyspnea, GERD, hep C, hypertension, GI bleeding, anemia, SIRS, sepsis, hyperkalemia, cirrhosis.  She presents today with approximately 2 days of worsening left leg pain.  There has been some swelling and increased warmth.  Pain is more severe with walking.  It is also painful to touch.  Denies any history of PAD, DVT or PEs.  No chest pain or shortness of breath.  No injury to the leg.  Patient does have sores all over her legs.  These are chronic for her.  Patient last dialysis on Tuesday.  She has not take anything for the pain.  No numbness, tingling.  No fever.  Patient is only tachycardic here today.  History of tachycardia.  No recent traveling  long distance.  ROS per HPI       Past Medical History:  Diagnosis Date   Anginal pain ([place])    Asthma    CHF (congestive heart failure) (HCC)    Chronic kidney disease    dialysis 3x wk   Dyspnea    Frequent bowel movements    GERD (gastroesophageal reflux disease)    Hepatitis C antibody test positive    Hypertension    "just dx'd today" (11/03/2012)    Patient Active Problem List   Diagnosis Date Noted   Fluid overload 11/27/2018   Acute on chronic diastolic CHF (congestive heart failure) ([place]) 11/27/2018   Acute respiratory failure with hypoxia ([place]) 11/27/2018   Leukocytosis 11/27/2018   Volume overload 11/27/2018   Hiatal hernia    Gastrointestinal bleeding 09/29/2018   GI bleed 09/29/2018   Chronic hepatitis C ([place]) 09/23/2018   Symptomatic anemia 09/16/2018   Severe systemic inflammatory response syndrome (SIRS) ([place]) 09/16/2018   Altered mental status 09/16/2018   Tobacco abuse  09/16/2018   Chronic diastolic (congestive) heart failure ([place]) 07/07/2018   Aortic insufficiency 07/07/2018   Hypertensive urgency 05/13/2018   Infection of arteriovenous fistula ([place]) 04/25/2018   MSSA bacteremia 04/25/2018   Bacteremia    Staphylococcal sepsis ([place])    Sepsis ([place]) 03/10/2018   Hyperkalemia    Acute respiratory distress    Pulmonary hypertension due to left ventricular diastolic dysfunction ([place]) 11/08/2016   Chronic cough 10/15/2016   Acute GI bleeding 09/07/2016   Gastroesophageal reflux disease 04/16/2016   Anemia in chronic kidney disease, on chronic dialysis ([place]) 04/16/2016   Primary insomnia 04/16/2016   Shortness of breath 04/16/2016   Pruritus 12/11/2014   Hordeolum externum (stye) 12/11/2014   Xanthelasma of right upper eyelid 12/11/2014   Dermatitis 10/30/2014   ESRD on dialysis ([place]) 02/25/2014   Malnutrition of moderate degree ([place]) 11/28/2013   HTN (hypertension) 11/26/2013   Gastric ulcer 06/07/2013   Abdominal pain 11/03/2012   Chest pain 11/03/2012   Thrombocytopenia ([place]) 11/03/2012   Elevated transaminase level 11/03/2012   Hepatic cirrhosis ([place]) 11/03/2012   Hepatitis C antibody test positive     Past Surgical History:  Procedure Laterality Date   ABDOMINAL HYSTERECTOMY     "partial" (11/03/2012)   AV FISTULA PLACEMENT Right 12/03/2013   Procedure: CREATION OF ARTERIOVENOUS (AV) FISTULA RIGHT ARM ;  Surgeon: Arvilla Meres  Early, MD;  Location: [facility];  Service: Vascular;  Laterality: Right;   AV FISTULA PLACEMENT Left 05/17/2018   Procedure: CREATION OF BRACHIOCEPHALIC FISTULA LEFT ARM;  Surgeon: Waynetta Sandy, MD;  Location: [facility];  Service: Vascular;  Laterality: Left;   [place] REMOVAL Right 03/16/2018   Procedure: REMOVAL OF ARTERIOVENOUS GORETEX GRAFT ([place]) RIGHT ARM;  Surgeon: Waynetta Sandy, MD;  Location: [facility];  Service: Vascular;  Laterality: Right;   [place]  Right 03/06/2014   Procedure: SECOND STAGE BASILIC VEIN TRANSPOSITION;  Surgeon: Rosetta Posner, MD;  Location: [facility];  Service: Vascular;  Laterality: Right;   BIOPSY  09/30/2018   Procedure: BIOPSY;  Surgeon: Mauri Pole, MD;  Location: [facility];  Service: Endoscopy;;   ENTEROSCOPY N/A 10/06/2018   Procedure: ENTEROSCOPY;  Surgeon: Lavena Bullion, DO;  Location: [facility];  Service: Gastroenterology;  Laterality: N/A;   ESOPHAGOGASTRODUODENOSCOPY N/A 06/05/2013   Procedure: ESOPHAGOGASTRODUODENOSCOPY (EGD);  Surgeon: Winfield Cunas., MD;  Location: [facility];  Service: Endoscopy;  Laterality: N/A;   ESOPHAGOGASTRODUODENOSCOPY N/A 09/08/2016   Procedure: ESOPHAGOGASTRODUODENOSCOPY (EGD);  Surgeon: Milus Banister, MD;  Location: [facility];  Service: Endoscopy;  Laterality: N/A;   ESOPHAGOGASTRODUODENOSCOPY (EGD) WITH PROPOFOL N/A 09/17/2018   Procedure: ESOPHAGOGASTRODUODENOSCOPY (EGD) WITH PROPOFOL;  Surgeon: Carol Ada, MD;  Location: [facility];  Service: Endoscopy;  Laterality: N/A;   ESOPHAGOGASTRODUODENOSCOPY (EGD) WITH PROPOFOL N/A 09/30/2018   Procedure: ESOPHAGOGASTRODUODENOSCOPY (EGD) WITH PROPOFOL;  Surgeon: Mauri Pole, MD;  Location: [facility];  Service: Endoscopy;  Laterality: N/A;   INSERTION OF DIALYSIS CATHETER Left 03/16/2018   Procedure: PLACEMENT OF TUNNEL DIALYSIS CATHETER;  Surgeon: Waynetta Sandy, MD;  Location: [facility];  Service: Vascular;  Laterality: Left;   INSERTION OF DIALYSIS CATHETER Right 09/26/2018   Procedure: INSERTION OF DIALYSIS  TUNNELED CATHETER;  Surgeon: Elam Dutch, MD;  Location: [facility];  Service: Vascular;  Laterality: Right;   IR REMOVAL TUN CV CATH W/O FL  09/17/2018   REVISON OF ARTERIOVENOUS FISTULA Right 01/28/2017   Procedure: REVISON OF RIGHT ARM ARTERIOVENOUS FISTULA USING 8MMX10CM GORETEX GRAFT;  Surgeon: Angelia Mould, MD;  Location: [facility];  Service: Vascular;  Laterality: Right;      OB History   No obstetric history on file.      Home Medications    Prior to Admission medications   Medication Sig Start Date End Date Taking? Authorizing Provider  acetaminophen (TYLENOL) 500 MG tablet Take 500-1,000 mg by mouth every 6 (six) hours as needed for headache (pain).    [provider]  albuterol (PROVENTIL HFA;VENTOLIN HFA) 108 (90 Base) MCG/ACT inhaler TAKE 2 PUFFS BY MOUTH EVERY 6 HOURS AS NEEDED FOR WHEEZE OR SHORTNESS OF BREATH Patient not taking: Reported on 10/18/2018 05/09/18   [name], MD  Amino Acids-Protein Hydrolys (FEEDING SUPPLEMENT, PRO-STAT SUGAR FREE 64,) LIQD Take 30 mLs by mouth 2 (two) times daily. 09/19/18   Hosie Poisson, MD  amLODipine (NORVASC) 10 MG tablet Take 1 tablet (10 mg total) by mouth daily. Patient taking differently: Take 10 mg by mouth at bedtime.  05/16/18   Thurnell Lose, MD  b complex-vitamin c-folic acid (NEPHRO-VITE) 0.8 MG TABS tablet Take 1 tablet by mouth every evening. 07/27/18   [provider]  calcium acetate (PHOSLO) 667 MG capsule Take 1,334 mg by mouth 3 (three) times daily with meals.    [provider]  cinacalcet (SENSIPAR) 30 MG tablet TAKE  1 TABLET BY MOUTH DAILY EVERY EVENING (DO NOT TAKE LESS THAN 12 HOURS PRIOR TO DIALYSIS) Patient taking differently: Take 30 mg by mouth daily with supper. (DO NOT TAKE LESS THAN 12 HOURS PRIOR TO DIALYSIS) 09/19/18   Hosie Poisson, MD  ferric citrate (AURYXIA) 1 GM 210 MG(Fe) tablet Take 420 mg by mouth 3 (three) times daily with meals.    [provider]  metoprolol tartrate (LOPRESSOR) 25 MG tablet Take 25 mg by mouth 2 (two) times daily.    [provider]  oxyCODONE-acetaminophen (PERCOCET/ROXICET) 5-325 MG tablet Take 1 tablet by mouth every 6 (six) hours as needed for severe pain. Patient not taking: Reported on 10/09/2018 09/26/18   Elam Dutch, MD  pantoprazole (PROTONIX) 40 MG tablet Take 1 tablet (40 mg total) by  mouth 2 (two) times daily. 10/01/18   Mercy Riding, MD    Family History Family History  Problem Relation Age of Onset   Lupus Mother    Diabetes Father    Heart attack Father     Social History Social History   Tobacco Use   Smoking status: Light Tobacco Smoker    Packs/day: 0.50    Years: 20.00    Pack years: 10.00    Types: Cigarettes   Smokeless tobacco: Never Used  Substance Use Topics   Alcohol use: Not Currently    Alcohol/week: 1.0 standard drinks    Types: 1 Glasses of wine per week   Drug use: Not Currently    Types: Marijuana     Allergies   Cefazolin   Review of Systems Review of Systems   Physical Exam Triage Vital Signs ED Triage Vitals  Enc Vitals Group     BP 02/07/19 1046 (!) 127/104     Pulse Rate 02/07/19 1046 (!) 120     Resp 02/07/19 1046 20     Temp 02/07/19 1046 98.1 F (36.7 C)     Temp Source 02/07/19 1046 Temporal     SpO2 02/07/19 1046 96 %     Weight --      Height --      Head Circumference --      Peak Flow --      Pain Score 02/07/19 1054 7     Pain Loc --      Pain Edu? --      Excl. in [place]? --    No data found.  Updated Vital Signs BP (!) 127/104 (BP Location: Right Arm)    Pulse (!) 120    Temp 98.1 F (36.7 C) (Temporal)    Resp 20    SpO2 96%   Visual Acuity Right Eye Distance:   Left Eye Distance:   Bilateral Distance:    Right Eye Near:   Left Eye Near:    Bilateral Near:     Physical Exam Vitals signs and nursing note reviewed.  Constitutional:      Comments: Emaciated   HENT:     Head: Normocephalic and atraumatic.     Nose: Nose normal.  Neck:     Musculoskeletal: Normal range of motion.  Cardiovascular:     Rate and Rhythm: Tachycardia present.  Pulmonary:     Effort: Pulmonary effort is normal.     Breath sounds: Normal breath sounds.  Musculoskeletal:        General: Swelling and tenderness present.     Left lower leg: Edema present.     Comments: Swelling and tenderness to left  lower extremity startng at ankle and  extending into left upper thigh area.  There is generalized erythema and increased warmth. Unable to palpate pulses or Doppler pedal or popliteal pulses. Generalized tenderness to entire leg  Normal temperature, color and size on the right without swelling.  2+ pedal pulse  Toes cold on both feet.   Psychiatric:        Mood and Affect: Mood normal.      UC Treatments / Results  Labs (all labs ordered are listed, but only abnormal results are displayed) Labs Reviewed - No data to display  EKG   Radiology Le Venous  Result Date: 02/07/2019  Lower Venous Study Indications: Pain.  Comparison Study: no prior Performing Technologist: Abram Sander RVS  Examination Guidelines: A complete evaluation includes B-mode imaging, spectral Doppler, color Doppler, and power Doppler as needed of all accessible portions of each vessel. Bilateral testing is considered an integral part of a complete examination. Limited examinations for reoccurring indications may be performed as noted.  +-----+---------------+---------+-----------+----------+--------------+  RIGHT Compressibility Phasicity Spontaneity Properties Thrombus Aging  +-----+---------------+---------+-----------+----------+--------------+  CFV   Full            Yes       Yes                                    +-----+---------------+---------+-----------+----------+--------------+   +---------+---------------+---------+-----------+----------+--------------+  LEFT      Compressibility Phasicity Spontaneity Properties Thrombus Aging  +---------+---------------+---------+-----------+----------+--------------+  CFV       Full            Yes       Yes                                    +---------+---------------+---------+-----------+----------+--------------+  SFJ       Full                                                             +---------+---------------+---------+-----------+----------+--------------+  FV Prox    Full                                                             +---------+---------------+---------+-----------+----------+--------------+  FV Mid    Full                                                             +---------+---------------+---------+-----------+----------+--------------+  FV Distal Full                                                             +---------+---------------+---------+-----------+----------+--------------+  PFV       Full                                                             +---------+---------------+---------+-----------+----------+--------------+  POP       Full            Yes       Yes                                    +---------+---------------+---------+-----------+----------+--------------+  PTV       Full                                                             +---------+---------------+---------+-----------+----------+--------------+  PERO                                                       Not visualized  +---------+---------------+---------+-----------+----------+--------------+     Summary: Right: No evidence of common femoral vein obstruction. Left: There is no evidence of deep vein thrombosis in the lower extremity. No cystic structure found in the popliteal fossa.  *See table(s) above for measurements and observations.    Preliminary     Procedures Procedures (including critical care time)  Medications Ordered in UC Medications - No data to display  Initial Impression / Assessment and Plan / UC Course  I have reviewed the triage vital signs and the nursing notes.  Pertinent labs & imaging results that were available during my care of the patient were reviewed by me and considered in my medical decision making (see chart for details).      Concern for DVT, PAD based on leg swelling, erythema, increased warmth and non palpable or doppler pulse in the left foot.  There is an extremely long wait in the ER Sending patient for outpatient [place]  and will plan accordingly depending on results.   Ultrasound negative for DVT Highly concerned for PAD vs cellulitis.  If this is the case she may need IV antibiotics based on the extension of the infection. If PAD  She may need ABIs, arterial duplex and vascular consult.  Sending to the ER for further evaluation and possible admission.   Final Clinical Impressions(s) / UC Diagnoses   Final diagnoses:  Pain of left lower extremity     Discharge Instructions     Go to vascular for Ultrasound  at 2:30 be there 30 minutes early.  Go to the main entrance for check in     ED Prescriptions    None     Controlled Substance Prescriptions [place] Controlled Substance Registry consulted? Not Applicable        Orvan July, NP 02/07/19 1519

## 2019-02-08 ENCOUNTER — Telehealth (HOSPITAL_COMMUNITY): Payer: Self-pay | Admitting: Emergency Medicine

## 2019-02-08 NOTE — Telephone Encounter (Signed)
Called pt to see how she was feeling, pt states she is okay, resting the leg. Will follow up if symptoms persist.

## 2019-02-10 DIAGNOSIS — D631 Anemia in chronic kidney disease: Secondary | ICD-10-CM | POA: Diagnosis not present

## 2019-02-10 DIAGNOSIS — Z23 Encounter for immunization: Secondary | ICD-10-CM | POA: Diagnosis not present

## 2019-02-10 DIAGNOSIS — Z992 Dependence on renal dialysis: Secondary | ICD-10-CM | POA: Diagnosis not present

## 2019-02-10 DIAGNOSIS — N186 End stage renal disease: Secondary | ICD-10-CM | POA: Diagnosis not present

## 2019-02-10 DIAGNOSIS — N2581 Secondary hyperparathyroidism of renal origin: Secondary | ICD-10-CM | POA: Diagnosis not present

## 2019-02-13 DIAGNOSIS — N186 End stage renal disease: Secondary | ICD-10-CM | POA: Diagnosis not present

## 2019-02-13 DIAGNOSIS — D631 Anemia in chronic kidney disease: Secondary | ICD-10-CM | POA: Diagnosis not present

## 2019-02-13 DIAGNOSIS — Z23 Encounter for immunization: Secondary | ICD-10-CM | POA: Diagnosis not present

## 2019-02-13 DIAGNOSIS — Z992 Dependence on renal dialysis: Secondary | ICD-10-CM | POA: Diagnosis not present

## 2019-02-13 DIAGNOSIS — N2581 Secondary hyperparathyroidism of renal origin: Secondary | ICD-10-CM | POA: Diagnosis not present

## 2019-02-15 DIAGNOSIS — Z23 Encounter for immunization: Secondary | ICD-10-CM | POA: Diagnosis not present

## 2019-02-15 DIAGNOSIS — Z992 Dependence on renal dialysis: Secondary | ICD-10-CM | POA: Diagnosis not present

## 2019-02-15 DIAGNOSIS — D631 Anemia in chronic kidney disease: Secondary | ICD-10-CM | POA: Diagnosis not present

## 2019-02-15 DIAGNOSIS — N186 End stage renal disease: Secondary | ICD-10-CM | POA: Diagnosis not present

## 2019-02-15 DIAGNOSIS — N2581 Secondary hyperparathyroidism of renal origin: Secondary | ICD-10-CM | POA: Diagnosis not present

## 2019-02-17 DIAGNOSIS — N2581 Secondary hyperparathyroidism of renal origin: Secondary | ICD-10-CM | POA: Diagnosis not present

## 2019-02-17 DIAGNOSIS — N186 End stage renal disease: Secondary | ICD-10-CM | POA: Diagnosis not present

## 2019-02-17 DIAGNOSIS — D631 Anemia in chronic kidney disease: Secondary | ICD-10-CM | POA: Diagnosis not present

## 2019-02-17 DIAGNOSIS — Z992 Dependence on renal dialysis: Secondary | ICD-10-CM | POA: Diagnosis not present

## 2019-02-17 DIAGNOSIS — Z23 Encounter for immunization: Secondary | ICD-10-CM | POA: Diagnosis not present

## 2019-02-20 DIAGNOSIS — N186 End stage renal disease: Secondary | ICD-10-CM | POA: Diagnosis not present

## 2019-02-20 DIAGNOSIS — Z23 Encounter for immunization: Secondary | ICD-10-CM | POA: Diagnosis not present

## 2019-02-20 DIAGNOSIS — D631 Anemia in chronic kidney disease: Secondary | ICD-10-CM | POA: Diagnosis not present

## 2019-02-20 DIAGNOSIS — N2581 Secondary hyperparathyroidism of renal origin: Secondary | ICD-10-CM | POA: Diagnosis not present

## 2019-02-20 DIAGNOSIS — Z992 Dependence on renal dialysis: Secondary | ICD-10-CM | POA: Diagnosis not present

## 2019-02-22 DIAGNOSIS — N186 End stage renal disease: Secondary | ICD-10-CM | POA: Diagnosis not present

## 2019-02-22 DIAGNOSIS — Z992 Dependence on renal dialysis: Secondary | ICD-10-CM | POA: Diagnosis not present

## 2019-02-22 DIAGNOSIS — D631 Anemia in chronic kidney disease: Secondary | ICD-10-CM | POA: Diagnosis not present

## 2019-02-22 DIAGNOSIS — N2581 Secondary hyperparathyroidism of renal origin: Secondary | ICD-10-CM | POA: Diagnosis not present

## 2019-02-22 DIAGNOSIS — Z23 Encounter for immunization: Secondary | ICD-10-CM | POA: Diagnosis not present

## 2019-02-24 DIAGNOSIS — N2581 Secondary hyperparathyroidism of renal origin: Secondary | ICD-10-CM | POA: Diagnosis not present

## 2019-02-24 DIAGNOSIS — N186 End stage renal disease: Secondary | ICD-10-CM | POA: Diagnosis not present

## 2019-02-24 DIAGNOSIS — D631 Anemia in chronic kidney disease: Secondary | ICD-10-CM | POA: Diagnosis not present

## 2019-02-24 DIAGNOSIS — Z23 Encounter for immunization: Secondary | ICD-10-CM | POA: Diagnosis not present

## 2019-02-24 DIAGNOSIS — Z992 Dependence on renal dialysis: Secondary | ICD-10-CM | POA: Diagnosis not present

## 2019-02-27 DIAGNOSIS — Z23 Encounter for immunization: Secondary | ICD-10-CM | POA: Diagnosis not present

## 2019-02-27 DIAGNOSIS — Z992 Dependence on renal dialysis: Secondary | ICD-10-CM | POA: Diagnosis not present

## 2019-02-27 DIAGNOSIS — N2581 Secondary hyperparathyroidism of renal origin: Secondary | ICD-10-CM | POA: Diagnosis not present

## 2019-02-27 DIAGNOSIS — N186 End stage renal disease: Secondary | ICD-10-CM | POA: Diagnosis not present

## 2019-02-27 DIAGNOSIS — D631 Anemia in chronic kidney disease: Secondary | ICD-10-CM | POA: Diagnosis not present

## 2019-02-28 DIAGNOSIS — N186 End stage renal disease: Secondary | ICD-10-CM | POA: Diagnosis not present

## 2019-02-28 DIAGNOSIS — Z992 Dependence on renal dialysis: Secondary | ICD-10-CM | POA: Diagnosis not present

## 2019-02-28 DIAGNOSIS — I120 Hypertensive chronic kidney disease with stage 5 chronic kidney disease or end stage renal disease: Secondary | ICD-10-CM | POA: Diagnosis not present

## 2019-03-01 DIAGNOSIS — I120 Hypertensive chronic kidney disease with stage 5 chronic kidney disease or end stage renal disease: Secondary | ICD-10-CM | POA: Diagnosis not present

## 2019-03-01 DIAGNOSIS — Z992 Dependence on renal dialysis: Secondary | ICD-10-CM | POA: Diagnosis not present

## 2019-03-01 DIAGNOSIS — D509 Iron deficiency anemia, unspecified: Secondary | ICD-10-CM | POA: Diagnosis not present

## 2019-03-01 DIAGNOSIS — N186 End stage renal disease: Secondary | ICD-10-CM | POA: Diagnosis not present

## 2019-03-01 DIAGNOSIS — K7460 Unspecified cirrhosis of liver: Secondary | ICD-10-CM | POA: Diagnosis not present

## 2019-03-01 DIAGNOSIS — N2581 Secondary hyperparathyroidism of renal origin: Secondary | ICD-10-CM | POA: Diagnosis not present

## 2019-03-01 DIAGNOSIS — D631 Anemia in chronic kidney disease: Secondary | ICD-10-CM | POA: Diagnosis not present

## 2019-03-03 DIAGNOSIS — D631 Anemia in chronic kidney disease: Secondary | ICD-10-CM | POA: Diagnosis not present

## 2019-03-03 DIAGNOSIS — N2581 Secondary hyperparathyroidism of renal origin: Secondary | ICD-10-CM | POA: Diagnosis not present

## 2019-03-03 DIAGNOSIS — D509 Iron deficiency anemia, unspecified: Secondary | ICD-10-CM | POA: Diagnosis not present

## 2019-03-03 DIAGNOSIS — Z992 Dependence on renal dialysis: Secondary | ICD-10-CM | POA: Diagnosis not present

## 2019-03-03 DIAGNOSIS — N186 End stage renal disease: Secondary | ICD-10-CM | POA: Diagnosis not present

## 2019-03-03 DIAGNOSIS — K7460 Unspecified cirrhosis of liver: Secondary | ICD-10-CM | POA: Diagnosis not present

## 2019-03-06 DIAGNOSIS — D509 Iron deficiency anemia, unspecified: Secondary | ICD-10-CM | POA: Diagnosis not present

## 2019-03-06 DIAGNOSIS — N186 End stage renal disease: Secondary | ICD-10-CM | POA: Diagnosis not present

## 2019-03-06 DIAGNOSIS — K7460 Unspecified cirrhosis of liver: Secondary | ICD-10-CM | POA: Diagnosis not present

## 2019-03-06 DIAGNOSIS — Z992 Dependence on renal dialysis: Secondary | ICD-10-CM | POA: Diagnosis not present

## 2019-03-06 DIAGNOSIS — N2581 Secondary hyperparathyroidism of renal origin: Secondary | ICD-10-CM | POA: Diagnosis not present

## 2019-03-06 DIAGNOSIS — D631 Anemia in chronic kidney disease: Secondary | ICD-10-CM | POA: Diagnosis not present

## 2019-03-07 ENCOUNTER — Telehealth: Payer: Self-pay

## 2019-03-07 ENCOUNTER — Encounter: Payer: Self-pay | Admitting: Cardiology

## 2019-03-07 ENCOUNTER — Telehealth (INDEPENDENT_AMBULATORY_CARE_PROVIDER_SITE_OTHER): Payer: Medicare Other | Admitting: Cardiology

## 2019-03-07 DIAGNOSIS — I43 Cardiomyopathy in diseases classified elsewhere: Secondary | ICD-10-CM | POA: Diagnosis not present

## 2019-03-07 DIAGNOSIS — Z992 Dependence on renal dialysis: Secondary | ICD-10-CM

## 2019-03-07 DIAGNOSIS — N186 End stage renal disease: Secondary | ICD-10-CM

## 2019-03-07 DIAGNOSIS — I119 Hypertensive heart disease without heart failure: Secondary | ICD-10-CM | POA: Diagnosis not present

## 2019-03-07 NOTE — Assessment & Plan Note (Signed)
Pt on HD- she says no missed treatments (hospitalized in June after she missed HD)

## 2019-03-07 NOTE — Patient Instructions (Signed)
Medication Instructions:  Your physician recommends that you continue on your current medications as directed. Please refer to the Current Medication list given to you today. If you need a refill on your cardiac medications before your next appointment, please call your pharmacy.   Lab work: none If you have labs (blood work) drawn today and your tests are completely normal, you will receive your results only by: Marland Kitchen MyChart Message (if you have MyChart) OR . A paper copy in the mail If you have any lab test that is abnormal or we need to change your treatment, we will call you to review the results.  Testing/Procedures: none  Follow-Up: At [facility], you and your health needs are our priority.  As part of our continuing mission to provide you with exceptional heart care, we have created designated Provider Care Teams.  These Care Teams include your primary Cardiologist (physician) and Advanced Practice Providers (APPs -  Physician Assistants and Nurse Practitioners) who all work together to provide you with the care you need, when you need it. You will need a follow up appointment in 3-4 months.  Please call our office 2 months in advance to schedule this appointment.  You may see Dr Quay Burow or one of the following Advanced Practice Providers on your designated Care Team:   Kerin Ransom, PA-C Roby Lofts, [place] . Sande Rives, PA-C  Any Other Special Instructions Will Be Listed Below (If Applicable).

## 2019-03-07 NOTE — Assessment & Plan Note (Signed)
Echo Dec 2019- EF 55-60% with grade 1 DD, mild to moderate AI (tri leaflet AOV), moderate LAE

## 2019-03-07 NOTE — Telephone Encounter (Signed)
Contacted patient to discuss AVS Instructions. Gave patient's spouse per drp Dallie Dad, Luke's recommendations from today's virtual office visit. Informed patient that someone from the scheduling dept will be in contact with them to schedule their follow up appt. Patient's spouse voiced understanding and AVS mailed.

## 2019-03-07 NOTE — Telephone Encounter (Signed)

## 2019-03-07 NOTE — Progress Notes (Signed)
Virtual Visit via Telephone Note   This visit type was conducted due to national recommendations for restrictions regarding the COVID-19 Pandemic (e.g. social distancing) in an effort to limit this patient's exposure and mitigate transmission in our community.  Due to her co-morbid illnesses, this patient is at least at moderate risk for complications without adequate follow up.  This format is felt to be most appropriate for this patient at this time.  The patient did not have access to video technology/had technical difficulties with video requiring transitioning to audio format only (telephone).  All issues noted in this document were discussed and addressed.  No physical exam could be performed with this format.  Please refer to the patient's chart for her  consent to telehealth for [facility].   Date:  03/07/2019   ID:  Karen Morrow, DOB Jan 18, 1961, MRN [id number]  Patient Location: Home Provider Location: Home  PCP:  [name], MD  Cardiologist:  Dr Gwenlyn Found Electrophysiologist:  None   Evaluation Performed:  Follow-Up Visit  Chief Complaint:  none  History of Present Illness:    Karen Morrow is a 58 y.o. female with a history of hypertension and hypertensive cardiovascular disease and end-stage renal disease.  Prior echocardiograms have suggested bicuspid aortic valve with moderate aortic insufficiency though her most recent echocardiogram in December 2019 showed normal LV function with an EF of 55 to 60%, moderate to severe LVH, grade 1 diastolic dysfunction, trileaflet aortic valve, and mild to moderate AI.  From a cardiac standpoint she has been stable.  She was admitted in June with heart failure after she missed dialysis.  She tells me she has had no missed sessions recently and is doing well on her current medications.  She denies any unusual chest pain or shortness of breath.  She says her blood pressure fluctuates some but usually is down by the time she gets  off dialysis.  The patient does not have symptoms concerning for COVID-19 infection (fever, chills, cough, or new shortness of breath).    Past Medical History:  Diagnosis Date  . Anginal pain ([place])   . Asthma   . CHF (congestive heart failure) ([place])   . Chronic kidney disease    dialysis 3x wk  . Dyspnea   . Frequent bowel movements   . GERD (gastroesophageal reflux disease)   . Hepatitis C antibody test positive   . Hypertension    "just dx'd today" (11/03/2012)   Past Surgical History:  Procedure Laterality Date  . ABDOMINAL HYSTERECTOMY     "partial" (11/03/2012)  . AV FISTULA PLACEMENT Right 12/03/2013   Procedure: CREATION OF ARTERIOVENOUS (AV) FISTULA RIGHT ARM ;  Surgeon: Rosetta Posner, MD;  Location: [place];  Service: Vascular;  Laterality: Right;  . AV FISTULA PLACEMENT Left 05/17/2018   Procedure: CREATION OF BRACHIOCEPHALIC FISTULA LEFT ARM;  Surgeon: Waynetta Sandy, MD;  Location: [place];  Service: Vascular;  Laterality: Left;  . [place] REMOVAL Right 03/16/2018   Procedure: REMOVAL OF ARTERIOVENOUS GORETEX GRAFT ([place]) RIGHT ARM;  Surgeon: Waynetta Sandy, MD;  Location: [place];  Service: Vascular;  Laterality: Right;  . BASCILIC VEIN TRANSPOSITION Right 03/06/2014   Procedure: SECOND STAGE BASILIC VEIN TRANSPOSITION;  Surgeon: Rosetta Posner, MD;  Location: [place];  Service: Vascular;  Laterality: Right;  . BIOPSY  09/30/2018   Procedure: BIOPSY;  Surgeon: Mauri Pole, MD;  Location: [place] [facility];  Service: Endoscopy;;  . ENTEROSCOPY N/A 10/06/2018  Procedure: ENTEROSCOPY;  Surgeon: Lavena Bullion, DO;  Location: [place];  Service: Gastroenterology;  Laterality: N/A;  . ESOPHAGOGASTRODUODENOSCOPY N/A 06/05/2013   Procedure: ESOPHAGOGASTRODUODENOSCOPY (EGD);  Surgeon: Winfield Cunas., MD;  Location: [facility];  Service: Endoscopy;  Laterality: N/A;  . ESOPHAGOGASTRODUODENOSCOPY N/A 09/08/2016   Procedure: ESOPHAGOGASTRODUODENOSCOPY (EGD);   Surgeon: Milus Banister, MD;  Location: [place];  Service: Endoscopy;  Laterality: N/A;  . ESOPHAGOGASTRODUODENOSCOPY (EGD) WITH PROPOFOL N/A 09/17/2018   Procedure: ESOPHAGOGASTRODUODENOSCOPY (EGD) WITH PROPOFOL;  Surgeon: Carol Ada, MD;  Location: [place];  Service: Endoscopy;  Laterality: N/A;  . ESOPHAGOGASTRODUODENOSCOPY (EGD) WITH PROPOFOL N/A 09/30/2018   Procedure: ESOPHAGOGASTRODUODENOSCOPY (EGD) WITH PROPOFOL;  Surgeon: Mauri Pole, MD;  Location: [place] [facility];  Service: Endoscopy;  Laterality: N/A;  . INSERTION OF DIALYSIS CATHETER Left 03/16/2018   Procedure: PLACEMENT OF TUNNEL DIALYSIS CATHETER;  Surgeon: Waynetta Sandy, MD;  Location: [place];  Service: Vascular;  Laterality: Left;  . INSERTION OF DIALYSIS CATHETER Right 09/26/2018   Procedure: INSERTION OF DIALYSIS  TUNNELED CATHETER;  Surgeon: Elam Dutch, MD;  [facility];  Service: Vascular;  Laterality: Right;  . IR REMOVAL TUN CV CATH W/O FL  09/17/2018  . REVISON OF ARTERIOVENOUS FISTULA Right 01/28/2017   Procedure: REVISON OF RIGHT ARM ARTERIOVENOUS FISTULA USING 8MMX10CM GORETEX GRAFT;  Surgeon: Angelia Mould, MD;  Location: [facility];  Service: Vascular;  Laterality: Right;     Current Meds  Medication Sig  . acetaminophen (TYLENOL) 500 MG tablet Take 500-1,000 mg by mouth every 6 (six) hours as needed for headache (pain).  Marland Kitchen albuterol (PROVENTIL HFA;VENTOLIN HFA) 108 (90 Base) MCG/ACT inhaler TAKE 2 PUFFS BY MOUTH EVERY 6 HOURS AS NEEDED FOR WHEEZE OR SHORTNESS OF BREATH  . Amino Acids-Protein Hydrolys (FEEDING SUPPLEMENT, PRO-STAT SUGAR FREE 64,) LIQD Take 30 mLs by mouth 2 (two) times daily.  Marland Kitchen amLODipine (NORVASC) 10 MG tablet Take 1 tablet (10 mg total) by mouth daily. (Patient taking differently: Take 10 mg by mouth at bedtime. )  . b complex-vitamin c-folic acid (NEPHRO-VITE) 0.8 MG TABS tablet Take 1 tablet by mouth every evening.  . cinacalcet (SENSIPAR) 30 MG tablet  TAKE 1 TABLET BY MOUTH DAILY EVERY EVENING (DO NOT TAKE LESS THAN 12 HOURS PRIOR TO DIALYSIS) (Patient taking differently: Take 30 mg by mouth daily with supper. (DO NOT TAKE LESS THAN 12 HOURS PRIOR TO DIALYSIS))  . cloNIDine-chlorthalidone (CLORPRES) 0.1-15 MG tablet Take by mouth. Take 3 tab in the am in the pm and 2 at bedtime  . gabapentin (NEURONTIN) 100 MG capsule Take 100 mg by mouth at bedtime.  Marland Kitchen labetalol (NORMODYNE) 200 MG tablet Take 200 mg by mouth 2 (two) times daily.  . pantoprazole (PROTONIX) 40 MG tablet Take 1 tablet (40 mg total) by mouth 2 (two) times daily.     Allergies:   Cefazolin, Cephalosporins, and Tuberculin   Social History   Tobacco Use  . Smoking status: Light Tobacco Smoker    Packs/day: 0.50    Years: 20.00    Pack years: 10.00    Types: Cigarettes  . Smokeless tobacco: Never Used  Substance Use Topics  . Alcohol use: Not Currently    Alcohol/week: 1.0 standard drinks    Types: 1 Glasses of wine per week  . Drug use: Not Currently    Types: Marijuana     Family Hx: The patient's family history includes Diabetes in her father; Heart attack in her father; Lupus  in her mother.  ROS:   Please see the history of present illness.    All other systems reviewed and are negative.   Prior CV studies:   The following studies were reviewed today: Echo dec 2019  Labs/Other Tests and Data Reviewed:    EKG:  No ECG reviewed.  Recent Labs: 09/15/2018: B Natriuretic Peptide 3,309.9 10/18/2018: ALT 31; Magnesium 2.2 11/27/2018: BUN 39; BUN 40; Creatinine, Ser 9.33; Creatinine, Ser 9.22; Hemoglobin 14.3; Platelets 229; Potassium 5.1; Sodium 137   Recent Lipid Panel Lab Results  Component Value Date/Time   CHOL 120 06/17/2017 04:17 PM   TRIG 91 06/17/2017 04:17 PM   HDL 50 06/17/2017 04:17 PM   CHOLHDL 2.4 06/17/2017 04:17 PM   CHOLHDL 2.2 12/11/2014 10:04 AM   LDLCALC 52 06/17/2017 04:17 PM    Wt Readings from Last 3 Encounters:  11/27/18 118 lb  9.7 oz (53.8 kg)  10/18/18 111 lb 15.9 oz (50.8 kg)  10/07/18 111 lb 15.9 oz (50.8 kg)     Objective:    Vital Signs:  BP 94/71   Pulse 99   Ht 5\' 7"  (1.702 m)   BMI 18.58 kg/m    VITAL SIGNS:  reviewed  ASSESSMENT & PLAN:    Hypertensive cardiomyopathy, without heart failure ([place]) Echo Dec 2019- EF 55-60% with grade 1 DD, mild to moderate AI (tri leaflet AOV), moderate LAE  ESRD on dialysis ([place]) Pt on HD- she says no missed treatments (hospitalized in June after she missed HD)  COVID-19 Education: The signs and symptoms of COVID-19 were discussed with the patient and how to seek care for testing (follow up with PCP or arrange E-visit).  The importance of social distancing was discussed today.  Time:   Today, I have spent 15 minutes with the patient with telehealth technology discussing the above problems.     Medication Adjustments/Labs and Tests Ordered: Current medicines are reviewed at length with the patient today.  Concerns regarding medicines are outlined above.   Tests Ordered: No orders of the defined types were placed in this encounter.   Medication Changes: No orders of the defined types were placed in this encounter.   Follow Up:  In Person Dr Gwenlyn Found Feb 2020  Signed, Kerin Ransom, [place]  03/07/2019 11:30 AM    [place]

## 2019-03-08 DIAGNOSIS — N2581 Secondary hyperparathyroidism of renal origin: Secondary | ICD-10-CM | POA: Diagnosis not present

## 2019-03-08 DIAGNOSIS — N186 End stage renal disease: Secondary | ICD-10-CM | POA: Diagnosis not present

## 2019-03-08 DIAGNOSIS — D631 Anemia in chronic kidney disease: Secondary | ICD-10-CM | POA: Diagnosis not present

## 2019-03-08 DIAGNOSIS — Z992 Dependence on renal dialysis: Secondary | ICD-10-CM | POA: Diagnosis not present

## 2019-03-08 DIAGNOSIS — K7460 Unspecified cirrhosis of liver: Secondary | ICD-10-CM | POA: Diagnosis not present

## 2019-03-08 DIAGNOSIS — D509 Iron deficiency anemia, unspecified: Secondary | ICD-10-CM | POA: Diagnosis not present

## 2019-03-10 DIAGNOSIS — D509 Iron deficiency anemia, unspecified: Secondary | ICD-10-CM | POA: Diagnosis not present

## 2019-03-10 DIAGNOSIS — Z992 Dependence on renal dialysis: Secondary | ICD-10-CM | POA: Diagnosis not present

## 2019-03-10 DIAGNOSIS — K7460 Unspecified cirrhosis of liver: Secondary | ICD-10-CM | POA: Diagnosis not present

## 2019-03-10 DIAGNOSIS — D631 Anemia in chronic kidney disease: Secondary | ICD-10-CM | POA: Diagnosis not present

## 2019-03-10 DIAGNOSIS — N186 End stage renal disease: Secondary | ICD-10-CM | POA: Diagnosis not present

## 2019-03-10 DIAGNOSIS — N2581 Secondary hyperparathyroidism of renal origin: Secondary | ICD-10-CM | POA: Diagnosis not present

## 2019-03-13 DIAGNOSIS — D509 Iron deficiency anemia, unspecified: Secondary | ICD-10-CM | POA: Diagnosis not present

## 2019-03-13 DIAGNOSIS — Z992 Dependence on renal dialysis: Secondary | ICD-10-CM | POA: Diagnosis not present

## 2019-03-13 DIAGNOSIS — D631 Anemia in chronic kidney disease: Secondary | ICD-10-CM | POA: Diagnosis not present

## 2019-03-13 DIAGNOSIS — N2581 Secondary hyperparathyroidism of renal origin: Secondary | ICD-10-CM | POA: Diagnosis not present

## 2019-03-13 DIAGNOSIS — N186 End stage renal disease: Secondary | ICD-10-CM | POA: Diagnosis not present

## 2019-03-13 DIAGNOSIS — K7460 Unspecified cirrhosis of liver: Secondary | ICD-10-CM | POA: Diagnosis not present

## 2019-03-15 DIAGNOSIS — N2581 Secondary hyperparathyroidism of renal origin: Secondary | ICD-10-CM | POA: Diagnosis not present

## 2019-03-15 DIAGNOSIS — Z992 Dependence on renal dialysis: Secondary | ICD-10-CM | POA: Diagnosis not present

## 2019-03-15 DIAGNOSIS — D509 Iron deficiency anemia, unspecified: Secondary | ICD-10-CM | POA: Diagnosis not present

## 2019-03-15 DIAGNOSIS — D631 Anemia in chronic kidney disease: Secondary | ICD-10-CM | POA: Diagnosis not present

## 2019-03-15 DIAGNOSIS — N186 End stage renal disease: Secondary | ICD-10-CM | POA: Diagnosis not present

## 2019-03-15 DIAGNOSIS — K7460 Unspecified cirrhosis of liver: Secondary | ICD-10-CM | POA: Diagnosis not present

## 2019-03-17 DIAGNOSIS — N186 End stage renal disease: Secondary | ICD-10-CM | POA: Diagnosis not present

## 2019-03-17 DIAGNOSIS — Z992 Dependence on renal dialysis: Secondary | ICD-10-CM | POA: Diagnosis not present

## 2019-03-17 DIAGNOSIS — K7460 Unspecified cirrhosis of liver: Secondary | ICD-10-CM | POA: Diagnosis not present

## 2019-03-17 DIAGNOSIS — D631 Anemia in chronic kidney disease: Secondary | ICD-10-CM | POA: Diagnosis not present

## 2019-03-17 DIAGNOSIS — N2581 Secondary hyperparathyroidism of renal origin: Secondary | ICD-10-CM | POA: Diagnosis not present

## 2019-03-17 DIAGNOSIS — D509 Iron deficiency anemia, unspecified: Secondary | ICD-10-CM | POA: Diagnosis not present

## 2019-03-20 DIAGNOSIS — N2581 Secondary hyperparathyroidism of renal origin: Secondary | ICD-10-CM | POA: Diagnosis not present

## 2019-03-20 DIAGNOSIS — Z992 Dependence on renal dialysis: Secondary | ICD-10-CM | POA: Diagnosis not present

## 2019-03-20 DIAGNOSIS — D509 Iron deficiency anemia, unspecified: Secondary | ICD-10-CM | POA: Diagnosis not present

## 2019-03-20 DIAGNOSIS — K7460 Unspecified cirrhosis of liver: Secondary | ICD-10-CM | POA: Diagnosis not present

## 2019-03-20 DIAGNOSIS — N186 End stage renal disease: Secondary | ICD-10-CM | POA: Diagnosis not present

## 2019-03-20 DIAGNOSIS — D631 Anemia in chronic kidney disease: Secondary | ICD-10-CM | POA: Diagnosis not present

## 2019-03-22 DIAGNOSIS — D631 Anemia in chronic kidney disease: Secondary | ICD-10-CM | POA: Diagnosis not present

## 2019-03-22 DIAGNOSIS — D509 Iron deficiency anemia, unspecified: Secondary | ICD-10-CM | POA: Diagnosis not present

## 2019-03-22 DIAGNOSIS — Z992 Dependence on renal dialysis: Secondary | ICD-10-CM | POA: Diagnosis not present

## 2019-03-22 DIAGNOSIS — N2581 Secondary hyperparathyroidism of renal origin: Secondary | ICD-10-CM | POA: Diagnosis not present

## 2019-03-22 DIAGNOSIS — K7460 Unspecified cirrhosis of liver: Secondary | ICD-10-CM | POA: Diagnosis not present

## 2019-03-22 DIAGNOSIS — N186 End stage renal disease: Secondary | ICD-10-CM | POA: Diagnosis not present

## 2019-03-24 DIAGNOSIS — D631 Anemia in chronic kidney disease: Secondary | ICD-10-CM | POA: Diagnosis not present

## 2019-03-24 DIAGNOSIS — Z992 Dependence on renal dialysis: Secondary | ICD-10-CM | POA: Diagnosis not present

## 2019-03-24 DIAGNOSIS — N2581 Secondary hyperparathyroidism of renal origin: Secondary | ICD-10-CM | POA: Diagnosis not present

## 2019-03-24 DIAGNOSIS — N186 End stage renal disease: Secondary | ICD-10-CM | POA: Diagnosis not present

## 2019-03-24 DIAGNOSIS — K7460 Unspecified cirrhosis of liver: Secondary | ICD-10-CM | POA: Diagnosis not present

## 2019-03-24 DIAGNOSIS — D509 Iron deficiency anemia, unspecified: Secondary | ICD-10-CM | POA: Diagnosis not present

## 2019-03-27 DIAGNOSIS — N186 End stage renal disease: Secondary | ICD-10-CM | POA: Diagnosis not present

## 2019-03-27 DIAGNOSIS — Z992 Dependence on renal dialysis: Secondary | ICD-10-CM | POA: Diagnosis not present

## 2019-03-27 DIAGNOSIS — K7460 Unspecified cirrhosis of liver: Secondary | ICD-10-CM | POA: Diagnosis not present

## 2019-03-27 DIAGNOSIS — D509 Iron deficiency anemia, unspecified: Secondary | ICD-10-CM | POA: Diagnosis not present

## 2019-03-27 DIAGNOSIS — D631 Anemia in chronic kidney disease: Secondary | ICD-10-CM | POA: Diagnosis not present

## 2019-03-27 DIAGNOSIS — N2581 Secondary hyperparathyroidism of renal origin: Secondary | ICD-10-CM | POA: Diagnosis not present

## 2019-03-29 DIAGNOSIS — N2581 Secondary hyperparathyroidism of renal origin: Secondary | ICD-10-CM | POA: Diagnosis not present

## 2019-03-29 DIAGNOSIS — D631 Anemia in chronic kidney disease: Secondary | ICD-10-CM | POA: Diagnosis not present

## 2019-03-29 DIAGNOSIS — N186 End stage renal disease: Secondary | ICD-10-CM | POA: Diagnosis not present

## 2019-03-29 DIAGNOSIS — Z992 Dependence on renal dialysis: Secondary | ICD-10-CM | POA: Diagnosis not present

## 2019-03-29 DIAGNOSIS — K7460 Unspecified cirrhosis of liver: Secondary | ICD-10-CM | POA: Diagnosis not present

## 2019-03-29 DIAGNOSIS — D509 Iron deficiency anemia, unspecified: Secondary | ICD-10-CM | POA: Diagnosis not present

## 2019-03-31 DIAGNOSIS — D631 Anemia in chronic kidney disease: Secondary | ICD-10-CM | POA: Diagnosis not present

## 2019-03-31 DIAGNOSIS — N2581 Secondary hyperparathyroidism of renal origin: Secondary | ICD-10-CM | POA: Diagnosis not present

## 2019-03-31 DIAGNOSIS — D509 Iron deficiency anemia, unspecified: Secondary | ICD-10-CM | POA: Diagnosis not present

## 2019-03-31 DIAGNOSIS — N186 End stage renal disease: Secondary | ICD-10-CM | POA: Diagnosis not present

## 2019-03-31 DIAGNOSIS — K7460 Unspecified cirrhosis of liver: Secondary | ICD-10-CM | POA: Diagnosis not present

## 2019-03-31 DIAGNOSIS — Z992 Dependence on renal dialysis: Secondary | ICD-10-CM | POA: Diagnosis not present

## 2019-04-01 DIAGNOSIS — Z992 Dependence on renal dialysis: Secondary | ICD-10-CM | POA: Diagnosis not present

## 2019-04-01 DIAGNOSIS — I120 Hypertensive chronic kidney disease with stage 5 chronic kidney disease or end stage renal disease: Secondary | ICD-10-CM | POA: Diagnosis not present

## 2019-04-01 DIAGNOSIS — N186 End stage renal disease: Secondary | ICD-10-CM | POA: Diagnosis not present

## 2019-04-03 DIAGNOSIS — Z992 Dependence on renal dialysis: Secondary | ICD-10-CM | POA: Diagnosis not present

## 2019-04-03 DIAGNOSIS — N2581 Secondary hyperparathyroidism of renal origin: Secondary | ICD-10-CM | POA: Diagnosis not present

## 2019-04-03 DIAGNOSIS — D509 Iron deficiency anemia, unspecified: Secondary | ICD-10-CM | POA: Diagnosis not present

## 2019-04-03 DIAGNOSIS — N186 End stage renal disease: Secondary | ICD-10-CM | POA: Diagnosis not present

## 2019-04-03 DIAGNOSIS — D631 Anemia in chronic kidney disease: Secondary | ICD-10-CM | POA: Diagnosis not present

## 2019-04-05 DIAGNOSIS — N2581 Secondary hyperparathyroidism of renal origin: Secondary | ICD-10-CM | POA: Diagnosis not present

## 2019-04-05 DIAGNOSIS — D509 Iron deficiency anemia, unspecified: Secondary | ICD-10-CM | POA: Diagnosis not present

## 2019-04-05 DIAGNOSIS — Z992 Dependence on renal dialysis: Secondary | ICD-10-CM | POA: Diagnosis not present

## 2019-04-05 DIAGNOSIS — N186 End stage renal disease: Secondary | ICD-10-CM | POA: Diagnosis not present

## 2019-04-05 DIAGNOSIS — D631 Anemia in chronic kidney disease: Secondary | ICD-10-CM | POA: Diagnosis not present

## 2019-04-07 DIAGNOSIS — N186 End stage renal disease: Secondary | ICD-10-CM | POA: Diagnosis not present

## 2019-04-07 DIAGNOSIS — D631 Anemia in chronic kidney disease: Secondary | ICD-10-CM | POA: Diagnosis not present

## 2019-04-07 DIAGNOSIS — D509 Iron deficiency anemia, unspecified: Secondary | ICD-10-CM | POA: Diagnosis not present

## 2019-04-07 DIAGNOSIS — N2581 Secondary hyperparathyroidism of renal origin: Secondary | ICD-10-CM | POA: Diagnosis not present

## 2019-04-07 DIAGNOSIS — Z992 Dependence on renal dialysis: Secondary | ICD-10-CM | POA: Diagnosis not present

## 2019-04-10 DIAGNOSIS — N2581 Secondary hyperparathyroidism of renal origin: Secondary | ICD-10-CM | POA: Diagnosis not present

## 2019-04-10 DIAGNOSIS — D509 Iron deficiency anemia, unspecified: Secondary | ICD-10-CM | POA: Diagnosis not present

## 2019-04-10 DIAGNOSIS — N186 End stage renal disease: Secondary | ICD-10-CM | POA: Diagnosis not present

## 2019-04-10 DIAGNOSIS — Z992 Dependence on renal dialysis: Secondary | ICD-10-CM | POA: Diagnosis not present

## 2019-04-10 DIAGNOSIS — D631 Anemia in chronic kidney disease: Secondary | ICD-10-CM | POA: Diagnosis not present

## 2019-04-12 DIAGNOSIS — N2581 Secondary hyperparathyroidism of renal origin: Secondary | ICD-10-CM | POA: Diagnosis not present

## 2019-04-12 DIAGNOSIS — Z992 Dependence on renal dialysis: Secondary | ICD-10-CM | POA: Diagnosis not present

## 2019-04-12 DIAGNOSIS — D631 Anemia in chronic kidney disease: Secondary | ICD-10-CM | POA: Diagnosis not present

## 2019-04-12 DIAGNOSIS — N186 End stage renal disease: Secondary | ICD-10-CM | POA: Diagnosis not present

## 2019-04-12 DIAGNOSIS — D509 Iron deficiency anemia, unspecified: Secondary | ICD-10-CM | POA: Diagnosis not present

## 2019-04-14 DIAGNOSIS — Z992 Dependence on renal dialysis: Secondary | ICD-10-CM | POA: Diagnosis not present

## 2019-04-14 DIAGNOSIS — D631 Anemia in chronic kidney disease: Secondary | ICD-10-CM | POA: Diagnosis not present

## 2019-04-14 DIAGNOSIS — D509 Iron deficiency anemia, unspecified: Secondary | ICD-10-CM | POA: Diagnosis not present

## 2019-04-14 DIAGNOSIS — N186 End stage renal disease: Secondary | ICD-10-CM | POA: Diagnosis not present

## 2019-04-14 DIAGNOSIS — N2581 Secondary hyperparathyroidism of renal origin: Secondary | ICD-10-CM | POA: Diagnosis not present

## 2019-04-17 DIAGNOSIS — D509 Iron deficiency anemia, unspecified: Secondary | ICD-10-CM | POA: Diagnosis not present

## 2019-04-17 DIAGNOSIS — N186 End stage renal disease: Secondary | ICD-10-CM | POA: Diagnosis not present

## 2019-04-17 DIAGNOSIS — D631 Anemia in chronic kidney disease: Secondary | ICD-10-CM | POA: Diagnosis not present

## 2019-04-17 DIAGNOSIS — N2581 Secondary hyperparathyroidism of renal origin: Secondary | ICD-10-CM | POA: Diagnosis not present

## 2019-04-17 DIAGNOSIS — Z992 Dependence on renal dialysis: Secondary | ICD-10-CM | POA: Diagnosis not present

## 2019-04-19 DIAGNOSIS — N2581 Secondary hyperparathyroidism of renal origin: Secondary | ICD-10-CM | POA: Diagnosis not present

## 2019-04-19 DIAGNOSIS — D631 Anemia in chronic kidney disease: Secondary | ICD-10-CM | POA: Diagnosis not present

## 2019-04-19 DIAGNOSIS — N186 End stage renal disease: Secondary | ICD-10-CM | POA: Diagnosis not present

## 2019-04-19 DIAGNOSIS — Z992 Dependence on renal dialysis: Secondary | ICD-10-CM | POA: Diagnosis not present

## 2019-04-19 DIAGNOSIS — D509 Iron deficiency anemia, unspecified: Secondary | ICD-10-CM | POA: Diagnosis not present

## 2019-04-21 DIAGNOSIS — D631 Anemia in chronic kidney disease: Secondary | ICD-10-CM | POA: Diagnosis not present

## 2019-04-21 DIAGNOSIS — N186 End stage renal disease: Secondary | ICD-10-CM | POA: Diagnosis not present

## 2019-04-21 DIAGNOSIS — D509 Iron deficiency anemia, unspecified: Secondary | ICD-10-CM | POA: Diagnosis not present

## 2019-04-21 DIAGNOSIS — N2581 Secondary hyperparathyroidism of renal origin: Secondary | ICD-10-CM | POA: Diagnosis not present

## 2019-04-21 DIAGNOSIS — Z992 Dependence on renal dialysis: Secondary | ICD-10-CM | POA: Diagnosis not present

## 2019-04-23 DIAGNOSIS — D631 Anemia in chronic kidney disease: Secondary | ICD-10-CM | POA: Diagnosis not present

## 2019-04-23 DIAGNOSIS — Z992 Dependence on renal dialysis: Secondary | ICD-10-CM | POA: Diagnosis not present

## 2019-04-23 DIAGNOSIS — N186 End stage renal disease: Secondary | ICD-10-CM | POA: Diagnosis not present

## 2019-04-23 DIAGNOSIS — N2581 Secondary hyperparathyroidism of renal origin: Secondary | ICD-10-CM | POA: Diagnosis not present

## 2019-04-23 DIAGNOSIS — D509 Iron deficiency anemia, unspecified: Secondary | ICD-10-CM | POA: Diagnosis not present

## 2019-04-25 DIAGNOSIS — D631 Anemia in chronic kidney disease: Secondary | ICD-10-CM | POA: Diagnosis not present

## 2019-04-25 DIAGNOSIS — N186 End stage renal disease: Secondary | ICD-10-CM | POA: Diagnosis not present

## 2019-04-25 DIAGNOSIS — N2581 Secondary hyperparathyroidism of renal origin: Secondary | ICD-10-CM | POA: Diagnosis not present

## 2019-04-25 DIAGNOSIS — Z992 Dependence on renal dialysis: Secondary | ICD-10-CM | POA: Diagnosis not present

## 2019-04-25 DIAGNOSIS — D509 Iron deficiency anemia, unspecified: Secondary | ICD-10-CM | POA: Diagnosis not present

## 2019-04-28 DIAGNOSIS — D631 Anemia in chronic kidney disease: Secondary | ICD-10-CM | POA: Diagnosis not present

## 2019-04-28 DIAGNOSIS — D509 Iron deficiency anemia, unspecified: Secondary | ICD-10-CM | POA: Diagnosis not present

## 2019-04-28 DIAGNOSIS — N2581 Secondary hyperparathyroidism of renal origin: Secondary | ICD-10-CM | POA: Diagnosis not present

## 2019-04-28 DIAGNOSIS — N186 End stage renal disease: Secondary | ICD-10-CM | POA: Diagnosis not present

## 2019-04-28 DIAGNOSIS — Z992 Dependence on renal dialysis: Secondary | ICD-10-CM | POA: Diagnosis not present

## 2019-06-01 DIAGNOSIS — I120 Hypertensive chronic kidney disease with stage 5 chronic kidney disease or end stage renal disease: Secondary | ICD-10-CM | POA: Diagnosis not present

## 2019-06-01 DIAGNOSIS — N186 End stage renal disease: Secondary | ICD-10-CM | POA: Diagnosis not present

## 2019-06-01 DIAGNOSIS — Z992 Dependence on renal dialysis: Secondary | ICD-10-CM | POA: Diagnosis not present

## 2019-06-03 DIAGNOSIS — N2581 Secondary hyperparathyroidism of renal origin: Secondary | ICD-10-CM | POA: Diagnosis not present

## 2019-06-03 DIAGNOSIS — D631 Anemia in chronic kidney disease: Secondary | ICD-10-CM | POA: Diagnosis not present

## 2019-06-03 DIAGNOSIS — E875 Hyperkalemia: Secondary | ICD-10-CM | POA: Diagnosis not present

## 2019-06-03 DIAGNOSIS — Z992 Dependence on renal dialysis: Secondary | ICD-10-CM | POA: Diagnosis not present

## 2019-06-03 DIAGNOSIS — K7460 Unspecified cirrhosis of liver: Secondary | ICD-10-CM | POA: Diagnosis not present

## 2019-06-03 DIAGNOSIS — N186 End stage renal disease: Secondary | ICD-10-CM | POA: Diagnosis not present

## 2019-06-05 DIAGNOSIS — N186 End stage renal disease: Secondary | ICD-10-CM | POA: Diagnosis not present

## 2019-06-05 DIAGNOSIS — D631 Anemia in chronic kidney disease: Secondary | ICD-10-CM | POA: Diagnosis not present

## 2019-06-05 DIAGNOSIS — K7460 Unspecified cirrhosis of liver: Secondary | ICD-10-CM | POA: Diagnosis not present

## 2019-06-05 DIAGNOSIS — Z992 Dependence on renal dialysis: Secondary | ICD-10-CM | POA: Diagnosis not present

## 2019-06-05 DIAGNOSIS — N2581 Secondary hyperparathyroidism of renal origin: Secondary | ICD-10-CM | POA: Diagnosis not present

## 2019-06-05 DIAGNOSIS — E875 Hyperkalemia: Secondary | ICD-10-CM | POA: Diagnosis not present

## 2019-06-07 DIAGNOSIS — E875 Hyperkalemia: Secondary | ICD-10-CM | POA: Diagnosis not present

## 2019-06-07 DIAGNOSIS — N186 End stage renal disease: Secondary | ICD-10-CM | POA: Diagnosis not present

## 2019-06-07 DIAGNOSIS — N2581 Secondary hyperparathyroidism of renal origin: Secondary | ICD-10-CM | POA: Diagnosis not present

## 2019-06-07 DIAGNOSIS — K7460 Unspecified cirrhosis of liver: Secondary | ICD-10-CM | POA: Diagnosis not present

## 2019-06-07 DIAGNOSIS — Z992 Dependence on renal dialysis: Secondary | ICD-10-CM | POA: Diagnosis not present

## 2019-06-07 DIAGNOSIS — D631 Anemia in chronic kidney disease: Secondary | ICD-10-CM | POA: Diagnosis not present

## 2019-06-09 DIAGNOSIS — Z992 Dependence on renal dialysis: Secondary | ICD-10-CM | POA: Diagnosis not present

## 2019-06-09 DIAGNOSIS — K7460 Unspecified cirrhosis of liver: Secondary | ICD-10-CM | POA: Diagnosis not present

## 2019-06-09 DIAGNOSIS — N2581 Secondary hyperparathyroidism of renal origin: Secondary | ICD-10-CM | POA: Diagnosis not present

## 2019-06-09 DIAGNOSIS — N186 End stage renal disease: Secondary | ICD-10-CM | POA: Diagnosis not present

## 2019-06-09 DIAGNOSIS — D631 Anemia in chronic kidney disease: Secondary | ICD-10-CM | POA: Diagnosis not present

## 2019-06-09 DIAGNOSIS — E875 Hyperkalemia: Secondary | ICD-10-CM | POA: Diagnosis not present

## 2019-06-12 DIAGNOSIS — K7460 Unspecified cirrhosis of liver: Secondary | ICD-10-CM | POA: Diagnosis not present

## 2019-06-12 DIAGNOSIS — N2581 Secondary hyperparathyroidism of renal origin: Secondary | ICD-10-CM | POA: Diagnosis not present

## 2019-06-12 DIAGNOSIS — E875 Hyperkalemia: Secondary | ICD-10-CM | POA: Diagnosis not present

## 2019-06-12 DIAGNOSIS — N186 End stage renal disease: Secondary | ICD-10-CM | POA: Diagnosis not present

## 2019-06-12 DIAGNOSIS — D631 Anemia in chronic kidney disease: Secondary | ICD-10-CM | POA: Diagnosis not present

## 2019-06-12 DIAGNOSIS — Z992 Dependence on renal dialysis: Secondary | ICD-10-CM | POA: Diagnosis not present

## 2019-06-14 DIAGNOSIS — D631 Anemia in chronic kidney disease: Secondary | ICD-10-CM | POA: Diagnosis not present

## 2019-06-14 DIAGNOSIS — N186 End stage renal disease: Secondary | ICD-10-CM | POA: Diagnosis not present

## 2019-06-14 DIAGNOSIS — Z992 Dependence on renal dialysis: Secondary | ICD-10-CM | POA: Diagnosis not present

## 2019-06-14 DIAGNOSIS — K7460 Unspecified cirrhosis of liver: Secondary | ICD-10-CM | POA: Diagnosis not present

## 2019-06-14 DIAGNOSIS — N2581 Secondary hyperparathyroidism of renal origin: Secondary | ICD-10-CM | POA: Diagnosis not present

## 2019-06-14 DIAGNOSIS — E875 Hyperkalemia: Secondary | ICD-10-CM | POA: Diagnosis not present

## 2019-06-16 DIAGNOSIS — D631 Anemia in chronic kidney disease: Secondary | ICD-10-CM | POA: Diagnosis not present

## 2019-06-16 DIAGNOSIS — N186 End stage renal disease: Secondary | ICD-10-CM | POA: Diagnosis not present

## 2019-06-16 DIAGNOSIS — Z992 Dependence on renal dialysis: Secondary | ICD-10-CM | POA: Diagnosis not present

## 2019-06-16 DIAGNOSIS — K7460 Unspecified cirrhosis of liver: Secondary | ICD-10-CM | POA: Diagnosis not present

## 2019-06-16 DIAGNOSIS — N2581 Secondary hyperparathyroidism of renal origin: Secondary | ICD-10-CM | POA: Diagnosis not present

## 2019-06-16 DIAGNOSIS — E875 Hyperkalemia: Secondary | ICD-10-CM | POA: Diagnosis not present

## 2019-06-19 DIAGNOSIS — E875 Hyperkalemia: Secondary | ICD-10-CM | POA: Diagnosis not present

## 2019-06-19 DIAGNOSIS — N2581 Secondary hyperparathyroidism of renal origin: Secondary | ICD-10-CM | POA: Diagnosis not present

## 2019-06-19 DIAGNOSIS — N186 End stage renal disease: Secondary | ICD-10-CM | POA: Diagnosis not present

## 2019-06-19 DIAGNOSIS — Z992 Dependence on renal dialysis: Secondary | ICD-10-CM | POA: Diagnosis not present

## 2019-06-19 DIAGNOSIS — D631 Anemia in chronic kidney disease: Secondary | ICD-10-CM | POA: Diagnosis not present

## 2019-06-19 DIAGNOSIS — K7460 Unspecified cirrhosis of liver: Secondary | ICD-10-CM | POA: Diagnosis not present

## 2019-06-21 DIAGNOSIS — K7460 Unspecified cirrhosis of liver: Secondary | ICD-10-CM | POA: Diagnosis not present

## 2019-06-21 DIAGNOSIS — N186 End stage renal disease: Secondary | ICD-10-CM | POA: Diagnosis not present

## 2019-06-21 DIAGNOSIS — D631 Anemia in chronic kidney disease: Secondary | ICD-10-CM | POA: Diagnosis not present

## 2019-06-21 DIAGNOSIS — E875 Hyperkalemia: Secondary | ICD-10-CM | POA: Diagnosis not present

## 2019-06-21 DIAGNOSIS — N2581 Secondary hyperparathyroidism of renal origin: Secondary | ICD-10-CM | POA: Diagnosis not present

## 2019-06-21 DIAGNOSIS — Z992 Dependence on renal dialysis: Secondary | ICD-10-CM | POA: Diagnosis not present

## 2019-06-23 DIAGNOSIS — N186 End stage renal disease: Secondary | ICD-10-CM | POA: Diagnosis not present

## 2019-06-23 DIAGNOSIS — K7460 Unspecified cirrhosis of liver: Secondary | ICD-10-CM | POA: Diagnosis not present

## 2019-06-23 DIAGNOSIS — Z992 Dependence on renal dialysis: Secondary | ICD-10-CM | POA: Diagnosis not present

## 2019-06-23 DIAGNOSIS — E875 Hyperkalemia: Secondary | ICD-10-CM | POA: Diagnosis not present

## 2019-06-23 DIAGNOSIS — N2581 Secondary hyperparathyroidism of renal origin: Secondary | ICD-10-CM | POA: Diagnosis not present

## 2019-06-23 DIAGNOSIS — D631 Anemia in chronic kidney disease: Secondary | ICD-10-CM | POA: Diagnosis not present

## 2019-06-25 ENCOUNTER — Telehealth: Payer: Self-pay | Admitting: Cardiovascular Disease

## 2019-06-25 NOTE — Telephone Encounter (Signed)
Patient c/o Palpitations:  High priority if patient c/o lightheadedness, shortness of breath, or chest pain  1) How long have you had palpitations/irregular HR/ Afib? Are you having the symptoms now?  05/29/19, 01/19/2, 06/25/19  2) Are you currently experiencing lightheadedness, SOB or CP?  No  3) Do you have a history of afib (atrial fibrillation) or irregular heart rhythm?  Yes   4) Have you checked your BP or HR? (document readings if available):   06/23/19 107/64 HR 97  Are you experiencing any other symptoms? Not that they are aware of    Karen Morrow is calling from [facility] stating the patient has been experiencing Afib the last 3 times she has come into their office. She was no longer with patient when she called. An appointment has been scheduled regarding it on 07/04/19.

## 2019-06-25 NOTE — Telephone Encounter (Signed)
Called and spoke to patient-patient states she has been having chest pain lately but this only occurs at dialysis on treatment.   States she does get SOB "sometimes".  Unsure how long this has been occurring.  Denies palpitations, dizziness, lightheadedness. She is unsure if EKG was completed at dialysis.   Patient poor historian in regards to symptoms and history.  Patient does state she is currently asymptomatic.    Called Ann at [employer] to Benjamine Mola (MD at [facility]) who states since 12/29 patient has possibly been experiencing Afib.  States HR irregular to palpitation and ausculation.  States patient's BP drops, she gets weak and states this happens at home as well.  MD states Afib is listed in her history during one hospitalization but unclear if this is a true history or not.   No hx of AF noted in chart.  Not on anticoagulation.   MD requesting AFIB evaluation.  Spoke to patient, rescheduled appt for this week 1/27 at 11:30 AM with APP.   She is unable to come tomorrow due to transportation issues.   Patient verbalized understanding.

## 2019-06-26 DIAGNOSIS — N186 End stage renal disease: Secondary | ICD-10-CM | POA: Diagnosis not present

## 2019-06-26 DIAGNOSIS — Z992 Dependence on renal dialysis: Secondary | ICD-10-CM | POA: Diagnosis not present

## 2019-06-26 DIAGNOSIS — D631 Anemia in chronic kidney disease: Secondary | ICD-10-CM | POA: Diagnosis not present

## 2019-06-26 DIAGNOSIS — N2581 Secondary hyperparathyroidism of renal origin: Secondary | ICD-10-CM | POA: Diagnosis not present

## 2019-06-26 DIAGNOSIS — E875 Hyperkalemia: Secondary | ICD-10-CM | POA: Diagnosis not present

## 2019-06-26 DIAGNOSIS — K7460 Unspecified cirrhosis of liver: Secondary | ICD-10-CM | POA: Diagnosis not present

## 2019-06-27 ENCOUNTER — Encounter: Payer: Self-pay | Admitting: Cardiology

## 2019-06-27 ENCOUNTER — Ambulatory Visit: Payer: Medicare Other | Admitting: Cardiology

## 2019-06-28 DIAGNOSIS — D631 Anemia in chronic kidney disease: Secondary | ICD-10-CM | POA: Diagnosis not present

## 2019-06-28 DIAGNOSIS — K7460 Unspecified cirrhosis of liver: Secondary | ICD-10-CM | POA: Diagnosis not present

## 2019-06-28 DIAGNOSIS — Z992 Dependence on renal dialysis: Secondary | ICD-10-CM | POA: Diagnosis not present

## 2019-06-28 DIAGNOSIS — N186 End stage renal disease: Secondary | ICD-10-CM | POA: Diagnosis not present

## 2019-06-28 DIAGNOSIS — N2581 Secondary hyperparathyroidism of renal origin: Secondary | ICD-10-CM | POA: Diagnosis not present

## 2019-06-28 DIAGNOSIS — E875 Hyperkalemia: Secondary | ICD-10-CM | POA: Diagnosis not present

## 2019-06-30 DIAGNOSIS — D631 Anemia in chronic kidney disease: Secondary | ICD-10-CM | POA: Diagnosis not present

## 2019-06-30 DIAGNOSIS — Z992 Dependence on renal dialysis: Secondary | ICD-10-CM | POA: Diagnosis not present

## 2019-06-30 DIAGNOSIS — E875 Hyperkalemia: Secondary | ICD-10-CM | POA: Diagnosis not present

## 2019-06-30 DIAGNOSIS — N2581 Secondary hyperparathyroidism of renal origin: Secondary | ICD-10-CM | POA: Diagnosis not present

## 2019-06-30 DIAGNOSIS — K7460 Unspecified cirrhosis of liver: Secondary | ICD-10-CM | POA: Diagnosis not present

## 2019-06-30 DIAGNOSIS — N186 End stage renal disease: Secondary | ICD-10-CM | POA: Diagnosis not present

## 2019-07-02 DIAGNOSIS — I120 Hypertensive chronic kidney disease with stage 5 chronic kidney disease or end stage renal disease: Secondary | ICD-10-CM | POA: Diagnosis not present

## 2019-07-02 DIAGNOSIS — N186 End stage renal disease: Secondary | ICD-10-CM | POA: Diagnosis not present

## 2019-07-02 DIAGNOSIS — Z992 Dependence on renal dialysis: Secondary | ICD-10-CM | POA: Diagnosis not present

## 2019-07-03 DIAGNOSIS — N186 End stage renal disease: Secondary | ICD-10-CM | POA: Diagnosis not present

## 2019-07-03 DIAGNOSIS — D631 Anemia in chronic kidney disease: Secondary | ICD-10-CM | POA: Diagnosis not present

## 2019-07-03 DIAGNOSIS — N2581 Secondary hyperparathyroidism of renal origin: Secondary | ICD-10-CM | POA: Diagnosis not present

## 2019-07-03 DIAGNOSIS — Z992 Dependence on renal dialysis: Secondary | ICD-10-CM | POA: Diagnosis not present

## 2019-07-03 DIAGNOSIS — E875 Hyperkalemia: Secondary | ICD-10-CM | POA: Diagnosis not present

## 2019-07-04 ENCOUNTER — Ambulatory Visit: Payer: Medicare Other | Admitting: Cardiovascular Disease

## 2019-07-05 DIAGNOSIS — E875 Hyperkalemia: Secondary | ICD-10-CM | POA: Diagnosis not present

## 2019-07-05 DIAGNOSIS — N2581 Secondary hyperparathyroidism of renal origin: Secondary | ICD-10-CM | POA: Diagnosis not present

## 2019-07-05 DIAGNOSIS — Z992 Dependence on renal dialysis: Secondary | ICD-10-CM | POA: Diagnosis not present

## 2019-07-05 DIAGNOSIS — D631 Anemia in chronic kidney disease: Secondary | ICD-10-CM | POA: Diagnosis not present

## 2019-07-05 DIAGNOSIS — N186 End stage renal disease: Secondary | ICD-10-CM | POA: Diagnosis not present

## 2019-07-10 DIAGNOSIS — N186 End stage renal disease: Secondary | ICD-10-CM | POA: Diagnosis not present

## 2019-07-10 DIAGNOSIS — Z992 Dependence on renal dialysis: Secondary | ICD-10-CM | POA: Diagnosis not present

## 2019-07-10 DIAGNOSIS — D631 Anemia in chronic kidney disease: Secondary | ICD-10-CM | POA: Diagnosis not present

## 2019-07-10 DIAGNOSIS — N2581 Secondary hyperparathyroidism of renal origin: Secondary | ICD-10-CM | POA: Diagnosis not present

## 2019-07-10 DIAGNOSIS — E875 Hyperkalemia: Secondary | ICD-10-CM | POA: Diagnosis not present

## 2019-07-12 DIAGNOSIS — D631 Anemia in chronic kidney disease: Secondary | ICD-10-CM | POA: Diagnosis not present

## 2019-07-12 DIAGNOSIS — E875 Hyperkalemia: Secondary | ICD-10-CM | POA: Diagnosis not present

## 2019-07-12 DIAGNOSIS — N186 End stage renal disease: Secondary | ICD-10-CM | POA: Diagnosis not present

## 2019-07-12 DIAGNOSIS — N2581 Secondary hyperparathyroidism of renal origin: Secondary | ICD-10-CM | POA: Diagnosis not present

## 2019-07-12 DIAGNOSIS — Z992 Dependence on renal dialysis: Secondary | ICD-10-CM | POA: Diagnosis not present

## 2019-07-17 DIAGNOSIS — D631 Anemia in chronic kidney disease: Secondary | ICD-10-CM | POA: Diagnosis not present

## 2019-07-17 DIAGNOSIS — Z992 Dependence on renal dialysis: Secondary | ICD-10-CM | POA: Diagnosis not present

## 2019-07-17 DIAGNOSIS — E875 Hyperkalemia: Secondary | ICD-10-CM | POA: Diagnosis not present

## 2019-07-17 DIAGNOSIS — N186 End stage renal disease: Secondary | ICD-10-CM | POA: Diagnosis not present

## 2019-07-17 DIAGNOSIS — N2581 Secondary hyperparathyroidism of renal origin: Secondary | ICD-10-CM | POA: Diagnosis not present

## 2019-07-18 DIAGNOSIS — N186 End stage renal disease: Secondary | ICD-10-CM | POA: Diagnosis not present

## 2019-07-18 DIAGNOSIS — Z992 Dependence on renal dialysis: Secondary | ICD-10-CM | POA: Diagnosis not present

## 2019-07-18 DIAGNOSIS — I871 Compression of vein: Secondary | ICD-10-CM | POA: Diagnosis not present

## 2019-07-18 DIAGNOSIS — T82868A Thrombosis of vascular prosthetic devices, implants and grafts, initial encounter: Secondary | ICD-10-CM | POA: Diagnosis not present

## 2019-07-19 DIAGNOSIS — N2581 Secondary hyperparathyroidism of renal origin: Secondary | ICD-10-CM | POA: Diagnosis not present

## 2019-07-19 DIAGNOSIS — Z992 Dependence on renal dialysis: Secondary | ICD-10-CM | POA: Diagnosis not present

## 2019-07-19 DIAGNOSIS — E875 Hyperkalemia: Secondary | ICD-10-CM | POA: Diagnosis not present

## 2019-07-19 DIAGNOSIS — D631 Anemia in chronic kidney disease: Secondary | ICD-10-CM | POA: Diagnosis not present

## 2019-07-19 DIAGNOSIS — N186 End stage renal disease: Secondary | ICD-10-CM | POA: Diagnosis not present

## 2019-07-24 DIAGNOSIS — Z992 Dependence on renal dialysis: Secondary | ICD-10-CM | POA: Diagnosis not present

## 2019-07-24 DIAGNOSIS — E875 Hyperkalemia: Secondary | ICD-10-CM | POA: Diagnosis not present

## 2019-07-24 DIAGNOSIS — D631 Anemia in chronic kidney disease: Secondary | ICD-10-CM | POA: Diagnosis not present

## 2019-07-24 DIAGNOSIS — N2581 Secondary hyperparathyroidism of renal origin: Secondary | ICD-10-CM | POA: Diagnosis not present

## 2019-07-24 DIAGNOSIS — N186 End stage renal disease: Secondary | ICD-10-CM | POA: Diagnosis not present

## 2019-07-30 DIAGNOSIS — I120 Hypertensive chronic kidney disease with stage 5 chronic kidney disease or end stage renal disease: Secondary | ICD-10-CM | POA: Diagnosis not present

## 2019-07-30 DIAGNOSIS — Z992 Dependence on renal dialysis: Secondary | ICD-10-CM | POA: Diagnosis not present

## 2019-07-30 DIAGNOSIS — N186 End stage renal disease: Secondary | ICD-10-CM | POA: Diagnosis not present

## 2019-07-31 DIAGNOSIS — N2581 Secondary hyperparathyroidism of renal origin: Secondary | ICD-10-CM | POA: Diagnosis not present

## 2019-07-31 DIAGNOSIS — N186 End stage renal disease: Secondary | ICD-10-CM | POA: Diagnosis not present

## 2019-07-31 DIAGNOSIS — D509 Iron deficiency anemia, unspecified: Secondary | ICD-10-CM | POA: Diagnosis not present

## 2019-07-31 DIAGNOSIS — Z23 Encounter for immunization: Secondary | ICD-10-CM | POA: Diagnosis not present

## 2019-07-31 DIAGNOSIS — E875 Hyperkalemia: Secondary | ICD-10-CM | POA: Diagnosis not present

## 2019-07-31 DIAGNOSIS — D631 Anemia in chronic kidney disease: Secondary | ICD-10-CM | POA: Diagnosis not present

## 2019-07-31 DIAGNOSIS — Z992 Dependence on renal dialysis: Secondary | ICD-10-CM | POA: Diagnosis not present

## 2019-08-01 ENCOUNTER — Other Ambulatory Visit: Payer: Self-pay | Admitting: Physician Assistant

## 2019-08-01 ENCOUNTER — Other Ambulatory Visit: Payer: Self-pay

## 2019-08-01 ENCOUNTER — Encounter (HOSPITAL_COMMUNITY)
Admission: EM | Disposition: A | Discharge status: Home / Self Care | Payer: Self-pay | Source: Home / Self Care | Attending: Internal Medicine

## 2019-08-01 ENCOUNTER — Inpatient Hospital Stay (HOSPITAL_COMMUNITY)
Admission: EM | Admit: 2019-08-01 | Discharge: 2019-08-05 | DRG: 377 | Disposition: A | Discharge status: Home / Self Care | Payer: Medicare Other | Attending: Internal Medicine | Admitting: Internal Medicine

## 2019-08-01 ENCOUNTER — Observation Stay (HOSPITAL_COMMUNITY): Payer: Medicare Other | Admitting: Certified Registered"

## 2019-08-01 ENCOUNTER — Encounter (HOSPITAL_COMMUNITY): Payer: Self-pay | Admitting: Internal Medicine

## 2019-08-01 DIAGNOSIS — R109 Unspecified abdominal pain: Secondary | ICD-10-CM

## 2019-08-01 DIAGNOSIS — I132 Hypertensive heart and chronic kidney disease with heart failure and with stage 5 chronic kidney disease, or end stage renal disease: Secondary | ICD-10-CM | POA: Diagnosis not present

## 2019-08-01 DIAGNOSIS — I10 Essential (primary) hypertension: Secondary | ICD-10-CM | POA: Diagnosis present

## 2019-08-01 DIAGNOSIS — R579 Shock, unspecified: Secondary | ICD-10-CM

## 2019-08-01 DIAGNOSIS — Z832 Family history of diseases of the blood and blood-forming organs and certain disorders involving the immune mechanism: Secondary | ICD-10-CM

## 2019-08-01 DIAGNOSIS — J45909 Unspecified asthma, uncomplicated: Secondary | ICD-10-CM | POA: Diagnosis present

## 2019-08-01 DIAGNOSIS — D631 Anemia in chronic kidney disease: Secondary | ICD-10-CM | POA: Diagnosis present

## 2019-08-01 DIAGNOSIS — Z681 Body mass index (BMI) 19 or less, adult: Secondary | ICD-10-CM

## 2019-08-01 DIAGNOSIS — N2581 Secondary hyperparathyroidism of renal origin: Secondary | ICD-10-CM | POA: Diagnosis present

## 2019-08-01 DIAGNOSIS — Z8249 Family history of ischemic heart disease and other diseases of the circulatory system: Secondary | ICD-10-CM

## 2019-08-01 DIAGNOSIS — Z8719 Personal history of other diseases of the digestive system: Secondary | ICD-10-CM

## 2019-08-01 DIAGNOSIS — I5032 Chronic diastolic (congestive) heart failure: Secondary | ICD-10-CM | POA: Diagnosis present

## 2019-08-01 DIAGNOSIS — K922 Gastrointestinal hemorrhage, unspecified: Secondary | ICD-10-CM | POA: Diagnosis present

## 2019-08-01 DIAGNOSIS — Z9119 Patient's noncompliance with other medical treatment and regimen: Secondary | ICD-10-CM

## 2019-08-01 DIAGNOSIS — D62 Acute posthemorrhagic anemia: Secondary | ICD-10-CM | POA: Diagnosis not present

## 2019-08-01 DIAGNOSIS — K2971 Gastritis, unspecified, with bleeding: Secondary | ICD-10-CM | POA: Diagnosis present

## 2019-08-01 DIAGNOSIS — I5033 Acute on chronic diastolic (congestive) heart failure: Secondary | ICD-10-CM | POA: Diagnosis not present

## 2019-08-01 DIAGNOSIS — I471 Supraventricular tachycardia: Secondary | ICD-10-CM | POA: Diagnosis not present

## 2019-08-01 DIAGNOSIS — K760 Fatty (change of) liver, not elsewhere classified: Secondary | ICD-10-CM | POA: Diagnosis present

## 2019-08-01 DIAGNOSIS — R000 Tachycardia, unspecified: Secondary | ICD-10-CM | POA: Diagnosis not present

## 2019-08-01 DIAGNOSIS — Z20822 Contact with and (suspected) exposure to covid-19: Secondary | ICD-10-CM | POA: Diagnosis present

## 2019-08-01 DIAGNOSIS — K222 Esophageal obstruction: Secondary | ICD-10-CM | POA: Diagnosis present

## 2019-08-01 DIAGNOSIS — R159 Full incontinence of feces: Secondary | ICD-10-CM | POA: Diagnosis not present

## 2019-08-01 DIAGNOSIS — K319 Disease of stomach and duodenum, unspecified: Secondary | ICD-10-CM | POA: Diagnosis present

## 2019-08-01 DIAGNOSIS — F17210 Nicotine dependence, cigarettes, uncomplicated: Secondary | ICD-10-CM | POA: Diagnosis present

## 2019-08-01 DIAGNOSIS — K824 Cholesterolosis of gallbladder: Secondary | ICD-10-CM | POA: Diagnosis present

## 2019-08-01 DIAGNOSIS — K254 Chronic or unspecified gastric ulcer with hemorrhage: Secondary | ICD-10-CM | POA: Diagnosis not present

## 2019-08-01 DIAGNOSIS — Z90710 Acquired absence of both cervix and uterus: Secondary | ICD-10-CM

## 2019-08-01 DIAGNOSIS — R52 Pain, unspecified: Secondary | ICD-10-CM | POA: Diagnosis not present

## 2019-08-01 DIAGNOSIS — I248 Other forms of acute ischemic heart disease: Secondary | ICD-10-CM | POA: Diagnosis not present

## 2019-08-01 DIAGNOSIS — E876 Hypokalemia: Secondary | ICD-10-CM | POA: Diagnosis not present

## 2019-08-01 DIAGNOSIS — K259 Gastric ulcer, unspecified as acute or chronic, without hemorrhage or perforation: Secondary | ICD-10-CM | POA: Diagnosis not present

## 2019-08-01 DIAGNOSIS — Z833 Family history of diabetes mellitus: Secondary | ICD-10-CM

## 2019-08-01 DIAGNOSIS — Z03818 Encounter for observation for suspected exposure to other biological agents ruled out: Secondary | ICD-10-CM | POA: Diagnosis not present

## 2019-08-01 DIAGNOSIS — E875 Hyperkalemia: Secondary | ICD-10-CM | POA: Diagnosis not present

## 2019-08-01 DIAGNOSIS — I9589 Other hypotension: Secondary | ICD-10-CM | POA: Diagnosis not present

## 2019-08-01 DIAGNOSIS — R578 Other shock: Secondary | ICD-10-CM | POA: Diagnosis not present

## 2019-08-01 DIAGNOSIS — K3189 Other diseases of stomach and duodenum: Secondary | ICD-10-CM | POA: Diagnosis not present

## 2019-08-01 DIAGNOSIS — I4891 Unspecified atrial fibrillation: Secondary | ICD-10-CM | POA: Diagnosis present

## 2019-08-01 DIAGNOSIS — I110 Hypertensive heart disease with heart failure: Secondary | ICD-10-CM | POA: Diagnosis not present

## 2019-08-01 DIAGNOSIS — Z992 Dependence on renal dialysis: Secondary | ICD-10-CM | POA: Diagnosis not present

## 2019-08-01 DIAGNOSIS — R571 Hypovolemic shock: Secondary | ICD-10-CM | POA: Diagnosis not present

## 2019-08-01 DIAGNOSIS — E8889 Other specified metabolic disorders: Secondary | ICD-10-CM | POA: Diagnosis present

## 2019-08-01 DIAGNOSIS — N186 End stage renal disease: Secondary | ICD-10-CM | POA: Diagnosis present

## 2019-08-01 DIAGNOSIS — K449 Diaphragmatic hernia without obstruction or gangrene: Secondary | ICD-10-CM | POA: Diagnosis not present

## 2019-08-01 DIAGNOSIS — K920 Hematemesis: Secondary | ICD-10-CM | POA: Diagnosis not present

## 2019-08-01 DIAGNOSIS — R1111 Vomiting without nausea: Secondary | ICD-10-CM | POA: Diagnosis not present

## 2019-08-01 DIAGNOSIS — K921 Melena: Secondary | ICD-10-CM | POA: Diagnosis present

## 2019-08-01 DIAGNOSIS — I352 Nonrheumatic aortic (valve) stenosis with insufficiency: Secondary | ICD-10-CM | POA: Diagnosis present

## 2019-08-01 DIAGNOSIS — R54 Age-related physical debility: Secondary | ICD-10-CM | POA: Diagnosis present

## 2019-08-01 DIAGNOSIS — I509 Heart failure, unspecified: Secondary | ICD-10-CM | POA: Diagnosis not present

## 2019-08-01 DIAGNOSIS — N250 Renal osteodystrophy: Secondary | ICD-10-CM | POA: Diagnosis not present

## 2019-08-01 DIAGNOSIS — Z79899 Other long term (current) drug therapy: Secondary | ICD-10-CM

## 2019-08-01 DIAGNOSIS — K219 Gastro-esophageal reflux disease without esophagitis: Secondary | ICD-10-CM | POA: Diagnosis present

## 2019-08-01 DIAGNOSIS — B182 Chronic viral hepatitis C: Secondary | ICD-10-CM | POA: Diagnosis present

## 2019-08-01 DIAGNOSIS — I959 Hypotension, unspecified: Secondary | ICD-10-CM | POA: Diagnosis not present

## 2019-08-01 HISTORY — DX: Unspecified atrial fibrillation: I48.91

## 2019-08-01 HISTORY — PX: BIOPSY: SHX5522

## 2019-08-01 HISTORY — PX: ESOPHAGOGASTRODUODENOSCOPY (EGD) WITH PROPOFOL: SHX5813

## 2019-08-01 LAB — CBC WITH DIFFERENTIAL/PLATELET
Abs Immature Granulocytes: 0.07 10*3/uL (ref 0.00–0.07)
Basophils Absolute: 0 10*3/uL (ref 0.0–0.1)
Basophils Relative: 0 %
Eosinophils Absolute: 0 10*3/uL (ref 0.0–0.5)
Eosinophils Relative: 0 %
HCT: 27.8 % — ABNORMAL LOW (ref 36.0–46.0)
Hemoglobin: 9.1 g/dL — ABNORMAL LOW (ref 12.0–15.0)
Immature Granulocytes: 1 %
Lymphocytes Relative: 14 %
Lymphs Abs: 1.2 10*3/uL (ref 0.7–4.0)
MCH: 29.4 pg (ref 26.0–34.0)
MCHC: 32.7 g/dL (ref 30.0–36.0)
MCV: 89.7 fL (ref 80.0–100.0)
Monocytes Absolute: 0.6 10*3/uL (ref 0.1–1.0)
Monocytes Relative: 7 %
Neutro Abs: 7.1 10*3/uL (ref 1.7–7.7)
Neutrophils Relative %: 78 %
Platelets: 275 10*3/uL (ref 150–400)
RBC: 3.1 MIL/uL — ABNORMAL LOW (ref 3.87–5.11)
RDW: 13.6 % (ref 11.5–15.5)
WBC: 9.1 10*3/uL (ref 4.0–10.5)
nRBC: 0 % (ref 0.0–0.2)

## 2019-08-01 LAB — CBC
HCT: 22.8 % — ABNORMAL LOW (ref 36.0–46.0)
Hemoglobin: 7.8 g/dL — ABNORMAL LOW (ref 12.0–15.0)
MCH: 29.1 pg (ref 26.0–34.0)
MCHC: 34.2 g/dL (ref 30.0–36.0)
MCV: 85.1 fL (ref 80.0–100.0)
Platelets: 228 10*3/uL (ref 150–400)
RBC: 2.68 MIL/uL — ABNORMAL LOW (ref 3.87–5.11)
RDW: 13.1 % (ref 11.5–15.5)
WBC: 9 10*3/uL (ref 4.0–10.5)
nRBC: 0 % (ref 0.0–0.2)

## 2019-08-01 LAB — COMPREHENSIVE METABOLIC PANEL
ALT: 10 U/L (ref 0–44)
AST: 18 U/L (ref 15–41)
Albumin: 2.5 g/dL — ABNORMAL LOW (ref 3.5–5.0)
Alkaline Phosphatase: 59 U/L (ref 38–126)
Anion gap: 19 — ABNORMAL HIGH (ref 5–15)
BUN: 59 mg/dL — ABNORMAL HIGH (ref 6–20)
CO2: 24 mmol/L (ref 22–32)
Calcium: 9.2 mg/dL (ref 8.9–10.3)
Chloride: 95 mmol/L — ABNORMAL LOW (ref 98–111)
Creatinine, Ser: 7.49 mg/dL — ABNORMAL HIGH (ref 0.44–1.00)
GFR calc Af Amer: 6 mL/min — ABNORMAL LOW (ref >60)
GFR calc non Af Amer: 5 mL/min — ABNORMAL LOW (ref >60)
Glucose, Bld: 95 mg/dL (ref 70–99)
Potassium: 5.6 mmol/L — ABNORMAL HIGH (ref 3.5–5.1)
Sodium: 138 mmol/L (ref 135–145)
Total Bilirubin: 0.9 mg/dL (ref 0.3–1.2)
Total Protein: 5.8 g/dL — ABNORMAL LOW (ref 6.5–8.1)

## 2019-08-01 LAB — RESPIRATORY PANEL BY RT PCR (FLU A&B, COVID)
Influenza A by PCR: NEGATIVE
Influenza B by PCR: NEGATIVE
SARS Coronavirus 2 by RT PCR: NEGATIVE

## 2019-08-01 LAB — PREPARE RBC (CROSSMATCH)

## 2019-08-01 LAB — POC OCCULT BLOOD, ED: Fecal Occult Bld: POSITIVE — AB

## 2019-08-01 LAB — HIV ANTIBODY (ROUTINE TESTING W REFLEX): HIV Screen 4th Generation wRfx: NONREACTIVE

## 2019-08-01 LAB — PROTIME-INR
INR: 1.3 — ABNORMAL HIGH (ref 0.8–1.2)
Prothrombin Time: 15.8 seconds — ABNORMAL HIGH (ref 11.4–15.2)

## 2019-08-01 LAB — LACTIC ACID, PLASMA
Lactic Acid, Venous: 2.9 mmol/L (ref 0.5–1.9)
Lactic Acid, Venous: 1.5 mmol/L (ref 0.5–1.9)

## 2019-08-01 SURGERY — ESOPHAGOGASTRODUODENOSCOPY (EGD) WITH PROPOFOL
Anesthesia: Monitor Anesthesia Care

## 2019-08-01 MED ORDER — GABAPENTIN 100 MG PO CAPS
100.0000 mg | ORAL_CAPSULE | Freq: Every day | ORAL | Status: DC
  Administered 2019-08-01 – 2019-08-04 (×4): 100 mg via ORAL
  Filled 2019-08-01 (×4): qty 1

## 2019-08-01 MED ORDER — SODIUM CHLORIDE 0.9 % IV SOLN
8.0000 mg/h | INTRAVENOUS | Status: DC
  Administered 2019-08-01 – 2019-08-04 (×6): 8 mg/h via INTRAVENOUS
  Filled 2019-08-01 (×7): qty 80

## 2019-08-01 MED ORDER — CALCIUM CARBONATE ANTACID 1250 MG/5ML PO SUSP
500.0000 mg | Freq: Four times a day (QID) | ORAL | Status: DC | PRN
  Filled 2019-08-01: qty 5

## 2019-08-01 MED ORDER — ONDANSETRON HCL 4 MG/2ML IJ SOLN: 4.0000 mg | Freq: Four times a day (QID) | INTRAMUSCULAR | Status: DC | PRN

## 2019-08-01 MED ORDER — SODIUM ZIRCONIUM CYCLOSILICATE 10 G PO PACK
10.0000 g | PACK | Freq: Every day | ORAL | Status: AC
  Administered 2019-08-01: 10 g via ORAL
  Filled 2019-08-01: qty 1

## 2019-08-01 MED ORDER — CALCIUM ACETATE (PHOS BINDER) 667 MG PO CAPS
1334.0000 mg | ORAL_CAPSULE | Freq: Three times a day (TID) | ORAL | Status: DC
  Filled 2019-08-01: qty 2

## 2019-08-01 MED ORDER — SUCRALFATE 1 GM/10ML PO SUSP
1.0000 g | Freq: Three times a day (TID) | ORAL | Status: DC
  Administered 2019-08-01 – 2019-08-05 (×13): 1 g via ORAL
  Filled 2019-08-01 (×13): qty 10

## 2019-08-01 MED ORDER — ONDANSETRON HCL 4 MG PO TABS: 4.0000 mg | ORAL_TABLET | Freq: Four times a day (QID) | ORAL | Status: DC | PRN

## 2019-08-01 MED ORDER — SODIUM CHLORIDE 0.9 % IV SOLN: INTRAVENOUS | Status: DC

## 2019-08-01 MED ORDER — PROPOFOL 10 MG/ML IV BOLUS
INTRAVENOUS | Status: DC | PRN
  Administered 2019-08-01: 30 mg via INTRAVENOUS
  Administered 2019-08-01: 50 mg via INTRAVENOUS

## 2019-08-01 MED ORDER — LACTATED RINGERS IV SOLN: INTRAVENOUS | Status: DC

## 2019-08-01 MED ORDER — METOPROLOL TARTRATE 5 MG/5ML IV SOLN
2.5000 mg | INTRAVENOUS | Status: AC | PRN
  Administered 2019-08-02: 2.5 mg via INTRAVENOUS
  Filled 2019-08-01: qty 5

## 2019-08-01 MED ORDER — SORBITOL 70 % SOLN: 30.0000 mL | Status: DC | PRN

## 2019-08-01 MED ORDER — PHENYLEPHRINE 40 MCG/ML (10ML) SYRINGE FOR IV PUSH (FOR BLOOD PRESSURE SUPPORT)
PREFILLED_SYRINGE | INTRAVENOUS | Status: DC | PRN
  Administered 2019-08-01 (×2): 200 ug via INTRAVENOUS

## 2019-08-01 MED ORDER — PROPOFOL 500 MG/50ML IV EMUL
INTRAVENOUS | Status: DC | PRN
  Administered 2019-08-01: 125 ug/kg/min via INTRAVENOUS

## 2019-08-01 MED ORDER — HYDROXYZINE HCL 25 MG PO TABS: 25.0000 mg | ORAL_TABLET | Freq: Three times a day (TID) | ORAL | Status: DC | PRN

## 2019-08-01 MED ORDER — ALBUTEROL SULFATE (2.5 MG/3ML) 0.083% IN NEBU: 2.5000 mg | INHALATION_SOLUTION | Freq: Four times a day (QID) | RESPIRATORY_TRACT | Status: DC | PRN

## 2019-08-01 MED ORDER — CAMPHOR-MENTHOL 0.5-0.5 % EX LOTN
1.0000 "application " | TOPICAL_LOTION | Freq: Three times a day (TID) | CUTANEOUS | Status: DC | PRN
  Filled 2019-08-01: qty 222

## 2019-08-01 MED ORDER — FERRIC CITRATE 1 GM 210 MG(FE) PO TABS
420.0000 mg | ORAL_TABLET | Freq: Three times a day (TID) | ORAL | Status: DC
  Administered 2019-08-01 – 2019-08-05 (×9): 420 mg via ORAL
  Filled 2019-08-01 (×14): qty 2

## 2019-08-01 MED ORDER — ACETAMINOPHEN 650 MG RE SUPP: 650.0000 mg | Freq: Four times a day (QID) | RECTAL | Status: DC | PRN

## 2019-08-01 MED ORDER — ZOLPIDEM TARTRATE 5 MG PO TABS: 5.0000 mg | ORAL_TABLET | Freq: Every evening | ORAL | Status: DC | PRN

## 2019-08-01 MED ORDER — CINACALCET HCL 30 MG PO TABS
30.0000 mg | ORAL_TABLET | Freq: Every day | ORAL | Status: DC
  Administered 2019-08-01 – 2019-08-04 (×3): 30 mg via ORAL
  Filled 2019-08-01 (×5): qty 1

## 2019-08-01 MED ORDER — CHLORHEXIDINE GLUCONATE CLOTH 2 % EX PADS
6.0000 | MEDICATED_PAD | Freq: Every day | CUTANEOUS | Status: DC
  Administered 2019-08-02 – 2019-08-03 (×2): 6 via TOPICAL

## 2019-08-01 MED ORDER — SODIUM CHLORIDE 0.9% FLUSH
3.0000 mL | Freq: Two times a day (BID) | INTRAVENOUS | Status: DC
  Administered 2019-08-02 – 2019-08-05 (×6): 3 mL via INTRAVENOUS

## 2019-08-01 MED ORDER — NEPRO/CARBSTEADY PO LIQD: 237.0000 mL | Freq: Three times a day (TID) | ORAL | Status: DC | PRN

## 2019-08-01 MED ORDER — SODIUM CHLORIDE 0.9 % IV SOLN
80.0000 mg | Freq: Once | INTRAVENOUS | Status: AC
  Administered 2019-08-01: 80 mg via INTRAVENOUS
  Filled 2019-08-01: qty 80

## 2019-08-01 MED ORDER — PANTOPRAZOLE SODIUM 40 MG IV SOLR: 40.0000 mg | Freq: Two times a day (BID) | INTRAVENOUS | Status: DC

## 2019-08-01 MED ORDER — SODIUM CHLORIDE 0.9% IV SOLUTION: Freq: Once | INTRAVENOUS | Status: DC

## 2019-08-01 MED ORDER — LACTATED RINGERS IV BOLUS
500.0000 mL | Freq: Once | INTRAVENOUS | Status: AC
  Administered 2019-08-01: 500 mL via INTRAVENOUS

## 2019-08-01 MED ORDER — DOCUSATE SODIUM 283 MG RE ENEM
1.0000 | ENEMA | RECTAL | Status: DC | PRN
  Filled 2019-08-01: qty 1

## 2019-08-01 MED ORDER — ACETAMINOPHEN 325 MG PO TABS: 650.0000 mg | ORAL_TABLET | Freq: Four times a day (QID) | ORAL | Status: DC | PRN

## 2019-08-01 SURGICAL SUPPLY — 14 items

## 2019-08-01 NOTE — H&P (Addendum)
History and Physical    Karen Morrow MVH:[id number] DOB: March 31, 1961 DOA: 08/01/2019  PCP: [name], MD Consultants:  Gwenlyn Found - cardiology; Posey Pronto - nephrology; Gioffre - orthopedics  Patient coming from:  Home - lives with husband; NOK: Nevelyn Mellott, husband, [phone]  Chief Complaint: N/V/D with hematemesis  HPI: Karen Morrow is a 59 y.o. female with medical history significant of HTN; Hep C; ESRD on TTS HD; afib; and CHF presenting with n/v/d with hematemesis.  She reports vomiting blood x 2 days.  Also with diarrhea with blood.  She is getting increasingly weak, can't even walk.  She thinks this also happened to her last year in May.  She doesn't remember anything about that admission.  She denies abdominal pain.  She is no longer having hematemesis but it was black; also with black stools.  She is not on blood thinners, "I am on nothing."  No fever.  She was able to complete her entire session yesterday, but she had fecal incontinence there with dark stools.  HD did not send her to the hospital, she came in on her own.   ED Course:  H/o GI bleeding in the past.  At HD today, had hematemesis and dark stools.  Felt light-headed and sent to the ER. GI consult pending.  Tachycardia, hypotension slightly better.  Given blood, emergency release x 1 unit.  Heme positive, Hgb 9.1 with uncertain baseline (27-25?).    Review of Systems: As per HPI; otherwise review of systems reviewed and negative.   Ambulatory Status:  Ambulates without assistance or with a walker  Past Medical History:  Diagnosis Date  . Anginal pain ([place])   . Asthma   . Atrial fibrillation ([place])    not on AC  . CHF (congestive heart failure) ([place])   . Chronic kidney disease    dialysis 3x wk  . Dyspnea   . Frequent bowel movements   . GERD (gastroesophageal reflux disease)   . Hepatitis C antibody test positive   . Hypertension    "just dx'd today" (11/03/2012)    Past Surgical History:    Procedure Laterality Date  . ABDOMINAL HYSTERECTOMY     "partial" (11/03/2012)  . AV FISTULA PLACEMENT Right 12/03/2013   Procedure: CREATION OF ARTERIOVENOUS (AV) FISTULA RIGHT ARM ;  Surgeon: Rosetta Posner, MD;  Location: [place];  Service: Vascular;  Laterality: Right;  . AV FISTULA PLACEMENT Left 05/17/2018   Procedure: CREATION OF BRACHIOCEPHALIC FISTULA LEFT ARM;  Surgeon: Waynetta Sandy, MD;  Location: [facility];  Service: Vascular;  Laterality: Left;  . [place] REMOVAL Right 03/16/2018   Procedure: REMOVAL OF ARTERIOVENOUS GORETEX GRAFT ([place]) RIGHT ARM;  Surgeon: Waynetta Sandy, MD;  Location: [place];  Service: Vascular;  Laterality: Right;  . BASCILIC VEIN TRANSPOSITION Right 03/06/2014   Procedure: SECOND STAGE BASILIC VEIN TRANSPOSITION;  Surgeon: Rosetta Posner, MD;  Location: [place];  Service: Vascular;  Laterality: Right;  . BIOPSY  09/30/2018   Procedure: BIOPSY;  Surgeon: Mauri Pole, MD;  Location: [facility] ENDOSCOPY;  Service: Endoscopy;;  . ENTEROSCOPY N/A 10/06/2018   Procedure: ENTEROSCOPY;  Surgeon: Lavena Bullion, DO;  Location: [place];  Service: Gastroenterology;  Laterality: N/A;  . ESOPHAGOGASTRODUODENOSCOPY N/A 06/05/2013   Procedure: ESOPHAGOGASTRODUODENOSCOPY (EGD);  Surgeon: Winfield Cunas., MD;  Location: [facility];  Service: Endoscopy;  Laterality: N/A;  . ESOPHAGOGASTRODUODENOSCOPY N/A 09/08/2016   Procedure: ESOPHAGOGASTRODUODENOSCOPY (EGD);  Surgeon: Milus Banister, MD;  Location:  [place] ENDOSCOPY;  Service: Endoscopy;  Laterality: N/A;  . ESOPHAGOGASTRODUODENOSCOPY (EGD) WITH PROPOFOL N/A 09/17/2018   Procedure: ESOPHAGOGASTRODUODENOSCOPY (EGD) WITH PROPOFOL;  Surgeon: Carol Ada, MD;  Location: [place];  Service: Endoscopy;  Laterality: N/A;  . ESOPHAGOGASTRODUODENOSCOPY (EGD) WITH PROPOFOL N/A 09/30/2018   Procedure: ESOPHAGOGASTRODUODENOSCOPY (EGD) WITH PROPOFOL;  Surgeon: Mauri Pole, MD;  Location: [facility] ENDOSCOPY;  Service:  Endoscopy;  Laterality: N/A;  . INSERTION OF DIALYSIS CATHETER Left 03/16/2018   Procedure: PLACEMENT OF TUNNEL DIALYSIS CATHETER;  Surgeon: Waynetta Sandy, MD;  Location: [place] [facility];  Service: Vascular;  Laterality: Left;  . INSERTION OF DIALYSIS CATHETER Right 09/26/2018   Procedure: INSERTION OF DIALYSIS  TUNNELED CATHETER;  Surgeon: Elam Dutch, MD;  Location: [facility] OR;  Service: Vascular;  Laterality: Right;  . IR REMOVAL TUN CV CATH W/O FL  09/17/2018  . REVISON OF ARTERIOVENOUS FISTULA Right 01/28/2017   Procedure: REVISON OF RIGHT ARM ARTERIOVENOUS FISTULA USING 8MMX10CM GORETEX GRAFT;  Surgeon: Angelia Mould, MD;  Location: [facility] OR;  Service: Vascular;  Laterality: Right;    Social History   Socioeconomic History  . Marital status: Married    Spouse name: Not on file  . Number of children: Not on file  . Years of education: Not on file  . Highest education level: Not on file  Occupational History  . Occupation: disabled  Tobacco Use  . Smoking status: Current Every Day Smoker    Packs/day: 1.00    Years: 40.00    Pack years: 40.00    Types: Cigarettes  . Smokeless tobacco: Never Used  . Tobacco comment: now down to 1/2 ppd  Substance and Sexual Activity  . Alcohol use: Not Currently    Alcohol/week: 0.0 standard drinks    Comment: never a heavy drinker, hasn't drank in years  . Drug use: Not Currently    Types: Marijuana    Comment: used to smoke once a day, remote h/o MJ  . Sexual activity: Not Currently  Other Topics Concern  . Not on file  Social History Narrative   Uses [employer] or her husband provides transportation- patient does not drive   Social Determinants of Radio broadcast assistant Strain:   . Difficulty of Paying Living Expenses: Not on file  Food Insecurity: Food Insecurity Present  . Worried About Charity fundraiser in the Last Year: Sometimes true  . Ran Out of Food in the Last Year: Not on file  Transportation Needs: No  Transportation Needs  . Lack of Transportation (Medical): No  . Lack of Transportation (Non-Medical): No  Physical Activity:   . Days of Exercise per Week: Not on file  . Minutes of Exercise per Session: Not on file  Stress:   . Feeling of Stress : Not on file  Social Connections:   . Frequency of Communication with Friends and Family: Not on file  . Frequency of Social Gatherings with Friends and Family: Not on file  . Attends Religious Services: Not on file  . Active Member of Clubs or Organizations: Not on file  . Attends Archivist Meetings: Not on file  . Marital Status: Not on file  Intimate Partner Violence:   . Fear of Current or Ex-Partner: Not on file  . Emotionally Abused: Not on file  . Physically Abused: Not on file  . Sexually Abused: Not on file    Allergies  Allergen Reactions  . Cefazolin Other (See Comments)    Severe thrombocytopenia (  has tolerated zosyn in the past) ID aware Tolerated in October 2019  . Cephalosporins Anaphylaxis  . Tuberculin Swelling    Family History  Problem Relation Age of Onset  . Lupus Mother   . Diabetes Father   . Heart attack Father 57    Prior to Admission medications   Medication Sig Start Date End Date Taking? Authorizing Provider  acetaminophen (TYLENOL) 500 MG tablet Take 500-1,000 mg by mouth every 6 (six) hours as needed for headache (pain).    [provider]  albuterol (PROVENTIL HFA;VENTOLIN HFA) 108 (90 Base) MCG/ACT inhaler TAKE 2 PUFFS BY MOUTH EVERY 6 HOURS AS NEEDED FOR WHEEZE OR SHORTNESS OF BREATH 05/09/18   [name], MD  Amino Acids-Protein Hydrolys (FEEDING SUPPLEMENT, PRO-STAT SUGAR FREE 64,) LIQD Take 30 mLs by mouth 2 (two) times daily. 09/19/18   Hosie Poisson, MD  amLODipine (NORVASC) 10 MG tablet Take 1 tablet (10 mg total) by mouth daily. Patient taking differently: Take 10 mg by mouth at bedtime.  05/16/18   Thurnell Lose, MD  b complex-vitamin c-folic acid  (NEPHRO-VITE) 0.8 MG TABS tablet Take 1 tablet by mouth every evening. 07/27/18   [provider]  cinacalcet (SENSIPAR) 30 MG tablet TAKE 1 TABLET BY MOUTH DAILY EVERY EVENING (DO NOT TAKE LESS THAN 12 HOURS PRIOR TO DIALYSIS) Patient taking differently: Take 30 mg by mouth daily with supper. (DO NOT TAKE LESS THAN 12 HOURS PRIOR TO DIALYSIS) 09/19/18   Hosie Poisson, MD  cloNIDine-chlorthalidone (CLORPRES) 0.1-15 MG tablet Take by mouth. Take 3 tab in the am in the pm and 2 at bedtime    [provider]  ferric citrate (AURYXIA) 1 GM 210 MG(Fe) tablet Take 420 mg by mouth 3 (three) times daily with meals.    [provider]  gabapentin (NEURONTIN) 100 MG capsule Take 100 mg by mouth at bedtime.    [provider]  labetalol (NORMODYNE) 200 MG tablet Take 200 mg by mouth 2 (two) times daily.    [provider]  pantoprazole (PROTONIX) 40 MG tablet Take 1 tablet (40 mg total) by mouth 2 (two) times daily. 10/01/18   Mercy Riding, MD    Physical Exam: Vitals:   08/01/19 0654 08/01/19 0658 08/01/19 0715 08/01/19 0800  BP:   99/65 90/63  Pulse:  (!) 123    Resp:    (!) 30  Temp: (!) 97.2 F (36.2 C)     TempSrc: Rectal     SpO2:         . General:  Appears calm but cachectic, cantankerous . Eyes:  PERRL, EOMI, normal lids other than B (R>L) xanthelasma . ENT:  grossly normal hearing, lips & tongue, mmm; poor dentition . Neck:  no LAD, masses or thyromegaly . Cardiovascular:  RR with tachycardia, no m/r/g. No LE edema.  Marland Kitchen Respiratory:   CTA bilaterally with no wheezes/rales/rhonchi.  Normal respiratory effort. . Abdomen:  soft, NT, ND, NABS . Skin:  no rash or induration seen on limited exam . Musculoskeletal:  grossly normal tone BUE/BLE, good ROM, no bony abnormality . Psychiatric:  flat mood and affect, speech fluent and appropriate, AOx3, generally unhappy at this time (doesn't like the bed, wants to eat/drink, etc) . Neurologic:  CN 2-12  grossly intact, moves all extremities in coordinated fashion    Radiological Exams on Admission: No results found.  EKG: not done   Labs on Admission: I have personally reviewed the available labs and  imaging studies at the time of the admission.  Pertinent labs:   K+ 5.6 BUN 59/Creatinine 7.49/GFR 6 Lactate 2.9 WBC 9.1; 12.9 on 6/29 with variable baseline Hgb 9.1 INR 1.3 Heme positive COVID negative   Assessment/Plan Principal Problem:   Acute upper GI bleeding Active Problems:   HTN (hypertension)   ESRD on dialysis (HCC)   Anemia in chronic kidney disease, on chronic dialysis (HCC)   Hyperkalemia   Chronic diastolic (congestive) heart failure (HCC)   Chronic hepatitis C (HCC)     Upper GI bleed -Patient's lightheadedness and fatigue are most likely caused by anemia secondary to upper GI bleeding.  -Patient with prior h/o EGDs:  -06/05/13 - large gastric ulcer, likely due to NSAIDs, not actively bleeding; moderate duodenal diverticula  -09/08/16 - distal gastric ulcer, possibly the same as prior  -09/17/18 - Grade A esophagitis; one non-bleeding gastric ulcer in antrum and pre-pyloric region of the stomach; a medium non-bleeding diverticulum in the second portion of the duodenum; minimal esophagitis  -09/30/18 - Grade C esophagitis; one non-bleeding cratered gastric ulcer with a clean base (pre-pyloric); scattered moderate inflammation in the gastric antrum, prepylorus, and at the pylorus  -10/06/18 - non-obstructing Schatzki ring; previous erosive esophagitis healed;  one non-bleeding gastric ulcer in antrum, similar to prior or possibly slightly smaller; mild non-ulcer gastritis -Given prior h/o gastritis and ulcers, this is likely the source; however, with her h/o Hep C esophageal varices is also a consideration -Will leave NPO in anticipation of EGD later today -GI consult is pending -The patient is currently mildly tachycardic with soft blood pressure, suggesting acute  volume loss.  -Type and screen were done in ED.  -One unit of blood was ordered by ED.  -will observe in progressive care unit for now -NS at 50 mL/hr - dialysis patient -Start IV pantoprazole drip -Zofran IV for nausea -Avoid NSAIDs and SQ heparin -Recheck CBC this afternoon and tomorrow AM  Anemia -Uncertain baseline given her h/o ESRD -She was transfused on admission x 1 unit -Will recheck CBC this PM and tomorrow AM without plan for further transfusion at this time -Continue Auryxia  ESRD on HD, with mild hyperkalemia -Patient on chronic TTS HD -Nephrology prn order set utilized -She does not appear to be volume overloaded or otherwise in need of acute HD -Nephrology has been notified of her admission -Gentle IVF hydration x 10 hours only -Continue Phoslo, Sensipar, Nephrovite  Chronic Hep C -Markedly elevated quant in 08/2018 -She does not appear to have followed up outpatient and so this is unlikely to have been treated -GI to arrange for f/u  Chronic diastolic CHF -15/0569 echo with preserved EF and grade 1 diastolic dysfunction -Appears to be compensated at this time -Volume management with HD  HTN -Soft BPs associated with GI bleeding -Hold BP medications - Norvasc, labetolol -Anticipate improvement with volume repletion and stopping the bleed     Note: This patient has been tested and is negative for the novel coronavirus COVID-19.  DVT prophylaxis:  SCDs Code Status:  Full - confirmed with patient Family Communication: None present; I left a message for the  patient's husband by telephone at the time of admission (he works nights for [employer] and so was likely sleeping). Disposition Plan: She is anticipated to d/c to home without [facility] services once her GI issues have been resolved. Consults called: GI Admission status: It is my clinical opinion that referral for OBSERVATION is reasonable and necessary in this patient  based on the above information  provided. The aforementioned taken together are felt to place the patient at high risk for further clinical deterioration. However it is anticipated that the patient may be medically stable for discharge from the hospital within 24 to 48 hours.     Karmen Bongo MD [facility]   How to contact the [facility] Attending or Consulting provider [place] or covering provider during after hours [place], for this patient?  1. Check the care team in [facility] and look for a) attending/consulting TR[facility] provider listed and b) the [facility] team listed 2. Log into [email or web address] and use [place]'s universal password to access. If you do not have the password, please contact the hospital operator. 3. Locate the [facility] provider you are looking for under [facility] and page to a number that you can be directly reached. 4. If you still have difficulty reaching the provider, please page the [facility] (Director on Call) for the Hospitalists listed on amion for assistance.   08/01/2019, 11:21 AM

## 2019-08-01 NOTE — Anesthesia Preprocedure Evaluation (Addendum)
Anesthesia Evaluation  Patient identified by MRN, date of birth, ID band Patient awake    Reviewed: Allergy & Precautions, NPO status , Patient's Chart, lab work & pertinent test results, reviewed documented beta blocker date and time   Airway Mallampati: II  TM Distance: >3 FB Neck ROM: Full    Dental  (+) Poor Dentition, Missing, Dental Advisory Given, Chipped   Pulmonary shortness of breath and with exertion, asthma , Current Smoker and Patient abstained from smoking.,  Chronic cough   Pulmonary exam normal breath sounds clear to auscultation       Cardiovascular hypertension, Pt. on medications and Pt. on home beta blockers + angina +CHF   Rhythm:Regular Rate:Tachycardia  EKG- ST,LVH with repolarization abn.    Neuro/Psych negative neurological ROS     GI/Hepatic hiatal hernia, PUD, GERD  Medicated and Controlled,(+) Hepatitis -, CHematemesis   Endo/Other  negative endocrine ROS  Renal/GU ESRF and DialysisRenal diseaseLast dialysis yesterday  negative genitourinary   Musculoskeletal negative musculoskeletal ROS (+)   Abdominal   Peds  Hematology  (+) anemia ,   Anesthesia Other Findings   Reproductive/Obstetrics                           Anesthesia Physical Anesthesia Plan  ASA: IV  Anesthesia Plan: MAC   Post-op Pain Management:    Induction:   PONV Risk Score and Plan: 2 and Ondansetron, Propofol infusion and Treatment may vary due to age or medical condition  Airway Management Planned: Natural Airway and Nasal Cannula  Additional Equipment:   Intra-op Plan:   Post-operative Plan:   Informed Consent: I have reviewed the patients History and Physical, chart, labs and discussed the procedure including the risks, benefits and alternatives for the proposed anesthesia with the patient or authorized representative who has indicated his/her understanding and acceptance.      Dental advisory given  Plan Discussed with: CRNA and Surgeon  Anesthesia Plan Comments:         Anesthesia Quick Evaluation

## 2019-08-01 NOTE — Consult Note (Signed)
Referring Provider: Dr. Dorise Bullion Primary Care Physician:  [name], MD Primary Gastroenterologist:  Althia Forts  Reason for Consultation:  Hematemesis  HPI: Karen Morrow is a 59 y.o. female with past medical history pertinent for CKD on ESRD, A Fib (not currently on anticoagulation), CHF, Hepatitis C (untreated), and gastritis presenting with a two-day history of hematemesis.  She states she was in her usual state of health until two days ago, when she started having coffee ground emesis.  She reports the emesis as black with some red blood.  Then, she started having several episodes of loose melanotic stools.  She also endorses feeling weak but denies syncope.  She denies abdominal pain.  Denies exposure to ulcerogenic meds such as ASA/NSAID's.  Prior to this, she had one formed bowel movement approximately every other day.  She notes a normal appetite and denies knowledge of recent weight loss.  She denies dysphagia or heartburn.  She denies family history of GI malignancies or inflammatory bowel disease.  CBC on arrival showed Hgb of 9.1.  Per review of [facility]), pt had a Hgb 14.6 on 07/05/2019 and 13.7 on 07/19/2019.  She similar symptoms in April 2020 and had four EGDs from April 2020 to May 2020, all showing antral gastritis with 1cm ulceration, which gradually decreased in size.  She was to continue PPI indefinitely based on those findings.  Patient states she takes Protonix daily prior to meals. H. Pylori negative on bx's.    Of note, she was diagnosed with Hepatitis C per positive Ab test in 2019 and was advised to follow-up with ID on an outpatient basis.  Patient states she did not present for follow-up and has not had treatment of Hepatitis C.    Past Medical History:  Diagnosis Date  . Anginal pain ([place])   . Asthma   . Atrial fibrillation ([place])    not on AC  . CHF (congestive heart failure) ([place])   . Chronic kidney disease     dialysis 3x wk  . Dyspnea   . Frequent bowel movements   . GERD (gastroesophageal reflux disease)   . Hepatitis C antibody test positive   . Hypertension    "just dx'd today" (11/03/2012)    Past Surgical History:  Procedure Laterality Date  . ABDOMINAL HYSTERECTOMY     "partial" (11/03/2012)  . AV FISTULA PLACEMENT Right 12/03/2013   Procedure: CREATION OF ARTERIOVENOUS (AV) FISTULA RIGHT ARM ;  Surgeon: Rosetta Posner, MD;  Location: [place];  Service: Vascular;  Laterality: Right;  . AV FISTULA PLACEMENT Left 05/17/2018   Procedure: CREATION OF BRACHIOCEPHALIC FISTULA LEFT ARM;  Surgeon: Waynetta Sandy, MD;  Location: [place];  Service: Vascular;  Laterality: Left;  . [place] REMOVAL Right 03/16/2018   Procedure: REMOVAL OF ARTERIOVENOUS GORETEX GRAFT ([place]) RIGHT ARM;  Surgeon: Waynetta Sandy, MD;  Location: [place];  Service: Vascular;  Laterality: Right;  . BASCILIC VEIN TRANSPOSITION Right 03/06/2014   Procedure: SECOND STAGE BASILIC VEIN TRANSPOSITION;  Surgeon: Rosetta Posner, MD;  Location: [place];  Service: Vascular;  Laterality: Right;  . BIOPSY  09/30/2018   Procedure: BIOPSY;  Surgeon: Mauri Pole, MD;  Location: [facility] ENDOSCOPY;  Service: Endoscopy;;  . ENTEROSCOPY N/A 10/06/2018   Procedure: ENTEROSCOPY;  Surgeon: Lavena Bullion, DO;  Location: [place];  Service: Gastroenterology;  Laterality: N/A;  . ESOPHAGOGASTRODUODENOSCOPY N/A 06/05/2013   Procedure: ESOPHAGOGASTRODUODENOSCOPY (EGD);  Surgeon: Winfield Cunas., MD;  Location: [facility] ENDOSCOPY;  Service: Endoscopy;  Laterality: N/A;  . ESOPHAGOGASTRODUODENOSCOPY N/A 09/08/2016   Procedure: ESOPHAGOGASTRODUODENOSCOPY (EGD);  Surgeon: Milus Banister, MD;  Location: [place];  Service: Endoscopy;  Laterality: N/A;  . ESOPHAGOGASTRODUODENOSCOPY (EGD) WITH PROPOFOL N/A 09/17/2018   Procedure: ESOPHAGOGASTRODUODENOSCOPY (EGD) WITH PROPOFOL;  Surgeon: Carol Ada, MD;  Location: [place];  Service:  Endoscopy;  Laterality: N/A;  . ESOPHAGOGASTRODUODENOSCOPY (EGD) WITH PROPOFOL N/A 09/30/2018   Procedure: ESOPHAGOGASTRODUODENOSCOPY (EGD) WITH PROPOFOL;  Surgeon: Mauri Pole, MD;  Location: [place] ENDOSCOPY;  Service: Endoscopy;  Laterality: N/A;  . INSERTION OF DIALYSIS CATHETER Left 03/16/2018   Procedure: PLACEMENT OF TUNNEL DIALYSIS CATHETER;  Surgeon: Waynetta Sandy, MD;  Location: [place];  Service: Vascular;  Laterality: Left;  . INSERTION OF DIALYSIS CATHETER Right 09/26/2018   Procedure: INSERTION OF DIALYSIS  TUNNELED CATHETER;  Surgeon: Elam Dutch, MD;  Location: [facility] OR;  Service: Vascular;  Laterality: Right;  . IR REMOVAL TUN CV CATH W/O FL  09/17/2018  . REVISON OF ARTERIOVENOUS FISTULA Right 01/28/2017   Procedure: REVISON OF RIGHT ARM ARTERIOVENOUS FISTULA USING 8MMX10CM GORETEX GRAFT;  Surgeon: Angelia Mould, MD;  Location: [place];  Service: Vascular;  Laterality: Right;    Prior to Admission medications   Medication Sig Start Date End Date Taking? Authorizing Provider  acetaminophen (TYLENOL) 500 MG tablet Take 500-1,000 mg by mouth every 6 (six) hours as needed for headache (pain).   Yes [provider]  albuterol (PROVENTIL HFA;VENTOLIN HFA) 108 (90 Base) MCG/ACT inhaler TAKE 2 PUFFS BY MOUTH EVERY 6 HOURS AS NEEDED FOR WHEEZE OR SHORTNESS OF BREATH Patient taking differently: Inhale 2 puffs into the lungs every 6 (six) hours as needed for wheezing or shortness of breath.  05/09/18  Yes [name], MD  amLODipine (NORVASC) 10 MG tablet Take 1 tablet (10 mg total) by mouth daily. Patient taking differently: Take 10 mg by mouth at bedtime.  05/16/18  Yes Thurnell Lose, MD  b complex-vitamin c-folic acid (NEPHRO-VITE) 0.8 MG TABS tablet Take 1 tablet by mouth every evening. 07/27/18  Yes [provider]  calcium acetate (PHOSLO) 667 MG capsule Take 1,334 mg by mouth 3 (three) times daily.   Yes [provider]   cinacalcet (SENSIPAR) 30 MG tablet TAKE 1 TABLET BY MOUTH DAILY EVERY EVENING (DO NOT TAKE LESS THAN 12 HOURS PRIOR TO DIALYSIS) Patient taking differently: Take 30 mg by mouth daily with supper. (DO NOT TAKE LESS THAN 12 HOURS PRIOR TO DIALYSIS) 09/19/18  Yes Hosie Poisson, MD  ferric citrate (AURYXIA) 1 GM 210 MG(Fe) tablet Take 420 mg by mouth 3 (three) times daily with meals.   Yes [provider]  pantoprazole (PROTONIX) 40 MG tablet Take 1 tablet (40 mg total) by mouth 2 (two) times daily. 10/01/18  Yes Mercy Riding, MD  Amino Acids-Protein Hydrolys (FEEDING SUPPLEMENT, PRO-STAT SUGAR FREE 64,) LIQD Take 30 mLs by mouth 2 (two) times daily. Patient not taking: Reported on 08/01/2019 09/19/18   Hosie Poisson, MD  labetalol (NORMODYNE) 200 MG tablet Take 200 mg by mouth 2 (two) times daily.    [provider]    Current Facility-Administered Medications  Medication Dose Route Frequency Provider Last Rate Last Admin  . 0.9 %  sodium chloride infusion (Manually program via Guardrails IV Fluids)   Intravenous Once Karmen Bongo, MD      . acetaminophen (TYLENOL) tablet 650 mg  650 mg Oral Q6H PRN Karmen Bongo, MD  Or  . acetaminophen (TYLENOL) suppository 650 mg  650 mg Rectal Q6H PRN Karmen Bongo, MD      . albuterol (PROVENTIL) (2.5 MG/3ML) 0.083% nebulizer solution 2.5 mg  2.5 mg Inhalation Q6H PRN Karmen Bongo, MD      . calcium acetate (PHOSLO) capsule 1,334 mg  1,334 mg Oral TID with meals Karmen Bongo, MD      . calcium carbonate (dosed in mg elemental calcium) suspension 500 mg of elemental calcium  500 mg of elemental calcium Oral Q6H PRN Karmen Bongo, MD      . camphor-menthol [facility]) lotion 1 application  1 application Topical J6B PRN Karmen Bongo, MD       And  . hydrOXYzine (ATARAX/VISTARIL) tablet 25 mg  25 mg Oral Q8H PRN Karmen Bongo, MD      . cinacalcet [facility]) tablet 30 mg  30 mg Oral Q supper Karmen Bongo, MD      . docusate  sodium [facility]) enema 283 mg  1 enema Rectal PRN Karmen Bongo, MD      . feeding supplement (NEPRO CARB STEADY) liquid 237 mL  237 mL Oral TID PRN Karmen Bongo, MD      . ferric citrate (AURYXIA) tablet 420 mg  420 mg Oral TID WC Karmen Bongo, MD      . gabapentin (NEURONTIN) capsule 100 mg  100 mg Oral QHS Karmen Bongo, MD      . lactated ringers infusion   Intravenous Continuous Karmen Bongo, MD      . ondansetron [facility]) tablet 4 mg  4 mg Oral Q6H PRN Karmen Bongo, MD       Or  . ondansetron [facility]) injection 4 mg  4 mg Intravenous Q6H PRN Karmen Bongo, MD      . pantoprazole (PROTONIX) 80 mg in sodium chloride 0.9 % 100 mL (0.8 mg/mL) infusion  8 mg/hr Intravenous Continuous Karmen Bongo, MD      . Derrill Memo ON 08/04/2019] pantoprazole (PROTONIX) injection 40 mg  40 mg Intravenous Lillia Mountain, MD      . sodium chloride flush (NS) 0.9 % injection 3 mL  3 mL Intravenous Q12H Karmen Bongo, MD      . sorbitol 70 % solution 30 mL  30 mL Oral PRN Karmen Bongo, MD      . zolpidem Lorrin Mais) tablet 5 mg  5 mg Oral QHS PRN Karmen Bongo, MD       Current Outpatient Medications  Medication Sig Dispense Refill  . acetaminophen (TYLENOL) 500 MG tablet Take 500-1,000 mg by mouth every 6 (six) hours as needed for headache (pain).    Marland Kitchen albuterol (PROVENTIL HFA;VENTOLIN HFA) 108 (90 Base) MCG/ACT inhaler TAKE 2 PUFFS BY MOUTH EVERY 6 HOURS AS NEEDED FOR WHEEZE OR SHORTNESS OF BREATH (Patient taking differently: Inhale 2 puffs into the lungs every 6 (six) hours as needed for wheezing or shortness of breath. ) 18 Inhaler 0  . amLODipine (NORVASC) 10 MG tablet Take 1 tablet (10 mg total) by mouth daily. (Patient taking differently: Take 10 mg by mouth at bedtime. ) 30 tablet 0  . b complex-vitamin c-folic acid (NEPHRO-VITE) 0.8 MG TABS tablet Take 1 tablet by mouth every evening.    . calcium acetate (PHOSLO) 667 MG capsule Take 1,334 mg by mouth 3 (three) times daily.    .  cinacalcet (SENSIPAR) 30 MG tablet TAKE 1 TABLET BY MOUTH DAILY EVERY EVENING (DO NOT TAKE LESS THAN 12 HOURS PRIOR TO DIALYSIS) (Patient taking differently:  Take 30 mg by mouth daily with supper. (DO NOT TAKE LESS THAN 12 HOURS PRIOR TO DIALYSIS)) 60 tablet 0  . ferric citrate (AURYXIA) 1 GM 210 MG(Fe) tablet Take 420 mg by mouth 3 (three) times daily with meals.    . pantoprazole (PROTONIX) 40 MG tablet Take 1 tablet (40 mg total) by mouth 2 (two) times daily. 60 tablet 0  . Amino Acids-Protein Hydrolys (FEEDING SUPPLEMENT, PRO-STAT SUGAR FREE 64,) LIQD Take 30 mLs by mouth 2 (two) times daily. (Patient not taking: Reported on 08/01/2019) 887 mL 0  . labetalol (NORMODYNE) 200 MG tablet Take 200 mg by mouth 2 (two) times daily.      Allergies as of 08/01/2019 - Review Complete 03/07/2019  Allergen Reaction Noted  . Cefazolin Other (See Comments) 12/05/2013  . Cephalosporins Anaphylaxis 03/07/2019  . Tuberculin Swelling 03/07/2019    Family History  Problem Relation Age of Onset  . Lupus Mother   . Diabetes Father   . Heart attack Father 51    Social History   Socioeconomic History  . Marital status: Married    Spouse name: Not on file  . Number of children: Not on file  . Years of education: Not on file  . Highest education level: Not on file  Occupational History  . Occupation: disabled  Tobacco Use  . Smoking status: Current Every Day Smoker    Packs/day: 1.00    Years: 40.00    Pack years: 40.00    Types: Cigarettes  . Smokeless tobacco: Never Used  . Tobacco comment: now down to 1/2 ppd  Substance and Sexual Activity  . Alcohol use: Not Currently    Alcohol/week: 0.0 standard drinks    Comment: never a heavy drinker, hasn't drank in years  . Drug use: Not Currently    Types: Marijuana    Comment: used to smoke once a day, remote h/o MJ  . Sexual activity: Not Currently  Other Topics Concern  . Not on file  Social History Narrative   Uses [employer] or her husband  provides transportation- patient does not drive   Social Determinants of Radio broadcast assistant Strain:   . Difficulty of Paying Living Expenses: Not on file  Food Insecurity: Food Insecurity Present  . Worried About Charity fundraiser in the Last Year: Sometimes true  . Ran Out of Food in the Last Year: Not on file  Transportation Needs: No Transportation Needs  . Lack of Transportation (Medical): No  . Lack of Transportation (Non-Medical): No  Physical Activity:   . Days of Exercise per Week: Not on file  . Minutes of Exercise per Session: Not on file  Stress:   . Feeling of Stress : Not on file  Social Connections:   . Frequency of Communication with Friends and Family: Not on file  . Frequency of Social Gatherings with Friends and Family: Not on file  . Attends Religious Services: Not on file  . Active Member of Clubs or Organizations: Not on file  . Attends Archivist Meetings: Not on file  . Marital Status: Not on file  Intimate Partner Violence:   . Fear of Current or Ex-Partner: Not on file  . Emotionally Abused: Not on file  . Physically Abused: Not on file  . Sexually Abused: Not on file    Review of Systems: denies CP, SOB; see HPI  Physical Exam: Vital signs in last 24 hours: Temp:  [97.2 F (36.2 C)] 97.2 F (  36.2 C) (03/03 0654) Pulse Rate:  [123] 123 (03/03 0658) Resp:  [18-30] 30 (03/03 0800) BP: (80-99)/(63-70) 90/63 (03/03 0800) SpO2:  [96 %-97 %] 96 % (03/03 0603)   General:  Alert, thin female lying in bed in NAD Head: Normocephalic and atraumatic. Eyes: Sclera clear, no icterus.   Lungs: Clear throughout to auscultation.   No wheezes, crackles, or rhonchi. No evident respiratory distress. Heart:  Tachycardic (120s) with regular rate; no murmurs, clicks, rubs,  or gallops. Abdomen: Soft, nontender, and nondistended. No masses or hepatosplenomegaly noted. Normal bowel sounds, without bruits, guarding, or rebound.   Pulses:  Normal  radial pulse is noted. Extremities:  Without edema. Neurologic:Alert and coherent;  grossly normal neurologically. Skin: Intact without significant lesions or rashes. Psych:  Alert and cooperative. Normal mood and affect.  Intake/Output from previous day: No intake/output data recorded. Intake/Output this shift: Total I/O In: 1000 [I.V.:500; IV Piggyback:500] Out: -   Lab Results: Recent Labs    08/01/19 0655  WBC 9.1  HGB 9.1*  HCT 27.8*  PLT 275   BMET Recent Labs    08/01/19 0655  NA 138  K 5.6*  CL 95*  CO2 24  GLUCOSE 95  BUN 59*  CREATININE 7.49*  CALCIUM 9.2   LFT Recent Labs    08/01/19 0655  PROT 5.8*  ALBUMIN 2.5*  AST 18  ALT 10  ALKPHOS 59  BILITOT 0.9   PT/INR Recent Labs    08/01/19 0701  LABPROT 15.8*  INR 1.3*    Studies/Results: No results found.  Impression: 1. Hematemesis and melena suggestive of acute upper GI bleeding, without frank hemodynamic instability.  Concern for PUD vs. Ischemic gastritis.    2. Acute posthemorrhagic anemia.  Hgb has decreased approximately 5 points since last month.  3. Hep C, w/out evid of cirrhosis based on prev. egd and currently nl plt count  4. ESRD--hemodialysis; a. Fib.  5. H/o noncompliance with f/u  Plan: 1. EGD with propofol today.  Nature/purpose/risks/benefits d/w pt and she's agreeable.  2. Continue Protonix infusion.  3. Transfuse as needed.  4.  Recommend consult with ID regarding Hepatitis C infection.   LOS: 0 days   Youlanda Mighty Jocelin Schuelke  08/01/2019, 11:27 AM   Pager [phone] If no answer or after 5 PM call [phone]

## 2019-08-01 NOTE — Plan of Care (Signed)
Alert and oriented denies pain black tarry stool copious amounts liquid consistency MD notified appetite fair feeds self able to move self in bed x 1 assist with transfers and ambulation still weak in legs in dialysis tu, th and saturdays. Fistula to right arm. No skin issues all skin intact will continue to monitor

## 2019-08-01 NOTE — Progress Notes (Signed)
Patient with 2 large volume episodes of black liquid stools.  Awaiting repeat CBC.  BP 98/53, P 109.  Dr. Cristina Gong notified; based on scope from earlier today, he believes her to be at low risk for further bleeding based on ulcer appearance.  He attributes recent bleeding to delayed transit of old blood.  For now, will monitor hemodynamics, CBC, and watch for recurrent bleeding while continuing the current plan.    Carlyon Shadow, M.D.

## 2019-08-01 NOTE — ED Provider Notes (Signed)
Pt seen by Dr Dayna Barker.  Please see his note.  Pt remains tachycardic, soft bp although not in any distress.  Pt states she is feeling better.  Feels thirsty.  I will consult with GI.  Consult medical service for admission.  Will need stepdown monitoring.  Discussed with Dr Lorin Mercy and Dr Watt Climes.    Dorie Rank, MD 08/01/19 [phone]

## 2019-08-01 NOTE — Progress Notes (Signed)
Spoke with Careers information officer and made her aware that we do not start PIV's just for lab draws, the lab has tried twice and been unsuccessful, so the RN was advised to call the MD for further orders

## 2019-08-01 NOTE — Op Note (Signed)
[facility] Patient Name: Karen Morrow Procedure Date : 08/01/2019 MRN: [id number] Attending MD: Ronald Lobo , MD Date of Birth: [birth year] CSN: [id number] Age: 59 Admit Type: Inpatient Procedure:                Upper GI endoscopy Indications:              Acute post hemorrhagic anemia, Hematemesis, Melena Providers:                Ronald Lobo, MD, Jeanella Cara, RN,                            Marguerita Merles, Technician, Lance Coon, CRNA Referring MD:              Medicines:                Monitored Anesthesia Care Complications:            No immediate complications. Estimated Blood Loss:     Estimated blood loss: none. Procedure:                Pre-Anesthesia Assessment:                           - Prior to the procedure, a History and Physical                            was performed, and patient medications and                            allergies were reviewed. The patient's tolerance of                            previous anesthesia was also reviewed. The risks                            and benefits of the procedure and the sedation                            options and risks were discussed with the patient.                            All questions were answered, and informed consent                            was obtained. Prior Anticoagulants: The patient has                            taken no previous anticoagulant or antiplatelet                            agents. ASA Grade Assessment: IV - A patient with                            severe systemic disease that is a constant threat  to life. After reviewing the risks and benefits,                            the patient was deemed in satisfactory condition to                            undergo the procedure.                           After obtaining informed consent, the endoscope was                            passed under direct vision. Throughout the                   procedure, the patient's blood pressure, pulse, and                            oxygen saturations were monitored continuously. The                            GIF-[id number] ([id number]) Olympus gastroscope was                            introduced through the mouth, and advanced to the                            second part of duodenum. The upper GI endoscopy was                            accomplished without difficulty. The patient                            tolerated the procedure well. Scope In: Scope Out: Findings:      The examined esophagus was normal. No varices present.      A widely patent Schatzki ring was found at the gastroesophageal junction.      A 4 cm hiatal hernia was present.      Hematin (altered blood/coffee-ground-like material)--moderate amount--       was found in the entire examined stomach. No clots, no fresh red blood.      One non-bleeding cratered gastric ulcer with pigmented material was       found on the greater curvature of the gastric antrum. No visible vessel;       no intervention. The lesion was 15 mm in largest dimension. Biopsies       were taken from its margin with a cold forceps for histology. Estimated       blood loss: none.      One non-bleeding cratered gastric ulcer with no stigmata of bleeding was       found on the posterior wall of the gastric antrum. The lesion was 5 mm       in largest dimension.      Diffuse severely congested mucosa with prominent folds and spasm was       found in the gastric antrum.      There was a deep ulcer scar on the anterior aspect  of the antrum with       white epithelialized mucosa that did not appear to be currently       ulcerated, but biopsies were obtained to confirm that.      The cardia and gastric fundus were normal on retroflexion.      The examined duodenum was normal. Impression:               - No active bleeding.                           - Normal esophagus. No varices.                            - Widely patent Schatzki ring.                           - 4 cm hiatal hernia.                           - Hematin (altered blood/coffee-ground-like                            material) in the entire stomach.                           - Non-bleeding gastric ulcer with pigmented                            material. Biopsied.                           - Non-bleeding gastric ulcer with no stigmata of                            bleeding.                           - Hemorrhagic gastropathy characterized by diffused                            adherent flecks of blood; cannot exclude portal                            gastropathy.                           - Normal examined duodenum. Recommendation:           - Await pathology results.                           - Continue present medications.                           - Add sucralfate suspension 1 gram PO QID for 2                            months. Procedure Code(s):        --- Professional ---  97026, Esophagogastroduodenoscopy, flexible,                            transoral; with biopsy, single or multiple Diagnosis Code(s):        --- Professional ---                           K22.2, Esophageal obstruction                           K92.2, Gastrointestinal hemorrhage, unspecified                           K25.9, Gastric ulcer, unspecified as acute or                            chronic, without hemorrhage or perforation                           K31.89, Other diseases of stomach and duodenum                           D62, Acute posthemorrhagic anemia                           K92.0, Hematemesis                           K92.1, Melena (includes Hematochezia) CPT copyright 2019 American Medical Association. All rights reserved. The codes documented in this report are preliminary and upon coder review may  be revised to meet current compliance requirements. Ronald Lobo, MD 08/01/2019 1:05:24 PM This report  has been signed electronically. Number of Addenda: 0

## 2019-08-01 NOTE — Transfer of Care (Signed)
Immediate Anesthesia Transfer of Care Note  Patient: Karen Morrow  Procedure(s) Performed: ESOPHAGOGASTRODUODENOSCOPY (EGD) WITH PROPOFOL (N/A ) BIOPSY  Patient Location: PACU  Anesthesia Type:MAC  Level of Consciousness: drowsy and patient cooperative  Airway & Oxygen Therapy: Patient Spontanous Breathing  Post-op Assessment: Report given to RN and Post -op Vital signs reviewed and stable  Post vital signs: Reviewed and stable  Last Vitals:  Vitals Value Taken Time  BP 91/61 08/01/19 1254  Temp    Pulse 121 08/01/19 1253  Resp 28 08/01/19 1256  SpO2 77 % 08/01/19 1253  Vitals shown include unvalidated device data.  Last Pain:  Vitals:   08/01/19 1140  TempSrc: Oral  PainSc: 0-No pain         Complications: No apparent anesthesia complications

## 2019-08-01 NOTE — Anesthesia Postprocedure Evaluation (Signed)
Anesthesia Post Note  Patient: Karen Morrow  Procedure(s) Performed: ESOPHAGOGASTRODUODENOSCOPY (EGD) WITH PROPOFOL (N/A ) BIOPSY     Patient location during evaluation: PACU Anesthesia Type: MAC Level of consciousness: awake and alert and oriented Pain management: pain level controlled Vital Signs Assessment: post-procedure vital signs reviewed and stable Respiratory status: spontaneous breathing, nonlabored ventilation and respiratory function stable Cardiovascular status: stable and blood pressure returned to baseline Postop Assessment: no apparent nausea or vomiting Anesthetic complications: no    Last Vitals:  Vitals:   08/01/19 1320 08/01/19 1330  BP: 95/60 (!) 69/44  Pulse: (!) 55   Resp: (!) 26 (!) 25  Temp:    SpO2: 100% 100%    Last Pain:  Vitals:   08/01/19 1255  TempSrc: Temporal  PainSc:                  Venissa Nappi A.

## 2019-08-01 NOTE — ED Triage Notes (Addendum)
Pt arrives via EMS from home where she has had nausea,vomiting, and diarrhea for the past 3 days. Pt reports hematemesis that started yesterday afternoon and diarrhea that was dark and tarry. Pt is a dialysis pt T/T/S and had a full dialysis appointment yesterday. Pt denies use of  blood thinner.

## 2019-08-01 NOTE — Progress Notes (Signed)
Patient's endoscopy was well-tolerated.  There was no active bleeding or fresh blood in the stomach, but there were a lot of coffee grounds, suggestive of perhaps hemorrhagic gastritis.  In addition, the patient had a moderately large, cratered gastric ulcer with a slightly dirty base but no visible vessel or adherent clot; in addition, there was a smaller gastric ulcer with a clean base, and a great deal of prepyloric mucosal edema and prominent folds.  The patient did not have any esophageal varices.  Plan:  1.  Await pathology on today's biopsies from the margin of her ulcer, which had a benign appearance overall  2.  Add sucralfate to the patient's current PPI therapy  3.  Have ordered clear liquid diet.  Can advance rapidly as long as there is no evidence of further bleeding.  Cleotis Nipper, M.D. Pager [phone] If no answer or after 5 PM call [phone]

## 2019-08-01 NOTE — Consult Note (Addendum)
[facility] Renal Consultation Note    Indication for Consultation:  Management of ESRD/hemodialysis, anemia, hypertension/volume, and secondary hyperparathyroidism.  HPI: Karen Morrow is a 59 y.o. female with a PMH incldung ESRD on dialysis, a. Fib, CHF, HTN and + hep c antibody who presented to the ED on 08/01/19 reporting weakness, melena and coffee ground emesis x 2 days. Patient has had GI bleeds in the past. She went to HD yesterday as scheduled and reports an episode of dark stool during treatment. She did complete her full treatment but decided to come to the ED after. In the ER, patient tachycardic, FOBT +, Hgb 9.1 compared to 13.7 on 07/19/19, BP soft, K+ 5.6.She received 1 unit PRBC. Patient was seen by GI and taken for endoscopy which revealed no active bleed but coffee ground suggestive of hemorrhagic gastritis and a moderately large cratered ulcer. Recommended PPI and sucralfate pending biopsy results.   Patient reports she is feeling fatigued and weak but denies SOB, cough, CP, palpitations, dizziness, fever, abdominal pain, N/V/D. She reports a poor appetite and weight loss lately but is hungry today. Of note, patient does have a history of frequent missed/shortened HD treatments. She does have lokelma on her outpatient med list for no-HD days but reports she does not take this. She had a Left BCF thrombectomy with angioplasty on 07/18/19 by Dr. Posey Pronto with concern that fistula may be failing, however pt reports no access problems at last HD.   Past Medical History:  Diagnosis Date  . Anginal pain ([place])   . Asthma   . Atrial fibrillation ([place])    not on AC  . CHF (congestive heart failure) ([place])   . Chronic kidney disease    dialysis 3x wk  . Dyspnea   . Frequent bowel movements   . GERD (gastroesophageal reflux disease)   . Hepatitis C antibody test positive   . Hypertension    "just dx'd today" (11/03/2012)   Past Surgical History:  Procedure Laterality Date   . ABDOMINAL HYSTERECTOMY     "partial" (11/03/2012)  . AV FISTULA PLACEMENT Right 12/03/2013   Procedure: CREATION OF ARTERIOVENOUS (AV) FISTULA RIGHT ARM ;  Surgeon: Rosetta Posner, MD;  Location: [place];  Service: Vascular;  Laterality: Right;  . AV FISTULA PLACEMENT Left 05/17/2018   Procedure: CREATION OF BRACHIOCEPHALIC FISTULA LEFT ARM;  Surgeon: Waynetta Sandy, MD;  Location: [place];  Service: Vascular;  Laterality: Left;  . [place] REMOVAL Right 03/16/2018   Procedure: REMOVAL OF ARTERIOVENOUS GORETEX GRAFT ([place]) RIGHT ARM;  Surgeon: Waynetta Sandy, MD;  Location: [place];  Service: Vascular;  Laterality: Right;  . BASCILIC VEIN TRANSPOSITION Right 03/06/2014   Procedure: SECOND STAGE BASILIC VEIN TRANSPOSITION;  Surgeon: Rosetta Posner, MD;  Location: [place];  Service: Vascular;  Laterality: Right;  . BIOPSY  09/30/2018   Procedure: BIOPSY;  Surgeon: Mauri Pole, MD;  Location: [facility] ENDOSCOPY;  Service: Endoscopy;;  . ENTEROSCOPY N/A 10/06/2018   Procedure: ENTEROSCOPY;  Surgeon: Lavena Bullion, DO;  Location: [place];  Service: Gastroenterology;  Laterality: N/A;  . ESOPHAGOGASTRODUODENOSCOPY N/A 06/05/2013   Procedure: ESOPHAGOGASTRODUODENOSCOPY (EGD);  Surgeon: Winfield Cunas., MD;  Location: [facility];  Service: Endoscopy;  Laterality: N/A;  . ESOPHAGOGASTRODUODENOSCOPY N/A 09/08/2016   Procedure: ESOPHAGOGASTRODUODENOSCOPY (EGD);  Surgeon: Milus Banister, MD;  Location: [place];  Service: Endoscopy;  Laterality: N/A;  . ESOPHAGOGASTRODUODENOSCOPY (EGD) WITH PROPOFOL N/A 09/17/2018   Procedure: ESOPHAGOGASTRODUODENOSCOPY (EGD) WITH PROPOFOL;  Surgeon: Carol Ada, MD;  Location: [place];  Service: Endoscopy;  Laterality: N/A;  . ESOPHAGOGASTRODUODENOSCOPY (EGD) WITH PROPOFOL N/A 09/30/2018   Procedure: ESOPHAGOGASTRODUODENOSCOPY (EGD) WITH PROPOFOL;  Surgeon: Mauri Pole, MD;  Location: [place] ENDOSCOPY;  Service: Endoscopy;  Laterality: N/A;  .  INSERTION OF DIALYSIS CATHETER Left 03/16/2018   Procedure: PLACEMENT OF TUNNEL DIALYSIS CATHETER;  Surgeon: Waynetta Sandy, MD;  Location: [place];  Service: Vascular;  Laterality: Left;  . INSERTION OF DIALYSIS CATHETER Right 09/26/2018   Procedure: INSERTION OF DIALYSIS  TUNNELED CATHETER;  Surgeon: Elam Dutch, MD;  Location: [facility] OR;  Service: Vascular;  Laterality: Right;  . IR REMOVAL TUN CV CATH W/O FL  09/17/2018  . REVISON OF ARTERIOVENOUS FISTULA Right 01/28/2017   Procedure: REVISON OF RIGHT ARM ARTERIOVENOUS FISTULA USING 8MMX10CM GORETEX GRAFT;  Surgeon: Angelia Mould, MD;  Location: [facility] OR;  Service: Vascular;  Laterality: Right;   Family History  Problem Relation Age of Onset  . Lupus Mother   . Diabetes Father   . Heart attack Father 36   Social History:  reports that she has been smoking cigarettes. She has a 40.00 pack-year smoking history. She has never used smokeless tobacco. She reports previous alcohol use. She reports previous drug use. Drug: Marijuana.  ROS: As per HPI otherwise negative.    Physical Exam: Vitals:   08/01/19 1340 08/01/19 1350 08/01/19 1356 08/01/19 1400  BP: (!) 72/48 (!) 65/25 (!) 78/35 (!) 92/40  Pulse:      Resp: (!) 23 (!) 21 (!) 24 (!) 23  Temp:      TempSrc:      SpO2: 100% 100% 100% 100%  Weight:      Height:         General: Well developed, thin female, in no acute distress. Head: Normocephalic, atraumatic, sclera non-icteric, mucus membranes are moist. Neck: Supple without lymphadenopathy/masses. JVD not elevated. Lungs: Clear bilaterally to auscultation without wheezes, rales, or rhonchi. Breathing is unlabored. Heart: RRR with normal S1, S2. No murmurs, rubs, or gallops appreciated. Abdomen: Soft, non-tender, non-distended with normoactive bowel sounds. No rebound/guarding. No obvious abdominal masses. Musculoskeletal:  Strength and tone appear normal for age. Lower extremities: No edema or ischemic  changes, no open wounds. Neuro: Alert and oriented X 3. Moves all extremities spontaneously. Psych:  Responds to questions appropriately with a normal affect. Dialysis Access: LUE AVF + thrill/bruit  Allergies  Allergen Reactions  . Cefazolin Other (See Comments)    Severe thrombocytopenia (has tolerated zosyn in the past) ID aware Tolerated in October 2019  . Cephalosporins Anaphylaxis  . Tuberculin Swelling   Prior to Admission medications   Medication Sig Start Date End Date Taking? Authorizing Provider  acetaminophen (TYLENOL) 500 MG tablet Take 500-1,000 mg by mouth every 6 (six) hours as needed for headache (pain).   Yes [provider]  albuterol (PROVENTIL HFA;VENTOLIN HFA) 108 (90 Base) MCG/ACT inhaler TAKE 2 PUFFS BY MOUTH EVERY 6 HOURS AS NEEDED FOR WHEEZE OR SHORTNESS OF BREATH Patient taking differently: Inhale 2 puffs into the lungs every 6 (six) hours as needed for wheezing or shortness of breath.  05/09/18  Yes [name], MD  amLODipine (NORVASC) 10 MG tablet Take 1 tablet (10 mg total) by mouth daily. Patient taking differently: Take 10 mg by mouth at bedtime.  05/16/18  Yes Thurnell Lose, MD  b complex-vitamin c-folic acid (NEPHRO-VITE) 0.8 MG TABS tablet Take 1 tablet by mouth every evening. 07/27/18  Yes [provider]  calcium acetate (PHOSLO) 667 MG capsule Take 1,334 mg by mouth 3 (three) times daily.   Yes [provider]  cinacalcet (SENSIPAR) 30 MG tablet TAKE 1 TABLET BY MOUTH DAILY EVERY EVENING (DO NOT TAKE LESS THAN 12 HOURS PRIOR TO DIALYSIS) Patient taking differently: Take 30 mg by mouth daily with supper. (DO NOT TAKE LESS THAN 12 HOURS PRIOR TO DIALYSIS) 09/19/18  Yes Hosie Poisson, MD  ferric citrate (AURYXIA) 1 GM 210 MG(Fe) tablet Take 420 mg by mouth 3 (three) times daily with meals.   Yes [provider]  pantoprazole (PROTONIX) 40 MG tablet Take 1 tablet (40 mg total) by mouth 2 (two) times daily. 10/01/18   Yes Mercy Riding, MD  Amino Acids-Protein Hydrolys (FEEDING SUPPLEMENT, PRO-STAT SUGAR FREE 64,) LIQD Take 30 mLs by mouth 2 (two) times daily. Patient not taking: Reported on 08/01/2019 09/19/18   Hosie Poisson, MD  labetalol (NORMODYNE) 200 MG tablet Take 200 mg by mouth 2 (two) times daily.    [provider]   Current Facility-Administered Medications  Medication Dose Route Frequency Provider Last Rate Last Admin  . 0.9 %  sodium chloride infusion (Manually program via Guardrails IV Fluids)   Intravenous Once Buccini, Herbie [place], MD      . acetaminophen (TYLENOL) tablet 650 mg  650 mg Oral Q6H PRN Buccini, Robert, MD       Or  . acetaminophen (TYLENOL) suppository 650 mg  650 mg Rectal Q6H PRN Buccini, Robert, MD      . albuterol (PROVENTIL) (2.5 MG/3ML) 0.083% nebulizer solution 2.5 mg  2.5 mg Inhalation Q6H PRN Buccini, Robert, MD      . calcium acetate (PHOSLO) capsule 1,334 mg  1,334 mg Oral TID with meals Buccini, Robert, MD      . calcium carbonate (dosed in mg elemental calcium) suspension 500 mg of elemental calcium  500 mg of elemental calcium Oral Q6H PRN Buccini, Robert, MD      . camphor-[facility]) lotion 1 application  1 application Topical K4M PRN Buccini, Robert, MD       And  . hydrOXYzine (ATARAX/VISTARIL) tablet 25 mg  25 mg Oral Q8H PRN Buccini, Robert, MD      . cinacalcet (SENSIPAR) tablet 30 mg  30 mg Oral Q supper Buccini, Robert, MD      . docusate sodium (ENEMEEZ) enema 283 mg  1 enema Rectal PRN Buccini, Robert, MD      . feeding supplement (NEPRO CARB STEADY) liquid 237 mL  237 mL Oral TID PRN Buccini, Robert, MD      . ferric citrate (AURYXIA) tablet 420 mg  420 mg Oral TID WC Buccini, Robert, MD      . gabapentin (NEURONTIN) capsule 100 mg  100 mg Oral QHS Buccini, Robert, MD      . lactated ringers infusion   Intravenous Continuous Ronald Lobo, MD 50 mL/hr at 08/01/19 1302 New Bag at 08/01/19 1302  . ondansetron (ZOFRAN) tablet 4 mg  4 mg Oral Q6H  PRN Buccini, Robert, MD       Or  . ondansetron (ZOFRAN) injection 4 mg  4 mg Intravenous Q6H PRN Buccini, Robert, MD      . pantoprazole (PROTONIX) 80 mg in sodium chloride 0.9 % 100 mL (0.8 mg/mL) infusion  8 mg/hr Intravenous Continuous Buccini, Robert, MD      . Derrill Memo ON 08/04/2019] pantoprazole (PROTONIX) injection 40 mg  40 mg Intravenous Q12H Ronald Lobo, MD      .  sodium chloride flush (NS) 0.9 % injection 3 mL  3 mL Intravenous Q12H Buccini, Robert, MD      . sorbitol 70 % solution 30 mL  30 mL Oral PRN Buccini, Robert, MD      . sucralfate (CARAFATE) 1 GM/10ML suspension 1 g  1 g Oral TID WC & HS Buccini, Robert, MD      . zolpidem (AMBIEN) tablet 5 mg  5 mg Oral QHS PRN Ronald Lobo, MD       Labs: Basic Metabolic Panel: Recent Labs  Lab 08/01/19 0655  NA 138  K 5.6*  CL 95*  CO2 24  GLUCOSE 95  BUN 59*  CREATININE 7.49*  CALCIUM 9.2   Liver Function Tests: Recent Labs  Lab 08/01/19 0655  AST 18  ALT 10  ALKPHOS 59  BILITOT 0.9  PROT 5.8*  ALBUMIN 2.5*   CBC: Recent Labs  Lab 08/01/19 0655  WBC 9.1  NEUTROABS 7.1  HGB 9.1*  HCT 27.8*  MCV 89.7  PLT 275   Dialysis Orders: Center: [facility]  on TTS . 180NRe, 3:45hr, BFR 350/ DFR 600, EDW 51kg, 2K/2.5Ca, 16g needles hectorol 3 mcg IV q HD Lokelma 10g on non-HD days Auryxia 2 tabs TID with meals (pt reports taking "blue and white pill" which is likely calcium acetate)  Assessment/Plan: 1.  Acute blood loss anemia 2/2 GI bleed: Patient reported 2 days and coffee ground emesis. Hgb 9.1 compared to baseline 13-14. Given 1 unit PRBC and taken for EGD which was suggestive of hemorrhagic gastritis and gastric ulcer. Post transfusion Hgb pending, will start ESA as needed pending results. 2.  ESRD:  TTS schedule- next HD 3/4 3. Hyperkalemia: K+ 5.6- supposed to be taking lokelma at home but reports not taking it, will order 1 dose of lokelma today then expect further K+ reduction with  HD tomorrow.  4.  Hypertension/volume: BP soft, not on any BP meds. Above her EDW but is euvolemic on exam. Minimal UF with HD tomorrow.  5.  Metabolic bone disease: Corrected calcium 10.4- likely because patient has been taking calcium acetate rather than [place]. D/c calcium acetate and resume [place] once eating. Will hold hectorol for now. 6.  Nutrition:  On clear liquids, renal diet with fludi restrictions once on full diet. Will restart auryxia once pt is eating again.  Anice Paganini, PA-C 08/01/2019, 2:36 PM  [facility] Pager: [phone]  Nephrology attending: Patient was seen and examined at bedside.  Chart and lab results reviewed.  I agree with the consult note including assessment and plan as outlined above.  Patient admitted with hematemesis/GI bleed status post endoscopy with no active bleeding however evidence of previous bleed.  Drop in hemoglobin noted.  Hemodynamically stable. Potassium level mildly elevated therefore ordered a dose of Lokelma. We will arrange for dialysis tomorrow with 2K bath.  No heparin.  Katheran James, MD [facility].

## 2019-08-01 NOTE — ED Provider Notes (Signed)
Emergency Department Provider Note   I have reviewed the triage vital signs and the nursing notes.   HISTORY  Chief Complaint Emesis and Diarrhea   HPI Karen Morrow is a 59 y.o. female with multiple medical problems documented below who presents the emerge department today secondary to hematemesis that has now trended dark stools.  States she has a history of GI bleeds in the past similar situation.  Been going on for approximately 1 to 2 days and worsening.  Now she is feeling a bit lightheaded weak so she presents here for further evaluation.   No other associated or modifying symptoms.    Past Medical History:  Diagnosis Date  . Anginal pain ([place])   . Asthma   . CHF (congestive heart failure) ([place])   . Chronic kidney disease    dialysis 3x wk  . Dyspnea   . Frequent bowel movements   . GERD (gastroesophageal reflux disease)   . Hepatitis C antibody test positive   . Hypertension    "just dx'd today" (11/03/2012)    Patient Active Problem List   Diagnosis Date Noted  . Hypertensive cardiomyopathy, without heart failure ([place]) 03/07/2019  . Fluid overload 11/27/2018  . Acute on chronic diastolic CHF (congestive heart failure) ([place]) 11/27/2018  . Acute respiratory failure with hypoxia ([place]) 11/27/2018  . Leukocytosis 11/27/2018  . Volume overload 11/27/2018  . Hiatal hernia   . Gastrointestinal bleeding 09/29/2018  . GI bleed 09/29/2018  . Chronic hepatitis C ([place]) 09/23/2018  . Symptomatic anemia 09/16/2018  . Severe systemic inflammatory response syndrome (SIRS) ([place]) 09/16/2018  . Altered mental status 09/16/2018  . Tobacco abuse 09/16/2018  . Chronic diastolic (congestive) heart failure ([place]) 07/07/2018  . Aortic insufficiency 07/07/2018  . Hypertensive urgency 05/13/2018  . Infection of arteriovenous fistula ([place]) 04/25/2018  . MSSA bacteremia 04/25/2018  . Bacteremia   . Staphylococcal sepsis ([place])   . Sepsis ([place]) 03/10/2018  . Hyperkalemia   .  Acute respiratory distress   . Pulmonary hypertension due to left ventricular diastolic dysfunction ([place]) 11/08/2016  . Chronic cough 10/15/2016  . Acute GI bleeding 09/07/2016  . Gastroesophageal reflux disease 04/16/2016  . Anemia in chronic kidney disease, on chronic dialysis ([place]) 04/16/2016  . Primary insomnia 04/16/2016  . Shortness of breath 04/16/2016  . Pruritus 12/11/2014  . Hordeolum externum (stye) 12/11/2014  . Xanthelasma of right upper eyelid 12/11/2014  . Dermatitis 10/30/2014  . ESRD on dialysis ([place]) 02/25/2014  . Malnutrition of moderate degree ([place]) 11/28/2013  . HTN (hypertension) 11/26/2013  . Gastric ulcer 06/07/2013  . Abdominal pain 11/03/2012  . Chest pain 11/03/2012  . Thrombocytopenia ([place]) 11/03/2012  . Elevated transaminase level 11/03/2012  . Hepatic cirrhosis ([place]) 11/03/2012  . Hepatitis C antibody test positive     Past Surgical History:  Procedure Laterality Date  . ABDOMINAL HYSTERECTOMY     "partial" (11/03/2012)  . AV FISTULA PLACEMENT Right 12/03/2013   Procedure: CREATION OF ARTERIOVENOUS (AV) FISTULA RIGHT ARM ;  Surgeon: Rosetta Posner, MD;  Location: [place];  Service: Vascular;  Laterality: Right;  . AV FISTULA PLACEMENT Left 05/17/2018   Procedure: CREATION OF BRACHIOCEPHALIC FISTULA LEFT ARM;  Surgeon: Waynetta Sandy, MD;  Location: [facility];  Service: Vascular;  Laterality: Left;  . [place] REMOVAL Right 03/16/2018   Procedure: REMOVAL OF ARTERIOVENOUS GORETEX GRAFT ([place]) RIGHT ARM;  Surgeon: Waynetta Sandy, MD;  Location: [facility];  Service: Vascular;  Laterality: Right;  . BASCILIC  VEIN TRANSPOSITION Right 03/06/2014   Procedure: SECOND STAGE BASILIC VEIN TRANSPOSITION;  Surgeon: Rosetta Posner, MD;  Location: [place];  Service: Vascular;  Laterality: Right;  . BIOPSY  09/30/2018   Procedure: BIOPSY;  Surgeon: Mauri Pole, MD;  Location[facility] END[facility]COPY;  Service: Endoscopy;;  . ENTEROSCOPY N/A 10/06/2018   Procedure:  ENTEROSCOPY;  Surgeon: Lavena Bullion, DO;  Location: [place];  Service: Gastroenterology;  Laterality: N/A;  . ESOPHAGOGASTRODUODENOSCOPY N/A 06/05/2013   Procedure: ESOPHAGOGASTRODUODENOSCOPY (EGD);  Surgeon: Winfield Cunas., MD;  Location: [facility];  Service: Endoscopy;  Laterality: N/A;  . ESOPHAGOGASTRODUODENOSCOPY N/A 09/08/2016   Procedure: ESOPHAGOGASTRODUODENOSCOPY (EGD);  Surgeon: Milus Banister, MD;  Location: [place];  Service: Endoscopy;  Laterality: N/A;  . ESOPHAGOGASTRODUODENOSCOPY (EGD) WITH PROPOFOL N/A 09/17/2018   Procedure: ESOPHAGOGASTRODUODENOSCOPY (EGD) WITH PROPOFOL;  Surgeon: Carol Ada, MD;  Location: [facility];  Service: Endoscopy;  Laterality: N/A;  . ESOPHAGOGASTRODUODENOSCOPY (EGD) WITH PROPOFOL N/A 09/30/2018   Procedure: ESOPHAGOGASTRODUODENOSCOPY (EGD) WITH PROPOFOL;  Surgeon: Mauri Pole, MD;  Location: [facility]OSCOPY;  Service: Endoscopy;  Laterality: N/A;  . INSERTION OF DIALYSIS CATHETER Left 03/16/2018   Procedure: PLACEMENT OF TUNNEL DIALYSIS CATHETER;  Surgeon: Waynetta Sandy, MD;  Location: [place];  Service: Vascular;  Laterality: Left;  . INSERTION OF DIALYSIS CATHETER Right 09/26/2018   Procedure: INSERTION OF DIALYSIS  TUNNELED CATHETER;  Surgeon: Elam Dutch, MD;  Location[facility] OR;  Service: Vascular;  Laterality: Right;  . IR REMOVAL TUN CV CATH W/O FL  09/17/2018  . REVISON OF ARTERIOVENOUS FISTULA Right 01/28/2017   Procedure: REVISON OF RIGHT ARM ARTERIOVENOUS FISTULA USING 8MMX10CM GORETEX GRAFT;  Surgeon: Angelia Mould, MD;  Location: [place];  Service: Vascular;  Laterality: Right;    Current Outpatient Rx  . Order #: [id number] Class: Historical Med  . Order #: [id number] Class: Normal  . Order #: [id number] Class: Normal  . Order #: [id number] Class: Print  . Order #: [id number] Class: Historical Med  . Order #: [id number] Class: Normal  . Order #: [id number] Class: Historical Med  . Order #:  [id number] Class: Historical Med  . Order #: [id number] Class: Historical Med  . Order #: [id number] Class: Historical Med  . Order #: [id number] Class: Normal    Allergies Cefazolin, Cephalosporins, and Tuberculin  Family History  Problem Relation Age of Onset  . Lupus Mother   . Diabetes Father   . Heart attack Father 67    Social History Social History   Tobacco Use  . Smoking status: Light Tobacco Smoker    Packs/day: 0.50    Years: 20.00    Pack years: 10.00    Types: Cigarettes  . Smokeless tobacco: Never Used  Substance Use Topics  . Alcohol use: Not Currently    Alcohol/week: 1.0 standard drinks    Types: 1 Glasses of wine per week  . Drug use: Not Currently    Types: Marijuana    Review of Systems  All other systems negative except as documented in the HPI. All pertinent positives and negatives as reviewed in the HPI. ____________________________________________   PHYSICAL EXAM:  VITAL SIGNS: Vitals:   08/01/19 0600 08/01/19 0603 08/01/19 0654 08/01/19 0658  BP: (!) 80/69     Pulse:    (!) 123  Resp: 18     Temp:   (!) 97.2 F (36.2 C)   TempSrc:   Rectal   SpO2: 97% 96%      Constitutional: Alert and oriented. Well appearing and in no acute distress.  Eyes: Conjunctivae are normal. PERRL. EOMI. Head: Atraumatic. Nose: No congestion/rhinnorhea. Mouth/Throat: Mucous membranes are dry.  Oropharynx non-erythematous. Neck: No stridor.  No meningeal signs.   Cardiovascular: tachycardic rate, regular rhythm. Good peripheral circulation. Grossly normal heart sounds.   Respiratory: Normal respiratory effort.  No retractions. Lungs CTAB. Gastrointestinal: Soft and nontender. No distention.  Musculoskeletal: No lower extremity tenderness nor edema. No gross deformities of extremities. Neurologic:  Normal speech and language. No gross focal neurologic deficits are appreciated.  Skin:  Skin is warm, dry and intact. No rash noted.   ____________________________________________   LABS (all labs ordered are listed, but only abnormal results are displayed)  Labs Reviewed  CBC WITH DIFFERENTIAL/PLATELET  COMPREHENSIVE METABOLIC PANEL  LACTIC ACID, PLASMA  LACTIC ACID, PLASMA  POC OCCULT BLOOD, ED  TYPE AND SCREEN  PREPARE RBC (CROSSMATCH)   ____________________________________________  EKG   EKG Interpretation  Date/Time:    Ventricular Rate:    PR Interval:    QRS Duration:   QT Interval:    QTC Calculation:   R Axis:     Text Interpretation:         ____________________________________________  RADIOLOGY  No results found.  ____________________________________________   PROCEDURES  Procedure(s) performed:   .Critical Care Performed by: Merrily Pew, MD Authorized by: Merrily Pew, MD   Critical care provider statement:    Critical care time (minutes):  45   Critical care was necessary to treat or prevent imminent or life-threatening deterioration of the following conditions:  Shock   Critical care was time spent personally by me on the following activities:  Discussions with consultants, evaluation of patient's response to treatment, examination of patient, ordering and performing treatments and interventions, ordering and review of laboratory studies, ordering and review of radiographic studies, pulse oximetry, re-evaluation of patient's condition, obtaining history from patient or surrogate and review of old charts Angiocath insertion  Date/Time: 08/01/2019 7:02 AM Performed by: Merrily Pew, MD Authorized by: Merrily Pew, MD  Consent: Verbal consent obtained. Risks and benefits: risks, benefits and alternatives were discussed Consent given by: patient Patient understanding: patient states understanding of the procedure being performed Patient consent: the patient's understanding of the procedure matches consent given Procedure consent: procedure consent matches procedure  scheduled Relevant documents: relevant documents present and verified Required items: required blood products, implants, devices, and special equipment available Patient identity confirmed: verbally with patient Preparation: Patient was prepped and draped in the usual sterile fashion. Local anesthesia used: no  Anesthesia: Local anesthesia used: no  Sedation: Patient sedated: no  Patient tolerance: patient tolerated the procedure well with no immediate complications   ____________________________________________   INITIAL IMPRESSION / ASSESSMENT AND PLAN / ED COURSE  Likely upper gi bleed. Hypotensive, will get IV and give emergent blood and reassess.   Iv placed by myself with good flow, minimal pain.   Care transferred pending labs and likely admission.  Pertinent labs & imaging results that were available during my care of the patient were reviewed by me and considered in my medical decision making (see chart for details).  ____________________________________________  FINAL CLINICAL IMPRESSION(S) / ED DIAGNOSES  Final diagnoses:  Shock ([place])  Gastrointestinal hemorrhage, unspecified gastrointestinal hemorrhage type    MEDICATIONS GIVEN DURING THIS VISIT:  Medications  0.9 %  sodium chloride infusion (Manually program via Guardrails IV Fluids) (has no administration in time range)  lactated ringers bolus 500 mL (500 mLs Intravenous New Bag/Given 08/01/19 0633)    NEW OUTPATIENT MEDICATIONS STARTED DURING  THIS VISIT:  New Prescriptions   No medications on file    Note:  This note was prepared with assistance of Dragon voice recognition software. Occasional wrong-word or sound-a-like substitutions may have occurred due to the inherent limitations of voice recognition software.   Conswella Bruney, Corene Cornea, MD 08/01/19 [phone]

## 2019-08-02 ENCOUNTER — Encounter (HOSPITAL_COMMUNITY): Payer: Self-pay | Admitting: Internal Medicine

## 2019-08-02 DIAGNOSIS — K2971 Gastritis, unspecified, with bleeding: Secondary | ICD-10-CM | POA: Diagnosis present

## 2019-08-02 DIAGNOSIS — B182 Chronic viral hepatitis C: Secondary | ICD-10-CM

## 2019-08-02 DIAGNOSIS — K254 Chronic or unspecified gastric ulcer with hemorrhage: Secondary | ICD-10-CM | POA: Diagnosis present

## 2019-08-02 DIAGNOSIS — K920 Hematemesis: Secondary | ICD-10-CM | POA: Diagnosis present

## 2019-08-02 DIAGNOSIS — D631 Anemia in chronic kidney disease: Secondary | ICD-10-CM

## 2019-08-02 DIAGNOSIS — I509 Heart failure, unspecified: Secondary | ICD-10-CM | POA: Diagnosis not present

## 2019-08-02 DIAGNOSIS — R079 Chest pain, unspecified: Secondary | ICD-10-CM | POA: Diagnosis not present

## 2019-08-02 DIAGNOSIS — D62 Acute posthemorrhagic anemia: Secondary | ICD-10-CM | POA: Diagnosis present

## 2019-08-02 DIAGNOSIS — I352 Nonrheumatic aortic (valve) stenosis with insufficiency: Secondary | ICD-10-CM | POA: Diagnosis present

## 2019-08-02 DIAGNOSIS — Z20822 Contact with and (suspected) exposure to covid-19: Secondary | ICD-10-CM | POA: Diagnosis present

## 2019-08-02 DIAGNOSIS — R571 Hypovolemic shock: Secondary | ICD-10-CM | POA: Diagnosis present

## 2019-08-02 DIAGNOSIS — K922 Gastrointestinal hemorrhage, unspecified: Secondary | ICD-10-CM | POA: Diagnosis not present

## 2019-08-02 DIAGNOSIS — F17210 Nicotine dependence, cigarettes, uncomplicated: Secondary | ICD-10-CM | POA: Diagnosis present

## 2019-08-02 DIAGNOSIS — K222 Esophageal obstruction: Secondary | ICD-10-CM | POA: Diagnosis present

## 2019-08-02 DIAGNOSIS — I340 Nonrheumatic mitral (valve) insufficiency: Secondary | ICD-10-CM | POA: Diagnosis not present

## 2019-08-02 DIAGNOSIS — E875 Hyperkalemia: Secondary | ICD-10-CM

## 2019-08-02 DIAGNOSIS — R778 Other specified abnormalities of plasma proteins: Secondary | ICD-10-CM | POA: Diagnosis not present

## 2019-08-02 DIAGNOSIS — I132 Hypertensive heart and chronic kidney disease with heart failure and with stage 5 chronic kidney disease, or end stage renal disease: Secondary | ICD-10-CM | POA: Diagnosis present

## 2019-08-02 DIAGNOSIS — Z681 Body mass index (BMI) 19 or less, adult: Secondary | ICD-10-CM | POA: Diagnosis not present

## 2019-08-02 DIAGNOSIS — I351 Nonrheumatic aortic (valve) insufficiency: Secondary | ICD-10-CM | POA: Diagnosis not present

## 2019-08-02 DIAGNOSIS — N186 End stage renal disease: Secondary | ICD-10-CM

## 2019-08-02 DIAGNOSIS — I471 Supraventricular tachycardia: Secondary | ICD-10-CM | POA: Diagnosis not present

## 2019-08-02 DIAGNOSIS — K760 Fatty (change of) liver, not elsewhere classified: Secondary | ICD-10-CM | POA: Diagnosis not present

## 2019-08-02 DIAGNOSIS — N2581 Secondary hyperparathyroidism of renal origin: Secondary | ICD-10-CM | POA: Diagnosis present

## 2019-08-02 DIAGNOSIS — I248 Other forms of acute ischemic heart disease: Secondary | ICD-10-CM | POA: Diagnosis not present

## 2019-08-02 DIAGNOSIS — I10 Essential (primary) hypertension: Secondary | ICD-10-CM

## 2019-08-02 DIAGNOSIS — K449 Diaphragmatic hernia without obstruction or gangrene: Secondary | ICD-10-CM | POA: Diagnosis present

## 2019-08-02 DIAGNOSIS — I4891 Unspecified atrial fibrillation: Secondary | ICD-10-CM | POA: Diagnosis present

## 2019-08-02 DIAGNOSIS — R54 Age-related physical debility: Secondary | ICD-10-CM | POA: Diagnosis present

## 2019-08-02 DIAGNOSIS — K921 Melena: Secondary | ICD-10-CM | POA: Diagnosis present

## 2019-08-02 DIAGNOSIS — N250 Renal osteodystrophy: Secondary | ICD-10-CM | POA: Diagnosis not present

## 2019-08-02 DIAGNOSIS — Z992 Dependence on renal dialysis: Secondary | ICD-10-CM | POA: Diagnosis not present

## 2019-08-02 DIAGNOSIS — I5032 Chronic diastolic (congestive) heart failure: Secondary | ICD-10-CM

## 2019-08-02 DIAGNOSIS — I480 Paroxysmal atrial fibrillation: Secondary | ICD-10-CM | POA: Diagnosis not present

## 2019-08-02 DIAGNOSIS — R578 Other shock: Secondary | ICD-10-CM | POA: Diagnosis not present

## 2019-08-02 LAB — BASIC METABOLIC PANEL
Anion gap: 17 — ABNORMAL HIGH (ref 5–15)
BUN: 78 mg/dL — ABNORMAL HIGH (ref 6–20)
CO2: 22 mmol/L (ref 22–32)
Calcium: 9.3 mg/dL (ref 8.9–10.3)
Chloride: 98 mmol/L (ref 98–111)
Creatinine, Ser: 8.77 mg/dL — ABNORMAL HIGH (ref 0.44–1.00)
GFR calc Af Amer: 5 mL/min — ABNORMAL LOW (ref >60)
GFR calc non Af Amer: 4 mL/min — ABNORMAL LOW (ref >60)
Glucose, Bld: 75 mg/dL (ref 70–99)
Potassium: 4.5 mmol/L (ref 3.5–5.1)
Sodium: 137 mmol/L (ref 135–145)

## 2019-08-02 LAB — TROPONIN I (HIGH SENSITIVITY)
Troponin I (High Sensitivity): 917 ng/L (ref <18)
Troponin I (High Sensitivity): 1251 ng/L (ref <18)

## 2019-08-02 LAB — CBC
HCT: 23.5 % — ABNORMAL LOW (ref 36.0–46.0)
HCT: 19 % — ABNORMAL LOW (ref 36.0–46.0)
Hemoglobin: 8 g/dL — ABNORMAL LOW (ref 12.0–15.0)
Hemoglobin: 6.5 g/dL — CL (ref 12.0–15.0)
MCH: 29.3 pg (ref 26.0–34.0)
MCH: 29.3 pg (ref 26.0–34.0)
MCHC: 34 g/dL (ref 30.0–36.0)
MCHC: 34.2 g/dL (ref 30.0–36.0)
MCV: 86.1 fL (ref 80.0–100.0)
MCV: 85.6 fL (ref 80.0–100.0)
Platelets: 230 10*3/uL (ref 150–400)
Platelets: 184 10*3/uL (ref 150–400)
RBC: 2.73 MIL/uL — ABNORMAL LOW (ref 3.87–5.11)
RBC: 2.22 MIL/uL — ABNORMAL LOW (ref 3.87–5.11)
RDW: 13.2 % (ref 11.5–15.5)
RDW: 13.2 % (ref 11.5–15.5)
WBC: 7.8 10*3/uL (ref 4.0–10.5)
WBC: 6.6 10*3/uL (ref 4.0–10.5)
nRBC: 0 % (ref 0.0–0.2)
nRBC: 0 % (ref 0.0–0.2)

## 2019-08-02 LAB — MAGNESIUM: Magnesium: 1.6 mg/dL — ABNORMAL LOW (ref 1.7–2.4)

## 2019-08-02 LAB — PREPARE RBC (CROSSMATCH)

## 2019-08-02 LAB — SURGICAL PATHOLOGY

## 2019-08-02 LAB — TSH: TSH: 1.235 u[IU]/mL (ref 0.350–4.500)

## 2019-08-02 MED ORDER — MAGNESIUM SULFATE 2 GM/50ML IV SOLN
2.0000 g | Freq: Once | INTRAVENOUS | Status: AC
  Administered 2019-08-03: 2 g via INTRAVENOUS
  Filled 2019-08-02: qty 50

## 2019-08-02 MED ORDER — DARBEPOETIN ALFA 40 MCG/0.4ML IJ SOSY
PREFILLED_SYRINGE | INTRAMUSCULAR | Status: AC
  Filled 2019-08-02: qty 0.4

## 2019-08-02 MED ORDER — DARBEPOETIN ALFA 40 MCG/0.4ML IJ SOSY
40.0000 ug | PREFILLED_SYRINGE | INTRAMUSCULAR | Status: DC
  Administered 2019-08-02: 40 ug via INTRAVENOUS
  Filled 2019-08-02: qty 0.4

## 2019-08-02 MED ORDER — SODIUM CHLORIDE 0.9% IV SOLUTION: Freq: Once | INTRAVENOUS | Status: DC

## 2019-08-02 NOTE — Progress Notes (Addendum)
[facility] Progress Note   Subjective:   Patient seen in room. Reports a large amount of black stool overnight. Feeling weak. Denies CP, palpitations, dizziness, SOB, cough, abdominal pain, N/V.  Objective Vitals:   08/01/19 1400 08/01/19 1700 08/01/19 2048 08/02/19 0500  BP: (!) 92/40 (!) 98/53 (!) 125/92 97/61  Pulse:  (!) 104 (!) 103 85  Resp: (!) 23 20 17 15   Temp:  98 F (36.7 C) 98.5 F (36.9 C) 98.2 F (36.8 C)  TempSrc:  Oral Oral Oral  SpO2: 100% 99% 100% 100%  Weight:    50.7 kg  Height:       Physical Exam General: Well developed, thin female. Alert and in NAD Heart: RRR, no murmurs, rubs or gallops Lungs: CTA bilaterally without wheezing,rhonchi or rales Abdomen: Soft, non-tender, non-distended. +BS Extremities: No edema b/l lower extremities Dialysis Access: LUE AVF + bruit  Additional Objective Labs: Basic Metabolic Panel: Recent Labs  Lab 08/01/19 0655 08/02/19 0429  NA 138 137  K 5.6* 4.5  CL 95* 98  CO2 24 22  GLUCOSE 95 75  BUN 59* 78*  CREATININE 7.49* 8.77*  CALCIUM 9.2 9.3   Liver Function Tests: Recent Labs  Lab 08/01/19 0655  AST 18  ALT 10  ALKPHOS 59  BILITOT 0.9  PROT 5.8*  ALBUMIN 2.5*   CBC: Recent Labs  Lab 08/01/19 0655 08/01/19 1931 08/02/19 0429  WBC 9.1 9.0 7.8  NEUTROABS 7.1  --   --   HGB 9.1* 7.8* 8.0*  HCT 27.8* 22.8* 23.5*  MCV 89.7 85.1 86.1  PLT 275 228 230   Blood Culture    Component Value Date/Time   SDES BLOOD RIGHT HAND 09/16/2018 0252   SPECREQUEST  09/16/2018 0252    BOTTLES DRAWN AEROBIC ONLY Blood Culture adequate volume   CULT  09/16/2018 0252    NO GROWTH 5 DAYS Performed at [facility], [place] [address], [place], [place] [address]    REPTSTATUS 09/21/2018 FINAL 09/16/2018 0252    Medications: . pantoprozole (PROTONIX) infusion 8 mg/hr (08/02/19 0458)   . sodium chloride   Intravenous Once  . Chlorhexidine Gluconate Cloth  6 each Topical Q0600  . cinacalcet   30 mg Oral Q supper  . ferric citrate  420 mg Oral TID WC  . gabapentin  100 mg Oral QHS  . [START ON 08/04/2019] pantoprazole  40 mg Intravenous Q12H  . sodium chloride flush  3 mL Intravenous Q12H  . sucralfate  1 g Oral TID WC & HS    Dialysis Orders:  Center: [facility]  on TTS . 180NRe, 3:45hr, BFR 350/ DFR 600, EDW 51kg, 2K/2.5Ca, 16g needles hectorol 3 mcg IV q HD Lokelma 10g on non-HD days Auryxia 2 tabs TID with meals (pt reports taking "blue and white pill" which is likely calcium acetate)  Assessment/Plan: 1.  Acute blood loss anemia 2/2 GI bleed: Patient reported 2 days and coffee ground emesis. Hgb 9.1 on admission compared to baseline 13-14. Given 1 unit PRBC and taken for EGD which was suggestive of hemorrhagic gastritis and gastric ulcer. Post transfusion Hgb 7.8 > 8.0, had more black watery stools overnight. Per primary/GI. Will start aranesp with HD.  2.  ESRD:  TTS schedule- next HD today then 3/6 3. Hyperkalemia: K+ 5.6 on presentation. Given dose of lokelma, K+ 4.5 this AM. 4.  Hypertension/volume: BP soft, not on any BP meds. Above her EDW but is euvolemic on exam. Minimal  UF with HD tomorrow.  5.  Metabolic bone disease: Corrected calcium 10.4- likely because patient has been taking calcium acetate rather than [place]. D/c calcium acetate and resume [place] once eating. Will hold hectorol for now. 6.  Nutrition:  On clear liquids, renal diet with fluid restrictions once on full diet. Will restart auryxia once pt is eating again.  Anice Paganini, PA-C 08/02/2019, 8:51 AM  [facility] Pager: [phone]  Nephrology attending: Patient was seen and examined.  Chart and lab results reviewed.  I agree with assessment and plan as outlined above.  Admitted with GI bleed status post endoscopy with no active bleed however has evidence of previous bleeding.  Received blood transfusion.  Plan for HD today with 2K bath. Okay to discharge  today after dialysis from renal perspective.  Katheran James, MD [facility].

## 2019-08-02 NOTE — Progress Notes (Signed)
PROGRESS NOTE    Karen Morrow  OZH:[id number] DOB: Feb 06, 1961 DOA: 08/01/2019 PCP: [name], MD   Brief Narrative:  HPI On 08/01/2019 by Dr. Karmen Bongo Karen Morrow is a 59 y.o. female with medical history significant of HTN; Hep C; ESRD on TTS HD; afib; and CHF presenting with n/v/d with hematemesis.  She reports vomiting blood x 2 days.  Also with diarrhea with blood.  She is getting increasingly weak, can't even walk.  She thinks this also happened to her last year in May.  She doesn't remember anything about that admission.  She denies abdominal pain.  She is no longer having hematemesis but it was black; also with black stools.  She is not on blood thinners, "I am on nothing."  No fever.  She was able to complete her entire session yesterday, but she had fecal incontinence there with dark stools.  HD did not send her to the hospital, she came in on her own.  Interim history Admitted with GI bleed, s/p EGD and blood transfusion. GI wants to watch for additional night to trend hemoglobin given dark stools overnight. Assessment & Plan   Upper GI bleed/symptomatic anemia/acute on chronic Anemia -Patient presented with lightheadedness and fatigue -Gastroenterology consulted and appreciated -Patient has had multiple EGDs -EGD on 08/01/2019: Patient noted to have no active bleeding or fresh blood in stomach, coffee-ground noted suggestive of hemorrhagic gastritis.  Moderately large, cratered gastric ulcer with a slightly dirty base but no visible vessel or adherent clot.  Small gastric ulcer with clean base and a great deal of prepyloric mucosal edema and prominent folds. -Hemoglobin currently 8 -Discussed with gastroenterology, Dr. Cristina Gong, would like to continue to monitor hemoglobin overnight given continued dark stools and EGD findings.  ESRD with mild hyperkalemia -Patient dialyzes on Tuesday, Thursday, Saturday -Nephrology consulted and appreciated -Continue PhosLo,  Sensipar, Nephro-Vite  Chronic hepatitis C -Patient with elevated quant and in April 2020 -Does not appear to have outpatient follow-up, hopefully will continue to follow-up with gastroenterology  Essential hypertension -BPs are currently soft, likely associated with anemia and GI bleeding -BP medications, amlodipine and labetalol currently held -Continue to monitor closely  DVT Prophylaxis  SCDs  Code Status: Full  Family Communication: None at bedside  Disposition Plan: Admitted from home for anemia and GI bleed. Will continue to monitor hemoglobin for additional day given continued dark stools over night and EGD findings. Home when stable.   Consultants Gastroenterology  Nephrology  Procedures  EGD  Antibiotics   Anti-infectives (From admission, onward)   None      Subjective:   [name] seen and examined today.  Patient wanting to eat this morning.  Denies any bright red blood per rectum but does states she had dark stools overnight.  Denies current chest pain or shortness of breath, nausea or vomiting.  Continues to have some lightheadedness.  Objective:   Vitals:   08/02/19 1145 08/02/19 1215 08/02/19 1245 08/02/19 1300  BP: 114/89 (!) 89/50 (!) 87/40 (!) 94/37  Pulse: 77 (!) 128 95 81  Resp: 18 18 19 18   Temp:      TempSrc:      SpO2:   100% 100%  Weight:      Height:        Intake/Output Summary (Last 24 hours) at 08/02/2019 1332 Last data filed at 08/02/2019 0600 Gross per 24 hour  Intake 989.27 ml  Output 200 ml  Net 789.27 ml   [employer]  08/01/19 1140 08/02/19 0500 08/02/19 1055  Weight: 54.4 kg 50.7 kg 50.7 kg    Exam  General: Well developed, well nourished, NAD, appears stated age  50: NCAT, mucous membranes moist.   Cardiovascular: S1 S2 auscultated, 3/6SEM, RRR  Respiratory: Clear to auscultation bilaterally   Abdomen: Soft, nontender, nondistended, + bowel sounds  Extremities: warm dry without cyanosis clubbing or  edema  Neuro: AAOx3, nonfocal  Psych: Appropriate mood and affect   Data Reviewed: I have personally reviewed following labs and imaging studies  CBC: Recent Labs  Lab 08/01/19 0655 08/01/19 1931 08/02/19 0429  WBC 9.1 9.0 7.8  NEUTROABS 7.1  --   --   HGB 9.1* 7.8* 8.0*  HCT 27.8* 22.8* 23.5*  MCV 89.7 85.1 86.1  PLT 275 228 938   Basic Metabolic Panel: Recent Labs  Lab 08/01/19 0655 08/02/19 0429  NA 138 137  K 5.6* 4.5  CL 95* 98  CO2 24 22  GLUCOSE 95 75  BUN 59* 78*  CREATININE 7.49* 8.77*  CALCIUM 9.2 9.3   GFR: Estimated Creatinine Clearance: 5.5 mL/min (A) (by C-G formula based on SCr of 8.77 mg/dL (H)). Liver Function Tests: Recent Labs  Lab 08/01/19 0655  AST 18  ALT 10  ALKPHOS 59  BILITOT 0.9  PROT 5.8*  ALBUMIN 2.5*   No results for input(s): LIPASE, AMYLASE in the last 168 hours. No results for input(s): AMMONIA in the last 168 hours. Coagulation Profile: Recent Labs  Lab 08/01/19 0701  INR 1.3*   Cardiac Enzymes: No results for input(s): CKTOTAL, CKMB, CKMBINDEX, TROPONINI in the last 168 hours. BNP (last 3 results) No results for input(s): PROBNP in the last 8760 hours. HbA1C: No results for input(s): HGBA1C in the last 72 hours. CBG: No results for input(s): GLUCAP in the last 168 hours. Lipid Profile: No results for input(s): CHOL, HDL, LDLCALC, TRIG, CHOLHDL, LDLDIRECT in the last 72 hours. Thyroid Function Tests: No results for input(s): TSH, T4TOTAL, FREET4, T3FREE, THYROIDAB in the last 72 hours. Anemia Panel: No results for input(s): VITAMINB12, FOLATE, FERRITIN, TIBC, IRON, RETICCTPCT in the last 72 hours. Urine analysis:    Component Value Date/Time   COLORURINE YELLOW 11/26/2013 1819   APPEARANCEUR TURBID (A) 11/26/2013 1819   LABSPEC 1.021 11/26/2013 1819   PHURINE 5.5 11/26/2013 1819   GLUCOSEU NEGATIVE 11/26/2013 1819   HGBUR SMALL (A) 11/26/2013 1819   BILIRUBINUR NEGATIVE 11/26/2013 1819   KETONESUR  NEGATIVE 11/26/2013 1819   PROTEINUR >300 (A) 11/26/2013 1819   UROBILINOGEN 0.2 11/26/2013 1819   NITRITE POSITIVE (A) 11/26/2013 1819   LEUKOCYTESUR MODERATE (A) 11/26/2013 1819   Sepsis Labs: @LABRCNTIP (procalcitonin:4,lacticidven:4)  ) Recent Results (from the past 240 hour(s))  Respiratory Panel by RT PCR (Flu A&B, Covid) - Nasopharyngeal Swab     Status: None   Collection Time: 08/01/19  9:03 AM   Specimen: Nasopharyngeal Swab  Result Value Ref Range Status   SARS Coronavirus 2 by RT PCR NEGATIVE NEGATIVE Final    Comment: (NOTE) SARS-CoV-2 target nucleic acids are NOT DETECTED. The SARS-CoV-2 RNA is generally detectable in upper respiratoy specimens during the acute phase of infection. The lowest concentration of SARS-CoV-2 viral copies this assay can detect is 131 copies/mL. A negative result does not preclude SARS-Cov-2 infection and should not be used as the sole basis for treatment or other patient management decisions. A negative result may occur with  improper specimen collection/handling, submission of specimen other than nasopharyngeal swab, presence of viral mutation(s)  within the areas targeted by this assay, and inadequate number of viral copies (<131 copies/mL). A negative result must be combined with clinical observations, patient history, and epidemiological information. The expected result is Negative. Fact Sheet for Patients:  PinkCheek.be Fact Sheet for Healthcare Providers:  GravelBags.it This test is not yet ap proved or cleared by the [place] FDA and  has been authorized for detection and/or diagnosis of SARS-CoV-2 by FDA under an Emergency Use Authorization (EUA). This EUA will remain  in effect (meaning this test can be used) for the duration of the COVID-19 declaration under Section 564(b)(1) of the Act, 21 U.S.C. section 360bbb-3(b)(1), unless the authorization is terminated  or revoked sooner.    Influenza A by PCR NEGATIVE NEGATIVE Final   Influenza B by PCR NEGATIVE NEGATIVE Final    Comment: (NOTE) The Xpert Xpress SARS-CoV-2/FLU/RSV assay is intended as an aid in  the diagnosis of influenza from Nasopharyngeal swab specimens and  should not be used as a sole basis for treatment. Nasal washings and  aspirates are unacceptable for Xpert Xpress SARS-CoV-2/FLU/RSV  testing. Fact Sheet for Patients: PinkCheek.be Fact Sheet for Healthcare Providers: GravelBags.it This test is not yet approved or cleared by the [place] FDA and  has been authorized for detection and/or diagnosis of SARS-CoV-2 by  FDA under an Emergency Use Authorization (EUA). This EUA will remain  in effect (meaning this test can be used) for the duration of the  Covid-19 declaration under Section 564(b)(1) of the Act, 21  U.S.C. section 360bbb-3(b)(1), unless the authorization is  terminated or revoked. Performed at [facility], [place] [address] [place], [place] [address]       Radiology Studies: No results found.   Scheduled Meds: . sodium chloride   Intravenous Once  . Chlorhexidine Gluconate Cloth  6 each Topical Q0600  . cinacalcet  30 mg Oral Q supper  . Darbepoetin Alfa      . darbepoetin (ARANESP) injection - DIALYSIS  40 mcg Intravenous Q Thu-HD  . ferric citrate  420 mg Oral TID WC  . gabapentin  100 mg Oral QHS  . [START ON 08/04/2019] pantoprazole  40 mg Intravenous Q12H  . sodium chloride flush  3 mL Intravenous Q12H  . sucralfate  1 g Oral TID WC & HS   Continuous Infusions: . pantoprozole (PROTONIX) infusion 8 mg/hr (08/02/19 0458)     LOS: 0 days   Time Spent in minutes   45 minutes  Marleah Beever D.O. on 08/02/2019 at 1:32 PM  Between 7am to 7pm - Please see pager noted on [email or web address]  After 7pm go to [email or web address]  And look for the night coverage person covering for me after  hours  [facility] Office  [phone]

## 2019-08-02 NOTE — Consult Note (Addendum)
Cardiology Consultation:   Patient ID: Karen Morrow MRN: [id number]; DOB: 31-Jul-1960  Admit date: 08/01/2019 Date of Consult: 08/02/2019  Primary Care Provider: [name], MD Primary Cardiologist: Quay Burow, MD  Primary Electrophysiologist:  None    Patient Profile:   Karen Morrow is a 59 y.o. female with a hx of hypertension, chronic hep C, hypertensive cardiovascular disease, GI bleed, chronic diastolic heart failure, aortic insufficiency, end-stage renal disease on hemodialysis, tobacco abuse who is being seen today for the evaluation of chest pain at the request of Dr. Ree Kida.  History of Present Illness:   Karen Morrow is followed by Dr. Gwenlyn Found.  Prior echo 11/08/2016 have suggested bicuspid aortic valve with moderate aortic insufficiency though her most recent echo in December 2019 showed normal LV with an EF of 55 to 60%, moderate to severe LVH, grade 1 diastolic dysfunction, trileaflet aortic valve, and mild to moderate AI. In the past has reported some substernal chest pain at the termination of dialysis lasting several minutes at a time.  Patient is a current smoker.  Patient was admitted in June 2020 with heart failure after missed dialysis. The patient was seen by Kerin Ransom 03/07/2019 for a televisit.  She was stable from a cardiac standpoint.  She reported some of her pressures are high but improve after dialysis.  No changes were made.  The patient was admitted 08/01/2019 for lightheadedness and fatigue and to have upper GI bleed and hyperkalemia. Hemoglobin was 9.1 on admission, s/p1 unit PRBC. No  EKG done on admission.  GI performed endoscopy which showed no active bleeding, recommend medical management.  Patient had dialysis this morning and experienced chest pain. It started at the beginning of HD. It was sharp, substernal, nonradiating, 8/10.  No shortness of breath, nausea, vomiting. It was constant throughout HD. Not given NTG likely for soft pressures.  Pressures with systolics in the 35K throughout hemodialysis.    EKG done showed A. fib RVR heart rate 110 with LVH and repolarization abnormalities. Cardiology was consulted.   heart Pathway Score:     Past Medical History:  Diagnosis Date  . Anginal pain ([place])   . Asthma   . Atrial fibrillation ([place])    not on AC  . CHF (congestive heart failure) ([place])   . Chronic kidney disease    dialysis 3x wk  . Dyspnea   . Frequent bowel movements   . GERD (gastroesophageal reflux disease)   . Hepatitis C antibody test positive   . Hypertension    "just dx'd today" (11/03/2012)    Past Surgical History:  Procedure Laterality Date  . ABDOMINAL HYSTERECTOMY     "partial" (11/03/2012)  . AV FISTULA PLACEMENT Right 12/03/2013   Procedure: CREATION OF ARTERIOVENOUS (AV) FISTULA RIGHT ARM ;  Surgeon: Rosetta Posner, MD;  Location: [facility];  Service: Vascular;  Laterality: Right;  . AV FISTULA PLACEMENT Left 05/17/2018   Procedure: CREATION OF BRACHIOCEPHALIC FISTULA LEFT ARM;  Surgeon: Waynetta Sandy, MD;  Location: [facility];  Service: Vascular;  Laterality: Left;  . [place] REMOVAL Right 03/16/2018   Procedure: REMOVAL OF ARTERIOVENOUS GORETEX GRAFT ([place]) RIGHT ARM;  Surgeon: Waynetta Sandy, MD;  Location: [facility];  Service: Vascular;  Laterality: Right;  . BASCILIC VEIN TRANSPOSITION Right 03/06/2014   Procedure: SECOND STAGE BASILIC VEIN TRANSPOSITION;  Surgeon: Rosetta Posner, MD;  Location: [facility];  Service: Vascular;  Laterality: Right;  . BIOPSY  09/30/2018   Procedure: BIOPSY;  Surgeon:  Mauri Pole, MD;  Location: [facility];  Service: Endoscopy;;  . ENTEROSCOPY N/A 10/06/2018   Procedure: ENTEROSCOPY;  Surgeon: Lavena Bullion, DO;  Location: [facility];  Service: Gastroenterology;  Laterality: N/A;  . ESOPHAGOGASTRODUODENOSCOPY N/A 06/05/2013   Procedure: ESOPHAGOGASTRODUODENOSCOPY (EGD);  Surgeon: Winfield Cunas., MD;  Location: [facility];  Service: Endoscopy;   Laterality: N/A;  . ESOPHAGOGASTRODUODENOSCOPY N/A 09/08/2016   Procedure: ESOPHAGOGASTRODUODENOSCOPY (EGD);  Surgeon: Milus Banister, MD;  Location: [facility];  Service: Endoscopy;  Laterality: N/A;  . ESOPHAGOGASTRODUODENOSCOPY (EGD) WITH PROPOFOL N/A 09/17/2018   Procedure: ESOPHAGOGASTRODUODENOSCOPY (EGD) WITH PROPOFOL;  Surgeon: Carol Ada, MD;  Location: [facility];  Service: Endoscopy;  Laterality: N/A;  . ESOPHAGOGASTRODUODENOSCOPY (EGD) WITH PROPOFOL N/A 09/30/2018   Procedure: ESOPHAGOGASTRODUODENOSCOPY (EGD) WITH PROPOFOL;  Surgeon: Mauri Pole, MD;  Location: [facility];  Service: Endoscopy;  Laterality: N/A;  . INSERTION OF DIALYSIS CATHETER Left 03/16/2018   Procedure: PLACEMENT OF TUNNEL DIALYSIS CATHETER;  Surgeon: Waynetta Sandy, MD;  Location: [facility];  Service: Vascular;  Laterality: Left;  . INSERTION OF DIALYSIS CATHETER Right 09/26/2018   Procedure: INSERTION OF DIALYSIS  TUNNELED CATHETER;  Surgeon: Elam Dutch, MD;  Location: [facility] OR;  Service: Vascular;  Laterality: Right;  . IR REMOVAL TUN CV CATH W/O FL  09/17/2018  . REVISON OF ARTERIOVENOUS FISTULA Right 01/28/2017   Procedure: REVISON OF RIGHT ARM ARTERIOVENOUS FISTULA USING 8MMX10CM GORETEX GRAFT;  Surgeon: Angelia Mould, MD;  Location: [facility] OR;  Service: Vascular;  Laterality: Right;     Home Medications:  Prior to Admission medications   Medication Sig Start Date End Date Taking? Authorizing Provider  acetaminophen (TYLENOL) 500 MG tablet Take 500-1,000 mg by mouth every 6 (six) hours as needed for headache (pain).   Yes [provider]  albuterol (PROVENTIL HFA;VENTOLIN HFA) 108 (90 Base) MCG/ACT inhaler TAKE 2 PUFFS BY MOUTH EVERY 6 HOURS AS NEEDED FOR WHEEZE OR SHORTNESS OF BREATH Patient taking differently: Inhale 2 puffs into the lungs every 6 (six) hours as needed for wheezing or shortness of breath.  05/09/18  Yes [name], MD  amLODipine (NORVASC) 10 MG  tablet Take 1 tablet (10 mg total) by mouth daily. Patient taking differently: Take 10 mg by mouth at bedtime.  05/16/18  Yes Thurnell Lose, MD  b complex-vitamin c-folic acid (NEPHRO-VITE) 0.8 MG TABS tablet Take 1 tablet by mouth every evening. 07/27/18  Yes [provider]  calcium acetate (PHOSLO) 667 MG capsule Take 1,334 mg by mouth 3 (three) times daily.   Yes [provider]  cinacalcet (SENSIPAR) 30 MG tablet TAKE 1 TABLET BY MOUTH DAILY EVERY EVENING (DO NOT TAKE LESS THAN 12 HOURS PRIOR TO DIALYSIS) Patient taking differently: Take 30 mg by mouth daily with supper. (DO NOT TAKE LESS THAN 12 HOURS PRIOR TO DIALYSIS) 09/19/18  Yes Hosie Poisson, MD  ferric citrate (AURYXIA) 1 GM 210 MG(Fe) tablet Take 420 mg by mouth 3 (three) times daily with meals.   Yes [provider]  pantoprazole (PROTONIX) 40 MG tablet Take 1 tablet (40 mg total) by mouth 2 (two) times daily. 10/01/18  Yes Mercy Riding, MD  Amino Acids-Protein Hydrolys (FEEDING SUPPLEMENT, PRO-STAT SUGAR FREE 64,) LIQD Take 30 mLs by mouth 2 (two) times daily. Patient not taking: Reported on 08/01/2019 09/19/18   Hosie Poisson, MD  labetalol (NORMODYNE) 200 MG tablet Take 200 mg by mouth 2 (two) times daily.    [provider]    Inpatient Medications: Scheduled Meds: . sodium chloride   Intravenous Once  . Chlorhexidine Gluconate Cloth  6 each Topical Q0600  . cinacalcet  30 mg Oral Q supper  . Darbepoetin Alfa      . darbepoetin (ARANESP) injection - DIALYSIS  40 mcg Intravenous Q Thu-HD  . ferric citrate  420 mg Oral TID WC  . gabapentin  100 mg Oral QHS  . [START ON 08/04/2019] pantoprazole  40 mg Intravenous Q12H  . sodium chloride flush  3 mL Intravenous Q12H  . sucralfate  1 g Oral TID WC & HS   Continuous Infusions: . pantoprozole (PROTONIX) infusion 8 mg/hr (08/02/19 0458)   PRN Meds: acetaminophen **OR** acetaminophen, albuterol, calcium carbonate (dosed in mg elemental calcium),  camphor-menthol **AND** hydrOXYzine, docusate sodium, feeding supplement (NEPRO CARB STEADY), ondansetron **OR** ondansetron (ZOFRAN) IV, sorbitol, zolpidem  Allergies:    Allergies  Allergen Reactions  . Cefazolin Other (See Comments)    Severe thrombocytopenia (has tolerated zosyn in the past) ID aware Tolerated in October 2019  . Cephalosporins Anaphylaxis  . Tuberculin Swelling    Social History:   Social History   Socioeconomic History  . Marital status: Married    Spouse name: Not on file  . Number of children: Not on file  . Years of education: Not on file  . Highest education level: Not on file  Occupational History  . Occupation: disabled  Tobacco Use  . Smoking status: Current Every Day Smoker    Packs/day: 1.00    Years: 40.00    Pack years: 40.00    Types: Cigarettes  . Smokeless tobacco: Never Used  . Tobacco comment: now down to 1/2 ppd  Substance and Sexual Activity  . Alcohol use: Not Currently    Alcohol/week: 0.0 standard drinks    Comment: never a heavy drinker, hasn't drank in years  . Drug use: Not Currently    Types: Marijuana    Comment: used to smoke once a day, remote h/o MJ  . Sexual activity: Not Currently  Other Topics Concern  . Not on file  Social History Narrative   Uses [employer] or her husband provides transportation- patient does not drive   Social Determinants of Radio broadcast assistant Strain:   . Difficulty of Paying Living Expenses: Not on file  Food Insecurity: Food Insecurity Present  . Worried About Charity fundraiser in the Last Year: Sometimes true  . Ran Out of Food in the Last Year: Not on file  Transportation Needs: No Transportation Needs  . Lack of Transportation (Medical): No  . Lack of Transportation (Non-Medical): No  Physical Activity:   . Days of Exercise per Week: Not on file  . Minutes of Exercise per Session: Not on file  Stress:   . Feeling of Stress : Not on file  Social Connections:   . Frequency of  Communication with Friends and Family: Not on file  . Frequency of Social Gatherings with Friends and Family: Not on file  . Attends Religious Services: Not on file  . Active Member of Clubs or Organizations: Not on file  . Attends Archivist Meetings: Not on file  . Marital Status: Not on file  Intimate Partner Violence:   . Fear of Current or Ex-Partner: Not on file  . Emotionally Abused: Not on file  . Physically Abused: Not on file  . Sexually Abused: Not on file    Family History:  Family History  Problem Relation Age of Onset  . Lupus Mother   . Diabetes Father   . Heart attack Father 53     ROS:  Please see the history of present illness.  All other ROS reviewed and negative.     Physical Exam/Data:   Vitals:   08/02/19 1300 08/02/19 1330 08/02/19 1400 08/02/19 1430  BP: (!) 94/37 (!) 82/45 (!) 96/54 (!) 84/49  Pulse: 81 (!) 120 (!) 117 (!) 107  Resp: 18 20  19   Temp:    97.6 F (36.4 C)  TempSrc:    Oral  SpO2: 100% 100%  98%  Weight:    51 kg  Height:        Intake/Output Summary (Last 24 hours) at 08/02/2019 1551 Last data filed at 08/02/2019 1430 Gross per 24 hour  Intake 1139.27 ml  Output 111 ml  Net 1028.27 ml   Last 3 Weights 08/02/2019 08/02/2019 08/02/2019  Weight (lbs) 112 lb 7 oz 111 lb 12.4 oz 111 lb 11.2 oz  Weight (kg) 51 kg 50.7 kg 50.667 kg     Body mass index is 17.61 kg/m.  General:  Well nourished, well developed, in no acute distress HEENT: normal Lymph: no adenopathy Neck: no JVD Endocrine:  No thryomegaly Vascular: No carotid bruits; FA pulses 2+ bilaterally without bruits  Cardiac:  normal S1, S2; Irreg Irreg; + murmur  Lungs:  clear to auscultation bilaterally, no wheezing, rhonchi or rales  Abd: soft, nontender, no hepatomegaly  Ext: no edema Musculoskeletal:  No deformities, BUE and BLE strength normal and equal Skin: warm and dry  Neuro:  CNs 2-12 intact, no focal abnormalities noted Psych:  Normal affect   EKG:   The EKG was personally reviewed and demonstrates: A. fib RVR, 110 bpm, LVH with repol abnormality, TWI V4 through V6 Telemetry:  Telemetry was personally reviewed and demonstrates:  Afib, rates 100-120; PVCs  Relevant CV Studies:  Echo limited 05/14/2018 Study Conclusions   - Left ventricle: The cavity size was normal. Wall thickness was  increased in a pattern of moderate to severe LVH. Systolic  function was normal. The estimated ejection fraction was in the  range of 55% to 60%. Wall motion was normal; there were no  regional wall motion abnormalities. Doppler parameters are  consistent with abnormal left ventricular relaxation (grade 1  diastolic dysfunction).  - Aortic valve: Noncalcified annulus. Probably trileaflet; mildly  calcified leaflets. There was mild to moderate regurgitation.  - Mitral valve: Moderately calcified annulus. There was trivial  regurgitation.  - Left atrium: The atrium was moderately dilated.  - Tricuspid valve: There was trivial regurgitation.  - Pulmonary arteries: Systolic pressure could not be accurately  estimated.  - Pericardium, extracardiac: A trivial pericardial effusion was  identified posterior to the heart.   Laboratory Data:  High Sensitivity Troponin:  No results for input(s): TROPONINIHS in the last 720 hours.   Chemistry Recent Labs  Lab 08/01/19 0655 08/02/19 0429  NA 138 137  K 5.6* 4.5  CL 95* 98  CO2 24 22  GLUCOSE 95 75  BUN 59* 78*  CREATININE 7.49* 8.77*  CALCIUM 9.2 9.3  GFRNONAA 5* 4*  GFRAA 6* 5*  ANIONGAP 19* 17*    Recent Labs  Lab 08/01/19 0655  PROT 5.8*  ALBUMIN 2.5*  AST 18  ALT 10  ALKPHOS 59  BILITOT 0.9   Hematology Recent Labs  Lab 08/01/19 0655 08/01/19 1931 08/02/19 0429  WBC 9.1 9.0 7.8  RBC 3.10* 2.68* 2.73*  HGB 9.1* 7.8* 8.0*  HCT 27.8* 22.8* 23.5*  MCV 89.7 85.1 86.1  MCH 29.4 29.1 29.3  MCHC 32.7 34.2 34.0  RDW 13.6 13.1 13.2  PLT 275 228 230   BNPNo  results for input(s): BNP, PROBNP in the last 168 hours.  DDimer No results for input(s): DDIMER in the last 168 hours.   Radiology/Studies:  No results found. { HEAR Score (for undifferentiated chest pain):  HEAR Score: 4    Assessment and Plan:   Chest pain -She has history of chest pain at the termination of hemodialysis. -Pain has been constant since beginning of HD. It is sharp and nonradiating with no associated symptoms.   -EKG shows A. fib with RVR and LVH -Check serial troponins  -Prior history of CAD or MI.  Not on aspirin -Pressues are still soft. Can give morphine for ongoing chest pain.  - will check an echo  New onset atrial fibrillation with RVR -Does not appear to have previous diagnosis of A. Fib. Looking through previous EKGs many show sinus tachycardia.  - On telemetry it shows Afib rates 100-120 -CHA2DS2-VASc = 3 (HTN, female, CHF) -Given anemia will discuss with MD anticoagulation. -Check TSH -Potassium 4.5 -Check magnesium - cannot add rate lowering medication given hypotension - continue to monitor  GI bleed -Seen by GI and underwent endoscopy showed no active bleeding. It showed hemorrhagic gastritis and moderately large cratered ulcer.  Recommended PPI and sucralfate - status post 1 unit PRBC -Hemoglobin is 8.0 today -No further bleeding plan was for discharge today  Chronic diastolic heart failure  -Recent limited echo 05/14/2018 showed moderate to severe LVH with wall thickness, EF 55 to 60%, normal wall motion, grade 1 diastolic dysfunction -Volume status per HD -Continue beta-blocker when pressures can tolerate  ESRD on HD - TTSa  Mild to moderate AI -Per echo 04/2018, likely trileaflet  Hypertension -Amlodipine 10 mg daily and labetalol 200 mg twice daily at baseline>> held for hypotension  For questions or updates, please contact [facility] Please consult [email or web address] for contact info under   Signed, Cadence Ninfa Meeker, PA-C    08/02/2019 3:51 PM   History and all data above reviewed.  Patient examined.  I agree with the findings as above.  The patient has no prior history of atrial fibrillation.  She has aortic stenosis as described.  This has been followed conservatively.  She does not report that she has any particular problems.  She tells me that in between dialysis days she does well.  She and her husband take care of their small home.  She denies any chest discomfort, neck or arm discomfort.  She is vague about having palpitations occasionally.  She has not had any presyncope or syncope.  She presented with upper GI bleeding and black stools.  On dialysis she was noted to be in atrial fibrillation the rate is not poorly controlled.  She has had anemia and transfusion as above and has become progressively more hypotensive.  She is also now describing chest discomfort.  She cannot qualify this as sharp or dull but she does report that it is intense.  She is not describing associated arm discomfort.  She has some nausea.  She is not having any new shortness of breath, PND or orthopnea.  The patient exam reveals COR: Irregular, 3 out of 6 mid peaking systolic murmur radiating into the carotids, no diastolic murmurs, probable parvus at tardus,  Lungs: Mildly decreased breath  sounds bilaterally,  Abd: Positive bowel sounds, no rebound, guarding, hepatomegaly, splenomegaly., Ext 2+ upper pulses, diminished dorsalis pedis posterior tibialis bilaterally.  All available labs, radiology testing, previous records reviewed. Agree with documented assessment and plan.  Atrial fibrillation: New onset.  Rate seems to be reasonably controlled now particular given her blood pressure.  They are trying to get an accurate blood pressure reading.  For now she needs to be transfused and resuscitated from possible hypovolemic shock.  Certainly no anticoagulation.  Could consider amiodarone as needed if we had need for further rate control after she is  volume resuscitated.  Aortic stenosis: We will check a full echocardiogram.  She probably has a least moderate aortic stenosis although I do not think this is contributing significantly to the current situation.  Chest pain: This is atypical.  We will cycle enzymes though I expect him to be elevated.  We will look for any diagnostic changes.  Her EKG demonstrates chronic hypertrophy and repolarization changes.  There is no acute ST elevation.  I am not expecting this to be an acute coronary syndrome.  Jeneen Rinks Letizia Hook  5:21 PM  08/02/2019

## 2019-08-02 NOTE — Progress Notes (Signed)
Hemoglobin stable overnight.  Pt hungry.   Had mult dk stools yest evening but we think that was delayed transit of old bld (no assoc'd hemodynamic instability).  Pt in NAD, on dialysis.  Abd NT. Skin warm and well perfused.  Bx's from ulcer negative for dysplasia or H. Pylori. (pt aware)  RECOMMENDATIONS:  1.  Due to the marked gastric inflammation with coffee grounds noted just 24 hrs ago, the sub-optimal visualization of her ulcer base (which is how we determine risk of re-bleeding), and the patient's medical fragility, I would not recommend discharge today,  I would recommend she be observed overnight--  Could go home tomorrow morning if hgb stable and pt doing well.  2. Would dischg on twice-daily PPI such as pantoprazole, but with reinforced emphasis on medical adherence (she was ostensibly on that dose prior to admission, but developed ulcers--she admits to spotty compliance)--we discussed this with her.  3. I don't think pt needs sucralfate upon dischg--that was just for acute-phase treatment.  4. I will contact pt to arrange f/u egd in 2-3 mos to confirm ulcer healing. (also gave pt my card w/ instrns to call if she doesn't hear from [place])  Cleotis Nipper, M.D. Pager [phone] If no answer or after 5 PM call [phone]

## 2019-08-02 NOTE — Progress Notes (Addendum)
Nephrology Quick Note:  Patient complained of chest pain on hemodialysis. Initially reported "hunger pains" but did not improve with eating. Describes pain as sharp, non-radiating, no dizziness, weakness, SOB, N/V. Pt alert and oriented.  BP in 90s/40's throughout most of treatment with a.fib noted on monitor, intermittently tachycardic. No fluid removed with HD, net +200 cc.  Discussed with Dr. Hollie Salk, will order EKG and re-consult cardiology (last seen in 03/2019).  Anice Paganini, PA-C 08/02/2019, 3:09 PM  [facility] Pager: [phone]  Addendum: EKG showing a. Fib RVR, t wave inversions in lateral leads which appear to be old. Dr. Hollie Salk also independently reviewed EKG. Cardiology paged.

## 2019-08-02 NOTE — Significant Event (Signed)
Rapid Response Event Note  Overview: Time Called: 1608 Arrival Time: 1630 Event Type: Hypotension  Initial Focused Assessment: Patient with BP 60s post HD, Patient is alert and oriented eating lunch.  She states that she feels weak and tired.   Multiple BPs taken in Lower legs, left arm restricted, right arm with old graft.   136/79,  111/82,   64/37,  90/47,   100/78 Patient's mentation remains the same.  Interventions: Lab at bedside to draw stat labs CCM at bedside to assess patient 2 u PRBC ordered    Plan of Care (if not transferred): RN to call if patient's mentation declines.  Event Summary: Name of Physician Notified: Dr Cristal Ford at    Name of Consulting Physician Notified: Dr Carlis Abbott  CCM at 1615  Outcome: Stayed in room and stabalized  Event End Time: [place]  Raliegh Ip

## 2019-08-02 NOTE — Progress Notes (Signed)
Upon returning from HD pt c/o chest pain, low BP noted 42P systolic, manual BP 53/61, subsequent BP 44R and 15Q systolic, HR 008Q,  weakness and fatigue noted remains oriented X4, attending MD , rapid response notified with consult from critical care and cardiology. Orders for stat CBG, 2units of PRBC serial troponins received.

## 2019-08-02 NOTE — Consult Note (Addendum)
NAME:  Karen Morrow, MRN:  [id number], DOB:  1960/09/25, LOS: 0 ADMISSION DATE:  08/01/2019, CONSULTATION DATE:  08/02/2019 REFERRING MD: Ree Kida, CHIEF COMPLAINT:  GI Bleed/Hypotension    Brief History   Continued GI bleeding secondary to gastric ulcer now with hypotension   History of present illness   59 year old female admitted 3/3 with "bloody diarrhea". Patient reports that she has had 2 days of bloody emesis. Hemoglobin 9.1. Heme Positive. Given 1 unit RBC. Went for EGD in which revealed non-bleeding gastric ulcer. Overnight patient with continued dark bowel movements. 3/4 with progressive hypotension (Systolic 78-24 since 3/3). BP now 84/49. CBC pending. Critical Care consulted for admission.     Past Medical History  HTN, Hep C, ESRD on TTS HD, A.Fib, CHF  Significant Hospital Events   3/3 > Presents to ED   Consults:  GI  Nephrology   Procedures:  EGD 3/3 > Patient noted to have no active bleeding or fresh blood in stomach, coffee-ground noted suggestive of hemorrhagic gastritis.  Moderately large, cratered gastric ulcer with a slightly dirty base but no visible vessel or adherent clot.  Small gastric ulcer with clean base and a great deal of prepyloric mucosal edema and prominent folds.  Significant Diagnostic Tests:  N/A   Micro Data:  N/A  Antimicrobials:  N/A   Interim history/subjective:  As above   Objective   Blood pressure (!) 84/49, pulse (!) 107, temperature 97.6 F (36.4 C), temperature source Oral, resp. rate 19, height 5\' 7"  (1.702 m), weight 51 kg, SpO2 98 %.        Intake/Output Summary (Last 24 hours) at 08/02/2019 1620 Last data filed at 08/02/2019 1430 Gross per 24 hour  Intake 1139.27 ml  Output 111 ml  Net 1028.27 ml   Filed Weights   08/02/19 0500 08/02/19 1055 08/02/19 1430  Weight: 50.7 kg 50.7 kg 51 kg    Examination: General: adult female, no distress noted  HENT: Dry MM   Lungs: Clear breath sounds, no wheeze/crackles    Cardiovascular: Tachy, Irregular, no MRG Abdomen: generalized tenderness noted  Extremities: -edema  Neuro: alert, oriented, follows commands  GU: intact   Resolved Hospital Problem list     Assessment & Plan:   Hypotension in setting of Acute Blood Loss Anemia s/p EGD on 3/3 -Found non-bleeding gastric ulcer > Biopsy sent  Plan -GI Following  -Trend CBC >>> STAT hemoglobin pending  -Transfuse 2 units now due to tachycardia/hypotension -Started on PPI and sucralfate per GI   Suspect NSTEMI in setting of Blood Loss Anemia Plan  -Treatment as above  -Cardiology Consulted per Primary Team   Diastolic Heart Failure  A.Fib not on anticoagulation  H/O HTN Plan -Cardiac Monitoring  -Hold HTN medications in setting of hypotension   ESRD on HD TTS Hyperkalemia > resolved  Plan  -Nephrology Following  -Underwent HD 3/4 -Trend BMP   Chronic Hep C  -Per Documentation has not had treatment, Plan  -Follow up with GI outpatient, patient given information   Best practice:  Diet: Renal Diet  DVT prophylaxis: SCD  GI prophylaxis: PPI Glucose control: Trend Glucose  Mobility: OOB to Chair  Code Status: Full Code  Family Communication: Family updated on plan of care  Disposition: Okay to maintain on unit for now. Patient is alert, oriented, no distress, will transfuse 2 units RBC now and re-assess, if no improvement will transfer to ICU   Labs   CBC: Recent Labs  Lab 08/01/19  4742 08/01/19 1931 08/02/19 0429  WBC 9.1 9.0 7.8  NEUTROABS 7.1  --   --   HGB 9.1* 7.8* 8.0*  HCT 27.8* 22.8* 23.5*  MCV 89.7 85.1 86.1  PLT 275 228 595    Basic Metabolic Panel: Recent Labs  Lab 08/01/19 0655 08/02/19 0429  NA 138 137  K 5.6* 4.5  CL 95* 98  CO2 24 22  GLUCOSE 95 75  BUN 59* 78*  CREATININE 7.49* 8.77*  CALCIUM 9.2 9.3   GFR: Estimated Creatinine Clearance: 5.6 mL/min (A) (by C-G formula based on SCr of 8.77 mg/dL (H)). Recent Labs  Lab 08/01/19 0655  08/01/19 1930 08/01/19 1931 08/02/19 0429  WBC 9.1  --  9.0 7.8  LATICACIDVEN 2.9* 1.5  --   --     Liver Function Tests: Recent Labs  Lab 08/01/19 0655  AST 18  ALT 10  ALKPHOS 59  BILITOT 0.9  PROT 5.8*  ALBUMIN 2.5*   No results for input(s): LIPASE, AMYLASE in the last 168 hours. No results for input(s): AMMONIA in the last 168 hours.  ABG    Component Value Date/Time   PHART 7.265 (L) 03/10/2018 1301   PCO2ART 45.6 03/10/2018 1301   PO2ART 225 (H) 03/10/2018 1301   HCO3 21.3 11/27/2018 0408   TCO2 23 11/27/2018 0408   ACIDBASEDEF 6.0 (H) 11/27/2018 0408   O2SAT 77.0 11/27/2018 0408     Coagulation Profile: Recent Labs  Lab 08/01/19 0701  INR 1.3*    Cardiac Enzymes: No results for input(s): CKTOTAL, CKMB, CKMBINDEX, TROPONINI in the last 168 hours.  HbA1C: Hgb A1c MFr Bld  Date/Time Value Ref Range Status  11/26/2013 08:05 PM 4.7 <5.7 % Final    Comment:    (NOTE)                                                                       According to the ADA Clinical Practice Recommendations for 2011, when HbA1c is used as a screening test:  >=6.5%   Diagnostic of Diabetes Mellitus           (if abnormal result is confirmed) 5.7-6.4%   Increased risk of developing Diabetes Mellitus References:Diagnosis and Classification of Diabetes Mellitus,Diabetes GLOV,5643,32(RJJOA 1):S62-S69 and Standards of Medical Care in         Diabetes - 2011,Diabetes CZYS,0630,16 (Suppl 1):S11-S61.    CBG: No results for input(s): GLUCAP in the last 168 hours.  Review of Systems:   Review of Systems  Constitutional: Negative for chills and fever.  Cardiovascular: Positive for chest pain. Negative for leg swelling.  Gastrointestinal: Positive for abdominal pain, blood in stool, diarrhea and nausea. Negative for vomiting.     Past Medical History  She,  has a past medical history of Anginal pain ([place]), Asthma, Atrial fibrillation ([place]), CHF (congestive heart failure)  ([place]), Chronic kidney disease, Dyspnea, Frequent bowel movements, GERD (gastroesophageal reflux disease), Hepatitis C antibody test positive, and Hypertension.   Surgical History    Past Surgical History:  Procedure Laterality Date  . ABDOMINAL HYSTERECTOMY     "partial" (11/03/2012)  . AV FISTULA PLACEMENT Right 12/03/2013   Procedure: CREATION OF ARTERIOVENOUS (AV) FISTULA RIGHT ARM ;  Surgeon: Rosetta Posner, MD;  Location: [facility] OR;  Service: Vascular;  Laterality: Right;  . AV FISTULA PLACEMENT Left 05/17/2018   Procedure: CREATION OF BRACHIOCEPHALIC FISTULA LEFT ARM;  Surgeon: Waynetta Sandy, MD;  Location: [place];  Service: Vascular;  Laterality: Left;  . [place] REMOVAL Right 03/16/2018   Procedure: REMOVAL OF ARTERIOVENOUS GORETEX GRAFT ([place]) RIGHT ARM;  Surgeon: Waynetta Sandy, MD;  Location: [place];  Service: Vascular;  Laterality: Right;  . BASCILIC VEIN TRANSPOSITION Right 03/06/2014   Procedure: SECOND STAGE BASILIC VEIN TRANSPOSITION;  Surgeon: Rosetta Posner, MD;  Location: [place];  Service: Vascular;  Laterality: Right;  . BIOPSY  09/30/2018   Procedure: BIOPSY;  Surgeon: Mauri Pole, MD;  Location: [facility] ENDOSCOPY;  Service: Endoscopy;;  . ENTEROSCOPY N/A 10/06/2018   Procedure: ENTEROSCOPY;  Surgeon: Lavena Bullion, DO;  Location: [place];  Service: Gastroenterology;  Laterality: N/A;  . ESOPHAGOGASTRODUODENOSCOPY N/A 06/05/2013   Procedure: ESOPHAGOGASTRODUODENOSCOPY (EGD);  Surgeon: Winfield Cunas., MD;  Location: [facility];  Service: Endoscopy;  Laterality: N/A;  . ESOPHAGOGASTRODUODENOSCOPY N/A 09/08/2016   Procedure: ESOPHAGOGASTRODUODENOSCOPY (EGD);  Surgeon: Milus Banister, MD;  Location: [place];  Service: Endoscopy;  Laterality: N/A;  . ESOPHAGOGASTRODUODENOSCOPY (EGD) WITH PROPOFOL N/A 09/17/2018   Procedure: ESOPHAGOGASTRODUODENOSCOPY (EGD) WITH PROPOFOL;  Surgeon: Carol Ada, MD;  Location: [place];  Service: Endoscopy;   Laterality: N/A;  . ESOPHAGOGASTRODUODENOSCOPY (EGD) WITH PROPOFOL N/A 09/30/2018   Procedure: ESOPHAGOGASTRODUODENOSCOPY (EGD) WITH PROPOFOL;  Surgeon: Mauri Pole, MD;  Location: [place] ENDOSCOPY;  Service: Endoscopy;  Laterality: N/A;  . INSERTION OF DIALYSIS CATHETER Left 03/16/2018   Procedure: PLACEMENT OF TUNNEL DIALYSIS CATHETER;  Surgeon: Waynetta Sandy, MD;  Location: [place];  Service: Vascular;  Laterality: Left;  . INSERTION OF DIALYSIS CATHETER Right 09/26/2018   Procedure: INSERTION OF DIALYSIS  TUNNELED CATHETER;  Surgeon: Elam Dutch, MD;  Location[facility] OR;  Service: Vascular;  Laterality: Right;  . IR REMOVAL TUN CV CATH W/O FL  09/17/2018  . REVISON OF ARTERIOVENOUS FISTULA Right 01/28/2017   Procedure: REVISON OF RIGHT ARM ARTERIOVENOUS FISTULA USING 8MMX10CM GORETEX GRAFT;  Surgeon: Angelia Mould, MD;  Location: [place];  Service: Vascular;  Laterality: Right;     Social History   reports that she has been smoking cigarettes. She has a 40.00 pack-year smoking history. She has never used smokeless tobacco. She reports previous alcohol use. She reports previous drug use. Drug: Marijuana.   Family History   Her family history includes Diabetes in her father; Heart attack (age of onset: 28) in her father; Lupus in her mother.   Allergies Allergies  Allergen Reactions  . Cefazolin Other (See Comments)    Severe thrombocytopenia (has tolerated zosyn in the past) ID aware Tolerated in October 2019  . Cephalosporins Anaphylaxis  . Tuberculin Swelling     Home Medications  Prior to Admission medications   Medication Sig Start Date End Date Taking? Authorizing Provider  acetaminophen (TYLENOL) 500 MG tablet Take 500-1,000 mg by mouth every 6 (six) hours as needed for headache (pain).   Yes [provider]  albuterol (PROVENTIL HFA;VENTOLIN HFA) 108 (90 Base) MCG/ACT inhaler TAKE 2 PUFFS BY MOUTH EVERY 6 HOURS AS NEEDED FOR WHEEZE OR SHORTNESS OF  BREATH Patient taking differently: Inhale 2 puffs into the lungs every 6 (six) hours as needed for wheezing or shortness of breath.  05/09/18  Yes [name], MD  amLODipine (NORVASC) 10 MG tablet Take 1 tablet (10 mg  total) by mouth daily. Patient taking differently: Take 10 mg by mouth at bedtime.  05/16/18  Yes Thurnell Lose, MD  b complex-vitamin c-folic acid (NEPHRO-VITE) 0.8 MG TABS tablet Take 1 tablet by mouth every evening. 07/27/18  Yes [provider]  calcium acetate (PHOSLO) 667 MG capsule Take 1,334 mg by mouth 3 (three) times daily.   Yes [provider]  cinacalcet (SENSIPAR) 30 MG tablet TAKE 1 TABLET BY MOUTH DAILY EVERY EVENING (DO NOT TAKE LESS THAN 12 HOURS PRIOR TO DIALYSIS) Patient taking differently: Take 30 mg by mouth daily with supper. (DO NOT TAKE LESS THAN 12 HOURS PRIOR TO DIALYSIS) 09/19/18  Yes Hosie Poisson, MD  ferric citrate (AURYXIA) 1 GM 210 MG(Fe) tablet Take 420 mg by mouth 3 (three) times daily with meals.   Yes [provider]  pantoprazole (PROTONIX) 40 MG tablet Take 1 tablet (40 mg total) by mouth 2 (two) times daily. 10/01/18  Yes Mercy Riding, MD  Amino Acids-Protein Hydrolys (FEEDING SUPPLEMENT, PRO-STAT SUGAR FREE 64,) LIQD Take 30 mLs by mouth 2 (two) times daily. Patient not taking: Reported on 08/01/2019 09/19/18   Hosie Poisson, MD  labetalol (NORMODYNE) 200 MG tablet Take 200 mg by mouth 2 (two) times daily.    [provider]     Hayden Pedro, AGACNP-BC [facility]  Pgr: [phone]  PCCM Pgr: [phone]

## 2019-08-02 NOTE — Progress Notes (Addendum)
CRITICAL VALUE ALERT  Critical Value: Troponin 917   Date & Time Notied:  08/02/19 1750  Provider Notified: Dr Ree Kida, PA Gus Height  Orders Received/Actions taken: none

## 2019-08-03 ENCOUNTER — Inpatient Hospital Stay (HOSPITAL_COMMUNITY): Payer: Medicare Other

## 2019-08-03 DIAGNOSIS — I340 Nonrheumatic mitral (valve) insufficiency: Secondary | ICD-10-CM

## 2019-08-03 DIAGNOSIS — I351 Nonrheumatic aortic (valve) insufficiency: Secondary | ICD-10-CM

## 2019-08-03 DIAGNOSIS — R578 Other shock: Secondary | ICD-10-CM

## 2019-08-03 DIAGNOSIS — I352 Nonrheumatic aortic (valve) stenosis with insufficiency: Secondary | ICD-10-CM

## 2019-08-03 DIAGNOSIS — R079 Chest pain, unspecified: Secondary | ICD-10-CM

## 2019-08-03 DIAGNOSIS — K2971 Gastritis, unspecified, with bleeding: Secondary | ICD-10-CM

## 2019-08-03 DIAGNOSIS — R778 Other specified abnormalities of plasma proteins: Secondary | ICD-10-CM

## 2019-08-03 LAB — CBC
HCT: 29.2 % — ABNORMAL LOW (ref 36.0–46.0)
HCT: 27.7 % — ABNORMAL LOW (ref 36.0–46.0)
HCT: 28.9 % — ABNORMAL LOW (ref 36.0–46.0)
Hemoglobin: 10.3 g/dL — ABNORMAL LOW (ref 12.0–15.0)
Hemoglobin: 9.6 g/dL — ABNORMAL LOW (ref 12.0–15.0)
Hemoglobin: 10.1 g/dL — ABNORMAL LOW (ref 12.0–15.0)
MCH: 31 pg (ref 26.0–34.0)
MCH: 30.2 pg (ref 26.0–34.0)
MCH: 31.1 pg (ref 26.0–34.0)
MCHC: 35.3 g/dL (ref 30.0–36.0)
MCHC: 34.7 g/dL (ref 30.0–36.0)
MCHC: 34.9 g/dL (ref 30.0–36.0)
MCV: 88 fL (ref 80.0–100.0)
MCV: 87.1 fL (ref 80.0–100.0)
MCV: 88.9 fL (ref 80.0–100.0)
Platelets: 147 10*3/uL — ABNORMAL LOW (ref 150–400)
Platelets: 154 10*3/uL (ref 150–400)
Platelets: 167 10*3/uL (ref 150–400)
RBC: 3.32 MIL/uL — ABNORMAL LOW (ref 3.87–5.11)
RBC: 3.18 MIL/uL — ABNORMAL LOW (ref 3.87–5.11)
RBC: 3.25 MIL/uL — ABNORMAL LOW (ref 3.87–5.11)
RDW: 13.2 % (ref 11.5–15.5)
RDW: 13 % (ref 11.5–15.5)
RDW: 13.3 % (ref 11.5–15.5)
WBC: 6.1 10*3/uL (ref 4.0–10.5)
WBC: 5.7 10*3/uL (ref 4.0–10.5)
WBC: 6.2 10*3/uL (ref 4.0–10.5)
nRBC: 0 % (ref 0.0–0.2)
nRBC: 0 % (ref 0.0–0.2)
nRBC: 0 % (ref 0.0–0.2)

## 2019-08-03 LAB — BASIC METABOLIC PANEL
Anion gap: 14 (ref 5–15)
BUN: 20 mg/dL (ref 6–20)
CO2: 25 mmol/L (ref 22–32)
Calcium: 8.4 mg/dL — ABNORMAL LOW (ref 8.9–10.3)
Chloride: 100 mmol/L (ref 98–111)
Creatinine, Ser: 4.29 mg/dL — ABNORMAL HIGH (ref 0.44–1.00)
GFR calc Af Amer: 12 mL/min — ABNORMAL LOW (ref >60)
GFR calc non Af Amer: 11 mL/min — ABNORMAL LOW (ref >60)
Glucose, Bld: 92 mg/dL (ref 70–99)
Potassium: 3.2 mmol/L — ABNORMAL LOW (ref 3.5–5.1)
Sodium: 139 mmol/L (ref 135–145)

## 2019-08-03 LAB — TYPE AND SCREEN
ABO/RH(D): O POS
Antibody Screen: NEGATIVE
Unit division: 0
Unit division: 0

## 2019-08-03 LAB — BPAM RBC
Blood Product Expiration Date: 202104012359
Blood Product Expiration Date: 202104032359
ISSUE DATE / TIME: 202103041830
ISSUE DATE / TIME: 202103042109
Unit Type and Rh: 5100
Unit Type and Rh: 5100

## 2019-08-03 LAB — IRON AND TIBC
Iron: 215 ug/dL — ABNORMAL HIGH (ref 28–170)
Saturation Ratios: 81 % — ABNORMAL HIGH (ref 10.4–31.8)
TIBC: 266 ug/dL (ref 250–450)
UIBC: 51 ug/dL

## 2019-08-03 LAB — RETICULOCYTES
Immature Retic Fract: 32.5 % — ABNORMAL HIGH (ref 2.3–15.9)
RBC.: 3.13 MIL/uL — ABNORMAL LOW (ref 3.87–5.11)
Retic Count, Absolute: 69.8 10*3/uL (ref 19.0–186.0)
Retic Ct Pct: 2.2 % (ref 0.4–3.1)

## 2019-08-03 LAB — ECHOCARDIOGRAM COMPLETE
Height: 67 in
Weight: 1828.8 oz

## 2019-08-03 LAB — FERRITIN: Ferritin: 488 ng/mL — ABNORMAL HIGH (ref 11–307)

## 2019-08-03 LAB — MAGNESIUM: Magnesium: 2.3 mg/dL (ref 1.7–2.4)

## 2019-08-03 LAB — FOLATE: Folate: 4.7 ng/mL — ABNORMAL LOW (ref >5.9)

## 2019-08-03 LAB — VITAMIN B12: Vitamin B-12: 275 pg/mL (ref 180–914)

## 2019-08-03 MED ORDER — POTASSIUM CHLORIDE CRYS ER 20 MEQ PO TBCR: 40.0000 meq | EXTENDED_RELEASE_TABLET | Freq: Once | ORAL | Status: DC

## 2019-08-03 MED ORDER — POTASSIUM CHLORIDE CRYS ER 20 MEQ PO TBCR
20.0000 meq | EXTENDED_RELEASE_TABLET | Freq: Once | ORAL | Status: AC
  Administered 2019-08-03: 20 meq via ORAL
  Filled 2019-08-03: qty 1

## 2019-08-03 NOTE — Progress Notes (Signed)
PROGRESS NOTE    Karen Morrow  ONG:[id number] DOB: 1960/11/13 DOA: 08/01/2019 PCP: [name], MD   Brief Narrative:  HPI On 08/01/2019 by Dr. Karmen Bongo Karen Morrow is a 59 y.o. female with medical history significant of HTN; Hep C; ESRD on TTS HD; afib; and CHF presenting with n/v/d with hematemesis.  She reports vomiting blood x 2 days.  Also with diarrhea with blood.  She is getting increasingly weak, can't even walk.  She thinks this also happened to her last year in May.  She doesn't remember anything about that admission.  She denies abdominal pain.  She is no longer having hematemesis but it was black; also with black stools.  She is not on blood thinners, "I am on nothing."  No fever.  She was able to complete her entire session yesterday, but she had fecal incontinence there with dark stools.  HD did not send her to the hospital, she came in on her own.  Interim history Admitted with GI bleed, s/p EGD and blood transfusion. GI wants to watch for additional night to trend hemoglobin given dark stools overnight. Assessment & Plan   Upper GI bleed/symptomatic anemia/acute on chronic normocytic Anemia -Patient presented with lightheadedness and fatigue; FOBT + -Gastroenterology consulted and appreciated -Patient has had multiple EGDs -EGD on 08/01/2019: Patient noted to have no active bleeding or fresh blood in stomach, coffee-ground noted suggestive of hemorrhagic gastritis.  Moderately large, cratered gastric ulcer with a slightly dirty base but no visible vessel or adherent clot.  Small gastric ulcer with clean base and a great deal of prepyloric mucosal edema and prominent folds. -Discussed with gastroenterology, Dr. Cristina Gong, would like to continue to monitor hemoglobin overnight given continued dark stools and EGD findings. -hemoglobin dropped to 6.5, transfused 2u PRBCs on 08/02/2019 -placed patient on protonix drip -hemoglobin today 9.6 -continue to monitor  CBC -pending anemia panel  New onset atrial fibrillation -currently in sinus rhythm -Echocardiogram pending -possibly related to the above -magnesium replaced (was 1.6), pending repeat today -Cardiology consulted and appreciated -TSH WNL (1.235)  Chest pain/Elevated troponin  -Troponin peaked to 1251- possible due to demand ischemia -No further reports of chest pain -As above, cardiology consulted and appreciated -Echocardiogram pending  Hypotension/Hemorrhagic shock -Patient with SBPs as low as 50-60 -mental status clear -likely due to anemia/GI Bleed -Currently BP 82/62 -home meds, labetalol and amlodipine held -PCCM consulted and appreciated for possible pressors   ESRD -Patient dialyzes on Tuesday, Thursday, Saturday -Nephrology consulted and appreciated -Continue PhosLo, Sensipar, Nephro-Vite  Hyperkalemia/Hypokalemia -hyperkalemia resolved- now with hypokalemia -will defer to nephrology for replacement -continue to monitor   Chronic diastolic CHF  -last echocardiogram Dec 2019 showed G1DD -pending repeat Echocardiogram -currently appears to be euvolemic and compensated -volume status controlled with HD (however during HD on 3/4, no fluid pulled off)  Chronic hepatitis C -Patient with elevated quant and in April 2020 -Does not appear to have outpatient follow-up, hopefully will continue to follow-up with gastroenterology  Essential hypertension -BPs are currently soft, likely associated with anemia and GI bleeding -BP medications, amlodipine and labetalol currently held -Continue to monitor closely  Fatty liver/Gallbladder polyps -noted on Abdominal US  DVT Prophylaxis  SCDs  Code Status: Full  Family Communication: None at bedside  Disposition Plan: Admitted from home for anemia and GI bleed.  Developed elevated troponin level with chest pain as well as new onset atrial fibrillation.  Cardiology consulted, pending echocardiogram and further  recommendations.  Patient  also with continued drop in hemoglobin, received 2 units PRBC.  Pending further GI recommendations.  Suspect home when stable likely within several days.   Consultants Gastroenterology  Nephrology Cardiology PCCM  Procedures  EGD Echocardiogram  Antibiotics   Anti-infectives (From admission, onward)   None      Subjective:   Karen Morrow seen and examined today.  Patient eager to go home today. Denies current chest pain, shortness of breath, abdominal pain, nausea or vomiting, dizziness or headache.  States she did have some dark stools overnight.  Anxious to eat.  Objective:   Vitals:   08/02/19 2205 08/03/19 0000 08/03/19 0006 08/03/19 0328  BP: (!) 87/71 (!) 87/63  (!) 82/62  Pulse: 89 100 (!) 101 96  Resp: (!) 23 19 20 20   Temp:   98.6 F (37 C) 98.1 F (36.7 C)  TempSrc:   Oral Oral  SpO2: (!) 87% 98% 97% 96%  Weight:    51.8 kg  Height:        Intake/Output Summary (Last 24 hours) at 08/03/2019 1033 Last data filed at 08/03/2019 0600 Gross per 24 hour  Intake 1278 ml  Output -89 ml  Net 1367 ml   Filed Weights   08/02/19 1055 08/02/19 1430 08/03/19 0328  Weight: 50.7 kg 51 kg 51.8 kg   Exam  General: Well developed, well nourished, NAD, appears stated age  HEENT: NCAT, mucous membranes moist.   Cardiovascular: S1 S2 auscultated, regular, 3/6 SEM  Respiratory: Clear to auscultation bilaterally   Abdomen: Soft, nontender, nondistended, + bowel sounds  Extremities: warm dry without cyanosis clubbing or edema  Neuro: AAOx3, nonfocal  Psych: appropriate mood and affect, pleasant    Data Reviewed: I have personally reviewed following labs and imaging studies  CBC: Recent Labs  Lab 08/01/19 0655 08/01/19 0655 08/01/19 1931 08/02/19 0429 08/02/19 1834 08/03/19 0319 08/03/19 0541  WBC 9.1   < > 9.0 7.8 6.6 6.1 5.7  NEUTROABS 7.1  --   --   --   --   --   --   HGB 9.1*   < > 7.8* 8.0* 6.5* 10.3* 9.6*  HCT 27.8*   <  > 22.8* 23.5* 19.0* 29.2* 27.7*  MCV 89.7   < > 85.1 86.1 85.6 88.0 87.1  PLT 275   < > 228 230 184 147* 154   < > = values in this interval not displayed.   Basic Metabolic Panel: Recent Labs  Lab 08/01/19 0655 08/02/19 0429 08/02/19 1834 08/03/19 0319  NA 138 137  --  139  K 5.6* 4.5  --  3.2*  CL 95* 98  --  100  CO2 24 22  --  25  GLUCOSE 95 75  --  92  BUN 59* 78*  --  20  CREATININE 7.49* 8.77*  --  4.29*  CALCIUM 9.2 9.3  --  8.4*  MG  --   --  1.6*  --    GFR: Estimated Creatinine Clearance: 11.5 mL/min (A) (by C-G formula based on SCr of 4.29 mg/dL (H)). Liver Function Tests: Recent Labs  Lab 08/01/19 0655  AST 18  ALT 10  ALKPHOS 59  BILITOT 0.9  PROT 5.8*  ALBUMIN 2.5*   No results for input(s): LIPASE, AMYLASE in the last 168 hours. No results for input(s): AMMONIA in the last 168 hours. Coagulation Profile: Recent Labs  Lab 08/01/19 0701  INR 1.3*   Cardiac Enzymes: No results for input(s): CKTOTAL, CKMB, CKMBINDEX,  TROPONINI in the last 168 hours. BNP (last 3 results) No results for input(s): PROBNP in the last 8760 hours. HbA1C: No results for input(s): HGBA1C in the last 72 hours. CBG: No results for input(s): GLUCAP in the last 168 hours. Lipid Profile: No results for input(s): CHOL, HDL, LDLCALC, TRIG, CHOLHDL, LDLDIRECT in the last 72 hours. Thyroid Function Tests: Recent Labs    08/02/19 1834  TSH 1.235   Anemia Panel: Recent Labs    08/03/19 0706  RETICCTPCT 2.2   Urine analysis:    Component Value Date/Time   COLORURINE YELLOW 11/26/2013 1819   APPEARANCEUR TURBID (A) 11/26/2013 1819   LABSPEC 1.021 11/26/2013 1819   PHURINE 5.5 11/26/2013 1819   GLUCOSEU NEGATIVE 11/26/2013 1819   HGBUR SMALL (A) 11/26/2013 1819   BILIRUBINUR NEGATIVE 11/26/2013 1819   KETONESUR NEGATIVE 11/26/2013 1819   PROTEINUR >300 (A) 11/26/2013 1819   UROBILINOGEN 0.2 11/26/2013 1819   NITRITE POSITIVE (A) 11/26/2013 1819   LEUKOCYTESUR  MODERATE (A) 11/26/2013 1819   Sepsis Labs: @LABRCNTIP (procalcitonin:4,lacticidven:4)  ) Recent Results (from the past 240 hour(s))  Respiratory Panel by RT PCR (Flu A&B, Covid) - Nasopharyngeal Swab     Status: None   Collection Time: 08/01/19  9:03 AM   Specimen: Nasopharyngeal Swab  Result Value Ref Range Status   SARS Coronavirus 2 by RT PCR NEGATIVE NEGATIVE Final    Comment: (NOTE) SARS-CoV-2 target nucleic acids are NOT DETECTED. The SARS-CoV-2 RNA is generally detectable in upper respiratoy specimens during the acute phase of infection. The lowest concentration of SARS-CoV-2 viral copies this assay can detect is 131 copies/mL. A negative result does not preclude SARS-Cov-2 infection and should not be used as the sole basis for treatment or other patient management decisions. A negative result may occur with  improper specimen collection/handling, submission of specimen other than nasopharyngeal swab, presence of viral mutation(s) within the areas targeted by this assay, and inadequate number of viral copies (<131 copies/mL). A negative result must be combined with clinical observations, patient history, and epidemiological information. The expected result is Negative. Fact Sheet for Patients:  PinkCheek.be Fact Sheet for Healthcare Providers:  GravelBags.it This test is not yet ap proved or cleared by the [place] FDA and  has been authorized for detection and/or diagnosis of SARS-CoV-2 by FDA under an Emergency Use Authorization (EUA). This EUA will remain  in effect (meaning this test can be used) for the duration of the COVID-19 declaration under Section 564(b)(1) of the Act, 21 U.S.C. section 360bbb-3(b)(1), unless the authorization is terminated or revoked sooner.    Influenza A by PCR NEGATIVE NEGATIVE Final   Influenza B by PCR NEGATIVE NEGATIVE Final    Comment: (NOTE) The Xpert Xpress  SARS-CoV-2/FLU/RSV assay is intended as an aid in  the diagnosis of influenza from Nasopharyngeal swab specimens and  should not be used as a sole basis for treatment. Nasal washings and  aspirates are unacceptable for Xpert Xpress SARS-CoV-2/FLU/RSV  testing. Fact Sheet for Patients: PinkCheek.be Fact Sheet for Healthcare Providers: GravelBags.it This test is not yet approved or cleared by the [place] FDA and  has been authorized for detection and/or diagnosis of SARS-CoV-2 by  FDA under an Emergency Use Authorization (EUA). This EUA will remain  in effect (meaning this test can be used) for the duration of the  Covid-19 declaration under Section 564(b)(1) of the Act, 21  U.S.C. section 360bbb-3(b)(1), unless the authorization is  terminated or revoked. Performed at [facility]  Lab, [address] [place], [place] [address]       Radiology Studies: US Abdomen Limited RUQ  Result Date: 08/03/2019 CLINICAL DATA:  Abdominal pain EXAM: ULTRASOUND ABDOMEN LIMITED RIGHT UPPER QUADRANT COMPARISON:  09/29/2018 FINDINGS: Gallbladder: Gallbladder is well distended with small echogenic foci consistent with polyps. No pericholecystic fluid is noted. No cholelithiasis is seen. Common bile duct: Diameter: 5 mm Liver: Generalized increased echogenicity is noted consistent with fatty infiltration. No focal mass is noted. Portal vein is patent on color Doppler imaging with normal direction of blood flow towards the liver. Other: None. IMPRESSION: Fatty liver. Gallbladder polyps. Electronically Signed   By: Inez Catalina M.D.   On: 08/03/2019 02:47     Scheduled Meds: . sodium chloride   Intravenous Once  . sodium chloride   Intravenous Once  . Chlorhexidine Gluconate Cloth  6 each Topical Q0600  . cinacalcet  30 mg Oral Q supper  . darbepoetin (ARANESP) injection - DIALYSIS  40 mcg Intravenous Q Thu-HD  . ferric citrate  420 mg Oral  TID WC  . gabapentin  100 mg Oral QHS  . sodium chloride flush  3 mL Intravenous Q12H  . sucralfate  1 g Oral TID WC & HS   Continuous Infusions: . pantoprozole (PROTONIX) infusion 8 mg/hr (08/03/19 0741)     LOS: 1 day   Time Spent in minutes   45 minutes  Cedricka Sackrider D.O. on 08/03/2019 at 10:33 AM  Between 7am to 7pm - Please see pager noted on [email or web address]  After 7pm go to [email or web address]  And look for the night coverage person covering for me after hours  [facility] Office  [phone]

## 2019-08-03 NOTE — Progress Notes (Addendum)
[facility] Progress Note   Subjective:   Patient seen and examined at bedside.  Blood pressure low after dialysis yesterday and again this AM.  Reports feeling better today.  Denies palpitations, dizziness, weakness, CP, n/v/d, abdominal pain and edema.  Objective Vitals:   08/02/19 2205 08/03/19 0000 08/03/19 0006 08/03/19 0328  BP: (!) 87/71 (!) 87/63  (!) 82/62  Pulse: 89 100 (!) 101 96  Resp: (!) 23 19 20 20   Temp:   98.6 F (37 C) 98.1 F (36.7 C)  TempSrc:   Oral Oral  SpO2: (!) 87% 98% 97% 96%  Weight:    51.8 kg  Height:       Physical Exam General:NAD, chronically ill appearing, thin female, laying in bed Heart:RRR +1/5 systolic murmur Lungs:CTAB Abdomen:soft, NTND Extremities:no LE edema Dialysis Access: LU AVF +b   Filed Weights   08/02/19 1055 08/02/19 1430 08/03/19 0328  Weight: 50.7 kg 51 kg 51.8 kg    Intake/Output Summary (Last 24 hours) at 08/03/2019 1143 Last data filed at 08/03/2019 0600 Gross per 24 hour  Intake 1278 ml  Output -89 ml  Net 1367 ml    Additional Objective Labs: Basic Metabolic Panel: Recent Labs  Lab 08/01/19 0655 08/02/19 0429 08/03/19 0319  NA 138 137 139  K 5.6* 4.5 3.2*  CL 95* 98 100  CO2 24 22 25   GLUCOSE 95 75 92  BUN 59* 78* 20  CREATININE 7.49* 8.77* 4.29*  CALCIUM 9.2 9.3 8.4*   Liver Function Tests: Recent Labs  Lab 08/01/19 0655  AST 18  ALT 10  ALKPHOS 59  BILITOT 0.9  PROT 5.8*  ALBUMIN 2.5*   CBC: Recent Labs  Lab 08/01/19 0655 08/01/19 0655 08/01/19 1931 08/01/19 1931 08/02/19 0429 08/02/19 0429 08/02/19 1834 08/03/19 0319 08/03/19 0541  WBC 9.1   < > 9.0   < > 7.8   < > 6.6 6.1 5.7  NEUTROABS 7.1  --   --   --   --   --   --   --   --   HGB 9.1*   < > 7.8*   < > 8.0*   < > 6.5* 10.3* 9.6*  HCT 27.8*   < > 22.8*   < > 23.5*   < > 19.0* 29.2* 27.7*  MCV 89.7   < > 85.1  --  86.1  --  85.6 88.0 87.1  PLT 275   < > 228   < > 230   < > 184 147* 154   < > = values in this  interval not displayed.    Lab Results  Component Value Date   INR 1.3 (H) 08/01/2019   INR 1.2 10/05/2018   INR 1.0 09/15/2018   Studies/Results: US Abdomen Limited RUQ  Result Date: 08/03/2019 CLINICAL DATA:  Abdominal pain EXAM: ULTRASOUND ABDOMEN LIMITED RIGHT UPPER QUADRANT COMPARISON:  09/29/2018 FINDINGS: Gallbladder: Gallbladder is well distended with small echogenic foci consistent with polyps. No pericholecystic fluid is noted. No cholelithiasis is seen. Common bile duct: Diameter: 5 mm Liver: Generalized increased echogenicity is noted consistent with fatty infiltration. No focal mass is noted. Portal vein is patent on color Doppler imaging with normal direction of blood flow towards the liver. Other: None. IMPRESSION: Fatty liver. Gallbladder polyps. Electronically Signed   By: Inez Catalina M.D.   On: 08/03/2019 02:47    Medications: . pantoprozole (PROTONIX) infusion 8 mg/hr (08/03/19 0741)   . sodium chloride   Intravenous  Once  . sodium chloride   Intravenous Once  . Chlorhexidine Gluconate Cloth  6 each Topical Q0600  . cinacalcet  30 mg Oral Q supper  . darbepoetin (ARANESP) injection - DIALYSIS  40 mcg Intravenous Q Thu-HD  . ferric citrate  420 mg Oral TID WC  . gabapentin  100 mg Oral QHS  . sodium chloride flush  3 mL Intravenous Q12H  . sucralfate  1 g Oral TID WC & HS    [facility], 3:45hr, BFR 350/ DFR 600, EDW 51kg, 2K/2.5Ca, 16g needles hectorol 3 mcg IV q HD Lokelma 10g on non-HD days Auryxia 2 tabs TID with meals (pt reports taking "blue and white pill" which is likely calcium acetate)  Assessment/Plan: 1. Acute blood loss anemia 2/2 GI bleed: coffee ground emesis x2d. Hgb 9.1 on admit, baseline 13-14. EGD suggestive of hemorrhagic gastritis and gastric ulcer. s/p 2units pRBC on 3/4. Hgb 7.8 > 8.0>10.3>9.6.  Per primary/GI. Will start aranesp with HD.  2. New onset A fib - Cardiology  consulting. Back to NSR w/short runs SVT or Afib.  ECHO pending.  Need to try to keep K^ during HD, plan to use increased K bath while on HD.  3. Chest pain - thought to be due to demand ischemia - Cardio following 4. ESRD:TTS schedule- Orders written for HD tomorrow per regular schedule with Raliegh Ip bath.  5. Hyperkalemia: K+ 5.6 on presentation, now hypokalemic. Recheck labs tomorrow prior to HD. 6. Hypertension/volume:BP soft, not on any BP meds. Above her EDW but is euvolemic on exam. Minimal to no UF with HD tomorrow. 7. Metabolic bone disease:Calcium 8.4, high earlier in admission. Calcium acetate d/c and auryxia resumed once eating. Restart Hectorol.  8. Nutrition:Renal diet with fluid restrictions.  9. Chronic diastolic CHF - cardiology consulting  Jen Mow, PA-C [facility] Pager: [phone] 08/03/2019,11:43 AM  LOS: 1 day    Nephrology attending: Patient was seen and examined at bedside.  Chart reviewed.  I agree with assessment and plan as outlined above.  ESRD on HD admitted with acute GI bleed, anemia.  Patient developed A. fib with RVR last night.  Echo unremarkable.  Cardiology is following.  Not a candidate for anticoagulation.  Heart rate is controlled.  She is clinically looking much better today.  Plan for next dialysis tomorrow, 4K and no heparin.  Katheran James, MD [facility]

## 2019-08-03 NOTE — Progress Notes (Signed)
   NAME:  Karen Morrow, MRN:  [id number], DOB:  1960/07/10, LOS: 1 ADMISSION DATE:  08/01/2019, CONSULTATION DATE:  08/02/2019 REFERRING MD: Ree Kida, CHIEF COMPLAINT:  GI Bleed/Hypotension    Brief History   Continued GI bleeding secondary to gastric ulcer  PCCM consulted 3/4 for hypotension   History of present illness   59 year old female admitted 3/3 with "bloody diarrhea". Patient reports that she has had 2 days of bloody emesis. Hemoglobin 9.1. Heme Positive. Given 1 unit RBC. Went for EGD in which revealed non-bleeding gastric ulcer. Overnight patient with continued dark bowel movements. 3/4 with progressive hypotension (Systolic 40-81 since 3/3). BP now 84/49. CBC pending. Critical Care consulted for admission.     Past Medical History  HTN, Hep C, ESRD on TTS HD, A.Fib, CHF  Significant Hospital Events   3/3 > Presents to ED  3/4 transfused 2 units PRBC  Consults:  GI  Nephrology   Procedures:  EGD 3/3 > Patient noted to have no active bleeding or fresh blood in stomach, coffee-ground noted suggestive of hemorrhagic gastritis.  Moderately large, cratered gastric ulcer with a slightly dirty base but no visible vessel or adherent clot.  Small gastric ulcer with clean base and a great deal of prepyloric mucosal edema and prominent folds.  Significant Diagnostic Tests:  N/A   Micro Data:  N/A  Antimicrobials:  N/A   Interim history/subjective:   Feels much improved Denies chest pain or dyspnea BP values recorded ranging from 77 systolic to 448  Objective   Blood pressure (!) 82/62, pulse 96, temperature 98.1 F (36.7 C), temperature source Oral, resp. rate 20, height 5\' 7"  (1.702 m), weight 51.8 kg, SpO2 96 %.        Intake/Output Summary (Last 24 hours) at 08/03/2019 1021 Last data filed at 08/03/2019 0600 Gross per 24 hour  Intake 1278 ml  Output -89 ml  Net 1367 ml   Filed Weights   08/02/19 1055 08/02/19 1430 08/03/19 0328  Weight: 50.7 kg 51 kg 51.8 kg     Examination: General: adult female, no distress noted  HENT: Dry MM, no JVD  Lungs: Clear breath sounds, no wheeze/crackles  Cardiovascular: S1-S2 irregular, ESM 3/6 at base Abdomen: generalized tenderness noted  Extremities: -edema  Neuro: alert, oriented, follows commands  GU: intact   Labs show drop in hemoglobin from 8-6.5 and then improved to 10.3 and stable at 9.6.  mild hypokalemia  Resolved Hospital Problem list     Assessment & Plan:   Hemorrhagic shock -resolved Acute Blood Loss Anemia s/p EGD on 3/3 -Found non-bleeding gastric ulcer > Biopsy sent  Status post 2 units PRBC 3/4 Plan -GI Following, PPI plus Carafate -Hemoglobin appears stable, blood pressure values seem inaccurate    NSTEMI in setting of Blood Loss Anemia  Acute Diastolic Heart Failure  New onset atrial fibrillation Moderate aortic stenosis H/O HTN Plan -tele -cardiology following, reviewed their consult -Would be concerned about fluid overload if more transfusions require -Hold HTN medications in setting of hypotension   ESRD on HD TTS Hyperkalemia > resolved  Plan  -Nephrology Following  -last HD 3/4 -Trend BMP   Chronic Hep C -Per Documentation has not had treatment, Fatty liver on ultrasound with gallbladder polyps  PCCM will be available as needed  Kara Mead MD. [facility]. [facility]  If no response to pager , please call [phone]   08/03/2019   -

## 2019-08-03 NOTE — Progress Notes (Signed)
Pt continues to have tarry black stools, no overt bleeding noted, denies nausea or stomach discomfort , MD notified.

## 2019-08-03 NOTE — Progress Notes (Addendum)
Progress Note  Patient Name: Karen Morrow Date of Encounter: 08/03/2019  Primary Cardiologist: Quay Burow, MD   Subjective   Patient feeling much better today. Back in normal sinus rhythm. Chest pain has resolved. No shortness of breath, palpitations, lightheadedness, or dizziness. She is eager to go home.  Inpatient Medications    Scheduled Meds: . sodium chloride   Intravenous Once  . sodium chloride   Intravenous Once  . Chlorhexidine Gluconate Cloth  6 each Topical Q0600  . cinacalcet  30 mg Oral Q supper  . darbepoetin (ARANESP) injection - DIALYSIS  40 mcg Intravenous Q Thu-HD  . ferric citrate  420 mg Oral TID WC  . gabapentin  100 mg Oral QHS  . sodium chloride flush  3 mL Intravenous Q12H  . sucralfate  1 g Oral TID WC & HS   Continuous Infusions: . pantoprozole (PROTONIX) infusion 8 mg/hr (08/03/19 0741)   PRN Meds: acetaminophen **OR** acetaminophen, albuterol, calcium carbonate (dosed in mg elemental calcium), camphor-menthol **AND** hydrOXYzine, docusate sodium, feeding supplement (NEPRO CARB STEADY), ondansetron **OR** ondansetron (ZOFRAN) IV, sorbitol, zolpidem   Vital Signs    Vitals:   08/02/19 2205 08/03/19 0000 08/03/19 0006 08/03/19 0328  BP: (!) 87/71 (!) 87/63  (!) 82/62  Pulse: 89 100 (!) 101 96  Resp: (!) 23 19 20 20   Temp:   98.6 F (37 C) 98.1 F (36.7 C)  TempSrc:   Oral Oral  SpO2: (!) 87% 98% 97% 96%  Weight:    51.8 kg  Height:        Intake/Output Summary (Last 24 hours) at 08/03/2019 0833 Last data filed at 08/03/2019 0600 Gross per 24 hour  Intake 1278 ml  Output -89 ml  Net 1367 ml   Last 3 Weights 08/03/2019 08/02/2019 08/02/2019  Weight (lbs) 114 lb 4.8 oz 112 lb 7 oz 111 lb 12.4 oz  Weight (kg) 51.846 kg 51 kg 50.7 kg      Telemetry    Normal sinus rhythm with frequent PACs and short runs of SVT. Rates in the high 80's to low 100's. - Personally Reviewed  ECG    No new ECG tracing today. - Personally  Reviewed  Physical Exam   GEN: No acute distress.   Neck: Supple.  Cardiac: RRR. III/VI systolic murmur heard throughout. No rubs or gallops.  Respiratory: Clear to auscultation bilaterally. No wheezes, rhonchi, or rales. GI: Soft, non-distended, and non-tender. Bowel sounds present. MS: No lower extremity edema. Skin: Warm and dry. Neuro:  No focal deficits.  Psych: Normal affect. Responds appropriately.  Labs    High Sensitivity Troponin:   Recent Labs  Lab 08/02/19 1616 08/02/19 1834  TROPONINIHS 917* 1,251*      Chemistry Recent Labs  Lab 08/01/19 0655 08/02/19 0429 08/03/19 0319  NA 138 137 139  K 5.6* 4.5 3.2*  CL 95* 98 100  CO2 24 22 25   GLUCOSE 95 75 92  BUN 59* 78* 20  CREATININE 7.49* 8.77* 4.29*  CALCIUM 9.2 9.3 8.4*  PROT 5.8*  --   --   ALBUMIN 2.5*  --   --   AST 18  --   --   ALT 10  --   --   ALKPHOS 59  --   --   BILITOT 0.9  --   --   GFRNONAA 5* 4* 11*  GFRAA 6* 5* 12*  ANIONGAP 19* 17* 14     Hematology Recent Labs  Lab 08/02/19  1834 08/03/19 0319 08/03/19 0541  WBC 6.6 6.1 5.7  RBC 2.22* 3.32* 3.18*  HGB 6.5* 10.3* 9.6*  HCT 19.0* 29.2* 27.7*  MCV 85.6 88.0 87.1  MCH 29.3 31.0 30.2  MCHC 34.2 35.3 34.7  RDW 13.2 13.2 13.0  PLT 184 147* 154    BNPNo results for input(s): BNP, PROBNP in the last 168 hours.   DDimer No results for input(s): DDIMER in the last 168 hours.   Radiology    US Abdomen Limited RUQ  Result Date: 08/03/2019 CLINICAL DATA:  Abdominal pain EXAM: ULTRASOUND ABDOMEN LIMITED RIGHT UPPER QUADRANT COMPARISON:  09/29/2018 FINDINGS: Gallbladder: Gallbladder is well distended with small echogenic foci consistent with polyps. No pericholecystic fluid is noted. No cholelithiasis is seen. Common bile duct: Diameter: 5 mm Liver: Generalized increased echogenicity is noted consistent with fatty infiltration. No focal mass is noted. Portal vein is patent on color Doppler imaging with normal direction of blood flow  towards the liver. Other: None. IMPRESSION: Fatty liver. Gallbladder polyps. Electronically Signed   By: Inez Catalina M.D.   On: 08/03/2019 02:47    Cardiac Studies   Echocardiogram 05/14/2018: Study Conclusions: - Left ventricle: The cavity size was normal. Wall thickness was  increased in a pattern of moderate to severe LVH. Systolic  function was normal. The estimated ejection fraction was in the  range of 55% to 60%. Wall motion was normal; there were no  regional wall motion abnormalities. Doppler parameters are  consistent with abnormal left ventricular relaxation (grade 1  diastolic dysfunction).  - Aortic valve: Noncalcified annulus. Probably trileaflet; mildly  calcified leaflets. There was mild to moderate regurgitation.  - Mitral valve: Moderately calcified annulus. There was trivial  regurgitation.  - Left atrium: The atrium was moderately dilated.  - Tricuspid valve: There was trivial regurgitation.  - Pulmonary arteries: Systolic pressure could not be accurately  estimated.  - Pericardium, extracardiac: A trivial pericardial effusion was  identified posterior to the heart.   Patient Profile     59 y.o. female with a history of aortic insufficiency, chronic diastolic CHF, ESRD on hemodialysis, hypertension, hepatitis C, GI bleed, and tobacco abuse who is being ween for evaluation of chest pain at the request of Dr. Ree Kida.  Assessment & Plan    Chest Pain/Demand Ischemia - Patient has a history of chest pain at the termination of hemodialysis. She presented on 08/01/2019 with constant sharp non-radiating chest pain since beginning of hemodialysis.  - EKG showed atrial fibrillation with RVR and LVH. - High-sensitivity troponin 917 >> 1, 251.  - Echo pending.  - Patient currently chest pain free.  - Suspect demand ischemia in setting of hypotension, acute anemia, and new onset atrial fibrillation with underlying ESRD. However, further recommendations  pending Echo results. - No Heparin at this time given acute anemia.  New Onset Atrial Fibrillation with RVR - Back in normal sinus rhythm with frequent PAC and short runs of what looks like SVT (but may be short runs of atrial fibrillation). - Echo pending. - Potassium 3.2 this morning. Given ESRD, will defer supplementation to Nephrology. - Magnesium 1.6 yesterday. Repleted. - Can restart home beta-blocker as BP allows. - CHA2DS2-VASc = 3 (HTN, CHF, and age). No anticoagulation at this time due to acute anemia and GI bleed.  Chronic Diastolic CHF - Most recent Echo from 04/2018 showed EF of 55-60% with normal wall motion, moderate to severe LVH, and grade 1 diastolic dysfunction. - Volume status being managed through hemodialysis.  -  Appears euvolemic on exam today. - Continue to monitor volume status closely.  Aortic Insufficiency - Mild to moderate aortic insufficiency noted on Echo from 04/2018. - Will re-evaluate on repeat Echo.  GI Bleed with Acute Anemia - Hemoglobin dropped to 6.5 on 3/4. S/p 2 units of PRBC. - EGD showed non-bleeding gastric ulcer, hemorrhagic gastropathy, and hematin in the entire stomach. - Hemoglobin stable at 9.6 today. - GI recommended PPI and sucralfate.  Hypotensive - History of hypertension but hypotensive this admission.  - BP still soft but stable this morning. Systolic BP in the 27'X to low 100's. - Continue to hold home Amlodipine and Labetalol.  ESRD on Hemodialysis - Management per Nephrology.  For questions or updates, please contact [place] Please consult [email or web address] for contact info under   History and all data above reviewed.  Patient examined.  I agree with the findings as above.  She feesl much better.  No chest pain.  No SOB. The patient exam reveals COR:RRR, loud systolic murmur, no diastolic  ,  Lungs: Clear  ,  Abd: Positive bowel sounds, no rebound no guarding, Ext No edema  .  All available labs, radiology testing,  previous records reviewed. Agree with documented assessment and plan. Echo: prelim with LVH and good LV function, at least moderate AS.  Waiting for final reading.  Atrial fib:  Obviously not an anticoag candidate.  Rhythm is better now that the anemia is improved.  Elevated trop:  I think this is likely demand ischemia and I would not suggest ischemia work up this admission given the acute bleed.    Jeneen Rinks Monserratt Knezevic  12:19 PM  08/03/2019       Signed, Darreld Mclean, PA-C  08/03/2019, 8:33 AM

## 2019-08-03 NOTE — Progress Notes (Signed)
  Echocardiogram 2D Echocardiogram has been performed.  Michiel Cowboy 08/03/2019, 9:36 AM

## 2019-08-03 NOTE — Progress Notes (Addendum)
Pt with low BP noted, changed location of BP cuff with resulting 82/62, pt asymptomatic.

## 2019-08-03 NOTE — Progress Notes (Signed)
Events of yesterday afternoon noted.  Patient was hypotensive on dialysis and went into new onset atrial fibrillation.    A repeat hemoglobin came back at 6.5, as compared to 8.0 earlier in the day.  She was transfused 2 units and has had an appropriate rise in hemoglobin to a level of 9.6 this morning.   The patient states she had a diarrheal bowel movement last night but did not look at it.  There is no clear history of ongoing hematochezia.  She feels well today.    The patient is back on maximal antipeptic therapy, with a Protonix infusion and 4 times daily sucralfate.    As of this morning, blood pressure remains soft, 82/62, and heart rate is reasonable at 96.  The patient is lying in bed in absolutely no distress.  Her skin is warm, radial pulse is fairly full, she is alert and does not appear at all shocky.  Impression: Stable after apparent bleeding episode yesterday characterized by hypotension and significant drop in hemoglobin, albeit so far without obvious passage of blood from either end.  Plan: Continue aggressive antipeptic therapy.  For now, I would have a fairly high threshold for rescoping this patient; based on the appearance of her ulcers the other day, I think it is fairly unlikely we would find a visible vessel upon which to do an intervention.  Cleotis Nipper, M.D. Pager [phone] If no answer or after 5 PM call [phone]

## 2019-08-04 LAB — RENAL FUNCTION PANEL
Albumin: 2.5 g/dL — ABNORMAL LOW (ref 3.5–5.0)
Anion gap: 13 (ref 5–15)
BUN: 27 mg/dL — ABNORMAL HIGH (ref 6–20)
CO2: 23 mmol/L (ref 22–32)
Calcium: 8.6 mg/dL — ABNORMAL LOW (ref 8.9–10.3)
Chloride: 102 mmol/L (ref 98–111)
Creatinine, Ser: 6.43 mg/dL — ABNORMAL HIGH (ref 0.44–1.00)
GFR calc Af Amer: 8 mL/min — ABNORMAL LOW (ref >60)
GFR calc non Af Amer: 6 mL/min — ABNORMAL LOW (ref >60)
Glucose, Bld: 98 mg/dL (ref 70–99)
Phosphorus: 2.5 mg/dL (ref 2.5–4.6)
Potassium: 3.6 mmol/L (ref 3.5–5.1)
Sodium: 138 mmol/L (ref 135–145)

## 2019-08-04 LAB — CBC
HCT: 29.7 % — ABNORMAL LOW (ref 36.0–46.0)
Hemoglobin: 10.3 g/dL — ABNORMAL LOW (ref 12.0–15.0)
MCH: 30.7 pg (ref 26.0–34.0)
MCHC: 34.7 g/dL (ref 30.0–36.0)
MCV: 88.4 fL (ref 80.0–100.0)
Platelets: 181 10*3/uL (ref 150–400)
RBC: 3.36 MIL/uL — ABNORMAL LOW (ref 3.87–5.11)
RDW: 13.6 % (ref 11.5–15.5)
WBC: 7.4 10*3/uL (ref 4.0–10.5)
nRBC: 0.5 % — ABNORMAL HIGH (ref 0.0–0.2)

## 2019-08-04 MED ORDER — PANTOPRAZOLE SODIUM 40 MG IV SOLR
40.0000 mg | Freq: Two times a day (BID) | INTRAVENOUS | Status: DC
  Administered 2019-08-04 – 2019-08-05 (×2): 40 mg via INTRAVENOUS
  Filled 2019-08-04 (×2): qty 40

## 2019-08-04 NOTE — Progress Notes (Addendum)
[facility] Progress Note   Subjective:   Patient seen on HD. BP improved at present- low 413'K systolic. Patient reports feeling well. Denies dizziness, CP, palpitations, SOB, orthopnea, cough, abdominal pain, N/V/D. Denies any melena this AM.   Objective Vitals:   08/04/19 0726 08/04/19 0740 08/04/19 0800 08/04/19 0830  BP: (!) 91/59 (!) 86/63 100/62 (!) 89/68  Pulse: 99 92 91 97  Resp: 20     Temp: 98.8 F (37.1 C)     TempSrc: Oral     SpO2: 95%     Weight: 53.9 kg     Height:       Physical Exam General: Well developed, thin female. Alert and in NAD Heart: RRR, no murmurs, rubs or gallops Lungs: CTA bilaterally without wheezing,rhonchi or rales Abdomen: Soft, non-tender, non-distended. +BS Extremities: No edema b/l lower extremities Dialysis Access: LUE AVF cannulated  Additional Objective Labs: Basic Metabolic Panel: Recent Labs  Lab 08/02/19 0429 08/03/19 0319 08/04/19 0407  NA 137 139 138  K 4.5 3.2* 3.6  CL 98 100 102  CO2 22 25 23   GLUCOSE 75 92 98  BUN 78* 20 27*  CREATININE 8.77* 4.29* 6.43*  CALCIUM 9.3 8.4* 8.6*  PHOS  --   --  2.5   Liver Function Tests: Recent Labs  Lab 08/01/19 0655 08/04/19 0407  AST 18  --   ALT 10  --   ALKPHOS 59  --   BILITOT 0.9  --   PROT 5.8*  --   ALBUMIN 2.5* 2.5*   CBC: Recent Labs  Lab 08/01/19 0655 08/01/19 1931 08/02/19 1834 08/02/19 1834 08/03/19 0319 08/03/19 0319 08/03/19 0541 08/03/19 1111 08/04/19 0709  WBC 9.1   < > 6.6   < > 6.1   < > 5.7 6.2 7.4  NEUTROABS 7.1  --   --   --   --   --   --   --   --   HGB 9.1*   < > 6.5*   < > 10.3*   < > 9.6* 10.1* 10.3*  HCT 27.8*   < > 19.0*   < > 29.2*   < > 27.7* 28.9* 29.7*  MCV 89.7   < > 85.6  --  88.0  --  87.1 88.9 88.4  PLT 275   < > 184   < > 147*   < > 154 167 181   < > = values in this interval not displayed.   Blood Culture    Component Value Date/Time   SDES BLOOD RIGHT HAND 09/16/2018 0252   SPECREQUEST  09/16/2018  0252    BOTTLES DRAWN AEROBIC ONLY Blood Culture adequate volume   CULT  09/16/2018 0252    NO GROWTH 5 DAYS Performed at [facility], [place] [address], [place], [place] [place] [employer] [address]    REPTSTATUS 09/21/2018 FINAL 09/16/2018 0252    Iron Studies:  Recent Labs    08/03/19 1111  IRON 215*  TIBC 266  FERRITIN 488*    Studies/Results: ECHOCARDIOGRAM COMPLETE  Result Date: 08/03/2019    ECHOCARDIOGRAM REPORT   Patient Name:   Karen Morrow Flannagan Date of Exam: 08/03/2019 Medical Rec #:  [id number]        Height:       67.0 in Accession #:    [id number]       Weight:       114.3 lb Date of Birth:  1960/10/24  BSA:          1.594 m Patient Age:    59 years         BP:           82/62 mmHg Patient Gender: F                HR:           88 bpm. Exam Location:  Inpatient Procedure: 2D Echo, Cardiac Doppler and Color Doppler Indications:    Chest Pain 786.50  History:        Patient has prior history of Echocardiogram examinations, most                 recent 05/14/2018. Cardiomyopathy, Signs/Symptoms:Shortness of                 Breath, Chest Pain and Bacteremia; Risk Factors:Hypertension and                 Current Smoker.  Sonographer:    Vickie Epley RDCS Referring Phys: [id number] [place]  1. Left ventricular ejection fraction, by estimation, is 65 to 70%. The left ventricle has hyperdynamic function. The left ventricle has no regional wall motion abnormalities. There is severe left ventricular hypertrophy that appears concentric. Left ventricular diastolic parameters are consistent with Grade II diastolic dysfunction (pseudonormalization). There is mitral valve systolic anterior motion with significant LV outflow tract turbulence and a gradient that appears to be in the LVOT (and not MR) with peak 130 mmHg.  2. Right ventricular systolic function is normal. The right ventricular size is normal.  3. Left atrial size was moderately dilated.  4. The mitral valve is degenerative. There is  systolic anterior motion of the anterior leaflet. Mild to moderate mitral valve regurgitation. Mild mitral stenosis. The mean mitral valve gradient is 4.0 mmHg. MVA by PHT 2.17 cm^2.  5. The aortic valve is tricuspid. Aortic valve regurgitation is moderate. Mild aortic valve sclerosis is present, with no evidence of aortic valve stenosis.  6. The inferior vena cava is dilated in size with <50% respiratory variability, suggesting right atrial pressure of 15 mmHg.  7. No complete TR doppler jet so unable to estimate PA systolic pressure.  8. Markedly high LVOT gradient with mitral valve SAM and concentric LVH. Possible HCM variant. FINDINGS  Left Ventricle: Left ventricular ejection fraction, by estimation, is 65 to 70%. The left ventricle has hyperdynamic function. The left ventricle has no regional wall motion abnormalities. The left ventricular internal cavity size was normal in size. There is severe left ventricular hypertrophy. Left ventricular diastolic parameters are consistent with Grade II diastolic dysfunction (pseudonormalization). Right Ventricle: The right ventricular size is normal. No increase in right ventricular wall thickness. Right ventricular systolic function is normal. Left Atrium: Left atrial size was moderately dilated. Right Atrium: Right atrial size was normal in size. Pericardium: There is no evidence of pericardial effusion. Mitral Valve: The mitral valve is degenerative in appearance. There is moderate calcification of the mitral valve leaflet(s). Moderate mitral annular calcification. Mild to moderate mitral valve regurgitation. Mild mitral valve stenosis. The mean mitral valve gradient is 4.0 mmHg. Tricuspid Valve: The tricuspid valve is normal in structure. Tricuspid valve regurgitation is not demonstrated. Aortic Valve: The aortic valve is tricuspid. Aortic valve regurgitation is moderate. Aortic regurgitation PHT measures 628 msec. Mild aortic valve sclerosis is present, with no  evidence of aortic valve stenosis. Pulmonic Valve: The pulmonic valve was normal in structure. Pulmonic  valve regurgitation is not visualized. Aorta: The aortic root is normal in size and structure. Venous: The inferior vena cava is dilated in size with less than 50% respiratory variability, suggesting right atrial pressure of 15 mmHg. IAS/Shunts: No atrial level shunt detected by color flow Doppler.  LEFT VENTRICLE PLAX 2D LVIDd:         3.30 cm  Diastology LVIDs:         2.40 cm  LV e' lateral:   3.50 cm/s LV PW:         1.40 cm  LV E/e' lateral: 40.0 LV IVS:        1.40 cm  LV e' medial:    3.59 cm/s LVOT diam:     2.00 cm  LV E/e' medial:  39.0 LVOT Area:     3.14 cm  RIGHT VENTRICLE RV S prime:     16.80 cm/s TAPSE (M-mode): 1.6 cm LEFT ATRIUM             Index       RIGHT ATRIUM           Index LA diam:        3.80 cm 2.38 cm/m  RA Area:     12.80 cm LA Vol (A2C):   67.6 ml 42.40 ml/m RA Volume:   29.10 ml  18.25 ml/m LA Vol (A4C):   80.3 ml 50.37 ml/m LA Biplane Vol: 74.4 ml 46.67 ml/m  AORTIC VALVE AI PHT:      628 msec  AORTA Ao Root diam: 3.10 cm MITRAL VALVE MV Area (PHT): 2.26 cm     SHUNTS MV Mean grad:  4.0 mmHg     Systemic Diam: 2.00 cm MV Decel Time: 335 msec MR Peak grad: 147.9 mmHg MR Vmax:      608.00 cm/s MV E velocity: 140.00 cm/s MV A velocity: 143.00 cm/s MV E/A ratio:  0.98 Loralie Champagne MD Electronically signed by Loralie Champagne MD Signature Date/Time: 08/03/2019/12:39:12 PM    Final    US Abdomen Limited RUQ  Result Date: 08/03/2019 CLINICAL DATA:  Abdominal pain EXAM: ULTRASOUND ABDOMEN LIMITED RIGHT UPPER QUADRANT COMPARISON:  09/29/2018 FINDINGS: Gallbladder: Gallbladder is well distended with small echogenic foci consistent with polyps. No pericholecystic fluid is noted. No cholelithiasis is seen. Common bile duct: Diameter: 5 mm Liver: Generalized increased echogenicity is noted consistent with fatty infiltration. No focal mass is noted. Portal vein is patent on color Doppler  imaging with normal direction of blood flow towards the liver. Other: None. IMPRESSION: Fatty liver. Gallbladder polyps. Electronically Signed   By: Inez Catalina M.D.   On: 08/03/2019 02:47   Medications: . pantoprozole (PROTONIX) infusion 8 mg/hr (08/04/19 0622)   . sodium chloride   Intravenous Once  . sodium chloride   Intravenous Once  . cinacalcet  30 mg Oral Q supper  . darbepoetin (ARANESP) injection - DIALYSIS  40 mcg Intravenous Q Thu-HD  . ferric citrate  420 mg Oral TID WC  . gabapentin  100 mg Oral QHS  . sodium chloride flush  3 mL Intravenous Q12H  . sucralfate  1 g Oral TID WC & HS    [facility], 3:45hr, BFR 350/ DFR 600, EDW 51kg, 2K/2.5Ca, 16g needles hectorol 3 mcg IV q HD Lokelma 10g on non-HD days Auryxia 2 tabs TID with meals (pt reports taking "blue and white pill" which is likely calcium acetate)  Assessment/Plan: 1. Acute blood loss anemia 2/2  GI bleed: coffee ground emesis x2d. Hgb 9.1on admit, baseline 13-14. EGD suggestive of hemorrhagic gastritis and gastric ulcer. s/p 2units pRBC on 3/4. Hgb 7.8 > 8.0>10.3>9.6, now stable in 10's. Pt denies further bleeding.  Per primary/GI. Continue aranesp q Thursday.  2. New onset A fib - Cardiology consulting. Back to NSR w/short runs SVT or Afib.  ECHO pending.  Need to try to keep K^ during HD, plan to use increased K bath while on HD.  3. Chest pain - thought to be due to demand ischemia - Cardio following. CP resolved today.  4. ESRD:TTS schedule- HD today per regular schedule with Raliegh Ip bath.  5. Hyperkalemia: K+ 5.6on presentation, hypokalemic on 3/5 and given 20 mEq KCl, improved to 3.6 today. Added K+ bath with HD. 6. Hypertension/volume:BP soft, home BP meds held. Above her EDW but is euvolemic on exam. Minimal to no UF with HD. 7. Metabolic bone disease:Corrected calcium 9.8, high earlier in admission. Was taking calcium acetate instead of  auryxia. Phos currently on the low side- 2.5. Will hold binders for now. Restart Hectorol.  8. Nutrition:Renal diet with fluidrestrictions.  9. Chronic diastolic CHF - cardiology consulting  Anice Paganini, PA-C 08/04/2019, 8:48 AM  [facility] Pager: [phone]  Nephrology attending: Patient was seen and examined at dialysis unit.  Chart reviewed.  I agree with assessment and plan as outlined above.  Patient reported her bleeding has stopped.  Hemoglobin is stable.  Received multiple blood transfusion.  Echo was done.  Denies chest pain, shortness of breath today.  Tolerating dialysis well.  Holding binders.  Continue ESA.  Okay to discharge from renal perspective.  Katheran James, MD [facility].

## 2019-08-04 NOTE — Plan of Care (Signed)

## 2019-08-04 NOTE — Progress Notes (Signed)
Pt feels well, although not that hungry.  States she's been up and walking, denies dizziness or SOB from that.  Thinks she had a BM today, not sure if it had blood.  Hgb stable today 10.3, 2 days s/p apparent post-admission bleed (characterized by hypotension and drop in hgb without overt clinical bleed.  PLAN:  1. [place] for dischg tomorrow if hgb stable. 2. Would dischg on twice-daily PPI forever (was already on that at time of adm, but wasn't taking it consistently). 3. Doesn't need sucralfate after dischg. 4. We will contact pt to arr f/u egd to confirm ulcer healing in about 2 mos. 5.  Will sign off; call if questions or if we can be of further assistance with this patient.  Cleotis Nipper, M.D. Pager [phone] If no answer or after 5 PM call [phone]

## 2019-08-04 NOTE — Plan of Care (Signed)
  Problem: Education: Goal: Knowledge of General Education information will improve Description: Including pain rating scale, medication(s)/side effects and non-pharmacologic comfort measures Outcome: Progressing   Problem: Health Behavior/Discharge Planning: Goal: Ability to manage health-related needs will improve Outcome: Progressing   Problem: Clinical Measurements: Goal: Ability to maintain clinical measurements within normal limits will improve Outcome: Progressing   Problem: Activity: Goal: Risk for activity intolerance will decrease Outcome: Progressing   Problem: Coping: Goal: Level of anxiety will decrease Outcome: Progressing   Problem: Pain Managment: Goal: General experience of comfort will improve Outcome: Progressing   Elesa Hacker, RN

## 2019-08-04 NOTE — Progress Notes (Signed)
Progress Note  Patient Name: Karen Morrow Date of Encounter: 08/04/2019  Primary Cardiologist: Quay Burow, MD   Subjective   Patient is in HD this AM  Inpatient Medications    Scheduled Meds: . sodium chloride   Intravenous Once  . sodium chloride   Intravenous Once  . cinacalcet  30 mg Oral Q supper  . darbepoetin (ARANESP) injection - DIALYSIS  40 mcg Intravenous Q Thu-HD  . ferric citrate  420 mg Oral TID WC  . gabapentin  100 mg Oral QHS  . sodium chloride flush  3 mL Intravenous Q12H  . sucralfate  1 g Oral TID WC & HS   Continuous Infusions: . pantoprozole (PROTONIX) infusion 8 mg/hr (08/04/19 0622)   PRN Meds: acetaminophen **OR** acetaminophen, albuterol, calcium carbonate (dosed in mg elemental calcium), camphor-menthol **AND** hydrOXYzine, docusate sodium, feeding supplement (NEPRO CARB STEADY), ondansetron **OR** ondansetron (ZOFRAN) IV, sorbitol, zolpidem   Vital Signs    Vitals:   08/04/19 0740 08/04/19 0800 08/04/19 0830 08/04/19 0900  BP: (!) 86/63 100/62 (!) 89/68 (!) 89/66  Pulse: 92 91 97 91  Resp:      Temp:      TempSrc:      SpO2:      Weight:      Height:       No intake or output data in the 24 hours ending 08/04/19 0938 Last 3 Weights 08/04/2019 08/04/2019 08/03/2019  Weight (lbs) 118 lb 12.8 oz 116 lb 4.8 oz 114 lb 4.8 oz  Weight (kg) 53.887 kg 52.753 kg 51.846 kg      Telemetry    n/a - Personally Reviewed  ECG    n/a - Personally Reviewed  Physical Exam  Exam deferred, patient in HD this morning  Labs    High Sensitivity Troponin:   Recent Labs  Lab 08/02/19 1616 08/02/19 1834  TROPONINIHS 917* 1,251*      Chemistry Recent Labs  Lab 08/01/19 0655 08/01/19 0655 08/02/19 0429 08/03/19 0319 08/04/19 0407  NA 138   < > 137 139 138  K 5.6*   < > 4.5 3.2* 3.6  CL 95*   < > 98 100 102  CO2 24   < > 22 25 23   GLUCOSE 95   < > 75 92 98  BUN 59*   < > 78* 20 27*  CREATININE 7.49*   < > 8.77* 4.29* 6.43*  CALCIUM  9.2   < > 9.3 8.4* 8.6*  PROT 5.8*  --   --   --   --   ALBUMIN 2.5*  --   --   --  2.5*  AST 18  --   --   --   --   ALT 10  --   --   --   --   ALKPHOS 59  --   --   --   --   BILITOT 0.9  --   --   --   --   GFRNONAA 5*   < > 4* 11* 6*  GFRAA 6*   < > 5* 12* 8*  ANIONGAP 19*   < > 17* 14 13   < > = values in this interval not displayed.     Hematology Recent Labs  Lab 08/03/19 0541 08/03/19 0706 08/03/19 1111 08/04/19 0709  WBC 5.7  --  6.2 7.4  RBC 3.18* 3.13* 3.25* 3.36*  HGB 9.6*  --  10.1* 10.3*  HCT 27.7*  --  28.9* 29.7*  MCV 87.1  --  88.9 88.4  MCH 30.2  --  31.1 30.7  MCHC 34.7  --  34.9 34.7  RDW 13.0  --  13.3 13.6  PLT 154  --  167 181    BNPNo results for input(s): BNP, PROBNP in the last 168 hours.   DDimer No results for input(s): DDIMER in the last 168 hours.   Radiology    ECHOCARDIOGRAM COMPLETE  Result Date: 08/03/2019    ECHOCARDIOGRAM REPORT   Patient Name:   Karen Morrow Date of Exam: 08/03/2019 Medical Rec #:  [id number]        Height:       67.0 in Accession #:    [id number]       Weight:       114.3 lb Date of Birth:  [birth year]        BSA:          1.594 m Patient Age:    76 years         BP:           82/62 mmHg Patient Gender: F                HR:           88 bpm. Exam Location:  Inpatient Procedure: 2D Echo, Cardiac Doppler and Color Doppler Indications:    Chest Pain 786.50  History:        Patient has prior history of Echocardiogram examinations, most                 recent 05/14/2018. Cardiomyopathy, Signs/Symptoms:Shortness of                 Breath, Chest Pain and Bacteremia; Risk Factors:Hypertension and                 Current Smoker.  Sonographer:    Vickie Epley RDCS Referring Phys: [id number] [place]  1. Left ventricular ejection fraction, by estimation, is 65 to 70%. The left ventricle has hyperdynamic function. The left ventricle has no regional wall motion abnormalities. There is severe left ventricular hypertrophy  that appears concentric. Left ventricular diastolic parameters are consistent with Grade II diastolic dysfunction (pseudonormalization). There is mitral valve systolic anterior motion with significant LV outflow tract turbulence and a gradient that appears to be in the LVOT (and not MR) with peak 130 mmHg.  2. Right ventricular systolic function is normal. The right ventricular size is normal.  3. Left atrial size was moderately dilated.  4. The mitral valve is degenerative. There is systolic anterior motion of the anterior leaflet. Mild to moderate mitral valve regurgitation. Mild mitral stenosis. The mean mitral valve gradient is 4.0 mmHg. MVA by PHT 2.17 cm^2.  5. The aortic valve is tricuspid. Aortic valve regurgitation is moderate. Mild aortic valve sclerosis is present, with no evidence of aortic valve stenosis.  6. The inferior vena cava is dilated in size with <50% respiratory variability, suggesting right atrial pressure of 15 mmHg.  7. No complete TR doppler jet so unable to estimate PA systolic pressure.  8. Markedly high LVOT gradient with mitral valve SAM and concentric LVH. Possible HCM variant. FINDINGS  Left Ventricle: Left ventricular ejection fraction, by estimation, is 65 to 70%. The left ventricle has hyperdynamic function. The left ventricle has no regional wall motion abnormalities. The left ventricular internal cavity size was normal in size. There is severe left ventricular hypertrophy. Left ventricular  diastolic parameters are consistent with Grade II diastolic dysfunction (pseudonormalization). Right Ventricle: The right ventricular size is normal. No increase in right ventricular wall thickness. Right ventricular systolic function is normal. Left Atrium: Left atrial size was moderately dilated. Right Atrium: Right atrial size was normal in size. Pericardium: There is no evidence of pericardial effusion. Mitral Valve: The mitral valve is degenerative in appearance. There is moderate  calcification of the mitral valve leaflet(s). Moderate mitral annular calcification. Mild to moderate mitral valve regurgitation. Mild mitral valve stenosis. The mean mitral valve gradient is 4.0 mmHg. Tricuspid Valve: The tricuspid valve is normal in structure. Tricuspid valve regurgitation is not demonstrated. Aortic Valve: The aortic valve is tricuspid. Aortic valve regurgitation is moderate. Aortic regurgitation PHT measures 628 msec. Mild aortic valve sclerosis is present, with no evidence of aortic valve stenosis. Pulmonic Valve: The pulmonic valve was normal in structure. Pulmonic valve regurgitation is not visualized. Aorta: The aortic root is normal in size and structure. Venous: The inferior vena cava is dilated in size with less than 50% respiratory variability, suggesting right atrial pressure of 15 mmHg. IAS/Shunts: No atrial level shunt detected by color flow Doppler.  LEFT VENTRICLE PLAX 2D LVIDd:         3.30 cm  Diastology LVIDs:         2.40 cm  LV e' lateral:   3.50 cm/s LV PW:         1.40 cm  LV E/e' lateral: 40.0 LV IVS:        1.40 cm  LV e' medial:    3.59 cm/s LVOT diam:     2.00 cm  LV E/e' medial:  39.0 LVOT Area:     3.14 cm  RIGHT VENTRICLE RV S prime:     16.80 cm/s TAPSE (M-mode): 1.6 cm LEFT ATRIUM             Index       RIGHT ATRIUM           Index LA diam:        3.80 cm 2.38 cm/m  RA Area:     12.80 cm LA Vol (A2C):   67.6 ml 42.40 ml/m RA Volume:   29.10 ml  18.25 ml/m LA Vol (A4C):   80.3 ml 50.37 ml/m LA Biplane Vol: 74.4 ml 46.67 ml/m  AORTIC VALVE AI PHT:      628 msec  AORTA Ao Root diam: 3.10 cm MITRAL VALVE MV Area (PHT): 2.26 cm     SHUNTS MV Mean grad:  4.0 mmHg     Systemic Diam: 2.00 cm MV Decel Time: 335 msec MR Peak grad: 147.9 mmHg MR Vmax:      608.00 cm/s MV E velocity: 140.00 cm/s MV A velocity: 143.00 cm/s MV E/A ratio:  0.98 Loralie Champagne MD Electronically signed by Loralie Champagne MD Signature Date/Time: 08/03/2019/12:39:12 PM    Final    US Abdomen  Limited RUQ  Result Date: 08/03/2019 CLINICAL DATA:  Abdominal pain EXAM: ULTRASOUND ABDOMEN LIMITED RIGHT UPPER QUADRANT COMPARISON:  09/29/2018 FINDINGS: Gallbladder: Gallbladder is well distended with small echogenic foci consistent with polyps. No pericholecystic fluid is noted. No cholelithiasis is seen. Common bile duct: Diameter: 5 mm Liver: Generalized increased echogenicity is noted consistent with fatty infiltration. No focal mass is noted. Portal vein is patent on color Doppler imaging with normal direction of blood flow towards the liver. Other: None. IMPRESSION: Fatty liver. Gallbladder polyps. Electronically Signed   By: Inez Catalina  M.D.   On: 08/03/2019 02:47    Cardiac Studies    Patient Profile     59 y.o. female with a history of aortic insufficiency, chronic diastolic CHF, ESRD on hemodialysis, hypertension, hepatitis C, GI bleed, and tobacco abuse who is being ween for evaluation of chest pain at the request of Dr. Ree Kida.  Assessment & Plan    1. Chest pain/Demand ischemia -  prior history of chest pain associated with HD presented on 08/01/2019 with constant sharp non-radiating chest pain since beginning of hemodialysis.  - EKG showed atrial fibrillation with RVR and LVH. - High-sensitivity troponin 917 >> 1, 251. - echo LVEF 65-70%, no WMAs, severe LVH, grade II DDX, SAM with intracavitary gradient 165mmHg, mild to mod MR  - trop elevation in setting of afib with RVR, severe LVH, dynamic LVOT gradient, severe anemia.  - echo with hyerpdynamic LV, no WMAs - treat medically, with alternative explantations for demand ischemia and GI bleed would not plan for ischemic tesitng.   2. Dynamic LVOT gradient - echo LVEF 65-70%, no WMAs, severe LVH, grade II DDX, SAM with intracavitary gradient 156mmHg, mild to mod MR - would suspect this is symmetrical hypertrophy in ESRD/HTN patient with hyperdyanmic LV in setting of anemia and systemic stress. HOCM variant I think less  likely. Could explain her chest pains typically after HD, when her preload is the lowest. Would consider perhaps higher dry weight for patient with HD given dynamic LVOT gradient. In general treatment is higher preloads and beta blockers   - ongoing soft bp's, would start beta blocker when able to     2. Afib with RVR - new diagnosis this admission - converted back to SR on her own - not an anticoag candidate due to GI bleed  - ongoing soft bp's, would start beta blocker when able to    3. Chronic diastolic HF - fluid management her HD   4. GI bleed with acute anemia - Hgb dropped to 6.4 this admission, received 2 units pRBCs - EGD showed non-bleeding gastric ulcer, hemorrhagic gastropathy, and hematin in the entire stomach.  5. Hypotension - no cardiac etiology based on echo, LV is hyperdynamic.    Patient is in HD this AM, exam had been deferred.   For questions or updates, please contact [place] Please consult [email or web address] for contact info under        Signed, Carlyle Dolly, MD  08/04/2019, 9:38 AM

## 2019-08-04 NOTE — Progress Notes (Signed)
PROGRESS NOTE    Karen Morrow  HCW:[id number] DOB: January 27, 1961 DOA: 08/01/2019 PCP: [name], MD   Brief Narrative:  HPI On 08/01/2019 by Dr. Karmen Bongo Karen Morrow is a 59 y.o. female with medical history significant of HTN; Hep C; ESRD on TTS HD; afib; and CHF presenting with n/v/d with hematemesis.  She reports vomiting blood x 2 days.  Also with diarrhea with blood.  She is getting increasingly weak, can't even walk.  She thinks this also happened to her last year in May.  She doesn't remember anything about that admission.  She denies abdominal pain.  She is no longer having hematemesis but it was black; also with black stools.  She is not on blood thinners, "I am on nothing."  No fever.  She was able to complete her entire session yesterday, but she had fecal incontinence there with dark stools.  HD did not send her to the hospital, she came in on her own.  Interim history Admitted with GI bleed, s/p EGD and blood transfusion. GI wants to watch for additional night to trend hemoglobin given dark stools overnight. Assessment & Plan   Upper GI bleed/symptomatic anemia/acute on chronic normocytic Anemia -Patient presented with lightheadedness and fatigue; FOBT + -Gastroenterology consulted and appreciated -Patient has had multiple EGDs -EGD on 08/01/2019: Patient noted to have no active bleeding or fresh blood in stomach, coffee-ground noted suggestive of hemorrhagic gastritis.  Moderately large, cratered gastric ulcer with a slightly dirty base but no visible vessel or adherent clot.  Small gastric ulcer with clean base and a great deal of prepyloric mucosal edema and prominent folds. -Discussed with gastroenterology, Dr. Cristina Morrow, would like to continue to monitor hemoglobin overnight given continued dark stools and EGD findings. -hemoglobin dropped to 6.5, transfused 2u PRBCs on 08/02/2019 -placed patient on protonix drip -hemoglobin today 10.3 -continue to monitor  CBC -pending anemia panel  New onset atrial fibrillation -currently in sinus rhythm -Echocardiogram: EF 65 to 70%, LV hyperdynamic function.  No regional wall motion abnormalities.  Grade 2 diastolic dysfunction.  -possibly related to the above -magnesium up to 2.3 (after replacement) -Cardiology consulted and appreciated -TSH WNL (1.235)  Chest pain/Elevated troponin  -Troponin peaked to 1251- possible due to demand ischemia -No further reports of chest pain -As above, cardiology consulted and appreciated -Echocardiogram pending  Hypotension/Hemorrhagic shock -Patient with SBPs as low as 50-60 -mental status clear -likely due to anemia/GI Bleed -home meds, labetalol and amlodipine held -PCCM consulted and appreciated -BP improving  ESRD -Patient dialyzes on Tuesday, Thursday, Saturday -Nephrology consulted and appreciated -Continue PhosLo, Sensipar, Nephro-Vite  Hyperkalemia/Hypokalemia -Resolved, continue to monitor  Chronic diastolic CHF  -Echocardiogram shows grade 2 diastolic dysfunction -currently appears to be euvolemic and compensated -volume status controlled with HD (however during HD on 3/4, no fluid pulled off)  Chronic hepatitis C -Patient with elevated quant and in April 2020 -Does not appear to have outpatient follow-up, hopefully will continue to follow-up with gastroenterology  Essential hypertension -BPs are currently soft, likely associated with anemia and GI bleeding -BP medications, amlodipine and labetalol currently held -Continue to monitor closely  Fatty liver/Gallbladder polyps -noted on Abdominal US  Dynamic LVOT gradent -Echocardiogram noted there is a mitral valve systolic anterior motion with significant LV outflow tract turbulence and a gradient that appears to be in the LVOT with peak of 130 mmHg. -Per cardiology, would consider perhaps a higher drier weight for patient with HD given her dynamic LVOT gradient.  In general treatment  is higher preload and beta-blockers. -Start beta-blocker when able however patient currently has hypotension.  DVT Prophylaxis  SCDs  Code Status: Full  Family Communication: None at bedside  Disposition Plan: Admitted from home for anemia and GI bleed.  Developed elevated troponin level with chest pain as well as new onset atrial fibrillation.  Cardiology consulted. Patient also with continued drop in hemoglobin, received 2 units PRBC.   Suspect home when stable likely within 1 to 2 days.  Consultants Gastroenterology  Nephrology Cardiology PCCM  Procedures  EGD Echocardiogram  Antibiotics   Anti-infectives (From admission, onward)   None      Subjective:   Karen Morrow seen and examined today.  Seen in dialysis.  Currently denies any chest pain, shortness breath, abdominal pain, nausea or vomiting, diarrhea constipation, dizziness or headache.  States she did have some dark stools yesterday. Objective:   Vitals:   08/04/19 1030 08/04/19 1100 08/04/19 1200 08/04/19 1300  BP: 94/69 (!) 85/67    Pulse: 98 (!) 55    Resp:   (!) 21 19  Temp:      TempSrc:      SpO2:      Weight:      Height:        Intake/Output Summary (Last 24 hours) at 08/04/2019 1430 Last data filed at 08/04/2019 1226 Gross per 24 hour  Intake --  Output 1 ml  Net -1 ml   Filed Weights   08/03/19 0328 08/04/19 0558 08/04/19 0726  Weight: 51.8 kg 52.8 kg 53.9 kg   Exam  General: Well developed, well nourished, NAD, appears stated age  105: NCAT, mucous membranes moist.   Cardiovascular: S1 S2 auscultated, 3/6 SEM, regular  Respiratory: Clear to auscultation bilaterally  Abdomen: Soft, nontender, nondistended, + bowel sounds  Extremities: warm dry without cyanosis clubbing or edema  Neuro: AAOx3, nonfocal  Psych: Appropriate mood and affect, pleasant   Data Reviewed: I have personally reviewed following labs and imaging studies  CBC: Recent Labs  Lab 08/01/19 0655  08/01/19 1931 08/02/19 1834 08/03/19 0319 08/03/19 0541 08/03/19 1111 08/04/19 0709  WBC 9.1   < > 6.6 6.1 5.7 6.2 7.4  NEUTROABS 7.1  --   --   --   --   --   --   HGB 9.1*   < > 6.5* 10.3* 9.6* 10.1* 10.3*  HCT 27.8*   < > 19.0* 29.2* 27.7* 28.9* 29.7*  MCV 89.7   < > 85.6 88.0 87.1 88.9 88.4  PLT 275   < > 184 147* 154 167 181   < > = values in this interval not displayed.   Basic Metabolic Panel: Recent Labs  Lab 08/01/19 0655 08/02/19 0429 08/02/19 1834 08/03/19 0319 08/03/19 1111 08/04/19 0407  NA 138 137  --  139  --  138  K 5.6* 4.5  --  3.2*  --  3.6  CL 95* 98  --  100  --  102  CO2 24 22  --  25  --  23  GLUCOSE 95 75  --  92  --  98  BUN 59* 78*  --  20  --  27*  CREATININE 7.49* 8.77*  --  4.29*  --  6.43*  CALCIUM 9.2 9.3  --  8.4*  --  8.6*  MG  --   --  1.6*  --  2.3  --   PHOS  --   --   --   --   --  2.5   GFR: Estimated Creatinine Clearance: 8 mL/min (A) (by C-G formula based on SCr of 6.43 mg/dL (H)). Liver Function Tests: Recent Labs  Lab 08/01/19 0655 08/04/19 0407  AST 18  --   ALT 10  --   ALKPHOS 59  --   BILITOT 0.9  --   PROT 5.8*  --   ALBUMIN 2.5* 2.5*   No results for input(s): LIPASE, AMYLASE in the last 168 hours. No results for input(s): AMMONIA in the last 168 hours. Coagulation Profile: Recent Labs  Lab 08/01/19 0701  INR 1.3*   Cardiac Enzymes: No results for input(s): CKTOTAL, CKMB, CKMBINDEX, TROPONINI in the last 168 hours. BNP (last 3 results) No results for input(s): PROBNP in the last 8760 hours. HbA1C: No results for input(s): HGBA1C in the last 72 hours. CBG: No results for input(s): GLUCAP in the last 168 hours. Lipid Profile: No results for input(s): CHOL, HDL, LDLCALC, TRIG, CHOLHDL, LDLDIRECT in the last 72 hours. Thyroid Function Tests: Recent Labs    08/02/19 1834  TSH 1.235   Anemia Panel: Recent Labs    08/03/19 0706 08/03/19 1111  VITAMINB12  --  275  FOLATE  --  4.7*  FERRITIN  --   488*  TIBC  --  266  IRON  --  215*  RETICCTPCT 2.2  --    Urine analysis:    Component Value Date/Time   COLORURINE YELLOW 11/26/2013 1819   APPEARANCEUR TURBID (A) 11/26/2013 1819   LABSPEC 1.021 11/26/2013 1819   PHURINE 5.5 11/26/2013 1819   GLUCOSEU NEGATIVE 11/26/2013 1819   HGBUR SMALL (A) 11/26/2013 1819   BILIRUBINUR NEGATIVE 11/26/2013 1819   KETONESUR NEGATIVE 11/26/2013 1819   PROTEINUR >300 (A) 11/26/2013 1819   UROBILINOGEN 0.2 11/26/2013 1819   NITRITE POSITIVE (A) 11/26/2013 1819   LEUKOCYTESUR MODERATE (A) 11/26/2013 1819   Sepsis Labs: @LABRCNTIP (procalcitonin:4,lacticidven:4)  ) Recent Results (from the past 240 hour(s))  Respiratory Panel by RT PCR (Flu A&B, Covid) - Nasopharyngeal Swab     Status: None   Collection Time: 08/01/19  9:03 AM   Specimen: Nasopharyngeal Swab  Result Value Ref Range Status   SARS Coronavirus 2 by RT PCR NEGATIVE NEGATIVE Final    Comment: (NOTE) SARS-CoV-2 target nucleic acids are NOT DETECTED. The SARS-CoV-2 RNA is generally detectable in upper respiratoy specimens during the acute phase of infection. The lowest concentration of SARS-CoV-2 viral copies this assay can detect is 131 copies/mL. A negative result does not preclude SARS-Cov-2 infection and should not be used as the sole basis for treatment or other patient management decisions. A negative result may occur with  improper specimen collection/handling, submission of specimen other than nasopharyngeal swab, presence of viral mutation(s) within the areas targeted by this assay, and inadequate number of viral copies (<131 copies/mL). A negative result must be combined with clinical observations, patient history, and epidemiological information. The expected result is Negative. Fact Sheet for Patients:  PinkCheek.be Fact Sheet for Healthcare Providers:  GravelBags.it This test is not yet ap proved or cleared  by the [place] FDA and  has been authorized for detection and/or diagnosis of SARS-CoV-2 by FDA under an Emergency Use Authorization (EUA). This EUA will remain  in effect (meaning this test can be used) for the duration of the COVID-19 declaration under Section 564(b)(1) of the Act, 21 U.S.C. section 360bbb-3(b)(1), unless the authorization is terminated or revoked sooner.    Influenza A by PCR NEGATIVE NEGATIVE Final   Influenza  B by PCR NEGATIVE NEGATIVE Final    Comment: (NOTE) The Xpert Xpress SARS-CoV-2/FLU/RSV assay is intended as an aid in  the diagnosis of influenza from Nasopharyngeal swab specimens and  should not be used as a sole basis for treatment. Nasal washings and  aspirates are unacceptable for Xpert Xpress SARS-CoV-2/FLU/RSV  testing. Fact Sheet for Patients: PinkCheek.be Fact Sheet for Healthcare Providers: GravelBags.it This test is not yet approved or cleared by the [place] FDA and  has been authorized for detection and/or diagnosis of SARS-CoV-2 by  FDA under an Emergency Use Authorization (EUA). This EUA will remain  in effect (meaning this test can be used) for the duration of the  Covid-19 declaration under Section 564(b)(1) of the Act, 21  U.S.C. section 360bbb-3(b)(1), unless the authorization is  terminated or revoked. Performed at [facility], [place] [address] [place], [place] [address]       Radiology Studies: ECHOCARDIOGRAM COMPLETE  Result Date: 08/03/2019    ECHOCARDIOGRAM REPORT   Patient Name:   Karen Morrow Date of Exam: 08/03/2019 Medical Rec #:  [id number]        Height:       67.0 in Accession #:    [id number]       Weight:       114.3 lb Date of Birth:  13-Jul-1960        BSA:          1.594 m Patient Age:    68 years         BP:           82/62 mmHg Patient Gender: F                HR:           88 bpm. Exam Location:  Inpatient Procedure: 2D Echo, Cardiac Doppler  and Color Doppler Indications:    Chest Pain 786.50  History:        Patient has prior history of Echocardiogram examinations, most                 recent 05/14/2018. Cardiomyopathy, Signs/Symptoms:Shortness of                 Breath, Chest Pain and Bacteremia; Risk Factors:Hypertension and                 Current Smoker.  Sonographer:    Vickie Epley RDCS Referring Phys: [id number] [place]  1. Left ventricular ejection fraction, by estimation, is 65 to 70%. The left ventricle has hyperdynamic function. The left ventricle has no regional wall motion abnormalities. There is severe left ventricular hypertrophy that appears concentric. Left ventricular diastolic parameters are consistent with Grade II diastolic dysfunction (pseudonormalization). There is mitral valve systolic anterior motion with significant LV outflow tract turbulence and a gradient that appears to be in the LVOT (and not MR) with peak 130 mmHg.  2. Right ventricular systolic function is normal. The right ventricular size is normal.  3. Left atrial size was moderately dilated.  4. The mitral valve is degenerative. There is systolic anterior motion of the anterior leaflet. Mild to moderate mitral valve regurgitation. Mild mitral stenosis. The mean mitral valve gradient is 4.0 mmHg. MVA by PHT 2.17 cm^2.  5. The aortic valve is tricuspid. Aortic valve regurgitation is moderate. Mild aortic valve sclerosis is present, with no evidence of aortic valve stenosis.  6. The inferior vena cava is dilated in size with <50%  respiratory variability, suggesting right atrial pressure of 15 mmHg.  7. No complete TR doppler jet so unable to estimate PA systolic pressure.  8. Markedly high LVOT gradient with mitral valve SAM and concentric LVH. Possible HCM variant. FINDINGS  Left Ventricle: Left ventricular ejection fraction, by estimation, is 65 to 70%. The left ventricle has hyperdynamic function. The left ventricle has no regional wall motion  abnormalities. The left ventricular internal cavity size was normal in size. There is severe left ventricular hypertrophy. Left ventricular diastolic parameters are consistent with Grade II diastolic dysfunction (pseudonormalization). Right Ventricle: The right ventricular size is normal. No increase in right ventricular wall thickness. Right ventricular systolic function is normal. Left Atrium: Left atrial size was moderately dilated. Right Atrium: Right atrial size was normal in size. Pericardium: There is no evidence of pericardial effusion. Mitral Valve: The mitral valve is degenerative in appearance. There is moderate calcification of the mitral valve leaflet(s). Moderate mitral annular calcification. Mild to moderate mitral valve regurgitation. Mild mitral valve stenosis. The mean mitral valve gradient is 4.0 mmHg. Tricuspid Valve: The tricuspid valve is normal in structure. Tricuspid valve regurgitation is not demonstrated. Aortic Valve: The aortic valve is tricuspid. Aortic valve regurgitation is moderate. Aortic regurgitation PHT measures 628 msec. Mild aortic valve sclerosis is present, with no evidence of aortic valve stenosis. Pulmonic Valve: The pulmonic valve was normal in structure. Pulmonic valve regurgitation is not visualized. Aorta: The aortic root is normal in size and structure. Venous: The inferior vena cava is dilated in size with less than 50% respiratory variability, suggesting right atrial pressure of 15 mmHg. IAS/Shunts: No atrial level shunt detected by color flow Doppler.  LEFT VENTRICLE PLAX 2D LVIDd:         3.30 cm  Diastology LVIDs:         2.40 cm  LV e' lateral:   3.50 cm/s LV PW:         1.40 cm  LV E/e' lateral: 40.0 LV IVS:        1.40 cm  LV e' medial:    3.59 cm/s LVOT diam:     2.00 cm  LV E/e' medial:  39.0 LVOT Area:     3.14 cm  RIGHT VENTRICLE RV S prime:     16.80 cm/s TAPSE (M-mode): 1.6 cm LEFT ATRIUM             Index       RIGHT ATRIUM           Index LA diam:         3.80 cm 2.38 cm/m  RA Area:     12.80 cm LA Vol (A2C):   67.6 ml 42.40 ml/m RA Volume:   29.10 ml  18.25 ml/m LA Vol (A4C):   80.3 ml 50.37 ml/m LA Biplane Vol: 74.4 ml 46.67 ml/m  AORTIC VALVE AI PHT:      628 msec  AORTA Ao Root diam: 3.10 cm MITRAL VALVE MV Area (PHT): 2.26 cm     SHUNTS MV Mean grad:  4.0 mmHg     Systemic Diam: 2.00 cm MV Decel Time: 335 msec MR Peak grad: 147.9 mmHg MR Vmax:      608.00 cm/s MV E velocity: 140.00 cm/s MV A velocity: 143.00 cm/s MV E/A ratio:  0.98 Loralie Champagne MD Electronically signed by Loralie Champagne MD Signature Date/Time: 08/03/2019/12:39:12 PM    Final    US Abdomen Limited RUQ  Result Date: 08/03/2019 CLINICAL DATA:  Abdominal  pain EXAM: ULTRASOUND ABDOMEN LIMITED RIGHT UPPER QUADRANT COMPARISON:  09/29/2018 FINDINGS: Gallbladder: Gallbladder is well distended with small echogenic foci consistent with polyps. No pericholecystic fluid is noted. No cholelithiasis is seen. Common bile duct: Diameter: 5 mm Liver: Generalized increased echogenicity is noted consistent with fatty infiltration. No focal mass is noted. Portal vein is patent on color Doppler imaging with normal direction of blood flow towards the liver. Other: None. IMPRESSION: Fatty liver. Gallbladder polyps. Electronically Signed   By: Inez Catalina M.D.   On: 08/03/2019 02:47     Scheduled Meds: . sodium chloride   Intravenous Once  . sodium chloride   Intravenous Once  . cinacalcet  30 mg Oral Q supper  . darbepoetin (ARANESP) injection - DIALYSIS  40 mcg Intravenous Q Thu-HD  . ferric citrate  420 mg Oral TID WC  . gabapentin  100 mg Oral QHS  . sodium chloride flush  3 mL Intravenous Q12H  . sucralfate  1 g Oral TID WC & HS   Continuous Infusions:    LOS: 2 days   Time Spent in minutes   45 minutes  Ignacio Lowder D.O. on 08/04/2019 at 2:30 PM  Between 7am to 7pm - Please see pager noted on [email or web address]  After 7pm go to [email or web address]  And look for the night coverage person  covering for me after hours  [facility] Office  [phone]

## 2019-08-05 DIAGNOSIS — I480 Paroxysmal atrial fibrillation: Secondary | ICD-10-CM

## 2019-08-05 LAB — HEMOGLOBIN AND HEMATOCRIT, BLOOD
HCT: 29 % — ABNORMAL LOW (ref 36.0–46.0)
Hemoglobin: 9.9 g/dL — ABNORMAL LOW (ref 12.0–15.0)

## 2019-08-05 LAB — BASIC METABOLIC PANEL
Anion gap: 11 (ref 5–15)
BUN: 8 mg/dL (ref 6–20)
CO2: 26 mmol/L (ref 22–32)
Calcium: 8.2 mg/dL — ABNORMAL LOW (ref 8.9–10.3)
Chloride: 98 mmol/L (ref 98–111)
Creatinine, Ser: 4.2 mg/dL — ABNORMAL HIGH (ref 0.44–1.00)
GFR calc Af Amer: 13 mL/min — ABNORMAL LOW (ref >60)
GFR calc non Af Amer: 11 mL/min — ABNORMAL LOW (ref >60)
Glucose, Bld: 91 mg/dL (ref 70–99)
Potassium: 3.9 mmol/L (ref 3.5–5.1)
Sodium: 135 mmol/L (ref 135–145)

## 2019-08-05 MED ORDER — METOPROLOL TARTRATE 12.5 MG HALF TABLET
12.5000 mg | ORAL_TABLET | Freq: Two times a day (BID) | ORAL | Status: DC
  Administered 2019-08-05: 12.5 mg via ORAL
  Filled 2019-08-05: qty 1

## 2019-08-05 MED ORDER — METOPROLOL TARTRATE 25 MG PO TABS: 12.5000 mg | ORAL_TABLET | Freq: Two times a day (BID) | ORAL | 0 refills | Status: DC

## 2019-08-05 MED ORDER — NEPRO/CARBSTEADY PO LIQD: 237.0000 mL | Freq: Three times a day (TID) | ORAL | 0 refills | Status: AC | PRN

## 2019-08-05 MED ORDER — PANTOPRAZOLE SODIUM 40 MG PO TBEC: 40.0000 mg | DELAYED_RELEASE_TABLET | Freq: Two times a day (BID) | ORAL | Status: DC

## 2019-08-05 NOTE — Plan of Care (Signed)

## 2019-08-05 NOTE — Progress Notes (Addendum)
[facility] Progress Note   Subjective:   Pt seen in room. Brief run of tachycardia on monitor, asymptomatic. Pt denies CP, palpitations, abdominal pain, N/V/D, SOB, fever/chills.   Objective Vitals:   08/04/19 1200 08/04/19 1300 08/04/19 2140 08/05/19 0650  BP:   114/75 118/85  Pulse:   100 100  Resp: (!) 21 19 18    Temp:   98 F (36.7 C) 97.6 F (36.4 C)  TempSrc:   Axillary Oral  SpO2:   100% 97%  Weight:    52.9 kg  Height:       Physical Exam General:Well developed female. Alert and in NAD Heart:Regular rhythm, briefly tachycardic. No murmurs, rubs or gallops auscultated.  Lungs:CTA bilaterally without wheezing,rhonchi or rales Abdomen:Soft, non-tender, non-distended. +BS Extremities:No edema b/l lower extremities Dialysis Access:LUE AVF + bruit   Additional Objective Labs: Basic Metabolic Panel: Recent Labs  Lab 08/03/19 0319 08/04/19 0407 08/05/19 0343  NA 139 138 135  K 3.2* 3.6 3.9  CL 100 102 98  CO2 25 23 26   GLUCOSE 92 98 91  BUN 20 27* 8  CREATININE 4.29* 6.43* 4.20*  CALCIUM 8.4* 8.6* 8.2*  PHOS  --  2.5  --    Liver Function Tests: Recent Labs  Lab 08/01/19 0655 08/04/19 0407  AST 18  --   ALT 10  --   ALKPHOS 59  --   BILITOT 0.9  --   PROT 5.8*  --   ALBUMIN 2.5* 2.5*   CBC: Recent Labs  Lab 08/01/19 0655 08/01/19 1931 08/02/19 1834 08/02/19 1834 08/03/19 0319 08/03/19 0319 08/03/19 0541 08/03/19 0541 08/03/19 1111 08/04/19 0709 08/05/19 0343  WBC 9.1   < > 6.6   < > 6.1   < > 5.7  --  6.2 7.4  --   NEUTROABS 7.1  --   --   --   --   --   --   --   --   --   --   HGB 9.1*   < > 6.5*   < > 10.3*   < > 9.6*   < > 10.1* 10.3* 9.9*  HCT 27.8*   < > 19.0*   < > 29.2*   < > 27.7*   < > 28.9* 29.7* 29.0*  MCV 89.7   < > 85.6  --  88.0  --  87.1  --  88.9 88.4  --   PLT 275   < > 184   < > 147*   < > 154  --  167 181  --    < > = values in this interval not displayed.   Blood Culture    Component Value  Date/Time   SDES BLOOD RIGHT HAND 09/16/2018 0252   SPECREQUEST  09/16/2018 0252    BOTTLES DRAWN AEROBIC ONLY Blood Culture adequate volume   CULT  09/16/2018 0252    NO GROWTH 5 DAYS Performed at [facility], [place] [address] [place], [place] [address]    REPTSTATUS 09/21/2018 FINAL 09/16/2018 0252    Iron Studies:  Recent Labs    08/03/19 1111  IRON 215*  TIBC 266  FERRITIN 488*    Studies/Results: ECHOCARDIOGRAM COMPLETE  Result Date: 08/03/2019    ECHOCARDIOGRAM REPORT   Patient Name:   Karen Morrow Date of Exam: 08/03/2019 Medical Rec #:  [id number]        Height:       67.0 in Accession #:  [id number]       Weight:       114.3 lb Date of Birth:  [birth year]        BSA:          1.594 m Patient Age:    59 years         BP:           82/62 mmHg Patient Gender: F                HR:           88 bpm. Exam Location:  Inpatient Procedure: 2D Echo, Cardiac Doppler and Color Doppler Indications:    Chest Pain 786.50  History:        Patient has prior history of Echocardiogram examinations, most                 recent 05/14/2018. Cardiomyopathy, Signs/Symptoms:Shortness of                 Breath, Chest Pain and Bacteremia; Risk Factors:Hypertension and                 Current Smoker.  Sonographer:    Vickie Epley RDCS Referring Phys: [id number] [place]  1. Left ventricular ejection fraction, by estimation, is 65 to 70%. The left ventricle has hyperdynamic function. The left ventricle has no regional wall motion abnormalities. There is severe left ventricular hypertrophy that appears concentric. Left ventricular diastolic parameters are consistent with Grade II diastolic dysfunction (pseudonormalization). There is mitral valve systolic anterior motion with significant LV outflow tract turbulence and a gradient that appears to be in the LVOT (and not MR) with peak 130 mmHg.  2. Right ventricular systolic function is normal. The right ventricular size is normal.  3. Left  atrial size was moderately dilated.  4. The mitral valve is degenerative. There is systolic anterior motion of the anterior leaflet. Mild to moderate mitral valve regurgitation. Mild mitral stenosis. The mean mitral valve gradient is 4.0 mmHg. MVA by PHT 2.17 cm^2.  5. The aortic valve is tricuspid. Aortic valve regurgitation is moderate. Mild aortic valve sclerosis is present, with no evidence of aortic valve stenosis.  6. The inferior vena cava is dilated in size with <50% respiratory variability, suggesting right atrial pressure of 15 mmHg.  7. No complete TR doppler jet so unable to estimate PA systolic pressure.  8. Markedly high LVOT gradient with mitral valve SAM and concentric LVH. Possible HCM variant. FINDINGS  Left Ventricle: Left ventricular ejection fraction, by estimation, is 65 to 70%. The left ventricle has hyperdynamic function. The left ventricle has no regional wall motion abnormalities. The left ventricular internal cavity size was normal in size. There is severe left ventricular hypertrophy. Left ventricular diastolic parameters are consistent with Grade II diastolic dysfunction (pseudonormalization). Right Ventricle: The right ventricular size is normal. No increase in right ventricular wall thickness. Right ventricular systolic function is normal. Left Atrium: Left atrial size was moderately dilated. Right Atrium: Right atrial size was normal in size. Pericardium: There is no evidence of pericardial effusion. Mitral Valve: The mitral valve is degenerative in appearance. There is moderate calcification of the mitral valve leaflet(s). Moderate mitral annular calcification. Mild to moderate mitral valve regurgitation. Mild mitral valve stenosis. The mean mitral valve gradient is 4.0 mmHg. Tricuspid Valve: The tricuspid valve is normal in structure. Tricuspid valve regurgitation is not demonstrated. Aortic Valve: The aortic valve is tricuspid. Aortic valve regurgitation is moderate. Aortic  regurgitation PHT measures 628 msec. Mild aortic valve sclerosis is present, with no evidence of aortic valve stenosis. Pulmonic Valve: The pulmonic valve was normal in structure. Pulmonic valve regurgitation is not visualized. Aorta: The aortic root is normal in size and structure. Venous: The inferior vena cava is dilated in size with less than 50% respiratory variability, suggesting right atrial pressure of 15 mmHg. IAS/Shunts: No atrial level shunt detected by color flow Doppler.  LEFT VENTRICLE PLAX 2D LVIDd:         3.30 cm  Diastology LVIDs:         2.40 cm  LV e' lateral:   3.50 cm/s LV PW:         1.40 cm  LV E/e' lateral: 40.0 LV IVS:        1.40 cm  LV e' medial:    3.59 cm/s LVOT diam:     2.00 cm  LV E/e' medial:  39.0 LVOT Area:     3.14 cm  RIGHT VENTRICLE RV S prime:     16.80 cm/s TAPSE (M-mode): 1.6 cm LEFT ATRIUM             Index       RIGHT ATRIUM           Index LA diam:        3.80 cm 2.38 cm/m  RA Area:     12.80 cm LA Vol (A2C):   67.6 ml 42.40 ml/m RA Volume:   29.10 ml  18.25 ml/m LA Vol (A4C):   80.3 ml 50.37 ml/m LA Biplane Vol: 74.4 ml 46.67 ml/m  AORTIC VALVE AI PHT:      628 msec  AORTA Ao Root diam: 3.10 cm MITRAL VALVE MV Area (PHT): 2.26 cm     SHUNTS MV Mean grad:  4.0 mmHg     Systemic Diam: 2.00 cm MV Decel Time: 335 msec MR Peak grad: 147.9 mmHg MR Vmax:      608.00 cm/s MV E velocity: 140.00 cm/s MV A velocity: 143.00 cm/s MV E/A ratio:  0.98 Loralie Champagne MD Electronically signed by Loralie Champagne MD Signature Date/Time: 08/03/2019/12:39:12 PM    Final    Medications:  . sodium chloride   Intravenous Once  . sodium chloride   Intravenous Once  . cinacalcet  30 mg Oral Q supper  . darbepoetin (ARANESP) injection - DIALYSIS  40 mcg Intravenous Q Thu-HD  . ferric citrate  420 mg Oral TID WC  . gabapentin  100 mg Oral QHS  . pantoprazole (PROTONIX) IV  40 mg Intravenous Q12H  . sodium chloride flush  3 mL Intravenous Q12H  . sucralfate  1 g Oral TID WC & HS     [facility], 3:45hr, BFR 350/ DFR 600, EDW 51kg, 2K/2.5Ca, 16g needles hectorol 3 mcg IV q HD Lokelma 10g on non-HD days Auryxia 2 tabs TID with meals (pt reports taking "blue and white pill" which is likely calcium acetate)  Assessment/Plan: 1. Acute blood loss anemia 2/2 GI bleed: coffee ground emesisx2d.Hgb 9.1on admit,baseline 13-14. EGD suggestive of hemorrhagic gastritis and gastric ulcer.s/p 2units pRBC on 3/4.Hgb 7.8 >>9.9. Pt denies further bleeding.Per primary/GI. Continue aranesp q Thursday.  2. Chest pain/demand ischemia: CP on 3/3 during HD. EKG showed a. Fib RVR, high sensitivty troponin 917 > 1251. Cardiology consulted, echo showed EF 65-70%, severe LVH, SAM, mild-mod MR. Dynamic LVOT gradient. Thought to be demand ischemia. Cardiology recommending higher dry weight to  increase preload and beta blockers when BP improved, appreciate their recommendations. Will increase EDW at discharge.  3. A. Fib RVR: noted during HD 3/3, cardiology on board. Not on beta blocker due to hypotension, not a candidate for anticoagulation. Needs higher K+ given a. Fib. 4. ESRD:TTS schedule, next HD 3/9, can be done as outpatient. 5. Hyperkalemia: K+ 5.6on presentation, hypokalemic on 3/5 and given 20 mEq KCl, improved to 3.9 today.Added K+ bath with HD. 6. Hypotension/volume:BP soft, home BP meds held. Above her EDW but is euvolemic on exam. Will raise EDW.  7. Metabolic bone disease:Corrected calcium 9.4, high earlier in admission. Was taking calcium acetate instead of auryxia. Phos currently on the low side- 2.5. Will hold binders for now, can resume at discharge when patient resumes her regular diet.Restart Hectorol. 8. Nutrition:Renaldiet with fluidrestrictions.   Anice Paganini, PA-C 08/05/2019, 8:10 AM  [facility] Pager: [phone]  Patient was seen and examined at bedside.  Chart  reviewed.  I agree with assessment and plan as outlined above.  ESRD on HD admitted with acute blood loss anemia due to GI bleed.  Received blood transfusion.  EGD suggestive of gastric ulcer and hemorrhagic gastropathy.  Biopsies were sent.  Continue PPI.  Status post HD yesterday, tolerated well.  Plan for next HD 3/9.  Okay to discharge from renal perspective and follow-up at outpatient dialysis center.  Katheran James, MD [facility].

## 2019-08-05 NOTE — Progress Notes (Signed)
Progress Note  Patient Name: Karen Morrow Date of Encounter: 08/05/2019  Primary Cardiologist: Quay Burow, MD   Subjective   No complaints  Inpatient Medications    Scheduled Meds: . sodium chloride   Intravenous Once  . sodium chloride   Intravenous Once  . cinacalcet  30 mg Oral Q supper  . darbepoetin (ARANESP) injection - DIALYSIS  40 mcg Intravenous Q Thu-HD  . ferric citrate  420 mg Oral TID WC  . gabapentin  100 mg Oral QHS  . pantoprazole (PROTONIX) IV  40 mg Intravenous Q12H  . sodium chloride flush  3 mL Intravenous Q12H  . sucralfate  1 g Oral TID WC & HS   Continuous Infusions:  PRN Meds: acetaminophen **OR** acetaminophen, albuterol, calcium carbonate (dosed in mg elemental calcium), camphor-menthol **AND** hydrOXYzine, docusate sodium, feeding supplement (NEPRO CARB STEADY), ondansetron **OR** ondansetron (ZOFRAN) IV, sorbitol, zolpidem   Vital Signs    Vitals:   08/04/19 1200 08/04/19 1300 08/04/19 2140 08/05/19 0650  BP:   114/75 118/85  Pulse:   100 100  Resp: (!) 21 19 18    Temp:   98 F (36.7 C) 97.6 F (36.4 C)  TempSrc:   Axillary Oral  SpO2:   100% 97%  Weight:    52.9 kg  Height:        Intake/Output Summary (Last 24 hours) at 08/05/2019 0906 Last data filed at 08/05/2019 7588 Gross per 24 hour  Intake 563 ml  Output -188 ml  Net 751 ml   Last 3 Weights 08/05/2019 08/04/2019 08/04/2019  Weight (lbs) 116 lb 9.6 oz 118 lb 9.7 oz 118 lb 12.8 oz  Weight (kg) 52.889 kg 53.8 kg 53.887 kg      Telemetry    SR, short runs of SVT - Personally Reviewed  ECG    n/a - Personally Reviewed  Physical Exam   GEN: No acute distress.   Neck: No JVD Cardiac: RRR, 3/6 systolic murmur rusb, 3/6 systolic murmur apex Respiratory: Clear to auscultation bilaterally. GI: Soft, nontender, non-distended  MS: No edema; No deformity. Neuro:  Nonfocal  Psych: Normal affect   Labs    High Sensitivity Troponin:   Recent Labs  Lab 08/02/19 1616  08/02/19 1834  TROPONINIHS 917* 1,251*      Chemistry Recent Labs  Lab 08/01/19 0655 08/02/19 0429 08/03/19 0319 08/04/19 0407 08/05/19 0343  NA 138   < > 139 138 135  K 5.6*   < > 3.2* 3.6 3.9  CL 95*   < > 100 102 98  CO2 24   < > 25 23 26   GLUCOSE 95   < > 92 98 91  BUN 59*   < > 20 27* 8  CREATININE 7.49*   < > 4.29* 6.43* 4.20*  CALCIUM 9.2   < > 8.4* 8.6* 8.2*  PROT 5.8*  --   --   --   --   ALBUMIN 2.5*  --   --  2.5*  --   AST 18  --   --   --   --   ALT 10  --   --   --   --   ALKPHOS 59  --   --   --   --   BILITOT 0.9  --   --   --   --   GFRNONAA 5*   < > 11* 6* 11*  GFRAA 6*   < > 12* 8* 13*  ANIONGAP  19*   < > 14 13 11    < > = values in this interval not displayed.     Hematology Recent Labs  Lab 08/03/19 0541 08/03/19 0541 08/03/19 0706 08/03/19 1111 08/04/19 0709 08/05/19 0343  WBC 5.7  --   --  6.2 7.4  --   RBC 3.18*  --  3.13* 3.25* 3.36*  --   HGB 9.6*   < >  --  10.1* 10.3* 9.9*  HCT 27.7*   < >  --  28.9* 29.7* 29.0*  MCV 87.1  --   --  88.9 88.4  --   MCH 30.2  --   --  31.1 30.7  --   MCHC 34.7  --   --  34.9 34.7  --   RDW 13.0  --   --  13.3 13.6  --   PLT 154  --   --  167 181  --    < > = values in this interval not displayed.    BNPNo results for input(s): BNP, PROBNP in the last 168 hours.   DDimer No results for input(s): DDIMER in the last 168 hours.   Radiology    ECHOCARDIOGRAM COMPLETE  Result Date: 08/03/2019    ECHOCARDIOGRAM REPORT   Patient Name:   Karen Morrow Date of Exam: 08/03/2019 Medical Rec #:  [id number]        Height:       67.0 in Accession #:    [id number]       Weight:       114.3 lb Date of Birth:  1961-01-05        BSA:          1.594 m Patient Age:    59 years         BP:           82/62 mmHg Patient Gender: F                HR:           88 bpm. Exam Location:  Inpatient Procedure: 2D Echo, Cardiac Doppler and Color Doppler Indications:    Chest Pain 786.50  History:        Patient has prior history  of Echocardiogram examinations, most                 recent 05/14/2018. Cardiomyopathy, Signs/Symptoms:Shortness of                 Breath, Chest Pain and Bacteremia; Risk Factors:Hypertension and                 Current Smoker.  Sonographer:    Vickie Epley RDCS Referring Phys: [id number] [place]  1. Left ventricular ejection fraction, by estimation, is 65 to 70%. The left ventricle has hyperdynamic function. The left ventricle has no regional wall motion abnormalities. There is severe left ventricular hypertrophy that appears concentric. Left ventricular diastolic parameters are consistent with Grade II diastolic dysfunction (pseudonormalization). There is mitral valve systolic anterior motion with significant LV outflow tract turbulence and a gradient that appears to be in the LVOT (and not MR) with peak 130 mmHg.  2. Right ventricular systolic function is normal. The right ventricular size is normal.  3. Left atrial size was moderately dilated.  4. The mitral valve is degenerative. There is systolic anterior motion of the anterior leaflet. Mild to moderate mitral valve regurgitation. Mild mitral stenosis. The mean mitral valve gradient  is 4.0 mmHg. MVA by PHT 2.17 cm^2.  5. The aortic valve is tricuspid. Aortic valve regurgitation is moderate. Mild aortic valve sclerosis is present, with no evidence of aortic valve stenosis.  6. The inferior vena cava is dilated in size with <50% respiratory variability, suggesting right atrial pressure of 15 mmHg.  7. No complete TR doppler jet so unable to estimate PA systolic pressure.  8. Markedly high LVOT gradient with mitral valve SAM and concentric LVH. Possible HCM variant. FINDINGS  Left Ventricle: Left ventricular ejection fraction, by estimation, is 65 to 70%. The left ventricle has hyperdynamic function. The left ventricle has no regional wall motion abnormalities. The left ventricular internal cavity size was normal in size. There is severe left  ventricular hypertrophy. Left ventricular diastolic parameters are consistent with Grade II diastolic dysfunction (pseudonormalization). Right Ventricle: The right ventricular size is normal. No increase in right ventricular wall thickness. Right ventricular systolic function is normal. Left Atrium: Left atrial size was moderately dilated. Right Atrium: Right atrial size was normal in size. Pericardium: There is no evidence of pericardial effusion. Mitral Valve: The mitral valve is degenerative in appearance. There is moderate calcification of the mitral valve leaflet(s). Moderate mitral annular calcification. Mild to moderate mitral valve regurgitation. Mild mitral valve stenosis. The mean mitral valve gradient is 4.0 mmHg. Tricuspid Valve: The tricuspid valve is normal in structure. Tricuspid valve regurgitation is not demonstrated. Aortic Valve: The aortic valve is tricuspid. Aortic valve regurgitation is moderate. Aortic regurgitation PHT measures 628 msec. Mild aortic valve sclerosis is present, with no evidence of aortic valve stenosis. Pulmonic Valve: The pulmonic valve was normal in structure. Pulmonic valve regurgitation is not visualized. Aorta: The aortic root is normal in size and structure. Venous: The inferior vena cava is dilated in size with less than 50% respiratory variability, suggesting right atrial pressure of 15 mmHg. IAS/Shunts: No atrial level shunt detected by color flow Doppler.  LEFT VENTRICLE PLAX 2D LVIDd:         3.30 cm  Diastology LVIDs:         2.40 cm  LV e' lateral:   3.50 cm/s LV PW:         1.40 cm  LV E/e' lateral: 40.0 LV IVS:        1.40 cm  LV e' medial:    3.59 cm/s LVOT diam:     2.00 cm  LV E/e' medial:  39.0 LVOT Area:     3.14 cm  RIGHT VENTRICLE RV S prime:     16.80 cm/s TAPSE (M-mode): 1.6 cm LEFT ATRIUM             Index       RIGHT ATRIUM           Index LA diam:        3.80 cm 2.38 cm/m  RA Area:     12.80 cm LA Vol (A2C):   67.6 ml 42.40 ml/m RA Volume:    29.10 ml  18.25 ml/m LA Vol (A4C):   80.3 ml 50.37 ml/m LA Biplane Vol: 74.4 ml 46.67 ml/m  AORTIC VALVE AI PHT:      628 msec  AORTA Ao Root diam: 3.10 cm MITRAL VALVE MV Area (PHT): 2.26 cm     SHUNTS MV Mean grad:  4.0 mmHg     Systemic Diam: 2.00 cm MV Decel Time: 335 msec MR Peak grad: 147.9 mmHg MR Vmax:      608.00 cm/s MV E  velocity: 140.00 cm/s MV A velocity: 143.00 cm/s MV E/A ratio:  0.98 Loralie Champagne MD Electronically signed by Loralie Champagne MD Signature Date/Time: 08/03/2019/12:39:12 PM    Final     Cardiac Studies    Patient Profile     59 y.o.femalewith a history of aortic insufficiency, chronic diastolic CHF, ESRD on hemodialysis, hypertension, hepatitis C, GI bleed, and tobacco abuse who is being ween for evaluation of chest pain at the request of Dr. Ree Kida.  Assessment & Plan    1. Chest pain/Demand ischemia -  prior history of chest pain associated with HD presented on 08/01/2019 with constant sharp non-radiating chest pain since beginning of hemodialysis.  - EKG showed atrial fibrillation with RVR and LVH. - High-sensitivity troponin 917 >>1, 251. - echo LVEF 65-70%, no WMAs, severe LVH, grade II DDX, SAM with intracavitary gradient 131mmHg, mild to mod MR  - trop elevation in setting of afib with RVR, severe LVH, dynamic LVOT gradient, severe anemia.  - echo with hyerpdynamic LV, no WMAs - treat medically, with alternative explantations for demand ischemia and GI bleed would not plan for ischemic tesitng.   2. Dynamic LVOT gradient - echo LVEF 65-70%, no WMAs, severe LVH, grade II DDX, SAM with intracavitary gradient 144mmHg, mild to mod MR - would suspect this is symmetrical hypertrophy in ESRD/HTN patient with hyperdyanmic LV in setting of anemia and systemic stress. HOCM variant I think less likely. Could explain her chest pains typically after HD, when her preload is the lowest. Would consider perhaps higher dry weight for patient with HD given dynamic LVOT  gradient. In general treatment is higher preload and beta blockers   - bp's have improved, start lopressor 12.5mg  bid.     2. Afib with RVR - new diagnosis this admission - converted back to SR on her own - not an anticoag candidate due to GI bleed  - bp's are improving, start lopressor 12.5mg  bid.    3. Chronic diastolic HF - fluid management her HD   4. GI bleed with acute anemia - Hgb dropped to 6.4 this admission, received 2 units pRBCs - EGD showed non-bleeding gastric ulcer, hemorrhagic gastropathy, and hematin in the entire stomach. - per GI   Ok for d/c from cardiac standpoint, we will sign off inpatient care. We will arrange outpatient f/u  For questions or updates, please contact [place] Please consult [email or web address] for contact info under        Signed, Carlyle Dolly, MD  08/05/2019, 9:06 AM

## 2019-08-05 NOTE — Discharge Summary (Signed)
Physician Discharge Summary  KYONA CHAUNCEY TOI:[id number] DOB: 12-01-1960 DOA: 08/01/2019  PCP: [name], MD  Admit date: 08/01/2019 Discharge date: 08/05/2019  Time spent: 45 minutes  Recommendations for Outpatient Follow-up:  Patient will be discharged to home.  Patient will need to follow up with primary care provider within one week of discharge.  Follow-up with gastroenterology, cardiology and nephrology.  Continue hemodialysis as scheduled.  Patient should continue medications as prescribed.  Patient should follow a renal diet with 1200 mL fluid restriction per day.    Discharge Diagnoses:  Upper GI bleed/symptomatic anemia/acute on chronic normocyticAnemia New onset atrial fibrillation Chest pain/Elevated troponin  Hypotension/Hemorrhagic shock ESRD Hyperkalemia/Hypokalemia Chronic diastolic CHF  Chronic hepatitis C Essential hypertension Fatty liver/Gallbladder polyps Dynamic LVOT gradent  Discharge Condition: Stable  Diet recommendation: No diet, 1200 mL fluid restriction per day  Filed Weights   08/04/19 0726 08/04/19 1112 08/05/19 0650  Weight: 53.9 kg 53.8 kg 52.9 kg    History of present illness:  On 08/01/2019 by Dr. Newman Nip Nettlesis a 59 y.o.femalewith medical history significant ofHTN; Hep C; ESRD onTTSHD; afib; and CHF presenting with n/v/d with hematemesis.She reports vomiting blood x 2 days. Also with diarrhea with blood. She is getting increasingly weak, can't even walk. She thinks this also happened to her last year in May. She doesn't remember anything about that admission. She denies abdominal pain. She is no longer having hematemesis but it was black; also with black stools. She is not on blood thinners, "I am on nothing." No fever. She was able to complete her entire session yesterday, but she had fecal incontinence there with dark stools. HD did not send her to the hospital, she came in on her own.  Hospital  Course:  Upper GI bleed/symptomatic anemia/acute on chronic normocyticAnemia -Patient presented with lightheadedness and fatigue; FOBT + -Gastroenterology consulted and appreciated -Patient has had multiple EGDs -EGD on 08/01/2019: Patient noted to have no active bleeding or fresh blood in stomach, coffee-ground noted suggestive of hemorrhagic gastritis.  Moderately large, cratered gastric ulcer with a slightly dirty base but no visible vessel or adherent clot.  Small gastric ulcer with clean base and a great deal of prepyloric mucosal edema and prominent folds. -Discussed with gastroenterology, Dr. Cristina Gong, would like to continue to monitor hemoglobin overnight given continued dark stools and EGD findings. -hemoglobin dropped to 6.5, transfused 2u PRBCs on 08/02/2019 -Continue Protonix 40 mg IV twice daily -hemoglobin today 9.9 -Anemia panel shows adequate iron and storage -Patient will need to follow-up with gastroenterology on discharge  New onset atrial fibrillation -currently in sinus rhythm -Echocardiogram: EF 65 to 70%, LV hyperdynamic function.  No regional wall motion abnormalities.  Grade 2 diastolic dysfunction.  -possibly related to the above -magnesium up to 2.3 (after replacement) -Cardiology consulted and appreciated- not a candidate for anticoagulation given GIB -TSH WNL (1.235) -Patient to follow-up with cardiology upon discharge  Chest pain/Elevated troponin  -Troponin peaked to 1251- possible due to demand ischemia -No further reports of chest pain -As above, cardiology consulted and appreciated -Echocardiogram as above  Hypotension/Hemorrhagic shock -Patient with SBPs as low as 50-60 -mental status clear -likely due to anemia/GI Bleed -home meds, labetalol and amlodipine held -PCCM consulted and appreciated -BP improving  ESRD -Patient dialyzes on Tuesday, Thursday, Saturday -Nephrology consulted and appreciated -Continue PhosLo, Sensipar,  Nephro-Vite  Hyperkalemia/Hypokalemia -Resolved, continue to monitor  Chronic diastolic CHF  -Echocardiogram shows grade 2 diastolic dysfunction -currently appears to be euvolemic  and compensated -volume status controlled with HD (however during HD on 3/4, no fluid pulled off)  Chronic hepatitis C -Patient with elevated quant and in April 2020 -Does not appear to have outpatient follow-up, hopefully will continue to follow-up with gastroenterology  Essential hypertension -BPs are currently soft, likely associated with anemia and GI bleeding -BP medications, amlodipine and labetalol currently held  Fatty liver/Gallbladder polyps -noted on Abdominal US  Dynamic LVOT gradent -Echocardiogram noted there is a mitral valve systolic anterior motion with significant LV outflow tract turbulence and a gradient that appears to be in the LVOT with peak of 130 mmHg. -Per cardiology, would consider perhaps a higher drier weight for patient with HD given her dynamic LVOT gradient.  In general treatment is higher preload and beta-blockers. -Start beta-blocker when able however patient currently has hypotension.  Consultants Gastroenterology  Nephrology Cardiology PCCM  Procedures  EGD Echocardiogram  Discharge Exam: Vitals:   08/05/19 1000 08/05/19 1100  BP:    Pulse:    Resp: (!) 22 20  Temp:    SpO2:       General: Well developed, well nourished, NAD, appears stated age  76: NCAT, mucous membranes moist.  Cardiovascular: S1 S2 auscultated, 3/6 SEM, regular  Respiratory: Clear to auscultation bilaterally with equal chest rise  Abdomen: Soft, nontender, nondistended, + bowel sounds  Extremities: warm dry without cyanosis clubbing or edema  Neuro: AAOx3, nonfocal  Psych: Pleasant, appropriate mood and affect  Discharge Instructions Discharge Instructions    Discharge instructions   Complete by: As directed    Patient will be discharged to home.  Patient  will need to follow up with primary care provider within one week of discharge.  Follow-up with gastroenterology, cardiology and nephrology.  Continue hemodialysis as scheduled.  Patient should continue medications as prescribed.  Patient should follow a renal diet with 1200 mL fluid restriction per day.     Allergies as of 08/05/2019      Reactions   Cefazolin Other (See Comments)   Severe thrombocytopenia (has tolerated zosyn in the past) ID aware Tolerated in October 2019   Cephalosporins Anaphylaxis   Tuberculin Swelling      Medication List    STOP taking these medications   amLODipine 10 MG tablet Commonly known as: NORVASC   feeding supplement (PRO-STAT SUGAR FREE 64) Liqd   labetalol 200 MG tablet Commonly known as: NORMODYNE     TAKE these medications   acetaminophen 500 MG tablet Commonly known as: TYLENOL Take 500-1,000 mg by mouth every 6 (six) hours as needed for headache (pain).   albuterol 108 (90 Base) MCG/ACT inhaler Commonly known as: VENTOLIN HFA TAKE 2 PUFFS BY MOUTH EVERY 6 HOURS AS NEEDED FOR WHEEZE OR SHORTNESS OF BREATH What changed: See the new instructions.   Auryxia 1 GM 210 MG(Fe) tablet Generic drug: ferric citrate Take 420 mg by mouth 3 (three) times daily with meals.   b complex-vitamin c-folic acid 0.8 MG Tabs tablet Take 1 tablet by mouth every evening.   calcium acetate 667 MG capsule Commonly known as: PHOSLO Take 1,334 mg by mouth 3 (three) times daily.   cinacalcet 30 MG tablet Commonly known as: SENSIPAR TAKE 1 TABLET BY MOUTH DAILY EVERY EVENING (DO NOT TAKE LESS THAN 12 HOURS PRIOR TO DIALYSIS) What changed:   how much to take  how to take this  when to take this  additional instructions   feeding supplement (NEPRO CARB STEADY) Liqd Take 237 mLs by mouth  3 (three) times daily as needed (Supplement).   metoprolol tartrate 25 MG tablet Commonly known as: LOPRESSOR Take 0.5 tablets (12.5 mg total) by mouth 2 (two) times  daily.   pantoprazole 40 MG tablet Commonly known as: PROTONIX Take 1 tablet (40 mg total) by mouth 2 (two) times daily.      Allergies  Allergen Reactions  . Cefazolin Other (See Comments)    Severe thrombocytopenia (has tolerated zosyn in the past) ID aware Tolerated in October 2019  . Cephalosporins Anaphylaxis  . Tuberculin Swelling   Follow-up Information    Erlene Quan, PA-C Follow up on 08/28/2019.   Specialties: Cardiology, Radiology Why: 8:45 AM - Dr. Kennon Holter PA Contact information: [place] [address] [phone]        [name], MD. Schedule an appointment as soon as possible for a visit in 1 week(s).   Specialty: Internal Medicine Why: Hospital follow up Contact information: [place] [address] [phone]        Lorretta Harp, MD .   Specialties: Cardiology, Radiology Contact information: [address] [place] [address] [phone]        Ronald Lobo, MD. Schedule an appointment as soon as possible for a visit in 2 week(s).   Specialty: Gastroenterology Why: Hospital follow up Contact information: [address] [place] [address] [phone]            The results of significant diagnostics from this hospitalization (including imaging, microbiology, ancillary and laboratory) are listed below for reference.    Significant Diagnostic Studies: ECHOCARDIOGRAM COMPLETE  Result Date: 08/03/2019    ECHOCARDIOGRAM REPORT   Patient Name:   ALYZAE HAWKEY Vitali Date of Exam: 08/03/2019 Medical Rec #:  [id number]        Height:       67.0 in Accession #:    [id number]       Weight:       114.3 lb Date of Birth:  1960/08/16        BSA:          1.594 m Patient Age:    27 years         BP:           82/62 mmHg Patient Gender: F                HR:           88 bpm. Exam Location:  Inpatient Procedure: 2D Echo, Cardiac Doppler and Color Doppler  Indications:    Chest Pain 786.50  History:        Patient has prior history of Echocardiogram examinations, most                 recent 05/14/2018. Cardiomyopathy, Signs/Symptoms:Shortness of                 Breath, Chest Pain and Bacteremia; Risk Factors:Hypertension and                 Current Smoker.  Sonographer:    Vickie Epley RDCS Referring Phys: [id number] [place]  1. Left ventricular ejection fraction, by estimation, is 65 to 70%. The left ventricle has hyperdynamic function. The left ventricle has no regional wall motion abnormalities. There is severe left ventricular hypertrophy that appears concentric. Left ventricular diastolic parameters are consistent with Grade II diastolic dysfunction (pseudonormalization). There is mitral valve systolic anterior motion  with significant LV outflow tract turbulence and a gradient that appears to be in the LVOT (and not MR) with peak 130 mmHg.  2. Right ventricular systolic function is normal. The right ventricular size is normal.  3. Left atrial size was moderately dilated.  4. The mitral valve is degenerative. There is systolic anterior motion of the anterior leaflet. Mild to moderate mitral valve regurgitation. Mild mitral stenosis. The mean mitral valve gradient is 4.0 mmHg. MVA by PHT 2.17 cm^2.  5. The aortic valve is tricuspid. Aortic valve regurgitation is moderate. Mild aortic valve sclerosis is present, with no evidence of aortic valve stenosis.  6. The inferior vena cava is dilated in size with <50% respiratory variability, suggesting right atrial pressure of 15 mmHg.  7. No complete TR doppler jet so unable to estimate PA systolic pressure.  8. Markedly high LVOT gradient with mitral valve SAM and concentric LVH. Possible HCM variant. FINDINGS  Left Ventricle: Left ventricular ejection fraction, by estimation, is 65 to 70%. The left ventricle has hyperdynamic function. The left ventricle has no regional wall motion abnormalities. The left  ventricular internal cavity size was normal in size. There is severe left ventricular hypertrophy. Left ventricular diastolic parameters are consistent with Grade II diastolic dysfunction (pseudonormalization). Right Ventricle: The right ventricular size is normal. No increase in right ventricular wall thickness. Right ventricular systolic function is normal. Left Atrium: Left atrial size was moderately dilated. Right Atrium: Right atrial size was normal in size. Pericardium: There is no evidence of pericardial effusion. Mitral Valve: The mitral valve is degenerative in appearance. There is moderate calcification of the mitral valve leaflet(s). Moderate mitral annular calcification. Mild to moderate mitral valve regurgitation. Mild mitral valve stenosis. The mean mitral valve gradient is 4.0 mmHg. Tricuspid Valve: The tricuspid valve is normal in structure. Tricuspid valve regurgitation is not demonstrated. Aortic Valve: The aortic valve is tricuspid. Aortic valve regurgitation is moderate. Aortic regurgitation PHT measures 628 msec. Mild aortic valve sclerosis is present, with no evidence of aortic valve stenosis. Pulmonic Valve: The pulmonic valve was normal in structure. Pulmonic valve regurgitation is not visualized. Aorta: The aortic root is normal in size and structure. Venous: The inferior vena cava is dilated in size with less than 50% respiratory variability, suggesting right atrial pressure of 15 mmHg. IAS/Shunts: No atrial level shunt detected by color flow Doppler.  LEFT VENTRICLE PLAX 2D LVIDd:         3.30 cm  Diastology LVIDs:         2.40 cm  LV e' lateral:   3.50 cm/s LV PW:         1.40 cm  LV E/e' lateral: 40.0 LV IVS:        1.40 cm  LV e' medial:    3.59 cm/s LVOT diam:     2.00 cm  LV E/e' medial:  39.0 LVOT Area:     3.14 cm  RIGHT VENTRICLE RV S prime:     16.80 cm/s TAPSE (M-mode): 1.6 cm LEFT ATRIUM             Index       RIGHT ATRIUM           Index LA diam:        3.80 cm 2.38 cm/m  RA  Area:     12.80 cm LA Vol (A2C):   67.6 ml 42.40 ml/m RA Volume:   29.10 ml  18.25 ml/m LA Vol (A4C):   80.3 ml  50.37 ml/m LA Biplane Vol: 74.4 ml 46.67 ml/m  AORTIC VALVE AI PHT:      628 msec  AORTA Ao Root diam: 3.10 cm MITRAL VALVE MV Area (PHT): 2.26 cm     SHUNTS MV Mean grad:  4.0 mmHg     Systemic Diam: 2.00 cm MV Decel Time: 335 msec MR Peak grad: 147.9 mmHg MR Vmax:      608.00 cm/s MV E velocity: 140.00 cm/s MV A velocity: 143.00 cm/s MV E/A ratio:  0.98 Loralie Champagne MD Electronically signed by Loralie Champagne MD Signature Date/Time: 08/03/2019/12:39:12 PM    Final    US Abdomen Limited RUQ  Result Date: 08/03/2019 CLINICAL DATA:  Abdominal pain EXAM: ULTRASOUND ABDOMEN LIMITED RIGHT UPPER QUADRANT COMPARISON:  09/29/2018 FINDINGS: Gallbladder: Gallbladder is well distended with small echogenic foci consistent with polyps. No pericholecystic fluid is noted. No cholelithiasis is seen. Common bile duct: Diameter: 5 mm Liver: Generalized increased echogenicity is noted consistent with fatty infiltration. No focal mass is noted. Portal vein is patent on color Doppler imaging with normal direction of blood flow towards the liver. Other: None. IMPRESSION: Fatty liver. Gallbladder polyps. Electronically Signed   By: Inez Catalina M.D.   On: 08/03/2019 02:47    Microbiology: Recent Results (from the past 240 hour(s))  Respiratory Panel by RT PCR (Flu A&B, Covid) - Nasopharyngeal Swab     Status: None   Collection Time: 08/01/19  9:03 AM   Specimen: Nasopharyngeal Swab  Result Value Ref Range Status   SARS Coronavirus 2 by RT PCR NEGATIVE NEGATIVE Final    Comment: (NOTE) SARS-CoV-2 target nucleic acids are NOT DETECTED. The SARS-CoV-2 RNA is generally detectable in upper respiratoy specimens during the acute phase of infection. The lowest concentration of SARS-CoV-2 viral copies this assay can detect is 131 copies/mL. A negative result does not preclude SARS-Cov-2 infection and should not be  used as the sole basis for treatment or other patient management decisions. A negative result may occur with  improper specimen collection/handling, submission of specimen other than nasopharyngeal swab, presence of viral mutation(s) within the areas targeted by this assay, and inadequate number of viral copies (<131 copies/mL). A negative result must be combined with clinical observations, patient history, and epidemiological information. The expected result is Negative. Fact Sheet for Patients:  PinkCheek.be Fact Sheet for Healthcare Providers:  GravelBags.it This test is not yet ap proved or cleared by the [place] FDA and  has been authorized for detection and/or diagnosis of SARS-CoV-2 by FDA under an Emergency Use Authorization (EUA). This EUA will remain  in effect (meaning this test can be used) for the duration of the COVID-19 declaration under Section 564(b)(1) of the Act, 21 U.S.C. section 360bbb-3(b)(1), unless the authorization is terminated or revoked sooner.    Influenza A by PCR NEGATIVE NEGATIVE Final   Influenza B by PCR NEGATIVE NEGATIVE Final    Comment: (NOTE) The Xpert Xpress SARS-CoV-2/FLU/RSV assay is intended as an aid in  the diagnosis of influenza from Nasopharyngeal swab specimens and  should not be used as a sole basis for treatment. Nasal washings and  aspirates are unacceptable for Xpert Xpress SARS-CoV-2/FLU/RSV  testing. Fact Sheet for Patients: PinkCheek.be Fact Sheet for Healthcare Providers: GravelBags.it This test is not yet approved or cleared by the [place] FDA and  has been authorized for detection and/or diagnosis of SARS-CoV-2 by  FDA under an Emergency Use Authorization (EUA). This EUA will remain  in effect (meaning this test  can be used) for the duration of the  Covid-19 declaration under Section 564(b)(1) of the  Act, 21  U.S.C. section 360bbb-3(b)(1), unless the authorization is  terminated or revoked. Performed at [facility], [place] [address] [place], [place] [address]      Labs: Basic Metabolic Panel: Recent Labs  Lab 08/01/19 [phone] 08/02/19 0429 08/02/19 1834 08/03/19 0319 08/03/19 1111 08/04/19 0407 08/05/19 0343  NA 138 137  --  139  --  138 135  K 5.6* 4.5  --  3.2*  --  3.6 3.9  CL 95* 98  --  100  --  102 98  CO2 24 22  --  25  --  23 26  GLUCOSE 95 75  --  92  --  98 91  BUN 59* 78*  --  20  --  27* 8  CREATININE 7.49* 8.77*  --  4.29*  --  6.43* 4.20*  CALCIUM 9.2 9.3  --  8.4*  --  8.6* 8.2*  MG  --   --  1.6*  --  2.3  --   --   PHOS  --   --   --   --   --  2.5  --    Liver Function Tests: Recent Labs  Lab 08/01/19 0655 08/04/19 0407  AST 18  --   ALT 10  --   ALKPHOS 59  --   BILITOT 0.9  --   PROT 5.8*  --   ALBUMIN 2.5* 2.5*   No results for input(s): LIPASE, AMYLASE in the last 168 hours. No results for input(s): AMMONIA in the last 168 hours. CBC: Recent Labs  Lab 08/01/19 0655 08/01/19 1931 08/02/19 1834 08/02/19 1834 08/03/19 0319 08/03/19 0541 08/03/19 1111 08/04/19 0709 08/05/19 0343  WBC 9.1   < > 6.6  --  6.1 5.7 6.2 7.4  --   NEUTROABS 7.1  --   --   --   --   --   --   --   --   HGB 9.1*   < > 6.5*   < > 10.3* 9.6* 10.1* 10.3* 9.9*  HCT 27.8*   < > 19.0*   < > 29.2* 27.7* 28.9* 29.7* 29.0*  MCV 89.7   < > 85.6  --  88.0 87.1 88.9 88.4  --   PLT 275   < > 184  --  147* 154 167 181  --    < > = values in this interval not displayed.   Cardiac Enzymes: No results for input(s): CKTOTAL, CKMB, CKMBINDEX, TROPONINI in the last 168 hours. BNP: BNP (last 3 results) Recent Labs    09/15/18 2126  BNP 3,309.9*    ProBNP (last 3 results) No results for input(s): PROBNP in the last 8760 hours.  CBG: No results for input(s): GLUCAP in the last 168 hours.     Signed:  Cristal Ford  [facility] 08/05/2019, 1:03  PM

## 2019-08-05 NOTE — Plan of Care (Signed)
  Problem: Education: Goal: Knowledge of General Education information will improve Description: Including pain rating scale, medication(s)/side effects and non-pharmacologic comfort measures 08/05/2019 1247 by Camillia Herter, RN Outcome: Adequate for Discharge 08/05/2019 0716 by Camillia Herter, RN Outcome: Progressing   Problem: Health Behavior/Discharge Planning: Goal: Ability to manage health-related needs will improve 08/05/2019 1247 by Camillia Herter, RN Outcome: Adequate for Discharge 08/05/2019 0716 by Camillia Herter, RN Outcome: Progressing   Problem: Clinical Measurements: Goal: Ability to maintain clinical measurements within normal limits will improve 08/05/2019 1247 by Camillia Herter, RN Outcome: Adequate for Discharge 08/05/2019 0716 by Camillia Herter, RN Outcome: Progressing Goal: Will remain free from infection 08/05/2019 1247 by Camillia Herter, RN Outcome: Adequate for Discharge 08/05/2019 0716 by Camillia Herter, RN Outcome: Progressing Goal: Diagnostic test results will improve 08/05/2019 1247 by Camillia Herter, RN Outcome: Adequate for Discharge 08/05/2019 0716 by Camillia Herter, RN Outcome: Progressing Goal: Respiratory complications will improve 08/05/2019 1247 by Camillia Herter, RN Outcome: Adequate for Discharge 08/05/2019 0716 by Camillia Herter, RN Outcome: Progressing Goal: Cardiovascular complication will be avoided 08/05/2019 1247 by Camillia Herter, RN Outcome: Adequate for Discharge 08/05/2019 0716 by Camillia Herter, RN Outcome: Progressing   Problem: Activity: Goal: Risk for activity intolerance will decrease 08/05/2019 1247 by Camillia Herter, RN Outcome: Adequate for Discharge 08/05/2019 0716 by Camillia Herter, RN Outcome: Progressing   Problem: Nutrition: Goal: Adequate nutrition will be maintained 08/05/2019 1247 by Camillia Herter, RN Outcome: Adequate for Discharge 08/05/2019 0716 by Camillia Herter, RN Outcome: Progressing   Problem: Coping: Goal: Level of anxiety  will decrease 08/05/2019 1247 by Camillia Herter, RN Outcome: Adequate for Discharge 08/05/2019 0716 by Camillia Herter, RN Outcome: Progressing   Problem: Elimination: Goal: Will not experience complications related to bowel motility 08/05/2019 1247 by Camillia Herter, RN Outcome: Adequate for Discharge 08/05/2019 0716 by Camillia Herter, RN Outcome: Progressing Goal: Will not experience complications related to urinary retention 08/05/2019 1247 by Camillia Herter, RN Outcome: Adequate for Discharge 08/05/2019 0716 by Camillia Herter, RN Outcome: Progressing   Problem: Pain Managment: Goal: General experience of comfort will improve 08/05/2019 1247 by Camillia Herter, RN Outcome: Adequate for Discharge 08/05/2019 0716 by Camillia Herter, RN Outcome: Progressing   Problem: Safety: Goal: Ability to remain free from injury will improve 08/05/2019 1247 by Camillia Herter, RN Outcome: Adequate for Discharge 08/05/2019 0716 by Camillia Herter, RN Outcome: Progressing   Problem: Skin Integrity: Goal: Risk for impaired skin integrity will decrease 08/05/2019 1247 by Camillia Herter, RN Outcome: Adequate for Discharge 08/05/2019 0716 by Camillia Herter, RN Outcome: Progressing

## 2019-08-05 NOTE — Progress Notes (Addendum)
Hemoglobin essentially stable since yesterday. Patient states she had 1 very small BM.  Wants to go home. Okay for discharge from my standpoint.  Please see yesterday's note for further recommendations:  Cleotis Nipper, M.D. Pager [phone] If no answer or after 5 PM call [phone]

## 2019-08-06 ENCOUNTER — Telehealth: Payer: Self-pay | Admitting: Physician Assistant

## 2019-08-06 ENCOUNTER — Telehealth: Payer: Self-pay

## 2019-08-06 NOTE — Telephone Encounter (Signed)
Transition Care Management Follow-up Telephone Call  Date of discharge and from where: 08/05/2019, [facility]   How have you been since you were released from the hospital? She said that she is feeling " a lot better" except her legs are week.  She has been using her cane.  She was at [employer] at the time of this call.   Any questions or concerns? No concerns/questions at this time   Items Reviewed:  Did the pt receive and understand the discharge instructions provided?she said she has them at home and had no questions.   Medications obtained and verified?  she did not have any questions about medications. She said that she has not picked up the new medications.  Encouraged  her to call the clinic with any questions after she reviews the medication list. Also instructed her to pay attention to the new and changed medications on the list,   Any new allergies since your discharge? None reported   Do you have support at home?  Yes, her husband  Other (ie: DME, Home Health, etc) no home health ordered.  Attends HD T/T/S  Has cane and walker   Functional Questionnaire: (I = Independent and D = Dependent) ADL's: independent, husband assists if needed. Using her cane with ambulation.  Follow up appointments reviewed:    PCP Hospital f/u appt confirmed? She said she would call to schedule an appointment as she was not at home   [facility] f/u appt confirmed?  Cardiology-  08/28/2019, needs to schedule follow up with nephrology and GI  Are transportation arrangements needed?  no, she said she can use [facility] when needed  If their condition worsens, is the pt aware to call  their PCP or go to the ED? yes  Was the patient provided with contact information for the PCP's office or ED?  She said she has the clinic phone number at home  Was the pt encouraged to call back with questions or concerns? yes

## 2019-08-06 NOTE — Telephone Encounter (Signed)
Transition of care from inpatient facility  Date of discharge: 08/05/19 Date of contact: 08/06/19 Method of contact: Phone  Attempted to contact patient to discuss transition of care from inpatient facility. Patient did not answer the phone. Left message will call back instructions. Will continue to attempt to contact patient by phone or at HD unit.  Anice Paganini, PA-C 08/06/2019, 2:04 PM  [facility] Pager: [phone]

## 2019-08-07 DIAGNOSIS — Z992 Dependence on renal dialysis: Secondary | ICD-10-CM | POA: Diagnosis not present

## 2019-08-07 DIAGNOSIS — I4891 Unspecified atrial fibrillation: Secondary | ICD-10-CM | POA: Diagnosis not present

## 2019-08-07 DIAGNOSIS — K922 Gastrointestinal hemorrhage, unspecified: Secondary | ICD-10-CM | POA: Diagnosis not present

## 2019-08-07 DIAGNOSIS — D631 Anemia in chronic kidney disease: Secondary | ICD-10-CM | POA: Diagnosis not present

## 2019-08-07 DIAGNOSIS — D509 Iron deficiency anemia, unspecified: Secondary | ICD-10-CM | POA: Diagnosis not present

## 2019-08-07 DIAGNOSIS — E875 Hyperkalemia: Secondary | ICD-10-CM | POA: Diagnosis not present

## 2019-08-07 DIAGNOSIS — N186 End stage renal disease: Secondary | ICD-10-CM | POA: Diagnosis not present

## 2019-08-07 DIAGNOSIS — N2581 Secondary hyperparathyroidism of renal origin: Secondary | ICD-10-CM | POA: Diagnosis not present

## 2019-08-09 DIAGNOSIS — N186 End stage renal disease: Secondary | ICD-10-CM | POA: Diagnosis not present

## 2019-08-09 DIAGNOSIS — E875 Hyperkalemia: Secondary | ICD-10-CM | POA: Diagnosis not present

## 2019-08-09 DIAGNOSIS — D631 Anemia in chronic kidney disease: Secondary | ICD-10-CM | POA: Diagnosis not present

## 2019-08-09 DIAGNOSIS — Z992 Dependence on renal dialysis: Secondary | ICD-10-CM | POA: Diagnosis not present

## 2019-08-09 DIAGNOSIS — D509 Iron deficiency anemia, unspecified: Secondary | ICD-10-CM | POA: Diagnosis not present

## 2019-08-09 DIAGNOSIS — N2581 Secondary hyperparathyroidism of renal origin: Secondary | ICD-10-CM | POA: Diagnosis not present

## 2019-08-11 DIAGNOSIS — D631 Anemia in chronic kidney disease: Secondary | ICD-10-CM | POA: Diagnosis not present

## 2019-08-11 DIAGNOSIS — N186 End stage renal disease: Secondary | ICD-10-CM | POA: Diagnosis not present

## 2019-08-11 DIAGNOSIS — D509 Iron deficiency anemia, unspecified: Secondary | ICD-10-CM | POA: Diagnosis not present

## 2019-08-11 DIAGNOSIS — E875 Hyperkalemia: Secondary | ICD-10-CM | POA: Diagnosis not present

## 2019-08-11 DIAGNOSIS — N2581 Secondary hyperparathyroidism of renal origin: Secondary | ICD-10-CM | POA: Diagnosis not present

## 2019-08-11 DIAGNOSIS — Z992 Dependence on renal dialysis: Secondary | ICD-10-CM | POA: Diagnosis not present

## 2019-08-14 DIAGNOSIS — Z992 Dependence on renal dialysis: Secondary | ICD-10-CM | POA: Diagnosis not present

## 2019-08-14 DIAGNOSIS — D509 Iron deficiency anemia, unspecified: Secondary | ICD-10-CM | POA: Diagnosis not present

## 2019-08-14 DIAGNOSIS — D631 Anemia in chronic kidney disease: Secondary | ICD-10-CM | POA: Diagnosis not present

## 2019-08-14 DIAGNOSIS — N186 End stage renal disease: Secondary | ICD-10-CM | POA: Diagnosis not present

## 2019-08-14 DIAGNOSIS — E875 Hyperkalemia: Secondary | ICD-10-CM | POA: Diagnosis not present

## 2019-08-14 DIAGNOSIS — N2581 Secondary hyperparathyroidism of renal origin: Secondary | ICD-10-CM | POA: Diagnosis not present

## 2019-08-15 ENCOUNTER — Encounter: Payer: Self-pay | Admitting: Cardiology

## 2019-08-16 DIAGNOSIS — E875 Hyperkalemia: Secondary | ICD-10-CM | POA: Diagnosis not present

## 2019-08-16 DIAGNOSIS — N186 End stage renal disease: Secondary | ICD-10-CM | POA: Diagnosis not present

## 2019-08-16 DIAGNOSIS — D509 Iron deficiency anemia, unspecified: Secondary | ICD-10-CM | POA: Diagnosis not present

## 2019-08-16 DIAGNOSIS — D631 Anemia in chronic kidney disease: Secondary | ICD-10-CM | POA: Diagnosis not present

## 2019-08-16 DIAGNOSIS — Z992 Dependence on renal dialysis: Secondary | ICD-10-CM | POA: Diagnosis not present

## 2019-08-16 DIAGNOSIS — N2581 Secondary hyperparathyroidism of renal origin: Secondary | ICD-10-CM | POA: Diagnosis not present

## 2019-08-17 ENCOUNTER — Encounter: Payer: Self-pay | Admitting: Cardiovascular Disease

## 2019-08-21 DIAGNOSIS — D509 Iron deficiency anemia, unspecified: Secondary | ICD-10-CM | POA: Diagnosis not present

## 2019-08-21 DIAGNOSIS — N2581 Secondary hyperparathyroidism of renal origin: Secondary | ICD-10-CM | POA: Diagnosis not present

## 2019-08-21 DIAGNOSIS — Z992 Dependence on renal dialysis: Secondary | ICD-10-CM | POA: Diagnosis not present

## 2019-08-21 DIAGNOSIS — D631 Anemia in chronic kidney disease: Secondary | ICD-10-CM | POA: Diagnosis not present

## 2019-08-21 DIAGNOSIS — N186 End stage renal disease: Secondary | ICD-10-CM | POA: Diagnosis not present

## 2019-08-21 DIAGNOSIS — E875 Hyperkalemia: Secondary | ICD-10-CM | POA: Diagnosis not present

## 2019-08-25 DIAGNOSIS — D631 Anemia in chronic kidney disease: Secondary | ICD-10-CM | POA: Diagnosis not present

## 2019-08-25 DIAGNOSIS — Z992 Dependence on renal dialysis: Secondary | ICD-10-CM | POA: Diagnosis not present

## 2019-08-25 DIAGNOSIS — D509 Iron deficiency anemia, unspecified: Secondary | ICD-10-CM | POA: Diagnosis not present

## 2019-08-25 DIAGNOSIS — N2581 Secondary hyperparathyroidism of renal origin: Secondary | ICD-10-CM | POA: Diagnosis not present

## 2019-08-25 DIAGNOSIS — E875 Hyperkalemia: Secondary | ICD-10-CM | POA: Diagnosis not present

## 2019-08-25 DIAGNOSIS — N186 End stage renal disease: Secondary | ICD-10-CM | POA: Diagnosis not present

## 2019-08-28 ENCOUNTER — Ambulatory Visit: Payer: Medicare Other | Admitting: Cardiology

## 2019-08-28 DIAGNOSIS — N2581 Secondary hyperparathyroidism of renal origin: Secondary | ICD-10-CM | POA: Diagnosis not present

## 2019-08-28 DIAGNOSIS — D509 Iron deficiency anemia, unspecified: Secondary | ICD-10-CM | POA: Diagnosis not present

## 2019-08-28 DIAGNOSIS — D631 Anemia in chronic kidney disease: Secondary | ICD-10-CM | POA: Diagnosis not present

## 2019-08-28 DIAGNOSIS — E875 Hyperkalemia: Secondary | ICD-10-CM | POA: Diagnosis not present

## 2019-08-28 DIAGNOSIS — N186 End stage renal disease: Secondary | ICD-10-CM | POA: Diagnosis not present

## 2019-08-28 DIAGNOSIS — Z992 Dependence on renal dialysis: Secondary | ICD-10-CM | POA: Diagnosis not present

## 2019-08-30 DIAGNOSIS — N186 End stage renal disease: Secondary | ICD-10-CM | POA: Diagnosis not present

## 2019-08-30 DIAGNOSIS — I120 Hypertensive chronic kidney disease with stage 5 chronic kidney disease or end stage renal disease: Secondary | ICD-10-CM | POA: Diagnosis not present

## 2019-08-30 DIAGNOSIS — Z992 Dependence on renal dialysis: Secondary | ICD-10-CM | POA: Diagnosis not present

## 2019-09-06 ENCOUNTER — Other Ambulatory Visit: Payer: Self-pay

## 2019-09-06 ENCOUNTER — Encounter (HOSPITAL_COMMUNITY): Payer: Self-pay

## 2019-09-06 ENCOUNTER — Emergency Department (HOSPITAL_COMMUNITY)
Admission: EM | Admit: 2019-09-06 | Discharge: 2019-09-07 | Disposition: A | Discharge status: Home / Self Care | Payer: Medicare Other | Attending: Emergency Medicine | Admitting: Emergency Medicine

## 2019-09-06 DIAGNOSIS — N2581 Secondary hyperparathyroidism of renal origin: Secondary | ICD-10-CM | POA: Diagnosis not present

## 2019-09-06 DIAGNOSIS — R197 Diarrhea, unspecified: Secondary | ICD-10-CM | POA: Diagnosis not present

## 2019-09-06 DIAGNOSIS — R410 Disorientation, unspecified: Secondary | ICD-10-CM | POA: Diagnosis not present

## 2019-09-06 DIAGNOSIS — N186 End stage renal disease: Secondary | ICD-10-CM | POA: Diagnosis not present

## 2019-09-06 DIAGNOSIS — K7460 Unspecified cirrhosis of liver: Secondary | ICD-10-CM | POA: Diagnosis not present

## 2019-09-06 DIAGNOSIS — Z992 Dependence on renal dialysis: Secondary | ICD-10-CM | POA: Diagnosis not present

## 2019-09-06 DIAGNOSIS — D631 Anemia in chronic kidney disease: Secondary | ICD-10-CM | POA: Diagnosis not present

## 2019-09-06 DIAGNOSIS — R4182 Altered mental status, unspecified: Secondary | ICD-10-CM | POA: Diagnosis not present

## 2019-09-06 DIAGNOSIS — E161 Other hypoglycemia: Secondary | ICD-10-CM | POA: Diagnosis not present

## 2019-09-06 DIAGNOSIS — Z5321 Procedure and treatment not carried out due to patient leaving prior to being seen by health care provider: Secondary | ICD-10-CM | POA: Insufficient documentation

## 2019-09-06 DIAGNOSIS — Z23 Encounter for immunization: Secondary | ICD-10-CM | POA: Diagnosis not present

## 2019-09-06 DIAGNOSIS — E162 Hypoglycemia, unspecified: Secondary | ICD-10-CM | POA: Diagnosis not present

## 2019-09-06 DIAGNOSIS — R404 Transient alteration of awareness: Secondary | ICD-10-CM | POA: Diagnosis not present

## 2019-09-06 DIAGNOSIS — E875 Hyperkalemia: Secondary | ICD-10-CM | POA: Diagnosis not present

## 2019-09-06 DIAGNOSIS — D509 Iron deficiency anemia, unspecified: Secondary | ICD-10-CM | POA: Diagnosis not present

## 2019-09-06 LAB — CBC
HCT: 26.9 % — ABNORMAL LOW (ref 36.0–46.0)
Hemoglobin: 9.2 g/dL — ABNORMAL LOW (ref 12.0–15.0)
MCH: 29.2 pg (ref 26.0–34.0)
MCHC: 34.2 g/dL (ref 30.0–36.0)
MCV: 85.4 fL (ref 80.0–100.0)
Platelets: 215 10*3/uL (ref 150–400)
RBC: 3.15 MIL/uL — ABNORMAL LOW (ref 3.87–5.11)
RDW: 14.7 % (ref 11.5–15.5)
WBC: 5.3 10*3/uL (ref 4.0–10.5)
nRBC: 0 % (ref 0.0–0.2)

## 2019-09-06 LAB — COMPREHENSIVE METABOLIC PANEL
ALT: 9 U/L (ref 0–44)
AST: 15 U/L (ref 15–41)
Albumin: 2.7 g/dL — ABNORMAL LOW (ref 3.5–5.0)
Alkaline Phosphatase: 77 U/L (ref 38–126)
Anion gap: 18 — ABNORMAL HIGH (ref 5–15)
BUN: 17 mg/dL (ref 6–20)
CO2: 23 mmol/L (ref 22–32)
Calcium: 8.6 mg/dL — ABNORMAL LOW (ref 8.9–10.3)
Chloride: 96 mmol/L — ABNORMAL LOW (ref 98–111)
Creatinine, Ser: 5.76 mg/dL — ABNORMAL HIGH (ref 0.44–1.00)
GFR calc Af Amer: 9 mL/min — ABNORMAL LOW (ref >60)
GFR calc non Af Amer: 7 mL/min — ABNORMAL LOW (ref >60)
Glucose, Bld: 83 mg/dL (ref 70–99)
Potassium: 3.5 mmol/L (ref 3.5–5.1)
Sodium: 137 mmol/L (ref 135–145)
Total Bilirubin: 0.3 mg/dL (ref 0.3–1.2)
Total Protein: 6.1 g/dL — ABNORMAL LOW (ref 6.5–8.1)

## 2019-09-06 LAB — I-STAT BETA HCG BLOOD, ED (MC, WL, AP ONLY): I-stat hCG, quantitative: 14.7 m[IU]/mL — ABNORMAL HIGH (ref <5)

## 2019-09-06 MED ORDER — SODIUM CHLORIDE 0.9% FLUSH: 3.0000 mL | Freq: Once | INTRAVENOUS | Status: DC

## 2019-09-06 NOTE — ED Triage Notes (Signed)
Pt bib [facility] from home w/ c/o AMS x 2 weeks and diarrhea x 3 days. Pt AOx2 w/ EMS. EMS VSS.

## 2019-09-07 DIAGNOSIS — N186 End stage renal disease: Secondary | ICD-10-CM | POA: Diagnosis not present

## 2019-09-07 DIAGNOSIS — N2581 Secondary hyperparathyroidism of renal origin: Secondary | ICD-10-CM | POA: Diagnosis not present

## 2019-09-07 DIAGNOSIS — D631 Anemia in chronic kidney disease: Secondary | ICD-10-CM | POA: Diagnosis not present

## 2019-09-07 DIAGNOSIS — Z992 Dependence on renal dialysis: Secondary | ICD-10-CM | POA: Diagnosis not present

## 2019-09-07 DIAGNOSIS — E875 Hyperkalemia: Secondary | ICD-10-CM | POA: Diagnosis not present

## 2019-09-07 DIAGNOSIS — D509 Iron deficiency anemia, unspecified: Secondary | ICD-10-CM | POA: Diagnosis not present

## 2019-09-07 NOTE — ED Notes (Signed)
Called for recheck VS x3, no response 

## 2019-09-07 NOTE — ED Notes (Signed)
Called for recheck VS x2, no response.

## 2019-09-10 ENCOUNTER — Encounter: Payer: Self-pay | Admitting: Cardiology

## 2019-09-11 DIAGNOSIS — Z992 Dependence on renal dialysis: Secondary | ICD-10-CM | POA: Diagnosis not present

## 2019-09-11 DIAGNOSIS — D631 Anemia in chronic kidney disease: Secondary | ICD-10-CM | POA: Diagnosis not present

## 2019-09-11 DIAGNOSIS — N2581 Secondary hyperparathyroidism of renal origin: Secondary | ICD-10-CM | POA: Diagnosis not present

## 2019-09-11 DIAGNOSIS — E875 Hyperkalemia: Secondary | ICD-10-CM | POA: Diagnosis not present

## 2019-09-11 DIAGNOSIS — N186 End stage renal disease: Secondary | ICD-10-CM | POA: Diagnosis not present

## 2019-09-11 DIAGNOSIS — D509 Iron deficiency anemia, unspecified: Secondary | ICD-10-CM | POA: Diagnosis not present

## 2019-09-15 DIAGNOSIS — N2581 Secondary hyperparathyroidism of renal origin: Secondary | ICD-10-CM | POA: Diagnosis not present

## 2019-09-15 DIAGNOSIS — N186 End stage renal disease: Secondary | ICD-10-CM | POA: Diagnosis not present

## 2019-09-15 DIAGNOSIS — D631 Anemia in chronic kidney disease: Secondary | ICD-10-CM | POA: Diagnosis not present

## 2019-09-15 DIAGNOSIS — Z992 Dependence on renal dialysis: Secondary | ICD-10-CM | POA: Diagnosis not present

## 2019-09-15 DIAGNOSIS — D509 Iron deficiency anemia, unspecified: Secondary | ICD-10-CM | POA: Diagnosis not present

## 2019-09-15 DIAGNOSIS — E875 Hyperkalemia: Secondary | ICD-10-CM | POA: Diagnosis not present

## 2019-09-22 DIAGNOSIS — D631 Anemia in chronic kidney disease: Secondary | ICD-10-CM | POA: Diagnosis not present

## 2019-09-22 DIAGNOSIS — N186 End stage renal disease: Secondary | ICD-10-CM | POA: Diagnosis not present

## 2019-09-22 DIAGNOSIS — N2581 Secondary hyperparathyroidism of renal origin: Secondary | ICD-10-CM | POA: Diagnosis not present

## 2019-09-22 DIAGNOSIS — Z992 Dependence on renal dialysis: Secondary | ICD-10-CM | POA: Diagnosis not present

## 2019-09-22 DIAGNOSIS — E875 Hyperkalemia: Secondary | ICD-10-CM | POA: Diagnosis not present

## 2019-09-22 DIAGNOSIS — D509 Iron deficiency anemia, unspecified: Secondary | ICD-10-CM | POA: Diagnosis not present

## 2019-09-25 DIAGNOSIS — N2581 Secondary hyperparathyroidism of renal origin: Secondary | ICD-10-CM | POA: Diagnosis not present

## 2019-09-25 DIAGNOSIS — D631 Anemia in chronic kidney disease: Secondary | ICD-10-CM | POA: Diagnosis not present

## 2019-09-25 DIAGNOSIS — Z992 Dependence on renal dialysis: Secondary | ICD-10-CM | POA: Diagnosis not present

## 2019-09-25 DIAGNOSIS — N186 End stage renal disease: Secondary | ICD-10-CM | POA: Diagnosis not present

## 2019-09-25 DIAGNOSIS — E875 Hyperkalemia: Secondary | ICD-10-CM | POA: Diagnosis not present

## 2019-09-25 DIAGNOSIS — D509 Iron deficiency anemia, unspecified: Secondary | ICD-10-CM | POA: Diagnosis not present

## 2019-09-27 DIAGNOSIS — D631 Anemia in chronic kidney disease: Secondary | ICD-10-CM | POA: Diagnosis not present

## 2019-09-27 DIAGNOSIS — N186 End stage renal disease: Secondary | ICD-10-CM | POA: Diagnosis not present

## 2019-09-27 DIAGNOSIS — E875 Hyperkalemia: Secondary | ICD-10-CM | POA: Diagnosis not present

## 2019-09-27 DIAGNOSIS — D509 Iron deficiency anemia, unspecified: Secondary | ICD-10-CM | POA: Diagnosis not present

## 2019-09-27 DIAGNOSIS — Z992 Dependence on renal dialysis: Secondary | ICD-10-CM | POA: Diagnosis not present

## 2019-09-27 DIAGNOSIS — N2581 Secondary hyperparathyroidism of renal origin: Secondary | ICD-10-CM | POA: Diagnosis not present

## 2019-09-29 DIAGNOSIS — E875 Hyperkalemia: Secondary | ICD-10-CM | POA: Diagnosis not present

## 2019-09-29 DIAGNOSIS — Z992 Dependence on renal dialysis: Secondary | ICD-10-CM | POA: Diagnosis not present

## 2019-09-29 DIAGNOSIS — D631 Anemia in chronic kidney disease: Secondary | ICD-10-CM | POA: Diagnosis not present

## 2019-09-29 DIAGNOSIS — N2581 Secondary hyperparathyroidism of renal origin: Secondary | ICD-10-CM | POA: Diagnosis not present

## 2019-09-29 DIAGNOSIS — I120 Hypertensive chronic kidney disease with stage 5 chronic kidney disease or end stage renal disease: Secondary | ICD-10-CM | POA: Diagnosis not present

## 2019-09-29 DIAGNOSIS — D509 Iron deficiency anemia, unspecified: Secondary | ICD-10-CM | POA: Diagnosis not present

## 2019-09-29 DIAGNOSIS — N186 End stage renal disease: Secondary | ICD-10-CM | POA: Diagnosis not present

## 2019-10-02 DIAGNOSIS — N2581 Secondary hyperparathyroidism of renal origin: Secondary | ICD-10-CM | POA: Diagnosis not present

## 2019-10-02 DIAGNOSIS — D509 Iron deficiency anemia, unspecified: Secondary | ICD-10-CM | POA: Diagnosis not present

## 2019-10-02 DIAGNOSIS — D631 Anemia in chronic kidney disease: Secondary | ICD-10-CM | POA: Diagnosis not present

## 2019-10-02 DIAGNOSIS — N186 End stage renal disease: Secondary | ICD-10-CM | POA: Diagnosis not present

## 2019-10-02 DIAGNOSIS — E875 Hyperkalemia: Secondary | ICD-10-CM | POA: Diagnosis not present

## 2019-10-02 DIAGNOSIS — Z992 Dependence on renal dialysis: Secondary | ICD-10-CM | POA: Diagnosis not present

## 2019-10-04 DIAGNOSIS — D509 Iron deficiency anemia, unspecified: Secondary | ICD-10-CM | POA: Diagnosis not present

## 2019-10-04 DIAGNOSIS — E875 Hyperkalemia: Secondary | ICD-10-CM | POA: Diagnosis not present

## 2019-10-04 DIAGNOSIS — N2581 Secondary hyperparathyroidism of renal origin: Secondary | ICD-10-CM | POA: Diagnosis not present

## 2019-10-04 DIAGNOSIS — N186 End stage renal disease: Secondary | ICD-10-CM | POA: Diagnosis not present

## 2019-10-04 DIAGNOSIS — D631 Anemia in chronic kidney disease: Secondary | ICD-10-CM | POA: Diagnosis not present

## 2019-10-04 DIAGNOSIS — Z992 Dependence on renal dialysis: Secondary | ICD-10-CM | POA: Diagnosis not present

## 2019-10-06 DIAGNOSIS — D631 Anemia in chronic kidney disease: Secondary | ICD-10-CM | POA: Diagnosis not present

## 2019-10-06 DIAGNOSIS — N2581 Secondary hyperparathyroidism of renal origin: Secondary | ICD-10-CM | POA: Diagnosis not present

## 2019-10-06 DIAGNOSIS — E875 Hyperkalemia: Secondary | ICD-10-CM | POA: Diagnosis not present

## 2019-10-06 DIAGNOSIS — N186 End stage renal disease: Secondary | ICD-10-CM | POA: Diagnosis not present

## 2019-10-06 DIAGNOSIS — D509 Iron deficiency anemia, unspecified: Secondary | ICD-10-CM | POA: Diagnosis not present

## 2019-10-06 DIAGNOSIS — Z992 Dependence on renal dialysis: Secondary | ICD-10-CM | POA: Diagnosis not present

## 2019-10-09 DIAGNOSIS — D509 Iron deficiency anemia, unspecified: Secondary | ICD-10-CM | POA: Diagnosis not present

## 2019-10-09 DIAGNOSIS — Z992 Dependence on renal dialysis: Secondary | ICD-10-CM | POA: Diagnosis not present

## 2019-10-09 DIAGNOSIS — N2581 Secondary hyperparathyroidism of renal origin: Secondary | ICD-10-CM | POA: Diagnosis not present

## 2019-10-09 DIAGNOSIS — N186 End stage renal disease: Secondary | ICD-10-CM | POA: Diagnosis not present

## 2019-10-09 DIAGNOSIS — D631 Anemia in chronic kidney disease: Secondary | ICD-10-CM | POA: Diagnosis not present

## 2019-10-09 DIAGNOSIS — E875 Hyperkalemia: Secondary | ICD-10-CM | POA: Diagnosis not present

## 2019-10-11 DIAGNOSIS — Z992 Dependence on renal dialysis: Secondary | ICD-10-CM | POA: Diagnosis not present

## 2019-10-11 DIAGNOSIS — E875 Hyperkalemia: Secondary | ICD-10-CM | POA: Diagnosis not present

## 2019-10-11 DIAGNOSIS — D631 Anemia in chronic kidney disease: Secondary | ICD-10-CM | POA: Diagnosis not present

## 2019-10-11 DIAGNOSIS — N186 End stage renal disease: Secondary | ICD-10-CM | POA: Diagnosis not present

## 2019-10-11 DIAGNOSIS — D509 Iron deficiency anemia, unspecified: Secondary | ICD-10-CM | POA: Diagnosis not present

## 2019-10-11 DIAGNOSIS — N2581 Secondary hyperparathyroidism of renal origin: Secondary | ICD-10-CM | POA: Diagnosis not present

## 2019-10-13 ENCOUNTER — Other Ambulatory Visit: Payer: Self-pay

## 2019-10-13 ENCOUNTER — Emergency Department (HOSPITAL_COMMUNITY): Payer: Medicare Other

## 2019-10-13 ENCOUNTER — Encounter (HOSPITAL_COMMUNITY): Payer: Self-pay | Admitting: Emergency Medicine

## 2019-10-13 ENCOUNTER — Inpatient Hospital Stay (HOSPITAL_COMMUNITY)
Admission: EM | Admit: 2019-10-13 | Discharge: 2019-10-16 | DRG: 377 | Disposition: A | Discharge status: Home / Self Care | Payer: Medicare Other | Attending: Family Medicine | Admitting: Family Medicine

## 2019-10-13 DIAGNOSIS — I132 Hypertensive heart and chronic kidney disease with heart failure and with stage 5 chronic kidney disease, or end stage renal disease: Secondary | ICD-10-CM | POA: Diagnosis not present

## 2019-10-13 DIAGNOSIS — I5032 Chronic diastolic (congestive) heart failure: Secondary | ICD-10-CM | POA: Diagnosis present

## 2019-10-13 DIAGNOSIS — F17210 Nicotine dependence, cigarettes, uncomplicated: Secondary | ICD-10-CM | POA: Diagnosis present

## 2019-10-13 DIAGNOSIS — N2581 Secondary hyperparathyroidism of renal origin: Secondary | ICD-10-CM | POA: Diagnosis not present

## 2019-10-13 DIAGNOSIS — K219 Gastro-esophageal reflux disease without esophagitis: Secondary | ICD-10-CM | POA: Diagnosis present

## 2019-10-13 DIAGNOSIS — K922 Gastrointestinal hemorrhage, unspecified: Secondary | ICD-10-CM | POA: Diagnosis present

## 2019-10-13 DIAGNOSIS — K254 Chronic or unspecified gastric ulcer with hemorrhage: Secondary | ICD-10-CM | POA: Diagnosis not present

## 2019-10-13 DIAGNOSIS — D631 Anemia in chronic kidney disease: Secondary | ICD-10-CM | POA: Diagnosis not present

## 2019-10-13 DIAGNOSIS — R0789 Other chest pain: Secondary | ICD-10-CM | POA: Diagnosis not present

## 2019-10-13 DIAGNOSIS — I43 Cardiomyopathy in diseases classified elsewhere: Secondary | ICD-10-CM | POA: Diagnosis not present

## 2019-10-13 DIAGNOSIS — K222 Esophageal obstruction: Secondary | ICD-10-CM | POA: Diagnosis present

## 2019-10-13 DIAGNOSIS — I259 Chronic ischemic heart disease, unspecified: Secondary | ICD-10-CM | POA: Diagnosis not present

## 2019-10-13 DIAGNOSIS — E785 Hyperlipidemia, unspecified: Secondary | ICD-10-CM | POA: Diagnosis present

## 2019-10-13 DIAGNOSIS — Z992 Dependence on renal dialysis: Secondary | ICD-10-CM | POA: Diagnosis not present

## 2019-10-13 DIAGNOSIS — E875 Hyperkalemia: Secondary | ICD-10-CM | POA: Diagnosis not present

## 2019-10-13 DIAGNOSIS — N186 End stage renal disease: Secondary | ICD-10-CM | POA: Diagnosis not present

## 2019-10-13 DIAGNOSIS — K2971 Gastritis, unspecified, with bleeding: Secondary | ICD-10-CM | POA: Diagnosis present

## 2019-10-13 DIAGNOSIS — K3189 Other diseases of stomach and duodenum: Secondary | ICD-10-CM | POA: Diagnosis present

## 2019-10-13 DIAGNOSIS — R079 Chest pain, unspecified: Secondary | ICD-10-CM | POA: Diagnosis not present

## 2019-10-13 DIAGNOSIS — Z881 Allergy status to other antibiotic agents status: Secondary | ICD-10-CM

## 2019-10-13 DIAGNOSIS — Z20822 Contact with and (suspected) exposure to covid-19: Secondary | ICD-10-CM | POA: Diagnosis not present

## 2019-10-13 DIAGNOSIS — I119 Hypertensive heart disease without heart failure: Secondary | ICD-10-CM | POA: Diagnosis present

## 2019-10-13 DIAGNOSIS — D509 Iron deficiency anemia, unspecified: Secondary | ICD-10-CM | POA: Diagnosis not present

## 2019-10-13 DIAGNOSIS — K2980 Duodenitis without bleeding: Secondary | ICD-10-CM | POA: Diagnosis present

## 2019-10-13 DIAGNOSIS — I471 Supraventricular tachycardia: Secondary | ICD-10-CM | POA: Diagnosis not present

## 2019-10-13 DIAGNOSIS — Z8249 Family history of ischemic heart disease and other diseases of the circulatory system: Secondary | ICD-10-CM

## 2019-10-13 DIAGNOSIS — E8809 Other disorders of plasma-protein metabolism, not elsewhere classified: Secondary | ICD-10-CM | POA: Diagnosis present

## 2019-10-13 DIAGNOSIS — I272 Pulmonary hypertension, unspecified: Secondary | ICD-10-CM | POA: Diagnosis present

## 2019-10-13 DIAGNOSIS — M898X9 Other specified disorders of bone, unspecified site: Secondary | ICD-10-CM | POA: Diagnosis present

## 2019-10-13 DIAGNOSIS — D62 Acute posthemorrhagic anemia: Secondary | ICD-10-CM | POA: Diagnosis present

## 2019-10-13 DIAGNOSIS — R000 Tachycardia, unspecified: Secondary | ICD-10-CM | POA: Diagnosis not present

## 2019-10-13 DIAGNOSIS — I10 Essential (primary) hypertension: Secondary | ICD-10-CM | POA: Diagnosis not present

## 2019-10-13 DIAGNOSIS — Z833 Family history of diabetes mellitus: Secondary | ICD-10-CM

## 2019-10-13 DIAGNOSIS — Z832 Family history of diseases of the blood and blood-forming organs and certain disorders involving the immune mechanism: Secondary | ICD-10-CM

## 2019-10-13 DIAGNOSIS — Z888 Allergy status to other drugs, medicaments and biological substances status: Secondary | ICD-10-CM

## 2019-10-13 LAB — TROPONIN I (HIGH SENSITIVITY)
Troponin I (High Sensitivity): 43 ng/L — ABNORMAL HIGH (ref <18)
Troponin I (High Sensitivity): 46 ng/L — ABNORMAL HIGH (ref <18)
Troponin I (High Sensitivity): 59 ng/L — ABNORMAL HIGH (ref <18)

## 2019-10-13 LAB — BASIC METABOLIC PANEL
Anion gap: 13 (ref 5–15)
BUN: 28 mg/dL — ABNORMAL HIGH (ref 6–20)
CO2: 27 mmol/L (ref 22–32)
Calcium: 8.9 mg/dL (ref 8.9–10.3)
Chloride: 101 mmol/L (ref 98–111)
Creatinine, Ser: 4.2 mg/dL — ABNORMAL HIGH (ref 0.44–1.00)
GFR calc Af Amer: 13 mL/min — ABNORMAL LOW (ref >60)
GFR calc non Af Amer: 11 mL/min — ABNORMAL LOW (ref >60)
Glucose, Bld: 85 mg/dL (ref 70–99)
Potassium: 4.8 mmol/L (ref 3.5–5.1)
Sodium: 141 mmol/L (ref 135–145)

## 2019-10-13 LAB — CBC
HCT: 28.7 % — ABNORMAL LOW (ref 36.0–46.0)
HCT: 29.7 % — ABNORMAL LOW (ref 36.0–46.0)
Hemoglobin: 9.3 g/dL — ABNORMAL LOW (ref 12.0–15.0)
Hemoglobin: 9.7 g/dL — ABNORMAL LOW (ref 12.0–15.0)
MCH: 28.1 pg (ref 26.0–34.0)
MCH: 28.1 pg (ref 26.0–34.0)
MCHC: 32.4 g/dL (ref 30.0–36.0)
MCHC: 32.7 g/dL (ref 30.0–36.0)
MCV: 86.7 fL (ref 80.0–100.0)
MCV: 86.1 fL (ref 80.0–100.0)
Platelets: 201 10*3/uL (ref 150–400)
Platelets: 235 10*3/uL (ref 150–400)
RBC: 3.31 MIL/uL — ABNORMAL LOW (ref 3.87–5.11)
RBC: 3.45 MIL/uL — ABNORMAL LOW (ref 3.87–5.11)
RDW: 15.2 % (ref 11.5–15.5)
RDW: 15.1 % (ref 11.5–15.5)
WBC: 6.4 10*3/uL (ref 4.0–10.5)
WBC: 6.9 10*3/uL (ref 4.0–10.5)
nRBC: 0 % (ref 0.0–0.2)
nRBC: 0 % (ref 0.0–0.2)

## 2019-10-13 LAB — POC OCCULT BLOOD, ED: Fecal Occult Bld: POSITIVE — AB

## 2019-10-13 LAB — PROTIME-INR
INR: 1 (ref 0.8–1.2)
Prothrombin Time: 12.6 seconds (ref 11.4–15.2)

## 2019-10-13 LAB — I-STAT BETA HCG BLOOD, ED (MC, WL, AP ONLY): I-stat hCG, quantitative: 18.4 m[IU]/mL — ABNORMAL HIGH (ref <5)

## 2019-10-13 LAB — SARS CORONAVIRUS 2 BY RT PCR (HOSPITAL ORDER, PERFORMED IN ~~LOC~~ HOSPITAL LAB): SARS Coronavirus 2: NEGATIVE

## 2019-10-13 MED ORDER — METOPROLOL TARTRATE 25 MG PO TABS
25.0000 mg | ORAL_TABLET | Freq: Two times a day (BID) | ORAL | Status: DC
  Administered 2019-10-13 – 2019-10-15 (×5): 25 mg via ORAL
  Filled 2019-10-13 (×5): qty 1

## 2019-10-13 MED ORDER — PANTOPRAZOLE SODIUM 40 MG IV SOLR
40.0000 mg | Freq: Two times a day (BID) | INTRAVENOUS | Status: DC
  Administered 2019-10-13 – 2019-10-16 (×6): 40 mg via INTRAVENOUS
  Filled 2019-10-13 (×6): qty 40

## 2019-10-13 MED ORDER — CINACALCET HCL 30 MG PO TABS
30.0000 mg | ORAL_TABLET | Freq: Every day | ORAL | Status: DC
  Administered 2019-10-14 – 2019-10-15 (×2): 30 mg via ORAL
  Filled 2019-10-13 (×2): qty 1

## 2019-10-13 MED ORDER — LOPERAMIDE HCL 2 MG PO CAPS
2.0000 mg | ORAL_CAPSULE | ORAL | Status: DC | PRN
  Administered 2019-10-13: 2 mg via ORAL
  Filled 2019-10-13: qty 1

## 2019-10-13 MED ORDER — ISOSORBIDE MONONITRATE ER 30 MG PO TB24
15.0000 mg | ORAL_TABLET | Freq: Every day | ORAL | Status: DC
  Administered 2019-10-13 – 2019-10-15 (×3): 15 mg via ORAL
  Filled 2019-10-13 (×4): qty 1

## 2019-10-13 MED ORDER — SODIUM CHLORIDE 0.9% FLUSH
3.0000 mL | Freq: Once | INTRAVENOUS | Status: AC
  Administered 2019-10-15: 3 mL via INTRAVENOUS

## 2019-10-13 MED ORDER — ATORVASTATIN CALCIUM 40 MG PO TABS
40.0000 mg | ORAL_TABLET | Freq: Every day | ORAL | Status: DC
  Administered 2019-10-13 – 2019-10-16 (×4): 40 mg via ORAL
  Filled 2019-10-13 (×4): qty 1

## 2019-10-13 MED ORDER — CALCIUM ACETATE (PHOS BINDER) 667 MG PO CAPS
1334.0000 mg | ORAL_CAPSULE | Freq: Three times a day (TID) | ORAL | Status: DC
  Administered 2019-10-14 – 2019-10-16 (×5): 1334 mg via ORAL
  Filled 2019-10-13 (×7): qty 2

## 2019-10-13 NOTE — ED Provider Notes (Signed)
[facility] EMERGENCY DEPARTMENT Provider Note   CSN: [id number] Arrival date & time: 10/13/19  1140     History Chief Complaint  Patient presents with  . Chest Pain    Karen Morrow is a 59 y.o. female.  59 yo F with a cc of chest pain.  Started this morning.  She has a lot of trouble describing this.  Denies radiation had a little bit of shortness of breath with it.  Patient also had an episode of dark emesis and 2 dark bowel movements.  She has been feeling somewhat weak but thinks it is a chronic issue for her.  She took 1 nitroglycerin and her chest pain resolved.  Now feels fine.  Requesting something to eat and drink.  Nothing seemed to make her chest pain worse earlier.  The nitro made it better.  No diaphoresis.  The history is provided by the patient.  Chest Pain Pain location:  Substernal area Pain quality comment:  Just hurts Pain radiates to:  Does not radiate Pain severity:  Moderate Onset quality:  Sudden Duration:  2 hours Timing:  Constant Progression:  Resolved Chronicity:  New Relieved by:  Nothing Worsened by:  Nothing Ineffective treatments:  None tried Associated symptoms: shortness of breath   Associated symptoms: no dizziness, no fever, no headache, no nausea, no palpitations and no vomiting        Past Medical History:  Diagnosis Date  . Anginal pain ([place])   . Asthma   . Atrial fibrillation ([place])    not on AC  . CHF (congestive heart failure) ([place])   . Chronic kidney disease    dialysis 3x wk  . Dyspnea   . Frequent bowel movements   . GERD (gastroesophageal reflux disease)   . Hepatitis C antibody test positive   . Hypertension    "just dx'd today" (11/03/2012)    Patient Active Problem List   Diagnosis Date Noted  . UGIB (upper gastrointestinal bleed) 10/13/2019  . Acute upper GI bleeding 08/01/2019  . Hypertensive cardiomyopathy, without heart failure ([place]) 03/07/2019  . Fluid overload 11/27/2018  . Acute on  chronic diastolic CHF (congestive heart failure) ([place]) 11/27/2018  . Acute respiratory failure with hypoxia ([place]) 11/27/2018  . Leukocytosis 11/27/2018  . Volume overload 11/27/2018  . Hiatal hernia   . Gastrointestinal bleeding 09/29/2018  . GI bleed 09/29/2018  . Chronic hepatitis C ([place]) 09/23/2018  . Symptomatic anemia 09/16/2018  . Severe systemic inflammatory response syndrome (SIRS) ([place]) 09/16/2018  . Altered mental status 09/16/2018  . Tobacco abuse 09/16/2018  . Chronic diastolic (congestive) heart failure ([place]) 07/07/2018  . Aortic insufficiency 07/07/2018  . Hypertensive urgency 05/13/2018  . Infection of arteriovenous fistula ([place]) 04/25/2018  . MSSA bacteremia 04/25/2018  . Bacteremia   . Staphylococcal sepsis ([place])   . Sepsis ([place]) 03/10/2018  . Hyperkalemia   . Acute respiratory distress   . Pulmonary hypertension due to left ventricular diastolic dysfunction ([place]) 11/08/2016  . Chronic cough 10/15/2016  . Acute GI bleeding 09/07/2016  . Gastroesophageal reflux disease 04/16/2016  . Anemia in chronic kidney disease, on chronic dialysis ([place]) 04/16/2016  . Primary insomnia 04/16/2016  . Shortness of breath 04/16/2016  . Pruritus 12/11/2014  . Hordeolum externum (stye) 12/11/2014  . Xanthelasma of right upper eyelid 12/11/2014  . Dermatitis 10/30/2014  . ESRD on dialysis ([place]) 02/25/2014  . Malnutrition of moderate degree ([place]) 11/28/2013  . HTN (hypertension) 11/26/2013  . Gastric ulcer 06/07/2013  .  Abdominal pain 11/03/2012  . Chest pain 11/03/2012  . Thrombocytopenia ([place]) 11/03/2012  . Elevated transaminase level 11/03/2012  . Hepatic cirrhosis ([place]) 11/03/2012  . Hepatitis C antibody test positive     Past Surgical History:  Procedure Laterality Date  . ABDOMINAL HYSTERECTOMY     "partial" (11/03/2012)  . AV FISTULA PLACEMENT Right 12/03/2013   Procedure: CREATION OF ARTERIOVENOUS (AV) FISTULA RIGHT ARM ;  Surgeon: Rosetta Posner, MD;  Location: [facility];   Service: Vascular;  Laterality: Right;  . AV FISTULA PLACEMENT Left 05/17/2018   Procedure: CREATION OF BRACHIOCEPHALIC FISTULA LEFT ARM;  Surgeon: Waynetta Sandy, MD;  Location: [facility];  Service: Vascular;  Laterality: Left;  . [place] REMOVAL Right 03/16/2018   Procedure: REMOVAL OF ARTERIOVENOUS GORETEX GRAFT ([place]) RIGHT ARM;  Surgeon: Waynetta Sandy, MD;  Location: [facility];  Service: Vascular;  Laterality: Right;  . BASCILIC VEIN TRANSPOSITION Right 03/06/2014   Procedure: SECOND STAGE BASILIC VEIN TRANSPOSITION;  Surgeon: Rosetta Posner, MD;  Location: [facility];  Service: Vascular;  Laterality: Right;  . BIOPSY  09/30/2018   Procedure: BIOPSY;  Surgeon: Mauri Pole, MD;  Location: [facility] ENDOSCOPY;  Service: Endoscopy;;  . BIOPSY  08/01/2019   Procedure: BIOPSY;  Surgeon: Ronald Lobo, MD;  Location: [facility] ENDOSCOPY;  Service: Endoscopy;;  . ENTEROSCOPY N/A 10/06/2018   Procedure: ENTEROSCOPY;  Surgeon: Lavena Bullion, DO;  Location: [facility];  Service: Gastroenterology;  Laterality: N/A;  . ESOPHAGOGASTRODUODENOSCOPY N/A 06/05/2013   Procedure: ESOPHAGOGASTRODUODENOSCOPY (EGD);  Surgeon: Winfield Cunas., MD;  Location: [facility] ENDOSCOPY;  Service: Endoscopy;  Laterality: N/A;  . ESOPHAGOGASTRODUODENOSCOPY N/A 09/08/2016   Procedure: ESOPHAGOGASTRODUODENOSCOPY (EGD);  Surgeon: Milus Banister, MD;  Location: [place];  Service: Endoscopy;  Laterality: N/A;  . ESOPHAGOGASTRODUODENOSCOPY (EGD) WITH PROPOFOL N/A 09/17/2018   Procedure: ESOPHAGOGASTRODUODENOSCOPY (EGD) WITH PROPOFOL;  Surgeon: Carol Ada, MD;  Location: [facility];  Service: Endoscopy;  Laterality: N/A;  . ESOPHAGOGASTRODUODENOSCOPY (EGD) WITH PROPOFOL N/A 09/30/2018   Procedure: ESOPHAGOGASTRODUODENOSCOPY (EGD) WITH PROPOFOL;  Surgeon: Mauri Pole, MD;  Location: [facility] ENDOSCOPY;  Service: Endoscopy;  Laterality: N/A;  . ESOPHAGOGASTRODUODENOSCOPY (EGD) WITH PROPOFOL N/A 08/01/2019   Procedure:  ESOPHAGOGASTRODUODENOSCOPY (EGD) WITH PROPOFOL;  Surgeon: Ronald Lobo, MD;  Location: [facility];  Service: Endoscopy;  Laterality: N/A;  . INSERTION OF DIALYSIS CATHETER Left 03/16/2018   Procedure: PLACEMENT OF TUNNEL DIALYSIS CATHETER;  Surgeon: Waynetta Sandy, MD;  Location: [facility];  Service: Vascular;  Laterality: Left;  . INSERTION OF DIALYSIS CATHETER Right 09/26/2018   Procedure: INSERTION OF DIALYSIS  TUNNELED CATHETER;  Surgeon: Elam Dutch, MD;  [facility];  Service: Vascular;  Laterality: Right;  . IR REMOVAL TUN CV CATH W/O FL  09/17/2018  . REVISON OF ARTERIOVENOUS FISTULA Right 01/28/2017   Procedure: REVISON OF RIGHT ARM ARTERIOVENOUS FISTULA USING 8MMX10CM GORETEX GRAFT;  Surgeon: Angelia Mould, MD;  Location: [facility];  Service: Vascular;  Laterality: Right;     OB History   No obstetric history on file.     Family History  Problem Relation Age of Onset  . Lupus Mother   . Diabetes Father   . Heart attack Father 33    Social History   Tobacco Use  . Smoking status: Current Every Day Smoker    Packs/day: 1.00    Years: 40.00    Pack years: 40.00    Types: Cigarettes  . Smokeless tobacco: Never Used  . Tobacco comment: now  down to 1/2 ppd  Substance Use Topics  . Alcohol use: Not Currently    Alcohol/week: 0.0 standard drinks    Comment: never a heavy drinker, hasn't drank in years  . Drug use: Not Currently    Types: Marijuana    Comment: used to smoke once a day, remote h/o MJ    Home Medications Prior to Admission medications   Medication Sig Start Date End Date Taking? Authorizing Provider  acetaminophen (TYLENOL) 500 MG tablet Take 500-1,000 mg by mouth every 6 (six) hours as needed for headache (pain).   Yes [provider]  albuterol (PROVENTIL HFA;VENTOLIN HFA) 108 (90 Base) MCG/ACT inhaler TAKE 2 PUFFS BY MOUTH EVERY 6 HOURS AS NEEDED FOR WHEEZE OR SHORTNESS OF BREATH Patient taking differently: Inhale 2  puffs into the lungs every 6 (six) hours as needed for wheezing or shortness of breath.  05/09/18  Yes [name], MD  b complex-vitamin c-folic acid (NEPHRO-VITE) 0.8 MG TABS tablet Take 1 tablet by mouth every evening. 07/27/18  Yes [provider]  calcium acetate (PHOSLO) 667 MG capsule Take 1,334 mg by mouth 3 (three) times daily.   Yes [provider]  cinacalcet (SENSIPAR) 30 MG tablet TAKE 1 TABLET BY MOUTH DAILY EVERY EVENING (DO NOT TAKE LESS THAN 12 HOURS PRIOR TO DIALYSIS) 09/19/18  Yes Hosie Poisson, MD  lidocaine-prilocaine (EMLA) cream Apply 1 application topically See admin instructions. 1 application before dialysis. 09/25/19  Yes [provider]  metoprolol tartrate (LOPRESSOR) 25 MG tablet Take 0.5 tablets (12.5 mg total) by mouth 2 (two) times daily. 08/05/19  Yes Mikhail, Maryann, DO  pantoprazole (PROTONIX) 40 MG tablet Take 1 tablet (40 mg total) by mouth 2 (two) times daily. 10/01/18  Yes Mercy Riding, MD    Allergies    Cefazolin, Cephalosporins, and Tuberculin  Review of Systems   Review of Systems  Constitutional: Negative for chills and fever.  HENT: Negative for congestion and rhinorrhea.   Eyes: Negative for redness and visual disturbance.  Respiratory: Positive for shortness of breath. Negative for wheezing.   Cardiovascular: Positive for chest pain. Negative for palpitations.  Gastrointestinal: Positive for blood in stool. Negative for nausea and vomiting.  Genitourinary: Negative for dysuria and urgency.  Musculoskeletal: Negative for arthralgias and myalgias.  Skin: Negative for pallor and wound.  Neurological: Negative for dizziness and headaches.    Physical Exam Updated Vital Signs BP (!) 146/91   Pulse (!) 113   Temp 99.4 F (37.4 C) (Oral)   Resp (!) 21   Ht 5\' 7"  (1.702 m)   Wt 59 kg   SpO2 100%   BMI 20.36 kg/m   Physical Exam Vitals and nursing note reviewed.  Constitutional:      General: She is not in  acute distress.    Appearance: She is well-developed. She is not diaphoretic.  HENT:     Head: Normocephalic and atraumatic.  Eyes:     Pupils: Pupils are equal, round, and reactive to light.  Cardiovascular:     Rate and Rhythm: Normal rate and regular rhythm.     Heart sounds: No murmur. No friction rub. No gallop.   Pulmonary:     Effort: Pulmonary effort is normal.     Breath sounds: No wheezing or rales.  Abdominal:     General: There is no distension.     Palpations: Abdomen is soft.     Tenderness: There is no abdominal tenderness.  Musculoskeletal:  General: No tenderness.     Cervical back: Normal range of motion and neck supple.  Skin:    General: Skin is warm and dry.  Neurological:     Mental Status: She is alert and oriented to person, place, and time.  Psychiatric:        Behavior: Behavior normal.     ED Results / Procedures / Treatments   Labs (all labs ordered are listed, but only abnormal results are displayed) Labs Reviewed  BASIC METABOLIC PANEL - Abnormal; Notable for the following components:      Result Value   BUN 28 (*)    Creatinine, Ser 4.20 (*)    GFR calc non Af Amer 11 (*)    GFR calc Af Amer 13 (*)    All other components within normal limits  CBC - Abnormal; Notable for the following components:   RBC 3.31 (*)    Hemoglobin 9.3 (*)    HCT 28.7 (*)    All other components within normal limits  CBC - Abnormal; Notable for the following components:   RBC 3.45 (*)    Hemoglobin 9.7 (*)    HCT 29.7 (*)    All other components within normal limits  I-STAT BETA HCG BLOOD, ED ([facility], [facility], [facility] ONLY) - Abnormal; Notable for the following components:   I-stat hCG, quantitative 18.4 (*)    All other components within normal limits  POC OCCULT BLOOD, ED - Abnormal; Notable for the following components:   Fecal Occult Bld POSITIVE (*)    All other components within normal limits  TROPONIN I (HIGH SENSITIVITY) - Abnormal; Notable for the  following components:   Troponin I (High Sensitivity) 43 (*)    All other components within normal limits  TROPONIN I (HIGH SENSITIVITY) - Abnormal; Notable for the following components:   Troponin I (High Sensitivity) 46 (*)    All other components within normal limits  TROPONIN I (HIGH SENSITIVITY) - Abnormal; Notable for the following components:   Troponin I (High Sensitivity) 59 (*)    All other components within normal limits  SARS CORONAVIRUS 2 BY RT PCR (HOSPITAL ORDER, [place] [facility])  PROTIME-INR  COMPREHENSIVE METABOLIC PANEL  CBC  CBC  TYPE AND SCREEN  TROPONIN I (HIGH SENSITIVITY)    EKG EKG Interpretation  Date/Time:  Saturday Oct 13 2019 11:45:55 EDT Ventricular Rate:  113 PR Interval:  158 QRS Duration: 74 QT Interval:  350 QTC Calculation: 480 R Axis:   -14 Text Interpretation: Sinus tachycardia with Premature atrial complexes Left ventricular hypertrophy with repolarization abnormality ( R in aVL , Sokolow-Lyon , Cornell product , Romhilt-Estes ) Abnormal ECG st depression in lateral leads now resolved Otherwise no significant change Confirmed by Deno Etienne [phone]) on 10/13/2019 3:06:11 PM   Radiology DG Chest 2 View  Result Date: 10/13/2019 CLINICAL DATA:  Chest pain EXAM: CHEST - 2 VIEW COMPARISON:  12/27/2018 FINDINGS: Previously seen hemodialysis catheter has been removed in the interval. Stable cardiomegaly. Mildly prominent bibasilar interstitial markings. Possible trace bilateral pleural effusions. Lungs are hyperexpanded. No pneumothorax. IMPRESSION: Bibasilar atelectasis versus mild edema. Electronically Signed   By: Davina Poke D.O.   On: 10/13/2019 12:44    Procedures Procedures (including critical care time)  Medications Ordered in ED Medications  sodium chloride flush (NS) 0.9 % injection 3 mL (has no administration in time range)  pantoprazole (PROTONIX) injection 40 mg (has no administration in time range)    cinacalcet (SENSIPAR)  tablet 30 mg (has no administration in time range)  metoprolol tartrate (LOPRESSOR) tablet 25 mg (has no administration in time range)  calcium acetate (PHOSLO) capsule 1,334 mg (has no administration in time range)  atorvastatin (LIPITOR) tablet 40 mg (40 mg Oral Given 10/13/19 1914)  isosorbide mononitrate (IMDUR) 24 hr tablet 15 mg (has no administration in time range)  loperamide (IMODIUM) capsule 2 mg (has no administration in time range)    ED Course  I have reviewed the triage vital signs and the nursing notes.  Pertinent labs & imaging results that were available during my care of the patient were reviewed by me and considered in my medical decision making (see chart for details).    MDM Rules/Calculators/A&P                      59 yo F with a cc of chest pain.  Difficult historian.  Patient has a history of recurrent upper GI bleeds and she is also describing an episode of dark emesis and dark stools x2 this morning.  She has been feeling very fatigued but does not feel that is gotten much worse recently.  Rectal exam without obvious dark and tarry stool.  Will discuss with [facility], Dr Loletha Carrow, recommends medical admission, PPI, npo at midnight and they would see in hospital tomorrow.   The patients results and plan were reviewed and discussed.   Any x-rays performed were independently reviewed by myself.   Differential diagnosis were considered with the presenting HPI.  Medications  sodium chloride flush (NS) 0.9 % injection 3 mL (has no administration in time range)  pantoprazole (PROTONIX) injection 40 mg (has no administration in time range)  cinacalcet (SENSIPAR) tablet 30 mg (has no administration in time range)  metoprolol tartrate (LOPRESSOR) tablet 25 mg (has no administration in time range)  calcium acetate (PHOSLO) capsule 1,334 mg (has no administration in time range)  atorvastatin (LIPITOR) tablet 40 mg (40 mg Oral Given 10/13/19 1914)   isosorbide mononitrate (IMDUR) 24 hr tablet 15 mg (has no administration in time range)  loperamide (IMODIUM) capsule 2 mg (has no administration in time range)    Vitals:   10/13/19 2100 10/13/19 2115 10/13/19 2130 10/13/19 2145  BP: (!) 149/95 111/82 140/86 (!) 146/91  Pulse:      Resp: (!) 21 (!) 21 (!) 21 (!) 21  Temp:      TempSrc:      SpO2:      Weight:      Height:        Final diagnoses:  Upper GI bleed    Admission/ observation were discussed with the admitting physician, patient and/or family and they are comfortable with the plan.     Final Clinical Impression(s) / ED Diagnoses Final diagnoses:  Upper GI bleed    Rx / DC Orders ED Discharge Orders    None       Deno Etienne, DO 10/13/19 2219

## 2019-10-13 NOTE — ED Notes (Signed)
Placed a diaper on the pt at her request.

## 2019-10-13 NOTE — H&P (Addendum)
History and Physical    YUKA LALLIER OVF:[id number] DOB: December 26, 1960 DOA: 10/13/2019  PCP: [name], MD  Patient coming from: Home   I have personally briefly reviewed patient's old medical records in [place]  Chief Complaint: Intermittent chest pain and dark stools. Today was at HD at HD center when developed chest pain.    HPI: GERALDYN SHAIN is a 59 y.o. female with HTN, GERD, ESRD on HD TTS, Afib not on AC due to recurrent UGIB, HFpEF was sent from the dialysis center for evaluation of chest pain as well as report of dark stools.  Patient seems to be difficult historian and for some reason agitated. Apparently 10 minutes into dialysis, she reported of substernal hurting sensation 6-7 out of 10 that was nonradiating and associated with shortness of breath.  It resolved completely with sublingual nitroglycerin she was given at the center.  Questionable report of SVT at the dialysis center.  Furthermore she also complained of one episode of dark brown emesis in a.m. prior to dialysis as well as 2 episodes of dark stools.  She denies any abdominal pain.  No nausea or vomiting except for the episode in a.m.  No diaphoresis.  Dyspnea completely resolved now.   Patient admitted 08/01/2019 to 08/05/2019 for upper GI bleed.  EGD showed no active bleeding but notable for fresh blood in stomach with coffee-ground suggestive of hemorrhagic gastritis.  Moderately large cratered gastric ulcer with slightly dirty base but no visible vessel or adherent clot.  Required 2 unit PRBCs.  Troponin peaked to 1251 during the same admission. She didn't undergo LHC.   Personal and social history: Patient married with 2 grown up children.  Active smoker 0.5 pack/day for many years.  Denies any EtOH use or illicit drug use.  ED Course:  Vitals in the ED with blood pressure 138/87, pulse 113, respiratory rate 14 SPO2 100% room air.  -Labs with sodium 141, potassium 4.8, BUN 28,  creatinine 4.2 -Hb 9.3 closer to her baseline between 9-10 -Troponin I 43--> 46 -Covid negative  -CXR with bibasilar atelectasis versus mild edema. -Sinus tachycardia at 115 bpm, normal axis T wave inversions in 1 aVL.  Intervals within normal limits.  Review of Systems: As per HPI otherwise 10 point review of systems negative.   Review of Systems  Constitutional: Positive for malaise/fatigue. Negative for chills, diaphoresis, fever and weight loss.  HENT: Negative.   Eyes: Negative.   Respiratory: Positive for shortness of breath. Negative for cough, hemoptysis, sputum production and wheezing.   Cardiovascular: Positive for chest pain. Negative for palpitations, orthopnea, claudication, leg swelling and PND.  Gastrointestinal: Positive for blood in stool and vomiting. Negative for abdominal pain, constipation, diarrhea, heartburn, melena and nausea.  Genitourinary: Negative.   Musculoskeletal: Negative.   Skin: Negative for itching and rash.  Neurological: Negative.   Endo/Heme/Allergies: Negative.   Psychiatric/Behavioral: Negative.     Past Medical History:  Diagnosis Date  . Anginal pain ([place])   . Asthma   . Atrial fibrillation ([place])    not on AC  . CHF (congestive heart failure) ([place])   . Chronic kidney disease    dialysis 3x wk  . Dyspnea   . Frequent bowel movements   . GERD (gastroesophageal reflux disease)   . Hepatitis C antibody test positive   . Hypertension    "just dx'd today" (11/03/2012)    Past Surgical History:  Procedure Laterality Date  . ABDOMINAL HYSTERECTOMY     "  partial" (11/03/2012)  . AV FISTULA PLACEMENT Right 12/03/2013   Procedure: CREATION OF ARTERIOVENOUS (AV) FISTULA RIGHT ARM ;  Surgeon: Rosetta Posner, MD;  Location: [place];  Service: Vascular;  Laterality: Right;  . AV FISTULA PLACEMENT Left 05/17/2018   Procedure: CREATION OF BRACHIOCEPHALIC FISTULA LEFT ARM;  Surgeon: Waynetta Sandy, MD;  Location: [place];  Service: Vascular;   Laterality: Left;  . [place] REMOVAL Right 03/16/2018   Procedure: REMOVAL OF ARTERIOVENOUS GORETEX GRAFT ([place]) RIGHT ARM;  Surgeon: Waynetta Sandy, MD;  Location: [place];  Service: Vascular;  Laterality: Right;  . BASCILIC VEIN TRANSPOSITION Right 03/06/2014   Procedure: SECOND STAGE BASILIC VEIN TRANSPOSITION;  Surgeon: Rosetta Posner, MD;  Location: [place];  Service: Vascular;  Laterality: Right;  . BIOPSY  09/30/2018   Procedure: BIOPSY;  Surgeon: Mauri Pole, MD;  Location[facility] ENDOSCOPY;  Service: Endoscopy;;  . BIOPSY  08/01/2019   Procedure: BIOPSY;  Surgeon: Ronald Lobo, MD;  Location: [facility] ENDOSCOPY;  Service: Endoscopy;;  . ENTEROSCOPY N/A 10/06/2018   Procedure: ENTEROSCOPY;  Surgeon: Lavena Bullion, DO;  Location: [place];  Service: Gastroenterology;  Laterality: N/A;  . ESOPHAGOGASTRODUODENOSCOPY N/A 06/05/2013   Procedure: ESOPHAGOGASTRODUODENOSCOPY (EGD);  Surgeon: Winfield Cunas., MD;  Location: [facility] END[facility];  Service: Endoscopy;  Laterality: N/A;  . ESOPHAGOGASTRODUODENOSCOPY N/A 09/08/2016   Procedure: ESOPHAGOGASTRODUODENOSCOPY (EGD);  Surgeon: Milus Banister, MD;  Location: [place];  Service: Endoscopy;  Laterality: N/A;  . ESOPHAGOGASTRODUODENOSCOPY (EGD) WITH PROPOFOL N/A 09/17/2018   Procedure: ESOPHAGOGASTRODUODENOSCOPY (EGD) WITH PROPOFOL;  Surgeon: Carol Ada, MD;  Location: [place];  Service: Endoscopy;  Laterality: N/A;  . ESOPHAGOGASTRODUODENOSCOPY (EGD) WITH PROPOFOL N/A 09/30/2018   Procedure: ESOPHAGOGASTRODUODENOSCOPY (EGD) WITH PROPOFOL;  Surgeon: Mauri Pole, MD;  Location: [place] ENDOSCOPY;  Service: Endoscopy;  Laterality: N/A;  . ESOPHAGOGASTRODUODENOSCOPY (EGD) WITH PROPOFOL N/A 08/01/2019   Procedure: ESOPHAGOGASTRODUODENOSCOPY (EGD) WITH PROPOFOL;  Surgeon: Ronald Lobo, MD;  Location: [place];  Service: Endoscopy;  Laterality: N/A;  . INSERTION OF DIALYSIS CATHETER Left 03/16/2018   Procedure: PLACEMENT OF TUNNEL  DIALYSIS CATHETER;  Surgeon: Waynetta Sandy, MD;  Location: [place];  Service: Vascular;  Laterality: Left;  . INSERTION OF DIALYSIS CATHETER Right 09/26/2018   Procedure: INSERTION OF DIALYSIS  TUNNELED CATHETER;  Surgeon: Elam Dutch, MD;  [facility];  Service: Vascular;  Laterality: Right;  . IR REMOVAL TUN CV CATH W/O FL  09/17/2018  . REVISON OF ARTERIOVENOUS FISTULA Right 01/28/2017   Procedure: REVISON OF RIGHT ARM ARTERIOVENOUS FISTULA USING 8MMX10CM GORETEX GRAFT;  Surgeon: Angelia Mould, MD;  Location: [place];  Service: Vascular;  Laterality: Right;     reports that she has been smoking cigarettes. She has a 40.00 pack-year smoking history. She has never used smokeless tobacco. She reports previous alcohol use. She reports previous drug use. Drug: Marijuana.  Allergies  Allergen Reactions  . Cefazolin Other (See Comments)    Severe thrombocytopenia (has tolerated zosyn in the past) ID aware Tolerated in October 2019  . Cephalosporins Anaphylaxis  . Tuberculin Swelling    Family History  Problem Relation Age of Onset  . Lupus Mother   . Diabetes Father   . Heart attack Father 76    Prior to Admission medications   Medication Sig Start Date End Date Taking? Authorizing Provider  acetaminophen (TYLENOL) 500 MG tablet Take 500-1,000 mg by mouth every 6 (six) hours as needed for headache (pain).   Yes [provider]  albuterol (PROVENTIL HFA;VENTOLIN HFA) 108 (90 Base) MCG/ACT inhaler TAKE 2 PUFFS BY MOUTH EVERY 6 HOURS AS NEEDED FOR WHEEZE OR SHORTNESS OF BREATH Patient taking differently: Inhale 2 puffs into the lungs every 6 (six) hours as needed for wheezing or shortness of breath.  05/09/18  Yes [name], MD  b complex-vitamin c-folic acid (NEPHRO-VITE) 0.8 MG TABS tablet Take 1 tablet by mouth every evening. 07/27/18  Yes [provider]  calcium acetate (PHOSLO) 667 MG capsule Take 1,334 mg by mouth 3 (three) times  daily.   Yes [provider]  lidocaine-prilocaine (EMLA) cream Apply 1 application topically See admin instructions. 1 application before dialysis. 09/25/19  Yes [provider]  pantoprazole (PROTONIX) 40 MG tablet Take 1 tablet (40 mg total) by mouth 2 (two) times daily. 10/01/18  Yes Mercy Riding, MD  cinacalcet (SENSIPAR) 30 MG tablet TAKE 1 TABLET BY MOUTH DAILY EVERY EVENING (DO NOT TAKE LESS THAN 12 HOURS PRIOR TO DIALYSIS) Patient not taking: Reported on 10/13/2019 09/19/18   Hosie Poisson, MD  metoprolol tartrate (LOPRESSOR) 25 MG tablet Take 0.5 tablets (12.5 mg total) by mouth 2 (two) times daily. Patient not taking: Reported on 10/13/2019 08/05/19   Cristal Ford, DO    Physical Exam: Vitals:   10/13/19 1153 10/13/19 1500 10/13/19 1545  BP: 128/87 120/77 (!) 155/87  Pulse: (!) 113 (!) 102 97  Resp: 14 (!) 21 (!) 22  Temp: 99.4 F (37.4 C)    TempSrc: Oral    SpO2: 100% 99% 100%  Weight: 59 kg    Height: 5\' 7"  (1.702 m)      Constitutional: NAD, calm, comfortable Vitals:   10/13/19 1153 10/13/19 1500 10/13/19 1545  BP: 128/87 120/77 (!) 155/87  Pulse: (!) 113 (!) 102 97  Resp: 14 (!) 21 (!) 22  Temp: 99.4 F (37.4 C)    TempSrc: Oral    SpO2: 100% 99% 100%  Weight: 59 kg    Height: 5\' 7"  (1.702 m)        Physical Exam  Constitutional: She is oriented to person, place, and time.  Older for age.  Ill-appearing  HENT:  Head: Normocephalic and atraumatic.  Mouth/Throat: Oropharynx is clear and moist. No oropharyngeal exudate.  Eyes: Pupils are equal, round, and reactive to light. EOM are normal. Right eye exhibits no discharge. Left eye exhibits no discharge.  Neck: No JVD present.  Cardiovascular: Normal rate and regular rhythm. Exam reveals no gallop and no friction rub.  Murmur heard. Pulmonary/Chest: Effort normal and breath sounds normal. No respiratory distress. She has no wheezes. She has no rales. She exhibits no tenderness.  Abdominal:  Soft. Bowel sounds are normal. She exhibits no distension and no mass. There is no abdominal tenderness. There is no rebound and no guarding.  Musculoskeletal:        General: No tenderness, deformity or edema. Normal range of motion.     Cervical back: Normal range of motion and neck supple.     Comments: Left upper extremity AV fistula.  Neurological: She is alert and oriented to person, place, and time.  Skin: Skin is warm and dry. Rash noted. No erythema. No pallor.  Psychiatric: She has a normal mood and affect. Her behavior is normal. Thought content normal.  Vitals reviewed.    Labs on Admission: I have personally reviewed following labs and imaging studies  CBC: Recent Labs  Lab 10/13/19 1149  WBC 6.4  HGB 9.3*  HCT 28.7*  MCV 86.7  PLT 709   Basic Metabolic Panel: Recent Labs  Lab 10/13/19 1149  NA 141  K 4.8  CL 101  CO2 27  GLUCOSE 85  BUN 28*  CREATININE 4.20*  CALCIUM 8.9   GFR: Estimated Creatinine Clearance: 13.4 mL/min (A) (by C-G formula based on SCr of 4.2 mg/dL (H)). Liver Function Tests: No results for input(s): AST, ALT, ALKPHOS, BILITOT, PROT, ALBUMIN in the last 168 hours. No results for input(s): LIPASE, AMYLASE in the last 168 hours. No results for input(s): AMMONIA in the last 168 hours. Coagulation Profile: No results for input(s): INR, PROTIME in the last 168 hours. Cardiac Enzymes: No results for input(s): CKTOTAL, CKMB, CKMBINDEX, TROPONINI in the last 168 hours. BNP (last 3 results) No results for input(s): PROBNP in the last 8760 hours. HbA1C: No results for input(s): HGBA1C in the last 72 hours. CBG: No results for input(s): GLUCAP in the last 168 hours. Lipid Profile: No results for input(s): CHOL, HDL, LDLCALC, TRIG, CHOLHDL, LDLDIRECT in the last 72 hours. Thyroid Function Tests: No results for input(s): TSH, T4TOTAL, FREET4, T3FREE, THYROIDAB in the last 72 hours. Anemia Panel: No results for input(s): VITAMINB12, FOLATE,  FERRITIN, TIBC, IRON, RETICCTPCT in the last 72 hours. Urine analysis:    Component Value Date/Time   COLORURINE YELLOW 11/26/2013 1819   APPEARANCEUR TURBID (A) 11/26/2013 1819   LABSPEC 1.021 11/26/2013 1819   PHURINE 5.5 11/26/2013 1819   GLUCOSEU NEGATIVE 11/26/2013 1819   HGBUR SMALL (A) 11/26/2013 1819   BILIRUBINUR NEGATIVE 11/26/2013 1819   KETONESUR NEGATIVE 11/26/2013 1819   PROTEINUR >300 (A) 11/26/2013 1819   UROBILINOGEN 0.2 11/26/2013 1819   NITRITE POSITIVE (A) 11/26/2013 1819   LEUKOCYTESUR MODERATE (A) 11/26/2013 1819    Radiological Exams on Admission: DG Chest 2 View  Result Date: 10/13/2019 CLINICAL DATA:  Chest pain EXAM: CHEST - 2 VIEW COMPARISON:  12/27/2018 FINDINGS: Previously seen hemodialysis catheter has been removed in the interval. Stable cardiomegaly. Mildly prominent bibasilar interstitial markings. Possible trace bilateral pleural effusions. Lungs are hyperexpanded. No pneumothorax. IMPRESSION: Bibasilar atelectasis versus mild edema. Electronically Signed   By: Davina Poke D.O.   On: 10/13/2019 12:44    EKG: Independently reviewed.  Sinus tachycardia at 113 bpm, normal axis, T wave inversion in 1 aVL.  Assessment/Plan Active Problems:   Chest pain   UGIB (upper gastrointestinal bleed)   Hypertensive cardiomyopathy, without heart failure ([place])    #Chest pain -Troponin flat. 43--46-59 -EKG unremarkable.  -BB increased to 25mg  bid.  Will also help with dynamic LVOT obstruction secondary to [facility]. -Not a candidate for ASA due to GI issues.  --Started Lipitor.  -TTE from 3/21 reviewed.  No wall motion normalities. -Pt not a candidate for ASA/Plavix/Heparin due to h/o frequent UGIB.  -Consider Holter monitoring on discharge to rule out SVT as reported at the dialysis center. -We will consider adding low-dose Imdur for CP if blood pressure not an issue during dialysis.  #Dark stools -Hemodynamics stable. -Protonix and Carafate. -Serial  H&H. -NPO for EGD in am. GI called by ED. D/w [facility], Dr Loletha Carrow.  #HTN -Continue with Norvasc, labetalol.  #ESRD -Continue with hemodialysis Tuesday Thursday and Saturday. -Consider calling nephrology patient ends up staying longer than anticipated.  #Dynamic LVOT -systolic anterior motion with significant LV outflow tract turbulence and a gradient that appears to be in the LVOT with peak of 130 mmHg  DVT prophylaxis: SCDs  code Status: Full code Family Communication:  Patient only  Disposition Plan: Home versus home PT   consults called: GI Admission status: Patient   Deloy Archey MD [facility]   If 7PM-7AM, please contact night-coverage [email or web address] Password [facility]  10/13/2019, 5:17 PM

## 2019-10-13 NOTE — ED Triage Notes (Signed)
Pt arrives via [facility] from dialysis where she had 10 mins of her session and then began having chest pain. Pt received 1 sl nitro from dialysis staff which resolved her pain. Pt also reported black in her stool and 1 episode of emesis with black in it. Pt in SVT initially on EMS 12 lead, converted to sinus tach around 110 spontaneously. A/ox4, resp e/u, nad.

## 2019-10-13 NOTE — ED Notes (Signed)
Please call husband in contact list when pt is in room.

## 2019-10-14 ENCOUNTER — Encounter (HOSPITAL_COMMUNITY): Payer: Self-pay | Admitting: Family Medicine

## 2019-10-14 ENCOUNTER — Inpatient Hospital Stay (HOSPITAL_COMMUNITY): Payer: Medicare Other | Admitting: Anesthesiology

## 2019-10-14 ENCOUNTER — Encounter (HOSPITAL_COMMUNITY)
Admission: EM | Disposition: A | Discharge status: Home / Self Care | Payer: Self-pay | Source: Home / Self Care | Attending: Family Medicine

## 2019-10-14 DIAGNOSIS — K259 Gastric ulcer, unspecified as acute or chronic, without hemorrhage or perforation: Secondary | ICD-10-CM | POA: Diagnosis not present

## 2019-10-14 DIAGNOSIS — D62 Acute posthemorrhagic anemia: Secondary | ICD-10-CM | POA: Diagnosis present

## 2019-10-14 DIAGNOSIS — K2971 Gastritis, unspecified, with bleeding: Secondary | ICD-10-CM | POA: Diagnosis present

## 2019-10-14 DIAGNOSIS — Z992 Dependence on renal dialysis: Secondary | ICD-10-CM | POA: Diagnosis not present

## 2019-10-14 DIAGNOSIS — I259 Chronic ischemic heart disease, unspecified: Secondary | ICD-10-CM | POA: Diagnosis not present

## 2019-10-14 DIAGNOSIS — E785 Hyperlipidemia, unspecified: Secondary | ICD-10-CM | POA: Diagnosis present

## 2019-10-14 DIAGNOSIS — N186 End stage renal disease: Secondary | ICD-10-CM | POA: Diagnosis present

## 2019-10-14 DIAGNOSIS — K920 Hematemesis: Secondary | ICD-10-CM

## 2019-10-14 DIAGNOSIS — K254 Chronic or unspecified gastric ulcer with hemorrhage: Secondary | ICD-10-CM | POA: Diagnosis present

## 2019-10-14 DIAGNOSIS — I119 Hypertensive heart disease without heart failure: Secondary | ICD-10-CM | POA: Diagnosis not present

## 2019-10-14 DIAGNOSIS — F17210 Nicotine dependence, cigarettes, uncomplicated: Secondary | ICD-10-CM | POA: Diagnosis present

## 2019-10-14 DIAGNOSIS — K921 Melena: Secondary | ICD-10-CM

## 2019-10-14 DIAGNOSIS — K922 Gastrointestinal hemorrhage, unspecified: Secondary | ICD-10-CM | POA: Diagnosis not present

## 2019-10-14 DIAGNOSIS — I132 Hypertensive heart and chronic kidney disease with heart failure and with stage 5 chronic kidney disease, or end stage renal disease: Secondary | ICD-10-CM | POA: Diagnosis present

## 2019-10-14 DIAGNOSIS — Z832 Family history of diseases of the blood and blood-forming organs and certain disorders involving the immune mechanism: Secondary | ICD-10-CM | POA: Diagnosis not present

## 2019-10-14 DIAGNOSIS — Z20822 Contact with and (suspected) exposure to covid-19: Secondary | ICD-10-CM | POA: Diagnosis present

## 2019-10-14 DIAGNOSIS — K3189 Other diseases of stomach and duodenum: Secondary | ICD-10-CM | POA: Diagnosis present

## 2019-10-14 DIAGNOSIS — K222 Esophageal obstruction: Secondary | ICD-10-CM

## 2019-10-14 DIAGNOSIS — Z888 Allergy status to other drugs, medicaments and biological substances status: Secondary | ICD-10-CM | POA: Diagnosis not present

## 2019-10-14 DIAGNOSIS — I43 Cardiomyopathy in diseases classified elsewhere: Secondary | ICD-10-CM | POA: Diagnosis present

## 2019-10-14 DIAGNOSIS — D509 Iron deficiency anemia, unspecified: Secondary | ICD-10-CM | POA: Diagnosis not present

## 2019-10-14 DIAGNOSIS — N2581 Secondary hyperparathyroidism of renal origin: Secondary | ICD-10-CM | POA: Diagnosis present

## 2019-10-14 DIAGNOSIS — D631 Anemia in chronic kidney disease: Secondary | ICD-10-CM | POA: Diagnosis present

## 2019-10-14 DIAGNOSIS — I509 Heart failure, unspecified: Secondary | ICD-10-CM | POA: Diagnosis not present

## 2019-10-14 DIAGNOSIS — K219 Gastro-esophageal reflux disease without esophagitis: Secondary | ICD-10-CM | POA: Diagnosis present

## 2019-10-14 DIAGNOSIS — Z8249 Family history of ischemic heart disease and other diseases of the circulatory system: Secondary | ICD-10-CM | POA: Diagnosis not present

## 2019-10-14 DIAGNOSIS — N250 Renal osteodystrophy: Secondary | ICD-10-CM | POA: Diagnosis not present

## 2019-10-14 DIAGNOSIS — M898X9 Other specified disorders of bone, unspecified site: Secondary | ICD-10-CM | POA: Diagnosis present

## 2019-10-14 DIAGNOSIS — I4891 Unspecified atrial fibrillation: Secondary | ICD-10-CM | POA: Diagnosis not present

## 2019-10-14 DIAGNOSIS — E875 Hyperkalemia: Secondary | ICD-10-CM | POA: Diagnosis not present

## 2019-10-14 DIAGNOSIS — I272 Pulmonary hypertension, unspecified: Secondary | ICD-10-CM | POA: Diagnosis present

## 2019-10-14 DIAGNOSIS — Z833 Family history of diabetes mellitus: Secondary | ICD-10-CM | POA: Diagnosis not present

## 2019-10-14 DIAGNOSIS — I5032 Chronic diastolic (congestive) heart failure: Secondary | ICD-10-CM | POA: Diagnosis present

## 2019-10-14 DIAGNOSIS — K2980 Duodenitis without bleeding: Secondary | ICD-10-CM | POA: Diagnosis present

## 2019-10-14 DIAGNOSIS — Z881 Allergy status to other antibiotic agents status: Secondary | ICD-10-CM | POA: Diagnosis not present

## 2019-10-14 DIAGNOSIS — I5033 Acute on chronic diastolic (congestive) heart failure: Secondary | ICD-10-CM | POA: Diagnosis not present

## 2019-10-14 HISTORY — PX: ESOPHAGOGASTRODUODENOSCOPY (EGD) WITH PROPOFOL: SHX5813

## 2019-10-14 HISTORY — PX: BIOPSY: SHX5522

## 2019-10-14 LAB — COMPREHENSIVE METABOLIC PANEL
ALT: 17 U/L (ref 0–44)
AST: 24 U/L (ref 15–41)
Albumin: 2.3 g/dL — ABNORMAL LOW (ref 3.5–5.0)
Alkaline Phosphatase: 78 U/L (ref 38–126)
Anion gap: 13 (ref 5–15)
BUN: 37 mg/dL — ABNORMAL HIGH (ref 6–20)
CO2: 25 mmol/L (ref 22–32)
Calcium: 8.7 mg/dL — ABNORMAL LOW (ref 8.9–10.3)
Chloride: 101 mmol/L (ref 98–111)
Creatinine, Ser: 5.08 mg/dL — ABNORMAL HIGH (ref 0.44–1.00)
GFR calc Af Amer: 10 mL/min — ABNORMAL LOW (ref >60)
GFR calc non Af Amer: 9 mL/min — ABNORMAL LOW (ref >60)
Glucose, Bld: 81 mg/dL (ref 70–99)
Potassium: 4.3 mmol/L (ref 3.5–5.1)
Sodium: 139 mmol/L (ref 135–145)
Total Bilirubin: 0.8 mg/dL (ref 0.3–1.2)
Total Protein: 5.5 g/dL — ABNORMAL LOW (ref 6.5–8.1)

## 2019-10-14 LAB — CBC
HCT: 24.6 % — ABNORMAL LOW (ref 36.0–46.0)
HCT: 23.6 % — ABNORMAL LOW (ref 36.0–46.0)
Hemoglobin: 8.2 g/dL — ABNORMAL LOW (ref 12.0–15.0)
Hemoglobin: 7.9 g/dL — ABNORMAL LOW (ref 12.0–15.0)
MCH: 28.5 pg (ref 26.0–34.0)
MCH: 28.3 pg (ref 26.0–34.0)
MCHC: 33.3 g/dL (ref 30.0–36.0)
MCHC: 33.5 g/dL (ref 30.0–36.0)
MCV: 85.4 fL (ref 80.0–100.0)
MCV: 84.6 fL (ref 80.0–100.0)
Platelets: 190 10*3/uL (ref 150–400)
Platelets: 199 10*3/uL (ref 150–400)
RBC: 2.88 MIL/uL — ABNORMAL LOW (ref 3.87–5.11)
RBC: 2.79 MIL/uL — ABNORMAL LOW (ref 3.87–5.11)
RDW: 15.1 % (ref 11.5–15.5)
RDW: 14.7 % (ref 11.5–15.5)
WBC: 6.2 10*3/uL (ref 4.0–10.5)
WBC: 5.5 10*3/uL (ref 4.0–10.5)
nRBC: 0 % (ref 0.0–0.2)
nRBC: 0 % (ref 0.0–0.2)

## 2019-10-14 LAB — TROPONIN I (HIGH SENSITIVITY)
Troponin I (High Sensitivity): 56 ng/L — ABNORMAL HIGH (ref <18)
Troponin I (High Sensitivity): 44 ng/L — ABNORMAL HIGH (ref <18)
Troponin I (High Sensitivity): 52 ng/L — ABNORMAL HIGH (ref <18)

## 2019-10-14 LAB — GLUCOSE, CAPILLARY
Glucose-Capillary: 80 mg/dL (ref 70–99)
Glucose-Capillary: 80 mg/dL (ref 70–99)
Glucose-Capillary: 71 mg/dL (ref 70–99)
Glucose-Capillary: 106 mg/dL — ABNORMAL HIGH (ref 70–99)
Glucose-Capillary: 73 mg/dL (ref 70–99)

## 2019-10-14 LAB — HEMOGLOBIN AND HEMATOCRIT, BLOOD
HCT: 24.8 % — ABNORMAL LOW (ref 36.0–46.0)
HCT: 24.1 % — ABNORMAL LOW (ref 36.0–46.0)
Hemoglobin: 8.3 g/dL — ABNORMAL LOW (ref 12.0–15.0)
Hemoglobin: 7.8 g/dL — ABNORMAL LOW (ref 12.0–15.0)

## 2019-10-14 SURGERY — ESOPHAGOGASTRODUODENOSCOPY (EGD) WITH PROPOFOL
Anesthesia: Monitor Anesthesia Care

## 2019-10-14 MED ORDER — PROPOFOL 500 MG/50ML IV EMUL
INTRAVENOUS | Status: DC | PRN
  Administered 2019-10-14: 150 ug/kg/min via INTRAVENOUS

## 2019-10-14 MED ORDER — PROPOFOL 10 MG/ML IV BOLUS
INTRAVENOUS | Status: DC | PRN
  Administered 2019-10-14 (×2): 20 mg via INTRAVENOUS

## 2019-10-14 MED ORDER — SODIUM CHLORIDE 0.9 % IV SOLN: INTRAVENOUS | Status: DC | PRN

## 2019-10-14 SURGICAL SUPPLY — 14 items

## 2019-10-14 NOTE — Progress Notes (Signed)
GI called to confirm pt having procedure-confirmed. Pt remains npo. Patient made aware.

## 2019-10-14 NOTE — Progress Notes (Signed)
PROGRESS NOTE  Karen Morrow  YKD:[id number] DOB: 04/13/1961 DOA: 10/13/2019 PCP: [name], MD   Brief Narrative: Karen Morrow is a 59 y.o. female with a history of HFpEF, PAF not on anticoagulation due to GI bleeding, ESRD on HD TTS, hemorrhagic gastritis and gastric ulcer found on admission from 3/3-3/7 when she also required 2u PRBCs. She presented from hemodialysis 5/15 with substernal chest pain 10 minutes into treatment improved with NTG and resolved by time of ED arrival. During the day she had 2 dark stools and 2 dark episodes of emesis. Hgb 9.3g/dl (near baseline), troponin improved from prior at 43, no ST segment elevations on ECG. She was admitted with GI consultation and has had no further bleeding, though hgb dropped to 7.9 g/dl.  Assessment & Plan: Active Problems:   Chest pain   Hypertensive cardiomyopathy, without heart failure (H[facility])   Acute upper GI bleeding   UGIB (upper gastrointestinal bleed)  Recurrent upper GI bleeding, gastritis, gastric ulcer:  - NPO pending EGD, appreciate GI recommendations - PPI IV - Continue serial blood count monitoring with transfusion threshold of 7g/dl or less than 8 with bleeding.  Chest pain: Resolved, troponin mildly elevated, less so than at previous hospitalization.  - Will need ischemic evaluation, but not urgently.  - Continue beta blocker, statin - No ASA with GI bleeding  ESRD: HD TTS.  - Not volume overloaded, uremic, or with severe metabolic derangements. Will d/w nephrology for routine HD if pt remains admitted past tomorrow.  HTN:  - Will restart home medications with elevated BP and no further bleeding.  DVT prophylaxis: SCDs Code Status: Full Family Communication: None at bedside Disposition Plan:  Status is: Inpatient  Remains inpatient appropriate because:Hemodynamically unstable and Ongoing diagnostic testing needed not appropriate for outpatient work up   Dispo: The patient is from: Home         Anticipated d/c is to: Home              Anticipated d/c date is: 1 day              Patient currently is not medically stable to d/c. Requires blood count monitoring in the setting of recurrent upper GI bleeding with possible transfusion.  Consultants:   GI  Procedures:   EGD 10/14/2019  Antimicrobials:  None   Subjective: No further bleeding, denies nausea, vomiting, stools, chest pain, dyspnea or abdominal pain. Wants to eat.  Objective: Vitals:   10/14/19 0007 10/14/19 0010 10/14/19 0218 10/14/19 0353  BP: (!) 144/98 (!) 144/98 (!) 149/95 (!) 150/93  Pulse: 92 94 88 81  Resp:  18 17 18   Temp: 98.4 F (36.9 C) 98.4 F (36.9 C) 98.3 F (36.8 C) 98 F (36.7 C)  TempSrc: Oral Oral Oral Oral  SpO2: 100% 100% 100% 100%  Weight:      Height:       No intake or output data in the 24 hours ending 10/14/19 1216 Filed Weights   10/13/19 1153 10/13/19 2238  Weight: 59 kg 50.7 kg    Gen: 59 y.o. female in no distress Pulm: Non-labored breathing. Clear to auscultation bilaterally.  CV: Regular rate and rhythm. No murmur, rub, or gallop. No JVD, no pedal edema. GI: Abdomen soft, non-tender, non-distended, with normoactive bowel sounds. No organomegaly or masses felt. Ext: Warm, no deformities Skin: No rashes, lesions or ulcers Neuro: Alert and oriented. No focal neurological deficits. Psych: Judgement and insight appear normal. Mood & affect appropriate.  Data Reviewed: I have personally reviewed following labs and imaging studies  CBC: Recent Labs  Lab 10/13/19 1149 10/13/19 1829 10/14/19 0114 10/14/19 0654 10/14/19 1055  WBC 6.4 6.9 6.2 5.5  --   HGB 9.3* 9.7* 8.2* 7.9* 8.3*  HCT 28.7* 29.7* 24.6* 23.6* 24.8*  MCV 86.7 86.1 85.4 84.6  --   PLT 201 235 190 199  --    Basic Metabolic Panel: Recent Labs  Lab 10/13/19 1149 10/14/19 0114  NA 141 139  K 4.8 4.3  CL 101 101  CO2 27 25  GLUCOSE 85 81  BUN 28* 37*  CREATININE 4.20* 5.08*  CALCIUM 8.9  8.7*   GFR: Estimated Creatinine Clearance: 9.5 mL/min (A) (by C-G formula based on SCr of 5.08 mg/dL (H)). Liver Function Tests: Recent Labs  Lab 10/14/19 0114  AST 24  ALT 17  ALKPHOS 78  BILITOT 0.8  PROT 5.5*  ALBUMIN 2.3*   No results for input(s): LIPASE, AMYLASE in the last 168 hours. No results for input(s): AMMONIA in the last 168 hours. Coagulation Profile: Recent Labs  Lab 10/13/19 1829  INR 1.0   Cardiac Enzymes: No results for input(s): CKTOTAL, CKMB, CKMBINDEX, TROPONINI in the last 168 hours. BNP (last 3 results) No results for input(s): PROBNP in the last 8760 hours. HbA1C: No results for input(s): HGBA1C in the last 72 hours. CBG: Recent Labs  Lab 10/14/19 0753 10/14/19 1158  GLUCAP 80 80   Lipid Profile: No results for input(s): CHOL, HDL, LDLCALC, TRIG, CHOLHDL, LDLDIRECT in the last 72 hours. Thyroid Function Tests: No results for input(s): TSH, T4TOTAL, FREET4, T3FREE, THYROIDAB in the last 72 hours. Anemia Panel: No results for input(s): VITAMINB12, FOLATE, FERRITIN, TIBC, IRON, RETICCTPCT in the last 72 hours. Urine analysis:    Component Value Date/Time   COLORURINE YELLOW 11/26/2013 1819   APPEARANCEUR TURBID (A) 11/26/2013 1819   LABSPEC 1.021 11/26/2013 1819   PHURINE 5.5 11/26/2013 1819   GLUCOSEU NEGATIVE 11/26/2013 1819   HGBUR SMALL (A) 11/26/2013 1819   BILIRUBINUR NEGATIVE 11/26/2013 1819   KETONESUR NEGATIVE 11/26/2013 1819   PROTEINUR >300 (A) 11/26/2013 1819   UROBILINOGEN 0.2 11/26/2013 1819   NITRITE POSITIVE (A) 11/26/2013 1819   LEUKOCYTESUR MODERATE (A) 11/26/2013 1819   Recent Results (from the past 240 hour(s))  SARS Coronavirus 2 by RT PCR (hospital order, performed in [facility] hospital lab) Nasopharyngeal Nasopharyngeal Swab     Status: None   Collection Time: 10/13/19  4:14 PM   Specimen: Nasopharyngeal Swab  Result Value Ref Range Status   SARS Coronavirus 2 NEGATIVE NEGATIVE Final    Comment: (NOTE)  SARS-CoV-2 target nucleic acids are NOT DETECTED. The SARS-CoV-2 RNA is generally detectable in upper and lower respiratory specimens during the acute phase of infection. The lowest concentration of SARS-CoV-2 viral copies this assay can detect is 250 copies / mL. A negative result does not preclude SARS-CoV-2 infection and should not be used as the sole basis for treatment or other patient management decisions.  A negative result may occur with improper specimen collection / handling, submission of specimen other than nasopharyngeal swab, presence of viral mutation(s) within the areas targeted by this assay, and inadequate number of viral copies (<250 copies / mL). A negative result must be combined with clinical observations, patient history, and epidemiological information. Fact Sheet for Patients:   StrictlyIdeas.no Fact Sheet for Healthcare Providers: BankingDealers.co.za This test is not yet approved or cleared  by the [place] FDA and  has been authorized for detection and/or diagnosis of SARS-CoV-2 by FDA under an Emergency Use Authorization (EUA).  This EUA will remain in effect (meaning this test can be used) for the duration of the COVID-19 declaration under Section 564(b)(1) of the Act, 21 U.S.C. section 360bbb-3(b)(1), unless the authorization is terminated or revoked sooner. Performed at [facility], [place] [address] [place], [place] [address]       Radiology Studies: DG Chest 2 View  Result Date: 10/13/2019 CLINICAL DATA:  Chest pain EXAM: CHEST - 2 VIEW COMPARISON:  12/27/2018 FINDINGS: Previously seen hemodialysis catheter has been removed in the interval. Stable cardiomegaly. Mildly prominent bibasilar interstitial markings. Possible trace bilateral pleural effusions. Lungs are hyperexpanded. No pneumothorax. IMPRESSION: Bibasilar atelectasis versus mild edema. Electronically Signed   By: Davina Poke D.O.    On: 10/13/2019 12:44    Scheduled Meds: . atorvastatin  40 mg Oral Daily  . calcium acetate  1,334 mg Oral TID with meals  . cinacalcet  30 mg Oral Q supper  . isosorbide mononitrate  15 mg Oral Daily  . metoprolol tartrate  25 mg Oral BID  . pantoprazole (PROTONIX) IV  40 mg Intravenous Q12H  . sodium chloride flush  3 mL Intravenous Once   Continuous Infusions:   LOS: 0 days   Time spent: 25 minutes.  Patrecia Pour, MD [facility] [email or web address] 10/14/2019, 12:17 PM

## 2019-10-14 NOTE — Consult Note (Addendum)
Consultation  Referring Provider:  [facility]/ Bonner Puna Primary Care Physician:  [name], MD Primary Gastroenterologist:  none.  Reason for Consultation:  GI bleed  HPI: Karen Morrow is a 59 y.o. female, admitted last night through the emergency room after she had complained of some chest pain at dialysis relieved by nitroglycerin, then also vomited x1 with dark brown-black material.  She says that she passed dark to black stool yesterday as well. She has history of end-stage renal disease on dialysis, atrial fibrillation not anticoagulated secondary to recurrent GI bleeding, and hypertension. Patient has been hemodynamically stable since admission, minimal elevation of troponin but trending down.  No further hematemesis since admission or stools. COVID-19 negative. Hemoglobin was 9.3 on admission yesterday and 8.2 this morning. BUN 37/creatinine 5. She had recent admission, and on discharge 09/06/2019 hemoglobin was 9.2.  She had been admitted in March 2021 also with GI bleeding and was seen by [facility] at that time.  She underwent upper endoscopy per Dr. Cristina Gong with finding of a 4 cm hiatal hernia, hemorrhagic gastritis and 15 mm cratered gastric ulcer with a dirty appearing base but no definite visible vessel.  She did require 2 unit transfusion at that time. When asked she says she has not been on any ulcer medication at home, "I do not think they gave me any".  She denies any aspirin or NSAID use. She says her stomach has not been bothering her recently up until yesterday.  She also had EGD in May 2020 with finding of mild gastritis and a 3 to 4 mm cratered gastric ulcer and nonbleeding duodenal diverticulum.   Past Medical History:  Diagnosis Date  . Anginal pain ([place])   . Asthma   . Atrial fibrillation ([place])    not on AC  . CHF (congestive heart failure) ([place])   . Chronic kidney disease    dialysis 3x wk  . Dyspnea   . Frequent bowel movements   . GERD  (gastroesophageal reflux disease)   . Hepatitis C antibody test positive   . Hypertension    "just dx'd today" (11/03/2012)    Past Surgical History:  Procedure Laterality Date  . ABDOMINAL HYSTERECTOMY     "partial" (11/03/2012)  . AV FISTULA PLACEMENT Right 12/03/2013   Procedure: CREATION OF ARTERIOVENOUS (AV) FISTULA RIGHT ARM ;  Surgeon: Rosetta Posner, MD;  Location: [place];  Service: Vascular;  Laterality: Right;  . AV FISTULA PLACEMENT Left 05/17/2018   Procedure: CREATION OF BRACHIOCEPHALIC FISTULA LEFT ARM;  Surgeon: Waynetta Sandy, MD;  Location: [place];  Service: Vascular;  Laterality: Left;  . [place] REMOVAL Right 03/16/2018   Procedure: REMOVAL OF ARTERIOVENOUS GORETEX GRAFT ([place]) RIGHT ARM;  Surgeon: Waynetta Sandy, MD;  Location: [place];  Service: Vascular;  Laterality: Right;  . BASCILIC VEIN TRANSPOSITION Right 03/06/2014   Procedure: SECOND STAGE BASILIC VEIN TRANSPOSITION;  Surgeon: Rosetta Posner, MD;  Location: [place];  Service: Vascular;  Laterality: Right;  . BIOPSY  09/30/2018   Procedure: BIOPSY;  Surgeon: Mauri Pole, MD;  Location[facility] ENDOSCOPY;  Service: Endoscopy;;  . BIOPSY  08/01/2019   Procedure: BIOPSY;  Surgeon: Ronald Lobo, MD;  Location: [facility] ENDOSCOPY;  Service: Endoscopy;;  . ENTEROSCOPY N/A 10/06/2018   Procedure: ENTEROSCOPY;  Surgeon: Lavena Bullion, DO;  Location: [place];  Service: Gastroenterology;  Laterality: N/A;  . ESOPHAGOGASTRODUODENOSCOPY N/A 06/05/2013   Procedure: ESOPHAGOGASTRODUODENOSCOPY (EGD);  Surgeon: Winfield Cunas., MD;  Location: [facility] ENDOSCOPY;  Service: Endoscopy;  Laterality: N/A;  . ESOPHAGOGASTRODUODENOSCOPY N/A 09/08/2016   Procedure: ESOPHAGOGASTRODUODENOSCOPY (EGD);  Surgeon: Milus Banister, MD;  Location: [place];  Service: Endoscopy;  Laterality: N/A;  . ESOPHAGOGASTRODUODENOSCOPY (EGD) WITH PROPOFOL N/A 09/17/2018   Procedure: ESOPHAGOGASTRODUODENOSCOPY (EGD) WITH PROPOFOL;  Surgeon: Carol Ada, MD;  Location: [place];  Service: Endoscopy;  Laterality: N/A;  . ESOPHAGOGASTRODUODENOSCOPY (EGD) WITH PROPOFOL N/A 09/30/2018   Procedure: ESOPHAGOGASTRODUODENOSCOPY (EGD) WITH PROPOFOL;  Surgeon: Mauri Pole, MD;  Location: [place] ENDOSCOPY;  Service: Endoscopy;  Laterality: N/A;  . ESOPHAGOGASTRODUODENOSCOPY (EGD) WITH PROPOFOL N/A 08/01/2019   Procedure: ESOPHAGOGASTRODUODENOSCOPY (EGD) WITH PROPOFOL;  Surgeon: Ronald Lobo, MD;  Location: [place];  Service: Endoscopy;  Laterality: N/A;  . INSERTION OF DIALYSIS CATHETER Left 03/16/2018   Procedure: PLACEMENT OF TUNNEL DIALYSIS CATHETER;  Surgeon: Waynetta Sandy, MD;  Location: [place];  Service: Vascular;  Laterality: Left;  . INSERTION OF DIALYSIS CATHETER Right 09/26/2018   Procedure: INSERTION OF DIALYSIS  TUNNELED CATHETER;  Surgeon: Elam Dutch, MD;  Location: [facility] OR;  Service: Vascular;  Laterality: Right;  . IR REMOVAL TUN CV CATH W/O FL  09/17/2018  . REVISON OF ARTERIOVENOUS FISTULA Right 01/28/2017   Procedure: REVISON OF RIGHT ARM ARTERIOVENOUS FISTULA USING 8MMX10CM GORETEX GRAFT;  Surgeon: Angelia Mould, MD;  Location: [place];  Service: Vascular;  Laterality: Right;    Prior to Admission medications   Medication Sig Start Date End Date Taking? Authorizing Provider  acetaminophen (TYLENOL) 500 MG tablet Take 500-1,000 mg by mouth every 6 (six) hours as needed for headache (pain).   Yes [provider]  albuterol (PROVENTIL HFA;VENTOLIN HFA) 108 (90 Base) MCG/ACT inhaler TAKE 2 PUFFS BY MOUTH EVERY 6 HOURS AS NEEDED FOR WHEEZE OR SHORTNESS OF BREATH Patient taking differently: Inhale 2 puffs into the lungs every 6 (six) hours as needed for wheezing or shortness of breath.  05/09/18  Yes [name], MD  b complex-vitamin c-folic acid (NEPHRO-VITE) 0.8 MG TABS tablet Take 1 tablet by mouth every evening. 07/27/18  Yes [provider]  calcium acetate (PHOSLO) 667 MG  capsule Take 1,334 mg by mouth 3 (three) times daily.   Yes [provider]  cinacalcet (SENSIPAR) 30 MG tablet TAKE 1 TABLET BY MOUTH DAILY EVERY EVENING (DO NOT TAKE LESS THAN 12 HOURS PRIOR TO DIALYSIS) 09/19/18  Yes Hosie Poisson, MD  lidocaine-prilocaine (EMLA) cream Apply 1 application topically See admin instructions. 1 application before dialysis. 09/25/19  Yes [provider]  metoprolol tartrate (LOPRESSOR) 25 MG tablet Take 0.5 tablets (12.5 mg total) by mouth 2 (two) times daily. 08/05/19  Yes Mikhail, Maryann, DO  pantoprazole (PROTONIX) 40 MG tablet Take 1 tablet (40 mg total) by mouth 2 (two) times daily. 10/01/18  Yes Mercy Riding, MD    Current Facility-Administered Medications  Medication Dose Route Frequency Provider Last Rate Last Admin  . atorvastatin (LIPITOR) tablet 40 mg  40 mg Oral Daily Mujtaba, Mohammadtokir, MD   40 mg at 10/13/19 1914  . calcium acetate (PHOSLO) capsule 1,334 mg  1,334 mg Oral TID with meals Mujtaba, Mohammadtokir, MD      . cinacalcet (SENSIPAR) tablet 30 mg  30 mg Oral Q supper Mujtaba, Mohammadtokir, MD      . isosorbide mononitrate (IMDUR) 24 hr tablet 15 mg  15 mg Oral Daily Mujtaba, Mohammadtokir, MD   15 mg at 10/13/19 2321  . loperamide (IMODIUM) capsule 2 mg  2  mg Oral PRN Mujtaba, Mohammadtokir, MD   2 mg at 10/13/19 2321  . metoprolol tartrate (LOPRESSOR) tablet 25 mg  25 mg Oral BID Mujtaba, Mohammadtokir, MD   25 mg at 10/13/19 2322  . pantoprazole (PROTONIX) injection 40 mg  40 mg Intravenous Q12H Mujtaba, Mohammadtokir, MD   40 mg at 10/13/19 2323  . sodium chloride flush (NS) 0.9 % injection 3 mL  3 mL Intravenous Once Mujtaba, Mohammadtokir, MD        Allergies as of 10/13/2019 - Review Complete 10/13/2019  Allergen Reaction Noted  . Cefazolin Other (See Comments) 12/05/2013  . Cephalosporins Anaphylaxis 03/07/2019  . Tuberculin Swelling 03/07/2019    Family History  Problem Relation Age of Onset  . Lupus Mother    . Diabetes Father   . Heart attack Father 75    Social History   Socioeconomic History  . Marital status: Married    Spouse name: Not on file  . Number of children: Not on file  . Years of education: Not on file  . Highest education level: Not on file  Occupational History  . Occupation: disabled  Tobacco Use  . Smoking status: Current Every Day Smoker    Packs/day: 1.00    Years: 40.00    Pack years: 40.00    Types: Cigarettes  . Smokeless tobacco: Never Used  . Tobacco comment: now down to 1/2 ppd  Substance and Sexual Activity  . Alcohol use: Not Currently    Alcohol/week: 0.0 standard drinks    Comment: never a heavy drinker, hasn't drank in years  . Drug use: Not Currently    Types: Marijuana    Comment: used to smoke once a day, remote h/o MJ  . Sexual activity: Not Currently  Other Topics Concern  . Not on file  Social History Narrative   Uses [employer] or her husband provides transportation- patient does not drive   Social Determinants of Radio broadcast assistant Strain:   . Difficulty of Paying Living Expenses:   Food Insecurity:   . Worried About Charity fundraiser in the Last Year:   . Arboriculturist in the Last Year:   Transportation Needs:   . Film/video editor (Medical):   Marland Kitchen Lack of Transportation (Non-Medical):   Physical Activity:   . Days of Exercise per Week:   . Minutes of Exercise per Session:   Stress:   . Feeling of Stress :   Social Connections:   . Frequency of Communication with Friends and Family:   . Frequency of Social Gatherings with Friends and Family:   . Attends Religious Services:   . Active Member of Clubs or Organizations:   . Attends Archivist Meetings:   Marland Kitchen Marital Status:   Intimate Partner Violence:   . Fear of Current or Ex-Partner:   . Emotionally Abused:   Marland Kitchen Physically Abused:   . Sexually Abused:     Review of Systems: Pertinent positive and negative review of systems were noted in the above  HPI section.  All other review of systems was otherwise negative.  Physical Exam: Vital signs in last 24 hours: Temp:  [98 F (36.7 C)-99.4 F (37.4 C)] 98 F (36.7 C) (05/16 0353) Pulse Rate:  [81-113] 81 (05/16 0353) Resp:  [14-26] 18 (05/16 0353) BP: (111-182)/(77-116) 150/93 (05/16 0353) SpO2:  [95 %-100 %] 100 % (05/16 0353) Weight:  [50.7 kg-59 kg] 50.7 kg (05/15 2238) Last BM Date: 10/13/19 General:  Alert,  Well-developed, chronically ill-appearing African-American female,  cooperative in NAD Head:  Normocephalic and atraumatic. Eyes:  Sclera clear, no icterus.   Conjunctiva pale Ears:  Normal auditory acuity. Nose:  No deformity, discharge,  or lesions. Mouth:  No deformity or lesions.   Neck:  Supple; no masses or thyromegaly. Lungs:  Clear throughout to auscultation.   No wheezes, crackles, or rhonchi. Heart:  Regular rate and rhythm; no murmurs, clicks, rubs,  or gallops. Abdomen:  Soft,nontender, BS active,nonpalp mass or hsm.   Rectal:  Deferred  Msk:  Symmetrical without gross deformities. . Pulses:  Normal pulses noted. Extremities:  Without clubbing or edema. Neurologic:  Alert and  oriented x4;  grossly normal neurologically. Skin:  Intact without significant lesions or rashes.. Psych:  Alert and cooperative. Normal mood and affect.  Intake/Output from previous day: No intake/output data recorded. Intake/Output this shift: No intake/output data recorded.  Lab Results: Recent Labs    10/13/19 1829 10/14/19 0114 10/14/19 0654  WBC 6.9 6.2 5.5  HGB 9.7* 8.2* 7.9*  HCT 29.7* 24.6* 23.6*  PLT 235 190 199   BMET Recent Labs    10/13/19 1149 10/14/19 0114  NA 141 139  K 4.8 4.3  CL 101 101  CO2 27 25  GLUCOSE 85 81  BUN 28* 37*  CREATININE 4.20* 5.08*  CALCIUM 8.9 8.7*   LFT Recent Labs    10/14/19 0114  PROT 5.5*  ALBUMIN 2.3*  AST 24  ALT 17  ALKPHOS 78  BILITOT 0.8   PT/INR Recent Labs    10/13/19 1829  LABPROT 12.6  INR 1.0    Attending addition: Admission EKG yesterday personally reviewed sinus tachycardia, no apparent ischemic EKG changes in the way of ST elevation or depression.  Troponin 52 this morning  Hepatitis Panel No results for input(s): HEPBSAG, HCVAB, HEPAIGM, HEPBIGM in the last 72 hours.  We will talk to her Supples 1 tablet  IMPRESSION:  #44 59 year old African-American female, with end-stage renal disease on dialysis, and history of recurrent GI bleeding with recurrent versus nonhealing gastric ulcer who was admitted last night after an episode of coffee-ground emesis and dark stools yesterday.  She apparently also had some chest pain during dialysis relieved with nitroglycerin, and does have history of anginal symptoms. Patient has been hemodynamically stable since admission no further active bleeding though hemoglobin has dropped 1 g since yesterday.  Suspect persistent nonhealing gastric ulcer and/or gastritis as source. Unclear whether she has been compliant with PPI use  #2 hypertension #3.  History of atrial fibrillation not anticoagulated  #4 acute on chronic anemia with hemoglobin baseline 9-9.5  Plan; keep n.p.o. this morning Continue IV PPI twice daily Serial hemoglobins and transfuse for hemoglobin 7 or less Patient will be scheduled for upper endoscopy with Dr. Loletha Carrow today.  Procedure was discussed in detail with the patient including indications risks and benefits and she is agreeable to proceed.  Further recommendations pending findings at EGD, thank you will follow with you.     Amy EsterwoodPA-C  10/14/2019, 9:41 AM  I have reviewed the entire case in detail with the above APP and discussed the plan in detail.  Therefore, I agree with the diagnoses recorded above. In addition,  I have personally interviewed and examined the patient and discussed the case with the anesthesia attending as well (Dr. Fransisco Beau)  My additional thoughts are as follows:  Recurrent hematemesis and  melena with acute blood loss anemia on top of anemia of  chronic kidney disease.  Patient is known to have gastric ulcers multiple times in the past, most recently early March of this year.  As near as can be determined, she may not of been on acid suppression therapy so ulcer is still likely present.  I could not get much  in the way of helpful history from the patient since she is aggravated about being unable to eat and being hospitalized in general.  She is not having abdominal pain but says she is just hungry.  She is also denying chest pain right now. ST depression seen on monitor, though with irregular baseline.  Again, not having chest pain right now.  Dr. Fransisco Beau reevaluated patient after my request in this regard, feels these do not represent ACS and we are okay to proceed with upper endoscopy.  Procedure described in detail to patient along with risks and benefits and she agreed.  The benefits and risks of the planned procedure were described in detail with the patient or (when appropriate) their health care proxy.  Risks were outlined as including, but not limited to, bleeding, infection, perforation, adverse medication reaction leading to cardiac or pulmonary decompensation, pancreatitis (if ERCP).  The limitation of incomplete mucosal visualization was also discussed.  No guarantees or warranties were given. Patient at increased risk for cardiopulmonary complications of procedure due to medical comorbidities.    Karen Morrow Office:[phone]

## 2019-10-14 NOTE — Op Note (Signed)
[facility] Patient Name: Karen Morrow Procedure Date : 10/14/2019 MRN: [id number] Attending MD: Estill Cotta. Loletha Carrow , MD Date of Birth: 10-14-1960 CSN: [id number] Age: 59 Admit Type: Inpatient Procedure:                Upper GI endoscopy Indications:              Epigastric abdominal pain, Hematemesis, Melena,                            Chronic gastric ulcer with hemorrhage (several                            admission for same within last year, most recently                            early March - Bx negative for H pylori then. Not                            clear if patient has been taking PPI as recommended) Providers:                Estill Cotta. Loletha Carrow, MD, Burtis Junes, RN, Laverda Sorenson,                            Technician Referring MD:             [facility] Medicines:                Monitored Anesthesia Care Complications:            No immediate complications. Estimated Blood Loss:     Estimated blood loss was minimal. Procedure:                Pre-Anesthesia Assessment:                           - Prior to the procedure, a History and Physical                            was performed, and patient medications and                            allergies were reviewed. The patient's tolerance of                            previous anesthesia was also reviewed. The risks                            and benefits of the procedure and the sedation                            options and risks were discussed with the patient.                            All questions were answered, and informed consent  was obtained. Prior Anticoagulants: The patient has                            taken no previous anticoagulant or antiplatelet                            agents. ASA Grade Assessment: IV - A patient with                            severe systemic disease that is a constant threat                            to life. After reviewing the risks and  benefits,                            the patient was deemed in satisfactory condition to                            undergo the procedure.                           After obtaining informed consent, the endoscope was                            passed under direct vision. Throughout the                            procedure, the patient's blood pressure, pulse, and                            oxygen saturations were monitored continuously. The                            GIF-H190 ([id number]) Olympus gastroscope was                            introduced through the mouth, and advanced to the                            second part of duodenum. The upper GI endoscopy was                            accomplished without difficulty. The patient                            tolerated the procedure well. Scope In: Scope Out: Findings:      One benign-appearing, intrinsic moderate stenosis was found at the       gastroesophageal junction. This stenosis measured 1.4 cm (inner       diameter). The stenosis was traversed.      One non-bleeding cratered gastric ulcer with pigmented material was       found in the gastric antrum. Difficult to completely visualize due to       marked antral edema and nodularity The lesion was  12-15 mm in largest       dimension with friable edges. Biopsies were taken from the edge with a       cold forceps for histology.      Diffuse moderately congested mucosa was found in the entire examined       stomach. The pylorus was initially difficult to localize, but was       discovered and traversed easily with standard EGD scope.      Diffuse moderate inflammation characterized by congestion (edema) and       erythema was found in the duodenal bulb.      The exam of the duodenum was otherwise normal. Impression:               - Benign-appearing esophageal stenosis.                           - Non-bleeding gastric ulcer with pigmented                            material.  Biopsied.                           - Congestive gastropathy.                           - Duodenitis. Moderate Sedation:      MAC sedation used Recommendation:           - Return patient to hospital ward for ongoing care.                           - Soft diet.                           - Continue present medications.                           - Await pathology results. Procedure Code(s):        --- Professional ---                           [phone], Esophagogastroduodenoscopy, flexible,                            transoral; with biopsy, single or multiple Diagnosis Code(s):        --- Professional ---                           K22.2, Esophageal obstruction                           K25.9, Gastric ulcer, unspecified as acute or                            chronic, without hemorrhage or perforation                           K31.89, Other diseases of stomach and duodenum  K29.80, Duodenitis without bleeding                           R10.13, Epigastric pain                           K92.0, Hematemesis                           K92.1, Melena (includes Hematochezia)                           K25.4, Chronic or unspecified gastric ulcer with                            hemorrhage CPT copyright 2019 American Medical Association. All rights reserved. The codes documented in this report are preliminary and upon coder review may  be revised to meet current compliance requirements. Lisett Dirusso L. Loletha Carrow, MD 10/14/2019 1:33:43 PM This report has been signed electronically. Number of Addenda: 0

## 2019-10-14 NOTE — Anesthesia Procedure Notes (Signed)
Procedure Name: MAC Date/Time: 10/14/2019 1:15 PM Performed by: Amadeo Garnet, CRNA Pre-anesthesia Checklist: Patient identified, Emergency Drugs available, Suction available and Patient being monitored Patient Re-evaluated:Patient Re-evaluated prior to induction Oxygen Delivery Method: Nasal cannula Preoxygenation: Pre-oxygenation with 100% oxygen Induction Type: IV induction Placement Confirmation: positive ETCO2 Dental Injury: Teeth and Oropharynx as per pre-operative assessment

## 2019-10-14 NOTE — Plan of Care (Signed)
  Problem: Clinical Measurements: °Goal: Ability to maintain clinical measurements within normal limits will improve °Outcome: Not Progressing °  °Problem: Clinical Measurements: °Goal: Cardiovascular complication will be avoided °Outcome: Not Progressing °  °

## 2019-10-14 NOTE — Anesthesia Postprocedure Evaluation (Signed)
Anesthesia Post Note  Patient: Karen Morrow  Procedure(s) Performed: ESOPHAGOGASTRODUODENOSCOPY (EGD) WITH PROPOFOL (N/A ) BIOPSY     Patient location during evaluation: PACU Anesthesia Type: MAC Level of consciousness: awake and alert Pain management: pain level controlled Vital Signs Assessment: post-procedure vital signs reviewed and stable Respiratory status: spontaneous breathing, nonlabored ventilation and respiratory function stable Cardiovascular status: stable and blood pressure returned to baseline Anesthetic complications: no    Last Vitals:  Vitals:   10/14/19 1340 10/14/19 1355  BP: (!) 116/44 (!) 148/60  Pulse: 91 87  Resp:    Temp:  36.7 C  SpO2: 100% 100%    Last Pain:  Vitals:   10/14/19 1355  TempSrc: Oral  PainSc: 0-No pain                 Audry Pili

## 2019-10-14 NOTE — Transfer of Care (Signed)
Immediate Anesthesia Transfer of Care Note  Patient: Karen Morrow  Procedure(s) Performed: ESOPHAGOGASTRODUODENOSCOPY (EGD) WITH PROPOFOL (N/A ) BIOPSY  Patient Location: Endoscopy Unit  Anesthesia Type:MAC  Level of Consciousness: drowsy  Airway & Oxygen Therapy: Patient Spontanous Breathing and Patient connected to nasal cannula oxygen  Post-op Assessment: Report given to RN, Post -op Vital signs reviewed and stable and Patient moving all extremities  Post vital signs: Reviewed and stable  Last Vitals:  Vitals Value Taken Time  BP    Temp    Pulse    Resp    SpO2      Last Pain:  Vitals:   10/14/19 1237  TempSrc: Oral  PainSc: 0-No pain         Complications: No apparent anesthesia complications

## 2019-10-14 NOTE — Anesthesia Preprocedure Evaluation (Addendum)
Anesthesia Evaluation  Patient identified by MRN, date of birth, ID band Patient awake    Reviewed: Allergy & Precautions, NPO status , Patient's Chart, lab work & pertinent test results  Airway Mallampati: II  TM Distance: >3 FB Neck ROM: Full    Dental  (+) Poor Dentition, Missing, Chipped   Pulmonary shortness of breath and with exertion, asthma , Current Smoker and Patient abstained from smoking.,  Chronic cough   Pulmonary exam normal        Cardiovascular hypertension, Pt. on medications and Pt. on home beta blockers +CHF  + dysrhythmias Atrial Fibrillation + Valvular Problems/Murmurs MR and AI  Rhythm:Regular Rate:Normal + Systolic murmurs and + Diastolic murmurs  '21 TTE - EF 65 to 70%. Severe left ventricular hypertrophy that appears concentric. Grade II diastolic dysfunction (pseudonormalization). There is mitral valve systolic anterior motion with significant LV outflow tract turbulence and a gradient that appears to be in the LVOT (and not MR) with peak 130 mmHg. LA was moderately dilated. Mild to moderate mitral valve regurgitation. Mild  mitral stenosis. The mean mitral valve gradient is 4.0 mmHg. MVA by PHT 2.17 cm^2. Moderate AI. Mild aortic valve sclerosis is present, with no evidence of aortic valve  stenosis.     Neuro/Psych negative neurological ROS     GI/Hepatic hiatal hernia, PUD, GERD  Medicated and Controlled,(+) Hepatitis -, C GIB    Endo/Other  negative endocrine ROS  Renal/GU ESRF and DialysisRenal disease  negative genitourinary   Musculoskeletal negative musculoskeletal ROS (+)   Abdominal   Peds  Hematology  (+) anemia ,   Anesthesia Other Findings Covid neg 5/15  Reproductive/Obstetrics                            Anesthesia Physical  Anesthesia Plan  ASA: IV  Anesthesia Plan: MAC   Post-op Pain Management:    Induction: Intravenous  PONV Risk Score  and Plan: 2 and Propofol infusion and Treatment may vary due to age or medical condition  Airway Management Planned: Natural Airway and Nasal Cannula  Additional Equipment: None  Intra-op Plan:   Post-operative Plan:   Informed Consent: I have reviewed the patients History and Physical, chart, labs and discussed the procedure including the risks, benefits and alternatives for the proposed anesthesia with the patient or authorized representative who has indicated his/her understanding and acceptance.       Plan Discussed with: CRNA and Anesthesiologist  Anesthesia Plan Comments:        Anesthesia Quick Evaluation

## 2019-10-14 NOTE — Progress Notes (Signed)
Pt request for this nurse to call husband back, permission to speak on status and plan, request for undergarments and clothing. Pt husband was made aware that patient is not able to have anything by mouth at this time.

## 2019-10-14 NOTE — Progress Notes (Signed)
Called Endo to give report-n/a.

## 2019-10-15 ENCOUNTER — Other Ambulatory Visit: Payer: Self-pay | Admitting: Physician Assistant

## 2019-10-15 DIAGNOSIS — K922 Gastrointestinal hemorrhage, unspecified: Secondary | ICD-10-CM

## 2019-10-15 LAB — RENAL FUNCTION PANEL
Albumin: 2.3 g/dL — ABNORMAL LOW (ref 3.5–5.0)
Anion gap: 13 (ref 5–15)
BUN: 44 mg/dL — ABNORMAL HIGH (ref 6–20)
CO2: 24 mmol/L (ref 22–32)
Calcium: 8.7 mg/dL — ABNORMAL LOW (ref 8.9–10.3)
Chloride: 101 mmol/L (ref 98–111)
Creatinine, Ser: 6.34 mg/dL — ABNORMAL HIGH (ref 0.44–1.00)
GFR calc Af Amer: 8 mL/min — ABNORMAL LOW (ref >60)
GFR calc non Af Amer: 7 mL/min — ABNORMAL LOW (ref >60)
Glucose, Bld: 81 mg/dL (ref 70–99)
Phosphorus: 8 mg/dL — ABNORMAL HIGH (ref 2.5–4.6)
Potassium: 4.7 mmol/L (ref 3.5–5.1)
Sodium: 138 mmol/L (ref 135–145)

## 2019-10-15 LAB — GLUCOSE, CAPILLARY
Glucose-Capillary: 107 mg/dL — ABNORMAL HIGH (ref 70–99)
Glucose-Capillary: 119 mg/dL — ABNORMAL HIGH (ref 70–99)
Glucose-Capillary: 74 mg/dL (ref 70–99)
Glucose-Capillary: 84 mg/dL (ref 70–99)
Glucose-Capillary: 89 mg/dL (ref 70–99)
Glucose-Capillary: 90 mg/dL (ref 70–99)
Glucose-Capillary: 92 mg/dL (ref 70–99)

## 2019-10-15 LAB — HEMOGLOBIN AND HEMATOCRIT, BLOOD
HCT: 22.6 % — ABNORMAL LOW (ref 36.0–46.0)
Hemoglobin: 7.6 g/dL — ABNORMAL LOW (ref 12.0–15.0)

## 2019-10-15 LAB — PREPARE RBC (CROSSMATCH)

## 2019-10-15 MED ORDER — CHLORHEXIDINE GLUCONATE CLOTH 2 % EX PADS: 6.0000 | MEDICATED_PAD | Freq: Every day | CUTANEOUS | Status: DC

## 2019-10-15 MED ORDER — DARBEPOETIN ALFA 60 MCG/0.3ML IJ SOSY
60.0000 ug | PREFILLED_SYRINGE | INTRAMUSCULAR | Status: DC
  Filled 2019-10-15: qty 0.3

## 2019-10-15 MED ORDER — SODIUM CHLORIDE 0.9% IV SOLUTION: Freq: Once | INTRAVENOUS | Status: AC

## 2019-10-15 NOTE — TOC Initial Note (Signed)
Transition of Care [facility]) - Initial/Assessment Note    Patient Details  Name: Karen Morrow MRN: [id number] Date of Birth: 01-08-1961  [facility] CM/SW Contact:    Angelita Ingles, RN Phone Number: [phone]  10/15/2019, 3:58 PM  Clinical Narrative:   CM at bedside for high risk re admission assessment. Patient states that she has a primary care provider and does not want  CM to schedule post discharge appointment. Patient reports that she lives at home with husband and is independent with ADL's. Patient has a cane and walker but denies any issues with getting around within her home or ambulation.Patient states that she is not in need of any other DME and has adequate support from her husband. No needs at this time. CM will continue to follow patient for any needs.                 Expected Discharge Plan: Home/Self Care Barriers to Discharge: Continued Medical Work up   Patient Goals and CMS Choice Patient states their goals for this hospitalization and ongoing recovery are:: Wants to go home tomorrow hopefully   Choice offered to / list presented to : NA  Expected Discharge Plan and Services Expected Discharge Plan: Home/Self Care In-house Referral: NA Discharge Planning Services: CM Consult Post Acute Care Choice: NA Living arrangements for the past 2 months: Single Family Home                 DME Arranged: N/A DME Agency: NA       HH Arranged: NA HH Agency: NA        Prior Living Arrangements/Services Living arrangements for the past 2 months: Single Family Home Lives with:: Spouse Patient language and need for interpreter reviewed:: Yes Do you feel safe going back to the place where you live?: Yes      Need for Family Participation in Patient Care: Yes (Comment) Care giver support system in place?: Yes (comment) Current home services: (none) Criminal Activity/Legal Involvement Pertinent to Current Situation/Hospitalization: No - Comment as  needed  Activities of Daily Living Home Assistive Devices/Equipment: None ADL Screening (condition at time of admission) Patient's cognitive ability adequate to safely complete daily activities?: Yes Is the patient deaf or have difficulty hearing?: No Does the patient have difficulty seeing, even when wearing glasses/contacts?: No Does the patient have difficulty concentrating, remembering, or making decisions?: No Patient able to express need for assistance with ADLs?: No Does the patient have difficulty dressing or bathing?: No Independently performs ADLs?: Yes (appropriate for developmental age) Does the patient have difficulty walking or climbing stairs?: No Weakness of Legs: None Weakness of Arms/Hands: None  Permission Sought/Granted Permission sought to share information with : Family Supports Permission granted to share information with : Yes, Verbal Permission Granted  Share Information with NAME: Brigett Estell     Permission granted to share info w Relationship: husband     Emotional Assessment Appearance:: Appears stated age Attitude/Demeanor/Rapport: Avoidant Affect (typically observed): Withdrawn Orientation: : Oriented to Self, Oriented to Place, Oriented to  Time, Oriented to Situation Alcohol / Substance Use: Not Applicable Psych Involvement: No (comment)  Admission diagnosis:  Upper GI bleed [K92.2] UGIB (upper gastrointestinal bleed) [K92.2] Acute upper GI bleeding [K92.2] Patient Active Problem List   Diagnosis Date Noted  . UGIB (upper gastrointestinal bleed) 10/13/2019  . Acute upper GI bleeding 08/01/2019  . Hypertensive cardiomyopathy, without heart failure ([place]) 03/07/2019  . Fluid overload 11/27/2018  . Acute on  chronic diastolic CHF (congestive heart failure) ([place]) 11/27/2018  . Acute respiratory failure with hypoxia ([place]) 11/27/2018  . Leukocytosis 11/27/2018  . Volume overload 11/27/2018  . Hiatal hernia   . Gastrointestinal bleeding  09/29/2018  . GI bleed 09/29/2018  . Chronic hepatitis C ([place]) 09/23/2018  . Symptomatic anemia 09/16/2018  . Severe systemic inflammatory response syndrome (SIRS) ([place]) 09/16/2018  . Altered mental status 09/16/2018  . Tobacco abuse 09/16/2018  . Chronic diastolic (congestive) heart failure ([place]) 07/07/2018  . Aortic insufficiency 07/07/2018  . Hypertensive urgency 05/13/2018  . Infection of arteriovenous fistula ([place]) 04/25/2018  . MSSA bacteremia 04/25/2018  . Bacteremia   . Staphylococcal sepsis ([place])   . Sepsis ([place]) 03/10/2018  . Hyperkalemia   . Acute respiratory distress   . Pulmonary hypertension due to left ventricular diastolic dysfunction ([place]) 11/08/2016  . Chronic cough 10/15/2016  . Acute GI bleeding 09/07/2016  . Gastroesophageal reflux disease 04/16/2016  . Anemia in chronic kidney disease, on chronic dialysis ([place]) 04/16/2016  . Primary insomnia 04/16/2016  . Shortness of breath 04/16/2016  . Pruritus 12/11/2014  . Hordeolum externum (stye) 12/11/2014  . Xanthelasma of right upper eyelid 12/11/2014  . Dermatitis 10/30/2014  . ESRD on dialysis ([place]) 02/25/2014  . Malnutrition of moderate degree ([place]) 11/28/2013  . HTN (hypertension) 11/26/2013  . Gastric ulcer 06/07/2013  . Abdominal pain 11/03/2012  . Chest pain 11/03/2012  . Thrombocytopenia ([place]) 11/03/2012  . Elevated transaminase level 11/03/2012  . Hepatic cirrhosis ([place]) 11/03/2012  . Hepatitis C antibody test positive    PCP:  [name], MD Pharmacy:   [id number] [address], [place] [address] Phone: [phone] Fax: [phone]     Social Determinants of Health (SDOH) Interventions    Readmission Risk Interventions Readmission Risk Prevention Plan 10/15/2019 10/07/2018 10/01/2018  Transportation Screening Complete Complete -  PCP or Specialist Appt within 3-5 Days Complete - -  [place] or [place] Patient  refused - -  Social Work Consult for [place] Planning/Counseling Complete - -  [place] Screening Not Applicable - -  Medication Review Press photographer) Referral to Pharmacy Complete -  PCP or Specialist appointment within 3-5 days of discharge - Not Complete -  PCP/Specialist Appt Not Complete comments - unable to schedule appointment on weekend -  [place] or [place] - Complete Complete  SW Recovery Care/Counseling Consult - Complete -  [place] - Not Applicable -  Some recent data might be hidden

## 2019-10-15 NOTE — Consult Note (Signed)
[facility] Renal Consultation Note    Indication for Consultation:  Management of ESRD/hemodialysis, anemia, hypertension/volume, and secondary hyperparathyroidism.  HPI: Karen Morrow is a 59 y.o. female with PMH including ESRD on dialysis, a. Fib ,CHF and recurrent GI bleed who presented to the ED on 10/13/19 with substernal pain and two episodes of dark stools and emesis. Hgb was 9.3 on arrival (recent outpatient baseline 10-11) but dropped to 7.9 > 7.6 without any further bleeding noted. GI was consulted and competed EGD on 10/14/19 which showed a gastric antral ulcer which did not require endoscopic therapy, biopsies taken. Patient does not think she was on a PPI prior to admission and denies any recent use of NSAIDs. She was admitted overnight for further observation.  Patient dialyzes on TTS schedule. She did complete her last dialysis on Saturday without any complications. K+ 4.7, BUN 44, Cr 6.34. Patient denies any SOB, orthopnea, CP, palpitations, dizziness, abdominal pain, N/V/D. Reports she has not had any further bleeding since the episode prior to ED presentation.  BP has been slightly elevated by vital signs otherwise stable.   Past Medical History:  Diagnosis Date  . Anginal pain ([place])   . Asthma   . Atrial fibrillation ([place])    not on AC  . CHF (congestive heart failure) ([place])   . Chronic kidney disease    dialysis 3x wk  . Dyspnea   . Frequent bowel movements   . GERD (gastroesophageal reflux disease)   . Hepatitis C antibody test positive   . Hypertension    "just dx'd today" (11/03/2012)   Past Surgical History:  Procedure Laterality Date  . ABDOMINAL HYSTERECTOMY     "partial" (11/03/2012)  . AV FISTULA PLACEMENT Right 12/03/2013   Procedure: CREATION OF ARTERIOVENOUS (AV) FISTULA RIGHT ARM ;  Surgeon: Rosetta Posner, MD;  Location: [place];  Service: Vascular;  Laterality: Right;  . AV FISTULA PLACEMENT Left 05/17/2018   Procedure: CREATION OF  BRACHIOCEPHALIC FISTULA LEFT ARM;  Surgeon: Waynetta Sandy, MD;  Location: [place];  Service: Vascular;  Laterality: Left;  . [place] REMOVAL Right 03/16/2018   Procedure: REMOVAL OF ARTERIOVENOUS GORETEX GRAFT ([place]) RIGHT ARM;  Surgeon: Waynetta Sandy, MD;  Location: [place];  Service: Vascular;  Laterality: Right;  . BASCILIC VEIN TRANSPOSITION Right 03/06/2014   Procedure: SECOND STAGE BASILIC VEIN TRANSPOSITION;  Surgeon: Rosetta Posner, MD;  Location: [place];  Service: Vascular;  Laterality: Right;  . BIOPSY  09/30/2018   Procedure: BIOPSY;  Surgeon: Mauri Pole, MD;  Location: [facility] ENDOSCOPY;  Service: Endoscopy;;  . BIOPSY  08/01/2019   Procedure: BIOPSY;  Surgeon: Ronald Lobo, MD;  Location[facility];  Service: Endoscopy;;  . BIOPSY  10/14/2019   Procedure: BIOPSY;  Surgeon: Doran Stabler, MD;  Location: [facility] ENDOSCOPY;  Service: Gastroenterology;;  . ENTEROSCOPY N/A 10/06/2018   Procedure: ENTEROSCOPY;  Surgeon: Lavena Bullion, DO;  Location: [place];  Service: Gastroenterology;  Laterality: N/A;  . ESOPHAGOGASTRODUODENOSCOPY N/A 06/05/2013   Procedure: ESOPHAGOGASTRODUODENOSCOPY (EGD);  Surgeon: Winfield Cunas., MD;  Location: [facility];  Service: Endoscopy;  Laterality: N/A;  . ESOPHAGOGASTRODUODENOSCOPY N/A 09/08/2016   Procedure: ESOPHAGOGASTRODUODENOSCOPY (EGD);  Surgeon: Milus Banister, MD;  Location: [place];  Service: Endoscopy;  Laterality: N/A;  . ESOPHAGOGASTRODUODENOSCOPY (EGD) WITH PROPOFOL N/A 09/17/2018   Procedure: ESOPHAGOGASTRODUODENOSCOPY (EGD) WITH PROPOFOL;  Surgeon: Carol Ada, MD;  Location: [place];  Service: Endoscopy;  Laterality: N/A;  . ESOPHAGOGASTRODUODENOSCOPY (  EGD) WITH PROPOFOL N/A 09/30/2018   Procedure: ESOPHAGOGASTRODUODENOSCOPY (EGD) WITH PROPOFOL;  Surgeon: Mauri Pole, MD;  Location: [place] ENDOSCOPY;  Service: Endoscopy;  Laterality: N/A;  . ESOPHAGOGASTRODUODENOSCOPY (EGD) WITH PROPOFOL N/A  08/01/2019   Procedure: ESOPHAGOGASTRODUODENOSCOPY (EGD) WITH PROPOFOL;  Surgeon: Ronald Lobo, MD;  Location: [place];  Service: Endoscopy;  Laterality: N/A;  . ESOPHAGOGASTRODUODENOSCOPY (EGD) WITH PROPOFOL N/A 10/14/2019   Procedure: ESOPHAGOGASTRODUODENOSCOPY (EGD) WITH PROPOFOL;  Surgeon: Doran Stabler, MD;  Location: [place];  Service: Gastroenterology;  Laterality: N/A;  . INSERTION OF DIALYSIS CATHETER Left 03/16/2018   Procedure: PLACEMENT OF TUNNEL DIALYSIS CATHETER;  Surgeon: Waynetta Sandy, MD;  Location: [place];  Service: Vascular;  Laterality: Left;  . INSERTION OF DIALYSIS CATHETER Right 09/26/2018   Procedure: INSERTION OF DIALYSIS  TUNNELED CATHETER;  Surgeon: Elam Dutch, MD;  Location: [facility] OR;  Service: Vascular;  Laterality: Right;  . IR REMOVAL TUN CV CATH W/O FL  09/17/2018  . REVISON OF ARTERIOVENOUS FISTULA Right 01/28/2017   Procedure: REVISON OF RIGHT ARM ARTERIOVENOUS FISTULA USING 8MMX10CM GORETEX GRAFT;  Surgeon: Angelia Mould, MD;  Location: [facility] OR;  Service: Vascular;  Laterality: Right;   Family History  Problem Relation Age of Onset  . Lupus Mother   . Diabetes Father   . Heart attack Father 64   Social History:  reports that she has been smoking cigarettes. She has a 40.00 pack-year smoking history. She has never used smokeless tobacco. She reports previous alcohol use. She reports previous drug use. Drug: Marijuana.  ROS: As per HPI otherwise negative.  Physical Exam: Vitals:   10/15/19 0430 10/15/19 0815 10/15/19 1110 10/15/19 1125  BP:  124/78 120/77 140/68  Pulse:  81 77 75  Resp: 20  17 18   Temp:  98.7 F (37.1 C) 97.9 F (36.6 C) 97.9 F (36.6 C)  TempSrc:   Oral Oral  SpO2:  100% 100% 100%  Weight:      Height:         General: Well developed, chronically ill appearing female, in no acute distress. Head: Normocephalic, atraumatic, sclera non-icteric, mucus membranes are moist. Neck: . JVD not  elevated. Lungs: Clear bilaterally to auscultation without wheezes, rales, or rhonchi. Breathing is unlabored. Heart: RRR with normal S1, S2. No murmurs, rubs, or gallops appreciated. Abdomen: Soft, non-tender, non-distended with normoactive bowel sounds. No rebound/guarding. No obvious abdominal masses. Musculoskeletal:  Strength and tone appear normal for age. Lower extremities: No edema or ischemic changes, no open wounds. Neuro: Alert and oriented X 3. Moves all extremities spontaneously. Psych:  Responds to questions appropriately with a normal affect. Dialysis Access: LUE AVF + bruit  Allergies  Allergen Reactions  . Cefazolin Other (See Comments)    Severe thrombocytopenia (has tolerated zosyn in the past) ID aware Tolerated in October 2019  . Cephalosporins Anaphylaxis  . Tuberculin Swelling   Prior to Admission medications   Medication Sig Start Date End Date Taking? Authorizing Provider  acetaminophen (TYLENOL) 500 MG tablet Take 500-1,000 mg by mouth every 6 (six) hours as needed for headache (pain).   Yes [provider]  albuterol (PROVENTIL HFA;VENTOLIN HFA) 108 (90 Base) MCG/ACT inhaler TAKE 2 PUFFS BY MOUTH EVERY 6 HOURS AS NEEDED FOR WHEEZE OR SHORTNESS OF BREATH Patient taking differently: Inhale 2 puffs into the lungs every 6 (six) hours as needed for wheezing or shortness of breath.  05/09/18  Yes [name], MD  b complex-vitamin c-folic acid (NEPHRO-VITE) 0.8  MG TABS tablet Take 1 tablet by mouth every evening. 07/27/18  Yes [provider]  calcium acetate (PHOSLO) 667 MG capsule Take 1,334 mg by mouth 3 (three) times daily.   Yes [provider]  cinacalcet (SENSIPAR) 30 MG tablet TAKE 1 TABLET BY MOUTH DAILY EVERY EVENING (DO NOT TAKE LESS THAN 12 HOURS PRIOR TO DIALYSIS) 09/19/18  Yes Hosie Poisson, MD  lidocaine-prilocaine (EMLA) cream Apply 1 application topically See admin instructions. 1 application before dialysis. 09/25/19  Yes  [provider]  metoprolol tartrate (LOPRESSOR) 25 MG tablet Take 0.5 tablets (12.5 mg total) by mouth 2 (two) times daily. 08/05/19  Yes Mikhail, Maryann, DO  pantoprazole (PROTONIX) 40 MG tablet Take 1 tablet (40 mg total) by mouth 2 (two) times daily. 10/01/18  Yes Mercy Riding, MD   Current Facility-Administered Medications  Medication Dose Route Frequency Provider Last Rate Last Admin  . atorvastatin (LIPITOR) tablet 40 mg  40 mg Oral Daily Nelida Meuse III, MD   40 mg at 10/15/19 0906  . calcium acetate (PHOSLO) capsule 1,334 mg  1,334 mg Oral TID with meals Nelida Meuse III, MD   1,334 mg at 10/15/19 1133  . cinacalcet (SENSIPAR) tablet 30 mg  30 mg Oral Q supper Nelida Meuse III, MD   30 mg at 10/14/19 2053  . isosorbide mononitrate (IMDUR) 24 hr tablet 15 mg  15 mg Oral Daily Nelida Meuse III, MD   15 mg at 10/15/19 0906  . loperamide (IMODIUM) capsule 2 mg  2 mg Oral PRN Nelida Meuse III, MD   2 mg at 10/13/19 2321  . metoprolol tartrate (LOPRESSOR) tablet 25 mg  25 mg Oral BID Nelida Meuse III, MD   25 mg at 10/15/19 0906  . pantoprazole (PROTONIX) injection 40 mg  40 mg Intravenous Q12H Nelida Meuse III, MD   40 mg at 10/15/19 0906  . sodium chloride flush (NS) 0.9 % injection 3 mL  3 mL Intravenous Once Doran Stabler, MD       Labs: Basic Metabolic Panel: Recent Labs  Lab 10/13/19 1149 10/14/19 0114 10/15/19 0238  NA 141 139 138  K 4.8 4.3 4.7  CL 101 101 101  CO2 27 25 24   GLUCOSE 85 81 81  BUN 28* 37* 44*  CREATININE 4.20* 5.08* 6.34*  CALCIUM 8.9 8.7* 8.7*  PHOS  --   --  8.0*   Liver Function Tests: Recent Labs  Lab 10/14/19 0114 10/15/19 0238  AST 24  --   ALT 17  --   ALKPHOS 78  --   BILITOT 0.8  --   PROT 5.5*  --   ALBUMIN 2.3* 2.3*   CBC: Recent Labs  Lab 10/13/19 1149 10/13/19 1149 10/13/19 1829 10/13/19 1829 10/14/19 0114 10/14/19 0114 10/14/19 0654 10/14/19 0654 10/14/19 1055 10/14/19 1627 10/15/19 0238  WBC  6.4   < > 6.9  --  6.2  --  5.5  --   --   --   --   HGB 9.3*   < > 9.7*   < > 8.2*   < > 7.9*   < > 8.3* 7.8* 7.6*  HCT 28.7*   < > 29.7*   < > 24.6*   < > 23.6*   < > 24.8* 24.1* 22.6*  MCV 86.7  --  86.1  --  85.4  --  84.6  --   --   --   --  PLT 201   < > 235  --  190  --  199  --   --   --   --    < > = values in this interval not displayed.   CBG: Recent Labs  Lab 10/14/19 1931 10/15/19 0003 10/15/19 0401 10/15/19 0817 10/15/19 1153  GLUCAP 73 92 74 84 107*    Dialysis Orders: Center: [facility]  on TTS. 180Nre, 3:45hr, BFR 350, DFR 600, EDW 49.5kg, 2K/2.5Ca, AVF 16g, no heparin Hectorol 4 mcg IV q HD Mircera 80mcg IVP q 2 weeks- last dose 09/25/19 Venofer 50mg  IVP q week  Assessment/Plan: 1.  GI bleed/anemia: Had two episodes of dark stools/emesis, EGD showed ulcer that did not require and endoscopic intervention. Plan is for another 24 hours of observation then PPI and repeat EGD in 2 months per GI. Hemoglobin is well below baseline and patient has recurrent admissions for anemia. Given she is ESRD pt requiring ESA therapy, recommend transfusing 1 unit PRBC. Will resume ESA with next HD.  2.  ESRD:  TTS schedule, no indications for urgent HD today. Next HD 5/18- can be completed at her outpatient unit if she is cleared for discharge in time.  3.  Hypertension/volume: BP slightly elevated, euvolemic on exam. UF to EDW with HD tomorrow. 4.  Metabolic bone disease: Calcium 8.7, phos 8.0 (near her outpatient baseline). Continue auryxia and follow phos for improvement outpatient.  5.  Nutrition:  Alb low, will start pro-stat supplement  Anice Paganini, PA-C 10/15/2019, 1:03 PM  [facility] Pager: [phone]

## 2019-10-15 NOTE — Progress Notes (Signed)
PROGRESS NOTE  Karen Morrow  EHM:[id number] DOB: 1961/05/26 DOA: 10/13/2019 PCP: [name], MD   Brief Narrative: Karen Morrow is a 59 y.o. female with a history of HFpEF, PAF not on anticoagulation due to GI bleeding, ESRD on HD TTS, hemorrhagic gastritis and gastric ulcer found on admission from 3/3-3/7 when she also required 2u PRBCs. She presented from hemodialysis 5/15 with substernal chest pain 10 minutes into treatment improved with NTG and resolved by time of ED arrival. During the day she had 2 dark stools and 2 dark episodes of emesis. Hgb 9.3g/dl (near baseline), troponin improved from prior at 43, no ST segment elevations on ECG. She was admitted with GI consultation and has had no further bleeding, though hgb dropped to 7.9 g/dl.  Assessment & Plan: Active Problems:   Chest pain   Hypertensive cardiomyopathy, without heart failure (HCC)   Acute upper GI bleeding   UGIB (upper gastrointestinal bleed)  Recurrent upper GI bleeding, gastritis, gastric ulcer:  - Appreciate GI recommendations, will advance diet, agrees w/1u PRBCs, monitor H/H in AM. Ok to DC if no further bleeding. - Follow up biopsy results. - PPI IV to continue BID x2 months, and indefinitely daily thereafter - Continue to avoid ASA, NSAIDs  Chest pain: Resolved, troponin mildly elevated, less so than at previous hospitalization.  - Will need ischemic evaluation, but not urgently.  - Continue beta blocker, statin - No ASA with GI bleeding  ESRD: HD TTS.  - Not volume overloaded, uremic, or with severe metabolic derangements. Will d/w nephrology for routine HD if pt remains admitted past tomorrow.  HTN:  - Continue   Hypoalbuminemia:  - Prostat  DVT prophylaxis: SCDs Code Status: Full Family Communication: None at bedside Disposition Plan:  Status is: Inpatient  Remains inpatient appropriate because:Hemodynamically unstable and Ongoing diagnostic testing needed not appropriate for  outpatient work up   Dispo: The patient is from: Home              Anticipated d/c is to: Home              Anticipated d/c date is: 1 day              Patient currently is not medically stable to d/c. Requires transfusion and monitoring for recurrent bleeding.   Consultants:   GI  Procedures:   EGD 10/14/2019  Antimicrobials:  None   Subjective: No bleeding, no pain anywhere. No dyspnea, chest pain or palpitations. No swelling.  Objective: Vitals:   10/15/19 0430 10/15/19 0815 10/15/19 1110 10/15/19 1125  BP:  124/78 120/77 140/68  Pulse:  81 77 75  Resp: 20  17 18   Temp:  98.7 F (37.1 C) 97.9 F (36.6 C) 97.9 F (36.6 C)  TempSrc:   Oral Oral  SpO2:  100% 100% 100%  Weight:      Height:        Intake/Output Summary (Last 24 hours) at 10/15/2019 1449 Last data filed at 10/15/2019 1134 Gross per 24 hour  Intake 300 ml  Output --  Net 300 ml   Filed Weights   10/13/19 1153 10/13/19 2238  Weight: 59 kg 50.7 kg   Gen: 59 y.o. female in no distress Pulm: Nonlabored breathing room air. Clear. CV: Regular rate and rhythm. No murmur, rub, or gallop. No JVD, no dependent edema. GI: Abdomen soft, non-tender, non-distended, with normoactive bowel sounds.  Ext: Warm, no deformities. LUE fistula +thrill. Skin: No rashes, lesions or ulcers  on visualized skin. Neuro: Alert and oriented. No focal neurological deficits. Psych: Judgement and insight appear fair. Mood euthymic & affect congruent. Behavior is appropriate.    Data Reviewed: I have personally reviewed following labs and imaging studies  CBC: Recent Labs  Lab 10/13/19 1149 10/13/19 1149 10/13/19 1829 10/13/19 1829 10/14/19 0114 10/14/19 0654 10/14/19 1055 10/14/19 1627 10/15/19 0238  WBC 6.4  --  6.9  --  6.2 5.5  --   --   --   HGB 9.3*   < > 9.7*   < > 8.2* 7.9* 8.3* 7.8* 7.6*  HCT 28.7*   < > 29.7*   < > 24.6* 23.6* 24.8* 24.1* 22.6*  MCV 86.7  --  86.1  --  85.4 84.6  --   --   --   PLT 201   --  235  --  190 199  --   --   --    < > = values in this interval not displayed.   Basic Metabolic Panel: Recent Labs  Lab 10/13/19 1149 10/14/19 0114 10/15/19 0238  NA 141 139 138  K 4.8 4.3 4.7  CL 101 101 101  CO2 27 25 24   GLUCOSE 85 81 81  BUN 28* 37* 44*  CREATININE 4.20* 5.08* 6.34*  CALCIUM 8.9 8.7* 8.7*  PHOS  --   --  8.0*   GFR: Estimated Creatinine Clearance: 7.6 mL/min (A) (by C-G formula based on SCr of 6.34 mg/dL (H)). Liver Function Tests: Recent Labs  Lab 10/14/19 0114 10/15/19 0238  AST 24  --   ALT 17  --   ALKPHOS 78  --   BILITOT 0.8  --   PROT 5.5*  --   ALBUMIN 2.3* 2.3*   No results for input(s): LIPASE, AMYLASE in the last 168 hours. No results for input(s): AMMONIA in the last 168 hours. Coagulation Profile: Recent Labs  Lab 10/13/19 1829  INR 1.0   Cardiac Enzymes: No results for input(s): CKTOTAL, CKMB, CKMBINDEX, TROPONINI in the last 168 hours. BNP (last 3 results) No results for input(s): PROBNP in the last 8760 hours. HbA1C: No results for input(s): HGBA1C in the last 72 hours. CBG: Recent Labs  Lab 10/14/19 1931 10/15/19 0003 10/15/19 0401 10/15/19 0817 10/15/19 1153  GLUCAP 73 92 74 84 107*   Lipid Profile: No results for input(s): CHOL, HDL, LDLCALC, TRIG, CHOLHDL, LDLDIRECT in the last 72 hours. Thyroid Function Tests: No results for input(s): TSH, T4TOTAL, FREET4, T3FREE, THYROIDAB in the last 72 hours. Anemia Panel: No results for input(s): VITAMINB12, FOLATE, FERRITIN, TIBC, IRON, RETICCTPCT in the last 72 hours. Urine analysis:    Component Value Date/Time   COLORURINE YELLOW 11/26/2013 1819   APPEARANCEUR TURBID (A) 11/26/2013 1819   LABSPEC 1.021 11/26/2013 1819   PHURINE 5.5 11/26/2013 1819   GLUCOSEU NEGATIVE 11/26/2013 1819   HGBUR SMALL (A) 11/26/2013 1819   BILIRUBINUR NEGATIVE 11/26/2013 1819   KETONESUR NEGATIVE 11/26/2013 1819   PROTEINUR >300 (A) 11/26/2013 1819   UROBILINOGEN 0.2  11/26/2013 1819   NITRITE POSITIVE (A) 11/26/2013 1819   LEUKOCYTESUR MODERATE (A) 11/26/2013 1819   Recent Results (from the past 240 hour(s))  SARS Coronavirus 2 by RT PCR (hospital order, performed in [place] hospital lab) Nasopharyngeal Nasopharyngeal Swab     Status: None   Collection Time: 10/13/19  4:14 PM   Specimen: Nasopharyngeal Swab  Result Value Ref Range Status   SARS Coronavirus 2 NEGATIVE NEGATIVE Final  Comment: (NOTE) SARS-CoV-2 target nucleic acids are NOT DETECTED. The SARS-CoV-2 RNA is generally detectable in upper and lower respiratory specimens during the acute phase of infection. The lowest concentration of SARS-CoV-2 viral copies this assay can detect is 250 copies / mL. A negative result does not preclude SARS-CoV-2 infection and should not be used as the sole basis for treatment or other patient management decisions.  A negative result may occur with improper specimen collection / handling, submission of specimen other than nasopharyngeal swab, presence of viral mutation(s) within the areas targeted by this assay, and inadequate number of viral copies (<250 copies / mL). A negative result must be combined with clinical observations, patient history, and epidemiological information. Fact Sheet for Patients:   StrictlyIdeas.no Fact Sheet for Healthcare Providers: BankingDealers.co.za This test is not yet approved or cleared  by the [place] FDA and has been authorized for detection and/or diagnosis of SARS-CoV-2 by FDA under an Emergency Use Authorization (EUA).  This EUA will remain in effect (meaning this test can be used) for the duration of the COVID-19 declaration under Section 564(b)(1) of the Act, 21 U.S.C. section 360bbb-3(b)(1), unless the authorization is terminated or revoked sooner. Performed at [facility], [place] [address], [place], [place] [address]       Radiology Studies: No  results found.  Scheduled Meds: . atorvastatin  40 mg Oral Daily  . calcium acetate  1,334 mg Oral TID with meals  . [START ON 10/16/2019] Chlorhexidine Gluconate Cloth  6 each Topical Q0600  . cinacalcet  30 mg Oral Q supper  . [START ON 10/16/2019] darbepoetin (ARANESP) injection - DIALYSIS  60 mcg Intravenous Q Tue-HD  . isosorbide mononitrate  15 mg Oral Daily  . metoprolol tartrate  25 mg Oral BID  . pantoprazole (PROTONIX) IV  40 mg Intravenous Q12H  . sodium chloride flush  3 mL Intravenous Once   Continuous Infusions:   LOS: 1 day   Time spent: 25 minutes.  Patrecia Pour, MD [facility] [email or web address] 10/15/2019, 2:49 PM

## 2019-10-15 NOTE — Progress Notes (Signed)
[facility] Progress Note    Since last GI note: EGD yesterday Dr. Loletha Carrow found a gastric antral ulcer which did not require any endoscopic therapy, biopsies taken.  She isn't sure if she ate dinner last night.  Very clear though that she has no abd pains, no overt GI bleeding (no BM, no hematemesis). Also very clear that she does not take ASA or NSAIDs  Objective: Vital signs in last 24 hours: Temp:  [97.5 F (36.4 C)-98.7 F (37.1 C)] 98.7 F (37.1 C) (05/17 0815) Pulse Rate:  [81-99] 81 (05/17 0815) Resp:  [16-20] 20 (05/17 0430) BP: (107-148)/(44-88) 124/78 (05/17 0815) SpO2:  [100 %] 100 % (05/17 0815) Last BM Date: 10/14/19 General: alert and oriented times 3 Heart: regular rate and rythm Abdomen: soft, non-tender, non-distended, normal bowel sounds   Lab Results: Recent Labs    10/13/19 1829 10/13/19 1829 10/14/19 0114 10/14/19 0114 10/14/19 0654 10/14/19 0654 10/14/19 1055 10/14/19 1627 10/15/19 0238  WBC 6.9  --  6.2  --  5.5  --   --   --   --   HGB 9.7*   < > 8.2*   < > 7.9*   < > 8.3* 7.8* 7.6*  PLT 235  --  190  --  199  --   --   --   --   MCV 86.1  --  85.4  --  84.6  --   --   --   --    < > = values in this interval not displayed.   Recent Labs    10/13/19 1149 10/14/19 0114 10/15/19 0238  NA 141 139 138  K 4.8 4.3 4.7  CL 101 101 101  CO2 27 25 24   GLUCOSE 85 81 81  BUN 28* 37* 44*  CREATININE 4.20* 5.08* 6.34*  CALCIUM 8.9 8.7* 8.7*   Recent Labs    10/14/19 0114 10/15/19 0238  PROT 5.5*  --   ALBUMIN 2.3* 2.3*  AST 24  --   ALT 17  --   ALKPHOS 78  --   BILITOT 0.8  --    Recent Labs    10/13/19 1829  INR 1.0     Studies/Results: DG Chest 2 View  Result Date: 10/13/2019 CLINICAL DATA:  Chest pain EXAM: CHEST - 2 VIEW COMPARISON:  12/27/2018 FINDINGS: Previously seen hemodialysis catheter has been removed in the interval. Stable cardiomegaly. Mildly prominent bibasilar interstitial markings. Possible trace  bilateral pleural effusions. Lungs are hyperexpanded. No pneumothorax. IMPRESSION: Bibasilar atelectasis versus mild edema. Electronically Signed   By: Davina Poke D.O.   On: 10/13/2019 12:44     Medications: Scheduled Meds: . atorvastatin  40 mg Oral Daily  . calcium acetate  1,334 mg Oral TID with meals  . cinacalcet  30 mg Oral Q supper  . isosorbide mononitrate  15 mg Oral Daily  . metoprolol tartrate  25 mg Oral BID  . pantoprazole (PROTONIX) IV  40 mg Intravenous Q12H  . sodium chloride flush  3 mL Intravenous Once   Continuous Infusions: PRN Meds:.loperamide    Assessment/Plan: 59 y.o. female with chronic gastric ulcer  She has not had any overt GI bleeding since admission.  Her Hb is low but stable and she has not required blood transfusion this admission.  If she tolerates diet advance  She is very clear that she never takes any type of OTC pain meds (No ASA or NSAIDs or even tylenol).  There is serious doubt  about whether she was taking antiacid medicines following her March 2021 admission (EGD [facility] showed gastric ulcers path showed no sign of neoplasm or H. Pylori).  Would observe another 24 hours.  If overt, obvious rebleeding then she will likely be safe for d/c tomorrow. Should have repeat EGD biopsies back by then. If no neoplasm or H. Pylori on the repeat biopsies then it will probably be best to keep her on PPI BID for 2 months and then once daily indefinitely after that. She will also need repeat EGD in 2 months as outpatient to check for ulcer healing.   Milus Banister, MD  10/15/2019, 8:40 AM [facility] Pager [phone]

## 2019-10-16 DIAGNOSIS — Z992 Dependence on renal dialysis: Secondary | ICD-10-CM | POA: Diagnosis not present

## 2019-10-16 DIAGNOSIS — E875 Hyperkalemia: Secondary | ICD-10-CM | POA: Diagnosis not present

## 2019-10-16 DIAGNOSIS — D509 Iron deficiency anemia, unspecified: Secondary | ICD-10-CM | POA: Diagnosis not present

## 2019-10-16 DIAGNOSIS — N186 End stage renal disease: Secondary | ICD-10-CM | POA: Diagnosis not present

## 2019-10-16 DIAGNOSIS — N2581 Secondary hyperparathyroidism of renal origin: Secondary | ICD-10-CM | POA: Diagnosis not present

## 2019-10-16 DIAGNOSIS — D631 Anemia in chronic kidney disease: Secondary | ICD-10-CM | POA: Diagnosis not present

## 2019-10-16 LAB — CBC
HCT: 29.2 % — ABNORMAL LOW (ref 36.0–46.0)
Hemoglobin: 9.7 g/dL — ABNORMAL LOW (ref 12.0–15.0)
MCH: 28.5 pg (ref 26.0–34.0)
MCHC: 33.2 g/dL (ref 30.0–36.0)
MCV: 85.9 fL (ref 80.0–100.0)
Platelets: 193 10*3/uL (ref 150–400)
RBC: 3.4 MIL/uL — ABNORMAL LOW (ref 3.87–5.11)
RDW: 14.6 % (ref 11.5–15.5)
WBC: 6 10*3/uL (ref 4.0–10.5)
nRBC: 0 % (ref 0.0–0.2)

## 2019-10-16 LAB — SURGICAL PATHOLOGY

## 2019-10-16 LAB — GLUCOSE, CAPILLARY: Glucose-Capillary: 89 mg/dL (ref 70–99)

## 2019-10-16 LAB — BPAM RBC
Blood Product Expiration Date: 202106182359
ISSUE DATE / TIME: 202105171105
Unit Type and Rh: 5100

## 2019-10-16 LAB — TYPE AND SCREEN
ABO/RH(D): O POS
Antibody Screen: NEGATIVE
Unit division: 0

## 2019-10-16 MED ORDER — PANTOPRAZOLE SODIUM 40 MG PO TBEC: 40.0000 mg | DELAYED_RELEASE_TABLET | Freq: Two times a day (BID) | ORAL | 0 refills | Status: DC

## 2019-10-16 NOTE — TOC Transition Note (Signed)
Transition of Care [facility]) - CM/SW Discharge Note   Patient Details  Name: Karen Morrow MRN: [id number] Date of Birth: 1960/12/28  Transition of Care [facility]) CM/SW Contact:  Angelita Ingles, RN Phone Number: [phone]  10/16/2019, 9:53 AM   Clinical Narrative:    Patient is being discharged home with self care. Will continue current Hemodialysis schedule TU/TH/Sat. There are no TOC needs at this time. CM will sign off.   Final next level of care: Home/Self Care Barriers to Discharge: No Barriers Identified   Patient Goals and CMS Choice Patient states their goals for this hospitalization and ongoing recovery are:: Wants to go home tomorrow hopefully   Choice offered to / list presented to : NA  Discharge Placement                       Discharge Plan and Services In-house Referral: NA Discharge Planning Services: CM Consult Post Acute Care Choice: NA          DME Arranged: N/A DME Agency: NA       HH Arranged: NA HH Agency: NA        Social Determinants of Health (SDOH) Interventions     Readmission Risk Interventions Readmission Risk Prevention Plan 10/15/2019 10/07/2018 10/01/2018  Transportation Screening Complete Complete -  PCP or Specialist Appt within 3-5 Days Complete - -  [place] or [place] Patient refused - -  Social Work Consult for [place] Planning/Counseling Complete - -  Palliative Care Screening Not Applicable - -  Medication Review [employer] photographer) Referral to Pharmacy Complete -  PCP or Specialist appointment within 3-5 days of discharge - Not Complete -  PCP/Specialist Appt Not Complete comments - unable to schedule appointment on weekend -  [place] or [place] - Complete Complete  SW Recovery Care/Counseling Consult - Complete -  Palliative Care Screening - Not Applicable -  [place] - Not Applicable -  Some recent data might be hidden

## 2019-10-16 NOTE — Plan of Care (Signed)
Dc instructions given to pt at this time.  Pt verbalized understanding of all instructions.  No s/s of any acute distress.  No c/o pain.

## 2019-10-16 NOTE — Discharge Summary (Signed)
Physician Discharge Summary  Karen Morrow LHT:[id number] DOB: 02-Jan-1961 DOA: 10/13/2019  PCP: [name], MD  Admit date: 10/13/2019 Discharge date: 10/16/2019  Admitted From: Home Disposition: Home   Recommendations for Outpatient Follow-up:  1. Follow up with PCP in 1-2 weeks 2. Follow up with [facility]: Keep her on PPI BID for 2 months and then once daily indefinitely after that. She will also need repeat EGD in 2 months as outpatient to check for ulcer healing.  3. Please obtain BMP/CBC in one week 4. Recommend outpatient cardiology follow up for ischemic evaluation.  Home Health: None Equipment/Devices: None new Discharge Condition: Stable CODE STATUS: Full Diet recommendation: Renal  Brief/Interim Summary: Karen Morrow is a 59 y.o. female with a history of HFpEF, PAF not on anticoagulation due to GI bleeding, ESRD on HD TTS, hemorrhagic gastritis and gastric ulcer found on admission from 3/3-3/7 when she also required 2u PRBCs. She presented from hemodialysis 5/15 with substernal chest pain 10 minutes into treatment improved with NTG and resolved by time of ED arrival. During the day she had 2 dark stools and 2 dark episodes of emesis. Hgb 9.3g/dl (near baseline), troponin improved from prior at 43, no ST segment elevations on ECG. She was admitted with GI consultation and has had no further bleeding, though hgb dropped to 7.9 g/dl and 1u PRBCs given after discussion with GI and nephrology. EGD revealed nonbleeding gastric ulcer with pigmented material which was biopsied, congestive gastropathy and duodenitis. PPI was recommended and when the patient had no further bleeding and stable hgb and tolerated a diet, she was discharged in time for outpatient dialysis on 5/18.  Discharge Diagnoses:  Active Problems:   Chest pain   Hypertensive cardiomyopathy, without heart failure (HCC)   Acute upper GI bleeding   UGIB (upper gastrointestinal bleed)  Recurrent upper GI  bleeding, gastritis, gastric ulcer:  - Given 1u PRBCs 5/17. No bleeding noted during hospitalization. Hgb up appropriately and patient not symptomatically anemic.  - Biopsy results pending at discharge. GI to follow up. - PPI to continue BID x2 months, and indefinitely daily thereafter - Needs GI follow up with EGD in 2 months. - Continue to avoid ASA, NSAIDs  EGD by Dr. Loletha Carrow 10/14/2019: Impression:       - Benign-appearing esophageal stenosis.                           - Non-bleeding gastric ulcer with pigmented                            material. Biopsied.                           - Congestive gastropathy.                           - Duodenitis.  Chest pain: Resolved, troponin mildly elevated, less so than at previous hospitalization.  - Will need ischemic evaluation, but not urgently.  - Continue beta blocker, statin - No ASA with GI bleeding  ESRD: HD TTS.  - Not volume overloaded, uremic, or with severe metabolic derangements. Will DC in time to dialyze at [place] per routine.  HTN:  - Continue home medications and monitoring  HLD:  - Continue statin  Hypoalbuminemia:  - Supplement as able.   Discharge Instructions  Allergies as of 10/16/2019      Reactions   Cefazolin Other (See Comments)   Severe thrombocytopenia (has tolerated zosyn in the past) ID aware Tolerated in October 2019   Cephalosporins Anaphylaxis   Tuberculin Swelling      Medication List    TAKE these medications   acetaminophen 500 MG tablet Commonly known as: TYLENOL Take 500-1,000 mg by mouth every 6 (six) hours as needed for headache (pain).   albuterol 108 (90 Base) MCG/ACT inhaler Commonly known as: VENTOLIN HFA TAKE 2 PUFFS BY MOUTH EVERY 6 HOURS AS NEEDED FOR WHEEZE OR SHORTNESS OF BREATH What changed: See the new instructions.   b complex-vitamin c-folic acid 0.8 MG Tabs tablet Take 1 tablet by mouth every evening.   calcium acetate 667 MG capsule Commonly known as:  PHOSLO Take 1,334 mg by mouth 3 (three) times daily.   cinacalcet 30 MG tablet Commonly known as: SENSIPAR TAKE 1 TABLET BY MOUTH DAILY EVERY EVENING (DO NOT TAKE LESS THAN 12 HOURS PRIOR TO DIALYSIS)   lidocaine-prilocaine cream Commonly known as: EMLA Apply 1 application topically See admin instructions. 1 application before dialysis.   metoprolol tartrate 25 MG tablet Commonly known as: LOPRESSOR Take 0.5 tablets (12.5 mg total) by mouth 2 (two) times daily.   pantoprazole 40 MG tablet Commonly known as: PROTONIX Take 1 tablet (40 mg total) by mouth 2 (two) times daily. for 2 months, then once daily What changed: additional instructions      Follow-up Information    [name], MD. Schedule an appointment as soon as possible for a visit in 1 week(s).   Specialty: Internal Medicine Contact information: [address]  [place] [address] [phone]          Allergies  Allergen Reactions  . Cefazolin Other (See Comments)    Severe thrombocytopenia (has tolerated zosyn in the past) ID aware Tolerated in October 2019  . Cephalosporins Anaphylaxis  . Tuberculin Swelling    Consultations:  GI  Nephrology  Procedures/Studies: DG Chest 2 View  Result Date: 10/13/2019 CLINICAL DATA:  Chest pain EXAM: CHEST - 2 VIEW COMPARISON:  12/27/2018 FINDINGS: Previously seen hemodialysis catheter has been removed in the interval. Stable cardiomegaly. Mildly prominent bibasilar interstitial markings. Possible trace bilateral pleural effusions. Lungs are hyperexpanded. No pneumothorax. IMPRESSION: Bibasilar atelectasis versus mild edema. Electronically Signed   By: Davina Poke D.O.   On: 10/13/2019 12:44     Subjective: Feels well, fully dressed at 8am this morning ready for discharge. Denies any bleeding, bruising, lightheadedness, chest pain, dyspnea.  Discharge Exam: Vitals:   10/15/19 2317 10/16/19 0735  BP: 125/74 (!) 158/92  Pulse: 74 76  Resp:   16  Temp: (!) 97.5 F (36.4 C) 97.6 F (36.4 C)  SpO2: 100% 99%   General: Pt is alert, awake, not in acute distress Cardiovascular: RRR, S1/S2 +, no rubs, no gallops Respiratory: CTA bilaterally, no wheezing, no rhonchi Abdominal: Soft, NT, ND, bowel sounds + Extremities: No edema, no cyanosis  Labs: BNP (last 3 results) No results for input(s): BNP in the last 8760 hours. Basic Metabolic Panel: Recent Labs  Lab 10/13/19 1149 10/14/19 0114 10/15/19 0238  NA 141 139 138  K 4.8 4.3 4.7  CL 101 101 101  CO2 27 25 24   GLUCOSE 85 81 81  BUN 28* 37* 44*  CREATININE 4.20* 5.08* 6.34*  CALCIUM 8.9 8.7* 8.7*  PHOS  --   --  8.0*   Liver Function  Tests: Recent Labs  Lab 10/14/19 0114 10/15/19 0238  AST 24  --   ALT 17  --   ALKPHOS 78  --   BILITOT 0.8  --   PROT 5.5*  --   ALBUMIN 2.3* 2.3*   No results for input(s): LIPASE, AMYLASE in the last 168 hours. No results for input(s): AMMONIA in the last 168 hours. CBC: Recent Labs  Lab 10/13/19 1149 10/13/19 1149 10/13/19 1829 10/13/19 1829 10/14/19 0114 10/14/19 0114 10/14/19 0654 10/14/19 1055 10/14/19 1627 10/15/19 0238 10/16/19 0539  WBC 6.4  --  6.9  --  6.2  --  5.5  --   --   --  6.0  HGB 9.3*   < > 9.7*   < > 8.2*   < > 7.9* 8.3* 7.8* 7.6* 9.7*  HCT 28.7*   < > 29.7*   < > 24.6*   < > 23.6* 24.8* 24.1* 22.6* 29.2*  MCV 86.7  --  86.1  --  85.4  --  84.6  --   --   --  85.9  PLT 201  --  235  --  190  --  199  --   --   --  193   < > = values in this interval not displayed.   Cardiac Enzymes: No results for input(s): CKTOTAL, CKMB, CKMBINDEX, TROPONINI in the last 168 hours. BNP: Invalid input(s): POCBNP CBG: Recent Labs  Lab 10/15/19 1153 10/15/19 1649 10/15/19 1944 10/15/19 2318 10/16/19 0402  GLUCAP 107* 90 119* 89 89   D-Dimer No results for input(s): DDIMER in the last 72 hours. Hgb A1c No results for input(s): HGBA1C in the last 72 hours. Lipid Profile No results for input(s):  CHOL, HDL, LDLCALC, TRIG, CHOLHDL, LDLDIRECT in the last 72 hours. Thyroid function studies No results for input(s): TSH, T4TOTAL, T3FREE, THYROIDAB in the last 72 hours.  Invalid input(s): FREET3 Anemia work up No results for input(s): VITAMINB12, FOLATE, FERRITIN, TIBC, IRON, RETICCTPCT in the last 72 hours. Urinalysis    Component Value Date/Time   COLORURINE YELLOW 11/26/2013 1819   APPEARANCEUR TURBID (A) 11/26/2013 1819   LABSPEC 1.021 11/26/2013 1819   PHURINE 5.5 11/26/2013 1819   GLUCOSEU NEGATIVE 11/26/2013 1819   HGBUR SMALL (A) 11/26/2013 1819   BILIRUBINUR NEGATIVE 11/26/2013 1819   KETONESUR NEGATIVE 11/26/2013 1819   PROTEINUR >300 (A) 11/26/2013 1819   UROBILINOGEN 0.2 11/26/2013 1819   NITRITE POSITIVE (A) 11/26/2013 1819   LEUKOCYTESUR MODERATE (A) 11/26/2013 1819    Microbiology Recent Results (from the past 240 hour(s))  SARS Coronavirus 2 by RT PCR (hospital order, performed in [place] hospital lab) Nasopharyngeal Nasopharyngeal Swab     Status: None   Collection Time: 10/13/19  4:14 PM   Specimen: Nasopharyngeal Swab  Result Value Ref Range Status   SARS Coronavirus 2 NEGATIVE NEGATIVE Final    Comment: (NOTE) SARS-CoV-2 target nucleic acids are NOT DETECTED. The SARS-CoV-2 RNA is generally detectable in upper and lower respiratory specimens during the acute phase of infection. The lowest concentration of SARS-CoV-2 viral copies this assay can detect is 250 copies / mL. A negative result does not preclude SARS-CoV-2 infection and should not be used as the sole basis for treatment or other patient management decisions.  A negative result may occur with improper specimen collection / handling, submission of specimen other than nasopharyngeal swab, presence of viral mutation(s) within the areas targeted by this assay, and inadequate number of viral copies (<250  copies / mL). A negative result must be combined with clinical observations, patient  history, and epidemiological information. Fact Sheet for Patients:   StrictlyIdeas.no Fact Sheet for Healthcare Providers: BankingDealers.co.za This test is not yet approved or cleared  by the [place] FDA and has been authorized for detection and/or diagnosis of SARS-CoV-2 by FDA under an Emergency Use Authorization (EUA).  This EUA will remain in effect (meaning this test can be used) for the duration of the COVID-19 declaration under Section 564(b)(1) of the Act, 21 U.S.C. section 360bbb-3(b)(1), unless the authorization is terminated or revoked sooner. Performed at [facility], [place] [address], [place], [place] [address]     Time coordinating discharge: Approximately 40 minutes  Patrecia Pour, MD  [facility] 10/16/2019, 8:13 AM

## 2019-10-16 NOTE — Progress Notes (Signed)
Renal Navigator notes patient is being discharged this morning, her typical HD day and met with patient to ensure that she has a plan for HD. She states, "I'm ready to go home," and notes plans to make a quick stop at home and then on to [facility] for [facility] treatment this morning. [facility] know that patient is on the way there from the hospital for tx today. Patient's husband contacted and on the way to pick her up and take her to tx. Patient states no questions or needs from Navigator. Renal PA notified of patient's discharge to OP HD clinic and will send orders.  Alphonzo Cruise, [facility]al Navigator [phone]

## 2019-10-17 ENCOUNTER — Telehealth: Payer: Self-pay

## 2019-10-17 NOTE — Telephone Encounter (Addendum)
Transition Care Management Follow-up Telephone Call  Date of discharge and from where: 10/16/2019, [facility]   How have you been since you were released from the hospital? She stated she is " fine."   Any questions or concerns?  none reported  Items Reviewed:  Did the pt receive and understand the discharge instructions provided?she said she did not have them with her.  Medications obtained and verified? she said that she has to pick up the pantoprazole.  She does not have any other medications or her inhaler and is not planning on getting them.  She said that she has never taken the metoprolol.   Any new allergies since your discharge?  none reported   Do you have support at home?  yes, her husband   Other (ie: DME, [place], etc) no home health or DME ordered.  Has walker and cane but does not need them at this time  Attends HD T/T/S   Functional Questionnaire: (I = Independent and D = Dependent) ADL's: independent   Follow up appointments reviewed:    PCP Hospital f/u appt confirmed? scheduled appt with Dr Wynetta Emery 11/16/2019 @ 1430.   She said that the appt in June is fine.   [facility] f/u appt confirmed?  None scheduled at this time   Are transportation arrangements needed? her husband drives  If their condition worsens, is the pt aware to call  their PCP or go to the ED?yes  Was the patient provided with contact information for the PCP's office or ED? she has the clinic phone number  Was the pt encouraged to call back with questions or concerns?  yes

## 2019-10-18 ENCOUNTER — Encounter: Payer: Self-pay | Admitting: Gastroenterology

## 2019-10-18 DIAGNOSIS — D509 Iron deficiency anemia, unspecified: Secondary | ICD-10-CM | POA: Diagnosis not present

## 2019-10-18 DIAGNOSIS — E875 Hyperkalemia: Secondary | ICD-10-CM | POA: Diagnosis not present

## 2019-10-18 DIAGNOSIS — N2581 Secondary hyperparathyroidism of renal origin: Secondary | ICD-10-CM | POA: Diagnosis not present

## 2019-10-18 DIAGNOSIS — Z992 Dependence on renal dialysis: Secondary | ICD-10-CM | POA: Diagnosis not present

## 2019-10-18 DIAGNOSIS — N186 End stage renal disease: Secondary | ICD-10-CM | POA: Diagnosis not present

## 2019-10-18 DIAGNOSIS — D631 Anemia in chronic kidney disease: Secondary | ICD-10-CM | POA: Diagnosis not present

## 2019-10-20 DIAGNOSIS — E875 Hyperkalemia: Secondary | ICD-10-CM | POA: Diagnosis not present

## 2019-10-20 DIAGNOSIS — D631 Anemia in chronic kidney disease: Secondary | ICD-10-CM | POA: Diagnosis not present

## 2019-10-20 DIAGNOSIS — D509 Iron deficiency anemia, unspecified: Secondary | ICD-10-CM | POA: Diagnosis not present

## 2019-10-20 DIAGNOSIS — N2581 Secondary hyperparathyroidism of renal origin: Secondary | ICD-10-CM | POA: Diagnosis not present

## 2019-10-20 DIAGNOSIS — N186 End stage renal disease: Secondary | ICD-10-CM | POA: Diagnosis not present

## 2019-10-20 DIAGNOSIS — Z992 Dependence on renal dialysis: Secondary | ICD-10-CM | POA: Diagnosis not present

## 2019-10-23 DIAGNOSIS — Z992 Dependence on renal dialysis: Secondary | ICD-10-CM | POA: Diagnosis not present

## 2019-10-23 DIAGNOSIS — N2581 Secondary hyperparathyroidism of renal origin: Secondary | ICD-10-CM | POA: Diagnosis not present

## 2019-10-23 DIAGNOSIS — E875 Hyperkalemia: Secondary | ICD-10-CM | POA: Diagnosis not present

## 2019-10-23 DIAGNOSIS — D509 Iron deficiency anemia, unspecified: Secondary | ICD-10-CM | POA: Diagnosis not present

## 2019-10-23 DIAGNOSIS — N186 End stage renal disease: Secondary | ICD-10-CM | POA: Diagnosis not present

## 2019-10-23 DIAGNOSIS — D631 Anemia in chronic kidney disease: Secondary | ICD-10-CM | POA: Diagnosis not present

## 2019-10-25 DIAGNOSIS — N186 End stage renal disease: Secondary | ICD-10-CM | POA: Diagnosis not present

## 2019-10-25 DIAGNOSIS — D509 Iron deficiency anemia, unspecified: Secondary | ICD-10-CM | POA: Diagnosis not present

## 2019-10-25 DIAGNOSIS — D631 Anemia in chronic kidney disease: Secondary | ICD-10-CM | POA: Diagnosis not present

## 2019-10-25 DIAGNOSIS — Z992 Dependence on renal dialysis: Secondary | ICD-10-CM | POA: Diagnosis not present

## 2019-10-25 DIAGNOSIS — N2581 Secondary hyperparathyroidism of renal origin: Secondary | ICD-10-CM | POA: Diagnosis not present

## 2019-10-25 DIAGNOSIS — E875 Hyperkalemia: Secondary | ICD-10-CM | POA: Diagnosis not present

## 2019-10-27 DIAGNOSIS — N2581 Secondary hyperparathyroidism of renal origin: Secondary | ICD-10-CM | POA: Diagnosis not present

## 2019-10-27 DIAGNOSIS — Z992 Dependence on renal dialysis: Secondary | ICD-10-CM | POA: Diagnosis not present

## 2019-10-27 DIAGNOSIS — E875 Hyperkalemia: Secondary | ICD-10-CM | POA: Diagnosis not present

## 2019-10-27 DIAGNOSIS — D631 Anemia in chronic kidney disease: Secondary | ICD-10-CM | POA: Diagnosis not present

## 2019-10-27 DIAGNOSIS — N186 End stage renal disease: Secondary | ICD-10-CM | POA: Diagnosis not present

## 2019-10-27 DIAGNOSIS — D509 Iron deficiency anemia, unspecified: Secondary | ICD-10-CM | POA: Diagnosis not present

## 2019-10-31 IMAGING — MR MR LUMBAR SPINE W/O CM
4 of 5 series · 18 of 48 positions shown · non-contrast
Comparison: CT Abdomen and Pelvis 03/09/2018 and earlier.

CLINICAL DATA: 57-year-old female with weakness. Difficulty
walking. Fever of unknown origin, and questionable L5-S1 discitis on
CT Abdomen and Pelvis . In stage renal disease on dialysis.

EXAM:
MRI LUMBAR SPINE WITHOUT CONTRAST
TECHNIQUE: Multiplanar, multisequence MR imaging of the lumbar spine was
performed. No intravenous contrast was administered.

[Series 2: T2 · sagittal · 4.0mm · 0.55mm/px · 5 of 14 slices shown (1 of 2)]
[im 1/14]
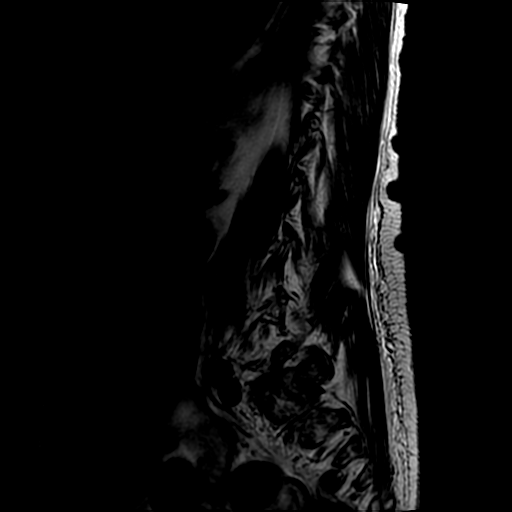
[im 4/14]
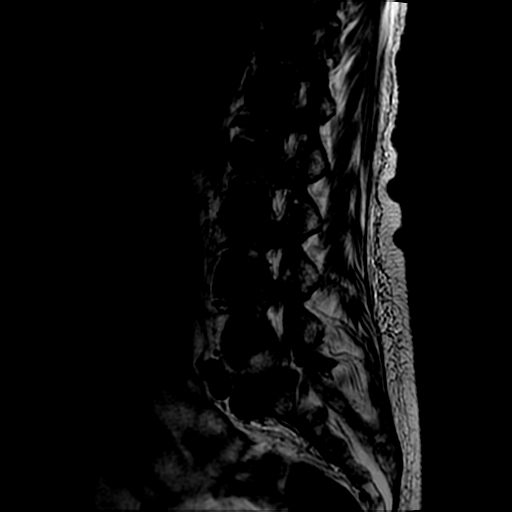
[im 7/14]
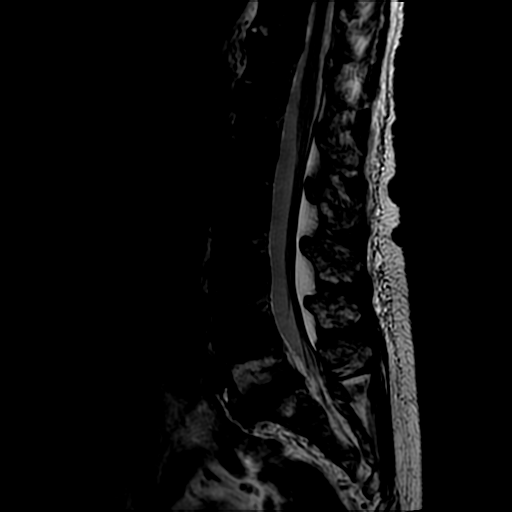
[im 10/14]
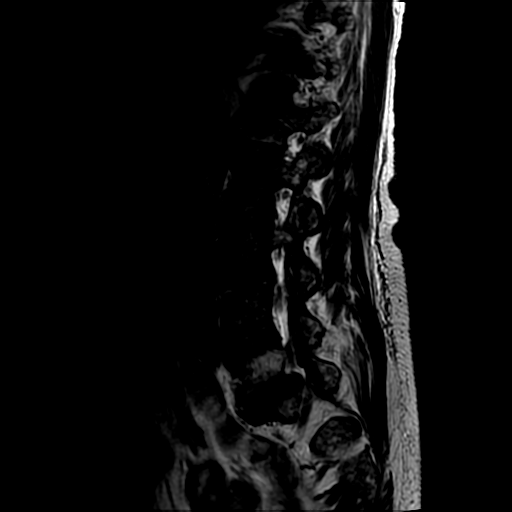
[im 14/14]
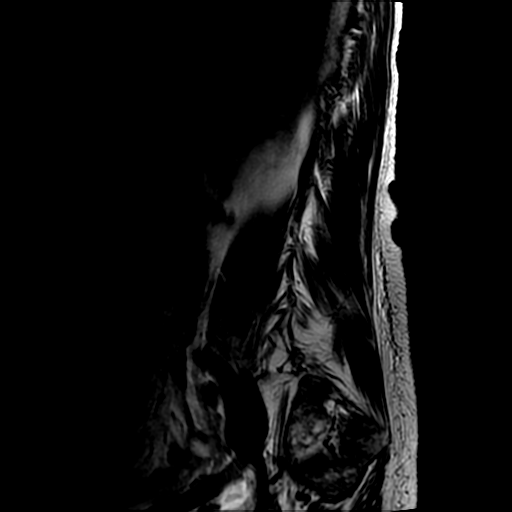

[Series 4: T1 · sagittal · 4.0mm · 0.55mm/px · 3 of 14 slices shown (1 of 2)]
[im 3/14]
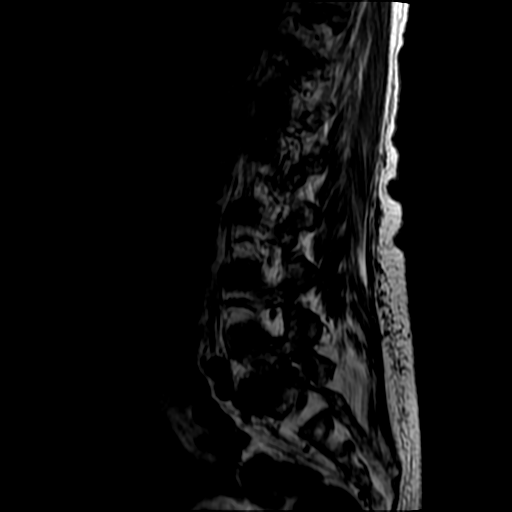
[im 8/14]
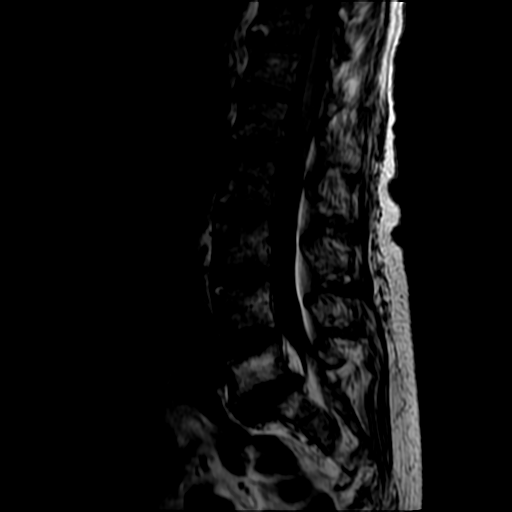
[im 14/14]
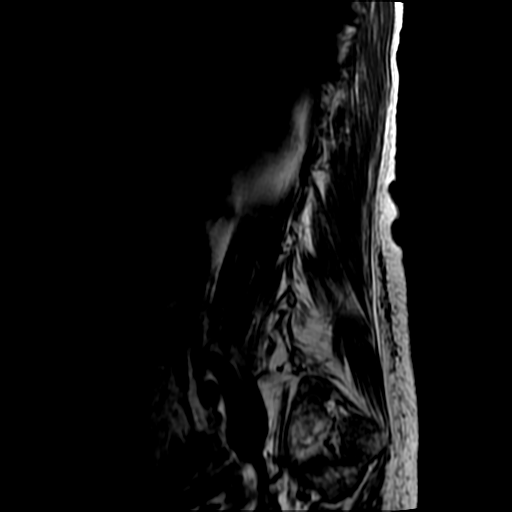

[Series 5: T2 · axial · 4.0mm · 0.39mm/px · z∈[-64,+90]mm · 7 of 40 slices shown (2 of 2)]
[im 3/40]
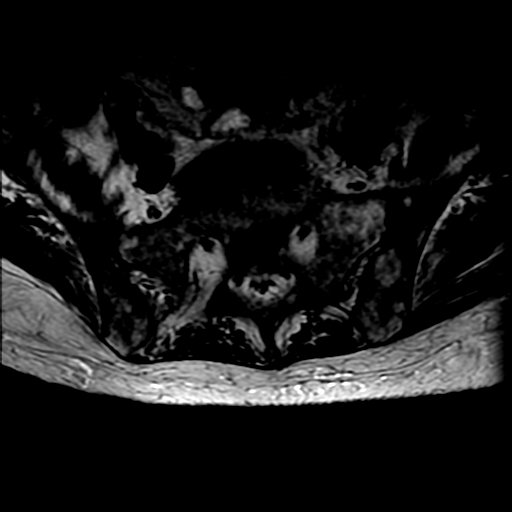
[im 6/40]
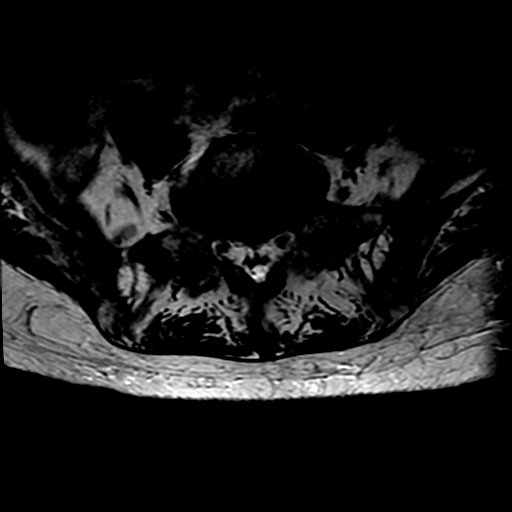
[im 8/40]
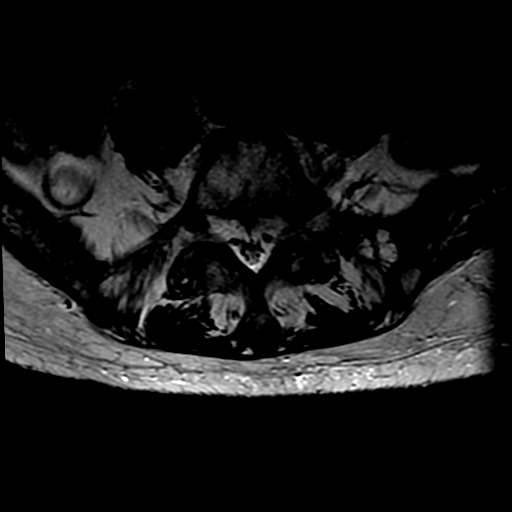
[im 14/40]
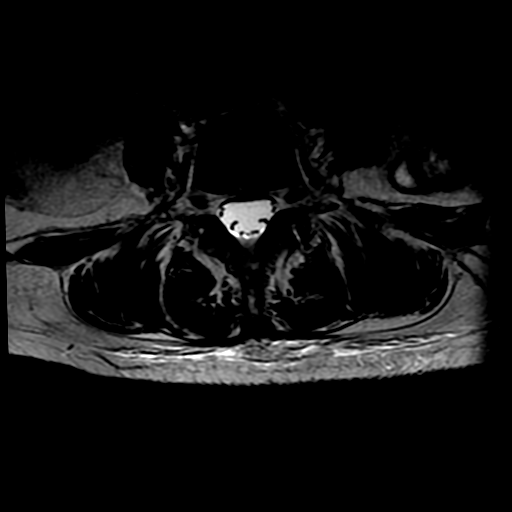
[im 19/40]
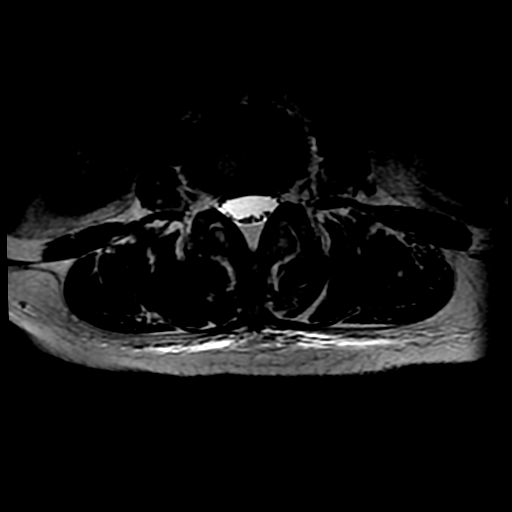
[im 21/40]
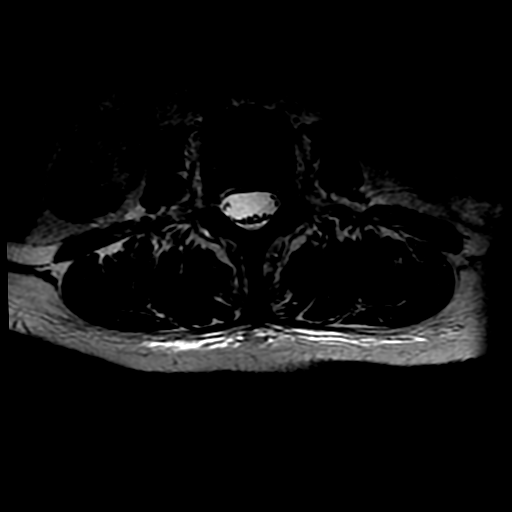
[im 34/40]
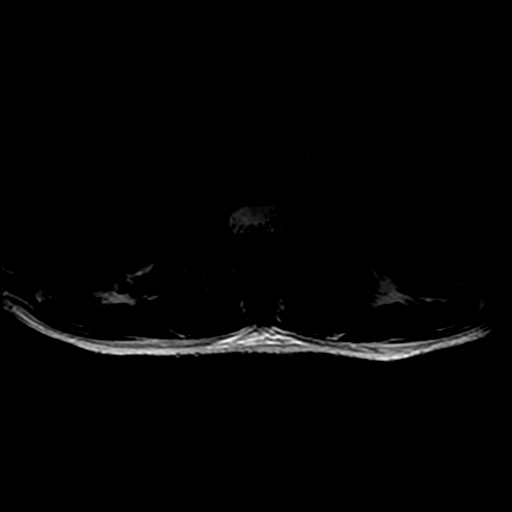

[Series 6: T1 · axial · 4.0mm · 0.39mm/px · z∈[-49,+90]mm · 3 of 40 slices shown (2 of 2)]
[im 6/40]
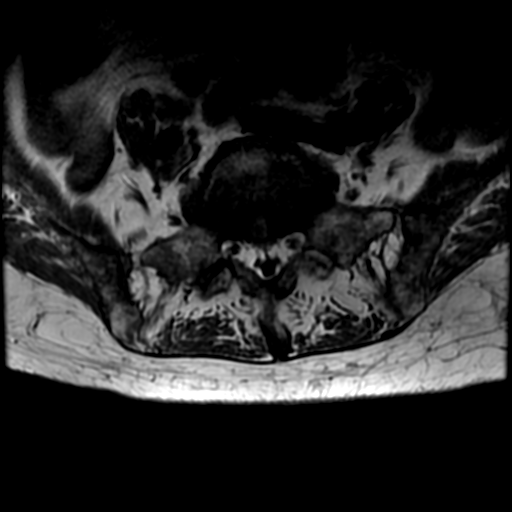
[im 21/40]
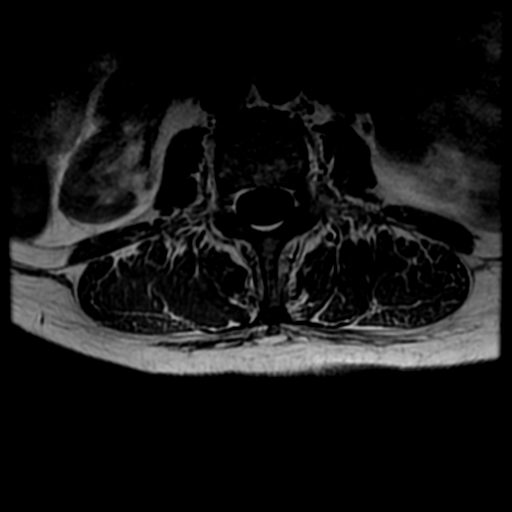
[im 34/40]
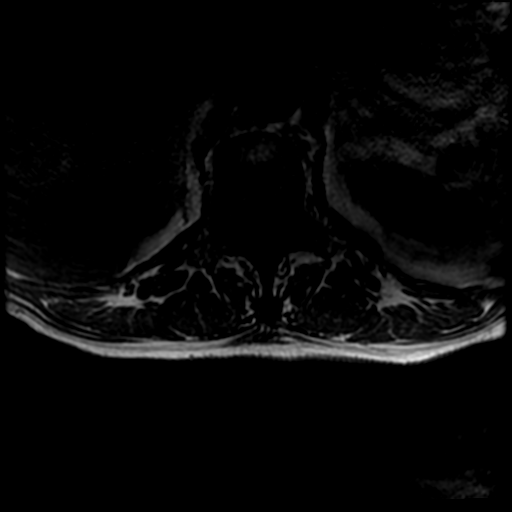

[18 of 48 positions shown; findings below may reference images not displayed]

FINDINGS: Segmentation:  Normal as seen on the CT comparison.

Alignment:  Stable since 3515 with preserved lumbar lordosis.

Vertebrae: Diffusely abnormal background bone marrow signal, a
especially in the lower thoracic and lumbar levels through L4. At L5
and in the visible sacrum, pelvis marrow signal is heterogeneous but
with better preserved T1 hyperintensity.

Still, there is no marrow edema or evidence of acute osseous
abnormality. Intact visible sacrum.

Conus medullaris and cauda equina: Conus extends to the L1 level. No
lower spinal cord or conus signal abnormality. Capacious spinal
canal.

Paraspinal and other soft tissues: Atrophied kidneys. Decreased T2
signal in the visible liver and spleen. Partially visible cystic
structure in the right hemipelvis, probably ovary related and simple
appearing.

There is a small focus of edema in the medial left psoas muscle at
the L4 level as seen on series 3, images 13 and 14. This correlates
to axial series 5, images 27-30 where the muscle appears to remain
largely normal. No fluid collection. There is no other paraspinal
inflammation.

Disc levels:

T11-T12: Disc desiccation with mild disc space loss, disc bulge and
anterior endplate spurring. No stenosis.

T12-L1 through L4-L5: Negative.

L5-S1: Chronic severe disc space loss here, vacuum disc in 3515
which was not apparent yesterday. Circumferential disc osteophyte
complex with broad-based posterior and biforaminal involvement.
Superimposed epidural lipomatosis. No edema within the epidural fat.
Moderate to severe bilateral L5 foraminal stenosis.
IMPRESSION: 1. Advanced chronic disc and endplate degeneration at L5-S1 but no
evidence of discitis osteomyelitis.
There is underlying generalized abnormal bone marrow signal favored
due to hemosiderosis and/or renal osteodystrophy.
2. There is a small area of nonspecific inflammation in the medial
left psoas muscle at L4, but there is no evidence of spinal
infection.
3. Capacious spinal canal with no degenerative spinal stenosis.
There is degenerative bilateral L5 neural foraminal stenosis.

## 2019-11-16 ENCOUNTER — Ambulatory Visit: Payer: Medicare Other | Attending: Internal Medicine | Admitting: Internal Medicine

## 2019-11-16 ENCOUNTER — Encounter: Payer: Self-pay | Admitting: Internal Medicine

## 2019-11-16 ENCOUNTER — Other Ambulatory Visit: Payer: Self-pay

## 2019-11-16 VITALS — BP 184/97 | HR 101 | Temp 97.7°F | Resp 16 | Ht 67.0 in | Wt 107.2 lb

## 2019-11-16 DIAGNOSIS — I480 Paroxysmal atrial fibrillation: Secondary | ICD-10-CM | POA: Diagnosis not present

## 2019-11-16 DIAGNOSIS — Z1211 Encounter for screening for malignant neoplasm of colon: Secondary | ICD-10-CM | POA: Diagnosis not present

## 2019-11-16 DIAGNOSIS — F17210 Nicotine dependence, cigarettes, uncomplicated: Secondary | ICD-10-CM | POA: Diagnosis not present

## 2019-11-16 DIAGNOSIS — Z992 Dependence on renal dialysis: Secondary | ICD-10-CM

## 2019-11-16 DIAGNOSIS — I10 Essential (primary) hypertension: Secondary | ICD-10-CM

## 2019-11-16 DIAGNOSIS — D696 Thrombocytopenia, unspecified: Secondary | ICD-10-CM | POA: Insufficient documentation

## 2019-11-16 DIAGNOSIS — M79606 Pain in leg, unspecified: Secondary | ICD-10-CM | POA: Insufficient documentation

## 2019-11-16 DIAGNOSIS — Z1231 Encounter for screening mammogram for malignant neoplasm of breast: Secondary | ICD-10-CM

## 2019-11-16 DIAGNOSIS — E440 Moderate protein-calorie malnutrition: Secondary | ICD-10-CM | POA: Diagnosis not present

## 2019-11-16 DIAGNOSIS — I132 Hypertensive heart and chronic kidney disease with heart failure and with stage 5 chronic kidney disease, or end stage renal disease: Secondary | ICD-10-CM | POA: Insufficient documentation

## 2019-11-16 DIAGNOSIS — Z681 Body mass index (BMI) 19 or less, adult: Secondary | ICD-10-CM | POA: Diagnosis not present

## 2019-11-16 DIAGNOSIS — Z09 Encounter for follow-up examination after completed treatment for conditions other than malignant neoplasm: Secondary | ICD-10-CM

## 2019-11-16 DIAGNOSIS — F17200 Nicotine dependence, unspecified, uncomplicated: Secondary | ICD-10-CM | POA: Diagnosis not present

## 2019-11-16 DIAGNOSIS — K449 Diaphragmatic hernia without obstruction or gangrene: Secondary | ICD-10-CM | POA: Insufficient documentation

## 2019-11-16 DIAGNOSIS — Z79899 Other long term (current) drug therapy: Secondary | ICD-10-CM | POA: Diagnosis not present

## 2019-11-16 DIAGNOSIS — I5032 Chronic diastolic (congestive) heart failure: Secondary | ICD-10-CM | POA: Diagnosis not present

## 2019-11-16 DIAGNOSIS — L299 Pruritus, unspecified: Secondary | ICD-10-CM

## 2019-11-16 DIAGNOSIS — N186 End stage renal disease: Secondary | ICD-10-CM | POA: Diagnosis not present

## 2019-11-16 DIAGNOSIS — K254 Chronic or unspecified gastric ulcer with hemorrhage: Secondary | ICD-10-CM

## 2019-11-16 DIAGNOSIS — R079 Chest pain, unspecified: Secondary | ICD-10-CM | POA: Diagnosis not present

## 2019-11-16 DIAGNOSIS — B182 Chronic viral hepatitis C: Secondary | ICD-10-CM

## 2019-11-16 MED ORDER — METOPROLOL TARTRATE 25 MG PO TABS: 12.5000 mg | ORAL_TABLET | Freq: Two times a day (BID) | ORAL | 3 refills | Status: DC

## 2019-11-16 MED ORDER — TRIAMCINOLONE ACETONIDE 0.1 % EX CREA: 1.0000 "application " | TOPICAL_CREAM | Freq: Two times a day (BID) | CUTANEOUS | 1 refills | Status: DC | PRN

## 2019-11-16 MED ORDER — CINACALCET HCL 30 MG PO TABS: ORAL_TABLET | ORAL | 0 refills | Status: DC

## 2019-11-16 MED ORDER — NEPHRO-VITE 0.8 MG PO TABS: 1.0000 | ORAL_TABLET | Freq: Every evening | ORAL | 3 refills | Status: DC

## 2019-11-16 MED ORDER — PANTOPRAZOLE SODIUM 40 MG PO TBEC: 40.0000 mg | DELAYED_RELEASE_TABLET | Freq: Two times a day (BID) | ORAL | 1 refills | Status: DC

## 2019-11-16 MED ORDER — NICOTINE 21 MG/24HR TD PT24: 21.0000 mg | MEDICATED_PATCH | Freq: Every day | TRANSDERMAL | 1 refills | Status: DC

## 2019-11-16 MED ORDER — CALCIUM ACETATE (PHOS BINDER) 667 MG PO CAPS: 1334.0000 mg | ORAL_CAPSULE | Freq: Three times a day (TID) | ORAL | 1 refills | Status: DC

## 2019-11-16 MED FILL — PANTOPRAZOLE SOD DR 40 MG T: 40 | 30 days supply | Qty: 60 | Fill #0

## 2019-11-16 MED FILL — METOPROLOL TARTRATE 25 MG T: 25 | 30 days supply | Qty: 30 | Fill #0

## 2019-11-16 MED FILL — TRIAMCINOLONE ACETONIDE 0.1: 0.1 | 15 days supply | Qty: 30 | Fill #0

## 2019-11-16 MED FILL — CALCIUM ACETATE 667 MG GELC: 667 | 15 days supply | Qty: 90 | Fill #0

## 2019-11-16 NOTE — Progress Notes (Signed)
Patient ID: Karen Morrow, female    DOB: Oct 02, 1960  MRN: [id number]  CC: Hospitalization Follow-up, Leg Pain, Fatigue, and Anorexia   Subjective: Karen Morrow is a 59 y.o. female who presents for hosp f/u and to establish with me as PCP.  Husband, Karen Morrow, is with her. Her concerns today include:  Patient with history of ESRD on HD, ACD, HTN, pulmonary HTN, diastolic CHF (EF 76-16% 0/7371), AI, hepatitis C, UGIB with gastric ulcers, PAF (3/21) not on anticoagulation due to history of GI bleed  Patient hospitalized 5/15-18/2021 with chest pain and recurrent GI bleed.  Troponin was found to be elevated but EKG revealed no ST segment elevations.  Hemoglobin on admission was 9.3 but later dropped to 7.9.  She was transfused 1 unit of packed red blood cells.  EGD revealed nonbleeding gastric ulcer with pigmented material which was biopsied, congestive gastropathy and duodenitis.  Patient started on Protonix twice a day.  Hemoglobin remained stable.  Discharged home on PPI and told to avoid taking aspirin or NSAIDs.  She needs follow-up with GI in 2 months for repeat EGD.  It was also recommended that she see cardiology in the outpatient setting for ischemia evaluation.  She was discharged on metoprolol.  Today: Patient tells me that she is not taking any medications  Gastric ulcers/anemia: Since discharge she has not been taking the pantoprazole.  Husband states the pharmacy told him the cost was $160.  Patient has Medicare and Medicaid.  Neither of them are sure whether her insurance covers her medications. -Not taking aspirin or NSAIDs. -Denies any further black stools.  No nausea or vomiting.  Reports poor appetite for the past 6 months with weight loss.  Husband tells me that she eats 4-6 bags of nonsalted popcorn a day.  She denies feeling depressed.  Negative HIV test done in March of this year.  Chest pains: No recent chest pains.  No lower extremity edema.  Sleeps on one pillow.   Orthopnea sometimes but not in the past week.  Discharge from the hospital on metoprolol but not taking it.  She was not aware that she was discharged with prescription for this medication or any medication for that matter  Tobacco dependence: Smokes about half a pack a day.  Smoked since the age of 89.  She has never quit in the past.  She would like to quit.  She is open to trying the nicotine patches.  Complains of itching of the right upper extremity which she states has been going on for a while.  She was unable to tell me how long it is "a while.".  History of hepatitis C.  She thinks she was treated in the past.  She reports having had vaccines for hepatitis B and A.  Denies any street drug use.  Used to use marijuana in the past.  Denies EtOH use  ESRD: On hemodialysis Tuesdays, Thursdays and Saturdays. Patient Active Problem List   Diagnosis Date Noted  . UGIB (upper gastrointestinal bleed) 10/13/2019  . Acute upper GI bleeding 08/01/2019  . Hypertensive cardiomyopathy, without heart failure ([place]) 03/07/2019  . Fluid overload 11/27/2018  . Acute on chronic diastolic CHF (congestive heart failure) ([place]) 11/27/2018  . Acute respiratory failure with hypoxia ([place]) 11/27/2018  . Leukocytosis 11/27/2018  . Volume overload 11/27/2018  . Hiatal hernia   . Gastrointestinal bleeding 09/29/2018  . GI bleed 09/29/2018  . Chronic hepatitis C ([place]) 09/23/2018  . Symptomatic anemia 09/16/2018  .  Severe systemic inflammatory response syndrome (SIRS) ([place]) 09/16/2018  . Altered mental status 09/16/2018  . Tobacco abuse 09/16/2018  . Chronic diastolic (congestive) heart failure ([place]) 07/07/2018  . Aortic insufficiency 07/07/2018  . Hypertensive urgency 05/13/2018  . Infection of arteriovenous fistula ([place]) 04/25/2018  . MSSA bacteremia 04/25/2018  . Bacteremia   . Staphylococcal sepsis ([place])   . Sepsis ([place]) 03/10/2018  . Hyperkalemia   . Acute respiratory distress   . Pulmonary  hypertension due to left ventricular diastolic dysfunction ([place]) 11/08/2016  . Chronic cough 10/15/2016  . Acute GI bleeding 09/07/2016  . Gastroesophageal reflux disease 04/16/2016  . Anemia in chronic kidney disease, on chronic dialysis ([place]) 04/16/2016  . Primary insomnia 04/16/2016  . Shortness of breath 04/16/2016  . Pruritus 12/11/2014  . Hordeolum externum (stye) 12/11/2014  . Xanthelasma of right upper eyelid 12/11/2014  . Dermatitis 10/30/2014  . ESRD on dialysis ([place]) 02/25/2014  . Malnutrition of moderate degree ([place]) 11/28/2013  . HTN (hypertension) 11/26/2013  . Gastric ulcer 06/07/2013  . Abdominal pain 11/03/2012  . Chest pain 11/03/2012  . Thrombocytopenia ([place]) 11/03/2012  . Elevated transaminase level 11/03/2012  . Hepatic cirrhosis ([place]) 11/03/2012  . Hepatitis C antibody test positive      Current Outpatient Medications on File Prior to Visit  Medication Sig Dispense Refill  . acetaminophen (TYLENOL) 500 MG tablet Take 500-1,000 mg by mouth every 6 (six) hours as needed for headache (pain).    Marland Kitchen albuterol (PROVENTIL HFA;VENTOLIN HFA) 108 (90 Base) MCG/ACT inhaler TAKE 2 PUFFS BY MOUTH EVERY 6 HOURS AS NEEDED FOR WHEEZE OR SHORTNESS OF BREATH (Patient taking differently: Inhale 2 puffs into the lungs every 6 (six) hours as needed for wheezing or shortness of breath. ) 18 Inhaler 0  . lidocaine-prilocaine (EMLA) cream Apply 1 application topically See admin instructions. 1 application before dialysis.     No current facility-administered medications on file prior to visit.    Allergies  Allergen Reactions  . Cefazolin Other (See Comments)    Severe thrombocytopenia (has tolerated zosyn in the past) ID aware Tolerated in October 2019  . Cephalosporins Anaphylaxis  . Tuberculin Swelling    Social History   Socioeconomic History  . Marital status: Married    Spouse name: Not on file  . Number of children: Not on file  . Years of education: Not on file  .  Highest education level: Not on file  Occupational History  . Occupation: disabled  Tobacco Use  . Smoking status: Current Every Day Smoker    Packs/day: 1.00    Years: 40.00    Pack years: 40.00    Types: Cigarettes  . Smokeless tobacco: Never Used  . Tobacco comment: now down to 1/2 ppd  Vaping Use  . Vaping Use: Never used  Substance and Sexual Activity  . Alcohol use: Not Currently    Alcohol/week: 0.0 standard drinks    Comment: never a heavy drinker, hasn't drank in years  . Drug use: Not Currently    Types: Marijuana    Comment: used to smoke once a day, remote h/o MJ  . Sexual activity: Not Currently  Other Topics Concern  . Not on file  Social History Narrative   Uses [employer] or her husband provides transportation- patient does not drive   Social Determinants of Radio broadcast assistant Strain:   . Difficulty of Paying Living Expenses:   Food Insecurity:   . Worried About Charity fundraiser in the  Last Year:   . [place] in the Last Year:   Transportation Needs:   . Film/video editor (Medical):   Marland Kitchen Lack of Transportation (Non-Medical):   Physical Activity:   . Days of Exercise per Week:   . Minutes of Exercise per Session:   Stress:   . Feeling of Stress :   Social Connections:   . Frequency of Communication with Friends and Family:   . Frequency of Social Gatherings with Friends and Family:   . Attends Religious Services:   . Active Member of Clubs or Organizations:   . Attends Archivist Meetings:   Marland Kitchen Marital Status:   Intimate Partner Violence:   . Fear of Current or Ex-Partner:   . Emotionally Abused:   Marland Kitchen Physically Abused:   . Sexually Abused:     Family History  Problem Relation Age of Onset  . Lupus Mother   . Diabetes Father   . Heart attack Father 75    Past Surgical History:  Procedure Laterality Date  . ABDOMINAL HYSTERECTOMY     "partial" (11/03/2012)  . AV FISTULA PLACEMENT Right 12/03/2013   Procedure:  CREATION OF ARTERIOVENOUS (AV) FISTULA RIGHT ARM ;  Surgeon: Rosetta Posner, MD;  Location: [place];  Service: Vascular;  Laterality: Right;  . AV FISTULA PLACEMENT Left 05/17/2018   Procedure: CREATION OF BRACHIOCEPHALIC FISTULA LEFT ARM;  Surgeon: Waynetta Sandy, MD;  Location: [place];  Service: Vascular;  Laterality: Left;  . [place] REMOVAL Right 03/16/2018   Procedure: REMOVAL OF ARTERIOVENOUS GORETEX GRAFT ([place]) RIGHT ARM;  Surgeon: Waynetta Sandy, MD;  Location: [place];  Service: Vascular;  Laterality: Right;  . BASCILIC VEIN TRANSPOSITION Right 03/06/2014   Procedure: SECOND STAGE BASILIC VEIN TRANSPOSITION;  Surgeon: Rosetta Posner, MD;  Location: [place];  Service: Vascular;  Laterality: Right;  . BIOPSY  09/30/2018   Procedure: BIOPSY;  Surgeon: Mauri Pole, MD;  Location[facility] ENDOSCOPY;  Service: Endoscopy;;  . BIOPSY  08/01/2019   Procedure: BIOPSY;  Surgeon: Ronald Lobo, MD;  Location: [facility] ENDOSCOPY;  Service: Endoscopy;;  . BIOPSY  10/14/2019   Procedure: BIOPSY;  Surgeon: Doran Stabler, MD;  Location[facility] ENDOSCOPY;  Service: Gastroenterology;;  . ENTEROSCOPY N/A 10/06/2018   Procedure: ENTEROSCOPY;  Surgeon: Lavena Bullion, DO;  Location: [place];  Service: Gastroenterology;  Laterality: N/A;  . ESOPHAGOGASTRODUODENOSCOPY N/A 06/05/2013   Procedure: ESOPHAGOGASTRODUODENOSCOPY (EGD);  Surgeon: Winfield Cunas., MD;  Location: [facility] ENDOS[facility];  Service: Endoscopy;  Laterality: N/A;  . ESOPHAGOGASTRODUODENOSCOPY N/A 09/08/2016   Procedure: ESOPHAGOGASTRODUODENOSCOPY (EGD);  Surgeon: Milus Banister, MD;  Location: [place];  Service: Endoscopy;  Laterality: N/A;  . ESOPHAGOGASTRODUODENOSCOPY (EGD) WITH PROPOFOL N/A 09/17/2018   Procedure: ESOPHAGOGASTRODUODENOSCOPY (EGD) WITH PROPOFOL;  Surgeon: Carol Ada, MD;  Location: [place];  Service: Endoscopy;  Laterality: N/A;  . ESOPHAGOGASTRODUODENOSCOPY (EGD) WITH PROPOFOL N/A 09/30/2018   Procedure:  ESOPHAGOGASTRODUODENOSCOPY (EGD) WITH PROPOFOL;  Surgeon: Mauri Pole, MD;  Location: [place] ENDOSCOPY;  Service: Endoscopy;  Laterality: N/A;  . ESOPHAGOGASTRODUODENOSCOPY (EGD) WITH PROPOFOL N/A 08/01/2019   Procedure: ESOPHAGOGASTRODUODENOSCOPY (EGD) WITH PROPOFOL;  Surgeon: Ronald Lobo, MD;  Location: [place];  Service: Endoscopy;  Laterality: N/A;  . ESOPHAGOGASTRODUODENOSCOPY (EGD) WITH PROPOFOL N/A 10/14/2019   Procedure: ESOPHAGOGASTRODUODENOSCOPY (EGD) WITH PROPOFOL;  Surgeon: Doran Stabler, MD;  Location: [place];  Service: Gastroenterology;  Laterality: N/A;  . INSERTION OF DIALYSIS CATHETER Left 03/16/2018   Procedure:  PLACEMENT OF TUNNEL DIALYSIS CATHETER;  Surgeon: Waynetta Sandy, MD;  Location: [place];  Service: Vascular;  Laterality: Left;  . INSERTION OF DIALYSIS CATHETER Right 09/26/2018   Procedure: INSERTION OF DIALYSIS  TUNNELED CATHETER;  Surgeon: Elam Dutch, MD;  Location[facility] OR;  Service: Vascular;  Laterality: Right;  . IR REMOVAL TUN CV CATH W/O FL  09/17/2018  . REVISON OF ARTERIOVENOUS FISTULA Right 01/28/2017   Procedure: REVISON OF RIGHT ARM ARTERIOVENOUS FISTULA USING 8MMX10CM GORETEX GRAFT;  Surgeon: Angelia Mould, MD;  Location: [facility] OR;  Service: Vascular;  Laterality: Right;    ROS: Review of Systems Negative except as stated above  PHYSICAL EXAM: BP (!) 184/97   Pulse (!) 101   Temp 97.7 F (36.5 C)   Resp 16   Ht 5\' 7"  (1.702 m)   Wt 107 lb 3.2 oz (48.6 kg)   SpO2 100%   BMI 16.79 kg/m    Physical Exam  General appearance -African-American female who appears chronically ill and older than stated age Mental status - normal mood, behavior, speech, dress, motor activity, and thought processes Eyes - pupils equal and reactive, extraocular eye movements intact Mouth - mucous membranes moist, pharynx normal without lesions Neck - supple, no significant adenopathy Chest - clear to auscultation, no wheezes,  rales or rhonchi, symmetric air entry Heart -harsh systolic ejection murmur heard along the left sternal border  abdomen - soft, nontender, nondistended, no masses or organomegaly Extremities -trace lower extremity edema.  She has dialysis grafts in both upper extremities.  Hmmm felt on the graft in the left upper extremity  CMP Latest Ref Rng & Units 10/15/2019 10/14/2019 10/13/2019  Glucose 70 - 99 mg/dL 81 81 85  BUN 6 - 20 mg/dL 44(H) 37(H) 28(H)  Creatinine 0.44 - 1.00 mg/dL 6.34(H) 5.08(H) 4.20(H)  Sodium 135 - 145 mmol/L 138 139 141  Potassium 3.5 - 5.1 mmol/L 4.7 4.3 4.8  Chloride 98 - 111 mmol/L 101 101 101  CO2 22 - 32 mmol/L 24 25 27   Calcium 8.9 - 10.3 mg/dL 8.7(L) 8.7(L) 8.9  Total Protein 6.5 - 8.1 g/dL - 5.5(L) -  Total Bilirubin 0.3 - 1.2 mg/dL - 0.8 -  Alkaline Phos 38 - 126 U/L - 78 -  AST 15 - 41 U/L - 24 -  ALT 0 - 44 U/L - 17 -   Lipid Panel     Component Value Date/Time   CHOL 120 06/17/2017 1617   TRIG 91 06/17/2017 1617   HDL 50 06/17/2017 1617   CHOLHDL 2.4 06/17/2017 1617   CHOLHDL 2.2 12/11/2014 1004   VLDL 24 12/11/2014 1004   LDLCALC 52 06/17/2017 1617    CBC    Component Value Date/Time   WBC 6.0 10/16/2019 0539   RBC 3.40 (L) 10/16/2019 0539   HGB 9.7 (L) 10/16/2019 0539   HGB 11.0 (L) 10/15/2016 1211   HCT 29.2 (L) 10/16/2019 0539   HCT 32.1 (L) 10/15/2016 1211   PLT 193 10/16/2019 0539   PLT 179 10/15/2016 1211   MCV 85.9 10/16/2019 0539   MCV 92 10/15/2016 1211   MCH 28.5 10/16/2019 0539   MCHC 33.2 10/16/2019 0539   RDW 14.6 10/16/2019 0539   RDW 15.6 (H) 10/15/2016 1211   LYMPHSABS 1.2 08/01/2019 0655   MONOABS 0.6 08/01/2019 0655   EOSABS 0.0 08/01/2019 0655   BASOSABS 0.0 08/01/2019 0655    ASSESSMENT AND PLAN:  1. Hospital discharge follow-up   2. Chronic  gastric ulcer with hemorrhage -Advised patient of the importance of getting the PPI and taking it as prescribed so that the ulcers in the stomach can heal.  We have  requested that prescriptions be sent to her pharmacy.  I will have our case worker follow-up with them next week to see if she can figure out whether her Medicare or Medicaid covers her medications which I think Medicaid would.  Advised not to take aspirin or NSAIDs. - pantoprazole (PROTONIX) 40 MG tablet; Take 1 tablet (40 mg total) by mouth 2 (two) times daily before a meal. for 2 months, then once daily  Dispense: 60 tablet; Refill: 1 - Ambulatory referral to Gastroenterology - CBC  3. Encounter for screening mammogram for malignant neoplasm of breast - MM Digital Screening; Future  4. Screening for colon cancer - Ambulatory referral to Gastroenterology  5. Malnutrition of moderate degree (HCC) -Advised eating smaller more frequent meals and supplementing meals with boost or Ensure - Ambulatory referral to Gastroenterology  6. Chest pain in adult Resolved - Ambulatory referral to Cardiology  7. Tobacco dependence Discussed health risks associated with smoking.  Advised to quit.  Patient wanting to give a trial of quitting.  She is wanting to try the nicotine patches.  I went over with her how to use the patches.  We will start with the 21 mg patches.  Less than 5 minutes spent on counseling. - nicotine (NICODERM CQ - DOSED IN MG/24 HOURS) 21 mg/24hr patch; Place 1 patch (21 mg total) onto the skin daily.  Dispense: 28 patch; Refill: 1  8. Essential hypertension Not at goal.  Prescription sent to the pharmacy for the metoprolol - metoprolol tartrate (LOPRESSOR) 25 MG tablet; Take 0.5 tablets (12.5 mg total) by mouth 2 (two) times daily.  Dispense: 30 tablet; Refill: 3  9. Itching - triamcinolone cream (KENALOG) 0.1 %; Apply 1 application topically 2 (two) times daily as needed. To right arm for itching  Dispense: 30 g; Refill: 1  10. Hep C w/o coma, chronic (HCC) We will check RNA quant to see whether she was treated and if so if she has SVR - Hepatitis c vrs RNA detect by PCR-qual  - Hepatitis B surface antibody,quantitative - Hepatitis A Antibody, IGM  11. ESRD (end stage renal disease) on dialysis ([place]) Sensipar, PhosLo and Nephro-Vite while on discharge medication list from the hospital.  I sent prescriptions to her pharmacy for all 3 of them.  However I advised her that she take her med list with her to her next dialysis session and find out from the nephrologist whether she still needs to be on these medicines. - cinacalcet (SENSIPAR) 30 MG tablet; TAKE 1 TABLET BY MOUTH DAILY EVERY EVENING (DO NOT TAKE LESS THAN 12 HOURS PRIOR TO DIALYSIS)  Dispense: 60 tablet; Refill: 0 - b complex-vitamin c-folic acid (NEPHRO-VITE) 0.8 MG TABS tablet; Take 1 tablet by mouth every evening.  Dispense: 30 tablet; Refill: 3 - calcium acetate (PHOSLO) 667 MG capsule; Take 2 capsules (1,334 mg total) by mouth 3 (three) times daily.  Dispense: 90 capsule; Refill: 1 - Basic metabolic panel  Patient was given the opportunity to ask questions.  Patient verbalized understanding of the plan and was able to repeat key elements of the plan.   Orders Placed This Encounter  Procedures  . MM Digital Screening  . CBC  . Basic metabolic panel  . Hepatitis c vrs RNA detect by PCR-qual  . Hepatitis B surface antibody,quantitative  .  Hepatitis A Antibody, IGM  . Ambulatory referral to Gastroenterology  . Ambulatory referral to Cardiology     Requested Prescriptions   Signed Prescriptions Disp Refills  . pantoprazole (PROTONIX) 40 MG tablet 60 tablet 1    Sig: Take 1 tablet (40 mg total) by mouth 2 (two) times daily before a meal. for 2 months, then once daily  . cinacalcet (SENSIPAR) 30 MG tablet 60 tablet 0    Sig: TAKE 1 TABLET BY MOUTH DAILY EVERY EVENING (DO NOT TAKE LESS THAN 12 HOURS PRIOR TO DIALYSIS)  . b complex-vitamin c-folic acid (NEPHRO-VITE) 0.8 MG TABS tablet 30 tablet 3    Sig: Take 1 tablet by mouth every evening.  . calcium acetate (PHOSLO) 667 MG capsule 90 capsule 1     Sig: Take 2 capsules (1,334 mg total) by mouth 3 (three) times daily.  . metoprolol tartrate (LOPRESSOR) 25 MG tablet 30 tablet 3    Sig: Take 0.5 tablets (12.5 mg total) by mouth 2 (two) times daily.  Marland Kitchen triamcinolone cream (KENALOG) 0.1 % 30 g 1    Sig: Apply 1 application topically 2 (two) times daily as needed. To right arm for itching  . nicotine (NICODERM CQ - DOSED IN MG/24 HOURS) 21 mg/24hr patch 28 patch 1    Sig: Place 1 patch (21 mg total) onto the skin daily.    Return in about 2 months (around 01/16/2020).  Karle Plumber, MD, FACP

## 2019-11-16 NOTE — Patient Instructions (Signed)
I have submitted a referral for you to see the gastroenterologist in follow-up for the stomach ulcers.  You should take the pantoprazole twice a day.  This will help the ulcers to heal.  I have referred you to the cardiologist for follow-up on the chest pains.   You should purchase and drink Ensure or boost to supplement your meals. Please take your medicine list with you tomorrow to dialysis and inquire whether the kidney doctor wants you to continue taking cinacalcet, calcium acetate and Nepro vitamin.  I have sent those prescriptions to our pharmacy but you should wait until after you have confirmed with the kidney specialist whether they still want you to be on these medicines.

## 2019-11-19 ENCOUNTER — Telehealth: Payer: Self-pay | Admitting: Internal Medicine

## 2019-11-19 NOTE — Telephone Encounter (Signed)
Called patient 11/19/19 to schedule initial visit from incoming referral received. The patient didn't answer so I left message for patient to return call to get appt scheduled

## 2019-11-28 ENCOUNTER — Encounter: Payer: Self-pay | Admitting: General Practice

## 2019-11-29 DIAGNOSIS — E875 Hyperkalemia: Secondary | ICD-10-CM | POA: Diagnosis not present

## 2019-11-29 DIAGNOSIS — N2581 Secondary hyperparathyroidism of renal origin: Secondary | ICD-10-CM | POA: Diagnosis not present

## 2019-11-29 DIAGNOSIS — Z992 Dependence on renal dialysis: Secondary | ICD-10-CM | POA: Diagnosis not present

## 2019-11-29 DIAGNOSIS — N186 End stage renal disease: Secondary | ICD-10-CM | POA: Diagnosis not present

## 2019-11-29 DIAGNOSIS — D509 Iron deficiency anemia, unspecified: Secondary | ICD-10-CM | POA: Diagnosis not present

## 2019-11-29 DIAGNOSIS — I120 Hypertensive chronic kidney disease with stage 5 chronic kidney disease or end stage renal disease: Secondary | ICD-10-CM | POA: Diagnosis not present

## 2019-11-29 DIAGNOSIS — D631 Anemia in chronic kidney disease: Secondary | ICD-10-CM | POA: Diagnosis not present

## 2019-11-29 DIAGNOSIS — K7460 Unspecified cirrhosis of liver: Secondary | ICD-10-CM | POA: Diagnosis not present

## 2019-12-04 DIAGNOSIS — Z992 Dependence on renal dialysis: Secondary | ICD-10-CM | POA: Diagnosis not present

## 2019-12-04 DIAGNOSIS — E875 Hyperkalemia: Secondary | ICD-10-CM | POA: Diagnosis not present

## 2019-12-04 DIAGNOSIS — N2581 Secondary hyperparathyroidism of renal origin: Secondary | ICD-10-CM | POA: Diagnosis not present

## 2019-12-04 DIAGNOSIS — D509 Iron deficiency anemia, unspecified: Secondary | ICD-10-CM | POA: Diagnosis not present

## 2019-12-04 DIAGNOSIS — D631 Anemia in chronic kidney disease: Secondary | ICD-10-CM | POA: Diagnosis not present

## 2019-12-04 DIAGNOSIS — N186 End stage renal disease: Secondary | ICD-10-CM | POA: Diagnosis not present

## 2019-12-08 DIAGNOSIS — Z992 Dependence on renal dialysis: Secondary | ICD-10-CM | POA: Diagnosis not present

## 2019-12-08 DIAGNOSIS — D631 Anemia in chronic kidney disease: Secondary | ICD-10-CM | POA: Diagnosis not present

## 2019-12-08 DIAGNOSIS — N2581 Secondary hyperparathyroidism of renal origin: Secondary | ICD-10-CM | POA: Diagnosis not present

## 2019-12-08 DIAGNOSIS — D509 Iron deficiency anemia, unspecified: Secondary | ICD-10-CM | POA: Diagnosis not present

## 2019-12-08 DIAGNOSIS — N186 End stage renal disease: Secondary | ICD-10-CM | POA: Diagnosis not present

## 2019-12-08 DIAGNOSIS — E875 Hyperkalemia: Secondary | ICD-10-CM | POA: Diagnosis not present

## 2019-12-11 DIAGNOSIS — Z992 Dependence on renal dialysis: Secondary | ICD-10-CM | POA: Diagnosis not present

## 2019-12-11 DIAGNOSIS — D631 Anemia in chronic kidney disease: Secondary | ICD-10-CM | POA: Diagnosis not present

## 2019-12-11 DIAGNOSIS — E875 Hyperkalemia: Secondary | ICD-10-CM | POA: Diagnosis not present

## 2019-12-11 DIAGNOSIS — N2581 Secondary hyperparathyroidism of renal origin: Secondary | ICD-10-CM | POA: Diagnosis not present

## 2019-12-11 DIAGNOSIS — N186 End stage renal disease: Secondary | ICD-10-CM | POA: Diagnosis not present

## 2019-12-11 DIAGNOSIS — D509 Iron deficiency anemia, unspecified: Secondary | ICD-10-CM | POA: Diagnosis not present

## 2019-12-13 ENCOUNTER — Telehealth: Payer: Self-pay

## 2019-12-13 DIAGNOSIS — N2581 Secondary hyperparathyroidism of renal origin: Secondary | ICD-10-CM | POA: Diagnosis not present

## 2019-12-13 DIAGNOSIS — D509 Iron deficiency anemia, unspecified: Secondary | ICD-10-CM | POA: Diagnosis not present

## 2019-12-13 DIAGNOSIS — E875 Hyperkalemia: Secondary | ICD-10-CM | POA: Diagnosis not present

## 2019-12-13 DIAGNOSIS — Z992 Dependence on renal dialysis: Secondary | ICD-10-CM | POA: Diagnosis not present

## 2019-12-13 DIAGNOSIS — D631 Anemia in chronic kidney disease: Secondary | ICD-10-CM | POA: Diagnosis not present

## 2019-12-13 DIAGNOSIS — N186 End stage renal disease: Secondary | ICD-10-CM | POA: Diagnosis not present

## 2019-12-13 NOTE — Telephone Encounter (Signed)
At request of Dr Wynetta Emery, attempted to contact the patient regarding medication costs. Call placed to # [phone], only fast busy, unable to leave a message. Call placed to # [phone], her husband answered and said that he would give her the message to return the call to this CM

## 2019-12-18 DIAGNOSIS — D509 Iron deficiency anemia, unspecified: Secondary | ICD-10-CM | POA: Diagnosis not present

## 2019-12-18 DIAGNOSIS — N2581 Secondary hyperparathyroidism of renal origin: Secondary | ICD-10-CM | POA: Diagnosis not present

## 2019-12-18 DIAGNOSIS — E875 Hyperkalemia: Secondary | ICD-10-CM | POA: Diagnosis not present

## 2019-12-18 DIAGNOSIS — N186 End stage renal disease: Secondary | ICD-10-CM | POA: Diagnosis not present

## 2019-12-18 DIAGNOSIS — Z992 Dependence on renal dialysis: Secondary | ICD-10-CM | POA: Diagnosis not present

## 2019-12-18 DIAGNOSIS — D631 Anemia in chronic kidney disease: Secondary | ICD-10-CM | POA: Diagnosis not present

## 2019-12-20 DIAGNOSIS — D631 Anemia in chronic kidney disease: Secondary | ICD-10-CM | POA: Diagnosis not present

## 2019-12-20 DIAGNOSIS — E875 Hyperkalemia: Secondary | ICD-10-CM | POA: Diagnosis not present

## 2019-12-20 DIAGNOSIS — Z992 Dependence on renal dialysis: Secondary | ICD-10-CM | POA: Diagnosis not present

## 2019-12-20 DIAGNOSIS — D509 Iron deficiency anemia, unspecified: Secondary | ICD-10-CM | POA: Diagnosis not present

## 2019-12-20 DIAGNOSIS — N2581 Secondary hyperparathyroidism of renal origin: Secondary | ICD-10-CM | POA: Diagnosis not present

## 2019-12-20 DIAGNOSIS — N186 End stage renal disease: Secondary | ICD-10-CM | POA: Diagnosis not present

## 2019-12-22 DIAGNOSIS — E875 Hyperkalemia: Secondary | ICD-10-CM | POA: Diagnosis not present

## 2019-12-22 DIAGNOSIS — D631 Anemia in chronic kidney disease: Secondary | ICD-10-CM | POA: Diagnosis not present

## 2019-12-22 DIAGNOSIS — N186 End stage renal disease: Secondary | ICD-10-CM | POA: Diagnosis not present

## 2019-12-22 DIAGNOSIS — Z992 Dependence on renal dialysis: Secondary | ICD-10-CM | POA: Diagnosis not present

## 2019-12-22 DIAGNOSIS — N2581 Secondary hyperparathyroidism of renal origin: Secondary | ICD-10-CM | POA: Diagnosis not present

## 2019-12-22 DIAGNOSIS — D509 Iron deficiency anemia, unspecified: Secondary | ICD-10-CM | POA: Diagnosis not present

## 2019-12-25 DIAGNOSIS — D509 Iron deficiency anemia, unspecified: Secondary | ICD-10-CM | POA: Diagnosis not present

## 2019-12-25 DIAGNOSIS — N186 End stage renal disease: Secondary | ICD-10-CM | POA: Diagnosis not present

## 2019-12-25 DIAGNOSIS — N2581 Secondary hyperparathyroidism of renal origin: Secondary | ICD-10-CM | POA: Diagnosis not present

## 2019-12-25 DIAGNOSIS — D631 Anemia in chronic kidney disease: Secondary | ICD-10-CM | POA: Diagnosis not present

## 2019-12-25 DIAGNOSIS — Z992 Dependence on renal dialysis: Secondary | ICD-10-CM | POA: Diagnosis not present

## 2019-12-25 DIAGNOSIS — E875 Hyperkalemia: Secondary | ICD-10-CM | POA: Diagnosis not present

## 2019-12-27 DIAGNOSIS — D509 Iron deficiency anemia, unspecified: Secondary | ICD-10-CM | POA: Diagnosis not present

## 2019-12-27 DIAGNOSIS — N186 End stage renal disease: Secondary | ICD-10-CM | POA: Diagnosis not present

## 2019-12-27 DIAGNOSIS — D631 Anemia in chronic kidney disease: Secondary | ICD-10-CM | POA: Diagnosis not present

## 2019-12-27 DIAGNOSIS — Z992 Dependence on renal dialysis: Secondary | ICD-10-CM | POA: Diagnosis not present

## 2019-12-27 DIAGNOSIS — N2581 Secondary hyperparathyroidism of renal origin: Secondary | ICD-10-CM | POA: Diagnosis not present

## 2019-12-27 DIAGNOSIS — E875 Hyperkalemia: Secondary | ICD-10-CM | POA: Diagnosis not present

## 2019-12-28 ENCOUNTER — Other Ambulatory Visit: Payer: Self-pay

## 2019-12-28 DIAGNOSIS — I739 Peripheral vascular disease, unspecified: Secondary | ICD-10-CM

## 2019-12-30 DIAGNOSIS — I120 Hypertensive chronic kidney disease with stage 5 chronic kidney disease or end stage renal disease: Secondary | ICD-10-CM | POA: Diagnosis not present

## 2019-12-30 DIAGNOSIS — Z992 Dependence on renal dialysis: Secondary | ICD-10-CM | POA: Diagnosis not present

## 2019-12-30 DIAGNOSIS — N186 End stage renal disease: Secondary | ICD-10-CM | POA: Diagnosis not present

## 2020-01-01 DIAGNOSIS — N2581 Secondary hyperparathyroidism of renal origin: Secondary | ICD-10-CM | POA: Diagnosis not present

## 2020-01-01 DIAGNOSIS — D631 Anemia in chronic kidney disease: Secondary | ICD-10-CM | POA: Diagnosis not present

## 2020-01-01 DIAGNOSIS — N186 End stage renal disease: Secondary | ICD-10-CM | POA: Diagnosis not present

## 2020-01-01 DIAGNOSIS — Z992 Dependence on renal dialysis: Secondary | ICD-10-CM | POA: Diagnosis not present

## 2020-01-01 DIAGNOSIS — E875 Hyperkalemia: Secondary | ICD-10-CM | POA: Diagnosis not present

## 2020-01-03 DIAGNOSIS — N186 End stage renal disease: Secondary | ICD-10-CM | POA: Diagnosis not present

## 2020-01-03 DIAGNOSIS — Z992 Dependence on renal dialysis: Secondary | ICD-10-CM | POA: Diagnosis not present

## 2020-01-03 DIAGNOSIS — D631 Anemia in chronic kidney disease: Secondary | ICD-10-CM | POA: Diagnosis not present

## 2020-01-03 DIAGNOSIS — N2581 Secondary hyperparathyroidism of renal origin: Secondary | ICD-10-CM | POA: Diagnosis not present

## 2020-01-03 DIAGNOSIS — E875 Hyperkalemia: Secondary | ICD-10-CM | POA: Diagnosis not present

## 2020-01-05 DIAGNOSIS — E875 Hyperkalemia: Secondary | ICD-10-CM | POA: Diagnosis not present

## 2020-01-05 DIAGNOSIS — Z992 Dependence on renal dialysis: Secondary | ICD-10-CM | POA: Diagnosis not present

## 2020-01-05 DIAGNOSIS — N2581 Secondary hyperparathyroidism of renal origin: Secondary | ICD-10-CM | POA: Diagnosis not present

## 2020-01-05 DIAGNOSIS — N186 End stage renal disease: Secondary | ICD-10-CM | POA: Diagnosis not present

## 2020-01-05 DIAGNOSIS — D631 Anemia in chronic kidney disease: Secondary | ICD-10-CM | POA: Diagnosis not present

## 2020-01-08 DIAGNOSIS — E875 Hyperkalemia: Secondary | ICD-10-CM | POA: Diagnosis not present

## 2020-01-08 DIAGNOSIS — N2581 Secondary hyperparathyroidism of renal origin: Secondary | ICD-10-CM | POA: Diagnosis not present

## 2020-01-08 DIAGNOSIS — Z992 Dependence on renal dialysis: Secondary | ICD-10-CM | POA: Diagnosis not present

## 2020-01-08 DIAGNOSIS — N186 End stage renal disease: Secondary | ICD-10-CM | POA: Diagnosis not present

## 2020-01-08 DIAGNOSIS — D631 Anemia in chronic kidney disease: Secondary | ICD-10-CM | POA: Diagnosis not present

## 2020-01-10 DIAGNOSIS — E875 Hyperkalemia: Secondary | ICD-10-CM | POA: Diagnosis not present

## 2020-01-10 DIAGNOSIS — N186 End stage renal disease: Secondary | ICD-10-CM | POA: Diagnosis not present

## 2020-01-10 DIAGNOSIS — Z992 Dependence on renal dialysis: Secondary | ICD-10-CM | POA: Diagnosis not present

## 2020-01-10 DIAGNOSIS — N2581 Secondary hyperparathyroidism of renal origin: Secondary | ICD-10-CM | POA: Diagnosis not present

## 2020-01-10 DIAGNOSIS — D631 Anemia in chronic kidney disease: Secondary | ICD-10-CM | POA: Diagnosis not present

## 2020-01-11 ENCOUNTER — Encounter: Payer: Self-pay | Admitting: *Deleted

## 2020-01-11 ENCOUNTER — Encounter: Payer: Self-pay | Admitting: Vascular Surgery

## 2020-01-11 ENCOUNTER — Other Ambulatory Visit: Payer: Self-pay | Admitting: *Deleted

## 2020-01-11 ENCOUNTER — Ambulatory Visit (INDEPENDENT_AMBULATORY_CARE_PROVIDER_SITE_OTHER): Payer: Medicare Other | Admitting: Vascular Surgery

## 2020-01-11 ENCOUNTER — Ambulatory Visit (HOSPITAL_COMMUNITY)
Admission: RE | Admit: 2020-01-11 | Discharge: 2020-01-11 | Disposition: A | Discharge status: Home / Self Care | Payer: Medicare Other | Source: Ambulatory Visit | Attending: Vascular Surgery | Admitting: Vascular Surgery

## 2020-01-11 ENCOUNTER — Other Ambulatory Visit: Payer: Self-pay

## 2020-01-11 VITALS — BP 132/85 | HR 117 | Temp 98.2°F | Resp 20 | Ht 67.0 in | Wt 98.0 lb

## 2020-01-11 DIAGNOSIS — Z992 Dependence on renal dialysis: Secondary | ICD-10-CM

## 2020-01-11 DIAGNOSIS — I739 Peripheral vascular disease, unspecified: Secondary | ICD-10-CM

## 2020-01-11 DIAGNOSIS — N186 End stage renal disease: Secondary | ICD-10-CM

## 2020-01-11 NOTE — Progress Notes (Signed)
Patient ID: Karen Morrow, female   DOB: Aug 24, 1960, 59 y.o.   MRN: [id number]  Reason for Consult: Follow-up   Referred by Karen Pier, MD  Subjective:     HPI:  Karen Morrow is a 59 y.o. female with end-stage renal disease pain in her bilateral lower extremities left greater than right.  She states is worse when she is walking.  She does get cramping in the left leg had very short distance walking.  She also has pain in the middle the night both cramping and just nonradiating shooting pain.  She does not have any tissue loss or ulceration.  She does walk with help of a walker.  Risk factors include current smoking status as well as hypertension and end-stage renal disease  Past Medical History:  Diagnosis Date  . Anginal pain (Karen Morrow)   . Asthma   . Atrial fibrillation (Karen Morrow)    not on AC  . CHF (congestive heart failure) (Karen Morrow)   . Chronic kidney disease    dialysis 3x wk  . Dyspnea   . Frequent bowel movements   . GERD (gastroesophageal reflux disease)   . Hepatitis C antibody test positive   . Hypertension    "just dx'd today" (11/03/2012)   Family History  Problem Relation Age of Onset  . Lupus Mother   . Diabetes Father   . Heart attack Father 3   Past Surgical History:  Procedure Laterality Date  . ABDOMINAL HYSTERECTOMY     "partial" (11/03/2012)  . AV FISTULA PLACEMENT Right 12/03/2013   Procedure: CREATION OF ARTERIOVENOUS (AV) FISTULA RIGHT ARM ;  Surgeon: Rosetta Posner, MD;  Location: [place];  Service: Vascular;  Laterality: Right;  . AV FISTULA PLACEMENT Left 05/17/2018   Procedure: CREATION OF BRACHIOCEPHALIC FISTULA LEFT ARM;  Surgeon: Waynetta Sandy, MD;  Location: [place];  Service: Vascular;  Laterality: Left;  . [place] REMOVAL Right 03/16/2018   Procedure: REMOVAL OF ARTERIOVENOUS GORETEX GRAFT ([place]) RIGHT ARM;  Surgeon: Waynetta Sandy, MD;  Location: [place];  Service: Vascular;  Laterality: Right;  . BASCILIC VEIN TRANSPOSITION  Right 03/06/2014   Procedure: SECOND STAGE BASILIC VEIN TRANSPOSITION;  Surgeon: Rosetta Posner, MD;  Location: [place];  Service: Vascular;  Laterality: Right;  . BIOPSY  09/30/2018   Procedure: BIOPSY;  Surgeon: Mauri Pole, MD;  [facility]COPY;  Service: Endoscopy;;  . BIOPSY  08/01/2019   Procedure: BIOPSY;  Surgeon: Ronald Lobo, MD;  Location: [facility] ENDOSCOPY;  Service: Endoscopy;;  . BIOPSY  10/14/2019   Procedure: BIOPSY;  Surgeon: Doran Stabler, MD;  Location: [facility] ENDOSCOPY;  Service: Gastroenterology;;  . ENTEROSCOPY N/A 10/06/2018   Procedure: ENTEROSCOPY;  Surgeon: Lavena Bullion, DO;  Location: [place];  Service: Gastroenterology;  Laterality: N/A;  . ESOPHAGOGASTRODUODENOSCOPY N/A 06/05/2013   Procedure: ESOPHAGOGASTRODUODENOSCOPY (EGD);  Surgeon: Winfield Cunas., MD;  Location: [facility];  Service: Endoscopy;  Laterality: N/A;  . ESOPHAGOGASTRODUODENOSCOPY N/A 09/08/2016   Procedure: ESOPHAGOGASTRODUODENOSCOPY (EGD);  Surgeon: Milus Banister, MD;  Location: [place];  Service: Endoscopy;  Laterality: N/A;  . ESOPHAGOGASTRODUODENOSCOPY (EGD) WITH PROPOFOL N/A 09/17/2018   Procedure: ESOPHAGOGASTRODUODENOSCOPY (EGD) WITH PROPOFOL;  Surgeon: Carol Ada, MD;  Location: [place];  Service: Endoscopy;  Laterality: N/A;  . ESOPHAGOGASTRODUODENOSCOPY (EGD) WITH PROPOFOL N/A 09/30/2018   Procedure: ESOPHAGOGASTRODUODENOSCOPY (EGD) WITH PROPOFOL;  Surgeon: Mauri Pole, MD;  Location: [place] ENDOSCOPY;  Service: Endoscopy;  Laterality: N/A;  . ESOPHAGOGASTRODUODENOSCOPY (  EGD) WITH PROPOFOL N/A 08/01/2019   Procedure: ESOPHAGOGASTRODUODENOSCOPY (EGD) WITH PROPOFOL;  Surgeon: Ronald Lobo, MD;  Location: [place];  Service: Endoscopy;  Laterality: N/A;  . ESOPHAGOGASTRODUODENOSCOPY (EGD) WITH PROPOFOL N/A 10/14/2019   Procedure: ESOPHAGOGASTRODUODENOSCOPY (EGD) WITH PROPOFOL;  Surgeon: Doran Stabler, MD;  Location: [place];  Service:  Gastroenterology;  Laterality: N/A;  . INSERTION OF DIALYSIS CATHETER Left 03/16/2018   Procedure: PLACEMENT OF TUNNEL DIALYSIS CATHETER;  Surgeon: Waynetta Sandy, MD;  Location: [place];  Service: Vascular;  Laterality: Left;  . INSERTION OF DIALYSIS CATHETER Right 09/26/2018   Procedure: INSERTION OF DIALYSIS  TUNNELED CATHETER;  Surgeon: Elam Dutch, MD;  Location: [facility] OR;  Service: Vascular;  Laterality: Right;  . IR REMOVAL TUN CV CATH W/O FL  09/17/2018  . REVISON OF ARTERIOVENOUS FISTULA Right 01/28/2017   Procedure: REVISON OF RIGHT ARM ARTERIOVENOUS FISTULA USING 8MMX10CM GORETEX GRAFT;  Surgeon: Angelia Mould, MD;  Location: [facility] OR;  Service: Vascular;  Laterality: Right;    Short Social History:  Social History   Tobacco Use  . Smoking status: Current Every Day Smoker    Packs/day: 0.50    Years: 40.00    Pack years: 20.00    Types: Cigarettes  . Smokeless tobacco: Never Used  . Tobacco comment: now down to 1/2 ppd  Substance Use Topics  . Alcohol use: Not Currently    Alcohol/week: 0.0 standard drinks    Comment: never a heavy drinker, hasn't drank in years    Allergies  Allergen Reactions  . Cefazolin Other (See Comments)    Severe thrombocytopenia (has tolerated zosyn in the past) ID aware Tolerated in October 2019  . Cephalosporins Anaphylaxis  . Tuberculin Swelling    Current Outpatient Medications  Medication Sig Dispense Refill  . acetaminophen (TYLENOL) 500 MG tablet Take 500-1,000 mg by mouth every 6 (six) hours as needed for headache (pain).    Marland Kitchen albuterol (PROVENTIL HFA;VENTOLIN HFA) 108 (90 Base) MCG/ACT inhaler TAKE 2 PUFFS BY MOUTH EVERY 6 HOURS AS NEEDED FOR WHEEZE OR SHORTNESS OF BREATH (Patient taking differently: Inhale 2 puffs into the lungs every 6 (six) hours as needed for wheezing or shortness of breath. ) 18 Inhaler 0  . b complex-vitamin c-folic acid (NEPHRO-VITE) 0.8 MG TABS tablet Take 1 tablet by mouth every evening. 30  tablet 3  . calcium acetate (PHOSLO) 667 MG capsule Take 2 capsules (1,334 mg total) by mouth 3 (three) times daily. 90 capsule 1  . cinacalcet (SENSIPAR) 30 MG tablet TAKE 1 TABLET BY MOUTH DAILY EVERY EVENING (DO NOT TAKE LESS THAN 12 HOURS PRIOR TO DIALYSIS) 60 tablet 0  . lidocaine-prilocaine (EMLA) cream Apply 1 application topically See admin instructions. 1 application before dialysis.    Marland Kitchen metoprolol tartrate (LOPRESSOR) 25 MG tablet Take 0.5 tablets (12.5 mg total) by mouth 2 (two) times daily. 30 tablet 3  . mirtazapine (REMERON) 7.5 MG tablet Take 7.5 mg by mouth at bedtime.    . nicotine (NICODERM CQ - DOSED IN MG/24 HOURS) 21 mg/24hr patch Place 1 patch (21 mg total) onto the skin daily. 28 patch 1  . pantoprazole (PROTONIX) 40 MG tablet Take 1 tablet (40 mg total) by mouth 2 (two) times daily before a meal. for 2 months, then once daily 60 tablet 1  . triamcinolone cream (KENALOG) 0.1 % Apply 1 application topically 2 (two) times daily as needed. To right arm for itching 30 g 1   No current facility-administered  medications for this visit.    Review of Systems  Constitutional: Positive for unexpected weight change.  HENT: HENT negative.  Eyes: Eyes negative.  Respiratory: Respiratory negative.  Cardiovascular: Cardiovascular negative.  GI: Gastrointestinal negative.  Musculoskeletal: Musculoskeletal negative.  Skin: Skin negative.  Neurological: Neurological negative. Hematologic: Hematologic/lymphatic negative.  Psychiatric: Psychiatric negative.        Objective:  Objective   Vitals:   01/11/20 1450  BP: 132/85  Pulse: (!) 117  Resp: 20  Temp: 98.2 F (36.8 C)  SpO2: 98%  Weight: 98 lb (44.5 kg)  Height: 5\' 7"  (1.702 m)   Body mass index is 15.35 kg/m.  Physical Exam Constitutional:      Comments: underweight  HENT:     Head: Normocephalic.     Nose:     Comments: Mask in place Eyes:     Pupils: Pupils are equal, round, and reactive to light.    Cardiovascular:     Pulses:          Femoral pulses are 2+ on the right side and 2+ on the left side.      Dorsalis pedis pulses are detected w/ Doppler on the right side and detected w/ Doppler on the left side.       Posterior tibial pulses are detected w/ Doppler on the right side and detected w/ Doppler on the left side.  Pulmonary:     Effort: Pulmonary effort is normal.  Abdominal:     General: Abdomen is flat.     Palpations: Abdomen is soft. There is no mass.  Musculoskeletal:        General: No swelling. Normal range of motion.  Skin:    General: Skin is warm.     Capillary Refill: Capillary refill takes 2 to 3 seconds.  Neurological:     General: No focal deficit present.     Mental Status: She is alert.  Psychiatric:        Mood and Affect: Mood normal.        Behavior: Behavior normal.        Thought Content: Thought content normal.        Judgment: Judgment normal.     Data: Summary:  Right: Resting right ankle-brachial index indicates noncompressible right  lower extremity arteries. The right toe-brachial index is abnormal.   Left: Resting left ankle-brachial index indicates noncompressible left  lower extremity arteries. The left toe-brachial index is abnormal.      Assessment/Plan:    59 year old female ABIs are abnormal likely overestimated due to calcification.  I discussed with her continued watchful waiting versus angiography at this time she wants to pursue angiogram we will start with a right common femoral approach to evaluate both lower extremities possible intervene on the left.  I discussed with her the need for smoking cessation.  We have also discussed a baby aspirin daily.      Waynetta Sandy MD [facility]

## 2020-01-11 NOTE — H&P (View-Only) (Signed)
Patient ID: Karen Morrow, female   DOB: February 20, 1961, 59 y.o.   MRN: [id number]  Reason for Consult: Follow-up   Referred by [name], MD  Subjective:     HPI:  Karen Morrow is a 59 y.o. female with end-stage renal disease pain in her bilateral lower extremities left greater than right.  She states is worse when she is walking.  She does get cramping in the left leg had very short distance walking.  She also has pain in the middle the night both cramping and just nonradiating shooting pain.  She does not have any tissue loss or ulceration.  She does walk with help of a walker.  Risk factors include current smoking status as well as hypertension and end-stage renal disease  Past Medical History:  Diagnosis Date  . Anginal pain ([place])   . Asthma   . Atrial fibrillation ([place])    not on AC  . CHF (congestive heart failure) ([place])   . Chronic kidney disease    dialysis 3x wk  . Dyspnea   . Frequent bowel movements   . GERD (gastroesophageal reflux disease)   . Hepatitis C antibody test positive   . Hypertension    "just dx'd today" (11/03/2012)   Family History  Problem Relation Age of Onset  . Lupus Mother   . Diabetes Father   . Heart attack Father 7   Past Surgical History:  Procedure Laterality Date  . ABDOMINAL HYSTERECTOMY     "partial" (11/03/2012)  . AV FISTULA PLACEMENT Right 12/03/2013   Procedure: CREATION OF ARTERIOVENOUS (AV) FISTULA RIGHT ARM ;  Surgeon: Rosetta Posner, MD;  Location: [place];  Service: Vascular;  Laterality: Right;  . AV FISTULA PLACEMENT Left 05/17/2018   Procedure: CREATION OF BRACHIOCEPHALIC FISTULA LEFT ARM;  Surgeon: Waynetta Sandy, MD;  Location: [place];  Service: Vascular;  Laterality: Left;  . [place] REMOVAL Right 03/16/2018   Procedure: REMOVAL OF ARTERIOVENOUS GORETEX GRAFT ([place]) RIGHT ARM;  Surgeon: Waynetta Sandy, MD;  Location: [place];  Service: Vascular;  Laterality: Right;  . BASCILIC VEIN TRANSPOSITION  Right 03/06/2014   Procedure: SECOND STAGE BASILIC VEIN TRANSPOSITION;  Surgeon: Rosetta Posner, MD;  Location: [place];  Service: Vascular;  Laterality: Right;  . BIOPSY  09/30/2018   Procedure: BIOPSY;  Surgeon: Mauri Pole, MD;  Location: [facility] ENDOSCOPY;  Service: Endoscopy;;  . BIOPSY  08/01/2019   Procedure: BIOPSY;  Surgeon: Ronald Lobo, MD;  Location: [facility] ENDOSCOPY;  Service: Endoscopy;;  . BIOPSY  10/14/2019   Procedure: BIOPSY;  Surgeon: Doran Stabler, MD;  Location: [facility] ENDOSCOPY;  Service: Gastroenterology;;  . ENTEROSCOPY N/A 10/06/2018   Procedure: ENTEROSCOPY;  Surgeon: Lavena Bullion, DO;  Location: [place];  Service: Gastroenterology;  Laterality: N/A;  . ESOPHAGOGASTRODUODENOSCOPY N/A 06/05/2013   Procedure: ESOPHAGOGASTRODUODENOSCOPY (EGD);  Surgeon: Winfield Cunas., MD;  Location: [facility];  Service: Endoscopy;  Laterality: N/A;  . ESOPHAGOGASTRODUODENOSCOPY N/A 09/08/2016   Procedure: ESOPHAGOGASTRODUODENOSCOPY (EGD);  Surgeon: Milus Banister, MD;  Location: [place];  Service: Endoscopy;  Laterality: N/A;  . ESOPHAGOGASTRODUODENOSCOPY (EGD) WITH PROPOFOL N/A 09/17/2018   Procedure: ESOPHAGOGASTRODUODENOSCOPY (EGD) WITH PROPOFOL;  Surgeon: Carol Ada, MD;  Location: [place];  Service: Endoscopy;  Laterality: N/A;  . ESOPHAGOGASTRODUODENOSCOPY (EGD) WITH PROPOFOL N/A 09/30/2018   Procedure: ESOPHAGOGASTRODUODENOSCOPY (EGD) WITH PROPOFOL;  Surgeon: Mauri Pole, MD;  Location: [place] ENDOSCOPY;  Service: Endoscopy;  Laterality: N/A;  . ESOPHAGOGASTRODUODENOSCOPY (  EGD) WITH PROPOFOL N/A 08/01/2019   Procedure: ESOPHAGOGASTRODUODENOSCOPY (EGD) WITH PROPOFOL;  Surgeon: Ronald Lobo, MD;  Location: [place];  Service: Endoscopy;  Laterality: N/A;  . ESOPHAGOGASTRODUODENOSCOPY (EGD) WITH PROPOFOL N/A 10/14/2019   Procedure: ESOPHAGOGASTRODUODENOSCOPY (EGD) WITH PROPOFOL;  Surgeon: Doran Stabler, MD;  Location: [place];  Service:  Gastroenterology;  Laterality: N/A;  . INSERTION OF DIALYSIS CATHETER Left 03/16/2018   Procedure: PLACEMENT OF TUNNEL DIALYSIS CATHETER;  Surgeon: Waynetta Sandy, MD;  Location: [place];  Service: Vascular;  Laterality: Left;  . INSERTION OF DIALYSIS CATHETER Right 09/26/2018   Procedure: INSERTION OF DIALYSIS  TUNNELED CATHETER;  Surgeon: Elam Dutch, MD;  Location: [facility] OR;  Service: Vascular;  Laterality: Right;  . IR REMOVAL TUN CV CATH W/O FL  09/17/2018  . REVISON OF ARTERIOVENOUS FISTULA Right 01/28/2017   Procedure: REVISON OF RIGHT ARM ARTERIOVENOUS FISTULA USING 8MMX10CM GORETEX GRAFT;  Surgeon: Angelia Mould, MD;  Location: [facility] OR;  Service: Vascular;  Laterality: Right;    Short Social History:  Social History   Tobacco Use  . Smoking status: Current Every Day Smoker    Packs/day: 0.50    Years: 40.00    Pack years: 20.00    Types: Cigarettes  . Smokeless tobacco: Never Used  . Tobacco comment: now down to 1/2 ppd  Substance Use Topics  . Alcohol use: Not Currently    Alcohol/week: 0.0 standard drinks    Comment: never a heavy drinker, hasn't drank in years    Allergies  Allergen Reactions  . Cefazolin Other (See Comments)    Severe thrombocytopenia (has tolerated zosyn in the past) ID aware Tolerated in October 2019  . Cephalosporins Anaphylaxis  . Tuberculin Swelling    Current Outpatient Medications  Medication Sig Dispense Refill  . acetaminophen (TYLENOL) 500 MG tablet Take 500-1,000 mg by mouth every 6 (six) hours as needed for headache (pain).    Marland Kitchen albuterol (PROVENTIL HFA;VENTOLIN HFA) 108 (90 Base) MCG/ACT inhaler TAKE 2 PUFFS BY MOUTH EVERY 6 HOURS AS NEEDED FOR WHEEZE OR SHORTNESS OF BREATH (Patient taking differently: Inhale 2 puffs into the lungs every 6 (six) hours as needed for wheezing or shortness of breath. ) 18 Inhaler 0  . b complex-vitamin c-folic acid (NEPHRO-VITE) 0.8 MG TABS tablet Take 1 tablet by mouth every evening. 30  tablet 3  . calcium acetate (PHOSLO) 667 MG capsule Take 2 capsules (1,334 mg total) by mouth 3 (three) times daily. 90 capsule 1  . cinacalcet (SENSIPAR) 30 MG tablet TAKE 1 TABLET BY MOUTH DAILY EVERY EVENING (DO NOT TAKE LESS THAN 12 HOURS PRIOR TO DIALYSIS) 60 tablet 0  . lidocaine-prilocaine (EMLA) cream Apply 1 application topically See admin instructions. 1 application before dialysis.    Marland Kitchen metoprolol tartrate (LOPRESSOR) 25 MG tablet Take 0.5 tablets (12.5 mg total) by mouth 2 (two) times daily. 30 tablet 3  . mirtazapine (REMERON) 7.5 MG tablet Take 7.5 mg by mouth at bedtime.    . nicotine (NICODERM CQ - DOSED IN MG/24 HOURS) 21 mg/24hr patch Place 1 patch (21 mg total) onto the skin daily. 28 patch 1  . pantoprazole (PROTONIX) 40 MG tablet Take 1 tablet (40 mg total) by mouth 2 (two) times daily before a meal. for 2 months, then once daily 60 tablet 1  . triamcinolone cream (KENALOG) 0.1 % Apply 1 application topically 2 (two) times daily as needed. To right arm for itching 30 g 1   No current facility-administered  medications for this visit.    Review of Systems  Constitutional: Positive for unexpected weight change.  HENT: HENT negative.  Eyes: Eyes negative.  Respiratory: Respiratory negative.  Cardiovascular: Cardiovascular negative.  GI: Gastrointestinal negative.  Musculoskeletal: Musculoskeletal negative.  Skin: Skin negative.  Neurological: Neurological negative. Hematologic: Hematologic/lymphatic negative.  Psychiatric: Psychiatric negative.        Objective:  Objective   Vitals:   01/11/20 1450  BP: 132/85  Pulse: (!) 117  Resp: 20  Temp: 98.2 F (36.8 C)  SpO2: 98%  Weight: 98 lb (44.5 kg)  Height: 5\' 7"  (1.702 m)   Body mass index is 15.35 kg/m.  Physical Exam Constitutional:      Comments: underweight  HENT:     Head: Normocephalic.     Nose:     Comments: Mask in place Eyes:     Pupils: Pupils are equal, round, and reactive to light.    Cardiovascular:     Pulses:          Femoral pulses are 2+ on the right side and 2+ on the left side.      Dorsalis pedis pulses are detected w/ Doppler on the right side and detected w/ Doppler on the left side.       Posterior tibial pulses are detected w/ Doppler on the right side and detected w/ Doppler on the left side.  Pulmonary:     Effort: Pulmonary effort is normal.  Abdominal:     General: Abdomen is flat.     Palpations: Abdomen is soft. There is no mass.  Musculoskeletal:        General: No swelling. Normal range of motion.  Skin:    General: Skin is warm.     Capillary Refill: Capillary refill takes 2 to 3 seconds.  Neurological:     General: No focal deficit present.     Mental Status: She is alert.  Psychiatric:        Mood and Affect: Mood normal.        Behavior: Behavior normal.        Thought Content: Thought content normal.        Judgment: Judgment normal.     Data: Summary:  Right: Resting right ankle-brachial index indicates noncompressible right  lower extremity arteries. The right toe-brachial index is abnormal.   Left: Resting left ankle-brachial index indicates noncompressible left  lower extremity arteries. The left toe-brachial index is abnormal.      Assessment/Plan:    59 year old female ABIs are abnormal likely overestimated due to calcification.  I discussed with her continued watchful waiting versus angiography at this time she wants to pursue angiogram we will start with a right common femoral approach to evaluate both lower extremities possible intervene on the left.  I discussed with her the need for smoking cessation.  We have also discussed a baby aspirin daily.      Waynetta Sandy MD [facility]

## 2020-01-15 DIAGNOSIS — D631 Anemia in chronic kidney disease: Secondary | ICD-10-CM | POA: Diagnosis not present

## 2020-01-15 DIAGNOSIS — Z992 Dependence on renal dialysis: Secondary | ICD-10-CM | POA: Diagnosis not present

## 2020-01-15 DIAGNOSIS — N2581 Secondary hyperparathyroidism of renal origin: Secondary | ICD-10-CM | POA: Diagnosis not present

## 2020-01-15 DIAGNOSIS — E875 Hyperkalemia: Secondary | ICD-10-CM | POA: Diagnosis not present

## 2020-01-15 DIAGNOSIS — N186 End stage renal disease: Secondary | ICD-10-CM | POA: Diagnosis not present

## 2020-01-17 DIAGNOSIS — Z992 Dependence on renal dialysis: Secondary | ICD-10-CM | POA: Diagnosis not present

## 2020-01-17 DIAGNOSIS — N186 End stage renal disease: Secondary | ICD-10-CM | POA: Diagnosis not present

## 2020-01-17 DIAGNOSIS — N2581 Secondary hyperparathyroidism of renal origin: Secondary | ICD-10-CM | POA: Diagnosis not present

## 2020-01-17 DIAGNOSIS — D631 Anemia in chronic kidney disease: Secondary | ICD-10-CM | POA: Diagnosis not present

## 2020-01-17 DIAGNOSIS — E875 Hyperkalemia: Secondary | ICD-10-CM | POA: Diagnosis not present

## 2020-01-18 ENCOUNTER — Other Ambulatory Visit (HOSPITAL_COMMUNITY)
Admission: RE | Admit: 2020-01-18 | Discharge: 2020-01-18 | Disposition: A | Discharge status: Home / Self Care | Payer: Medicare Other | Source: Ambulatory Visit | Attending: Vascular Surgery | Admitting: Vascular Surgery

## 2020-01-18 DIAGNOSIS — Z01812 Encounter for preprocedural laboratory examination: Secondary | ICD-10-CM | POA: Insufficient documentation

## 2020-01-18 DIAGNOSIS — Z20822 Contact with and (suspected) exposure to covid-19: Secondary | ICD-10-CM | POA: Insufficient documentation

## 2020-01-18 LAB — SARS CORONAVIRUS 2 (TAT 6-24 HRS): SARS Coronavirus 2: NEGATIVE

## 2020-01-19 DIAGNOSIS — N186 End stage renal disease: Secondary | ICD-10-CM | POA: Diagnosis not present

## 2020-01-19 DIAGNOSIS — D631 Anemia in chronic kidney disease: Secondary | ICD-10-CM | POA: Diagnosis not present

## 2020-01-19 DIAGNOSIS — N2581 Secondary hyperparathyroidism of renal origin: Secondary | ICD-10-CM | POA: Diagnosis not present

## 2020-01-19 DIAGNOSIS — E875 Hyperkalemia: Secondary | ICD-10-CM | POA: Diagnosis not present

## 2020-01-19 DIAGNOSIS — Z992 Dependence on renal dialysis: Secondary | ICD-10-CM | POA: Diagnosis not present

## 2020-01-21 ENCOUNTER — Encounter (HOSPITAL_COMMUNITY)
Admission: RE | Disposition: A | Discharge status: Home / Self Care | Payer: Self-pay | Source: Home / Self Care | Attending: Vascular Surgery

## 2020-01-21 ENCOUNTER — Ambulatory Visit (HOSPITAL_COMMUNITY)
Admission: RE | Admit: 2020-01-21 | Discharge: 2020-01-21 | Disposition: A | Discharge status: Home / Self Care | Payer: Medicare Other | Attending: Vascular Surgery | Admitting: Vascular Surgery

## 2020-01-21 ENCOUNTER — Other Ambulatory Visit (HOSPITAL_COMMUNITY): Payer: Self-pay | Admitting: Vascular Surgery

## 2020-01-21 ENCOUNTER — Other Ambulatory Visit: Payer: Self-pay

## 2020-01-21 DIAGNOSIS — I70223 Atherosclerosis of native arteries of extremities with rest pain, bilateral legs: Secondary | ICD-10-CM | POA: Insufficient documentation

## 2020-01-21 DIAGNOSIS — I132 Hypertensive heart and chronic kidney disease with heart failure and with stage 5 chronic kidney disease, or end stage renal disease: Secondary | ICD-10-CM | POA: Insufficient documentation

## 2020-01-21 DIAGNOSIS — Z888 Allergy status to other drugs, medicaments and biological substances status: Secondary | ICD-10-CM | POA: Insufficient documentation

## 2020-01-21 DIAGNOSIS — I509 Heart failure, unspecified: Secondary | ICD-10-CM | POA: Insufficient documentation

## 2020-01-21 DIAGNOSIS — K219 Gastro-esophageal reflux disease without esophagitis: Secondary | ICD-10-CM | POA: Diagnosis not present

## 2020-01-21 DIAGNOSIS — Z992 Dependence on renal dialysis: Secondary | ICD-10-CM | POA: Insufficient documentation

## 2020-01-21 DIAGNOSIS — B1920 Unspecified viral hepatitis C without hepatic coma: Secondary | ICD-10-CM | POA: Insufficient documentation

## 2020-01-21 DIAGNOSIS — Z8249 Family history of ischemic heart disease and other diseases of the circulatory system: Secondary | ICD-10-CM | POA: Diagnosis not present

## 2020-01-21 DIAGNOSIS — Z79899 Other long term (current) drug therapy: Secondary | ICD-10-CM | POA: Diagnosis not present

## 2020-01-21 DIAGNOSIS — I4891 Unspecified atrial fibrillation: Secondary | ICD-10-CM | POA: Insufficient documentation

## 2020-01-21 DIAGNOSIS — J45909 Unspecified asthma, uncomplicated: Secondary | ICD-10-CM | POA: Insufficient documentation

## 2020-01-21 DIAGNOSIS — F17210 Nicotine dependence, cigarettes, uncomplicated: Secondary | ICD-10-CM | POA: Diagnosis not present

## 2020-01-21 DIAGNOSIS — Z95828 Presence of other vascular implants and grafts: Secondary | ICD-10-CM | POA: Diagnosis not present

## 2020-01-21 DIAGNOSIS — Z881 Allergy status to other antibiotic agents status: Secondary | ICD-10-CM | POA: Insufficient documentation

## 2020-01-21 DIAGNOSIS — N186 End stage renal disease: Secondary | ICD-10-CM | POA: Diagnosis not present

## 2020-01-21 HISTORY — PX: ABDOMINAL AORTOGRAM W/LOWER EXTREMITY: CATH118223

## 2020-01-21 LAB — POCT I-STAT, CHEM 8
BUN: 13 mg/dL (ref 6–20)
Calcium, Ion: 0.88 mmol/L — CL (ref 1.15–1.40)
Chloride: 98 mmol/L (ref 98–111)
Creatinine, Ser: 4.3 mg/dL — ABNORMAL HIGH (ref 0.44–1.00)
Glucose, Bld: 72 mg/dL (ref 70–99)
HCT: 34 % — ABNORMAL LOW (ref 36.0–46.0)
Hemoglobin: 11.6 g/dL — ABNORMAL LOW (ref 12.0–15.0)
Potassium: 5.1 mmol/L (ref 3.5–5.1)
Sodium: 129 mmol/L — ABNORMAL LOW (ref 135–145)
TCO2: 26 mmol/L (ref 22–32)

## 2020-01-21 LAB — POCT ACTIVATED CLOTTING TIME
Activated Clotting Time: 202 seconds
Activated Clotting Time: 169 seconds

## 2020-01-21 SURGERY — ABDOMINAL AORTOGRAM W/LOWER EXTREMITY
Anesthesia: LOCAL | Laterality: Bilateral

## 2020-01-21 MED ORDER — MORPHINE SULFATE (PF) 2 MG/ML IV SOLN: 2.0000 mg | INTRAVENOUS | Status: DC | PRN

## 2020-01-21 MED ORDER — LIDOCAINE HCL (PF) 1 % IJ SOLN
INTRAMUSCULAR | Status: DC | PRN
  Administered 2020-01-21: 9 mL

## 2020-01-21 MED ORDER — SODIUM CHLORIDE 0.9% FLUSH: 3.0000 mL | INTRAVENOUS | Status: DC | PRN

## 2020-01-21 MED ORDER — CLOPIDOGREL BISULFATE 75 MG PO TABS: 75.0000 mg | ORAL_TABLET | Freq: Every day | ORAL | Status: DC

## 2020-01-21 MED ORDER — FENTANYL CITRATE (PF) 100 MCG/2ML IJ SOLN
INTRAMUSCULAR | Status: AC
  Filled 2020-01-21: qty 2

## 2020-01-21 MED ORDER — HEPARIN SODIUM (PORCINE) 1000 UNIT/ML IJ SOLN
INTRAMUSCULAR | Status: AC
  Filled 2020-01-21: qty 1

## 2020-01-21 MED ORDER — CLOPIDOGREL BISULFATE 75 MG PO TABS: 300.0000 mg | ORAL_TABLET | Freq: Once | ORAL | Status: DC

## 2020-01-21 MED ORDER — LABETALOL HCL 5 MG/ML IV SOLN
INTRAVENOUS | Status: AC
  Filled 2020-01-21: qty 4

## 2020-01-21 MED ORDER — SODIUM CHLORIDE 0.9 % IV SOLN: 250.0000 mL | INTRAVENOUS | Status: DC | PRN

## 2020-01-21 MED ORDER — ONDANSETRON HCL 4 MG/2ML IJ SOLN: 4.0000 mg | Freq: Four times a day (QID) | INTRAMUSCULAR | Status: DC | PRN

## 2020-01-21 MED ORDER — IOHEXOL 350 MG/ML SOLN: INTRAVENOUS | Status: DC | PRN

## 2020-01-21 MED ORDER — SODIUM CHLORIDE 0.9% FLUSH: 3.0000 mL | Freq: Two times a day (BID) | INTRAVENOUS | Status: DC

## 2020-01-21 MED ORDER — CLOPIDOGREL BISULFATE 300 MG PO TABS
ORAL_TABLET | ORAL | Status: AC
  Filled 2020-01-21: qty 1

## 2020-01-21 MED ORDER — CLOPIDOGREL BISULFATE 75 MG PO TABS: 75.0000 mg | ORAL_TABLET | Freq: Every day | ORAL | 11 refills | Status: DC

## 2020-01-21 MED ORDER — FENTANYL CITRATE (PF) 100 MCG/2ML IJ SOLN
INTRAMUSCULAR | Status: DC | PRN
Start: 2020-01-21 — End: 2020-01-21
  Administered 2020-01-21: 25 ug via INTRAVENOUS

## 2020-01-21 MED ORDER — ACETAMINOPHEN 325 MG PO TABS: 650.0000 mg | ORAL_TABLET | ORAL | Status: DC | PRN

## 2020-01-21 MED ORDER — SODIUM CHLORIDE 0.9 % IV SOLN
INTRAVENOUS | Status: AC | PRN
  Administered 2020-01-21: 10 mL/h via INTRAVENOUS

## 2020-01-21 MED ORDER — HYDRALAZINE HCL 20 MG/ML IJ SOLN
INTRAMUSCULAR | Status: AC
  Filled 2020-01-21: qty 1

## 2020-01-21 MED ORDER — CLOPIDOGREL BISULFATE 300 MG PO TABS
ORAL_TABLET | ORAL | Status: DC | PRN
  Administered 2020-01-21: 300 mg via ORAL

## 2020-01-21 MED ORDER — HEPARIN (PORCINE) IN NACL 1000-0.9 UT/500ML-% IV SOLN
INTRAVENOUS | Status: AC
  Filled 2020-01-21: qty 1000

## 2020-01-21 MED ORDER — LABETALOL HCL 5 MG/ML IV SOLN
10.0000 mg | INTRAVENOUS | Status: DC | PRN
  Administered 2020-01-21 (×2): 10 mg via INTRAVENOUS

## 2020-01-21 MED ORDER — HYDRALAZINE HCL 20 MG/ML IJ SOLN
5.0000 mg | INTRAMUSCULAR | Status: DC | PRN
  Administered 2020-01-21: 5 mg via INTRAVENOUS

## 2020-01-21 MED ORDER — LABETALOL HCL 5 MG/ML IV SOLN
INTRAVENOUS | Status: DC | PRN
  Administered 2020-01-21 (×2): 10 mg via INTRAVENOUS

## 2020-01-21 MED ORDER — HEPARIN (PORCINE) IN NACL 1000-0.9 UT/500ML-% IV SOLN
INTRAVENOUS | Status: DC | PRN
  Administered 2020-01-21 (×2): 500 mL

## 2020-01-21 MED ORDER — OXYCODONE HCL 5 MG PO TABS: 5.0000 mg | ORAL_TABLET | ORAL | Status: DC | PRN

## 2020-01-21 MED ORDER — IODIXANOL 320 MG/ML IV SOLN
INTRAVENOUS | Status: DC | PRN
  Administered 2020-01-21: 145 mL

## 2020-01-21 MED ORDER — ASPIRIN EC 81 MG PO TBEC: 81.0000 mg | DELAYED_RELEASE_TABLET | Freq: Every day | ORAL | Status: DC

## 2020-01-21 MED ORDER — HEPARIN SODIUM (PORCINE) 1000 UNIT/ML IJ SOLN
INTRAMUSCULAR | Status: DC | PRN
  Administered 2020-01-21: 5000 [IU] via INTRAVENOUS

## 2020-01-21 MED ORDER — ROSUVASTATIN CALCIUM 10 MG PO TABS: 10.0000 mg | ORAL_TABLET | Freq: Every day | ORAL | Status: DC

## 2020-01-21 MED ORDER — LIDOCAINE HCL (PF) 1 % IJ SOLN
INTRAMUSCULAR | Status: AC
  Filled 2020-01-21: qty 30

## 2020-01-21 MED ORDER — ROSUVASTATIN CALCIUM 10 MG PO TABS: 10.0000 mg | ORAL_TABLET | Freq: Every day | ORAL | 11 refills | Status: DC

## 2020-01-21 SURGICAL SUPPLY — 19 items
BALLN MUSTANG 5X60X135 (BALLOONS) ×3
BALLOON MUSTANG 5X60X135 (BALLOONS) ×2 IMPLANT
CATH OMNI FLUSH 5F 65CM (CATHETERS) ×3 IMPLANT
CATH QUICKCROSS SUPP .035X90CM (MICROCATHETER) ×3 IMPLANT
DEVICE TORQUE .025-.038 (MISCELLANEOUS) ×3 IMPLANT
GLIDEWIRE ADV .035X260CM (WIRE) ×3 IMPLANT
KIT ENCORE 26 ADVANTAGE (KITS) ×3 IMPLANT
KIT MICROPUNCTURE NIT STIFF (SHEATH) ×3 IMPLANT
KIT PV (KITS) ×3 IMPLANT
SHEATH FLEXOR ANSEL 1 7F 45CM (SHEATH) ×3 IMPLANT
SHEATH HIGHFLEX ANSEL 7FR 55CM (SHEATH) ×3 IMPLANT
SHEATH PINNACLE 5F 10CM (SHEATH) ×3 IMPLANT
SHEATH PROBE COVER 6X72 (BAG) ×3 IMPLANT
STENT ELUVIA 6X60X130 (Permanent Stent) ×3 IMPLANT
SYR MEDRAD MARK V 150ML (SYRINGE) ×3 IMPLANT
TRANSDUCER W/STOPCOCK (MISCELLANEOUS) ×3 IMPLANT
TRAY PV CATH (CUSTOM PROCEDURE TRAY) ×3 IMPLANT
WIRE AMPLATZ SS-J .035X180CM (WIRE) ×3 IMPLANT
WIRE BENTSON .035X145CM (WIRE) ×3 IMPLANT

## 2020-01-21 NOTE — Op Note (Signed)
    Patient name: Karen Morrow MRN: [id number] DOB: 07-23-1960 Sex: female  01/21/2020 Pre-operative Diagnosis: Bilateral lower extremity rest pain Post-operative diagnosis:  Same Surgeon:  Erlene Quan C. Donzetta Matters, MD Procedure Performed: 1.  Ultrasound-guided cannulation right common femoral artery 2.  Aortogram with bilateral lower extremity runoff 3.  Primary drug eluding stent placement with 6 x 60 mm balloon Elluvia of left SFA   Indications: 59 year old female with bilateral lower extremity rest pain.  She has evidence of lower extremity ischemic disease is indicated for aortogram possible treatment of the left lower extremity.  Findings: Aortoiliac segments were heavily calcified.  Renal arteries do not fill consistent with dialysis.  Given her heavy calcification crossing the bifurcation was quite difficult.  In the left SFA there was heavy calcification leading to functional occlusion.  I had initially planned for atherectomy but given the difficulty crossing the bifurcation we elected for primary stenting.  Following stenting there was 0% residual stenosis in the SFA.  She has three-vessel runoff on the left side.  She does have some residual disease in her popliteal artery at the level of the knee.  On the right side she is also heavily calcified.  SFA does not have any flow-limiting stenosis popliteal artery occludes at the knee reconstitutes via the distal popliteal with three-vessel runoff to the ankle.  We will see if left lower extremity symptoms are improved and plan for right lower extremity angiography with possible stenting of the popliteal artery.   Procedure:  The patient was identified in the holding area and taken to room 8.  The patient was then placed supine on the table and prepped and draped in the usual sterile fashion.  A time out was called.  Ultrasound was used to evaluate the right common femoral artery.  This was heavily calcified.  We were able to anesthetize the area  cannulate with micropuncture needle followed by wire sheath.  And images saved the permanent record.  We placed a Bentson wire followed by 5 [place] sheath.  Omni catheter was placed to the level of L1 aortogram performed followed by bilateral extremity runoff.  With the above findings we then crossed the bifurcation with Glidewire advantage.  We attempted to place a long 6 [place] sheath.  However this was quite difficult.  We ended up placing an Amplatz wire followed by a 55 cm dilator with a 45 cm sheath.  This was a 7 [place] sheath given that we are planning for arthrectomy.  Ultimately I was able to get the sheath into the left common femoral artery the patient was fully heparinized.  We used Glidewire advantage and quick cross catheter to cross the highly calcified functionally occluded area in the left SFA confirmed intraluminal access distally.  We then exchanged for drug-eluting stent primarily placed in this and then postdilated with 5 mm balloon.  Completion demonstrated no residual stenosis.  With that we retracted our sheath into the right external iliac artery perform retrograde angiography.  This all appeared to be satisfactory.  Satisfied we remove the wire.  Sheath we pulled in postoperative holding.  She tolerated procedure without any complication.  Contrast: 145 cc    Apryle Stowell C. Donzetta Matters, MD [facility] [place] Office: [phone] Pager: [phone]

## 2020-01-21 NOTE — Progress Notes (Signed)
Pt is being transferred to short stay.  Pt has received 2 does of labetolol and one dose of apresoline during her recovery stay.  Her BPs prior to recovery were as high as the low 159E for the systolic.  BP is currently 170/93.

## 2020-01-21 NOTE — Interval H&P Note (Signed)
History and Physical Interval Note:  01/21/2020 7:45 AM  Karen Morrow  has presented today for surgery, with the diagnosis of pad.  The various methods of treatment have been discussed with the patient and family. After consideration of risks, benefits and other options for treatment, the patient has consented to  Procedure(s): ABDOMINAL AORTOGRAM W/LOWER EXTREMITY (N/A) as a surgical intervention.  The patient's history has been reviewed, patient examined, no change in status, stable for surgery.  I have reviewed the patient's chart and labs.  Questions were answered to the patient's satisfaction.     Servando Snare

## 2020-01-21 NOTE — Progress Notes (Signed)
Site: right femoral Sheath Size: 2fr long, arterial Condition prior to removal:  Level 0 Type of pressure held: manual Time pressure held: 35 minutes Status of patient during pull: stable Condition of site post pull:  Level 0 Type of dressing applied: transparent Pulses verified: dopplered - DP and PT / marked Patient's condition post pull: stable Bedrest begins at: 1205 Post instructions given to patient: yes / patient is memory impaired

## 2020-01-21 NOTE — Discharge Instructions (Signed)
Femoral Site Care This sheet gives you information about how to care for yourself after your procedure. Your health care provider may also give you more specific instructions. If you have problems or questions, contact your health care provider. What can I expect after the procedure?  After the procedure, it is common to have:  Bruising that usually fades within 1-2 weeks.  Tenderness at the site. Follow these instructions at home: Wound care 1. Follow instructions from your health care provider about how to take care of your insertion site. Make sure you: ? Wash your hands with soap and water before you change your bandage (dressing). If soap and water are not available, use hand sanitizer. ? Remove your dressing as told by your health care provider. In 24 hours 2. Do not take baths, swim, or use a hot tub until your health care provider approves. 3. You may shower 24-48 hours after the procedure or as told by your health care provider. ? Gently wash the site with plain soap and water. ? Pat the area dry with a clean towel. ? Do not rub the site. This may cause bleeding. 4. Do not apply powder or lotion to the site. Keep the site clean and dry. 5. Check your femoral site every day for signs of infection. Check for: ? Redness, swelling, or pain. ? Fluid or blood. ? Warmth. ? Pus or a bad smell. Activity 1. For the first 2-3 days after your procedure, or as long as directed: ? Avoid climbing stairs as much as possible. ? Do not squat. 2. Do not lift anything that is heavier than 10 lb (4.5 kg), or the limit that you are told, until your health care provider says that it is safe. For 5 days 3. Rest as directed. ? Avoid sitting for a long time without moving. Get up to take short walks every 1-2 hours. 4. Do not drive for 24 hours if you were given a medicine to help you relax (sedative). General instructions  Take over-the-counter and prescription medicines only as told by your  health care provider.  Keep all follow-up visits as told by your health care provider. This is important. Contact a health care provider if you have:  A fever or chills.  You have redness, swelling, or pain around your insertion site. Get help right away if:  The catheter insertion area swells very fast.  You pass out.  You suddenly start to sweat or your skin gets clammy.  The catheter insertion area is bleeding, and the bleeding does not stop when you hold steady pressure on the area.  The area near or just beyond the catheter insertion site becomes pale, cool, tingly, or numb. These symptoms may represent a serious problem that is an emergency. Do not wait to see if the symptoms will go away. Get medical help right away. Call your local emergency services (911 in the [place] Do not drive yourself to the hospital. Summary  After the procedure, it is common to have bruising that usually fades within 1-2 weeks.  Check your femoral site every day for signs of infection.  Do not lift anything that is heavier than 10 lb (4.5 kg), or the limit that you are told, until your health care provider says that it is safe. This information is not intended to replace advice given to you by your health care provider. Make sure you discuss any questions you have with your health care provider. Document Revised: 05/30/2017 Document Reviewed: 05/30/2017   [employer]vier Patient Education  2020 [employer]   

## 2020-01-21 NOTE — Progress Notes (Signed)
Notified Eliezer Champagne of low Na and Ca.

## 2020-01-22 ENCOUNTER — Encounter (HOSPITAL_COMMUNITY): Payer: Self-pay | Admitting: Vascular Surgery

## 2020-01-22 DIAGNOSIS — Z992 Dependence on renal dialysis: Secondary | ICD-10-CM | POA: Diagnosis not present

## 2020-01-22 DIAGNOSIS — N186 End stage renal disease: Secondary | ICD-10-CM | POA: Diagnosis not present

## 2020-01-22 DIAGNOSIS — E875 Hyperkalemia: Secondary | ICD-10-CM | POA: Diagnosis not present

## 2020-01-22 DIAGNOSIS — N2581 Secondary hyperparathyroidism of renal origin: Secondary | ICD-10-CM | POA: Diagnosis not present

## 2020-01-22 DIAGNOSIS — D631 Anemia in chronic kidney disease: Secondary | ICD-10-CM | POA: Diagnosis not present

## 2020-01-24 DIAGNOSIS — N2581 Secondary hyperparathyroidism of renal origin: Secondary | ICD-10-CM | POA: Diagnosis not present

## 2020-01-24 DIAGNOSIS — E875 Hyperkalemia: Secondary | ICD-10-CM | POA: Diagnosis not present

## 2020-01-24 DIAGNOSIS — Z992 Dependence on renal dialysis: Secondary | ICD-10-CM | POA: Diagnosis not present

## 2020-01-24 DIAGNOSIS — D631 Anemia in chronic kidney disease: Secondary | ICD-10-CM | POA: Diagnosis not present

## 2020-01-24 DIAGNOSIS — N186 End stage renal disease: Secondary | ICD-10-CM | POA: Diagnosis not present

## 2020-01-25 MED FILL — CLOPIDOGREL 75 MG TABLET: 75 | 30 days supply | Qty: 30 | Fill #0

## 2020-01-25 MED FILL — ROSUVASTATIN CALCIUM 10 MG: 10 | 30 days supply | Qty: 30 | Fill #0

## 2020-01-25 NOTE — Progress Notes (Signed)
Pt's husband called stating he was not going to pay for new medications (statin and plavix). Informed him to call [facility] to see options available to them.

## 2020-01-26 DIAGNOSIS — N2581 Secondary hyperparathyroidism of renal origin: Secondary | ICD-10-CM | POA: Diagnosis not present

## 2020-01-26 DIAGNOSIS — E875 Hyperkalemia: Secondary | ICD-10-CM | POA: Diagnosis not present

## 2020-01-26 DIAGNOSIS — N186 End stage renal disease: Secondary | ICD-10-CM | POA: Diagnosis not present

## 2020-01-26 DIAGNOSIS — Z992 Dependence on renal dialysis: Secondary | ICD-10-CM | POA: Diagnosis not present

## 2020-01-26 DIAGNOSIS — D631 Anemia in chronic kidney disease: Secondary | ICD-10-CM | POA: Diagnosis not present

## 2020-01-30 DIAGNOSIS — N186 End stage renal disease: Secondary | ICD-10-CM | POA: Diagnosis not present

## 2020-01-30 DIAGNOSIS — I120 Hypertensive chronic kidney disease with stage 5 chronic kidney disease or end stage renal disease: Secondary | ICD-10-CM | POA: Diagnosis not present

## 2020-01-30 DIAGNOSIS — Z992 Dependence on renal dialysis: Secondary | ICD-10-CM | POA: Diagnosis not present

## 2020-01-31 DIAGNOSIS — E875 Hyperkalemia: Secondary | ICD-10-CM | POA: Diagnosis not present

## 2020-01-31 DIAGNOSIS — N186 End stage renal disease: Secondary | ICD-10-CM | POA: Diagnosis not present

## 2020-01-31 DIAGNOSIS — N2581 Secondary hyperparathyroidism of renal origin: Secondary | ICD-10-CM | POA: Diagnosis not present

## 2020-01-31 DIAGNOSIS — Z23 Encounter for immunization: Secondary | ICD-10-CM | POA: Diagnosis not present

## 2020-01-31 DIAGNOSIS — Z992 Dependence on renal dialysis: Secondary | ICD-10-CM | POA: Diagnosis not present

## 2020-01-31 DIAGNOSIS — D631 Anemia in chronic kidney disease: Secondary | ICD-10-CM | POA: Diagnosis not present

## 2020-01-31 DIAGNOSIS — R509 Fever, unspecified: Secondary | ICD-10-CM | POA: Diagnosis not present

## 2020-02-01 ENCOUNTER — Other Ambulatory Visit: Payer: Self-pay

## 2020-02-01 ENCOUNTER — Telehealth: Payer: Self-pay | Admitting: Internal Medicine

## 2020-02-01 ENCOUNTER — Telehealth: Payer: Self-pay

## 2020-02-01 MED ORDER — CLOPIDOGREL BISULFATE 75 MG PO TABS: 75.0000 mg | ORAL_TABLET | Freq: Every day | ORAL | 11 refills | Status: DC

## 2020-02-01 NOTE — Telephone Encounter (Signed)
I tried calling patient to see if they were contacted by the pharmacy.  I left a voice message to call us back.  Shatika Grinnell.

## 2020-02-01 NOTE — Telephone Encounter (Addendum)
I spoke to the Pharmacist/Tech about the patient's problem.  She did not know off-hand how much money they could save Mrs. Haring but they will call the patient once they found out.  I will follow up with the patient later.  Zykia Walla.   ----- Message from Hessie Dibble, RN sent at 02/01/2020 11:01 AM EDT ----- Regarding: FW: Please call  ----- Message ----- From: Sunday Shams, RN Sent: 01/23/2020  10:05 AM EDT To: Hessie Dibble, RN, Blinda Leatherwood, NT Subject: Please call                                    Stanton Kidney,  Please call Ms Suliman husband, he said he has called office.  He refuses to get her medicines, he says they were $500 when he went to pick them up.  He has called short stay a couple of times and says office has not called him back.  Thanks  [employer]

## 2020-02-01 NOTE — Telephone Encounter (Signed)
Karen Morrow, from [facility], called stating that the pt is having a hard time getting her plavix reduced to an acceptable price. She is requesting to know if the pt has prescription called in through office if it will be cheaper. Please advise.

## 2020-02-05 DIAGNOSIS — Z992 Dependence on renal dialysis: Secondary | ICD-10-CM | POA: Diagnosis not present

## 2020-02-05 DIAGNOSIS — E875 Hyperkalemia: Secondary | ICD-10-CM | POA: Diagnosis not present

## 2020-02-05 DIAGNOSIS — N186 End stage renal disease: Secondary | ICD-10-CM | POA: Diagnosis not present

## 2020-02-05 DIAGNOSIS — N2581 Secondary hyperparathyroidism of renal origin: Secondary | ICD-10-CM | POA: Diagnosis not present

## 2020-02-05 DIAGNOSIS — D631 Anemia in chronic kidney disease: Secondary | ICD-10-CM | POA: Diagnosis not present

## 2020-02-05 DIAGNOSIS — R509 Fever, unspecified: Secondary | ICD-10-CM | POA: Diagnosis not present

## 2020-02-05 NOTE — Telephone Encounter (Signed)
Will forward to pcp

## 2020-02-06 ENCOUNTER — Other Ambulatory Visit: Payer: Self-pay | Admitting: Pharmacist

## 2020-02-06 MED ORDER — CLOPIDOGREL BISULFATE 75 MG PO TABS: 75.0000 mg | ORAL_TABLET | Freq: Every day | ORAL | 11 refills | Status: DC

## 2020-02-06 NOTE — Telephone Encounter (Signed)
Pt's Plavix is $1.30 through insurance at our pharmacy. I have resent her rx to our pharmacy.

## 2020-02-06 NOTE — Telephone Encounter (Signed)
Lurena Joiner would you be able to find out how much plavix would be for pt

## 2020-02-07 DIAGNOSIS — Z992 Dependence on renal dialysis: Secondary | ICD-10-CM | POA: Diagnosis not present

## 2020-02-07 DIAGNOSIS — R509 Fever, unspecified: Secondary | ICD-10-CM | POA: Diagnosis not present

## 2020-02-07 DIAGNOSIS — N186 End stage renal disease: Secondary | ICD-10-CM | POA: Diagnosis not present

## 2020-02-07 DIAGNOSIS — D631 Anemia in chronic kidney disease: Secondary | ICD-10-CM | POA: Diagnosis not present

## 2020-02-07 DIAGNOSIS — E875 Hyperkalemia: Secondary | ICD-10-CM | POA: Diagnosis not present

## 2020-02-07 DIAGNOSIS — N2581 Secondary hyperparathyroidism of renal origin: Secondary | ICD-10-CM | POA: Diagnosis not present

## 2020-02-09 DIAGNOSIS — N2581 Secondary hyperparathyroidism of renal origin: Secondary | ICD-10-CM | POA: Diagnosis not present

## 2020-02-09 DIAGNOSIS — N186 End stage renal disease: Secondary | ICD-10-CM | POA: Diagnosis not present

## 2020-02-09 DIAGNOSIS — D631 Anemia in chronic kidney disease: Secondary | ICD-10-CM | POA: Diagnosis not present

## 2020-02-09 DIAGNOSIS — Z992 Dependence on renal dialysis: Secondary | ICD-10-CM | POA: Diagnosis not present

## 2020-02-09 DIAGNOSIS — E875 Hyperkalemia: Secondary | ICD-10-CM | POA: Diagnosis not present

## 2020-02-09 DIAGNOSIS — R509 Fever, unspecified: Secondary | ICD-10-CM | POA: Diagnosis not present

## 2020-02-12 DIAGNOSIS — E875 Hyperkalemia: Secondary | ICD-10-CM | POA: Diagnosis not present

## 2020-02-12 DIAGNOSIS — Z992 Dependence on renal dialysis: Secondary | ICD-10-CM | POA: Diagnosis not present

## 2020-02-12 DIAGNOSIS — D631 Anemia in chronic kidney disease: Secondary | ICD-10-CM | POA: Diagnosis not present

## 2020-02-12 DIAGNOSIS — N186 End stage renal disease: Secondary | ICD-10-CM | POA: Diagnosis not present

## 2020-02-12 DIAGNOSIS — N2581 Secondary hyperparathyroidism of renal origin: Secondary | ICD-10-CM | POA: Diagnosis not present

## 2020-02-12 DIAGNOSIS — R509 Fever, unspecified: Secondary | ICD-10-CM | POA: Diagnosis not present

## 2020-02-14 DIAGNOSIS — N186 End stage renal disease: Secondary | ICD-10-CM | POA: Diagnosis not present

## 2020-02-14 DIAGNOSIS — N2581 Secondary hyperparathyroidism of renal origin: Secondary | ICD-10-CM | POA: Diagnosis not present

## 2020-02-14 DIAGNOSIS — R509 Fever, unspecified: Secondary | ICD-10-CM | POA: Diagnosis not present

## 2020-02-14 DIAGNOSIS — D631 Anemia in chronic kidney disease: Secondary | ICD-10-CM | POA: Diagnosis not present

## 2020-02-14 DIAGNOSIS — E875 Hyperkalemia: Secondary | ICD-10-CM | POA: Diagnosis not present

## 2020-02-14 DIAGNOSIS — Z992 Dependence on renal dialysis: Secondary | ICD-10-CM | POA: Diagnosis not present

## 2020-02-15 ENCOUNTER — Other Ambulatory Visit: Payer: Self-pay

## 2020-02-15 DIAGNOSIS — I351 Nonrheumatic aortic (valve) insufficiency: Secondary | ICD-10-CM

## 2020-02-15 DIAGNOSIS — I739 Peripheral vascular disease, unspecified: Secondary | ICD-10-CM

## 2020-02-16 DIAGNOSIS — D631 Anemia in chronic kidney disease: Secondary | ICD-10-CM | POA: Diagnosis not present

## 2020-02-16 DIAGNOSIS — N186 End stage renal disease: Secondary | ICD-10-CM | POA: Diagnosis not present

## 2020-02-16 DIAGNOSIS — N2581 Secondary hyperparathyroidism of renal origin: Secondary | ICD-10-CM | POA: Diagnosis not present

## 2020-02-16 DIAGNOSIS — Z992 Dependence on renal dialysis: Secondary | ICD-10-CM | POA: Diagnosis not present

## 2020-02-16 DIAGNOSIS — E875 Hyperkalemia: Secondary | ICD-10-CM | POA: Diagnosis not present

## 2020-02-16 DIAGNOSIS — R509 Fever, unspecified: Secondary | ICD-10-CM | POA: Diagnosis not present

## 2020-02-19 DIAGNOSIS — Z992 Dependence on renal dialysis: Secondary | ICD-10-CM | POA: Diagnosis not present

## 2020-02-19 DIAGNOSIS — N2581 Secondary hyperparathyroidism of renal origin: Secondary | ICD-10-CM | POA: Diagnosis not present

## 2020-02-19 DIAGNOSIS — E875 Hyperkalemia: Secondary | ICD-10-CM | POA: Diagnosis not present

## 2020-02-19 DIAGNOSIS — R509 Fever, unspecified: Secondary | ICD-10-CM | POA: Diagnosis not present

## 2020-02-19 DIAGNOSIS — D631 Anemia in chronic kidney disease: Secondary | ICD-10-CM | POA: Diagnosis not present

## 2020-02-19 DIAGNOSIS — N186 End stage renal disease: Secondary | ICD-10-CM | POA: Diagnosis not present

## 2020-02-21 DIAGNOSIS — E875 Hyperkalemia: Secondary | ICD-10-CM | POA: Diagnosis not present

## 2020-02-21 DIAGNOSIS — N186 End stage renal disease: Secondary | ICD-10-CM | POA: Diagnosis not present

## 2020-02-21 DIAGNOSIS — R509 Fever, unspecified: Secondary | ICD-10-CM | POA: Diagnosis not present

## 2020-02-21 DIAGNOSIS — Z992 Dependence on renal dialysis: Secondary | ICD-10-CM | POA: Diagnosis not present

## 2020-02-21 DIAGNOSIS — N2581 Secondary hyperparathyroidism of renal origin: Secondary | ICD-10-CM | POA: Diagnosis not present

## 2020-02-21 DIAGNOSIS — D631 Anemia in chronic kidney disease: Secondary | ICD-10-CM | POA: Diagnosis not present

## 2020-02-23 DIAGNOSIS — N186 End stage renal disease: Secondary | ICD-10-CM | POA: Diagnosis not present

## 2020-02-23 DIAGNOSIS — E875 Hyperkalemia: Secondary | ICD-10-CM | POA: Diagnosis not present

## 2020-02-23 DIAGNOSIS — Z992 Dependence on renal dialysis: Secondary | ICD-10-CM | POA: Diagnosis not present

## 2020-02-23 DIAGNOSIS — N2581 Secondary hyperparathyroidism of renal origin: Secondary | ICD-10-CM | POA: Diagnosis not present

## 2020-02-23 DIAGNOSIS — D631 Anemia in chronic kidney disease: Secondary | ICD-10-CM | POA: Diagnosis not present

## 2020-02-23 DIAGNOSIS — R509 Fever, unspecified: Secondary | ICD-10-CM | POA: Diagnosis not present

## 2020-02-26 DIAGNOSIS — E875 Hyperkalemia: Secondary | ICD-10-CM | POA: Diagnosis not present

## 2020-02-26 DIAGNOSIS — N2581 Secondary hyperparathyroidism of renal origin: Secondary | ICD-10-CM | POA: Diagnosis not present

## 2020-02-26 DIAGNOSIS — Z992 Dependence on renal dialysis: Secondary | ICD-10-CM | POA: Diagnosis not present

## 2020-02-26 DIAGNOSIS — N186 End stage renal disease: Secondary | ICD-10-CM | POA: Diagnosis not present

## 2020-02-26 DIAGNOSIS — D631 Anemia in chronic kidney disease: Secondary | ICD-10-CM | POA: Diagnosis not present

## 2020-02-26 DIAGNOSIS — R509 Fever, unspecified: Secondary | ICD-10-CM | POA: Diagnosis not present

## 2020-02-28 DIAGNOSIS — Z992 Dependence on renal dialysis: Secondary | ICD-10-CM | POA: Diagnosis not present

## 2020-02-28 DIAGNOSIS — N2581 Secondary hyperparathyroidism of renal origin: Secondary | ICD-10-CM | POA: Diagnosis not present

## 2020-02-28 DIAGNOSIS — R509 Fever, unspecified: Secondary | ICD-10-CM | POA: Diagnosis not present

## 2020-02-28 DIAGNOSIS — E875 Hyperkalemia: Secondary | ICD-10-CM | POA: Diagnosis not present

## 2020-02-28 DIAGNOSIS — D631 Anemia in chronic kidney disease: Secondary | ICD-10-CM | POA: Diagnosis not present

## 2020-02-28 DIAGNOSIS — N186 End stage renal disease: Secondary | ICD-10-CM | POA: Diagnosis not present

## 2020-02-29 ENCOUNTER — Inpatient Hospital Stay (HOSPITAL_COMMUNITY): Admission: RE | Admit: 2020-02-29 | Payer: Medicare Other | Source: Ambulatory Visit

## 2020-02-29 ENCOUNTER — Encounter (HOSPITAL_COMMUNITY): Payer: Medicare Other

## 2020-02-29 DIAGNOSIS — N186 End stage renal disease: Secondary | ICD-10-CM | POA: Diagnosis not present

## 2020-02-29 DIAGNOSIS — Z992 Dependence on renal dialysis: Secondary | ICD-10-CM | POA: Diagnosis not present

## 2020-02-29 DIAGNOSIS — I120 Hypertensive chronic kidney disease with stage 5 chronic kidney disease or end stage renal disease: Secondary | ICD-10-CM | POA: Diagnosis not present

## 2020-03-01 DIAGNOSIS — K7460 Unspecified cirrhosis of liver: Secondary | ICD-10-CM | POA: Diagnosis not present

## 2020-03-01 DIAGNOSIS — Z992 Dependence on renal dialysis: Secondary | ICD-10-CM | POA: Diagnosis not present

## 2020-03-01 DIAGNOSIS — N2581 Secondary hyperparathyroidism of renal origin: Secondary | ICD-10-CM | POA: Diagnosis not present

## 2020-03-01 DIAGNOSIS — D631 Anemia in chronic kidney disease: Secondary | ICD-10-CM | POA: Diagnosis not present

## 2020-03-01 DIAGNOSIS — N186 End stage renal disease: Secondary | ICD-10-CM | POA: Diagnosis not present

## 2020-03-03 MED FILL — CLOPIDOGREL 75 MG TABLET: 75 | 30 days supply | Qty: 30 | Fill #1

## 2020-03-04 DIAGNOSIS — Z992 Dependence on renal dialysis: Secondary | ICD-10-CM | POA: Diagnosis not present

## 2020-03-04 DIAGNOSIS — D631 Anemia in chronic kidney disease: Secondary | ICD-10-CM | POA: Diagnosis not present

## 2020-03-04 DIAGNOSIS — K7460 Unspecified cirrhosis of liver: Secondary | ICD-10-CM | POA: Diagnosis not present

## 2020-03-04 DIAGNOSIS — N2581 Secondary hyperparathyroidism of renal origin: Secondary | ICD-10-CM | POA: Diagnosis not present

## 2020-03-04 DIAGNOSIS — N186 End stage renal disease: Secondary | ICD-10-CM | POA: Diagnosis not present

## 2020-03-06 DIAGNOSIS — N186 End stage renal disease: Secondary | ICD-10-CM | POA: Diagnosis not present

## 2020-03-06 DIAGNOSIS — D631 Anemia in chronic kidney disease: Secondary | ICD-10-CM | POA: Diagnosis not present

## 2020-03-06 DIAGNOSIS — Z992 Dependence on renal dialysis: Secondary | ICD-10-CM | POA: Diagnosis not present

## 2020-03-06 DIAGNOSIS — N2581 Secondary hyperparathyroidism of renal origin: Secondary | ICD-10-CM | POA: Diagnosis not present

## 2020-03-06 DIAGNOSIS — K7460 Unspecified cirrhosis of liver: Secondary | ICD-10-CM | POA: Diagnosis not present

## 2020-03-08 DIAGNOSIS — Z992 Dependence on renal dialysis: Secondary | ICD-10-CM | POA: Diagnosis not present

## 2020-03-08 DIAGNOSIS — D631 Anemia in chronic kidney disease: Secondary | ICD-10-CM | POA: Diagnosis not present

## 2020-03-08 DIAGNOSIS — N2581 Secondary hyperparathyroidism of renal origin: Secondary | ICD-10-CM | POA: Diagnosis not present

## 2020-03-08 DIAGNOSIS — N186 End stage renal disease: Secondary | ICD-10-CM | POA: Diagnosis not present

## 2020-03-08 DIAGNOSIS — K7460 Unspecified cirrhosis of liver: Secondary | ICD-10-CM | POA: Diagnosis not present

## 2020-03-11 DIAGNOSIS — N186 End stage renal disease: Secondary | ICD-10-CM | POA: Diagnosis not present

## 2020-03-11 DIAGNOSIS — D631 Anemia in chronic kidney disease: Secondary | ICD-10-CM | POA: Diagnosis not present

## 2020-03-11 DIAGNOSIS — K7460 Unspecified cirrhosis of liver: Secondary | ICD-10-CM | POA: Diagnosis not present

## 2020-03-11 DIAGNOSIS — Z992 Dependence on renal dialysis: Secondary | ICD-10-CM | POA: Diagnosis not present

## 2020-03-11 DIAGNOSIS — N2581 Secondary hyperparathyroidism of renal origin: Secondary | ICD-10-CM | POA: Diagnosis not present

## 2020-03-13 DIAGNOSIS — N186 End stage renal disease: Secondary | ICD-10-CM | POA: Diagnosis not present

## 2020-03-13 DIAGNOSIS — D631 Anemia in chronic kidney disease: Secondary | ICD-10-CM | POA: Diagnosis not present

## 2020-03-13 DIAGNOSIS — Z992 Dependence on renal dialysis: Secondary | ICD-10-CM | POA: Diagnosis not present

## 2020-03-13 DIAGNOSIS — K7460 Unspecified cirrhosis of liver: Secondary | ICD-10-CM | POA: Diagnosis not present

## 2020-03-13 DIAGNOSIS — N2581 Secondary hyperparathyroidism of renal origin: Secondary | ICD-10-CM | POA: Diagnosis not present

## 2020-03-15 DIAGNOSIS — N2581 Secondary hyperparathyroidism of renal origin: Secondary | ICD-10-CM | POA: Diagnosis not present

## 2020-03-15 DIAGNOSIS — D631 Anemia in chronic kidney disease: Secondary | ICD-10-CM | POA: Diagnosis not present

## 2020-03-15 DIAGNOSIS — K7460 Unspecified cirrhosis of liver: Secondary | ICD-10-CM | POA: Diagnosis not present

## 2020-03-15 DIAGNOSIS — Z992 Dependence on renal dialysis: Secondary | ICD-10-CM | POA: Diagnosis not present

## 2020-03-15 DIAGNOSIS — N186 End stage renal disease: Secondary | ICD-10-CM | POA: Diagnosis not present

## 2020-03-18 DIAGNOSIS — N186 End stage renal disease: Secondary | ICD-10-CM | POA: Diagnosis not present

## 2020-03-18 DIAGNOSIS — D631 Anemia in chronic kidney disease: Secondary | ICD-10-CM | POA: Diagnosis not present

## 2020-03-18 DIAGNOSIS — N2581 Secondary hyperparathyroidism of renal origin: Secondary | ICD-10-CM | POA: Diagnosis not present

## 2020-03-18 DIAGNOSIS — Z992 Dependence on renal dialysis: Secondary | ICD-10-CM | POA: Diagnosis not present

## 2020-03-18 DIAGNOSIS — K7460 Unspecified cirrhosis of liver: Secondary | ICD-10-CM | POA: Diagnosis not present

## 2020-03-20 DIAGNOSIS — D631 Anemia in chronic kidney disease: Secondary | ICD-10-CM | POA: Diagnosis not present

## 2020-03-20 DIAGNOSIS — N2581 Secondary hyperparathyroidism of renal origin: Secondary | ICD-10-CM | POA: Diagnosis not present

## 2020-03-20 DIAGNOSIS — K7460 Unspecified cirrhosis of liver: Secondary | ICD-10-CM | POA: Diagnosis not present

## 2020-03-20 DIAGNOSIS — Z992 Dependence on renal dialysis: Secondary | ICD-10-CM | POA: Diagnosis not present

## 2020-03-20 DIAGNOSIS — N186 End stage renal disease: Secondary | ICD-10-CM | POA: Diagnosis not present

## 2020-03-22 DIAGNOSIS — Z992 Dependence on renal dialysis: Secondary | ICD-10-CM | POA: Diagnosis not present

## 2020-03-22 DIAGNOSIS — D631 Anemia in chronic kidney disease: Secondary | ICD-10-CM | POA: Diagnosis not present

## 2020-03-22 DIAGNOSIS — N2581 Secondary hyperparathyroidism of renal origin: Secondary | ICD-10-CM | POA: Diagnosis not present

## 2020-03-22 DIAGNOSIS — K7460 Unspecified cirrhosis of liver: Secondary | ICD-10-CM | POA: Diagnosis not present

## 2020-03-22 DIAGNOSIS — N186 End stage renal disease: Secondary | ICD-10-CM | POA: Diagnosis not present

## 2020-03-25 DIAGNOSIS — N2581 Secondary hyperparathyroidism of renal origin: Secondary | ICD-10-CM | POA: Diagnosis not present

## 2020-03-25 DIAGNOSIS — D631 Anemia in chronic kidney disease: Secondary | ICD-10-CM | POA: Diagnosis not present

## 2020-03-25 DIAGNOSIS — N186 End stage renal disease: Secondary | ICD-10-CM | POA: Diagnosis not present

## 2020-03-25 DIAGNOSIS — Z992 Dependence on renal dialysis: Secondary | ICD-10-CM | POA: Diagnosis not present

## 2020-03-25 DIAGNOSIS — K7460 Unspecified cirrhosis of liver: Secondary | ICD-10-CM | POA: Diagnosis not present

## 2020-03-27 DIAGNOSIS — N186 End stage renal disease: Secondary | ICD-10-CM | POA: Diagnosis not present

## 2020-03-27 DIAGNOSIS — D631 Anemia in chronic kidney disease: Secondary | ICD-10-CM | POA: Diagnosis not present

## 2020-03-27 DIAGNOSIS — Z992 Dependence on renal dialysis: Secondary | ICD-10-CM | POA: Diagnosis not present

## 2020-03-27 DIAGNOSIS — N2581 Secondary hyperparathyroidism of renal origin: Secondary | ICD-10-CM | POA: Diagnosis not present

## 2020-03-27 DIAGNOSIS — K7460 Unspecified cirrhosis of liver: Secondary | ICD-10-CM | POA: Diagnosis not present

## 2020-03-31 DIAGNOSIS — Z992 Dependence on renal dialysis: Secondary | ICD-10-CM | POA: Diagnosis not present

## 2020-03-31 DIAGNOSIS — N186 End stage renal disease: Secondary | ICD-10-CM | POA: Diagnosis not present

## 2020-03-31 DIAGNOSIS — I120 Hypertensive chronic kidney disease with stage 5 chronic kidney disease or end stage renal disease: Secondary | ICD-10-CM | POA: Diagnosis not present

## 2020-04-03 DIAGNOSIS — D631 Anemia in chronic kidney disease: Secondary | ICD-10-CM | POA: Diagnosis not present

## 2020-04-03 DIAGNOSIS — Z992 Dependence on renal dialysis: Secondary | ICD-10-CM | POA: Diagnosis not present

## 2020-04-03 DIAGNOSIS — E875 Hyperkalemia: Secondary | ICD-10-CM | POA: Diagnosis not present

## 2020-04-03 DIAGNOSIS — D509 Iron deficiency anemia, unspecified: Secondary | ICD-10-CM | POA: Diagnosis not present

## 2020-04-03 DIAGNOSIS — N2581 Secondary hyperparathyroidism of renal origin: Secondary | ICD-10-CM | POA: Diagnosis not present

## 2020-04-03 DIAGNOSIS — N186 End stage renal disease: Secondary | ICD-10-CM | POA: Diagnosis not present

## 2020-04-05 DIAGNOSIS — Z992 Dependence on renal dialysis: Secondary | ICD-10-CM | POA: Diagnosis not present

## 2020-04-05 DIAGNOSIS — E875 Hyperkalemia: Secondary | ICD-10-CM | POA: Diagnosis not present

## 2020-04-05 DIAGNOSIS — D631 Anemia in chronic kidney disease: Secondary | ICD-10-CM | POA: Diagnosis not present

## 2020-04-05 DIAGNOSIS — D509 Iron deficiency anemia, unspecified: Secondary | ICD-10-CM | POA: Diagnosis not present

## 2020-04-05 DIAGNOSIS — N2581 Secondary hyperparathyroidism of renal origin: Secondary | ICD-10-CM | POA: Diagnosis not present

## 2020-04-05 DIAGNOSIS — N186 End stage renal disease: Secondary | ICD-10-CM | POA: Diagnosis not present

## 2020-04-10 DIAGNOSIS — N186 End stage renal disease: Secondary | ICD-10-CM | POA: Diagnosis not present

## 2020-04-10 DIAGNOSIS — Z992 Dependence on renal dialysis: Secondary | ICD-10-CM | POA: Diagnosis not present

## 2020-04-10 DIAGNOSIS — D509 Iron deficiency anemia, unspecified: Secondary | ICD-10-CM | POA: Diagnosis not present

## 2020-04-10 DIAGNOSIS — N2581 Secondary hyperparathyroidism of renal origin: Secondary | ICD-10-CM | POA: Diagnosis not present

## 2020-04-10 DIAGNOSIS — E875 Hyperkalemia: Secondary | ICD-10-CM | POA: Diagnosis not present

## 2020-04-10 DIAGNOSIS — D631 Anemia in chronic kidney disease: Secondary | ICD-10-CM | POA: Diagnosis not present

## 2020-04-15 DIAGNOSIS — Z992 Dependence on renal dialysis: Secondary | ICD-10-CM | POA: Diagnosis not present

## 2020-04-15 DIAGNOSIS — E875 Hyperkalemia: Secondary | ICD-10-CM | POA: Diagnosis not present

## 2020-04-15 DIAGNOSIS — N186 End stage renal disease: Secondary | ICD-10-CM | POA: Diagnosis not present

## 2020-04-15 DIAGNOSIS — D631 Anemia in chronic kidney disease: Secondary | ICD-10-CM | POA: Diagnosis not present

## 2020-04-15 DIAGNOSIS — D509 Iron deficiency anemia, unspecified: Secondary | ICD-10-CM | POA: Diagnosis not present

## 2020-04-15 DIAGNOSIS — N2581 Secondary hyperparathyroidism of renal origin: Secondary | ICD-10-CM | POA: Diagnosis not present

## 2020-04-17 DIAGNOSIS — N2581 Secondary hyperparathyroidism of renal origin: Secondary | ICD-10-CM | POA: Diagnosis not present

## 2020-04-17 DIAGNOSIS — E875 Hyperkalemia: Secondary | ICD-10-CM | POA: Diagnosis not present

## 2020-04-17 DIAGNOSIS — D509 Iron deficiency anemia, unspecified: Secondary | ICD-10-CM | POA: Diagnosis not present

## 2020-04-17 DIAGNOSIS — N186 End stage renal disease: Secondary | ICD-10-CM | POA: Diagnosis not present

## 2020-04-17 DIAGNOSIS — Z992 Dependence on renal dialysis: Secondary | ICD-10-CM | POA: Diagnosis not present

## 2020-04-17 DIAGNOSIS — D631 Anemia in chronic kidney disease: Secondary | ICD-10-CM | POA: Diagnosis not present

## 2020-04-19 DIAGNOSIS — D509 Iron deficiency anemia, unspecified: Secondary | ICD-10-CM | POA: Diagnosis not present

## 2020-04-19 DIAGNOSIS — D631 Anemia in chronic kidney disease: Secondary | ICD-10-CM | POA: Diagnosis not present

## 2020-04-19 DIAGNOSIS — N2581 Secondary hyperparathyroidism of renal origin: Secondary | ICD-10-CM | POA: Diagnosis not present

## 2020-04-19 DIAGNOSIS — E875 Hyperkalemia: Secondary | ICD-10-CM | POA: Diagnosis not present

## 2020-04-19 DIAGNOSIS — Z992 Dependence on renal dialysis: Secondary | ICD-10-CM | POA: Diagnosis not present

## 2020-04-19 DIAGNOSIS — N186 End stage renal disease: Secondary | ICD-10-CM | POA: Diagnosis not present

## 2020-04-23 DIAGNOSIS — E875 Hyperkalemia: Secondary | ICD-10-CM | POA: Diagnosis not present

## 2020-04-23 DIAGNOSIS — N2581 Secondary hyperparathyroidism of renal origin: Secondary | ICD-10-CM | POA: Diagnosis not present

## 2020-04-23 DIAGNOSIS — D631 Anemia in chronic kidney disease: Secondary | ICD-10-CM | POA: Diagnosis not present

## 2020-04-23 DIAGNOSIS — D509 Iron deficiency anemia, unspecified: Secondary | ICD-10-CM | POA: Diagnosis not present

## 2020-04-23 DIAGNOSIS — Z992 Dependence on renal dialysis: Secondary | ICD-10-CM | POA: Diagnosis not present

## 2020-04-23 DIAGNOSIS — N186 End stage renal disease: Secondary | ICD-10-CM | POA: Diagnosis not present

## 2020-04-26 DIAGNOSIS — E875 Hyperkalemia: Secondary | ICD-10-CM | POA: Diagnosis not present

## 2020-04-26 DIAGNOSIS — D631 Anemia in chronic kidney disease: Secondary | ICD-10-CM | POA: Diagnosis not present

## 2020-04-26 DIAGNOSIS — N186 End stage renal disease: Secondary | ICD-10-CM | POA: Diagnosis not present

## 2020-04-26 DIAGNOSIS — N2581 Secondary hyperparathyroidism of renal origin: Secondary | ICD-10-CM | POA: Diagnosis not present

## 2020-04-26 DIAGNOSIS — D509 Iron deficiency anemia, unspecified: Secondary | ICD-10-CM | POA: Diagnosis not present

## 2020-04-26 DIAGNOSIS — Z992 Dependence on renal dialysis: Secondary | ICD-10-CM | POA: Diagnosis not present

## 2020-04-29 DIAGNOSIS — D509 Iron deficiency anemia, unspecified: Secondary | ICD-10-CM | POA: Diagnosis not present

## 2020-04-29 DIAGNOSIS — N2581 Secondary hyperparathyroidism of renal origin: Secondary | ICD-10-CM | POA: Diagnosis not present

## 2020-04-29 DIAGNOSIS — Z992 Dependence on renal dialysis: Secondary | ICD-10-CM | POA: Diagnosis not present

## 2020-04-29 DIAGNOSIS — E875 Hyperkalemia: Secondary | ICD-10-CM | POA: Diagnosis not present

## 2020-04-29 DIAGNOSIS — N186 End stage renal disease: Secondary | ICD-10-CM | POA: Diagnosis not present

## 2020-04-29 DIAGNOSIS — D631 Anemia in chronic kidney disease: Secondary | ICD-10-CM | POA: Diagnosis not present

## 2020-04-30 DIAGNOSIS — Z992 Dependence on renal dialysis: Secondary | ICD-10-CM | POA: Diagnosis not present

## 2020-04-30 DIAGNOSIS — N186 End stage renal disease: Secondary | ICD-10-CM | POA: Diagnosis not present

## 2020-04-30 DIAGNOSIS — I120 Hypertensive chronic kidney disease with stage 5 chronic kidney disease or end stage renal disease: Secondary | ICD-10-CM | POA: Diagnosis not present

## 2020-05-01 DIAGNOSIS — D509 Iron deficiency anemia, unspecified: Secondary | ICD-10-CM | POA: Diagnosis not present

## 2020-05-01 DIAGNOSIS — Z992 Dependence on renal dialysis: Secondary | ICD-10-CM | POA: Diagnosis not present

## 2020-05-01 DIAGNOSIS — E875 Hyperkalemia: Secondary | ICD-10-CM | POA: Diagnosis not present

## 2020-05-01 DIAGNOSIS — D631 Anemia in chronic kidney disease: Secondary | ICD-10-CM | POA: Diagnosis not present

## 2020-05-01 DIAGNOSIS — N2581 Secondary hyperparathyroidism of renal origin: Secondary | ICD-10-CM | POA: Diagnosis not present

## 2020-05-01 DIAGNOSIS — N186 End stage renal disease: Secondary | ICD-10-CM | POA: Diagnosis not present

## 2020-05-03 DIAGNOSIS — Z992 Dependence on renal dialysis: Secondary | ICD-10-CM | POA: Diagnosis not present

## 2020-05-03 DIAGNOSIS — N186 End stage renal disease: Secondary | ICD-10-CM | POA: Diagnosis not present

## 2020-05-03 DIAGNOSIS — D509 Iron deficiency anemia, unspecified: Secondary | ICD-10-CM | POA: Diagnosis not present

## 2020-05-03 DIAGNOSIS — N2581 Secondary hyperparathyroidism of renal origin: Secondary | ICD-10-CM | POA: Diagnosis not present

## 2020-05-03 DIAGNOSIS — E875 Hyperkalemia: Secondary | ICD-10-CM | POA: Diagnosis not present

## 2020-05-03 DIAGNOSIS — D631 Anemia in chronic kidney disease: Secondary | ICD-10-CM | POA: Diagnosis not present

## 2020-05-06 DIAGNOSIS — E875 Hyperkalemia: Secondary | ICD-10-CM | POA: Diagnosis not present

## 2020-05-06 DIAGNOSIS — Z992 Dependence on renal dialysis: Secondary | ICD-10-CM | POA: Diagnosis not present

## 2020-05-06 DIAGNOSIS — D509 Iron deficiency anemia, unspecified: Secondary | ICD-10-CM | POA: Diagnosis not present

## 2020-05-06 DIAGNOSIS — D631 Anemia in chronic kidney disease: Secondary | ICD-10-CM | POA: Diagnosis not present

## 2020-05-06 DIAGNOSIS — N186 End stage renal disease: Secondary | ICD-10-CM | POA: Diagnosis not present

## 2020-05-06 DIAGNOSIS — N2581 Secondary hyperparathyroidism of renal origin: Secondary | ICD-10-CM | POA: Diagnosis not present

## 2020-05-10 DIAGNOSIS — D509 Iron deficiency anemia, unspecified: Secondary | ICD-10-CM | POA: Diagnosis not present

## 2020-05-10 DIAGNOSIS — N186 End stage renal disease: Secondary | ICD-10-CM | POA: Diagnosis not present

## 2020-05-10 DIAGNOSIS — D631 Anemia in chronic kidney disease: Secondary | ICD-10-CM | POA: Diagnosis not present

## 2020-05-10 DIAGNOSIS — Z992 Dependence on renal dialysis: Secondary | ICD-10-CM | POA: Diagnosis not present

## 2020-05-10 DIAGNOSIS — N2581 Secondary hyperparathyroidism of renal origin: Secondary | ICD-10-CM | POA: Diagnosis not present

## 2020-05-10 DIAGNOSIS — E875 Hyperkalemia: Secondary | ICD-10-CM | POA: Diagnosis not present

## 2020-05-19 ENCOUNTER — Other Ambulatory Visit: Payer: Self-pay

## 2020-05-19 ENCOUNTER — Emergency Department (HOSPITAL_COMMUNITY): Payer: Medicare Other

## 2020-05-19 ENCOUNTER — Encounter (HOSPITAL_COMMUNITY): Payer: Self-pay | Admitting: Emergency Medicine

## 2020-05-19 ENCOUNTER — Inpatient Hospital Stay (HOSPITAL_COMMUNITY)
Admission: EM | Admit: 2020-05-19 | Discharge: 2020-05-30 | DRG: 377 | Disposition: A | Discharge status: Hospice | Payer: Medicare Other | Attending: Internal Medicine | Admitting: Internal Medicine

## 2020-05-19 DIAGNOSIS — I5032 Chronic diastolic (congestive) heart failure: Secondary | ICD-10-CM | POA: Diagnosis present

## 2020-05-19 DIAGNOSIS — R579 Shock, unspecified: Secondary | ICD-10-CM | POA: Diagnosis not present

## 2020-05-19 DIAGNOSIS — I739 Peripheral vascular disease, unspecified: Secondary | ICD-10-CM | POA: Diagnosis present

## 2020-05-19 DIAGNOSIS — E43 Unspecified severe protein-calorie malnutrition: Secondary | ICD-10-CM | POA: Insufficient documentation

## 2020-05-19 DIAGNOSIS — Z515 Encounter for palliative care: Secondary | ICD-10-CM

## 2020-05-19 DIAGNOSIS — Z20822 Contact with and (suspected) exposure to covid-19: Secondary | ICD-10-CM | POA: Diagnosis not present

## 2020-05-19 DIAGNOSIS — Z888 Allergy status to other drugs, medicaments and biological substances status: Secondary | ICD-10-CM

## 2020-05-19 DIAGNOSIS — D62 Acute posthemorrhagic anemia: Secondary | ICD-10-CM | POA: Diagnosis present

## 2020-05-19 DIAGNOSIS — K5710 Diverticulosis of small intestine without perforation or abscess without bleeding: Secondary | ICD-10-CM | POA: Diagnosis present

## 2020-05-19 DIAGNOSIS — Z8619 Personal history of other infectious and parasitic diseases: Secondary | ICD-10-CM

## 2020-05-19 DIAGNOSIS — K922 Gastrointestinal hemorrhage, unspecified: Secondary | ICD-10-CM | POA: Diagnosis not present

## 2020-05-19 DIAGNOSIS — Z720 Tobacco use: Secondary | ICD-10-CM | POA: Diagnosis present

## 2020-05-19 DIAGNOSIS — E8889 Other specified metabolic disorders: Secondary | ICD-10-CM | POA: Diagnosis present

## 2020-05-19 DIAGNOSIS — E871 Hypo-osmolality and hyponatremia: Secondary | ICD-10-CM | POA: Diagnosis not present

## 2020-05-19 DIAGNOSIS — N186 End stage renal disease: Secondary | ICD-10-CM | POA: Diagnosis not present

## 2020-05-19 DIAGNOSIS — Z91138 Patient's unintentional underdosing of medication regimen for other reason: Secondary | ICD-10-CM

## 2020-05-19 DIAGNOSIS — Z66 Do not resuscitate: Secondary | ICD-10-CM | POA: Diagnosis not present

## 2020-05-19 DIAGNOSIS — E161 Other hypoglycemia: Secondary | ICD-10-CM | POA: Diagnosis not present

## 2020-05-19 DIAGNOSIS — F17210 Nicotine dependence, cigarettes, uncomplicated: Secondary | ICD-10-CM | POA: Diagnosis present

## 2020-05-19 DIAGNOSIS — R578 Other shock: Secondary | ICD-10-CM | POA: Diagnosis not present

## 2020-05-19 DIAGNOSIS — T471X6A Underdosing of other antacids and anti-gastric-secretion drugs, initial encounter: Secondary | ICD-10-CM | POA: Diagnosis present

## 2020-05-19 DIAGNOSIS — I2722 Pulmonary hypertension due to left heart disease: Secondary | ICD-10-CM | POA: Diagnosis present

## 2020-05-19 DIAGNOSIS — K7460 Unspecified cirrhosis of liver: Secondary | ICD-10-CM | POA: Diagnosis present

## 2020-05-19 DIAGNOSIS — E872 Acidosis: Secondary | ICD-10-CM | POA: Diagnosis present

## 2020-05-19 DIAGNOSIS — K760 Fatty (change of) liver, not elsewhere classified: Secondary | ICD-10-CM | POA: Diagnosis present

## 2020-05-19 DIAGNOSIS — D689 Coagulation defect, unspecified: Secondary | ICD-10-CM | POA: Diagnosis present

## 2020-05-19 DIAGNOSIS — L89131 Pressure ulcer of right lower back, stage 1: Secondary | ICD-10-CM | POA: Diagnosis not present

## 2020-05-19 DIAGNOSIS — Z992 Dependence on renal dialysis: Secondary | ICD-10-CM | POA: Diagnosis not present

## 2020-05-19 DIAGNOSIS — I700 Atherosclerosis of aorta: Secondary | ICD-10-CM | POA: Diagnosis not present

## 2020-05-19 DIAGNOSIS — D649 Anemia, unspecified: Secondary | ICD-10-CM | POA: Diagnosis not present

## 2020-05-19 DIAGNOSIS — Z90710 Acquired absence of both cervix and uterus: Secondary | ICD-10-CM

## 2020-05-19 DIAGNOSIS — K219 Gastro-esophageal reflux disease without esophagitis: Secondary | ICD-10-CM | POA: Diagnosis not present

## 2020-05-19 DIAGNOSIS — I132 Hypertensive heart and chronic kidney disease with heart failure and with stage 5 chronic kidney disease, or end stage renal disease: Secondary | ICD-10-CM | POA: Diagnosis not present

## 2020-05-19 DIAGNOSIS — L89899 Pressure ulcer of other site, unspecified stage: Secondary | ICD-10-CM | POA: Diagnosis not present

## 2020-05-19 DIAGNOSIS — I4821 Permanent atrial fibrillation: Secondary | ICD-10-CM | POA: Diagnosis present

## 2020-05-19 DIAGNOSIS — Z8249 Family history of ischemic heart disease and other diseases of the circulatory system: Secondary | ICD-10-CM

## 2020-05-19 DIAGNOSIS — K449 Diaphragmatic hernia without obstruction or gangrene: Secondary | ICD-10-CM | POA: Diagnosis present

## 2020-05-19 DIAGNOSIS — D631 Anemia in chronic kidney disease: Secondary | ICD-10-CM | POA: Diagnosis present

## 2020-05-19 DIAGNOSIS — A0472 Enterocolitis due to Clostridium difficile, not specified as recurrent: Secondary | ICD-10-CM | POA: Diagnosis present

## 2020-05-19 DIAGNOSIS — G9349 Other encephalopathy: Secondary | ICD-10-CM | POA: Diagnosis not present

## 2020-05-19 DIAGNOSIS — E162 Hypoglycemia, unspecified: Secondary | ICD-10-CM | POA: Diagnosis present

## 2020-05-19 DIAGNOSIS — L89211 Pressure ulcer of right hip, stage 1: Secondary | ICD-10-CM | POA: Diagnosis not present

## 2020-05-19 DIAGNOSIS — R109 Unspecified abdominal pain: Secondary | ICD-10-CM | POA: Diagnosis present

## 2020-05-19 DIAGNOSIS — Z79899 Other long term (current) drug therapy: Secondary | ICD-10-CM

## 2020-05-19 DIAGNOSIS — Z832 Family history of diseases of the blood and blood-forming organs and certain disorders involving the immune mechanism: Secondary | ICD-10-CM

## 2020-05-19 DIAGNOSIS — K2980 Duodenitis without bleeding: Secondary | ICD-10-CM | POA: Diagnosis present

## 2020-05-19 DIAGNOSIS — Z681 Body mass index (BMI) 19 or less, adult: Secondary | ICD-10-CM

## 2020-05-19 DIAGNOSIS — F419 Anxiety disorder, unspecified: Secondary | ICD-10-CM | POA: Diagnosis present

## 2020-05-19 DIAGNOSIS — E875 Hyperkalemia: Secondary | ICD-10-CM | POA: Diagnosis present

## 2020-05-19 DIAGNOSIS — R64 Cachexia: Secondary | ICD-10-CM | POA: Diagnosis present

## 2020-05-19 DIAGNOSIS — Z881 Allergy status to other antibiotic agents status: Secondary | ICD-10-CM

## 2020-05-19 DIAGNOSIS — R627 Adult failure to thrive: Secondary | ICD-10-CM | POA: Diagnosis present

## 2020-05-19 DIAGNOSIS — D696 Thrombocytopenia, unspecified: Secondary | ICD-10-CM | POA: Diagnosis not present

## 2020-05-19 DIAGNOSIS — Z9115 Patient's noncompliance with renal dialysis: Secondary | ICD-10-CM

## 2020-05-19 DIAGNOSIS — Z8719 Personal history of other diseases of the digestive system: Secondary | ICD-10-CM

## 2020-05-19 DIAGNOSIS — Z833 Family history of diabetes mellitus: Secondary | ICD-10-CM

## 2020-05-19 DIAGNOSIS — L8990 Pressure ulcer of unspecified site, unspecified stage: Secondary | ICD-10-CM | POA: Insufficient documentation

## 2020-05-19 DIAGNOSIS — E861 Hypovolemia: Secondary | ICD-10-CM | POA: Diagnosis present

## 2020-05-19 DIAGNOSIS — R188 Other ascites: Secondary | ICD-10-CM | POA: Diagnosis present

## 2020-05-19 DIAGNOSIS — R000 Tachycardia, unspecified: Secondary | ICD-10-CM | POA: Diagnosis not present

## 2020-05-19 DIAGNOSIS — I351 Nonrheumatic aortic (valve) insufficiency: Secondary | ICD-10-CM | POA: Diagnosis present

## 2020-05-19 DIAGNOSIS — N2581 Secondary hyperparathyroidism of renal origin: Secondary | ICD-10-CM | POA: Diagnosis present

## 2020-05-19 DIAGNOSIS — J45909 Unspecified asthma, uncomplicated: Secondary | ICD-10-CM | POA: Diagnosis present

## 2020-05-19 DIAGNOSIS — E876 Hypokalemia: Secondary | ICD-10-CM | POA: Diagnosis not present

## 2020-05-19 DIAGNOSIS — Z8711 Personal history of peptic ulcer disease: Secondary | ICD-10-CM

## 2020-05-19 DIAGNOSIS — R069 Unspecified abnormalities of breathing: Secondary | ICD-10-CM

## 2020-05-19 DIAGNOSIS — K2970 Gastritis, unspecified, without bleeding: Secondary | ICD-10-CM | POA: Diagnosis present

## 2020-05-19 DIAGNOSIS — K222 Esophageal obstruction: Secondary | ICD-10-CM | POA: Diagnosis present

## 2020-05-19 DIAGNOSIS — F0390 Unspecified dementia without behavioral disturbance: Secondary | ICD-10-CM | POA: Diagnosis present

## 2020-05-19 DIAGNOSIS — I5030 Unspecified diastolic (congestive) heart failure: Secondary | ICD-10-CM | POA: Diagnosis not present

## 2020-05-19 DIAGNOSIS — R58 Hemorrhage, not elsewhere classified: Secondary | ICD-10-CM | POA: Diagnosis not present

## 2020-05-19 DIAGNOSIS — K2990 Gastroduodenitis, unspecified, without bleeding: Secondary | ICD-10-CM | POA: Diagnosis present

## 2020-05-19 DIAGNOSIS — I959 Hypotension, unspecified: Secondary | ICD-10-CM | POA: Diagnosis not present

## 2020-05-19 DIAGNOSIS — Z7902 Long term (current) use of antithrombotics/antiplatelets: Secondary | ICD-10-CM

## 2020-05-19 HISTORY — DX: Sepsis due to unspecified Staphylococcus: A41.2

## 2020-05-19 LAB — CBC WITH DIFFERENTIAL/PLATELET
Abs Immature Granulocytes: 0.05 10*3/uL (ref 0.00–0.07)
Basophils Absolute: 0 10*3/uL (ref 0.0–0.1)
Basophils Relative: 0 %
Eosinophils Absolute: 0 10*3/uL (ref 0.0–0.5)
Eosinophils Relative: 0 %
HCT: 22.8 % — ABNORMAL LOW (ref 36.0–46.0)
Hemoglobin: 7.6 g/dL — ABNORMAL LOW (ref 12.0–15.0)
Immature Granulocytes: 1 %
Lymphocytes Relative: 16 %
Lymphs Abs: 1.3 10*3/uL (ref 0.7–4.0)
MCH: 27.7 pg (ref 26.0–34.0)
MCHC: 33.3 g/dL (ref 30.0–36.0)
MCV: 83.2 fL (ref 80.0–100.0)
Monocytes Absolute: 0.5 10*3/uL (ref 0.1–1.0)
Monocytes Relative: 6 %
Neutro Abs: 6.3 10*3/uL (ref 1.7–7.7)
Neutrophils Relative %: 77 %
Platelets: 87 10*3/uL — ABNORMAL LOW (ref 150–400)
RBC: 2.74 MIL/uL — ABNORMAL LOW (ref 3.87–5.11)
RDW: 16.5 % — ABNORMAL HIGH (ref 11.5–15.5)
WBC: 8.1 10*3/uL (ref 4.0–10.5)
nRBC: 0 % (ref 0.0–0.2)

## 2020-05-19 LAB — MAGNESIUM: Magnesium: 1.9 mg/dL (ref 1.7–2.4)

## 2020-05-19 LAB — CBG MONITORING, ED
Glucose-Capillary: 50 mg/dL — ABNORMAL LOW (ref 70–99)
Glucose-Capillary: 50 mg/dL — ABNORMAL LOW (ref 70–99)
Glucose-Capillary: 100 mg/dL — ABNORMAL HIGH (ref 70–99)
Glucose-Capillary: 80 mg/dL (ref 70–99)
Glucose-Capillary: 74 mg/dL (ref 70–99)

## 2020-05-19 LAB — COMPREHENSIVE METABOLIC PANEL
ALT: 14 U/L (ref 0–44)
AST: 28 U/L (ref 15–41)
Albumin: 1.5 g/dL — ABNORMAL LOW (ref 3.5–5.0)
Alkaline Phosphatase: 57 U/L (ref 38–126)
Anion gap: 19 — ABNORMAL HIGH (ref 5–15)
BUN: 31 mg/dL — ABNORMAL HIGH (ref 6–20)
CO2: 13 mmol/L — ABNORMAL LOW (ref 22–32)
Calcium: 6.7 mg/dL — ABNORMAL LOW (ref 8.9–10.3)
Chloride: 109 mmol/L (ref 98–111)
Creatinine, Ser: 9.69 mg/dL — ABNORMAL HIGH (ref 0.44–1.00)
GFR, Estimated: 4 mL/min — ABNORMAL LOW (ref >60)
Glucose, Bld: 53 mg/dL — ABNORMAL LOW (ref 70–99)
Potassium: 2.4 mmol/L — CL (ref 3.5–5.1)
Sodium: 141 mmol/L (ref 135–145)
Total Bilirubin: 1 mg/dL (ref 0.3–1.2)
Total Protein: 3.9 g/dL — ABNORMAL LOW (ref 6.5–8.1)

## 2020-05-19 LAB — RESP PANEL BY RT-PCR (FLU A&B, COVID) ARPGX2
Influenza A by PCR: NEGATIVE
Influenza B by PCR: NEGATIVE
SARS Coronavirus 2 by RT PCR: NEGATIVE

## 2020-05-19 LAB — C DIFFICILE QUICK SCREEN W PCR REFLEX
C Diff antigen: POSITIVE — AB
C Diff toxin: NEGATIVE

## 2020-05-19 LAB — PHOSPHORUS: Phosphorus: 7.2 mg/dL — ABNORMAL HIGH (ref 2.5–4.6)

## 2020-05-19 LAB — PROTIME-INR
INR: 1.7 — ABNORMAL HIGH (ref 0.8–1.2)
Prothrombin Time: 19.6 seconds — ABNORMAL HIGH (ref 11.4–15.2)

## 2020-05-19 LAB — POTASSIUM: Potassium: 2.1 mmol/L — CL (ref 3.5–5.1)

## 2020-05-19 LAB — PREPARE RBC (CROSSMATCH)

## 2020-05-19 LAB — POC OCCULT BLOOD, ED: Fecal Occult Bld: POSITIVE — AB

## 2020-05-19 LAB — CLOSTRIDIUM DIFFICILE BY PCR, REFLEXED: Toxigenic C. Difficile by PCR: NEGATIVE

## 2020-05-19 MED ORDER — DOXERCALCIFEROL 4 MCG/2ML IV SOLN
2.0000 ug | INTRAVENOUS | Status: DC
  Administered 2020-05-22: 2 ug via INTRAVENOUS
  Filled 2020-05-19 (×4): qty 2

## 2020-05-19 MED ORDER — SODIUM CHLORIDE 0.9 % IV BOLUS
500.0000 mL | Freq: Once | INTRAVENOUS | Status: AC
  Administered 2020-05-19: 500 mL via INTRAVENOUS

## 2020-05-19 MED ORDER — POTASSIUM CHLORIDE 10 MEQ/100ML IV SOLN
10.0000 meq | Freq: Once | INTRAVENOUS | Status: AC
  Administered 2020-05-19: 10 meq via INTRAVENOUS
  Filled 2020-05-19: qty 100

## 2020-05-19 MED ORDER — PANTOPRAZOLE SODIUM 40 MG IV SOLR
8.0000 mg/h | INTRAVENOUS | Status: DC
  Administered 2020-05-19: 12:00:00 8 mg/h via INTRAVENOUS
  Filled 2020-05-19 (×3): qty 80

## 2020-05-19 MED ORDER — DEXTROSE 50 % IV SOLN
50.0000 mL | Freq: Once | INTRAVENOUS | Status: AC
  Administered 2020-05-19: 50 mL via INTRAVENOUS
  Filled 2020-05-19: qty 50

## 2020-05-19 MED ORDER — SODIUM CHLORIDE 0.9 % IV SOLN
10.0000 mL/h | Freq: Once | INTRAVENOUS | Status: AC
  Administered 2020-05-19: 10 mL/h via INTRAVENOUS

## 2020-05-19 MED ORDER — SODIUM CHLORIDE 0.9 % IV SOLN
80.0000 mg | Freq: Once | INTRAVENOUS | Status: AC
  Administered 2020-05-19: 12:00:00 80 mg via INTRAVENOUS
  Filled 2020-05-19: qty 80

## 2020-05-19 MED ORDER — POTASSIUM CHLORIDE CRYS ER 20 MEQ PO TBCR
40.0000 meq | EXTENDED_RELEASE_TABLET | Freq: Once | ORAL | Status: AC
  Administered 2020-05-19: 40 meq via ORAL
  Filled 2020-05-19: qty 2

## 2020-05-19 MED ORDER — CHLORHEXIDINE GLUCONATE CLOTH 2 % EX PADS
6.0000 | MEDICATED_PAD | Freq: Every day | CUTANEOUS | Status: DC
  Administered 2020-05-21 – 2020-05-23 (×3): 6 via TOPICAL

## 2020-05-19 MED ORDER — THIAMINE HCL 100 MG/ML IJ SOLN
100.0000 mg | Freq: Every day | INTRAMUSCULAR | Status: DC
  Administered 2020-05-20 – 2020-05-26 (×7): 100 mg via INTRAVENOUS
  Filled 2020-05-19 (×8): qty 2

## 2020-05-19 MED ORDER — FERRIC CITRATE 1 GM 210 MG(FE) PO TABS
420.0000 mg | ORAL_TABLET | Freq: Three times a day (TID) | ORAL | Status: DC
  Administered 2020-05-20 – 2020-05-22 (×2): 420 mg via ORAL
  Filled 2020-05-19 (×7): qty 2

## 2020-05-19 MED ORDER — CALCIUM GLUCONATE-NACL 1-0.675 GM/50ML-% IV SOLN
1.0000 g | Freq: Once | INTRAVENOUS | Status: AC
  Administered 2020-05-20: 1000 mg via INTRAVENOUS
  Filled 2020-05-19: qty 50

## 2020-05-19 MED ORDER — LACTATED RINGERS IV BOLUS
500.0000 mL | Freq: Once | INTRAVENOUS | Status: AC
  Administered 2020-05-19: 500 mL via INTRAVENOUS

## 2020-05-19 MED ORDER — CINACALCET HCL 30 MG PO TABS
30.0000 mg | ORAL_TABLET | Freq: Every day | ORAL | Status: DC
  Administered 2020-05-20 – 2020-05-27 (×4): 30 mg via ORAL
  Filled 2020-05-19 (×4): qty 1

## 2020-05-19 NOTE — ED Notes (Signed)
Pt's husband would like to speak to attending to clarify details of what patient reported. All details that husband reported thus far to tech have been consistent with what pt reported this morning. Husband phone # in chart.

## 2020-05-19 NOTE — ED Notes (Signed)
EDP made aware of low CBG. Pt provided with Orange Juice per Hypoglycemia Protocol.

## 2020-05-19 NOTE — Consult Note (Deleted)
Karen Morrow XHB:[id number] DOB: 04/10/1961 DOA: 05/19/2020    PCP: [name], MD   Outpatient Specialists:    NEphrology: Dr. Hollie Salk   GI Dr. Ardis Hughs (  [facility])   Patient arrived to ER on 05/19/20 at 1030 Referred by Attending Sherwood Gambler, MD   Patient coming from: home Lives With family    Chief Complaint:   Chief Complaint  Patient presents with  . Rectal Bleeding    HPI: Karen Morrow is a 59 y.o. female with medical history significant of ESRD, HTN, anemia of chronic disease, PAD on Plavix, hx of Upper gi bleed, chronic diastolic CHF, asthma, atrial fibrillation not on anticoagulation secondary to recurrent bleeding, GERD, hepatitis C antibody positive, aortic insufficiency, history of pulmonary hypertension due to left ventricular diastolic dysfunction, tobacco abuse    Presented with  1 wk hx of GI bleed, have been soaking through sanitary pads. Due to bleeding has not been going to her HD last week.  She soaked up to 60 sanitary napkins with bloody stool. States she no longer on PLAVIX   Last admission in May 2021 showed gastric ulcer and duodenitis  patient on PPI  Infectious risk factors:  Reports severe fatigue    Has  been vaccinated against COVID    Initial COVID TEST  NEGATIVE   Lab Results  Component Value Date   [place] 05/19/2020   [place] NEGATIVE 01/18/2020   [place] NEGATIVE 10/13/2019   [place] NEGATIVE 08/01/2019     Regarding pertinent Chronic problems:    Hyperlipidemia - on statins Crestor Lipid Panel     Component Value Date/Time   CHOL 120 06/17/2017 1617   TRIG 91 06/17/2017 1617   HDL 50 06/17/2017 1617   CHOLHDL 2.4 06/17/2017 1617   CHOLHDL 2.2 12/11/2014 1004   VLDL 24 12/11/2014 1004   LDLCALC 52 06/17/2017 1617   LABVLDL 18 06/17/2017 1617     HTN on metoprolol   chronic CHF diastolic  - last echo Grade II diastolic  dysfunction (pseudonormalization).     Asthma -well  on  home inhalers                            A. Fib -  - CHA2DS2 vas score   3     Not on anticoagulation secondary to   recurrent bleeding         -  Rate control:  Currently controlled with  Metoprolol   ESRD   Lab Results  Component Value Date   CREATININE 9.69 (H) 05/19/2020   CREATININE 4.30 (H) 01/21/2020   CREATININE 6.34 (H) 10/15/2019   Chronic anemia - baseline hg Hemoglobin & Hematocrit  Recent Labs    10/16/19 0539 01/21/20 0747 05/19/20 1655  HGB 9.7* 11.6* 7.6*    While in ER: initially hypotensive down to 89/64 Given 500 ml bolus Ordered 1 unit of blood Noted to be showing evidence of hyperkalemia ordered replacement  Noted to be hypoglycemia down to 50's and was given 1 amp of glucose  C.diff toxin negative but c.dif antigen positive  ER Provider Called: [facility]   Dr. Fuller Plan They Recommend admit to medicine   Will see in AM    ER Provider Called: Nephrology Dr.   Deborha Payment Recommend admit to medicine   Will see in AM   PCCM team -at this point requesting medicine to admit as patient likely would improve with IV  fluid and blood transfusions  Hospitalist was called for admission for upper Gi bleed  Patient continued to have unstable BP PCCM to admit to ICU overnight  The following Work up has been ordered so far:  Orders Placed This Encounter  Procedures  . Resp Panel by RT-PCR (Flu A&B, Covid) Nasopharyngeal Swab  . C Difficile Quick Screen w PCR reflex  . Gastrointestinal Panel by PCR , Stool  . C. Diff by PCR, Reflexed  . DG Chest Port 1 View  . Protime-INR  . CBC with Differential/Platelet  . Comprehensive metabolic panel  . Potassium  . CBC with Differential  . Magnesium  . Phosphorus  . Diet NPO time specified  . Place X2 Large Bore IV's  . Initiate Carrier Fluid Protocol  . Refer to nursing sidebar and the Clinical Skills Nursing Procedures web link for further information on insertion, maintenance, and discontinuation criteria of the  Indwelling Fecal Containment Device.  . Insert indwelling fecal management system  . Re-check Vital Signs  . Informed Consent Details: Physician/Practitioner Attestation; Transcribe to consent form and obtain patient signature  . Consult to nephrology  ALL PATIENTS BEING ADMITTED/HAVING PROCEDURES NEED COVID-19 SCREENING  . Consult to gastroenterology  ALL PATIENTS BEING ADMITTED/HAVING PROCEDURES NEED COVID-19 SCREENING  . Consult to hospitalist  ALL PATIENTS BEING ADMITTED/HAVING PROCEDURES NEED COVID-19 SCREENING  . Enteric precautions (UV disinfection) C difficile, Norovirus  . POC occult blood, ED  . I-Stat beta hCG blood, ED  . CBG monitoring, ED  . CBG monitoring, ED  . ED EKG  . EKG 12-Lead  . Type and screen  . Prepare RBC (crossmatch)    Following Medications were ordered in ER: Medications  pantoprazole (PROTONIX) 80 mg in sodium chloride 0.9 % 100 mL (0.8 mg/mL) infusion (8 mg/hr Intravenous Infusion Verify 05/19/20 1411)  potassium chloride SA (KLOR-CON) CR tablet 40 mEq (has no administration in time range)  potassium chloride 10 mEq in 100 mL IVPB (10 mEq Intravenous New Bag/Given 05/19/20 1811)  0.9 %  sodium chloride infusion (has no administration in time range)  sodium chloride 0.9 % bolus 500 mL (0 mLs Intravenous Stopped 05/19/20 1411)  pantoprazole (PROTONIX) 80 mg in sodium chloride 0.9 % 100 mL IVPB (0 mg Intravenous Stopped 05/19/20 1349)  dextrose 50 % solution 50 mL (50 mLs Intravenous Given 05/19/20 1741)  sodium chloride 0.9 % bolus 500 mL (500 mLs Intravenous New Bag/Given 05/19/20 1810)        Consult Orders  (From admission, onward)         Start     Ordered   05/19/20 1818  Consult to hospitalist  ALL PATIENTS BEING ADMITTED/HAVING PROCEDURES NEED COVID-19 SCREENING Pg sent by deloris  Once       Comments: ALL PATIENTS BEING ADMITTED/HAVING PROCEDURES NEED COVID-19 SCREENING  Provider:  (Not yet assigned)  Question Answer Comment  Place call  to: [facility]   Reason for Consult Admit      05/19/20 1817          Significant initial  Findings: Abnormal Labs Reviewed  C DIFFICILE QUICK SCREEN W PCR REFLEX - Abnormal; Notable for the following components:      Result Value   C Diff antigen POSITIVE (*)    All other components within normal limits  PROTIME-INR - Abnormal; Notable for the following components:   Prothrombin Time 19.6 (*)    INR 1.7 (*)    All other components within normal limits  COMPREHENSIVE METABOLIC  PANEL - Abnormal; Notable for the following components:   Potassium 2.4 (*)    CO2 13 (*)    Glucose, Bld 53 (*)    BUN 31 (*)    Creatinine, Ser 9.69 (*)    Calcium 6.7 (*)    Total Protein 3.9 (*)    Albumin 1.5 (*)    GFR, Estimated 4 (*)    Anion gap 19 (*)    All other components within normal limits  POTASSIUM - Abnormal; Notable for the following components:   Potassium 2.1 (*)    All other components within normal limits  CBC WITH DIFFERENTIAL/PLATELET - Abnormal; Notable for the following components:   RBC 2.74 (*)    Hemoglobin 7.6 (*)    HCT 22.8 (*)    RDW 16.5 (*)    Platelets 87 (*)    All other components within normal limits  PHOSPHORUS - Abnormal; Notable for the following components:   Phosphorus 7.2 (*)    All other components within normal limits  POC OCCULT BLOOD, ED - Abnormal; Notable for the following components:   Fecal Occult Bld POSITIVE (*)    All other components within normal limits  CBG MONITORING, ED - Abnormal; Notable for the following components:   Glucose-Capillary 50 (*)    All other components within normal limits  CBG MONITORING, ED - Abnormal; Notable for the following components:   Glucose-Capillary 50 (*)    All other components within normal limits  CBG MONITORING, ED - Abnormal; Notable for the following components:   Glucose-Capillary 100 (*)    All other components within normal limits    Otherwise labs showing:   Recent Labs  Lab  05/19/20 1336 05/19/20 1655 05/19/20 1748  NA 141  --   --   K 2.4* 2.1*  --   CO2 13*  --   --   GLUCOSE 53*  --   --   BUN 31*  --   --   CREATININE 9.69*  --   --   CALCIUM 6.7*  --   --   MG  --   --  1.9  PHOS  --   --  7.2*    Cr    Up from baseline see below Lab Results  Component Value Date   CREATININE 9.69 (H) 05/19/2020   CREATININE 4.30 (H) 01/21/2020   CREATININE 6.34 (H) 10/15/2019    Recent Labs  Lab 05/19/20 1336  AST 28  ALT 14  ALKPHOS 57  BILITOT 1.0  PROT 3.9*  ALBUMIN 1.5*   Lab Results  Component Value Date   CALCIUM 6.7 (L) 05/19/2020   PHOS 7.2 (H) 05/19/2020   WBC       Component Value Date/Time   WBC 8.1 05/19/2020 1655   LYMPHSABS 1.3 05/19/2020 1655   MONOABS 0.5 05/19/2020 1655   EOSABS 0.0 05/19/2020 1655   BASOSABS 0.0 05/19/2020 1655    Plt: Lab Results  Component Value Date   PLT 87 (L) 05/19/2020    Lactic Acid, Venous    Component Value Date/Time   LATICACIDVEN 1.5 08/01/2019 1930    COVID-19 Labs  No results for input(s): DDIMER, FERRITIN, LDH, CRP in the last 72 hours.  Lab Results  Component Value Date   SARSCOV2NAA NEGATIVE 05/19/2020   SARSCOV2NAA NEGATIVE 01/18/2020   SARSCOV2NAA NEGATIVE 10/13/2019   [place] NEGATIVE 08/01/2019     HG/HCT    Down   from baseline see below  Component Value Date/Time   HGB 7.6 (L) 05/19/2020 1655   HGB 11.0 (L) 10/15/2016 1211   HCT 22.8 (L) 05/19/2020 1655   HCT 32.1 (L) 10/15/2016 1211   MCV 83.2 05/19/2020 1655   MCV 92 10/15/2016 1211      ECG: Ordered Personally reviewed by me showing: HR : 91 Rhythm:  NSR   nonspecific changes LVH QTC 461    CBG (last 3)  Recent Labs    05/19/20 1730 05/19/20 1929 05/19/20 2050  GLUCAP 50* 100* 80     CXR - NON acute    ED Triage Vitals  Enc Vitals Group     BP 05/19/20 1040 (!) 88/71     Pulse Rate 05/19/20 1040 100     Resp 05/19/20 1040 18     Temp 05/19/20 1040 97.8 F (36.6 C)     Temp Source  05/19/20 1040 Oral     SpO2 05/19/20 1040 100 %     Weight 05/19/20 1034 158 lb (71.7 kg)     Height 05/19/20 1034 5\' 7"  (1.702 m)     Head Circumference --      Peak Flow --      Pain Score 05/19/20 1034 0     Pain Loc --      Pain Edu? --      Excl. in [place]? --   TMAX(24)@       Latest  Blood pressure (!) 77/56, pulse (!) 107, temperature 97.7 F (36.5 C), temperature source Oral, resp. rate 19, height 5\' 7"  (1.702 m), weight 71.7 kg, SpO2 98 %.   Review of Systems:    Pertinent positives include:  fatigue, abdominal pain melena,   Constitutional:  No weight loss, night sweats, Fevers, chills, weight loss  HEENT:  No headaches, Difficulty swallowing,Tooth/dental problems,Sore throat,  No sneezing, itching, ear ache, nasal congestion, post nasal drip,  Cardio-vascular:  No chest pain, Orthopnea, PND, anasarca, dizziness, palpitations.no Bilateral lower extremity swelling  GI:  No heartburn, indigestion,, nausea, vomiting, diarrhea, change in bowel habits, loss of appetite, blood in stool, hematemesis Resp:  no shortness of breath at rest. No dyspnea on exertion, No excess mucus, no productive cough, No non-productive cough, No coughing up of blood.No change in color of mucus. No wheezing. Skin:  no rash or lesions. No jaundice GU:  no dysuria, change in color of urine, no urgency or frequency. No straining to urinate.  No flank pain.  Musculoskeletal:  No joint pain or no joint swelling. No decreased range of motion. No back pain.  Psych:  No change in mood or affect. No depression or anxiety. No memory loss.  Neuro: no localizing neurological complaints, no tingling, no weakness, no double vision, no gait abnormality, no slurred speech, no confusion  All systems reviewed and apart from [place] all are negative  Past Medical History:   Past Medical History:  Diagnosis Date  . Anginal pain ([place])   . Asthma   . Atrial fibrillation ([place])    not on AC  . CHF (congestive  heart failure) ([place])   . Chronic kidney disease    dialysis 3x wk  . Dyspnea   . Frequent bowel movements   . GERD (gastroesophageal reflux disease)   . Hepatitis C antibody test positive   . Hypertension    "just dx'd today" (11/03/2012)      Past Surgical History:  Procedure Laterality Date  . ABDOMINAL AORTOGRAM W/LOWER EXTREMITY Bilateral 01/21/2020   Procedure: ABDOMINAL  AORTOGRAM W/LOWER EXTREMITY;  Surgeon: Waynetta Sandy, MD;  Location: [facility];  Service: Cardiovascular;  Laterality: Bilateral;  . ABDOMINAL HYSTERECTOMY     "partial" (11/03/2012)  . AV FISTULA PLACEMENT Right 12/03/2013   Procedure: CREATION OF ARTERIOVENOUS (AV) FISTULA RIGHT ARM ;  Surgeon: Rosetta Posner, MD;  Location: [facility];  Service: Vascular;  Laterality: Right;  . AV FISTULA PLACEMENT Left 05/17/2018   Procedure: CREATION OF BRACHIOCEPHALIC FISTULA LEFT ARM;  Surgeon: Waynetta Sandy, MD;  Location: [facility];  Service: Vascular;  Laterality: Left;  . [place] REMOVAL Right 03/16/2018   Procedure: REMOVAL OF ARTERIOVENOUS GORETEX GRAFT ([place]) RIGHT ARM;  Surgeon: Waynetta Sandy, MD;  Location: [facility];  Service: Vascular;  Laterality: Right;  . BASCILIC VEIN TRANSPOSITION Right 03/06/2014   Procedure: SECOND STAGE BASILIC VEIN TRANSPOSITION;  Surgeon: Rosetta Posner, MD;  Location: [place];  Service: Vascular;  Laterality: Right;  . BIOPSY  09/30/2018   Procedure: BIOPSY;  Surgeon: Mauri Pole, MD;  Location: [facility] ENDOSCOPY;  Service: Endoscopy;;  . BIOPSY  08/01/2019   Procedure: BIOPSY;  Surgeon: Ronald Lobo, MD;  Location[facility] ENDOSCOPY;  Service: Endoscopy;;  . BIOPSY  10/14/2019   Procedure: BIOPSY;  Surgeon: Doran Stabler, MD;  Location: [facility] ENDOSCOPY;  Service: Gastroenterology;;  . ENTEROSCOPY N/A 10/06/2018   Procedure: ENTEROSCOPY;  Surgeon: Lavena Bullion, DO;  Location: [place];  Service: Gastroenterology;  Laterality: N/A;  . ESOPHAGOGASTRODUODENOSCOPY  N/A 06/05/2013   Procedure: ESOPHAGOGASTRODUODENOSCOPY (EGD);  Surgeon: Winfield Cunas., MD;  Location: [facility];  Service: Endoscopy;  Laterality: N/A;  . ESOPHAGOGASTRODUODENOSCOPY N/A 09/08/2016   Procedure: ESOPHAGOGASTRODUODENOSCOPY (EGD);  Surgeon: Milus Banister, MD;  Location: [facility];  Service: Endoscopy;  Laterality: N/A;  . ESOPHAGOGASTRODUODENOSCOPY (EGD) WITH PROPOFOL N/A 09/17/2018   Procedure: ESOPHAGOGASTRODUODENOSCOPY (EGD) WITH PROPOFOL;  Surgeon: Carol Ada, MD;  Location: [place];  Service: Endoscopy;  Laterality: N/A;  . ESOPHAGOGASTRODUODENOSCOPY (EGD) WITH PROPOFOL N/A 09/30/2018   Procedure: ESOPHAGOGASTRODUODENOSCOPY (EGD) WITH PROPOFOL;  Surgeon: Mauri Pole, MD;  Location: [facility] ENDOSCOPY;  Service: Endoscopy;  Laterality: N/A;  . ESOPHAGOGASTRODUODENOSCOPY (EGD) WITH PROPOFOL N/A 08/01/2019   Procedure: ESOPHAGOGASTRODUODENOSCOPY (EGD) WITH PROPOFOL;  Surgeon: Ronald Lobo, MD;  Location: [facility];  Service: Endoscopy;  Laterality: N/A;  . ESOPHAGOGASTRODUODENOSCOPY (EGD) WITH PROPOFOL N/A 10/14/2019   Procedure: ESOPHAGOGASTRODUODENOSCOPY (EGD) WITH PROPOFOL;  Surgeon: Doran Stabler, MD;  Location: [facility];  Service: Gastroenterology;  Laterality: N/A;  . INSERTION OF DIALYSIS CATHETER Left 03/16/2018   Procedure: PLACEMENT OF TUNNEL DIALYSIS CATHETER;  Surgeon: Waynetta Sandy, MD;  Location: [facility];  Service: Vascular;  Laterality: Left;  . INSERTION OF DIALYSIS CATHETER Right 09/26/2018   Procedure: INSERTION OF DIALYSIS  TUNNELED CATHETER;  Surgeon: Elam Dutch, MD;  Location: [facility] OR;  Service: Vascular;  Laterality: Right;  . IR REMOVAL TUN CV CATH W/O FL  09/17/2018  . PERIPHERAL VASCULAR INTERVENTION  01/21/2020   Procedure: PERIPHERAL VASCULAR INTERVENTION;  Surgeon: Waynetta Sandy, MD;  Location: [place] [facility];  Service: Cardiovascular;;  left SFA  . REVISON OF ARTERIOVENOUS FISTULA Right 01/28/2017    Procedure: REVISON OF RIGHT ARM ARTERIOVENOUS FISTULA USING 8MMX10CM GORETEX GRAFT;  Surgeon: Angelia Mould, MD;  Location: [facility];  Service: Vascular;  Laterality: Right;    Social History:  Ambulatory  [place]      reports that she has been smoking cigarettes. She has a 20.00 pack-year  smoking history. She has never used smokeless tobacco. She reports previous alcohol use. She reports previous drug use. Drug: Marijuana.    Family History:   Family History  Problem Relation Age of Onset  . Lupus Mother   . Diabetes Father   . Heart attack Father 51    Allergies: Allergies  Allergen Reactions  . Cefazolin Other (See Comments)    Severe thrombocytopenia (has tolerated zosyn in the past) ID aware Tolerated in October 2019  . Cephalosporins Anaphylaxis  . Tuberculin Swelling and Other (See Comments)    Site of swelling not known     Prior to Admission medications   Medication Sig Start Date End Date Taking? Authorizing Provider  albuterol (PROVENTIL HFA;VENTOLIN HFA) 108 (90 Base) MCG/ACT inhaler TAKE 2 PUFFS BY MOUTH EVERY 6 HOURS AS NEEDED FOR WHEEZE OR SHORTNESS OF BREATH Patient taking differently: Inhale 2 puffs into the lungs every 6 (six) hours as needed for wheezing or shortness of breath. 05/09/18   [name], MD  B Complex-C-Zn-Folic Acid (DIALYVITE 185 WITH ZINC) 0.8 MG TABS Take 1 tablet by mouth daily.    [provider]  b complex-vitamin c-folic acid (NEPHRO-VITE) 0.8 MG TABS tablet Take 1 tablet by mouth every evening. Patient not taking: Reported on 05/19/2020 11/16/19   [name], MD  calcium acetate (PHOSLO) 667 MG capsule Take 2 capsules (1,334 mg total) by mouth 3 (three) times daily. 11/16/19   [name], MD  cinacalcet (SENSIPAR) 30 MG tablet TAKE 1 TABLET BY MOUTH DAILY EVERY EVENING (DO NOT TAKE LESS THAN 12 HOURS PRIOR TO DIALYSIS) Patient taking differently: Take 30 mg by mouth See admin instructions. Take 30  mg by mouth in the evening and NOT LESS THAN 12 HOURS PRIOR TO DIALYSIS 11/16/19   [name], MD  clopidogrel (PLAVIX) 75 MG tablet Take 1 tablet (75 mg total) by mouth daily. 02/06/20 02/05/21  [name], MD  ferric citrate (AURYXIA) 1 GM 210 MG(Fe) tablet Take 420 mg by mouth 3 (three) times daily with meals.    [provider]  lidocaine-prilocaine (EMLA) cream Apply 1 application topically See admin instructions. Apply as directed before dialysis 09/25/19   [provider]  metoprolol tartrate (LOPRESSOR) 25 MG tablet Take 0.5 tablets (12.5 mg total) by mouth 2 (two) times daily. 11/16/19   [name], MD  mirtazapine (REMERON) 7.5 MG tablet Take 7.5 mg by mouth at bedtime. 12/18/19   [provider]  nicotine (NICODERM CQ - DOSED IN MG/24 HOURS) 21 mg/24hr patch Place 1 patch (21 mg total) onto the skin daily. 11/16/19   [name], MD  pantoprazole (PROTONIX) 40 MG tablet Take 1 tablet (40 mg total) by mouth 2 (two) times daily before a meal. for 2 months, then once daily 11/16/19   [name], MD  rosuvastatin (CRESTOR) 10 MG tablet Take 1 tablet (10 mg total) by mouth daily. 01/21/20 01/20/21  Waynetta Sandy, MD  triamcinolone cream (KENALOG) 0.1 % Apply 1 application topically 2 (two) times daily as needed. To right arm for itching Patient taking differently: Apply 1 application topically 2 (two) times daily as needed (to right arm, for itching). 11/16/19   [name], MD   Physical Exam: UDJSHF with BMI 05/19/2020 05/19/2020 05/19/2020  Height - - -  Weight - - -  BMI - - -  Systolic 77 67 75  Diastolic 56 49 59  Pulse 026 - 107  1. General:  in No  Acute distress   Chronically ill -appearing 2. Psychological: Alert and  Oriented 3. Head/ENT:    Dry Mucous Membranes                          Head Non traumatic, neck supple                            Poor Dentition 4. SKIN:  decreased Skin turgor,  Skin  clean Dry and intact no rash 5. Heart: Regular rate and rhythm no  Murmur, no Rub or gallop 6. Lungs:   no wheezes or crackles   7. Abdomen: Soft,   non-tender, Non distended  Thin   8. Lower extremities: no clubbing, cyanosis, no edema 9. Neurologically Grossly intact, moving all 4 extremities equally   10. MSK: Normal range of motion   All other LABS:     Recent Labs  Lab 05/19/20 1655  WBC 8.1  NEUTROABS 6.3  HGB 7.6*  HCT 22.8*  MCV 83.2  PLT 87*     Recent Labs  Lab 05/19/20 1336 05/19/20 1655 05/19/20 1748  NA 141  --   --   K 2.4* 2.1*  --   CL 109  --   --   CO2 13*  --   --   GLUCOSE 53*  --   --   BUN 31*  --   --   CREATININE 9.69*  --   --   CALCIUM 6.7*  --   --   MG  --   --  1.9  PHOS  --   --  7.2*     Recent Labs  Lab 05/19/20 1336  AST 28  ALT 14  ALKPHOS 57  BILITOT 1.0  PROT 3.9*  ALBUMIN 1.5*       Cultures:    Component Value Date/Time   SDES BLOOD RIGHT HAND 09/16/2018 0252   SPECREQUEST  09/16/2018 0252    BOTTLES DRAWN AEROBIC ONLY Blood Culture adequate volume   CULT  09/16/2018 0252    NO GROWTH 5 DAYS Performed at [facility], [place] [address], [place], [place] [address]    REPTSTATUS 09/21/2018 FINAL 09/16/2018 0252     Radiological Exams on Admission: DG Chest Port 1 View  Result Date: 05/19/2020 CLINICAL DATA:  History of GI bleed and missed dialysis session EXAM: PORTABLE CHEST 1 VIEW COMPARISON:  10/13/2019 FINDINGS: Cardiac shadow is within normal limits. Aortic calcifications are seen. The lungs are clear bilaterally. No bony abnormality is seen. IMPRESSION: No active disease. Electronically Signed   By: Inez Catalina M.D.   On: 05/19/2020 11:55    Chart has been reviewed    Assessment/Plan   59 y.o. female with medical history significant of ESRD, HTN, anemia of chronic disease, PAD on Plavix, hx of Upper gi bleed, chronic diastolic CHF, asthma, atrial fibrillation not on anticoagulation secondary to  recurrent bleeding, GERD, hepatitis C antibody positive, aortic insufficiency, history of pulmonary hypertension due to left ventricular diastolic dysfunction, tobacco abuse Admitted for  Gi bleed  Present on Admission: . Upper GI bleeding -  - [place] score BUN >18.2   Hg < <83J systolic BP <825   melena   hepatic disease  cHF .  >1 Justifies admission and aggressive management      Modifying risk factors include:  hx of PUD  liver disease   Prior hx of GI bleed       -    AIMS 65 = Alb <3,  INR >1.5   SBP <90 (0-4) TOTAL of 3     Worrisome       -  hemodynamic instability present      - Admit to stepdown given above    -  ER  Provider spoke to gastroenterology (  [facility]) they will see patient in a.m. appreciate their consult   - serial CBC.    - Monitor for any recurrence,  evidence of hemodynamic instability or significant blood loss  - Transfuse as needed for hemoglobin below 7 or evidence of life-threatening bleeding  - Establish at least 2 PIV and fluid resuscitate   - clear liquids for tonight keep nothing by mouth post midnight,   -  administer Protonix  drip   -  Administer octreotide given possibility of variceal bleeding,   Given persistent hypotension PCCM team to admit to ICU overnight   . Tobacco abuse -  - Spoke about importance of quitting spent 5 minutes discussing options for treatment, prior attempts at quitting, and dangers of smoking  -At this point patient is   NOT  interested in quitting right away  - order nicotine patch when stable  - nursing tobacco cessation protocol  . Thrombocytopenia ([place]) - may need transfusion unclear etiology possibly due to underlining liver disease    . Symptomatic anemia - sp 2 units may need additional blood transfusion folow serial CBC  . Pulmonary hypertension due to left ventricular diastolic dysfunction (HCC) - chronic   . Hypokalemia -appreciate nephrology assistance given that patient hemodialysis  . Hepatic  cirrhosis ([place]) - [place] from March 2021 showed fatty liver No evidence of cirrhosis at that time. Elevated INR low albumin and thrombocytopenia could be secondary to underlying liver disease ammonia probably further evaluation  . Gastroesophageal reflux disease on PPI  . Chronic diastolic (congestive) heart failure (HCC) -avoid fluid overload current appears to be in a dry side     . Hyperphosphatemia -appreciate nephrology input restart medications when able to Tolerate  . Hypoglycemia -monitor blood sugar and treat as needed  Hypoalbuminemia -check prealbumin unclear if hepatic nature versus malnutrition Order nutritional consult  Suspected malnutrition will order nutritional consult check prealbumin and administer thiamine  History of hepatitis C will obtain viral load levels  Other plan as per orders.  DVT prophylaxis:  SCD       Code Status:    Code Status: Prior FULL CODE  as per patient   I had personally discussed CODE STATUS with patient      Family Communication:   Family not at  Bedside   Disposition Plan:    likely will need placement for rehabilitation                            Precautions: admitted as  Covid Negative     PPE: Used by the provider:    P100  eye Goggles,  Gloves     Elsey Holts 05/20/2020, 1:32 AM    [facility]     after 2 AM please page floor coverage PA If 7AM-7PM, please contact the day team taking care of the patient using [email or web address]   Patient was evaluated in the context of the global COVID-19 pandemic, which necessitated consideration that the patient might be at risk for infection with the SARS-CoV-2  virus that causes COVID-19. Institutional protocols and algorithms that pertain to the evaluation of patients at risk for COVID-19 are in a state of rapid change based on information released by regulatory bodies including the CDC and federal and state organizations. These policies and algorithms were followed during  the patient's care.

## 2020-05-19 NOTE — ED Triage Notes (Signed)
Pt BIB [facility] from home. Pt complaint of GI bleeding that has been going on for approximately 1 week. Per pt husband, pt has soaked 60 sanitary napkins in that time frame. Pt missed all dialysis treatments last week. Pt hypotensive with EMS with BP 89/64. Pt A&Ox4 upon arrival. NAD. Left arm restriction.

## 2020-05-19 NOTE — Consult Note (Signed)
[facility]  INPATIENT CONSULTATION  Reason for Consultation: ESRD provision of dialysis and comanagement of assoc conditions Requesting Provider: Dr. Regenia Skeeter  HPI: Karen Morrow is an 59 y.o. female with ESRD on HD TTS at [facility], A fib, HFpEF, recurrent GI bleeding who presented to ED today with GI bleed.   Presented with 1wk h/o GI bleeding - soaking reported 60 sanitary pads over that week.  Hb 7.6 and she's currently having hypotension 70/40s and is being transfused.  C diff antigen +.  Labs showing Na 141, K 2.1, Bicarb 13, BUN 53, alb 1.5.  Hb was 9.9 on 12/11 at HD.  She c/o mild abd pain, no dyspnea, no fevers.    She had last HD 12/11, post wt 37.5kg from 42kg EDW (was 49.5kg in 09/2019).  Was unable to attend due to weakness and GI bleeding.  Reports lives with husband who transports her to dialysis typically.   PMH: Past Medical History:  Diagnosis Date  . Anginal pain ([place])   . Asthma   . Atrial fibrillation ([place])    not on AC  . CHF (congestive heart failure) ([place])   . Chronic kidney disease    dialysis 3x wk  . Dyspnea   . Frequent bowel movements   . GERD (gastroesophageal reflux disease)   . Hepatitis C antibody test positive   . Hypertension    "just dx'd today" (11/03/2012)   PSH: Past Surgical History:  Procedure Laterality Date  . ABDOMINAL AORTOGRAM W/LOWER EXTREMITY Bilateral 01/21/2020   Procedure: ABDOMINAL AORTOGRAM W/LOWER EXTREMITY;  Surgeon: Waynetta Sandy, MD;  Location: [place] [facility];  Service: Cardiovascular;  Laterality: Bilateral;  . ABDOMINAL HYSTERECTOMY     "partial" (11/03/2012)  . AV FISTULA PLACEMENT Right 12/03/2013   Procedure: CREATION OF ARTERIOVENOUS (AV) FISTULA RIGHT ARM ;  Surgeon: Rosetta Posner, MD;  Location: [place];  Service: Vascular;  Laterality: Right;  . AV FISTULA PLACEMENT Left 05/17/2018   Procedure: CREATION OF BRACHIOCEPHALIC FISTULA LEFT ARM;  Surgeon: Waynetta Sandy, MD;  Location: [place];  Service: Vascular;  Laterality: Left;  . [place] REMOVAL Right 03/16/2018   Procedure: REMOVAL OF ARTERIOVENOUS GORETEX GRAFT ([place]) RIGHT ARM;  Surgeon: Waynetta Sandy, MD;  Location: [place];  Service: Vascular;  Laterality: Right;  . BASCILIC VEIN TRANSPOSITION Right 03/06/2014   Procedure: SECOND STAGE BASILIC VEIN TRANSPOSITION;  Surgeon: Rosetta Posner, MD;  Location: [place];  Service: Vascular;  Laterality: Right;  . BIOPSY  09/30/2018   Procedure: BIOPSY;  Surgeon: Mauri Pole, MD;  Location: [facility] ENDOSCOPY;  Service: Endoscopy;;  . BIOPSY  08/01/2019   Procedure: BIOPSY;  Surgeon: Ronald Lobo, MD;  Location: [facility]COPY;  Service: Endoscopy;;  . BIOPSY  10/14/2019   Procedure: BIOPSY;  Surgeon: Doran Stabler, MD;  Location: [facility] ENDOSCOPY;  Service: Gastroenterology;;  . ENTEROSCOPY N/A 10/06/2018   Procedure: ENTEROSCOPY;  Surgeon: Lavena Bullion, DO;  Location: [place];  Service: Gastroenterology;  Laterality: N/A;  . ESOPHAGOGASTRODUODENOSCOPY N/A 06/05/2013   Procedure: ESOPHAGOGASTRODUODENOSCOPY (EGD);  Surgeon: Winfield Cunas., MD;  Location: [facility];  Service: Endoscopy;  Laterality: N/A;  . ESOPHAGOGASTRODUODENOSCOPY N/A 09/08/2016   Procedure: ESOPHAGOGASTRODUODENOSCOPY (EGD);  Surgeon: Milus Banister, MD;  Location: [place];  Service: Endoscopy;  Laterality: N/A;  . ESOPHAGOGASTRODUODENOSCOPY (EGD) WITH PROPOFOL N/A 09/17/2018   Procedure: ESOPHAGOGASTRODUODENOSCOPY (EGD) WITH PROPOFOL;  Surgeon: Carol Ada, MD;  Location: [place];  Service: Endoscopy;  Laterality: N/A;  . ESOPHAGOGASTRODUODENOSCOPY (EGD) WITH PROPOFOL N/A 09/30/2018   Procedure: ESOPHAGOGASTRODUODENOSCOPY (EGD) WITH PROPOFOL;  Surgeon: Mauri Pole, MD;  Location: [place] ENDOSCOPY;  Service: Endoscopy;  Laterality: N/A;  . ESOPHAGOGASTRODUODENOSCOPY (EGD) WITH PROPOFOL N/A 08/01/2019   Procedure: ESOPHAGOGASTRODUODENOSCOPY (EGD) WITH PROPOFOL;  Surgeon: Ronald Lobo, MD;  Location: [place];  Service: Endoscopy;  Laterality: N/A;  . ESOPHAGOGASTRODUODENOSCOPY (EGD) WITH PROPOFOL N/A 10/14/2019   Procedure: ESOPHAGOGASTRODUODENOSCOPY (EGD) WITH PROPOFOL;  Surgeon: Doran Stabler, MD;  Location: [place];  Service: Gastroenterology;  Laterality: N/A;  . INSERTION OF DIALYSIS CATHETER Left 03/16/2018   Procedure: PLACEMENT OF TUNNEL DIALYSIS CATHETER;  Surgeon: Waynetta Sandy, MD;  Location: [place];  Service: Vascular;  Laterality: Left;  . INSERTION OF DIALYSIS CATHETER Right 09/26/2018   Procedure: INSERTION OF DIALYSIS  TUNNELED CATHETER;  Surgeon: Elam Dutch, MD;  Location: [facility] OR;  Service: Vascular;  Laterality: Right;  . IR REMOVAL TUN CV CATH W/O FL  09/17/2018  . PERIPHERAL VASCULAR INTERVENTION  01/21/2020   Procedure: PERIPHERAL VASCULAR INTERVENTION;  Surgeon: Waynetta Sandy, MD;  Location: [place] CV LAB;  Service: Cardiovascular;;  left SFA  . REVISON OF ARTERIOVENOUS FISTULA Right 01/28/2017   Procedure: REVISON OF RIGHT ARM ARTERIOVENOUS FISTULA USING 8MMX10CM GORETEX GRAFT;  Surgeon: Angelia Mould, MD;  Location: [place];  Service: Vascular;  Laterality: Right;     Past Medical History:  Diagnosis Date  . Anginal pain ([place])   . Asthma   . Atrial fibrillation ([place])    not on AC  . CHF (congestive heart failure) ([place])   . Chronic kidney disease    dialysis 3x wk  . Dyspnea   . Frequent bowel movements   . GERD (gastroesophageal reflux disease)   . Hepatitis C antibody test positive   . Hypertension    "just dx'd today" (11/03/2012)    Medications:  I have reviewed the patient's current medications.  (Not in a hospital admission)   ALLERGIES:   Allergies  Allergen Reactions  . Cefazolin Other (See Comments)    Severe thrombocytopenia (has tolerated zosyn in the past) ID aware Tolerated in October 2019  . Cephalosporins Anaphylaxis  . Tuberculin Swelling and Other (See  Comments)    Site of swelling not known    FAM HX: Family History  Problem Relation Age of Onset  . Lupus Mother   . Diabetes Father   . Heart attack Father 12    Social History:   reports that she has been smoking cigarettes. She has a 20.00 pack-year smoking history. She has never used smokeless tobacco. She reports previous alcohol use. She reports previous drug use. Drug: Marijuana.  ROS: 12 system ROS per HPI above  Blood pressure (!) 63/46, pulse (!) 105, temperature 98.3 F (36.8 C), temperature source Oral, resp. rate 20, height 5\' 7"  (1.702 m), weight 71.7 kg, SpO2 97 %. PHYSICAL EXAM: Gen: cachetic, lethargic but can converse  Eyes:  Muddy sclera, anicteric ENT: MM dry, bad dentition Neck: supple CV:  Tachycardic, regular, blowing systolic murmur 3/6 Abd: soft, scaphoid, mildly TTP Back: clear lungs GU: no foley Extr:  No edema, LUE AVF +t/b Neuro: grossly nonfocal but slow to respond to questions and not clear on details or unwilling to answer Skin: warm and dry, no rashes   Results for orders placed or performed during the hospital encounter of 05/19/20 (from the past 48 hour(s))  Resp Panel by RT-PCR (Flu A&B, Covid)  Nasopharyngeal Swab     Status: None   Collection Time: 05/19/20 10:57 AM   Specimen: Nasopharyngeal Swab; Nasopharyngeal(NP) swabs in vial transport medium  Result Value Ref Range   SARS Coronavirus 2 by RT PCR NEGATIVE NEGATIVE    Comment: (NOTE) SARS-CoV-2 target nucleic acids are NOT DETECTED.  The SARS-CoV-2 RNA is generally detectable in upper respiratory specimens during the acute phase of infection. The lowest concentration of SARS-CoV-2 viral copies this assay can detect is 138 copies/mL. A negative result does not preclude SARS-Cov-2 infection and should not be used as the sole basis for treatment or other patient management decisions. A negative result may occur with  improper specimen collection/handling, submission of specimen  other than nasopharyngeal swab, presence of viral mutation(s) within the areas targeted by this assay, and inadequate number of viral copies(<138 copies/mL). A negative result must be combined with clinical observations, patient history, and epidemiological information. The expected result is Negative.  Fact Sheet for Patients:  EntrepreneurPulse.com.au  Fact Sheet for Healthcare Providers:  IncredibleEmployment.be  This test is no t yet approved or cleared by the [place] FDA and  has been authorized for detection and/or diagnosis of SARS-CoV-2 by FDA under an Emergency Use Authorization (EUA). This EUA will remain  in effect (meaning this test can be used) for the duration of the COVID-19 declaration under Section 564(b)(1) of the Act, 21 U.S.C.section 360bbb-3(b)(1), unless the authorization is terminated  or revoked sooner.       Influenza A by PCR NEGATIVE NEGATIVE   Influenza B by PCR NEGATIVE NEGATIVE    Comment: (NOTE) The Xpert Xpress SARS-CoV-2/FLU/RSV plus assay is intended as an aid in the diagnosis of influenza from Nasopharyngeal swab specimens and should not be used as a sole basis for treatment. Nasal washings and aspirates are unacceptable for Xpert Xpress SARS-CoV-2/FLU/RSV testing.  Fact Sheet for Patients: EntrepreneurPulse.com.au  Fact Sheet for Healthcare Providers: IncredibleEmployment.be  This test is not yet approved or cleared by the [place] FDA and has been authorized for detection and/or diagnosis of SARS-CoV-2 by FDA under an Emergency Use Authorization (EUA). This EUA will remain in effect (meaning this test can be used) for the duration of the COVID-19 declaration under Section 564(b)(1) of the Act, 21 U.S.C. section 360bbb-3(b)(1), unless the authorization is terminated or revoked.  Performed at [facility], [place] [address] [place], [place] [address]    POC occult blood, ED     Status: Abnormal   Collection Time: 05/19/20 11:15 AM  Result Value Ref Range   Fecal Occult Bld POSITIVE (A) NEGATIVE  Protime-INR     Status: Abnormal   Collection Time: 05/19/20  1:36 PM  Result Value Ref Range   Prothrombin Time 19.6 (H) 11.4 - 15.2 seconds   INR 1.7 (H) 0.8 - 1.2    Comment: (NOTE) INR goal varies based on device and disease states. Performed at [facility], [place] [address], [place], [place] [address]   Comprehensive metabolic panel     Status: Abnormal   Collection Time: 05/19/20  1:36 PM  Result Value Ref Range   Sodium 141 135 - 145 mmol/L   Potassium 2.4 (LL) 3.5 - 5.1 mmol/L    Comment: HEMOLYSIS AT THIS LEVEL MAY AFFECT RESULT QUESTIONABLE RESULTS, RECOMMEND RECOLLECT TO VERIFY OK PER RN CRITICAL RESULT CALLED TO, READ BACK BY AND VERIFIED WITH: L VENGAS,RN 1631 05/19/2020 WBOND    Chloride 109 98 - 111 mmol/L   CO2 13 (  L) 22 - 32 mmol/L   Glucose, Bld 53 (L) 70 - 99 mg/dL    Comment: Glucose reference range applies only to samples taken after fasting for at least 8 hours.   BUN 31 (H) 6 - 20 mg/dL   Creatinine, Ser 9.69 (H) 0.44 - 1.00 mg/dL   Calcium 6.7 (L) 8.9 - 10.3 mg/dL   Total Protein 3.9 (L) 6.5 - 8.1 g/dL   Albumin 1.5 (L) 3.5 - 5.0 g/dL   AST 28 15 - 41 U/L   ALT 14 0 - 44 U/L   Alkaline Phosphatase 57 38 - 126 U/L   Total Bilirubin 1.0 0.3 - 1.2 mg/dL   GFR, Estimated 4 (L) >60 mL/min    Comment: (NOTE) Calculated using the CKD-EPI Creatinine Equation (2021)    Anion gap 19 (H) 5 - 15    Comment: Performed at [facility], [place] [address], [place], [place] [address]  Type and screen Ordered by PROVIDER DEFAULT     Status: None (Preliminary result)   Collection Time: 05/19/20  1:38 PM  Result Value Ref Range   ABO/RH(D) O POS    Antibody Screen NEG    Sample Expiration      05/22/2020,2359 Performed at [facility], [place] [address] [place], [place] [address]    Unit Number  [id number]    Blood Component Type RED CELLS,LR    Unit division 00    Status of Unit ISSUED    Transfusion Status OK TO TRANSFUSE    Crossmatch Result Compatible    Unit Number [id number]    Blood Component Type RED CELLS,LR    Unit division 00    Status of Unit ALLOCATED    Transfusion Status OK TO TRANSFUSE    Crossmatch Result Compatible   CBG monitoring, ED     Status: Abnormal   Collection Time: 05/19/20  4:49 PM  Result Value Ref Range   Glucose-Capillary 50 (L) 70 - 99 mg/dL    Comment: Glucose reference range applies only to samples taken after fasting for at least 8 hours.  Potassium     Status: Abnormal   Collection Time: 05/19/20  4:55 PM  Result Value Ref Range   Potassium 2.1 (LL) 3.5 - 5.1 mmol/L    Comment: NO VISIBLE HEMOLYSIS CRITICAL RESULT CALLED TO, READ BACK BY AND VERIFIED WITH: L VENGAS,RN 1740 05/19/2020 WBOND Performed at [facility], [place] [address], [place], [place] [address]   CBC with Differential     Status: Abnormal   Collection Time: 05/19/20  4:55 PM  Result Value Ref Range   WBC 8.1 4.0 - 10.5 K/uL   RBC 2.74 (L) 3.87 - 5.11 MIL/uL   Hemoglobin 7.6 (L) 12.0 - 15.0 g/dL   HCT 22.8 (L) 36.0 - 46.0 %   MCV 83.2 80.0 - 100.0 fL   MCH 27.7 26.0 - 34.0 pg   MCHC 33.3 30.0 - 36.0 g/dL   RDW 16.5 (H) 11.5 - 15.5 %   Platelets 87 (L) 150 - 400 K/uL    Comment: REPEATED TO VERIFY PLATELET COUNT CONFIRMED BY SMEAR SPECIMEN CHECKED FOR CLOTS Immature Platelet Fraction may be clinically indicated, consider ordering this additional test [id number]    nRBC 0.0 0.0 - 0.2 %   Neutrophils Relative % 77 %   Neutro Abs 6.3 1.7 - 7.7 K/uL   Lymphocytes Relative 16 %   Lymphs Abs 1.3 0.7 - 4.0 K/uL   Monocytes Relative  6 %   Monocytes Absolute 0.5 0.1 - 1.0 K/uL   Eosinophils Relative 0 %   Eosinophils Absolute 0.0 0.0 - 0.5 K/uL   Basophils Relative 0 %   Basophils Absolute 0.0 0.0 - 0.1 K/uL   Immature Granulocytes 1 %   Abs Immature  Granulocytes 0.05 0.00 - 0.07 K/uL    Comment: Performed at [facility] [address], [place], [place] [address]  CBG monitoring, ED     Status: Abnormal   Collection Time: 05/19/20  5:30 PM  Result Value Ref Range   Glucose-Capillary 50 (L) 70 - 99 mg/dL    Comment: Glucose reference range applies only to samples taken after fasting for at least 8 hours.  C Difficile Quick Screen w PCR reflex     Status: Abnormal   Collection Time: 05/19/20  5:47 PM   Specimen: STOOL  Result Value Ref Range   C Diff antigen POSITIVE (A) NEGATIVE   C Diff toxin NEGATIVE NEGATIVE   C Diff interpretation Results are indeterminate. See PCR results.     Comment: Performed at [facility], [place] [address] [place], [place] [address]  C. Diff by PCR, Reflexed     Status: None   Collection Time: 05/19/20  5:47 PM  Result Value Ref Range   Toxigenic C. Difficile by PCR NEGATIVE NEGATIVE    Comment: Patient is colonized with non toxigenic C. difficile. May not need treatment unless significant symptoms are present. Performed at [facility], [place] [address] [place], [place] [address]   Magnesium     Status: None   Collection Time: 05/19/20  5:48 PM  Result Value Ref Range   Magnesium 1.9 1.7 - 2.4 mg/dL    Comment: Performed at [facility], [place] [address], [place], [place] [address]  Phosphorus     Status: Abnormal   Collection Time: 05/19/20  5:48 PM  Result Value Ref Range   Phosphorus 7.2 (H) 2.5 - 4.6 mg/dL    Comment: Performed at [facility] [address], [place], [place] [address]  Prepare RBC (crossmatch)     Status: None   Collection Time: 05/19/20  5:49 PM  Result Value Ref Range   Order Confirmation      ORDER PROCESSED BY BLOOD BANK Performed at [facility], [place] [address] [place], [place] [address]   CBG monitoring, ED     Status: Abnormal   Collection Time: 05/19/20  7:29 PM  Result Value Ref Range   Glucose-Capillary 100 (H) 70 - 99 mg/dL     Comment: Glucose reference range applies only to samples taken after fasting for at least 8 hours.  Prepare RBC (crossmatch)     Status: None   Collection Time: 05/19/20  8:00 PM  Result Value Ref Range   Order Confirmation      ORDER PROCESSED BY BLOOD BANK Performed at [facility], [place] [address], [place], [place] [address]     DG Chest Port 1 View  Result Date: 05/19/2020 CLINICAL DATA:  History of GI bleed and missed dialysis session EXAM: PORTABLE CHEST 1 VIEW COMPARISON:  10/13/2019 FINDINGS: Cardiac shadow is within normal limits. Aortic calcifications are seen. The lungs are clear bilaterally. No bony abnormality is seen. IMPRESSION: No active disease. Electronically Signed   By: Inez Catalina M.D.   On: 05/19/2020 11:55   HD Orders:  [facility] TTS 350/600 EDW 42kg 3:45h RUE AVF  3K/2.25 Ca 12/11 last tx -post wt 37.5kg, intradialytic hypotension noted; K 2.8, alb 2.3 12/11 Hb 9.9, mircera 50 q2wks, venofer 50 qweek 12/2 phos 4.9, Ca 7.7, PTH 276  Assessment/Plan **GIB with ABLA:  Being transfused and GI is consulted.  Per primary.  Cont ESA if remains admitted.   **Shock: presumably hypovolemia and hemorrhagic with GIB and diarrhea.  She's afebrile.  S/p NS 0.5L bolus now pRBCs being given.  If remains hypotensive can consider additional small fluid boluses pending clinical status.   **ESRD on HD: plan HD tomorrow per usual schedule but will run abbreviated tx at 3hrs due to 1+ wk missed HD.  BP may be a limiting factor = will need to reassess after volume tonight.    **AGMA: missed HD, diarrhea, poss lactate with hypotension per above.  Will correct with HD and volume.   **Hypokalemia:  repleted IV and po in ED.  Trend.  4K dialysate tomorrow.   **BMM: Recent phos and PTH fine.  Cont hectorol 2 qtx, sensipar 30 daily, auryxia 2 TIDAC when taking po.   Justin Mend 05/19/2020, 7:55 PM

## 2020-05-19 NOTE — ED Provider Notes (Signed)
[facility] EMERGENCY DEPARTMENT Provider Note   CSN: [id number] Arrival date & time: 05/19/20  1030     History Chief Complaint  Patient presents with  . Rectal Bleeding    Karen Morrow is a 59 y.o. female.  Patient with history of end-stage kidney disease on dialysis, congestive heart failure, history of upper GI bleeding, last admission 09/2019 endoscopy showing cratered gastric ulcer and duodenitis, patient on chronic pantoprazole, history of peripheral arterial disease on Plavix --presents the emergency department with approximately 1 week of black and green stool.  EMS transported to the hospital.  Per husband's report, patient has missed approximately 1 week of dialysis sessions.  It is also reported that she soaked 60 sanitary napkins with stool during that time.  Patient feels generally weak.  She denies chest or abdominal pain.  Overall she is a poor historian.  Patient was hypotensive 89/64 with EMS.  Patient states that she has never had a blood transfusion (although records show transfusion performed in 07/2019).  She states that she has had never had bleeding quite this bad before.  The onset of this condition was acute. The course is constant. Aggravating factors: none. Alleviating factors: none.          Past Medical History:  Diagnosis Date  . Anginal pain ([place])   . Asthma   . Atrial fibrillation ([place])    not on AC  . CHF (congestive heart failure) ([place])   . Chronic kidney disease    dialysis 3x wk  . Dyspnea   . Frequent bowel movements   . GERD (gastroesophageal reflux disease)   . Hepatitis C antibody test positive   . Hypertension    "just dx'd today" (11/03/2012)    Patient Active Problem List   Diagnosis Date Noted  . UGIB (upper gastrointestinal bleed) 10/13/2019  . Acute upper GI bleeding 08/01/2019  . Hypertensive cardiomyopathy, without heart failure ([place]) 03/07/2019  . Fluid overload 11/27/2018  . Acute on chronic diastolic  CHF (congestive heart failure) ([place]) 11/27/2018  . Acute respiratory failure with hypoxia ([place]) 11/27/2018  . Leukocytosis 11/27/2018  . Volume overload 11/27/2018  . Hiatal hernia   . Gastrointestinal bleeding 09/29/2018  . GI bleed 09/29/2018  . Chronic hepatitis C ([place]) 09/23/2018  . Symptomatic anemia 09/16/2018  . Severe systemic inflammatory response syndrome (SIRS) ([place]) 09/16/2018  . Altered mental status 09/16/2018  . Tobacco abuse 09/16/2018  . Chronic diastolic (congestive) heart failure ([place]) 07/07/2018  . Aortic insufficiency 07/07/2018  . Hypertensive urgency 05/13/2018  . Infection of arteriovenous fistula ([place]) 04/25/2018  . MSSA bacteremia 04/25/2018  . Bacteremia   . Staphylococcal sepsis ([place])   . Sepsis ([place]) 03/10/2018  . Hyperkalemia   . Acute respiratory distress   . Pulmonary hypertension due to left ventricular diastolic dysfunction ([place]) 11/08/2016  . Chronic cough 10/15/2016  . Acute GI bleeding 09/07/2016  . Gastroesophageal reflux disease 04/16/2016  . Anemia in chronic kidney disease, on chronic dialysis ([place]) 04/16/2016  . Primary insomnia 04/16/2016  . Shortness of breath 04/16/2016  . Pruritus 12/11/2014  . Hordeolum externum (stye) 12/11/2014  . Xanthelasma of right upper eyelid 12/11/2014  . Dermatitis 10/30/2014  . ESRD on dialysis ([place]) 02/25/2014  . Malnutrition of moderate degree ([place]) 11/28/2013  . HTN (hypertension) 11/26/2013  . Gastric ulcer 06/07/2013  . Abdominal pain 11/03/2012  . Chest pain 11/03/2012  . Thrombocytopenia ([place]) 11/03/2012  . Elevated transaminase level 11/03/2012  . Hepatic cirrhosis ([place]) 11/03/2012  .  Hepatitis C antibody test positive     Past Surgical History:  Procedure Laterality Date  . ABDOMINAL AORTOGRAM W/LOWER EXTREMITY Bilateral 01/21/2020   Procedure: ABDOMINAL AORTOGRAM W/LOWER EXTREMITY;  Surgeon: Waynetta Sandy, MD;  Location: [facility];  Service: Cardiovascular;   Laterality: Bilateral;  . ABDOMINAL HYSTERECTOMY     "partial" (11/03/2012)  . AV FISTULA PLACEMENT Right 12/03/2013   Procedure: CREATION OF ARTERIOVENOUS (AV) FISTULA RIGHT ARM ;  Surgeon: Rosetta Posner, MD;  Location: [facility];  Service: Vascular;  Laterality: Right;  . AV FISTULA PLACEMENT Left 05/17/2018   Procedure: CREATION OF BRACHIOCEPHALIC FISTULA LEFT ARM;  Surgeon: Waynetta Sandy, MD;  Location: [facility];  Service: Vascular;  Laterality: Left;  . [place] REMOVAL Right 03/16/2018   Procedure: REMOVAL OF ARTERIOVENOUS GORETEX GRAFT ([place]) RIGHT ARM;  Surgeon: Waynetta Sandy, MD;  Location: [facility];  Service: Vascular;  Laterality: Right;  . BASCILIC VEIN TRANSPOSITION Right 03/06/2014   Procedure: SECOND STAGE BASILIC VEIN TRANSPOSITION;  Surgeon: Rosetta Posner, MD;  Location: [facility];  Service: Vascular;  Laterality: Right;  . BIOPSY  09/30/2018   Procedure: BIOPSY;  Surgeon: Mauri Pole, MD;  Location: [facility] ENDOSCOPY;  Service: Endoscopy;;  . BIOPSY  08/01/2019   Procedure: BIOPSY;  Surgeon: Ronald Lobo, MD;  Location: [facility] ENDOSCOPY;  Service: Endoscopy;;  . BIOPSY  10/14/2019   Procedure: BIOPSY;  Surgeon: Doran Stabler, MD;  Location: [facility] ENDOSCOPY;  Service: Gastroenterology;;  . ENTEROSCOPY N/A 10/06/2018   Procedure: ENTEROSCOPY;  Surgeon: Lavena Bullion, DO;  Location: [facility];  Service: Gastroenterology;  Laterality: N/A;  . ESOPHAGOGASTRODUODENOSCOPY N/A 06/05/2013   Procedure: ESOPHAGOGASTRODUODENOSCOPY (EGD);  Surgeon: Winfield Cunas., MD;  Location: [facility];  Service: Endoscopy;  Laterality: N/A;  . ESOPHAGOGASTRODUODENOSCOPY N/A 09/08/2016   Procedure: ESOPHAGOGASTRODUODENOSCOPY (EGD);  Surgeon: Milus Banister, MD;  Location: [facility];  Service: Endoscopy;  Laterality: N/A;  . ESOPHAGOGASTRODUODENOSCOPY (EGD) WITH PROPOFOL N/A 09/17/2018   Procedure: ESOPHAGOGASTRODUODENOSCOPY (EGD) WITH PROPOFOL;  Surgeon: Carol Ada, MD;  Location: [facility];  Service: Endoscopy;  Laterality: N/A;  . ESOPHAGOGASTRODUODENOSCOPY (EGD) WITH PROPOFOL N/A 09/30/2018   Procedure: ESOPHAGOGASTRODUODENOSCOPY (EGD) WITH PROPOFOL;  Surgeon: Mauri Pole, MD;  Location: [facility] ENDOSCOPY;  Service: Endoscopy;  Laterality: N/A;  . ESOPHAGOGASTRODUODENOSCOPY (EGD) WITH PROPOFOL N/A 08/01/2019   Procedure: ESOPHAGOGASTRODUODENOSCOPY (EGD) WITH PROPOFOL;  Surgeon: Ronald Lobo, MD;  Location: [facility];  Service: Endoscopy;  Laterality: N/A;  . ESOPHAGOGASTRODUODENOSCOPY (EGD) WITH PROPOFOL N/A 10/14/2019   Procedure: ESOPHAGOGASTRODUODENOSCOPY (EGD) WITH PROPOFOL;  Surgeon: Doran Stabler, MD;  Location: [facility];  Service: Gastroenterology;  Laterality: N/A;  . INSERTION OF DIALYSIS CATHETER Left 03/16/2018   Procedure: PLACEMENT OF TUNNEL DIALYSIS CATHETER;  Surgeon: Waynetta Sandy, MD;  Location: [facility];  Service: Vascular;  Laterality: Left;  . INSERTION OF DIALYSIS CATHETER Right 09/26/2018   Procedure: INSERTION OF DIALYSIS  TUNNELED CATHETER;  Surgeon: Elam Dutch, MD;  Location: [facility];  Service: Vascular;  Laterality: Right;  . IR REMOVAL TUN CV CATH W/O FL  09/17/2018  . PERIPHERAL VASCULAR INTERVENTION  01/21/2020   Procedure: PERIPHERAL VASCULAR INTERVENTION;  Surgeon: Waynetta Sandy, MD;  Location: [facility];  Service: Cardiovascular;;  left SFA  . REVISON OF ARTERIOVENOUS FISTULA Right 01/28/2017   Procedure: REVISON OF RIGHT ARM ARTERIOVENOUS FISTULA USING 8MMX10CM GORETEX GRAFT;  Surgeon: Angelia Mould, MD;  Location: [facility];  Service: Vascular;  Laterality: Right;     OB History   No obstetric history on file.     Family History  Problem Relation Age of Onset  . Lupus Mother   . Diabetes Father   . Heart attack Father 65    Social History   Tobacco Use  . Smoking status: Current Every Day Smoker    Packs/day: 0.50    Years: 40.00    Pack years: 20.00    Types: Cigarettes   . Smokeless tobacco: Never Used  . Tobacco comment: now down to 1/2 ppd  Vaping Use  . Vaping Use: Never used  Substance Use Topics  . Alcohol use: Not Currently    Alcohol/week: 0.0 standard drinks    Comment: never a heavy drinker, hasn't drank in years  . Drug use: Not Currently    Types: Marijuana    Comment: used to smoke once a day, remote h/o MJ    Home Medications Prior to Admission medications   Medication Sig Start Date End Date Taking? Authorizing Provider  acetaminophen (TYLENOL) 500 MG tablet Take 500-1,000 mg by mouth every 6 (six) hours as needed for headache (pain).    [provider]  albuterol (PROVENTIL HFA;VENTOLIN HFA) 108 (90 Base) MCG/ACT inhaler TAKE 2 PUFFS BY MOUTH EVERY 6 HOURS AS NEEDED FOR WHEEZE OR SHORTNESS OF BREATH Patient taking differently: Inhale 2 puffs into the lungs every 6 (six) hours as needed for wheezing or shortness of breath.  05/09/18   [name], MD  b complex-vitamin c-folic acid (NEPHRO-VITE) 0.8 MG TABS tablet Take 1 tablet by mouth every evening. 11/16/19   [name], MD  calcium acetate (PHOSLO) 667 MG capsule Take 2 capsules (1,334 mg total) by mouth 3 (three) times daily. 11/16/19   [name], MD  cinacalcet (SENSIPAR) 30 MG tablet TAKE 1 TABLET BY MOUTH DAILY EVERY EVENING (DO NOT TAKE LESS THAN 12 HOURS PRIOR TO DIALYSIS) 11/16/19   [name], MD  clopidogrel (PLAVIX) 75 MG tablet Take 1 tablet (75 mg total) by mouth daily. 02/06/20 02/05/21  [name], MD  lidocaine-prilocaine (EMLA) cream Apply 1 application topically See admin instructions. 1 application before dialysis. 09/25/19   [provider]  metoprolol tartrate (LOPRESSOR) 25 MG tablet Take 0.5 tablets (12.5 mg total) by mouth 2 (two) times daily. 11/16/19   [name], MD  mirtazapine (REMERON) 7.5 MG tablet Take 7.5 mg by mouth at bedtime. 12/18/19   [provider]  nicotine (NICODERM CQ - DOSED IN  MG/24 HOURS) 21 mg/24hr patch Place 1 patch (21 mg total) onto the skin daily. 11/16/19   [name], MD  pantoprazole (PROTONIX) 40 MG tablet Take 1 tablet (40 mg total) by mouth 2 (two) times daily before a meal. for 2 months, then once daily 11/16/19   [name], MD  rosuvastatin (CRESTOR) 10 MG tablet Take 1 tablet (10 mg total) by mouth daily. 01/21/20 01/20/21  Waynetta Sandy, MD  triamcinolone cream (KENALOG) 0.1 % Apply 1 application topically 2 (two) times daily as needed. To right arm for itching 11/16/19   [name], MD    Allergies    Cefazolin, Cephalosporins, and Tuberculin  Review of Systems   Review of Systems  Constitutional: Positive for fatigue. Negative for fever.  HENT: Negative for rhinorrhea and sore throat.   Eyes: Negative for redness.  Respiratory: Negative for cough and shortness of breath.   Cardiovascular: Negative for chest  pain.  Gastrointestinal: Positive for blood in stool. Negative for abdominal pain, diarrhea, nausea and vomiting.  Genitourinary: Negative for dysuria, frequency, hematuria and urgency.  Musculoskeletal: Negative for myalgias.  Skin: Negative for rash.  Neurological: Positive for weakness. Negative for syncope and headaches.    Physical Exam Updated Vital Signs BP (!) 88/71 (BP Location: Right Arm)   Pulse 100   Temp 97.8 F (36.6 C) (Oral)   Resp 18   Ht 5\' 7"  (1.702 m)   Wt 71.7 kg   SpO2 100%   BMI 24.75 kg/m   Physical Exam Vitals and nursing note reviewed.  Constitutional:      General: She is not in acute distress.    Appearance: She is well-developed. She is cachectic.  HENT:     Head: Normocephalic and atraumatic.     Right Ear: External ear normal.     Left Ear: External ear normal.     Nose: Nose normal. No congestion.  Eyes:     General:        Right eye: No discharge.        Left eye: No discharge.     Comments: Pale conjunctiva  Cardiovascular:     Rate and Rhythm:  Regular rhythm. Tachycardia present.  Pulmonary:     Effort: No respiratory distress.     Breath sounds: No wheezing, rhonchi or rales.  Abdominal:     Palpations: Abdomen is soft.     Tenderness: There is no abdominal tenderness. There is no guarding or rebound.  Genitourinary:    Comments: Pt with foul-smelling black/dark green watery stool in diaper.  Musculoskeletal:     Cervical back: Normal range of motion and neck supple.     Right lower leg: No edema.     Left lower leg: No edema.  Skin:    General: Skin is warm and dry.     Findings: No rash.  Neurological:     General: No focal deficit present.     Mental Status: She is alert. Mental status is at baseline.     Motor: No weakness.  Psychiatric:        Mood and Affect: Mood normal.     ED Results / Procedures / Treatments   Labs (all labs ordered are listed, but only abnormal results are displayed) Labs Reviewed  PROTIME-INR - Abnormal; Notable for the following components:      Result Value   Prothrombin Time 19.6 (*)    INR 1.7 (*)    All other components within normal limits  POC OCCULT BLOOD, ED - Abnormal; Notable for the following components:   Fecal Occult Bld POSITIVE (*)    All other components within normal limits  RESP PANEL BY RT-PCR (FLU A&B, COVID) ARPGX2  C DIFFICILE QUICK SCREEN W PCR REFLEX  GASTROINTESTINAL PANEL BY PCR, STOOL (REPLACES STOOL CULTURE)  I-STAT BETA HCG BLOOD, ED ([facility], [facility], [facility] ONLY)  TYPE AND SCREEN    EKG EKG Interpretation  Date/Time:  Monday May 19 2020 11:27:02 EST Ventricular Rate:  91 PR Interval:    QRS Duration: 92 QT Interval:  374 QTC Calculation: 461 R Axis:   42 Text Interpretation: sinus rhythm LVH with secondary repolarization abnormality When compared to prior, slower rate. No STEMI Confirmed by Antony Blackbird [phone]) on 05/19/2020 11:29:29 AM Also confirmed by Antony Blackbird [phone]), editor Hattie Perch [phone])  on 05/19/2020 2:32:28  PM   Radiology DG Chest Port 1 View  Result Date: 05/19/2020  CLINICAL DATA:  History of GI bleed and missed dialysis session EXAM: PORTABLE CHEST 1 VIEW COMPARISON:  10/13/2019 FINDINGS: Cardiac shadow is within normal limits. Aortic calcifications are seen. The lungs are clear bilaterally. No bony abnormality is seen. IMPRESSION: No active disease. Electronically Signed   By: Inez Catalina M.D.   On: 05/19/2020 11:55    Procedures Procedures (including critical care time)  Medications Ordered in ED Medications  pantoprazole (PROTONIX) 80 mg in sodium chloride 0.9 % 100 mL (0.8 mg/mL) infusion (8 mg/hr Intravenous Infusion Verify 05/19/20 1411)  sodium chloride 0.9 % bolus 500 mL (0 mLs Intravenous Stopped 05/19/20 1411)  pantoprazole (PROTONIX) 80 mg in sodium chloride 0.9 % 100 mL IVPB (0 mg Intravenous Stopped 05/19/20 1349)    ED Course  I have reviewed the triage vital signs and the nursing notes.  Pertinent labs & imaging results that were available during my care of the patient were reviewed by me and considered in my medical decision making (see chart for details).  Patient seen and examined. Work-up initiated. Medications ordered. Pt is going to be difficult to obtain IV access.   Vital signs reviewed and are as follows: BP (!) 88/71 (BP Location: Right Arm)   Pulse 100   Temp 97.8 F (36.6 C) (Oral)   Resp 18   Ht 5\' 7"  (1.702 m)   Wt 71.7 kg   SpO2 100%   BMI 24.75 kg/m   Patient has a small peripheral IV placed by RN.  She has received IV Protonix.  Blood was unable to be successfully obtained.  Patient discussed with and seen by Dr. Sherry Ruffing.  He was able to start IV in the right upper extremity under ultrasound guidance.  Blood was obtained and is pending.  Signout to [employer] at shift change.  Patient will need admitted to the hospital when labs result.  Last seen by [facility].  Will need routine dialysis. Admission to unassigned medicine [facility]).   CRITICAL CARE Performed by: Carlisle Cater PA-C Total critical care time: 40 minutes Critical care time was exclusive of separately billable procedures and treating other patients. Critical care was necessary to treat or prevent imminent or life-threatening deterioration. Critical care was time spent personally by me on the following activities: development of treatment plan with patient and/or surrogate as well as nursing, discussions with consultants, evaluation of patient's response to treatment, examination of patient, obtaining history from patient or surrogate, ordering and performing treatments and interventions, ordering and review of laboratory studies, ordering and review of radiographic studies, pulse oximetry and re-evaluation of patient's condition.     MDM Rules/Calculators/A&P                          Admit when labs result.   Final Clinical Impression(s) / ED Diagnoses Final diagnoses:  Upper GI bleeding  ESRD on dialysis [facility])    Rx / DC Orders ED Discharge Orders    None       Carlisle Cater, PA-C 05/19/20 1511    Tegeler, Gwenyth Allegra, MD 05/20/20 1728

## 2020-05-19 NOTE — ED Notes (Signed)
Phlebotomy made aware to not draw blood specimens at this time and to only draw blood if this RN specifies as such.

## 2020-05-19 NOTE — ED Provider Notes (Addendum)
Received signout from previous provider, please see his note for complete H&P.  This is a 59 year old female significant history of end-stage renal disease currently on T/Th/S dialysis who have missed dialysis for approximately 1 week, also history of gastric ulcer and duodenitis on pantoprazole  presenting with GI bleed.  Patient report persistent GI bleeding with dark tarry stool for the past 3 days.  No complaint of abdominal pain but does complain of generalized fatigue.  Initial notes listed her as being on Plavix but patient denies taking Plavix.  She does have fecal occult blood test positive.  She was found to be hypotensive and tachycardic on exam but does not appear to be fluid overloaded.  Her chest x-ray is without any acute finding, Covid test negative, EKG without peaked T waves.  4:52 PM Unfortunately her labs has been hemolyzed and result has been inaccurate.  A CBG obtained showing 50, will give her juice and crackers and will recheck.  5:31 PM After drinking 2 OJ and monitored for 30 minutes, repeat CBG is 50.  Will give an amp of D50.   5:50 PM Initial potassium is 2.4.  A repeat potassium was obtained showing 2.1.  Will give supplementation.  Her hemoglobin is 7.6 which is near her baseline however patient is hypotensive and tachycardic therefore she will receive 1 unit of packed red blood cell.  Since patient has black tarry stool as well as positive fecal occult blood test, will consult [place] gastroenterology.  3:00 PM Systolic blood pressure is now 64 on recheck.  Patient is likely hypovolemic especially in the setting of lack of p.o. intake, and upper GI bleed.  She does not appear to be fluid overload.  We will give 500 cc of normal saline while waiting for blood product.  Care discussed with Dr. Regenia Skeeter   6:45 PM Appreciate consultation from GI specialist Dr. Silvio Pate who plan to scope pt tomorrow, however if pt not responding to blood product and IVF then to contact him  for close management.   I have also notify nephrology Dr. Johnney Ou who will have pt on their list for dialysis as needed.   7:57 PM Appreciate consultation from [facility] Dr. Roel Cluck who felt pt is too unstable for regular floor and request consultation to our intensivivist.  Pt currently receiving IVF and awaits blood product, however repeat BP is in the 76A systolic.  I have consulted PCCM intensivist Dr. Earlie Server who agrees to see and admit pt for further management of her symptomatic anemia 2/2 UGIB.    8:21 PM Critical care specialist Dr. Earlie Server and PA Mickel Baas Gleason have evaluated pt.  They felt pt will likely improve with blood product and IVF.  At this time, we'll watchfully wait and monitor pt and see how she's responding to blood transfusion and IVF.  If no improvement, then PCCM will be willing to admit.  If improve, then pt can be admit by medicine.    9:52 PM BP improved.  [facility] dr. Roel Cluck will admit pt  CRITICAL CARE Performed by: Domenic Moras Total critical care time: 70 minutes Critical care time was exclusive of separately billable procedures and treating other patients. Critical care was necessary to treat or prevent imminent or life-threatening deterioration. Critical care was time spent personally by me on the following activities: development of treatment plan with patient and/or surrogate as well as nursing, discussions with consultants, evaluation of patient's response to treatment, examination of patient, obtaining history from patient or surrogate, ordering  and performing treatments and interventions, ordering and review of laboratory studies, ordering and review of radiographic studies, pulse oximetry and re-evaluation of patient's condition.  BP (!) 63/46   Pulse (!) 105   Temp 98.3 F (36.8 C) (Oral)   Resp 20   Ht 5\' 7"  (1.702 m)   Wt 71.7 kg   SpO2 97%   BMI 24.75 kg/m   Results for orders placed or performed during the hospital encounter  of 05/19/20  Resp Panel by RT-PCR (Flu A&B, Covid) Nasopharyngeal Swab   Specimen: Nasopharyngeal Swab; Nasopharyngeal(NP) swabs in vial transport medium  Result Value Ref Range   SARS Coronavirus 2 by RT PCR NEGATIVE NEGATIVE   Influenza A by PCR NEGATIVE NEGATIVE   Influenza B by PCR NEGATIVE NEGATIVE  C Difficile Quick Screen w PCR reflex   Specimen: STOOL  Result Value Ref Range   C Diff antigen POSITIVE (A) NEGATIVE   C Diff toxin NEGATIVE NEGATIVE   C Diff interpretation Results are indeterminate. See PCR results.   C. Diff by PCR, Reflexed  Result Value Ref Range   Toxigenic C. Difficile by PCR NEGATIVE NEGATIVE  Protime-INR  Result Value Ref Range   Prothrombin Time 19.6 (H) 11.4 - 15.2 seconds   INR 1.7 (H) 0.8 - 1.2  Comprehensive metabolic panel  Result Value Ref Range   Sodium 141 135 - 145 mmol/L   Potassium 2.4 (LL) 3.5 - 5.1 mmol/L   Chloride 109 98 - 111 mmol/L   CO2 13 (L) 22 - 32 mmol/L   Glucose, Bld 53 (L) 70 - 99 mg/dL   BUN 31 (H) 6 - 20 mg/dL   Creatinine, Ser 9.69 (H) 0.44 - 1.00 mg/dL   Calcium 6.7 (L) 8.9 - 10.3 mg/dL   Total Protein 3.9 (L) 6.5 - 8.1 g/dL   Albumin 1.5 (L) 3.5 - 5.0 g/dL   AST 28 15 - 41 U/L   ALT 14 0 - 44 U/L   Alkaline Phosphatase 57 38 - 126 U/L   Total Bilirubin 1.0 0.3 - 1.2 mg/dL   GFR, Estimated 4 (L) >60 mL/min   Anion gap 19 (H) 5 - 15  Potassium  Result Value Ref Range   Potassium 2.1 (LL) 3.5 - 5.1 mmol/L  CBC with Differential  Result Value Ref Range   WBC 8.1 4.0 - 10.5 K/uL   RBC 2.74 (L) 3.87 - 5.11 MIL/uL   Hemoglobin 7.6 (L) 12.0 - 15.0 g/dL   HCT 22.8 (L) 36.0 - 46.0 %   MCV 83.2 80.0 - 100.0 fL   MCH 27.7 26.0 - 34.0 pg   MCHC 33.3 30.0 - 36.0 g/dL   RDW 16.5 (H) 11.5 - 15.5 %   Platelets 87 (L) 150 - 400 K/uL   nRBC 0.0 0.0 - 0.2 %   Neutrophils Relative % 77 %   Neutro Abs 6.3 1.7 - 7.7 K/uL   Lymphocytes Relative 16 %   Lymphs Abs 1.3 0.7 - 4.0 K/uL   Monocytes Relative 6 %   Monocytes  Absolute 0.5 0.1 - 1.0 K/uL   Eosinophils Relative 0 %   Eosinophils Absolute 0.0 0.0 - 0.5 K/uL   Basophils Relative 0 %   Basophils Absolute 0.0 0.0 - 0.1 K/uL   Immature Granulocytes 1 %   Abs Immature Granulocytes 0.05 0.00 - 0.07 K/uL  Magnesium  Result Value Ref Range   Magnesium 1.9 1.7 - 2.4 mg/dL  Phosphorus  Result Value Ref Range  Phosphorus 7.2 (H) 2.5 - 4.6 mg/dL  POC occult blood, ED  Result Value Ref Range   Fecal Occult Bld POSITIVE (A) NEGATIVE  CBG monitoring, ED  Result Value Ref Range   Glucose-Capillary 50 (L) 70 - 99 mg/dL  CBG monitoring, ED  Result Value Ref Range   Glucose-Capillary 50 (L) 70 - 99 mg/dL  CBG monitoring, ED  Result Value Ref Range   Glucose-Capillary 100 (H) 70 - 99 mg/dL  Type and screen Ordered by PROVIDER DEFAULT  Result Value Ref Range   ABO/RH(D) O POS    Antibody Screen NEG    Sample Expiration      05/22/2020,2359 Performed at [facility] [address], [place], [place] [address]    Unit Number [id number]    Blood Component Type RED CELLS,LR    Unit division 00    Status of Unit ISSUED    Transfusion Status OK TO TRANSFUSE    Crossmatch Result Compatible    Unit Number [id number]    Blood Component Type RED CELLS,LR    Unit division 00    Status of Unit ALLOCATED    Transfusion Status OK TO TRANSFUSE    Crossmatch Result Compatible   Prepare RBC (crossmatch)  Result Value Ref Range   Order Confirmation      ORDER PROCESSED BY BLOOD BANK Performed at [facility], [address] [place], [place] [address]   Prepare RBC (crossmatch)  Result Value Ref Range   Order Confirmation      ORDER PROCESSED BY BLOOD BANK Performed at [facility], [place] [address] [place],  [address]   BPAM [facility]  Result Value Ref Range   ISSUE DATE / TIME 937902409735    Blood Product Unit Number [id number]    PRODUCT CODE [id number]    Unit Type and Rh 5100    Blood Product Expiration Date  921194174081    ISSUE DATE / TIME [id number]    Blood Product Unit Number [id number]    PRODUCT CODE X4128N86    Unit Type and Rh 5100    Blood Product Expiration Date 767209470962    [facility] Chest Port 1 View  Result Date: 05/19/2020 CLINICAL DATA:  History of GI bleed and missed dialysis session EXAM: PORTABLE CHEST 1 VIEW COMPARISON:  10/13/2019 FINDINGS: Cardiac shadow is within normal limits. Aortic calcifications are seen. The lungs are clear bilaterally. No bony abnormality is seen. IMPRESSION: No active disease. Electronically Signed   By: Inez Catalina M.D.   On: 05/19/2020 11:55       Domenic Moras, PA-C 05/19/20 2001    Domenic Moras, PA-C 05/19/20 2152    Sherwood Gambler, MD 05/21/20 Einar Crow

## 2020-05-19 NOTE — ED Notes (Signed)
Blood Transfusion rate adjusted per ICU Team. RN will continue to monitor.

## 2020-05-19 NOTE — Consult Note (Signed)
NAME:  Karen Morrow, MRN:  [id number], DOB:  1960-10-01, LOS: 0 ADMISSION DATE:  05/19/2020, CONSULTATION DATE:  05/19/20 REFERRING MD:  EDP, CHIEF COMPLAINT:  GIB   Brief History:  59 year-old female with a history of ESRD, asthma, gastric ulcers and GERD, atrial fibrillation not on anticoagulation, CHF, hep C and hypertension who presented with dark stools and diarrhea for the last 3 days.  Stool was heme positive and hemoglobin 7.6 with hypotension, C. difficile also positive.  PCCM consulted for hypotension.  History of Present Illness:  Karen Morrow is a 59 year old female with past medical history  ESRD, asthma, gastric ulcers and GERD, atrial fibrillation not on anticoagulation, CHF, hep C and hypertension who presented with dark stools and diarrhea for the last 3 days.  Patient has a history of upper GI bleed and had an EGD 09/2019 showing cratered gastric ulcer and duodenitis, on PPI at home.  Patient has not been feeling well for the last week and has missed dialysis, she has had dark diarrhea and fatigue.  Denies abdominal pain, vomiting, fever, chest pain  In the ED, patient was hypotensive initially 89/64, she was given 500 cc bolus.  Hemoglobin was 7.6, 1 unit PRBCs just initiated at the time of consult.  Lab results significant for hypokalemia of 2.1.  INR 1.7, positive C. difficile antigen without leukocytosis.  Blood pressure remained low so PCCM was consulted  Past Medical History:   has a past medical history of Anginal pain ([place]), Asthma, Atrial fibrillation ([place]), CHF (congestive heart failure) ([place]), Chronic kidney disease, Dyspnea, Frequent bowel movements, GERD (gastroesophageal reflux disease), Hepatitis C antibody test positive, and Hypertension.   Significant Hospital Events:  12/20-admit  Consults:  PCCM  Procedures:    Significant Diagnostic Tests:  12/20 CXR>> no acute findings  Micro Data:  12/20 C. Difficile>> positive 12/20 Covid-19 and flu>>  negative  Antimicrobials:    Interim History / Subjective:  Patient awake and alert, feels tired and weak  Objective   Blood pressure (!) 63/46, pulse (!) 105, temperature 98.3 F (36.8 C), temperature source Oral, resp. rate 20, height 5\' 7"  (1.702 m), weight 71.7 kg, SpO2 97 %.        Intake/Output Summary (Last 24 hours) at 05/19/2020 2036 Last data filed at 05/19/2020 9326 Gross per 24 hour  Intake 1148.12 ml  Output --  Net 1148.12 ml   Filed Weights   05/19/20 1034  Weight: 71.7 kg   General: Cachectic chronically ill-appearing female in no acute distress HEENT: MM pink/dry Neuro: Awake and alert, moving all extremities and following commands CV: s1s2 tachycardic, no m/r/g PULM: Clear bilaterally without tachypnea or accessory muscle use GI: soft, bsx4 active, nontender, nondistended Extremities: warm/dry, no edema  Skin: no rashes or lesions, extremely dry tenting skin   Resolved Hospital Problem list     Assessment & Plan:   Likely Upper GI bleed with ABLA in the setting of acute, C. difficile infection and ESRD Hemoglobin 7.6, suspect this slow upper GI bleed secondary to known ulcer disease P: -Currently getting first unit PRBCs, appears extremely volume depleted despite missing dialysis, likely from large volume diarrhea and C. difficile infection -MAP is 55, however has only received 500cc bolus and not completed first unit, will transfuse and give additional 500 cc if remains hypotensive.  Suspect that blood pressure will improve with volume, she has no signs of pulmonary edema or volume overload currently.  We will hold off admitting to the ICU  for now, but if patient remains hypotensive and requires pressors or GI bleed becomes more brisk please contact PCCM for ICU admission. -Treatment of C. difficile and hypokalemia per primary team     Best practice (evaluated daily)  Diet: N.p.o. Pain/Anxiety/Delirium protocol (if indicated): N/A VAP protocol  (if indicated): N/A DVT prophylaxis: SCDs GI prophylaxis: PPI Glucose control: N/A Mobility: Bedrest Disposition: Recommend progressive care  Goals of Care:  Last date of multidisciplinary goals of care discussion: N/A Family and staff present: N/A Summary of discussion:  Follow up goals of care discussion due:  Code Status: Full code  Labs   CBC: Recent Labs  Lab 05/19/20 1655  WBC 8.1  NEUTROABS 6.3  HGB 7.6*  HCT 22.8*  MCV 83.2  PLT 87*    Basic Metabolic Panel: Recent Labs  Lab 05/19/20 1336 05/19/20 1655 05/19/20 1748  NA 141  --   --   K 2.4* 2.1*  --   CL 109  --   --   CO2 13*  --   --   GLUCOSE 53*  --   --   BUN 31*  --   --   CREATININE 9.69*  --   --   CALCIUM 6.7*  --   --   MG  --   --  1.9  PHOS  --   --  7.2*   GFR: Estimated Creatinine Clearance: 6 mL/min (A) (by C-G formula based on SCr of 9.69 mg/dL (H)). Recent Labs  Lab 05/19/20 1655  WBC 8.1    Liver Function Tests: Recent Labs  Lab 05/19/20 1336  AST 28  ALT 14  ALKPHOS 57  BILITOT 1.0  PROT 3.9*  ALBUMIN 1.5*   No results for input(s): LIPASE, AMYLASE in the last 168 hours. No results for input(s): AMMONIA in the last 168 hours.  ABG    Component Value Date/Time   PHART 7.265 (L) 03/10/2018 1301   PCO2ART 45.6 03/10/2018 1301   PO2ART 225 (H) 03/10/2018 1301   HCO3 21.3 11/27/2018 0408   TCO2 26 01/21/2020 0747   ACIDBASEDEF 6.0 (H) 11/27/2018 0408   O2SAT 77.0 11/27/2018 0408     Coagulation Profile: Recent Labs  Lab 05/19/20 1336  INR 1.7*    Cardiac Enzymes: No results for input(s): CKTOTAL, CKMB, CKMBINDEX, TROPONINI in the last 168 hours.  HbA1C: Hgb A1c MFr Bld  Date/Time Value Ref Range Status  11/26/2013 08:05 PM 4.7 <5.7 % Final    Comment:    (NOTE)                                                                       According to the ADA Clinical Practice Recommendations for 2011, when HbA1c is used as a screening test:  >=6.5%    Diagnostic of Diabetes Mellitus           (if abnormal result is confirmed) 5.7-6.4%   Increased risk of developing Diabetes Mellitus References:Diagnosis and Classification of Diabetes Mellitus,Diabetes RDEY,8144,81(EHUDJ 1):S62-S69 and Standards of Medical Care in         Diabetes - 2011,Diabetes SHFW,2637,85 (Suppl 1):S11-S61.    CBG: Recent Labs  Lab 05/19/20 1649 05/19/20 1730 05/19/20 1929  GLUCAP 50* 50* 100*  Review of Systems:   Negative except as noted in HPI  Past Medical History:  She,  has a past medical history of Anginal pain ([place]), Asthma, Atrial fibrillation ([place]), CHF (congestive heart failure) ([place]), Chronic kidney disease, Dyspnea, Frequent bowel movements, GERD (gastroesophageal reflux disease), Hepatitis C antibody test positive, and Hypertension.   Surgical History:   Past Surgical History:  Procedure Laterality Date  . ABDOMINAL AORTOGRAM W/LOWER EXTREMITY Bilateral 01/21/2020   Procedure: ABDOMINAL AORTOGRAM W/LOWER EXTREMITY;  Surgeon: Waynetta Sandy, MD;  Location: [place] [facility];  Service: Cardiovascular;  Laterality: Bilateral;  . ABDOMINAL HYSTERECTOMY     "partial" (11/03/2012)  . AV FISTULA PLACEMENT Right 12/03/2013   Procedure: CREATION OF ARTERIOVENOUS (AV) FISTULA RIGHT ARM ;  Surgeon: Rosetta Posner, MD;  Location: [place];  Service: Vascular;  Laterality: Right;  . AV FISTULA PLACEMENT Left 05/17/2018   Procedure: CREATION OF BRACHIOCEPHALIC FISTULA LEFT ARM;  Surgeon: Waynetta Sandy, MD;  Location: [place];  Service: Vascular;  Laterality: Left;  . [place] REMOVAL Right 03/16/2018   Procedure: REMOVAL OF ARTERIOVENOUS GORETEX GRAFT ([place]) RIGHT ARM;  Surgeon: Waynetta Sandy, MD;  Location: [place];  Service: Vascular;  Laterality: Right;  . BASCILIC VEIN TRANSPOSITION Right 03/06/2014   Procedure: SECOND STAGE BASILIC VEIN TRANSPOSITION;  Surgeon: Rosetta Posner, MD;  Location: [place];  Service: Vascular;  Laterality:  Right;  . BIOPSY  09/30/2018   Procedure: BIOPSY;  Surgeon: Mauri Pole, MD;  Location: [facility] ENDOSCOPY;  Service: Endoscopy;;  . BIOPSY  08/01/2019   Procedure: BIOPSY;  Surgeon: Ronald Lobo, MD;  Location: [facility] ENDOSCOPY;  Service: Endoscopy;;  . BIOPSY  10/14/2019   Procedure: BIOPSY;  Surgeon: Doran Stabler, MD;  Location: [facility] ENDOSCOPY;  Service: Gastroenterology;;  . ENTEROSCOPY N/A 10/06/2018   Procedure: ENTEROSCOPY;  Surgeon: Lavena Bullion, DO;  Location: [place];  Service: Gastroenterology;  Laterality: N/A;  . ESOPHAGOGASTRODUODENOSCOPY N/A 06/05/2013   Procedure: ESOPHAGOGASTRODUODENOSCOPY (EGD);  Surgeon: Winfield Cunas., MD;  Location: [facility];  Service: Endoscopy;  Laterality: N/A;  . ESOPHAGOGASTRODUODENOSCOPY N/A 09/08/2016   Procedure: ESOPHAGOGASTRODUODENOSCOPY (EGD);  Surgeon: Milus Banister, MD;  Location: [place];  Service: Endoscopy;  Laterality: N/A;  . ESOPHAGOGASTRODUODENOSCOPY (EGD) WITH PROPOFOL N/A 09/17/2018   Procedure: ESOPHAGOGASTRODUODENOSCOPY (EGD) WITH PROPOFOL;  Surgeon: Carol Ada, MD;  Location: [place];  Service: Endoscopy;  Laterality: N/A;  . ESOPHAGOGASTRODUODENOSCOPY (EGD) WITH PROPOFOL N/A 09/30/2018   Procedure: ESOPHAGOGASTRODUODENOSCOPY (EGD) WITH PROPOFOL;  Surgeon: Mauri Pole, MD;  Location: [facility]OSCOPY;  Service: Endoscopy;  Laterality: N/A;  . ESOPHAGOGASTRODUODENOSCOPY (EGD) WITH PROPOFOL N/A 08/01/2019   Procedure: ESOPHAGOGASTRODUODENOSCOPY (EGD) WITH PROPOFOL;  Surgeon: Ronald Lobo, MD;  Location: [place];  Service: Endoscopy;  Laterality: N/A;  . ESOPHAGOGASTRODUODENOSCOPY (EGD) WITH PROPOFOL N/A 10/14/2019   Procedure: ESOPHAGOGASTRODUODENOSCOPY (EGD) WITH PROPOFOL;  Surgeon: Doran Stabler, MD;  Location: [place];  Service: Gastroenterology;  Laterality: N/A;  . INSERTION OF DIALYSIS CATHETER Left 03/16/2018   Procedure: PLACEMENT OF TUNNEL DIALYSIS CATHETER;  Surgeon: Waynetta Sandy, MD;  Location: [place];  Service: Vascular;  Laterality: Left;  . INSERTION OF DIALYSIS CATHETER Right 09/26/2018   Procedure: INSERTION OF DIALYSIS  TUNNELED CATHETER;  Surgeon: Elam Dutch, MD;  Location: [facility];  Service: Vascular;  Laterality: Right;  . IR REMOVAL TUN CV CATH W/O FL  09/17/2018  . PERIPHERAL VASCULAR INTERVENTION  01/21/2020   Procedure: PERIPHERAL VASCULAR INTERVENTION;  Surgeon:  Waynetta Sandy, MD;  Location: [place] [facility];  Service: Cardiovascular;;  left SFA  . REVISON OF ARTERIOVENOUS FISTULA Right 01/28/2017   Procedure: REVISON OF RIGHT ARM ARTERIOVENOUS FISTULA USING 8MMX10CM GORETEX GRAFT;  Surgeon: Angelia Mould, MD;  Location: [place];  Service: Vascular;  Laterality: Right;     Social History:   reports that she has been smoking cigarettes. She has a 20.00 pack-year smoking history. She has never used smokeless tobacco. She reports previous alcohol use. She reports previous drug use. Drug: Marijuana.   Family History:  Her family history includes Diabetes in her father; Heart attack (age of onset: 54) in her father; Lupus in her mother.   Allergies Allergies  Allergen Reactions  . Cefazolin Other (See Comments)    Severe thrombocytopenia (has tolerated zosyn in the past) ID aware Tolerated in October 2019  . Cephalosporins Anaphylaxis  . Tuberculin Swelling and Other (See Comments)    Site of swelling not known     Home Medications  Prior to Admission medications   Medication Sig Start Date End Date Taking? Authorizing Provider  albuterol (PROVENTIL HFA;VENTOLIN HFA) 108 (90 Base) MCG/ACT inhaler TAKE 2 PUFFS BY MOUTH EVERY 6 HOURS AS NEEDED FOR WHEEZE OR SHORTNESS OF BREATH Patient taking differently: Inhale 2 puffs into the lungs every 6 (six) hours as needed for wheezing or shortness of breath. 05/09/18   [name], MD  B Complex-C-Zn-Folic Acid (DIALYVITE 341 WITH ZINC) 0.8 MG TABS Take 1 tablet  by mouth daily.    [provider]  b complex-vitamin c-folic acid (NEPHRO-VITE) 0.8 MG TABS tablet Take 1 tablet by mouth every evening. Patient not taking: Reported on 05/19/2020 11/16/19   [name], MD  calcium acetate (PHOSLO) 667 MG capsule Take 2 capsules (1,334 mg total) by mouth 3 (three) times daily. 11/16/19   [name], MD  cinacalcet (SENSIPAR) 30 MG tablet TAKE 1 TABLET BY MOUTH DAILY EVERY EVENING (DO NOT TAKE LESS THAN 12 HOURS PRIOR TO DIALYSIS) Patient taking differently: Take 30 mg by mouth See admin instructions. Take 30 mg by mouth in the evening and NOT LESS THAN 12 HOURS PRIOR TO DIALYSIS 11/16/19   [name], MD  clopidogrel (PLAVIX) 75 MG tablet Take 1 tablet (75 mg total) by mouth daily. 02/06/20 02/05/21  [name], MD  ferric citrate (AURYXIA) 1 GM 210 MG(Fe) tablet Take 420 mg by mouth 3 (three) times daily with meals.    [provider]  lidocaine-prilocaine (EMLA) cream Apply 1 application topically See admin instructions. Apply as directed before dialysis 09/25/19   [provider]  metoprolol tartrate (LOPRESSOR) 25 MG tablet Take 0.5 tablets (12.5 mg total) by mouth 2 (two) times daily. 11/16/19   [name], MD  mirtazapine (REMERON) 7.5 MG tablet Take 7.5 mg by mouth at bedtime. 12/18/19   [provider]  nicotine (NICODERM CQ - DOSED IN MG/24 HOURS) 21 mg/24hr patch Place 1 patch (21 mg total) onto the skin daily. 11/16/19   [name], MD  pantoprazole (PROTONIX) 40 MG tablet Take 1 tablet (40 mg total) by mouth 2 (two) times daily before a meal. for 2 months, then once daily 11/16/19   [name], MD  rosuvastatin (CRESTOR) 10 MG tablet Take 1 tablet (10 mg total) by mouth daily. 01/21/20 01/20/21  Waynetta Sandy, MD  triamcinolone cream (KENALOG) 0.1 % Apply 1 application topically 2 (two) times daily as needed.  To right arm for itching Patient taking differently: Apply  1 application topically 2 (two) times daily as needed (to right arm, for itching). 11/16/19   [name], MD     Critical care time: 40 minutes      CRITICAL CARE Performed by: Otilio Carpen Ronal Maybury   Total critical care time: 40 minutes  Critical care time was exclusive of separately billable procedures and treating other patients.  Critical care was necessary to treat or prevent imminent or life-threatening deterioration.  Critical care was time spent personally by me on the following activities: development of treatment plan with patient and/or surrogate as well as nursing, discussions with consultants, evaluation of patient's response to treatment, examination of patient, obtaining history from patient or surrogate, ordering and performing treatments and interventions, ordering and review of laboratory studies, ordering and review of radiographic studies, pulse oximetry and re-evaluation of patient's condition.   Otilio Carpen Choya Tornow, PA-C [place] [facility]  Pager# [phone], if no answer [phone]

## 2020-05-19 NOTE — ED Notes (Signed)
IV team at bedside 

## 2020-05-20 DIAGNOSIS — K7460 Unspecified cirrhosis of liver: Secondary | ICD-10-CM | POA: Diagnosis not present

## 2020-05-20 DIAGNOSIS — K254 Chronic or unspecified gastric ulcer with hemorrhage: Secondary | ICD-10-CM | POA: Diagnosis not present

## 2020-05-20 DIAGNOSIS — R197 Diarrhea, unspecified: Secondary | ICD-10-CM | POA: Diagnosis not present

## 2020-05-20 DIAGNOSIS — D649 Anemia, unspecified: Secondary | ICD-10-CM | POA: Diagnosis not present

## 2020-05-20 DIAGNOSIS — I4891 Unspecified atrial fibrillation: Secondary | ICD-10-CM | POA: Diagnosis not present

## 2020-05-20 DIAGNOSIS — Z7401 Bed confinement status: Secondary | ICD-10-CM | POA: Diagnosis not present

## 2020-05-20 DIAGNOSIS — R64 Cachexia: Secondary | ICD-10-CM | POA: Diagnosis present

## 2020-05-20 DIAGNOSIS — Z7189 Other specified counseling: Secondary | ICD-10-CM | POA: Diagnosis not present

## 2020-05-20 DIAGNOSIS — K5710 Diverticulosis of small intestine without perforation or abscess without bleeding: Secondary | ICD-10-CM | POA: Diagnosis not present

## 2020-05-20 DIAGNOSIS — K2980 Duodenitis without bleeding: Secondary | ICD-10-CM | POA: Diagnosis not present

## 2020-05-20 DIAGNOSIS — L89211 Pressure ulcer of right hip, stage 1: Secondary | ICD-10-CM | POA: Diagnosis not present

## 2020-05-20 DIAGNOSIS — K222 Esophageal obstruction: Secondary | ICD-10-CM | POA: Diagnosis not present

## 2020-05-20 DIAGNOSIS — I700 Atherosclerosis of aorta: Secondary | ICD-10-CM | POA: Diagnosis not present

## 2020-05-20 DIAGNOSIS — R579 Shock, unspecified: Secondary | ICD-10-CM | POA: Diagnosis not present

## 2020-05-20 DIAGNOSIS — N179 Acute kidney failure, unspecified: Secondary | ICD-10-CM

## 2020-05-20 DIAGNOSIS — E162 Hypoglycemia, unspecified: Secondary | ICD-10-CM | POA: Diagnosis not present

## 2020-05-20 DIAGNOSIS — I358 Other nonrheumatic aortic valve disorders: Secondary | ICD-10-CM | POA: Diagnosis not present

## 2020-05-20 DIAGNOSIS — R188 Other ascites: Secondary | ICD-10-CM | POA: Diagnosis present

## 2020-05-20 DIAGNOSIS — K922 Gastrointestinal hemorrhage, unspecified: Secondary | ICD-10-CM | POA: Diagnosis present

## 2020-05-20 DIAGNOSIS — E872 Acidosis: Secondary | ICD-10-CM | POA: Diagnosis present

## 2020-05-20 DIAGNOSIS — Z992 Dependence on renal dialysis: Secondary | ICD-10-CM

## 2020-05-20 DIAGNOSIS — N186 End stage renal disease: Secondary | ICD-10-CM | POA: Diagnosis present

## 2020-05-20 DIAGNOSIS — K2990 Gastroduodenitis, unspecified, without bleeding: Secondary | ICD-10-CM | POA: Diagnosis not present

## 2020-05-20 DIAGNOSIS — D696 Thrombocytopenia, unspecified: Secondary | ICD-10-CM | POA: Diagnosis present

## 2020-05-20 DIAGNOSIS — Z515 Encounter for palliative care: Secondary | ICD-10-CM | POA: Diagnosis not present

## 2020-05-20 DIAGNOSIS — R578 Other shock: Secondary | ICD-10-CM | POA: Diagnosis present

## 2020-05-20 DIAGNOSIS — R627 Adult failure to thrive: Secondary | ICD-10-CM | POA: Diagnosis present

## 2020-05-20 DIAGNOSIS — D689 Coagulation defect, unspecified: Secondary | ICD-10-CM | POA: Diagnosis present

## 2020-05-20 DIAGNOSIS — Z66 Do not resuscitate: Secondary | ICD-10-CM | POA: Diagnosis not present

## 2020-05-20 DIAGNOSIS — K2970 Gastritis, unspecified, without bleeding: Secondary | ICD-10-CM | POA: Diagnosis not present

## 2020-05-20 DIAGNOSIS — K3189 Other diseases of stomach and duodenum: Secondary | ICD-10-CM | POA: Diagnosis not present

## 2020-05-20 DIAGNOSIS — I959 Hypotension, unspecified: Secondary | ICD-10-CM | POA: Diagnosis not present

## 2020-05-20 DIAGNOSIS — K257 Chronic gastric ulcer without hemorrhage or perforation: Secondary | ICD-10-CM | POA: Diagnosis not present

## 2020-05-20 DIAGNOSIS — I5030 Unspecified diastolic (congestive) heart failure: Secondary | ICD-10-CM | POA: Diagnosis not present

## 2020-05-20 DIAGNOSIS — N2581 Secondary hyperparathyroidism of renal origin: Secondary | ICD-10-CM | POA: Diagnosis present

## 2020-05-20 DIAGNOSIS — R4182 Altered mental status, unspecified: Secondary | ICD-10-CM | POA: Diagnosis not present

## 2020-05-20 DIAGNOSIS — K219 Gastro-esophageal reflux disease without esophagitis: Secondary | ICD-10-CM | POA: Diagnosis not present

## 2020-05-20 DIAGNOSIS — E43 Unspecified severe protein-calorie malnutrition: Secondary | ICD-10-CM | POA: Diagnosis present

## 2020-05-20 DIAGNOSIS — R0602 Shortness of breath: Secondary | ICD-10-CM | POA: Diagnosis not present

## 2020-05-20 DIAGNOSIS — E871 Hypo-osmolality and hyponatremia: Secondary | ICD-10-CM | POA: Diagnosis not present

## 2020-05-20 DIAGNOSIS — I739 Peripheral vascular disease, unspecified: Secondary | ICD-10-CM | POA: Diagnosis not present

## 2020-05-20 DIAGNOSIS — G9349 Other encephalopathy: Secondary | ICD-10-CM | POA: Diagnosis not present

## 2020-05-20 DIAGNOSIS — R404 Transient alteration of awareness: Secondary | ICD-10-CM | POA: Diagnosis not present

## 2020-05-20 DIAGNOSIS — B1920 Unspecified viral hepatitis C without hepatic coma: Secondary | ICD-10-CM | POA: Diagnosis not present

## 2020-05-20 DIAGNOSIS — F17210 Nicotine dependence, cigarettes, uncomplicated: Secondary | ICD-10-CM | POA: Diagnosis not present

## 2020-05-20 DIAGNOSIS — I2722 Pulmonary hypertension due to left heart disease: Secondary | ICD-10-CM | POA: Diagnosis present

## 2020-05-20 DIAGNOSIS — I132 Hypertensive heart and chronic kidney disease with heart failure and with stage 5 chronic kidney disease, or end stage renal disease: Secondary | ICD-10-CM | POA: Diagnosis present

## 2020-05-20 DIAGNOSIS — Z741 Need for assistance with personal care: Secondary | ICD-10-CM | POA: Diagnosis not present

## 2020-05-20 DIAGNOSIS — D62 Acute posthemorrhagic anemia: Secondary | ICD-10-CM | POA: Diagnosis present

## 2020-05-20 DIAGNOSIS — Z6824 Body mass index (BMI) 24.0-24.9, adult: Secondary | ICD-10-CM | POA: Diagnosis not present

## 2020-05-20 DIAGNOSIS — M2550 Pain in unspecified joint: Secondary | ICD-10-CM | POA: Diagnosis not present

## 2020-05-20 DIAGNOSIS — J9811 Atelectasis: Secondary | ICD-10-CM | POA: Diagnosis not present

## 2020-05-20 DIAGNOSIS — E876 Hypokalemia: Secondary | ICD-10-CM | POA: Diagnosis not present

## 2020-05-20 DIAGNOSIS — Z681 Body mass index (BMI) 19 or less, adult: Secondary | ICD-10-CM | POA: Diagnosis not present

## 2020-05-20 DIAGNOSIS — Z20822 Contact with and (suspected) exposure to covid-19: Secondary | ICD-10-CM | POA: Diagnosis present

## 2020-05-20 DIAGNOSIS — I509 Heart failure, unspecified: Secondary | ICD-10-CM | POA: Diagnosis not present

## 2020-05-20 DIAGNOSIS — A0472 Enterocolitis due to Clostridium difficile, not specified as recurrent: Secondary | ICD-10-CM | POA: Diagnosis present

## 2020-05-20 DIAGNOSIS — L89152 Pressure ulcer of sacral region, stage 2: Secondary | ICD-10-CM | POA: Diagnosis not present

## 2020-05-20 DIAGNOSIS — I4821 Permanent atrial fibrillation: Secondary | ICD-10-CM | POA: Diagnosis present

## 2020-05-20 DIAGNOSIS — I5032 Chronic diastolic (congestive) heart failure: Secondary | ICD-10-CM | POA: Diagnosis present

## 2020-05-20 LAB — TYPE AND SCREEN
ABO/RH(D): O POS
Antibody Screen: NEGATIVE
Unit division: 0
Unit division: 0

## 2020-05-20 LAB — BPAM RBC
Blood Product Expiration Date: 202201192359
Blood Product Expiration Date: 202201192359
ISSUE DATE / TIME: 202112201916
ISSUE DATE / TIME: 202112202210
Unit Type and Rh: 5100
Unit Type and Rh: 5100

## 2020-05-20 LAB — I-STAT VENOUS BLOOD GAS, ED
Acid-base deficit: 5 mmol/L — ABNORMAL HIGH (ref 0.0–2.0)
Bicarbonate: 17.9 mmol/L — ABNORMAL LOW (ref 20.0–28.0)
Calcium, Ion: 1.03 mmol/L — ABNORMAL LOW (ref 1.15–1.40)
HCT: 34 % — ABNORMAL LOW (ref 36.0–46.0)
Hemoglobin: 11.6 g/dL — ABNORMAL LOW (ref 12.0–15.0)
O2 Saturation: 94 %
Potassium: 2 mmol/L — CL (ref 3.5–5.1)
Sodium: 140 mmol/L (ref 135–145)
TCO2: 19 mmol/L — ABNORMAL LOW (ref 22–32)
pCO2, Ven: 27.7 mmHg — ABNORMAL LOW (ref 44.0–60.0)
pH, Ven: 7.418 (ref 7.250–7.430)
pO2, Ven: 70 mmHg — ABNORMAL HIGH (ref 32.0–45.0)

## 2020-05-20 LAB — CBC
HCT: 35.3 % — ABNORMAL LOW (ref 36.0–46.0)
Hemoglobin: 11.6 g/dL — ABNORMAL LOW (ref 12.0–15.0)
MCH: 27.6 pg (ref 26.0–34.0)
MCHC: 32.9 g/dL (ref 30.0–36.0)
MCV: 83.8 fL (ref 80.0–100.0)
Platelets: 100 10*3/uL — ABNORMAL LOW (ref 150–400)
RBC: 4.21 MIL/uL (ref 3.87–5.11)
RDW: 16.9 % — ABNORMAL HIGH (ref 11.5–15.5)
WBC: 8.5 10*3/uL (ref 4.0–10.5)
nRBC: 0 % (ref 0.0–0.2)

## 2020-05-20 LAB — GASTROINTESTINAL PANEL BY PCR, STOOL (REPLACES STOOL CULTURE)

## 2020-05-20 LAB — CBG MONITORING, ED
Glucose-Capillary: 108 mg/dL — ABNORMAL HIGH (ref 70–99)
Glucose-Capillary: 73 mg/dL (ref 70–99)
Glucose-Capillary: 74 mg/dL (ref 70–99)
Glucose-Capillary: 82 mg/dL (ref 70–99)
Glucose-Capillary: 95 mg/dL (ref 70–99)

## 2020-05-20 LAB — PHOSPHORUS: Phosphorus: 6.5 mg/dL — ABNORMAL HIGH (ref 2.5–4.6)

## 2020-05-20 LAB — PREALBUMIN: Prealbumin: 8.3 mg/dL — ABNORMAL LOW (ref 18–38)

## 2020-05-20 LAB — CK: Total CK: 207 U/L (ref 38–234)

## 2020-05-20 LAB — AMMONIA: Ammonia: 45 umol/L — ABNORMAL HIGH (ref 9–35)

## 2020-05-20 LAB — GLUCOSE, CAPILLARY
Glucose-Capillary: 103 mg/dL — ABNORMAL HIGH (ref 70–99)
Glucose-Capillary: 78 mg/dL (ref 70–99)
Glucose-Capillary: 98 mg/dL (ref 70–99)
Glucose-Capillary: 100 mg/dL — ABNORMAL HIGH (ref 70–99)

## 2020-05-20 LAB — BASIC METABOLIC PANEL
Anion gap: 19 — ABNORMAL HIGH (ref 5–15)
BUN: 36 mg/dL — ABNORMAL HIGH (ref 6–20)
CO2: 18 mmol/L — ABNORMAL LOW (ref 22–32)
Calcium: 8.3 mg/dL — ABNORMAL LOW (ref 8.9–10.3)
Chloride: 103 mmol/L (ref 98–111)
Creatinine, Ser: 10.97 mg/dL — ABNORMAL HIGH (ref 0.44–1.00)
GFR, Estimated: 4 mL/min — ABNORMAL LOW (ref >60)
Glucose, Bld: 66 mg/dL — ABNORMAL LOW (ref 70–99)
Potassium: 2.4 mmol/L — CL (ref 3.5–5.1)
Sodium: 140 mmol/L (ref 135–145)

## 2020-05-20 LAB — LACTIC ACID, PLASMA: Lactic Acid, Venous: 1.7 mmol/L (ref 0.5–1.9)

## 2020-05-20 LAB — HEMOGLOBIN AND HEMATOCRIT, BLOOD
HCT: 34.4 % — ABNORMAL LOW (ref 36.0–46.0)
Hemoglobin: 11.8 g/dL — ABNORMAL LOW (ref 12.0–15.0)

## 2020-05-20 LAB — MAGNESIUM: Magnesium: 1.8 mg/dL (ref 1.7–2.4)

## 2020-05-20 LAB — LACTATE DEHYDROGENASE: LDH: 195 U/L — ABNORMAL HIGH (ref 98–192)

## 2020-05-20 MED ORDER — HEPARIN SODIUM (PORCINE) 5000 UNIT/ML IJ SOLN: 5000.0000 [IU] | Freq: Three times a day (TID) | INTRAMUSCULAR | Status: DC

## 2020-05-20 MED ORDER — LACTATED RINGERS IV BOLUS
250.0000 mL | Freq: Once | INTRAVENOUS | Status: AC
  Administered 2020-05-20: 250 mL via INTRAVENOUS

## 2020-05-20 MED ORDER — DEXTROSE 10 % IV SOLN: INTRAVENOUS | Status: DC

## 2020-05-20 MED ORDER — MIRTAZAPINE 15 MG PO TABS
7.5000 mg | ORAL_TABLET | Freq: Every day | ORAL | Status: DC
  Administered 2020-05-20 – 2020-05-23 (×4): 7.5 mg via ORAL
  Filled 2020-05-20 (×4): qty 1

## 2020-05-20 MED ORDER — PANTOPRAZOLE SODIUM 40 MG IV SOLR
40.0000 mg | Freq: Four times a day (QID) | INTRAVENOUS | Status: DC
  Filled 2020-05-20: qty 40

## 2020-05-20 MED ORDER — ROSUVASTATIN CALCIUM 5 MG PO TABS
10.0000 mg | ORAL_TABLET | Freq: Every day | ORAL | Status: DC
  Administered 2020-05-20 – 2020-05-23 (×2): 10 mg via ORAL
  Filled 2020-05-20 (×2): qty 2

## 2020-05-20 MED ORDER — ALBUMIN HUMAN 25 % IV SOLN: 25.0000 g | Freq: Once | INTRAVENOUS | Status: AC

## 2020-05-20 MED ORDER — PANTOPRAZOLE SODIUM 40 MG IV SOLR
40.0000 mg | Freq: Two times a day (BID) | INTRAVENOUS | Status: DC
  Administered 2020-05-20 – 2020-05-25 (×11): 40 mg via INTRAVENOUS
  Filled 2020-05-20 (×12): qty 40

## 2020-05-20 MED ORDER — SODIUM CHLORIDE 0.9 % IV SOLN: INTRAVENOUS | Status: AC

## 2020-05-20 MED ORDER — DOCUSATE SODIUM 100 MG PO CAPS: 100.0000 mg | ORAL_CAPSULE | Freq: Two times a day (BID) | ORAL | Status: DC | PRN

## 2020-05-20 MED ORDER — POTASSIUM CHLORIDE 10 MEQ/100ML IV SOLN: 10.0000 meq | INTRAVENOUS | Status: DC

## 2020-05-20 MED ORDER — DOXERCALCIFEROL 4 MCG/2ML IV SOLN
INTRAVENOUS | Status: AC
  Administered 2020-05-20: 2 ug via INTRAVENOUS
  Filled 2020-05-20: qty 2

## 2020-05-20 MED ORDER — ALBUMIN HUMAN 25 % IV SOLN
INTRAVENOUS | Status: AC
  Administered 2020-05-20: 25 g via INTRAVENOUS
  Filled 2020-05-20: qty 100

## 2020-05-20 MED ORDER — ONDANSETRON HCL 4 MG/2ML IJ SOLN
4.0000 mg | Freq: Four times a day (QID) | INTRAMUSCULAR | Status: DC | PRN
  Administered 2020-05-22: 4 mg via INTRAVENOUS
  Filled 2020-05-20: qty 2

## 2020-05-20 MED ORDER — POLYETHYLENE GLYCOL 3350 17 G PO PACK: 17.0000 g | PACK | Freq: Every day | ORAL | Status: DC | PRN

## 2020-05-20 NOTE — Consult Note (Addendum)
[facility] Renal Consultation Note    Indication for Consultation:  Management of ESRD/hemodialysis; anemia, hypertension/volume and secondary hyperparathyroidism PCP: Dr. Karle Plumber Nephrology-Dr. Hollie Salk  HPI: Karen Morrow is a 59 y.o. female with ESRD on hemodialysis T,Th,S at [facility]. PMH: HTN, hepatitis C, AFib not on anticoagulation, PAD, HFpEF, GIB, gastric ulcers, GERD, Asthma, anemia of chronic disease, SHPT, medical noncompliance. Last HD 05/10/2020.  Patient presented to ED with H/O GIB for diarrhea, GIB for 1 week and hypotension. SCr 9.69 BUN 31 CO2 13 K+ 2.4 Ca 6.7 C Ca 8.7 WBC 8.1 Plt 87 HGB 7.6. C Diff antigen positive, C Diff Toxin negative. + FOBT. CXR unremarkable. She received 2 units PRBCs -HGB fluctuated up to 11.6 post transfusion. GI has been consulted. Scheduled for EGD tomorrow.   Seen in ED, looks miserable, very thin and frail. She is complaining about not being able to have anything to eat. No other specific complaints. She has not been eating well, has lost a significant amount of weight. She has been admitted for upper GIB.   Past Medical History:  Diagnosis Date  . Anginal pain ([place])   . Asthma   . Atrial fibrillation ([place])    not on AC  . CHF (congestive heart failure) ([place])   . Chronic kidney disease    dialysis 3x wk  . Dyspnea   . Frequent bowel movements   . GERD (gastroesophageal reflux disease)   . Hepatitis C antibody test positive   . Hypertension    "just dx'd today" (11/03/2012)   Past Surgical History:  Procedure Laterality Date  . ABDOMINAL AORTOGRAM W/LOWER EXTREMITY Bilateral 01/21/2020   Procedure: ABDOMINAL AORTOGRAM W/LOWER EXTREMITY;  Surgeon: Waynetta Sandy, MD;  Location: [place] [facility];  Service: Cardiovascular;  Laterality: Bilateral;  . ABDOMINAL HYSTERECTOMY     "partial" (11/03/2012)  . AV FISTULA PLACEMENT Right 12/03/2013   Procedure: CREATION OF ARTERIOVENOUS (AV)  FISTULA RIGHT ARM ;  Surgeon: Rosetta Posner, MD;  Location: [place];  Service: Vascular;  Laterality: Right;  . AV FISTULA PLACEMENT Left 05/17/2018   Procedure: CREATION OF BRACHIOCEPHALIC FISTULA LEFT ARM;  Surgeon: Waynetta Sandy, MD;  Location: [place];  Service: Vascular;  Laterality: Left;  . [place] REMOVAL Right 03/16/2018   Procedure: REMOVAL OF ARTERIOVENOUS GORETEX GRAFT ([place]) RIGHT ARM;  Surgeon: Waynetta Sandy, MD;  Location: [place];  Service: Vascular;  Laterality: Right;  . BASCILIC VEIN TRANSPOSITION Right 03/06/2014   Procedure: SECOND STAGE BASILIC VEIN TRANSPOSITION;  Surgeon: Rosetta Posner, MD;  Location: [place];  Service: Vascular;  Laterality: Right;  . BIOPSY  09/30/2018   Procedure: BIOPSY;  Surgeon: Mauri Pole, MD;  Location: [facility] ENDOSCOPY;  Service: Endoscopy;;  . BIOPSY  08/01/2019   Procedure: BIOPSY;  Surgeon: Ronald Lobo, MD;  Location: [facility] ENDOSCOPY;  Service: Endoscopy;;  . BIOPSY  10/14/2019   Procedure: BIOPSY;  Surgeon: Doran Stabler, MD;  Location: [facility] ENDOSCOPY;  Service: Gastroenterology;;  . ENTEROSCOPY N/A 10/06/2018   Procedure: ENTEROSCOPY;  Surgeon: Lavena Bullion, DO;  Location: [place];  Service: Gastroenterology;  Laterality: N/A;  . ESOPHAGOGASTRODUODENOSCOPY N/A 06/05/2013   Procedure: ESOPHAGOGASTRODUODENOSCOPY (EGD);  Surgeon: Winfield Cunas., MD;  Location: [facility];  Service: Endoscopy;  Laterality: N/A;  . ESOPHAGOGASTRODUODENOSCOPY N/A 09/08/2016   Procedure: ESOPHAGOGASTRODUODENOSCOPY (EGD);  Surgeon: Milus Banister, MD;  Location: [place];  Service: Endoscopy;  Laterality: N/A;  . ESOPHAGOGASTRODUODENOSCOPY (EGD)  WITH PROPOFOL N/A 09/17/2018   Procedure: ESOPHAGOGASTRODUODENOSCOPY (EGD) WITH PROPOFOL;  Surgeon: Carol Ada, MD;  Location: [place];  Service: Endoscopy;  Laterality: N/A;  . ESOPHAGOGASTRODUODENOSCOPY (EGD) WITH PROPOFOL N/A 09/30/2018   Procedure: ESOPHAGOGASTRODUODENOSCOPY (EGD)  WITH PROPOFOL;  Surgeon: Mauri Pole, MD;  Location: [place] ENDOSCOPY;  Service: Endoscopy;  Laterality: N/A;  . ESOPHAGOGASTRODUODENOSCOPY (EGD) WITH PROPOFOL N/A 08/01/2019   Procedure: ESOPHAGOGASTRODUODENOSCOPY (EGD) WITH PROPOFOL;  Surgeon: Ronald Lobo, MD;  Location: [place];  Service: Endoscopy;  Laterality: N/A;  . ESOPHAGOGASTRODUODENOSCOPY (EGD) WITH PROPOFOL N/A 10/14/2019   Procedure: ESOPHAGOGASTRODUODENOSCOPY (EGD) WITH PROPOFOL;  Surgeon: Doran Stabler, MD;  Location: [place];  Service: Gastroenterology;  Laterality: N/A;  . INSERTION OF DIALYSIS CATHETER Left 03/16/2018   Procedure: PLACEMENT OF TUNNEL DIALYSIS CATHETER;  Surgeon: Waynetta Sandy, MD;  Location: [place];  Service: Vascular;  Laterality: Left;  . INSERTION OF DIALYSIS CATHETER Right 09/26/2018   Procedure: INSERTION OF DIALYSIS  TUNNELED CATHETER;  Surgeon: Elam Dutch, MD;  Location: [facility] OR;  Service: Vascular;  Laterality: Right;  . IR REMOVAL TUN CV CATH W/O FL  09/17/2018  . PERIPHERAL VASCULAR INTERVENTION  01/21/2020   Procedure: PERIPHERAL VASCULAR INTERVENTION;  Surgeon: Waynetta Sandy, MD;  Location: [place] [facility];  Service: Cardiovascular;;  left SFA  . REVISON OF ARTERIOVENOUS FISTULA Right 01/28/2017   Procedure: REVISON OF RIGHT ARM ARTERIOVENOUS FISTULA USING 8MMX10CM GORETEX GRAFT;  Surgeon: Angelia Mould, MD;  Location: [facility] OR;  Service: Vascular;  Laterality: Right;   Family History  Problem Relation Age of Onset  . Lupus Mother   . Diabetes Father   . Heart attack Father 21   Social History:  reports that she has been smoking cigarettes. She has a 20.00 pack-year smoking history. She has never used smokeless tobacco. She reports previous alcohol use. She reports previous drug use. Drug: Marijuana. Allergies  Allergen Reactions  . Cefazolin Other (See Comments)    Severe thrombocytopenia (has tolerated zosyn in the past) ID aware Tolerated  in October 2019  . Cephalosporins Anaphylaxis  . Tuberculin Swelling and Other (See Comments)    Site of swelling not known   Prior to Admission medications   Medication Sig Start Date End Date Taking? Authorizing Provider  albuterol (PROVENTIL HFA;VENTOLIN HFA) 108 (90 Base) MCG/ACT inhaler TAKE 2 PUFFS BY MOUTH EVERY 6 HOURS AS NEEDED FOR WHEEZE OR SHORTNESS OF BREATH Patient taking differently: Inhale 2 puffs into the lungs every 6 (six) hours as needed for wheezing or shortness of breath. 05/09/18   [name], MD  B Complex-C-Zn-Folic Acid (DIALYVITE 545 WITH ZINC) 0.8 MG TABS Take 1 tablet by mouth daily.    [provider]  b complex-vitamin c-folic acid (NEPHRO-VITE) 0.8 MG TABS tablet Take 1 tablet by mouth every evening. Patient not taking: Reported on 05/19/2020 11/16/19   [name], MD  calcium acetate (PHOSLO) 667 MG capsule Take 2 capsules (1,334 mg total) by mouth 3 (three) times daily. 11/16/19   [name], MD  cinacalcet (SENSIPAR) 30 MG tablet TAKE 1 TABLET BY MOUTH DAILY EVERY EVENING (DO NOT TAKE LESS THAN 12 HOURS PRIOR TO DIALYSIS) Patient taking differently: Take 30 mg by mouth See admin instructions. Take 30 mg by mouth in the evening and NOT LESS THAN 12 HOURS PRIOR TO DIALYSIS 11/16/19   [name], MD  clopidogrel (PLAVIX) 75 MG tablet Take 1 tablet (75 mg total) by mouth daily. 02/06/20  02/05/21  [name], MD  ferric citrate (AURYXIA) 1 GM 210 MG(Fe) tablet Take 420 mg by mouth 3 (three) times daily with meals.    [provider]  lidocaine-prilocaine (EMLA) cream Apply 1 application topically See admin instructions. Apply as directed before dialysis 09/25/19   [provider]  metoprolol tartrate (LOPRESSOR) 25 MG tablet Take 0.5 tablets (12.5 mg total) by mouth 2 (two) times daily. 11/16/19   [name], MD  mirtazapine (REMERON) 7.5 MG tablet Take 7.5 mg by mouth at bedtime. 12/18/19   [provider]  nicotine (NICODERM CQ - DOSED IN MG/24 HOURS) 21 mg/24hr patch Place 1 patch (21 mg total) onto the skin daily. 11/16/19   [name], MD  pantoprazole (PROTONIX) 40 MG tablet Take 1 tablet (40 mg total) by mouth 2 (two) times daily before a meal. for 2 months, then once daily 11/16/19   [name], MD  rosuvastatin (CRESTOR) 10 MG tablet Take 1 tablet (10 mg total) by mouth daily. 01/21/20 01/20/21  Waynetta Sandy, MD  triamcinolone cream (KENALOG) 0.1 % Apply 1 application topically 2 (two) times daily as needed. To right arm for itching Patient taking differently: Apply 1 application topically 2 (two) times daily as needed (to right arm, for itching). 11/16/19   [name], MD   Current Facility-Administered Medications  Medication Dose Route Frequency Provider Last Rate Last Admin  . 0.9 %  sodium chloride infusion   Intravenous Continuous Madelon Lips, MD 100 mL/hr at 05/20/20 0927 New Bag at 05/20/20 0927  . Chlorhexidine Gluconate Cloth 2 % PADS 6 each  6 each Topical Q0600 Justin Mend, MD      . cinacalcet [facility]) tablet 30 mg  30 mg Oral Q supper Justin Mend, MD      . dextrose 10 % infusion   Intravenous Continuous Gleason, Otilio Carpen, PA-C 50 mL/hr at 05/20/20 0404 New Bag at 05/20/20 0404  . docusate sodium (COLACE) capsule 100 mg  100 mg Oral BID PRN Deland Pretty, MD      . doxercalciferol (HECTOROL) injection 2 mcg  2 mcg Intravenous Q T,Th,Sa-HD Justin Mend, MD   2 mcg at 05/20/20 1321  . ferric citrate (AURYXIA) tablet 420 mg  420 mg Oral TID WC Justin Mend, MD   420 mg at 05/20/20 1449  . ondansetron (ZOFRAN) injection 4 mg  4 mg Intravenous Q6H PRN Deland Pretty, MD      . pantoprazole (PROTONIX) injection 40 mg  40 mg Intravenous Q12H Vena Rua, PA-C   40 mg at 05/20/20 1450  . polyethylene glycol (MIRALAX / GLYCOLAX) packet 17 g  17 g Oral Daily PRN Deland Pretty, MD       . thiamine (B-1) injection 100 mg  100 mg Intravenous Daily Toy Baker, MD   100 mg at 05/20/20 1449   Labs: Basic Metabolic Panel: Recent Labs  Lab 05/19/20 1336 05/19/20 1655 05/19/20 1748 05/20/20 0400 05/20/20 0408  NA 141  --   --  140 140  K 2.4* 2.1*  --  2.4* 2.0*  CL 109  --   --  103  --   CO2 13*  --   --  18*  --   GLUCOSE 53*  --   --  66*  --   BUN 31*  --   --  36*  --   CREATININE 9.69*  --   --  10.97*  --   CALCIUM 6.7*  --   --  8.3*  --   PHOS  --   --  7.2* 6.5*  --    Liver Function Tests: Recent Labs  Lab 05/19/20 1336  AST 28  ALT 14  ALKPHOS 57  BILITOT 1.0  PROT 3.9*  ALBUMIN 1.5*   No results for input(s): LIPASE, AMYLASE in the last 168 hours. Recent Labs  Lab 05/20/20 0545  AMMONIA 45*   CBC: Recent Labs  Lab 05/19/20 1655 05/20/20 0400 05/20/20 0408  WBC 8.1 8.5  --   NEUTROABS 6.3  --   --   HGB 7.6* 11.6* 11.6*  HCT 22.8* 35.3* 34.0*  MCV 83.2 83.8  --   PLT 87* 100*  --    Cardiac Enzymes: Recent Labs  Lab 05/20/20 0545  CKTOTAL 207   CBG: Recent Labs  Lab 05/20/20 0059 05/20/20 0225 05/20/20 0355 05/20/20 0548 05/20/20 0850  GLUCAP 82 73 74 108* 95   Iron Studies: No results for input(s): IRON, TIBC, TRANSFERRIN, FERRITIN in the last 72 hours. Studies/Results: DG Chest Port 1 View  Result Date: 05/19/2020 CLINICAL DATA:  History of GI bleed and missed dialysis session EXAM: PORTABLE CHEST 1 VIEW COMPARISON:  10/13/2019 FINDINGS: Cardiac shadow is within normal limits. Aortic calcifications are seen. The lungs are clear bilaterally. No bony abnormality is seen. IMPRESSION: No active disease. Electronically Signed   By: Inez Catalina M.D.   On: 05/19/2020 11:55    ROS: As per HPI otherwise negative.  Physical Exam: Vitals:   05/20/20 1230 05/20/20 1300 05/20/20 1336 05/20/20 1412  BP: 95/75 121/89 106/74 (!) 89/71  Pulse:  (!) 105  96  Resp: (!) 30  (!) 21 20  Temp:   98 F (36.7 C) 98.3 F  (36.8 C)  TempSrc:   Oral Oral  SpO2:    98%  Weight:      Height:         General: Cachectic, frail appearing female  in no acute distress. Head: Normocephalic, atraumatic, sclera non-icteric, mucus membranes are moist Neck: Supple. JVD not elevated. Lungs: Bilateral breath sounds, decreased in bases otherwise CTAB Heart: RRR with S1 S2. No murmurs, rubs, or gallops appreciated. Abdomen: Soft, non-tender, non-distended with normoactive bowel sounds. No rebound/guarding. No obvious abdominal masses. Flexiseal with liquid Ch[place] Charters stool.  M-S:  Strength and tone appear normal for age. Lower extremities:without edema or ischemic changes, no open wounds  Neuro: Alert and oriented X 3. Moves all extremities spontaneously. Psych:  Responds to questions appropriately with a normal affect. Dialysis Access: L AVG + bruit  Dialysis Orders: [place] T,Th,S 3:45 hr 180NRe 350/600 42 kg 3.0K/2.25 Ca AVF -Mircera 50 mcg IV q 2 weeks -Hectorol 2 mcg IV q 2 weeks -Venofer 50 mg IV weekly  Assessment/Plan: 1.  Upper GIB with ABLA-Per primary/GIB. Tentatively scheduled for EGD tomorrow. HGB 7.6 on adm S/P 2 units PRBCs.  2.  C difficile-per primary 3.  Hypokalemia-being repleted. Use 4.0 K bath with HD today.  4.  Failure to thrive-PCM, continued weight loss. Hypoglycemia requiring D10w in ED. Will consult palliative care for goals of care. 5.  ESRD - T,Th,S No HD for one week. HD today on schedule. No heparin.  6.  Hypertension/volume  -hypotensive, very dry.Ran even.Continue NS @ 100cc/hr.  7.  Anemia  -See # 1. Follow HGB, transfuse as needed.  8.  Metabolic bone disease -  C Ca OK. PO4 elevated. Continue  VDRA, binders when eating.  9.  Nutrition - Clear liquids at present. Albumin 1.5. Regular diet, protein supp, renal vit. When able to eat.  10. H/O Hep C  Rotha Cassels H. Owens Shark, NP-C 05/20/2020, 3:06 PM  [employer] [phone]

## 2020-05-20 NOTE — Progress Notes (Addendum)
NAME:  Karen Morrow, MRN:  [id number], DOB:  11-21-1960, LOS: 0 ADMISSION DATE:  05/19/2020, CONSULTATION DATE:  05/20/20 REFERRING MD:  EDP, CHIEF COMPLAINT:  GIB   Brief History:  59 year-old female with a history of ESRD, asthma, gastric ulcers and GERD, atrial fibrillation not on anticoagulation, CHF, hep C and hypertension who presented with dark stools and diarrhea for the last 3 days.  Stool was heme positive and hemoglobin 7.6 with hypotension, C. difficile also positive.  PCCM consulted for hypotension.  History of Present Illness:  Karen Morrow is a 59 year old female with past medical history  ESRD, asthma, gastric ulcers and GERD, atrial fibrillation not on anticoagulation, CHF, hep C and hypertension who presented with dark stools and diarrhea for the last 3 days.  Patient has a history of upper GI bleed and had an EGD 09/2019 showing cratered gastric ulcer and duodenitis, on PPI at home.  Patient has not been feeling well for the last week and has missed dialysis, she has had dark diarrhea and fatigue.  Denies abdominal pain, vomiting, fever, chest pain  In the ED, patient was hypotensive initially 89/64, she was given 500 cc bolus.  Hemoglobin was 7.6, 1 unit PRBCs just initiated at the time of consult.  Lab results significant for hypokalemia of 2.1.  INR 1.7, positive C. difficile antigen without leukocytosis.  Blood pressure remained low so PCCM was consulted  Past Medical History:   has a past medical history of Anginal pain ([place]), Asthma, Atrial fibrillation ([place]), CHF (congestive heart failure) ([place]), Chronic kidney disease, Dyspnea, Frequent bowel movements, GERD (gastroesophageal reflux disease), Hepatitis C antibody test positive, and Hypertension.   Significant Hospital Events:  12/20-admit  Consults:  PCCM  Procedures:    Significant Diagnostic Tests:  12/20 CXR>> no acute findings  Micro Data:  12/20 C. Difficile>> positive 12/20 Covid-19 and flu>>  negative  Antimicrobials:    Interim History / Subjective:  Awake alert not on vasopressor support  Objective   Blood pressure 91/63, pulse 82, temperature 98 F (36.7 C), temperature source Oral, resp. rate 17, height 5\' 7"  (1.702 m), weight 71.7 kg, SpO2 95 %.        Intake/Output Summary (Last 24 hours) at 05/20/2020 0911 Last data filed at 05/20/2020 0248 Gross per 24 hour  Intake 2258.12 ml  Output --  Net 2258.12 ml   Filed Weights   05/19/20 1034  Weight: 71.7 kg   General: Frail cachectic female no acute distress HEENT: MM pink/moist no JVD lymphadenopathy is appreciated Neuro: Intact demanding something to eat CV: Heart sounds are irregular, blood pressure 94/62 PULM: Creased breath sounds in the bases  GI: soft, bsx4 active, Flexi-Seal in place GU: Anuric Extremities: warm/dry,  edema left bicep AV graft positive bruit and thrill Skin: no rashes or lesions negative for lower extremity edema    Resolved Hospital Problem list     Assessment & Plan:   Likely Upper GI bleed with ABLA in the setting of acute, C. difficile infection and ESRD Hemoglobin 7.6, suspect this slow upper GI bleed secondary to known ulcer disease.  Hemoglobin has been increase status post transfusion not requiring vasopressor support as of 05/20/2020 P: Currently hemodynamically stable status post transfusion Can be monitored in stepdown unit Consider transition back to [facility] service To be evaluated by GI services 05/20/2020 Currently with Flexi-Seal without any obvious bleeding  End-stage renal disease with hypokalemia Recent Labs  Lab 05/19/20 1655 05/20/20 0400 05/20/20 0408  K  2.1* 2.4* 2.0*   Lab Results  Component Value Date   CREATININE 10.97 (H) 05/20/2020   CREATININE 9.69 (H) 05/19/2020   CREATININE 4.30 (H) 01/21/2020   CREATININE 3.61 (H) 08/09/2013   She is for hemodialysis today  Chronic congestive heart failure Continue to monitor stepdown  unit  Hypertension Hold antihypertensive      Best practice (evaluated daily)  Diet:npo Pain/Anxiety/Delirium protocol (if indicated): N/A VAP protocol (if indicated): N/A DVT prophylaxis: SCDs GI prophylaxis: PPI Glucose control: N/A Mobility: Bedrest Disposition: To SDU, Dr. Loleta Books has taken on [facility] service(thankyou) PCCM available as needed.  Goals of Care:  Last date of multidisciplinary goals of care discussion: N/A Family and staff present: N/A Summary of discussion:  Follow up goals of care discussion due:  Code Status: Full code  Labs   CBC: Recent Labs  Lab 05/19/20 1655 05/20/20 0400 05/20/20 0408  WBC 8.1 8.5  --   NEUTROABS 6.3  --   --   HGB 7.6* 11.6* 11.6*  HCT 22.8* 35.3* 34.0*  MCV 83.2 83.8  --   PLT 87* 100*  --     Basic Metabolic Panel: Recent Labs  Lab 05/19/20 1336 05/19/20 1655 05/19/20 1748 05/20/20 0400 05/20/20 0408  NA 141  --   --  140 140  K 2.4* 2.1*  --  2.4* 2.0*  CL 109  --   --  103  --   CO2 13*  --   --  18*  --   GLUCOSE 53*  --   --  66*  --   BUN 31*  --   --  36*  --   CREATININE 9.69*  --   --  10.97*  --   CALCIUM 6.7*  --   --  8.3*  --   MG  --   --  1.9 1.8  --   PHOS  --   --  7.2* 6.5*  --    GFR: Estimated Creatinine Clearance: 5.3 mL/min (A) (by C-G formula based on SCr of 10.97 mg/dL (H)). Recent Labs  Lab 05/19/20 1655 05/20/20 0400 05/20/20 0545  WBC 8.1 8.5  --   LATICACIDVEN  --   --  1.7    Liver Function Tests: Recent Labs  Lab 05/19/20 1336  AST 28  ALT 14  ALKPHOS 57  BILITOT 1.0  PROT 3.9*  ALBUMIN 1.5*   No results for input(s): LIPASE, AMYLASE in the last 168 hours. Recent Labs  Lab 05/20/20 0545  AMMONIA 45*    ABG    Component Value Date/Time   PHART 7.265 (L) 03/10/2018 1301   PCO2ART 45.6 03/10/2018 1301   PO2ART 225 (H) 03/10/2018 1301   HCO3 17.9 (L) 05/20/2020 0408   TCO2 19 (L) 05/20/2020 0408   ACIDBASEDEF 5.0 (H) 05/20/2020 0408   O2SAT 94.0  05/20/2020 0408     Coagulation Profile: Recent Labs  Lab 05/19/20 1336  INR 1.7*    Cardiac Enzymes: Recent Labs  Lab 05/20/20 0545  CKTOTAL 207    HbA1C: Hgb A1c MFr Bld  Date/Time Value Ref Range Status  11/26/2013 08:05 PM 4.7 <5.7 % Final    Comment:    (NOTE)  According to the ADA Clinical Practice Recommendations for 2011, when HbA1c is used as a screening test:  >=6.5%   Diagnostic of Diabetes Mellitus           (if abnormal result is confirmed) 5.7-6.4%   Increased risk of developing Diabetes Mellitus References:Diagnosis and Classification of Diabetes Mellitus,Diabetes [id number],0684,03(VVLRT 1):S62-S69 and Standards of Medical Care in         Diabetes - 2011,Diabetes [id number],9278,00 (Suppl 1):S11-S61.    CBG: Recent Labs  Lab 05/20/20 0059 05/20/20 0225 05/20/20 0355 05/20/20 0548 05/20/20 0850  GLUCAP 82 73 74 108* 95     Critical care time: 30 minutes      Richardson Landry Keyunna Coco ACNP Acute Care Nurse Practitioner [place] Please consult Amion 05/20/2020, 9:11 AM

## 2020-05-20 NOTE — ED Notes (Signed)
Lunch Tray Ordered @ 1117. 

## 2020-05-20 NOTE — ED Notes (Signed)
Phlebotomy made aware not to draw repeat Blood specimens until 0330 at the earliest.

## 2020-05-20 NOTE — ED Notes (Signed)
Pt in Dialysis.

## 2020-05-20 NOTE — ED Notes (Signed)
Report called to Dialysis RN. Pt to Dialysis.

## 2020-05-20 NOTE — H&P (Signed)
Karen Morrow PPI:[id number] DOB: 1960-09-14 DOA: 05/19/2020    PCP: [name], MD   Outpatient Specialists:    NEphrology: Dr. Hollie Salk   GI Dr. Ardis Hughs (  [facility])   Patient arrived to ER on 05/19/20 at 1030 Referred by Attending Deland Pretty, *   Patient coming from: home Lives With family    Chief Complaint:   Chief Complaint  Patient presents with  . Rectal Bleeding    HPI: Karen Morrow is a 59 y.o. female with medical history significant of ESRD, HTN, anemia of chronic disease, PAD on Plavix, hx of Upper gi bleed, chronic diastolic CHF, asthma, atrial fibrillation not on anticoagulation secondary to recurrent bleeding, GERD, hepatitis C antibody positive, aortic insufficiency, history of pulmonary hypertension due to left ventricular diastolic dysfunction, tobacco abuse    Presented with  1 wk hx of GI bleed, have been soaking through sanitary pads. Due to bleeding has not been going to her HD last week.  She soaked up to 60 sanitary napkins with bloody stool. States she no longer on PLAVIX   Last admission in May 2021 showed gastric ulcer and duodenitis  patient on PPI  Infectious risk factors:  Reports severe fatigue    Has  been vaccinated against COVID    Initial COVID TEST  NEGATIVE   Lab Results  Component Value Date   [place] 05/19/2020   [place] NEGATIVE 01/18/2020   [place] NEGATIVE 10/13/2019   [place] NEGATIVE 08/01/2019     Regarding pertinent Chronic problems:    Hyperlipidemia - on statins Crestor Lipid Panel     Component Value Date/Time   CHOL 120 06/17/2017 1617   TRIG 91 06/17/2017 1617   HDL 50 06/17/2017 1617   CHOLHDL 2.4 06/17/2017 1617   CHOLHDL 2.2 12/11/2014 1004   VLDL 24 12/11/2014 1004   LDLCALC 52 06/17/2017 1617   LABVLDL 18 06/17/2017 1617     HTN on metoprolol   chronic CHF diastolic  - last echo Grade II diastolic  dysfunction (pseudonormalization).     Asthma -well   on home inhalers                            A. Fib -  - CHA2DS2 vas score   3     Not on anticoagulation secondary to   recurrent bleeding         -  Rate control:  Currently controlled with  Metoprolol   ESRD   Lab Results  Component Value Date   CREATININE 9.69 (H) 05/19/2020   CREATININE 4.30 (H) 01/21/2020   CREATININE 6.34 (H) 10/15/2019   Chronic anemia - baseline hg Hemoglobin & Hematocrit  Recent Labs    10/16/19 0539 01/21/20 0747 05/19/20 1655  HGB 9.7* 11.6* 7.6*    While in ER: initially hypotensive down to 89/64 Given 500 ml bolus Ordered 1 unit of blood Noted to be showing evidence of hyperkalemia ordered replacement  Noted to be hypoglycemia down to 50's and was given 1 amp of glucose  C.diff toxin negative but c.dif antigen positive  ER Provider Called: [facility]   Dr. Fuller Plan They Recommend admit to medicine   Will see in AM    ER Provider Called: Nephrology Dr.   Deborha Payment Recommend admit to medicine   Will see in AM   PCCM team -at this point requesting medicine to admit as patient likely would improve with  IV fluid and blood transfusions  Hospitalist was called for admission for upper Gi bleed  Patient continued to have unstable BP PCCM to admit to ICU overnight  The following Work up has been ordered so far:  Orders Placed This Encounter  Procedures  . Resp Panel by RT-PCR (Flu A&B, Covid) Nasopharyngeal Swab  . C Difficile Quick Screen w PCR reflex  . Gastrointestinal Panel by PCR , Stool  . C. Diff by PCR, Reflexed  . DG Chest Port 1 View  . Protime-INR  . CBC with Differential/Platelet  . Comprehensive metabolic panel  . Potassium  . CBC with Differential  . Magnesium  . Phosphorus  . CK  . Ammonia  . Lactic acid, plasma  . Blood gas, venous  . Prealbumin  . CBC  . Haptoglobin  . Lactate dehydrogenase  . Hepatitis c vrs RNA detect by PCR-qual  . CBC  . Creatinine, serum  . Basic metabolic panel  . Magnesium  . Phosphorus  .  Hemoglobin and hematocrit, blood  . Diet NPO time specified  . Place X2 Large Bore IV's  . Initiate Carrier Fluid Protocol  . Refer to nursing sidebar and the Clinical Skills Nursing Procedures web link for further information on insertion, maintenance, and discontinuation criteria of the Indwelling Fecal Containment Device.  . Insert indwelling fecal management system  . Re-check Vital Signs  . Informed Consent Details: Physician/Practitioner Attestation; Transcribe to consent form and obtain patient signature  . Re-check Vital Signs  . Pre-Hemodialysis Protocol - Day of Dialysis  . Post-Dialysis Protocol - Day of Dialysis  . Strict intake and output  . Daily weights  . Place and maintain sequential compression device  . Cardiac monitoring  . Vital signs  . Cardiac monitoring  . Initiate eLink PCCM Adult ICU Electrolyte Replacement Protocol  . Weigh on admission and daily  . Strict intake and output  . Initiate Oral Care Protocol  . Initiate Carrier Fluid Protocol  . SCDs  . Full code  . Consult to nephrology  ALL PATIENTS BEING ADMITTED/HAVING PROCEDURES NEED COVID-19 SCREENING  . Consult to gastroenterology  ALL PATIENTS BEING ADMITTED/HAVING PROCEDURES NEED COVID-19 SCREENING  . Consult to hospitalist  ALL PATIENTS BEING ADMITTED/HAVING PROCEDURES NEED COVID-19 SCREENING  . Consult to intensivist  ALL PATIENTS BEING ADMITTED/HAVING PROCEDURES NEED COVID-19 SCREENING  . Consult to Registered Dietitian  . CCM pharmacy monitoring  . Enteric precautions (UV disinfection) C difficile, Norovirus  . Pulse oximetry, continuous  . POC occult blood, ED  . I-Stat beta hCG blood, ED  . CBG monitoring, ED  . CBG monitoring, ED  . CBG monitoring, ED  . CBG monitoring, ED  . CBG monitoring, ED  . CBG monitoring, ED  . ED EKG  . EKG 12-Lead  . Type and screen  . Prepare RBC (crossmatch)  . Prepare RBC (crossmatch)  . Hemodialysis inpatient  . Admit to Inpatient (patient's expected  length of stay will be greater than 2 midnights or inpatient only procedure)    Following Medications were ordered in ER: Medications  Chlorhexidine Gluconate Cloth 2 % PADS 6 each (has no administration in time range)  doxercalciferol (HECTOROL) injection 2 mcg (has no administration in time range)  cinacalcet (SENSIPAR) tablet 30 mg (has no administration in time range)  ferric citrate (AURYXIA) tablet 420 mg (has no administration in time range)  thiamine (B-1) injection 100 mg (has no administration in time range)  docusate sodium (COLACE) capsule 100 mg (has no  administration in time range)  polyethylene glycol (MIRALAX / GLYCOLAX) packet 17 g (has no administration in time range)  ondansetron (ZOFRAN) injection 4 mg (has no administration in time range)  pantoprazole (PROTONIX) injection 40 mg (has no administration in time range)  lactated ringers bolus 250 mL (has no administration in time range)  sodium chloride 0.9 % bolus 500 mL (0 mLs Intravenous Stopped 05/19/20 1411)  pantoprazole (PROTONIX) 80 mg in sodium chloride 0.9 % 100 mL IVPB (0 mg Intravenous Stopped 05/19/20 1349)  dextrose 50 % solution 50 mL (50 mLs Intravenous Given 05/19/20 1741)  potassium chloride SA (KLOR-CON) CR tablet 40 mEq (40 mEq Oral Given 05/19/20 1936)  potassium chloride 10 mEq in 100 mL IVPB (0 mEq Intravenous Stopped 05/19/20 1921)  0.9 %  sodium chloride infusion (0 mL/hr Intravenous Stopped 05/19/20 2054)  sodium chloride 0.9 % bolus 500 mL (0 mLs Intravenous Stopped 05/19/20 1922)  lactated ringers bolus 500 mL (0 mLs Intravenous Stopped 05/19/20 2315)  calcium gluconate 1 g/ 50 mL sodium chloride IVPB (1,000 mg Intravenous New Bag/Given 05/20/20 0133)        Consult Orders  (From admission, onward)         Start     Ordered   05/19/20 2217  Consult to Registered Dietitian  Once       Provider:  (Not yet assigned)  Question:  Reason for consult?  Answer:  Assessment of nutrition  requirements/status   05/19/20 2216   05/19/20 1818  Consult to hospitalist  ALL PATIENTS BEING ADMITTED/HAVING PROCEDURES NEED COVID-19 SCREENING Pg sent by deloris  Once       Comments: ALL PATIENTS BEING ADMITTED/HAVING PROCEDURES NEED COVID-19 SCREENING  Provider:  (Not yet assigned)  Question Answer Comment  Place call to: [facility]   Reason for Consult Admit      05/19/20 1817          Significant initial  Findings: Abnormal Labs Reviewed  C DIFFICILE QUICK SCREEN W PCR REFLEX - Abnormal; Notable for the following components:      Result Value   C Diff antigen POSITIVE (*)    All other components within normal limits  PROTIME-INR - Abnormal; Notable for the following components:   Prothrombin Time 19.6 (*)    INR 1.7 (*)    All other components within normal limits  COMPREHENSIVE METABOLIC PANEL - Abnormal; Notable for the following components:   Potassium 2.4 (*)    CO2 13 (*)    Glucose, Bld 53 (*)    BUN 31 (*)    Creatinine, Ser 9.69 (*)    Calcium 6.7 (*)    Total Protein 3.9 (*)    Albumin 1.5 (*)    GFR, Estimated 4 (*)    Anion gap 19 (*)    All other components within normal limits  POTASSIUM - Abnormal; Notable for the following components:   Potassium 2.1 (*)    All other components within normal limits  CBC WITH DIFFERENTIAL/PLATELET - Abnormal; Notable for the following components:   RBC 2.74 (*)    Hemoglobin 7.6 (*)    HCT 22.8 (*)    RDW 16.5 (*)    Platelets 87 (*)    All other components within normal limits  PHOSPHORUS - Abnormal; Notable for the following components:   Phosphorus 7.2 (*)    All other components within normal limits  POC OCCULT BLOOD, ED - Abnormal; Notable for the following components:   Fecal Occult  Bld POSITIVE (*)    All other components within normal limits  CBG MONITORING, ED - Abnormal; Notable for the following components:   Glucose-Capillary 50 (*)    All other components within normal limits  CBG  MONITORING, ED - Abnormal; Notable for the following components:   Glucose-Capillary 50 (*)    All other components within normal limits  CBG MONITORING, ED - Abnormal; Notable for the following components:   Glucose-Capillary 100 (*)    All other components within normal limits    Otherwise labs showing:   Recent Labs  Lab 05/19/20 1336 05/19/20 1655 05/19/20 1748  NA 141  --   --   K 2.4* 2.1*  --   CO2 13*  --   --   GLUCOSE 53*  --   --   BUN 31*  --   --   CREATININE 9.69*  --   --   CALCIUM 6.7*  --   --   MG  --   --  1.9  PHOS  --   --  7.2*    Cr    Up from baseline see below Lab Results  Component Value Date   CREATININE 9.69 (H) 05/19/2020   CREATININE 4.30 (H) 01/21/2020   CREATININE 6.34 (H) 10/15/2019    Recent Labs  Lab 05/19/20 1336  AST 28  ALT 14  ALKPHOS 57  BILITOT 1.0  PROT 3.9*  ALBUMIN 1.5*   Lab Results  Component Value Date   CALCIUM 6.7 (L) 05/19/2020   PHOS 7.2 (H) 05/19/2020   WBC       Component Value Date/Time   WBC 8.1 05/19/2020 1655   LYMPHSABS 1.3 05/19/2020 1655   MONOABS 0.5 05/19/2020 1655   EOSABS 0.0 05/19/2020 1655   BASOSABS 0.0 05/19/2020 1655    Plt: Lab Results  Component Value Date   PLT 87 (L) 05/19/2020    Lactic Acid, Venous    Component Value Date/Time   LATICACIDVEN 1.5 08/01/2019 1930    COVID-19 Labs  No results for input(s): DDIMER, FERRITIN, LDH, CRP in the last 72 hours.  Lab Results  Component Value Date   SARSCOV2NAA NEGATIVE 05/19/2020   SARSCOV2NAA NEGATIVE 01/18/2020   SARSCOV2NAA NEGATIVE 10/13/2019   [place] NEGATIVE 08/01/2019     HG/HCT    Down   from baseline see below    Component Value Date/Time   HGB 7.6 (L) 05/19/2020 1655   HGB 11.0 (L) 10/15/2016 1211   HCT 22.8 (L) 05/19/2020 1655   HCT 32.1 (L) 10/15/2016 1211   MCV 83.2 05/19/2020 1655   MCV 92 10/15/2016 1211      ECG: Ordered Personally reviewed by me showing: HR : 91 Rhythm:  NSR   nonspecific  changes LVH QTC 461    CBG (last 3)  Recent Labs    05/19/20 2050 05/19/20 2233 05/20/20 0059  GLUCAP 80 74 82     CXR - NON acute    ED Triage Vitals  Enc Vitals Group     BP 05/19/20 1040 (!) 88/71     Pulse Rate 05/19/20 1040 100     Resp 05/19/20 1040 18     Temp 05/19/20 1040 97.8 F (36.6 C)     Temp Source 05/19/20 1040 Oral     SpO2 05/19/20 1040 100 %     Weight 05/19/20 1034 158 lb (71.7 kg)     Height 05/19/20 1034 5\' 7"  (1.702 m)     Head  Circumference --      Peak Flow --      Pain Score 05/19/20 1034 0     Pain Loc --      Pain Edu? --      Excl. in [place]? --   TMAX(24)@       Latest  Blood pressure (!) 77/56, pulse (!) 107, temperature 97.7 F (36.5 C), temperature source Oral, resp. rate 19, height 5\' 7"  (1.702 m), weight 71.7 kg, SpO2 98 %.   Review of Systems:    Pertinent positives include:  fatigue, abdominal pain melena,   Constitutional:  No weight loss, night sweats, Fevers, chills, weight loss  HEENT:  No headaches, Difficulty swallowing,Tooth/dental problems,Sore throat,  No sneezing, itching, ear ache, nasal congestion, post nasal drip,  Cardio-vascular:  No chest pain, Orthopnea, PND, anasarca, dizziness, palpitations.no Bilateral lower extremity swelling  GI:  No heartburn, indigestion,, nausea, vomiting, diarrhea, change in bowel habits, loss of appetite, blood in stool, hematemesis Resp:  no shortness of breath at rest. No dyspnea on exertion, No excess mucus, no productive cough, No non-productive cough, No coughing up of blood.No change in color of mucus. No wheezing. Skin:  no rash or lesions. No jaundice GU:  no dysuria, change in color of urine, no urgency or frequency. No straining to urinate.  No flank pain.  Musculoskeletal:  No joint pain or no joint swelling. No decreased range of motion. No back pain.  Psych:  No change in mood or affect. No depression or anxiety. No memory loss.  Neuro: no localizing neurological  complaints, no tingling, no weakness, no double vision, no gait abnormality, no slurred speech, no confusion  All systems reviewed and apart from [place] all are negative  Past Medical History:   Past Medical History:  Diagnosis Date  . Anginal pain ([place])   . Asthma   . Atrial fibrillation ([place])    not on AC  . CHF (congestive heart failure) ([place])   . Chronic kidney disease    dialysis 3x wk  . Dyspnea   . Frequent bowel movements   . GERD (gastroesophageal reflux disease)   . Hepatitis C antibody test positive   . Hypertension    "just dx'd today" (11/03/2012)      Past Surgical History:  Procedure Laterality Date  . ABDOMINAL AORTOGRAM W/LOWER EXTREMITY Bilateral 01/21/2020   Procedure: ABDOMINAL AORTOGRAM W/LOWER EXTREMITY;  Surgeon: Waynetta Sandy, MD;  Location: [facility];  Service: Cardiovascular;  Laterality: Bilateral;  . ABDOMINAL HYSTERECTOMY     "partial" (11/03/2012)  . AV FISTULA PLACEMENT Right 12/03/2013   Procedure: CREATION OF ARTERIOVENOUS (AV) FISTULA RIGHT ARM ;  Surgeon: Rosetta Posner, MD;  Location: [facility];  Service: Vascular;  Laterality: Right;  . AV FISTULA PLACEMENT Left 05/17/2018   Procedure: CREATION OF BRACHIOCEPHALIC FISTULA LEFT ARM;  Surgeon: Waynetta Sandy, MD;  Location: [facility];  Service: Vascular;  Laterality: Left;  . [place] REMOVAL Right 03/16/2018   Procedure: REMOVAL OF ARTERIOVENOUS GORETEX GRAFT ([place]) RIGHT ARM;  Surgeon: Waynetta Sandy, MD;  Location: [facility];  Service: Vascular;  Laterality: Right;  . BASCILIC VEIN TRANSPOSITION Right 03/06/2014   Procedure: SECOND STAGE BASILIC VEIN TRANSPOSITION;  Surgeon: Rosetta Posner, MD;  Location: [facility];  Service: Vascular;  Laterality: Right;  . BIOPSY  09/30/2018   Procedure: BIOPSY;  Surgeon: Mauri Pole, MD;  Location: [facility];  Service: Endoscopy;;  . BIOPSY  08/01/2019   Procedure: BIOPSY;  Surgeon: Ronald Lobo, MD;  Location[facility];  Service:  Endoscopy;;  . BIOPSY  10/14/2019   Procedure: BIOPSY;  Surgeon: Doran Stabler, MD;  Location: [facility] ENDOSCOPY;  Service: Gastroenterology;;  . ENTEROSCOPY N/A 10/06/2018   Procedure: ENTEROSCOPY;  Surgeon: Lavena Bullion, DO;  Location: [facility];  Service: Gastroenterology;  Laterality: N/A;  . ESOPHAGOGASTRODUODENOSCOPY N/A 06/05/2013   Procedure: ESOPHAGOGASTRODUODENOSCOPY (EGD);  Surgeon: Winfield Cunas., MD;  Location: [facility];  Service: Endoscopy;  Laterality: N/A;  . ESOPHAGOGASTRODUODENOSCOPY N/A 09/08/2016   Procedure: ESOPHAGOGASTRODUODENOSCOPY (EGD);  Surgeon: Milus Banister, MD;  Location: [facility];  Service: Endoscopy;  Laterality: N/A;  . ESOPHAGOGASTRODUODENOSCOPY (EGD) WITH PROPOFOL N/A 09/17/2018   Procedure: ESOPHAGOGASTRODUODENOSCOPY (EGD) WITH PROPOFOL;  Surgeon: Carol Ada, MD;  Location: [facility];  Service: Endoscopy;  Laterality: N/A;  . ESOPHAGOGASTRODUODENOSCOPY (EGD) WITH PROPOFOL N/A 09/30/2018   Procedure: ESOPHAGOGASTRODUODENOSCOPY (EGD) WITH PROPOFOL;  Surgeon: Mauri Pole, MD;  Location: [facility];  Service: Endoscopy;  Laterality: N/A;  . ESOPHAGOGASTRODUODENOSCOPY (EGD) WITH PROPOFOL N/A 08/01/2019   Procedure: ESOPHAGOGASTRODUODENOSCOPY (EGD) WITH PROPOFOL;  Surgeon: Ronald Lobo, MD;  Location: [facility];  Service: Endoscopy;  Laterality: N/A;  . ESOPHAGOGASTRODUODENOSCOPY (EGD) WITH PROPOFOL N/A 10/14/2019   Procedure: ESOPHAGOGASTRODUODENOSCOPY (EGD) WITH PROPOFOL;  Surgeon: Doran Stabler, MD;  Location: [facility];  Service: Gastroenterology;  Laterality: N/A;  . INSERTION OF DIALYSIS CATHETER Left 03/16/2018   Procedure: PLACEMENT OF TUNNEL DIALYSIS CATHETER;  Surgeon: Waynetta Sandy, MD;  Location: [facility];  Service: Vascular;  Laterality: Left;  . INSERTION OF DIALYSIS CATHETER Right 09/26/2018   Procedure: INSERTION OF DIALYSIS  TUNNELED CATHETER;  Surgeon: Elam Dutch, MD;  Location: [facility];   Service: Vascular;  Laterality: Right;  . IR REMOVAL TUN CV CATH W/O FL  09/17/2018  . PERIPHERAL VASCULAR INTERVENTION  01/21/2020   Procedure: PERIPHERAL VASCULAR INTERVENTION;  Surgeon: Waynetta Sandy, MD;  Location: [facility] CV [facility];  Service: Cardiovascular;;  left SFA  . REVISON OF ARTERIOVENOUS FISTULA Right 01/28/2017   Procedure: REVISON OF RIGHT ARM ARTERIOVENOUS FISTULA USING 8MMX10CM GORETEX GRAFT;  Surgeon: Angelia Mould, MD;  Location: [facility];  Service: Vascular;  Laterality: Right;    Social History:  Ambulatory  [place]      reports that she has been smoking cigarettes. She has a 20.00 pack-year smoking history. She has never used smokeless tobacco. She reports previous alcohol use. She reports previous drug use. Drug: Marijuana.    Family History:   Family History  Problem Relation Age of Onset  . Lupus Mother   . Diabetes Father   . Heart attack Father 67    Allergies: Allergies  Allergen Reactions  . Cefazolin Other (See Comments)    Severe thrombocytopenia (has tolerated zosyn in the past) ID aware Tolerated in October 2019  . Cephalosporins Anaphylaxis  . Tuberculin Swelling and Other (See Comments)    Site of swelling not known     Prior to Admission medications   Medication Sig Start Date End Date Taking? Authorizing Provider  albuterol (PROVENTIL HFA;VENTOLIN HFA) 108 (90 Base) MCG/ACT inhaler TAKE 2 PUFFS BY MOUTH EVERY 6 HOURS AS NEEDED FOR WHEEZE OR SHORTNESS OF BREATH Patient taking differently: Inhale 2 puffs into the lungs every 6 (six) hours as needed for wheezing or shortness of breath. 05/09/18   [name], MD  B Complex-C-Zn-Folic Acid (DIALYVITE 829 WITH ZINC) 0.8 MG TABS Take 1 tablet by mouth daily.  [provider]  b complex-vitamin c-folic acid (NEPHRO-VITE) 0.8 MG TABS tablet Take 1 tablet by mouth every evening. Patient not taking: Reported on 05/19/2020 11/16/19   [name], MD  calcium  acetate (PHOSLO) 667 MG capsule Take 2 capsules (1,334 mg total) by mouth 3 (three) times daily. 11/16/19   [name], MD  cinacalcet (SENSIPAR) 30 MG tablet TAKE 1 TABLET BY MOUTH DAILY EVERY EVENING (DO NOT TAKE LESS THAN 12 HOURS PRIOR TO DIALYSIS) Patient taking differently: Take 30 mg by mouth See admin instructions. Take 30 mg by mouth in the evening and NOT LESS THAN 12 HOURS PRIOR TO DIALYSIS 11/16/19   [name], MD  clopidogrel (PLAVIX) 75 MG tablet Take 1 tablet (75 mg total) by mouth daily. 02/06/20 02/05/21  [name], MD  ferric citrate (AURYXIA) 1 GM 210 MG(Fe) tablet Take 420 mg by mouth 3 (three) times daily with meals.    [provider]  lidocaine-prilocaine (EMLA) cream Apply 1 application topically See admin instructions. Apply as directed before dialysis 09/25/19   [provider]  metoprolol tartrate (LOPRESSOR) 25 MG tablet Take 0.5 tablets (12.5 mg total) by mouth 2 (two) times daily. 11/16/19   [name], MD  mirtazapine (REMERON) 7.5 MG tablet Take 7.5 mg by mouth at bedtime. 12/18/19   [provider]  nicotine (NICODERM CQ - DOSED IN MG/24 HOURS) 21 mg/24hr patch Place 1 patch (21 mg total) onto the skin daily. 11/16/19   [name], MD  pantoprazole (PROTONIX) 40 MG tablet Take 1 tablet (40 mg total) by mouth 2 (two) times daily before a meal. for 2 months, then once daily 11/16/19   [name], MD  rosuvastatin (CRESTOR) 10 MG tablet Take 1 tablet (10 mg total) by mouth daily. 01/21/20 01/20/21  Waynetta Sandy, MD  triamcinolone cream (KENALOG) 0.1 % Apply 1 application topically 2 (two) times daily as needed. To right arm for itching Patient taking differently: Apply 1 application topically 2 (two) times daily as needed (to right arm, for itching). 11/16/19   [name], MD   Physical Exam: KWIOXB with BMI 05/20/2020 05/20/2020 05/20/2020  Height - - -  Weight - - -  BMI - - -   Systolic 89 95 79  Diastolic 64 70 60  Pulse 99 110 99     1. General:  in No  Acute distress   Chronically ill -appearing 2. Psychological: Alert and  Oriented 3. Head/ENT:    Dry Mucous Membranes                          Head Non traumatic, neck supple                            Poor Dentition 4. SKIN:  decreased Skin turgor,  Skin clean Dry and intact no rash 5. Heart: Regular rate and rhythm no  Murmur, no Rub or gallop 6. Lungs:   no wheezes or crackles   7. Abdomen: Soft,   non-tender, Non distended  Thin   8. Lower extremities: no clubbing, cyanosis, no edema 9. Neurologically Grossly intact, moving all 4 extremities equally   10. MSK: Normal range of motion   All other LABS:     Recent Labs  Lab 05/19/20 1655  WBC 8.1  NEUTROABS 6.3  HGB 7.6*  HCT 22.8*  MCV 83.2  PLT 87*     Recent Labs  Lab 05/19/20 1336 05/19/20 1655 05/19/20 1748  NA 141  --   --   K 2.4* 2.1*  --   CL 109  --   --   CO2 13*  --   --   GLUCOSE 53*  --   --   BUN 31*  --   --   CREATININE 9.69*  --   --   CALCIUM 6.7*  --   --   MG  --   --  1.9  PHOS  --   --  7.2*     Recent Labs  Lab 05/19/20 1336  AST 28  ALT 14  ALKPHOS 57  BILITOT 1.0  PROT 3.9*  ALBUMIN 1.5*       Cultures:    Component Value Date/Time   SDES BLOOD RIGHT HAND 09/16/2018 0252   SPECREQUEST  09/16/2018 0252    BOTTLES DRAWN AEROBIC ONLY Blood Culture adequate volume   CULT  09/16/2018 0252    NO GROWTH 5 DAYS Performed at [facility], [place] [address], [place], [place] [address]    REPTSTATUS 09/21/2018 FINAL 09/16/2018 0252     Radiological Exams on Admission: DG Chest Port 1 View  Result Date: 05/19/2020 CLINICAL DATA:  History of GI bleed and missed dialysis session EXAM: PORTABLE CHEST 1 VIEW COMPARISON:  10/13/2019 FINDINGS: Cardiac shadow is within normal limits. Aortic calcifications are seen. The lungs are clear bilaterally. No bony abnormality is seen. IMPRESSION: No active  disease. Electronically Signed   By: Inez Catalina M.D.   On: 05/19/2020 11:55    Chart has been reviewed    Assessment/Plan   59 y.o. female with medical history significant of ESRD, HTN, anemia of chronic disease, PAD on Plavix, hx of Upper gi bleed, chronic diastolic CHF, asthma, atrial fibrillation not on anticoagulation secondary to recurrent bleeding, GERD, hepatitis C antibody positive, aortic insufficiency, history of pulmonary hypertension due to left ventricular diastolic dysfunction, tobacco abuse Admitted for  Gi bleed  Present on Admission: . Upper GI bleeding -  - [place] Blatch[place] score BUN >18.2   Hg < <60A systolic BP <540   melena   hepatic disease  cHF .  >1 Justifies admission and aggressive management      Modifying risk factors include:  hx of PUD   liver disease   Prior hx of GI bleed       -    AIMS 65 = Alb <3,  INR >1.5   SBP <90 (0-4) TOTAL of 3     Worrisome       -  hemodynamic instability present      - Admit to stepdown given above    -  ER  Provider spoke to gastroenterology (  [facility]) they will see patient in a.m. appreciate their consult   - serial CBC.    - Monitor for any recurrence,  evidence of hemodynamic instability or significant blood loss  - Transfuse as needed for hemoglobin below 7 or evidence of life-threatening bleeding  - Establish at least 2 PIV and fluid resuscitate   - clear liquids for tonight keep nothing by mouth post midnight,   -  administer Protonix  drip   -  Administer octreotide given possibility of variceal bleeding,   Given persistent hypotension PCCM team to admit to ICU overnight   . Tobacco abuse -  - Spoke about importance of quitting spent 5 minutes discussing  options for treatment, prior attempts at quitting, and dangers of smoking  -At this point patient is   NOT  interested in quitting right away  - order nicotine patch when stable  - nursing tobacco cessation protocol  . Thrombocytopenia ([address]) - may need  transfusion unclear etiology possibly due to underlining liver disease    . Symptomatic anemia - sp 2 units may need additional blood transfusion folow serial CBC  . Pulmonary hypertension due to left ventricular diastolic dysfunction (HCC) - chronic   . Hypokalemia -appreciate nephrology assistance given that patient hemodialysis  . Hepatic cirrhosis ([place]) - [place] from March 2021 showed fatty liver No evidence of cirrhosis at that time. Elevated INR low albumin and thrombocytopenia could be secondary to underlying liver disease ammonia probably further evaluation  . Gastroesophageal reflux disease on PPI  . Chronic diastolic (congestive) heart failure (HCC) -avoid fluid overload current appears to be in a dry side     . Hyperphosphatemia -appreciate nephrology input restart medications when able to Tolerate  . Hypoglycemia -monitor blood sugar and treat as needed  Hypoalbuminemia -check prealbumin unclear if hepatic nature versus malnutrition Order nutritional consult  Suspected malnutrition will order nutritional consult check prealbumin and administer thiamine  History of hepatitis C will obtain viral load levels  Other plan as per orders.  DVT prophylaxis:  SCD       Code Status:    Code Status: Full Code FULL CODE  as per patient   I had personally discussed CODE STATUS with patient      Family Communication:   Family not at  Bedside   Disposition Plan:    likely will need placement for rehabilitation                            Precautions: admitted as  Covid Negative     PPE: Used by the provider:    P100  eye Goggles,  Gloves     Makenli Derstine 05/20/2020, 1:47 AM    [facility]     after 2 AM please page floor coverage PA If 7AM-7PM, please contact the day team taking care of the patient using [email or web address]   Patient was evaluated in the context of the global COVID-19 pandemic, which necessitated consideration that the patient might be at  risk for infection with the SARS-CoV-2 virus that causes COVID-19. Institutional protocols and algorithms that pertain to the evaluation of patients at risk for COVID-19 are in a state of rapid change based on information released by regulatory bodies including the CDC and federal and state organizations. These policies and algorithms were followed during the patient's care.

## 2020-05-20 NOTE — H&P (View-Only) (Signed)
[facility] Consult: 9:09 AM 05/20/2020  LOS: 0 days    Referring Provider: Dr Harrell Gave.    Primary Care Physician:  [name], MD Primary Gastroenterologist: unassigned.  Has been seen by all GI groups/multiple GI providers in [place].    Reason for Consultation:  Diarrhea, GIB, anemia.     HPI: Karen Morrow is a 59 y.o. female.  PMH ESRD.  HD TTS.  Hypertension.  Anemia of chronic disease.  PVD, chronic Plavix.  Diastolic CHF.  Atrial fibrillation, not on AC due to recurrent bleeding.  Aortic insufficiency.  Pulmonary hypertension.  Asthma.  GERD.  Hepatitis C.  Required 2 PRBCs in 09/2019.  Multiple other PRBCs 2015, 2018, 2020,07/2019.   09/2018 EGD.  Mild gastritis.  Cratered 3 to 4 mm, nonbleeding gastric ulcer.  Nonbleeding duodenal diverticulum. 07/2019 EGD.  For melena, hematemesis, anemia.  4 cm HH, hemorrhagic gastritis, 15 mm cratered gastric ulcer with "dirty" appearing base but no definite VV.  Was not taking ulcer medication. Pathology from these procedures negative for H. Pylori, dysplasi, neoplasm.  09/2019 EGD.  For epigastric pain, hematemesis, melena.  Benign, moderate stenosis at GE junction traversable with scope.  Solitary nonbleeding, cratered gastric ulcer with pigmented material at base in gastric antrum.  Difficult to visualize due to marked antral edema and nodularity.  Lesion 12 to 15 mm at largest diameter, friable edges.  Diffuse congested mucosa throughout the remainder of the stomach..  Congested, moderate inflammation and erythema in duodenal bulb. Pathology from ulcer revealed healing erosion/ulcer.  Stains negative for H. pylori.  No malignancy  Patient is a poor historian, she cannot remember that she ever had an ulcer in fact she denies history of ulcer.  Her  husband says she has not been taking her meds "for months" her story is 2 days of diarrhea, watery, nonbloody stool.  She cannot remember the last time she went to dialysis.  Nephrology says last dialysis was 12/11.  Apparently unable to attend due to weakness and GI bleeding.  Notes report she soaked through 60 sanitary pads in attempt to absorb the stool.  Triage and other provider notes mention that this is been going on for about a week.  Patient denies abdominal pain, nausea.  Chronically poor appetite.  Endorses weight loss.  No syncope, no seizures. Unclear if she is taking any NSAIDs, aspirin products or has been compliant with PPI.  BP 89/64 Hgb 7.6 >> 2 PRBC >> 11.6.  MCV 83.  Platelets 87 >> 100.  INR 1.7. HCV quant 2,430,000 in 08/2018.   Current labs indicate hypokalemia. FOBT positive. C. difficile antigen is positive but C. difficile toxin negative.  This is exact same finding as in 10/18/2018.    Past Medical History:  Diagnosis Date  . Anginal pain ([place])   . Asthma   . Atrial fibrillation ([place])    not on AC  . CHF (congestive heart failure) ([place])   . Chronic kidney disease    dialysis 3x wk  . Dyspnea   . Frequent bowel movements   .  GERD (gastroesophageal reflux disease)   . Hepatitis C antibody test positive   . Hypertension    "just dx'd today" (11/03/2012)    Past Surgical History:  Procedure Laterality Date  . ABDOMINAL AORTOGRAM W/LOWER EXTREMITY Bilateral 01/21/2020   Procedure: ABDOMINAL AORTOGRAM W/LOWER EXTREMITY;  Surgeon: Waynetta Sandy, MD;  Location: [facility];  Service: Cardiovascular;  Laterality: Bilateral;  . ABDOMINAL HYSTERECTOMY     "partial" (11/03/2012)  . AV FISTULA PLACEMENT Right 12/03/2013   Procedure: CREATION OF ARTERIOVENOUS (AV) FISTULA RIGHT ARM ;  Surgeon: Rosetta Posner, MD;  Location: [place];  Service: Vascular;  Laterality: Right;  . AV FISTULA PLACEMENT Left 05/17/2018   Procedure: CREATION OF BRACHIOCEPHALIC FISTULA  LEFT ARM;  Surgeon: Waynetta Sandy, MD;  Location: [place];  Service: Vascular;  Laterality: Left;  . [place] REMOVAL Right 03/16/2018   Procedure: REMOVAL OF ARTERIOVENOUS GORETEX GRAFT ([place]) RIGHT ARM;  Surgeon: Waynetta Sandy, MD;  Location: [place];  Service: Vascular;  Laterality: Right;  . BASCILIC VEIN TRANSPOSITION Right 03/06/2014   Procedure: SECOND STAGE BASILIC VEIN TRANSPOSITION;  Surgeon: Rosetta Posner, MD;  Location: [place];  Service: Vascular;  Laterality: Right;  . BIOPSY  09/30/2018   Procedure: BIOPSY;  Surgeon: Mauri Pole, MD;  Location: [facility] END[facility]COPY;  Service: Endoscopy;;  . BIOPSY  08/01/2019   Procedure: BIOPSY;  Surgeon: Ronald Lobo, MD;  Location: [facility] ENDOSCOPY;  Service: Endoscopy;;  . BIOPSY  10/14/2019   Procedure: BIOPSY;  Surgeon: Doran Stabler, MD;  Location: [facility] ENDOSCOPY;  Service: Gastroenterology;;  . ENTEROSCOPY N/A 10/06/2018   Procedure: ENTEROSCOPY;  Surgeon: Lavena Bullion, DO;  Location: [place];  Service: Gastroenterology;  Laterality: N/A;  . ESOPHAGOGASTRODUODENOSCOPY N/A 06/05/2013   Procedure: ESOPHAGOGASTRODUODENOSCOPY (EGD);  Surgeon: Winfield Cunas., MD;  Location: [facility];  Service: Endoscopy;  Laterality: N/A;  . ESOPHAGOGASTRODUODENOSCOPY N/A 09/08/2016   Procedure: ESOPHAGOGASTRODUODENOSCOPY (EGD);  Surgeon: Milus Banister, MD;  Location: [place];  Service: Endoscopy;  Laterality: N/A;  . ESOPHAGOGASTRODUODENOSCOPY (EGD) WITH PROPOFOL N/A 09/17/2018   Procedure: ESOPHAGOGASTRODUODENOSCOPY (EGD) WITH PROPOFOL;  Surgeon: Carol Ada, MD;  Location: [place];  Service: Endoscopy;  Laterality: N/A;  . ESOPHAGOGASTRODUODENOSCOPY (EGD) WITH PROPOFOL N/A 09/30/2018   Procedure: ESOPHAGOGASTRODUODENOSCOPY (EGD) WITH PROPOFOL;  Surgeon: Mauri Pole, MD;  Location: [place] [facility];  Service: Endoscopy;  Laterality: N/A;  . ESOPHAGOGASTRODUODENOSCOPY (EGD) WITH PROPOFOL N/A 08/01/2019   Procedure:  ESOPHAGOGASTRODUODENOSCOPY (EGD) WITH PROPOFOL;  Surgeon: Ronald Lobo, MD;  Location: [place];  Service: Endoscopy;  Laterality: N/A;  . ESOPHAGOGASTRODUODENOSCOPY (EGD) WITH PROPOFOL N/A 10/14/2019   Procedure: ESOPHAGOGASTRODUODENOSCOPY (EGD) WITH PROPOFOL;  Surgeon: Doran Stabler, MD;  Location: [place];  Service: Gastroenterology;  Laterality: N/A;  . INSERTION OF DIALYSIS CATHETER Left 03/16/2018   Procedure: PLACEMENT OF TUNNEL DIALYSIS CATHETER;  Surgeon: Waynetta Sandy, MD;  Location: [place];  Service: Vascular;  Laterality: Left;  . INSERTION OF DIALYSIS CATHETER Right 09/26/2018   Procedure: INSERTION OF DIALYSIS  TUNNELED CATHETER;  Surgeon: Elam Dutch, MD;  Location[facility];  Service: Vascular;  Laterality: Right;  . IR REMOVAL TUN CV CATH W/O FL  09/17/2018  . PERIPHERAL VASCULAR INTERVENTION  01/21/2020   Procedure: PERIPHERAL VASCULAR INTERVENTION;  Surgeon: Waynetta Sandy, MD;  Location: [place] [facility];  Service: Cardiovascular;;  left SFA  . REVISON OF ARTERIOVENOUS FISTULA Right 01/28/2017   Procedure: REVISON OF RIGHT ARM ARTERIOVENOUS FISTULA USING  8MMX10CM GORETEX GRAFT;  Surgeon: Angelia Mould, MD;  Location: [facility] OR;  Service: Vascular;  Laterality: Right;    Prior to Admission medications   Medication Sig Start Date End Date Taking? Authorizing Provider  albuterol (PROVENTIL HFA;VENTOLIN HFA) 108 (90 Base) MCG/ACT inhaler TAKE 2 PUFFS BY MOUTH EVERY 6 HOURS AS NEEDED FOR WHEEZE OR SHORTNESS OF BREATH Patient taking differently: Inhale 2 puffs into the lungs every 6 (six) hours as needed for wheezing or shortness of breath. 05/09/18   [name], MD  B Complex-C-Zn-Folic Acid (DIALYVITE 400 WITH ZINC) 0.8 MG TABS Take 1 tablet by mouth daily.    [provider]  b complex-vitamin c-folic acid (NEPHRO-VITE) 0.8 MG TABS tablet Take 1 tablet by mouth every evening. Patient not taking: Reported on 05/19/2020  11/16/19   [name], MD  calcium acetate (PHOSLO) 667 MG capsule Take 2 capsules (1,334 mg total) by mouth 3 (three) times daily. 11/16/19   [name], MD  cinacalcet (SENSIPAR) 30 MG tablet TAKE 1 TABLET BY MOUTH DAILY EVERY EVENING (DO NOT TAKE LESS THAN 12 HOURS PRIOR TO DIALYSIS) Patient taking differently: Take 30 mg by mouth See admin instructions. Take 30 mg by mouth in the evening and NOT LESS THAN 12 HOURS PRIOR TO DIALYSIS 11/16/19   [name], MD  clopidogrel (PLAVIX) 75 MG tablet Take 1 tablet (75 mg total) by mouth daily. 02/06/20 02/05/21  [name], MD  ferric citrate (AURYXIA) 1 GM 210 MG(Fe) tablet Take 420 mg by mouth 3 (three) times daily with meals.    [provider]  lidocaine-prilocaine (EMLA) cream Apply 1 application topically See admin instructions. Apply as directed before dialysis 09/25/19   [provider]  metoprolol tartrate (LOPRESSOR) 25 MG tablet Take 0.5 tablets (12.5 mg total) by mouth 2 (two) times daily. 11/16/19   [name], MD  mirtazapine (REMERON) 7.5 MG tablet Take 7.5 mg by mouth at bedtime. 12/18/19   [provider]  nicotine (NICODERM CQ - DOSED IN MG/24 HOURS) 21 mg/24hr patch Place 1 patch (21 mg total) onto the skin daily. 11/16/19   [name], MD  pantoprazole (PROTONIX) 40 MG tablet Take 1 tablet (40 mg total) by mouth 2 (two) times daily before a meal. for 2 months, then once daily 11/16/19   [name], MD  rosuvastatin (CRESTOR) 10 MG tablet Take 1 tablet (10 mg total) by mouth daily. 01/21/20 01/20/21  Waynetta Sandy, MD  triamcinolone cream (KENALOG) 0.1 % Apply 1 application topically 2 (two) times daily as needed. To right arm for itching Patient taking differently: Apply 1 application topically 2 (two) times daily as needed (to right arm, for itching). 11/16/19   [name], MD    Scheduled Meds: . Chlorhexidine Gluconate Cloth  6 each Topical  [id number]  . cinacalcet  30 mg Oral Q supper  . doxercalciferol  2 mcg Intravenous Q T,Th,Sa-HD  . ferric citrate  420 mg Oral TID WC  . pantoprazole (PROTONIX) IV  40 mg Intravenous Q6H  . thiamine injection  100 mg Intravenous Daily   Infusions: . dextrose 50 mL/hr at 05/20/20 0404  . potassium chloride     PRN Meds: docusate sodium, ondansetron (ZOFRAN) IV, polyethylene glycol   Allergies as of 05/19/2020 - Review Complete 05/19/2020  Allergen Reaction Noted  . Cefazolin Other (See Comments) 12/05/2013  . Cephalosporins Anaphylaxis 03/07/2019  . Tuberculin Swelling and Other (See Comments) 03/07/2019  Family History  Problem Relation Age of Onset  . Lupus Mother   . Diabetes Father   . Heart attack Father 52    Social History   Socioeconomic History  . Marital status: Married    Spouse name: Not on file  . Number of children: Not on file  . Years of education: Not on file  . Highest education level: Not on file  Occupational History  . Occupation: disabled  Tobacco Use  . Smoking status: Current Every Day Smoker    Packs/day: 0.50    Years: 40.00    Pack years: 20.00    Types: Cigarettes  . Smokeless tobacco: Never Used  . Tobacco comment: now down to 1/2 ppd  Vaping Use  . Vaping Use: Never used  Substance and Sexual Activity  . Alcohol use: Not Currently    Alcohol/week: 0.0 standard drinks    Comment: never a heavy drinker, hasn't drank in years  . Drug use: Not Currently    Types: Marijuana    Comment: used to smoke once a day, remote h/o MJ  . Sexual activity: Not Currently  Other Topics Concern  . Not on file  Social History Narrative   Uses [employer] or her husband provides transportation- patient does not drive   Social Determinants of Radio broadcast assistant Strain: Not on file  Food Insecurity: Not on file  Transportation Needs: Not on file  Physical Activity: Not on file  Stress: Not on file  Social Connections: Not on file  Intimate  Partner Violence: Not on file    REVIEW OF SYSTEMS: Constitutional: Weakness ENT:  No nose bleeds Pulm: No shortness of breath.  No cough CV:  No palpitations, no LE edema.  No angina GU:  No hematuria, no frequency GI: See HPI Heme: No reports of unusual or excessive bleeding or bruising other than the diarrhea which may or may not have been dark or bloody. Transfusions: In past, 2 PRBCs overnight Neuro:  No headaches, no peripheral tingling or numbness.  No syncope, no seizures. Derm:  No itching, no rash or sores.  Endocrine:  No sweats or chills.  No polyuria or dysuria Immunization: Not queried. Travel:  None beyond local counties in last few months.    PHYSICAL EXAM: Vital signs in last 24 hours: Vitals:   05/20/20 0745 05/20/20 0822  BP: 92/66 91/63  Pulse:  82  Resp: 20 17  Temp: 98 F (36.7 C)   SpO2:  95%   Wt Readings from Last 3 Encounters:  05/19/20 71.7 kg  01/21/20 58.1 kg  01/11/20 44.5 kg    General: Patient is a cachectic, extremely chronically ill looking Head: No facial asymmetry or signs of head trauma.  No facial edema.  Multiple wartlike lesions around the eyes. Eyes: No conjunctival pallor.  No scleral icterus. Ears: Not hard of hearing Nose: No congestion or discharge Mouth: Oral mucosa is pink, moist, clear.  Extremely poor dentition with caries in the few remaining teeth present. Neck: No JVD, masses, thyromegaly Lungs: Diminished breath sounds but clear.  No labored breathing or cough. Heart: Irregularly irregular.   Abdomen:  Nontender, nondistended.  Active bowel sounds.  No masses. Rectal: Flexi-Seal is present.  There is watery/liquid brown stool present. Per ED PA there was foul-smelling black/dark green watery stool in the diaper on arrival. Musc/Skeltl: Osteopenic appearing.  Muscular wasting/sarcopenia. Extremities: Nonpitting pedal edema. Neurologic: Alert.  Not oriented to year but knows she is at the hospital.  Responds to her  name and can tell me she lives in [place] Skin: No obvious rashes, sores, suspicious lesions Nodes: No cervical adenopathy Psych: Flat, nonengaged affect.  Paucity of speech.  Intake/Output from previous day: 12/20 0701 - 12/21 0700 In: 2258.1 [I.V.:23.6; Blood:315; IV Piggyback:1919.5] Out: -  Intake/Output this shift: No intake/output data recorded.  LAB RESULTS: Recent Labs    05/19/20 1655 05/20/20 0400 05/20/20 0408  WBC 8.1 8.5  --   HGB 7.6* 11.6* 11.6*  HCT 22.8* 35.3* 34.0*  PLT 87* 100*  --    BMET Lab Results  Component Value Date   NA 140 05/20/2020   NA 140 05/20/2020   NA 141 05/19/2020   K 2.0 (LL) 05/20/2020   K 2.4 (LL) 05/20/2020   K 2.1 (LL) 05/19/2020   CL 103 05/20/2020   CL 109 05/19/2020   CL 98 01/21/2020   CO2 18 (L) 05/20/2020   CO2 13 (L) 05/19/2020   CO2 24 10/15/2019   GLUCOSE 66 (L) 05/20/2020   GLUCOSE 53 (L) 05/19/2020   GLUCOSE 72 01/21/2020   BUN 36 (H) 05/20/2020   BUN 31 (H) 05/19/2020   BUN 13 01/21/2020   CREATININE 10.97 (H) 05/20/2020   CREATININE 9.69 (H) 05/19/2020   CREATININE 4.30 (H) 01/21/2020   CALCIUM 8.3 (L) 05/20/2020   CALCIUM 6.7 (L) 05/19/2020   CALCIUM 8.7 (L) 10/15/2019   LFT Recent Labs    05/19/20 1336  PROT 3.9*  ALBUMIN 1.5*  AST 28  ALT 14  ALKPHOS 57  BILITOT 1.0   PT/INR Lab Results  Component Value Date   INR 1.7 (H) 05/19/2020   INR 1.0 10/13/2019   INR 1.3 (H) 08/01/2019   Hepatitis Panel No results for input(s): HEPBSAG, HCVAB, HEPAIGM, HEPBIGM in the last 72 hours. C-Diff No components found for: CDIFF Lipase     Component Value Date/Time   LIPASE 38 10/18/2018 1047    Drugs of Abuse     Component Value Date/Time   LABOPIA NONE DETECTED 11/26/2013 2021   COCAINSCRNUR NONE DETECTED 11/26/2013 2021   LABBENZ NONE DETECTED 11/26/2013 2021   AMPHETMU NONE DETECTED 11/26/2013 2021   THCU POSITIVE (A) 11/26/2013 2021   LABBARB NONE DETECTED 11/26/2013 2021      RADIOLOGY STUDIES: DG Chest Port 1 View  Result Date: 05/19/2020 CLINICAL DATA:  History of GI bleed and missed dialysis session EXAM: PORTABLE CHEST 1 VIEW COMPARISON:  10/13/2019 FINDINGS: Cardiac shadow is within normal limits. Aortic calcifications are seen. The lungs are clear bilaterally. No bony abnormality is seen. IMPRESSION: No active disease. Electronically Signed   By: Inez Catalina M.D.   On: 05/19/2020 11:55     IMPRESSION:   *   Normocytic anemia with likely recurrent GI bleed Suspect unhealed gastric ulcer is the source as this has been the source on multiple previous EGDs, one is recently as they 2021. Patient has not been taking any of her meds for several months per husband's report Vigorous response to 2 PRBCs with Hgb increase of 4 gm.   Has received a bolus of 80 mg of Protonix IV to be followed by Protonix 40 mg IV every 6 hours per hospitalist orders.  *    Hypokalemia.  *    Hypotension.  *    ESRD.  Missed recent dialysis sessions.  Last attended 12/11.  *    Atrial fibrillation.  Not on anticoagulation  *    Hepatitis C, untreated.  No  history of cirrhosis. LFTs normal.   At contrasted CTAP of 02/2018 had mild fatty liver. Noncontrast CTAP of 09/2018 had normal liver density and contours, normal biliary tree.  HCV quant ordered by hospitalist.  *   c diff Ag positive, c diff toxin negative.    *    Malnutrition, serum albumin 1.5.  It is obvious she is malnourished just eyeballing her without even looking at the labs.  *    Thrombocytopenia.  Intermittent hx of same.      PLAN:     *   EGD tomorrow.   Okay to have clear liquid diet today.  NPO after midnight.  Continue Protonix 40 mg IV twice daily rather than every 6 hours.  *    See that dietitian consult has been ordered.  Might want to have palliative care see this patient as well in order to sort out goals of care.   Azucena Freed  05/20/2020, 9:09 AM Phone [phone]

## 2020-05-20 NOTE — Consult Note (Signed)
[facility] Consult: 9:09 AM 05/20/2020  LOS: 0 days    Referring Provider: Dr Harrell Gave.    Primary Care Physician:  [name], MD Primary Gastroenterologist: unassigned.  Has been seen by all GI groups/multiple GI providers in [place].    Reason for Consultation:  Diarrhea, GIB, anemia.     HPI: Karen Morrow is a 59 y.o. female.  PMH ESRD.  HD TTS.  Hypertension.  Anemia of chronic disease.  PVD, chronic Plavix.  Diastolic CHF.  Atrial fibrillation, not on AC due to recurrent bleeding.  Aortic insufficiency.  Pulmonary hypertension.  Asthma.  GERD.  Hepatitis C.  Required 2 PRBCs in 09/2019.  Multiple other PRBCs 2015, 2018, 2020,07/2019.   09/2018 EGD.  Mild gastritis.  Cratered 3 to 4 mm, nonbleeding gastric ulcer.  Nonbleeding duodenal diverticulum. 07/2019 EGD.  For melena, hematemesis, anemia.  4 cm HH, hemorrhagic gastritis, 15 mm cratered gastric ulcer with "dirty" appearing base but no definite VV.  Was not taking ulcer medication. Pathology from these procedures negative for H. Pylori, dysplasi, neoplasm.  09/2019 EGD.  For epigastric pain, hematemesis, melena.  Benign, moderate stenosis at GE junction traversable with scope.  Solitary nonbleeding, cratered gastric ulcer with pigmented material at base in gastric antrum.  Difficult to visualize due to marked antral edema and nodularity.  Lesion 12 to 15 mm at largest diameter, friable edges.  Diffuse congested mucosa throughout the remainder of the stomach..  Congested, moderate inflammation and erythema in duodenal bulb. Pathology from ulcer revealed healing erosion/ulcer.  Stains negative for H. pylori.  No malignancy  Patient is a poor historian, she cannot remember that she ever had an ulcer in fact she denies history of ulcer.  Her  husband says she has not been taking her meds "for months" her story is 2 days of diarrhea, watery, nonbloody stool.  She cannot remember the last time she went to dialysis.  Nephrology says last dialysis was 12/11.  Apparently unable to attend due to weakness and GI bleeding.  Notes report she soaked through 60 sanitary pads in attempt to absorb the stool.  Triage and other provider notes mention that this is been going on for about a week.  Patient denies abdominal pain, nausea.  Chronically poor appetite.  Endorses weight loss.  No syncope, no seizures. Unclear if she is taking any NSAIDs, aspirin products or has been compliant with PPI.  BP 89/64 Hgb 7.6 >> 2 PRBC >> 11.6.  MCV 83.  Platelets 87 >> 100.  INR 1.7. HCV quant 2,430,000 in 08/2018.   Current labs indicate hypokalemia. FOBT positive. C. difficile antigen is positive but C. difficile toxin negative.  This is exact same finding as in 10/18/2018.    Past Medical History:  Diagnosis Date  . Anginal pain ([place])   . Asthma   . Atrial fibrillation ([place])    not on AC  . CHF (congestive heart failure) ([place])   . Chronic kidney disease    dialysis 3x wk  . Dyspnea   . Frequent bowel movements   .  GERD (gastroesophageal reflux disease)   . Hepatitis C antibody test positive   . Hypertension    "just dx'd today" (11/03/2012)    Past Surgical History:  Procedure Laterality Date  . ABDOMINAL AORTOGRAM W/LOWER EXTREMITY Bilateral 01/21/2020   Procedure: ABDOMINAL AORTOGRAM W/LOWER EXTREMITY;  Surgeon: Waynetta Sandy, MD;  Location: [facility];  Service: Cardiovascular;  Laterality: Bilateral;  . ABDOMINAL HYSTERECTOMY     "partial" (11/03/2012)  . AV FISTULA PLACEMENT Right 12/03/2013   Procedure: CREATION OF ARTERIOVENOUS (AV) FISTULA RIGHT ARM ;  Surgeon: Rosetta Posner, MD;  Location: [facility];  Service: Vascular;  Laterality: Right;  . AV FISTULA PLACEMENT Left 05/17/2018   Procedure: CREATION OF BRACHIOCEPHALIC FISTULA  LEFT ARM;  Surgeon: Waynetta Sandy, MD;  Location: [facility];  Service: Vascular;  Laterality: Left;  . [place] REMOVAL Right 03/16/2018   Procedure: REMOVAL OF ARTERIOVENOUS GORETEX GRAFT ([place]) RIGHT ARM;  Surgeon: Waynetta Sandy, MD;  Location: [facility];  Service: Vascular;  Laterality: Right;  . BASCILIC VEIN TRANSPOSITION Right 03/06/2014   Procedure: SECOND STAGE BASILIC VEIN TRANSPOSITION;  Surgeon: Rosetta Posner, MD;  Location: [facility];  Service: Vascular;  Laterality: Right;  . BIOPSY  09/30/2018   Procedure: BIOPSY;  Surgeon: Mauri Pole, MD;  Location[facility];  Service: Endoscopy;;  . BIOPSY  08/01/2019   Procedure: BIOPSY;  Surgeon: Ronald Lobo, MD;  Location: [facility] ENDOSCOPY;  Service: Endoscopy;;  . BIOPSY  10/14/2019   Procedure: BIOPSY;  Surgeon: Doran Stabler, MD;  Location: [facility] ENDOSCOPY;  Service: Gastroenterology;;  . ENTEROSCOPY N/A 10/06/2018   Procedure: ENTEROSCOPY;  Surgeon: Lavena Bullion, DO;  Location: [facility];  Service: Gastroenterology;  Laterality: N/A;  . ESOPHAGOGASTRODUODENOSCOPY N/A 06/05/2013   Procedure: ESOPHAGOGASTRODUODENOSCOPY (EGD);  Surgeon: Winfield Cunas., MD;  Location: [facility];  Service: Endoscopy;  Laterality: N/A;  . ESOPHAGOGASTRODUODENOSCOPY N/A 09/08/2016   Procedure: ESOPHAGOGASTRODUODENOSCOPY (EGD);  Surgeon: Milus Banister, MD;  Location: [place] [facility];  Service: Endoscopy;  Laterality: N/A;  . ESOPHAGOGASTRODUODENOSCOPY (EGD) WITH PROPOFOL N/A 09/17/2018   Procedure: ESOPHAGOGASTRODUODENOSCOPY (EGD) WITH PROPOFOL;  Surgeon: Carol Ada, MD;  Location: [facility];  Service: Endoscopy;  Laterality: N/A;  . ESOPHAGOGASTRODUODENOSCOPY (EGD) WITH PROPOFOL N/A 09/30/2018   Procedure: ESOPHAGOGASTRODUODENOSCOPY (EGD) WITH PROPOFOL;  Surgeon: Mauri Pole, MD;  Location: [facility];  Service: Endoscopy;  Laterality: N/A;  . ESOPHAGOGASTRODUODENOSCOPY (EGD) WITH PROPOFOL N/A 08/01/2019   Procedure:  ESOPHAGOGASTRODUODENOSCOPY (EGD) WITH PROPOFOL;  Surgeon: Ronald Lobo, MD;  Location: [facility];  Service: Endoscopy;  Laterality: N/A;  . ESOPHAGOGASTRODUODENOSCOPY (EGD) WITH PROPOFOL N/A 10/14/2019   Procedure: ESOPHAGOGASTRODUODENOSCOPY (EGD) WITH PROPOFOL;  Surgeon: Doran Stabler, MD;  Location: [facility];  Service: Gastroenterology;  Laterality: N/A;  . INSERTION OF DIALYSIS CATHETER Left 03/16/2018   Procedure: PLACEMENT OF TUNNEL DIALYSIS CATHETER;  Surgeon: Waynetta Sandy, MD;  Location: [facility];  Service: Vascular;  Laterality: Left;  . INSERTION OF DIALYSIS CATHETER Right 09/26/2018   Procedure: INSERTION OF DIALYSIS  TUNNELED CATHETER;  Surgeon: Elam Dutch, MD;  Location: [facility];  Service: Vascular;  Laterality: Right;  . IR REMOVAL TUN CV CATH W/O FL  09/17/2018  . PERIPHERAL VASCULAR INTERVENTION  01/21/2020   Procedure: PERIPHERAL VASCULAR INTERVENTION;  Surgeon: Waynetta Sandy, MD;  Location: [facility];  Service: Cardiovascular;;  left SFA  . REVISON OF ARTERIOVENOUS FISTULA Right 01/28/2017   Procedure: REVISON OF RIGHT ARM ARTERIOVENOUS FISTULA USING  8MMX10CM GORETEX GRAFT;  Surgeon: Angelia Mould, MD;  Location: [facility] OR;  Service: Vascular;  Laterality: Right;    Prior to Admission medications   Medication Sig Start Date End Date Taking? Authorizing Provider  albuterol (PROVENTIL HFA;VENTOLIN HFA) 108 (90 Base) MCG/ACT inhaler TAKE 2 PUFFS BY MOUTH EVERY 6 HOURS AS NEEDED FOR WHEEZE OR SHORTNESS OF BREATH Patient taking differently: Inhale 2 puffs into the lungs every 6 (six) hours as needed for wheezing or shortness of breath. 05/09/18   [name], MD  B Complex-C-Zn-Folic Acid (DIALYVITE 277 WITH ZINC) 0.8 MG TABS Take 1 tablet by mouth daily.    [provider]  b complex-vitamin c-folic acid (NEPHRO-VITE) 0.8 MG TABS tablet Take 1 tablet by mouth every evening. Patient not taking: Reported on 05/19/2020  11/16/19   [name], MD  calcium acetate (PHOSLO) 667 MG capsule Take 2 capsules (1,334 mg total) by mouth 3 (three) times daily. 11/16/19   [name], MD  cinacalcet (SENSIPAR) 30 MG tablet TAKE 1 TABLET BY MOUTH DAILY EVERY EVENING (DO NOT TAKE LESS THAN 12 HOURS PRIOR TO DIALYSIS) Patient taking differently: Take 30 mg by mouth See admin instructions. Take 30 mg by mouth in the evening and NOT LESS THAN 12 HOURS PRIOR TO DIALYSIS 11/16/19   [name], MD  clopidogrel (PLAVIX) 75 MG tablet Take 1 tablet (75 mg total) by mouth daily. 02/06/20 02/05/21  [name], MD  ferric citrate (AURYXIA) 1 GM 210 MG(Fe) tablet Take 420 mg by mouth 3 (three) times daily with meals.    [provider]  lidocaine-prilocaine (EMLA) cream Apply 1 application topically See admin instructions. Apply as directed before dialysis 09/25/19   [provider]  metoprolol tartrate (LOPRESSOR) 25 MG tablet Take 0.5 tablets (12.5 mg total) by mouth 2 (two) times daily. 11/16/19   [name], MD  mirtazapine (REMERON) 7.5 MG tablet Take 7.5 mg by mouth at bedtime. 12/18/19   [provider]  nicotine (NICODERM CQ - DOSED IN MG/24 HOURS) 21 mg/24hr patch Place 1 patch (21 mg total) onto the skin daily. 11/16/19   [name], MD  pantoprazole (PROTONIX) 40 MG tablet Take 1 tablet (40 mg total) by mouth 2 (two) times daily before a meal. for 2 months, then once daily 11/16/19   [name], MD  rosuvastatin (CRESTOR) 10 MG tablet Take 1 tablet (10 mg total) by mouth daily. 01/21/20 01/20/21  Waynetta Sandy, MD  triamcinolone cream (KENALOG) 0.1 % Apply 1 application topically 2 (two) times daily as needed. To right arm for itching Patient taking differently: Apply 1 application topically 2 (two) times daily as needed (to right arm, for itching). 11/16/19   [name], MD    Scheduled Meds: . Chlorhexidine Gluconate Cloth  6 each Topical  [id number]  . cinacalcet  30 mg Oral Q supper  . doxercalciferol  2 mcg Intravenous Q T,Th,Sa-HD  . ferric citrate  420 mg Oral TID WC  . pantoprazole (PROTONIX) IV  40 mg Intravenous Q6H  . thiamine injection  100 mg Intravenous Daily   Infusions: . dextrose 50 mL/hr at 05/20/20 0404  . potassium chloride     PRN Meds: docusate sodium, ondansetron (ZOFRAN) IV, polyethylene glycol   Allergies as of 05/19/2020 - Review Complete 05/19/2020  Allergen Reaction Noted  . Cefazolin Other (See Comments) 12/05/2013  . Cephalosporins Anaphylaxis 03/07/2019  . Tuberculin Swelling and Other (See Comments) 03/07/2019  Family History  Problem Relation Age of Onset  . Lupus Mother   . Diabetes Father   . Heart attack Father 23    Social History   Socioeconomic History  . Marital status: Married    Spouse name: Not on file  . Number of children: Not on file  . Years of education: Not on file  . Highest education level: Not on file  Occupational History  . Occupation: disabled  Tobacco Use  . Smoking status: Current Every Day Smoker    Packs/day: 0.50    Years: 40.00    Pack years: 20.00    Types: Cigarettes  . Smokeless tobacco: Never Used  . Tobacco comment: now down to 1/2 ppd  Vaping Use  . Vaping Use: Never used  Substance and Sexual Activity  . Alcohol use: Not Currently    Alcohol/week: 0.0 standard drinks    Comment: never a heavy drinker, hasn't drank in years  . Drug use: Not Currently    Types: Marijuana    Comment: used to smoke once a day, remote h/o MJ  . Sexual activity: Not Currently  Other Topics Concern  . Not on file  Social History Narrative   Uses [employer] or her husband provides transportation- patient does not drive   Social Determinants of Radio broadcast assistant Strain: Not on file  Food Insecurity: Not on file  Transportation Needs: Not on file  Physical Activity: Not on file  Stress: Not on file  Social Connections: Not on file  Intimate  Partner Violence: Not on file    REVIEW OF SYSTEMS: Constitutional: Weakness ENT:  No nose bleeds Pulm: No shortness of breath.  No cough CV:  No palpitations, no LE edema.  No angina GU:  No hematuria, no frequency GI: See HPI Heme: No reports of unusual or excessive bleeding or bruising other than the diarrhea which may or may not have been dark or bloody. Transfusions: In past, 2 PRBCs overnight Neuro:  No headaches, no peripheral tingling or numbness.  No syncope, no seizures. Derm:  No itching, no rash or sores.  Endocrine:  No sweats or chills.  No polyuria or dysuria Immunization: Not queried. Travel:  None beyond local counties in last few months.    PHYSICAL EXAM: Vital signs in last 24 hours: Vitals:   05/20/20 0745 05/20/20 0822  BP: 92/66 91/63  Pulse:  82  Resp: 20 17  Temp: 98 F (36.7 C)   SpO2:  95%   Wt Readings from Last 3 Encounters:  05/19/20 71.7 kg  01/21/20 58.1 kg  01/11/20 44.5 kg    General: Patient is a cachectic, extremely chronically ill looking Head: No facial asymmetry or signs of head trauma.  No facial edema.  Multiple wartlike lesions around the eyes. Eyes: No conjunctival pallor.  No scleral icterus. Ears: Not hard of hearing Nose: No congestion or discharge Mouth: Oral mucosa is pink, moist, clear.  Extremely poor dentition with caries in the few remaining teeth present. Neck: No JVD, masses, thyromegaly Lungs: Diminished breath sounds but clear.  No labored breathing or cough. Heart: Irregularly irregular.   Abdomen:  Nontender, nondistended.  Active bowel sounds.  No masses. Rectal: Flexi-Seal is present.  There is watery/liquid brown stool present. Per ED PA there was foul-smelling black/dark green watery stool in the diaper on arrival. Musc/Skeltl: Osteopenic appearing.  Muscular wasting/sarcopenia. Extremities: Nonpitting pedal edema. Neurologic: Alert.  Not oriented to year but knows she is at the hospital.  Responds to her  name and can tell me she lives in [place] Skin: No obvious rashes, sores, suspicious lesions Nodes: No cervical adenopathy Psych: Flat, nonengaged affect.  Paucity of speech.  Intake/Output from previous day: 12/20 0701 - 12/21 0700 In: 2258.1 [I.V.:23.6; Blood:315; IV Piggyback:1919.5] Out: -  Intake/Output this shift: No intake/output data recorded.  LAB RESULTS: Recent Labs    05/19/20 1655 05/20/20 0400 05/20/20 0408  WBC 8.1 8.5  --   HGB 7.6* 11.6* 11.6*  HCT 22.8* 35.3* 34.0*  PLT 87* 100*  --    BMET Lab Results  Component Value Date   NA 140 05/20/2020   NA 140 05/20/2020   NA 141 05/19/2020   K 2.0 (LL) 05/20/2020   K 2.4 (LL) 05/20/2020   K 2.1 (LL) 05/19/2020   CL 103 05/20/2020   CL 109 05/19/2020   CL 98 01/21/2020   CO2 18 (L) 05/20/2020   CO2 13 (L) 05/19/2020   CO2 24 10/15/2019   GLUCOSE 66 (L) 05/20/2020   GLUCOSE 53 (L) 05/19/2020   GLUCOSE 72 01/21/2020   BUN 36 (H) 05/20/2020   BUN 31 (H) 05/19/2020   BUN 13 01/21/2020   CREATININE 10.97 (H) 05/20/2020   CREATININE 9.69 (H) 05/19/2020   CREATININE 4.30 (H) 01/21/2020   CALCIUM 8.3 (L) 05/20/2020   CALCIUM 6.7 (L) 05/19/2020   CALCIUM 8.7 (L) 10/15/2019   LFT Recent Labs    05/19/20 1336  PROT 3.9*  ALBUMIN 1.5*  AST 28  ALT 14  ALKPHOS 57  BILITOT 1.0   PT/INR Lab Results  Component Value Date   INR 1.7 (H) 05/19/2020   INR 1.0 10/13/2019   INR 1.3 (H) 08/01/2019   Hepatitis Panel No results for input(s): HEPBSAG, HCVAB, HEPAIGM, HEPBIGM in the last 72 hours. C-Diff No components found for: CDIFF Lipase     Component Value Date/Time   LIPASE 38 10/18/2018 1047    Drugs of Abuse     Component Value Date/Time   LABOPIA NONE DETECTED 11/26/2013 2021   COCAINSCRNUR NONE DETECTED 11/26/2013 2021   LABBENZ NONE DETECTED 11/26/2013 2021   AMPHETMU NONE DETECTED 11/26/2013 2021   THCU POSITIVE (A) 11/26/2013 2021   LABBARB NONE DETECTED 11/26/2013 2021      RADIOLOGY STUDIES: DG Chest Port 1 View  Result Date: 05/19/2020 CLINICAL DATA:  History of GI bleed and missed dialysis session EXAM: PORTABLE CHEST 1 VIEW COMPARISON:  10/13/2019 FINDINGS: Cardiac shadow is within normal limits. Aortic calcifications are seen. The lungs are clear bilaterally. No bony abnormality is seen. IMPRESSION: No active disease. Electronically Signed   By: Inez Catalina M.D.   On: 05/19/2020 11:55     IMPRESSION:   *   Normocytic anemia with likely recurrent GI bleed Suspect unhealed gastric ulcer is the source as this has been the source on multiple previous EGDs, one is recently as they 2021. Patient has not been taking any of her meds for several months per husband's report Vigorous response to 2 PRBCs with Hgb increase of 4 gm.   Has received a bolus of 80 mg of Protonix IV to be followed by Protonix 40 mg IV every 6 hours per hospitalist orders.  *    Hypokalemia.  *    Hypotension.  *    ESRD.  Missed recent dialysis sessions.  Last attended 12/11.  *    Atrial fibrillation.  Not on anticoagulation  *    Hepatitis C, untreated.  No  history of cirrhosis. LFTs normal.   At contrasted CTAP of 02/2018 had mild fatty liver. Noncontrast CTAP of 09/2018 had normal liver density and contours, normal biliary tree.  HCV quant ordered by hospitalist.  *   c diff Ag positive, c diff toxin negative.    *    Malnutrition, serum albumin 1.5.  It is obvious she is malnourished just eyeballing her without even looking at the labs.  *    Thrombocytopenia.  Intermittent hx of same.      PLAN:     *   EGD tomorrow.   Okay to have clear liquid diet today.  NPO after midnight.  Continue Protonix 40 mg IV twice daily rather than every 6 hours.  *    See that dietitian consult has been ordered.  Might want to have palliative care see this patient as well in order to sort out goals of care.   Azucena Freed  05/20/2020, 9:09 AM Phone [phone]

## 2020-05-20 NOTE — Progress Notes (Signed)
Patient arrived to unit A/OX3. Patient not in any distress. Noted patient to have a flexiseal intact,patient buttocks raw and red, barrier cream applied to patient buttocks with relief. Patient remained on clear liquid diet and ate lunch and supper. Iv intact on right arm and Fistula intact with present bruit and thrill. Patient on IV fluids and no c/o of pain, stomach ache, or N/V  at the this time

## 2020-05-20 NOTE — ED Notes (Signed)
Attempted to call report to RN. RN unable to take report.

## 2020-05-20 NOTE — Progress Notes (Signed)
PROGRESS NOTE    FE OKUBO  GMW:[id number] DOB: Apr 13, 1961 DOA: 05/19/2020 PCP: [name], MD      Brief Narrative:  [name] is a 59 y.o. F with ESRD on HD TThS, Afib not on AC due to rec GIB, HTN, dCHF, and hep C who presented with 1 week rectal bleeding.   Over the last week, patient has been using pads because of leaking blood in stool.  Finally she was weak and   In the ER was hypotensive to 89/64 and hypogylcemic to 50s and so was started on pressors and given glucose.  Hgb 7.6 and bleeding, so given 2u PRBCs.  K < 3.      Assessment & Plan:  Hemorrhagic shock  Acute blood loss anemia Suspected GI bleed Patient admitted and had sustained hypotension requiring pressors.    Given transfusion and able to be weaned off pressors.  Of note, initial Hgb 7.6, repeat >11 x2 after transfusion.  Treated with IV pantoprazole and GI consulted.  Plan for EGD today. -Hold Plavix (patient states she doesn't take)   ESRD  -Consult nephrology, appreciate cares -Continue cinacalcet, vitamin D per Nephrology  Hypokalemia Due to diarrhea -Correct with HD today  Diarrhea C diff antigen positive Will defer treatment as only symptom is diarrhea.  If develops fever or cramps or high leukocytosis, would favor treamtent.     Atrial fibrillation, permanent Hypertension Chronic diastolic CHF -Hold metoprolol given hypotension -Continue Crestor  Hepatitis C -Check quant  Severe protein calorie malnutrition As evidneced by weight loss, poor appetite, loss of subcutaneous muscle mass and fat -Continue mirtazapine -Consult Palliative care       Disposition: Status is: Inpatient  Remains inpatient appropriate because:Inpatient level of care appropriate due to severity of illness   Dispo: The patient is from: Home              Anticipated d/c is to: Home              Anticipated d/c date is: 3 days              Patient currently is not medically stable  to d/c.              MDM: This is a no charge note.  For further details, please see H&P by my partner Dr. Roel Cluck from earlier today.  The below labs and imaging reports were reviewed and summarized above.    DVT prophylaxis: SCDs Start: 05/20/20 0116 Place and maintain sequential compression device Start: 05/19/20 2226  Code Status: FULL Family Communication:             Subjective: Paitent is tired.          Objective: Vitals:   05/20/20 0600 05/20/20 0645 05/20/20 0745 05/20/20 0822  BP: (!) 85/69 93/69 92/66  91/63  Pulse: 91 (!) 108  82  Resp: 20 (!) 24 20 17   Temp:   98 F (36.7 C)   TempSrc:   Oral   SpO2: 99% 99%  95%  Weight:      Height:        Intake/Output Summary (Last 24 hours) at 05/20/2020 0935 Last data filed at 05/20/2020 0248 Gross per 24 hour  Intake 2258.12 ml  Output --  Net 2258.12 ml   Filed Weights   05/19/20 1034  Weight: 71.7 kg    Examination: The patient was seen and examined.      Data Reviewed: I have personally reviewed  following labs and imaging studies:  CBC: Recent Labs  Lab 05/19/20 1655 05/20/20 0400 05/20/20 0408  WBC 8.1 8.5  --   NEUTROABS 6.3  --   --   HGB 7.6* 11.6* 11.6*  HCT 22.8* 35.3* 34.0*  MCV 83.2 83.8  --   PLT 87* 100*  --    Basic Metabolic Panel: Recent Labs  Lab 05/19/20 1336 05/19/20 1655 05/19/20 1748 05/20/20 0400 05/20/20 0408  NA 141  --   --  140 140  K 2.4* 2.1*  --  2.4* 2.0*  CL 109  --   --  103  --   CO2 13*  --   --  18*  --   GLUCOSE 53*  --   --  66*  --   BUN 31*  --   --  36*  --   CREATININE 9.69*  --   --  10.97*  --   CALCIUM 6.7*  --   --  8.3*  --   MG  --   --  1.9 1.8  --   PHOS  --   --  7.2* 6.5*  --    GFR: Estimated Creatinine Clearance: 5.3 mL/min (A) (by C-G formula based on SCr of 10.97 mg/dL (H)). Liver Function Tests: Recent Labs  Lab 05/19/20 1336  AST 28  ALT 14  ALKPHOS 57  BILITOT 1.0  PROT 3.9*  ALBUMIN 1.5*    No results for input(s): LIPASE, AMYLASE in the last 168 hours. Recent Labs  Lab 05/20/20 0545  AMMONIA 45*   Coagulation Profile: Recent Labs  Lab 05/19/20 1336  INR 1.7*   Cardiac Enzymes: Recent Labs  Lab 05/20/20 0545  CKTOTAL 207   BNP (last 3 results) No results for input(s): PROBNP in the last 8760 hours. HbA1C: No results for input(s): HGBA1C in the last 72 hours. CBG: Recent Labs  Lab 05/20/20 0059 05/20/20 0225 05/20/20 0355 05/20/20 0548 05/20/20 0850  GLUCAP 82 73 74 108* 95   Lipid Profile: No results for input(s): CHOL, HDL, LDLCALC, TRIG, CHOLHDL, LDLDIRECT in the last 72 hours. Thyroid Function Tests: No results for input(s): TSH, T4TOTAL, FREET4, T3FREE, THYROIDAB in the last 72 hours. Anemia Panel: No results for input(s): VITAMINB12, FOLATE, FERRITIN, TIBC, IRON, RETICCTPCT in the last 72 hours. Urine analysis:    Component Value Date/Time   COLORURINE YELLOW 11/26/2013 1819   APPEARANCEUR TURBID (A) 11/26/2013 1819   LABSPEC 1.021 11/26/2013 1819   PHURINE 5.5 11/26/2013 1819   GLUCOSEU NEGATIVE 11/26/2013 1819   HGBUR SMALL (A) 11/26/2013 1819   BILIRUBINUR NEGATIVE 11/26/2013 1819   KETONESUR NEGATIVE 11/26/2013 1819   PROTEINUR >300 (A) 11/26/2013 1819   UROBILINOGEN 0.2 11/26/2013 1819   NITRITE POSITIVE (A) 11/26/2013 1819   LEUKOCYTESUR MODERATE (A) 11/26/2013 1819   Sepsis Labs: @LABRCNTIP (procalcitonin:4,lacticacidven:4)  ) Recent Results (from the past 240 hour(s))  Resp Panel by RT-PCR (Flu A&B, Covid) Nasopharyngeal Swab     Status: None   Collection Time: 05/19/20 10:57 AM   Specimen: Nasopharyngeal Swab; Nasopharyngeal(NP) swabs in vial transport medium  Result Value Ref Range Status   SARS Coronavirus 2 by RT PCR NEGATIVE NEGATIVE Final    Comment: (NOTE) SARS-CoV-2 target nucleic acids are NOT DETECTED.  The SARS-CoV-2 RNA is generally detectable in upper respiratory specimens during the acute phase of  infection. The lowest concentration of SARS-CoV-2 viral copies this assay can detect is 138 copies/mL. A negative result does not preclude SARS-Cov-2 infection and  should not be used as the sole basis for treatment or other patient management decisions. A negative result may occur with  improper specimen collection/handling, submission of specimen other than nasopharyngeal swab, presence of viral mutation(s) within the areas targeted by this assay, and inadequate number of viral copies(<138 copies/mL). A negative result must be combined with clinical observations, patient history, and epidemiological information. The expected result is Negative.  Fact Sheet for Patients:  EntrepreneurPulse.com.au  Fact Sheet for Healthcare Providers:  IncredibleEmployment.be  This test is no t yet approved or cleared by the [place] FDA and  has been authorized for detection and/or diagnosis of SARS-CoV-2 by FDA under an Emergency Use Authorization (EUA). This EUA will remain  in effect (meaning this test can be used) for the duration of the COVID-19 declaration under Section 564(b)(1) of the Act, 21 U.S.C.section 360bbb-3(b)(1), unless the authorization is terminated  or revoked sooner.       Influenza A by PCR NEGATIVE NEGATIVE Final   Influenza B by PCR NEGATIVE NEGATIVE Final    Comment: (NOTE) The Xpert Xpress SARS-CoV-2/FLU/RSV plus assay is intended as an aid in the diagnosis of influenza from Nasopharyngeal swab specimens and should not be used as a sole basis for treatment. Nasal washings and aspirates are unacceptable for Xpert Xpress SARS-CoV-2/FLU/RSV testing.  Fact Sheet for Patients: EntrepreneurPulse.com.au  Fact Sheet for Healthcare Providers: IncredibleEmployment.be  This test is not yet approved or cleared by the [place] FDA and has been authorized for detection and/or diagnosis of SARS-CoV-2  by FDA under an Emergency Use Authorization (EUA). This EUA will remain in effect (meaning this test can be used) for the duration of the COVID-19 declaration under Section 564(b)(1) of the Act, 21 U.S.C. section 360bbb-3(b)(1), unless the authorization is terminated or revoked.  Performed at [facility], [place] [address] [place], [place] [address]   C Difficile Quick Screen w PCR reflex     Status: Abnormal   Collection Time: 05/19/20  5:47 PM   Specimen: STOOL  Result Value Ref Range Status   C Diff antigen POSITIVE (A) NEGATIVE Final   C Diff toxin NEGATIVE NEGATIVE Final   C Diff interpretation Results are indeterminate. See PCR results.  Final    Comment: Performed at [facility], [place] [address], [place], [place] [address]  C. Diff by PCR, Reflexed     Status: None   Collection Time: 05/19/20  5:47 PM  Result Value Ref Range Status   Toxigenic C. Difficile by PCR NEGATIVE NEGATIVE Final    Comment: Patient is colonized with non toxigenic C. difficile. May not need treatment unless significant symptoms are present. Performed at [facility], [place] [address] [place], [place] [address]          Radiology Studies: DG Chest Port 1 View  Result Date: 05/19/2020 CLINICAL DATA:  History of GI bleed and missed dialysis session EXAM: PORTABLE CHEST 1 VIEW COMPARISON:  10/13/2019 FINDINGS: Cardiac shadow is within normal limits. Aortic calcifications are seen. The lungs are clear bilaterally. No bony abnormality is seen. IMPRESSION: No active disease. Electronically Signed   By: Inez Catalina M.D.   On: 05/19/2020 11:55        Scheduled Meds: . Chlorhexidine Gluconate Cloth  6 each Topical Q0600  . cinacalcet  30 mg Oral Q supper  . doxercalciferol  2 mcg Intravenous Q T,Th,Sa-HD  . ferric citrate  420 mg Oral TID WC  . pantoprazole (PROTONIX) IV  40 mg Intravenous Q6H  . thiamine injection  100 mg Intravenous Daily   Continuous Infusions: . sodium  chloride 100 mL/hr at 05/20/20 0927  . dextrose 50 mL/hr at 05/20/20 0404     LOS: 0 days    Time spent: 30 minutes    Edwin Dada, MD [facility] 05/20/2020, 9:35 AM     Please page though [facility] or Epic secure chat:  For password, contact charge nurse

## 2020-05-21 ENCOUNTER — Encounter (HOSPITAL_COMMUNITY)
Admission: EM | Disposition: A | Discharge status: Hospice | Payer: Self-pay | Source: Home / Self Care | Attending: Internal Medicine

## 2020-05-21 ENCOUNTER — Inpatient Hospital Stay (HOSPITAL_COMMUNITY): Payer: Medicare Other | Admitting: Certified Registered"

## 2020-05-21 ENCOUNTER — Inpatient Hospital Stay (HOSPITAL_COMMUNITY): Payer: Medicare Other

## 2020-05-21 ENCOUNTER — Encounter (HOSPITAL_COMMUNITY): Payer: Self-pay | Admitting: Family Medicine

## 2020-05-21 DIAGNOSIS — N186 End stage renal disease: Secondary | ICD-10-CM | POA: Diagnosis not present

## 2020-05-21 DIAGNOSIS — D649 Anemia, unspecified: Secondary | ICD-10-CM | POA: Diagnosis not present

## 2020-05-21 DIAGNOSIS — K219 Gastro-esophageal reflux disease without esophagitis: Secondary | ICD-10-CM | POA: Diagnosis not present

## 2020-05-21 DIAGNOSIS — I5032 Chronic diastolic (congestive) heart failure: Secondary | ICD-10-CM | POA: Diagnosis not present

## 2020-05-21 DIAGNOSIS — K2970 Gastritis, unspecified, without bleeding: Secondary | ICD-10-CM | POA: Diagnosis present

## 2020-05-21 DIAGNOSIS — K922 Gastrointestinal hemorrhage, unspecified: Secondary | ICD-10-CM | POA: Diagnosis not present

## 2020-05-21 DIAGNOSIS — Z515 Encounter for palliative care: Secondary | ICD-10-CM

## 2020-05-21 DIAGNOSIS — K257 Chronic gastric ulcer without hemorrhage or perforation: Secondary | ICD-10-CM

## 2020-05-21 DIAGNOSIS — Z7189 Other specified counseling: Secondary | ICD-10-CM

## 2020-05-21 DIAGNOSIS — E876 Hypokalemia: Secondary | ICD-10-CM | POA: Diagnosis not present

## 2020-05-21 LAB — BASIC METABOLIC PANEL
Anion gap: 12 (ref 5–15)
Anion gap: 11 (ref 5–15)
BUN: 10 mg/dL (ref 6–20)
BUN: 11 mg/dL (ref 6–20)
CO2: 20 mmol/L — ABNORMAL LOW (ref 22–32)
CO2: 21 mmol/L — ABNORMAL LOW (ref 22–32)
Calcium: 7.7 mg/dL — ABNORMAL LOW (ref 8.9–10.3)
Calcium: 7.4 mg/dL — ABNORMAL LOW (ref 8.9–10.3)
Chloride: 103 mmol/L (ref 98–111)
Chloride: 104 mmol/L (ref 98–111)
Creatinine, Ser: 4.66 mg/dL — ABNORMAL HIGH (ref 0.44–1.00)
Creatinine, Ser: 4.7 mg/dL — ABNORMAL HIGH (ref 0.44–1.00)
GFR, Estimated: 10 mL/min — ABNORMAL LOW (ref >60)
GFR, Estimated: 10 mL/min — ABNORMAL LOW (ref >60)
Glucose, Bld: 86 mg/dL (ref 70–99)
Glucose, Bld: 63 mg/dL — ABNORMAL LOW (ref 70–99)
Potassium: 2.3 mmol/L — CL (ref 3.5–5.1)
Potassium: 2.9 mmol/L — ABNORMAL LOW (ref 3.5–5.1)
Sodium: 135 mmol/L (ref 135–145)
Sodium: 136 mmol/L (ref 135–145)

## 2020-05-21 LAB — CBC
HCT: 36.7 % (ref 36.0–46.0)
Hemoglobin: 12.9 g/dL (ref 12.0–15.0)
MCH: 28.7 pg (ref 26.0–34.0)
MCHC: 35.1 g/dL (ref 30.0–36.0)
MCV: 81.6 fL (ref 80.0–100.0)
Platelets: 66 10*3/uL — ABNORMAL LOW (ref 150–400)
RBC: 4.5 MIL/uL (ref 3.87–5.11)
RDW: 16.9 % — ABNORMAL HIGH (ref 11.5–15.5)
WBC: 7.5 10*3/uL (ref 4.0–10.5)
nRBC: 0 % (ref 0.0–0.2)

## 2020-05-21 LAB — GLUCOSE, CAPILLARY
Glucose-Capillary: 190 mg/dL — ABNORMAL HIGH (ref 70–99)
Glucose-Capillary: 58 mg/dL — ABNORMAL LOW (ref 70–99)
Glucose-Capillary: 68 mg/dL — ABNORMAL LOW (ref 70–99)
Glucose-Capillary: 70 mg/dL (ref 70–99)
Glucose-Capillary: 71 mg/dL (ref 70–99)
Glucose-Capillary: 84 mg/dL (ref 70–99)
Glucose-Capillary: 87 mg/dL (ref 70–99)
Glucose-Capillary: 92 mg/dL (ref 70–99)
Glucose-Capillary: 94 mg/dL (ref 70–99)
Glucose-Capillary: 95 mg/dL (ref 70–99)
Glucose-Capillary: 99 mg/dL (ref 70–99)
Glucose-Capillary: 99 mg/dL (ref 70–99)

## 2020-05-21 LAB — HEPATIC FUNCTION PANEL
ALT: 14 U/L (ref 0–44)
AST: 25 U/L (ref 15–41)
Albumin: 1.9 g/dL — ABNORMAL LOW (ref 3.5–5.0)
Alkaline Phosphatase: 61 U/L (ref 38–126)
Bilirubin, Direct: 0.4 mg/dL — ABNORMAL HIGH (ref 0.0–0.2)
Indirect Bilirubin: 0.4 mg/dL (ref 0.3–0.9)
Total Bilirubin: 0.8 mg/dL (ref 0.3–1.2)
Total Protein: 4.8 g/dL — ABNORMAL LOW (ref 6.5–8.1)

## 2020-05-21 LAB — HEPATITIS C VRS RNA DETECT BY PCR-QUAL: Hepatitis C Vrs RNA by PCR-Qual: POSITIVE — AB

## 2020-05-21 LAB — HAPTOGLOBIN: Haptoglobin: <10 mg/dL — ABNORMAL LOW (ref 33–346)

## 2020-05-21 LAB — PHOSPHORUS: Phosphorus: 2.1 mg/dL — ABNORMAL LOW (ref 2.5–4.6)

## 2020-05-21 LAB — MAGNESIUM: Magnesium: 1.7 mg/dL (ref 1.7–2.4)

## 2020-05-21 SURGERY — ESOPHAGOGASTRODUODENOSCOPY (EGD) WITH PROPOFOL
Anesthesia: Monitor Anesthesia Care

## 2020-05-21 MED ORDER — POTASSIUM CHLORIDE 10 MEQ/100ML IV SOLN
10.0000 meq | INTRAVENOUS | Status: AC
  Administered 2020-05-21 (×4): 10 meq via INTRAVENOUS
  Filled 2020-05-21 (×4): qty 100

## 2020-05-21 MED ORDER — POTASSIUM CHLORIDE CRYS ER 20 MEQ PO TBCR
40.0000 meq | EXTENDED_RELEASE_TABLET | Freq: Once | ORAL | Status: AC
  Administered 2020-05-21: 40 meq via ORAL
  Filled 2020-05-21: qty 2

## 2020-05-21 MED ORDER — DEXTROSE 50 % IV SOLN
1.0000 | Freq: Once | INTRAVENOUS | Status: AC
  Administered 2020-05-21: 50 mL via INTRAVENOUS

## 2020-05-21 MED ORDER — LIDOCAINE 2% (20 MG/ML) 5 ML SYRINGE
INTRAMUSCULAR | Status: DC | PRN
  Administered 2020-05-21: 20 mg via INTRAVENOUS

## 2020-05-21 MED ORDER — DEXTROSE 50 % IV SOLN
INTRAVENOUS | Status: AC
  Filled 2020-05-21: qty 50

## 2020-05-21 MED ORDER — SODIUM CHLORIDE 0.9 % IV SOLN: INTRAVENOUS | Status: DC | PRN

## 2020-05-21 MED ORDER — PROPOFOL 500 MG/50ML IV EMUL
INTRAVENOUS | Status: DC | PRN
  Administered 2020-05-21: 75 ug/kg/min via INTRAVENOUS

## 2020-05-21 SURGICAL SUPPLY — 15 items

## 2020-05-21 NOTE — Progress Notes (Signed)
VAST consulted to obtain second IV access. Pt with left arm restricted d/t fistula.  Right arm assessed with [place]. No appropriate veins for USGIV visualized. Attempted to place IV in right hand; patient complained of burning with NS flush; IV pulled.  Lab tech came in to collect blood sample and had difficulty as well.  Spoke with pt's nurse about contacting physician for central line placement. Melva, RN to contact physician.

## 2020-05-21 NOTE — Anesthesia Preprocedure Evaluation (Addendum)
Anesthesia Evaluation  Patient identified by MRN, date of birth, ID band Patient awake    Reviewed: Allergy & Precautions, NPO status , Patient's Chart, lab work & pertinent test results  Airway Mallampati: II  TM Distance: >3 FB Neck ROM: Full    Dental  (+) Dental Advisory Given   Pulmonary shortness of breath and with exertion, asthma , Current Smoker and Patient abstained from smoking.,  Chronic cough   Pulmonary exam normal breath sounds clear to auscultation (-) wheezing      Cardiovascular hypertension, Pt. on medications and Pt. on home beta blockers +CHF  Normal cardiovascular exam+ dysrhythmias Atrial Fibrillation + Valvular Problems/Murmurs MR and AI  Rhythm:Regular Rate:Normal + Systolic murmurs and + Diastolic murmurs Echo 01/7947 1. Left ventricular ejection fraction, by estimation, is 65 to 70%. The left ventricle has hyperdynamic function. The left ventricle has no regional wall motion abnormalities. There is severe left ventricular hypertrophy that appears concentric. Left ventricular diastolic parameters are consistent with Grade II diastolic dysfunction (pseudonormalization). There is mitral valve systolic anterior motion with significant LV outflow tract turbulence and a gradient that appears to be in the LVOT (and not MR) with peak 130 mmHg.  2. Right ventricular systolic function is normal. The right ventricular size is normal.  3. Left atrial size was moderately dilated.  4. The mitral valve is degenerative. There is systolic anterior motion of the anterior leaflet. Mild to moderate mitral valve regurgitation. Mild mitral stenosis. The mean mitral valve gradient is 4.0 mmHg. MVA by PHT 2.17 cm^2.  5. The aortic valve is tricuspid. Aortic valve regurgitation is moderate. Mild aortic valve sclerosis is present, with no evidence of aortic valve stenosis.  6. The inferior vena cava is dilated in size with <50%  respiratory variability, suggesting right atrial pressure of 15 mmHg.  7. No complete TR doppler jet so unable to estimate PA systolic pressure.  8. Markedly high LVOT gradient with mitral valve SAM and concentric LVH. Possible HCM variant.     Neuro/Psych negative neurological ROS     GI/Hepatic hiatal hernia, PUD, GERD  Medicated and Controlled,(+) Hepatitis -, C GIB    Endo/Other  negative endocrine ROS  Renal/GU ESRF and DialysisRenal disease  negative genitourinary   Musculoskeletal negative musculoskeletal ROS (+)   Abdominal   Peds  Hematology  (+) anemia ,   Anesthesia Other Findings Covid neg 5/15  Reproductive/Obstetrics                            Anesthesia Physical  Anesthesia Plan  ASA: IV  Anesthesia Plan: MAC   Post-op Pain Management:    Induction: Intravenous  PONV Risk Score and Plan: 2 and Propofol infusion and Treatment may vary due to age or medical condition  Airway Management Planned: Natural Airway and Nasal Cannula  Additional Equipment: None  Intra-op Plan:   Post-operative Plan:   Informed Consent: I have reviewed the patients History and Physical, chart, labs and discussed the procedure including the risks, benefits and alternatives for the proposed anesthesia with the patient or authorized representative who has indicated his/her understanding and acceptance.     Dental advisory given  Plan Discussed with: CRNA  Anesthesia Plan Comments:        Anesthesia Quick Evaluation

## 2020-05-21 NOTE — Anesthesia Procedure Notes (Signed)
Procedure Name: MAC Date/Time: 05/21/2020 3:25 PM Performed by: Barrington Ellison, CRNA Pre-anesthesia Checklist: Patient identified, Emergency Drugs available, Suction available, Patient being monitored and Timeout performed Patient Re-evaluated:Patient Re-evaluated prior to induction Oxygen Delivery Method: Nasal cannula

## 2020-05-21 NOTE — Consult Note (Signed)
Consultation Note Date: 05/21/2020   Patient Name: Karen Morrow  DOB: February 09, 1961  MRN: [id number]  Age / Sex: 59 y.o., female  PCP: [name], MD Referring Physician: Debbe Odea, MD  Reason for Consultation: Establishing goals of care  HPI/Patient Profile: 59 y.o. female  with past medical history of ESRD (dialysis on TTS), hypertension, anemia of chronic disease, chronic diastolic CHF, A-fib not on anticoagulation secondary to recurrent bleeding, GERD, PAD, hepatitis C antibody positive, aortic insufficiency, and pulmonary HTN due to left ventricular diastolic dysfunction. She presented to the emergency department on 05/19/2020 with GI bleeding x 1 week. Reported soaking multiple sanitary pads with bloody stool. Her last dialysis session was 12/11 ED Course: Patient is hypotensive and tachycardic. BUN 31, creatinine 9.69, potassium 2.4, glucose 53, albumin 1.5, hemoglobin 7.6, FOBT positive. Chest x-ray negative. C diff toxin negative but c diff antigen positive. She was started on protonix and octreotide infusions. Admitted to [facility] for further management of GI bleeding. Palliative care has been consulted to assist with goals of care.   Clinical Assessment and Goals of Care: I have reviewed medical records including EPIC notes, labs and imaging, received report from primary RN, and met at bedside with patient.    Patient appears weak and is unable and/or unwilling to engage in conversation at the time of my visit, other than answering a few simple yes or no questions.   I called her husband Yehuda Savannah to discuss diagnosis, prognosis, GOC, EOL wishes, disposition, and options.   I introduced Palliative Medicine as specialized medical care for people living with serious illness. It focuses on providing relief from the symptoms and stress of a serious illness.   We discussed a brief life review of  the patient. She is from [place]. She and Yehuda Savannah have been married for 21 years. Patient has 2 children from a previous relationship; a son who lives in [place] and a daughter who lives "within walking distance". They also have a daughter together; she lives in [place]. Patient also has a sister who lives in [place]. Yehuda Savannah shares that there is essentially no involvement or support from her family, with the exception of their mutual daughter who occasionally calls to check on her.   Patient has been on dialysis since 2014. As far as functional and nutritional status, Yehuda Savannah reports a decline over the past 4-5 months. He has to help her with bathing and dressing. For the past month, patient doesn't want to get out of bed. Her appetite has been very poor. Yehuda Savannah reports when she does want to eat, she only wants "junk food". We discussed that she has lost a significant amount of weight. He has noticed he "can see every bone in her body".     We discussed her current illness and what it means in the larger context of her ongoing co-morbidities.  Natural disease trajectory of ESRD and heart failure was discussed.  I attempted to elicit values and goals of care important to the patient. Yehuda Savannah shares that "if  it were up to her, she wouldn't even do dialysis". He ensures she gets to dialysis and is quite dedicated to taking care of her. He works full time here at [employer], and then cares for his wife when he gets home.     The difference between aggressive medical intervention and comfort care was considered in light of the patient's goals of care. At this time, Yehuda Savannah is interested in all aggressive medical interventions offered. We did discuss code status. I recommended they consider DNR/DNI status, understanding evidenced based poor outcomes in similar hospitalized patients, as the cause of the arrest is likely associated with chronic disease rather than a reversible acute cardio-pulmonary  event. For now, he wants her to remain full code.     I expressed concern regarding patient's declining overall state of health. I recommended meeting in person with Yehuda Savannah and patient to further discuss [place], hopefully when she is more able/willing to engage.  Questions and concerns were addressed.  I provided Yehuda Savannah with my contact information and encouraged him to call with questions or concerns.   Primary decision maker: Patient. Husband Yehuda Savannah would be her surrogate Media planner.     SUMMARY OF RECOMMENDATIONS   - full code, full scope for now (within ongoing discussion) - continue current medical care - patient's overall state of health is clearly declining - plan to meet with husband and patient tomorrow  Code Status/Advance Care Planning:  Full code  Symptom Management:   Per primary team  Palliative Prophylaxis:   Oral Care and Turn Reposition  Additional Recommendations (Limitations, Scope, Preferences):  Full Scope Treatment  Psycho-social/Spiritual:   Created space and opportunity for family to express thoughts and feelings regarding patient's current medical situation.   Emotional support provided   Prognosis:   Poor overall  Discharge Planning: To Be Determined      Primary Diagnoses: Present on Admission: . Upper GI bleeding . Tobacco abuse . Thrombocytopenia ([place]) . Symptomatic anemia . Pulmonary hypertension due to left ventricular diastolic dysfunction ([place]) . Hypokalemia . Hepatic cirrhosis ([place]) . Gastroesophageal reflux disease . Chronic diastolic (congestive) heart failure ([place]) . Abdominal pain . Hyperphosphatemia . Hypoglycemia . Upper GI bleed . Lower GI bleed   I have reviewed the medical record, interviewed the patient and family, and examined the patient. The following aspects are pertinent.  Past Medical History:  Diagnosis Date  . Anginal pain ([place])   . Asthma   . Atrial fibrillation ([place])    not on AC  .  CHF (congestive heart failure) ([place])   . Chronic kidney disease    dialysis 3x wk  . Dyspnea   . Frequent bowel movements   . GERD (gastroesophageal reflux disease)   . Hepatitis C antibody test positive   . Hypertension    "just dx'd today" (11/03/2012)   Social History   Socioeconomic History  . Marital status: Married    Spouse name: Not on file  . Number of children: Not on file  . Years of education: Not on file  . Highest education level: Not on file  Occupational History  . Occupation: disabled  Tobacco Use  . Smoking status: Current Every Day Smoker    Packs/day: 0.50    Years: 40.00    Pack years: 20.00    Types: Cigarettes  . Smokeless tobacco: Never Used  . Tobacco comment: now down to 1/2 ppd  Vaping Use  . Vaping Use: Never used  Substance and Sexual Activity  . Alcohol  use: Not Currently    Alcohol/week: 0.0 standard drinks    Comment: never a heavy drinker, hasn't drank in years  . Drug use: Not Currently    Types: Marijuana    Comment: used to smoke once a day, remote h/o MJ  . Sexual activity: Not Currently  Other Topics Concern  . Not on file  Social History Narrative   Uses [employer] or her husband provides transportation- patient does not drive    Family History  Problem Relation Age of Onset  . Lupus Mother   . Diabetes Father   . Heart attack Father 76   Scheduled Meds: . Chlorhexidine Gluconate Cloth  6 each Topical Q0600  . cinacalcet  30 mg Oral Q supper  . doxercalciferol  2 mcg Intravenous Q T,Th,Sa-HD  . ferric citrate  420 mg Oral TID WC  . mirtazapine  7.5 mg Oral QHS  . pantoprazole (PROTONIX) IV  40 mg Intravenous Q12H  . rosuvastatin  10 mg Oral Daily  . thiamine injection  100 mg Intravenous Daily   Continuous Infusions: . dextrose 50 mL/hr at 05/20/20 0404  . potassium chloride 10 mEq (05/21/20 0917)   PRN Meds:.docusate sodium, ondansetron (ZOFRAN) IV, polyethylene glycol Medications Prior to Admission:  Prior to Admission  medications   Medication Sig Start Date End Date Taking? Authorizing Provider  albuterol (PROVENTIL HFA;VENTOLIN HFA) 108 (90 Base) MCG/ACT inhaler TAKE 2 PUFFS BY MOUTH EVERY 6 HOURS AS NEEDED FOR WHEEZE OR SHORTNESS OF BREATH Patient taking differently: Inhale 2 puffs into the lungs every 6 (six) hours as needed for wheezing or shortness of breath. 05/09/18   [name], MD  B Complex-C-Zn-Folic Acid (DIALYVITE 324 WITH ZINC) 0.8 MG TABS Take 1 tablet by mouth daily.    [provider]  b complex-vitamin c-folic acid (NEPHRO-VITE) 0.8 MG TABS tablet Take 1 tablet by mouth every evening. Patient not taking: Reported on 05/19/2020 11/16/19   [name], MD  calcium acetate (PHOSLO) 667 MG capsule Take 2 capsules (1,334 mg total) by mouth 3 (three) times daily. 11/16/19   [name], MD  cinacalcet (SENSIPAR) 30 MG tablet TAKE 1 TABLET BY MOUTH DAILY EVERY EVENING (DO NOT TAKE LESS THAN 12 HOURS PRIOR TO DIALYSIS) Patient taking differently: Take 30 mg by mouth See admin instructions. Take 30 mg by mouth in the evening and NOT LESS THAN 12 HOURS PRIOR TO DIALYSIS 11/16/19   [name], MD  clopidogrel (PLAVIX) 75 MG tablet Take 1 tablet (75 mg total) by mouth daily. 02/06/20 02/05/21  [name], MD  ferric citrate (AURYXIA) 1 GM 210 MG(Fe) tablet Take 420 mg by mouth 3 (three) times daily with meals.    [provider]  lidocaine-prilocaine (EMLA) cream Apply 1 application topically See admin instructions. Apply as directed before dialysis 09/25/19   [provider]  metoprolol tartrate (LOPRESSOR) 25 MG tablet Take 0.5 tablets (12.5 mg total) by mouth 2 (two) times daily. 11/16/19   [name], MD  mirtazapine (REMERON) 7.5 MG tablet Take 7.5 mg by mouth at bedtime. 12/18/19   [provider]  nicotine (NICODERM CQ - DOSED IN MG/24 HOURS) 21 mg/24hr patch Place 1 patch (21 mg total) onto the skin daily. 11/16/19   [name], MD  pantoprazole (PROTONIX) 40 MG tablet Take 1 tablet (40 mg total) by mouth 2 (two) times daily before a meal. for 2 months, then once daily 11/16/19   Karle Plumber  B, MD  rosuvastatin (CRESTOR) 10 MG tablet Take 1 tablet (10 mg total) by mouth daily. 01/21/20 01/20/21  Waynetta Sandy, MD  triamcinolone cream (KENALOG) 0.1 % Apply 1 application topically 2 (two) times daily as needed. To right arm for itching Patient taking differently: Apply 1 application topically 2 (two) times daily as needed (to right arm, for itching). 11/16/19   [name], MD   Allergies  Allergen Reactions  . Cefazolin Other (See Comments)    Severe thrombocytopenia (has tolerated zosyn in the past) ID aware Tolerated in October 2019  . Cephalosporins Anaphylaxis  . Tuberculin Swelling and Other (See Comments)    Site of swelling not known   Review of Systems  Neurological: Positive for weakness.    Physical Exam Vitals reviewed.  Constitutional:      General: She is not in acute distress.    Appearance: She is cachectic. She is ill-appearing.  Cardiovascular:     Rate and Rhythm: Regular rhythm. Tachycardia present.  Pulmonary:     Effort: Pulmonary effort is normal.  Neurological:     Mental Status: She is lethargic.     Motor: Weakness present.  Psychiatric:        Mood and Affect: Affect is flat.     Vital Signs: BP 91/67 (BP Location: Right Arm)   Pulse 98   Temp 98.2 F (36.8 C) (Oral)   Resp 18   Ht '5\' 7"'  (1.702 m)   Wt 45.2 kg   SpO2 99%   BMI 15.61 kg/m  Pain Scale: 0-10   Pain Score: 0-No pain   SpO2: SpO2: 99 % O2 Device:SpO2: 99 % O2 Flow Rate: .   IO: Intake/output summary:   Intake/Output Summary (Last 24 hours) at 05/21/2020 2094 Last data filed at 05/21/2020 7096 Gross per 24 hour  Intake 1740.56 ml  Output 1715 ml  Net 25.56 ml    LBM: Last BM Date: 05/20/20 Baseline Weight: Weight: 71.7 kg Most recent weight: Weight: 45.2 kg       Palliative Assessment/Data: PPS 20-30%    Time In: 10:10 Time Out: 11:21 Time Total: 71 minutes Greater than 50%  of this time was spent counseling and coordinating care related to the above assessment and plan.  Signed by: Lavena Bullion, NP   Please contact Palliative Medicine Team phone at [phone] for questions and concerns.  For individual provider: See Shea Evans

## 2020-05-21 NOTE — Progress Notes (Signed)
Patient blood sugar dropped to 58 gave 1 ampule of 50%dextrose d10 @50  ml/hr running  Cbg up to 190.

## 2020-05-21 NOTE — Progress Notes (Signed)
PROGRESS NOTE    Karen Morrow   OMV:[id number]  DOB: 02/08/1961  DOA: 05/19/2020 PCP: [name], MD   Brief Narrative:  Karen Morrow is a 59 y.o. F with ESRD on HD TThS, Afib not on AC due to rec GIB, HTN, dCHF, and hep C, PAD on Plavix who presented with 1 week rectal bleeding for which she was using sanitary pads.   Over the last week, patient has been using pads because of leaking blood in stool.  In the ER was hypotensive to 89/64 and hypogylcemic to 50s and so was started on pressors and given glucose.  Hgb was 7.6 and she was given 2u PRBCs.   GI was consulted for the GI bleed.    Subjective: She is nauseated. Very poor historian. Thinks the nausea started today but is not sure. No vomiting. She states she does not have diarrhea but her RN states she has severe persistent diarrhea.     Assessment & Plan:   Active Problems: GI bleeding  - has h/o of PUD with a gastric ulcer noted on EGD in 09/2018, 3/21, 5/21 - per husband patient has not been taking her meds for months - EGD planned for today- on IV Protonix  - as of now, no longer has blood in stool  Hemorrhagic shock due to GI blood loss - resolved after 2 u PRBC - Hb 7.6 on admission and is now ~ 12.9  Diarrhea-  - has diarrhea today (per RN) - patient states she does not have any diarrhea - follow I and O as closely as possible - C diff antigen + but toxin neg- not treating as C diff at this time  FTT - per renal notes, the patient has had significant weight loss- ? If the ulcers noted on prior endoscopies could be limiting her oral intake - not sure how much the diarrhea is playing a part in this - f/u GI work up to see if there is something reversible  ESRD - appreciate f/u by renal team- per notes, she has a h/o being non-adherent to dialysis and has missed a week prior to this admission  Hypokalemia - ongoing despite a 4.0 K bath during dialysis on 12/21 - replacing today - follow  closely     Acute Thrombocytopenia  - Plt 193  (Noted on 09/2019 in Epic)- no prior history of low platelets - ? If due to acute blood loss- follow    Hepatic cirrhosis ? - noted on chart- fatty liver was noted on abdominal ultrasound on 07/2019- no mention of cirrhosis at this time  A-fib, permanent  - not on anticoagulation due to prior GI bleeding - Metoprolol on hold due to low BPs   Dementia?  - poor historian, not aware of even events pertaining to today, forgetting to take meds - follow    Time spent in minutes: 35 DVT prophylaxis: SCDs Start: 05/20/20 0116 Place and maintain sequential compression device Start: 05/19/20 2226  Code Status: Full code Family Communication:  Disposition Plan:  Status is: Inpatient  Remains inpatient appropriate because:IV treatments appropriate due to intensity of illness or inability to take PO   Dispo: The patient is from: Home              Anticipated d/c is to: TBD              Anticipated d/c date is: 2 days  Patient currently is not medically stable to d/c.      Consultants:   GI  Nephrology Procedures:   none Antimicrobials:  Anti-infectives (From admission, onward)   None       Objective: Vitals:   05/21/20 0426 05/21/20 0541 05/21/20 0711 05/21/20 1305  BP: 90/66 93/68 91/67  96/72  Pulse: (!) 105 (!) 108 98 (!) 102  Resp: 18 18 18  (!) 8  Temp: (!) 97.5 F (36.4 C) 98.7 F (37.1 C) 98.2 F (36.8 C) 98.7 F (37.1 C)  TempSrc: Oral Oral Oral Oral  SpO2: 94% 100% 99% 100%  Weight: 45.2 kg     Height:        Intake/Output Summary (Last 24 hours) at 05/21/2020 1459 Last data filed at 05/21/2020 1400 Gross per 24 hour  Intake 2140.25 ml  Output 1700 ml  Net 440.25 ml   Filed Weights   05/19/20 1034 05/20/20 1008 05/21/20 0426  Weight: 71.7 kg 44.9 kg 45.2 kg    Examination: General exam: Appears uncomfortable  HEENT: PERRLA, oral mucosa moist, no sclera icterus or  thrush Respiratory system: Clear to auscultation. Respiratory effort normal. Cardiovascular system: S1 & S2 heard, RRR.   Gastrointestinal system: Abdomen soft, non-tender, nondistended. Normal bowel sounds. Central nervous system: Alert and oriented. No focal neurological deficits. Extremities: No cyanosis, clubbing or edema Skin: No rashes or ulcers Psychiatry:  Flat affect    Data Reviewed: I have personally reviewed following labs and imaging studies  CBC: Recent Labs  Lab 05/19/20 1655 05/20/20 0400 05/20/20 0408 05/20/20 1644 05/21/20 0310  WBC 8.1 8.5  --   --  7.5  NEUTROABS 6.3  --   --   --   --   HGB 7.6* 11.6* 11.6* 11.8* 12.9  HCT 22.8* 35.3* 34.0* 34.4* 36.7  MCV 83.2 83.8  --   --  81.6  PLT 87* 100*  --   --  66*   Basic Metabolic Panel: Recent Labs  Lab 05/19/20 1336 05/19/20 1655 05/19/20 1748 05/20/20 0400 05/20/20 0408 05/21/20 0310 05/21/20 1248  NA 141  --   --  140 140 135 136  K 2.4* 2.1*  --  2.4* 2.0* 2.3* 2.9*  CL 109  --   --  103  --  103 104  CO2 13*  --   --  18*  --  20* 21*  GLUCOSE 53*  --   --  66*  --  86 63*  BUN 31*  --   --  36*  --  10 11  CREATININE 9.69*  --   --  10.97*  --  4.66* 4.70*  CALCIUM 6.7*  --   --  8.3*  --  7.7* 7.4*  MG  --   --  1.9 1.8  --  1.7  --   PHOS  --   --  7.2* 6.5*  --  2.1*  --    GFR: Estimated Creatinine Clearance: 9.1 mL/min (A) (by C-G formula based on SCr of 4.7 mg/dL (H)). Liver Function Tests: Recent Labs  Lab 05/19/20 1336  AST 28  ALT 14  ALKPHOS 57  BILITOT 1.0  PROT 3.9*  ALBUMIN 1.5*   No results for input(s): LIPASE, AMYLASE in the last 168 hours. Recent Labs  Lab 05/20/20 0545  AMMONIA 45*   Coagulation Profile: Recent Labs  Lab 05/19/20 1336  INR 1.7*   Cardiac Enzymes: Recent Labs  Lab 05/20/20 0545  CKTOTAL 207   BNP (  last 3 results) No results for input(s): PROBNP in the last 8760 hours. HbA1C: No results for input(s): HGBA1C in the last 72  hours. CBG: Recent Labs  Lab 05/21/20 0222 05/21/20 0422 05/21/20 0612 05/21/20 0756 05/21/20 1026  GLUCAP 95 92 87 99 71   Lipid Profile: No results for input(s): CHOL, HDL, LDLCALC, TRIG, CHOLHDL, LDLDIRECT in the last 72 hours. Thyroid Function Tests: No results for input(s): TSH, T4TOTAL, FREET4, T3FREE, THYROIDAB in the last 72 hours. Anemia Panel: No results for input(s): VITAMINB12, FOLATE, FERRITIN, TIBC, IRON, RETICCTPCT in the last 72 hours. Urine analysis:    Component Value Date/Time   COLORURINE YELLOW 11/26/2013 1819   APPEARANCEUR TURBID (A) 11/26/2013 1819   LABSPEC 1.021 11/26/2013 1819   PHURINE 5.5 11/26/2013 1819   GLUCOSEU NEGATIVE 11/26/2013 1819   HGBUR SMALL (A) 11/26/2013 1819   BILIRUBINUR NEGATIVE 11/26/2013 1819   KETONESUR NEGATIVE 11/26/2013 1819   PROTEINUR >300 (A) 11/26/2013 1819   UROBILINOGEN 0.2 11/26/2013 1819   NITRITE POSITIVE (A) 11/26/2013 1819   LEUKOCYTESUR MODERATE (A) 11/26/2013 1819   Sepsis Labs: @LABRCNTIP (procalcitonin:4,lacticidven:4) ) Recent Results (from the past 240 hour(s))  Resp Panel by RT-PCR (Flu A&B, Covid) Nasopharyngeal Swab     Status: None   Collection Time: 05/19/20 10:57 AM   Specimen: Nasopharyngeal Swab; Nasopharyngeal(NP) swabs in vial transport medium  Result Value Ref Range Status   SARS Coronavirus 2 by RT PCR NEGATIVE NEGATIVE Final    Comment: (NOTE) SARS-CoV-2 target nucleic acids are NOT DETECTED.  The SARS-CoV-2 RNA is generally detectable in upper respiratory specimens during the acute phase of infection. The lowest concentration of SARS-CoV-2 viral copies this assay can detect is 138 copies/mL. A negative result does not preclude SARS-Cov-2 infection and should not be used as the sole basis for treatment or other patient management decisions. A negative result may occur with  improper specimen collection/handling, submission of specimen other than nasopharyngeal swab, presence of viral  mutation(s) within the areas targeted by this assay, and inadequate number of viral copies(<138 copies/mL). A negative result must be combined with clinical observations, patient history, and epidemiological information. The expected result is Negative.  Fact Sheet for Patients:  EntrepreneurPulse.com.au  Fact Sheet for Healthcare Providers:  IncredibleEmployment.be  This test is no t yet approved or cleared by the [place] FDA and  has been authorized for detection and/or diagnosis of SARS-CoV-2 by FDA under an Emergency Use Authorization (EUA). This EUA will remain  in effect (meaning this test can be used) for the duration of the COVID-19 declaration under Section 564(b)(1) of the Act, 21 U.S.C.section 360bbb-3(b)(1), unless the authorization is terminated  or revoked sooner.       Influenza A by PCR NEGATIVE NEGATIVE Final   Influenza B by PCR NEGATIVE NEGATIVE Final    Comment: (NOTE) The Xpert Xpress SARS-CoV-2/FLU/RSV plus assay is intended as an aid in the diagnosis of influenza from Nasopharyngeal swab specimens and should not be used as a sole basis for treatment. Nasal washings and aspirates are unacceptable for Xpert Xpress SARS-CoV-2/FLU/RSV testing.  Fact Sheet for Patients: EntrepreneurPulse.com.au  Fact Sheet for Healthcare Providers: IncredibleEmployment.be  This test is not yet approved or cleared by the [place] FDA and has been authorized for detection and/or diagnosis of SARS-CoV-2 by FDA under an Emergency Use Authorization (EUA). This EUA will remain in effect (meaning this test can be used) for the duration of the COVID-19 declaration under Section 564(b)(1) of the Act, 21 U.S.C. section  360bbb-3(b)(1), unless the authorization is terminated or revoked.  Performed at [facility], [place] [address], [place], [place] [address]   C Difficile Quick Screen w PCR  reflex     Status: Abnormal   Collection Time: 05/19/20  5:47 PM   Specimen: STOOL  Result Value Ref Range Status   C Diff antigen POSITIVE (A) NEGATIVE Final   C Diff toxin NEGATIVE NEGATIVE Final   C Diff interpretation Results are indeterminate. See PCR results.  Final    Comment: Performed at [facility], [place] [address] [place], [place] [address]  Gastrointestinal Panel by PCR , Stool     Status: None   Collection Time: 05/19/20  5:47 PM   Specimen: STOOL  Result Value Ref Range Status   Campylobacter species NOT DETECTED NOT DETECTED Final   Plesimonas shigelloides NOT DETECTED NOT DETECTED Final   Salmonella species NOT DETECTED NOT DETECTED Final   Yersinia enterocolitica NOT DETECTED NOT DETECTED Final   Vibrio species NOT DETECTED NOT DETECTED Final   Vibrio cholerae NOT DETECTED NOT DETECTED Final   Enteroaggregative E coli (EAEC) NOT DETECTED NOT DETECTED Final   Enteropathogenic E coli (EPEC) NOT DETECTED NOT DETECTED Final   Enterotoxigenic E coli (ETEC) NOT DETECTED NOT DETECTED Final   Shiga like toxin producing E coli (STEC) NOT DETECTED NOT DETECTED Final   Shigella/Enteroinvasive E coli (EIEC) NOT DETECTED NOT DETECTED Final   Cryptosporidium NOT DETECTED NOT DETECTED Final   Cyclospora cayetanensis NOT DETECTED NOT DETECTED Final   Entamoeba histolytica NOT DETECTED NOT DETECTED Final   Giardia lamblia NOT DETECTED NOT DETECTED Final   Adenovirus F40/41 NOT DETECTED NOT DETECTED Final   Astrovirus NOT DETECTED NOT DETECTED Final   Norovirus GI/GII NOT DETECTED NOT DETECTED Final   Rotavirus A NOT DETECTED NOT DETECTED Final   Sapovirus (I, II, IV, and V) NOT DETECTED NOT DETECTED Final    Comment: Performed at [facility], [address] [place], [place] [address]  C. Diff by PCR, Reflexed     Status: None   Collection Time: 05/19/20  5:47 PM  Result Value Ref Range Status   Toxigenic C. Difficile by PCR NEGATIVE NEGATIVE Final    Comment:  Patient is colonized with non toxigenic C. difficile. May not need treatment unless significant symptoms are present. Performed at [facility], [place] [address] [place], [place] [address]          Radiology Studies: DG Chest Port 1 View  Result Date: 05/21/2020 CLINICAL DATA:  Shortness of breath EXAM: PORTABLE CHEST 1 VIEW COMPARISON:  May 19, 2020 FINDINGS: There is ill-defined airspace opacity in the left mid lung and left base regions and more subtly in the right lower lung region. No consolidation. There is left base atelectasis. The heart is upper normal in size with pulmonary vascularity normal. No adenopathy. There is aortic atherosclerosis. No bone lesions. There is calcification in each carotid artery. IMPRESSION: Ill-defined airspace opacity in the left mid lung and left base regions and to a lesser degree in the right lower lung region. Appearance concerning for multifocal pneumonia. Advise correlation with COVID-19 status in this regard. There is also atelectasis in the left base. Stable cardiac silhouette.  No adenopathy. Aortic Atherosclerosis (ICD10-I70.0). Electronically Signed   By: Lowella Grip III M.D.   On: 05/21/2020 08:13      Scheduled Meds: . [MAR Hold] Chlorhexidine Gluconate Cloth  6 each Topical Q0600  . [facility]  Hold] cinacalcet  30 mg Oral Q supper  . [MAR Hold] doxercalciferol  2 mcg Intravenous Q T,Th,Sa-HD  . [MAR Hold] ferric citrate  420 mg Oral TID WC  . [MAR Hold] mirtazapine  7.5 mg Oral QHS  . [MAR Hold] pantoprazole (PROTONIX) IV  40 mg Intravenous Q12H  . [MAR Hold] rosuvastatin  10 mg Oral Daily  . [MAR Hold] thiamine injection  100 mg Intravenous Daily   Continuous Infusions: . dextrose 50 mL/hr at 05/21/20 1421     LOS: 1 day      Debbe Odea, MD [facility] Pager: [email or web address] 05/21/2020, 2:59 PM

## 2020-05-21 NOTE — Progress Notes (Signed)
Patient is alert and orient x4 during report patient expressed that she has stopped bleeding and that she does does not want an EGD done. Patient remains on D10 and CBG is 87.

## 2020-05-21 NOTE — Progress Notes (Signed)
Pt back in room from endo procedure.Spoke ith Dialysis nurse and patient have dialysis treatment  tomorrow.

## 2020-05-21 NOTE — Progress Notes (Signed)
[facility] Progress Note   Assessment/ Plan:   Dialysis Orders: [place] T,Th,S 3:45 hr 180NRe 350/600 42 kg 3.0K/2.25 Ca AVF -Mircera 50 mcg IV q 2 weeks -Hectorol 2 mcg IV q 2 weeks -Venofer 50 mg IV weekly  Assessment/Plan: 1.  Upper GIB with ABLA-Per primary/GIB. Tentatively scheduled for EGD today if we can get K up HGB 7.6 on adm S/P 2 units PRBCs.  2.  C difficile- Ag positive but toxin negative.  GI pathogen panel negative too.  Per primary/ GI 3.  Hypokalemia-being repleted. Use 4.0 K bath with HD.  4.  Failure to thrive-PCM, continued weight loss. Hypoglycemia requiring D10w in ED. Greatly appreciate palliative care 5.  ESRD - T,Th,S No HD for one week. HD overnight Monday, next tomorrow (Thurs).  6.  Hypertension/volume- continue IVFs to keep up with GI losses 7.  Anemia  -See # 1. Follow HGB, transfuse as needed.  8.  Metabolic bone disease -  C Ca OK. PO4 elevated. Continue VDRA, binders when eating.  9.  Nutrition - Clear liquids at present. Albumin 1.5. Regular diet, protein supp, renal vit. When able to eat.  10. H/O Hep C 11.  Dispo: Overall I'm very concerned that this represents a continued decline.  She has had significant weight loss in the past year, isn't taking her medications, and isn't eating.  She is also missing more and more dialysis.  I greatly appreciate palliative care.     Subjective:    Seen in room.  Talked with her husband at bedside.  He shares that she hasn't taken any of her medicines in about 3 months.  Doesn't eat, takes about 2-3 bites of food and then is full.  She also is missing dialysis more and more frequently.  Initially declined IV K repletion but is now receiving it.     Objective:   BP 91/67 (BP Location: Right Arm)   Pulse 98   Temp 98.2 F (36.8 C) (Oral)   Resp 18   Ht 5\' 7"  (1.702 m)   Wt 45.2 kg   SpO2 99%   BMI 15.61 kg/m   Physical Exam: Gen: frail and cachectic CVS: RRR Resp: clear Abd: thin and soft,  hyperactive BS Ext: no LE edema ACCESS: L AVG + T/B  Labs: BMET Recent Labs  Lab 05/19/20 1336 05/19/20 1655 05/19/20 1748 05/20/20 0400 05/20/20 0408 05/21/20 0310  NA 141  --   --  140 140 135  K 2.4* 2.1*  --  2.4* 2.0* 2.3*  CL 109  --   --  103  --  103  CO2 13*  --   --  18*  --  20*  GLUCOSE 53*  --   --  66*  --  86  BUN 31*  --   --  36*  --  10  CREATININE 9.69*  --   --  10.97*  --  4.66*  CALCIUM 6.7*  --   --  8.3*  --  7.7*  PHOS  --   --  7.2* 6.5*  --  2.1*   CBC Recent Labs  Lab 05/19/20 1655 05/20/20 0400 05/20/20 0408 05/20/20 1644 05/21/20 0310  WBC 8.1 8.5  --   --  7.5  NEUTROABS 6.3  --   --   --   --   HGB 7.6* 11.6* 11.6* 11.8* 12.9  HCT 22.8* 35.3* 34.0* 34.4* 36.7  MCV 83.2 83.8  --   --  81.6  PLT 87* 100*  --   --  66*      Medications:    . Chlorhexidine Gluconate Cloth  6 each Topical Q0600  . cinacalcet  30 mg Oral Q supper  . doxercalciferol  2 mcg Intravenous Q T,Th,Sa-HD  . ferric citrate  420 mg Oral TID WC  . mirtazapine  7.5 mg Oral QHS  . pantoprazole (PROTONIX) IV  40 mg Intravenous Q12H  . rosuvastatin  10 mg Oral Daily  . thiamine injection  100 mg Intravenous Daily     Madelon Lips, MD 05/21/2020, 10:44 AM

## 2020-05-21 NOTE — Op Note (Addendum)
[facility] Patient Name: Karen Morrow Procedure Date : 05/21/2020 MRN: [id number] Attending MD: Gatha Mayer , MD Date of Birth: [birth year] CSN: [id number] Age: 59 Admit Type: Inpatient Procedure:                Upper GI endoscopy Indications:              Chronic gastric ulcer, Follow-up of chronic gastric                            ulcer, Anemia Providers:                Gatha Mayer, MD, Clyde Lundborg, RN, Elspeth Cho Tech., Technician, Sampson Si, CRNA Referring MD:              Medicines:                Propofol per Anesthesia, Monitored Anesthesia Care Complications:            No immediate complications. Estimated Blood Loss:     Estimated blood loss was minimal. Procedure:                Pre-Anesthesia Assessment:                           - Prior to the procedure, a History and Physical                            was performed, and patient medications and                            allergies were reviewed. The patient's tolerance of                            previous anesthesia was also reviewed. The risks                            and benefits of the procedure and the sedation                            options and risks were discussed with the patient.                            All questions were answered, and informed consent                            was obtained. Prior Anticoagulants: The patient has                            taken no previous anticoagulant or antiplatelet                            agents. ASA Grade Assessment: IV - A patient with  severe systemic disease that is a constant threat                            to life. After reviewing the risks and benefits,                            the patient was deemed in satisfactory condition to                            undergo the procedure.                           After obtaining informed consent, the endoscope was                             passed under direct vision. Throughout the                            procedure, the patient's blood pressure, pulse, and                            oxygen saturations were monitored continuously. The                            [id number]-[id number] ([id number]) Olympus endoscope was introduced                            through the and advanced to the. The upper GI                            endoscopy was accomplished without difficulty. The                            patient tolerated the procedure well. Scope In: Scope Out: Findings:      One benign-appearing, intrinsic moderate (circumferential scarring or       stenosis; an endoscope may pass) stenosis was found at the       gastroesophageal junction. This stenosis measured 1.5 cm (inner       diameter). The stenosis was traversed. Small hiatal hernia below.      Diffuse severe inflammation characterized by congestion (edema),       erythema, friability and mucus was found in the entire examined stomach.       Biopsies were taken with a cold forceps for histology. Verification of       patient identification for the specimen was done. Estimated blood loss       was minimal.      Diffuse severe inflammation characterized by congestion (edema),       erythema, friability and mucus was found in the duodenal bulb and in the       second portion of the duodenum. Biopsies were taken with a cold forceps       for histology. Verification of patient identification for the specimen       was done. Estimated blood loss was minimal.      A medium non-bleeding diverticulum was found in the second portion  of       the duodenum.      The cardia and gastric fundus were otherwise normal on retroflexion. Impression:               - Benign-appearing esophageal stenosis.                           - Gastritis. Biopsied. No identifiable ulcer                           - Duodenitis. Biopsied.                           - Non-bleeding duodenal  diverticulum. I originally                            mistook this for a deep gastric ulcer - I think. I                            did see a lesion - deep in mucosa surrounded by                            edematous folds but think it was actually this                            duodenal diverticulum. Repeated gastric survey                            several times and could not clearly ID an ulcer                            There is so much edema and inflamed mucosa that                            landmarks are distorted. The mucosa is severely                            edematous and inflamed - ? related to malnutrition                            and low protein. ? Menetrier's disease - do not                            think this is portal htn but could be - certainly                            can be losing some blood from the gastrits Recommendation:           - Return patient to hospital ward for ongoing care.                           - Continue present medications.                           -  Start renal diet                           Get CT abd/pelvis with contrast re hx gastric ulcer                            (? still present - doubt) diffuse gastritis, weight                            loss ? malignancy - I ordered for tomorrow                           B12, ferritin, folate, thiamine - likely needs                            nutritional support ? nasoenteric tube vs                            aggressive oral supplementation - depending upon                            what goals of care are but I did ask dietitian to                            see Procedure Code(s):        --- Professional ---                           [phone], Esophagogastroduodenoscopy, flexible,                            transoral; with biopsy, single or multiple Diagnosis Code(s):        --- Professional ---                           K22.2, Esophageal obstruction                           K25.9, Gastric  ulcer, unspecified as acute or                            chronic, without hemorrhage or perforation                           K29.70, Gastritis, unspecified, without bleeding                           K29.80, Duodenitis without bleeding                           K25.7, Chronic gastric ulcer without hemorrhage or                            perforation  D64.9, Anemia, unspecified                           K57.10, Diverticulosis of small intestine without                            perforation or abscess without bleeding CPT copyright 2019 [employer]. All rights reserved. The codes documented in this report are preliminary and upon coder review may  be revised to meet current compliance requirements. Gatha Mayer, MD 05/21/2020 4:47:28 PM This report has been signed electronically. Number of Addenda: 0

## 2020-05-21 NOTE — Anesthesia Postprocedure Evaluation (Signed)
Anesthesia Post Note  Patient: Karen Morrow  Procedure(s) Performed: ESOPHAGOGASTRODUODENOSCOPY (EGD) WITH PROPOFOL (N/A ) BIOPSY     Patient location during evaluation: PACU Anesthesia Type: MAC Level of consciousness: awake and alert Pain management: pain level controlled Vital Signs Assessment: post-procedure vital signs reviewed and stable Respiratory status: spontaneous breathing Cardiovascular status: stable Anesthetic complications: no   No complications documented.  Last Vitals:  Vitals:   05/21/20 1645 05/21/20 1655  BP: 113/83 122/90  Pulse: (!) 111 (!) 108  Resp: 20 (!) 25  Temp:    SpO2: 96% 96%    Last Pain:  Vitals:   05/21/20 1655  TempSrc:   PainSc: 0-No pain                 Nolon Nations

## 2020-05-21 NOTE — Progress Notes (Signed)
Karyl Kinnier, RN informed that the patient's HD tx has been moved to 05/22/20.

## 2020-05-21 NOTE — Interval H&P Note (Signed)
History and Physical Interval Note:  05/21/2020 3:16 PM  Karen Morrow  has presented today for surgery, with the diagnosis of GI bleed with heme positive dark stools.  Acute on chronic anemia.  History of recurrent gastric ulcers, duodenitis on previous EGDs within the last 3 years.  The various methods of treatment have been discussed with the patient and family. After consideration of risks, benefits and other options for treatment, the patient has consented to  Procedure(s): ESOPHAGOGASTRODUODENOSCOPY (EGD) WITH PROPOFOL (N/A) as a surgical intervention.  The patient's history has been reviewed, patient examined, no change in status, stable for surgery.  I have reviewed the patient's chart and labs.  Questions were answered to the patient's satisfaction.     Silvano Rusk

## 2020-05-21 NOTE — Progress Notes (Signed)
Initial Nutrition Assessment  DOCUMENTATION CODES:   Underweight,Severe malnutrition in context of chronic illness  INTERVENTION:   -RD will follow for diet advancement and add supplements as appropriate -Pt may require nutrition support if this is within goals of care  NUTRITION DIAGNOSIS:   Severe Malnutrition related to chronic illness (ESRD on HD) as evidenced by severe fat depletion,severe muscle depletion.  GOAL:   Patient will meet greater than or equal to 90% of their needs  MONITOR:   Diet advancement,Labs,Weight trends,Skin,I & O's  REASON FOR ASSESSMENT:   Consult Assessment of nutrition requirement/status  ASSESSMENT:   Karen Morrow is a 59 y.o. female with medical history significant of ESRD, HTN, anemia of chronic disease, PAD on Plavix, hx of Upper gi bleed, chronic diastolic CHF, asthma, atrial fibrillation not on anticoagulation secondary to recurrent bleeding, GERD, hepatitis C antibody positive, aortic insufficiency, history of pulmonary hypertension due to left ventricular diastolic dysfunction, tobacco abuse  Pt admitted with GIB.   Reviewed I/O's: +266 ml x 24 hours and +2.5 L since admission  Rectal tube output: 1.4 L x 24 hours    Case discussed with RN; plan for EGD today. Pt currently NPO for procedure, but consumed clear liquids yesterday. RN shared that pt husband reports very poor appetite due to early satiety, often only consuming bites at meals.   Spoke with pt at bedside, who was sleepy, but answered close ended questions. She reports she has had a poor appetite over the past week. At baseline, she consumes 2 meals per day, which consists of a hamburger and french fries (RD questions accuracy of this).   Pt unsure of UBW or dry weight. She reports she started losing weight over the past two days. Reviewed wt hx; pt has experienced a 7% wt loss over the past 6 months, which is not significant for time frame. Per nephrology notes, EDW 42 kg.    Per discussion with RN, palliative care consult pending. Pt husband currently requesting full scope care.   Labs reviewed: CBGS: 68-70. K: 2.9.   NUTRITION - FOCUSED PHYSICAL EXAM:  Flowsheet Row Most Recent Value  Orbital Region Severe depletion  Upper Arm Region Severe depletion  Thoracic and Lumbar Region Severe depletion  Buccal Region Severe depletion  Temple Region Severe depletion  Clavicle Bone Region Severe depletion  Clavicle and Acromion Bone Region Severe depletion  Scapular Bone Region Severe depletion  Dorsal Hand Severe depletion  Patellar Region Severe depletion  Anterior Thigh Region Severe depletion  Posterior Calf Region Severe depletion  Edema (RD Assessment) None  Hair Reviewed  Eyes Reviewed  Mouth Reviewed  Skin Reviewed  Nails Reviewed       Diet Order:   Diet Order            Diet NPO time specified Except for: Sips with Meds  Diet effective midnight                 EDUCATION NEEDS:   No education needs have been identified at this time  Skin:  Skin Assessment: Reviewed RN Assessment  Last BM:  05/21/20 (600 ml via rectal tube)  Height:   Ht Readings from Last 1 Encounters:  05/19/20 5\' 7"  (1.702 m)    Weight:   Wt Readings from Last 1 Encounters:  05/21/20 45.2 kg   BMI:  Body mass index is 15.61 kg/m.  Estimated Nutritional Needs:   Kcal:  1800-2000  Protein:  90-105 grams  Fluid:  1000 ml +  UOP    Loistine Chance, RD, LDN, [place] Registered Dietitian II Certified Diabetes Care and Education Specialist Please refer to [facility] for RD and/or RD on-call/weekend/after hours pager

## 2020-05-21 NOTE — Progress Notes (Signed)
Patient with attending Md about EGD benefits and is now agreeable, Patient is 1st round of potassium infusing and she is did not tolerate PO potassium but got most of it down. Also continues to have watery stools collected in flexiseal bag.

## 2020-05-21 NOTE — Transfer of Care (Signed)
Immediate Anesthesia Transfer of Care Note  Patient: Karen Morrow  Procedure(s) Performed: ESOPHAGOGASTRODUODENOSCOPY (EGD) WITH PROPOFOL (N/A )  Patient Location: Endoscopy Unit  Anesthesia Type:MAC  Level of Consciousness: lethargic and responds to stimulation  Airway & Oxygen Therapy: Patient Spontanous Breathing and Patient connected to nasal cannula oxygen  Post-op Assessment: Report given to RN  Post vital signs: Reviewed and stable  Last Vitals:  Vitals Value Taken Time  BP    Temp    Pulse    Resp    SpO2      Last Pain:  Vitals:   05/21/20 1500  TempSrc:   PainSc: 0-No pain         Complications: No complications documented.

## 2020-05-21 NOTE — Plan of Care (Signed)
  Problem: Education: Goal: Knowledge of disease and its progression will improve Outcome: Progressing Goal: Individualized Educational Video(s) Outcome: Progressing   Problem: Fluid Volume: Goal: Compliance with measures to maintain balanced fluid volume will improve Outcome: Progressing   Problem: Health Behavior/Discharge Planning: Goal: Ability to manage health-related needs will improve Outcome: Progressing   Problem: Nutritional: Goal: Ability to make healthy dietary choices will improve Outcome: Progressing   Problem: Clinical Measurements: Goal: Complications related to the disease process, condition or treatment will be avoided or minimized Outcome: Progressing   Problem: Education: Goal: Knowledge of General Education information will improve Description: Including pain rating scale, medication(s)/side effects and non-pharmacologic comfort measures Outcome: Progressing   Problem: Health Behavior/Discharge Planning: Goal: Ability to manage health-related needs will improve Outcome: Progressing   Problem: Clinical Measurements: Goal: Ability to maintain clinical measurements within normal limits will improve Outcome: Progressing Goal: Will remain free from infection Outcome: Progressing Goal: Diagnostic test results will improve Outcome: Progressing Goal: Respiratory complications will improve Outcome: Progressing Goal: Cardiovascular complication will be avoided Outcome: Progressing   Problem: Activity: Goal: Risk for activity intolerance will decrease Outcome: Progressing   Problem: Nutrition: Goal: Adequate nutrition will be maintained Outcome: Progressing   Problem: Coping: Goal: Level of anxiety will decrease Outcome: Progressing   Problem: Elimination: Goal: Will not experience complications related to bowel motility Outcome: Progressing Goal: Will not experience complications related to urinary retention Outcome: Progressing   Problem: Pain  Managment: Goal: General experience of comfort will improve Outcome: Progressing   Problem: Safety: Goal: Ability to remain free from injury will improve Outcome: Progressing   Problem: Skin Integrity: Goal: Risk for impaired skin integrity will decrease Outcome: Progressing   

## 2020-05-22 DIAGNOSIS — E43 Unspecified severe protein-calorie malnutrition: Secondary | ICD-10-CM | POA: Insufficient documentation

## 2020-05-22 DIAGNOSIS — K2970 Gastritis, unspecified, without bleeding: Secondary | ICD-10-CM

## 2020-05-22 DIAGNOSIS — K2990 Gastroduodenitis, unspecified, without bleeding: Secondary | ICD-10-CM

## 2020-05-22 DIAGNOSIS — E876 Hypokalemia: Secondary | ICD-10-CM | POA: Diagnosis not present

## 2020-05-22 DIAGNOSIS — D649 Anemia, unspecified: Secondary | ICD-10-CM | POA: Diagnosis not present

## 2020-05-22 DIAGNOSIS — N186 End stage renal disease: Secondary | ICD-10-CM | POA: Diagnosis not present

## 2020-05-22 DIAGNOSIS — I5032 Chronic diastolic (congestive) heart failure: Secondary | ICD-10-CM | POA: Diagnosis not present

## 2020-05-22 DIAGNOSIS — K219 Gastro-esophageal reflux disease without esophagitis: Secondary | ICD-10-CM | POA: Diagnosis not present

## 2020-05-22 LAB — HEPATIC FUNCTION PANEL
ALT: 12 U/L (ref 0–44)
AST: 19 U/L (ref 15–41)
Albumin: 2 g/dL — ABNORMAL LOW (ref 3.5–5.0)
Alkaline Phosphatase: 61 U/L (ref 38–126)
Bilirubin, Direct: 0.4 mg/dL — ABNORMAL HIGH (ref 0.0–0.2)
Indirect Bilirubin: 0.8 mg/dL (ref 0.3–0.9)
Total Bilirubin: 1.2 mg/dL (ref 0.3–1.2)
Total Protein: 4.9 g/dL — ABNORMAL LOW (ref 6.5–8.1)

## 2020-05-22 LAB — CBC
HCT: 34.2 % — ABNORMAL LOW (ref 36.0–46.0)
Hemoglobin: 11.5 g/dL — ABNORMAL LOW (ref 12.0–15.0)
MCH: 27.8 pg (ref 26.0–34.0)
MCHC: 33.6 g/dL (ref 30.0–36.0)
MCV: 82.6 fL (ref 80.0–100.0)
Platelets: 59 10*3/uL — ABNORMAL LOW (ref 150–400)
RBC: 4.14 MIL/uL (ref 3.87–5.11)
RDW: 16.6 % — ABNORMAL HIGH (ref 11.5–15.5)
WBC: 7.8 10*3/uL (ref 4.0–10.5)
nRBC: 0 % (ref 0.0–0.2)

## 2020-05-22 LAB — GLUCOSE, CAPILLARY
Glucose-Capillary: 88 mg/dL (ref 70–99)
Glucose-Capillary: 89 mg/dL (ref 70–99)
Glucose-Capillary: 95 mg/dL (ref 70–99)
Glucose-Capillary: 94 mg/dL (ref 70–99)
Glucose-Capillary: 89 mg/dL (ref 70–99)
Glucose-Capillary: 77 mg/dL (ref 70–99)
Glucose-Capillary: 99 mg/dL (ref 70–99)
Glucose-Capillary: 103 mg/dL — ABNORMAL HIGH (ref 70–99)

## 2020-05-22 LAB — BASIC METABOLIC PANEL
Anion gap: 13 (ref 5–15)
Anion gap: 9 (ref 5–15)
BUN: 12 mg/dL (ref 6–20)
BUN: <5 mg/dL — ABNORMAL LOW (ref 6–20)
CO2: 18 mmol/L — ABNORMAL LOW (ref 22–32)
CO2: 25 mmol/L (ref 22–32)
Calcium: 7.7 mg/dL — ABNORMAL LOW (ref 8.9–10.3)
Calcium: 7.1 mg/dL — ABNORMAL LOW (ref 8.9–10.3)
Chloride: 102 mmol/L (ref 98–111)
Chloride: 101 mmol/L (ref 98–111)
Creatinine, Ser: 5.45 mg/dL — ABNORMAL HIGH (ref 0.44–1.00)
Creatinine, Ser: 2.89 mg/dL — ABNORMAL HIGH (ref 0.44–1.00)
GFR, Estimated: 8 mL/min — ABNORMAL LOW (ref >60)
GFR, Estimated: 18 mL/min — ABNORMAL LOW (ref >60)
Glucose, Bld: 89 mg/dL (ref 70–99)
Glucose, Bld: 86 mg/dL (ref 70–99)
Potassium: 2.8 mmol/L — ABNORMAL LOW (ref 3.5–5.1)
Potassium: 3.1 mmol/L — ABNORMAL LOW (ref 3.5–5.1)
Sodium: 133 mmol/L — ABNORMAL LOW (ref 135–145)
Sodium: 135 mmol/L (ref 135–145)

## 2020-05-22 LAB — RENAL FUNCTION PANEL
Albumin: 1.8 g/dL — ABNORMAL LOW (ref 3.5–5.0)
Anion gap: 12 (ref 5–15)
BUN: 13 mg/dL (ref 6–20)
CO2: 19 mmol/L — ABNORMAL LOW (ref 22–32)
Calcium: 7.6 mg/dL — ABNORMAL LOW (ref 8.9–10.3)
Chloride: 102 mmol/L (ref 98–111)
Creatinine, Ser: 5.58 mg/dL — ABNORMAL HIGH (ref 0.44–1.00)
GFR, Estimated: 8 mL/min — ABNORMAL LOW (ref >60)
Glucose, Bld: 98 mg/dL (ref 70–99)
Phosphorus: 2.5 mg/dL (ref 2.5–4.6)
Potassium: 2.4 mmol/L — CL (ref 3.5–5.1)
Sodium: 133 mmol/L — ABNORMAL LOW (ref 135–145)

## 2020-05-22 LAB — HEMOGLOBIN AND HEMATOCRIT, BLOOD
HCT: 36.5 % (ref 36.0–46.0)
Hemoglobin: 12.2 g/dL (ref 12.0–15.0)

## 2020-05-22 LAB — VITAMIN B12: Vitamin B-12: 750 pg/mL (ref 180–914)

## 2020-05-22 LAB — FERRITIN: Ferritin: 402 ng/mL — ABNORMAL HIGH (ref 11–307)

## 2020-05-22 LAB — FOLATE: Folate: 3.5 ng/mL — ABNORMAL LOW

## 2020-05-22 MED ORDER — DOXERCALCIFEROL 4 MCG/2ML IV SOLN
INTRAVENOUS | Status: AC
  Filled 2020-05-22: qty 2

## 2020-05-22 MED ORDER — PENTAFLUOROPROP-TETRAFLUOROETH EX AERO: 1.0000 "application " | INHALATION_SPRAY | CUTANEOUS | Status: DC | PRN

## 2020-05-22 MED ORDER — SODIUM CHLORIDE 0.9 % IV BOLUS
500.0000 mL | Freq: Once | INTRAVENOUS | Status: AC
  Administered 2020-05-22: 500 mL via INTRAVENOUS

## 2020-05-22 MED ORDER — AMIODARONE IV BOLUS ONLY 150 MG/100ML
150.0000 mg | Freq: Once | INTRAVENOUS | Status: AC
  Administered 2020-05-22: 150 mg via INTRAVENOUS
  Filled 2020-05-22: qty 100

## 2020-05-22 MED ORDER — DEXTROSE 5 % IV SOLN: INTRAVENOUS | Status: DC

## 2020-05-22 MED ORDER — LIDOCAINE-PRILOCAINE 2.5-2.5 % EX CREA: 1.0000 "application " | TOPICAL_CREAM | CUTANEOUS | Status: DC | PRN

## 2020-05-22 MED ORDER — LIDOCAINE-PRILOCAINE 2.5-2.5 % EX CREA
1.0000 | TOPICAL_CREAM | CUTANEOUS | Status: DC | PRN
Start: 2020-05-22 — End: 2020-05-22

## 2020-05-22 MED ORDER — LOPERAMIDE HCL 2 MG PO CAPS
2.0000 mg | ORAL_CAPSULE | ORAL | Status: DC | PRN
  Administered 2020-05-22: 2 mg via ORAL
  Filled 2020-05-22: qty 1

## 2020-05-22 MED ORDER — ENSURE ENLIVE PO LIQD
237.0000 mL | Freq: Three times a day (TID) | ORAL | Status: DC
  Administered 2020-05-22 – 2020-05-23 (×4): 237 mL via ORAL
  Filled 2020-05-22 (×2): qty 237

## 2020-05-22 MED ORDER — LIDOCAINE HCL (PF) 1 % IJ SOLN: 5.0000 mL | INTRAMUSCULAR | Status: DC | PRN

## 2020-05-22 MED ORDER — ALTEPLASE 2 MG IJ SOLR: 2.0000 mg | Freq: Once | INTRAMUSCULAR | Status: DC | PRN

## 2020-05-22 MED ORDER — RENA-VITE PO TABS
1.0000 | ORAL_TABLET | Freq: Every day | ORAL | Status: DC
  Administered 2020-05-22: 1 via ORAL
  Filled 2020-05-22 (×2): qty 1

## 2020-05-22 MED ORDER — SODIUM CHLORIDE 0.9 % IV SOLN: 100.0000 mL | INTRAVENOUS | Status: DC | PRN

## 2020-05-22 MED ORDER — HEPARIN SODIUM (PORCINE) 1000 UNIT/ML DIALYSIS: 1000.0000 [IU] | INTRAMUSCULAR | Status: DC | PRN

## 2020-05-22 MED ORDER — SODIUM CHLORIDE 0.9 % IV SOLN
100.0000 mL | INTRAVENOUS | Status: DC | PRN
Start: 2020-05-22 — End: 2020-05-22

## 2020-05-22 MED ORDER — FOLIC ACID 5 MG/ML IJ SOLN
1.0000 mg | Freq: Every day | INTRAMUSCULAR | Status: DC
  Administered 2020-05-23 – 2020-05-25 (×3): 1 mg via INTRAVENOUS
  Filled 2020-05-22 (×7): qty 0.2

## 2020-05-22 MED ORDER — SODIUM CHLORIDE 0.9 % IV BOLUS
1000.0000 mL | Freq: Once | INTRAVENOUS | Status: AC
  Administered 2020-05-22: 1000 mL via INTRAVENOUS

## 2020-05-22 NOTE — Progress Notes (Signed)
Examined patient at bedside. I spoke with PCCM about this patient 2/2 her hypotension, and Afib RVR, in the setting of ESRD and chronic CHF. I had already given a 561ml bolus. PCCM rec's another 1 liter. Order placed.

## 2020-05-22 NOTE — Progress Notes (Addendum)
Nutrition Follow-up  DOCUMENTATION CODES:   Underweight,Severe malnutrition in context of chronic illness  INTERVENTION:   -Liberalize diet to regular; case discussed with MD -Ensure Enlive po BID, each supplement provides 350 kcal and 20 grams of protein -Magic cup TID with meals, each supplement provides 290 kcal and 9 grams of protein -Renal MVI daily -Strongly consider initiation of enteral nutrition support (long term vs short term, ex cortrak):  Initiate Osmolite 1.5 @ 20 ml/hr and increase by 10 ml every 8 hours to goal rate of 45 ml/hr.   45 ml Prosource TF BID.    Tube feeding regimen provides 1880 kcal (100% of needs), 86 grams of protein, and 912 ml of H2O.   -If enteral nutrition support is initiated, monitor K, Mg, and Phos daily and replete as needed, as pt is at high refeeding risk  NUTRITION DIAGNOSIS:   Severe Malnutrition related to chronic illness (ESRD on HD) as evidenced by severe fat depletion,severe muscle depletion.  Ongoing  GOAL:   Patient will meet greater than or equal to 90% of their needs  Progressing   MONITOR:   Diet advancement,Labs,Weight trends,Skin,I & O's  REASON FOR ASSESSMENT:   Consult Assessment of nutrition requirement/status  ASSESSMENT:   Karen Morrow is a 59 y.o. female with medical history significant of ESRD, HTN, anemia of chronic disease, PAD on Plavix, hx of Upper gi bleed, chronic diastolic CHF, asthma, atrial fibrillation not on anticoagulation secondary to recurrent bleeding, GERD, hepatitis C antibody positive, aortic insufficiency, history of pulmonary hypertension due to left ventricular diastolic dysfunction, tobacco abuse  12/22- s/p EGD- revealed benign- appearing esophageal stenosis, gastritis (biopsied), duodenitis (biopsied), non-bleeding duodenal diverticulum  Reviewed I/O's: +1.3 L x 24 hours and +3.8 L since admission  Rectal tube output: 300 ml x 24 hours  Case discussed with Dr. Wynelle Cleveland; pt is  currently on a renal diet. Received verbal permission to liberalize diet to regular. RD expressed concern over malnutrition and prolonged oral intake; suspect that pt will be unable to meet nutritional needs on PO diet alone. Per MD, she plans to discuss possible cortrak tube placement with pt and family later this afternoon.   Case discussed with RN, who reports pt remains with poor oral intake, however, rectal tube output has improved. RD discussed recommendations and orders with RN. She reports that palliative care plans to meet with pt and family later today to discuss goals of care. Per RN, pt husband desired full scope care when palliative met with him yesterday. Notified RN that cortrak service would be available tomorrow (05/23/20) if pt desires cortrak tube.   Labs reviewed: Na: 133, K: 2.4. CBGS: 89-95.   Diet Order:   Diet Order            Diet regular Room service appropriate? Yes; Fluid consistency: Thin  Diet effective now                 EDUCATION NEEDS:   No education needs have been identified at this time  Skin:  Skin Assessment: Reviewed RN Assessment  Last BM:  05/21/20 (600 ml via rectal tube)  Height:   Ht Readings from Last 1 Encounters:  05/19/20 _0  (1.702 m)    Weight:   Wt Readings from Last 1 Encounters:  05/22/20 43 kg   BMI:  Body mass index is 14.85 kg/m.  Estimated Nutritional Needs:   Kcal:  1800-2000  Protein:  90-105 grams  Fluid:  1000 ml + UOP  Loistine Chance, RD, LDN, [place] Registered Dietitian II Certified Diabetes Care and Education Specialist Please refer to [facility] for RD and/or RD on-call/weekend/after hours pager

## 2020-05-22 NOTE — Progress Notes (Signed)
   05/22/20 2102  Assess: MEWS Score  Temp 99 F (37.2 C)  BP (!) 82/61  Pulse Rate (!) 135  Resp (!) 25  Level of Consciousness Alert  SpO2 100 %  O2 Device Room Air  Assess: MEWS Score  MEWS Temp 0  MEWS Systolic 1  MEWS Pulse 3  MEWS RR 1  MEWS LOC 0  MEWS Score 5  MEWS Score Color Red  Assess: if the MEWS score is Yellow or Red  Were vital signs taken at a resting state? Yes  Focused Assessment Change from prior assessment (see assessment flowsheet)  Early Detection of Sepsis Score *See Row Information* Low  MEWS guidelines implemented *See Row Information* Yes  Treat  MEWS Interventions Escalated (See documentation below)  Pain Scale 0-10  Pain Score 0  Notify: Charge Nurse/RN  Name of Charge Nurse/RN Notified [place]  Date Charge Nurse/RN Notified 05/22/20  Time Charge Nurse/RN Notified 2102  Notify: Rapid Response  Name of Rapid Response RN Notified [place]  Date Rapid Response Notified 05/22/20  Time Rapid Response Notified 2102

## 2020-05-22 NOTE — Progress Notes (Signed)
Patient lunch came and she will not eat it she said she does not like it and can not chew meat.

## 2020-05-22 NOTE — Procedures (Signed)
Patient seen and examined on Hemodialysis. BP 107/83   Pulse 95   Temp (!) 97.5 F (36.4 C) (Oral)   Resp 18   Ht 5\' 7"  (1.702 m)   Wt 43 kg   SpO2 100%   BMI 14.85 kg/m   QB 400 mL/ min via AVG, UF goal even  Tolerating treatment without complaints at this time.   Madelon Lips MD [facility] pgr [phone] 1:18 PM

## 2020-05-22 NOTE — Progress Notes (Signed)
[facility] Progress Note   Subjective: Seen on HD, asking for a Sprite. Says she feels better, no further diarrhea reported. Afib on monitor.   Objective Vitals:   05/21/20 1655 05/21/20 1751 05/21/20 2032 05/22/20 0622  BP: 122/90 (!) 124/96 (!) 119/91 (!) 87/74  Pulse: (!) 108  92 75  Resp: (!) 25 20 18 18   Temp:   98 F (36.7 C) 97.6 F (36.4 C)  TempSrc:   Oral   SpO2: 96% 97% 100% 100%  Weight:    44.2 kg  Height:       Physical Exam General: Frail, chronically ill appearing female in NAD Heart: irreg, irreg. No M/R/G.  Lungs: CTAB decreased in bases Abdomen: S, NT Extremities: No LE edema.  Dialysis Access: AVG cannulated   Additional Objective Labs: Basic Metabolic Panel: Recent Labs  Lab 05/19/20 1748 05/20/20 0400 05/20/20 0408 05/21/20 0310 05/21/20 1248 05/22/20 0334  NA  --  140   < > 135 136 133*  K  --  2.4*   < > 2.3* 2.9* 2.8*  CL  --  103  --  103 104 102  CO2  --  18*  --  20* 21* 18*  GLUCOSE  --  66*  --  86 63* 89  BUN  --  36*  --  10 11 12   CREATININE  --  10.97*  --  4.66* 4.70* 5.45*  CALCIUM  --  8.3*  --  7.7* 7.4* 7.7*  PHOS 7.2* 6.5*  --  2.1*  --   --    < > = values in this interval not displayed.   Liver Function Tests: Recent Labs  Lab 05/19/20 1336 05/21/20 2029  AST 28 25  ALT 14 14  ALKPHOS 57 61  BILITOT 1.0 0.8  PROT 3.9* 4.8*  ALBUMIN 1.5* 1.9*   No results for input(s): LIPASE, AMYLASE in the last 168 hours. CBC: Recent Labs  Lab 05/19/20 1655 05/20/20 0400 05/20/20 0408 05/20/20 1644 05/21/20 0310  WBC 8.1 8.5  --   --  7.5  NEUTROABS 6.3  --   --   --   --   HGB 7.6* 11.6* 11.6* 11.8* 12.9  HCT 22.8* 35.3* 34.0* 34.4* 36.7  MCV 83.2 83.8  --   --  81.6  PLT 87* 100*  --   --  66*   Blood Culture    Component Value Date/Time   SDES BLOOD RIGHT HAND 09/16/2018 0252   SPECREQUEST  09/16/2018 0252    BOTTLES DRAWN AEROBIC ONLY Blood Culture adequate volume   CULT  09/16/2018  0252    NO GROWTH 5 DAYS Performed at [facility], [place] [address], [place], [place] [address]    REPTSTATUS 09/21/2018 FINAL 09/16/2018 0252    Cardiac Enzymes: Recent Labs  Lab 05/20/20 0545  CKTOTAL 207   CBG: Recent Labs  Lab 05/21/20 2223 05/22/20 0247 05/22/20 0419 05/22/20 0618 05/22/20 0744  GLUCAP 94 88 89 95 94   Iron Studies:  Recent Labs    05/22/20 0334  FERRITIN 402*   @[email or web address] @ Studies/Results: DG Chest Port 1 View  Result Date: 05/21/2020 CLINICAL DATA:  Shortness of breath EXAM: PORTABLE CHEST 1 VIEW COMPARISON:  May 19, 2020 FINDINGS: There is ill-defined airspace opacity in the left mid lung and left base regions and more subtly in the right lower lung region. No consolidation. There is left base atelectasis. The heart is upper normal in size with  pulmonary vascularity normal. No adenopathy. There is aortic atherosclerosis. No bone lesions. There is calcification in each carotid artery. IMPRESSION: Ill-defined airspace opacity in the left mid lung and left base regions and to a lesser degree in the right lower lung region. Appearance concerning for multifocal pneumonia. Advise correlation with COVID-19 status in this regard. There is also atelectasis in the left base. Stable cardiac silhouette.  No adenopathy. Aortic Atherosclerosis (ICD10-I70.0). Electronically Signed   By: Lowella Grip III M.D.   On: 05/21/2020 08:13   Medications: . dextrose 50 mL/hr at 05/21/20 1723   . Chlorhexidine Gluconate Cloth  6 each Topical Q0600  . cinacalcet  30 mg Oral Q supper  . doxercalciferol  2 mcg Intravenous Q T,Th,Sa-HD  . ferric citrate  420 mg Oral TID WC  . folic acid  1 mg Intravenous Daily  . mirtazapine  7.5 mg Oral QHS  . pantoprazole (PROTONIX) IV  40 mg Intravenous Q12H  . rosuvastatin  10 mg Oral Daily  . thiamine injection  100 mg Intravenous Daily     Dialysis Orders:[facility] T,Th,S 3:45 hr 180NRe 350/600 42 kg 3.0K/2.25 Ca  AVF -Mircera 50 mcg IV q 2 weeks -Hectorol 2 mcg IV q 2 weeks -Venofer 50 mg IV weekly  Assessment/Plan: 1. Upper GIB with ABLA-Per primary/GIB.EGD 12/22 gastritis, duodenitis, non-bleeding diverticulum.  HGB 7.6 on adm S/P 2 units PRBCs. Now 12.9.  2. C difficile- Ag positive but toxin negative.  GI pathogen panel negative too.  Per primary/ GI 3. Hypokalemia-being repleted. Use 4.0 K bath with HD.  4. Failure to thrive-PCM, continued weight loss. Hypoglycemia requiring D10w in ED. Greatly appreciate palliative care 5. ESRD - T,Th,S No HD for one week. HD overnight Monday, HD today on schedule. 6. Hypertension/volume- continue IVFs to keep up with GI losses 7. Anemia -See # 1. Follow HGB, transfuse as needed. 8. Metabolic bone disease - C Ca OK. PO4 elevated. Continue VDRA, binders when eating. 9. Nutrition - Clear liquids at present. Albumin 1.5. Regular diet, protein supp, renal vit. When able to eat.  10. H/O Hep C 11.  Dispo: Overall I'm very concerned that this represents a continued decline.  She has had significant weight loss in the past year, isn't taking her medications, and isn't eating.  She is also missing more and more dialysis.  I greatly appreciate palliative care. She remains full code.     Ell Tiso H. Shawanda Sievert NP-C 05/22/2020, 9:00 AM  [employer] [phone]

## 2020-05-22 NOTE — Progress Notes (Addendum)
PROGRESS NOTE    Karen Morrow   MIW:[id number]  DOB: Oct 03, 1960  DOA: 05/19/2020 PCP: [name], MD   Brief Narrative:  Karen Morrow is a 59 y.o. F with ESRD on HD TThS, Afib not on AC due to rec GIB, HTN, dCHF, and hep C, PAD on Plavix who presented with 1 week rectal bleeding for which she was using sanitary pads.   Over the last week, patient has been using pads because of leaking blood in stool.  In the ER was hypotensive to 89/64 and hypogylcemic to 50s and so was started on pressors and given glucose.  Hgb was 7.6 and she was given 2u PRBCs.   GI was consulted for the GI bleed.    Subjective: Nausea resolved.  Wanting to try to eat today. Per RN, she still has quite a lot of liquid stool output.     Assessment & Plan:   Active Problems: GI bleeding  - has h/o of PUD with a gastric ulcer noted on EGD in 09/2018, 3/21, 5/21 - per husband patient has not been taking her meds for months - EGD planned for today- on IV Protonix  - as of now, no longer has blood in stool  Hemorrhagic shock due to GI blood loss - resolved after 2 u PRBC - Hb 7.6 on admission and is now ~ 12.9  Diarrhea-  -still has diarrhea today (per RN) - patient still not aware she is having diarrhea - follow I and O as closely as possible - C diff antigen + but toxin neg- not treating as C diff at this time  Hypoglycemia - likely due to poor oral intake- CBG was 58 on 12/22 and D10 was started over night - follow oral intake  FTT - per renal notes, the patient has had significant weight loss-  - not sure how much the diarrhea is playing a part in this - f/u GI work up to see if there is something reversible - palliative care discussions being had - patient wants to hold off on NG tube today and is going to try to eat  Hypokalemia  - likely due to diarrhea and poor oral intake - replacing again today - follow closely     Acute Thrombocytopenia  - Plt have been < 100 since  admission- lat noted to be 193 on 09/2019 (in Epic)- no prior history of low platelets - ? If due to acute blood loss - not receiving any heparin products  ESRD - appreciate f/u by renal team- per notes, she has a h/o being non-adherent to dialysis and has missed a week of dialysis prior to this admission    Hepatic cirrhosis ? - noted on chart- fatty liver was noted on abdominal ultrasound on 07/2019- no mention of cirrhosis at this time  A-fib, permanent  - not on anticoagulation due to prior GI bleeding - Metoprolol on hold due to low BPs   Dementia?  - poor historian, not aware of even events pertaining to today, forgetting to take meds - follow    Time spent in minutes: 35 DVT prophylaxis: SCDs Start: 05/20/20 0116 Place and maintain sequential compression device Start: 05/19/20 2226  Code Status: Full code Family Communication:  Disposition Plan:  Status is: Inpatient  Remains inpatient appropriate because:IV treatments appropriate due to intensity of illness or inability to take PO   Dispo: The patient is from: Home  Anticipated d/c is to: TBD              Anticipated d/c date is: 2 days              Patient currently is not medically stable to d/c.   Consultants:   GI  Nephrology  Palliative care Procedures:   none Antimicrobials:  Anti-infectives (From admission, onward)   None       Objective: Vitals:   05/22/20 1245 05/22/20 1250 05/22/20 1300 05/22/20 1340  BP: 107/83  96/76 92/76  Pulse: 95   96  Resp: 19 18 19 19   Temp:    (!) 97.3 F (36.3 C)  TempSrc:    Oral  SpO2: 100%   100%  Weight:      Height:        Intake/Output Summary (Last 24 hours) at 05/22/2020 1418 Last data filed at 05/22/2020 1242 Gross per 24 hour  Intake 1150 ml  Output 0 ml  Net 1150 ml   Filed Weights   05/22/20 0622 05/22/20 0840 05/22/20 1242  Weight: 44.2 kg 43.1 kg 43 kg    Examination: General exam: Appears uncomfortable  HEENT:  PERRLA, oral mucosa moist, no sclera icterus or thrush Respiratory system: Clear to auscultation. Respiratory effort normal. Cardiovascular system: S1 & S2 heard, RRR.   Gastrointestinal system: Abdomen soft, non-tender, nondistended. Normal bowel sounds. Central nervous system: Alert and oriented. No focal neurological deficits. Extremities: No cyanosis, clubbing or edema Skin: No rashes or ulcers Psychiatry:  Flat affect    Data Reviewed: I have personally reviewed following labs and imaging studies  CBC: Recent Labs  Lab 05/19/20 1655 05/20/20 0400 05/20/20 0408 05/20/20 1644 05/21/20 0310 05/22/20 0903  WBC 8.1 8.5  --   --  7.5 7.8  NEUTROABS 6.3  --   --   --   --   --   HGB 7.6* 11.6* 11.6* 11.8* 12.9 11.5*  HCT 22.8* 35.3* 34.0* 34.4* 36.7 34.2*  MCV 83.2 83.8  --   --  81.6 82.6  PLT 87* 100*  --   --  66* 59*   Basic Metabolic Panel: Recent Labs  Lab 05/19/20 1748 05/20/20 0400 05/20/20 0408 05/21/20 0310 05/21/20 1248 05/22/20 0334 05/22/20 0909  NA  --  140 140 135 136 133* 133*  K  --  2.4* 2.0* 2.3* 2.9* 2.8* 2.4*  CL  --  103  --  103 104 102 102  CO2  --  18*  --  20* 21* 18* 19*  GLUCOSE  --  66*  --  86 63* 89 98  BUN  --  36*  --  10 11 12 13   CREATININE  --  10.97*  --  4.66* 4.70* 5.45* 5.58*  CALCIUM  --  8.3*  --  7.7* 7.4* 7.7* 7.6*  MG 1.9 1.8  --  1.7  --   --   --   PHOS 7.2* 6.5*  --  2.1*  --   --  2.5   GFR: Estimated Creatinine Clearance: 7.3 mL/min (A) (by C-G formula based on SCr of 5.58 mg/dL (H)). Liver Function Tests: Recent Labs  Lab 05/19/20 1336 05/21/20 2029 05/22/20 0909  AST 28 25  --   ALT 14 14  --   ALKPHOS 57 61  --   BILITOT 1.0 0.8  --   PROT 3.9* 4.8*  --   ALBUMIN 1.5* 1.9* 1.8*   No results for input(s): LIPASE, AMYLASE  in the last 168 hours. Recent Labs  Lab 05/20/20 0545  AMMONIA 45*   Coagulation Profile: Recent Labs  Lab 05/19/20 1336  INR 1.7*   Cardiac Enzymes: Recent Labs  Lab  05/20/20 0545  CKTOTAL 207   BNP (last 3 results) No results for input(s): PROBNP in the last 8760 hours. HbA1C: No results for input(s): HGBA1C in the last 72 hours. CBG: Recent Labs  Lab 05/22/20 0419 05/22/20 0618 05/22/20 0744 05/22/20 1120 05/22/20 1325  GLUCAP 89 95 94 89 77   Lipid Profile: No results for input(s): CHOL, HDL, LDLCALC, TRIG, CHOLHDL, LDLDIRECT in the last 72 hours. Thyroid Function Tests: No results for input(s): TSH, T4TOTAL, FREET4, T3FREE, THYROIDAB in the last 72 hours. Anemia Panel: Recent Labs    05/22/20 0334  VITAMINB12 750  FOLATE 3.5*  FERRITIN 402*   Urine analysis:    Component Value Date/Time   COLORURINE YELLOW 11/26/2013 1819   APPEARANCEUR TURBID (A) 11/26/2013 1819   LABSPEC 1.021 11/26/2013 1819   PHURINE 5.5 11/26/2013 1819   GLUCOSEU NEGATIVE 11/26/2013 1819   HGBUR SMALL (A) 11/26/2013 1819   BILIRUBINUR NEGATIVE 11/26/2013 1819   KETONESUR NEGATIVE 11/26/2013 1819   PROTEINUR >300 (A) 11/26/2013 1819   UROBILINOGEN 0.2 11/26/2013 1819   NITRITE POSITIVE (A) 11/26/2013 1819   LEUKOCYTESUR MODERATE (A) 11/26/2013 1819   Sepsis Labs: @LABRCNTIP (procalcitonin:4,lacticidven:4) ) Recent Results (from the past 240 hour(s))  Resp Panel by RT-PCR (Flu A&B, Covid) Nasopharyngeal Swab     Status: None   Collection Time: 05/19/20 10:57 AM   Specimen: Nasopharyngeal Swab; Nasopharyngeal(NP) swabs in vial transport medium  Result Value Ref Range Status   SARS Coronavirus 2 by RT PCR NEGATIVE NEGATIVE Final    Comment: (NOTE) SARS-CoV-2 target nucleic acids are NOT DETECTED.  The SARS-CoV-2 RNA is generally detectable in upper respiratory specimens during the acute phase of infection. The lowest concentration of SARS-CoV-2 viral copies this assay can detect is 138 copies/mL. A negative result does not preclude SARS-Cov-2 infection and should not be used as the sole basis for treatment or other patient management decisions. A  negative result may occur with  improper specimen collection/handling, submission of specimen other than nasopharyngeal swab, presence of viral mutation(s) within the areas targeted by this assay, and inadequate number of viral copies(<138 copies/mL). A negative result must be combined with clinical observations, patient history, and epidemiological information. The expected result is Negative.  Fact Sheet for Patients:  EntrepreneurPulse.com.au  Fact Sheet for Healthcare Providers:  IncredibleEmployment.be  This test is no t yet approved or cleared by the [place] FDA and  has been authorized for detection and/or diagnosis of SARS-CoV-2 by FDA under an Emergency Use Authorization (EUA). This EUA will remain  in effect (meaning this test can be used) for the duration of the COVID-19 declaration under Section 564(b)(1) of the Act, 21 U.S.C.section 360bbb-3(b)(1), unless the authorization is terminated  or revoked sooner.       Influenza A by PCR NEGATIVE NEGATIVE Final   Influenza B by PCR NEGATIVE NEGATIVE Final    Comment: (NOTE) The Xpert Xpress SARS-CoV-2/FLU/RSV plus assay is intended as an aid in the diagnosis of influenza from Nasopharyngeal swab specimens and should not be used as a sole basis for treatment. Nasal washings and aspirates are unacceptable for Xpert Xpress SARS-CoV-2/FLU/RSV testing.  Fact Sheet for Patients: EntrepreneurPulse.com.au  Fact Sheet for Healthcare Providers: IncredibleEmployment.be  This test is not yet approved or cleared by the [place] FDA  and has been authorized for detection and/or diagnosis of SARS-CoV-2 by FDA under an Emergency Use Authorization (EUA). This EUA will remain in effect (meaning this test can be used) for the duration of the COVID-19 declaration under Section 564(b)(1) of the Act, 21 U.S.C. section 360bbb-3(b)(1), unless the authorization  is terminated or revoked.  Performed at [facility], [place] [address] [place], [place] [address]   C Difficile Quick Screen w PCR reflex     Status: Abnormal   Collection Time: 05/19/20  5:47 PM   Specimen: STOOL  Result Value Ref Range Status   C Diff antigen POSITIVE (A) NEGATIVE Final   C Diff toxin NEGATIVE NEGATIVE Final   C Diff interpretation Results are indeterminate. See PCR results.  Final    Comment: Performed at [facility], [place] [address], [place], [place] [address]  Gastrointestinal Panel by PCR , Stool     Status: None   Collection Time: 05/19/20  5:47 PM   Specimen: STOOL  Result Value Ref Range Status   Campylobacter species NOT DETECTED NOT DETECTED Final   Plesimonas shigelloides NOT DETECTED NOT DETECTED Final   Salmonella species NOT DETECTED NOT DETECTED Final   Yersinia enterocolitica NOT DETECTED NOT DETECTED Final   Vibrio species NOT DETECTED NOT DETECTED Final   Vibrio cholerae NOT DETECTED NOT DETECTED Final   Enteroaggregative E coli (EAEC) NOT DETECTED NOT DETECTED Final   Enteropathogenic E coli (EPEC) NOT DETECTED NOT DETECTED Final   Enterotoxigenic E coli (ETEC) NOT DETECTED NOT DETECTED Final   Shiga like toxin producing E coli (STEC) NOT DETECTED NOT DETECTED Final   Shigella/Enteroinvasive E coli (EIEC) NOT DETECTED NOT DETECTED Final   Cryptosporidium NOT DETECTED NOT DETECTED Final   Cyclospora cayetanensis NOT DETECTED NOT DETECTED Final   Entamoeba histolytica NOT DETECTED NOT DETECTED Final   Giardia lamblia NOT DETECTED NOT DETECTED Final   Adenovirus F40/41 NOT DETECTED NOT DETECTED Final   Astrovirus NOT DETECTED NOT DETECTED Final   Norovirus GI/GII NOT DETECTED NOT DETECTED Final   Rotavirus A NOT DETECTED NOT DETECTED Final   Sapovirus (I, II, IV, and V) NOT DETECTED NOT DETECTED Final    Comment: Performed at [facility], [address] [place], [place] [address]  C. Diff by PCR, Reflexed     Status: None    Collection Time: 05/19/20  5:47 PM  Result Value Ref Range Status   Toxigenic C. Difficile by PCR NEGATIVE NEGATIVE Final    Comment: Patient is colonized with non toxigenic C. difficile. May not need treatment unless significant symptoms are present. Performed at [facility], [place] [address] [place], [place] [address]          Radiology Studies: DG Chest Port 1 View  Result Date: 05/21/2020 CLINICAL DATA:  Shortness of breath EXAM: PORTABLE CHEST 1 VIEW COMPARISON:  May 19, 2020 FINDINGS: There is ill-defined airspace opacity in the left mid lung and left base regions and more subtly in the right lower lung region. No consolidation. There is left base atelectasis. The heart is upper normal in size with pulmonary vascularity normal. No adenopathy. There is aortic atherosclerosis. No bone lesions. There is calcification in each carotid artery. IMPRESSION: Ill-defined airspace opacity in the left mid lung and left base regions and to a lesser degree in the right lower lung region. Appearance concerning for multifocal pneumonia. Advise correlation with COVID-19 status in this regard. There is also atelectasis in the left  base. Stable cardiac silhouette.  No adenopathy. Aortic Atherosclerosis (ICD10-I70.0). Electronically Signed   By: Lowella Grip III M.D.   On: 05/21/2020 08:13      Scheduled Meds: . Chlorhexidine Gluconate Cloth  6 each Topical Q0600  . cinacalcet  30 mg Oral Q supper  . doxercalciferol  2 mcg Intravenous Q T,Th,Sa-HD  . feeding supplement  237 mL Oral TID BM  . ferric citrate  420 mg Oral TID WC  . folic acid  1 mg Intravenous Daily  . mirtazapine  7.5 mg Oral QHS  . multivitamin  1 tablet Oral QHS  . pantoprazole (PROTONIX) IV  40 mg Intravenous Q12H  . rosuvastatin  10 mg Oral Daily  . thiamine injection  100 mg Intravenous Daily   Continuous Infusions: . dextrose 50 mL/hr at 05/21/20 1723     LOS: 2 days      Debbe Odea, MD [facility] Pager: [email or web address] 05/22/2020, 2:18 PM

## 2020-05-22 NOTE — Progress Notes (Signed)
Daily Progress Note   Patient Name: Karen Morrow       Date: 05/22/2020 DOB: 10-26-1960  Age: 59 y.o. MRN#: [id number] Attending Physician: Debbe Odea, MD Primary Care Physician: [name], MD Admit Date: 05/19/2020  Reason for Consultation/Follow-up: Establishing goals of care  Subjective: I met at bedside with patient and husband to discuss [place]. Patient is more alert and interactive compared to yesterday. States she feels "better".   Reviewed patient's PMH,  hospital course, and current medical condition. Briefly discussed the results of the EGD. Discussed that her hemoglobin was 11.5 this morning.   We discussed her current illness and what it means in the larger context of her ongoing co-morbidities.  Husband was not aware she had liver disease (hepatitis) or heart failure. Natural disease trajectory of chronic illness and frailty was discussed.  I attempted to elicit values and goals of care important to the patient. She is very withdrawn and will only answer direct questions. When I asked if her goal was to get better, she answers "of course". We discuss that her current nutritional and functional status do not seem to align with this goal.    The difference between aggressive medical intervention and comfort care was considered in light of the patient's goals of care. Husband is interested in all aggressive medical interventions offered. We did discuss code status. Encouraged patient to consider DNR/DNI status understanding evidenced based poor outcomes in similar hospitalized patients, as the cause of the arrest is likely associated with chronic/terminal disease rather than a reversible acute cardio-pulmonary event. Husband becomes tearful during this discussion and is not  yet ready to make that decision. He requests time for them to discuss this issue further between themselves.   Hospice and Palliative Care services outpatient were explained and offered.  Questions and concerns were addressed.  The family was encouraged to call with questions or concerns.    Length of Stay: 2  Current Medications: Scheduled Meds:  . Chlorhexidine Gluconate Cloth  6 each Topical Q0600  . cinacalcet  30 mg Oral Q supper  . doxercalciferol  2 mcg Intravenous Q T,Th,Sa-HD  . feeding supplement  237 mL Oral TID BM  . ferric citrate  420 mg Oral TID WC  . folic acid  1 mg Intravenous Daily  . mirtazapine  7.5 mg Oral QHS  . multivitamin  1 tablet Oral QHS  . pantoprazole (PROTONIX) IV  40 mg Intravenous Q12H  . rosuvastatin  10 mg Oral Daily  . thiamine injection  100 mg Intravenous Daily      PRN Meds: loperamide, ondansetron (ZOFRAN) IV  Physical Exam Vitals reviewed.  Constitutional:      General: She is not in acute distress.    Appearance: She is cachectic. She is ill-appearing.  Cardiovascular:     Rate and Rhythm: Tachycardia present.  Pulmonary:     Effort: Pulmonary effort is normal.  Neurological:     Mental Status: She is alert.     Motor: Weakness present.  Psychiatric:        Mood and Affect: Affect is flat.             Vital Signs: BP 92/76 (BP Location: Right Arm)   Pulse 96   Temp (!) 97.3 F (36.3 C) (Oral)   Resp 19   Ht '5\' 7"'  (1.702 m)   Wt 43 kg   SpO2 100%   BMI 14.85 kg/m  SpO2: SpO2: 100 % O2 Device: O2 Device: Room Air O2 Flow Rate:    Intake/output summary:   Intake/Output Summary (Last 24 hours) at 05/22/2020 1629 Last data filed at 05/22/2020 1512 Gross per 24 hour  Intake 0 ml  Output 0 ml  Net 0 ml   LBM: Last BM Date: 05/22/20 Baseline Weight: Weight: 71.7 kg Most recent weight: Weight: 43 kg       Palliative Assessment/Data: PPS 20-30%        Palliative Care Assessment & Plan   HPI/Patient  Profile: 59 y.o. female  with past medical history of ESRD (dialysis on TTS), hypertension, anemia of chronic disease, chronic diastolic CHF, A-fib not on anticoagulation secondary to recurrent bleeding, GERD, PAD, hepatitis C antibody positive, aortic insufficiency, and pulmonary HTN due to left ventricular diastolic dysfunction. She presented to the emergency department on 05/19/2020 with GI bleeding x 1 week. Reported soaking multiple sanitary pads with bloody stool. Her last dialysis session was 12/11 ED Course: Patient is hypotensive and tachycardic. BUN 31, creatinine 9.69, potassium 2.4, glucose 53, albumin 1.5, hemoglobin 7.6, FOBT positive. Chest x-ray negative. C diff toxin negative but c diff antigen positive. She was started on protonix and octreotide infusions. Admitted to [facility] for further management of GI bleeding. Palliative care has been consulted to assist with goals of care.   Assessment: - GI bleeding; EGD showed gastritis but no ulcer - history of PUD - ESRD on dialysis - anemia - diarrhea - failure to thrive - severe malnutrition  Recommendations/Plan: - full scope full code - continue current medical treatment  - consider increasing mirtazapine for appetite and sleep - PMT will continue to follow  Goals of Care and Additional Recommendations:  Limitations on Scope of Treatment: Full Scope Treatment  Code Status: Full code  Prognosis:   poor overall  Discharge Planning:  To Be Determined   Thank you for allowing the Palliative Medicine Team to assist in the care of this patient.   Total Time 35 minutes Prolonged Time Billed  no       Greater than 50%  of this time was spent counseling and coordinating care related to the above assessment and plan.  Lavena Bullion, NP  Please contact Palliative Medicine Team phone at [phone] for questions and concerns.

## 2020-05-22 NOTE — Progress Notes (Signed)
Called into pt room due to HR sustained >130. Pt does not appear to be in any distress, but is slightly confused. MD paged waiting for response.

## 2020-05-22 NOTE — Progress Notes (Signed)
Progress Note   Subjective  Patient in HD. States she has no pain. Has no appetite, not eating well. Does tolerating PO but just doesn't want to eat. EGD done yesterday per Dr. Carlean Purl.   Objective   Vital signs in last 24 hours: Temp:  [97.6 F (36.4 C)-98.7 F (37.1 C)] 97.6 F (36.4 C) (12/23 0622) Pulse Rate:  [75-113] 75 (12/23 0622) Resp:  [8-25] 18 (12/23 0622) BP: (87-124)/(65-96) 87/74 (12/23 0622) SpO2:  [94 %-100 %] 100 % (12/23 0622) Weight:  [44.2 kg] 44.2 kg (12/23 0622) Last BM Date: 05/21/20 General:    AA female in NAD Neurologic:  Alert and oriented,  grossly normal neurologically. Psych:  Cooperative. Normal mood and affect.  Intake/Output from previous day: 12/22 0701 - 12/23 0700 In: 1609.7 [P.O.:60; I.V.:1150; IV Piggyback:399.7] Out: 300 [Stool:300] Intake/Output this shift: No intake/output data recorded.  Lab Results: Recent Labs    05/20/20 0400 05/20/20 0408 05/20/20 1644 05/21/20 0310 05/22/20 0903  WBC 8.5  --   --  7.5 7.8  HGB 11.6*   < > 11.8* 12.9 11.5*  HCT 35.3*   < > 34.4* 36.7 34.2*  PLT 100*  --   --  66* 59*   < > = values in this interval not displayed.   BMET Recent Labs    05/21/20 0310 05/21/20 1248 05/22/20 0334  NA 135 136 133*  K 2.3* 2.9* 2.8*  CL 103 104 102  CO2 20* 21* 18*  GLUCOSE 86 63* 89  BUN 10 11 12   CREATININE 4.66* 4.70* 5.45*  CALCIUM 7.7* 7.4* 7.7*   LFT Recent Labs    05/21/20 2029  PROT 4.8*  ALBUMIN 1.9*  AST 25  ALT 14  ALKPHOS 61  BILITOT 0.8  BILIDIR 0.4*  IBILI 0.4   PT/INR Recent Labs    05/19/20 1336  LABPROT 19.6*  INR 1.7*    Studies/Results: DG Chest Port 1 View  Result Date: 05/21/2020 CLINICAL DATA:  Shortness of breath EXAM: PORTABLE CHEST 1 VIEW COMPARISON:  May 19, 2020 FINDINGS: There is ill-defined airspace opacity in the left mid lung and left base regions and more subtly in the right lower lung region. No consolidation. There is left base  atelectasis. The heart is upper normal in size with pulmonary vascularity normal. No adenopathy. There is aortic atherosclerosis. No bone lesions. There is calcification in each carotid artery. IMPRESSION: Ill-defined airspace opacity in the left mid lung and left base regions and to a lesser degree in the right lower lung region. Appearance concerning for multifocal pneumonia. Advise correlation with COVID-19 status in this regard. There is also atelectasis in the left base. Stable cardiac silhouette.  No adenopathy. Aortic Atherosclerosis (ICD10-I70.0). Electronically Signed   By: Lowella Grip III M.D.   On: 05/21/2020 08:13       Assessment / Plan:    59 y/o female ESRD on HD, hep C, history of recurrent gastric ulcer, admitted with recurrent bleeding over a few days. She has not been compliant with PPIs, she states she generally does not take her protonix. She denies NSAID use.   EGD per Dr. Carlean Purl yesterday showed severe inflammation / congestion in the stomach and duodenum - no overt ulcer obvious, suspected diverticulum in the duodenum. Unclear what is driving this process. Biopsies taken. CT scan ordered and pending. Her nutritional status remains quite poor, she is quite frail, not eating. Palliative care consult noted, they are reassessing the patient /  family today to discuss goals of care. If she is refusing to increase her PO intake, consider [place] but would await discussion with family first in regards to how aggressive they wish to be with her care at this point. Continue IV PPI otherwise, no recurrent bleeding noted. Will await path results from EGD and CT scan results. Call with questions.  Ionia Cellar, MD [facility]

## 2020-05-22 NOTE — Plan of Care (Signed)
  Problem: Pain Managment: Goal: General experience of comfort will improve Outcome: Progressing   Problem: Safety: Goal: Ability to remain free from injury will improve Outcome: Progressing   Problem: Education: Goal: Knowledge of disease and its progression will improve Outcome: Not Progressing Goal: Individualized Educational Video(s) Outcome: Not Progressing   Problem: Fluid Volume: Goal: Compliance with measures to maintain balanced fluid volume will improve Outcome: Not Progressing   Problem: Health Behavior/Discharge Planning: Goal: Ability to manage health-related needs will improve Outcome: Not Progressing   Problem: Nutritional: Goal: Ability to make healthy dietary choices will improve Outcome: Not Progressing   Problem: Clinical Measurements: Goal: Complications related to the disease process, condition or treatment will be avoided or minimized Outcome: Not Progressing   Problem: Education: Goal: Knowledge of General Education information will improve Description: Including pain rating scale, medication(s)/side effects and non-pharmacologic comfort measures Outcome: Not Progressing   Problem: Health Behavior/Discharge Planning: Goal: Ability to manage health-related needs will improve Outcome: Not Progressing   Problem: Clinical Measurements: Goal: Ability to maintain clinical measurements within normal limits will improve Outcome: Not Progressing Goal: Will remain free from infection Outcome: Not Progressing Goal: Diagnostic test results will improve Outcome: Not Progressing Goal: Respiratory complications will improve Outcome: Not Progressing Goal: Cardiovascular complication will be avoided Outcome: Not Progressing   Problem: Activity: Goal: Risk for activity intolerance will decrease Outcome: Not Progressing   Problem: Nutrition: Goal: Adequate nutrition will be maintained Outcome: Not Progressing   Problem: Coping: Goal: Level of anxiety  will decrease Outcome: Not Progressing   Problem: Elimination: Goal: Will not experience complications related to bowel motility Outcome: Not Progressing Goal: Will not experience complications related to urinary retention Outcome: Not Progressing   Problem: Skin Integrity: Goal: Risk for impaired skin integrity will decrease Outcome: Not Progressing

## 2020-05-23 ENCOUNTER — Inpatient Hospital Stay (HOSPITAL_COMMUNITY): Payer: Medicare Other

## 2020-05-23 DIAGNOSIS — E876 Hypokalemia: Secondary | ICD-10-CM | POA: Diagnosis not present

## 2020-05-23 DIAGNOSIS — N186 End stage renal disease: Secondary | ICD-10-CM | POA: Diagnosis not present

## 2020-05-23 DIAGNOSIS — K219 Gastro-esophageal reflux disease without esophagitis: Secondary | ICD-10-CM | POA: Diagnosis not present

## 2020-05-23 DIAGNOSIS — I5032 Chronic diastolic (congestive) heart failure: Secondary | ICD-10-CM | POA: Diagnosis not present

## 2020-05-23 LAB — CBC
HCT: 36.2 % (ref 36.0–46.0)
Hemoglobin: 12.8 g/dL (ref 12.0–15.0)
MCH: 29 pg (ref 26.0–34.0)
MCHC: 35.4 g/dL (ref 30.0–36.0)
MCV: 82.1 fL (ref 80.0–100.0)
Platelets: 58 10*3/uL — ABNORMAL LOW (ref 150–400)
RBC: 4.41 MIL/uL (ref 3.87–5.11)
RDW: 17.4 % — ABNORMAL HIGH (ref 11.5–15.5)
WBC: 8.6 10*3/uL (ref 4.0–10.5)
nRBC: 0.2 % (ref 0.0–0.2)

## 2020-05-23 LAB — GLUCOSE, CAPILLARY
Glucose-Capillary: 134 mg/dL — ABNORMAL HIGH (ref 70–99)
Glucose-Capillary: 70 mg/dL (ref 70–99)
Glucose-Capillary: 79 mg/dL (ref 70–99)
Glucose-Capillary: 89 mg/dL (ref 70–99)
Glucose-Capillary: 89 mg/dL (ref 70–99)
Glucose-Capillary: 97 mg/dL (ref 70–99)

## 2020-05-23 LAB — BASIC METABOLIC PANEL
Anion gap: 11 (ref 5–15)
Anion gap: 10 (ref 5–15)
Anion gap: 10 (ref 5–15)
BUN: 5 mg/dL — ABNORMAL LOW (ref 6–20)
BUN: 5 mg/dL — ABNORMAL LOW (ref 6–20)
BUN: 7 mg/dL (ref 6–20)
CO2: 22 mmol/L (ref 22–32)
CO2: 22 mmol/L (ref 22–32)
CO2: 22 mmol/L (ref 22–32)
Calcium: 7.5 mg/dL — ABNORMAL LOW (ref 8.9–10.3)
Calcium: 7.7 mg/dL — ABNORMAL LOW (ref 8.9–10.3)
Calcium: 7.9 mg/dL — ABNORMAL LOW (ref 8.9–10.3)
Chloride: 102 mmol/L (ref 98–111)
Chloride: 101 mmol/L (ref 98–111)
Chloride: 97 mmol/L — ABNORMAL LOW (ref 98–111)
Creatinine, Ser: 3.07 mg/dL — ABNORMAL HIGH (ref 0.44–1.00)
Creatinine, Ser: 3.43 mg/dL — ABNORMAL HIGH (ref 0.44–1.00)
Creatinine, Ser: 3.69 mg/dL — ABNORMAL HIGH (ref 0.44–1.00)
GFR, Estimated: 17 mL/min — ABNORMAL LOW (ref >60)
GFR, Estimated: 15 mL/min — ABNORMAL LOW (ref >60)
GFR, Estimated: 13 mL/min — ABNORMAL LOW (ref >60)
Glucose, Bld: 112 mg/dL — ABNORMAL HIGH (ref 70–99)
Glucose, Bld: 93 mg/dL (ref 70–99)
Glucose, Bld: 114 mg/dL — ABNORMAL HIGH (ref 70–99)
Potassium: 2.7 mmol/L — CL (ref 3.5–5.1)
Potassium: 2.6 mmol/L — CL (ref 3.5–5.1)
Potassium: 3.6 mmol/L (ref 3.5–5.1)
Sodium: 135 mmol/L (ref 135–145)
Sodium: 133 mmol/L — ABNORMAL LOW (ref 135–145)
Sodium: 129 mmol/L — ABNORMAL LOW (ref 135–145)

## 2020-05-23 LAB — MAGNESIUM
Magnesium: 1.5 mg/dL — ABNORMAL LOW (ref 1.7–2.4)
Magnesium: 1.5 mg/dL — ABNORMAL LOW (ref 1.7–2.4)

## 2020-05-23 MED ORDER — VITAL HIGH PROTEIN PO LIQD: 1000.0000 mL | ORAL | Status: DC

## 2020-05-23 MED ORDER — DIGOXIN 0.25 MG/ML IJ SOLN
0.2500 mg | Freq: Once | INTRAMUSCULAR | Status: AC
  Administered 2020-05-23: 0.25 mg via INTRAVENOUS
  Filled 2020-05-23: qty 2

## 2020-05-23 MED ORDER — DIPHENOXYLATE-ATROPINE 2.5-0.025 MG/5ML PO LIQD
5.0000 mL | Freq: Four times a day (QID) | ORAL | Status: DC
  Administered 2020-05-23 (×2): 5 mL via ORAL
  Filled 2020-05-23 (×2): qty 5

## 2020-05-23 MED ORDER — RENA-VITE PO TABS
1.0000 | ORAL_TABLET | Freq: Every day | ORAL | Status: DC
  Administered 2020-05-23 – 2020-05-27 (×4): 1
  Filled 2020-05-23 (×4): qty 1

## 2020-05-23 MED ORDER — SODIUM CHLORIDE 0.9 % IV BOLUS: 500.0000 mL | Freq: Once | INTRAVENOUS | Status: DC

## 2020-05-23 MED ORDER — PROSOURCE TF PO LIQD
45.0000 mL | Freq: Two times a day (BID) | ORAL | Status: DC
  Administered 2020-05-23 – 2020-05-27 (×10): 45 mL
  Filled 2020-05-23 (×11): qty 45

## 2020-05-23 MED ORDER — METOPROLOL TARTRATE 12.5 MG HALF TABLET
12.5000 mg | ORAL_TABLET | Freq: Two times a day (BID) | ORAL | Status: DC
  Administered 2020-05-23 (×2): 12.5 mg via ORAL
  Filled 2020-05-23 (×2): qty 1

## 2020-05-23 MED ORDER — FERRIC CITRATE 1 GM 210 MG(FE) PO TABS
210.0000 mg | ORAL_TABLET | Freq: Three times a day (TID) | ORAL | Status: DC
  Administered 2020-05-23: 210 mg via ORAL
  Filled 2020-05-23 (×2): qty 1

## 2020-05-23 MED ORDER — IOHEXOL 300 MG/ML  SOLN
100.0000 mL | Freq: Once | INTRAMUSCULAR | Status: AC | PRN
  Administered 2020-05-23: 100 mL via INTRAVENOUS

## 2020-05-23 MED ORDER — ALBUMIN HUMAN 25 % IV SOLN
50.0000 g | Freq: Once | INTRAVENOUS | Status: AC
  Administered 2020-05-23: 50 g via INTRAVENOUS
  Filled 2020-05-23: qty 200

## 2020-05-23 MED ORDER — POTASSIUM CHLORIDE 10 MEQ/100ML IV SOLN
INTRAVENOUS | Status: AC
  Filled 2020-05-23: qty 100

## 2020-05-23 MED ORDER — DIGOXIN 0.1 MG/ML IJ SOLN
0.2500 mg | Freq: Once | INTRAMUSCULAR | Status: DC
  Filled 2020-05-23: qty 3

## 2020-05-23 MED ORDER — POTASSIUM CHLORIDE 10 MEQ/100ML IV SOLN
10.0000 meq | INTRAVENOUS | Status: AC
  Administered 2020-05-23 (×5): 10 meq via INTRAVENOUS
  Filled 2020-05-23 (×2): qty 100

## 2020-05-23 MED ORDER — OSMOLITE 1.5 CAL PO LIQD
1000.0000 mL | ORAL | Status: DC
  Administered 2020-05-23 – 2020-05-25 (×2): 1000 mL
  Filled 2020-05-23 (×7): qty 1000

## 2020-05-23 MED ORDER — LORAZEPAM 2 MG/ML IJ SOLN
0.5000 mg | Freq: Once | INTRAMUSCULAR | Status: AC
  Administered 2020-05-23: 0.5 mg via INTRAVENOUS
  Filled 2020-05-23: qty 1

## 2020-05-23 NOTE — Progress Notes (Signed)
Karen NOTE    [name]   IOX:[id number]  DOB: 01/19/1961  DOA: 05/19/2020 PCP: [name], MD   Brief Narrative:  Karen Morrow is a 59 y.o. F with ESRD on HD TThS, Afib not on AC due to rec GIB, HTN, dCHF, and hep C, PAD on Plavix who presented with 1 week rectal bleeding for which she was using sanitary pads.   Over the last week, patient has been using pads because of "leaking blood in stool".  In the ER was hypotensive to 89/64 and hypogylcemic to 50s and so was started on pressors and given glucose.  Hgb was 7.6 and she was given 2u PRBCs.   GI was consulted for the GI bleed.   Subjective:  She is still not eating much despite stating that she does want to eat. I have had a discussion with her and her husband about an NG tube and further interventions including a PEG, d/c to SNF and possibly permanently requiring SNF if she continues to decline. We have decided that the feeding tube is to be used temporarily to boost her nutrition while I further investigate a cause for her ongoing diarrhea. The patient's husband does state that although he is OK with a temporary feeding tube, he would be less likely to pursue a permanent feeding tube. The patient does not seem to understand the complex nature of her situation but does repeated state that she is willing to have a feeding tube.     Assessment & Plan:   Active Problems: GI bleeding ? - of note, no blood in stool actually noted in the hospital- per ED doctor, patient had "black green stool in her diaper"- still was possibly this color due to being on Ferric Citrate.  - she has h/o of PUD with a gastric ulcer noted on EGD in 09/2018, 3/21, 5/21 - per husband patient has not been taking her meds for months - s/p EGD, no ulcer found but she does have gastritis - as of now, no longer has blood in stool but has ongoing diarrhea which the patient's husband states was present prior to the bloody stools- she has no  complaints of abdominal pain or nausea    Watery Diarrhea-  -despite essentially being NPO due to poor oral intake, she still has diarrhea and is requiring a rectal tube- patient still not aware she is having diarrhea and thinks the rectal tube is something that is being used for dialysis - follow I and O as closely as possible - C diff antigen + but toxin neg-- not treating as C diff at this time- per GI, ok to use anti motility agents  - will continue to further w/u diarrhea with a CT of the abdomen/pelvis today  FTT - the patient has had significant weight loss and now has significant weakness  - I feel that the diarrhea is playing a large part in this and has to do with her loss of appetite - f/u GI work up to see if there is something reversible - palliative care discussions being had - will be placing a feeding tube today while we work on finding reversible causes  Anemia - ? due to GI blood loss vs due to anemia of chronic disease - resolved after 2 u PRBC - Hb 7.6 on admission and is now ~ 12.9  Hypoglycemia - likely due to poor oral intake- CBG was 58 on 12/22 and D10 was started over night-I stopped this  but D5 was ordered again over night-  I don't feel that we need to give a dialysis patient continuous IVF especially for phycological hypoglycemia- I have stopped fluids once again and we will stop CBGs - follow oral intake  A-fib, permanent  - not on anticoagulation due to prior GI bleeding - Metoprolol on hold due to low BPs - developed RVR overnight - given IVF boluses and IV anAmio bolus- will resume Metoprolol cautiously and watch BP closely- can give 1 dose of IV dig to see if it helps  Hypokalemia  - likely due to diarrhea and poor oral intake - replacing again today - follow closely     Acute Thrombocytopenia  - Plt have been < 100 since admission- last noted to be 193 on 09/2019 (in [place])- no prior history of low platelets - ? If due to acute blood loss - not  receiving any heparin products  ESRD - appreciate f/u by renal team- per notes, she has a h/o being non-adherent to dialysis and has missed a week of dialysis prior to this admission    Hepatic cirrhosis ? - noted on chart- fatty liver was noted on abdominal ultrasound on 07/2019 but not mentioned on prior CT report - no mention of cirrhosis at this time    Dementia?  - poor historian, forgetting to take meds at home, forgetful of recent events - follow    Time spent in minutes: 35 DVT prophylaxis: SCDs Start: 05/20/20 0116 Place and maintain sequential compression device Start: 05/19/20 2226  Code Status: Full code Family Communication:  Disposition Plan:  Status is: Inpatient  Remains inpatient appropriate because:IV treatments appropriate due to intensity of illness or inability to take PO   Dispo: The patient is from: Home              Anticipated d/c is to: TBD              Anticipated d/c date is: 2 days              Patient currently is not medically stable to d/c.   Consultants:   GI  Nephrology  Palliative care Procedures:   EGD Antimicrobials:  Anti-infectives (From admission, onward)   None       Objective: Vitals:   05/22/20 2200 05/22/20 2300 05/23/20 0000 05/23/20 0100  BP: (!) 86/74 (!) 78/56 (!) 86/70 (!) 85/69  Pulse: (!) 57     Resp: (!) 25 (!) 24 (!) 28 16  Temp:      TempSrc:      SpO2: 99%     Weight:      Height:        Intake/Output Summary (Last 24 hours) at 05/23/2020 1205 Last data filed at 05/23/2020 0800 Gross per 24 hour  Intake 627.49 ml  Output 800 ml  Net -172.51 ml   Filed Weights   05/22/20 0622 05/22/20 0840 05/22/20 1242  Weight: 44.2 kg 43.1 kg 43 kg    Examination: General exam: Appears comfortable  HEENT: PERRLA, oral mucosa moist, no sclera icterus or thrush Respiratory system: Clear to auscultation. Respiratory effort normal. Cardiovascular system: S1 & S2 heard,  No murmurs  Gastrointestinal  system: Abdomen soft, non-tender, mildly distended today- Normal bowel sounds  Central nervous system: Alert and oriented. No focal neurological deficits. Extremities: No cyanosis, clubbing or edema Skin: No rashes or ulcers Psychiatry:  flat affect    Data Reviewed: I have personally reviewed following labs and imaging studies  CBC: Recent Labs  Lab 05/19/20 1655 05/20/20 0400 05/20/20 0408 05/20/20 1644 05/21/20 0310 05/22/20 0903 05/22/20 2118 05/23/20 0105  WBC 8.1 8.5  --   --  7.5 7.8  --  8.6  NEUTROABS 6.3  --   --   --   --   --   --   --   HGB 7.6* 11.6*   < > 11.8* 12.9 11.5* 12.2 12.8  HCT 22.8* 35.3*   < > 34.4* 36.7 34.2* 36.5 36.2  MCV 83.2 83.8  --   --  81.6 82.6  --  82.1  PLT 87* 100*  --   --  66* 59*  --  58*   < > = values in this interval not displayed.   Basic Metabolic Panel: Recent Labs  Lab 05/19/20 1748 05/20/20 0400 05/20/20 0408 05/21/20 0310 05/21/20 1248 05/22/20 0334 05/22/20 0909 05/22/20 1543 05/23/20 0105  NA  --  140   < > 135 136 133* 133* 135 135  K  --  2.4*   < > 2.3* 2.9* 2.8* 2.4* 3.1* 2.7*  CL  --  103  --  103 104 102 102 101 102  CO2  --  18*  --  20* 21* 18* 19* 25 22  GLUCOSE  --  66*  --  86 63* 89 98 86 112*  BUN  --  36*  --  10 11 12 13  <5* 5*  CREATININE  --  10.97*  --  4.66* 4.70* 5.45* 5.58* 2.89* 3.07*  CALCIUM  --  8.3*  --  7.7* 7.4* 7.7* 7.6* 7.1* 7.5*  MG 1.9 1.8  --  1.7  --   --   --   --   --   PHOS 7.2* 6.5*  --  2.1*  --   --  2.5  --   --    < > = values in this interval not displayed.   GFR: Estimated Creatinine Clearance: 13.2 mL/min (A) (by C-G formula based on SCr of 3.07 mg/dL (H)). Liver Function Tests: Recent Labs  Lab 05/19/20 1336 05/21/20 2029 05/22/20 0909 05/22/20 1543  AST 28 25  --  19  ALT 14 14  --  12  ALKPHOS 57 61  --  61  BILITOT 1.0 0.8  --  1.2  PROT 3.9* 4.8*  --  4.9*  ALBUMIN 1.5* 1.9* 1.8* 2.0*   No results for input(s): LIPASE, AMYLASE in the last 168  hours. Recent Labs  Lab 05/20/20 0545  AMMONIA 45*   Coagulation Profile: Recent Labs  Lab 05/19/20 1336  INR 1.7*   Cardiac Enzymes: Recent Labs  Lab 05/20/20 0545  CKTOTAL 207   BNP (last 3 results) No results for input(s): PROBNP in the last 8760 hours. HbA1C: No results for input(s): HGBA1C in the last 72 hours. CBG: Recent Labs  Lab 05/22/20 2152 05/23/20 0021 05/23/20 0210 05/23/20 0702 05/23/20 0936  GLUCAP 103* 134* 89 89 79   Lipid Profile: No results for input(s): CHOL, HDL, LDLCALC, TRIG, CHOLHDL, LDLDIRECT in the last 72 hours. Thyroid Function Tests: No results for input(s): TSH, T4TOTAL, FREET4, T3FREE, THYROIDAB in the last 72 hours. Anemia Panel: Recent Labs    05/22/20 0334  VITAMINB12 750  FOLATE 3.5*  FERRITIN 402*   Urine analysis:    Component Value Date/Time   COLORURINE YELLOW 11/26/2013 1819   APPEARANCEUR TURBID (A) 11/26/2013 1819   LABSPEC 1.021 11/26/2013 1819   PHURINE 5.5 11/26/2013  [place] 11/26/2013 1819   HGBUR SMALL (A) 11/26/2013 1819   BILIRUBINUR NEGATIVE 11/26/2013 1819   KETONESUR NEGATIVE 11/26/2013 1819   PROTEINUR >300 (A) 11/26/2013 1819   UROBILINOGEN 0.2 11/26/2013 1819   NITRITE POSITIVE (A) 11/26/2013 1819   LEUKOCYTESUR MODERATE (A) 11/26/2013 1819   Sepsis Labs: @LABRCNTIP (procalcitonin:4,lacticidven:4) ) Recent Results (from the past 240 hour(s))  Resp Panel by RT-PCR (Flu A&B, Covid) Nasopharyngeal Swab     Status: None   Collection Time: 05/19/20 10:57 AM   Specimen: Nasopharyngeal Swab; Nasopharyngeal(NP) swabs in vial transport medium  Result Value Ref Range Status   SARS Coronavirus 2 by RT PCR NEGATIVE NEGATIVE Final    Comment: (NOTE) SARS-CoV-2 target nucleic acids are NOT DETECTED.  The SARS-CoV-2 RNA is generally detectable in upper respiratory specimens during the acute phase of infection. The lowest concentration of SARS-CoV-2 viral copies this assay can detect  is 138 copies/mL. A negative result does not preclude SARS-Cov-2 infection and should not be used as the sole basis for treatment or other patient management decisions. A negative result may occur with  improper specimen collection/handling, submission of specimen other than nasopharyngeal swab, presence of viral mutation(s) within the areas targeted by this assay, and inadequate number of viral copies(<138 copies/mL). A negative result must be combined with clinical observations, patient history, and epidemiological information. The expected result is Negative.  Fact Sheet for Patients:  EntrepreneurPulse.com.au  Fact Sheet for Healthcare Providers:  IncredibleEmployment.be  This test is no t yet approved or cleared by the [place] FDA and  has been authorized for detection and/or diagnosis of SARS-CoV-2 by FDA under an Emergency Use Authorization (EUA). This EUA will remain  in effect (meaning this test can be used) for the duration of the COVID-19 declaration under Section 564(b)(1) of the Act, 21 U.S.C.section 360bbb-3(b)(1), unless the authorization is terminated  or revoked sooner.       Influenza A by PCR NEGATIVE NEGATIVE Final   Influenza B by PCR NEGATIVE NEGATIVE Final    Comment: (NOTE) The Xpert Xpress SARS-CoV-2/FLU/RSV plus assay is intended as an aid in the diagnosis of influenza from Nasopharyngeal swab specimens and should not be used as a sole basis for treatment. Nasal washings and aspirates are unacceptable for Xpert Xpress SARS-CoV-2/FLU/RSV testing.  Fact Sheet for Patients: EntrepreneurPulse.com.au  Fact Sheet for Healthcare Providers: IncredibleEmployment.be  This test is not yet approved or cleared by the [place] FDA and has been authorized for detection and/or diagnosis of SARS-CoV-2 by FDA under an Emergency Use Authorization (EUA). This EUA will remain in effect  (meaning this test can be used) for the duration of the COVID-19 declaration under Section 564(b)(1) of the Act, 21 U.S.C. section 360bbb-3(b)(1), unless the authorization is terminated or revoked.  Performed at [facility], [place] [address], [place], [place] [address]   C Difficile Quick Screen w PCR reflex     Status: Abnormal   Collection Time: 05/19/20  5:47 PM   Specimen: STOOL  Result Value Ref Range Status   C Diff antigen POSITIVE (A) NEGATIVE Final   C Diff toxin NEGATIVE NEGATIVE Final   C Diff interpretation Results are indeterminate. See PCR results.  Final    Comment: Performed at [facility], [place] [address] [place], [place] [address]  Gastrointestinal Panel by PCR , Stool     Status: None   Collection Time: 05/19/20  5:47 PM   Specimen: STOOL  Result Value Ref  Range Status   Campylobacter species NOT DETECTED NOT DETECTED Final   Plesimonas shigelloides NOT DETECTED NOT DETECTED Final   Salmonella species NOT DETECTED NOT DETECTED Final   Yersinia enterocolitica NOT DETECTED NOT DETECTED Final   Vibrio species NOT DETECTED NOT DETECTED Final   Vibrio cholerae NOT DETECTED NOT DETECTED Final   Enteroaggregative E coli (EAEC) NOT DETECTED NOT DETECTED Final   Enteropathogenic E coli (EPEC) NOT DETECTED NOT DETECTED Final   Enterotoxigenic E coli (ETEC) NOT DETECTED NOT DETECTED Final   Shiga like toxin producing E coli (STEC) NOT DETECTED NOT DETECTED Final   Shigella/Enteroinvasive E coli (EIEC) NOT DETECTED NOT DETECTED Final   Cryptosporidium NOT DETECTED NOT DETECTED Final   Cyclospora cayetanensis NOT DETECTED NOT DETECTED Final   Entamoeba histolytica NOT DETECTED NOT DETECTED Final   Giardia lamblia NOT DETECTED NOT DETECTED Final   Adenovirus F40/41 NOT DETECTED NOT DETECTED Final   Astrovirus NOT DETECTED NOT DETECTED Final   Norovirus GI/GII NOT DETECTED NOT DETECTED Final   Rotavirus A NOT DETECTED NOT DETECTED Final   Sapovirus (I, II, IV,  and V) NOT DETECTED NOT DETECTED Final    Comment: Performed at [facility], [place] [address] [place], [place] [address]  C. Diff by PCR, Reflexed     Status: None   Collection Time: 05/19/20  5:47 PM  Result Value Ref Range Status   Toxigenic C. Difficile by PCR NEGATIVE NEGATIVE Final    Comment: Patient is colonized with non toxigenic C. difficile. May not need treatment unless significant symptoms are present. Performed at [facility], [place] [address], [place], [place] [address]          Radiology Studies: No results found.    Scheduled Meds: . Chlorhexidine Gluconate Cloth  6 each Topical Q0600  . cinacalcet  30 mg Oral Q supper  . diphenoxylate-atropine  5 mL Oral QID  . doxercalciferol  2 mcg Intravenous Q T,Th,Sa-HD  . feeding supplement  237 mL Oral TID BM  . ferric citrate  210 mg Oral TID WC  . folic acid  1 mg Intravenous Daily  . mirtazapine  7.5 mg Oral QHS  . multivitamin  1 tablet Oral QHS  . pantoprazole (PROTONIX) IV  40 mg Intravenous Q12H  . rosuvastatin  10 mg Oral Daily  . thiamine injection  100 mg Intravenous Daily   Continuous Infusions: . potassium chloride    . sodium chloride Stopped (05/23/20 0537)     LOS: 3 days      Debbe Odea, MD [facility] Pager: [email or web address] 05/23/2020, 12:05 PM

## 2020-05-23 NOTE — Progress Notes (Signed)
Patient ID: Karen Morrow, female   DOB: 10-19-1960, 59 y.o.   MRN: [id number]  [facility] Progress Note   Assessment/ Plan:   1.  Recurrent GI bleed with acute blood loss anemia: She has previous history of documented gastric ulcers and has not been adherent with PPI/antacids at home.  EGD done on 12/22 showed gastritis/duodenitis without obvious ulcer.  Unfortunately, nutritional status remains poor and this likely exacerbates her GI issues.  Current hemoglobin and hematocrit within acceptable range. 2. ESRD: She is usually on a TTS dialysis schedule and unfortunately did not have dialysis for about a week leading up to her hospitalization.  Underwent last hemodialysis yesterday with next treatment due Sunday (holiday schedule). 3.  Hypokalemia: Atypical in patient with end-stage renal disease, likely from total body depletion.  Continue supplementation and recheck labs in the evening. 4. CKD-MBD: Corrected calcium level within goal, phosphorus level within acceptable/low range.  Will decrease current dose of Auryxia.  Continue Sensipar and Hectorol for PTH suppression. 5.  Protein calorie malnutrition/failure to thrive: With significant reduction of appetite/weight loss with ongoing GI issues; continue efforts at optimizing nutrition with ongoing supplements and appreciate input from palliative care service. 6.  Atrial fibrillation with rapid ventricular response: She is relatively hypotensive and concern raised with changes in mentation-replace potassium and give albumin bolus.  Subjective:   Poorly interactive-tracks me around the room but does not offer verbal responses.   Objective:   BP (!) 85/69   Pulse (!) 57   Temp 99 F (37.2 C) (Oral)   Resp 16   Ht 5\' 7"  (1.702 m)   Wt 43 kg   SpO2 99%   BMI 14.85 kg/m   Physical Exam: Gen: Appears lethargic, resting in bed.  No verbal responses. CVS: Pulse irregularly irregular tachycardia, S1 and S2 without murmur Resp:  Clear to auscultation, no distinct rales or rhonchi Abd: Soft, scaphoid, nontender Ext: No lower extremity edema.  Dressings over left upper arm AVF.  Labs: BMET Recent Labs  Lab 05/19/20 1748 05/20/20 0400 05/20/20 0408 05/21/20 0310 05/21/20 1248 05/22/20 0334 05/22/20 0909 05/22/20 1543 05/23/20 0105  NA  --  140 140 135 136 133* 133* 135 135  K  --  2.4* 2.0* 2.3* 2.9* 2.8* 2.4* 3.1* 2.7*  CL  --  103  --  103 104 102 102 101 102  CO2  --  18*  --  20* 21* 18* 19* 25 22  GLUCOSE  --  66*  --  86 63* 89 98 86 112*  BUN  --  36*  --  10 11 12 13  <5* 5*  CREATININE  --  10.97*  --  4.66* 4.70* 5.45* 5.58* 2.89* 3.07*  CALCIUM  --  8.3*  --  7.7* 7.4* 7.7* 7.6* 7.1* 7.5*  PHOS 7.2* 6.5*  --  2.1*  --   --  2.5  --   --    CBC Recent Labs  Lab 05/19/20 1655 05/20/20 0400 05/20/20 0408 05/21/20 0310 05/22/20 0903 05/22/20 2118 05/23/20 0105  WBC 8.1 8.5  --  7.5 7.8  --  8.6  NEUTROABS 6.3  --   --   --   --   --   --   HGB 7.6* 11.6*   < > 12.9 11.5* 12.2 12.8  HCT 22.8* 35.3*   < > 36.7 34.2* 36.5 36.2  MCV 83.2 83.8  --  81.6 82.6  --  82.1  PLT 87* 100*  --  66* 59*  --  58*   < > = values in this interval not displayed.     Medications:    . Chlorhexidine Gluconate Cloth  6 each Topical Q0600  . cinacalcet  30 mg Oral Q supper  . doxercalciferol  2 mcg Intravenous Q T,Th,Sa-HD  . feeding supplement  237 mL Oral TID BM  . ferric citrate  420 mg Oral TID WC  . folic acid  1 mg Intravenous Daily  . mirtazapine  7.5 mg Oral QHS  . multivitamin  1 tablet Oral QHS  . pantoprazole (PROTONIX) IV  40 mg Intravenous Q12H  . rosuvastatin  10 mg Oral Daily  . thiamine injection  100 mg Intravenous Daily   Elmarie Shiley, MD 05/23/2020, 9:13 AM

## 2020-05-23 NOTE — Progress Notes (Signed)
Pt bp have been consistently low this shift. Mews have been red/yellow this shfit. Charge nurse, MD and rapid RN aware. Pt is confused and pulling out/off lines.   Last bolus held due to improving bp.

## 2020-05-23 NOTE — Procedures (Signed)
Cortrak  Person Inserting Tube:  Maylon Peppers C, RD Tube Type:  Cortrak - 43 inches Tube Location:  Left nare Initial Placement:  Stomach Secured by: Bridle Technique Used to Measure Tube Placement:  Documented cm marking at nare/ corner of mouth Cortrak Secured At:  64 cm    Cortrak Tube Team Note:  Consult received to place a Cortrak feeding tube.   No x-ray is required. RN may begin using tube.   If the tube becomes dislodged please keep the tube and contact the Cortrak team at [email or web address] (password [email or web address]) for replacement.  If after hours and replacement cannot be delayed, place a NG tube and confirm placement with an abdominal x-ray.    Lockie Pares., RD, LDN, CNSC See AMiON for contact information

## 2020-05-23 NOTE — Progress Notes (Addendum)
Nutrition Follow-up  DOCUMENTATION CODES:   Underweight,Severe malnutrition in context of chronic illness  INTERVENTION:   -Continue Ensure Enlive po BID, each supplement provides 350 kcal and 20 grams of protein -Continue renal MVI daily -Continue Magic cup TID with meals, each supplement provides 290 kcal and 9 grams of protein Initiate Osmolite 1.5 @ 20 ml/hr via cortrak tube and increase by 10 ml every 12 hours to goal rate of 50 ml/hr.   45 ml Prosource TF BID.    Tube feeding regimen provides 1880 kcal (100% of needs), 86 grams of protein, and 912 ml of H2O.   -Monitor K, Mg, and Phos daily and replete as needed, as pt is at high refeeding risk  NUTRITION DIAGNOSIS:   Severe Malnutrition related to chronic illness (ESRD on HD) as evidenced by severe fat depletion,severe muscle depletion.  Ongoing  GOAL:   Patient will meet greater than or equal to 90% of their needs  Progressing   MONITOR:   Diet advancement,Labs,Weight trends,Skin,I & O's  REASON FOR ASSESSMENT:   Consult Assessment of nutrition requirement/status  ASSESSMENT:   Karen Morrow is a 59 y.o. female with medical history significant of ESRD, HTN, anemia of chronic disease, PAD on Plavix, hx of Upper gi bleed, chronic diastolic CHF, asthma, atrial fibrillation not on anticoagulation secondary to recurrent bleeding, GERD, hepatitis C antibody positive, aortic insufficiency, history of pulmonary hypertension due to left ventricular diastolic dysfunction, tobacco abuse  12/22- s/p EGD- revealed benign- appearing esophageal stenosis, gastritis (biopsied), duodenitis (biopsied), non-bleeding duodenal diverticulum 12/24- cortrak placed (tip of tube on stomach), TF initiated  Reviewed I/O's: +628 ml x 24 hours and +4.5 L since admission  Pt very lethargic at time of visit. No family at bedside.   Case discussed with RN, who reports pt remains with rectal tube output and ongoing low K levels (being  repleted). Pt more lethargic today; she is unable to take her medications PO. RN reports goals of care discussions are ongoing, but family currently desires full scope care. Intake remains poor and pt agreed to cortrak today (per MD notes, pt refused yesterday).   Medications reviewed and include folic acid, remeron, thiamine, and KCl.   Labs reviewed: K: 2.7, CBGS: 79-134 (inpatient orders for glycemic control are none).   Diet Order:   Diet Order            Diet regular Room service appropriate? Yes; Fluid consistency: Thin  Diet effective now                 EDUCATION NEEDS:   No education needs have been identified at this time  Skin:  Skin Assessment: Reviewed RN Assessment  Last BM:  05/21/20 (600 ml via rectal tube)  Height:   Ht Readings from Last 1 Encounters:  05/19/20 5\' 7"  (1.702 m)    Weight:   Wt Readings from Last 1 Encounters:  05/22/20 43 kg   BMI:  Body mass index is 14.85 kg/m.  Estimated Nutritional Needs:   Kcal:  1800-2000  Protein:  90-105 grams  Fluid:  1000 ml + UOP    Loistine Chance, RD, LDN, [place] Registered Dietitian II Certified Diabetes Care and Education Specialist Please refer to [facility] for RD and/or RD on-call/weekend/after hours pager

## 2020-05-23 NOTE — Progress Notes (Signed)
      Progress Note   Subjective  Patient denies pain. Is in AF with RVR, monitor at bedside, BP stable. She seems confused, not able to clearly answer questions, asking for her husband. She denies any pain. No report of bleeding. Some loose stools yesterday, treated with immodium.    Objective   Vital signs in last 24 hours: Temp:  [97.3 F (36.3 C)-99 F (37.2 C)] 99 F (37.2 C) (12/23 2102) Pulse Rate:  [25-135] 57 (12/23 2200) Resp:  [16-36] 16 (12/24 0100) BP: (75-114)/(56-83) 85/69 (12/24 0100) SpO2:  [65 %-100 %] 99 % (12/23 2200) Weight:  [43 kg] 43 kg (12/23 1242) Last BM Date: 05/22/20 General:    AA female in NAD Heart:  AF with RVR - rates 120-130s Lungs: Respirations even and unlabored Abdomen:  Soft, nontender and nondistended  Intake/Output from previous day: 12/23 0701 - 12/24 0700 In: 627.5 [I.V.:627.5] Out: 0  Intake/Output this shift: Total I/O In: -  Out: 800 [Stool:800]  Lab Results: Recent Labs    05/21/20 0310 05/22/20 0903 05/22/20 2118 05/23/20 0105  WBC 7.5 7.8  --  8.6  HGB 12.9 11.5* 12.2 12.8  HCT 36.7 34.2* 36.5 36.2  PLT 66* 59*  --  58*   BMET Recent Labs    05/22/20 0909 05/22/20 1543 05/23/20 0105  NA 133* 135 135  K 2.4* 3.1* 2.7*  CL 102 101 102  CO2 19* 25 22  GLUCOSE 98 86 112*  BUN 13 <5* 5*  CREATININE 5.58* 2.89* 3.07*  CALCIUM 7.6* 7.1* 7.5*   LFT Recent Labs    05/22/20 1543  PROT 4.9*  ALBUMIN 2.0*  AST 19  ALT 12  ALKPHOS 61  BILITOT 1.2  BILIDIR 0.4*  IBILI 0.8   PT/INR No results for input(s): LABPROT, INR in the last 72 hours.  Studies/Results: No results found.     Assessment / Plan:    59 y/o female ESRD on HD, hep C, history of recurrent gastric ulcer, admitted with recurrent bleeding. She has not been compliant with PPIs / antacids at home. She denies NSAID use.   EGD done 12/22 - showed severe inflammation / congestion in the stomach and duodenum - no overt ulcer obvious,  suspected diverticulum in the duodenum. Biopsies taken. CT scan ordered and pending to further evaluate. Her nutritional status remains quite poor and could be driving gastric findings. Palliative care consult noted, they will reassess the patient / family today to discuss goals of care. If she is refusing to increase her PO intake, consider Dobhoff but would await discussion with family first in regards to how aggressive they wish to be with her care at this point. Continue IV PPI otherwise, no recurrent bleeding noted. Will await path results from EGD and CT scan results.   Now in AF with RVR, seems confused, to be addressed per primary service. Patient has had some loose stools but C Diff PCR negative, can use immodium PRN. WBC normal and Hgb normal at this time and reassuring.   Not much else to add at this point, would await path results and CT scan when she is stable enough to get it done. Will sign off for now and follow peripherally. Call with questions.  Potosi Cellar, MD [facility]

## 2020-05-24 DIAGNOSIS — I5032 Chronic diastolic (congestive) heart failure: Secondary | ICD-10-CM | POA: Diagnosis not present

## 2020-05-24 DIAGNOSIS — N186 End stage renal disease: Secondary | ICD-10-CM | POA: Diagnosis not present

## 2020-05-24 DIAGNOSIS — E876 Hypokalemia: Secondary | ICD-10-CM | POA: Diagnosis not present

## 2020-05-24 DIAGNOSIS — K219 Gastro-esophageal reflux disease without esophagitis: Secondary | ICD-10-CM | POA: Diagnosis not present

## 2020-05-24 LAB — GLUCOSE, CAPILLARY
Glucose-Capillary: 122 mg/dL — ABNORMAL HIGH (ref 70–99)
Glucose-Capillary: 113 mg/dL — ABNORMAL HIGH (ref 70–99)
Glucose-Capillary: 91 mg/dL (ref 70–99)
Glucose-Capillary: 83 mg/dL (ref 70–99)
Glucose-Capillary: 81 mg/dL (ref 70–99)
Glucose-Capillary: 82 mg/dL (ref 70–99)

## 2020-05-24 LAB — BASIC METABOLIC PANEL
Anion gap: 8 (ref 5–15)
BUN: 14 mg/dL (ref 6–20)
CO2: 19 mmol/L — ABNORMAL LOW (ref 22–32)
Calcium: 7.7 mg/dL — ABNORMAL LOW (ref 8.9–10.3)
Chloride: 101 mmol/L (ref 98–111)
Creatinine, Ser: 4.32 mg/dL — ABNORMAL HIGH (ref 0.44–1.00)
GFR, Estimated: 11 mL/min — ABNORMAL LOW (ref >60)
Glucose, Bld: 94 mg/dL (ref 70–99)
Potassium: 4.2 mmol/L (ref 3.5–5.1)
Sodium: 128 mmol/L — ABNORMAL LOW (ref 135–145)

## 2020-05-24 LAB — RENAL FUNCTION PANEL
Albumin: 2.6 g/dL — ABNORMAL LOW (ref 3.5–5.0)
Anion gap: 9 (ref 5–15)
BUN: 9 mg/dL (ref 6–20)
CO2: 21 mmol/L — ABNORMAL LOW (ref 22–32)
Calcium: 7.9 mg/dL — ABNORMAL LOW (ref 8.9–10.3)
Chloride: 97 mmol/L — ABNORMAL LOW (ref 98–111)
Creatinine, Ser: 3.86 mg/dL — ABNORMAL HIGH (ref 0.44–1.00)
GFR, Estimated: 13 mL/min — ABNORMAL LOW (ref >60)
Glucose, Bld: 129 mg/dL — ABNORMAL HIGH (ref 70–99)
Phosphorus: 1.5 mg/dL — ABNORMAL LOW (ref 2.5–4.6)
Potassium: 3.6 mmol/L (ref 3.5–5.1)
Sodium: 127 mmol/L — ABNORMAL LOW (ref 135–145)

## 2020-05-24 LAB — MAGNESIUM
Magnesium: 2.2 mg/dL (ref 1.7–2.4)
Magnesium: 1.9 mg/dL (ref 1.7–2.4)

## 2020-05-24 MED ORDER — ACETAMINOPHEN 160 MG/5ML PO SOLN
650.0000 mg | Freq: Four times a day (QID) | ORAL | Status: DC | PRN
  Administered 2020-05-24: 650 mg
  Filled 2020-05-24 (×2): qty 20.3

## 2020-05-24 MED ORDER — CHOLESTYRAMINE LIGHT 4 G PO PACK
4.0000 g | PACK | Freq: Four times a day (QID) | ORAL | Status: DC
  Administered 2020-05-24 – 2020-05-27 (×12): 4 g via ORAL
  Filled 2020-05-24 (×18): qty 1

## 2020-05-24 MED ORDER — METOPROLOL TARTRATE 12.5 MG HALF TABLET
12.5000 mg | ORAL_TABLET | Freq: Two times a day (BID) | ORAL | Status: DC
  Administered 2020-05-24 – 2020-05-26 (×6): 12.5 mg
  Filled 2020-05-24 (×8): qty 1

## 2020-05-24 MED ORDER — SODIUM CHLORIDE 0.9 % IV SOLN
INTRAVENOUS | Status: DC | PRN
  Administered 2020-05-24: 250 mL via INTRAVENOUS

## 2020-05-24 MED ORDER — SODIUM CHLORIDE 0.9 % IV SOLN: INTRAVENOUS | Status: DC

## 2020-05-24 MED ORDER — LOPERAMIDE HCL 1 MG/7.5ML PO SUSP
2.0000 mg | Freq: Four times a day (QID) | ORAL | Status: DC
  Administered 2020-05-24 – 2020-05-26 (×5): 2 mg
  Filled 2020-05-24 (×7): qty 15

## 2020-05-24 MED ORDER — LOPERAMIDE HCL 2 MG PO CAPS
2.0000 mg | ORAL_CAPSULE | Freq: Four times a day (QID) | ORAL | Status: DC
  Administered 2020-05-24 (×3): 2 mg via ORAL
  Filled 2020-05-24 (×4): qty 1

## 2020-05-24 MED ORDER — ROSUVASTATIN CALCIUM 5 MG PO TABS
10.0000 mg | ORAL_TABLET | Freq: Every day | ORAL | Status: DC
  Administered 2020-05-24 – 2020-05-25 (×2): 10 mg
  Filled 2020-05-24 (×2): qty 2

## 2020-05-24 MED ORDER — ACETAMINOPHEN 160 MG/5ML PO SOLN
650.0000 mg | Freq: Four times a day (QID) | ORAL | Status: DC | PRN
  Filled 2020-05-24: qty 20.3

## 2020-05-24 MED ORDER — ACETAMINOPHEN 325 MG PO TABS: 650.0000 mg | ORAL_TABLET | Freq: Four times a day (QID) | ORAL | Status: DC | PRN

## 2020-05-24 MED ORDER — ENSURE ENLIVE PO LIQD
237.0000 mL | Freq: Three times a day (TID) | ORAL | Status: DC
  Administered 2020-05-25 (×3): 237 mL

## 2020-05-24 MED ORDER — DIPHENOXYLATE-ATROPINE 2.5-0.025 MG/5ML PO LIQD
5.0000 mL | Freq: Four times a day (QID) | ORAL | Status: DC
  Administered 2020-05-24 – 2020-05-27 (×15): 5 mL
  Filled 2020-05-24 (×16): qty 5

## 2020-05-24 MED ORDER — MAGNESIUM SULFATE 2 GM/50ML IV SOLN
2.0000 g | Freq: Once | INTRAVENOUS | Status: AC
  Administered 2020-05-24: 2 g via INTRAVENOUS
  Filled 2020-05-24: qty 50

## 2020-05-24 MED ORDER — MIRTAZAPINE 15 MG PO TABS
7.5000 mg | ORAL_TABLET | Freq: Every day | ORAL | Status: DC
  Administered 2020-05-24: 7.5 mg
  Filled 2020-05-24: qty 1

## 2020-05-24 NOTE — Progress Notes (Signed)
Patient has been mostly unresponsive today with progression. This morning she would respond slightly to voice and at this time, she is only responding to tactile/painful stimulation. Her breathing was somewhat tachypneic this morning but has now progressed to kussmaul type breathing with no drop in oxygen saturation (96% on room air), and lungs remain clear at this time. Patient is still receiving maintenance IV fluids at 29mL/hr per nephrology, as well as osmolite tube feeding per coretrack/dobhoff feeding tube at 68mL/hr. She has faint, hypoactive bowel sounds in all quads and rectal tube is still in place, draining liquid stool, but no blood noted. Patient is extremely cachectic and has not been alert enough to take anything by mouth; moisture is provided by using wet sponge to mouth, tongue, teeth/gums, and lips, followed by lip moisturizer. Patient has minimal reflex to presence of the sponge in mouth. Patient does not appear to be in any discomfort at this time; she is being repositioned as much as possible, but tends to favor and return to her side. Spouse has visited twice today just for a short period of time, as she is not responsive. Upon his return I will try and make time to sit with him and explain her current condition. Nephrology was in earlier today and placed orders for dialysis today; however, this has not been done yet. Will continue to provide frequent contacts with patient as sheis unable to utilize call light and voice needs.

## 2020-05-24 NOTE — Progress Notes (Signed)
Patient continues to be nonresponsive, only to discomfort. Breathing has slowed a bit, but still Kussmaul in style. Dialysis was confirmed to be on hold today and will resume tomorrow.Spouse returned this afternoon and was surprised that she was still asleep; I spent time to explain to him that this may be progression of her illness, and we will have to take it day by day, but that she is still very ill. He verbalized understanding, but will definitely need to be continuously supported over the next few days as the patient's condition is tenuous. MEWS continues to be red due to the patient's respiratory rate and heart rate, neither of which have changed much all day, nor are they expected to . Additionally, her BP will occasionally be decreased but this, too is the nirm for the patient and not expected to change much. The physician is aware of the patient's vitals and current presentation, but this is the expected situation currently. As such, no new orders given. Will continue to monitor patient frequently and ensure she is comfortable.

## 2020-05-24 NOTE — Progress Notes (Addendum)
Patient ID: Karen Morrow, female   DOB: [birth year], 60 y.o.   MRN: [id number]  [facility] Progress Note   Assessment/ Plan:   1.  Recurrent GI bleed with acute blood loss anemia: She has previous history of documented gastric ulcers and has not been adherent with PPI/antacids at home.  EGD done on 12/22 showed gastritis/duodenitis without obvious ulcer.  With stable hemoglobin and hematocrit-higher today likely reflective of significant intravascular volume contraction from insensible losses/suboptimal intake.  Ongoing work-up for secretory diarrhea. 2. ESRD: She is usually on a TTS dialysis schedule and unfortunately did not have dialysis for about a week leading up to her hospitalization.  She underwent hemodialysis on 12/23 and will be scheduled for dialysis again tomorrow (holiday schedule).  She is hypovolemic on physical exam with flat jugular veins as well as tachycardia and hypotension with ongoing insensible losses and suboptimal intake.  I will start her on isotonic saline at 50 cc an hour to restore her back to close to euvolemic. 3.  Hyponatremia: This is likely from hypotonic fluids/free water intake in the setting of ESRD.  Monitor with isotonic saline. 4. CKD-MBD: Phosphorus level low-we will discontinue Auryxia at this point.  Continue Hectorol for PTH suppression; corrected calcium level is 9.1 and I will continue Sensipar at this time. 5.  Protein calorie malnutrition/failure to thrive: With significant reduction of appetite/weight loss with ongoing GI issues; yesterday had feeding tube placed through which tube feeds have been initiated to try and optimize caloric intake. 6.  Atrial fibrillation with rapid ventricular response: She is relatively hypotensive and concern raised with changes in mentation-she remains lethargic from a mental status and I will start her on maintenance IV fluids to try and counter insensible losses.  Subjective:   Briefly awakens to  calling out her name but then quickly falls back asleep without any verbal responses.   Objective:   BP 92/72   Pulse (!) 102   Temp 98 F (36.7 C) (Oral)   Resp 18   Ht 5\' 7"  (1.702 m)   Wt 42.8 kg   SpO2 96%   BMI 14.78 kg/m   Physical Exam: Gen: Appears lethargic, resting in bed.  No verbal responses. CVS: Pulse irregularly irregular tachycardia, S1 and S2 without murmur Resp: Clear to auscultation, no distinct rales or rhonchi Abd: Soft, scaphoid, nontender Ext: No lower extremity edema.  Dressings over left upper arm AVF.  Labs: BMET Recent Labs  Lab 05/19/20 1748 05/20/20 0400 05/20/20 0408 05/21/20 0310 05/21/20 1248 05/22/20 0334 05/22/20 0909 05/22/20 1543 05/23/20 0105 05/23/20 1233 05/23/20 2214 05/24/20 0226  NA  --  140   < > 135   < > 133* 133* 135 135 133* 129* 127*  K  --  2.4*   < > 2.3*   < > 2.8* 2.4* 3.1* 2.7* 2.6* 3.6 3.6  CL  --  103  --  103   < > 102 102 101 102 101 97* 97*  CO2  --  18*  --  20*   < > 18* 19* 25 22 22 22  21*  GLUCOSE  --  66*  --  86   < > 89 98 86 112* 93 114* 129*  BUN  --  36*  --  10   < > 12 13 <5* 5* 5* 7 9  CREATININE  --  10.97*  --  4.66*   < > 5.45* 5.58* 2.89* 3.07* 3.43* 3.69* 3.86*  CALCIUM  --  8.3*  --  7.7*   < > 7.7* 7.6* 7.1* 7.5* 7.7* 7.9* 7.9*  PHOS 7.2* 6.5*  --  2.1*  --   --  2.5  --   --   --   --  1.5*   < > = values in this interval not displayed.   CBC Recent Labs  Lab 05/19/20 1655 05/20/20 0400 05/20/20 0408 05/21/20 0310 05/22/20 0903 05/22/20 2118 05/23/20 0105  WBC 8.1 8.5  --  7.5 7.8  --  8.6  NEUTROABS 6.3  --   --   --   --   --   --   HGB 7.6* 11.6*   < > 12.9 11.5* 12.2 12.8  HCT 22.8* 35.3*   < > 36.7 34.2* 36.5 36.2  MCV 83.2 83.8  --  81.6 82.6  --  82.1  PLT 87* 100*  --  66* 59*  --  58*   < > = values in this interval not displayed.     Medications:    . cinacalcet  30 mg Oral Q supper  . diphenoxylate-atropine  5 mL Oral QID  . doxercalciferol  2 mcg  Intravenous Q T,Th,Sa-HD  . feeding supplement  237 mL Oral TID BM  . feeding supplement (PROSource TF)  45 mL Per Tube BID  . ferric citrate  210 mg Oral TID WC  . folic acid  1 mg Intravenous Daily  . metoprolol tartrate  12.5 mg Oral BID  . mirtazapine  7.5 mg Oral QHS  . multivitamin  1 tablet Per Tube QHS  . pantoprazole (PROTONIX) IV  40 mg Intravenous Q12H  . rosuvastatin  10 mg Oral Daily  . thiamine injection  100 mg Intravenous Daily   Elmarie Shiley, MD 05/24/2020, 7:50 AM

## 2020-05-24 NOTE — Progress Notes (Signed)
I was notified of pt with RED MEWS of 9. Pt has been RED all day since 7 am. Pt is now febrile at 102.4 F and has been treated with Tylenol. POC discussed with Freight forwarder. Recommended following ATTEND guidelines for MEWS.

## 2020-05-24 NOTE — Progress Notes (Addendum)
PROGRESS NOTE    Karen Morrow   HWE:[id number]  DOB: 09-29-1960  DOA: 05/19/2020 PCP: [name], MD   Brief Narrative:  Karen Morrow is a 59 y.o. F with ESRD on HD TThS, Afib not on AC due to rec GIB, HTN, dCHF, and hep C, PAD on Plavix who presented with 1 week rectal bleeding for which she was using sanitary pads.   Over the last week, patient has been using pads because of "leaking blood in stool".  In the ER was hypotensive to 89/64 and hypogylcemic to 50s and so was started on pressors and given glucose.  Hgb was 7.6 and she was given 2u PRBCs.   GI was consulted for the GI bleed.   Subjective: Quite sleepy today.  No complaints.    Assessment & Plan:   Active Problems: GI bleeding ? - of note, no blood in stool actually noted in the hospital- per ED doctor, patient had "black green stool in her diaper"- still was possibly this color due to being on Ferric Citrate.  - she has h/o of PUD with a gastric ulcer noted on EGD in 09/2018, 3/21, 5/21 - per husband patient has not been taking her meds for months - s/p EGD, no ulcer found but she does have gastritis - as of now, no longer has blood in stool but has ongoing diarrhea which the patient's husband states was present prior to the bloody stools- she has no complaints of abdominal pain or nausea    Watery Diarrhea-  -despite essentially being NPO due to poor oral intake, she still has diarrhea and is requiring a rectal tube- patient still not aware she is having diarrhea and thinks the rectal tube is something that is being used for dialysis - follow I and O as closely as possible - C diff antigen + but toxin neg-- not treating as C diff at this time- per GI, ok to use anti motility agents  - CT of the abdomen/pelvis 12/24-unrevealing for cause for her diarrhea-noted to have ascites and small pleural effusions -Have spoken with GI been recommended to start cholestyramine for her diarrhea-GI will consider a  colonoscopy on Monday if she is not improving -In the interim, continue Lomotil 4 times daily and cholestyramine- unfortunately, she appears to be getting worse  FTT - the patient has had significant weight loss and now has significant weakness  - I feel that the diarrhea is playing a large part in this and has to do with her loss of appetite - f/u GI work up to see if there is something reversible - palliative care discussions being had -Feeding tube was placed on 12/24 and tube feeds were started-she is tolerating these well however, diarrhea continues  Hypokalemia  - likely due to diarrhea and poor oral intake - replacing again today - follow closely  Hyponatremia -The patient is third spacing her fluids into her abdomen- normal saline 50 cc an hour started today by renal team  Hypomagnesemia - being replaced  Anemia - ? due to GI blood loss vs due to anemia of chronic disease - resolved after 2 u PRBC - Hb 7.6 on admission and is now ~ 12.9  Hypoglycemia - likely due to poor oral intake-CBG has been in the 50s on a few occasions  A-fib, permanent  - not on anticoagulation due to prior GI bleeding - Metoprolol on hold due to low BPs - developed RVR overnight - given IVF boluses and IV  anAmio bolus-  -Have resumed metoprolol cautiously and will watch BP closely-    Acute Thrombocytopenia  - Plt have been < 100 since admission- last noted to be 193 on 09/2019 (in [place])- no prior history of low platelets - ? If due to acute blood loss - not receiving any heparin products  ESRD - appreciate f/u by renal team- per notes, she has a h/o being non-adherent to dialysis and has missed a week of dialysis prior to this admission    Hepatic cirrhosis ? - noted on chart- fatty liver was noted on abdominal ultrasound on 07/2019 but not mentioned on prior CT report - no mention of cirrhosis at this time  Dementia?  - poor historian, forgetting to take meds at home, forgetful of recent  events - follow    Time spent in minutes: 35 DVT prophylaxis: SCDs Start: 05/20/20 0116 Place and maintain sequential compression device Start: 05/19/20 2226  Code Status: Full code Family Communication: with husband Disposition Plan:  Status is: Inpatient  Remains inpatient appropriate because:IV treatments appropriate due to intensity of illness or inability to take PO   Dispo: The patient is from: Home              Anticipated d/c is to: TBD              Anticipated d/c date is: 2 days              Patient currently is not medically stable to d/c.   Consultants:   GI  Nephrology  Palliative care Procedures:   EGD Antimicrobials:  Anti-infectives (From admission, onward)   None       Objective: Vitals:   05/24/20 0400 05/24/20 0500 05/24/20 0700 05/24/20 0800  BP: 94/74 92/72  (!) 87/71  Pulse:   (!) 114 (!) 114  Resp:    20  Temp:   98.4 F (36.9 C) 98.4 F (36.9 C)  TempSrc:    Oral  SpO2:   97% 97%  Weight:      Height:        Intake/Output Summary (Last 24 hours) at 05/24/2020 1116 Last data filed at 05/24/2020 1037 Gross per 24 hour  Intake 81.6 ml  Output 440 ml  Net -358.4 ml   Filed Weights   05/22/20 0840 05/22/20 1242 05/24/20 0300  Weight: 43.1 kg 43 kg 42.8 kg    Examination: General exam: Appears comfortable  HEENT: PERRLA, oral mucosa dry- no sclera icterus or thrush Respiratory system: Clear to auscultation. Respiratory effort normal. Cardiovascular system: S1 & S2 heard,  No murmurs  Gastrointestinal system: Abdomen soft, non-tender, moderately distended. Normal bowel sounds   Central nervous system:  No focal neurological deficits. Extremities: No cyanosis, clubbing or edema Skin: No rashes or ulcers   Data Reviewed: I have personally reviewed following labs and imaging studies  CBC: Recent Labs  Lab 05/19/20 1655 05/20/20 0400 05/20/20 0408 05/20/20 1644 05/21/20 0310 05/22/20 0903 05/22/20 2118  05/23/20 0105  WBC 8.1 8.5  --   --  7.5 7.8  --  8.6  NEUTROABS 6.3  --   --   --   --   --   --   --   HGB 7.6* 11.6*   < > 11.8* 12.9 11.5* 12.2 12.8  HCT 22.8* 35.3*   < > 34.4* 36.7 34.2* 36.5 36.2  MCV 83.2 83.8  --   --  81.6 82.6  --  82.1  PLT 87* 100*  --   --  66* 59*  --  58*   < > = values in this interval not displayed.   Basic Metabolic Panel: Recent Labs  Lab 05/19/20 1748 05/20/20 0400 05/20/20 0408 05/21/20 0310 05/21/20 1248 05/22/20 0909 05/22/20 1543 05/23/20 0105 05/23/20 1233 05/23/20 2214 05/24/20 0226  NA  --  140   < > 135   < > 133* 135 135 133* 129* 127*  K  --  2.4*   < > 2.3*   < > 2.4* 3.1* 2.7* 2.6* 3.6 3.6  CL  --  103  --  103   < > 102 101 102 101 97* 97*  CO2  --  18*  --  20*   < > 19* 25 22 22 22  21*  GLUCOSE  --  66*  --  86   < > 98 86 112* 93 114* 129*  BUN  --  36*  --  10   < > 13 <5* 5* 5* 7 9  CREATININE  --  10.97*  --  4.66*   < > 5.58* 2.89* 3.07* 3.43* 3.69* 3.86*  CALCIUM  --  8.3*  --  7.7*   < > 7.6* 7.1* 7.5* 7.7* 7.9* 7.9*  MG 1.9 1.8  --  1.7  --   --   --   --  1.5* 1.5* 2.2  PHOS 7.2* 6.5*  --  2.1*  --  2.5  --   --   --   --  1.5*   < > = values in this interval not displayed.   GFR: Estimated Creatinine Clearance: 10.5 mL/min (A) (by C-G formula based on SCr of 3.86 mg/dL (H)). Liver Function Tests: Recent Labs  Lab 05/19/20 1336 05/21/20 2029 05/22/20 0909 05/22/20 1543 05/24/20 0226  AST 28 25  --  19  --   ALT 14 14  --  12  --   ALKPHOS 57 61  --  61  --   BILITOT 1.0 0.8  --  1.2  --   PROT 3.9* 4.8*  --  4.9*  --   ALBUMIN 1.5* 1.9* 1.8* 2.0* 2.6*   No results for input(s): LIPASE, AMYLASE in the last 168 hours. Recent Labs  Lab 05/20/20 0545  AMMONIA 45*   Coagulation Profile: Recent Labs  Lab 05/19/20 1336  INR 1.7*   Cardiac Enzymes: Recent Labs  Lab 05/20/20 0545  CKTOTAL 207   BNP (last 3 results) No results for input(s): PROBNP in the last 8760 hours. HbA1C: No results for  input(s): HGBA1C in the last 72 hours. CBG: Recent Labs  Lab 05/23/20 1617 05/23/20 1959 05/24/20 0028 05/24/20 0418 05/24/20 0802  GLUCAP 70 97 122* 113* 91   Lipid Profile: No results for input(s): CHOL, HDL, LDLCALC, TRIG, CHOLHDL, LDLDIRECT in the last 72 hours. Thyroid Function Tests: No results for input(s): TSH, T4TOTAL, FREET4, T3FREE, THYROIDAB in the last 72 hours. Anemia Panel: Recent Labs    05/22/20 0334  VITAMINB12 750  FOLATE 3.5*  FERRITIN 402*   Urine analysis:    Component Value Date/Time   COLORURINE YELLOW 11/26/2013 1819   APPEARANCEUR TURBID (A) 11/26/2013 1819   LABSPEC 1.021 11/26/2013 1819   PHURINE 5.5 11/26/2013 1819   GLUCOSEU NEGATIVE 11/26/2013 1819   HGBUR SMALL (A) 11/26/2013 1819   BILIRUBINUR NEGATIVE 11/26/2013 1819   KETONESUR NEGATIVE 11/26/2013 1819   PROTEINUR >300 (A) 11/26/2013 1819   UROBILINOGEN 0.2 11/26/2013 1819   NITRITE POSITIVE (A) 11/26/2013 1819  LEUKOCYTESUR MODERATE (A) 11/26/2013 1819   Sepsis Labs: @LABRCNTIP (procalcitonin:4,lacticidven:4) ) Recent Results (from the past 240 hour(s))  Resp Panel by RT-PCR (Flu A&B, Covid) Nasopharyngeal Swab     Status: None   Collection Time: 05/19/20 10:57 AM   Specimen: Nasopharyngeal Swab; Nasopharyngeal(NP) swabs in vial transport medium  Result Value Ref Range Status   SARS Coronavirus 2 by RT PCR NEGATIVE NEGATIVE Final    Comment: (NOTE) SARS-CoV-2 target nucleic acids are NOT DETECTED.  The SARS-CoV-2 RNA is generally detectable in upper respiratory specimens during the acute phase of infection. The lowest concentration of SARS-CoV-2 viral copies this assay can detect is 138 copies/mL. A negative result does not preclude SARS-Cov-2 infection and should not be used as the sole basis for treatment or other patient management decisions. A negative result may occur with  improper specimen collection/handling, submission of specimen other than nasopharyngeal swab,  presence of viral mutation(s) within the areas targeted by this assay, and inadequate number of viral copies(<138 copies/mL). A negative result must be combined with clinical observations, patient history, and epidemiological information. The expected result is Negative.  Fact Sheet for Patients:  EntrepreneurPulse.com.au  Fact Sheet for Healthcare Providers:  IncredibleEmployment.be  This test is no t yet approved or cleared by the [place] FDA and  has been authorized for detection and/or diagnosis of SARS-CoV-2 by FDA under an Emergency Use Authorization (EUA). This EUA will remain  in effect (meaning this test can be used) for the duration of the COVID-19 declaration under Section 564(b)(1) of the Act, 21 U.S.C.section 360bbb-3(b)(1), unless the authorization is terminated  or revoked sooner.       Influenza A by PCR NEGATIVE NEGATIVE Final   Influenza B by PCR NEGATIVE NEGATIVE Final    Comment: (NOTE) The Xpert Xpress SARS-CoV-2/FLU/RSV plus assay is intended as an aid in the diagnosis of influenza from Nasopharyngeal swab specimens and should not be used as a sole basis for treatment. Nasal washings and aspirates are unacceptable for Xpert Xpress SARS-CoV-2/FLU/RSV testing.  Fact Sheet for Patients: EntrepreneurPulse.com.au  Fact Sheet for Healthcare Providers: IncredibleEmployment.be  This test is not yet approved or cleared by the [place] FDA and has been authorized for detection and/or diagnosis of SARS-CoV-2 by FDA under an Emergency Use Authorization (EUA). This EUA will remain in effect (meaning this test can be used) for the duration of the COVID-19 declaration under Section 564(b)(1) of the Act, 21 U.S.C. section 360bbb-3(b)(1), unless the authorization is terminated or revoked.  Performed at [facility], [place] [address] [place], [place] [address]   C Difficile Quick  Screen w PCR reflex     Status: Abnormal   Collection Time: 05/19/20  5:47 PM   Specimen: STOOL  Result Value Ref Range Status   C Diff antigen POSITIVE (A) NEGATIVE Final   C Diff toxin NEGATIVE NEGATIVE Final   C Diff interpretation Results are indeterminate. See PCR results.  Final    Comment: Performed at [facility], [place] [address] [place], [place] [address]  Gastrointestinal Panel by PCR , Stool     Status: None   Collection Time: 05/19/20  5:47 PM   Specimen: STOOL  Result Value Ref Range Status   Campylobacter species NOT DETECTED NOT DETECTED Final   Plesimonas shigelloides NOT DETECTED NOT DETECTED Final   Salmonella species NOT DETECTED NOT DETECTED Final   Yersinia enterocolitica NOT DETECTED NOT DETECTED Final   Vibrio species NOT DETECTED NOT DETECTED Final  Vibrio cholerae NOT DETECTED NOT DETECTED Final   Enteroaggregative E coli (EAEC) NOT DETECTED NOT DETECTED Final   Enteropathogenic E coli (EPEC) NOT DETECTED NOT DETECTED Final   Enterotoxigenic E coli (ETEC) NOT DETECTED NOT DETECTED Final   Shiga like toxin producing E coli (STEC) NOT DETECTED NOT DETECTED Final   Shigella/Enteroinvasive E coli (EIEC) NOT DETECTED NOT DETECTED Final   Cryptosporidium NOT DETECTED NOT DETECTED Final   Cyclospora cayetanensis NOT DETECTED NOT DETECTED Final   Entamoeba histolytica NOT DETECTED NOT DETECTED Final   Giardia lamblia NOT DETECTED NOT DETECTED Final   Adenovirus F40/41 NOT DETECTED NOT DETECTED Final   Astrovirus NOT DETECTED NOT DETECTED Final   Norovirus GI/GII NOT DETECTED NOT DETECTED Final   Rotavirus A NOT DETECTED NOT DETECTED Final   Sapovirus (I, II, IV, and V) NOT DETECTED NOT DETECTED Final    Comment: Performed at [facility], [address], [place], [place] [address]  C. Diff by PCR, Reflexed     Status: None   Collection Time: 05/19/20  5:47 PM  Result Value Ref Range Status   Toxigenic C. Difficile by PCR NEGATIVE NEGATIVE Final     Comment: Patient is colonized with non toxigenic C. difficile. May not need treatment unless significant symptoms are present. Performed at [facility], [place] [address], [place], [place] [address]          Radiology Studies: CT ABDOMEN PELVIS W CONTRAST  Result Date: 05/23/2020 CLINICAL DATA:  Rectal bleeding for 1 week, end-stage renal disease EXAM: CT ABDOMEN AND PELVIS WITH CONTRAST TECHNIQUE: Multidetector CT imaging of the abdomen and pelvis was performed using the standard protocol following bolus administration of intravenous contrast. CONTRAST:  126mL OMNIPAQUE IOHEXOL 300 MG/ML  SOLN COMPARISON:  09/29/2018 FINDINGS: Lower chest: There are small bilateral pleural effusions, left greater than right. Compressive atelectasis is seen within the bilateral lower lobes. The heart is enlarged without pericardial effusion. Marked calcification of the mitral annulus and aortic valve. Hepatobiliary: No focal liver abnormality is seen. No gallstones, gallbladder wall thickening, or biliary dilatation. Pancreas: Unremarkable. No pancreatic ductal dilatation or surrounding inflammatory changes. Spleen: Normal in size without focal abnormality. Adrenals/Urinary Tract: There is marked bilateral renal atrophy consistent with end-stage renal disease. Bladder is completely decompressed. Adrenals are unremarkable. Stomach/Bowel: Enteric catheter tip within the lumen of the gastric antrum. The stomach is decompressed, with nonspecific thickening of the rugal folds. Findings could reflect gastritis. No bowel obstruction or ileus. Normal appendix right lower quadrant. Vascular/Lymphatic: Extensive atherosclerosis of the aorta and its branches. No pathologic adenopathy. Reproductive: Status post hysterectomy. No adnexal masses. Other: Large volume of simple ascites throughout the abdomen and pelvis. No free intraperitoneal gas. No abdominal wall hernia. Musculoskeletal: The patient is cachectic. There is  diffuse anasarca. No acute or destructive bony lesions. Reconstructed images demonstrate no additional findings. IMPRESSION: 1. Large volume simple ascites throughout the abdomen and pelvis. 2. Small bilateral pleural effusions, left greater than right. 3. Nonspecific thickening of the rugal folds, which could reflect gastritis. No bowel obstruction or ileus. 4. Atrophic kidneys consistent with end-stage renal disease. 5. Aortic Atherosclerosis (ICD10-I70.0). Electronically Signed   By: Randa Ngo M.D.   On: 05/23/2020 17:12      Scheduled Meds: . cholestyramine light  4 g Oral QID  . cinacalcet  30 mg Oral Q supper  . diphenoxylate-atropine  5 mL Per Tube QID  . doxercalciferol  2 mcg Intravenous Q T,Th,Sa-HD  . feeding supplement  237 mL Oral TID BM  . feeding supplement (PROSource TF)  45 mL Per Tube BID  . folic acid  1 mg Intravenous Daily  . loperamide  2 mg Oral QID  . metoprolol tartrate  12.5 mg Per Tube BID  . mirtazapine  7.5 mg Per Tube QHS  . multivitamin  1 tablet Per Tube QHS  . pantoprazole (PROTONIX) IV  40 mg Intravenous Q12H  . rosuvastatin  10 mg Per Tube Daily  . thiamine injection  100 mg Intravenous Daily   Continuous Infusions: . sodium chloride Stopped (05/24/20 0228)  . sodium chloride 50 mL/hr at 05/24/20 0807  . feeding supplement (OSMOLITE 1.5 CAL) 1,000 mL (05/23/20 1411)  . sodium chloride Stopped (05/23/20 0537)     LOS: 4 days      Debbe Odea, MD [facility] Pager: [email or web address] 05/24/2020, 11:16 AM

## 2020-05-24 NOTE — Progress Notes (Signed)
Magnesium = 1.5. Sodium = 129. Calcium improving to 7.9. MD paged.

## 2020-05-25 DIAGNOSIS — I5032 Chronic diastolic (congestive) heart failure: Secondary | ICD-10-CM | POA: Diagnosis not present

## 2020-05-25 DIAGNOSIS — N186 End stage renal disease: Secondary | ICD-10-CM | POA: Diagnosis not present

## 2020-05-25 DIAGNOSIS — E876 Hypokalemia: Secondary | ICD-10-CM | POA: Diagnosis not present

## 2020-05-25 DIAGNOSIS — K219 Gastro-esophageal reflux disease without esophagitis: Secondary | ICD-10-CM | POA: Diagnosis not present

## 2020-05-25 LAB — BASIC METABOLIC PANEL
Anion gap: 8 (ref 5–15)
BUN: 19 mg/dL (ref 6–20)
CO2: 19 mmol/L — ABNORMAL LOW (ref 22–32)
Calcium: 7.4 mg/dL — ABNORMAL LOW (ref 8.9–10.3)
Chloride: 99 mmol/L (ref 98–111)
Creatinine, Ser: 4.65 mg/dL — ABNORMAL HIGH (ref 0.44–1.00)
GFR, Estimated: 10 mL/min — ABNORMAL LOW (ref >60)
Glucose, Bld: 100 mg/dL — ABNORMAL HIGH (ref 70–99)
Potassium: 4.1 mmol/L (ref 3.5–5.1)
Sodium: 126 mmol/L — ABNORMAL LOW (ref 135–145)

## 2020-05-25 LAB — CBC
HCT: 30.8 % — ABNORMAL LOW (ref 36.0–46.0)
HCT: 25.4 % — ABNORMAL LOW (ref 36.0–46.0)
Hemoglobin: 10.2 g/dL — ABNORMAL LOW (ref 12.0–15.0)
Hemoglobin: 8.8 g/dL — ABNORMAL LOW (ref 12.0–15.0)
MCH: 27.8 pg (ref 26.0–34.0)
MCH: 28.7 pg (ref 26.0–34.0)
MCHC: 33.1 g/dL (ref 30.0–36.0)
MCHC: 34.6 g/dL (ref 30.0–36.0)
MCV: 83.9 fL (ref 80.0–100.0)
MCV: 82.7 fL (ref 80.0–100.0)
Platelets: 61 10*3/uL — ABNORMAL LOW (ref 150–400)
Platelets: 63 10*3/uL — ABNORMAL LOW (ref 150–400)
RBC: 3.67 MIL/uL — ABNORMAL LOW (ref 3.87–5.11)
RBC: 3.07 MIL/uL — ABNORMAL LOW (ref 3.87–5.11)
RDW: 17.4 % — ABNORMAL HIGH (ref 11.5–15.5)
RDW: 17.6 % — ABNORMAL HIGH (ref 11.5–15.5)
WBC: 8.8 10*3/uL (ref 4.0–10.5)
WBC: 6.4 10*3/uL (ref 4.0–10.5)
nRBC: 0 % (ref 0.0–0.2)
nRBC: 0 % (ref 0.0–0.2)

## 2020-05-25 LAB — GLUCOSE, CAPILLARY
Glucose-Capillary: 104 mg/dL — ABNORMAL HIGH (ref 70–99)
Glucose-Capillary: 105 mg/dL — ABNORMAL HIGH (ref 70–99)
Glucose-Capillary: 111 mg/dL — ABNORMAL HIGH (ref 70–99)
Glucose-Capillary: 76 mg/dL (ref 70–99)
Glucose-Capillary: 99 mg/dL (ref 70–99)

## 2020-05-25 MED ORDER — DOXERCALCIFEROL 4 MCG/2ML IV SOLN
INTRAVENOUS | Status: AC
  Administered 2020-05-25: 2 ug
  Filled 2020-05-25: qty 2

## 2020-05-25 NOTE — Progress Notes (Signed)
Dr. Posey Pronto, nephrologist, at bedside with patient and met with spouse. End of life conversation was had and spouse agreed that he felt this was the case. Will attempt dialysis one last time today, offer support to both patient and family.

## 2020-05-25 NOTE — Plan of Care (Signed)
  Problem: Clinical Measurements: Goal: Complications related to the disease process, condition or treatment will be avoided or minimized Outcome: Progressing   Problem: Clinical Measurements: Goal: Will remain free from infection Outcome: Progressing Goal: Respiratory complications will improve Outcome: Progressing   Problem: Coping: Goal: Level of anxiety will decrease Outcome: Progressing   Problem: Pain Managment: Goal: General experience of comfort will improve Outcome: Progressing   Problem: Skin Integrity: Goal: Risk for impaired skin integrity will decrease Outcome: Progressing

## 2020-05-25 NOTE — Progress Notes (Signed)
Patient alert, responding to voice this morning. She is unable to speak but is appropriately nodding or shaking her head to questions. She is trying to keep her eyes open but is unable to do so for any length of time. Mouth care continue to be provided for comfort and removal of thick drainage. Her nose is also draining clear thin fluid. Coretrack remains in place at this time infusing osmolite at 91mL/hr. Bilateral Lung sounds are clear but diminished throughout except in anterior upper lobes, which have slight rales. Abdomen with faint hypoactive bowel sounds and is slightly distended, as she is beginning to exhibit anasarca/third spacing. She remains anuric with rectal tube in place, draining brown liquid stool to drainage bag. There is oozing around the tube at the rectum, but this is cleaned with repositioning patient and barrier cream is applied to the excoriated areas. Significant edema noted to pubic area and labia. HD fistula in left upper arm still with bruit and thrill, PIV to right FA patent, flushing well, NS discontinued. Spouse has agreed to make patient a DNR. Family at bedside visiting with patient at this time. Will continue to make frequent contact to ensure comfort.

## 2020-05-25 NOTE — Progress Notes (Signed)
Patient ID: Karen Morrow, female   DOB: 12-17-1960, 59 y.o.   MRN: [id number]  [facility] Progress Note   Assessment/ Plan:   1.  Recurrent GI bleed with acute blood loss anemia: With past history of gastric ulcers-spotty adherence with PPI/antacids at home.  EGD done on 12/22 showed gastritis/duodenitis without obvious ulcer. Hemoglobin and hematocrit remained stable-rise likely from intravascular volume contraction from poor oral intake/insensible losses. 2. ESRD: She is usually on a TTS dialysis schedule and unfortunately did not have dialysis for about a week leading up to her hospitalization.  She underwent hemodialysis on 12/23 and will get hemodialysis today. I fear that she is actively dying from cachexia. 3.  Hyponatremia: This is likely from hypotonic fluids/free water intake in the setting of ESRD.  Monitor with isotonic saline and further with dialysis. 4. CKD-MBD: Phosphorus level low-binders discontinued earlier.  Continue Hectorol for PTH suppression; corrected calcium level is 9.1 and I will continue Sensipar at this time. 5.  Protein calorie malnutrition/failure to thrive: With significant reduction of appetite/weight loss with ongoing GI issues; ongoing tube feeds at this time. 6.  Atrial fibrillation with rapid ventricular response: At this time, she is rate controlled but in atrial fibrillation. Continue maintenance IV fluids for volume support.  Subjective:   Awakens to conversation with some nonverbal responses such as nodding but no verbal responses.  I talked to her husband who was at bedside and explained to him that I fear that she is actively dying at this point from cachexia/FTT. Started on tube feeds for enteral nutrition and maintenance IV fluids earlier with minimal improvement so far. I proposed that if dialysis does not make a positive impact, we need to consider comfort measures only. He was emotional/tearful anticipating the worst and understands  our decision making.   Objective:   BP 93/72 (BP Location: Right Arm)   Pulse 92   Temp 98.9 F (37.2 C) (Oral)   Resp (!) 23   Ht 5\' 7"  (1.702 m)   Wt 49.8 kg   SpO2 100%   BMI 17.20 kg/m   Physical Exam: Gen: Comfortably resting in bed-lethargic to conversation. CVS: Pulse irregularly irregular tachycardia, S1 and S2 without murmur Resp: Clear to auscultation, no distinct rales or rhonchi Abd: Soft, scaphoid, nontender Ext: No lower extremity edema.  Dressings over left upper arm AVF.  Labs: BMET Recent Labs  Lab 05/19/20 1748 05/20/20 0400 05/20/20 0408 05/21/20 0310 05/21/20 1248 05/22/20 0909 05/22/20 1543 05/23/20 0105 05/23/20 1233 05/23/20 2214 05/24/20 0226 05/24/20 1651 05/25/20 0337  NA  --  140   < > 135   < > 133* 135 135 133* 129* 127* 128* 126*  K  --  2.4*   < > 2.3*   < > 2.4* 3.1* 2.7* 2.6* 3.6 3.6 4.2 4.1  CL  --  103  --  103   < > 102 101 102 101 97* 97* 101 99  CO2  --  18*  --  20*   < > 19* 25 22 22 22  21* 19* 19*  GLUCOSE  --  66*  --  86   < > 98 86 112* 93 114* 129* 94 100*  BUN  --  36*  --  10   < > 13 <5* 5* 5* 7 9 14 19   CREATININE  --  10.97*  --  4.66*   < > 5.58* 2.89* 3.07* 3.43* 3.69* 3.86* 4.32* 4.65*  CALCIUM  --  8.3*  --  7.7*   < > 7.6* 7.1* 7.5* 7.7* 7.9* 7.9* 7.7* 7.4*  PHOS 7.2* 6.5*  --  2.1*  --  2.5  --   --   --   --  1.5*  --   --    < > = values in this interval not displayed.   CBC Recent Labs  Lab 05/19/20 1655 05/20/20 0400 05/20/20 0408 05/21/20 0310 05/22/20 0903 05/22/20 2118 05/23/20 0105  WBC 8.1 8.5  --  7.5 7.8  --  8.6  NEUTROABS 6.3  --   --   --   --   --   --   HGB 7.6* 11.6*   < > 12.9 11.5* 12.2 12.8  HCT 22.8* 35.3*   < > 36.7 34.2* 36.5 36.2  MCV 83.2 83.8  --  81.6 82.6  --  82.1  PLT 87* 100*  --  66* 59*  --  58*   < > = values in this interval not displayed.     Medications:    . cholestyramine light  4 g Oral QID  . cinacalcet  30 mg Oral Q supper  . diphenoxylate-atropine   5 mL Per Tube QID  . doxercalciferol  2 mcg Intravenous Q T,Th,Sa-HD  . feeding supplement  237 mL Per Tube TID BM  . feeding supplement (PROSource TF)  45 mL Per Tube BID  . folic acid  1 mg Intravenous Daily  . loperamide HCl  2 mg Per Tube QID  . metoprolol tartrate  12.5 mg Per Tube BID  . mirtazapine  7.5 mg Per Tube QHS  . multivitamin  1 tablet Per Tube QHS  . pantoprazole (PROTONIX) IV  40 mg Intravenous Q12H  . rosuvastatin  10 mg Per Tube Daily  . thiamine injection  100 mg Intravenous Daily   Elmarie Shiley, MD 05/25/2020, 7:36 AM

## 2020-05-25 NOTE — Progress Notes (Signed)
PROGRESS NOTE    Karen Morrow   HYW:[id number]  DOB: Feb 10, 1961  DOA: 05/19/2020 PCP: [name], MD   Brief Narrative:  Karen Morrow is a 59 y.o. F with ESRD on HD TThS, Afib not on AC due to rec GIB, HTN, dCHF, and hep C, PAD on Plavix who presented with 1 week rectal bleeding for which she was using sanitary pads.   Over the last week, patient has been using pads because of "leaking blood in stool".  In the ER was hypotensive to 89/64 and hypogylcemic to 50s and so was started on pressors and given glucose.  Hgb was 7.6 and she was given 2u PRBCs.   GI was consulted for the GI bleed.   Subjective: Sleepy but awakens. States she feels fine but falls back asleep quickly.     Assessment & Plan:   Active Problems: GI bleeding  - of note, no blood in stool actually noted in the hospital- per ED doctor, patient had "black green stool in her diaper"- still was possibly this color due to being on Ferric Citrate.  - she has h/o of PUD with a gastric ulcer noted on EGD in 09/2018, 3/21, 5/21 - s/p EGD, no ulcer found but she does have gastritis- surgical path negative for malignancy - as of now, no longer has blood in stool but has ongoing diarrhea which the patient's husband states was present prior to the bloody stools- she has no complaints of abdominal pain or nausea    Watery Diarrhea-  -despite essentially being NPO due to poor oral intake, she still has diarrhea and is requiring a rectal tube- patient still not aware she is having diarrhea and thinks the rectal tube is something that is being used for dialysis - follow I and O as closely as possible - C diff antigen + but toxin neg-- not treating as C diff at this time- per GI, ok to use anti motility agents  - CT of the abdomen/pelvis 12/24-unrevealing for cause for her diarrhea-noted to have ascites and small pleural effusions -Have spoken with GI been recommended to start cholestyramine for her diarrhea-GI will  consider a colonoscopy on Monday if she is not improving -In the interim, continue Lomotil QID, Imodium QID and cholestyramine-  - 12/26> if I and O are correct, stool output dropped to about 400 cc yesterday- can continue current plan  FTT - the patient has had significant weight loss and now has significant weakness  - I feel that the diarrhea is playing a large part in this and has to do with her loss of appetite - f/u GI work up to see if there is something reversible - palliative care discussions being had -Feeding tube was placed on 12/24 and tube feeds were started-she is tolerating these well however, diarrhea continues - continues to appear quite weak. End of life discussion had with her husband today. He was tearful but understands her declining state. We spoke about transition to DNR status and he is in agreement with this but would like to continue treatment to see if she can turn around.  Hypokalemia  - likely due to diarrhea and poor oral intake - following a replacing as needed  Hyponatremia -The patient is third spacing her fluids into her abdomen- normal saline 50 cc an hour started by renal team- sodium noted to be slightly worse- Will d/c IVF as diarrhea is not as severe today and she is tolerating tube feeds  Hypomagnesemia -  being replaced  Anemia - ? due to GI blood loss vs due to anemia of chronic disease - resolved after 2 u PRBC - Hb 7.6 on admission and is now ~ 12.9  Hypoglycemia - likely due to poor oral intake-CBG has been in the 50s on a few occasions  A-fib, permanent  - not on anticoagulation due to prior GI bleeding - Metoprolol on hold due to low BPs - developed RVR overnight - given IVF boluses and IV anAmio bolus-  -Have resumed metoprolol cautiously and watching BP closely-HR now in 90s    Acute Thrombocytopenia  - Plt have been < 100 since admission- last noted to be 193 on 09/2019 (in [place])- no prior history of low platelets - ? If due to  acute blood loss - not receiving any heparin products  ESRD - appreciate f/u by renal team- per notes, she has a h/o being non-adherent to dialysis and has missed a week of dialysis prior to this admission    Hepatic cirrhosis ? - noted on chart- fatty liver was noted on abdominal ultrasound on 07/2019 but not mentioned on prior CT report - no mention of cirrhosis at this time  Dementia?  - poor historian, forgetting to take meds at home, forgetful of recent events - follow    Time spent in minutes: 35 DVT prophylaxis: SCDs Start: 05/20/20 0116 Place and maintain sequential compression device Start: 05/19/20 2226  Code Status: DNR Family Communication: with husband Disposition Plan:  Status is: Inpatient  Remains inpatient appropriate because:IV treatments appropriate due to intensity of illness or inability to take PO   Dispo: The patient is from: Home              Anticipated d/c is to: TBD              Anticipated d/c date is: 2 days              Patient currently is not medically stable to d/c.   Consultants:   GI  Nephrology  Palliative care Procedures:   EGD Antimicrobials:  Anti-infectives (From admission, onward)   None       Objective: Vitals:   05/25/20 0000 05/25/20 0402 05/25/20 0422 05/25/20 0700  BP: 100/76 93/72  (!) 87/74  Pulse: (!) 101 92  92  Resp: (!) 27 (!) 23  (!) 21  Temp: 99.9 F (37.7 C) 98.9 F (37.2 C)  98 F (36.7 C)  TempSrc: Oral Oral  Oral  SpO2: 98% 100%  100%  Weight:   49.8 kg   Height:        Intake/Output Summary (Last 24 hours) at 05/25/2020 1000 Last data filed at 05/25/2020 0500 Gross per 24 hour  Intake 1633.73 ml  Output 400 ml  Net 1233.73 ml   Filed Weights   05/22/20 1242 05/24/20 0300 05/25/20 0422  Weight: 43 kg 42.8 kg 49.8 kg    Examination: General exam: Appears comfortable - quite sleepy- voice is barely above a whisper now HEENT: PERRLA, oral mucosa moist, no sclera icterus or  thrush Respiratory system: Clear to auscultation. Respiratory effort normal. Cardiovascular system: S1 & S2 heard,  No murmurs  Gastrointestinal system: Abdomen soft, non-tender,  Moderately distended. Normal bowel sounds   Central nervous system: Alert and oriented. No focal neurological deficits. Extremities: No cyanosis, clubbing or edema Skin: No rashes or ulcers Psychiatry: flat affect   Data Reviewed: I have personally reviewed following labs and imaging studies  CBC: Recent  Labs  Lab 05/19/20 1655 05/20/20 0400 05/20/20 0408 05/20/20 1644 05/21/20 0310 05/22/20 0903 05/22/20 2118 05/23/20 0105  WBC 8.1 8.5  --   --  7.5 7.8  --  8.6  NEUTROABS 6.3  --   --   --   --   --   --   --   HGB 7.6* 11.6*   < > 11.8* 12.9 11.5* 12.2 12.8  HCT 22.8* 35.3*   < > 34.4* 36.7 34.2* 36.5 36.2  MCV 83.2 83.8  --   --  81.6 82.6  --  82.1  PLT 87* 100*  --   --  66* 59*  --  58*   < > = values in this interval not displayed.   Basic Metabolic Panel: Recent Labs  Lab 05/19/20 1748 05/20/20 0400 05/20/20 0408 05/21/20 0310 05/21/20 1248 05/22/20 0909 05/22/20 1543 05/23/20 1233 05/23/20 2214 05/24/20 0226 05/24/20 1651 05/25/20 0337  NA  --  140   < > 135   < > 133*   < > 133* 129* 127* 128* 126*  K  --  2.4*   < > 2.3*   < > 2.4*   < > 2.6* 3.6 3.6 4.2 4.1  CL  --  103  --  103   < > 102   < > 101 97* 97* 101 99  CO2  --  18*  --  20*   < > 19*   < > 22 22 21* 19* 19*  GLUCOSE  --  66*  --  86   < > 98   < > 93 114* 129* 94 100*  BUN  --  36*  --  10   < > 13   < > 5* 7 9 14 19   CREATININE  --  10.97*  --  4.66*   < > 5.58*   < > 3.43* 3.69* 3.86* 4.32* 4.65*  CALCIUM  --  8.3*  --  7.7*   < > 7.6*   < > 7.7* 7.9* 7.9* 7.7* 7.4*  MG 1.9 1.8  --  1.7  --   --   --  1.5* 1.5* 2.2 1.9  --   PHOS 7.2* 6.5*  --  2.1*  --  2.5  --   --   --  1.5*  --   --    < > = values in this interval not displayed.   GFR: Estimated Creatinine Clearance: 10.1 mL/min (A) (by C-G formula  based on SCr of 4.65 mg/dL (H)). Liver Function Tests: Recent Labs  Lab 05/19/20 1336 05/21/20 2029 05/22/20 0909 05/22/20 1543 05/24/20 0226  AST 28 25  --  19  --   ALT 14 14  --  12  --   ALKPHOS 57 61  --  61  --   BILITOT 1.0 0.8  --  1.2  --   PROT 3.9* 4.8*  --  4.9*  --   ALBUMIN 1.5* 1.9* 1.8* 2.0* 2.6*   No results for input(s): LIPASE, AMYLASE in the last 168 hours. Recent Labs  Lab 05/20/20 0545  AMMONIA 45*   Coagulation Profile: Recent Labs  Lab 05/19/20 1336  INR 1.7*   Cardiac Enzymes: Recent Labs  Lab 05/20/20 0545  CKTOTAL 207   BNP (last 3 results) No results for input(s): PROBNP in the last 8760 hours. HbA1C: No results for input(s): HGBA1C in the last 72 hours. CBG: Recent Labs  Lab 05/24/20 1607 05/24/20  2036 05/25/20 0017 05/25/20 0408 05/25/20 0730  GLUCAP 81 82 105* 104* 99   Lipid Profile: No results for input(s): CHOL, HDL, LDLCALC, TRIG, CHOLHDL, LDLDIRECT in the last 72 hours. Thyroid Function Tests: No results for input(s): TSH, T4TOTAL, FREET4, T3FREE, THYROIDAB in the last 72 hours. Anemia Panel: No results for input(s): VITAMINB12, FOLATE, FERRITIN, TIBC, IRON, RETICCTPCT in the last 72 hours. Urine analysis:    Component Value Date/Time   COLORURINE YELLOW 11/26/2013 1819   APPEARANCEUR TURBID (A) 11/26/2013 1819   LABSPEC 1.021 11/26/2013 1819   PHURINE 5.5 11/26/2013 1819   GLUCOSEU NEGATIVE 11/26/2013 1819   HGBUR SMALL (A) 11/26/2013 1819   BILIRUBINUR NEGATIVE 11/26/2013 1819   KETONESUR NEGATIVE 11/26/2013 1819   PROTEINUR >300 (A) 11/26/2013 1819   UROBILINOGEN 0.2 11/26/2013 1819   NITRITE POSITIVE (A) 11/26/2013 1819   LEUKOCYTESUR MODERATE (A) 11/26/2013 1819   Sepsis Labs: @LABRCNTIP (procalcitonin:4,lacticidven:4) ) Recent Results (from the past 240 hour(s))  Resp Panel by RT-PCR (Flu A&B, Covid) Nasopharyngeal Swab     Status: None   Collection Time: 05/19/20 10:57 AM   Specimen: Nasopharyngeal  Swab; Nasopharyngeal(NP) swabs in vial transport medium  Result Value Ref Range Status   SARS Coronavirus 2 by RT PCR NEGATIVE NEGATIVE Final    Comment: (NOTE) SARS-CoV-2 target nucleic acids are NOT DETECTED.  The SARS-CoV-2 RNA is generally detectable in upper respiratory specimens during the acute phase of infection. The lowest concentration of SARS-CoV-2 viral copies this assay can detect is 138 copies/mL. A negative result does not preclude SARS-Cov-2 infection and should not be used as the sole basis for treatment or other patient management decisions. A negative result may occur with  improper specimen collection/handling, submission of specimen other than nasopharyngeal swab, presence of viral mutation(s) within the areas targeted by this assay, and inadequate number of viral copies(<138 copies/mL). A negative result must be combined with clinical observations, patient history, and epidemiological information. The expected result is Negative.  Fact Sheet for Patients:  EntrepreneurPulse.com.au  Fact Sheet for Healthcare Providers:  IncredibleEmployment.be  This test is no t yet approved or cleared by the [place] FDA and  has been authorized for detection and/or diagnosis of SARS-CoV-2 by FDA under an Emergency Use Authorization (EUA). This EUA will remain  in effect (meaning this test can be used) for the duration of the COVID-19 declaration under Section 564(b)(1) of the Act, 21 U.S.C.section 360bbb-3(b)(1), unless the authorization is terminated  or revoked sooner.       Influenza A by PCR NEGATIVE NEGATIVE Final   Influenza B by PCR NEGATIVE NEGATIVE Final    Comment: (NOTE) The Xpert Xpress SARS-CoV-2/FLU/RSV plus assay is intended as an aid in the diagnosis of influenza from Nasopharyngeal swab specimens and should not be used as a sole basis for treatment. Nasal washings and aspirates are unacceptable for Xpert Xpress  SARS-CoV-2/FLU/RSV testing.  Fact Sheet for Patients: EntrepreneurPulse.com.au  Fact Sheet for Healthcare Providers: IncredibleEmployment.be  This test is not yet approved or cleared by the [place] FDA and has been authorized for detection and/or diagnosis of SARS-CoV-2 by FDA under an Emergency Use Authorization (EUA). This EUA will remain in effect (meaning this test can be used) for the duration of the COVID-19 declaration under Section 564(b)(1) of the Act, 21 U.S.C. section 360bbb-3(b)(1), unless the authorization is terminated or revoked.  Performed at [facility], [place] [address], [place], [place] [address]   C Difficile Quick Screen w PCR reflex  Status: Abnormal   Collection Time: 05/19/20  5:47 PM   Specimen: STOOL  Result Value Ref Range Status   C Diff antigen POSITIVE (A) NEGATIVE Final   C Diff toxin NEGATIVE NEGATIVE Final   C Diff interpretation Results are indeterminate. See PCR results.  Final    Comment: Performed at [facility], [place] [address] [place], [place] [address]  Gastrointestinal Panel by PCR , Stool     Status: None   Collection Time: 05/19/20  5:47 PM   Specimen: STOOL  Result Value Ref Range Status   Campylobacter species NOT DETECTED NOT DETECTED Final   Plesimonas shigelloides NOT DETECTED NOT DETECTED Final   Salmonella species NOT DETECTED NOT DETECTED Final   Yersinia enterocolitica NOT DETECTED NOT DETECTED Final   Vibrio species NOT DETECTED NOT DETECTED Final   Vibrio cholerae NOT DETECTED NOT DETECTED Final   Enteroaggregative E coli (EAEC) NOT DETECTED NOT DETECTED Final   Enteropathogenic E coli (EPEC) NOT DETECTED NOT DETECTED Final   Enterotoxigenic E coli (ETEC) NOT DETECTED NOT DETECTED Final   Shiga like toxin producing E coli (STEC) NOT DETECTED NOT DETECTED Final   Shigella/Enteroinvasive E coli (EIEC) NOT DETECTED NOT DETECTED Final   Cryptosporidium NOT DETECTED NOT  DETECTED Final   Cyclospora cayetanensis NOT DETECTED NOT DETECTED Final   Entamoeba histolytica NOT DETECTED NOT DETECTED Final   Giardia lamblia NOT DETECTED NOT DETECTED Final   Adenovirus F40/41 NOT DETECTED NOT DETECTED Final   Astrovirus NOT DETECTED NOT DETECTED Final   Norovirus GI/GII NOT DETECTED NOT DETECTED Final   Rotavirus A NOT DETECTED NOT DETECTED Final   Sapovirus (I, II, IV, and V) NOT DETECTED NOT DETECTED Final    Comment: Performed at [facility], [address] [place], [place] [address]  C. Diff by PCR, Reflexed     Status: None   Collection Time: 05/19/20  5:47 PM  Result Value Ref Range Status   Toxigenic C. Difficile by PCR NEGATIVE NEGATIVE Final    Comment: Patient is colonized with non toxigenic C. difficile. May not need treatment unless significant symptoms are present. Performed at [facility], [place] [address], [place], [place] [address]          Radiology Studies: CT ABDOMEN PELVIS W CONTRAST  Result Date: 05/23/2020 CLINICAL DATA:  Rectal bleeding for 1 week, end-stage renal disease EXAM: CT ABDOMEN AND PELVIS WITH CONTRAST TECHNIQUE: Multidetector CT imaging of the abdomen and pelvis was performed using the standard protocol following bolus administration of intravenous contrast. CONTRAST:  130mL OMNIPAQUE IOHEXOL 300 MG/ML  SOLN COMPARISON:  09/29/2018 FINDINGS: Lower chest: There are small bilateral pleural effusions, left greater than right. Compressive atelectasis is seen within the bilateral lower lobes. The heart is enlarged without pericardial effusion. Marked calcification of the mitral annulus and aortic valve. Hepatobiliary: No focal liver abnormality is seen. No gallstones, gallbladder wall thickening, or biliary dilatation. Pancreas: Unremarkable. No pancreatic ductal dilatation or surrounding inflammatory changes. Spleen: Normal in size without focal abnormality. Adrenals/Urinary Tract: There is marked bilateral renal  atrophy consistent with end-stage renal disease. Bladder is completely decompressed. Adrenals are unremarkable. Stomach/Bowel: Enteric catheter tip within the lumen of the gastric antrum. The stomach is decompressed, with nonspecific thickening of the rugal folds. Findings could reflect gastritis. No bowel obstruction or ileus. Normal appendix right lower quadrant. Vascular/Lymphatic: Extensive atherosclerosis of the aorta and its branches. No pathologic adenopathy. Reproductive: Status post hysterectomy. No adnexal masses. Other: Large volume  of simple ascites throughout the abdomen and pelvis. No free intraperitoneal gas. No abdominal wall hernia. Musculoskeletal: The patient is cachectic. There is diffuse anasarca. No acute or destructive bony lesions. Reconstructed images demonstrate no additional findings. IMPRESSION: 1. Large volume simple ascites throughout the abdomen and pelvis. 2. Small bilateral pleural effusions, left greater than right. 3. Nonspecific thickening of the rugal folds, which could reflect gastritis. No bowel obstruction or ileus. 4. Atrophic kidneys consistent with end-stage renal disease. 5. Aortic Atherosclerosis (ICD10-I70.0). Electronically Signed   By: Randa Ngo M.D.   On: 05/23/2020 17:12      Scheduled Meds: . cholestyramine light  4 g Oral QID  . cinacalcet  30 mg Oral Q supper  . diphenoxylate-atropine  5 mL Per Tube QID  . doxercalciferol  2 mcg Intravenous Q T,Th,Sa-HD  . feeding supplement  237 mL Per Tube TID BM  . feeding supplement (PROSource TF)  45 mL Per Tube BID  . folic acid  1 mg Intravenous Daily  . loperamide HCl  2 mg Per Tube QID  . metoprolol tartrate  12.5 mg Per Tube BID  . mirtazapine  7.5 mg Per Tube QHS  . multivitamin  1 tablet Per Tube QHS  . pantoprazole (PROTONIX) IV  40 mg Intravenous Q12H  . rosuvastatin  10 mg Per Tube Daily  . thiamine injection  100 mg Intravenous Daily   Continuous Infusions: . sodium chloride Stopped  (05/24/20 0228)  . sodium chloride 50 mL/hr at 05/24/20 1211  . feeding supplement (OSMOLITE 1.5 CAL) 1,000 mL (05/23/20 1411)  . sodium chloride Stopped (05/23/20 0537)     LOS: 5 days      Debbe Odea, MD [facility] Pager: [email or web address] 05/25/2020, 10:00 AM

## 2020-05-26 DIAGNOSIS — E876 Hypokalemia: Secondary | ICD-10-CM | POA: Diagnosis not present

## 2020-05-26 DIAGNOSIS — N186 End stage renal disease: Secondary | ICD-10-CM | POA: Diagnosis not present

## 2020-05-26 DIAGNOSIS — I5032 Chronic diastolic (congestive) heart failure: Secondary | ICD-10-CM | POA: Diagnosis not present

## 2020-05-26 DIAGNOSIS — K219 Gastro-esophageal reflux disease without esophagitis: Secondary | ICD-10-CM | POA: Diagnosis not present

## 2020-05-26 LAB — GLUCOSE, CAPILLARY
Glucose-Capillary: 105 mg/dL — ABNORMAL HIGH (ref 70–99)
Glucose-Capillary: 95 mg/dL (ref 70–99)
Glucose-Capillary: 95 mg/dL (ref 70–99)
Glucose-Capillary: 96 mg/dL (ref 70–99)
Glucose-Capillary: 97 mg/dL (ref 70–99)

## 2020-05-26 LAB — BASIC METABOLIC PANEL
Anion gap: 10 (ref 5–15)
BUN: 15 mg/dL (ref 6–20)
CO2: 21 mmol/L — ABNORMAL LOW (ref 22–32)
Calcium: 7.3 mg/dL — ABNORMAL LOW (ref 8.9–10.3)
Chloride: 100 mmol/L (ref 98–111)
Creatinine, Ser: 3.06 mg/dL — ABNORMAL HIGH (ref 0.44–1.00)
GFR, Estimated: 17 mL/min — ABNORMAL LOW (ref >60)
Glucose, Bld: 96 mg/dL (ref 70–99)
Potassium: 3.9 mmol/L (ref 3.5–5.1)
Sodium: 131 mmol/L — ABNORMAL LOW (ref 135–145)

## 2020-05-26 MED ORDER — FOLIC ACID 1 MG PO TABS
1.0000 mg | ORAL_TABLET | Freq: Every day | ORAL | Status: DC
  Administered 2020-05-27: 1 mg
  Filled 2020-05-26: qty 1

## 2020-05-26 MED ORDER — CLOPIDOGREL BISULFATE 75 MG PO TABS
75.0000 mg | ORAL_TABLET | Freq: Every day | ORAL | Status: DC
  Administered 2020-05-26 – 2020-05-27 (×2): 75 mg via ORAL
  Filled 2020-05-26 (×2): qty 1

## 2020-05-26 MED ORDER — CALCIUM ACETATE (PHOS BINDER) 667 MG PO CAPS
1334.0000 mg | ORAL_CAPSULE | Freq: Three times a day (TID) | ORAL | Status: DC
  Administered 2020-05-27 (×2): 1334 mg via ORAL
  Filled 2020-05-26 (×4): qty 2

## 2020-05-26 MED ORDER — PANTOPRAZOLE SODIUM 40 MG PO PACK
40.0000 mg | PACK | Freq: Every day | ORAL | Status: DC
  Administered 2020-05-26 – 2020-05-27 (×2): 40 mg
  Filled 2020-05-26 (×3): qty 20

## 2020-05-26 NOTE — Plan of Care (Signed)
  Problem: Fluid Volume: Goal: Compliance with measures to maintain balanced fluid volume will improve Outcome: Progressing   Problem: Coping: Goal: Level of anxiety will decrease Outcome: Progressing

## 2020-05-26 NOTE — Progress Notes (Signed)
Patient alert and oriented x 1 this morning upon initial shift assessment. She was talking to nurse, joking and laughing with eyes open- significant difference from the previous 3 days. Patient denied any pain or discomfort ans was able to take a few small spoonfuls of applesauce with her plavix and metoprolol, although she still had difficulty and needed extra time to do so. With the help of the nurse tech, patient was given a good bed bath today, full linen and gown change, returned tele leads to the chest as she has not been pulling at tubes and lines, and changed all prophylactic dressings. While inspecting the skin she was noted to have 3 new stage one pressure injuries: 1 to the right lateral ischial tuberosity, one to the right posterior lateral lumbar/rib area, and one to the right anterior lateral chest/rib area. Patient denies discomfort when these areas are touched/pressure applied. She continues to have excoriation to rectal, labial and overall periareas for which barrier cream is applied with each position change. Rectal tube is still in place draining liquid stool to drainage bag. Along with being more alert today, her generalized edema is decreased, likely due to yesterday's dialysis. Attempted to feed her lunch but she was unable to stay awake long enough to safely eat. As such, this afternoon she has become lethargic again and is barely opening her eyes to voice. He BP has been all over the place today but has remained quite soft since about noon. HR is tachy which has been her baseline, and oxygen sats are in the mid to upper 90s. Patient was also unable to effectively participate in therapy earlier this afternoon. Will continue to make frequent contacts as patient is unable to effectively make needs known.

## 2020-05-26 NOTE — Evaluation (Signed)
Physical Therapy Evaluation Patient Details Name: Karen Morrow MRN: [id number] DOB: 03/21/1961 Today's Date: 05/26/2020   History of Present Illness  59yo female presenting with 1 week of GIB, missed HD sessions due to her bleeding. PMH A-fib, CHF, CKD, hep C, HTN  Clinical Impression   Patient received in bed, lethargic but briefly opens eyes to verbal and tactile stimulation and does make efforts to initiate moving BLEs to EOB. Able to get to EOB with totalAx1, however unable to maintain midline sitting without external support- immediately falls to her left when support is removed. Session very much limited by lethargy. Left positioned to comfort with bed alarm active and RN present, all needs otherwise met. Recommending SNF for now, however I am hopeful that we may be able to progress to CIR level when she is less lethargic.     Follow Up Recommendations SNF;Supervision/Assistance - 24 hour    Equipment Recommendations  Rolling walker with 5" wheels;3in1 (PT);Wheelchair (measurements PT);Wheelchair cushion (measurements PT)    Recommendations for Other Services       Precautions / Restrictions Precautions Precautions: Fall;Other (comment) Precaution Comments: cortrak, rectal tube Restrictions Weight Bearing Restrictions: No      Mobility  Bed Mobility Overal bed mobility: Needs Assistance Bed Mobility: Supine to Sit;Sit to Supine     Supine to sit: Total assist Sit to supine: Total assist   General bed mobility comments: totalA for all aspects of BLE and trunk management today, small effort to manage BLEs noted but overall limited by lethargy    Transfers                 General transfer comment: deferred- lethargy  Ambulation/Gait             General Gait Details: deferred- lethargy  Stairs            Wheelchair Mobility    Modified Rankin (Stroke Patients Only)       Balance Overall balance assessment: Needs  assistance Sitting-balance support: Bilateral upper extremity supported;Feet supported Sitting balance-Leahy Scale: Zero Sitting balance - Comments: immediately loses balance and falls to her left when external support is removed                                     Pertinent Vitals/Pain Pain Assessment: Faces Faces Pain Scale: No hurt Pain Intervention(s): Limited activity within patient's tolerance;Monitored during session    Home Living Family/patient expects to be discharged to:: Private residence Living Arrangements: Spouse/significant other Available Help at Discharge: Family Type of Home: Apartment Home Access: Level entry     Home Layout: One level Home Equipment: None Additional Comments: husband works in Paramedic at [employer]    Prior Function Level of Independence: Independent         Comments: patient too lethargic to provide information- all PLOF info taken from prior charting     Hand Dominance        Extremity/Trunk Assessment   Upper Extremity Assessment Upper Extremity Assessment: Generalized weakness;Difficult to assess due to impaired cognition    Lower Extremity Assessment Lower Extremity Assessment: Generalized weakness;Difficult to assess due to impaired cognition    Cervical / Trunk Assessment Cervical / Trunk Assessment: Kyphotic  Communication      Cognition Arousal/Alertness: Lethargic Behavior During Therapy: Flat affect Overall Cognitive Status: Difficult to assess  General Comments: very diffiicult to assess cognition at eval due to lethargy      General Comments General comments (skin integrity, edema, etc.): HR 111 at most, RR up to 38 with mobility, SPO2 WNL on RA    Exercises     Assessment/Plan    PT Assessment Patient needs continued PT services  PT Problem List Decreased strength;Decreased cognition;Decreased knowledge of use of DME;Decreased  activity tolerance;Decreased safety awareness;Decreased balance;Decreased mobility;Decreased coordination       PT Treatment Interventions DME instruction;Balance training;Gait training;Stair training;Cognitive remediation;Functional mobility training;Patient/family education;Therapeutic activities;Therapeutic exercise    PT Goals (Current goals can be found in the Care Plan section)  Acute Rehab PT Goals PT Goal Formulation: Patient unable to participate in goal setting Time For Goal Achievement: 06/09/20 Potential to Achieve Goals: Fair    Frequency Min 3X/week   Barriers to discharge        Co-evaluation               AM-PAC PT "6 Clicks" Mobility  Outcome Measure Help needed turning from your back to your side while in a flat bed without using bedrails?: A Lot Help needed moving from lying on your back to sitting on the side of a flat bed without using bedrails?: Total Help needed moving to and from a bed to a chair (including a wheelchair)?: Total Help needed standing up from a chair using your arms (e.g., wheelchair or bedside chair)?: Total Help needed to walk in hospital room?: Total Help needed climbing 3-5 steps with a railing? : Total 6 Click Score: 7    End of Session Equipment Utilized During Treatment: Other (comment) (rectal tube, cortrak) Activity Tolerance: Patient limited by lethargy Patient left: in bed;with call bell/phone within reach;with bed alarm set;with nursing/sitter in room Nurse Communication: Mobility status PT Visit Diagnosis: Unsteadiness on feet (R26.81);Muscle weakness (generalized) (M62.81);Difficulty in walking, not elsewhere classified (R26.2)    Time: 1314-1330 PT Time Calculation (min) (ACUTE ONLY): 16 min   Charges:   PT Evaluation $PT Eval Moderate Complexity: 1 Mod          Windell Norfolk, DPT, PN1   Supplemental Physical Therapist [place]    Pager [phone] Acute Rehab Office [phone]

## 2020-05-26 NOTE — Progress Notes (Addendum)
Patient MEWS is red but this is not unusual for patient as she is actively dying. She frequently has low BP with increased heart rate that will decrease throughout the day. Physicians are not concerned about the MEWS score at this time. She is more alert today than she has been in the last 3 days, but these vitals are expected.

## 2020-05-26 NOTE — Progress Notes (Signed)
All patient's vitals havereturned to within normal limits other than her HR, which remains at 111, but this is normal for her. Will continue to monitor.

## 2020-05-26 NOTE — Progress Notes (Addendum)
PROGRESS NOTE    Karen Morrow   BDZ:[id number]  DOB: 05-Apr-1961  DOA: 05/19/2020 PCP: [name], MD   Brief Narrative:  Karen Morrow is a 59 y.o. F with ESRD on HD TThS, Afib not on AC due to rec GIB, HTN, dCHF, and hep C, PAD on Plavix who presented with 1 week rectal bleeding for which she was using sanitary pads.   Over the last week, patient has been using pads because of "leaking blood in stool".  In the ER was hypotensive to 89/64 and hypogylcemic to 50s and so was started on pressors and given glucose.  Hgb was 7.6 and she was given 2u PRBCs.   GI was consulted for the GI bleed.   Subjective: Awake today. She has no complaints. No pain or nausea. I have updated her husband.     Assessment & Plan:   Active Problems: GI bleeding  - of note, no blood in stool actually noted in the hospital- per ED doctor, patient had "black green stool in her diaper"- still was possibly this color due to being on Ferric Citrate.  - she has h/o of PUD with a gastric ulcer noted on EGD in 09/2018, 3/21, 5/21 - s/p EGD, no ulcer found but she does have gastritis- surgical path negative for malignancy - as of now, no longer has blood in stool but has ongoing diarrhea which the patient's husband states was present prior to the bloody stools- she has no complaints of abdominal pain or nausea    Watery Diarrhea-  -despite essentially being NPO due to poor oral intake, she still has diarrhea and is requiring a rectal tube- patient still not aware she is having diarrhea and thinks the rectal tube is something that is being used for dialysis - follow I and O as closely as possible - C diff antigen + but toxin neg-- not treating as C diff at this time- per GI, ok to use anti motility agents  - CT of the abdomen/pelvis 12/24-unrevealing for cause for her diarrhea-noted to have ascites and small pleural effusions -Have spoken with GI been recommended to start cholestyramine for her  diarrhea-GI will consider a colonoscopy on Monday if she is not improving -In the interim, continue Lomotil QID, Imodium QID and cholestyramine-  - diarrhea has resolved - will d/c Imodium and see if Lomotic and cholestyramine will be sufficient to control diarrhea - cont tube feeds- allow patient to eat as well if she has an appetite- husband aware that we will give her 1 wk to see if appetite returns and she begins to eat prior to discussing placing a PEG- he further understands that if he pursues a PEG, she will likely have to live at a SNF as she will not be able to give herself tube feeds and he is not home in the daytime to do so because of his job. Regardless of whether he pursues a PEG, she will likely need to go to SNF for short term rehab due to the extent of her weakness- he understands all of this  FTT- prognosis and disposition - the patient has had significant weight loss and now has significant weakness  - I feel that the diarrhea is playing a large part in this and has to do with her loss of appetite - f/u GI work up to see if there is something reversible - palliative care discussions being had -Feeding tube was placed on 12/24 and tube feeds were started-she  is tolerating these well  - I am continuing to have discussions about long term goals with her husband- the patient has a poor understanding of her condition and I believe she may also have early dementia- she is not a reliable to make decisions for herself and I am allowing complicated decisions to be made by her husband  Hypokalemia  - likely due to diarrhea and poor oral intake - improved after diarrhea resolved  Hyponatremia -The patient is third spacing fluids into her abdomen- sodium better after dialysis- avoid IVF  Hypomagnesemia -  eplaced  Anemia - ? due to GI blood loss vs due to anemia of chronic disease - resolved after 2 u PRBC - Hb 7.6 on admission and is now ~ 12.9  Hypoglycemia - likely due to poor  oral intake-CBG has been in the 50s on a few occasions but has improved after tube feeds started  A-fib, paroxysmal - not on anticoagulation due to prior GI bleeding - Metoprolol on hold due to low BPs - developed RVR overnight - given IVF boluses and IV anAmio bolus-  -Have resumed metoprolol cautiously and watching BP closely-HR now in 90s    Acute Thrombocytopenia  - Plt have been < 100 since admission- last noted to be 193 on 09/2019 (in [place])- no prior history of low platelets - ? If due to acute blood loss - not receiving any heparin products  ESRD - appreciate f/u by renal team- per notes, she has a h/o being non-adherent to dialysis and has missed a week of dialysis prior to this admission  PAD - resume Plavix and follow platelets    Hepatic cirrhosis ? - noted on chart- fatty liver was noted on abdominal ultrasound on 07/2019 but not mentioned on prior CT report - no mention of cirrhosis at this time  Dementia?  - poor historian, forgetting to take meds at home, forgetful of recent events - follow    Time spent in minutes: 35 DVT prophylaxis: SCDs Start: 05/20/20 0116 Place and maintain sequential compression device Start: 05/19/20 2226  Code Status: DNR Family Communication: with husband Disposition Plan:  Status is: Inpatient  Remains inpatient appropriate because:IV treatments appropriate due to intensity of illness or inability to take PO   Dispo: The patient is from: Home              Anticipated d/c is to: TBD              Anticipated d/c date is: 2 days              Patient currently is not medically stable to d/c.   Consultants:   GI  Nephrology  Palliative care Procedures:   EGD Antimicrobials:  Anti-infectives (From admission, onward)   None       Objective: Vitals:   05/26/20 0717 05/26/20 0800 05/26/20 0805 05/26/20 0849  BP: (!) 88/69 98/65 98/65  98/65  Pulse: (!) 110  (!) 111 (!) 111  Resp: (!) 26 19 19 19   Temp: 99.4 F (37.4  C) 99.6 F (37.6 C) 99.6 F (37.6 C) 99.6 F (37.6 C)  TempSrc:      SpO2:  100%    Weight:      Height:        Intake/Output Summary (Last 24 hours) at 05/26/2020 1102 Last data filed at 05/25/2020 1903 Gross per 24 hour  Intake --  Output -300 ml  Net 300 ml   Filed Weights   05/25/20 1600  05/25/20 1903 05/26/20 0350  Weight: 49.8 kg 50 kg 52 kg    Examination: General exam: Appears comfortable  HEENT: PERRLA, oral mucosa moist, no sclera icterus or thrush Respiratory system: Clear to auscultation. Respiratory effort normal. Cardiovascular system: S1 & S2 heard,  No murmurs  Gastrointestinal system: Abdomen soft, non-tender, mildly distended. Normal bowel sounds   Central nervous system: Alert and oriented x 3 but not oriented well to situation- No focal neurological deficits. Extremities: No cyanosis, clubbing or edema Skin: No rashes or ulcers Psychiatry:  flat affect  Data Reviewed: I have personally reviewed following labs and imaging studies  CBC: Recent Labs  Lab 05/19/20 1655 05/20/20 0400 05/21/20 0310 05/22/20 0903 05/22/20 2118 05/23/20 0105 05/25/20 0337 05/25/20 1639  WBC 8.1   < > 7.5 7.8  --  8.6 8.8 6.4  NEUTROABS 6.3  --   --   --   --   --   --   --   HGB 7.6*   < > 12.9 11.5* 12.2 12.8 10.2* 8.8*  HCT 22.8*   < > 36.7 34.2* 36.5 36.2 30.8* 25.4*  MCV 83.2   < > 81.6 82.6  --  82.1 83.9 82.7  PLT 87*   < > 66* 59*  --  58* 61* 63*   < > = values in this interval not displayed.   Basic Metabolic Panel: Recent Labs  Lab 05/19/20 1748 05/20/20 0400 05/20/20 0408 05/21/20 0310 05/21/20 1248 05/22/20 0909 05/22/20 1543 05/23/20 1233 05/23/20 2214 05/24/20 0226 05/24/20 1651 05/25/20 0337 05/26/20 0203  NA  --  140   < > 135   < > 133*   < > 133* 129* 127* 128* 126* 131*  K  --  2.4*   < > 2.3*   < > 2.4*   < > 2.6* 3.6 3.6 4.2 4.1 3.9  CL  --  103  --  103   < > 102   < > 101 97* 97* 101 99 100  CO2  --  18*  --  20*   < > 19*    < > 22 22 21* 19* 19* 21*  GLUCOSE  --  66*  --  86   < > 98   < > 93 114* 129* 94 100* 96  BUN  --  36*  --  10   < > 13   < > 5* 7 9 14 19 15   CREATININE  --  10.97*  --  4.66*   < > 5.58*   < > 3.43* 3.69* 3.86* 4.32* 4.65* 3.06*  CALCIUM  --  8.3*  --  7.7*   < > 7.6*   < > 7.7* 7.9* 7.9* 7.7* 7.4* 7.3*  MG 1.9 1.8  --  1.7  --   --   --  1.5* 1.5* 2.2 1.9  --   --   PHOS 7.2* 6.5*  --  2.1*  --  2.5  --   --   --  1.5*  --   --   --    < > = values in this interval not displayed.   GFR: Estimated Creatinine Clearance: 16 mL/min (A) (by C-G formula based on SCr of 3.06 mg/dL (H)). Liver Function Tests: Recent Labs  Lab 05/19/20 1336 05/21/20 2029 05/22/20 0909 05/22/20 1543 05/24/20 0226  AST 28 25  --  19  --   ALT 14 14  --  12  --  ALKPHOS 57 61  --  61  --   BILITOT 1.0 0.8  --  1.2  --   PROT 3.9* 4.8*  --  4.9*  --   ALBUMIN 1.5* 1.9* 1.8* 2.0* 2.6*   No results for input(s): LIPASE, AMYLASE in the last 168 hours. Recent Labs  Lab 05/20/20 0545  AMMONIA 45*   Coagulation Profile: Recent Labs  Lab 05/19/20 1336  INR 1.7*   Cardiac Enzymes: Recent Labs  Lab 05/20/20 0545  CKTOTAL 207   BNP (last 3 results) No results for input(s): PROBNP in the last 8760 hours. HbA1C: No results for input(s): HGBA1C in the last 72 hours. CBG: Recent Labs  Lab 05/25/20 0730 05/25/20 1159 05/25/20 2029 05/26/20 0005 05/26/20 0346  GLUCAP 99 111* 76 97 95   Lipid Profile: No results for input(s): CHOL, HDL, LDLCALC, TRIG, CHOLHDL, LDLDIRECT in the last 72 hours. Thyroid Function Tests: No results for input(s): TSH, T4TOTAL, FREET4, T3FREE, THYROIDAB in the last 72 hours. Anemia Panel: No results for input(s): VITAMINB12, FOLATE, FERRITIN, TIBC, IRON, RETICCTPCT in the last 72 hours. Urine analysis:    Component Value Date/Time   COLORURINE YELLOW 11/26/2013 1819   APPEARANCEUR TURBID (A) 11/26/2013 1819   LABSPEC 1.021 11/26/2013 1819   PHURINE 5.5  11/26/2013 1819   GLUCOSEU NEGATIVE 11/26/2013 1819   HGBUR SMALL (A) 11/26/2013 1819   BILIRUBINUR NEGATIVE 11/26/2013 1819   KETONESUR NEGATIVE 11/26/2013 1819   PROTEINUR >300 (A) 11/26/2013 1819   UROBILINOGEN 0.2 11/26/2013 1819   NITRITE POSITIVE (A) 11/26/2013 1819   LEUKOCYTESUR MODERATE (A) 11/26/2013 1819   Sepsis Labs: @LABRCNTIP (procalcitonin:4,lacticidven:4) ) Recent Results (from the past 240 hour(s))  Resp Panel by RT-PCR (Flu A&B, Covid) Nasopharyngeal Swab     Status: None   Collection Time: 05/19/20 10:57 AM   Specimen: Nasopharyngeal Swab; Nasopharyngeal(NP) swabs in vial transport medium  Result Value Ref Range Status   SARS Coronavirus 2 by RT PCR NEGATIVE NEGATIVE Final    Comment: (NOTE) SARS-CoV-2 target nucleic acids are NOT DETECTED.  The SARS-CoV-2 RNA is generally detectable in upper respiratory specimens during the acute phase of infection. The lowest concentration of SARS-CoV-2 viral copies this assay can detect is 138 copies/mL. A negative result does not preclude SARS-Cov-2 infection and should not be used as the sole basis for treatment or other patient management decisions. A negative result may occur with  improper specimen collection/handling, submission of specimen other than nasopharyngeal swab, presence of viral mutation(s) within the areas targeted by this assay, and inadequate number of viral copies(<138 copies/mL). A negative result must be combined with clinical observations, patient history, and epidemiological information. The expected result is Negative.  Fact Sheet for Patients:  EntrepreneurPulse.com.au  Fact Sheet for Healthcare Providers:  IncredibleEmployment.be  This test is no t yet approved or cleared by the [place] FDA and  has been authorized for detection and/or diagnosis of SARS-CoV-2 by FDA under an Emergency Use Authorization (EUA). This EUA will remain  in effect (meaning  this test can be used) for the duration of the COVID-19 declaration under Section 564(b)(1) of the Act, 21 U.S.C.section 360bbb-3(b)(1), unless the authorization is terminated  or revoked sooner.       Influenza A by PCR NEGATIVE NEGATIVE Final   Influenza B by PCR NEGATIVE NEGATIVE Final    Comment: (NOTE) The Xpert Xpress SARS-CoV-2/FLU/RSV plus assay is intended as an aid in the diagnosis of influenza from Nasopharyngeal swab specimens and should not be used  as a sole basis for treatment. Nasal washings and aspirates are unacceptable for Xpert Xpress SARS-CoV-2/FLU/RSV testing.  Fact Sheet for Patients: EntrepreneurPulse.com.au  Fact Sheet for Healthcare Providers: IncredibleEmployment.be  This test is not yet approved or cleared by the [place] FDA and has been authorized for detection and/or diagnosis of SARS-CoV-2 by FDA under an Emergency Use Authorization (EUA). This EUA will remain in effect (meaning this test can be used) for the duration of the COVID-19 declaration under Section 564(b)(1) of the Act, 21 U.S.C. section 360bbb-3(b)(1), unless the authorization is terminated or revoked.  Performed at [facility], [place] [address], [place], [place] [address]   C Difficile Quick Screen w PCR reflex     Status: Abnormal   Collection Time: 05/19/20  5:47 PM   Specimen: STOOL  Result Value Ref Range Status   C Diff antigen POSITIVE (A) NEGATIVE Final   C Diff toxin NEGATIVE NEGATIVE Final   C Diff interpretation Results are indeterminate. See PCR results.  Final    Comment: Performed at [facility], [place] [address] [place], [place] [address]  Gastrointestinal Panel by PCR , Stool     Status: None   Collection Time: 05/19/20  5:47 PM   Specimen: STOOL  Result Value Ref Range Status   Campylobacter species NOT DETECTED NOT DETECTED Final   Plesimonas shigelloides NOT DETECTED NOT DETECTED Final   Salmonella species  NOT DETECTED NOT DETECTED Final   Yersinia enterocolitica NOT DETECTED NOT DETECTED Final   Vibrio species NOT DETECTED NOT DETECTED Final   Vibrio cholerae NOT DETECTED NOT DETECTED Final   Enteroaggregative E coli (EAEC) NOT DETECTED NOT DETECTED Final   Enteropathogenic E coli (EPEC) NOT DETECTED NOT DETECTED Final   Enterotoxigenic E coli (ETEC) NOT DETECTED NOT DETECTED Final   Shiga like toxin producing E coli (STEC) NOT DETECTED NOT DETECTED Final   Shigella/Enteroinvasive E coli (EIEC) NOT DETECTED NOT DETECTED Final   Cryptosporidium NOT DETECTED NOT DETECTED Final   Cyclospora cayetanensis NOT DETECTED NOT DETECTED Final   Entamoeba histolytica NOT DETECTED NOT DETECTED Final   Giardia lamblia NOT DETECTED NOT DETECTED Final   Adenovirus F40/41 NOT DETECTED NOT DETECTED Final   Astrovirus NOT DETECTED NOT DETECTED Final   Norovirus GI/GII NOT DETECTED NOT DETECTED Final   Rotavirus A NOT DETECTED NOT DETECTED Final   Sapovirus (I, II, IV, and V) NOT DETECTED NOT DETECTED Final    Comment: Performed at [facility], [place] [place], [place] [address]  C. Diff by PCR, Reflexed     Status: None   Collection Time: 05/19/20  5:47 PM  Result Value Ref Range Status   Toxigenic C. Difficile by PCR NEGATIVE NEGATIVE Final    Comment: Patient is colonized with non toxigenic C. difficile. May not need treatment unless significant symptoms are present. Performed at [facility], [place] [address] [place], [place] [address]   Culture, blood (routine x 2)     Status: None (Preliminary result)   Collection Time: 05/24/20  8:20 PM   Specimen: BLOOD LEFT FOREARM  Result Value Ref Range Status   Specimen Description BLOOD LEFT FOREARM  Final   Special Requests   Final    BOTTLES DRAWN AEROBIC AND ANAEROBIC Blood Culture results may not be optimal due to an inadequate volume of blood received in culture bottles   Culture   Final    NO GROWTH 2 DAYS Performed at [facility], [place] [address] [place], [place] [address]    Report Status PENDING  Incomplete  Culture, blood (routine x 2)     Status: None (Preliminary result)   Collection Time: 05/24/20  8:25 PM   Specimen: BLOOD LEFT HAND  Result Value Ref Range Status   Specimen Description BLOOD LEFT HAND  Final   Special Requests   Final    BOTTLES DRAWN AEROBIC ONLY Blood Culture results may not be optimal due to an inadequate volume of blood received in culture bottles   Culture   Final    NO GROWTH 2 DAYS Performed at [facility], [place] [address] [place], [place] [address]    Report Status PENDING  Incomplete         Radiology Studies: No results found.    Scheduled Meds: . calcium acetate  1,334 mg Oral TID WC  . cholestyramine light  4 g Oral QID  . cinacalcet  30 mg Oral Q supper  . clopidogrel  75 mg Oral Daily  . diphenoxylate-atropine  5 mL Per Tube QID  . doxercalciferol  2 mcg Intravenous Q T,Th,Sa-HD  . feeding supplement (PROSource TF)  45 mL Per Tube BID  . folic acid  1 mg Per Tube Daily  . loperamide HCl  2 mg Per Tube QID  . metoprolol tartrate  12.5 mg Per Tube BID  . multivitamin  1 tablet Per Tube QHS  . pantoprazole sodium  40 mg Per Tube Daily  . thiamine injection  100 mg Intravenous Daily   Continuous Infusions: . sodium chloride Stopped (05/24/20 0228)  . feeding supplement (OSMOLITE 1.5 CAL) 1,000 mL (05/25/20 2004)  . sodium chloride Stopped (05/23/20 0537)     LOS: 6 days      Debbe Odea, MD [facility] Pager: [email or web address] 05/26/2020, 11:02 AM

## 2020-05-26 NOTE — Progress Notes (Signed)
Nutrition Follow-up  DOCUMENTATION CODES:   Underweight,Severe malnutrition in context of chronic illness  INTERVENTION:   -Continue renal MVI daily -Continue Magic cup TID with meals, each supplement provides 290 kcal and 9 grams of protein -Continue Osmolite 1.5@ 54m/hr via cortrak tube   450mProsource TF BID.   Tube feeding regimen provides1880kcal (100% of needs),86grams of protein, and 91215mf H2O.   NUTRITION DIAGNOSIS:   Severe Malnutrition related to chronic illness (ESRD on HD) as evidenced by severe fat depletion,severe muscle depletion.  Ongoing  GOAL:   Patient will meet greater than or equal to 90% of their needs  Met with TF  MONITOR:   PO intake,Supplement acceptance,Labs,Weight trends,TF tolerance,Skin,I & O's  REASON FOR ASSESSMENT:   Consult Assessment of nutrition requirement/status  ASSESSMENT:   Karen Morrow a 59 69o. female with medical history significant of ESRD, HTN, anemia of chronic disease, PAD on Plavix, hx of Upper gi bleed, chronic diastolic CHF, asthma, atrial fibrillation not on anticoagulation secondary to recurrent bleeding, GERD, hepatitis C antibody positive, aortic insufficiency, history of pulmonary hypertension due to left ventricular diastolic dysfunction, tobacco abuse  12/22- s/p EGD- revealed benign- appearing esophageal stenosis, gastritis (biopsied), duodenitis (biopsied), non-bleeding duodenal diverticulum 12/24- cortrak placed (tip of tube on stomach), TF initiated  Reviewed I/O's: +300 ml x 24 hours and +4.8 L since admission  Pt resting quietly in bed at time of visit. She did not respond to voice. No family present at time of visit.   Goals of care discussions ongoing. Per hospitalist note, plan to monitor PO intake for a week with cortrak tube. Pt husband understanding that pt may require PEG and SNF placement if he still desires full scope care.   Medications reviewed and include phoslo and  folic acid.   Labs reviewed: K and Mg WDL. Phos: 1.5 (05/24/20). CBGS: 95-105.   Diet Order:   Diet Order            Diet renal with fluid restriction Fluid restriction: 1200 mL Fluid; Room service appropriate? Yes; Fluid consistency: Thin  Diet effective now                 EDUCATION NEEDS:   No education needs have been identified at this time  Skin:  Skin Assessment: Skin Integrity Issues: Skin Integrity Issues:: Other (Comment),Stage I Stage I: rt ischial tuberosity, rt lateral lumbar Other: pressure injury rt upper abdomen  Last BM:  05/26/20 (via rectal tube)  Height:   Ht Readings from Last 1 Encounters:  05/19/20 '5\' 7"'  (1.702 m)    Weight:   Wt Readings from Last 1 Encounters:  05/26/20 52 kg    Ideal Body Weight:     BMI:  Body mass index is 17.96 kg/m.  Estimated Nutritional Needs:   Kcal:  1800-2000  Protein:  90-105 grams  Fluid:  1000 ml + UOP    JenLoistine ChanceD, LDN, CDCDalegistered Dietitian II Certified Diabetes Care and Education Specialist Please refer to [facility] RD and/or RD on-call/weekend/after hours pager

## 2020-05-26 NOTE — Care Management Important Message (Signed)
Important Message  Patient Details  Name: CHARENE MCCALLISTER MRN: [id number] Date of Birth: 1960/11/07   Medicare Important Message Given:  Yes     Shelda Altes 05/26/2020, 9:32 AM

## 2020-05-26 NOTE — Progress Notes (Signed)
Patient ID: Karen Morrow, female   DOB: [birth year], 59 y.o.   MRN: [id number]  [facility] Progress Note   Assessment/ Plan:   1.  Recurrent GI bleed with ABL anemia: With past history of gastric ulcers-spotty adherence with PPI/antacids at home.  EGD done on 12/22 showed gastritis/duodenitis without obvious ulcer. Hemoglobin and hematocrit remained stable-rise likely from intravascular volume contraction from poor oral intake/insensible losses. 2. ESRD: did not have dialysis for about a week leading up to her hospitalization.  HD TTS schedule. She underwent HD on 12/23 and 12/26 here. We are concerned that she is actively dying from cachexia. 3.  Hyponatremia: This is likely from hypotonic fluids/free water intake in the setting of ESRD.  Improved.   4. CKD-MBD: Phosphorus level low-binders discontinued earlier.  Continue Hectorol for PTH suppression; corrected calcium level is 9.1 and I will continue Sensipar at this time. 5.  Protein calorie malnutrition/failure to thrive: With significant reduction of appetite/weight loss with ongoing GI issues; ongoing tube feeds at this time. 6.  Atrial fibrillation with rapid ventricular response: At this time, she is rate controlled but in atrial fibrillation. Continue maintenance IV fluids for volume support.  Kelly Splinter, MD 05/26/2020, 2:12 PM    Subjective:   Seen in room. "I'm doing better".  Able to make short conversations, does not know orientation answers.    Objective:   BP 98/65   Pulse (!) 111   Temp 99.6 F (37.6 C)   Resp 19   Ht 5\' 7"  (1.702 m)   Wt 52 kg   SpO2 100%   BMI 17.96 kg/m   Physical Exam: Gen: Comfortably resting in bed-lethargic to conversation. CVS: Pulse irregularly irregular tachycardia, S1 and S2 without murmur Resp: Clear to auscultation, no distinct rales or rhonchi Abd: Soft, scaphoid, nontender Ext: No lower extremity edema.  Dressings over left upper arm AVF. Neuro: Ox2, nonfocal,  gen weakness  Assessment/ Plan:   Dialysis Orders:[facility] T,Th,S 3:45 hr 180NRe 350/600 42 kg 3.0K/2.25 Ca AVF -Mircera 50 mcg IV q 2 weeks -Hectorol 2 mcg IV q 2 weeks -Venofer 50 mg IV weekly  Labs: BMET Recent Labs  Lab 05/19/20 1655 05/19/20 1748 05/20/20 0400 05/20/20 0408 05/21/20 0310 05/21/20 1248 05/22/20 0909 05/22/20 1543 05/23/20 0105 05/23/20 1233 05/23/20 2214 05/24/20 0226 05/24/20 1651 05/25/20 0337 05/26/20 0203  NA  --   --  140   < > 135   < > 133*   < > 135 133* 129* 127* 128* 126* 131*  K  --   --  2.4*   < > 2.3*   < > 2.4*   < > 2.7* 2.6* 3.6 3.6 4.2 4.1 3.9  CL   < >  --  103  --  103   < > 102   < > 102 101 97* 97* 101 99 100  CO2   < >  --  18*  --  20*   < > 19*   < > 22 22 22  21* 19* 19* 21*  GLUCOSE   < >  --  66*  --  86   < > 98   < > 112* 93 114* 129* 94 100* 96  BUN   < >  --  36*  --  10   < > 13   < > 5* 5* 7 9 14 19 15   CREATININE   < >  --  10.97*  --  4.66*   < >  5.58*   < > 3.07* 3.43* 3.69* 3.86* 4.32* 4.65* 3.06*  CALCIUM   < >  --  8.3*  --  7.7*   < > 7.6*   < > 7.5* 7.7* 7.9* 7.9* 7.7* 7.4* 7.3*  PHOS  --  7.2* 6.5*  --  2.1*  --  2.5  --   --   --   --  1.5*  --   --   --    < > = values in this interval not displayed.   CBC Recent Labs  Lab 05/19/20 1655 05/20/20 0400 05/22/20 0903 05/22/20 2118 05/23/20 0105 05/25/20 0337 05/25/20 1639  WBC 8.1   < > 7.8  --  8.6 8.8 6.4  NEUTROABS 6.3  --   --   --   --   --   --   HGB 7.6*   < > 11.5* 12.2 12.8 10.2* 8.8*  HCT 22.8*   < > 34.2* 36.5 36.2 30.8* 25.4*  MCV 83.2   < > 82.6  --  82.1 83.9 82.7  PLT 87*   < > 59*  --  58* 61* 63*   < > = values in this interval not displayed.     Medications:    . calcium acetate  1,334 mg Oral TID WC  . cholestyramine light  4 g Oral QID  . cinacalcet  30 mg Oral Q supper  . clopidogrel  75 mg Oral Daily  . diphenoxylate-atropine  5 mL Per Tube QID  . doxercalciferol  2 mcg Intravenous Q T,Th,Sa-HD  . feeding supplement  (PROSource TF)  45 mL Per Tube BID  . folic acid  1 mg Per Tube Daily  . metoprolol tartrate  12.5 mg Per Tube BID  . multivitamin  1 tablet Per Tube QHS  . pantoprazole sodium  40 mg Per Tube Daily

## 2020-05-27 DIAGNOSIS — I5032 Chronic diastolic (congestive) heart failure: Secondary | ICD-10-CM | POA: Diagnosis not present

## 2020-05-27 DIAGNOSIS — E876 Hypokalemia: Secondary | ICD-10-CM | POA: Diagnosis not present

## 2020-05-27 DIAGNOSIS — K219 Gastro-esophageal reflux disease without esophagitis: Secondary | ICD-10-CM | POA: Diagnosis not present

## 2020-05-27 DIAGNOSIS — N186 End stage renal disease: Secondary | ICD-10-CM | POA: Diagnosis not present

## 2020-05-27 LAB — RENAL FUNCTION PANEL
Albumin: 1.9 g/dL — ABNORMAL LOW (ref 3.5–5.0)
Anion gap: 10 (ref 5–15)
BUN: 34 mg/dL — ABNORMAL HIGH (ref 6–20)
CO2: 22 mmol/L (ref 22–32)
Calcium: 7.4 mg/dL — ABNORMAL LOW (ref 8.9–10.3)
Chloride: 99 mmol/L (ref 98–111)
Creatinine, Ser: 4.02 mg/dL — ABNORMAL HIGH (ref 0.44–1.00)
GFR, Estimated: 12 mL/min — ABNORMAL LOW (ref >60)
Glucose, Bld: 86 mg/dL (ref 70–99)
Phosphorus: 2.4 mg/dL — ABNORMAL LOW (ref 2.5–4.6)
Potassium: 4.5 mmol/L (ref 3.5–5.1)
Sodium: 131 mmol/L — ABNORMAL LOW (ref 135–145)

## 2020-05-27 LAB — GLUCOSE, CAPILLARY
Glucose-Capillary: 99 mg/dL (ref 70–99)
Glucose-Capillary: 87 mg/dL (ref 70–99)
Glucose-Capillary: 92 mg/dL (ref 70–99)
Glucose-Capillary: 83 mg/dL (ref 70–99)
Glucose-Capillary: 87 mg/dL (ref 70–99)

## 2020-05-27 LAB — SURGICAL PATHOLOGY

## 2020-05-27 LAB — CBC
HCT: 32.8 % — ABNORMAL LOW (ref 36.0–46.0)
Hemoglobin: 10.9 g/dL — ABNORMAL LOW (ref 12.0–15.0)
MCH: 27.9 pg (ref 26.0–34.0)
MCHC: 33.2 g/dL (ref 30.0–36.0)
MCV: 83.9 fL (ref 80.0–100.0)
Platelets: 115 10*3/uL — ABNORMAL LOW (ref 150–400)
RBC: 3.91 MIL/uL (ref 3.87–5.11)
RDW: 18.6 % — ABNORMAL HIGH (ref 11.5–15.5)
WBC: 6.1 10*3/uL (ref 4.0–10.5)
nRBC: 0 % (ref 0.0–0.2)

## 2020-05-27 MED ORDER — CALCIUM ACETATE (PHOS BINDER) 667 MG/5ML PO SOLN
1334.0000 mg | Freq: Three times a day (TID) | ORAL | Status: DC
  Administered 2020-05-27: 1334 mg
  Filled 2020-05-27 (×3): qty 10

## 2020-05-27 NOTE — Progress Notes (Signed)
Daily Rounding Note  05/27/2020, 12:52 PM  LOS: 7 days    Patient is unassigned for GI has been seen by all GI groups and multiple providers in [place].  See initial consult for this admission of 05/20/2020.  HPI: Karen Morrow is a 59 y.o. female.  PMH ESRD.  HD TTS. Hypertension.  Anemia of chronic disease.  PVD, chronic Plavix.  Diastolic CHF.  Atrial fibrillation, not on AC due to recurrent bleeding.  Aortic insufficiency.  Pulmonary hypertension.  Asthma.  GERD.  Hepatitis C.  Has required periodic blood transfusions dating back several years  09/2018 EGD.  Mild gastritis.  Cratered 3 to 4 mm, nonbleeding gastric ulcer. Nonbleeding duodenal diverticulum. 07/2019 EGD.  For melena, hematemesis, anemia.  4 cm HH, hemorrhagic gastritis, 15 mm cratered gastric ulcer with "dirty" appearing base but no definite VV.  Was not taking ulcer medication. Pathology from these procedures negative for H. Pylori, dysplasi, neoplasm.  09/2019 EGD.  For epigastric pain, hematemesis, melena.  Benign, moderate stenosis at GE junction traversable with scope.  Solitary nonbleeding, cratered gastric ulcer with pigmented material at base in gastric antrum.  Difficult to visualize due to marked antral edema and nodularity.  Lesion 12 to 15 mm at largest diameter, friable edges.  Diffuse congested mucosa throughout the remainder of the stomach..  Congested, moderate inflammation and erythema in duodenal bulb.Pathology from ulcer revealed healing erosion/ulcer.  Stains negative for H. pylori.  No malignancy 05/21/2020 EGD revealed gastritis but no identifiable ulcer, duodenitis.  Nonbleeding duodenal diverticulum initially mistaken as a deep gastric ulcer.  Inflammation and edema causing distortion of landmarks.  ?  If the inflammation is related to malnutrition/low protein,?  Menetrier's disease?Marland Kitchen  No obvious portal hypertension though could possibly be  present.  Could be losing blood from gastritis.  Pathology: Nonspecific reactive gastropathy.  No H. pylori.  Chronic duodenitis.   Patient has never had a colonoscopy, declined these in past 05/23/2020 CTAP w contrast.  Large volume of abdominal pelvic ascites.  Left greater than right small pleural effusions.  Nonspecific rugal fold thickening, could reflect gastritis.  No bowel obstruction, no ileus.  Aortic atherosclerosis.  Renal atrophy c/w ESRD. Received 2 PRBCs 12/20 -12/21.  Ferric citrate now discontinued.  Folic acid for the last several days Imodium for previous 3 days,  1-3 times daily.  Discontinued yesterday Protonix 40 IV bid switched to 40 po daily 12/27  Chronic Plavix, resumed 12/26.     SUBJECTIVE:   Chief complaint: Refractory diarrhea.  05/19/2020 C. difficile antigen positive, C. difficile toxin negative. 05/19/2020 stool PCR panel entirely negative. Has not received anti-biotics including vancomycin.   Antidiarrheals include: Questran 4 times daily added 12/25 but has not gotten all doses. Lomotil ordered 4 times daily but received 3-4 doses daily in the past few days. Imodium 1 to 3 x day, stopped on 12/27.    Stool is watery, brown, not bloody.  Flexi-Seal in place since at least 12/21.  Initial output clocked at 1.4 L/day but most recent measured output was 400 mL recorded on 12/25.  No output recorded for 12/26, 12/27.  Currently on 12/28 there is 500 mL of liquid brown stool in flexi-seal.   Pt will not eat or take po meds, TF of Osmolite 1.5 cal runniing at 50 mL/hour.   HD cancelled today d/t hypotension.  Per Dr. Jonnie Finner, renal will reassess tomorrow but says "if not improving soon, transition to comfort  care"       OBJECTIVE:         Vital signs in last 24 hours:    Temp:  [99.3 F (37.4 C)-99.8 F (37.7 C)] 99.5 F (37.5 C) (12/28 0400) Pulse Rate:  [94-103] 100 (12/28 0400) Resp:  [20] 20 (12/28 0400) BP: (79-91)/(59-72) 79/59 (12/28  0400) SpO2:  [95 %-98 %] 98 % (12/28 0400) Weight:  [53.1 kg] 53.1 kg (12/28 0555) Last BM Date: 05/26/20 Filed Weights   05/25/20 1903 05/26/20 0350 05/27/20 0555  Weight: 50 kg 52 kg 53.1 kg   General: Patient is acutely and chronically ill, somewhat edematous, malnourished appearing.  Resting comfortably in bed but has little to say Heart: Irregularly irregular, rate controlled Chest: No labored breathing or cough. Abdomen: Soft, nontender, nondistended.  No masses, HSM, bruits, hernias Rectal: 500 mL of watery brown stool present in the Flexi-Seal bag Extremities: Edema in the upper and lower extremities. Neuro/Psych: Laconic.  Has very little to say.  Intermittently following commands.  Seems very withdrawn.  RN tells me that she was quite verbal earlier.  Intake/Output from previous day: No intake/output data recorded.  Intake/Output this shift: No intake/output data recorded.  Lab Results: Recent Labs    05/25/20 0337 05/25/20 1639 05/27/20 1059  WBC 8.8 6.4 6.1  HGB 10.2* 8.8* 10.9*  HCT 30.8* 25.4* 32.8*  PLT 61* 63* 115*   BMET Recent Labs    05/25/20 0337 05/26/20 0203 05/27/20 1059  NA 126* 131* 131*  K 4.1 3.9 4.5  CL 99 100 99  CO2 19* 21* 22  GLUCOSE 100* 96 86  BUN 19 15 34*  CREATININE 4.65* 3.06* 4.02*  CALCIUM 7.4* 7.3* 7.4*   LFT Recent Labs    05/27/20 1059  ALBUMIN 1.9*   PT/INR No results for input(s): LABPROT, INR in the last 72 hours. Hepatitis Panel No results for input(s): HEPBSAG, HCVAB, HEPAIGM, HEPBIGM in the last 72 hours.  Studies/Results: No results found.  ASSESMENT:   *    Refractory diarrhea. Has been felt secondary to hypoalbuminemia and poor absorption. C. difficile antigen positive, C. difficile toxin negative.  Stool pathogen panel negative.  No evidence for celiac disease on recent duodenal biopsies. Previous rectal bleeding now resolved.  *   Failure to thrive, protein calorie malnutrition, poor p.o.  intake. 05/23/2020 core track feeding tube placed.  *     ESRD.  Hypotension is interfering with ability to perform hemodialysis.  *    Confusion, possible early dementia.  *    Normocytic anemia.  *    Thrombocytopenia.  Hepatitis C positive, high viral quant in 08/2018.  No known or documented hep C treatment.  No liver abnormalities on CTAP of 12/24.  *    Coagulopathy.  INR 1.7 on 12/20.  *    Atrial fibrillation.  Is not on anticoagulation as in or outpatient.     PLAN   *   Colonoscopy?? .  Biopsy for microscopic colitis, assess for infectious/C. difficile colitis.  Would not be difficult to prep the patient given the level of diarrhea as well as the fact she has feeding tube in place for administration of bowel prep.  Would need to discuss procedure with her husband however as the patient is not able to make medical decisions at this point. Empiric Vancomycin?     Azucena Freed  05/27/2020, 12:52 PM Phone [phone]

## 2020-05-27 NOTE — Progress Notes (Signed)
Pt does not c/o any pain rather she states she wants to go home. Pt's needs are met and safety measures are maintained. Pt did not have her HD treatment due to being hypotensive. She will have her HD treatment tomorrow.

## 2020-05-27 NOTE — Progress Notes (Signed)
Occupational Therapy Evaluation Patient Details Name: Karen Morrow MRN: [id number] DOB: 19-Feb-1961 Today's Date: 05/27/2020    History of Present Illness 59yo female presenting with 1 week of GIB, missed HD sessions due to her bleeding. PMH A-fib, CHF, CKD, hep C, HTN   Clinical Impression   Pt presents with above diagnosis. PTA pt PLOF living at home with husband, reports I with ADLs and IADLs, with no use of AE.  Pt currently limited with ADL function due to fatigue, lethargy, weakness, and decreased activity tolerance. Pt will benefit from continued acute OT to address established deficits to maximize engagement and independence with ADLs prior to dc setting. DC recommendation to SNF.    Follow Up Recommendations  SNF    Equipment Recommendations       Recommendations for Other Services       Precautions / Restrictions Precautions Precautions: Fall;Other (comment) Precaution Comments: cortrak, rectal tube      Mobility Bed Mobility Overal bed mobility: Needs Assistance Bed Mobility: Supine to Sit;Sit to Supine           General bed mobility comments: refused performing rolling in bed for bed mobility, currently positioned lying on R side with pillows for support.    Transfers                 General transfer comment: deferred- lethargy    Balance Overall balance assessment: Needs assistance Sitting-balance support: Bilateral upper extremity supported;Feet supported Sitting balance-Leahy Scale: Zero Sitting balance - Comments: immediately loses balance and falls to her left when external support is removed                                   ADL either performed or assessed with clinical judgement   ADL Overall ADL's : Needs assistance/impaired Eating/Feeding: NPO Eating/Feeding Details (indicate cue type and reason): feeding tube                         Toileting- Clothing Manipulation and Hygiene: Total assistance;Bed  level Toileting - Clothing Manipulation Details (indicate cue type and reason): incontinent       General ADL Comments: Session limited due to pt level of arousal and lethargy. Declined mobility this session. Per chart review,pt HD scheduled for TTS.     Vision         Perception     Praxis      Pertinent Vitals/Pain Pain Assessment: Faces Faces Pain Scale: No hurt Pain Intervention(s): Limited activity within patient's tolerance     Hand Dominance     Extremity/Trunk Assessment Upper Extremity Assessment Upper Extremity Assessment: Generalized weakness   Lower Extremity Assessment Lower Extremity Assessment: Defer to PT evaluation   Cervical / Trunk Assessment Cervical / Trunk Assessment: Kyphotic   Communication Communication Communication: No difficulties   Cognition Arousal/Alertness: Lethargic Behavior During Therapy: Flat affect Overall Cognitive Status: Difficult to assess                                 General Comments: very diffiicult to assess cognition at eval due to lethargy   General Comments  BP 103/77 HR 102 in supine    Exercises     Shoulder Instructions      Home Living Family/patient expects to be discharged to:: Private residence Living Arrangements: Spouse/significant other Available Help  at Discharge: Family Type of Home: Apartment Home Access: Level entry     Home Layout: One level               Home Equipment: None   Additional Comments: husband works in Paramedic at [employer]      Prior Functioning/Environment Level of Independence: Independent        Comments: patient too lethargic to provide full hx, however, reports being I with ADLs and no use of AE.        OT Problem List: Decreased strength;Decreased activity tolerance;Impaired balance (sitting and/or standing)      OT Treatment/Interventions: Self-care/ADL training;Therapeutic exercise;Therapeutic activities;Patient/family  education;Balance training    OT Goals(Current goals can be found in the care plan section) Acute Rehab OT Goals Patient Stated Goal: no goal stated, too lethargic OT Goal Formulation: Patient unable to participate in goal setting Time For Goal Achievement: 06/10/20 Potential to Achieve Goals: Poor  OT Frequency: Min 2X/week   Barriers to D/C:            Co-evaluation              AM-PAC OT "6 Clicks" Daily Activity     Outcome Measure Help from another person eating meals?: Total Help from another person taking care of personal grooming?: A Lot Help from another person toileting, which includes using toliet, bedpan, or urinal?: Total Help from another person bathing (including washing, rinsing, drying)?: Total Help from another person to put on and taking off regular upper body clothing?: A Lot Help from another person to put on and taking off regular lower body clothing?: A Lot 6 Click Score: 9   End of Session Equipment Utilized During Treatment:  (Feeding tube) Nurse Communication: Other (comment)  Activity Tolerance: Patient limited by lethargy Patient left: in bed;with bed alarm set;with call bell/phone within reach  OT Visit Diagnosis: Muscle weakness (generalized) (M62.81);Feeding difficulties (R63.3)                Time: 0312-8118 OT Time Calculation (min): 12 min Charges:  OT General Charges $OT Visit: 1 Visit OT Evaluation $OT Eval Low Complexity: [place], MSOT, OTR/L  Supplemental Rehabilitation Services  [phone]   Marius Ditch 05/27/2020, 1:47 PM

## 2020-05-27 NOTE — Progress Notes (Addendum)
PROGRESS NOTE    Karen Morrow   VWP:[id number]  DOB: 28-Jun-1960  DOA: 05/19/2020 PCP: [name], MD   Brief Narrative:  Karen Morrow is a 59 y.o. F with ESRD on HD TThS, Afib not on AC due to rec GIB, HTN, dCHF, and hep C, PAD on Plavix who presented with 1 week rectal bleeding for which she was using sanitary pads.   Over the last week, patient has been using pads because of "leaking blood in stool".  In the ER was hypotensive to 89/64 and hypogylcemic to 50s and so was started on pressors and given glucose.  Hgb was 7.6 and she was given 2u PRBCs.   GI was consulted for the GI bleed.   Subjective: Sleepy but awakens. Has no complaints.     Assessment & Plan:   Active Problems: GI bleeding  - of note, no blood in stool actually noted in the hospital- per ED doctor, patient had "black green stool in her diaper"- still was possibly this color due to being on Ferric Citrate.  - she has h/o of PUD with a gastric ulcer noted on EGD in 09/2018, 3/21, 5/21 - s/p EGD, no ulcer found but she does have gastritis- surgical path negative for malignancy - as of now, no longer has blood in stool but has ongoing diarrhea which the patient's husband states was present prior to the bloody stools- she has no complaints of abdominal pain or nausea    Watery Diarrhea-  -despite essentially being NPO due to poor oral intake, she still has diarrhea and is requiring a rectal tube- patient still not aware she is having diarrhea and thinks the rectal tube is something that is being used for dialysis - follow I and O as closely as possible - C diff antigen + but toxin neg-- not treating as C diff at this time- per GI, ok to use anti motility agents  - 12/24> cor trac placed and CT of the abdomen/pelvis done with contrast-unrevealing for cause for her diarrhea-noted to have ascites and small pleural effusions - I have spoken with Dr Lyndel Safe today who will evaluate her again  - continue  Lomotil QID and cholestyramine-   FTT- hypoalbuminemia/ severe protein calorie malnutrition with third spacing (ascites and pleural effusions) - the patient has had significant weight loss and now has significant weakness  - I feel that the diarrhea is playing a large part in this and has to do with her loss of appetite - palliative care discussions being had -Feeding tube was placed on 12/24 and tube feeds were started-she is tolerating these well but not eating  - I am continuing to have discussions about long term goals with her husband- the patient has a poor understanding of her condition and I believe she may also have early dementia- she is not a reliable to make decisions for herself and I am allowing complicated decisions to be made by her husband  ESRD - appreciate f/u by renal team-  - nephrology unable to dialyze her today due to hypotension- plan to give fluids and albumin today  Hypokalemia  - likely due to diarrhea and poor oral intake - improved after diarrhea slowed down  Hyponatremia -The patient is third spacing fluids into her abdomen- sodium better after dialysis   Hypomagnesemia -  replaced  Anemia - ? due to GI blood loss vs due to anemia of chronic disease - resolved after 2 u PRBC - Hb 7.6 on  admission and is now ~ 10  Hypoglycemia - likely due to poor oral intake-CBG has been in the 50s on a few occasions but has improved after tube feeds started  A-fib, paroxysmal - not on anticoagulation due to prior GI bleeding - Metoprolol on hold due to low BPs - developed RVR overnight - given IVF boluses and IV anAmio bolus-  -Have resumed metoprolol cautiously and watching BP closely-HR now in 90s    Acute Thrombocytopenia  - Plt have been < 100 since admission- last noted to be 193 on 09/2019 (in [place])- no prior history of low platelets  - not receiving any heparin products- is improving  PAD - resumed Plavix 12/27    Hepatic cirrhosis ? - noted on chart-  fatty liver was noted on abdominal ultrasound on 07/2019 but not mentioned on prior CT report - no mention of cirrhosis at this time  Dementia?  - poor historian, forgetting to take meds at home, forgetful of recent events - follow    Time spent in minutes: 35 min- extensive conversation with GI and Nephrology today DVT prophylaxis: SCDs Start: 05/20/20 0116 Place and maintain sequential compression device Start: 05/19/20 2226  Code Status: DNR Family Communication: with husband Disposition Plan:  Status is: Inpatient  Remains inpatient appropriate because:IV treatments appropriate due to intensity of illness or inability to take PO   Dispo: The patient is from: Home              Anticipated d/c is to: SNF vs comfort care and hospice              Anticipated d/c date is: 2 days              Patient currently is not medically stable to d/c.   Consultants:   GI  Nephrology  Palliative care Procedures:   EGD Antimicrobials:  Anti-infectives (From admission, onward)   None       Objective: Vitals:   05/26/20 2039 05/27/20 0000 05/27/20 0400 05/27/20 0555  BP: 91/69 (!) 88/72 (!) 79/59   Pulse: (!) 103 94 100   Resp: 20 20 20    Temp: 99.8 F (37.7 C) 99.8 F (37.7 C) 99.5 F (37.5 C)   TempSrc: Oral Oral Oral   SpO2: 95% 97% 98%   Weight:    53.1 kg  Height:       No intake or output data in the 24 hours ending 05/27/20 1218 Filed Weights   05/25/20 1903 05/26/20 0350 05/27/20 0555  Weight: 50 kg 52 kg 53.1 kg    Examination: General exam: Appears comfortable  HEENT: PERRLA, oral mucosa moist, no sclera icterus or thrush- cor trac present Respiratory system: Clear to auscultation. Respiratory effort normal. Cardiovascular system: S1 & S2 heard,  No murmurs  Gastrointestinal system: Abdomen soft, non-tender, mildly distended. Normal bowel sounds   Central nervous system: Alert and oriented to person/ place. No focal neurological deficits. Extremities:  No cyanosis, clubbing or edema Skin: No rashes or ulcers Psychiatry:  flat affect  Data Reviewed: I have personally reviewed following labs and imaging studies  CBC: Recent Labs  Lab 05/22/20 0903 05/22/20 2118 05/23/20 0105 05/25/20 0337 05/25/20 1639 05/27/20 1059  WBC 7.8  --  8.6 8.8 6.4 6.1  HGB 11.5* 12.2 12.8 10.2* 8.8* 10.9*  HCT 34.2* 36.5 36.2 30.8* 25.4* 32.8*  MCV 82.6  --  82.1 83.9 82.7 83.9  PLT 59*  --  58* 61* 63* 115*   Basic  Metabolic Panel: Recent Labs  Lab 05/21/20 0310 05/21/20 1248 05/22/20 0909 05/22/20 1543 05/23/20 1233 05/23/20 2214 05/24/20 0226 05/24/20 1651 05/25/20 0337 05/26/20 0203 05/27/20 1059  NA 135   < > 133*   < > 133* 129* 127* 128* 126* 131* 131*  K 2.3*   < > 2.4*   < > 2.6* 3.6 3.6 4.2 4.1 3.9 4.5  CL 103   < > 102   < > 101 97* 97* 101 99 100 99  CO2 20*   < > 19*   < > 22 22 21* 19* 19* 21* 22  GLUCOSE 86   < > 98   < > 93 114* 129* 94 100* 96 86  BUN 10   < > 13   < > 5* 7 9 14 19 15  34*  CREATININE 4.66*   < > 5.58*   < > 3.43* 3.69* 3.86* 4.32* 4.65* 3.06* 4.02*  CALCIUM 7.7*   < > 7.6*   < > 7.7* 7.9* 7.9* 7.7* 7.4* 7.3* 7.4*  MG 1.7  --   --   --  1.5* 1.5* 2.2 1.9  --   --   --   PHOS 2.1*  --  2.5  --   --   --  1.5*  --   --   --  2.4*   < > = values in this interval not displayed.   GFR: Estimated Creatinine Clearance: 12.5 mL/min (A) (by C-G formula based on SCr of 4.02 mg/dL (H)). Liver Function Tests: Recent Labs  Lab 05/21/20 2029 05/22/20 0909 05/22/20 1543 05/24/20 0226 05/27/20 1059  AST 25  --  19  --   --   ALT 14  --  12  --   --   ALKPHOS 61  --  61  --   --   BILITOT 0.8  --  1.2  --   --   PROT 4.8*  --  4.9*  --   --   ALBUMIN 1.9* 1.8* 2.0* 2.6* 1.9*   No results for input(s): LIPASE, AMYLASE in the last 168 hours. No results for input(s): AMMONIA in the last 168 hours. Coagulation Profile: No results for input(s): INR, PROTIME in the last 168 hours. Cardiac Enzymes: No results  for input(s): CKTOTAL, CKMB, CKMBINDEX, TROPONINI in the last 168 hours. BNP (last 3 results) No results for input(s): PROBNP in the last 8760 hours. HbA1C: No results for input(s): HGBA1C in the last 72 hours. CBG: Recent Labs  Lab 05/26/20 1602 05/26/20 2040 05/27/20 0123 05/27/20 0525 05/27/20 1143  GLUCAP 95 96 99 87 92   Lipid Profile: No results for input(s): CHOL, HDL, LDLCALC, TRIG, CHOLHDL, LDLDIRECT in the last 72 hours. Thyroid Function Tests: No results for input(s): TSH, T4TOTAL, FREET4, T3FREE, THYROIDAB in the last 72 hours. Anemia Panel: No results for input(s): VITAMINB12, FOLATE, FERRITIN, TIBC, IRON, RETICCTPCT in the last 72 hours. Urine analysis:    Component Value Date/Time   COLORURINE YELLOW 11/26/2013 1819   APPEARANCEUR TURBID (A) 11/26/2013 1819   LABSPEC 1.021 11/26/2013 1819   PHURINE 5.5 11/26/2013 1819   GLUCOSEU NEGATIVE 11/26/2013 1819   HGBUR SMALL (A) 11/26/2013 1819   BILIRUBINUR NEGATIVE 11/26/2013 1819   KETONESUR NEGATIVE 11/26/2013 1819   PROTEINUR >300 (A) 11/26/2013 1819   UROBILINOGEN 0.2 11/26/2013 1819   NITRITE POSITIVE (A) 11/26/2013 1819   LEUKOCYTESUR MODERATE (A) 11/26/2013 1819   Sepsis Labs: @LABRCNTIP (procalcitonin:4,lacticidven:4) ) Recent Results (from the past 240  hour(s))  Resp Panel by RT-PCR (Flu A&B, Covid) Nasopharyngeal Swab     Status: None   Collection Time: 05/19/20 10:57 AM   Specimen: Nasopharyngeal Swab; Nasopharyngeal(NP) swabs in vial transport medium  Result Value Ref Range Status   SARS Coronavirus 2 by RT PCR NEGATIVE NEGATIVE Final    Comment: (NOTE) SARS-CoV-2 target nucleic acids are NOT DETECTED.  The SARS-CoV-2 RNA is generally detectable in upper respiratory specimens during the acute phase of infection. The lowest concentration of SARS-CoV-2 viral copies this assay can detect is 138 copies/mL. A negative result does not preclude SARS-Cov-2 infection and should not be used as the sole  basis for treatment or other patient management decisions. A negative result may occur with  improper specimen collection/handling, submission of specimen other than nasopharyngeal swab, presence of viral mutation(s) within the areas targeted by this assay, and inadequate number of viral copies(<138 copies/mL). A negative result must be combined with clinical observations, patient history, and epidemiological information. The expected result is Negative.  Fact Sheet for Patients:  EntrepreneurPulse.com.au  Fact Sheet for Healthcare Providers:  IncredibleEmployment.be  This test is no t yet approved or cleared by the [place] FDA and  has been authorized for detection and/or diagnosis of SARS-CoV-2 by FDA under an Emergency Use Authorization (EUA). This EUA will remain  in effect (meaning this test can be used) for the duration of the COVID-19 declaration under Section 564(b)(1) of the Act, 21 U.S.C.section 360bbb-3(b)(1), unless the authorization is terminated  or revoked sooner.       Influenza A by PCR NEGATIVE NEGATIVE Final   Influenza B by PCR NEGATIVE NEGATIVE Final    Comment: (NOTE) The Xpert Xpress SARS-CoV-2/FLU/RSV plus assay is intended as an aid in the diagnosis of influenza from Nasopharyngeal swab specimens and should not be used as a sole basis for treatment. Nasal washings and aspirates are unacceptable for Xpert Xpress SARS-CoV-2/FLU/RSV testing.  Fact Sheet for Patients: EntrepreneurPulse.com.au  Fact Sheet for Healthcare Providers: IncredibleEmployment.be  This test is not yet approved or cleared by the [place] FDA and has been authorized for detection and/or diagnosis of SARS-CoV-2 by FDA under an Emergency Use Authorization (EUA). This EUA will remain in effect (meaning this test can be used) for the duration of the COVID-19 declaration under Section 564(b)(1) of the Act,  21 U.S.C. section 360bbb-3(b)(1), unless the authorization is terminated or revoked.  Performed at [facility], [place] [address], [place], [place] [address]   C Difficile Quick Screen w PCR reflex     Status: Abnormal   Collection Time: 05/19/20  5:47 PM   Specimen: STOOL  Result Value Ref Range Status   C Diff antigen POSITIVE (A) NEGATIVE Final   C Diff toxin NEGATIVE NEGATIVE Final   C Diff interpretation Results are indeterminate. See PCR results.  Final    Comment: Performed at [facility], [place] [address], [place],  [address]  Gastrointestinal Panel by PCR , Stool     Status: None   Collection Time: 05/19/20  5:47 PM   Specimen: STOOL  Result Value Ref Range Status   Campylobacter species NOT DETECTED NOT DETECTED Final   Plesimonas shigelloides NOT DETECTED NOT DETECTED Final   Salmonella species NOT DETECTED NOT DETECTED Final   Yersinia enterocolitica NOT DETECTED NOT DETECTED Final   Vibrio species NOT DETECTED NOT DETECTED Final   Vibrio cholerae NOT DETECTED NOT DETECTED Final   Enteroaggregative E coli (EAEC) NOT DETECTED NOT  DETECTED Final   Enteropathogenic E coli (EPEC) NOT DETECTED NOT DETECTED Final   Enterotoxigenic E coli (ETEC) NOT DETECTED NOT DETECTED Final   Shiga like toxin producing E coli (STEC) NOT DETECTED NOT DETECTED Final   Shigella/Enteroinvasive E coli (EIEC) NOT DETECTED NOT DETECTED Final   Cryptosporidium NOT DETECTED NOT DETECTED Final   Cyclospora cayetanensis NOT DETECTED NOT DETECTED Final   Entamoeba histolytica NOT DETECTED NOT DETECTED Final   Giardia lamblia NOT DETECTED NOT DETECTED Final   Adenovirus F40/41 NOT DETECTED NOT DETECTED Final   Astrovirus NOT DETECTED NOT DETECTED Final   Norovirus GI/GII NOT DETECTED NOT DETECTED Final   Rotavirus A NOT DETECTED NOT DETECTED Final   Sapovirus (I, II, IV, and V) NOT DETECTED NOT DETECTED Final    Comment: Performed at [facility], [address]  [place], [place] [address]  C. Diff by PCR, Reflexed     Status: None   Collection Time: 05/19/20  5:47 PM  Result Value Ref Range Status   Toxigenic C. Difficile by PCR NEGATIVE NEGATIVE Final    Comment: Patient is colonized with non toxigenic C. difficile. May not need treatment unless significant symptoms are present. Performed at [facility], [place] [address] [place], [place] [address]   Culture, blood (routine x 2)     Status: None (Preliminary result)   Collection Time: 05/24/20  8:20 PM   Specimen: BLOOD LEFT FOREARM  Result Value Ref Range Status   Specimen Description BLOOD LEFT FOREARM  Final   Special Requests   Final    BOTTLES DRAWN AEROBIC AND ANAEROBIC Blood Culture results may not be optimal due to an inadequate volume of blood received in culture bottles   Culture   Final    NO GROWTH 2 DAYS Performed at [facility], [place] [address] [place], [place] [address]    Report Status PENDING  Incomplete  Culture, blood (routine x 2)     Status: None (Preliminary result)   Collection Time: 05/24/20  8:25 PM   Specimen: BLOOD LEFT HAND  Result Value Ref Range Status   Specimen Description BLOOD LEFT HAND  Final   Special Requests   Final    BOTTLES DRAWN AEROBIC ONLY Blood Culture results may not be optimal due to an inadequate volume of blood received in culture bottles   Culture   Final    NO GROWTH 2 DAYS Performed at [facility], [place] [address] [place], [place] [address]    Report Status PENDING  Incomplete         Radiology Studies: No results found.    Scheduled Meds: . calcium acetate  1,334 mg Oral TID WC  . cholestyramine light  4 g Oral QID  . cinacalcet  30 mg Oral Q supper  . clopidogrel  75 mg Oral Daily  . diphenoxylate-atropine  5 mL Per Tube QID  . doxercalciferol  2 mcg Intravenous Q T,Th,Sa-HD  . feeding supplement (PROSource TF)  45 mL Per Tube BID  . folic acid  1 mg Per Tube Daily  . metoprolol tartrate  12.5 mg Per Tube  BID  . multivitamin  1 tablet Per Tube QHS  . pantoprazole sodium  40 mg Per Tube Daily   Continuous Infusions: . sodium chloride Stopped (05/24/20 0228)  . feeding supplement (OSMOLITE 1.5 CAL) 1,000 mL (05/25/20 2004)  . sodium chloride Stopped (05/23/20 0537)     LOS: 7 days  Debbe Odea, MD [facility] Pager: [email or web address] 05/27/2020, 12:18 PM

## 2020-05-27 NOTE — Progress Notes (Addendum)
Nutrition Follow-up  DOCUMENTATION CODES:   Underweight,Severe malnutrition in context of chronic illness  INTERVENTION:   -Continue renal MVI daily -Continue Magic cup TID with meals, each supplement provides 290 kcal and 9 grams of protein -Continue Osmolite 1.5@ 50ml/hrvia cortrak tube  45ml Prosource TF BID.   Tube feeding regimen provides1880kcal (100% of needs),86grams of protein, and 912ml of H2O.   NUTRITION DIAGNOSIS:   Severe Malnutrition related to chronic illness (ESRD on HD) as evidenced by severe fat depletion,severe muscle depletion.  Ongoing  GOAL:   Patient will meet greater than or equal to 90% of their needs  Met with TF  MONITOR:   PO intake,Supplement acceptance,Labs,Weight trends,TF tolerance,Skin,I & O's  REASON FOR ASSESSMENT:   Consult Assessment of nutrition requirement/status  ASSESSMENT:   Karen Morrow is a 59 y.o. female with medical history significant of ESRD, HTN, anemia of chronic disease, PAD on Plavix, hx of Upper gi bleed, chronic diastolic CHF, asthma, atrial fibrillation not on anticoagulation secondary to recurrent bleeding, GERD, hepatitis C antibody positive, aortic insufficiency, history of pulmonary hypertension due to left ventricular diastolic dysfunction, tobacco abuse  12/22- s/p EGD- revealed benign- appearing esophageal stenosis, gastritis (biopsied), duodenitis (biopsied), non-bleeding duodenal diverticulum 12/24- cortrak placed (tip of tube on stomach), TF initiated  Per RN notes, mentation waxing and waning. She continues to be unable to take PO medications, food, or liquids.   Goals of care discussions ongoing. Per hospitalist note, plan to monitor PO intake for a week with cortrak tube. Pt husband understanding that pt may require PEG and SNF placement if he still desires full scope care.   Pt continues with diarrhea through rectal tube. Rectal bleeding has resolved. GI has evaluated and  recommending colonoscopy.   Per nephrology notes, HD held today secondary to hypotension. Plan to re-evaluate tomorrow (05/28/20) for HD needs. If pt continues to be unsafe for HD treatments, may need to transition to comfort care.   Medications reviewed and include sensipar and folic acid.  Labs reviewed: Na: 131, Phos: 2.4, K WDL. CBGS: 87-97.  Diet Order:   Diet Order            Diet renal with fluid restriction Fluid restriction: 1200 mL Fluid; Room service appropriate? Yes; Fluid consistency: Thin  Diet effective now                 EDUCATION NEEDS:   No education needs have been identified at this time  Skin:  Skin Assessment: Skin Integrity Issues: Skin Integrity Issues:: Other (Comment),Stage I Stage I: rt ischial tuberosity, rt lateral lumbar Other: pressure injury rt upper abdomen  Last BM:  05/26/20 (via rectal tube)  Height:   Ht Readings from Last 1 Encounters:  05/19/20 5' 7" (1.702 m)    Weight:   Wt Readings from Last 1 Encounters:  05/27/20 53.1 kg    Ideal Body Weight:     BMI:  Body mass index is 18.33 kg/m.  Estimated Nutritional Needs:   Kcal:  1800-2000  Protein:  90-105 grams  Fluid:  1000 ml + UOP     W, RD, LDN, CDCES Registered Dietitian II Certified Diabetes Care and Education Specialist Please refer to AMION for RD and/or RD on-call/weekend/after hours pager 

## 2020-05-27 NOTE — Progress Notes (Signed)
Patient ID: Karen Morrow, female   DOB: 1960/09/10, 59 y.o.   MRN: [id number]  [facility] Progress Note   Assessment/ Plan:    1. Diarrhea: continuous, w/ rectal tube in place.   2. Hypotension: recurrent issue, is over her dry wt 10kg by wts. This precludes further IVF boluses / fluids. Significant 3rd spacing which is due to low albumin and chronic illness state.  3. R/O GI bleed: EGD done by GI here +gastritis, no ulcer. Hx prior GU's by endoscopy in 2020-21. No bloody stools or melena here but does have bad case of diarrhea. Per primary / GI.  4. ESRD: on HD TTS. Missed HD x 1 week. Has had HD x 3 here. Could not get HD today due to hypotension and poor mental status. Will reassess tomorrow.  5. CKD-MBD: Phosphorus level low-binders discontinued earlier.  Continue Hectorol for PTH suppression; corrected calcium level is 9, continue Sensipar  6. Protein calorie malnutrition/failure to thrive: With significant reduction of appetite/weight loss with ongoing GI issues; ongoing tube feeds at this time. Will give some IVF"s and IV albumin.  7. Atrial fibrillation w/ RVR: At this time, she is rate controlled but in atrial fibrillation.  8. DNR - pt is very ill acutely and chronically, will dialyze her if it is safe. Was not safe to attempt HD today. Will reassess tomorrow. Would consider, if not improving soon, transition to comfort care. Have d/w pmd.  Kelly Splinter, MD 05/27/2020, 1:45 PM   Subjective:   Pt took hypotensive for HD this am.  Mental status this am is altered.    Objective:   BP (!) 79/59 (BP Location: Right Arm)   Pulse 100   Temp 99.5 F (37.5 C) (Oral)   Resp 20   Ht 5\' 7"  (1.702 m)   Wt 53.1 kg   SpO2 98%   BMI 18.33 kg/m   Physical Exam: Gen: Comfortably resting in bed-lethargic to conversation. CVS: Pulse irregularly irregular tachycardia, S1 and S2 without murmur Resp: Clear to auscultation, no distinct rales or rhonchi Abd: Soft, scaphoid,  nontender Ext: No lower extremity edema.  Dressings over left upper arm AVF. Neuro: Ox2, nonfocal, gen weakness  Assessment/ Plan:   Dialysis Orders:[facility] T,Th,S 3:45 hr 180NRe 350/600 42 kg 3.0K/2.25 Ca AVF -Mircera 50 mcg IV q 2 weeks -Hectorol 2 mcg IV q 2 weeks -Venofer 50 mg IV weekly  Labs: BMET Recent Labs  Lab 05/21/20 0310 05/21/20 1248 05/22/20 0909 05/22/20 1543 05/23/20 1233 05/23/20 2214 05/24/20 0226 05/24/20 1651 05/25/20 0337 05/26/20 0203 05/27/20 1059  NA 135   < > 133*   < > 133* 129* 127* 128* 126* 131* 131*  K 2.3*   < > 2.4*   < > 2.6* 3.6 3.6 4.2 4.1 3.9 4.5  CL 103   < > 102   < > 101 97* 97* 101 99 100 99  CO2 20*   < > 19*   < > 22 22 21* 19* 19* 21* 22  GLUCOSE 86   < > 98   < > 93 114* 129* 94 100* 96 86  BUN 10   < > 13   < > 5* 7 9 14 19 15  34*  CREATININE 4.66*   < > 5.58*   < > 3.43* 3.69* 3.86* 4.32* 4.65* 3.06* 4.02*  CALCIUM 7.7*   < > 7.6*   < > 7.7* 7.9* 7.9* 7.7* 7.4* 7.3* 7.4*  PHOS 2.1*  --  2.5  --   --   --  1.5*  --   --   --  2.4*   < > = values in this interval not displayed.   CBC Recent Labs  Lab 05/23/20 0105 05/25/20 0337 05/25/20 1639 05/27/20 1059  WBC 8.6 8.8 6.4 6.1  HGB 12.8 10.2* 8.8* 10.9*  HCT 36.2 30.8* 25.4* 32.8*  MCV 82.1 83.9 82.7 83.9  PLT 58* 61* 63* 115*     Medications:    . calcium acetate  1,334 mg Oral TID WC  . cholestyramine light  4 g Oral QID  . cinacalcet  30 mg Oral Q supper  . clopidogrel  75 mg Oral Daily  . diphenoxylate-atropine  5 mL Per Tube QID  . doxercalciferol  2 mcg Intravenous Q T,Th,Sa-HD  . feeding supplement (PROSource TF)  45 mL Per Tube BID  . folic acid  1 mg Per Tube Daily  . metoprolol tartrate  12.5 mg Per Tube BID  . multivitamin  1 tablet Per Tube QHS  . pantoprazole sodium  40 mg Per Tube Daily

## 2020-05-28 DIAGNOSIS — R197 Diarrhea, unspecified: Secondary | ICD-10-CM

## 2020-05-28 DIAGNOSIS — L89152 Pressure ulcer of sacral region, stage 2: Secondary | ICD-10-CM

## 2020-05-28 DIAGNOSIS — K922 Gastrointestinal hemorrhage, unspecified: Secondary | ICD-10-CM | POA: Diagnosis not present

## 2020-05-28 DIAGNOSIS — K2970 Gastritis, unspecified, without bleeding: Secondary | ICD-10-CM | POA: Diagnosis not present

## 2020-05-28 DIAGNOSIS — N186 End stage renal disease: Secondary | ICD-10-CM | POA: Diagnosis not present

## 2020-05-28 DIAGNOSIS — L8990 Pressure ulcer of unspecified site, unspecified stage: Secondary | ICD-10-CM | POA: Insufficient documentation

## 2020-05-28 DIAGNOSIS — I5032 Chronic diastolic (congestive) heart failure: Secondary | ICD-10-CM | POA: Diagnosis not present

## 2020-05-28 LAB — RENAL FUNCTION PANEL
Albumin: 1.8 g/dL — ABNORMAL LOW (ref 3.5–5.0)
Anion gap: 10 (ref 5–15)
BUN: 44 mg/dL — ABNORMAL HIGH (ref 6–20)
CO2: 20 mmol/L — ABNORMAL LOW (ref 22–32)
Calcium: 7.4 mg/dL — ABNORMAL LOW (ref 8.9–10.3)
Chloride: 99 mmol/L (ref 98–111)
Creatinine, Ser: 4.41 mg/dL — ABNORMAL HIGH (ref 0.44–1.00)
GFR, Estimated: 11 mL/min — ABNORMAL LOW (ref >60)
Glucose, Bld: 101 mg/dL — ABNORMAL HIGH (ref 70–99)
Phosphorus: 2.7 mg/dL (ref 2.5–4.6)
Potassium: 4.9 mmol/L (ref 3.5–5.1)
Sodium: 129 mmol/L — ABNORMAL LOW (ref 135–145)

## 2020-05-28 LAB — GLUCOSE, CAPILLARY
Glucose-Capillary: 103 mg/dL — ABNORMAL HIGH (ref 70–99)
Glucose-Capillary: 92 mg/dL (ref 70–99)
Glucose-Capillary: 96 mg/dL (ref 70–99)

## 2020-05-28 LAB — VITAMIN B1: Vitamin B1 (Thiamine): 470 nmol/L — ABNORMAL HIGH (ref 66.5–200.0)

## 2020-05-28 MED ORDER — ATROPINE SULFATE 1 % OP SOLN
4.0000 [drp] | OPHTHALMIC | Status: DC | PRN
  Filled 2020-05-28: qty 2

## 2020-05-28 MED ORDER — MORPHINE SULFATE (PF) 4 MG/ML IV SOLN: 4.0000 mg | INTRAVENOUS | Status: DC | PRN

## 2020-05-28 MED ORDER — SODIUM CHLORIDE 0.9% FLUSH
3.0000 mL | Freq: Two times a day (BID) | INTRAVENOUS | Status: DC
  Administered 2020-05-28: 3 mL via INTRAVENOUS

## 2020-05-28 MED ORDER — LORAZEPAM 1 MG PO TABS: 1.0000 mg | ORAL_TABLET | ORAL | Status: DC | PRN

## 2020-05-28 MED ORDER — LORAZEPAM 2 MG/ML PO CONC
1.0000 mg | ORAL | Status: DC | PRN
  Administered 2020-05-29 – 2020-05-30 (×2): 1 mg via SUBLINGUAL
  Filled 2020-05-28: qty 0.5
  Filled 2020-05-28: qty 1

## 2020-05-28 MED ORDER — SODIUM CHLORIDE 0.9 % IV SOLN: 250.0000 mL | INTRAVENOUS | Status: DC | PRN

## 2020-05-28 MED ORDER — SODIUM CHLORIDE 0.9% FLUSH: 3.0000 mL | INTRAVENOUS | Status: DC | PRN

## 2020-05-28 MED ORDER — LORAZEPAM 2 MG/ML IJ SOLN
1.0000 mg | INTRAMUSCULAR | Status: DC | PRN
  Administered 2020-05-28 (×2): 1 mg via INTRAVENOUS
  Filled 2020-05-28 (×2): qty 1

## 2020-05-28 NOTE — Consult Note (Signed)
   [facility] Inpatient Consult   05/28/2020  Karen Morrow 1960/09/07 [id number]  [name] Patient: Medicare  Chart reviewed and reveals the patient is currently transitioning to Hospice/Palliative Care.   Plan: hospice facility for transition and will sign off at transition.   Natividad Brood, RN BSN [facility] Liaison  [phone] business mobile phone Toll free office [phone]  Fax number: [phone] [email or web address]

## 2020-05-28 NOTE — Progress Notes (Addendum)
Manufacturing engineer [facility])  Referral received for EOL care at [facility].  There is not a bed to offer today.  Spoke with her husband, offered support and answered questions.   ACC will determine if she is eligible for residential hospice and update family and TOC once that decision has been made.   Thank you, Venia Carbon RN, BSN, [facility] Liaison

## 2020-05-28 NOTE — Progress Notes (Signed)
Note plan is now for transition to hospice/ comfort care.  No further dialysis. Will sign off.   Kelly Splinter, MD 05/28/2020, 12:24 PM

## 2020-05-28 NOTE — Progress Notes (Addendum)
[facility] PROGRESS NOTE    Progress Note  Karen Morrow  SJG:[id number] DOB: 1961-04-25 DOA: 05/19/2020 PCP: [name], MD     Brief Narrative:   Karen Morrow is an 59 y.o. female end-stage renal disease with dialyzes Tuesday Thursdays and Saturdays, A. fib not on anticoagulation due to GI bleed, essential hypertension chronic diastolic heart failure peripheral vascular disease on Plavix who presents with 1 week of rectal bleeding, in the ER was found to be hypotensive and hypoglycemic with a hemoglobin of 7.6 he was given 2 units of packed red blood cells GI was consulted.  Assessment/Plan:   GI bleed: Per ER doctor stool was black-green, question due to ferric citrate. History of peptic ulcer disease, she has had several EGDs in the past, she status post EGD on this admission that showed no ulcer but additional gastritis surgical pathology was negative for malignancy. No further bloody stools in house.  Refractory watery diarrhea: With poor oral intake she still is having watery diarrhea requiring a rectal tube. C. difficile antigen was positive toxin negative GI was consulted and recommended to use antimotility agent not treating C. difficile at this time. On 05/23/2020 core track placed in CT scan of the abdomen and pelvis was unrevealing for etiology of her diarrhea.  She did have some ascites and small pleural effusion. On Lomotil 4 times daily and cholestyramine. Stool pathogens negative.  Failure to thrive/hypoalbuminemia/severe protein caloric malnutrition: She is third spacing with evidence of ascites and pleural effusion. Diarrhea might be playing a role as she has had significant weight loss. Feeding tube placed on 05/23/2020. Husband decided to move towards comfort care.  End-stage renal disease: Continue dialysis per renal.  They were unable to dialyze on 05/27/2020. Renal is recommending that if her blood pressure does not improve they  strongly recommend transitioning towards comfort care. Husband decided to move towards comfort care.  Hypokalemia: Likely due to diarrhea poor oral intake.Marland Kitchen  Hypervolemic hyponatremia: Sodium is better after dialysis.  Hypomagnesemia:  Anemia of chronic disease versus GI loss: She is status post 2 units of packed red blood cells, her hemoglobin has been stable at 11.  Paroxysmal atrial fibrillation: Not on anticoagulation due to prior GI bleeds. We are holding her metoprolol due to her hypotension.  She developed RVR overnight.  She was given a bolus of normal saline and amiodarone. Metoprolol restarted on 05/19/2020 control.  Acute thrombocytopenia: Not on anticoagulation due to low platelets, platelets improved from 63-1 15 on 05/27/2020.  We will continue to monitor if the trend continues upwards.  PAD: Plavix was resumed on 05/26/2020.  Hepatic cirrhosis: Question fatty liver noted on ultrasound in March 2021.  Dementia without behavioral disturbances: A poor historian forgetful in taking her medication she does not have capacity to make medical decisions.  Goals of care/ethics: After having a long discussion with the husband he decided to move towards comfort care, he does not want any labs drawn, he would like to stop dialysis. We will discontinue the core track, telemetry and continue the rectal tube. We will use morphine for pain or shortness of breath and Ativan for anxiety or agitation.  RN Pressure Injury Documentation: Pressure Injury 05/26/20 Ischial tuberosity Posterior;Proximal;Right Stage 1 -  Intact skin with non-blanchable redness of a localized area usually over a bony prominence. nonblancheable redness, irregular shape (Active)  05/26/20 1030  Location: Ischial tuberosity  Location Orientation: Posterior;Proximal;Right  Staging: Stage 1 -  Intact skin with non-blanchable redness of  a localized area usually over a bony prominence.  Wound Description  (Comments): nonblancheable redness, irregular shape  Present on Admission: No     Pressure Injury 05/26/20 Lumbar Lateral;Right Stage 1 -  Intact skin with non-blanchable redness of a localized area usually over a bony prominence. nonblancheable, irregular shape (Active)  05/26/20 1030  Location: Lumbar  Location Orientation: Lateral;Right  Staging: Stage 1 -  Intact skin with non-blanchable redness of a localized area usually over a bony prominence.  Wound Description (Comments): nonblancheable, irregular shape  Present on Admission: No     Pressure Injury 05/26/20 Abdomen Right;Upper (Active)  05/26/20 1030  Location: Abdomen  Location Orientation: Right;Upper  Staging:   Wound Description (Comments):   Present on Admission:      DVT prophylaxis: SCD Family Communication:husband Status is: Inpatient  Remains inpatient appropriate because:Hemodynamically unstable   Dispo: The patient is from: Home              Anticipated d/c is to: Home              Anticipated d/c date is: > 3 days              Patient currently is not medically stable to d/c.        Code Status:     Code Status Orders  (From admission, onward)         Start     Ordered   05/25/20 0844  Do not attempt resuscitation (DNR)  Continuous       Question Answer Comment  In the event of cardiac or respiratory ARREST Do not call a "code blue"   In the event of cardiac or respiratory ARREST Do not perform Intubation, CPR, defibrillation or ACLS   In the event of cardiac or respiratory ARREST Use medication by any route, position, wound care, and other measures to relive pain and suffering. May use oxygen, suction and manual treatment of airway obstruction as needed for comfort.      05/25/20 0843        Code Status History    Date Active Date Inactive Code Status Order ID Comments User Context   05/20/2020 0125 05/25/2020 0843 Full Code [id number]  Deland Pretty, MD ED   01/21/2020 1409  01/21/2020 2122 Full Code [id number]  Waynetta Sandy, MD Inpatient   10/13/2019 1741 10/16/2019 1512 Full Code [id number]  Mikki Harbor, MD ED   08/01/2019 0937 08/05/2019 1926 Full Code [id number]  Karmen Bongo, MD ED   11/27/2018 0515 11/27/2018 2302 Full Code [id number]  Ivor Costa, MD ED   10/05/2018 1830 10/07/2018 1911 Full Code [id number]  Hosie Poisson, MD Inpatient   09/29/2018 1536 10/01/2018 1540 Full Code [id number]  Norval Morton, MD ED   09/15/2018 2358 09/19/2018 1716 Full Code [id number]  Bernadette Hoit, DO ED   05/13/2018 0538 05/16/2018 1151 Full Code [id number]  Etta Quill, DO ED   03/10/2018 0831 03/19/2018 1837 Full Code [id number]  Lorella Nimrod, MD ED   09/07/2016 0200 09/09/2016 1754 Full Code [id number]  Rise Patience, MD ED   11/27/2013 1148 12/07/2013 1232 Full Code [id number]  Markus Daft, MD Inpatient   11/26/2013 1853 11/27/2013 1148 Full Code [id number]  Shanda Howells, MD ED   06/05/2013 1555 06/07/2013 1450 Full Code [id number]  Velvet Bathe, MD Inpatient   11/03/2012 1008 11/05/2012 1259 Full Code [id number]  Robbie Lis, MD Inpatient   Advance Care Planning Activity  IV Access:    Peripheral IV   Procedures and diagnostic studies:   No results found.   Medical Consultants:    None.  Anti-Infectives:   none  Subjective:    Karen Morrow she relates she is not hungry has no new complaints this morning.  Objective:    Vitals:   05/28/20 0000 05/28/20 0400 05/28/20 0500 05/28/20 0600  BP: 92/69 97/76    Pulse: 99  93 96  Resp: (!) 24 (!) 27 (!) 26 (!) 25  Temp: 98 F (36.7 C) 98.5 F (36.9 C)    TempSrc: Oral Oral    SpO2: 95% 100% 100% 100%  Weight:  53.8 kg    Height:       SpO2: 100 %   Intake/Output Summary (Last 24 hours) at 05/28/2020 0716 Last data filed at 05/27/2020 1932 Gross per 24 hour  Intake --  Output 525 ml  Net -525 ml   Filed Weights   05/26/20 0350 05/27/20 0555 05/28/20 0400  Weight: 52  kg 53.1 kg 53.8 kg    Exam: General exam: In no acute distress, cachectic Respiratory system: Good air movement and clear to auscultation. Cardiovascular system: S1 & S2 heard, RRR.  Positive JVD Gastrointestinal system: Abdomen is nondistended, soft and nontender.  Extremities: No pedal edema. Skin: No rashes, lesions or ulcers Psychiatry: Poor insight and judgment on medical condition.   Data Reviewed:    Labs: Basic Metabolic Panel: Recent Labs  Lab 05/22/20 0909 05/22/20 1543 05/23/20 1233 05/23/20 2214 05/24/20 0226 05/24/20 1651 05/25/20 0337 05/26/20 0203 05/27/20 1059 05/28/20 0346  NA 133*   < > 133* 129* 127* 128* 126* 131* 131* 129*  K 2.4*   < > 2.6* 3.6 3.6 4.2 4.1 3.9 4.5 4.9  CL 102   < > 101 97* 97* 101 99 100 99 99  CO2 19*   < > 22 22 21* 19* 19* 21* 22 20*  GLUCOSE 98   < > 93 114* 129* 94 100* 96 86 101*  BUN 13   < > 5* 7 9 14 19 15  34* 44*  CREATININE 5.58*   < > 3.43* 3.69* 3.86* 4.32* 4.65* 3.06* 4.02* 4.41*  CALCIUM 7.6*   < > 7.7* 7.9* 7.9* 7.7* 7.4* 7.3* 7.4* 7.4*  MG  --   --  1.5* 1.5* 2.2 1.9  --   --   --   --   PHOS 2.5  --   --   --  1.5*  --   --   --  2.4* 2.7   < > = values in this interval not displayed.   GFR Estimated Creatinine Clearance: 11.5 mL/min (A) (by C-G formula based on SCr of 4.41 mg/dL (H)). Liver Function Tests: Recent Labs  Lab 05/21/20 2029 05/22/20 0909 05/22/20 1543 05/24/20 0226 05/27/20 1059 05/28/20 0346  AST 25  --  19  --   --   --   ALT 14  --  12  --   --   --   ALKPHOS 61  --  61  --   --   --   BILITOT 0.8  --  1.2  --   --   --   PROT 4.8*  --  4.9*  --   --   --   ALBUMIN 1.9* 1.8* 2.0* 2.6* 1.9* 1.8*   No results for input(s): LIPASE, AMYLASE in the last 168 hours. No results for input(s): AMMONIA in the  last 168 hours. Coagulation profile No results for input(s): INR, PROTIME in the last 168 hours. COVID-19 Labs  No results for input(s): DDIMER, FERRITIN, LDH, CRP in the last 72  hours.  Lab Results  Component Value Date   SARSCOV2NAA NEGATIVE 05/19/2020   [place] NEGATIVE 01/18/2020   [place] NEGATIVE 10/13/2019   [place] NEGATIVE 08/01/2019    CBC: Recent Labs  Lab 05/22/20 0903 05/22/20 2118 05/23/20 0105 05/25/20 0337 05/25/20 1639 05/27/20 1059  WBC 7.8  --  8.6 8.8 6.4 6.1  HGB 11.5* 12.2 12.8 10.2* 8.8* 10.9*  HCT 34.2* 36.5 36.2 30.8* 25.4* 32.8*  MCV 82.6  --  82.1 83.9 82.7 83.9  PLT 59*  --  58* 61* 63* 115*   Cardiac Enzymes: No results for input(s): CKTOTAL, CKMB, CKMBINDEX, TROPONINI in the last 168 hours. BNP (last 3 results) No results for input(s): PROBNP in the last 8760 hours. CBG: Recent Labs  Lab 05/27/20 1143 05/27/20 1620 05/27/20 2041 05/28/20 0018 05/28/20 0405  GLUCAP 92 83 87 92 103*   D-Dimer: No results for input(s): DDIMER in the last 72 hours. Hgb A1c: No results for input(s): HGBA1C in the last 72 hours. Lipid Profile: No results for input(s): CHOL, HDL, LDLCALC, TRIG, CHOLHDL, LDLDIRECT in the last 72 hours. Thyroid function studies: No results for input(s): TSH, T4TOTAL, T3FREE, THYROIDAB in the last 72 hours.  Invalid input(s): FREET3 Anemia work up: No results for input(s): VITAMINB12, FOLATE, FERRITIN, TIBC, IRON, RETICCTPCT in the last 72 hours. Sepsis Labs: Recent Labs  Lab 05/23/20 0105 05/25/20 0337 05/25/20 1639 05/27/20 1059  WBC 8.6 8.8 6.4 6.1   Microbiology Recent Results (from the past 240 hour(s))  Resp Panel by RT-PCR (Flu A&B, Covid) Nasopharyngeal Swab     Status: None   Collection Time: 05/19/20 10:57 AM   Specimen: Nasopharyngeal Swab; Nasopharyngeal(NP) swabs in vial transport medium  Result Value Ref Range Status   SARS Coronavirus 2 by RT PCR NEGATIVE NEGATIVE Final    Comment: (NOTE) SARS-CoV-2 target nucleic acids are NOT DETECTED.  The SARS-CoV-2 RNA is generally detectable in upper respiratory specimens during the acute phase of infection. The  lowest concentration of SARS-CoV-2 viral copies this assay can detect is 138 copies/mL. A negative result does not preclude SARS-Cov-2 infection and should not be used as the sole basis for treatment or other patient management decisions. A negative result may occur with  improper specimen collection/handling, submission of specimen other than nasopharyngeal swab, presence of viral mutation(s) within the areas targeted by this assay, and inadequate number of viral copies(<138 copies/mL). A negative result must be combined with clinical observations, patient history, and epidemiological information. The expected result is Negative.  Fact Sheet for Patients:  EntrepreneurPulse.com.au  Fact Sheet for Healthcare Providers:  IncredibleEmployment.be  This test is no t yet approved or cleared by the [place] FDA and  has been authorized for detection and/or diagnosis of SARS-CoV-2 by FDA under an Emergency Use Authorization (EUA). This EUA will remain  in effect (meaning this test can be used) for the duration of the COVID-19 declaration under Section 564(b)(1) of the Act, 21 U.S.C.section 360bbb-3(b)(1), unless the authorization is terminated  or revoked sooner.       Influenza A by PCR NEGATIVE NEGATIVE Final   Influenza B by PCR NEGATIVE NEGATIVE Final    Comment: (NOTE) The Xpert Xpress SARS-CoV-2/FLU/RSV plus assay is intended as an aid in the diagnosis of influenza from Nasopharyngeal swab specimens and should not be  used as a sole basis for treatment. Nasal washings and aspirates are unacceptable for Xpert Xpress SARS-CoV-2/FLU/RSV testing.  Fact Sheet for Patients: EntrepreneurPulse.com.au  Fact Sheet for Healthcare Providers: IncredibleEmployment.be  This test is not yet approved or cleared by the [place] FDA and has been authorized for detection and/or diagnosis of SARS-CoV-2 by FDA under  an Emergency Use Authorization (EUA). This EUA will remain in effect (meaning this test can be used) for the duration of the COVID-19 declaration under Section 564(b)(1) of the Act, 21 U.S.C. section 360bbb-3(b)(1), unless the authorization is terminated or revoked.  Performed at [facility], [place] [address] [place], [place] [address]   C Difficile Quick Screen w PCR reflex     Status: Abnormal   Collection Time: 05/19/20  5:47 PM   Specimen: STOOL  Result Value Ref Range Status   C Diff antigen POSITIVE (A) NEGATIVE Final   C Diff toxin NEGATIVE NEGATIVE Final   C Diff interpretation Results are indeterminate. See PCR results.  Final    Comment: Performed at [facility], [place] [address] [place], [place] [address]  Gastrointestinal Panel by PCR , Stool     Status: None   Collection Time: 05/19/20  5:47 PM   Specimen: STOOL  Result Value Ref Range Status   Campylobacter species NOT DETECTED NOT DETECTED Final   Plesimonas shigelloides NOT DETECTED NOT DETECTED Final   Salmonella species NOT DETECTED NOT DETECTED Final   Yersinia enterocolitica NOT DETECTED NOT DETECTED Final   Vibrio species NOT DETECTED NOT DETECTED Final   Vibrio cholerae NOT DETECTED NOT DETECTED Final   Enteroaggregative E coli (EAEC) NOT DETECTED NOT DETECTED Final   Enteropathogenic E coli (EPEC) NOT DETECTED NOT DETECTED Final   Enterotoxigenic E coli (ETEC) NOT DETECTED NOT DETECTED Final   Shiga like toxin producing E coli (STEC) NOT DETECTED NOT DETECTED Final   Shigella/Enteroinvasive E coli (EIEC) NOT DETECTED NOT DETECTED Final   Cryptosporidium NOT DETECTED NOT DETECTED Final   Cyclospora cayetanensis NOT DETECTED NOT DETECTED Final   Entamoeba histolytica NOT DETECTED NOT DETECTED Final   Giardia lamblia NOT DETECTED NOT DETECTED Final   Adenovirus F40/41 NOT DETECTED NOT DETECTED Final   Astrovirus NOT DETECTED NOT DETECTED Final   Norovirus GI/GII NOT DETECTED NOT DETECTED Final    Rotavirus A NOT DETECTED NOT DETECTED Final   Sapovirus (I, II, IV, and V) NOT DETECTED NOT DETECTED Final    Comment: Performed at [facility], [place] [place], [place] [address]  C. Diff by PCR, Reflexed     Status: None   Collection Time: 05/19/20  5:47 PM  Result Value Ref Range Status   Toxigenic C. Difficile by PCR NEGATIVE NEGATIVE Final    Comment: Patient is colonized with non toxigenic C. difficile. May not need treatment unless significant symptoms are present. Performed at [facility], [place] [address] [place], [place] [address]   Culture, blood (routine x 2)     Status: None (Preliminary result)   Collection Time: 05/24/20  8:20 PM   Specimen: BLOOD LEFT FOREARM  Result Value Ref Range Status   Specimen Description BLOOD LEFT FOREARM  Final   Special Requests   Final    BOTTLES DRAWN AEROBIC AND ANAEROBIC Blood Culture results may not be optimal due to an inadequate volume of blood received in culture bottles   Culture   Final    NO GROWTH 3 DAYS Performed at [facility], [place] [address], [place], [place] [address]    Report Status PENDING  Incomplete  Culture, blood (routine x 2)     Status: None (Preliminary result)   Collection Time: 05/24/20  8:25 PM   Specimen: BLOOD LEFT HAND  Result Value Ref Range Status   Specimen Description BLOOD LEFT HAND  Final   Special Requests   Final    BOTTLES DRAWN AEROBIC ONLY Blood Culture results may not be optimal due to an inadequate volume of blood received in culture bottles   Culture   Final    NO GROWTH 3 DAYS Performed at [facility], [place] [address], [place], [place] [address]    Report Status PENDING  Incomplete     Medications:   . calcium acetate (Phos Binder)  1,334 mg Per Tube TID WC  . cholestyramine light  4 g Oral QID  . cinacalcet  30 mg Oral Q supper  . clopidogrel  75 mg Oral Daily  . diphenoxylate-atropine  5 mL Per Tube QID  . doxercalciferol  2 mcg Intravenous Q  T,Th,Sa-HD  . feeding supplement (PROSource TF)  45 mL Per Tube BID  . folic acid  1 mg Per Tube Daily  . metoprolol tartrate  12.5 mg Per Tube BID  . multivitamin  1 tablet Per Tube QHS  . pantoprazole sodium  40 mg Per Tube Daily   Continuous Infusions: . sodium chloride Stopped (05/24/20 0228)  . feeding supplement (OSMOLITE 1.5 CAL) 1,000 mL (05/25/20 2004)  . sodium chloride Stopped (05/23/20 0537)      LOS: 8 days   Charlynne Cousins  [facility]  05/28/2020, 7:16 AM

## 2020-05-28 NOTE — Progress Notes (Signed)
CSW called pt spouse Yehuda Savannah and discussed plan for hospice facility. Spouse prefers [facility]. CSW made referral to [facility]. They will review. Possible bed available tomorrow.

## 2020-05-29 DIAGNOSIS — I5032 Chronic diastolic (congestive) heart failure: Secondary | ICD-10-CM | POA: Diagnosis not present

## 2020-05-29 DIAGNOSIS — K922 Gastrointestinal hemorrhage, unspecified: Secondary | ICD-10-CM | POA: Diagnosis not present

## 2020-05-29 DIAGNOSIS — N186 End stage renal disease: Secondary | ICD-10-CM | POA: Diagnosis not present

## 2020-05-29 DIAGNOSIS — K2970 Gastritis, unspecified, without bleeding: Secondary | ICD-10-CM | POA: Diagnosis not present

## 2020-05-29 LAB — CULTURE, BLOOD (ROUTINE X 2)
Culture: NO GROWTH
Culture: NO GROWTH

## 2020-05-29 MED ORDER — MORPHINE SULFATE (PF) 4 MG/ML IV SOLN: 4.0000 mg | INTRAVENOUS | 0 refills | Status: DC | PRN

## 2020-05-29 MED ORDER — MORPHINE SULFATE (CONCENTRATE) 10 MG/0.5ML PO SOLN
10.0000 mg | ORAL | Status: DC | PRN
  Administered 2020-05-30: 10 mg via ORAL
  Filled 2020-05-29 (×2): qty 0.5

## 2020-05-29 MED ORDER — LORAZEPAM 2 MG/ML PO CONC: 1.0000 mg | ORAL | 0 refills | Status: DC | PRN

## 2020-05-29 NOTE — Discharge Summary (Signed)
Physician Discharge Summary  Karen Morrow XMI:[id number] DOB: May 02, 1961 DOA: 05/19/2020  PCP: [name], MD  Admit date: 05/19/2020 Discharge date: 05/29/2020  Admitted From: Home Disposition: Residential hospice facility   Recommendations for Outpatient Follow-up:  1. Transfer to residential hospice facility with comfort measures.  Home Health:No Equipment/Devices:None  Discharge Condition:Hospice CODE STATUS:DNR Diet recommendation: Heart Healthy   Brief/Interim Summary: 59 y.o. female end-stage renal disease with dialyzes Tuesday Thursdays and Saturdays, A. fib not on anticoagulation due to GI bleed, essential hypertension chronic diastolic heart failure peripheral vascular disease on Plavix who presents with 1 week of rectal bleeding, in the ER was found to be hypotensive and hypoglycemic with a hemoglobin of 7.6 he was given 2 units of packed red blood cells GI was consulted.   Discharge Diagnoses:  Active Problems:   Abdominal pain   Thrombocytopenia (HCC)   Hepatic cirrhosis (HCC)   Upper GI bleeding   ESRD on dialysis ([place])   Gastroesophageal reflux disease   Pulmonary hypertension due to left ventricular diastolic dysfunction (HCC)   Chronic diastolic (congestive) heart failure (HCC)   Symptomatic anemia   Tobacco abuse   Hypokalemia   Hyperphosphatemia   Hypoglycemia   Upper GI bleed   Lower GI bleed   Gastritis and gastroduodenitis   Protein-calorie malnutrition, severe   Pressure injury of skin  Possible GI bleed: With a history of peptic ulcer disease EGD on admission showed no ulcer but some gastritis no signs of bleeding surgical pathology was negative for malignancy.  Refractory watery diarrhea: With poor oral intake rectal tube in place C. difficile antigen positive toxin negative GI recommended not to treat.  Core track placed on 05/23/2020 due to the significant decreased oral intake. CT scan of the abdomen pelvis was unrevealing but  it did show some ascites and pleural effusion. Stool pathogens remain negative.  Her to thrive/hypoalbuminemia/severe protein caloric malnutrition: Diarrhea might be playing a role in her significant weight loss feeding tube was placed on 05/23/2020.  End-stage renal disease: Renal was consulted and she was started on dialysis.  Accessible atrial fibrillation: She is not on anticoagulation due to GI bleed. She into RVR she was started on amiodarone then transition to metoprolol with rate control.  Acute thrombocytopenia: Not on anticoagulation due to low platelets.  PAD: Goals of care/ethics: After having a long discussion with the husband about her multiple comorbidities and dementia it was explained to him that she will probably not survive this hospitalization.  He decided to move towards Full comfort care he wanted all lab stopped and medications that were not used for comfort stopped. She was started on morphine core track was discontinued along with telemetry. She will be transferred to residential hospice facility for comfort measures.  Discharge Instructions  Discharge Instructions    Diet - low sodium heart healthy   Complete by: As directed    Increase activity slowly   Complete by: As directed    No wound care   Complete by: As directed      Allergies as of 05/29/2020      Reactions   Cefazolin Other (See Comments)   Severe thrombocytopenia (has tolerated zosyn in the past) ID aware Tolerated in October 2019   Cephalosporins Anaphylaxis   Tuberculin Swelling, Other (See Comments)   Site of swelling not known      Medication List    STOP taking these medications   albuterol 108 (90 Base) MCG/ACT inhaler Commonly known as: VENTOLIN HFA  b complex-vitamin c-folic acid 0.8 MG Tabs tablet   calcium acetate 667 MG capsule Commonly known as: PHOSLO   cinacalcet 30 MG tablet Commonly known as: SENSIPAR   clopidogrel 75 MG tablet Commonly known as:  Plavix   DIALYVITE 800 WITH ZINC 0.8 MG Tabs   ferric citrate 1 GM 210 MG(Fe) tablet Commonly known as: AURYXIA   lidocaine-prilocaine cream Commonly known as: EMLA   metoprolol tartrate 25 MG tablet Commonly known as: LOPRESSOR   mirtazapine 7.5 MG tablet Commonly known as: REMERON   nicotine 21 mg/24hr patch Commonly known as: NICODERM CQ - dosed in mg/24 hours   pantoprazole 40 MG tablet Commonly known as: PROTONIX   rosuvastatin 10 MG tablet Commonly known as: Crestor   triamcinolone 0.1 % Commonly known as: KENALOG     TAKE these medications   LORazepam 2 MG/ML concentrated solution Commonly known as: ATIVAN Place 0.5 mLs (1 mg total) under the tongue every 4 (four) hours as needed for anxiety.   morphine 4 MG/ML injection Inject 1 mL (4 mg total) into the vein every 3 (three) hours as needed (SOB).       Allergies  Allergen Reactions  . Cefazolin Other (See Comments)    Severe thrombocytopenia (has tolerated zosyn in the past) ID aware Tolerated in October 2019  . Cephalosporins Anaphylaxis  . Tuberculin Swelling and Other (See Comments)    Site of swelling not known    Consultations:  Cardiology  Nephrology  Gastroenterology   Procedures/Studies: CT ABDOMEN PELVIS W CONTRAST  Result Date: 05/23/2020 CLINICAL DATA:  Rectal bleeding for 1 week, end-stage renal disease EXAM: CT ABDOMEN AND PELVIS WITH CONTRAST TECHNIQUE: Multidetector CT imaging of the abdomen and pelvis was performed using the standard protocol following bolus administration of intravenous contrast. CONTRAST:  18mL OMNIPAQUE IOHEXOL 300 MG/ML  SOLN COMPARISON:  09/29/2018 FINDINGS: Lower chest: There are small bilateral pleural effusions, left greater than right. Compressive atelectasis is seen within the bilateral lower lobes. The heart is enlarged without pericardial effusion. Marked calcification of the mitral annulus and aortic valve. Hepatobiliary: No focal liver abnormality  is seen. No gallstones, gallbladder wall thickening, or biliary dilatation. Pancreas: Unremarkable. No pancreatic ductal dilatation or surrounding inflammatory changes. Spleen: Normal in size without focal abnormality. Adrenals/Urinary Tract: There is marked bilateral renal atrophy consistent with end-stage renal disease. Bladder is completely decompressed. Adrenals are unremarkable. Stomach/Bowel: Enteric catheter tip within the lumen of the gastric antrum. The stomach is decompressed, with nonspecific thickening of the rugal folds. Findings could reflect gastritis. No bowel obstruction or ileus. Normal appendix right lower quadrant. Vascular/Lymphatic: Extensive atherosclerosis of the aorta and its branches. No pathologic adenopathy. Reproductive: Status post hysterectomy. No adnexal masses. Other: Large volume of simple ascites throughout the abdomen and pelvis. No free intraperitoneal gas. No abdominal wall hernia. Musculoskeletal: The patient is cachectic. There is diffuse anasarca. No acute or destructive bony lesions. Reconstructed images demonstrate no additional findings. IMPRESSION: 1. Large volume simple ascites throughout the abdomen and pelvis. 2. Small bilateral pleural effusions, left greater than right. 3. Nonspecific thickening of the rugal folds, which could reflect gastritis. No bowel obstruction or ileus. 4. Atrophic kidneys consistent with end-stage renal disease. 5. Aortic Atherosclerosis (ICD10-I70.0). Electronically Signed   By: Randa Ngo M.D.   On: 05/23/2020 17:12   DG Chest Port 1 View  Result Date: 05/21/2020 CLINICAL DATA:  Shortness of breath EXAM: PORTABLE CHEST 1 VIEW COMPARISON:  May 19, 2020 FINDINGS: There is ill-defined airspace opacity  in the left mid lung and left base regions and more subtly in the right lower lung region. No consolidation. There is left base atelectasis. The heart is upper normal in size with pulmonary vascularity normal. No adenopathy. There is  aortic atherosclerosis. No bone lesions. There is calcification in each carotid artery. IMPRESSION: Ill-defined airspace opacity in the left mid lung and left base regions and to a lesser degree in the right lower lung region. Appearance concerning for multifocal pneumonia. Advise correlation with COVID-19 status in this regard. There is also atelectasis in the left base. Stable cardiac silhouette.  No adenopathy. Aortic Atherosclerosis (ICD10-I70.0). Electronically Signed   By: Lowella Grip III M.D.   On: 05/21/2020 08:13   DG Chest Port 1 View  Result Date: 05/19/2020 CLINICAL DATA:  History of GI bleed and missed dialysis session EXAM: PORTABLE CHEST 1 VIEW COMPARISON:  10/13/2019 FINDINGS: Cardiac shadow is within normal limits. Aortic calcifications are seen. The lungs are clear bilaterally. No bony abnormality is seen. IMPRESSION: No active disease. Electronically Signed   By: Inez Catalina M.D.   On: 05/19/2020 11:55    (Echo, Carotid, EGD, Colonoscopy, ERCP)    Subjective: No complains  Discharge Exam: Vitals:   05/28/20 0600 05/28/20 1937  BP:  96/68  Pulse: 96 (!) 117  Resp: (!) 25 (!) 27  Temp:  99.7 F (37.6 C)  SpO2: 100% 98%   Vitals:   05/28/20 0400 05/28/20 0500 05/28/20 0600 05/28/20 1937  BP: 97/76   96/68  Pulse:  93 96 (!) 117  Resp: (!) 27 (!) 26 (!) 25 (!) 27  Temp: 98.5 F (36.9 C)   99.7 F (37.6 C)  TempSrc: Oral   Oral  SpO2: 100% 100% 100% 98%  Weight: 53.8 kg     Height:        General: Pt is alert, awake, not in acute distress Cardiovascular: RRR, S1/S2 +, no rubs, no gallops Respiratory: CTA bilaterally, no wheezing, no rhonchi Abdominal: Soft, NT, ND, bowel sounds + Extremities: no edema, no cyanosis    The results of significant diagnostics from this hospitalization (including imaging, microbiology, ancillary and laboratory) are listed below for reference.     Microbiology: Recent Results (from the past 240 hour(s))  Resp Panel by  RT-PCR (Flu A&B, Covid) Nasopharyngeal Swab     Status: None   Collection Time: 05/19/20 10:57 AM   Specimen: Nasopharyngeal Swab; Nasopharyngeal(NP) swabs in vial transport medium  Result Value Ref Range Status   SARS Coronavirus 2 by RT PCR NEGATIVE NEGATIVE Final    Comment: (NOTE) SARS-CoV-2 target nucleic acids are NOT DETECTED.  The SARS-CoV-2 RNA is generally detectable in upper respiratory specimens during the acute phase of infection. The lowest concentration of SARS-CoV-2 viral copies this assay can detect is 138 copies/mL. A negative result does not preclude SARS-Cov-2 infection and should not be used as the sole basis for treatment or other patient management decisions. A negative result may occur with  improper specimen collection/handling, submission of specimen other than nasopharyngeal swab, presence of viral mutation(s) within the areas targeted by this assay, and inadequate number of viral copies(<138 copies/mL). A negative result must be combined with clinical observations, patient history, and epidemiological information. The expected result is Negative.  Fact Sheet for Patients:  EntrepreneurPulse.com.au  Fact Sheet for Healthcare Providers:  IncredibleEmployment.be  This test is no t yet approved or cleared by the [place] FDA and  has been authorized for detection and/or diagnosis of  SARS-CoV-2 by FDA under an Emergency Use Authorization (EUA). This EUA will remain  in effect (meaning this test can be used) for the duration of the COVID-19 declaration under Section 564(b)(1) of the Act, 21 U.S.C.section 360bbb-3(b)(1), unless the authorization is terminated  or revoked sooner.       Influenza A by PCR NEGATIVE NEGATIVE Final   Influenza B by PCR NEGATIVE NEGATIVE Final    Comment: (NOTE) The Xpert Xpress SARS-CoV-2/FLU/RSV plus assay is intended as an aid in the diagnosis of influenza from Nasopharyngeal swab  specimens and should not be used as a sole basis for treatment. Nasal washings and aspirates are unacceptable for Xpert Xpress SARS-CoV-2/FLU/RSV testing.  Fact Sheet for Patients: EntrepreneurPulse.com.au  Fact Sheet for Healthcare Providers: IncredibleEmployment.be  This test is not yet approved or cleared by the [place] FDA and has been authorized for detection and/or diagnosis of SARS-CoV-2 by FDA under an Emergency Use Authorization (EUA). This EUA will remain in effect (meaning this test can be used) for the duration of the COVID-19 declaration under Section 564(b)(1) of the Act, 21 U.S.C. section 360bbb-3(b)(1), unless the authorization is terminated or revoked.  Performed at [facility], [place] [address], [place], [place] [address]   C Difficile Quick Screen w PCR reflex     Status: Abnormal   Collection Time: 05/19/20  5:47 PM   Specimen: STOOL  Result Value Ref Range Status   C Diff antigen POSITIVE (A) NEGATIVE Final   C Diff toxin NEGATIVE NEGATIVE Final   C Diff interpretation Results are indeterminate. See PCR results.  Final    Comment: Performed at [facility], [place] [address], [place], [place] [address]  Gastrointestinal Panel by PCR , Stool     Status: None   Collection Time: 05/19/20  5:47 PM   Specimen: STOOL  Result Value Ref Range Status   Campylobacter species NOT DETECTED NOT DETECTED Final   Plesimonas shigelloides NOT DETECTED NOT DETECTED Final   Salmonella species NOT DETECTED NOT DETECTED Final   Yersinia enterocolitica NOT DETECTED NOT DETECTED Final   Vibrio species NOT DETECTED NOT DETECTED Final   Vibrio cholerae NOT DETECTED NOT DETECTED Final   Enteroaggregative E coli (EAEC) NOT DETECTED NOT DETECTED Final   Enteropathogenic E coli (EPEC) NOT DETECTED NOT DETECTED Final   Enterotoxigenic E coli (ETEC) NOT DETECTED NOT DETECTED Final   Shiga like toxin producing E coli (STEC) NOT DETECTED  NOT DETECTED Final   Shigella/Enteroinvasive E coli (EIEC) NOT DETECTED NOT DETECTED Final   Cryptosporidium NOT DETECTED NOT DETECTED Final   Cyclospora cayetanensis NOT DETECTED NOT DETECTED Final   Entamoeba histolytica NOT DETECTED NOT DETECTED Final   Giardia lamblia NOT DETECTED NOT DETECTED Final   Adenovirus F40/41 NOT DETECTED NOT DETECTED Final   Astrovirus NOT DETECTED NOT DETECTED Final   Norovirus GI/GII NOT DETECTED NOT DETECTED Final   Rotavirus A NOT DETECTED NOT DETECTED Final   Sapovirus (I, II, IV, and V) NOT DETECTED NOT DETECTED Final    Comment: Performed at [facility], [address] [place], [place] [address]  C. Diff by PCR, Reflexed     Status: None   Collection Time: 05/19/20  5:47 PM  Result Value Ref Range Status   Toxigenic C. Difficile by PCR NEGATIVE NEGATIVE Final    Comment: Patient is colonized with non toxigenic C. difficile. May not need treatment unless significant symptoms are present. Performed at [facility], [place]  [address] [place], [place] [address]   Culture, blood (routine x 2)     Status: None   Collection Time: 05/24/20  8:20 PM   Specimen: BLOOD LEFT FOREARM  Result Value Ref Range Status   Specimen Description BLOOD LEFT FOREARM  Final   Special Requests   Final    BOTTLES DRAWN AEROBIC AND ANAEROBIC Blood Culture results may not be optimal due to an inadequate volume of blood received in culture bottles   Culture   Final    NO GROWTH 5 DAYS Performed at [facility], [place] [address], [place], [place] [address]    Report Status 05/29/2020 FINAL  Final  Culture, blood (routine x 2)     Status: None   Collection Time: 05/24/20  8:25 PM   Specimen: BLOOD LEFT HAND  Result Value Ref Range Status   Specimen Description BLOOD LEFT HAND  Final   Special Requests   Final    BOTTLES DRAWN AEROBIC ONLY Blood Culture results may not be optimal due to an inadequate volume of blood received in culture bottles   Culture    Final    NO GROWTH 5 DAYS Performed at [facility], [place] [address], [place], [place] [address]    Report Status 05/29/2020 FINAL  Final     Labs: BNP (last 3 results) No results for input(s): BNP in the last 8760 hours. Basic Metabolic Panel: Recent Labs  Lab 05/22/20 0909 05/22/20 1543 05/23/20 1233 05/23/20 2214 05/24/20 0226 05/24/20 1651 05/25/20 0337 05/26/20 0203 05/27/20 1059 05/28/20 0346  NA 133*   < > 133* 129* 127* 128* 126* 131* 131* 129*  K 2.4*   < > 2.6* 3.6 3.6 4.2 4.1 3.9 4.5 4.9  CL 102   < > 101 97* 97* 101 99 100 99 99  CO2 19*   < > 22 22 21* 19* 19* 21* 22 20*  GLUCOSE 98   < > 93 114* 129* 94 100* 96 86 101*  BUN 13   < > 5* 7 9 14 19 15  34* 44*  CREATININE 5.58*   < > 3.43* 3.69* 3.86* 4.32* 4.65* 3.06* 4.02* 4.41*  CALCIUM 7.6*   < > 7.7* 7.9* 7.9* 7.7* 7.4* 7.3* 7.4* 7.4*  MG  --   --  1.5* 1.5* 2.2 1.9  --   --   --   --   PHOS 2.5  --   --   --  1.5*  --   --   --  2.4* 2.7   < > = values in this interval not displayed.   Liver Function Tests: Recent Labs  Lab 05/22/20 0909 05/22/20 1543 05/24/20 0226 05/27/20 1059 05/28/20 0346  AST  --  19  --   --   --   ALT  --  12  --   --   --   ALKPHOS  --  61  --   --   --   BILITOT  --  1.2  --   --   --   PROT  --  4.9*  --   --   --   ALBUMIN 1.8* 2.0* 2.6* 1.9* 1.8*   No results for input(s): LIPASE, AMYLASE in the last 168 hours. No results for input(s): AMMONIA in the last 168 hours. CBC: Recent Labs  Lab 05/22/20 0903 05/22/20 2118 05/23/20 0105 05/25/20 0337 05/25/20 1639 05/27/20 1059  WBC 7.8  --  8.6 8.8 6.4 6.1  HGB 11.5* 12.2 12.8 10.2* 8.8* 10.9*  HCT 34.2* 36.5 36.2 30.8* 25.4* 32.8*  MCV 82.6  --  82.1 83.9 82.7 83.9  PLT 59*  --  58* 61* 63* 115*   Cardiac Enzymes: No results for input(s): CKTOTAL, CKMB, CKMBINDEX, TROPONINI in the last 168 hours. BNP: Invalid input(s): POCBNP CBG: Recent Labs  Lab 05/27/20 1620 05/27/20 2041 05/28/20 0018  05/28/20 0405 05/28/20 0741  GLUCAP 83 87 92 103* 96   D-Dimer No results for input(s): DDIMER in the last 72 hours. Hgb A1c No results for input(s): HGBA1C in the last 72 hours. Lipid Profile No results for input(s): CHOL, HDL, LDLCALC, TRIG, CHOLHDL, LDLDIRECT in the last 72 hours. Thyroid function studies No results for input(s): TSH, T4TOTAL, T3FREE, THYROIDAB in the last 72 hours.  Invalid input(s): FREET3 Anemia work up No results for input(s): VITAMINB12, FOLATE, FERRITIN, TIBC, IRON, RETICCTPCT in the last 72 hours. Urinalysis    Component Value Date/Time   COLORURINE YELLOW 11/26/2013 1819   APPEARANCEUR TURBID (A) 11/26/2013 1819   LABSPEC 1.021 11/26/2013 1819   PHURINE 5.5 11/26/2013 1819   GLUCOSEU NEGATIVE 11/26/2013 1819   HGBUR SMALL (A) 11/26/2013 1819   BILIRUBINUR NEGATIVE 11/26/2013 1819   KETONESUR NEGATIVE 11/26/2013 1819   PROTEINUR >300 (A) 11/26/2013 1819   UROBILINOGEN 0.2 11/26/2013 1819   NITRITE POSITIVE (A) 11/26/2013 1819   LEUKOCYTESUR MODERATE (A) 11/26/2013 1819   Sepsis Labs Invalid input(s): PROCALCITONIN,  WBC,  LACTICIDVEN Microbiology Recent Results (from the past 240 hour(s))  Resp Panel by RT-PCR (Flu A&B, Covid) Nasopharyngeal Swab     Status: None   Collection Time: 05/19/20 10:57 AM   Specimen: Nasopharyngeal Swab; Nasopharyngeal(NP) swabs in vial transport medium  Result Value Ref Range Status   SARS Coronavirus 2 by RT PCR NEGATIVE NEGATIVE Final    Comment: (NOTE) SARS-CoV-2 target nucleic acids are NOT DETECTED.  The SARS-CoV-2 RNA is generally detectable in upper respiratory specimens during the acute phase of infection. The lowest concentration of SARS-CoV-2 viral copies this assay can detect is 138 copies/mL. A negative result does not preclude SARS-Cov-2 infection and should not be used as the sole basis for treatment or other patient management decisions. A negative result may occur with  improper specimen  collection/handling, submission of specimen other than nasopharyngeal swab, presence of viral mutation(s) within the areas targeted by this assay, and inadequate number of viral copies(<138 copies/mL). A negative result must be combined with clinical observations, patient history, and epidemiological information. The expected result is Negative.  Fact Sheet for Patients:  EntrepreneurPulse.com.au  Fact Sheet for Healthcare Providers:  IncredibleEmployment.be  This test is no t yet approved or cleared by the [place] FDA and  has been authorized for detection and/or diagnosis of SARS-CoV-2 by FDA under an Emergency Use Authorization (EUA). This EUA will remain  in effect (meaning this test can be used) for the duration of the COVID-19 declaration under Section 564(b)(1) of the Act, 21 U.S.C.section 360bbb-3(b)(1), unless the authorization is terminated  or revoked sooner.       Influenza A by PCR NEGATIVE NEGATIVE Final   Influenza B by PCR NEGATIVE NEGATIVE Final    Comment: (NOTE) The Xpert Xpress SARS-CoV-2/FLU/RSV plus assay is intended as an aid in the diagnosis of influenza from Nasopharyngeal swab specimens and should not be used as a sole basis for treatment. Nasal washings and aspirates are unacceptable for Xpert Xpress SARS-CoV-2/FLU/RSV testing.  Fact Sheet for Patients: EntrepreneurPulse.com.au  Fact Sheet  for Healthcare Providers: IncredibleEmployment.be  This test is not yet approved or cleared by the [place] and has been authorized for detection and/or diagnosis of SARS-CoV-2 by FDA under an Emergency Use Authorization (EUA). This EUA will remain in effect (meaning this test can be used) for the duration of the COVID-19 declaration under Section 564(b)(1) of the Act, 21 U.S.C. section 360bbb-3(b)(1), unless the authorization is terminated or revoked.  Performed at [facility], [place] [address] [place], [place] [address]   C Difficile Quick Screen w PCR reflex     Status: Abnormal   Collection Time: 05/19/20  5:47 PM   Specimen: STOOL  Result Value Ref Range Status   C Diff antigen POSITIVE (A) NEGATIVE Final   C Diff toxin NEGATIVE NEGATIVE Final   C Diff interpretation Results are indeterminate. See PCR results.  Final    Comment: Performed at [facility], [place] [address], [place], [place] [address]  Gastrointestinal Panel by PCR , Stool     Status: None   Collection Time: 05/19/20  5:47 PM   Specimen: STOOL  Result Value Ref Range Status   Campylobacter species NOT DETECTED NOT DETECTED Final   Plesimonas shigelloides NOT DETECTED NOT DETECTED Final   Salmonella species NOT DETECTED NOT DETECTED Final   Yersinia enterocolitica NOT DETECTED NOT DETECTED Final   Vibrio species NOT DETECTED NOT DETECTED Final   Vibrio cholerae NOT DETECTED NOT DETECTED Final   Enteroaggregative E coli (EAEC) NOT DETECTED NOT DETECTED Final   Enteropathogenic E coli (EPEC) NOT DETECTED NOT DETECTED Final   Enterotoxigenic E coli (ETEC) NOT DETECTED NOT DETECTED Final   Shiga like toxin producing E coli (STEC) NOT DETECTED NOT DETECTED Final   Shigella/Enteroinvasive E coli (EIEC) NOT DETECTED NOT DETECTED Final   Cryptosporidium NOT DETECTED NOT DETECTED Final   Cyclospora cayetanensis NOT DETECTED NOT DETECTED Final   Entamoeba histolytica NOT DETECTED NOT DETECTED Final   Giardia lamblia NOT DETECTED NOT DETECTED Final   Adenovirus F40/41 NOT DETECTED NOT DETECTED Final   Astrovirus NOT DETECTED NOT DETECTED Final   Norovirus GI/GII NOT DETECTED NOT DETECTED Final   Rotavirus A NOT DETECTED NOT DETECTED Final   Sapovirus (I, II, IV, and V) NOT DETECTED NOT DETECTED Final    Comment: Performed at [facility], [place] [place], [place] [address]  C. Diff by PCR, Reflexed     Status: None   Collection Time: 05/19/20  5:47 PM  Result  Value Ref Range Status   Toxigenic C. Difficile by PCR NEGATIVE NEGATIVE Final    Comment: Patient is colonized with non toxigenic C. difficile. May not need treatment unless significant symptoms are present. Performed at [facility], [place] [address], [place], [place] [address]   Culture, blood (routine x 2)     Status: None   Collection Time: 05/24/20  8:20 PM   Specimen: BLOOD LEFT FOREARM  Result Value Ref Range Status   Specimen Description BLOOD LEFT FOREARM  Final   Special Requests   Final    BOTTLES DRAWN AEROBIC AND ANAEROBIC Blood Culture results may not be optimal due to an inadequate volume of blood received in culture bottles   Culture   Final    NO GROWTH 5 DAYS Performed at [facility], [place] [address], [place], [place] [address]    Report Status 05/29/2020 FINAL  Final  Culture, blood (routine x 2)  Status: None   Collection Time: 05/24/20  8:25 PM   Specimen: BLOOD LEFT HAND  Result Value Ref Range Status   Specimen Description BLOOD LEFT HAND  Final   Special Requests   Final    BOTTLES DRAWN AEROBIC ONLY Blood Culture results may not be optimal due to an inadequate volume of blood received in culture bottles   Culture   Final    NO GROWTH 5 DAYS Performed at [facility], [place] [address], [place], [place] [address]    Report Status 05/29/2020 FINAL  Final     Time coordinating discharge: Over 40 minutes  SIGNED:   Charlynne Cousins, MD  [facility] 05/29/2020, 7:15 AM Pager   If 7PM-7AM, please contact night-coverage [email or web address] Password [email or web address]

## 2020-05-29 NOTE — Progress Notes (Signed)
[facility] ([facility]) Hospital Liaison note.     [facility] is unable to offer a room today. Hospital Liaison will follow up tomorrow or sooner if a room becomes available and eligibility is confirmed.    A Please do not hesitate to call with questions.     Thank you,    Mary Anne Robertson, RN, CCM       [facility] Hospital Liaison (listed on AMION under Hospice /[facility])     [phone]  

## 2020-05-29 NOTE — Progress Notes (Signed)
CSW contacted [facility]. No beds available today. Pt is next on list. Likely bed available tomorrow.

## 2020-05-29 NOTE — Progress Notes (Signed)
Pt husband, Yehuda Savannah, communicated with this RN that he now wishes to take pt home with hospice care if possible he DOES NOT wish for the pt to go to [facility] like originally planned. He ask for assistance to make this happen if possible.

## 2020-05-30 DIAGNOSIS — I5032 Chronic diastolic (congestive) heart failure: Secondary | ICD-10-CM | POA: Diagnosis not present

## 2020-05-30 DIAGNOSIS — K922 Gastrointestinal hemorrhage, unspecified: Secondary | ICD-10-CM | POA: Diagnosis not present

## 2020-05-30 DIAGNOSIS — N186 End stage renal disease: Secondary | ICD-10-CM | POA: Diagnosis not present

## 2020-05-30 MED ORDER — MORPHINE SULFATE (PF) 4 MG/ML IV SOLN: 4.0000 mg | INTRAVENOUS | 0 refills | Status: AC | PRN

## 2020-05-30 MED ORDER — LORAZEPAM 2 MG/ML PO CONC: 1.0000 mg | ORAL | 0 refills | Status: AC | PRN

## 2020-05-30 NOTE — Progress Notes (Signed)
Patient resting in bed with eyes closed, will open them in response to voice. She denies pain at this time by shaking her head no, and breathing is calm and within normal RR limits. Lung sounds are clear, she is still getting good oxygenation. Bowel sounds are hypoactive, abdomen soft. Rectal tube still in place draining liquid stool. She is exhibiting anasarca/third spacing on all limbs at this time. Will continue to reposition and ensure PRN meds given for comfort as indicated.

## 2020-05-30 NOTE — Progress Notes (Signed)
[facility] PROGRESS NOTE    Progress Note  Karen Morrow  EBR:[id number] DOB: 13-Oct-1960 DOA: 05/19/2020 PCP: [name], MD     Brief Narrative:   Karen Morrow is an 59 y.o. female end-stage renal disease with dialyzes Tuesday Thursdays and Saturdays, A. fib not on anticoagulation due to GI bleed, essential hypertension chronic diastolic heart failure peripheral vascular disease on Plavix who presents with 1 week of rectal bleeding, in the ER was found to be hypotensive and hypoglycemic with a hemoglobin of 7.6 he was given 2 units of packed red blood cells GI was consulted.  Assessment/Plan:   GI bleed: No further bloody stools in house. Patient husband decided to move to a full comfort care, he would like her to be transferred to a residential hospice facility. Nonverbal and sleepy this morning probably becoming encephalopathic from her uremia.  Refractory watery diarrhea: Moving towards comfort care. Leave Flexi-Seal in until discharge.  Failure to thrive/hypoalbuminemia/severe protein caloric malnutrition: Husband decided to move towards comfort care.  End-stage renal disease: Dialysis stopped.  Hypokalemia: Hypervolemic hyponatremia: Hypomagnesemia: Anemia of chronic disease versus GI loss: Paroxysmal atrial fibrillation: Acute thrombocytopenia: PAD: Hepatic cirrhosis: Dementia without behavioral disturbances: Goals of care/ethics: After having a long discussion with the husband he decided to move towards comfort care, he does not want any labs drawn, he would like to stop dialysis. We will discontinue the core track, telemetry and continue the rectal tube. We will use morphine for pain or shortness of breath and Ativan for anxiety or agitation.  RN Pressure Injury Documentation: Pressure Injury 05/26/20 Ischial tuberosity Posterior;Proximal;Right Stage 1 -  Intact skin with non-blanchable redness of a localized area usually over a bony  prominence. nonblancheable redness, irregular shape (Active)  05/26/20 1030  Location: Ischial tuberosity  Location Orientation: Posterior;Proximal;Right  Staging: Stage 1 -  Intact skin with non-blanchable redness of a localized area usually over a bony prominence.  Wound Description (Comments): nonblancheable redness, irregular shape  Present on Admission: No     Pressure Injury 05/26/20 Lumbar Lateral;Right Stage 1 -  Intact skin with non-blanchable redness of a localized area usually over a bony prominence. nonblancheable, irregular shape (Active)  05/26/20 1030  Location: Lumbar  Location Orientation: Lateral;Right  Staging: Stage 1 -  Intact skin with non-blanchable redness of a localized area usually over a bony prominence.  Wound Description (Comments): nonblancheable, irregular shape  Present on Admission: No     Pressure Injury 05/26/20 Abdomen Right;Upper (Active)  05/26/20 1030  Location: Abdomen  Location Orientation: Right;Upper  Staging:   Wound Description (Comments):   Present on Admission:      DVT prophylaxis: SCD Family Communication:husband Status is: Inpatient  Remains inpatient appropriate because:Hemodynamically unstable   Dispo: The patient is from: Home              Anticipated d/c is to: Home              Anticipated d/c date is: > 3 days              Patient currently is not medically stable to d/c.        Code Status:     Code Status Orders  (From admission, onward)         Start     Ordered   05/25/20 0844  Do not attempt resuscitation (DNR)  Continuous       Question Answer Comment  In the event of cardiac or respiratory ARREST  Do not call a "code blue"   In the event of cardiac or respiratory ARREST Do not perform Intubation, CPR, defibrillation or ACLS   In the event of cardiac or respiratory ARREST Use medication by any route, position, wound care, and other measures to relive pain and suffering. May use oxygen, suction and  manual treatment of airway obstruction as needed for comfort.      05/25/20 0843        Code Status History    Date Active Date Inactive Code Status Order ID Comments User Context   05/20/2020 0125 05/25/2020 0843 Full Code [id number]  Deland Pretty, MD ED   01/21/2020 1409 01/21/2020 2122 Full Code [id number]  Waynetta Sandy, MD Inpatient   10/13/2019 1741 10/16/2019 1512 Full Code [id number]  Mikki Harbor, MD ED   08/01/2019 0937 08/05/2019 1926 Full Code [id number]  Karmen Bongo, MD ED   11/27/2018 0515 11/27/2018 2302 Full Code [id number]  Ivor Costa, MD ED   10/05/2018 1830 10/07/2018 1911 Full Code [id number]  Hosie Poisson, MD Inpatient   09/29/2018 1536 10/01/2018 1540 Full Code [id number]  Norval Morton, MD ED   09/15/2018 2358 09/19/2018 1716 Full Code [id number]  Bernadette Hoit, DO ED   05/13/2018 0538 05/16/2018 1151 Full Code [id number]  Etta Quill, DO ED   03/10/2018 0831 03/19/2018 1837 Full Code [id number]  Lorella Nimrod, MD ED   09/07/2016 0200 09/09/2016 1754 Full Code [id number]  Rise Patience, MD ED   11/27/2013 1148 12/07/2013 1232 Full Code [id number]  Markus Daft, MD Inpatient   11/26/2013 1853 11/27/2013 1148 Full Code [id number]  Shanda Howells, MD ED   06/05/2013 1555 06/07/2013 1450 Full Code [id number]  Velvet Bathe, MD Inpatient   11/03/2012 1008 11/05/2012 1259 Full Code [id number]  Robbie Lis, MD Inpatient   Advance Care Planning Activity        IV Access:    Peripheral IV   Procedures and diagnostic studies:   No results found.   Medical Consultants:    None.  Anti-Infectives:   none  Subjective:    Karen Morrow nonverbal and sleepy this morning.  Objective:    Vitals:   05/28/20 0600 05/28/20 1937 05/29/20 1706 05/29/20 1924  BP:  96/68 (!) 77/64 98/79  Pulse: 96 (!) 117 (!) 128 (!) 110  Resp: (!) 25 (!) 27  16  Temp:  99.7 F (37.6 C) 97.7 F (36.5 C) 98.1 F (36.7 C)  TempSrc:  Oral Oral Oral  SpO2: 100%  98% 97% 100%  Weight:      Height:       SpO2: 100 %   Intake/Output Summary (Last 24 hours) at 05/30/2020 0830 Last data filed at 05/29/2020 1753 Gross per 24 hour  Intake 0 ml  Output --  Net 0 ml   Filed Weights   05/26/20 0350 05/27/20 0555 05/28/20 0400  Weight: 52 kg 53.1 kg 53.8 kg    Exam: General exam: In no acute distress. Respiratory system: Good air movement and clear to auscultation. Cardiovascular system: S1 & S2 heard, RRR. No JVD. Gastrointestinal system: Abdomen is nondistended, soft and nontender.  Extremities: No pedal edema. Skin: No rashes, lesions or ulcers    Labs: Basic Metabolic Panel: Recent Labs  Lab 05/23/20 1233 05/23/20 2214 05/24/20 0226 05/24/20 1651 05/25/20 0337 05/26/20 0203 05/27/20 1059 05/28/20 0346  NA 133* 129* 127* 128* 126* 131* 131* 129*  K 2.6* 3.6 3.6 4.2 4.1 3.9 4.5 4.9  CL 101 97* 97* 101 99 100 99 99  CO2 22 22 21* 19* 19* 21* 22 20*  GLUCOSE 93 114* 129* 94 100* 96 86 101*  BUN 5* 7 9 14 19 15  34* 44*  CREATININE 3.43* 3.69* 3.86* 4.32* 4.65* 3.06* 4.02* 4.41*  CALCIUM 7.7* 7.9* 7.9* 7.7* 7.4* 7.3* 7.4* 7.4*  MG 1.5* 1.5* 2.2 1.9  --   --   --   --   PHOS  --   --  1.5*  --   --   --  2.4* 2.7   GFR Estimated Creatinine Clearance: 11.5 mL/min (A) (by C-G formula based on SCr of 4.41 mg/dL (H)). Liver Function Tests: Recent Labs  Lab 05/24/20 0226 05/27/20 1059 05/28/20 0346  ALBUMIN 2.6* 1.9* 1.8*   No results for input(s): LIPASE, AMYLASE in the last 168 hours. No results for input(s): AMMONIA in the last 168 hours. Coagulation profile No results for input(s): INR, PROTIME in the last 168 hours. COVID-19 Labs  No results for input(s): DDIMER, FERRITIN, LDH, CRP in the last 72 hours.  Lab Results  Component Value Date   SARSCOV2NAA NEGATIVE 05/19/2020   SARSCOV2NAA NEGATIVE 01/18/2020   [place] NEGATIVE 10/13/2019   [place] NEGATIVE 08/01/2019    CBC: Recent Labs  Lab  05/25/20 0337 05/25/20 1639 05/27/20 1059  WBC 8.8 6.4 6.1  HGB 10.2* 8.8* 10.9*  HCT 30.8* 25.4* 32.8*  MCV 83.9 82.7 83.9  PLT 61* 63* 115*   Cardiac Enzymes: No results for input(s): CKTOTAL, CKMB, CKMBINDEX, TROPONINI in the last 168 hours. BNP (last 3 results) No results for input(s): PROBNP in the last 8760 hours. CBG: Recent Labs  Lab 05/27/20 1620 05/27/20 2041 05/28/20 0018 05/28/20 0405 05/28/20 0741  GLUCAP 83 87 92 103* 96   D-Dimer: No results for input(s): DDIMER in the last 72 hours. Hgb A1c: No results for input(s): HGBA1C in the last 72 hours. Lipid Profile: No results for input(s): CHOL, HDL, LDLCALC, TRIG, CHOLHDL, LDLDIRECT in the last 72 hours. Thyroid function studies: No results for input(s): TSH, T4TOTAL, T3FREE, THYROIDAB in the last 72 hours.  Invalid input(s): FREET3 Anemia work up: No results for input(s): VITAMINB12, FOLATE, FERRITIN, TIBC, IRON, RETICCTPCT in the last 72 hours. Sepsis Labs: Recent Labs  Lab 05/25/20 0337 05/25/20 1639 05/27/20 1059  WBC 8.8 6.4 6.1   Microbiology Recent Results (from the past 240 hour(s))  Culture, blood (routine x 2)     Status: None   Collection Time: 05/24/20  8:20 PM   Specimen: BLOOD LEFT FOREARM  Result Value Ref Range Status   Specimen Description BLOOD LEFT FOREARM  Final   Special Requests   Final    BOTTLES DRAWN AEROBIC AND ANAEROBIC Blood Culture results may not be optimal due to an inadequate volume of blood received in culture bottles   Culture   Final    NO GROWTH 5 DAYS Performed at [facility], [place] [address] [place], [place] [address]    Report Status 05/29/2020 FINAL  Final  Culture, blood (routine x 2)     Status: None   Collection Time: 05/24/20  8:25 PM   Specimen: BLOOD LEFT HAND  Result Value Ref Range Status   Specimen Description BLOOD LEFT HAND  Final   Special Requests   Final    BOTTLES DRAWN AEROBIC ONLY Blood Culture results may not be optimal due to  an inadequate volume of blood received in culture bottles   Culture  Final    NO GROWTH 5 DAYS Performed at [facility], [place] [address], [place], [place] [address]    Report Status 05/29/2020 FINAL  Final     Medications:   . sodium chloride flush  3 mL Intravenous Q12H   Continuous Infusions: . sodium chloride        LOS: 10 days   Charlynne Cousins  [facility]  05/30/2020, 8:30 AM

## 2020-05-30 NOTE — Progress Notes (Addendum)
[facility] 3E-03 [facility] Liaison RN note:  [facility] able to offer a bed today. Services explained to Mr. Satchell via phone at [phone]. [facility] SW reaching out to Mr. Caliendo to complete consents. Once completed I will notify TOC for transportation to be arranged.  Please ensure DNR is on patient chart for transport. RN please call [phone] to give report to [facility].  Bedside RN and Tomi Bamberger, TOC updated.  Please call with any hospice related questions or concerns.  Thank you. Margaretmary Eddy, BSN, RN [facility] Liaison [phone]

## 2020-05-30 NOTE — Progress Notes (Signed)
Report called to [place] present to transport patient, spouse aware.

## 2020-05-30 NOTE — TOC Transition Note (Signed)
Transition of Care [facility]) - CM/SW Discharge Note   Patient Details  Name: Karen Morrow MRN: [id number] Date of Birth: [birth year]  Transition [facility] CM/SW Contact:  Zenon Mayo, RN Phone Number: 05/30/2020, 3:31 PM   Clinical Narrative:    Patient discharged to [facility] via [place], which husband decided if they have a bed today he prefer her to go to [facility].     Final next level of care: [place] Barriers to Discharge: No Barriers Identified   Patient Goals and CMS Choice        Discharge Placement                       Discharge Plan and Services                                     Social Determinants of Health (SDOH) Interventions     Readmission Risk Interventions Readmission Risk Prevention Plan 10/15/2019 10/07/2018 10/01/2018  Transportation Screening Complete Complete -  PCP or Specialist Appt within 3-5 Days Complete - -  HRI or [place] Patient refused - -  Social Work Consult for [place] Planning/Counseling Complete - -  [place] Screening Not Applicable - -  Medication Review Press photographer) Referral to Pharmacy Complete -  PCP or Specialist appointment within 3-5 days of discharge - Not Complete -  PCP/Specialist Appt Not Complete comments - unable to schedule appointment on weekend -  [place] or [place] - Complete Complete  SW Recovery Care/Counseling Consult - Complete -  [place] - Not Applicable -  Some recent data might be hidden

## 2020-05-30 NOTE — TOC Progression Note (Addendum)
Transition of Care [facility]) - Progression Note    Patient Details  Name: Karen Morrow MRN: [id number] Date of Birth: 11-10-1960  [facility]) CM/SW Contact  Zenon Mayo, RN Phone Number: 05/30/2020, 10:15 AM  Clinical Narrative:    NCM spoke with  Spouse in the room, he states if there is no bed at [facility] today , he would like wife to go home with hospice NCM offered choice he states he would like [facility].  NCM contacted Margaretmary Eddy with [facility], she is checking to see the bed availability with [facility] and will call this NCM back. Husband states they have no DME at home and will need hospital bed and bedside table at the least.  Per Olivia Mackie they have a bed at [facility] today, NCM informed husband, Olivia Mackie will be calling him at [phone].   Patient to go to [facility] today, NCM scheduled [facility] transport.       Expected Discharge Plan and Services           Expected Discharge Date: 05/29/20                                     Social Determinants of Health (SDOH) Interventions    Readmission Risk Interventions Readmission Risk Prevention Plan 10/15/2019 10/07/2018 10/01/2018  Transportation Screening Complete Complete -  PCP or Specialist Appt within 3-5 Days Complete - -  HRI or [place] Patient refused - -  Social Work Consult for [place] Planning/Counseling Complete - -  [place] Screening Not Applicable - -  Medication Review Press photographer) Referral to Pharmacy Complete -  PCP or Specialist appointment within 3-5 days of discharge - Not Complete -  PCP/Specialist Appt Not Complete comments - unable to schedule appointment on weekend -  [place] or [place] - Complete Complete  SW Recovery Care/Counseling Consult - Complete -  [place] - Not Applicable -  Some recent data might be hidden

## 2020-05-30 NOTE — Progress Notes (Signed)
Nutrition Brief Note  Chart reviewed. Pt now transitioning to comfort care.  No further nutrition interventions warranted at this time.  Please re-consult as needed.   Gabriana Wilmott W, RD, LDN, CDCES Registered Dietitian II Certified Diabetes Care and Education Specialist Please refer to AMION for RD and/or RD on-call/weekend/after hours pager  

## 2020-05-31 DIAGNOSIS — Z6824 Body mass index (BMI) 24.0-24.9, adult: Secondary | ICD-10-CM | POA: Diagnosis not present

## 2020-05-31 DIAGNOSIS — N186 End stage renal disease: Secondary | ICD-10-CM | POA: Diagnosis not present

## 2020-05-31 DIAGNOSIS — K922 Gastrointestinal hemorrhage, unspecified: Secondary | ICD-10-CM | POA: Diagnosis not present

## 2020-05-31 DIAGNOSIS — Z741 Need for assistance with personal care: Secondary | ICD-10-CM | POA: Diagnosis not present

## 2020-05-31 DIAGNOSIS — I132 Hypertensive heart and chronic kidney disease with heart failure and with stage 5 chronic kidney disease, or end stage renal disease: Secondary | ICD-10-CM | POA: Diagnosis not present

## 2020-05-31 DIAGNOSIS — F17210 Nicotine dependence, cigarettes, uncomplicated: Secondary | ICD-10-CM | POA: Diagnosis not present

## 2020-05-31 DIAGNOSIS — K219 Gastro-esophageal reflux disease without esophagitis: Secondary | ICD-10-CM | POA: Diagnosis not present

## 2020-05-31 DIAGNOSIS — B1920 Unspecified viral hepatitis C without hepatic coma: Secondary | ICD-10-CM | POA: Diagnosis not present

## 2020-05-31 DIAGNOSIS — I509 Heart failure, unspecified: Secondary | ICD-10-CM | POA: Diagnosis not present

## 2020-05-31 DIAGNOSIS — I739 Peripheral vascular disease, unspecified: Secondary | ICD-10-CM | POA: Diagnosis not present

## 2020-05-31 DIAGNOSIS — I4891 Unspecified atrial fibrillation: Secondary | ICD-10-CM | POA: Diagnosis not present

## 2020-05-31 DIAGNOSIS — R197 Diarrhea, unspecified: Secondary | ICD-10-CM | POA: Diagnosis not present

## 2020-06-01 DIAGNOSIS — N186 End stage renal disease: Secondary | ICD-10-CM | POA: Diagnosis not present

## 2020-06-01 DIAGNOSIS — I4891 Unspecified atrial fibrillation: Secondary | ICD-10-CM | POA: Diagnosis not present

## 2020-06-01 DIAGNOSIS — K922 Gastrointestinal hemorrhage, unspecified: Secondary | ICD-10-CM | POA: Diagnosis not present

## 2020-06-01 DIAGNOSIS — B1920 Unspecified viral hepatitis C without hepatic coma: Secondary | ICD-10-CM | POA: Diagnosis not present

## 2020-06-01 DIAGNOSIS — I132 Hypertensive heart and chronic kidney disease with heart failure and with stage 5 chronic kidney disease, or end stage renal disease: Secondary | ICD-10-CM | POA: Diagnosis not present

## 2020-06-01 DIAGNOSIS — I509 Heart failure, unspecified: Secondary | ICD-10-CM | POA: Diagnosis not present

## 2020-07-01 DEATH — deceased
